# Patient Record
Sex: Male | Born: 1940 | Race: White | Hispanic: No | Marital: Married | Smoking: Former smoker
Health system: Southern US, Community
[De-identification: ages and names within clinical notes are randomized; demographics above are authoritative.]

## PROBLEM LIST (undated history)

## (undated) DIAGNOSIS — G4733 Obstructive sleep apnea (adult) (pediatric): Secondary | ICD-10-CM

## (undated) DIAGNOSIS — I10 Essential (primary) hypertension: Secondary | ICD-10-CM

## (undated) DIAGNOSIS — R3915 Urgency of urination: Secondary | ICD-10-CM

## (undated) DIAGNOSIS — F419 Anxiety disorder, unspecified: Secondary | ICD-10-CM

## (undated) DIAGNOSIS — Z950 Presence of cardiac pacemaker: Secondary | ICD-10-CM

## (undated) DIAGNOSIS — I495 Sick sinus syndrome: Secondary | ICD-10-CM

## (undated) DIAGNOSIS — E785 Hyperlipidemia, unspecified: Secondary | ICD-10-CM

## (undated) DIAGNOSIS — C801 Malignant (primary) neoplasm, unspecified: Secondary | ICD-10-CM

## (undated) DIAGNOSIS — K635 Polyp of colon: Secondary | ICD-10-CM

## (undated) DIAGNOSIS — I2510 Atherosclerotic heart disease of native coronary artery without angina pectoris: Secondary | ICD-10-CM

## (undated) DIAGNOSIS — M79606 Pain in leg, unspecified: Secondary | ICD-10-CM

## (undated) DIAGNOSIS — I739 Peripheral vascular disease, unspecified: Secondary | ICD-10-CM

## (undated) DIAGNOSIS — M1990 Unspecified osteoarthritis, unspecified site: Secondary | ICD-10-CM

## (undated) DIAGNOSIS — Z9989 Dependence on other enabling machines and devices: Secondary | ICD-10-CM

## (undated) DIAGNOSIS — D649 Anemia, unspecified: Secondary | ICD-10-CM

## (undated) DIAGNOSIS — E7800 Pure hypercholesterolemia, unspecified: Secondary | ICD-10-CM

## (undated) DIAGNOSIS — D489 Neoplasm of uncertain behavior, unspecified: Secondary | ICD-10-CM

## (undated) DIAGNOSIS — E119 Type 2 diabetes mellitus without complications: Secondary | ICD-10-CM

## (undated) HISTORY — PX: TONSILLECTOMY AND ADENOIDECTOMY: SUR1326

## (undated) HISTORY — PX: KNEE SURGERY: SHX244

## (undated) HISTORY — DX: Anemia, unspecified: D64.9

## (undated) HISTORY — PX: TUMOR EXCISION: SHX421

## (undated) HISTORY — DX: Anxiety disorder, unspecified: F41.9

## (undated) HISTORY — DX: Essential (primary) hypertension: I10

## (undated) HISTORY — DX: Pain in leg, unspecified: M79.606

## (undated) HISTORY — DX: Atherosclerotic heart disease of native coronary artery without angina pectoris: I25.10

## (undated) HISTORY — DX: Malignant (primary) neoplasm, unspecified: C80.1

## (undated) HISTORY — DX: Obstructive sleep apnea (adult) (pediatric): G47.33

## (undated) HISTORY — PX: FEMORAL ARTERY STENT: SHX1583

## (undated) HISTORY — DX: Pure hypercholesterolemia, unspecified: E78.00

## (undated) HISTORY — DX: Unspecified osteoarthritis, unspecified site: M19.90

## (undated) HISTORY — DX: Sick sinus syndrome: I49.5

## (undated) HISTORY — DX: Peripheral vascular disease, unspecified: I73.9

## (undated) HISTORY — PX: COLONOSCOPY: SHX174

## (undated) HISTORY — PX: FRACTURE SURGERY: SHX138

## (undated) HISTORY — PX: CORONARY ANGIOPLASTY: SHX604

## (undated) HISTORY — DX: Polyp of colon: K63.5

## (undated) HISTORY — DX: Hyperlipidemia, unspecified: E78.5

## (undated) HISTORY — DX: Obstructive sleep apnea (adult) (pediatric): Z99.89

## (undated) HISTORY — PX: KNEE HARDWARE REMOVAL: SUR1128

---

## 1971-04-11 HISTORY — PX: FOOT FRACTURE SURGERY: SHX645

## 2002-06-09 HISTORY — PX: CARDIAC CATHETERIZATION: SHX172

## 2002-06-09 HISTORY — PX: CORONARY ARTERY BYPASS GRAFT: SHX141

## 2002-06-20 ENCOUNTER — Inpatient Hospital Stay (HOSPITAL_COMMUNITY): Admission: EM | Admit: 2002-06-20 | Discharge: 2002-06-30 | Payer: Self-pay | Admitting: Emergency Medicine

## 2002-06-22 ENCOUNTER — Encounter: Payer: Self-pay | Admitting: Cardiovascular Disease

## 2002-06-24 ENCOUNTER — Encounter: Payer: Self-pay | Admitting: Thoracic Surgery (Cardiothoracic Vascular Surgery)

## 2002-06-25 ENCOUNTER — Encounter: Payer: Self-pay | Admitting: Thoracic Surgery (Cardiothoracic Vascular Surgery)

## 2002-06-26 ENCOUNTER — Encounter: Payer: Self-pay | Admitting: Thoracic Surgery (Cardiothoracic Vascular Surgery)

## 2002-06-27 ENCOUNTER — Encounter: Payer: Self-pay | Admitting: Thoracic Surgery (Cardiothoracic Vascular Surgery)

## 2002-07-21 ENCOUNTER — Encounter (HOSPITAL_COMMUNITY): Admission: RE | Admit: 2002-07-21 | Discharge: 2002-10-19 | Payer: Self-pay | Admitting: Cardiovascular Disease

## 2002-07-24 ENCOUNTER — Encounter: Admission: RE | Admit: 2002-07-24 | Discharge: 2002-07-24 | Payer: Self-pay | Admitting: Cardiothoracic Surgery

## 2002-07-24 ENCOUNTER — Encounter: Payer: Self-pay | Admitting: Cardiothoracic Surgery

## 2002-10-30 ENCOUNTER — Encounter: Payer: Self-pay | Admitting: Cardiovascular Disease

## 2002-10-30 ENCOUNTER — Ambulatory Visit (HOSPITAL_COMMUNITY): Admission: RE | Admit: 2002-10-30 | Discharge: 2002-10-31 | Payer: Self-pay | Admitting: Cardiovascular Disease

## 2004-02-17 ENCOUNTER — Ambulatory Visit: Payer: Self-pay | Admitting: Family Medicine

## 2004-02-25 ENCOUNTER — Ambulatory Visit: Payer: Self-pay | Admitting: Family Medicine

## 2004-06-15 ENCOUNTER — Ambulatory Visit: Payer: Self-pay | Admitting: Family Medicine

## 2005-10-09 ENCOUNTER — Encounter: Admission: RE | Admit: 2005-10-09 | Discharge: 2005-10-09 | Payer: Self-pay | Admitting: *Deleted

## 2005-10-13 ENCOUNTER — Ambulatory Visit (HOSPITAL_COMMUNITY): Admission: RE | Admit: 2005-10-13 | Discharge: 2005-10-14 | Payer: Self-pay | Admitting: *Deleted

## 2005-10-13 HISTORY — PX: INSERT / REPLACE / REMOVE PACEMAKER: SUR710

## 2006-11-26 DIAGNOSIS — I10 Essential (primary) hypertension: Secondary | ICD-10-CM | POA: Insufficient documentation

## 2006-11-26 DIAGNOSIS — E118 Type 2 diabetes mellitus with unspecified complications: Secondary | ICD-10-CM | POA: Insufficient documentation

## 2006-11-26 DIAGNOSIS — Z794 Long term (current) use of insulin: Secondary | ICD-10-CM

## 2009-07-15 ENCOUNTER — Encounter (HOSPITAL_COMMUNITY): Admission: RE | Admit: 2009-07-15 | Discharge: 2009-09-08 | Payer: Self-pay | Admitting: Cardiovascular Disease

## 2009-09-29 ENCOUNTER — Encounter: Admission: RE | Admit: 2009-09-29 | Discharge: 2009-09-29 | Payer: Self-pay | Admitting: Family Medicine

## 2010-01-31 ENCOUNTER — Ambulatory Visit: Payer: Self-pay | Admitting: Family Medicine

## 2010-01-31 DIAGNOSIS — S86819A Strain of other muscle(s) and tendon(s) at lower leg level, unspecified leg, initial encounter: Secondary | ICD-10-CM | POA: Insufficient documentation

## 2010-01-31 DIAGNOSIS — G4733 Obstructive sleep apnea (adult) (pediatric): Secondary | ICD-10-CM | POA: Insufficient documentation

## 2010-01-31 DIAGNOSIS — S838X9A Sprain of other specified parts of unspecified knee, initial encounter: Secondary | ICD-10-CM | POA: Insufficient documentation

## 2010-02-02 ENCOUNTER — Encounter: Payer: Self-pay | Admitting: Family Medicine

## 2010-02-15 ENCOUNTER — Encounter: Admission: RE | Admit: 2010-02-15 | Discharge: 2010-02-15 | Payer: Self-pay | Admitting: Sports Medicine

## 2010-02-16 ENCOUNTER — Encounter: Payer: Self-pay | Admitting: Family Medicine

## 2010-02-17 ENCOUNTER — Telehealth: Payer: Self-pay | Admitting: Family Medicine

## 2010-02-25 ENCOUNTER — Telehealth: Payer: Self-pay | Admitting: Family Medicine

## 2010-03-01 ENCOUNTER — Encounter: Payer: Self-pay | Admitting: Family Medicine

## 2010-04-07 ENCOUNTER — Encounter: Payer: Self-pay | Admitting: Family Medicine

## 2010-04-27 ENCOUNTER — Ambulatory Visit (HOSPITAL_COMMUNITY)
Admission: RE | Admit: 2010-04-27 | Discharge: 2010-04-27 | Payer: Self-pay | Source: Home / Self Care | Attending: Cardiovascular Disease | Admitting: Cardiovascular Disease

## 2010-05-10 ENCOUNTER — Ambulatory Visit
Admission: RE | Admit: 2010-05-10 | Discharge: 2010-05-10 | Payer: Self-pay | Source: Home / Self Care | Attending: Vascular Surgery | Admitting: Vascular Surgery

## 2010-05-10 NOTE — Assessment & Plan Note (Signed)
Summary: NOV needs sleep study   Vital Signs:  Patient profile:   70 year old male Height:      67 inches Weight:      197 pounds BMI:     30.97 O2 Sat:      96 % on Room air Temp:     98.8 degrees F oral Pulse rate:   90 / minute BP sitting:   160 / 91  (left arm) Cuff size:   regular  Vitals Entered By: Payton Spark CMA (January 31, 2010 1:45 PM)  O2 Flow:  Room air CC: New to est. Discuss Sleep apnea and L upper thigh pain after excercise   Primary Care Provider:  Seymour Bars DO  CC:  New to est. Discuss Sleep apnea and L upper thigh pain after excercise.  History of Present Illness: 70 yo WM presents for NOV.  He is due for a sleep study to renew his CPAP equipment. He has PVD.  He had CABG x 5  in 04.  Since then, he has had a PVD doppler u/s annually.  He is not due yet but he has been having pain in the L thigh.  This is apprently where he has had a stent in the past.  He was following with Dr Allyson Sabal but he is out of network on his insurance.  He has well controlled T2DM, on Metformin alone. His microalbuminuria resolved with change to Benicar.  He has HTN - on Verapamil, Benicar/ HCTZ and Amlodopine.  His meds were changed due to insurance coverage this year.    Dr Earlene Plater (the urologist) started him on Testim Gel which did help raise his testosterone and did help him feel a little better.  he has been on it for 2 mos.  He is still only at 311 on his last set of labs.         Current Medications (verified): 1)  Lovastatin 40 Mg Tabs (Lovastatin) .... Take 1 Tab By Mouth Every Morning and 1 Tab By Mouth At Bedtime 2)  Adult Aspirin Ec Low Strength 81 Mg  Tbec (Aspirin) 3)  Benicar Hct 40-25 Mg Tabs (Olmesartan Medoxomil-Hctz) .... Take 1 Tab By Mouth Once Daily 4)  Amlodipine Besylate 10 Mg Tabs (Amlodipine Besylate) .... Take 1/2 Tab By Mouth Once Daily 5)  Clonidine Hcl 0.3 Mg Tabs (Clonidine Hcl) .... Take 1 Tab By Mouth Every Morning and 1 Tab By Mouth  Every Evening 6)  Verapamil Hcl Cr 180 Mg Cr-Tabs (Verapamil Hcl) .... Take 1 Tab By Mouth Once Daily 7)  Ranitidine Hcl 150 Mg Tabs (Ranitidine Hcl) .... Take 1 Tab By Mouth Two Times A Day 8)  Metformin Hcl 500 Mg Tabs (Metformin Hcl) .... Take 1 Tab By Mouth Two Times A Day 9)  Multivitamins  Tabs (Multiple Vitamin) 10)  Vitamin C 11)  B 12 Complex 12)  Glucosamine Hcl 1500 13)  Otc Sleep Aid .... As Needed 14)  Extra Strength Acetaminophen 500 Mg Caps (Acetaminophen) 15)  Fish Oil  Allergies (verified): 1)  ! * Altace 2)  ! Cardizem 3)  ! * Peanuts  Past History:  Past Medical History: Arthritis Heart Disease High Cholesterol Diabetes mellitus, type II Hypertension OSA, on CPAP since 03  Review of Systems       no fevers/sweats/weakness, unexplained wt loss/gain, no change in vision, no difficulty hearing, ringing in ears, no hay fever/allergies, no CP/discomfort, no palpitations, no breast lump/nipple discharge, no cough/wheeze, no blood in  stool,no  N/V/D, no nocturia, no leaking urine, no unusual vag bleeding, no vaginal/penile discharge, no muscle/joint pain, no rash, no new/changing mole, no HA, no memory loss, no anxiety, no sleep problem, no depression, no unexplained lumps, no easy bruising/bleeding, no concern with sexual function   Physical Exam  General:  alert, well-developed, well-nourished, and well-hydrated.   Head:  normocephalic and atraumatic.   Mouth:  pharynx pink and moist.   Neck:  no masses.   Lungs:  Normal respiratory effort, chest expands symmetrically. Lungs are clear to auscultation, no crackles or wheezes. Heart:  Normal rate and regular rhythm. S1 and S2 normal without gallop, murmur, click, rub or other extra sounds. Msk:  reproducible pain with resisted leg adduction.  no pooint tenderness over the R quad Pulses:  2+ femoral and pedal pulses Extremities:  no LE edema Neurologic:  gait normal.   Skin:  color normal.     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  He had one 8 yrs ago and needs new CPAP equipment.    Orders: Sleep Disorder Referral (Sleep Disorder)  Problem # 2:  HYPERTENSION (ICD-401.9) BP high, possibly due to needing CPAP titrated.  Continue current meds for now.  Seeing Dr Allyson Sabal. The following medications were removed from the medication list:    Lotrel 10-20 Mg Caps (Amlodipine besy-benazepril hcl)    Lopressor Hct 50-25 Mg Tabs (Metoprolol-hydrochlorothiazide)    Avalide 300-25 Mg Tabs (Irbesartan-hydrochlorothiazide) His updated medication list for this problem includes:    Benicar Hct 40-25 Mg Tabs (Olmesartan medoxomil-hctz) .[name]... Take 1 tab by mouth once daily    Amlodipine Besylate 10 Mg Tabs (Amlodipine besylate) .[name]... Take 1/2 tab by mouth once daily    Clonidine Hcl 0.3 Mg Tabs (Clonidine hcl) .[name]... Take 1 tab by mouth every morning and 1 tab by mouth every evening    Verapamil Hcl Cr 180 Mg Cr-tabs (Verapamil hcl) .[name]... Take 1 tab by mouth once daily  Problem # 3:  SPRAIN&STRAIN OTHER SPECIFIED SITES KNEE&LEG (ICD-844.8) L quad strain by exam findings likely due to lifiting heavy wts at the gym 5 mos ago.  He has not improved with cessation of wt lifting for 5 mos.  He may have a partial tear medially.  I will ask sports med to see him.  May need sports u/s to look for tear. Orders: Sports Medicine (Sports Med)  Problem # 4:  DIABETES MELLITUS, TYPE II (ICD-250.00) He recently had a full DM check up with Dr Drue Second.  Will obtain records. The following medications were removed from the medication list:    Lotrel 10-20 Mg Caps (Amlodipine besy-benazepril hcl)    Avalide 300-25 Mg Tabs (Irbesartan-hydrochlorothiazide) His updated medication list for this problem includes:    Adult Aspirin Ec Low Strength 81 Mg Tbec (Aspirin)    Benicar Hct 40-25 Mg Tabs (Olmesartan medoxomil-hctz) .[name]... Take 1 tab by mouth once daily     Metformin Hcl 500 Mg Tabs (Metformin hcl) .[name]... Take 1 tab by mouth two times a day  Complete Medication List: 1)  Lovastatin 40 Mg Tabs (Lovastatin) .... Take 1 tab by mouth every morning and 1 tab by mouth at bedtime 2)  Adult Aspirin Ec Low Strength 81 Mg Tbec (Aspirin) 3)  Benicar Hct 40-25 Mg Tabs (Olmesartan medoxomil-hctz) .... Take 1 tab by mouth once daily 4)  Amlodipine Besylate 10 Mg Tabs (Amlodipine besylate) .... Take 1/2 tab by  mouth once daily 5)  Clonidine Hcl 0.3 Mg Tabs (Clonidine hcl) .... Take 1 tab by mouth every morning and 1 tab by mouth every evening 6)  Verapamil Hcl Cr 180 Mg Cr-tabs (Verapamil hcl) .... Take 1 tab by mouth once daily 7)  Ranitidine Hcl 150 Mg Tabs (Ranitidine hcl) .... Take 1 tab by mouth two times a day 8)  Metformin Hcl 500 Mg Tabs (Metformin hcl) .... Take 1 tab by mouth two times a day 9)  Multivitamins Tabs (Multiple vitamin) 10)  Vitamin C  11)  B 12 Complex  12)  Glucosamine Hcl 1500  13)  Otc Sleep Aid  .... As needed 14)  Extra Strength Acetaminophen 500 Mg Caps (Acetaminophen) 15)  Fish Oil   Patient Instructions: 1)  Stay on Testim and f/u dosage increase with Dr Earlene Plater. 2)  Will get your sleep study scheduled at [facility] (if covered here). 3)  Will get you in with sports med downstairs to rule out a quad tear/ strain. 4)  Return for follow up BP in 2 mos (either here or with Dr  Drue Second)   Orders Added: 1)  Sleep Disorder Referral [Sleep Disorder] 2)  Sports Medicine [Sports Med] 3)  New Patient Level III [24401]

## 2010-05-10 NOTE — Consult Note (Signed)
Summary: [facility]  [facility]   Imported By: Lanelle Bal 02/21/2010 08:24:03  _____________________________________________________________________  External Attachment:    Type:   Image     Comment:   External Document

## 2010-05-10 NOTE — Progress Notes (Signed)
Summary: Dr. Cindie Crumbly needs a order for C-Pap titration  Phone Note From Other Clinic   Caller: Receptionist Call For: Dr.Bowen Summary of Call: [facility]- Neuro Sleep Study ( Dr[facility] ) Needs a order for a C- pap titration Faxed to [phone], pt is scheduled for Tuesday Nov.22nd Initial call taken by: Michaelle Copas,  February 25, 2010 9:00 AM  Follow-up for Phone Call        Order printed.  Follow-up by: Nani Gasser MD,  February 25, 2010 9:10 AM  Additional Follow-up for Phone Call Additional follow up Details #1::        Faxed order to Dr. Cindie Crumbly office Additional Follow-up by: Kathlene November LPN,  February 25, 2010 9:17 AM     Appended Document: Dr. Cindie Crumbly needs a order for C-Pap titration Order for CPAP placed.  Fax to [facility] at Dr Marcy Panning office [phone] and remind pt that he needs re-eval with Dr Gaetano Net after 8-12 wks.  Seymour Bars, D.O.  Appended Document: Dr. Cindie Crumbly needs a order for C-Pap titration order faxed

## 2010-05-10 NOTE — Consult Note (Signed)
Summary: [facility]  [facility]   Imported By: Lanelle Bal 03/07/2010 10:52:06  _____________________________________________________________________  External Attachment:    Type:   Image     Comment:   External Document

## 2010-05-10 NOTE — Progress Notes (Signed)
Summary: cpap titration  Phone Note From Other Clinic   Caller: Receptionist Summary of Call: Denyse Amass from Dr. Marcy Panning office called and states that Dr. Gaetano Net is reccomending a C-Pap titration for this patient, Will you send them a order for this if you agree.... Thanks.Michaelle Copas  February 17, 2010 3:54 PM  Initial call taken by: Michaelle Copas,  February 17, 2010 3:54 PM  Follow-up for Phone Call        sure thing.  Do they have a paper order to complete or just need it on RX? Follow-up by: Seymour Bars DO,  February 17, 2010 4:15 PM  Additional Follow-up for Phone Call Additional follow up Details #1::        I assume you can just send a Rx. order beacuse she didn't mention you feeling out a paper from their office... She just said to send the order to her... Thanks.Michaelle Copas  February 17, 2010 4:23 PM  Additional Follow-up by: Michaelle Copas,  February 17, 2010 4:23 PM    New/Updated Medications: * C-PAP TITRATION dx OSA Prescriptions: C-PAP TITRATION dx OSA  #1 x 0   Entered and Authorized by:   Seymour Bars DO   Signed by:   Seymour Bars DO on 02/18/2010   Method used:   Printed then faxed to ...       [address] [phone]* (retail)       [address] [place], [place]  [address]       Ph: [phone]       Fax: [phone]   RxID:   [id number]

## 2010-05-11 ENCOUNTER — Encounter: Payer: Self-pay | Admitting: Cardiovascular Disease

## 2010-05-11 NOTE — Consult Note (Addendum)
NEW PATIENT CONSULTATION  Robert Burns, Robert Burns DOB:  02/15/1941                                       05/10/2010 CHART#:[id number]  This is a 70 year old male patient referred by Dr. Nanetta Batty for a "mass" in the left thigh.  This patient began having pain in the left thigh about 1 year ago and when his pet cat touches his leg it causes severe pain he states.  He has been noted to have a "mass" around his left superficial femoral artery on duplex scanning at [facility] in the past and a CT angiogram performed at [facility] in January 2012 which I have reviewed today thoroughly by computer. 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  The patient also has a remote history of PTA and stenting of his left superficial femoral artery distally by Dr. Allyson Sabal in 2004, which has remained patent.  The patient denies any claudication symptoms.  CHRONIC MEDICAL PROBLEMS: 1. Diabetes. 2. Hypertension. 3. Hyperlipidemia. 4. Coronary artery disease previous coronary bypass grafting in 2004,     using saphenous vein from right leg. 5. Sleep apnea, uses CPAP. 6. Negative for COPD or stroke.  SOCIAL HISTORY:  The patient is married, has two children,  is retired. Has not used tobacco in 37 years.  Does not use alcohol.  FAMILY HISTORY:  Positive for coronary artery disease in his mother; diabetes in the father's side of the family.  Negative for stroke.  REVIEW OF SYSTEMS:  Negative for chest pain, dyspnea on exertion.  Does have leg discomfort with walking which are not limiting, arthritis joint pain, muscle pain, urinary frequency.  All other systems in a  complete review of systems are negative.  PHYSICAL EXAMINATION:  Blood pressure 116/71, heart rate 96, respirations 16.  General:  He is a well-developed, well-nourished male who is  in no apparent distress, alert and oriented x3.  HEENT:  Normal for age.  EOMs intact.  Lungs:  Clear to auscultation.  No rhonchi or wheezing.  Cardiovascular:  Regular rhythm.  No murmurs.  Carotid pulses 3+.  No audible bruits.  Abdomen:  Soft, nontender with no masses. Musculoskeletal:  Free of major deformities.  Neurologic:  Normal. Skin:  Free of rashes.  Lower extremity:  Exam reveals 3+ femoral, popliteal, and dorsalis pedis pulses bilaterally.  The patient complains of pain to deep palpation in the proximal to mid thigh on the left side. No mass is palpable.  I examined this independently with the SonoSite ultrasound today and there is clearly a very focal aneurysm in the proximal to mid SFA with a widely patent artery proximal and distal to this point.  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  I think this should be treated surgically because of the local pain the patient is experiencing.  It could be treated with a covered stent but the mass effect would still be present and is probably pushing on a nerve in this area.  Will need to get a formal angiogram by Dr. Allyson Sabal to assess the distal SFA and determine whether a femoral popliteal graft to be performed versus local resection of the aneurysm with an interposition graft possibly  preserving saphenous vein for later use.  I will discuss this further with Dr. Allyson Sabal and we will get in touch with the patient.    Robert Burns Rochester, M.D. Electronically Signed  JDL/MEDQ  D:  05/10/2010  T:  05/11/2010  Job:  [id number]  cc:   Dalbert Mayotte, M.D. Nanetta Batty, M.D.

## 2010-05-12 NOTE — Letter (Signed)
Summary: CMN for CPAP/[facility]  CMN for CPAP/[facility]   Imported By: Lanelle Bal 04/20/2010 10:20:52  _____________________________________________________________________  External Attachment:    Type:   Image     Comment:   External Document

## 2010-05-16 ENCOUNTER — Encounter: Payer: Self-pay | Admitting: Surgery

## 2010-05-16 ENCOUNTER — Ambulatory Visit
Admission: RE | Admit: 2010-05-16 | Discharge: 2010-05-16 | Disposition: A | Discharge status: Home / Self Care | Payer: Medicare HMO | Source: Ambulatory Visit | Attending: Cardiovascular Disease | Admitting: Cardiovascular Disease

## 2010-05-16 ENCOUNTER — Other Ambulatory Visit: Payer: Self-pay | Admitting: Cardiovascular Disease

## 2010-05-16 DIAGNOSIS — I10 Essential (primary) hypertension: Secondary | ICD-10-CM

## 2010-05-20 ENCOUNTER — Ambulatory Visit (HOSPITAL_COMMUNITY)
Admission: RE | Admit: 2010-05-20 | Discharge: 2010-05-20 | Disposition: A | Discharge status: Home / Self Care | Payer: Medicare HMO | Source: Ambulatory Visit | Attending: Cardiovascular Disease | Admitting: Cardiovascular Disease

## 2010-05-20 DIAGNOSIS — I70209 Unspecified atherosclerosis of native arteries of extremities, unspecified extremity: Secondary | ICD-10-CM | POA: Insufficient documentation

## 2010-05-20 DIAGNOSIS — M79609 Pain in unspecified limb: Secondary | ICD-10-CM | POA: Insufficient documentation

## 2010-05-20 LAB — GLUCOSE, CAPILLARY: Glucose-Capillary: 172 mg/dL — ABNORMAL HIGH (ref 70–99)

## 2010-05-30 ENCOUNTER — Encounter: Payer: Self-pay | Admitting: Family Medicine

## 2010-05-31 ENCOUNTER — Encounter (HOSPITAL_COMMUNITY)
Admission: RE | Admit: 2010-05-31 | Discharge: 2010-05-31 | Disposition: A | Discharge status: Home / Self Care | Payer: Medicare HMO | Source: Ambulatory Visit | Attending: Vascular Surgery | Admitting: Vascular Surgery

## 2010-05-31 DIAGNOSIS — Z01812 Encounter for preprocedural laboratory examination: Secondary | ICD-10-CM | POA: Insufficient documentation

## 2010-05-31 LAB — URINALYSIS, ROUTINE W REFLEX MICROSCOPIC
Protein, ur: 100 mg/dL — AB
Specific Gravity, Urine: 1.029 (ref 1.005–1.030)
Urine Glucose, Fasting: >1000 mg/dL — AB
Urobilinogen, UA: 0.2 mg/dL (ref 0.0–1.0)

## 2010-05-31 LAB — COMPREHENSIVE METABOLIC PANEL
ALT: 40 U/L (ref 0–53)
Calcium: 10 mg/dL (ref 8.4–10.5)
GFR calc non Af Amer: >60 mL/min (ref >60)
Total Bilirubin: 0.6 mg/dL (ref 0.3–1.2)
Total Protein: 7 g/dL (ref 6.0–8.3)

## 2010-05-31 LAB — URINE MICROSCOPIC-ADD ON

## 2010-05-31 LAB — CBC
MCV: 86 fL (ref 78.0–100.0)
WBC: 9.8 10*3/uL (ref 4.0–10.5)

## 2010-05-31 LAB — SURGICAL PCR SCREEN
MRSA, PCR: NEGATIVE
Staphylococcus aureus: POSITIVE — AB

## 2010-05-31 LAB — PROTIME-INR
INR: 0.98 (ref 0.00–1.49)
Prothrombin Time: 13.2 seconds (ref 11.6–15.2)

## 2010-05-31 LAB — APTT: aPTT: 29 seconds (ref 24–37)

## 2010-06-01 NOTE — Procedures (Signed)
NAME:  Robert Burns, BAYRON NO.:  [id number]  MEDICAL RECORD NO.:  [id number]           PATIENT TYPE:  O  LOCATION:  [facility]                         FACILITY:  [facility]  PHYSICIAN:  Nanetta Batty, M.D.   DATE OF BIRTH:  Aug 16, 1940  DATE OF PROCEDURE: DATE OF DISCHARGE:  05/20/2010                   PERIPHERAL VASCULAR INVASIVE PROCEDURE   Mr. Maggio is a 70 year old mildly overweight married Caucasian male, father of 2, grandfather of 3 grandchildren.  His wife, [name] is also a patient of mine.  He has a history of CAD status post bypass grafting in March 2004 with LIMA to his LAD, vein graft to the diagonal branch, sequential vein to ramus and OM as well as vein to the PDA.  Last functional study performed on July 15, 2009, was nonischemic.  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  Denies chest pressure, shortness of breath.  The patient's other problems include hypertension and hyperlipidemia.  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 He has been complaining of pain in his left thigh.  CT angiogram done suggested an aneurysm in juxtaposition to the proximal left SFA.  Duplex showed this could be the case as well with some mild color flow suggesting vascularity.  His ABIs have been preserved.  Symptoms are really not claudication.  He presents now for angiography to define his anatomy prior to potential surgical evaluation.  PROCEDURE DESCRIPTION:  The patient was brought to the Second Floor [facility] Angiographic Suite in the post absorptive state.  He was premedicated with p.o. Valium.  His right groin was prepped and shaved in usual sterile fashion.  Xylocaine 1% was used for local anesthesia. A 5-French sheath was inserted into the right femoral artery using standard Seldinger technique.  A 5-French tennis racquet catheter was used for midstream and distal abdominal  aortography with bifemoral runoff using bolus chase digital subtraction step-table technique. Visipaque dye was used for the entirety of the case.  Retrograde aortic pressure was monitored during the case.  HEMODYNAMICS:  Aortic systolic pressure 150, diastolic pressure is 57.  ANGIOGRAPHIC RESULTS: 1. Abdominal aorta.     a.     Renal arteries - normal.     b.     Infrarenal abdominal aorta - normal. 2. Left lower extremity;     a.     Patent left SFA stent with three-vessel runoff.     b.     No evidence of aneurysmal dilatation or communication in the      proximal left SFA. 3. Right lower extremity;     a.     50-60% segmental mid right SFA stenosis.     b.     Three-vessel runoff.  IMPRESSION:  Mr. Laster has a patent stent in his mid to distal left superficial femoral artery.  I cannot demonstrate an area of aneurysmal dilatation in the left superficial femoral artery nor there is communication with any vascular structure in that area.  I communicated this with Dr. Hart Rochester.  Sheath was removed and pressure was applied on the  groin to achieve hemostasis.  The patient left the lab in stable condition.  He will be discharged home later today as an outpatient.  We will see him back in the office in 1 or 2 weeks for followup.  Dr. Hart Rochester will see him in Short Stay prior to discharge.  He left the lab in stable condition.     Nanetta Batty, M.D.     JB/MEDQ  D:  05/20/2010  T:  05/20/2010  Job:  [id number]  cc:   Second [facility] angiographic Suite [facility] Carmon Ginsberg D. Hart Rochester, M.D.  Electronically Signed by Nanetta Batty M.D. on 06/01/2010 11:34:51 AM

## 2010-06-02 ENCOUNTER — Other Ambulatory Visit: Payer: Self-pay | Admitting: Vascular Surgery

## 2010-06-02 ENCOUNTER — Inpatient Hospital Stay (HOSPITAL_COMMUNITY)
Admission: RE | Admit: 2010-06-02 | Discharge: 2010-06-04 | DRG: 517 | Disposition: A | Discharge status: Home / Self Care | Payer: Medicare HMO | Source: Ambulatory Visit | Attending: Vascular Surgery | Admitting: Vascular Surgery

## 2010-06-02 DIAGNOSIS — D481 Neoplasm of uncertain behavior of connective and other soft tissue: Principal | ICD-10-CM | POA: Diagnosis present

## 2010-06-02 DIAGNOSIS — E785 Hyperlipidemia, unspecified: Secondary | ICD-10-CM | POA: Diagnosis present

## 2010-06-02 DIAGNOSIS — Z7982 Long term (current) use of aspirin: Secondary | ICD-10-CM

## 2010-06-02 DIAGNOSIS — I10 Essential (primary) hypertension: Secondary | ICD-10-CM | POA: Diagnosis present

## 2010-06-02 DIAGNOSIS — Z951 Presence of aortocoronary bypass graft: Secondary | ICD-10-CM

## 2010-06-02 DIAGNOSIS — E119 Type 2 diabetes mellitus without complications: Secondary | ICD-10-CM | POA: Diagnosis present

## 2010-06-02 DIAGNOSIS — I2510 Atherosclerotic heart disease of native coronary artery without angina pectoris: Secondary | ICD-10-CM | POA: Diagnosis present

## 2010-06-02 DIAGNOSIS — D4819 Other specified neoplasm of uncertain behavior of connective and other soft tissue: Principal | ICD-10-CM | POA: Diagnosis present

## 2010-06-02 DIAGNOSIS — I739 Peripheral vascular disease, unspecified: Secondary | ICD-10-CM | POA: Diagnosis present

## 2010-06-02 DIAGNOSIS — G4733 Obstructive sleep apnea (adult) (pediatric): Secondary | ICD-10-CM | POA: Diagnosis present

## 2010-06-02 DIAGNOSIS — I7389 Other specified peripheral vascular diseases: Secondary | ICD-10-CM

## 2010-06-02 HISTORY — PX: TUMOR EXCISION: SHX421

## 2010-06-02 LAB — GLUCOSE, CAPILLARY
Glucose-Capillary: 188 mg/dL — ABNORMAL HIGH (ref 70–99)
Glucose-Capillary: 192 mg/dL — ABNORMAL HIGH (ref 70–99)
Glucose-Capillary: 160 mg/dL — ABNORMAL HIGH (ref 70–99)
Glucose-Capillary: 266 mg/dL — ABNORMAL HIGH (ref 70–99)

## 2010-06-03 DIAGNOSIS — Z48812 Encounter for surgical aftercare following surgery on the circulatory system: Secondary | ICD-10-CM

## 2010-06-03 DIAGNOSIS — I739 Peripheral vascular disease, unspecified: Secondary | ICD-10-CM

## 2010-06-03 LAB — CBC
Hemoglobin: 13.2 g/dL (ref 13.0–17.0)
MCV: 86.9 fL (ref 78.0–100.0)
Platelets: 165 10*3/uL (ref 150–400)
RBC: 4.59 MIL/uL (ref 4.22–5.81)
RDW: 14.7 % (ref 11.5–15.5)
WBC: 11.3 10*3/uL — ABNORMAL HIGH (ref 4.0–10.5)

## 2010-06-03 LAB — BASIC METABOLIC PANEL
BUN: 10 mg/dL (ref 6–23)
CO2: 31 mEq/L (ref 19–32)
Calcium: 9.1 mg/dL (ref 8.4–10.5)
Chloride: 101 mEq/L (ref 96–112)
Creatinine, Ser: 0.92 mg/dL (ref 0.4–1.5)
GFR calc Af Amer: >60 mL/min (ref >60)

## 2010-06-03 LAB — GLUCOSE, CAPILLARY
Glucose-Capillary: 183 mg/dL — ABNORMAL HIGH (ref 70–99)
Glucose-Capillary: 184 mg/dL — ABNORMAL HIGH (ref 70–99)

## 2010-06-04 LAB — CROSSMATCH
ABO/RH(D): O POS
Antibody Screen: NEGATIVE
Unit division: 0

## 2010-06-04 LAB — GLUCOSE, CAPILLARY: Glucose-Capillary: 176 mg/dL — ABNORMAL HIGH (ref 70–99)

## 2010-06-06 NOTE — Discharge Summary (Addendum)
NAME:  Robert Burns, Robert Burns NO.:  [id number]  MEDICAL RECORD NO.:  [id number]           PATIENT TYPE:  I  LOCATION:  2032                         FACILITY:  [facility]  PHYSICIAN:  Quita Skye. Hart Rochester, M.D.  DATE OF BIRTH:  March 10, 1941  DATE OF ADMISSION:  06/02/2010 DATE OF DISCHARGE:  06/04/2010                              DISCHARGE SUMMARY   CHIEF COMPLAINT:  Left thigh mass.  HISTORY OF PRESENT ILLNESS:  Robert Burns is a 70 year old gentleman referred by Dr. Allyson Sabal for a mass in the left thigh.  The patient has been having pain in the left thigh for about a year, and whenever anything touches it, it causes severe pain.  He was noted to have mass around his left superficial femoral artery on duplex scan and a CT revealed left superficial femoral artery mass, possible aneurysm of 2.1 cm in the proximal thigh consistent with where the patient's pain is located.  He is being admitted for resection of the mass.  CHRONIC MEDICAL ISSUES: 1. Diabetes. 2. Hypertension. 3. Hyperlipidemia. 4. Coronary artery disease with bypass grafting 2004. 5. Sleep apnea, using CPAP. 6. Negative for COPD or stroke.  HOSPITAL COURSE:  The patient was taken to the operating room on June 02, 2010, for resection of left superficial femoral artery with mass with insertion of an interposition 6-mm Gore-Tex graft.  He also had excision of adjacent lymphatics with mass material attached.  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  He is being sent for permanent section and further evaluation.  Postoperatively, the patient did well.  He was ambulating, voiding, and taking p.o.  His wounds were healing well.  He did have some numbness around the knee as the femoral nerve was laid over this mass and had to be dissected off the mass.  He notes he has some thigh and knee sharp shooting pains at times while he is ambulating since the surgery.  Otherwise,  all wounds are healing well and he has palpable pulses in both his lower extremities, and he will be discharged to home on June 04, 2010.  FINAL DIAGNOSES: 1. 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 2. Left femoral nerve pain secondary to dissection of the nerve from     around the mass. 3. All his chronic medical issues were stable and treated with his     normal medications as he takes at home.  DISCHARGE MEDICATIONS: 1. Oxycodone 5-10 mg every 4 hours as needed for pain. 2. Amlodipine 10 mg daily. 3. Aspirin 81 mg daily. 4. Clonidine 0.3 mg twice daily. 5. Vitamin D daily. 6. Fish oil 500 mg 3 capsules twice daily. 7. Glucosamine daily. 8. Lovastatin 40 mg twice daily. 9. Metformin 1000 mg twice daily. 10.Toprol 25 mg twice daily. 11.Ranitidine 150 mg twice daily. 12.Over-the-counter sleeping aid as needed at bedtime for sleep. 13.Tylenol Extra Strength for pain 2 tablets her 4 hours as needed. 14.He is off pain medication, gabapentin, 300 mg 1 tablet 2 times a    day.  A new prescription was given for that as well, and vitamin C     over the counter.     Della Goo, PA-C   ______________________________ [name], M.D.    RR/MEDQ  D:  06/04/2010  T:  06/04/2010  Job:  [id number]  cc:   Nanetta Batty, M.D.  Electronically Signed by Della Goo PA on 06/06/2010 01:07:43 PM Electronically Signed by Josephina Gip M.D. on 06/06/2010 04:14:38 PM

## 2010-06-06 NOTE — Op Note (Signed)
NAME:  Robert Burns, Robert Burns NO.:  [id number]  MEDICAL RECORD NO.:  [id number]           PATIENT TYPE:  I  LOCATION:  2032                         FACILITY:  [facility]  PHYSICIAN:  Quita Skye. Hart Rochester, M.D.  DATE OF BIRTH:  03/24/1941  DATE OF PROCEDURE:  06/02/2010 DATE OF DISCHARGE:                              OPERATIVE REPORT   PREOPERATIVE DIAGNOSIS:  Left superficial femoral artery mass, rule out tumor, rule out pseudoaneurysm.  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  OPERATION:  Resection of left superficial femoral artery with mass with insertion of an interposition 6-mm Gore-Tex graft plus excision of adjacent lymphatics.  SURGEON:  Quita Skye. Hart Rochester, MD  FIRST ASSISTANT:  Della Goo, PA-C  ANESTHESIA:  General endotracheal.  BRIEF HISTORY:  This patient was found on duplex scanning to have a small mass in approximately 4 x 2 cm involving the left superficial femoral artery and the proximal third of the thigh.  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  The patient was having significant pain associated with this with some radicular pain distally and it was scheduled for resection of this area including the mass.  PROCEDURE:  The patient was taken to the operating room, placed in supine position, at which time, satisfactory general endotracheal anesthesia was administered.  The left leg was prepped with Betadine scrub and solution draped in a routine sterile manner.  Using the SonoSite ultrasound, the exact location of this mass was marked, which was into the junction of the proximal and middle third of the thigh. Longitudinal incision was made anteriorly, carried down through the subcutaneous tissue.  The working between the muscle groups,  the neurovascular bundle was identified, proximal control of the superficial femoral artery was obtained.  It was a diffusely, but mildly diseased vessel with some plaque formation, but had an excellent pulse.  The mass itself was readily palpable and initially distal control of the artery was obtained.  The nerve laid adjacent an anterior to the mass along the lateral side and it was carefully dissected free avoiding injury to the nerve.  Following this, circumferential control of the mass was obtained.  It was a well-encapsulated mass with one area of involvement in adjacent lymphatic, which was later excised in toto and sent to the lab.  The patient was then heparinized and this entire segment of superficial femoral artery, which was approximately a 5-cm segment was excised.  The lumen itself was opened and this did not appear to affect the intraluminal surface that appeared to be in the wall of the artery. It was then sent for frozen section and it appeared that this was in the family of hemangioendothelioma some type of neoplastic process, but it was unclear exactly what the diagnosis was.  The proximal and distal margins were clear according to the frozen sections.  An interposition 6- mm Gore-Tex graft was then obtained and slightly spatulated.  The artery proximally and distally spatulated and end-to-end anastomoses was  done with 6-0 Prolene.  Clamps were then released.  There was an excellent pulse in the graft and palpable posterior tibial pulse at the ankle with good Doppler flow.  No protamine was given.  Adequate hemostasis was achieved.  The mass in all adjacent involved tissue had been completely removed and as noted, this was essentially entirely encapsulated with the exception of one area, which appeared to involve some lymphatics, which was removed.  Wound was then closed in layers with Vicryl in a subcuticular fashion with Dermabond.  The patient was taken to  the recovery room in stable condition.     [name], M.D.     JDL/MEDQ  D:  06/02/2010  T:  06/02/2010  Job:  [id number]  Electronically Signed by Josephina Gip M.D. on 06/06/2010 04:14:36 PM

## 2010-06-09 ENCOUNTER — Encounter: Payer: Self-pay | Admitting: Family Medicine

## 2010-06-14 ENCOUNTER — Ambulatory Visit (INDEPENDENT_AMBULATORY_CARE_PROVIDER_SITE_OTHER): Payer: Medicare HMO | Admitting: Vascular Surgery

## 2010-06-14 DIAGNOSIS — I7389 Other specified peripheral vascular diseases: Secondary | ICD-10-CM

## 2010-06-15 NOTE — Assessment & Plan Note (Signed)
OFFICE VISIT  Robert Burns, Robert Burns DOB:  [birth year]                                       06/14/2010 CHART#:[id number]  [name] is a 70 year old male who returns today for initial follow-up regarding his resection of a vascular tumor in his left thigh which I performed on February 23.  The tumor was about 3.5 cm in size and involved the arterial wall but not the luminal surface.  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  This is a vascular neoplasm with apparently a very low malignant potential.  There is no evidence of any spread of the tumor on the pathologic evaluation. He states that the severe pain he was experiencing in the left thigh preoperatively is now gone.  He does have some surgical pain and had some numbness down the leg which is resolving.  He is ambulating increasing distances.  He has had no chills, fever or rest pain in the left foot.  He does take 1 aspirin a day.  On physical exam blood pressure 131/87, heart rate 93, respirations 18. Lower extremity exam reveals 3+ femoral and 2+ posterior tibial pulse in the left leg.  Incision of the thigh is healing nicely with no evidence infection.  I think he is getting along nicely and I will refer him to Dr. Rolm Baptise for oncological evaluation to see if any further treatment is indicated on this unusual tumor.  He will return to see me in 3 months. We will perform a duplex scan of the area in the superficial femoral artery where the tumor was resected.  He is able to resume his normal activities at the beginning of next week.  If he has any further problems, he will be in touch with me in the interim.    [name], M.D. Electronically Signed  JDL/MEDQ  D:  06/14/2010  T:  06/15/2010  Job:  [id number]  cc:   Nanetta Batty, M.D. Quenton Fetter, M.D.

## 2010-06-16 NOTE — Consult Note (Signed)
Summary: [facility]  [facility]   Imported By: Lanelle Bal 06/09/2010 12:40:29  _____________________________________________________________________  External Attachment:    Type:   Image     Comment:   External Document

## 2010-06-28 NOTE — Letter (Signed)
Summary: [facility]  [facility]   Imported By: Maryln Gottron 06/20/2010 14:23:49  _____________________________________________________________________  External Attachment:    Type:   Image     Comment:   External Document

## 2010-07-01 ENCOUNTER — Encounter: Payer: Medicare HMO | Admitting: Oncology

## 2010-07-11 ENCOUNTER — Encounter (HOSPITAL_BASED_OUTPATIENT_CLINIC_OR_DEPARTMENT_OTHER): Payer: Medicare HMO | Admitting: Oncology

## 2010-07-11 DIAGNOSIS — D1800 Hemangioma unspecified site: Secondary | ICD-10-CM

## 2010-07-14 ENCOUNTER — Ambulatory Visit: Payer: Medicare HMO | Attending: Radiation Oncology | Admitting: Radiation Oncology

## 2010-07-14 DIAGNOSIS — D4819 Other specified neoplasm of uncertain behavior of connective and other soft tissue: Secondary | ICD-10-CM | POA: Insufficient documentation

## 2010-07-14 DIAGNOSIS — Z950 Presence of cardiac pacemaker: Secondary | ICD-10-CM | POA: Insufficient documentation

## 2010-07-14 DIAGNOSIS — D481 Neoplasm of uncertain behavior of connective and other soft tissue: Secondary | ICD-10-CM | POA: Insufficient documentation

## 2010-07-14 DIAGNOSIS — Z951 Presence of aortocoronary bypass graft: Secondary | ICD-10-CM | POA: Insufficient documentation

## 2010-07-14 DIAGNOSIS — I2510 Atherosclerotic heart disease of native coronary artery without angina pectoris: Secondary | ICD-10-CM | POA: Insufficient documentation

## 2010-07-14 DIAGNOSIS — I10 Essential (primary) hypertension: Secondary | ICD-10-CM | POA: Insufficient documentation

## 2010-07-14 DIAGNOSIS — G4730 Sleep apnea, unspecified: Secondary | ICD-10-CM | POA: Insufficient documentation

## 2010-07-14 DIAGNOSIS — E119 Type 2 diabetes mellitus without complications: Secondary | ICD-10-CM | POA: Insufficient documentation

## 2010-07-28 ENCOUNTER — Other Ambulatory Visit: Payer: Self-pay | Admitting: Radiation Oncology

## 2010-07-28 DIAGNOSIS — D489 Neoplasm of uncertain behavior, unspecified: Secondary | ICD-10-CM

## 2010-08-01 ENCOUNTER — Other Ambulatory Visit: Payer: Self-pay | Admitting: Radiation Oncology

## 2010-08-01 ENCOUNTER — Encounter: Payer: Medicare HMO | Admitting: Oncology

## 2010-08-01 LAB — BUN & CREATININE (CHCC)
BUN, Bld: 13 mg/dL (ref 7–22)
Creat: 0.9 mg/dl (ref 0.6–1.2)

## 2010-08-02 ENCOUNTER — Ambulatory Visit (INDEPENDENT_AMBULATORY_CARE_PROVIDER_SITE_OTHER): Payer: Medicare HMO | Admitting: Vascular Surgery

## 2010-08-02 ENCOUNTER — Encounter (INDEPENDENT_AMBULATORY_CARE_PROVIDER_SITE_OTHER): Payer: Medicare HMO

## 2010-08-02 DIAGNOSIS — Z48812 Encounter for surgical aftercare following surgery on the circulatory system: Secondary | ICD-10-CM

## 2010-08-02 DIAGNOSIS — D481 Neoplasm of uncertain behavior of connective and other soft tissue: Secondary | ICD-10-CM

## 2010-08-02 NOTE — Procedures (Unsigned)
LOWER EXTREMITY ARTERIAL DUPLEX  INDICATION:  Followup resection of vascular tumor in left thigh.  HISTORY: Diabetes:  Yes. Cardiac:  Yes. Hypertension:  Yes. Smoking:  Previous. Previous Surgery:  Interpositional graft of left SFA subsequent to tumor removal.  Prior left SFA stent.  SINGLE LEVEL ARTERIAL EXAM                         RIGHT                LEFT Brachial:               150                  146 Anterior tibial:        160                  142 Posterior tibial:       161                  141 Peroneal: Ankle/Brachial Index:   1.07                 0.95  LOWER EXTREMITY ARTERIAL DUPLEX EXAM  DUPLEX:  Widely patent left SFA interpositional graft. Mild to moderate diffuse disease is observed in the native vessel distal to the graft. Patent distal SFA stent with mild restenosis of the entry/proximal segment.  IMPRESSION:  Patent left lower extremity status post interventions as described above.  ___________________________________________ Robert Burns. Hart Rochester, M.D.  LT/MEDQ  D:  08/02/2010  T:  08/02/2010  Job:  [id number]

## 2010-08-03 ENCOUNTER — Other Ambulatory Visit (HOSPITAL_COMMUNITY): Payer: Medicare HMO

## 2010-08-03 ENCOUNTER — Ambulatory Visit (HOSPITAL_COMMUNITY)
Admission: RE | Admit: 2010-08-03 | Discharge: 2010-08-03 | Disposition: A | Discharge status: Home / Self Care | Payer: Medicare HMO | Source: Ambulatory Visit | Attending: Radiation Oncology | Admitting: Radiation Oncology

## 2010-08-03 DIAGNOSIS — N400 Enlarged prostate without lower urinary tract symptoms: Secondary | ICD-10-CM | POA: Insufficient documentation

## 2010-08-03 DIAGNOSIS — D489 Neoplasm of uncertain behavior, unspecified: Secondary | ICD-10-CM

## 2010-08-03 DIAGNOSIS — D1809 Hemangioma of other sites: Secondary | ICD-10-CM | POA: Insufficient documentation

## 2010-08-03 DIAGNOSIS — K7689 Other specified diseases of liver: Secondary | ICD-10-CM | POA: Insufficient documentation

## 2010-08-03 DIAGNOSIS — R059 Cough, unspecified: Secondary | ICD-10-CM | POA: Insufficient documentation

## 2010-08-03 DIAGNOSIS — R05 Cough: Secondary | ICD-10-CM | POA: Insufficient documentation

## 2010-08-03 DIAGNOSIS — D487 Neoplasm of uncertain behavior of other specified sites: Secondary | ICD-10-CM | POA: Insufficient documentation

## 2010-08-03 MED ORDER — IOHEXOL 300 MG/ML  SOLN
100.0000 mL | Freq: Once | INTRAMUSCULAR | Status: AC | PRN
  Administered 2010-08-03: 100 mL via INTRAVENOUS

## 2010-08-03 NOTE — Assessment & Plan Note (Signed)
OFFICE VISIT  [name], Robert Burns DOB:  1940-12-06                                       08/02/2010 CHART#:[id number]  Patient underwent resection of the left superficial femoral artery mass February 23 of this year with insertion of an interposition 6-mm Gore- Tex graft.  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  Patient is referred by Dr. Mancel Bale for further recommendations from an oncologic standpoint. Initially Dr. Jimmy Picket, the pathologist, said that the margins were negative.  The patient was eventually referred to Dr. Michell Heinrich, radiation oncology.  She discussed this further with Dr. Luisa Hart, who later after further reviewing the case decided that there was some tumor involving 1 of the margins.  This is a benign tumor which can have malignant characteristics, and in a very small percentage of people can metastasize or locally refer.  The patient is currently involved in the workup by Dr. Michell Heinrich, including CT scan, etc.  The patient came to the office today to discuss possible further options, and we had a long discussion with him and his wife.  His symptoms of pain and discomfort in the left leg pretty much resolved after his surgery.  He had some irritation of a nerve which was adjacent to the superficial femoral artery in the proximal thigh.  Today a duplex scan was performed, which reveals a widely patent bypass graft with normal flow which is triphasic and an ABI of 0.9 in the left foot.  The patient also has a stent in his superficial femoral artery distally, which was inserted by Dr. Nanetta Batty in the past.  I recommended local re-excision of this since there is some question about it, although it sounds like the likelihood of this recurring either locally or in a distant fashion is very low.  The patient will be meeting with Dr. Michell Heinrich later this week and will obtain  the results of her studies, and if that is negative, the patient is leaning towards re-excision of this with more extensive proximal and distal margins and replacement with Gore-Tex graft.  Following his visit with Dr. Michell Heinrich, he will be back in touch with me regarding her findings, and we will then discuss potential surgery in 3-4 weeks.  The patient realizes that this is a very rare neoplasm, and that no one, including the pathologist, had extensive history with this.  Many pathologists consulted on this case before signing it out.  The patient will be in touch with me in the next few weeks for [place] to decide the course of action.    [name], M.D. Electronically Signed  JDL/MEDQ  D:  08/02/2010  T:  08/03/2010  Job:  [id number]  cc:   Ladene Artist, M.D. Lurline Hare, M.D. Dalbert Mayotte, M.D.

## 2010-08-23 ENCOUNTER — Ambulatory Visit: Payer: Medicare HMO | Admitting: Vascular Surgery

## 2010-08-26 NOTE — Discharge Summary (Signed)
NAME:  Robert Burns, Robert Burns                         ACCOUNT NO.:  [id number]   MEDICAL RECORD NO.:  [id number]                   PATIENT TYPE:  OIB   LOCATION:  2002                                 FACILITY:  [facility]   PHYSICIAN:  Nanetta Batty, M.D.                DATE OF BIRTH:  [birth year]   DATE OF ADMISSION:  10/30/2002  DATE OF DISCHARGE:  10/31/2002                                 DISCHARGE SUMMARY   DISCHARGE DIAGNOSES:  1. Peripheral vascular disease with claudication, status post elective left     superficial femoral artery angioplasty and stenting this admission.  2. Hypertension, controlled.  3. Coronary artery disease, coronary artery bypass grafting March 2004 with     LIMA to LAD, SVG to the diagonal, SVG to the ramus and OM, SVG to the     PDA, Cardiolite study September 23, 2002 was negative with good LV function.  4. Treated hyperlipidemia.   HOSPITAL COURSE:  Robert Burns is a 70 year old male followed by Dr. Scotty Court  and Dr. Allyson Sabal with a history of coronary disease and peripheral vascular  disease. He has been having claudication. Dopplers as an outpatient  suggested increased velocities in the left superficial femoral artery. He  was set up for outpatient angiogram. This was done October 30, 2002 by Dr.  Allyson Sabal. This revealed normal aorta, 95% mid left SFA with three-vessel run  off, and 70% right SFA with three-vessel run off. He underwent PTA and  stenting to the left SFA with good result. He was put on Plavix. The patient  is leaving town, going to [place] next week. He will have Dopplers done  Monday or Tuesday next week and then see Dr. Allyson Sabal when he comes back from  [place]. He will call for this appointment. During his hospitalization, it  was noted that he was on Micardis 80/12.5 in the morning and Micardis 80 at  night. Dr. Domingo Sep discussed this dose with him and said that it was higher  than the recommended dose in the PDR and suggested he cut this back. The  patient declined, saying this is the only treatment that controls his blood  pressure. We have encouraged him to follow up with Dr. Scotty Court regarding  this.   DISCHARGE MEDICATIONS:  1. Plavix 75 mg once a day.  2. Aspirin 81 mg a day.  3. Micardis as taken at home, 80/12.5 in the morning and 80 mg at night.  4. Norvasc 5 mg twice a day.  5. Lopressor 25 mg twice a day.   LABORATORY DATA:  White count 9.3, hemoglobin 12.1, hematocrit 36.3,  platelets 136, sodium 144, potassium 3.3, BUN 14, creatinine 1.0, glucose  88.    DISPOSITION:  The patient is discharged in stable condition and will follow  up with Dr. Allyson Sabal. He will be contacted by the office regarding his Dopplers  next Monday or Tuesday. He will contact us  when he gets back from his trip  to [place].      Abelino Derrick, P.A.                      Nanetta Batty, M.D.    Lenard Lance  D:  10/31/2002  T:  11/01/2002  Job:  [id number]   cc:   Ellin Saba., M.D.  [address] [place]  [place] [address]  Fax: [phone]

## 2010-08-26 NOTE — Discharge Summary (Signed)
NAME:  Robert Burns, Robert Burns                         ACCOUNT NO.:  [id number]   MEDICAL RECORD NO.:  [id number]                   PATIENT TYPE:  INP   LOCATION:  2013                                 FACILITY:  [facility]   PHYSICIAN:  Salvatore Decent. Dorris Fetch, M.D.         DATE OF BIRTH:  [birth year]   DATE OF ADMISSION:  06/20/2002  DATE OF DISCHARGE:  06/30/2002                                 DISCHARGE SUMMARY   PRIMARY CARE PHYSICIAN:  Dr. Quintella Reichert.   CARDIOLOGIST:  Dr. Allyson Sabal.   FINAL DIAGNOSES:  1. Severe three-vessel coronary artery disease with unstable angina.  2. Normal left ventricular function.  Ejection fraction 60-70%.  3. Diet-controlled diabetes.  4. Hypercholesterolemia.  5. Hypertension.  6. History of peptic ulcer disease.  7. Sleep apnea with continuous positive airway pressure use at home.  8. Mild acute perioperative renal insufficiency with return to baseline of     1.3 prior to discharge.  9. Mild volume overload.   PROCEDURES:  1. Cardiac catheterization on June 23, 2002.  2. CABG x5 on June 24, 2002, with the following grafts:  LIMA to LAD,     saphenous vein graft to diagonal, saphenous vein graft sequentially from     ramus to OM, saphenous vein graft to PDA.   BRIEF HISTORY:  The patient was a 70 year old gentleman with no previous  cardiac history.  He had cardiac risk factors of high blood pressure,  hypercholesterolemia, and diabetes.  He also had a history of remote  smoking.  He had been having increasing fatigue.  In December 2003, a stress  test was done which was normal.  Five days prior to his admission to the  hospital he developed significant chest pain.  He went to the emergency  room.  He had a cardiac catheterization, and CVTS was consulted.  A CABG was  recommended.  He underwent the procedure on June 24, 2002, with no  complications.  Postoperatively he remained stable.  There were no major  problems.  He had some mild renal insufficiency with  creatinine up to 1.6.  This gradually got better.  He was kept in SICU postoperative day #1  secondary to some low blood sugars and late extubation.  The next day he  again was doing well.  He was in sinus rhythm, hemodynamically stable,  neurologically intact.  He was transferred to Unit 2000.  There, he  continued to do well.  ACE was restarted in light of his normalizing  creatinine.  By postoperative day #6 he was doing very well.  He was  afebrile, vital signs were stable, BP 133/60, heart rate 78 and sinus  rhythm.  He was on room air.  He was still about 6-1/2 pounds over his  preoperative weight.  CBGs were in good control.  Wounds were healing well.  He was walking okay.  He was discharged home in stable condition.   MEDICATIONS AT  THE TIME OF DISCHARGE:  1. Lopressor 50 mg, 1 tablet twice a day.  2. Lasix 40 mg, 1 tablet daily.  3. Potassium 20 mEq, 1 tablet daily.  4. Colace 100 mg, 2 tablets daily.  5. Micardis 20 mg, 1 tablet daily.  6. He was told to resume his previous Lipitor 10 mg daily.  7. Enteric-coated aspirin 325 mg daily.  8. Omeprazole 20 mg daily.  9. Ultram 1-2 tablets every four to six hours p.r.n. for pain.   ALLERGIES:  No known drug allergies.   CONDITION:  Stable.   DISPOSITION:  Home.   SPECIAL INSTRUCTIONS:  He was told to avoid driving, heavy lifting,  strenuous activity.  He was told to walk daily and use his incentive  spirometer daily.  He was told to clean his wounds gently daily with soap  and water and to call the office if he had any problems.   FOLLOW-UP:  1. Dr. Allyson Sabal two weeks after discharge.  2. Dr. Dorris Fetch three weeks after discharge.  Office would call with     appointment.  He was to get a chest x-ray one hour before seeing Dr.     Dorris Fetch.     Lissa Merlin, P.A.                          Salvatore Decent Dorris Fetch, M.D.    Alwyn Ren  D:  08/26/2002  T:  08/27/2002  Job:  [id number]

## 2010-08-26 NOTE — Consult Note (Signed)
NAME:  Robert Burns, BUCHMANN                         ACCOUNT NO.:  [id number]   MEDICAL RECORD NO.:  [id number]                   PATIENT TYPE:  INP   LOCATION:  4741                                 FACILITY:  [facility]   PHYSICIAN:  Salvatore Decent. Dorris Fetch, M.D.         DATE OF BIRTH:  [birth year]   DATE OF CONSULTATION:  06/23/2002  DATE OF DISCHARGE:                                   CONSULTATION   REFERRING PHYSICIAN:  Dr. Nanetta Batty.   REASON FOR CONSULTATION:  Severe three-vessel coronary disease with unstable  angina.   HISTORY OF PRESENT ILLNESS:  The patient is a 70 year old gentleman with a  history of non-insulin-dependent diabetes mellitus controlled with diet,  high blood pressure, hypercholesterolemia, gastroesophageal reflux and sleep  apnea.  He had been worked up for increasing fatigue in December with a  stress test which was normal at that time.  He was not having chest pain at  that time.  Approximately five days prior to admission, he developed  substernal squeezing chest pain which radiated to his neck and both  shoulders.  This was associated with shortness of breath and was exertional.  He did not have any nausea, vomiting or diaphoresis.  He would have relief  of pain with rest and had no nocturnal or rest symptoms.  He was seen in the  emergency room at [facility] and admitted for rule out MI protocol.  His  troponins were slightly elevated and CK-MBs were not.  His peak troponin was  0.13.  The patient, today, underwent cardiac catheterization which showed  severe three-vessel disease with a septal occlusion in his right coronary  and diffuse disease in the LAD, ramus and circumflex.  Left ventricular  function was well-preserved.  The patient currently is pain-free.   PAST MEDICAL HISTORY:  Diet-controlled diabetes, hypercholesterolemia,  hypertension, history of peptic ulcer disease in the past, sleep apnea for  which he uses CPAP.   CURRENT MEDICATIONS:  1. Lipitor 10 mg p.o. daily.  2. Aspirin 81 mg p.o. daily.  3. Etodolac ER 500 mg p.o. b.i.d.  4. Micardis HCT 80/12.5 mg one p.o. daily.  5. Norvasc 10 mg p.o. daily.  6. Omeprazole 20 mg p.o. daily.   ALLERGIES:  He has no known drug allergies.   FAMILY HISTORY:  No premature coronary disease.   SOCIAL HISTORY:  He is married.  He is retired.  He is not a smoker; he did  smoke remotely in the past but quit 30 years ago.   REVIEW OF SYSTEMS:  He has been fatiguing easily and may have had some  swelling in his leg a couple of days ago according to his wife, but the  patient denies.  Chest pain and shortness of breath as described in HPI.  No  orthopnea, paroxysmal nocturnal dyspnea.  No history of bleeding or clotting  abnormalities.  All other systems are negative.   PHYSICAL EXAMINATION:  GENERAL:  On physical examination, the patient is a  70 year old white male in no acute distress.  General appearance is well-  developed, well-nourished, in no acute distress.  VITAL SIGNS:  Blood pressure is 160/70.  Pulse is 52 and regular.  Respirations are 16.  HEENT:  Remarkable for poor dentition.  NECK:  Neck supple with no thyromegaly, adenopathy or bruits.  CARDIAC:  Regular rate and rhythm, normal S1 and S2.  No rubs, murmurs or  gallops.  LUNGS:  His lungs are clear to auscultation and percussion with equal breath  sounds bilaterally.  ABDOMEN:  His abdomen is soft and nontender.  EXTREMITIES:  His extremities are without clubbing, cyanosis, or edema.  He  has 2+ posterior tibial pulses bilaterally.  SKIN:  His skin is warm, pink and dry.   LABORATORY DATA:  Cardiac cath as described in HPI, normal LV function,  severe three-vessel disease.   Troponin of 0.13, CPK of 255 with a CK-MB of 3.1, but a normal relative  index.  Hepatic profile within normal limits.  Sodium 142, potassium 3.4,  BUN and creatinine 20 and 1.7, glucose 95.  Hematocrit 30, platelets  178,000, white count  10.3.  PT 13.8.   IMPRESSION:  The patient is a 70 year old gentleman who presents with a four-  day history of new-onset exertional angina.  At catheterization, he had  severe three-vessel coronary disease.  The patient was advised to undergo  coronary artery bypass grafting for survival benefit and relief of symptoms.   I discussed in detail with the patient the indications, risks, benefits and  alternatives treatments.  They understand the operative approach to be used  including the incisions, use of the heart/lung machine, expected  postoperative recovery as well as long-term outcome with possible early or  late graft occlusion.   I discussed in detail with the patient and Mrs. Branton the risks of the  procedure which include, but are not limited to, death, stroke, myocardial  infarction, bleeding, possible need for transfusion, infection, as well as  other organ system dysfunction including respiratory, renal, hepatic or  gastrointestinal dysfunction.  He understands these risks, accepts them and  agrees to proceed with surgery.   Given his baseline creatinine of 1.7 prior to catheterization, we will plan  to check his creatinine in the morning.  If that is stable, we will proceed  with bypass surgery tomorrow afternoon.  We will check carotid Dopplers  prior to surgery.                                                Salvatore Decent Dorris Fetch, M.D.    SCH/MEDQ  D:  06/23/2002  T:  06/24/2002  Job:  [id number]   cc:   Nanetta Batty, M.D.  [address], [address]  [place]  [place] [address]  Fax: [phone]   Lacretia Leigh. Quintella Reichert, M.D.  [place] [address] [place]  [place] [address]  Fax: [phone]

## 2010-08-26 NOTE — Cardiovascular Report (Signed)
NAME:  Robert Burns, Robert Burns                         ACCOUNT NO.:  [id number]   MEDICAL RECORD NO.:  [id number]                   PATIENT TYPE:  OIB   LOCATION:  2866                                 FACILITY:  [facility]   PHYSICIAN:  Nanetta Batty, M.D.                DATE OF BIRTH:  [birth year]   DATE OF PROCEDURE:  10/30/2002  DATE OF DISCHARGE:                              CARDIAC CATHETERIZATION   PROCEDURES PERFORMED:  1. Peripheral angiogram.  2. Percutaneous transluminal angioplasty and stent placement.   CARDIOLOGIST:  Nanetta Batty, M.D.   INDICATIONS FOR PROCEDURE:  Robert Burns is a 70 year old white male patient of  Drs Nanetta Batty and Feliciana Rossetti.  He has a history of CAD status post  coronary artery bypass grafting in March of 2004.  He was seen for  claudication and had Dopplers done October 21, 2002, which revealed an right  ABI of 1.2 and left ABI of 0.84.  He had a 417 cm per second biphasic signal  in his mid left SFA.  He presents now for angiography and potential  intervention.   PROCEDURE DESCRIPTION:  The patient was brought to the sixth floor [facility] peripheral vascular angiographic suite in the postabsorptive state.  He  was premedicated with p.o. Valium.  His right groin was prepped and shaved  in the usual sterile fashion.  One percent Xylocaine was used for local  anesthesia.  A 5 upgraded to a 7 [name] sheath was inserted into the right  femoral artery using the standard Seldinger technique.  A 5 [name] tennis  raquet catheter was used to achieve distal abdominal aortography as well as  bifemoral runoff.  Omnipaque dye was used for the entirety of the case.  Ventricular and aortic pressures were monitored during the case.   ANGIOGRAPHIC RESULTS:  1. Abdominal Aorta:  Normal.  2. Left Lower Extremity:  Ninety-five percent mid left SFA stenosis with     three-vessel runoff.  3. Right Lower Extremity:  Seventy percent segmental mid right SFA stenosis  with three-vessel runoff.   ADDITIONAL PROCEDURE DESCRIPTION:  The patient received 2500 units of  heparin intravenously.  Contralateral access was obtained with a 5 [name]  short IMA catheter, a 025 angled glide wire, which was exchanged for a 035  long Wholey wire.  A 7 French long Terumo sheath was then advanced over the  iliac bifurcation.  The [facility]  wire was then advanced across the mid left  SFA lesion and PTA was performed with 4  2 Agile Guidant balloon.  Following  this a 7 x 40 mm long Guidant self-expanding Absolute Nitinol stent was then  deployed under angiographic control and post dilated with a 5 x 4 Agile  balloon resulting in reduction of a 95% mid left SFA stenosis to 0% residual  and excellent flow.   The patient tolerated the procedure well.  The sheath was  then withdrawn  over the iliac bifurcation.  ACTs measured less than 170.  The sheath was  removed and pressure was held on the groin to achieve hemostasis.  The  patient left the lab in stable condition.   PLAN:  Plans will be to:  1. Continue treatment with aspirin and Plavix.  2. Discharge in the morning.  3. We will get follow up Dopplers early next week.  4. We will see back in two weeks for followup.   The patient left the lab in stable condition.                                                 Nanetta Batty, M.D.    Cordelia Pen  D:  10/30/2002  T:  10/30/2002  Job:  [id number]  Sixth Floor [facility] Suite   [facility]  [address]  [place], [place]  [address]   Lacretia Leigh. Quintella Reichert, M.D.  [address] [place]  [place] [address]  Fax: [phone]   cc:   Sixth Floor [facility]   [facility]  [address]  [place], [place]  [address]   Lacretia Leigh. Quintella Reichert, M.D.  [address] [place]  [place] [address]  Fax: [phone]

## 2010-08-26 NOTE — Op Note (Signed)
NAME:  Robert Burns, Robert Burns                         ACCOUNT NO.:  [id number]   MEDICAL RECORD NO.:  [id number]                   PATIENT TYPE:  INP   LOCATION:  2312                                 FACILITY:  [facility]   PHYSICIAN:  Salvatore Decent. Dorris Fetch, M.D.         DATE OF BIRTH:  1940-12-27   DATE OF PROCEDURE:  06/24/2002  DATE OF DISCHARGE:                                 OPERATIVE REPORT   PREOPERATIVE DIAGNOSES:  Three-vessel disease with unstable angina.   POSTOPERATIVE DIAGNOSES:  Three-vessel disease with unstable angina.   OPERATION PERFORMED:  Median sternotomy, extracorporeal circulation,  coronary artery bypass grafting times five (left internal mammary artery to  left anterior descending, saphenous vein graft to first diagonal, sequential  saphenous vein graft to ramus intermedius and obtuse marginal 1, saphenous  vein graft to posterior descending), endoscopic vein harvest from right  thigh, open vein harvest from right calf.   SURGEON:  Salvatore Decent. Dorris Fetch, M.D.   ASSISTANT:  Toribio Harbour, R.N.   ANESTHESIA:  General.   FINDINGS:  Good quality conduits.  Posterior descending fair quality,  quickly tapered, only a 1 mm probe passed distally.  Ramus intermedius,  small fair quality, diagonal and OM1 good quality, LAD good quality at the  site of anastomosis.  Diffuse disease, 1.5 mm probe passed easily proximally  and distally.   INDICATIONS FOR PROCEDURE:  The patient is a 70 year old gentleman who  presents with a four-day history of new onset angina.  This has been  exertional but has been progressive over that time.  He was admitted and  underwent cardiac catheterization which revealed three-vessel coronary  disease and he was referred for coronary artery bypass grafting.  The  indications, risks, benefits and alternative procedures were discussed in  detail with the patient and the risks were outlined in his chart and he  understood and accepted the  risks and agreed to proceed.   DESCRIPTION OF PROCEDURE:  The patient was brought to the preop holding area  on June 24, 2002.  Lines were placed to monitor arterial, central venous  and pulmonary arterial pressure.  Intravenous antibiotics were administered.  The patient was taken to the operating room, anesthetized and intubated.  A  Foley catheter was placed.  The chest, abdomen and legs were then prepped  and draped in the usual sterile fashion.   A median sternotomy was performed and the left internal mammary artery was  harvested using the standard technique.  Branches were doubly clipped and  divided.  The patient was fully heparinized prior to dividing the distal end  of the mammary artery.  There was excellent flow through the cut end of the  vessel.  Simultaneously, an incision was made in the medial aspect of the  right leg at the level of the knee.  The greater saphenous vein was  identified and harvested from the right thigh endoscopically.  It was a good  quality vein graft.  Additional vein was harvested from a bridged incision  in the upper calf to have adequate length for all bypasses.   After harvesting the conduits the pericardium was opened.  The ascending  aorta was inspected and palpated.  There was no atherosclerotic disease.  The aorta was cannulated via concentric 2-0 Ethibond pledgeted pursestring  sutures.  A dual stage venous cannula was placed via pursestring suture in  the right atrial appendage.  Cardiopulmonary bypass was instituted and the  patient was cooled to 32 degrees Celsius.  The coronary arteries were  inspected and anastomotic sites were chosen.  The conduits were inspected  and cut to length.  A foam pad was placed in the pericardium to protect the  left phrenic nerve.  A temperature probe was placed in the myocardial septum  and a cardioplegia cannula was placed in the ascending aorta.   The aorta was crossclamped.  The left ventricle was  emptied via the aortic  root vent.  Cardiac arrest then was achieved with a combination of cold  antegrade blood cardioplegia and topical iced saline.  After achieving a  complete diastolic arrest and myocardial septal temperature of 11 degrees  Celsius, the following distal anastomoses were performed.   First a reversed saphenous vein was placed end-to-side to the posterior  descending branch of the right coronary.  This was subtotally occluded and  filled via collaterals.  It was a 1.5 mm fair quality target.  It tapered  just distal to the anastomosis down to a 1 mm vessel, but a probe did pass  beyond that.  The vein graft was of good quality, the anastomosis was  performed end-to-side with a running 7-0 Prolene suture.  Cardioplegia was  administered through the graft.  There was excellent flow.  This anastomosis  and all others were probed proximally and distally prior to tying the  sutures.   Next a reversed saphenous vein was placed end-to-side to the first diagonal  branch of the LAD.  This was a 1.5 mm good quality target.  The vein graft  was of good quality.  The anastomosis again was performed with a running 7-0  Prolene suture.  Again there was good flow through this graft, cardioplegia  was administered.  There was good hemostasis.   Next a reversed saphenous vein graft was placed sequentially to the ramus  intermedius and first obtuse marginal.  The ramus intermedius was a 1.3 mm  fair quality target.  It bifurcated just distal to the anastomosis.  The  side-to-side anastomosis was performed off the side branch of the vein  graft.  An end-to-side anastomosis then was performed to the OM1 which was a  2 mm good quality target.  Both anastomoses were performed with running 7-0  Prolene sutures.  There was excellent flow to the graft.  Cardioplegia was  administered.  There was good hemostasis at both anastomoses.  Next, the left internal mammary artery was brought through  the window in the  pericardium.  The distal end was spatulated.  It was anastomosed end-to-side  to the distal LAD.  The mammary and LAD both were 2 mm vessels.  The LAD was  diffusely diseased but was of good quality at the site of anastomosis.  A  1.5 mm probe passed easily both proximally and distally to the apex.  The  end-to-side anastomosis was performed with a running 8-0 Prolene suture.  At  the completion of the mammary to LAD  anastomosis the bulldog clamp was  briefly removed to inspect for hemostasis.  There was immediate and rapid  septal rewarming.  The bulldog clamp was replaced.  The mammary pedicle was  tacked to the epicardial surface of the heart.   Additional cardioplegia was administered.  The vein grafts were cut to  length.  The cardioplegia cannula was removed from the ascending aorta and  the proximal vein graft anastomoses were performed to 4.4 mm punch  aortotomies with running 6.0 Prolene sutures.  After the completion of the  final proximal anastomosis, the patient was placed in Trendelenburg  position.  The bulldog clamp was again removed from the mammary artery and  again, immediate and rapid septal rewarming was noted.  Deairing was  performed of the aortic root and the aortic crossclamp was removed.  The  total crossclamp time was 83 minutes.   The patient initially had complete heart block with a junctional escape.  During rewarming all proximal and distal anastomoses were inspected for  hemostasis.  Epicardial pacing wires were placed on the right ventricle and  right atrium and when the patient had been rewarmed to a core temperature of  37 degrees Celsius, a dopamine drip was initiated.  The patient was DDD  paced and then weaned from cardiopulmonary bypass without difficulty.  The  total bypass time was 154 minutes.  The initial cardiac index was greater  than 2.5L per minute per meter squared.  The patient remained  hemodynamically stable throughout  the postbypass period.   The test dose of protamine was administered and was well tolerated.  The  atrial and aortic cannulae were removed.  The remainder of the protamine was  administered without incident.  The chest was irrigated with 1L of warm  normal saline containing 1 gm of vancomycin.  Hemostasis was achieved.  A  left pleural and two mediastinal chest tubes were placed through separate  subcostal incisions.  The pericardium was reapproximated with interrupted 3-  0 silk sutures.  The sternum was closed with heavy gauge stainless steel  wires.  There were no hemodynamic changes with closure of the sternum.  The  remainder of the incisions were closed in standard fashion with subcuticular  skin closures.  All sponge, needle and instrument counts were correct at the  end of the procedure.  There were no intraoperative complications.  The  patient was taken from the operating room to the surgical intensive care unit intubated and in critical but stable condition.                                                Salvatore Decent Dorris Fetch, M.D.    SCH/MEDQ  D:  06/24/2002  T:  06/25/2002  Job:  [id number]   cc:   Nanetta Batty, M.D.  [address], [address]  [place]  [place] [address]  Fax: [phone]   Lacretia Leigh. Quintella Reichert, M.D.  [address] [place]  [place] [address]  Fax: [phone]

## 2010-08-26 NOTE — Cardiovascular Report (Signed)
NAME:  Robert Burns, Robert Burns                         ACCOUNT NO.:  [id number]   MEDICAL RECORD NO.:  [id number]                   PATIENT TYPE:  OUT   LOCATION:  CATH                                 FACILITY:  [facility]   PHYSICIAN:  Nanetta Batty, M.D.                DATE OF BIRTH:  08-01-1940   DATE OF PROCEDURE:  06/23/2002  DATE OF DISCHARGE:                              CARDIAC CATHETERIZATION   INDICATIONS FOR PROCEDURE:  The patient is a 70 year old man married white  male with a history of type 2 diabetes, hypertension, hyperlipidemia, and  family history of heart disease.  He has noticed some chest pressure and  required stress testing 03/24/02 that showed no ischemia.  He was admitted  to [facility] on 06/20/02 with unstable angina.  He ruled out for  myocardial infarction.  He had no significant EKG changes.  He presents now  for diagnostic coronary arteriography.   DESCRIPTION OF PROCEDURE:  The patient was brought to the second floor [facility] catheterization laboratory in the postabsorptive state.  He was  premedicated with p.o. Valium.  His right groin was prepped and draped in  the usual sterile fashion.  Xylocaine 1% was used for local anesthesia.  A 6-  French sheath was inserted into the right femoral artery using the standard  Seldinger technique.  A 6-French right and left Judkins diagnostic catheters  along with a 6-French pigtail catheter were used for selective coronary  angiography, left ventriculography, and sub-selective left internal mammary  artery angiography, and distal abdominal aortography.  Omnipaque dye was  used for the entirety of the case.  Resting aorta, left ventricular and  pullback pressures were recorded.   HEMODYNAMICS:  1. Aorta systolic pressure 210, diastolic pressure 94.  Left ventricular     systolic pressure 205.  End-diastolic pressure 36.   SELECTIVE CORONARY ANGIOGRAPHY:  1. Left main normal.  2. LAD:  The LAD had a  70% proximal, hypodense lesion, followed in sequence     by 75% segmental proximal stenosis.  There was a 90% stenosis in the mid     portion after the takeoff of a moderate-sized diagonal branch which     itself had a 90% proximal stenosis.  The LAD was a large vessel distally     and wrapped the apex.  3. Ramus intermedius branch:  Moderate in size with 60% proximal stenosis.  4. Left circumflex:  70-80% segmental mid, followed by a small first and     large second obtuse marginal branch.  There is 80% stenosis in the AV     groove CIRC just distal to the second marginal branch.  There were left-     to-right collaterals to the occluded RCA from the circumflex coronary     artery.  5. Right coronary artery.  A dominant vessel with 95% proximal followed by  99% segmental mid and distal with bidirectional collaterals to a large     acute marginal branch.   LEFT VENTRICULOGRAPHY:  RAO left ventriculogram was performed using 25 cc of  Omnipaque dye at 12 cc per second.  The overall LVEF was estimated greater  than 55% without segmental wall motion abnormalities.   Left internal mammary artery:  This vessel was sub selectively visualized  and was widely patent.  It is suitable for use during coronary artery bypass  grafting.   DISTAL ABDOMINAL AORTOGRAPHY:  Performed using 20 cc of Omnipaque dye at 20  cc per second.  Renal arteries are widely patent.  The infrarenal abdominal  aorta and the iliac bifurcation appeared free of significant atherosclerotic  changes.    IMPRESSION:  1. Robert Burns has vessel disease, normal left ventricular function, and     unstable angina.  2. He is an excellent candidate for coronary artery bypass grafting.   The sheaths were removed, and pressure held on the groin to achieve  hemostasis.  The patient left the lab in stable condition.  He will be  restarted on Lovenox, and CVTS will consult.                                               Nanetta Batty, M.D.    Cordelia Pen  D:  06/23/2002  T:  06/23/2002  Job:  [id number]   cc:   [facility]   [facility]  [address]  [place] [address]   Lacretia Leigh. Quintella Reichert, M.D.  [address] [place]  [place] [address]  Fax: [phone]

## 2010-08-26 NOTE — Op Note (Signed)
NAME:  Robert Burns, KOTTKE NO.:  [id number]   MEDICAL RECORD NO.:  [id number]          PATIENT TYPE:  OIB   LOCATION:  2807                         FACILITY:  [facility]   PHYSICIAN:  Darlin Priestly, MD  DATE OF BIRTH:  10-Mar-1941   DATE OF PROCEDURE:  10/13/2005  DATE OF DISCHARGE:                                 OPERATIVE REPORT   PROCEDURE PERFORMED:  Insertion of Medtronic Adapta ADDR01 generator, serial  number  [id number] with passive ventricular and active and active atrial  leads.   COMPLICATIONS:  None.   INDICATIONS FOR PROCEDURE:  Robert Burns is a 70 year old male patient of  Robert Burns, M.D. and Robert Burns, M.D. with a history of CAD.  Subsequently underwent bypass surgery in March of 2004 consisting of LIMA to  LAD, vein graft to diagonal, vein graft to ramus and obtuse marginal 1 and  vein graft to PDA.  He is also noted to have peripheral vascular disease,  status post left superficial femoral artery PTA and stenting.  He has had  intermittent episodes of bradycardia, prompting discontinuation of all  negative chronotropic agents with persistent resting heart rate in the 40s.  Recently has complained of increasing fatigue and lethargy.  He is now  referred for dual chamber pacer implant secondary to sick sinus syndrome.   DESCRIPTION OF PROCEDURE:  After giving informed written consent, the  patient was brought to the cardiac cath lab.  The right chest was shaved,  prepped and draped in sterile fashion.  ECG monitor established.  1%  lidocaine was used to anesthetize the left mid subclavicular area.  Next,  approximately a 3 cm horizontal mid infraclavicular incision was carried  out.  Hemostasis was obtained with electrocautery.  Blunt dissection was  used to carry this down to the right pectoral fascia.  Next, approximately 3  x 4 cm pocket was created over the right pectoral fascia and again,  hemostasis was obtained with electrocautery.  The  right subclavian vein was  then entered x2 with easy passage of two preformed J-wires into the SVC and  right atrium.  Two silk sutures were then placed in the base of each wire  and anchored to the pectoral fascia.  Next #7 [name] dilator and sheath then  inserted over the first guidewire and the guidewire and dilator were then  removed.  Following this, a 52 cm passive Medtronic lead, model M5895571,  serial number [id number] was passed into the right atrium.  Peel-away sheath  was removed.  A second 7 French dilator and sheath were then placed over the  second retained guidewire and the dilator and guidewire were removed.  Next,  a 45 cm active Medtronic lead model #5076, serial [id number] was then  passed into the right atrium.  The peel-away sheath was then removed.  A J-  curve was then placed in the ventricular lead stilette.  The ventricular  lead was then allowed to prolapse the tricuspid valve.  The ventricular lead  was then placed in the RV apex and thresholds determined.  R wave measured  28.6 mV.  Impedance was 578 ohms.  Threshold was 0.5 V at 0.5 msec.  Current  was 0.7 mA.  10 V negative for diaphragmatic stimulation. The atrial  stilette was then removed and the preformed J stilette was then placed in  the right atrial lead.  The right atrial lead was positioned in the area of  the right atrial appendage.  The screw was extended and thresholds then  determined.  P-wave measured at 1.8 mV.  Impedance 841 ohms. Threshold in  atrium was 0.7 V at 0.5 msec.  Current was __________.  Again 10 V negative  for diaphragmatic stimulation.  Both leads were then anchored to the  previously placed silk sutures.  The pocket was then copiously irrigated  with 1% kanamycin solution.  Again, hemostasis was confirmed.  The leads  were then connected in serial fashion to a Medtronic Adapta generator, model  #ADDR01, serial [id number]. Head screws were tightened and pacing was  confirmed.  A  single silk suture was placed in the apex of the pocket.  The  generator and leads were then delivered into the pocket and the head was  secured with silk suture.  The subcutaneous layer was closed using running 2-  0 Vicryl.  The skin was then closed with running 4-0 Vicryl.  Steri-Strips  applied.  Patient returned to recovery room in stable condition.   CONCLUSION:  Successful implant of a Medtronic Adapta [id number] generator,  serial [id number] with passive ventricular and active atrial leads.      Darlin Priestly, MD  Electronically Signed     RHM/MEDQ  D:  10/13/2005  T:  10/13/2005  Job:  [id number]   cc:   Robert Burns, M.D.  Fax: [phone]   Robert Burns, M.D.  Fax: [phone]

## 2010-08-26 NOTE — Discharge Summary (Signed)
NAME:  DEUNTE, BLEDSOE NO.:  [id number]   MEDICAL RECORD NO.:  [id number]          PATIENT TYPE:  OIB   LOCATION:  3729                         FACILITY:  [facility]   PHYSICIAN:  Darlin Priestly, MD  DATE OF BIRTH:  10/10/1940   DATE OF ADMISSION:  10/13/2005  DATE OF DISCHARGE:  10/14/2005                                 DISCHARGE SUMMARY   DISCHARGE DIAGNOSES:  1.  Symptomatic bradycardia; status post pacemaker insertion during this      admission.  2.  Known coronary artery disease; status post coronary artery bypass graft      in 2004.  3.  Known peripheral vascular disease with a stent to the left superficial      femoral artery.  4.  Sleep apnea, on continuous positive airway pressure at night.  5.  Diabetes mellitus.  6.  Hyperlipidemia.  7.  Hypertension.   PROCEDURES PERFORMED:  Pacemaker implantation performed by Dr. Jenne Campus on  10/13/2005.  This is a dual-chamber pacemaker made by [employer], the serial  number is [id number].  The atrial lead serial number is [id number];  ventricular lead serial number is [id number].  The patient tolerated the  procedure well.  It was implanted in the right side of the chest.  There  were no immediate or delayed complications of the procedure.   HISTORY OF PRESENT ILLNESS AND HOSPITAL COURSE:  This is a pleasant 70-year-  old gentleman, patient of Dr. Allyson Sabal, who was seen in the office with  complaints of weakness and fatigue, lack of ability to do much of  activities, dizziness, and he was diagnosed with bradycardia.  An  appointment was made with Dr. Jenne Campus in consultation for evaluation of  pacemaker insertion.  Dr. Jenne Campus saw the patient and found him to be a  suitable candidate for pacemaker insertion, and the patient was admitted to  the hospital and underwent that procedure on 10/13/2005.  For a full  description of the procedure, please refer to the dictated note by Dr.  Jenne Campus.   The next morning he was  assessed by Dr. Jenne Campus in the hospital.  The pacer  site did not have any signs of bleeding or ecchymosis.  His pacer was  interrogated by a pacer representative; and it showed normal parameters, no  change was made in device programming.   DISCHARGE INSTRUCTIONS:  The patient was instructed to avoid any vigorous  movements, especially overhead reaching with the right arm.  He was not  allowed to drive or to lift any heavy projects, push or pull, for a week.  Also was instructed not wet the incision nor to take showers or bathe for a  week up until he will be seen in our office.  He was instructed to leave the  Steri-Strips on up until the appointment time.   MEDICATIONS ON DISCHARGE:  Benicar HCT 40/25 mg per day; lovastatin 40 mg  per day; Norvasc 10 mg per day; Clonidine 0.3 mg twice per day; Plavix 75 mg  per day, still on hold, and the patient is restart it on Monday, July  9th.   FOLLOW-UP:  The patient with schedule a follow-up appointment for a pacer  site checkup in one week.      Raymon Mutton, P.A.      Darlin Priestly, MD  Electronically Signed    MK/MEDQ  D:  10/14/2005  T:  10/14/2005  Job:  [id number]   cc:   [facility]   Darlin Priestly, MD  Fax: [phone]

## 2010-09-06 NOTE — Assessment & Plan Note (Signed)
OFFICE VISIT  Robert Burns, Robert Burns DOB:  02-Jul-1940                                       08/30/2010 CHART#:[id number]  This is a phone conversation note.  Patient was not seen in the office.  Patient called, wanting to schedule his redo surgery in the left thigh for Wednesday, June 13.  I phoned him to be certain he had no questions. He did see Dr. Michell Heinrich, who performed extensive CT scans and saw no evidence of any tumor in any other areas, including his liver into the abdominal area, etc.  The recent scan of his left leg revealed no flow reduction with some disk space between the interposition graft which I placed, where the tumor was located, and the distal stent placed by Dr. Allyson Sabal.  The plan is to reoperate on this area and resect more artery both proximally and distally.  He understands that the nerve would be densely adherent to this Gore-Tex graft and that this could lead to some nerve irritation and pain or dysfunction.  I have scheduled that for Wednesday, June 13, and his questions were answered.    [name], M.D. Electronically Signed  JDL/MEDQ  D:  08/30/2010  T:  08/31/2010  Job:  [id number]

## 2010-09-19 ENCOUNTER — Other Ambulatory Visit: Payer: Self-pay | Admitting: Vascular Surgery

## 2010-09-19 ENCOUNTER — Ambulatory Visit (HOSPITAL_COMMUNITY)
Admission: RE | Admit: 2010-09-19 | Discharge: 2010-09-19 | Disposition: A | Discharge status: Home / Self Care | Payer: Medicare HMO | Source: Ambulatory Visit | Attending: Vascular Surgery | Admitting: Vascular Surgery

## 2010-09-19 ENCOUNTER — Encounter (HOSPITAL_COMMUNITY)
Admission: RE | Admit: 2010-09-19 | Discharge: 2010-09-19 | Disposition: A | Discharge status: Home / Self Care | Payer: Medicare HMO | Source: Ambulatory Visit | Attending: Vascular Surgery | Admitting: Vascular Surgery

## 2010-09-19 DIAGNOSIS — Z01812 Encounter for preprocedural laboratory examination: Secondary | ICD-10-CM | POA: Insufficient documentation

## 2010-09-19 DIAGNOSIS — Z01818 Encounter for other preprocedural examination: Secondary | ICD-10-CM | POA: Insufficient documentation

## 2010-09-19 DIAGNOSIS — T82868A Thrombosis of vascular prosthetic devices, implants and grafts, initial encounter: Secondary | ICD-10-CM

## 2010-09-19 DIAGNOSIS — Z01810 Encounter for preprocedural cardiovascular examination: Secondary | ICD-10-CM | POA: Insufficient documentation

## 2010-09-19 LAB — CBC
MCH: 29.5 pg (ref 26.0–34.0)
MCHC: 34.4 g/dL (ref 30.0–36.0)
Platelets: 175 10*3/uL (ref 150–400)

## 2010-09-19 LAB — SURGICAL PCR SCREEN
MRSA, PCR: NEGATIVE
Staphylococcus aureus: NEGATIVE

## 2010-09-19 LAB — URINE MICROSCOPIC-ADD ON

## 2010-09-19 LAB — URINALYSIS, ROUTINE W REFLEX MICROSCOPIC
Bilirubin Urine: NEGATIVE
Glucose, UA: NEGATIVE mg/dL
Hgb urine dipstick: NEGATIVE
Ketones, ur: 15 mg/dL — AB
Protein, ur: 100 mg/dL — AB
Urobilinogen, UA: 0.2 mg/dL (ref 0.0–1.0)

## 2010-09-19 LAB — COMPREHENSIVE METABOLIC PANEL
ALT: 38 U/L (ref 0–53)
AST: 25 U/L (ref 0–37)
Albumin: 4 g/dL (ref 3.5–5.2)
Alkaline Phosphatase: 42 U/L (ref 39–117)
BUN: 18 mg/dL (ref 6–23)
Potassium: 4.1 mEq/L (ref 3.5–5.1)
Sodium: 144 mEq/L (ref 135–145)
Total Protein: 6.9 g/dL (ref 6.0–8.3)

## 2010-09-19 LAB — PROTIME-INR
INR: 0.94 (ref 0.00–1.49)
Prothrombin Time: 12.8 seconds (ref 11.6–15.2)

## 2010-09-20 ENCOUNTER — Ambulatory Visit: Payer: Medicare HMO | Admitting: Vascular Surgery

## 2010-09-21 ENCOUNTER — Inpatient Hospital Stay (HOSPITAL_COMMUNITY)
Admission: RE | Admit: 2010-09-21 | Discharge: 2010-09-23 | DRG: 254 | Disposition: A | Discharge status: Home / Self Care | Payer: Medicare HMO | Source: Ambulatory Visit | Attending: Vascular Surgery | Admitting: Vascular Surgery

## 2010-09-21 ENCOUNTER — Other Ambulatory Visit: Payer: Self-pay | Admitting: Vascular Surgery

## 2010-09-21 DIAGNOSIS — D481 Neoplasm of uncertain behavior of connective and other soft tissue: Secondary | ICD-10-CM

## 2010-09-21 DIAGNOSIS — I2510 Atherosclerotic heart disease of native coronary artery without angina pectoris: Secondary | ICD-10-CM | POA: Diagnosis present

## 2010-09-21 DIAGNOSIS — G4733 Obstructive sleep apnea (adult) (pediatric): Secondary | ICD-10-CM | POA: Diagnosis present

## 2010-09-21 DIAGNOSIS — I10 Essential (primary) hypertension: Secondary | ICD-10-CM | POA: Diagnosis present

## 2010-09-21 DIAGNOSIS — D1809 Hemangioma of other sites: Principal | ICD-10-CM | POA: Diagnosis present

## 2010-09-21 DIAGNOSIS — E119 Type 2 diabetes mellitus without complications: Secondary | ICD-10-CM | POA: Diagnosis present

## 2010-09-21 DIAGNOSIS — Z87891 Personal history of nicotine dependence: Secondary | ICD-10-CM

## 2010-09-21 DIAGNOSIS — Z79899 Other long term (current) drug therapy: Secondary | ICD-10-CM

## 2010-09-21 DIAGNOSIS — E785 Hyperlipidemia, unspecified: Secondary | ICD-10-CM | POA: Diagnosis present

## 2010-09-21 DIAGNOSIS — Z7982 Long term (current) use of aspirin: Secondary | ICD-10-CM

## 2010-09-21 DIAGNOSIS — Z951 Presence of aortocoronary bypass graft: Secondary | ICD-10-CM

## 2010-09-21 LAB — GLUCOSE, CAPILLARY: Glucose-Capillary: 163 mg/dL — ABNORMAL HIGH (ref 70–99)

## 2010-09-22 LAB — CROSSMATCH
ABO/RH(D): O POS
Unit division: 0
Unit division: 0

## 2010-09-22 LAB — GLUCOSE, CAPILLARY
Glucose-Capillary: 124 mg/dL — ABNORMAL HIGH (ref 70–99)
Glucose-Capillary: 136 mg/dL — ABNORMAL HIGH (ref 70–99)
Glucose-Capillary: 127 mg/dL — ABNORMAL HIGH (ref 70–99)

## 2010-09-22 LAB — BASIC METABOLIC PANEL
BUN: 13 mg/dL (ref 6–23)
Calcium: 8.9 mg/dL (ref 8.4–10.5)
GFR calc Af Amer: >60 mL/min (ref >60)
GFR calc non Af Amer: >60 mL/min (ref >60)
Glucose, Bld: 146 mg/dL — ABNORMAL HIGH (ref 70–99)
Potassium: 3.7 mEq/L (ref 3.5–5.1)
Sodium: 141 mEq/L (ref 135–145)

## 2010-09-22 LAB — CBC
HCT: 40.9 % (ref 39.0–52.0)
Hemoglobin: 13.9 g/dL (ref 13.0–17.0)
MCH: 29 pg (ref 26.0–34.0)
MCHC: 34 g/dL (ref 30.0–36.0)
RDW: 13.7 % (ref 11.5–15.5)

## 2010-09-23 LAB — GLUCOSE, CAPILLARY: Glucose-Capillary: 130 mg/dL — ABNORMAL HIGH (ref 70–99)

## 2010-09-26 NOTE — H&P (Signed)
  Robert Burns, Robert Burns NO.:  [id number]  MEDICAL RECORD NO.:  [id number]  LOCATION:  2028                         FACILITY:  [facility]  PHYSICIAN:  Quita Skye. Hart Rochester, M.D.  DATE OF BIRTH:  1940/11/17  DATE OF ADMISSION:  09/21/2010 DATE OF DISCHARGE:                             HISTORY & PHYSICAL   PREOPERATIVE DIAGNOSIS:  Possible positive margins from previous resection of superficial femoral artery tumor.  HISTORY OF PRESENT ILLNESS:  This 70 year old male patient was found to have a mass in the left superficial femoral artery which was previously resected by me on June 24, 2010.  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  Grossly, the margins were negative but microscopically a few weeks following surgery it was determined that it possibly might be a positive margin.  It was then decided to re- resect this area to give larger margins.  CHRONIC MEDICAL PROBLEMS: 1. Diabetes. 2. Hypertension. 3. Hyperlipidemia. 4. Coronary artery disease, previous coronary artery bypass grafting     in 2004. 5. Sleep apnea using CPAP. 6. Previous stenting of distal left superficial femoral artery.  SOCIAL HISTORY:  The patient is married, has 2 children, is retired, has not used tobacco in 37 years, does not use alcohol.  FAMILY HISTORY:  Positive for coronary artery disease in his mother, diabetes in his father's side of the family, negative for stroke.  REVIEW OF SYSTEMS:  Negative for chest pain, dyspnea on exertion. Denies any claudication symptoms.  All other systems are negative in complete review of systems.  PHYSICAL EXAMINATION:  VITAL SIGNS:  Blood pressure 150/60, heart rate is 80, respirations 14. GENERAL:  He is a well-developed, well-nourished male in no apparent distress.  Alert and oriented x3. HEENT:  Normal for age.  EOMs intact. LUNGS:  Clear to  auscultation.  No rhonchi or wheezing. CARDIOVASCULAR:  Regular rhythm.  No murmurs.  Carotid pulses are 3+, no bruits. ABDOMEN:  Soft, nontender with no masses. LOWER EXTREMITY:  3+ femoral, popliteal, posterior tibial pulse on the left, well-healed incision in the mid thigh.  Right leg has 3+ pulses throughout.  IMPRESSION:  Possible positive margins from previous resection of tumor in the left superficial femoral artery with an interposition Gore-Tex graft.  PLAN:  Re-resect the Gore-Tex graft and obtain more proximal and more distal margins.  Risks and benefits including nerve irritation or pain postoperatively have been thoroughly discussed with the patient who would like to proceed.     [name], M.D.     JDL/MEDQ  D:  09/21/2010  T:  09/22/2010  Job:  [id number]  Electronically Signed by Josephina Gip M.D. on 09/26/2010 11:41:51 AM

## 2010-09-26 NOTE — Op Note (Signed)
NAMEMACAULEY, MOSSBERG NO.:  [id number]  MEDICAL RECORD NO.:  [id number]  LOCATION:  2028                         FACILITY:  [facility]  PHYSICIAN:  Quita Skye. Hart Rochester, M.D.  DATE OF BIRTH:  15-Aug-1940  DATE OF PROCEDURE:  09/21/2010 DATE OF DISCHARGE:                              OPERATIVE REPORT   PREOPERATIVE DIAGNOSIS:  Positive margins from previous resection of the epithelioid hemangioendothelioma of left superficial femoral artery, previous resection.  POSTOPERATIVE DIAGNOSIS:  Positive margins from previous resection of the epithelioid hemangioendothelioma of left superficial femoral artery, previous resection.  OPERATION:  Repeat resection of interposition Gore-Tex graft, left superficial femoral artery to left superficial femoral artery with inclusion of more proximal and more distal segment of superficial femoral artery using 6-mm Gore-Tex.  SURGEON:  Quita Skye. Hart Rochester, MD  FIRST ASSISTANT:  Newton Pigg, PA  ANESTHESIA:  General endotracheal.  BRIEF HISTORY:  This patient had a localized tumor in the left superficial femoral artery resected a few months ago with interposition Gore-Tex graft with gross margins being clear but microscopic margins later revealed possible infringement on one of the margins either proximally or distally.  It was decided to re-resect this with extension of the proximal and distal margins.  DESCRIPTION OF PROCEDURE:  The patient was taken to the operating room and placed in supine position at which time satisfactory general endotracheal anesthesia was administered.  The left leg was prepped with Betadine scrub and solution and draped in routine sterile manner. Incision was made through the previous scar, extended proximally and distally in the left thigh.  There was dense scar tissue which was dissected free with #15 blade and Metzenbaum scissors.  Proximal superficial femoral artery was exposed about 4 cm or so  proximal to the proximal anastomosis of SFA to Gore-Tex.  Dissection then continued on the graft to try to avoid any injury to the adjacent nerve structures which were carefully preserved.  Gore-Tex graft was dissected down to the distal anastomosis with another 4-5 cm of vessel exposed.  There was a superficial femoral artery stent which was palpable just distal to this dissection which was avoided.  The patient was heparinized.  Artery was transected about 4-5 cm proximal to the anastomosis, both proximally and distally and the specimen removed from the table.  It was opened longitudinally on the back table and there was no evidence of any gross tumor within the specimen.  After spatulating the arteries proximally and distally, a 6-mm Gore-Tex graft was spatulated and end-to-end anastomosis was done on both sides with 6-0 Prolene.  There was some mild diffuse disease in the artery but it was widely patent.  Following completion of this with appropriate flushing, clamps were released. There was an excellent pulse in the graft with a palpableposterior tibial pulse in the ankle.  Protamine was given to reverse the heparin.  Following adequate hemostasis, the wounds were irrigated with saline, closed in layers with Vicryl in subcuticular fashion with Dermabond.  The patient was taken to the recovery room in stable condition.     [name], M.D.     JDL/MEDQ  D:  09/21/2010  T:  09/22/2010  Job:  [id number]  Electronically Signed by Josephina Gip M.D. on 09/26/2010 11:41:48 AM

## 2010-10-11 ENCOUNTER — Ambulatory Visit (INDEPENDENT_AMBULATORY_CARE_PROVIDER_SITE_OTHER): Payer: Medicare HMO | Admitting: Vascular Surgery

## 2010-10-11 DIAGNOSIS — I7389 Other specified peripheral vascular diseases: Secondary | ICD-10-CM

## 2010-10-11 NOTE — Assessment & Plan Note (Signed)
OFFICE VISIT  Robert Burns, Robert Burns DOB:  January 12, 1941                                       10/11/2010 CHART#:[id number]  The patient returns 3 weeks post a re-resection of his left superficial femoral artery which was done for an epithelioid hemangioendothelioma of the artery.  Initially this was performed in February of this year but because of questionable positive margins it was re-resected with a new graft placed.  Pathology report reveals the specimen to be totally free of any type of tumor or anything which represents neoplastic process. The patient has experienced numbness in the distal thigh down to the knee level as well as intermittent shooting sharp pains down the leg as the healing process continues.  He is having no claudication symptoms and is active.  PHYSICAL EXAM:  Blood pressure 140/78, heart rate 81, respirations 20. Left thigh incision has healed nicely.  He has 3+ popliteal and posterior tibial pulses with well-perfused left foot.  There is no edema around the incision.  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  He will continue to take some oxycodone 5 mg occasionally as well as Ambien 10 mg tablets occasionally for sleep since it is affecting his sleep pattern but with the understanding that he will not continue this nor take it on a regular basis.  He will return in 6 months with a scan of the graft and ABIs.    Robert Burns, M.D. Electronically Signed  JDL/MEDQ  D:  10/11/2010  T:  10/11/2010  Job:  [id number]

## 2010-10-26 NOTE — Discharge Summary (Signed)
NAMEZOHAIR, EPP NO.:  [id number]  MEDICAL RECORD NO.:  [id number]  LOCATION:  2028                         FACILITY:  [facility]  PHYSICIAN:  Quita Skye. Hart Rochester, M.D.  DATE OF BIRTH:  26-Sep-1940  DATE OF ADMISSION:  09/21/2010 DATE OF DISCHARGE:  09/23/2010                              DISCHARGE SUMMARY   ADMISSION DIAGNOSIS:  Positive margins from previous resection of superficial femoral artery tumor.  HISTORY OF PRESENT ILLNESS:  This is a 70 year old male patient who was found to have a mass in his left SFA which was previously resected by Dr. Hart Rochester on June 24, 2010.  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  Grossly, the margins were negative microscopically a few weeks later.  Following surgery, it was determined that it possibly might be a positive margin.  It was decided then to resect this area to give larger margins.  HOSPITAL COURSE:  The patient was admitted to the hospital and taken to the operating room on September 21, 2010 where he underwent repeat resection of interposition Gore-Tex graft, the left superficial femoral artery to left superficial femoral artery with inclusion of more proximal and more distal segment of superficial femoral artery using a 6-mm Gore-Tex graft.  He tolerated the procedure well and was transported to the PACU in satisfactory condition.  By postoperative day #1, he was doing well. His Foley was discontinued and he was out of bed ambulating that day. By postoperative day #2, he continues to do well.  His incisions were clean, dry, and intact and he has positive palpable pedal pulses. Otherwise, his postoperative course includes increasing ambulation as well as increasing intake of solids without difficulty.  DISCHARGE INSTRUCTIONS:  He is discharged home with extensive instructions on wound care and progressive ambulation.  He  is instructed not to drive or perform any heavy lifting for 1 month.  DISCHARGE DIAGNOSES: 1. 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     a.     Repeat resection of interposition Gore-Tex graft in the left      superficial femoral artery to left superficial femoral artery with      inclusion of more proximal and more distal segment of superficial      femoral artery.     b.     Resection of left superficial femoral artery mass on      June 02, 2010. 2. Diabetes. 3. Hypertension. 4. Hyperlipidemia. 5. Coronary artery disease.     a.     Status post coronary artery bypass graft in 2004. 6. Obstructive sleep apnea.     a.     Uses CPAP. 7. Previous stenting of distal left superficial femoral artery  DISCHARGE MEDICATIONS: 1. Oxycodone 5 mg 1-2 tablets p.o. q.6 h. p.r.n. pain, #30, no refill. 2. Amlodipine 10 mg p.o. at bedtime. 3. Aspirin 81 mg p.o. at bedtime. 4. Clonidine 0.3 mg p.o. b.i.d. 5. Vitamin B12 one p.o. daily. 6. Fish oil 1000 mg 3 capsules b.i.d. 7. Glipizide XL 5 mg p.o. q.a.m. 8. Glucosamine 1500 mg p.o.  b.i.d. 9. Lovastatin 40 mg p.o. b.i.d. 10.Metformin 1000 mg p.o. b.i.d. 11.Metoprolol 25 mg p.o. b.i.d. 12.Multivitamin p.o. daily. 13.Ranitidine 150 mg p.o. b.i.d. 14.Over-the-counter sleep aid at bedtime p.r.n. 15.Testosterone 1% cream 1 application daily p.r.n. 16.Tylenol Extra Strength 500 mg 2 tablets q.4 h. p.r.n. 17.Vitamin C over-the-counter 1 tablet p.o. daily.  FOLLOWUP:  The patient is to follow up with Dr. Hart Rochester in 2-3 weeks.     Newton Pigg, PA   ______________________________ [name], M.D.    SE/MEDQ  D:  09/23/2010  T:  09/23/2010  Job:  [id number]  Electronically Signed by Newton Pigg PA on 09/26/2010 03:36:30 PM Electronically Signed by Josephina Gip M.D. on 10/26/2010 02:55:41 PM

## 2011-03-15 ENCOUNTER — Encounter: Payer: Self-pay | Admitting: Vascular Surgery

## 2011-03-30 ENCOUNTER — Ambulatory Visit: Payer: Medicare HMO | Admitting: Oncology

## 2011-04-18 ENCOUNTER — Ambulatory Visit: Payer: Medicare HMO | Admitting: Vascular Surgery

## 2011-05-08 ENCOUNTER — Encounter: Payer: Self-pay | Admitting: Vascular Surgery

## 2011-05-09 ENCOUNTER — Encounter (INDEPENDENT_AMBULATORY_CARE_PROVIDER_SITE_OTHER): Payer: Medicare HMO | Admitting: *Deleted

## 2011-05-09 ENCOUNTER — Ambulatory Visit (INDEPENDENT_AMBULATORY_CARE_PROVIDER_SITE_OTHER): Payer: Medicare HMO | Admitting: Vascular Surgery

## 2011-05-09 ENCOUNTER — Encounter: Payer: Self-pay | Admitting: Vascular Surgery

## 2011-05-09 VITALS — BP 168/84 | HR 87 | Resp 20 | Ht 67.0 in | Wt 196.0 lb

## 2011-05-09 DIAGNOSIS — S75009A Unspecified injury of femoral artery, unspecified leg, initial encounter: Secondary | ICD-10-CM | POA: Insufficient documentation

## 2011-05-09 DIAGNOSIS — I70219 Atherosclerosis of native arteries of extremities with intermittent claudication, unspecified extremity: Secondary | ICD-10-CM

## 2011-05-09 DIAGNOSIS — D4989 Neoplasm of unspecified behavior of other specified sites: Secondary | ICD-10-CM

## 2011-05-09 DIAGNOSIS — Z48812 Encounter for surgical aftercare following surgery on the circulatory system: Secondary | ICD-10-CM

## 2011-05-09 DIAGNOSIS — I70212 Atherosclerosis of native arteries of extremities with intermittent claudication, left leg: Secondary | ICD-10-CM | POA: Insufficient documentation

## 2011-05-09 NOTE — Progress Notes (Signed)
Subjective:     Patient ID: Robert Burns, male   DOB: 1940-10-30, 71 y.o.   MRN: [id number]  HPI this 71 year old male patient returns today for further followup regarding his left superficial femoral artery interposition graft which I inserted in July of 2012. This was done for a benign or low-grade tumor in the superficial femoral artery which had malignant potential. It was a epithelioid hemangioendothelioma. The patient has done well since then with no claudication in either lower extremity. He also has a stent in his distal left superficial femoral artery inserted by Dr. Gery Pray in the past. His biggest complaint has been some numbness distal to the left thigh one and extending down into the medial calf.  Past Medical History  Diagnosis Date  . Diabetes mellitus   . Hypertension   . Arthritis   . Leg pain   . Hyperlipidemia   . OSA on CPAP   . CAD (coronary artery disease)     History  Substance Use Topics  . Smoking status: Former Smoker -- 10 years    Types: Cigarettes, Pipe    Quit date: 04/10/1973  . Smokeless tobacco: Never Used  . Alcohol Use: No    Family History  Problem Relation Age of Onset  . Coronary artery disease Mother   . Diabetes Father     Allergies  Allergen Reactions  . Ace Inhibitors   . Catapres   . Diltiazem Hcl   . Peanut-Containing Drug Products   . Ramipril     Current outpatient prescriptions:acetaminophen (TYLENOL) 500 MG tablet, Take 500 mg by mouth every 6 (six) hours as needed.  , Disp: , Rfl: ;  amLODipine (NORVASC) 10 MG tablet, Take 10 mg by mouth daily.  , Disp: , Rfl: ;  aspirin EC 81 MG tablet, Take 81 mg by mouth daily.  , Disp: , Rfl: ;  B Complex Vitamins (B COMPLEX-B12 PO), Take by mouth.  , Disp: , Rfl:  cloNIDine (CATAPRES) 0.3 MG tablet, Take 0.3 mg by mouth 2 (two) times daily.  , Disp: , Rfl: ;  Doxylamine Succinate, Sleep, (SLEEP AID PO), Take by mouth as needed.  , Disp: , Rfl: ;  glipiZIDE (GLUCOTROL) 5 MG tablet, Take 5  mg by mouth daily.  , Disp: , Rfl: ;  Glucosamine-Chondroit-Vit C-Mn (GLUCOSAMINE 1500 COMPLEX PO), Take by mouth 2 (two) times daily.  , Disp: , Rfl:  losartan-hydrochlorothiazide (HYZAAR) 100-25 MG per tablet, Take 1 tablet by mouth daily.  , Disp: , Rfl: ;  lovastatin (MEVACOR) 40 MG tablet, Take 40 mg by mouth at bedtime.  , Disp: , Rfl: ;  metFORMIN (GLUCOPHAGE) 1000 MG tablet, Take 1,000 mg by mouth 2 (two) times daily with a meal.  , Disp: , Rfl: ;  metoprolol tartrate (LOPRESSOR) 25 MG tablet, Take 25 mg by mouth 2 (two) times daily.  , Disp: , Rfl:  Multiple Vitamin (MULTIVITAMIN) capsule, Take 1 capsule by mouth daily.  , Disp: , Rfl: ;  Omega-3 Fatty Acids (FISH OIL) 1000 MG CAPS, Take 3,000 mg by mouth daily.  , Disp: , Rfl: ;  vitamin C (ASCORBIC ACID) 500 MG tablet, Take 500 mg by mouth 2 (two) times daily.  , Disp: , Rfl: ;  ranitidine (ZANTAC) 150 MG capsule, Take 150 mg by mouth 2 (two) times daily.  , Disp: , Rfl:   BP 168/84  Pulse 87  Resp 20  Ht 5\' 7"  (1.702 m)  Wt 196 lb (88.905 kg)  BMI 30.70 kg/m2  Body mass index is 30.70 kg/(m^2).        Review of Systems denies chest pain, dyspnea on exertion, PND, orthopnea, or hemoptysis. Does complain of occasional weakness and numbness in the arms or legs. Facial discomfort in both legs while lying flat. All other systems are negative and complete review of system     Objective:   Physical Exam blood pressure 160/84 heart rate 87 respirations 20 General well-developed well-nourished male no apparent stress alert and oriented x3 HEENT normal for age Lungs no rhonchi or wheezing Cardiovascular regular rhythm no murmurs carotid pulses 3+ multiple bruits Abdomen soft nontender with no masses Musculoskeletal 3 major 40s Skin freer rashes Neurologic normal Left leg with 3+ femoral and 2+ posterior tibial pulse palpable. Right leg with 3+ femoral one to 2+ posterior tibial pulse palpable. Left thigh incision remains well  healed. Both feet are well perfused.  Today I ordered a scan of the left superficial femoral artery to assess of the bypass graft. There is some slight elevation of velocities at the distal anastomosis of Gore-Tex to superficial femoral artery in the 230 cm/s range. There is also slight elevation of velocity within the stent distal to the bypass. ABI on the right leg is 0.78 down from 1.0 and left leg 0.95 identical to previous exams     Assessment:     Widely patent left superficial femoral to superficial femoral artery bypass for resection of artery for tumor   like decreased ABI right leg probably due to superficial femoral occlusive disease-asymptomatic at this time Plan:     Return in one year with duplex scan of left leg to again assess bypass graft and check ABI unless patient develops worsening symptoms in the upper

## 2011-05-15 NOTE — Procedures (Unsigned)
BYPASS GRAFT EVALUATION  INDICATION:  Left lower extremity bypass graft.  HISTORY: Diabetes:  Yes. Cardiac:  Yes. Hypertension:  Yes. Smoking:  Previous. Previous Surgery:  Left superficial femoral artery bypass graft on 06/02/2010 and 09/21/2010 with tumor removal, history of left distal SFA stent.  SINGLE LEVEL ARTERIAL EXAM                              RIGHT              LEFT Brachial: Anterior tibial: Posterior tibial: Peroneal: Ankle/brachial index:  PREVIOUS ABI:  Date:  RIGHT:  LEFT:  LOWER EXTREMITY BYPASS GRAFT DUPLEX EXAM:  DUPLEX: 1. Triphasic Doppler waveforms noted throughout the left superficial     femoral artery bypass graft and its native vessels. 2. Velocities of 232 cm/s and 208 cm/s noted in the left distal graft     anastomosis and the distal superficial femoral arteries,     respectively.  IMPRESSION: 1. Patent left superficial femoral artery bypass graft and stent with     elevated velocities, as described above. 2. Bilateral ankle brachial indices are noted on a separate report.  ___________________________________________ Quita Skye. Hart Rochester, M.D.  CH/MEDQ  D:  05/11/2011  T:  05/11/2011  Job:  [id number]

## 2011-05-18 ENCOUNTER — Other Ambulatory Visit: Payer: Self-pay | Admitting: *Deleted

## 2011-05-18 NOTE — Progress Notes (Signed)
Call from pt, he received an automated call about follow up with Dr. Truett Perna. Pt states he can not make this appt. Requests to r/s for 2-3 months. Pt reports he recently saw his surgeon and everything is OK.  Order sent to scheduler to change appt.  Dr. Truett Perna made aware.

## 2011-05-22 ENCOUNTER — Ambulatory Visit: Payer: Medicare HMO | Admitting: Oncology

## 2011-05-23 ENCOUNTER — Telehealth: Payer: Self-pay | Admitting: Oncology

## 2011-05-23 NOTE — Telephone Encounter (Signed)
called pts home lmovm for appt in may2013 and to rtn call to confirm appt

## 2011-07-27 ENCOUNTER — Encounter: Payer: Self-pay | Admitting: Oncology

## 2011-07-27 NOTE — Progress Notes (Signed)
Patient stop by my office today,because he had a question about his bill,so I call the billing dept for him and he did speak with someone,and they said they would mail it out to him.

## 2011-08-15 ENCOUNTER — Ambulatory Visit (HOSPITAL_BASED_OUTPATIENT_CLINIC_OR_DEPARTMENT_OTHER): Payer: Medicare HMO | Admitting: Oncology

## 2011-08-15 VITALS — BP 142/74 | HR 88 | Temp 97.4°F | Ht 67.0 in | Wt 195.3 lb

## 2011-08-15 DIAGNOSIS — D1801 Hemangioma of skin and subcutaneous tissue: Secondary | ICD-10-CM

## 2011-08-15 DIAGNOSIS — C499 Malignant neoplasm of connective and soft tissue, unspecified: Secondary | ICD-10-CM

## 2011-08-15 NOTE — Progress Notes (Signed)
OFFICE PROGRESS NOTE   INTERVAL HISTORY:   He was last seen at the cancer Center in April of 2012. He underwent a reexcision procedure by Dr. Hart Rochester and reports no tumor was found. He saw Dr. Michell Heinrich and did not receive adjuvant radiation.  He feels well. He has noted increased numbness at the distal left thigh and around the knee. No mass, pain, or leg swelling. He has arthralgias. Good appetite. No other complaint.  Objective:  Vital signs in last 24 hours:  Blood pressure 142/74, pulse 88, temperature 97.4 F (36.3 C), temperature source Oral, height 5\' 7"  (1.702 m), weight 195 lb 4.8 oz (88.587 kg).    HEENT: Neck without mass Lymphatics: No cervical, supraclavicular, axillary, or inguinal lymph nodes, prominent left axillary fat pad versus a medial axillary lipoma, lipoma at the right posterior axilla surrounding a scar Resp: Lungs clear bilaterally Cardio: Regular rate and rhythm GI: No hepatosplenomegaly, no mass Vascular: No leg edema Musculoskeletal: No evidence of recurrent tumor at the left thigh scar      Medications: I have reviewed the patient's current medications.  Assessment/Plan: 1. 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 Status post a reexcision procedure in June of 2012 with negative pathology. 2. Diabetes. 3. History of coronary artery disease. 4. Hypertension. 5. Status post pacemaker placement. 6. Sleep apnea. 7. Apparent "lipomas" at the right upper back and left medial axilla/chest wall. 8. Numbness at the distal left thigh-likely related from surgical nerve injury   Disposition:  He remains in clinical remission from the hemangioendothelioma. He plans to continue clinical followup with Dr. Hart Rochester. A followup appointment was not scheduled at the cancer Center. I will see him in the future as needed.   Thornton Papas, MD  08/15/2011  12:11 PM

## 2012-04-26 ENCOUNTER — Other Ambulatory Visit: Payer: Self-pay | Admitting: *Deleted

## 2012-04-26 DIAGNOSIS — Z48816 Encounter for surgical aftercare following surgery on the genitourinary system: Secondary | ICD-10-CM

## 2012-04-26 DIAGNOSIS — I739 Peripheral vascular disease, unspecified: Secondary | ICD-10-CM

## 2012-05-06 ENCOUNTER — Encounter: Payer: Self-pay | Admitting: Neurosurgery

## 2012-05-07 ENCOUNTER — Encounter (INDEPENDENT_AMBULATORY_CARE_PROVIDER_SITE_OTHER): Payer: Medicare Other | Admitting: *Deleted

## 2012-05-07 ENCOUNTER — Encounter: Payer: Self-pay | Admitting: Neurosurgery

## 2012-05-07 ENCOUNTER — Ambulatory Visit (INDEPENDENT_AMBULATORY_CARE_PROVIDER_SITE_OTHER): Payer: Medicare Other | Admitting: Neurosurgery

## 2012-05-07 ENCOUNTER — Other Ambulatory Visit: Payer: Self-pay | Admitting: *Deleted

## 2012-05-07 VITALS — BP 126/74 | HR 66 | Resp 66 | Ht 67.5 in | Wt 193.7 lb

## 2012-05-07 DIAGNOSIS — I70219 Atherosclerosis of native arteries of extremities with intermittent claudication, unspecified extremity: Secondary | ICD-10-CM

## 2012-05-07 DIAGNOSIS — Z48816 Encounter for surgical aftercare following surgery on the genitourinary system: Secondary | ICD-10-CM

## 2012-05-07 DIAGNOSIS — Z48812 Encounter for surgical aftercare following surgery on the circulatory system: Secondary | ICD-10-CM

## 2012-05-07 DIAGNOSIS — I739 Peripheral vascular disease, unspecified: Secondary | ICD-10-CM

## 2012-05-07 DIAGNOSIS — M79609 Pain in unspecified limb: Secondary | ICD-10-CM

## 2012-05-07 DIAGNOSIS — M79606 Pain in leg, unspecified: Secondary | ICD-10-CM

## 2012-05-07 NOTE — Progress Notes (Signed)
[facility] Office Note  CC: PAD surveillance Referring Physician: Hart Rochester  History of Present Illness:  72 year old male patient of Dr. Hart Rochester who is status post a left SFA interpositional graft due to a tumor resection. There is an extension of the graft and 2012, the patient also has a distal left SFA stent. The patient does complain of some medial 5 pain that has been there since his surgery. The patient does not describe true claudication although if he walks great distances the pain does worsen. The patient denies true vascular rest pain and does have a history of sciatic nerve pain.  Past Medical History  Diagnosis Date  . Diabetes mellitus   . Hypertension   . Arthritis   . Leg pain   . Hyperlipidemia   . OSA on CPAP   . CAD (coronary artery disease)     ROS: [x]  Positive   [ ]  Denies    General: [ ]  Weight loss, [ ]  Fever, [ ]  chills Neurologic: [ ]  Dizziness, [ ]  Blackouts, [ ]  Seizure [ ]  Stroke, [ ]  "Mini stroke", [ ]  Slurred speech, [ ]  Temporary blindness; [ ]  weakness in arms or legs, [ ]  Hoarseness Cardiac: [ ]  Chest pain/pressure, [ ]  Shortness of breath at rest [ ]  Shortness of breath with exertion, [ ]  Atrial fibrillation or irregular heartbeat Vascular: [ ]  Pain in legs with walking, [ ]  Pain in legs at rest, [ ]  Pain in legs at night,  [ ]  Non-healing ulcer, [ ]  Blood clot in vein/DVT,   Pulmonary: [ ]  Home oxygen, [ ]  Productive cough, [ ]  Coughing up blood, [ ]  Asthma,  [ ]  Wheezing Musculoskeletal:  [ ]  Arthritis, [ ]  Low back pain, [ ]  Joint pain Hematologic: [ ]  Easy Bruising, [ ]  Anemia; [ ]  Hepatitis Gastrointestinal: [ ]  Blood in stool, [ ]  Gastroesophageal Reflux/heartburn, [ ]  Trouble swallowing Urinary: [ ]  chronic Kidney disease, [ ]  on HD - [ ]  MWF or [ ]  TTHS, [ ]  Burning with urination, [ ]  Difficulty urinating Skin: [ ]  Rashes, [ ]  Wounds Psychological: [ ]  Anxiety, [ ]  Depression   Social History History    Substance Use Topics  . Smoking status: Former Smoker -- 10 years    Types: Cigarettes, Pipe    Quit date: 04/10/1973  . Smokeless tobacco: Never Used  . Alcohol Use: No    Family History Family History  Problem Relation Age of Onset  . Coronary artery disease Mother   . Diabetes Father     Allergies  Allergen Reactions  . Ace Inhibitors     Cough  . Clonidine Hcl     Patch only - skin irritation  . Diltiazem Hcl   . Peanut-Containing Drug Products   . Ramipril     Current Outpatient Prescriptions  Medication Sig Dispense Refill  . acetaminophen (TYLENOL) 500 MG tablet Take 500 mg by mouth every 6 (six) hours as needed.        [name] amLODipine (NORVASC) 10 MG tablet Take 10 mg by mouth daily.        [name] aspirin EC 81 MG tablet Take 81 mg by mouth daily.        . B Complex Vitamins (B COMPLEX-B12 PO) Take by mouth.        . cloNIDine (CATAPRES) 0.3 MG tablet Take 0.3 mg by mouth 2 (two) times daily.        . Doxylamine  Succinate, Sleep, (SLEEP AID PO) Take by mouth as needed.        [name] glipiZIDE (GLUCOTROL) 5 MG tablet Take 5 mg by mouth daily.        . Glucosamine-Chondroit-Vit C-Mn (GLUCOSAMINE 1500 COMPLEX PO) Take by mouth 2 (two) times daily.        [name] losartan-hydrochlorothiazide (HYZAAR) 100-25 MG per tablet Take 1 tablet by mouth daily.        [name] lovastatin (MEVACOR) 40 MG tablet Take 40 mg by mouth at bedtime.        . metFORMIN (GLUCOPHAGE) 1000 MG tablet Take 1,000 mg by mouth 2 (two) times daily with a meal.        . metoprolol tartrate (LOPRESSOR) 25 MG tablet Take 25 mg by mouth 2 (two) times daily.        . Multiple Vitamin (MULTIVITAMIN) capsule Take 1 capsule by mouth daily.        [name] NIACIN CR PO Take 1,500 mg by mouth daily.      . Omega-3 Fatty Acids (FISH OIL) 1000 MG CAPS Take 3,000 mg by mouth daily.        . ranitidine (ZANTAC) 150 MG capsule Take 150 mg by mouth 2 (two) times daily.        . vitamin C (ASCORBIC ACID) 500 MG tablet Take 500 mg by mouth 2  (two) times daily.          Physical Examination  Filed Vitals:   05/07/12 1033  BP: 126/74  Pulse: 66  Resp: 66    Body mass index is 29.89 kg/(m^2).  General:  WDWN in NAD Gait: Normal HEENT: WNL Eyes: Pupils equal Pulmonary: normal non-labored breathing , without Rales, rhonchi,  wheezing Cardiac: RRR, without  Murmurs, rubs or gallops; No carotid bruits Abdomen: soft, NT, no masses Skin: no rashes, ulcers noted Vascular Exam/Pulses: Palpable femoral pulses bilaterally, the patient has palpable PT and DP pulses bilaterally  Extremities without ischemic changes, no Gangrene , no cellulitis; no open wounds;  Musculoskeletal: no muscle wasting or atrophy  Neurologic: A&O X 3; Appropriate Affect ; SENSATION: normal; MOTOR FUNCTION:  moving all extremities equally. Speech is fluent/normal  Non-Invasive Vascular Imaging: ABIs today are 0.83 on the right and biphasic, 1.02 and triphasic on the left with a patent graft with slight elevation in velocities at the distal anastomosis.  ASSESSMENT/PLAN: This is a patient with medial thigh pain that is most likely due to the surgery itself. The patient is ambulatory and the pain is not lifestyle limiting. The patient will followup in one year with repeat graft duplex as well as ABIs. The patient's questions were encouraged and answered, he is in agreement with this plan.  Lauree Chandler ANP  Clinic M.D.: Hart Rochester

## 2012-09-19 ENCOUNTER — Other Ambulatory Visit (HOSPITAL_COMMUNITY): Payer: Self-pay | Admitting: Cardiovascular Disease

## 2012-09-19 ENCOUNTER — Encounter (HOSPITAL_COMMUNITY): Payer: Self-pay | Admitting: Cardiovascular Disease

## 2012-09-19 DIAGNOSIS — I739 Peripheral vascular disease, unspecified: Secondary | ICD-10-CM

## 2012-09-27 ENCOUNTER — Ambulatory Visit (HOSPITAL_COMMUNITY)
Admission: RE | Admit: 2012-09-27 | Discharge: 2012-09-27 | Disposition: A | Discharge status: Home / Self Care | Payer: Medicare Other | Source: Ambulatory Visit | Attending: Internal Medicine | Admitting: Internal Medicine

## 2012-09-27 DIAGNOSIS — I70219 Atherosclerosis of native arteries of extremities with intermittent claudication, unspecified extremity: Secondary | ICD-10-CM

## 2012-09-27 DIAGNOSIS — I739 Peripheral vascular disease, unspecified: Secondary | ICD-10-CM | POA: Insufficient documentation

## 2012-09-27 NOTE — Progress Notes (Signed)
Arterial Lower Ext. Completed. Marilynne Halsted, RDMS, RVT

## 2012-11-04 ENCOUNTER — Encounter: Payer: Self-pay | Admitting: Cardiology

## 2012-11-04 DIAGNOSIS — E1169 Type 2 diabetes mellitus with other specified complication: Secondary | ICD-10-CM | POA: Insufficient documentation

## 2012-11-04 DIAGNOSIS — I2510 Atherosclerotic heart disease of native coronary artery without angina pectoris: Secondary | ICD-10-CM | POA: Insufficient documentation

## 2012-11-04 DIAGNOSIS — E785 Hyperlipidemia, unspecified: Secondary | ICD-10-CM

## 2012-11-04 DIAGNOSIS — I495 Sick sinus syndrome: Secondary | ICD-10-CM

## 2012-11-04 DIAGNOSIS — I10 Essential (primary) hypertension: Secondary | ICD-10-CM | POA: Insufficient documentation

## 2012-11-04 DIAGNOSIS — Z9989 Dependence on other enabling machines and devices: Secondary | ICD-10-CM

## 2012-11-04 DIAGNOSIS — G4733 Obstructive sleep apnea (adult) (pediatric): Secondary | ICD-10-CM

## 2012-11-06 ENCOUNTER — Encounter: Payer: Self-pay | Admitting: Cardiovascular Disease

## 2012-11-07 ENCOUNTER — Ambulatory Visit (INDEPENDENT_AMBULATORY_CARE_PROVIDER_SITE_OTHER): Payer: Medicare Other | Admitting: Cardiovascular Disease

## 2012-11-07 ENCOUNTER — Encounter: Payer: Self-pay | Admitting: Cardiovascular Disease

## 2012-11-07 VITALS — BP 128/72 | HR 88 | Ht 67.0 in | Wt 194.0 lb

## 2012-11-07 DIAGNOSIS — E785 Hyperlipidemia, unspecified: Secondary | ICD-10-CM

## 2012-11-07 DIAGNOSIS — I2510 Atherosclerotic heart disease of native coronary artery without angina pectoris: Secondary | ICD-10-CM

## 2012-11-07 DIAGNOSIS — I70219 Atherosclerosis of native arteries of extremities with intermittent claudication, unspecified extremity: Secondary | ICD-10-CM

## 2012-11-07 DIAGNOSIS — I495 Sick sinus syndrome: Secondary | ICD-10-CM

## 2012-11-07 LAB — PACEMAKER DEVICE OBSERVATION

## 2012-11-07 NOTE — Assessment & Plan Note (Signed)
Status post coronary bypass grafting in 2004 with a LIMA to his LAD, a vein to diagonal branch, sequential vein to the ramus and OM branch and a vein to the PDA. His last functional study performed 07/28/11 was entirely normal. He denies chest pain or shortness of breath.

## 2012-11-07 NOTE — Assessment & Plan Note (Signed)
Status post left SFA stenting back in 2004. He had a mass in his left upper thigh that was surrounding his left SFA. This was resected by Dr. Hart Rochester with an interposition graft. At the time of angiography I found no blockage in his right lower extremities though recent Dopplers suggesting of a right ABI of 0.83 with a high-frequency signal in his distal right SFA. He said this by Doppler before which is does not correlate with his anatomy or symptoms. We'll continue to follow him semiannually by duplex ultrasound

## 2012-11-07 NOTE — Progress Notes (Signed)
In office pacemaker interrogation. Normal device function. No changes made this session. 

## 2012-11-07 NOTE — Assessment & Plan Note (Signed)
On statin therapy followed by his PCP who recently decreased his lovastatin from 40 mg to 20 mg because of an excellent profile and suspected side effects

## 2012-11-07 NOTE — Assessment & Plan Note (Signed)
His pacemaker was interrogated today and found to be operating properly. He'll be set up to see Dr. Royann Shivers  in the near future

## 2012-11-07 NOTE — Patient Instructions (Addendum)
Your physician recommends that you schedule a follow-up appointment in: 3 months to see Dr.Croitoru for your next pacemaker interrogation   Your physician wants you to follow-up in: 6 months with an extender and 12 months with Dr Allyson Sabal. You will receive a reminder letter in the mail two months in advance. If you don't receive a letter, please call our office to schedule the follow-up appointment.   You will need lower extremity dopplers in 6 months.

## 2012-11-07 NOTE — Progress Notes (Signed)
11/07/2012 Robert Burns   March 24, 1941  [id number]  Primary Physician Dalbert Mayotte, MD Primary Cardiologist: Runell Gess MD [name]   HPI:  The patient is a very pleasant 72 year old, married Caucasian male, father of 2, grandfather to 3 grandchildren who is accompanied by his wife Robert Burns who is also a patient of mine. I last saw him 9 months ago. He has a history of CAD status post coronary artery bypass grafting March 2004 with a LIMA to his LAD, a vein to a diagonal branch, a sequential vein to a ramus and OM branch, as well as a vein to the PDA. Last functional study performed July 28, 2011, was entirely normal. 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 He had a pacemaker placed for sick sinus syndrome November 2008 followed by Dr. Royann Shivers. He has obstructive sleep apnea on CPAP. He complain of left thigh pain and I angiogram'd him revealing patent arteries though he did have a space-occupying lesion which was removed by Dr. Hart Rochester with placement of an interposition 6-mm Gore-Tex graft. The pathology was uncertain. 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 Dr. Drue Second follows his lipid profile. His last Doppler study performed in our office June 2013 revealed right ABI 0.85 with a high-frequency signal in the distal right common femoral and mid right SFA and left ABI of 1.1 with patent stent.  Since I saw him back 06/27/12 he has done well. He denies chest pain, shortness of breath or claudication.     Current Outpatient Prescriptions  Medication Sig Dispense Refill  . acetaminophen (TYLENOL) 500 MG tablet Take 500 mg by mouth every 6 (six) hours as needed.        [name] amLODipine (NORVASC) 10 MG tablet Take 10 mg by mouth daily.        [name] aspirin EC 81 MG tablet Take 81 mg by mouth daily.        . B Complex Vitamins (B COMPLEX-B12 PO) Take by mouth.        . cloNIDine (CATAPRES) 0.3 MG tablet Take 0.3 mg by mouth 2 (two) times daily.         . Doxylamine Succinate, Sleep, (SLEEP AID PO) Take by mouth as needed.        [name] glipiZIDE (GLUCOTROL) 5 MG tablet Take 5 mg by mouth daily.        . Glucosamine-Chondroit-Vit C-Mn (GLUCOSAMINE 1500 COMPLEX PO) Take by mouth 2 (two) times daily.        [name] losartan-hydrochlorothiazide (HYZAAR) 100-25 MG per tablet Take 1 tablet by mouth daily.        [name] lovastatin (MEVACOR) 40 MG tablet Take 40 mg by mouth at bedtime.        . metFORMIN (GLUCOPHAGE) 1000 MG tablet Take 1,000 mg by mouth 2 (two) times daily with a meal.        . metoprolol tartrate (LOPRESSOR) 25 MG tablet Take 25 mg by mouth 2 (two) times daily.        . Multiple Vitamin (MULTIVITAMIN) capsule Take 1 capsule by mouth daily.        . Omega-3 Fatty Acids (FISH OIL) 1000 MG CAPS Take 3,000 mg by mouth daily.        . vitamin C (ASCORBIC ACID) 500 MG tablet Take 500 mg by mouth 2 (two) times daily.         No current facility-administered medications for this visit.  Allergies  Allergen Reactions  . Ace Inhibitors     Cough  . Clonidine Hcl     Patch only - skin irritation  . Coreg (Carvedilol) Itching  . Diltiazem Hcl   . Mobic (Meloxicam)     GI upset  . Peanut-Containing Drug Products   . Ramipril     History   Social History  . Marital Status: Married    Spouse Name: N/A    Number of Children: N/A  . Years of Education: N/A   Occupational History  . Not on file.   Social History Main Topics  . Smoking status: Former Smoker -- 10 years    Types: Cigarettes, Pipe    Quit date: 04/10/1973  . Smokeless tobacco: Never Used  . Alcohol Use: No  . Drug Use: Not on file  . Sexually Active: Not on file   Other Topics Concern  . Not on file   Social History Narrative  . No narrative on file     Review of Systems: General: negative for chills, fever, night sweats or weight changes.  Cardiovascular: negative for chest pain, dyspnea on exertion, edema, orthopnea, palpitations, paroxysmal nocturnal  dyspnea or shortness of breath Dermatological: negative for rash Respiratory: negative for cough or wheezing Urologic: negative for hematuria Abdominal: negative for nausea, vomiting, diarrhea, bright red blood per rectum, melena, or hematemesis Neurologic: negative for visual changes, syncope, or dizziness All other systems reviewed and are otherwise negative except as noted above.    Blood pressure 128/72, pulse 88, height 5\' 7"  (1.702 m), weight 194 lb (87.998 kg).  General appearance: alert and no distress Neck: no adenopathy, no carotid bruit, no JVD, supple, symmetrical, trachea midline and thyroid not enlarged, symmetric, no tenderness/mass/nodules Lungs: clear to auscultation bilaterally Heart: regular rate and rhythm, S1, S2 normal, no murmur, click, rub or gallop Extremities: extremities normal, atraumatic, no cyanosis or edema  EKG not performed today show his pacemaker did undergo interrogation  ASSESSMENT AND PLAN:   SSS (sick sinus syndrome), medtronic adapta His pacemaker was interrogated today and found to be operating properly. He'll be set up to see Dr. Royann Shivers  in the near future  CAD (coronary artery disease), Hx CABG 2004 LIMA-LAD, VG-DIAG BRANCH; seq VG -ramus & OM branch; VG-PDA  Status post coronary bypass grafting in 2004 with a LIMA to his LAD, a vein to diagonal branch, sequential vein to the ramus and OM branch and a vein to the PDA. His last functional study performed 07/28/11 was entirely normal. He denies chest pain or shortness of breath.  Hyperlipidemia On statin therapy followed by his PCP who recently decreased his lovastatin from 40 mg to 20 mg because of an excellent profile and suspected side effects  Atherosclerosis of native arteries of the extremities with intermittent claudication Status post left SFA stenting back in 2004. He had a mass in his left upper thigh that was surrounding his left SFA. This was resected by Dr. Hart Rochester with an  interposition graft. At the time of angiography I found no blockage in his right lower extremities though recent Dopplers suggesting of a right ABI of 0.83 with a high-frequency signal in his distal right SFA. He said this by Doppler before which is does not correlate with his anatomy or symptoms. We'll continue to follow him semiannually by duplex ultrasound      Runell Gess MD [facility] 11/07/2012 11:46 AM

## 2013-02-13 ENCOUNTER — Ambulatory Visit (INDEPENDENT_AMBULATORY_CARE_PROVIDER_SITE_OTHER): Payer: Medicare Other | Admitting: Cardiovascular Disease

## 2013-02-13 ENCOUNTER — Encounter: Payer: Self-pay | Admitting: Cardiovascular Disease

## 2013-02-13 VITALS — BP 137/80 | HR 81 | Ht 68.0 in | Wt 199.7 lb

## 2013-02-13 DIAGNOSIS — Z9989 Dependence on other enabling machines and devices: Secondary | ICD-10-CM

## 2013-02-13 DIAGNOSIS — I10 Essential (primary) hypertension: Secondary | ICD-10-CM

## 2013-02-13 DIAGNOSIS — G4733 Obstructive sleep apnea (adult) (pediatric): Secondary | ICD-10-CM

## 2013-02-13 DIAGNOSIS — I495 Sick sinus syndrome: Secondary | ICD-10-CM

## 2013-02-13 DIAGNOSIS — I2510 Atherosclerotic heart disease of native coronary artery without angina pectoris: Secondary | ICD-10-CM

## 2013-02-13 DIAGNOSIS — I70219 Atherosclerosis of native arteries of extremities with intermittent claudication, unspecified extremity: Secondary | ICD-10-CM

## 2013-02-13 LAB — PACEMAKER DEVICE OBSERVATION

## 2013-02-13 NOTE — Patient Instructions (Signed)
Remote monitoring is used to monitor your pacemaker from home. This monitoring reduces the number of office visits required to check your device to one time per year. It allows [place] to keep an eye on the functioning of your device to ensure it is working properly. You are scheduled for a device check from home on 05-19-2013. You may send your transmission at any time that day. If you have a wireless device, the transmission will be sent automatically. After your physician reviews your transmission, you will receive a postcard with your next transmission date.  Your physician recommends that you schedule a follow-up appointment in:12 months

## 2013-02-16 NOTE — Progress Notes (Signed)
Patient ID: Robert Burns, male   DOB: 14-May-1940, 72 y.o.   MRN: [id number]      Reason for office visit CAD, PAD, pacemaker check  Robert Burns is a 72 year old patient of Dr. Nanetta Batty who has an extensive history of coronary and peripheral arterial problems, obstructive sleep apnea, diabetes mellitus, hyperlipidemia, hypertension and has a dual-chamber permanent pacemaker implanted for sick sinus syndrome in 2007 (Medtronic Adapta).  He has not had any recent cardiovascular problems and has not had any cardiac symptoms other than the mild bilateral ankle swelling. He is receiving treatment for precancerous lesions on his face and scalp with imiquimod and this gives him a lot of "flulike symptoms". He mostly complains of fatigue, dry mouth and constipation. He requires 5 different agents for blood pressure control including a high dose of clonidine.  Interrogation of his pacemaker shows normal device function. He has virtually 100% atrial pacing but almost no ventricular pacing. His heart rate histogram distribution is very normal suggesting that his fatigue cannot be attributed to chronotropic incompetence. No tachycardia arrhythmia has been detected.  Recent Doppler ultrasonography of the lower extremities shows a high-grade stenosis in the distal right superficial femoral artery with monophasic flow in the popliteal artery but with three-vessel runoff. The ABI was 0.83. He does not have claudication. On the contralateral side he has a Gore-Tex graft reconstruction of the superficial femoral artery following excision of a mass and has no evidence of obstructive lesions with an ABI of 1.1   His most recent nuclear stress test in April of 2013 showed normal myocardial perfusion patent and normal left ventricular systolic function he has not required cardiac catheterization since 2004. She underwent bypass surgery at that time with LIMA to LAD, SVG to first diagonal, sequential SVG to ramus and  OM1 and SVG to PDA. He denies angina pectoris   Allergies  Allergen Reactions  . Ace Inhibitors     Cough  . Clonidine Hcl     Patch only - skin irritation  . Coreg [Carvedilol] Itching  . Diltiazem Hcl   . Mobic [Meloxicam]     GI upset  . Peanut-Containing Drug Products   . Ramipril     Current Outpatient Prescriptions  Medication Sig Dispense Refill  . acetaminophen (TYLENOL) 500 MG tablet Take 500 mg by mouth every 6 (six) hours as needed.        [name] amLODipine (NORVASC) 10 MG tablet Take 10 mg by mouth daily.        [name] aspirin EC 81 MG tablet Take 81 mg by mouth daily.        . B Complex Vitamins (B COMPLEX-B12 PO) Take by mouth.        . cloNIDine (CATAPRES) 0.3 MG tablet Take 0.3 mg by mouth 2 (two) times daily.        . Doxylamine Succinate, Sleep, (SLEEP AID PO) Take by mouth as needed.        [name] glipiZIDE (GLUCOTROL) 5 MG tablet Take 5 mg by mouth daily.        . Glucosamine-Chondroit-Vit C-Mn (GLUCOSAMINE 1500 COMPLEX PO) Take by mouth 2 (two) times daily.        [name] losartan-hydrochlorothiazide (HYZAAR) 100-25 MG per tablet Take 1 tablet by mouth daily.        [name] lovastatin (MEVACOR) 40 MG tablet Take 40 mg by mouth at bedtime.        . metFORMIN (GLUCOPHAGE) 1000 MG tablet Take 1,000 mg by mouth  2 (two) times daily with a meal.        . metoprolol tartrate (LOPRESSOR) 25 MG tablet Take 25 mg by mouth 2 (two) times daily.        . Multiple Vitamin (MULTIVITAMIN) capsule Take 1 capsule by mouth daily.        . Omega-3 Fatty Acids (FISH OIL) 1000 MG CAPS Take 3,000 mg by mouth daily.        . vitamin C (ASCORBIC ACID) 500 MG tablet Take 500 mg by mouth 2 (two) times daily.        [name] gabapentin (NEURONTIN) 100 MG capsule Take 100 mg by mouth 3 (three) times daily.      . imiquimod (ALDARA) 5 % cream Apply topically daily. Apply daily x 5 days then hold x 2 days then repeat.       No current facility-administered medications for this visit.    Past Medical History  Diagnosis  Date  . Diabetes mellitus   . Hypertension   . Arthritis   . Leg pain   . Hyperlipidemia   . OSA on CPAP   . CAD (coronary artery disease)     s/p CABG  06/2002; last nuc 07/18/11 normal EF and negative for ischemia  . SSS (sick sinus syndrome)     Medtronic Adapta, 10/13/05  . 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 Coronary artery bypass graft  06/2002    x5, LIMA-LAD;VG- Diag; seq VG- ramus & OM branch; VG-PDA  . Tonsillectomy and adenoidectomy    . 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 Tumor excision      benign tumor removed from under right shoulder  . Pacemaker insertion  10/13/05    right side, medtronic Adapta  . Knee surgery      right  . Foot surgery      right  . Femoral artery stent      left  . Epitheloid hemanioendotheliomau      resection of Lt SFA wth interposition of Gore-Tex graft    Family History  Problem Relation Age of Onset  . Coronary artery disease Mother   . Heart attack Mother   . Diabetes Father   . Hypertension Sister   . Diabetes Sister     History   Social History  . Marital Status: Married    Spouse Name: N/A    Number of Children: N/A  . Years of Education: N/A   Occupational History  . Not on file.   Social History Main Topics  . Smoking status: Former Smoker -- 10 years    Types: Cigarettes, Pipe    Quit date: 04/10/1973  . Smokeless tobacco: Never Used  . Alcohol Use: No  . Drug Use: Not on file  . Sexual Activity: Not on file   Other Topics Concern  . Not on file   Social History Narrative  . No narrative on file    Review of systems: Complains of fatigue but does not have dyspnea, chest pain or intermittent claudication. He has very mild ankle edema. The patient specifically denies any chest pain at rest or with exertion, dyspnea at rest or with  exertion, orthopnea, paroxysmal nocturnal dyspnea, syncope, palpitations, focal neurological deficits, intermittent claudication, unexplained weight gain, cough, hemoptysis or wheezing.  The  patient also denies abdominal pain, nausea, vomiting, dysphagia, diarrhea, constipation, polyuria, polydipsia, dysuria, hematuria, frequency, urgency, abnormal bleeding or bruising, fever, chills, unexpected weight changes, mood swings, change in skin or hair texture, change in voice quality, auditory or visual problems, allergic reactions or rashes, new musculoskeletal complaints other than usual "aches and pains".  PHYSICAL EXAM BP 137/80  Pulse 81  Ht 5\' 8"  (1.727 m)  Wt 199 lb 11.2 oz (90.583 kg)  BMI 30.37 kg/m2  General: Alert, oriented x3, no distress Head: no evidence of trauma, PERRL, EOMI, no exophtalmos or lid lag, no myxedema, no xanthelasma; normal ears, nose and oropharynx Neck: normal jugular venous pulsations and no hepatojugular reflux; brisk carotid pulses without delay and no carotid bruits Chest: clear to auscultation, no signs of consolidation by percussion or palpation, normal fremitus, symmetrical and full respiratory excursions; Pacemaker site appears healthy Cardiovascular: normal position and quality of the apical impulse, regular rhythm, normal first and second heart sounds, no murmurs, rubs or gallops Abdomen: no tenderness or distention, no masses by palpation, no abnormal pulsatility or arterial bruits, normal bowel sounds, no hepatosplenomegaly Extremities: no clubbing, cyanosis or edema; 2+ radial, ulnar and brachial pulses bilaterally; 2+ right femoral, posterior tibial and dorsalis pedis pulses; 2+ left femoral, posterior tibial and dorsalis pedis pulses; no subclavian or femoral bruits Neurological: grossly nonfocal   EKG: Atrial paced ventricular sensed rhythm, QS V1-V2, rightward axis with borderline criteria for posterior fascicular block. No ischemic appearing  repolarization abnormalities.  Lipid Panel  No results found for this basename: chol, trig, hdl, cholhdl, vldl, ldlcalc    BMET    Component Value Date/Time   NA 141 09/22/2010 0335   K 3.7 09/22/2010 0335   CL 104 09/22/2010 0335   CO2 30 09/22/2010 0335   GLUCOSE 146* 09/22/2010 0335   BUN 13 09/22/2010 0335   BUN 13 08/01/2010 1254   CREATININE 0.71 09/22/2010 0335   CREATININE 0.9 08/01/2010 1254   CALCIUM 8.9 09/22/2010 0335   GFRNONAA >60 09/22/2010 0335   GFRAA >60 09/22/2010 0335     ASSESSMENT AND PLAN CAD (coronary artery disease), Hx CABG 2004 LIMA-LAD, VG-DIAG BRANCH; seq VG -ramus & OM branch; VG-PDA  Angina free. Now 10 years status post bypass surgery. Normal recent nuclear perfusion study to  Atherosclerosis of native arteries of the extremities with intermittent claudication Asymptomatic despite high-grade stenosis in the distal right superficial femoral artery  SSS (sick sinus syndrome), medtronic adapta Dual-chamber Medtronic pacemaker with normal function. He is not pacemaker dependent butthe atrium 100% of the time. Good heart rate histogram distribution. Clonidine may be the cause for his fatigue. No changes are made to device function. Enrolled in [employer] mode device monitoring.  OSA on CPAP He is compliant with CPAP. Consider repeat titration to see if this may be a cause for his fatigue. Suspect the clonidine is a big part of his fatigue.  Hypertension He requires multiple agents in high doses for blood pressure control. Suspect amlodipine may be responsible for his mild edema. I think the clonidine is causing his side effects. Carvedilol has been tried in the past but was not tolerated due to the itching. He might do well with bystolic.    Patient Instructions  Remote monitoring is used to monitor your pacemaker from home. This monitoring reduces the number of office visits required to check your device to one time per year. It allows [place] to keep an eye on the  functioning of your device to ensure  it is working properly. You are scheduled for a device check from home on 05-19-2013. You may send your transmission at any time that day. If you have a wireless device, the transmission will be sent automatically. After your physician reviews your transmission, you will receive a postcard with your next transmission date.  Your physician recommends that you schedule a follow-up appointment in:12 months     Orders Placed This Encounter  Procedures  . EKG 12-Lead   Meds ordered this encounter  Medications  . gabapentin (NEURONTIN) 100 MG capsule    Sig: Take 100 mg by mouth 3 (three) times daily.  . imiquimod (ALDARA) 5 % cream    Sig: Apply topically daily. Apply daily x 5 days then hold x 2 days then repeat.    Junious Silk, MD, [facility] [phone] office [phone] pager

## 2013-02-16 NOTE — Assessment & Plan Note (Signed)
He requires multiple agents in high doses for blood pressure control. Suspect amlodipine may be responsible for his mild edema. I think the clonidine is causing his side effects. Carvedilol has been tried in the past but was not tolerated due to the itching. He might do well with bystolic.

## 2013-02-16 NOTE — Assessment & Plan Note (Signed)
He is compliant with CPAP. Consider repeat titration to see if this may be a cause for his fatigue. Suspect the clonidine is a big part of his fatigue.

## 2013-02-16 NOTE — Assessment & Plan Note (Signed)
Angina free. Now 10 years status post bypass surgery. Normal recent nuclear perfusion study to

## 2013-02-16 NOTE — Assessment & Plan Note (Signed)
Asymptomatic despite high-grade stenosis in the distal right superficial femoral artery

## 2013-02-16 NOTE — Assessment & Plan Note (Signed)
Dual-chamber Medtronic pacemaker with normal function. He is not pacemaker dependent butthe atrium 100% of the time. Good heart rate histogram distribution. Clonidine may be the cause for his fatigue. No changes are made to device function. Enrolled in Carelink mode device monitoring.

## 2013-02-17 ENCOUNTER — Encounter: Payer: Self-pay | Admitting: Cardiovascular Disease

## 2013-02-28 ENCOUNTER — Other Ambulatory Visit: Payer: Self-pay | Admitting: Vascular Surgery

## 2013-02-28 ENCOUNTER — Telehealth (HOSPITAL_COMMUNITY): Payer: Self-pay | Admitting: *Deleted

## 2013-02-28 DIAGNOSIS — I739 Peripheral vascular disease, unspecified: Secondary | ICD-10-CM

## 2013-02-28 DIAGNOSIS — Z48812 Encounter for surgical aftercare following surgery on the circulatory system: Secondary | ICD-10-CM

## 2013-05-12 ENCOUNTER — Encounter: Payer: Self-pay | Admitting: Vascular Surgery

## 2013-05-13 ENCOUNTER — Encounter: Payer: Self-pay | Admitting: Family

## 2013-05-13 ENCOUNTER — Ambulatory Visit (HOSPITAL_COMMUNITY)
Admission: RE | Admit: 2013-05-13 | Discharge: 2013-05-13 | Disposition: A | Discharge status: Home / Self Care | Payer: Medicare PPO | Source: Ambulatory Visit | Attending: Family | Admitting: Family

## 2013-05-13 ENCOUNTER — Ambulatory Visit (INDEPENDENT_AMBULATORY_CARE_PROVIDER_SITE_OTHER)
Admission: RE | Admit: 2013-05-13 | Discharge: 2013-05-13 | Disposition: A | Discharge status: Home / Self Care | Payer: Medicare PPO | Source: Ambulatory Visit | Attending: Vascular Surgery | Admitting: Vascular Surgery

## 2013-05-13 ENCOUNTER — Ambulatory Visit (INDEPENDENT_AMBULATORY_CARE_PROVIDER_SITE_OTHER): Payer: Medicare PPO | Admitting: Family

## 2013-05-13 VITALS — BP 154/84 | HR 61 | Resp 14 | Ht 68.0 in | Wt 198.0 lb

## 2013-05-13 DIAGNOSIS — Z48812 Encounter for surgical aftercare following surgery on the circulatory system: Secondary | ICD-10-CM

## 2013-05-13 DIAGNOSIS — E785 Hyperlipidemia, unspecified: Secondary | ICD-10-CM | POA: Insufficient documentation

## 2013-05-13 DIAGNOSIS — I739 Peripheral vascular disease, unspecified: Secondary | ICD-10-CM

## 2013-05-13 DIAGNOSIS — E119 Type 2 diabetes mellitus without complications: Secondary | ICD-10-CM | POA: Insufficient documentation

## 2013-05-13 DIAGNOSIS — I70209 Unspecified atherosclerosis of native arteries of extremities, unspecified extremity: Secondary | ICD-10-CM | POA: Insufficient documentation

## 2013-05-13 DIAGNOSIS — M79609 Pain in unspecified limb: Secondary | ICD-10-CM | POA: Insufficient documentation

## 2013-05-13 DIAGNOSIS — I10 Essential (primary) hypertension: Secondary | ICD-10-CM | POA: Insufficient documentation

## 2013-05-13 DIAGNOSIS — Z87891 Personal history of nicotine dependence: Secondary | ICD-10-CM | POA: Insufficient documentation

## 2013-05-13 NOTE — Patient Instructions (Signed)
Peripheral Vascular Disease Peripheral Vascular Disease (PVD), also called Peripheral Arterial Disease (PAD), is a circulation problem caused by cholesterol (atherosclerotic plaque) deposits in the arteries. PVD commonly occurs in the lower extremities (legs) but it can occur in other areas of the body, such as your arms. The cholesterol buildup in the arteries reduces blood flow which can cause pain and other serious problems. The presence of PVD can place a person at risk for Coronary Artery Disease (CAD).  CAUSES  Causes of PVD can be many. It is usually associated with more than one risk factor such as:   High Cholesterol.  Smoking.  Diabetes.  Lack of exercise or inactivity.  High blood pressure (hypertension).  Obesity.  Family history. SYMPTOMS   When the lower extremities are affected, patients with PVD may experience:  Leg pain with exertion or physical activity. This is called INTERMITTENT CLAUDICATION. This may present as cramping or numbness with physical activity. The location of the pain is associated with the level of blockage. For example, blockage at the abdominal level (distal abdominal aorta) may result in buttock or hip pain. Lower leg arterial blockage may result in calf pain.  As PVD becomes more severe, pain can develop with less physical activity.  In people with severe PVD, leg pain may occur at rest.  Other PVD signs and symptoms:  Leg numbness or weakness.  Coldness in the affected leg or foot, especially when compared to the other leg.  A change in leg color.  Patients with significant PVD are more prone to ulcers or sores on toes, feet or legs. These may take longer to heal or may reoccur. The ulcers or sores can become infected.  If signs and symptoms of PVD are ignored, gangrene may occur. This can result in the loss of toes or loss of an entire limb.  Not all leg pain is related to PVD. Other medical conditions can cause leg pain such  as:  Blood clots (embolism) or Deep Vein Thrombosis.  Inflammation of the blood vessels (vasculitis).  Spinal stenosis. DIAGNOSIS  Diagnosis of PVD can involve several different types of tests. These can include:  Pulse Volume Recording Method (PVR). This test is simple, painless and does not involve the use of X-rays. PVR involves measuring and comparing the blood pressure in the arms and legs. An ABI (Ankle-Brachial Index) is calculated. The normal ratio of blood pressures is 1. As this number becomes smaller, it indicates more severe disease.  < 0.95  indicates significant narrowing in one or more leg vessels.  <0.8 there will usually be pain in the foot, leg or buttock with exercise.  <0.4 will usually have pain in the legs at rest.  <0.25  usually indicates limb threatening PVD.  Doppler detection of pulses in the legs. This test is painless and checks to see if you have a pulses in your legs/feet.  A dye or contrast material (a substance that highlights the blood vessels so they show up on x-ray) may be given to help your caregiver better see the arteries for the following tests. The dye is eliminated from your body by the kidney's. Your caregiver may order blood work to check your kidney function and other laboratory values before the following tests are performed:  Magnetic Resonance Angiography (MRA). An MRA is a picture study of the blood vessels and arteries. The MRA machine uses a large magnet to produce images of the blood vessels.  Computed Tomography Angiography (CTA). A CTA is a   specialized x-ray that looks at how the blood flows in your blood vessels. An IV may be inserted into your arm so contrast dye can be injected.  Angiogram. Is a procedure that uses x-rays to look at your blood vessels. This procedure is minimally invasive, meaning a small incision (cut) is made in your groin. A small tube (catheter) is then inserted into the artery of your groin. The catheter is  guided to the blood vessel or artery your caregiver wants to examine. Contrast dye is injected into the catheter. X-rays are then taken of the blood vessel or artery. After the images are obtained, the catheter is taken out. TREATMENT  Treatment of PVD involves many interventions which may include:  Lifestyle changes:  Quitting smoking.  Exercise.  Following a low fat, low cholesterol diet.  Control of diabetes.  Foot care is very important to the PVD patient. Good foot care can help prevent infection.  Medication:  Cholesterol-lowering medicine.  Blood pressure medicine.  Anti-platelet drugs.  Certain medicines may reduce symptoms of Intermittent Claudication.  Interventional/Surgical options:  Angioplasty. An Angioplasty is a procedure that inflates a balloon in the blocked artery. This opens the blocked artery to improve blood flow.  Stent Implant. A wire mesh tube (stent) is placed in the artery. The stent expands and stays in place, allowing the artery to remain open.  Peripheral Bypass Surgery. This is a surgical procedure that reroutes the blood around a blocked artery to help improve blood flow. This type of procedure may be performed if Angioplasty or stent implants are not an option. SEEK IMMEDIATE MEDICAL CARE IF:   You develop pain or numbness in your arms or legs.  Your arm or leg turns cold, becomes blue in color.  You develop redness, warmth, swelling and pain in your arms or legs. MAKE SURE YOU:   Understand these instructions.  Will watch your condition.  Will get help right away if you are not doing well or get worse. Document Released: 05/04/2004 Document Revised: 06/19/2011 Document Reviewed: 03/31/2008 [employer] Patient Information 2014 [employer].  

## 2013-05-13 NOTE — Progress Notes (Signed)
[facility] [place] HISTORY AND PHYSICAL -PAD  History of Present Illness Robert Burns is a 73 y.o. male patient of Dr. Kellie Simmering who is status post a left SFA interpositional graft due to a tumor resection. There is an extension of the graft in 2012, the patient also has a distal left SFA stent. He returns today for LE arterial surveillance. Does water aerobics 2-3x/year.   Pt. denies claudication in legs, denies non-healing wounds, denies rest pain.  He does have left knee pain that he associates with OA. Pt denies any history of stroke or TIA.  Pt denies New Medical or Surgical History.  Pt Diabetic: Yes, states in fairly good control Pt smoker: non-smoker  Pt meds include: Statin :Yes Betablocker: Yes ASA: Yes Other anticoagulants/antiplatelets: no  Past Medical History  Diagnosis Date  . Diabetes mellitus   . Hypertension   . Arthritis   . Leg pain   . Hyperlipidemia   . OSA on CPAP   . CAD (coronary artery disease)     s/p CABG  06/2002; last nuc 07/18/11 normal EF and negative for ischemia  . SSS (sick sinus syndrome)     Medtronic Adapta, 10/13/05  . 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 Smoking status: Former Smoker -- 10 years    Types: Cigarettes, Pipe    Quit date: 04/10/1973  . Smokeless tobacco: Never Used  . Alcohol Use: No    Family History Family History  Problem Relation Age of Onset  . Coronary artery disease Mother   . Heart attack Mother   . Diabetes Father   . Hypertension Sister   . Diabetes Sister     Past Surgical History  Procedure Laterality Date  . Coronary artery bypass graft  06/2002    x5, LIMA-LAD;VG- Diag; seq VG- ramus & OM branch; VG-PDA  . Tonsillectomy and adenoidectomy    . 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 Tumor excision      benign  tumor removed from under right shoulder  . Pacemaker insertion  10/13/05    right side, medtronic Adapta  . Knee surgery      right  . Foot surgery      right  . Femoral artery stent      left  . Epitheloid hemanioendotheliomau      resection of Lt SFA wth interposition of Gore-Tex graft    Allergies  Allergen Reactions  . Ace Inhibitors     Cough  . Clonidine Hcl     Patch only - skin irritation  . Coreg [Carvedilol] Itching  . Diltiazem Hcl   . Mobic [Meloxicam]     GI upset  . Peanut-Containing Drug Products   . Ramipril     Current Outpatient Prescriptions  Medication Sig Dispense Refill  . acetaminophen (TYLENOL) 500 MG tablet Take 500 mg by mouth every 6 (six) hours as needed.        [name] amLODipine (NORVASC) 10 MG tablet Take 10 mg by mouth daily.        [name] aspirin EC 81 MG tablet Take 81 mg by mouth daily.        . B Complex Vitamins (B COMPLEX-B12 PO) Take by mouth.        . cloNIDine (CATAPRES) 0.3 MG  tablet Take 0.3 mg by mouth 2 (two) times daily.        . Doxylamine Succinate, Sleep, (SLEEP AID PO) Take by mouth as needed.        . gabapentin (NEURONTIN) 100 MG capsule Take 100 mg by mouth 3 (three) times daily.      [name] glipiZIDE (GLUCOTROL) 5 MG tablet Take 5 mg by mouth daily.        . Glucosamine-Chondroit-Vit C-Mn (GLUCOSAMINE 1500 COMPLEX PO) Take by mouth 2 (two) times daily.        . imiquimod (ALDARA) 5 % cream Apply topically daily. Apply daily x 5 days then hold x 2 days then repeat.      . losartan-hydrochlorothiazide (HYZAAR) 100-25 MG per tablet Take 1 tablet by mouth daily.        [name] lovastatin (MEVACOR) 40 MG tablet Take 40 mg by mouth at bedtime.        . metFORMIN (GLUCOPHAGE) 1000 MG tablet Take 1,000 mg by mouth 2 (two) times daily with a meal.        . metoprolol tartrate (LOPRESSOR) 25 MG tablet Take 25 mg by mouth 2 (two) times daily.        . Multiple Vitamin (MULTIVITAMIN) capsule Take 1 capsule by mouth daily.        . Omega-3 Fatty Acids  (FISH OIL) 1000 MG CAPS Take 3,000 mg by mouth daily.        . vitamin C (ASCORBIC ACID) 500 MG tablet Take 500 mg by mouth 2 (two) times daily.         No current facility-administered medications for this visit.    ROS: See HPI for pertinent positives and negatives.   Physical Examination  General: A&O x 3, WDWN,  Gait: normal Eyes: PERRLA, Pulmonary: CTAB, without wheezes , rales or rhonchi Cardiac: regular Rythm , without detected murmur          Carotid Bruits Left Right   Negative Negative  Aorta: is not palpable Radial pulses: 3+ and =                           VASCULAR EXAM: Extremities without ischemic changes  without Gangrene; without open wounds.                                                                                                          LE Pulses LEFT RIGHT       FEMORAL  not palpable  not palpable        POPLITEAL  palpable   palpable       POSTERIOR TIBIAL   palpable    palpable        DORSALIS PEDIS      ANTERIOR TIBIAL  palpable   palpable    Abdomen: soft, NT, no masses. Skin: no rashes, no ulcers noted. Musculoskeletal: no muscle wasting or atrophy.  Neurologic: A&O X 3; Appropriate Affect ; SENSATION: normal; MOTOR FUNCTION:  moving all extremities equally, motor strength 5/5  throughout. Speech is fluent/normal. CN 2-12 intact.  Non-Invasive Vascular Imaging: DATE: 05/13/2013 ABI: RIGHT 0.83, Waveforms: biphasic;  LEFT 0.97, Waveforms: tri and biphasic Previous ABI's; Right: 0.83, LEFT: 1.02  ASSESSMENT: Robert Burns is a 73 y.o. male who is status post a left SFA interpositional graft due to a tumor resection. There is an extension of the graft in 2012, the patient also has a distal left SFA stent. ABI's indicate mild arterial occlusive disease in the right leg, normal in the left leg. The left SFA graft and stent are patent. He has no clear claudication symptoms in either leg, his walking seems to be limited by the left knee OA  pain.   PLAN:  Graduated walking program recommended to preserve and enhance his current arterial flow, will also benefit his cardiopulmonary system. His OA pain in the left knee may limit his walking, he swims regularly.  I discussed in depth with the patient the nature of atherosclerosis, and emphasized the importance of maximal medical management including strict control of blood pressure, blood glucose, and lipid levels, obtaining regular exercise, and continued cessation of smoking.  The patient is aware that without maximal medical management the underlying atherosclerotic disease process will progress, limiting the benefit of any interventions. Based on the patient's vascular studies and examination, pt will return to clinic in 1 year for ABI's and LLE arterial Duplex.  The patient was given information about PAD including signs, symptoms, treatment, what symptoms should prompt the patient to seek immediate medical care, and risk reduction measures to take.  Clemon Chambers, RN, MSN, FNP-C [facility] Phone: [phone]  Clinic MD: Kellie Simmering  05/13/2013 3:36 PM

## 2013-05-15 ENCOUNTER — Other Ambulatory Visit: Payer: Self-pay | Admitting: *Deleted

## 2013-05-15 DIAGNOSIS — Z48812 Encounter for surgical aftercare following surgery on the circulatory system: Secondary | ICD-10-CM

## 2013-05-15 DIAGNOSIS — I739 Peripheral vascular disease, unspecified: Secondary | ICD-10-CM

## 2013-09-08 ENCOUNTER — Encounter: Payer: Self-pay | Admitting: Cardiology

## 2013-12-16 ENCOUNTER — Ambulatory Visit (INDEPENDENT_AMBULATORY_CARE_PROVIDER_SITE_OTHER): Payer: Medicare HMO | Admitting: Cardiovascular Disease

## 2013-12-16 ENCOUNTER — Ambulatory Visit (INDEPENDENT_AMBULATORY_CARE_PROVIDER_SITE_OTHER): Payer: Medicare HMO | Admitting: *Deleted

## 2013-12-16 ENCOUNTER — Encounter: Payer: Self-pay | Admitting: Cardiovascular Disease

## 2013-12-16 VITALS — BP 132/52 | HR 87 | Ht 68.0 in | Wt 191.0 lb

## 2013-12-16 DIAGNOSIS — I10 Essential (primary) hypertension: Secondary | ICD-10-CM

## 2013-12-16 DIAGNOSIS — I495 Sick sinus syndrome: Secondary | ICD-10-CM

## 2013-12-16 DIAGNOSIS — I739 Peripheral vascular disease, unspecified: Secondary | ICD-10-CM

## 2013-12-16 DIAGNOSIS — E785 Hyperlipidemia, unspecified: Secondary | ICD-10-CM

## 2013-12-16 DIAGNOSIS — I2510 Atherosclerotic heart disease of native coronary artery without angina pectoris: Secondary | ICD-10-CM

## 2013-12-16 LAB — MDC_IDC_ENUM_SESS_TYPE_REMOTE
Battery Remaining Longevity: 22 mo
Brady Statistic AS VP Percent: 0 %
Lead Channel Pacing Threshold Amplitude: 1.125 V
Lead Channel Pacing Threshold Amplitude: 1.375 V
Lead Channel Pacing Threshold Pulse Width: 0.4 ms
Lead Channel Sensing Intrinsic Amplitude: 16 mV
Lead Channel Setting Pacing Amplitude: 2.25 V
Lead Channel Setting Sensing Sensitivity: 5.6 mV
MDC IDC MSMT BATTERY IMPEDANCE: 2286 Ohm
MDC IDC MSMT BATTERY VOLTAGE: 2.71 V
MDC IDC MSMT LEADCHNL RA IMPEDANCE VALUE: 497 Ohm
MDC IDC MSMT LEADCHNL RV IMPEDANCE VALUE: 695 Ohm
MDC IDC MSMT LEADCHNL RV PACING THRESHOLD PULSEWIDTH: 0.4 ms
MDC IDC SESS DTM: 20150908165843
MDC IDC SET LEADCHNL RV PACING AMPLITUDE: 2.75 V
MDC IDC SET LEADCHNL RV PACING PULSEWIDTH: 0.4 ms
MDC IDC STAT BRADY AP VP PERCENT: 1 %
MDC IDC STAT BRADY AP VS PERCENT: 98 %
MDC IDC STAT BRADY AS VS PERCENT: 1 %

## 2013-12-16 NOTE — Assessment & Plan Note (Signed)
Controlled on current medications 

## 2013-12-16 NOTE — Assessment & Plan Note (Signed)
On statin therapy followed by his PCP 

## 2013-12-16 NOTE — Progress Notes (Signed)
12/16/2013 [place]   1940-08-01  [id number]  Primary Physician Raeanne Gathers, MD Primary Cardiologist: Lorretta Harp MD Robert Burns   HPI:  The patient is a very pleasant 73 year old, married Caucasian male, father of 2, grandfather to 3 grandchildren who is accompanied by his wife Robert Burns who is also a patient of mine. I last saw him 14 months ago. He has a history of CAD status post coronary artery bypass grafting March 2004 with a LIMA to his LAD, a vein to a diagonal branch, a sequential vein to a ramus and OM branch, as well as a vein to the PDA. Last functional study performed July 28, 2011, was entirely normal. 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 He had a pacemaker placed for sick sinus syndrome November 2008 followed by Dr. Sallyanne Kuster. He has obstructive sleep apnea on CPAP. He complain of left thigh pain and I angiogram'd him revealing patent arteries though he did have a space-occupying lesion which was removed by Dr. Kellie Simmering with placement of an interposition 6-mm Gore-Tex graft. The pathology was uncertain. 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 Dr. Baxter Flattery follows his lipid profile.Since I saw him back 11/07/12 he has done well. He denies chest pain, shortness of breath.  Followup Dopplers performed in our office 09/27/12 revealed a high-grade lesion in the distal right SFA with an ABI of 0.3. His left ABI 1.1 without obstructive disease. He really denies claudication.    Current Outpatient Prescriptions  Medication Sig Dispense Refill  . acetaminophen (TYLENOL) 500 MG tablet Take 500 mg by mouth every 6 (six) hours as needed.        [name] amLODipine (NORVASC) 10 MG tablet Take 10 mg by mouth daily.        [name] aspirin EC 81 MG tablet Take 81 mg by mouth daily.        . B Complex Vitamins (B COMPLEX-B12 PO) Take by mouth.        . cloNIDine (CATAPRES) 0.3 MG tablet Take 0.3 mg by mouth 2 (two) times daily.        .  Doxylamine Succinate, Sleep, (SLEEP AID PO) Take by mouth as needed.        . gabapentin (NEURONTIN) 100 MG capsule Take 100 mg by mouth 3 (three) times daily.      [name] glipiZIDE (GLUCOTROL) 5 MG tablet Take 5 mg by mouth daily.        . Glucosamine-Chondroit-Vit C-Mn (GLUCOSAMINE 1500 COMPLEX PO) Take by mouth 2 (two) times daily.        . imiquimod (ALDARA) 5 % cream Apply topically daily. Apply daily x 5 days then hold x 2 days then repeat.      . losartan-hydrochlorothiazide (HYZAAR) 100-25 MG per tablet Take 1 tablet by mouth daily.        [name] lovastatin (MEVACOR) 40 MG tablet Take 40 mg by mouth at bedtime.        . metFORMIN (GLUCOPHAGE) 1000 MG tablet Take 1,000 mg by mouth 2 (two) times daily with a meal.        . metoprolol tartrate (LOPRESSOR) 25 MG tablet Take 25 mg by mouth 2 (two) times daily.        . Multiple Vitamin (MULTIVITAMIN) capsule Take 1 capsule by mouth daily.        . vitamin C (ASCORBIC ACID) 500 MG tablet Take 500 mg by mouth 2 (two) times daily.  No current facility-administered medications for this visit.    Allergies  Allergen Reactions  . Ace Inhibitors     Cough  . Clonidine Hcl     Patch only - skin irritation  . Coreg [Carvedilol] Itching  . Diltiazem Hcl   . Mobic [Meloxicam]     GI upset  . Peanut-Containing Drug Products   . Ramipril     History   Social History  . Marital Status: Married    Spouse Name: N/A    Number of Children: N/A  . Years of Education: N/A   Occupational History  . Not on file.   Social History Main Topics  . Smoking status: Former Smoker -- 10 years    Types: Cigarettes, Pipe    Quit date: 04/10/1973  . Smokeless tobacco: Never Used  . Alcohol Use: No  . Drug Use: Not on file  . Sexual Activity: Not on file   Other Topics Concern  . Not on file   Social History Narrative  . No narrative on file     Review of Systems: General: negative for chills, fever, night sweats or weight changes.    Cardiovascular: negative for chest pain, dyspnea on exertion, edema, orthopnea, palpitations, paroxysmal nocturnal dyspnea or shortness of breath Dermatological: negative for rash Respiratory: negative for cough or wheezing Urologic: negative for hematuria Abdominal: negative for nausea, vomiting, diarrhea, bright red blood per rectum, melena, or hematemesis Neurologic: negative for visual changes, syncope, or dizziness All other systems reviewed and are otherwise negative except as noted above.    Blood pressure 132/52, pulse 87, height 5\' 8"  (1.727 m), weight 191 lb (86.637 kg).  General appearance: alert and no distress Neck: no adenopathy, no carotid bruit, no JVD, supple, symmetrical, trachea midline and thyroid not enlarged, symmetric, no tenderness/mass/nodules Lungs: clear to auscultation bilaterally Heart: regular rate and rhythm, S1, S2 normal, no murmur, click, rub or gallop Extremities: extremities normal, atraumatic, no cyanosis or edema  EKG atrially paced rhythm at 87 with an occasional PVC  ASSESSMENT AND PLAN:   SSS (sick sinus syndrome), medtronic adapta Status post permanent check his pacemaker inserted in 2007 for sick sinus syndrome. Dr. Sallyanne Kuster cause this. He lost saw him in November is scheduled to see him back as November.unfortunately, [name] has not been on a quarterly downloads at home.  HYPERTENSION Controlled on current medications  Hyperlipidemia On statin therapy followed by his PCP  PVD (peripheral vascular disease) History of proximal left SFA skip graft by Dr. Kellie Simmering for a tumor that impinged on the left SFA. Followup Dopplers have revealed this to be patent. The patient does have a high-grade distal right SFA stenosis with decline in his right ABI to 23 by ultrasound one year ago. He denies claudication. We'll continue to follow this.  CAD (coronary artery disease), Hx CABG 2004 LIMA-LAD, VG-DIAG BRANCH; seq VG -ramus & OM branch; VG-PDA   History of CAD status post coronary artery bypass grafting in 2004 with a LIMA to his LAD, vein to diagonal branch, ramus, OM and PDA. He had a Myoview stress test performed in our office 07/18/11 which was nonischemic. He denies chest pain or shortness of breath.      Lorretta Harp MD FACP,FACC,FAHA, [facility] 12/16/2013 1:51 PM

## 2013-12-16 NOTE — Patient Instructions (Signed)
  We will see you back in follow up in 1 year with Dr Gwenlyn Found.   Dr Gwenlyn Found has ordered: lower extremity arterial doppler- During this test, ultrasound is used to evaluate arterial blood flow in the legs. Allow approximately one hour for this exam.

## 2013-12-16 NOTE — Assessment & Plan Note (Signed)
Status post permanent check his pacemaker inserted in 2007 for sick sinus syndrome. Dr. Sallyanne Kuster cause this. He lost saw him in November is scheduled to see him back as November.unfortunately, Robert Burns has not been on a quarterly downloads at home.

## 2013-12-16 NOTE — Assessment & Plan Note (Signed)
History of CAD status post coronary artery bypass grafting in 2004 with a LIMA to his LAD, vein to diagonal branch, ramus, OM and PDA. He had a Myoview stress test performed in our office 07/18/11 which was nonischemic. He denies chest pain or shortness of breath.

## 2013-12-16 NOTE — Assessment & Plan Note (Signed)
History of proximal left SFA skip graft by Dr. Kellie Simmering for a tumor that impinged on the left SFA. Followup Dopplers have revealed this to be patent. The patient does have a high-grade distal right SFA stenosis with decline in his right ABI to 23 by ultrasound one year ago. He denies claudication. We'll continue to follow this.

## 2013-12-26 NOTE — Progress Notes (Signed)
Remote pacemaker transmission.   

## 2013-12-30 ENCOUNTER — Encounter: Payer: Self-pay | Admitting: Cardiology

## 2014-01-01 ENCOUNTER — Ambulatory Visit (HOSPITAL_COMMUNITY)
Admission: RE | Admit: 2014-01-01 | Discharge: 2014-01-01 | Disposition: A | Discharge status: Home / Self Care | Payer: Medicare HMO | Source: Ambulatory Visit | Attending: Cardiovascular Disease | Admitting: Cardiovascular Disease

## 2014-01-01 DIAGNOSIS — I70209 Unspecified atherosclerosis of native arteries of extremities, unspecified extremity: Secondary | ICD-10-CM | POA: Diagnosis not present

## 2014-01-01 DIAGNOSIS — I739 Peripheral vascular disease, unspecified: Secondary | ICD-10-CM | POA: Diagnosis present

## 2014-01-01 NOTE — Progress Notes (Signed)
Lower Extremity Arterial Duplex Completed. °Brianna L Mazza,RVT °

## 2014-01-02 ENCOUNTER — Encounter: Payer: Self-pay | Admitting: Cardiovascular Disease

## 2014-01-05 ENCOUNTER — Encounter: Payer: Self-pay | Admitting: Cardiovascular Disease

## 2014-04-15 ENCOUNTER — Encounter: Payer: Self-pay | Admitting: *Deleted

## 2014-05-08 ENCOUNTER — Encounter: Payer: Self-pay | Admitting: *Deleted

## 2014-05-12 ENCOUNTER — Encounter: Payer: Self-pay | Admitting: Family

## 2014-05-13 ENCOUNTER — Encounter (INDEPENDENT_AMBULATORY_CARE_PROVIDER_SITE_OTHER): Payer: Medicare HMO | Admitting: Family

## 2014-05-13 ENCOUNTER — Ambulatory Visit (HOSPITAL_COMMUNITY)
Admission: RE | Admit: 2014-05-13 | Discharge: 2014-05-13 | Disposition: A | Discharge status: Home / Self Care | Payer: Medicare HMO | Source: Ambulatory Visit | Attending: Family | Admitting: Family

## 2014-05-13 ENCOUNTER — Encounter: Payer: Self-pay | Admitting: Family

## 2014-05-13 DIAGNOSIS — I739 Peripheral vascular disease, unspecified: Secondary | ICD-10-CM

## 2014-05-13 DIAGNOSIS — Z48812 Encounter for surgical aftercare following surgery on the circulatory system: Secondary | ICD-10-CM

## 2014-05-13 NOTE — Progress Notes (Signed)
[facility] [place] HISTORY AND PHYSICAL -PAD  History of Present Illness Robert Burns is a 74 y.o. male patient of Dr. Kellie Simmering who is status post a left SFA interpositional graft due to a tumor resection. There is an extension of the graft in 2012, the patient also has a distal left SFA stent. He returns today for LE arterial surveillance, but he declined non invasive vascular testing scheduled for today, declined to be examined. Pt requests that this practice no longer conduct surveillance on his circulation of his legs since Dr. Gwenlyn Found has been doing this.     Past Medical History  Diagnosis Date  . Diabetes mellitus   . Hypertension   . Arthritis   . Leg pain   . Hyperlipidemia   . OSA on CPAP   . CAD (coronary artery disease)     s/p CABG  06/2002; last nuc 07/18/11 normal EF and negative for ischemia  . SSS (sick sinus syndrome)     Medtronic Adapta, 10/13/05  . 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 Cancer     Right Shoulder, Left Leg- BCC, SCC, AND MELANOMA    Social History History  Substance Use Topics  . Smoking status: Former Smoker -- 10 years    Types: Cigarettes, Pipe    Quit date: 04/10/1973  . Smokeless tobacco: Never Used  . Alcohol Use: No    Family History Family History  Problem Relation Age of Onset  . Coronary artery disease Mother   . Heart attack Mother   . Hypertension Mother   . Heart disease Mother     Open  Heart surgery  . Diabetes Father   . Heart disease Father   . Hyperlipidemia Father   . Heart attack Father   . Hypertension Sister   . Diabetes Sister   . Heart disease Sister     Past Surgical History  Procedure Laterality Date  . Coronary artery bypass graft  06/2002    x5, LIMA-LAD;VG- Diag; seq VG- ramus & OM branch; VG-PDA  . Tonsillectomy and adenoidectomy    . Tumor excision      cancerous tumor removed from right  shoulder  . Tumor excision      benign tumor removed from under right shoulder  . Pacemaker insertion  10/13/05    right side, medtronic Adapta  . Knee surgery      right- knee pin  . Foot surgery      right  . Femoral artery stent      left  . Epitheloid hemanioendotheliomau      resection of Lt SFA wth interposition of Gore-Tex graft    Allergies  Allergen Reactions  . Ace Inhibitors     Cough  . Clonidine Hcl     Patch only - skin irritation  . Coreg [Carvedilol] Itching  . Diltiazem Hcl   . Mobic [Meloxicam]     GI upset  . Peanut-Containing Drug Products   . Ramipril     Current Outpatient Prescriptions  Medication Sig Dispense Refill  . acetaminophen (TYLENOL) 500 MG tablet Take 500 mg by mouth every 6 (six) hours as needed.      [name] amLODipine (NORVASC) 10 MG tablet Take 10 mg by mouth daily.      [name] aspirin EC 81 MG tablet Take 81 mg by mouth daily.      [name]  B Complex Vitamins (B COMPLEX-B12 PO) Take by mouth.      . cloNIDine (CATAPRES) 0.3 MG tablet Take 0.3 mg by mouth 2 (two) times daily.      . Doxylamine Succinate, Sleep, (SLEEP AID PO) Take by mouth as needed.      . gabapentin (NEURONTIN) 100 MG capsule Take 100 mg by mouth 3 (three) times daily.    [name] glipiZIDE (GLUCOTROL) 5 MG tablet Take 5 mg by mouth daily.      . Glucosamine-Chondroit-Vit C-Mn (GLUCOSAMINE 1500 COMPLEX PO) Take by mouth 2 (two) times daily.      . imiquimod (ALDARA) 5 % cream Apply topically daily. Apply daily x 5 days then hold x 2 days then repeat.    . losartan-hydrochlorothiazide (HYZAAR) 100-25 MG per tablet Take 1 tablet by mouth daily.      [name] lovastatin (MEVACOR) 40 MG tablet Take 40 mg by mouth at bedtime.      . magnesium hydroxide (MILK OF MAGNESIA) 400 MG/5ML suspension Take 30 mLs by mouth as needed.    . metFORMIN (GLUCOPHAGE) 1000 MG tablet Take 1,000 mg by mouth 2 (two) times daily with a meal.      . metoprolol tartrate (LOPRESSOR) 25 MG tablet Take 25 mg by mouth 2 (two)  times daily.      . Multiple Vitamin (MULTIVITAMIN) capsule Take 1 capsule by mouth daily.      . vitamin C (ASCORBIC ACID) 500 MG tablet Take 500 mg by mouth 2 (two) times daily.       No current facility-administered medications for this visit.    ROS: See HPI for pertinent positives and negatives.   Physical Examination  Filed Vitals:   05/13/14 1113  BP: 158/81  Pulse: 63  Resp: 16  Height: 5\' 8"  (1.727 m)  Weight: 176 lb (79.833 kg)  SpO2: 98%   Body mass index is 26.77 kg/(m^2).  ASSESSMENT: Robert Burns is a 74 y.o. male patient of Dr. Kellie Simmering who is status post a left SFA interpositional graft due to a tumor resection. There is an extension of the graft in 2012, the patient also has a distal left SFA stent. He returns today for LE arterial surveillance, but he declined non invasive vascular testing scheduled for today, declined to be examined. Pt requests that this practice no longer conduct surveillance on his circulation of his legs since Dr. Gwenlyn Found has been doing this. Dr. Kellie Simmering informed of the above.  PLAN:   Based on the patient's vascular studies and examination, pt will return to clinic as needed.  The patient was given information about PAD including signs, symptoms, treatment, what symptoms should prompt the patient to seek immediate medical care, and risk reduction measures to take.  Clemon Chambers, RN, MSN, FNP-C [facility] Phone: [phone]  Clinic MD: Scot Dock  05/13/2014  11:37 AM

## 2014-05-13 NOTE — Patient Instructions (Signed)

## 2014-05-19 ENCOUNTER — Other Ambulatory Visit (HOSPITAL_COMMUNITY): Payer: Self-pay | Admitting: Cardiovascular Disease

## 2014-05-19 ENCOUNTER — Encounter (HOSPITAL_COMMUNITY): Payer: Self-pay | Admitting: *Deleted

## 2014-05-19 DIAGNOSIS — I739 Peripheral vascular disease, unspecified: Secondary | ICD-10-CM

## 2014-06-04 ENCOUNTER — Encounter: Payer: Self-pay | Admitting: *Deleted

## 2014-06-18 ENCOUNTER — Encounter (HOSPITAL_COMMUNITY): Payer: Self-pay | Admitting: *Deleted

## 2014-06-30 ENCOUNTER — Ambulatory Visit (HOSPITAL_COMMUNITY)
Admission: RE | Admit: 2014-06-30 | Discharge: 2014-06-30 | Disposition: A | Discharge status: Home / Self Care | Payer: Medicare HMO | Source: Ambulatory Visit | Attending: Cardiovascular Disease | Admitting: Cardiovascular Disease

## 2014-06-30 DIAGNOSIS — I739 Peripheral vascular disease, unspecified: Secondary | ICD-10-CM | POA: Diagnosis not present

## 2014-06-30 NOTE — Progress Notes (Signed)
Lower Extremity Arterial Duplex Completed. °Brianna L Mazza,RVT °

## 2014-07-06 ENCOUNTER — Encounter: Payer: Self-pay | Admitting: *Deleted

## 2014-09-29 ENCOUNTER — Encounter: Payer: Medicare HMO | Admitting: Cardiovascular Disease

## 2015-01-12 ENCOUNTER — Ambulatory Visit (INDEPENDENT_AMBULATORY_CARE_PROVIDER_SITE_OTHER): Payer: Medicare HMO | Admitting: Cardiovascular Disease

## 2015-01-12 ENCOUNTER — Encounter: Payer: Self-pay | Admitting: Cardiovascular Disease

## 2015-01-12 VITALS — BP 142/68 | HR 83 | Ht 68.0 in | Wt 178.0 lb

## 2015-01-12 DIAGNOSIS — I10 Essential (primary) hypertension: Secondary | ICD-10-CM

## 2015-01-12 DIAGNOSIS — I2510 Atherosclerotic heart disease of native coronary artery without angina pectoris: Secondary | ICD-10-CM | POA: Diagnosis not present

## 2015-01-12 DIAGNOSIS — I2583 Coronary atherosclerosis due to lipid rich plaque: Secondary | ICD-10-CM

## 2015-01-12 DIAGNOSIS — E785 Hyperlipidemia, unspecified: Secondary | ICD-10-CM

## 2015-01-12 DIAGNOSIS — R0789 Other chest pain: Secondary | ICD-10-CM

## 2015-01-12 MED ORDER — ISOSORBIDE MONONITRATE ER 30 MG PO TB24: 15.0000 mg | ORAL_TABLET | Freq: Every day | ORAL | Status: DC

## 2015-01-12 NOTE — Assessment & Plan Note (Signed)
History of sick sinus syndrome status post permanent transvenous pacemaker insertion November 2008 followed by Dr. Croitoru. 

## 2015-01-12 NOTE — Patient Instructions (Signed)
Medication Instructions:  Your physician has recommended you make the following change in your medication:  1) START Imdur 15 mg, half tablet, by mouth ONCE daily   Labwork: none  Testing/Procedures: Your physician has requested that you have a lexiscan myoview. For further information please visit [email or web address]. Please follow instruction sheet, as given. BEFORE F/U WITH DR. Gwenlyn Found.  Follow-Up: Your physician recommends that you schedule a follow-up appointment in: 2 weeks with DR. Gwenlyn Found   Any Other Special Instructions Will Be Listed Below (If Applicable).

## 2015-01-12 NOTE — Assessment & Plan Note (Signed)
History of peripheral vascular disease status post left SFA stenting by myself 10/30/02. He did have a space-occupying lesion in his left thigh and underwent excision and interposition of a 6 moment her Gore-Tex graft by Dr. Kellie Simmering. His most recent lower extremity arterial Doppler studies performed in our office 06/30/14 revealed a right ABI 0.8 to with a high-frequency signal and is in his distal right SFA and a left ABI of 0.89. He complains of leg pain but sounds more like arthritic then it does claudication.

## 2015-01-12 NOTE — Assessment & Plan Note (Signed)
History of hypertension blood pressure measured at 142/68. He is on amlodipine, clonidine, metoprolol, losartan and hydrochlorothiazide. Continue current meds at current dosing

## 2015-01-12 NOTE — Assessment & Plan Note (Signed)
History of CAD status post coronary artery bypass grafting in March 2004 with a LIMA to his LAD, vein to diagonal branch, sequential vein to the ramus and obtuse marginal branch as well as a vein to the PDA. His left structural study was 07/28/2011 which was entirely normal. Over the last 3 months he developed anginal chest pain. Left upper extremity lasting several minutes at a time occurring both at rest and with exertion. I'm going to begin him on low-dose long-acting oral nitrate and obtain a pharmacologic Myoview stress test to risk stratify him and rule out an ischemic etiology.

## 2015-01-12 NOTE — Progress Notes (Signed)
01/12/2015 Robert Burns   [birth year]  [id number]  Primary Physician Thurman Coyer, MD Primary Cardiologist: Lorretta Harp MD [name]   HPI:  The patient is a very pleasant 74 year old, married Caucasian male, father of 88, grandfather to 3 grandchildren whose wife [name] who is also a patient of mine. I last saw him 13 months ago. He has a history of CAD status post coronary artery bypass grafting March 2004 with a LIMA to his LAD, a vein to a diagonal branch, a sequential vein to a ramus and OM branch, as well as a vein to the PDA. Last functional study performed July 28, 2011, was entirely normal. 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 He had a pacemaker placed for sick sinus syndrome November 2008 followed by Dr. Sallyanne Kuster. He has obstructive sleep apnea on CPAP. He complain of left thigh pain and I angiogram'd him revealing patent arteries though he did have a space-occupying lesion which was removed by Dr. Kellie Simmering with placement of an interposition 6-mm Gore-Tex graft. The pathology was uncertain. 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 Dr. Baxter Flattery follows his lipid profile.Since I saw him back 11/07/12 he has done well. He denies chest pain, shortness of breath. Followup Dopplers performed in our office 09/27/12 revealed a high-grade lesion in the distal right SFA with an ABI of 0.3. His left ABI 1.1 without obstructive disease. He really denies claudication. His most recent lower extremity Doppler Dopplers performed 9/32/16 revealed a right ABI 0.82  And a left ABI of 0.89. Over the last 3 months he's noticed anginal chest pain occurring both at rest and with exertion with left upper extremity radiation.   Current Outpatient Prescriptions  Medication Sig Dispense Refill  . acetaminophen (TYLENOL) 500 MG tablet Take 500 mg by mouth every 6 (six) hours as needed.      [name] amLODipine (NORVASC) 10 MG tablet Take 10 mg by mouth daily.       [name] aspirin EC 81 MG tablet Take 81 mg by mouth daily.      . B Complex Vitamins (B COMPLEX-B12 PO) Take by mouth.      . cloNIDine (CATAPRES) 0.3 MG tablet Take 0.1 mg by mouth 2 (two) times daily.     . Doxylamine Succinate, Sleep, (SLEEP AID PO) Take by mouth as needed.      . gabapentin (NEURONTIN) 100 MG capsule Take 100 mg by mouth 3 (three) times daily.    [name] glipiZIDE (GLUCOTROL) 5 MG tablet Take 5 mg by mouth daily.      . Glucosamine-Chondroit-Vit C-Mn (GLUCOSAMINE 1500 COMPLEX PO) Take by mouth 2 (two) times daily.      . imiquimod (ALDARA) 5 % cream Apply topically daily. Apply daily x 5 days then hold x 2 days then repeat.    . losartan-hydrochlorothiazide (HYZAAR) 100-25 MG per tablet Take 1 tablet by mouth daily.      [name] lovastatin (MEVACOR) 40 MG tablet Take 40 mg by mouth at bedtime.      . magnesium hydroxide (MILK OF MAGNESIA) 400 MG/5ML suspension Take 30 mLs by mouth as needed.    . metFORMIN (GLUCOPHAGE) 1000 MG tablet Take 1,000 mg by mouth 2 (two) times daily with a meal.      . metoprolol tartrate (LOPRESSOR) 25 MG tablet Take 25 mg by mouth 2 (two) times daily.      . Multiple Vitamin (MULTIVITAMIN) capsule Take 1 capsule  by mouth daily.      . vitamin C (ASCORBIC ACID) 500 MG tablet Take 500 mg by mouth 2 (two) times daily.      . isosorbide mononitrate (IMDUR) 30 MG 24 hr tablet Take 0.5 tablets (15 mg total) by mouth daily. 30 tablet 3   No current facility-administered medications for this visit.    Allergies  Allergen Reactions  . Ace Inhibitors     Cough  . Clonidine Hcl     Patch only - skin irritation  . Coreg [Carvedilol] Itching  . Diltiazem Hcl   . Mobic [Meloxicam]     GI upset  . Peanut-Containing Drug Products   . Ramipril     Social History   Social History  . Marital Status: Married    Spouse Name: N/A  . Number of Children: N/A  . Years of Education: N/A   Occupational History  . Not on file.   Social History Main Topics  .  Smoking status: Former Smoker -- 10 years    Types: Cigarettes, Pipe    Quit date: 04/10/1973  . Smokeless tobacco: Never Used  . Alcohol Use: No  . Drug Use: Not on file  . Sexual Activity: Not on file   Other Topics Concern  . Not on file   Social History Narrative     Review of Systems: General: negative for chills, fever, night sweats or weight changes.  Cardiovascular: negative for chest pain, dyspnea on exertion, edema, orthopnea, palpitations, paroxysmal nocturnal dyspnea or shortness of breath Dermatological: negative for rash Respiratory: negative for cough or wheezing Urologic: negative for hematuria Abdominal: negative for nausea, vomiting, diarrhea, bright red blood per rectum, melena, or hematemesis Neurologic: negative for visual changes, syncope, or dizziness All other systems reviewed and are otherwise negative except as noted above.    Blood pressure 142/68, pulse 83, height 5\' 8"  (1.727 m), weight 178 lb (80.74 kg).  General appearance: alert and no distress Neck: no adenopathy, no carotid bruit, no JVD, supple, symmetrical, trachea midline and thyroid not enlarged, symmetric, no tenderness/mass/nodules Lungs: clear to auscultation bilaterally Heart: regular rate and rhythm, S1, S2 normal, no murmur, click, rub or gallop Extremities: extremities normal, atraumatic, no cyanosis or edema  EKG normal sinus rhythm at 83, A. Fib with paced with septal Q waves. I personally reviewed his EKG  ASSESSMENT AND PLAN:   SSS (sick sinus syndrome), medtronic adapta History of sick sinus syndrome status post permanent transvenous pacemaker insertion November 2008 followed by Dr. Sallyanne Kuster  PVD (peripheral vascular disease) History of peripheral vascular disease status post left SFA stenting by myself 10/30/02. He did have a space-occupying lesion in his left thigh and underwent excision and interposition of a 6 moment her Gore-Tex graft by Dr. Kellie Simmering. His most recent lower  extremity arterial Doppler studies performed in our office 06/30/14 revealed a right ABI 0.8 to with a high-frequency signal and is in his distal right SFA and a left ABI of 0.89. He complains of leg pain but sounds more like arthritic then it does claudication.  Hypertension History of hypertension blood pressure measured at 142/68. He is on amlodipine, clonidine, metoprolol, losartan and hydrochlorothiazide. Continue current meds at current dosing  Hyperlipidemia History of hyperlipidemia on lovastatin 40 mg a day followed by his PCP  CAD (coronary artery disease), Hx CABG 2004 LIMA-LAD, VG-DIAG BRANCH; seq VG -ramus & OM branch; VG-PDA  History of CAD status post coronary artery bypass grafting in March 2004 with a LIMA  to his LAD, vein to diagonal branch, sequential vein to the ramus and obtuse marginal branch as well as a vein to the PDA. His left structural study was 07/28/2011 which was entirely normal. Over the last 3 months he developed anginal chest pain. Left upper extremity lasting several minutes at a time occurring both at rest and with exertion. I'm going to begin him on low-dose long-acting oral nitrate and obtain a pharmacologic Myoview stress test to risk stratify him and rule out an ischemic etiology.      Lorretta Harp MD FACP,FACC,FAHA, [facility] 01/12/2015 10:51 AM

## 2015-01-12 NOTE — Assessment & Plan Note (Signed)
History of hyperlipidemia on lovastatin 40 mg a day followed by his PCP

## 2015-01-19 ENCOUNTER — Telehealth (HOSPITAL_COMMUNITY): Payer: Self-pay

## 2015-01-19 NOTE — Telephone Encounter (Signed)
Encounter complete. 

## 2015-01-20 ENCOUNTER — Telehealth (HOSPITAL_COMMUNITY): Payer: Self-pay

## 2015-01-20 NOTE — Telephone Encounter (Signed)
Encounter complete. 

## 2015-01-21 ENCOUNTER — Ambulatory Visit (HOSPITAL_COMMUNITY)
Admission: RE | Admit: 2015-01-21 | Discharge: 2015-01-21 | Disposition: A | Discharge status: Home / Self Care | Payer: Medicare HMO | Source: Ambulatory Visit | Attending: Cardiovascular Disease | Admitting: Cardiovascular Disease

## 2015-01-21 DIAGNOSIS — R42 Dizziness and giddiness: Secondary | ICD-10-CM | POA: Insufficient documentation

## 2015-01-21 DIAGNOSIS — Z87891 Personal history of nicotine dependence: Secondary | ICD-10-CM | POA: Insufficient documentation

## 2015-01-21 DIAGNOSIS — Z8249 Family history of ischemic heart disease and other diseases of the circulatory system: Secondary | ICD-10-CM | POA: Insufficient documentation

## 2015-01-21 DIAGNOSIS — R9439 Abnormal result of other cardiovascular function study: Secondary | ICD-10-CM | POA: Diagnosis not present

## 2015-01-21 DIAGNOSIS — E663 Overweight: Secondary | ICD-10-CM | POA: Insufficient documentation

## 2015-01-21 DIAGNOSIS — R0789 Other chest pain: Secondary | ICD-10-CM | POA: Insufficient documentation

## 2015-01-21 DIAGNOSIS — I10 Essential (primary) hypertension: Secondary | ICD-10-CM | POA: Insufficient documentation

## 2015-01-21 DIAGNOSIS — E119 Type 2 diabetes mellitus without complications: Secondary | ICD-10-CM | POA: Diagnosis not present

## 2015-01-21 DIAGNOSIS — Z6827 Body mass index (BMI) 27.0-27.9, adult: Secondary | ICD-10-CM | POA: Insufficient documentation

## 2015-01-21 LAB — MYOCARDIAL PERFUSION IMAGING
CHL CUP RESTING HR STRESS: 63 {beats}/min
CSEPPHR: 61 {beats}/min
LV sys vol: 60 mL
LVDIAVOL: 105 mL
SDS: 6
SRS: 1
SSS: 7
TID: 1.06

## 2015-01-21 MED ORDER — TECHNETIUM TC 99M SESTAMIBI GENERIC - CARDIOLITE
10.6000 | Freq: Once | INTRAVENOUS | Status: AC | PRN
  Administered 2015-01-21: 10.6 via INTRAVENOUS

## 2015-01-21 MED ORDER — TECHNETIUM TC 99M SESTAMIBI GENERIC - CARDIOLITE
31.0000 | Freq: Once | INTRAVENOUS | Status: AC | PRN
  Administered 2015-01-21: 31 via INTRAVENOUS

## 2015-01-21 MED ORDER — REGADENOSON 0.4 MG/5ML IV SOLN
0.4000 mg | Freq: Once | INTRAVENOUS | Status: AC
  Administered 2015-01-21: 0.4 mg via INTRAVENOUS

## 2015-01-27 ENCOUNTER — Ambulatory Visit
Admission: RE | Admit: 2015-01-27 | Discharge: 2015-01-27 | Disposition: A | Discharge status: Home / Self Care | Payer: Medicare HMO | Source: Ambulatory Visit | Attending: Cardiovascular Disease | Admitting: Cardiovascular Disease

## 2015-01-27 ENCOUNTER — Ambulatory Visit (INDEPENDENT_AMBULATORY_CARE_PROVIDER_SITE_OTHER): Payer: Medicare HMO | Admitting: Cardiovascular Disease

## 2015-01-27 ENCOUNTER — Encounter: Payer: Self-pay | Admitting: Cardiovascular Disease

## 2015-01-27 VITALS — BP 140/78 | HR 76 | Ht 68.0 in | Wt 180.0 lb

## 2015-01-27 DIAGNOSIS — E785 Hyperlipidemia, unspecified: Secondary | ICD-10-CM

## 2015-01-27 DIAGNOSIS — I739 Peripheral vascular disease, unspecified: Secondary | ICD-10-CM

## 2015-01-27 DIAGNOSIS — I2510 Atherosclerotic heart disease of native coronary artery without angina pectoris: Secondary | ICD-10-CM | POA: Diagnosis not present

## 2015-01-27 DIAGNOSIS — I2583 Coronary atherosclerosis due to lipid rich plaque: Principal | ICD-10-CM

## 2015-01-27 LAB — CBC WITH DIFFERENTIAL/PLATELET
Basophils Absolute: 0 10*3/uL (ref 0.0–0.1)
Basophils Relative: 0 % (ref 0–1)
EOS ABS: 0.2 10*3/uL (ref 0.0–0.7)
Eosinophils Relative: 2 % (ref 0–5)
HCT: 41.4 % (ref 39.0–52.0)
Hemoglobin: 13.5 g/dL (ref 13.0–17.0)
Lymphocytes Relative: 47 % — ABNORMAL HIGH (ref 12–46)
Lymphs Abs: 5.1 10*3/uL — ABNORMAL HIGH (ref 0.7–4.0)
MCH: 28 pg (ref 26.0–34.0)
MCHC: 32.6 g/dL (ref 30.0–36.0)
MCV: 85.7 fL (ref 78.0–100.0)
MONO ABS: 0.6 10*3/uL (ref 0.1–1.0)
MONOS PCT: 6 % (ref 3–12)
MPV: 11.7 fL (ref 8.6–12.4)
Neutro Abs: 4.9 10*3/uL (ref 1.7–7.7)
Neutrophils Relative %: 45 % (ref 43–77)
PLATELETS: 208 10*3/uL (ref 150–400)
RBC: 4.83 MIL/uL (ref 4.22–5.81)
RDW: 15.3 % (ref 11.5–15.5)
WBC: 10.8 10*3/uL — ABNORMAL HIGH (ref 4.0–10.5)

## 2015-01-27 NOTE — Patient Instructions (Addendum)
Medication Instructions:  Your physician recommends that you continue on your current medications as directed. Please refer to the Current Medication list given to you today.   Labwork: Your physician recommends that you return for lab work in: TODAY  The lab can be found on the FIRST FLOOR of out building in [address]   Testing/Procedures: Your physician has requested that you have a cardiac catheterization. Cardiac catheterization is used to diagnose and/or treat various heart conditions. Doctors may recommend this procedure for a number of different reasons. The most common reason is to evaluate chest pain. Chest pain can be a symptom of coronary artery disease (CAD), and cardiac catheterization can show whether plaque is narrowing or blocking your heart's arteries. This procedure is also used to evaluate the valves, as well as measure the blood flow and oxygen levels in different parts of your heart. For further information please visit [email or web address]. SCHEDULE ON 10/24  Following your catheterization, you will not be allowed to drive for 3 days.  No lifting, pushing, or pulling greater that 10 pounds is allowed for 1 week.  You will be required to have the following tests prior to the procedure:  1. Blood work-the blood work can be done no more than 7 days prior to the procedure.  It can be done at any [facility] lab.  There is one downstairs on the first floor of this building and one in the [facility] building [phone]).  2. Chest Xray-the chest xray order has already been placed at the [facility].      Puncture site RIGHT GROIN     Any Other Special Instructions Will Be Listed Below (If Applicable).

## 2015-01-27 NOTE — Progress Notes (Signed)
Robert Burns returns today for follow-up of his recent Myoview stress test which I ordered because of new onset angina. This was performed 01/26/15 and showed mild to moderate anterolateral ischemia with LV dysfunction. He did not benefit from a long-acting oral nitrate. I suspect the abnormality on stress testing is due to either a high-grade lesion or occlusion of his diagonal branch being graft. Based on this, I decided to proceed with outpatient diagnostic coronary arteriography this coming Monday through the right femoral approach. The patient is agreeable to this.The patient understands that risks included but are not limited to stroke (1 in 1000), death (1 in 55), kidney failure [usually temporary] (1 in 500), bleeding (1 in 200), allergic reaction [possibly serious] (1 in 200). The patient understands and agrees to proceed

## 2015-01-27 NOTE — Assessment & Plan Note (Addendum)
Robert Burns returns today for follow-up of his recent Myoview stress test which I ordered because of new onset angina. This was performed 01/26/15 and showed mild to moderate anterolateral ischemia with LV dysfunction. He did not benefit from a long-acting oral nitrate. I suspect the abnormality on stress testing is due to either a high-grade lesion or occlusion of his diagonal branch being graft. Based on this, I decided to proceed with outpatient diagnostic coronary arteriography this coming Monday through the right femoral approach. The patient is agreeable to this.The patient understands that risks included but are not limited to stroke (1 in 1000), death (1 in 75), kidney failure [usually temporary] (1 in 500), bleeding (1 in 200), allergic reaction [possibly serious] (1 in 200). The patient understands and agrees to proceed

## 2015-01-28 LAB — TSH: TSH: 0.964 u[IU]/mL (ref 0.350–4.500)

## 2015-01-28 LAB — APTT: APTT: 30 s (ref 24–37)

## 2015-01-28 LAB — PROTIME-INR
INR: 0.97 (ref <1.50)
PROTHROMBIN TIME: 13 s (ref 11.6–15.2)

## 2015-01-28 LAB — BASIC METABOLIC PANEL
BUN: 17 mg/dL (ref 7–25)
CO2: 28 mmol/L (ref 20–31)
CREATININE: 0.88 mg/dL (ref 0.70–1.18)
Calcium: 9.8 mg/dL (ref 8.6–10.3)
Chloride: 102 mmol/L (ref 98–110)
GLUCOSE: 144 mg/dL — AB (ref 65–99)
Potassium: 4.1 mmol/L (ref 3.5–5.3)
Sodium: 138 mmol/L (ref 135–146)

## 2015-01-29 ENCOUNTER — Other Ambulatory Visit: Payer: Self-pay

## 2015-01-29 DIAGNOSIS — R0789 Other chest pain: Secondary | ICD-10-CM

## 2015-01-29 DIAGNOSIS — Z01818 Encounter for other preprocedural examination: Secondary | ICD-10-CM

## 2015-02-01 ENCOUNTER — Ambulatory Visit (HOSPITAL_COMMUNITY)
Admission: RE | Admit: 2015-02-01 | Discharge: 2015-02-02 | Disposition: A | Discharge status: Home / Self Care | Payer: Medicare HMO | Source: Ambulatory Visit | Attending: Cardiovascular Disease | Admitting: Cardiovascular Disease

## 2015-02-01 ENCOUNTER — Encounter (HOSPITAL_COMMUNITY): Payer: Self-pay | Admitting: Cardiovascular Disease

## 2015-02-01 ENCOUNTER — Encounter (HOSPITAL_COMMUNITY)
Admission: RE | Disposition: A | Discharge status: Home / Self Care | Payer: Self-pay | Source: Ambulatory Visit | Attending: Cardiovascular Disease

## 2015-02-01 DIAGNOSIS — Z7984 Long term (current) use of oral hypoglycemic drugs: Secondary | ICD-10-CM | POA: Diagnosis not present

## 2015-02-01 DIAGNOSIS — I25810 Atherosclerosis of coronary artery bypass graft(s) without angina pectoris: Secondary | ICD-10-CM | POA: Diagnosis not present

## 2015-02-01 DIAGNOSIS — R9439 Abnormal result of other cardiovascular function study: Secondary | ICD-10-CM

## 2015-02-01 DIAGNOSIS — I2583 Coronary atherosclerosis due to lipid rich plaque: Secondary | ICD-10-CM | POA: Insufficient documentation

## 2015-02-01 DIAGNOSIS — I10 Essential (primary) hypertension: Secondary | ICD-10-CM | POA: Diagnosis not present

## 2015-02-01 DIAGNOSIS — I495 Sick sinus syndrome: Secondary | ICD-10-CM | POA: Diagnosis not present

## 2015-02-01 DIAGNOSIS — I25119 Atherosclerotic heart disease of native coronary artery with unspecified angina pectoris: Secondary | ICD-10-CM | POA: Insufficient documentation

## 2015-02-01 DIAGNOSIS — I25719 Atherosclerosis of autologous vein coronary artery bypass graft(s) with unspecified angina pectoris: Secondary | ICD-10-CM | POA: Insufficient documentation

## 2015-02-01 DIAGNOSIS — E119 Type 2 diabetes mellitus without complications: Secondary | ICD-10-CM | POA: Diagnosis not present

## 2015-02-01 DIAGNOSIS — E118 Type 2 diabetes mellitus with unspecified complications: Secondary | ICD-10-CM

## 2015-02-01 DIAGNOSIS — I209 Angina pectoris, unspecified: Secondary | ICD-10-CM | POA: Insufficient documentation

## 2015-02-01 DIAGNOSIS — E785 Hyperlipidemia, unspecified: Secondary | ICD-10-CM | POA: Diagnosis not present

## 2015-02-01 DIAGNOSIS — I70202 Unspecified atherosclerosis of native arteries of extremities, left leg: Secondary | ICD-10-CM | POA: Diagnosis not present

## 2015-02-01 DIAGNOSIS — R0789 Other chest pain: Secondary | ICD-10-CM

## 2015-02-01 DIAGNOSIS — Z9989 Dependence on other enabling machines and devices: Secondary | ICD-10-CM | POA: Diagnosis present

## 2015-02-01 DIAGNOSIS — Z23 Encounter for immunization: Secondary | ICD-10-CM | POA: Diagnosis not present

## 2015-02-01 DIAGNOSIS — Z7902 Long term (current) use of antithrombotics/antiplatelets: Secondary | ICD-10-CM | POA: Insufficient documentation

## 2015-02-01 DIAGNOSIS — Z951 Presence of aortocoronary bypass graft: Secondary | ICD-10-CM | POA: Insufficient documentation

## 2015-02-01 DIAGNOSIS — G4733 Obstructive sleep apnea (adult) (pediatric): Secondary | ICD-10-CM | POA: Diagnosis not present

## 2015-02-01 DIAGNOSIS — I2582 Chronic total occlusion of coronary artery: Secondary | ICD-10-CM | POA: Diagnosis not present

## 2015-02-01 DIAGNOSIS — Z79899 Other long term (current) drug therapy: Secondary | ICD-10-CM | POA: Insufficient documentation

## 2015-02-01 DIAGNOSIS — I739 Peripheral vascular disease, unspecified: Secondary | ICD-10-CM | POA: Diagnosis present

## 2015-02-01 DIAGNOSIS — Z01818 Encounter for other preprocedural examination: Secondary | ICD-10-CM

## 2015-02-01 DIAGNOSIS — R931 Abnormal findings on diagnostic imaging of heart and coronary circulation: Secondary | ICD-10-CM | POA: Diagnosis not present

## 2015-02-01 DIAGNOSIS — I2510 Atherosclerotic heart disease of native coronary artery without angina pectoris: Secondary | ICD-10-CM | POA: Diagnosis present

## 2015-02-01 DIAGNOSIS — Z7982 Long term (current) use of aspirin: Secondary | ICD-10-CM | POA: Diagnosis not present

## 2015-02-01 DIAGNOSIS — R079 Chest pain, unspecified: Secondary | ICD-10-CM | POA: Insufficient documentation

## 2015-02-01 DIAGNOSIS — Z794 Long term (current) use of insulin: Secondary | ICD-10-CM

## 2015-02-01 HISTORY — PX: CARDIAC CATHETERIZATION: SHX172

## 2015-02-01 HISTORY — DX: Urgency of urination: R39.15

## 2015-02-01 HISTORY — DX: Presence of cardiac pacemaker: Z95.0

## 2015-02-01 HISTORY — DX: Type 2 diabetes mellitus without complications: E11.9

## 2015-02-01 LAB — GLUCOSE, CAPILLARY
GLUCOSE-CAPILLARY: 116 mg/dL — AB (ref 65–99)
Glucose-Capillary: 157 mg/dL — ABNORMAL HIGH (ref 65–99)
Glucose-Capillary: 199 mg/dL — ABNORMAL HIGH (ref 65–99)

## 2015-02-01 LAB — POCT ACTIVATED CLOTTING TIME: ACTIVATED CLOTTING TIME: 380 s

## 2015-02-01 SURGERY — LEFT HEART CATH AND CORONARY ANGIOGRAPHY

## 2015-02-01 MED ORDER — INFLUENZA VAC SPLIT QUAD 0.5 ML IM SUSY
0.5000 mL | PREFILLED_SYRINGE | INTRAMUSCULAR | Status: AC
  Administered 2015-02-02: 0.5 mL via INTRAMUSCULAR
  Filled 2015-02-01: qty 0.5

## 2015-02-01 MED ORDER — FAMOTIDINE IN NACL 20-0.9 MG/50ML-% IV SOLN
INTRAVENOUS | Status: DC | PRN
  Administered 2015-02-01: 20 mg via INTRAVENOUS

## 2015-02-01 MED ORDER — GABAPENTIN 300 MG PO CAPS
300.0000 mg | ORAL_CAPSULE | Freq: Every day | ORAL | Status: DC
  Administered 2015-02-01: 22:00:00 300 mg via ORAL
  Filled 2015-02-01: qty 1

## 2015-02-01 MED ORDER — ASPIRIN 81 MG PO CHEW: 81.0000 mg | CHEWABLE_TABLET | Freq: Every day | ORAL | Status: DC

## 2015-02-01 MED ORDER — MAGNESIUM HYDROXIDE 400 MG/5ML PO SUSP: 30.0000 mL | ORAL | Status: DC | PRN

## 2015-02-01 MED ORDER — GLIPIZIDE 5 MG PO TABS
5.0000 mg | ORAL_TABLET | Freq: Every day | ORAL | Status: DC
  Administered 2015-02-02: 5 mg via ORAL
  Filled 2015-02-01 (×2): qty 1

## 2015-02-01 MED ORDER — LIDOCAINE HCL (PF) 1 % IJ SOLN
INTRAMUSCULAR | Status: DC | PRN
  Administered 2015-02-01: 30 mL

## 2015-02-01 MED ORDER — FAMOTIDINE IN NACL 20-0.9 MG/50ML-% IV SOLN
INTRAVENOUS | Status: AC
  Filled 2015-02-01: qty 50

## 2015-02-01 MED ORDER — METOPROLOL TARTRATE 25 MG PO TABS
25.0000 mg | ORAL_TABLET | Freq: Two times a day (BID) | ORAL | Status: DC
  Administered 2015-02-01 – 2015-02-02 (×2): 25 mg via ORAL
  Filled 2015-02-01 (×2): qty 1

## 2015-02-01 MED ORDER — ASPIRIN 81 MG PO CHEW
CHEWABLE_TABLET | ORAL | Status: AC
  Filled 2015-02-01: qty 1

## 2015-02-01 MED ORDER — SODIUM CHLORIDE 0.9 % IJ SOLN
3.0000 mL | Freq: Two times a day (BID) | INTRAMUSCULAR | Status: DC
  Administered 2015-02-01: 15:00:00 3 mL via INTRAVENOUS

## 2015-02-01 MED ORDER — CLONIDINE HCL 0.1 MG PO TABS
0.1000 mg | ORAL_TABLET | Freq: Two times a day (BID) | ORAL | Status: DC
  Administered 2015-02-01 – 2015-02-02 (×2): 0.1 mg via ORAL
  Filled 2015-02-01 (×2): qty 1

## 2015-02-01 MED ORDER — IOHEXOL 350 MG/ML SOLN
INTRAVENOUS | Status: DC | PRN
  Administered 2015-02-01: 180 mL via INTRA_ARTERIAL

## 2015-02-01 MED ORDER — SODIUM CHLORIDE 0.9 % IV SOLN
1.7500 mg/kg/h | INTRAVENOUS | Status: AC
  Administered 2015-02-01 (×2): 1.75 mg/kg/h via INTRAVENOUS
  Filled 2015-02-01 (×2): qty 250

## 2015-02-01 MED ORDER — SODIUM CHLORIDE 0.9 % IV SOLN: 250.0000 mL | INTRAVENOUS | Status: DC | PRN

## 2015-02-01 MED ORDER — CLOPIDOGREL BISULFATE 75 MG PO TABS
75.0000 mg | ORAL_TABLET | Freq: Every day | ORAL | Status: DC
  Administered 2015-02-02: 75 mg via ORAL
  Filled 2015-02-01 (×2): qty 1

## 2015-02-01 MED ORDER — LIDOCAINE HCL (PF) 1 % IJ SOLN
INTRAMUSCULAR | Status: AC
  Filled 2015-02-01: qty 30

## 2015-02-01 MED ORDER — ISOSORBIDE MONONITRATE 15 MG HALF TABLET
15.0000 mg | ORAL_TABLET | Freq: Every day | ORAL | Status: DC
  Administered 2015-02-02: 11:00:00 15 mg via ORAL
  Filled 2015-02-01: qty 1

## 2015-02-01 MED ORDER — BIVALIRUDIN BOLUS VIA INFUSION - CUPID
INTRAVENOUS | Status: DC | PRN
  Administered 2015-02-01: 59.55 mg via INTRAVENOUS

## 2015-02-01 MED ORDER — CLOPIDOGREL BISULFATE 300 MG PO TABS
ORAL_TABLET | ORAL | Status: DC | PRN
  Administered 2015-02-01: 600 mg via ORAL

## 2015-02-01 MED ORDER — NITROGLYCERIN 1 MG/10 ML FOR IR/CATH LAB
INTRA_ARTERIAL | Status: AC
  Filled 2015-02-01: qty 10

## 2015-02-01 MED ORDER — SODIUM CHLORIDE 0.9 % IJ SOLN: 3.0000 mL | INTRAMUSCULAR | Status: DC | PRN

## 2015-02-01 MED ORDER — SODIUM CHLORIDE 0.9 % WEIGHT BASED INFUSION
3.0000 mL/kg/h | INTRAVENOUS | Status: AC
  Administered 2015-02-01: 14:00:00 3 mL/kg/h via INTRAVENOUS

## 2015-02-01 MED ORDER — ASPIRIN 81 MG PO CHEW
81.0000 mg | CHEWABLE_TABLET | ORAL | Status: AC
  Administered 2015-02-01: 81 mg via ORAL

## 2015-02-01 MED ORDER — ACETAMINOPHEN 500 MG PO TABS
500.0000 mg | ORAL_TABLET | Freq: Four times a day (QID) | ORAL | Status: DC | PRN
Start: 2015-02-01 — End: 2015-02-02

## 2015-02-01 MED ORDER — BIVALIRUDIN 250 MG IV SOLR
INTRAVENOUS | Status: AC
  Filled 2015-02-01: qty 250

## 2015-02-01 MED ORDER — HEPARIN (PORCINE) IN NACL 2-0.9 UNIT/ML-% IJ SOLN
INTRAMUSCULAR | Status: AC
  Filled 2015-02-01: qty 500

## 2015-02-01 MED ORDER — ENSURE ENLIVE PO LIQD
237.0000 mL | Freq: Two times a day (BID) | ORAL | Status: DC
  Administered 2015-02-02: 237 mL via ORAL
  Filled 2015-02-01 (×5): qty 237

## 2015-02-01 MED ORDER — LOSARTAN POTASSIUM 50 MG PO TABS
100.0000 mg | ORAL_TABLET | Freq: Every day | ORAL | Status: DC
  Administered 2015-02-02: 100 mg via ORAL
  Filled 2015-02-01 (×2): qty 2

## 2015-02-01 MED ORDER — NITROGLYCERIN 1 MG/10 ML FOR IR/CATH LAB
INTRA_ARTERIAL | Status: DC | PRN
  Administered 2015-02-01: 10:00:00

## 2015-02-01 MED ORDER — AMLODIPINE BESYLATE 10 MG PO TABS
10.0000 mg | ORAL_TABLET | Freq: Every day | ORAL | Status: DC
  Administered 2015-02-02: 09:00:00 10 mg via ORAL
  Filled 2015-02-01: qty 1

## 2015-02-01 MED ORDER — SODIUM CHLORIDE 0.9 % WEIGHT BASED INFUSION
1.0000 mL/kg/h | INTRAVENOUS | Status: DC
  Administered 2015-02-01: 1 mL/kg/h via INTRAVENOUS

## 2015-02-01 MED ORDER — ACETAMINOPHEN 325 MG PO TABS: 650.0000 mg | ORAL_TABLET | ORAL | Status: DC | PRN

## 2015-02-01 MED ORDER — HYDROCHLOROTHIAZIDE 25 MG PO TABS
25.0000 mg | ORAL_TABLET | Freq: Every day | ORAL | Status: DC
  Administered 2015-02-02: 25 mg via ORAL
  Filled 2015-02-01: qty 1

## 2015-02-01 MED ORDER — MORPHINE SULFATE (PF) 2 MG/ML IV SOLN: 2.0000 mg | INTRAVENOUS | Status: DC | PRN

## 2015-02-01 MED ORDER — GABAPENTIN 300 MG PO CAPS: 300.0000 mg | ORAL_CAPSULE | Freq: Every day | ORAL | Status: DC

## 2015-02-01 MED ORDER — LOSARTAN POTASSIUM-HCTZ 100-25 MG PO TABS: 1.0000 | ORAL_TABLET | Freq: Every day | ORAL | Status: DC

## 2015-02-01 MED ORDER — ASPIRIN EC 81 MG PO TBEC
81.0000 mg | DELAYED_RELEASE_TABLET | Freq: Every day | ORAL | Status: DC
  Administered 2015-02-02: 09:00:00 81 mg via ORAL
  Filled 2015-02-01: qty 1

## 2015-02-01 MED ORDER — SODIUM CHLORIDE 0.9 % WEIGHT BASED INFUSION
3.0000 mL/kg/h | INTRAVENOUS | Status: DC
  Administered 2015-02-01: 3 mL/kg/h via INTRAVENOUS

## 2015-02-01 MED ORDER — HYDRALAZINE HCL 20 MG/ML IJ SOLN
10.0000 mg | INTRAMUSCULAR | Status: DC | PRN
  Administered 2015-02-01 (×2): 10 mg via INTRAVENOUS
  Filled 2015-02-01 (×3): qty 1

## 2015-02-01 MED ORDER — CLOPIDOGREL BISULFATE 300 MG PO TABS
ORAL_TABLET | ORAL | Status: AC
  Filled 2015-02-01: qty 1

## 2015-02-01 MED ORDER — SODIUM CHLORIDE 0.9 % IV SOLN
250.0000 mg | INTRAVENOUS | Status: DC | PRN
  Administered 2015-02-01: 1.75 mg/kg/h via INTRAVENOUS

## 2015-02-01 MED ORDER — ANGIOPLASTY BOOK
Freq: Once | Status: AC
  Administered 2015-02-01: 13:00:00
  Filled 2015-02-01: qty 1

## 2015-02-01 MED ORDER — ONDANSETRON HCL 4 MG/2ML IJ SOLN: 4.0000 mg | Freq: Four times a day (QID) | INTRAMUSCULAR | Status: DC | PRN

## 2015-02-01 MED ORDER — IMIQUIMOD 5 % EX CREA: 1.0000 "application " | TOPICAL_CREAM | Freq: Every day | CUTANEOUS | Status: DC

## 2015-02-01 SURGICAL SUPPLY — 21 items
BALLN EMERGE MR 2.0X12 (BALLOONS) ×2
BALLOON EMERGE MR 2.0X12 (BALLOONS) ×1 IMPLANT
CATH INFINITI 5 FR RCB (CATHETERS) ×2 IMPLANT
CATH INFINITI 5FR ANG PIGTAIL (CATHETERS) ×2 IMPLANT
CATH INFINITI 5FR MULTPACK ANG (CATHETERS) ×2 IMPLANT
CATH VISTA GUIDE RCB (CATHETERS) ×2 IMPLANT
DEVICE SPIDERFX EMB PROT 5MM (WIRE) ×2 IMPLANT
DEVICE SPIDERFX EMB PROT 6MM (WIRE) ×2 IMPLANT
KIT ENCORE 26 ADVANTAGE (KITS) ×2 IMPLANT
KIT HEART LEFT (KITS) ×2 IMPLANT
PACK CARDIAC CATHETERIZATION (CUSTOM PROCEDURE TRAY) ×2 IMPLANT
SHEATH PINNACLE 5F 10CM (SHEATH) ×2 IMPLANT
SHEATH PINNACLE 6F 10CM (SHEATH) ×2 IMPLANT
STENT SYNERGY DES 3X32 (Permanent Stent) ×2 IMPLANT
SYR MEDRAD MARK V 150ML (SYRINGE) ×4 IMPLANT
TRANSDUCER W/STOPCOCK (MISCELLANEOUS) ×2 IMPLANT
TUBING CIL FLEX 10 FLL-RA (TUBING) ×2 IMPLANT
TUBING CONTRAST HIGH PRESS 20 (MISCELLANEOUS) ×2 IMPLANT
WIRE ASAHI PROWATER 180CM (WIRE) ×2 IMPLANT
WIRE EMERALD 3MM-J .035X150CM (WIRE) ×2 IMPLANT
WIRE EMERALD 3MM-J .035X260CM (WIRE) IMPLANT

## 2015-02-01 NOTE — Progress Notes (Signed)
Patient's wife Amedeo Plenty and daughter [name] at bedside at this time. Stent card given to patient's daughter [name].

## 2015-02-01 NOTE — Progress Notes (Signed)
Patient arrived to floor A&O by four. Non labored breathing noted. Patient states he is in no pain. Right groin sheath in place, level 0, no bleeding or hematoma noted. Angiomax infusing during transfer. Attempted to call patient's wife [name] three times, unable to get an answer. Will continue to call. Patient was given his stent card, placed it at bedside. Emptied 200cc of urine from urinal. Call bell placed in reach and patient educated to use call bell for assisted and to keep HOB less than 30 degrees, also to remain on bedrest until informed other wise. Will continue to monitor. No S/S of distress noted or complaints voiced at this time.

## 2015-02-01 NOTE — Progress Notes (Signed)
   02/01/15 1600  Clinical Encounter Type  Visited With Health care provider  Visit Type Initial  Referral From Nurse  Consult/Referral To Chaplain  Chaplain visited with healthcare provider; Chaplain was unable to see patient; Chaplain will visit with Pt on tomorrow

## 2015-02-01 NOTE — Interval H&P Note (Signed)
Cath Lab Visit (complete for each Cath Lab visit)  Clinical Evaluation Leading to the Procedure:   ACS: No.  Non-ACS:    Anginal Classification: CCS III  Anti-ischemic medical therapy: Maximal Therapy (2 or more classes of medications)  Non-Invasive Test Results: Intermediate-risk stress test findings: cardiac mortality 1-3%/year  Prior CABG: Previous CABG      History and Physical Interval Note:  02/01/2015 8:42 AM  Robert Burns  has presented today for surgery, with the diagnosis of cp  The various methods of treatment have been discussed with the patient and family. After consideration of risks, benefits and other options for treatment, the patient has consented to  Procedure(s): Left Heart Cath and Coronary Angiography (N/A) as a surgical intervention .  The patient's history has been reviewed, patient examined, no change in status, stable for surgery.  I have reviewed the patient's chart and labs.  Questions were answered to the patient's satisfaction.     Quay Burow

## 2015-02-01 NOTE — Progress Notes (Addendum)
Site area: right groin  Site Prior to Removal:  Level 0  Pressure Applied For 25 MINUTES    Minutes Beginning at 1705  Manual:   Yes.    Patient Status During Pull:  Patient remained A&O by four. Non labored breathing. No S/S of distress noted or complaints voiced. Tolerated well.   Post Pull Groin Site:  Level 0  Post Pull Instructions Given:  Yes.    Post Pull Pulses Present:  Yes.    Dressing Applied:  Yes.    Comments:  Patient tolerated well. Instructed to remain on bedrest for 6 hours. Instructed not to bend leg and to inform nursing staff if bleeding occurs. No bleeding or hematoma noted. No S/S of distress noted or complaints voiced. Call bell is in reach. Will continue to monitor.

## 2015-02-01 NOTE — H&P (View-Only) (Signed)
Mr. Desmith returns today for follow-up of his recent Myoview stress test which I ordered because of new onset angina. This was performed 01/26/15 and showed mild to moderate anterolateral ischemia with LV dysfunction. He did not benefit from a long-acting oral nitrate. I suspect the abnormality on stress testing is due to either a high-grade lesion or occlusion of his diagonal branch being graft. Based on this, I decided to proceed with outpatient diagnostic coronary arteriography this coming Monday through the right femoral approach. The patient is agreeable to this.The patient understands that risks included but are not limited to stroke (1 in 1000), death (1 in 66), kidney failure [usually temporary] (1 in 500), bleeding (1 in 200), allergic reaction [possibly serious] (1 in 200). The patient understands and agrees to proceed

## 2015-02-02 ENCOUNTER — Other Ambulatory Visit: Payer: Self-pay | Admitting: Physician Assistant

## 2015-02-02 ENCOUNTER — Encounter (HOSPITAL_COMMUNITY): Payer: Self-pay | Admitting: Physician Assistant

## 2015-02-02 DIAGNOSIS — I25119 Atherosclerotic heart disease of native coronary artery with unspecified angina pectoris: Secondary | ICD-10-CM | POA: Diagnosis not present

## 2015-02-02 DIAGNOSIS — I739 Peripheral vascular disease, unspecified: Secondary | ICD-10-CM | POA: Diagnosis not present

## 2015-02-02 DIAGNOSIS — R931 Abnormal findings on diagnostic imaging of heart and coronary circulation: Secondary | ICD-10-CM

## 2015-02-02 DIAGNOSIS — I2583 Coronary atherosclerosis due to lipid rich plaque: Secondary | ICD-10-CM | POA: Diagnosis not present

## 2015-02-02 DIAGNOSIS — I25810 Atherosclerosis of coronary artery bypass graft(s) without angina pectoris: Secondary | ICD-10-CM | POA: Diagnosis not present

## 2015-02-02 DIAGNOSIS — I25719 Atherosclerosis of autologous vein coronary artery bypass graft(s) with unspecified angina pectoris: Secondary | ICD-10-CM | POA: Diagnosis not present

## 2015-02-02 DIAGNOSIS — I2582 Chronic total occlusion of coronary artery: Secondary | ICD-10-CM | POA: Diagnosis not present

## 2015-02-02 LAB — CBC
HEMATOCRIT: 39.9 % (ref 39.0–52.0)
HEMOGLOBIN: 13.3 g/dL (ref 13.0–17.0)
MCH: 28.7 pg (ref 26.0–34.0)
MCHC: 33.3 g/dL (ref 30.0–36.0)
MCV: 86 fL (ref 78.0–100.0)
PLATELETS: 183 10*3/uL (ref 150–400)
RBC: 4.64 MIL/uL (ref 4.22–5.81)
RDW: 15.1 % (ref 11.5–15.5)
WBC: 11.3 10*3/uL — ABNORMAL HIGH (ref 4.0–10.5)

## 2015-02-02 LAB — GLUCOSE, CAPILLARY
GLUCOSE-CAPILLARY: 208 mg/dL — AB (ref 65–99)
Glucose-Capillary: 135 mg/dL — ABNORMAL HIGH (ref 65–99)

## 2015-02-02 LAB — BASIC METABOLIC PANEL
ANION GAP: 9 (ref 5–15)
BUN: 13 mg/dL (ref 6–20)
CO2: 27 mmol/L (ref 22–32)
Calcium: 9.2 mg/dL (ref 8.9–10.3)
Chloride: 102 mmol/L (ref 101–111)
Creatinine, Ser: 0.84 mg/dL (ref 0.61–1.24)
GFR calc Af Amer: >60 mL/min (ref >60)
GFR calc non Af Amer: >60 mL/min (ref >60)
GLUCOSE: 123 mg/dL — AB (ref 65–99)
POTASSIUM: 3.3 mmol/L — AB (ref 3.5–5.1)
Sodium: 138 mmol/L (ref 135–145)

## 2015-02-02 MED ORDER — ISOSORBIDE MONONITRATE ER 30 MG PO TB24: 30.0000 mg | ORAL_TABLET | Freq: Every day | ORAL | Status: DC

## 2015-02-02 MED ORDER — POTASSIUM CHLORIDE CRYS ER 20 MEQ PO TBCR
40.0000 meq | EXTENDED_RELEASE_TABLET | ORAL | Status: AC
  Administered 2015-02-02 (×2): 40 meq via ORAL
  Filled 2015-02-02 (×2): qty 2

## 2015-02-02 MED ORDER — NITROGLYCERIN 0.4 MG SL SUBL: 0.4000 mg | SUBLINGUAL_TABLET | SUBLINGUAL | Status: DC | PRN

## 2015-02-02 MED ORDER — CLOPIDOGREL BISULFATE 75 MG PO TABS: 75.0000 mg | ORAL_TABLET | Freq: Every day | ORAL | Status: DC

## 2015-02-02 NOTE — Progress Notes (Signed)
Patient Name: Robert Burns Date of Encounter: 02/02/2015  Primary Cardiologist: Dr. Gwenlyn Found / Dr. Sallyanne Kuster (manage PPM)    Principal Problem:   Abnormal nuclear stress test Active Problems:   Diabetes mellitus ([place])   Essential hypertension   CAD (coronary artery disease), Hx CABG 2004 LIMA-LAD, VG-DIAG BRANCH; seq VG -ramus & OM branch; VG-PDA    SSS (sick sinus syndrome), medtronic adapta   OSA on CPAP   PVD (peripheral vascular disease) ([facility])   Ischemic chest pain ([facility])    SUBJECTIVE  Denies any CP or SOB. R groin cath site stable. Feeling well.  CURRENT MEDS . amLODipine  10 mg Oral Daily  . aspirin EC  81 mg Oral Daily  . cloNIDine  0.1 mg Oral BID  . clopidogrel  75 mg Oral Q breakfast  . feeding supplement (ENSURE ENLIVE)  237 mL Oral BID BM  . gabapentin  300 mg Oral QHS  . glipiZIDE  5 mg Oral QAC breakfast  . losartan  100 mg Oral Daily   And  . hydrochlorothiazide  25 mg Oral Daily  . Influenza vac split quadrivalent PF  0.5 mL Intramuscular Tomorrow-1000  . isosorbide mononitrate  15 mg Oral Daily  . metoprolol tartrate  25 mg Oral BID  . sodium chloride  3 mL Intravenous Q12H    OBJECTIVE  Filed Vitals:   02/01/15 1815 02/01/15 2000 02/01/15 2100 02/02/15 0216  BP: 134/51 151/57 161/54 176/59  Pulse:  60  72  Temp:  98.1 F (36.7 C)  98.5 F (36.9 C)  TempSrc:  Oral  Oral  Resp: 13 19 18 19   Height:      Weight:    181 lb 3.5 oz (82.2 kg)  SpO2: 99% 98% 97% 97%    Intake/Output Summary (Last 24 hours) at 02/02/15 0811 Last data filed at 02/01/15 2128  Gross per 24 hour  Intake 1472.01 ml  Output   2076 ml  Net -603.99 ml   Filed Weights   02/01/15 0739 02/02/15 0216  Weight: 175 lb (79.379 kg) 181 lb 3.5 oz (82.2 kg)    PHYSICAL EXAM  General: Pleasant, NAD. Neuro: Alert and oriented X 3. Moves all extremities spontaneously. Psych: Normal affect. HEENT:  Normal  Neck: Supple without bruits or JVD. Lungs:  Resp regular and  unlabored, CTA. Heart: RRR no s3, s4, or murmurs. R femoral cath site stable. PPM at R side Abdomen: Soft, non-tender, non-distended, BS + x 4.  Extremities: No clubbing, cyanosis or edema. DP/PT/Radials 2+ and equal bilaterally.  Accessory Clinical Findings  CBC  Recent Labs  02/02/15 0353  WBC 11.3*  HGB 13.3  HCT 39.9  MCV 86.0  PLT 209   Basic Metabolic Panel  Recent Labs  02/02/15 0353  NA 138  K 3.3*  CL 102  CO2 27  GLUCOSE 123*  BUN 13  CREATININE 0.84  CALCIUM 9.2    TELE Paced rhythm with HR 60s    ECG  Paced rhythm  Myoview 01/21/2015  Study Highlights     Nuclear stress EF: 43%.  The left ventricular ejection fraction is moderately decreased (30-44%).  Defect 1: There is a small defect of mild severity present in the mid anterolateral location.  Findings consistent with ischemia.  This is an intermediate risk study.  Intermediate risk lexiscan nuclear study demonstrating a small region of mild ischemia in the mid anterior-anterolateral wall with associated mild focal hypocontractility and EF 43%.  Radiology/Studies  Dg Chest 2 View  01/27/2015  CLINICAL DATA:  Preprocedure for cardiac catheterization. Peripheral artery disease. Hyperlipidemia. EXAM: CHEST  2 VIEW COMPARISON:  09/19/2010 FINDINGS: Sequelae of prior CABG are again identified. Pacemaker remains in place with leads terminating over the right atrium and right ventricle, unchanged. Cardiomediastinal silhouette is unchanged and within normal limits. The lungs are mildly hyperinflated and clear. No pleural effusion or pneumothorax is identified. Thoracic spondylosis is noted. IMPRESSION: No active cardiopulmonary disease. Electronically Signed   By: Logan Bores M.D.   On: 01/27/2015 16:21    ASSESSMENT AND PLAN  1. 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 Discussed with Dr. Gwenlyn Found, planning for LLE doppler for L external iliac artery and PAD graft next week, and followup with Dr. Gwenlyn Found in 2-3 weeks to assess his symptom and decide whether to pursue staged PCI.   2. CAD s/p CABG 2004 LIMA to LAD, SVG to Diag, sequential SVG to ramus and OM, SVG to PDA  3. PAD   4. SSS s/p MDT PPM 02/2007 by Dr. Sallyanne Kuster 5. OSA on CPAP 6. HTN 7. HLD  Signed, Almyra Deforest PA-C Pager: [phone]   Agree with note by Almyra Deforest PA-C  S/P RCA-PDA SVG PCI/DES. Residual Diag SVG disease and LEIA stenosis. No CP. Groin OK. Labs OK. D/C home on DAPT. LEA  At [facility] next week to further eval LEIA stenosis then ROV to sched staged Diag SVG intervention.    Lorretta Harp, M.D., [place], [facility], [place] [address] [place], [place]  [id number]  [phone] 02/02/2015 8:39 AM

## 2015-02-02 NOTE — Discharge Summary (Signed)
Discharge Summary   Patient ID: Robert Burns,  MRN: [id number], DOB/AGE: May 29, 1940 74 y.o.  Admit date: 02/01/2015 Discharge date: 02/02/2015  Primary Care Provider: KCLEXNT,ZGYFV L Primary Cardiologist: Dr. Gwenlyn Found / Dr. Sallyanne Kuster (manage PPM)  Discharge Diagnoses Principal Problem:   Abnormal nuclear stress test Active Problems:   Diabetes mellitus ([place])   Essential hypertension   CAD (coronary artery disease), Hx CABG 2004 LIMA-LAD, VG-DIAG BRANCH; seq VG -ramus & OM branch; VG-PDA    SSS (sick sinus syndrome), medtronic adapta   OSA on CPAP   PVD (peripheral vascular disease) (HCC)   Ischemic chest pain (HCC)   Allergies Allergies  Allergen Reactions  . Peanut-Containing Drug Products Anaphylaxis and Other (See Comments)    Tongue swelling is severe  . Ace Inhibitors Other (See Comments)    Cough  . Clonidine Hcl Other (See Comments)    Patch only - skin irritation  . Coreg [Carvedilol] Itching  . Diltiazem Hcl Other (See Comments)    Unknown  . Mobic [Meloxicam] Other (See Comments)    GI upset  . Ramipril Other (See Comments)    Unknown    Procedures  Cardiac catheterization 02/01/2015 Conclusion     Prox LAD to Mid LAD lesion, 100% stenosed.  Ost 2nd Diag lesion, 100% stenosed.  Prox RCA to Mid RCA lesion, 100% stenosed.  SVG was injected is normal in caliber.  There is severe disease in the graft.  95% proximal SVG to PDA  was injected is normal in caliber.  There is mild disease in the graft.  60% stenosis at the insertion of the LIMA to the LAD  Dist LAD lesion, 80% stenosed.  Insertion lesion, 60% stenosed.  SVG was injected is normal in caliber.  There is moderate focal disease in the graft.  70% stenosis in the SVG proximal to the OM insertion  Subtotally occluded diagonal branch vein graft at the aorta  Origin lesion, 99% stenosed.  Prox Graft lesion, 60% stenosed.  Prox Graft lesion, 95% stenosed. Post intervention,  there is a 0% residual stenosis.    IMPRESSION:Mr. Paver has high-grade proximal SVG to PDA disease. Also has a subtotally occluded vein graft to an diagonal branch. In addition, he has a high-grade left external iliac artery stenosis just proximal to the takeoff of his previously placed Dacron graft to his SFA. I successfully performed PCI and stenting of his SVG to the PDA with a Synergy drug-eluting stent using distal protection. He had an excellent angiographic result. The plan will be to discharge him home tomorrow on dual antibiotic therapy with the intent to perform staged diagonal branch SVG intervention. At some point, he will also need his left external iliac artery intervened on.    Hospital Course  The patient is a 74 year old Caucasian male with past medical history of PAD, SSS s/p MDT PPM 02/2007 by Dr. Sallyanne Kuster, OSA on CPAP, HTN, HLD and CAD s/p CABG 2004 LIMA to LAD, SVG to Diag, sequential SVG to ramus and OM, SVG to PDA. He had left lower extremity placement of interposition 6-mm Gore-Tex graft by Dr. Kellie Simmering. He was seen in the clinic on 01/12/2015, at which time he noticed anginal chest discomfort occurring both at rest and with exertion with left upper extremity radiation. After discussing with the patient, he agreed to obtain outpatient pharmacologic Myoview stress test to risk stratify him and rule out ischemic etiology. Stress test performed on 01/21/2015 showed EF 43%, small defect of mild severity present in mid anterolateral  location, finding consistent with ischemia, intermediate risk study. Given the abnormal Myoview result, patient agreed to undergo cardiac catheterization.  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 Postprocedure, he was placed on aspirin and Plavix. The plan is for possible staged PCI of SVG to diag later. I  have discussed with M.D., we will obtain left lower extremity arterial Doppler to assess left external iliac artery and PAD graft next week and arrange follow-up with Dr. Gwenlyn Found in 2-3 weeks to assess his symptom and decide whether or not to pursue staged PCI. I have emphasized on the importance of compliance with DAPT. He ambulated with cardiac rehabilitation without significant discomfort.   Prior to discharge, he does have elevated blood pressure, systolic blood pressures improved to 150 three hours after taking home medication. I have increased his Imdur to 30 mg daily. I have also instructed the family to obtain a home blood pressure twice a day and bring recordings to the next follow-up. I have instructed him to hold metformin for 48 hours after cath. He may restart on metformin on 02/04/2015. Note, he has a separate followup with Dr. Sallyanne Kuster on 10/28 for his PPM.    Discharge Vitals Blood pressure 165/64, pulse 66, temperature 98.3 F (36.8 C), temperature source Oral, resp. rate 20, height 5\' 8"  (1.727 m), weight 181 lb 3.5 oz (82.2 kg), SpO2 97 %.  Filed Weights   02/01/15 0739 02/02/15 0216  Weight: 175 lb (79.379 kg) 181 lb 3.5 oz (82.2 kg)    Labs  CBC  Recent Labs  02/02/15 0353  WBC 11.3*  HGB 13.3  HCT 39.9  MCV 86.0  PLT 981   Basic Metabolic Panel  Recent Labs  02/02/15 0353  NA 138  K 3.3*  CL 102  CO2 27  GLUCOSE 123*  BUN 13  CREATININE 0.84  CALCIUM 9.2    Disposition  Pt is being discharged home today in good condition.  Follow-up Plans & Appointments      Follow-up Information    Follow up with Sanda Klein, MD On 02/05/2015.   Specialty:  Cardiology   Why:  10:00am   Contact information:   [address] [place] [address] [phone]       Follow up with [facility] On 02/10/2015.   Specialty:  Cardiology   Why:  Left lower extremity arterial doppler. 1:30pm.    Contact information:   [address] [place] [phone]      Follow up with Tarri Fuller, PA-C On 02/23/2015.   Specialties:  Physician Assistant, Radiology, Interventional Cardiology   Why:  2:00pm   Contact information:   [address] [place] [address] [phone]       Discharge Medications    Medication List    TAKE these medications        acetaminophen 500 MG tablet  Commonly known as:  TYLENOL  Take 500 mg by mouth every 6 (six) hours as needed for moderate pain or headache.     amLODipine 10 MG tablet  Commonly known as:  NORVASC  Take 10 mg by mouth daily.     aspirin EC 81 MG tablet  Take 81 mg by mouth daily.     B COMPLEX-B12 PO  Take 1 tablet by mouth daily.  cloNIDine 0.1 MG tablet  Commonly known as:  CATAPRES  Take 0.1 mg by mouth 2 (two) times daily.     clopidogrel 75 MG tablet  Commonly known as:  PLAVIX  Take 1 tablet (75 mg total) by mouth daily with breakfast.     gabapentin 100 MG capsule  Commonly known as:  NEURONTIN  Take 300 mg by mouth daily.     glipiZIDE 5 MG tablet  Commonly known as:  GLUCOTROL  Take 5 mg by mouth daily.     imiquimod 5 % cream  Commonly known as:  ALDARA  Apply 1 application topically daily. Apply daily x 5 days then hold x 2 days then repeat.     isosorbide mononitrate 30 MG 24 hr tablet  Commonly known as:  IMDUR  Take 1 tablet (30 mg total) by mouth daily.     losartan-hydrochlorothiazide 100-25 MG tablet  Commonly known as:  HYZAAR  Take 1 tablet by mouth daily.     lovastatin 40 MG tablet  Commonly known as:  MEVACOR  Take 40 mg by mouth at bedtime.     magnesium hydroxide 400 MG/5ML suspension  Commonly known as:  MILK OF MAGNESIA  Take 30 mLs by mouth as needed for moderate constipation or indigestion.     metFORMIN 1000 MG tablet  Commonly known as:  GLUCOPHAGE  Take 1,000 mg by mouth 2 (two) times daily with a meal.     metoprolol tartrate 25 MG  tablet  Commonly known as:  LOPRESSOR  Take 25 mg by mouth 2 (two) times daily.     multivitamin capsule  Take 1 capsule by mouth daily.     nitroGLYCERIN 0.4 MG SL tablet  Commonly known as:  NITROSTAT  Place 1 tablet (0.4 mg total) under the tongue every 5 (five) minutes as needed for chest pain.     SLEEP AID PO  Take 1 tablet by mouth as needed (for sleep).     vitamin C 500 MG tablet  Commonly known as:  ASCORBIC ACID  Take 500 mg by mouth 2 (two) times daily.       Outstanding labs/procedure  LLE arterial doppler   Duration of Discharge Encounter   Greater than 30 minutes including physician time.  Hilbert Corrigan PA-C Pager: [phone] 02/02/2015, 12:47 PM

## 2015-02-02 NOTE — Progress Notes (Signed)
   02/02/15 1030  Clinical Encounter Type  Visited With Patient  Visit Type Spiritual support  Referral From Nurse  Spiritual Encounters  Spiritual Needs Prayer  Stress Factors  Patient Stress Factors Health changes  Patient requested prayer; visited with him to learn more about his situation and then shared prayers with him.

## 2015-02-02 NOTE — Discharge Instructions (Signed)
No driving for 24 hours. No lifting over 5 lbs for 1 week. No sexual activity for 1 week. Keep procedure site clean & dry. If you notice increased pain, swelling, bleeding or pus, call/return!  You may shower, but no soaking baths/hot tubs/pools for 1 week.   Please hold metformin for 48 hours after cardiac cath due to interaction with contrast dye.

## 2015-02-02 NOTE — Progress Notes (Signed)
CARDIAC REHAB PHASE I   PRE:  Rate/Rhythm: 73 paced  BP:  Sitting: 200/72 manual      SaO2: 98 RA  MODE:  Ambulation: 1000 ft   POST:  Rate/Rhythm:   BP:  Sitting: 202/86 manual         SaO2: 99 RA  Pt ambulated 1000 ft on RA, indepent, steady gait, tolerated well.  Pt c/o 1/10 chest tightness, denies dizziness, DOE, declined rest stop. Pt BP also elevated, RN notified. Completed PCI/stent education.  Reviewed risk factors, anti-platelet therapy, stent card, activity restrictions, ntg, exercise, heart healthy diet, carb counting, and phase 2 cardiac rehab. Pt verbalized understanding. Pt to return for staged intervention to SVG-diagonal, not appropriate for phase 2 referral at this time.  Pt to bed per pt request after walk, call bell within reach.   [phone]    Lenna Sciara, RN, BSN 02/02/2015 9:03 AM

## 2015-02-04 ENCOUNTER — Other Ambulatory Visit: Payer: Self-pay | Admitting: Cardiovascular Disease

## 2015-02-04 DIAGNOSIS — I739 Peripheral vascular disease, unspecified: Secondary | ICD-10-CM

## 2015-02-05 ENCOUNTER — Encounter: Payer: Self-pay | Admitting: Cardiovascular Disease

## 2015-02-05 ENCOUNTER — Ambulatory Visit (INDEPENDENT_AMBULATORY_CARE_PROVIDER_SITE_OTHER): Payer: Medicare HMO | Admitting: Cardiovascular Disease

## 2015-02-05 VITALS — BP 153/76 | HR 66 | Resp 16 | Ht 68.0 in | Wt 177.8 lb

## 2015-02-05 DIAGNOSIS — I739 Peripheral vascular disease, unspecified: Secondary | ICD-10-CM | POA: Diagnosis not present

## 2015-02-05 DIAGNOSIS — I10 Essential (primary) hypertension: Secondary | ICD-10-CM

## 2015-02-05 DIAGNOSIS — G4733 Obstructive sleep apnea (adult) (pediatric): Secondary | ICD-10-CM

## 2015-02-05 DIAGNOSIS — Z9989 Dependence on other enabling machines and devices: Secondary | ICD-10-CM

## 2015-02-05 DIAGNOSIS — I70212 Atherosclerosis of native arteries of extremities with intermittent claudication, left leg: Secondary | ICD-10-CM

## 2015-02-05 DIAGNOSIS — I2510 Atherosclerotic heart disease of native coronary artery without angina pectoris: Secondary | ICD-10-CM | POA: Diagnosis not present

## 2015-02-05 DIAGNOSIS — I495 Sick sinus syndrome: Secondary | ICD-10-CM | POA: Diagnosis not present

## 2015-02-05 NOTE — Patient Instructions (Signed)
SEND MONTHLY PACEMAKER DOWNLOADS FROM [facility].  Dr. Sallyanne Kuster recommends that you schedule a follow-up appointment in: [place]

## 2015-02-07 ENCOUNTER — Encounter: Payer: Self-pay | Admitting: Cardiovascular Disease

## 2015-02-07 NOTE — Progress Notes (Signed)
Patient ID: Robert Burns, male   DOB: 12/08/1940, 74 y.o.   MRN: [id number]     Cardiology Office Note   Date:  02/07/2015   ID:  Robert Burns, DOB 1940-09-21, MRN [id number]  PCP:  Robert Coyer, MD  Cardiologist:   Robert Klein, MD   Chief Complaint  Patient presents with  . Follow-up    To discuss generator change out.  Cardiac cath w/stent tuesday.  No pain since.      History of Present Illness: Robert Burns is a 73 y.o. male who presents for  Pacemaker check. This is his first pacemaker interrogation since November 2014. He admits that he has been noncompliant with remote pacemaker downloads. He has not had any symptoms of arrhythmia. He denies syncope or palpitations.  He was just hospitalized with angina and abnormal nuclear stress test. Cardiac catheterization on October 24 showed a high-grade stenosis in the proximal segment of the saphenous vein graft bypass to the PDA. He also had a subtotally occluded vein graft to the diagonal artery. He underwent placement of a drug-eluting stent in the SVG to PDA with the plan to perform staged interventional to the other SVG-diagonal lesion.  Was also noted to have a high-grade stenosis in the left external iliac artery just proximal to the proximal anastomosis of his iliofemoral bypass.   interrogation of his pacemaker which is a Medtronic Adapta device implanted in 2007 for sinus node dysfunction shows normal device function but also shows that the generator is approaching elective replacement ( voltage 2.64 folds, estimated longevity 5 months). There is 99.1% atrial pacing but only 0.8% ventricular pacing.  There is no atrial activity whatsoever when pacing this temporarily suspended and there is no immediate ventricular escape rhythm. He appears to be pacemaker dependent. No episodes of mode switch or recent episodes of high ventricular rates have been detected. He most recently had a 4 second episode of high ventricular rate in  August 2015.  All lead parameters are excellent. Atrial lead impedance is 523 ohm with a threshold of 1 V at 0.4 ms pulse width. There are no detectable P waves. Ventricular lead shows R waves of 16-22 mV.  The impedance is 661 ohms a capture threshold was 1.625 at 0.4 ms  Past Medical History  Diagnosis Date  . Hypertension   . Leg pain   . Hyperlipidemia   . CAD (coronary artery disease)     s/p CABG  06/2002; last nuc 07/18/11 normal EF and negative for ischemia; myoview 01/21/15 abnormal, cath 02/01/15 DES to prox SVG to PDA, subtotal occlusion of SVG to Diag to be treated later.  . SSS (sick sinus syndrome) ([place])     Medtronic Adapta, 10/13/05  . 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 Cancer (HCC)     Right Shoulder, Left Leg- BCC, SCC, AND MELANOMA  . Presence of permanent cardiac pacemaker   . OSA on CPAP   . Type II diabetes mellitus ([place])   . Arthritis     "all over"  . Urgency of urination     Past Surgical History  Procedure Laterality Date  . Coronary artery bypass graft  06/2002    x5, LIMA-LAD;VG- Diag; seq VG- ramus & OM branch; VG-PDA  . Tonsillectomy and adenoidectomy    . Tumor excision Right ~ 2005  cancerous tumor removed from shoulder  . Tumor excision Right ~ 2000    benign tumor removed from under shoulder  . Knee surgery Right 1950's    "broke my lower leg; had to put pin in my knee to keep lower leg in place til it healed"  . Foot fracture surgery Left   . Femoral artery stent Left ~ 2014    "taken out of my leg; couldn' catorgorize what kind so the put it under all 3"; cataroziepitheloid hemanioendotheliomau  . Tumor excision Left     resection of Lt SFA wth interposition of Gore-Tex graft  . Cardiac catheterization N/A 02/01/2015    Procedure: Left Heart Cath and Coronary Angiography;  Surgeon:  Robert Harp, MD;  Location: [facility];  Service: Cardiovascular;  Laterality: N/A;  . Cardiac catheterization N/A 02/01/2015    Procedure: Coronary Stent Intervention;  Surgeon: Robert Harp, MD;  Location: [facility];  Service: Cardiovascular;  Laterality: N/A;  . Insert / replace / remove pacemaker  10/13/05    right side, medtronic Adapta  . Knee hardware removal Right 1950's    "3-4 months after the insertion"  . Cardiac catheterization  06/2002    "just before bypass OR"  . Fracture surgery    . Coronary angioplasty       Current Outpatient Prescriptions  Medication Sig Dispense Refill  . acetaminophen (TYLENOL) 500 MG tablet Take 500 mg by mouth every 6 (six) hours as needed for moderate pain or headache.     [name] amLODipine (NORVASC) 10 MG tablet Take 10 mg by mouth daily.      [name] aspirin EC 81 MG tablet Take 81 mg by mouth daily.      . B Complex Vitamins (B COMPLEX-B12 PO) Take 1 tablet by mouth daily.     . cloNIDine (CATAPRES) 0.1 MG tablet Take 0.1 mg by mouth 2 (two) times daily.    . clopidogrel (PLAVIX) 75 MG tablet Take 1 tablet (75 mg total) by mouth daily with breakfast. 90 tablet 3  . Doxylamine Succinate, Sleep, (SLEEP AID PO) Take 1 tablet by mouth as needed (for sleep).     . gabapentin (NEURONTIN) 100 MG capsule Take 300 mg by mouth daily.     [name] glipiZIDE (GLUCOTROL) 5 MG tablet Take 5 mg by mouth daily.      . imiquimod (ALDARA) 5 % cream Apply 1 application topically daily. Apply daily x 5 days then hold x 2 days then repeat.    . isosorbide mononitrate (IMDUR) 30 MG 24 hr tablet Take 1 tablet (30 mg total) by mouth daily. 30 tablet 11  . losartan-hydrochlorothiazide (HYZAAR) 100-25 MG per tablet Take 1 tablet by mouth daily.      [name] lovastatin (MEVACOR) 40 MG tablet Take 40 mg by mouth at bedtime.      . magnesium hydroxide (MILK OF MAGNESIA) 400 MG/5ML suspension Take 30 mLs by mouth as needed for moderate constipation or indigestion.     .  metFORMIN (GLUCOPHAGE) 1000 MG tablet Take 1,000 mg by mouth 2 (two) times daily with a meal.      . metoprolol tartrate (LOPRESSOR) 25 MG tablet Take 25 mg by mouth 2 (two) times daily.      . Multiple Vitamin (MULTIVITAMIN) capsule Take 1 capsule by mouth daily.      . nitroGLYCERIN (NITROSTAT) 0.4 MG SL tablet Place 1 tablet (0.4 mg total) under the tongue every 5 (five) minutes as  needed for chest pain. 25 tablet 3  . vitamin C (ASCORBIC ACID) 500 MG tablet Take 500 mg by mouth 2 (two) times daily.       No current facility-administered medications for this visit.    Allergies:   Peanut-containing drug products; Ace inhibitors; Clonidine hcl; Coreg; Diltiazem hcl; Mobic; and Ramipril    Social History:  The patient  reports that he quit smoking about 41 years ago. His smoking use included Cigarettes and Pipe. He quit after 5 years of use. He has never used smokeless tobacco. He reports that he does not drink alcohol or use illicit drugs.   Family History:  The patient's family history includes Coronary artery disease in his mother; Diabetes in his father and sister; Heart attack in his father and mother; Heart disease in his father, mother, and sister; Hyperlipidemia in his father; Hypertension in his mother and sister.    ROS:  Please see the history of present illness.    Otherwise, review of systems positive for none.   All other systems are reviewed and negative.    PHYSICAL EXAM: VS:  BP 153/76 mmHg  Pulse 66  Resp 16  Ht 5\' 8"  (1.727 m)  Wt 177 lb 12.8 oz (80.65 kg)  BMI 27.04 kg/m2 , BMI Body mass index is 27.04 kg/(m^2).  General: Alert, oriented x3, no distress Head: no evidence of trauma, PERRL, EOMI, no exophtalmos or lid lag, no myxedema, no xanthelasma; normal ears, nose and oropharynx Neck: normal jugular venous pulsations and no hepatojugular reflux; brisk carotid pulses without delay and no carotid bruits Chest: clear to auscultation, no signs of consolidation by  percussion or palpation, normal fremitus, symmetrical and full respiratory excursions,  Healthy right subclavian pacemaker site Cardiovascular: normal position and quality of the apical impulse, regular rhythm, normal first and second heart sounds, no  murmurs, rubs or gallops Abdomen: no tenderness or distention, no masses by palpation, no abnormal pulsatility or arterial bruits, normal bowel sounds, no hepatosplenomegaly Extremities: no clubbing, cyanosis or edema; 2+ radial, ulnar and brachial pulses bilaterally; 2+ right femoral, posterior tibial and dorsalis pedis pulses; 2+ left femoral, posterior tibial and dorsalis pedis pulses; no subclavian or femoral bruits Neurological: grossly nonfocal Psych: euthymic mood, full affect   EKG:  EKG is not ordered today.   Recent Labs: 01/27/2015: TSH 0.964 02/02/2015: BUN 13; Creatinine, Ser 0.84; Hemoglobin 13.3; Platelets 183; Potassium 3.3*; Sodium 138    Lipid Panel No results found for: CHOL, TRIG, HDL, CHOLHDL, VLDL, LDLCALC, LDLDIRECT    Wt Readings from Last 3 Encounters:  02/05/15 177 lb 12.8 oz (80.65 kg)  02/02/15 181 lb 3.5 oz (82.2 kg)  01/27/15 180 lb (81.647 kg)      Other studies Reviewed: Additional studies/ records that were reviewed today include:  Records of recent hospitalization and images of recent cardiac catheterization.   ASSESSMENT AND PLAN:  1.  Sinus node arrest , pacemaker dependent 2.  Normally functioning dual-chamber permanent pacemaker, expected to reach ERI in approximately 5 months. Will start monthly remote downloads. 3.   CADs/p CABG  And recent drug-eluting stent to SVG-PDA, with plan for SVG -diagonal PCI in the near future. Will perform pacemaker generator change out without stopping dual antiplatelet therapy. Reinforced the critical importance of compliance with this regimen. 4.  Essential hypertension with slightly elevated blood pressure today but normal blood pressure during his recent  hospital stay 5.  Type 2 diabetes mellitus on oral antidiabetics 6. PAD  Of both  lower extremities with extensive history of previous problems and now with a high-grade stenosis in the left external iliac artery 7.  hyperlipidemia    Current medicines are reviewed at length with the patient today.  The patient does not have concerns regarding medicines.  The following changes have been made:  no change  Labs/ tests ordered today include:  Orders Placed This Encounter  Procedures  . EKG 12-Lead    Patient Instructions  SEND MONTHLY PACEMAKER DOWNLOADS FROM [facility].  Dr. Sallyanne Kuster recommends that you schedule a follow-up appointment in: ONE YEAR       SignedSanda Klein, MD  02/07/2015 10:32 AM    Robert Klein, MD, [facility] [phone] office [phone] pager

## 2015-02-10 ENCOUNTER — Ambulatory Visit (HOSPITAL_COMMUNITY)
Admission: RE | Admit: 2015-02-10 | Discharge: 2015-02-10 | Disposition: A | Discharge status: Home / Self Care | Payer: Medicare HMO | Source: Ambulatory Visit | Attending: Physician Assistant | Admitting: Physician Assistant

## 2015-02-10 DIAGNOSIS — I739 Peripheral vascular disease, unspecified: Secondary | ICD-10-CM

## 2015-02-11 ENCOUNTER — Telehealth: Payer: Self-pay | Admitting: *Deleted

## 2015-02-11 NOTE — Telephone Encounter (Signed)
LMTCB/SSS  Patient needs to send monthly transmissions per [facility]. Nearing ERI.

## 2015-02-16 ENCOUNTER — Encounter: Payer: Self-pay | Admitting: Cardiovascular Disease

## 2015-02-23 ENCOUNTER — Encounter: Payer: Self-pay | Admitting: Physician Assistant

## 2015-02-23 ENCOUNTER — Encounter: Payer: Self-pay | Admitting: Cardiovascular Disease

## 2015-02-23 ENCOUNTER — Ambulatory Visit (INDEPENDENT_AMBULATORY_CARE_PROVIDER_SITE_OTHER): Payer: Medicare HMO | Admitting: Physician Assistant

## 2015-02-23 VITALS — BP 130/76 | HR 85 | Ht 68.0 in | Wt 181.9 lb

## 2015-02-23 DIAGNOSIS — I10 Essential (primary) hypertension: Secondary | ICD-10-CM | POA: Diagnosis not present

## 2015-02-23 DIAGNOSIS — Z9989 Dependence on other enabling machines and devices: Secondary | ICD-10-CM

## 2015-02-23 DIAGNOSIS — Z01818 Encounter for other preprocedural examination: Secondary | ICD-10-CM | POA: Diagnosis not present

## 2015-02-23 DIAGNOSIS — I25810 Atherosclerosis of coronary artery bypass graft(s) without angina pectoris: Secondary | ICD-10-CM | POA: Diagnosis not present

## 2015-02-23 DIAGNOSIS — D689 Coagulation defect, unspecified: Secondary | ICD-10-CM | POA: Diagnosis not present

## 2015-02-23 DIAGNOSIS — G4733 Obstructive sleep apnea (adult) (pediatric): Secondary | ICD-10-CM

## 2015-02-23 DIAGNOSIS — E785 Hyperlipidemia, unspecified: Secondary | ICD-10-CM

## 2015-02-23 DIAGNOSIS — I739 Peripheral vascular disease, unspecified: Secondary | ICD-10-CM

## 2015-02-23 LAB — CUP PACEART INCLINIC DEVICE CHECK
Battery Impedance: 4580 Ohm
Battery Remaining Longevity: 5 mo
Brady Statistic AP VP Percent: 0.8 %
Brady Statistic AP VS Percent: 98.3 %
Brady Statistic AS VS Percent: 0.9 %
Implantable Lead Implant Date: 20070706
Implantable Lead Location: 753860
Implantable Lead Model: 5092
Lead Channel Impedance Value: 523 Ohm
Lead Channel Impedance Value: 661 Ohm
Lead Channel Pacing Threshold Pulse Width: 0.4 ms
Lead Channel Pacing Threshold Pulse Width: 0.4 ms
Lead Channel Sensing Intrinsic Amplitude: 16 mV
Lead Channel Setting Pacing Amplitude: 2.25 V
Lead Channel Setting Pacing Amplitude: 2.75 V
Lead Channel Setting Sensing Sensitivity: 5.6 mV
MDC IDC LEAD IMPLANT DT: 20070706
MDC IDC LEAD LOCATION: 753859
MDC IDC MSMT BATTERY VOLTAGE: 2.64 V
MDC IDC MSMT LEADCHNL RA PACING THRESHOLD AMPLITUDE: 1 V
MDC IDC MSMT LEADCHNL RV PACING THRESHOLD AMPLITUDE: 1.625 V
MDC IDC SESS DTM: 20161115112708
MDC IDC SET LEADCHNL RV PACING PULSEWIDTH: 0.4 ms
MDC IDC STAT BRADY AS VP PERCENT: 0.1 % — AB

## 2015-02-23 LAB — CBC
HEMATOCRIT: 41.1 % (ref 39.0–52.0)
Hemoglobin: 13.3 g/dL (ref 13.0–17.0)
MCH: 28.1 pg (ref 26.0–34.0)
MCHC: 32.4 g/dL (ref 30.0–36.0)
MCV: 86.9 fL (ref 78.0–100.0)
MPV: 11.2 fL (ref 8.6–12.4)
PLATELETS: 282 10*3/uL (ref 150–400)
RBC: 4.73 MIL/uL (ref 4.22–5.81)
RDW: 14.6 % (ref 11.5–15.5)
WBC: 19.2 10*3/uL — AB (ref 4.0–10.5)

## 2015-02-23 LAB — BASIC METABOLIC PANEL
BUN: 21 mg/dL (ref 7–25)
CO2: 30 mmol/L (ref 20–31)
Calcium: 9.3 mg/dL (ref 8.6–10.3)
Chloride: 100 mmol/L (ref 98–110)
Creat: 0.83 mg/dL (ref 0.70–1.18)
Glucose, Bld: 193 mg/dL — ABNORMAL HIGH (ref 65–99)
Potassium: 4.6 mmol/L (ref 3.5–5.3)
Sodium: 138 mmol/L (ref 135–146)

## 2015-02-23 NOTE — Patient Instructions (Signed)
Your physician has requested that you have a cardiac catheterization MONDAY, November 21st, with Dr Gwenlyn Found. Cardiac catheterization is used to diagnose and/or treat various heart conditions. Doctors may recommend this procedure for a number of different reasons. The most common reason is to evaluate chest pain. Chest pain can be a symptom of coronary artery disease (CAD), and cardiac catheterization can show whether plaque is narrowing or blocking your heart's arteries. This procedure is also used to evaluate the valves, as well as measure the blood flow and oxygen levels in different parts of your heart. For further information please visit [email or web address]. Please follow instruction sheet, as given.  You will have to have blood work done prior to the procedure. This can be done at any [facility] lab.  Tarri Fuller, PA-C, recommends that you schedule a follow-up appointment in 2 weeks (with him or available extender). Your physician recommends that you schedule a follow-up appointment in 3 months with Dr Gwenlyn Found. You will receive a reminder letter in the mail two months in advance. If you don't receive a letter, please call our office to schedule the follow-up appointment.  If you need a refill on your cardiac medications before your next appointment, please call your pharmacy.

## 2015-02-23 NOTE — Progress Notes (Signed)
Patient ID: Robert Burns, male   DOB: Jun 21, 1940, 74 y.o.   MRN: [id number]    Date:  02/23/2015   ID:  Robert Burns, DOB 06/02/1940, MRN [id number]  PCP:  Thurman Coyer, MD  Primary Cardiologist:  Berry/Croitoru   Chief Complaint  Patient presents with  . Follow-up    post stent, pt states that he has bronchitis and that its hard to tell if he has chest pain or not, no SOB, some light headedness and dizziness, no swelling in legs     History of Present Illness: Robert Burns is a 74 y.o. male  Robert Burns is a 74 y.o. male who was seen last month for pacemaker check with Dr. Sallyanne Kuster.  He was also just hospitalized with angina and abnormal nuclear stress test. Cardiac catheterization on October 24 showed a high-grade stenosis in the proximal segment of the saphenous vein graft bypass to the PDA. He also had a subtotally occluded vein graft to the diagonal artery. He underwent placement of a drug-eluting stent in the SVG to PDA with the plan to perform staged interventional to the other SVG-diagonal lesion. 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  "Interrogation of his pacemaker which is a Medtronic Adapta device implanted in 2007 for sinus node dysfunction shows normal device function but also shows that the generator is approaching elective replacement ( voltage 2.64 folds, estimated longevity 5 months). There is 99.1% atrial pacing but only 0.8% ventricular pacing. There is no atrial activity whatsoever when pacing this temporarily suspended and there is no immediate ventricular escape rhythm. He appears to be pacemaker dependent. No episodes of mode switch or recent episodes of high ventricular rates have been detected. He most recently had a 4 second episode of high ventricular rate in August 2015."   patient is here for posthospital follow-up for scheduling of the  staged PCI. Reports catching bronchitis likely from his wife who had gone away and come back with it. He's been started on 10 days of anabolic sinus and on day about 5 or 6. He's had no nausea vomiting fever but has experienced some chills. He denies any lower extremity claudication in the left leg. He does get some pain in the upper medial thigh brachial was groin the scene notices particularly when he wakes up in the morning and then changes position.  It is a short segment where the pain occurs.   He's had some mild right-sided chest discomfort which is different from the left-sided chest pain he was having prior to his reviews intervention.  He otherwise denies chest pain, shortness of breath, orthopnea, dizziness, PND, abdominal pain, hematochezia, melena, lower extremity edema, claudication.  Wt Readings from Last 3 Encounters:  02/23/15 181 lb 14.4 oz (82.509 kg)  02/05/15 177 lb 12.8 oz (80.65 kg)  02/02/15 181 lb 3.5 oz (82.2 kg)     Past Medical History  Diagnosis Date  . Hypertension   . Leg pain   . Hyperlipidemia   . CAD (coronary artery disease)     s/p CABG  06/2002; last nuc 07/18/11 normal EF and negative for ischemia; myoview 01/21/15 abnormal, cath 02/01/15 DES to prox SVG to PDA, subtotal occlusion of SVG to Diag to be treated later.  . SSS (sick sinus syndrome) ([place])     Medtronic Adapta, 10/13/05  . 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 Cancer (HCC)     Right Shoulder, Left Leg- BCC, SCC, AND MELANOMA  . Presence of permanent cardiac pacemaker   . OSA on CPAP   . Type II diabetes mellitus ([place])   . Arthritis     "all over"  . Urgency of urination     Current Outpatient Prescriptions  Medication Sig Dispense Refill  . acetaminophen (TYLENOL) 500 MG tablet Take 500 mg by mouth every 6 (six) hours as needed for  moderate pain or headache.     . Albuterol Sulfate (PROAIR RESPICLICK) 123XX123 (90 BASE) MCG/ACT AEPB Inhale 2 puffs into the lungs 4 (four) times daily.    [name] amLODipine (NORVASC) 10 MG tablet Take 10 mg by mouth daily.      [name] aspirin EC 81 MG tablet Take 81 mg by mouth daily.      . B Complex Vitamins (B COMPLEX-B12 PO) Take 1 tablet by mouth daily.     . cefdinir (OMNICEF) 300 MG capsule Take 300 mg by mouth daily.    . cloNIDine (CATAPRES) 0.1 MG tablet Take 0.1 mg by mouth 2 (two) times daily.    . clopidogrel (PLAVIX) 75 MG tablet Take 1 tablet (75 mg total) by mouth daily with breakfast. 90 tablet 3  . Doxylamine Succinate, Sleep, (SLEEP AID PO) Take 1 tablet by mouth as needed (for sleep).     . gabapentin (NEURONTIN) 100 MG capsule Take 300 mg by mouth daily.     [name] glipiZIDE (GLUCOTROL) 5 MG tablet Take 5 mg by mouth daily.      [name] guaiFENesin-codeine (ROBITUSSIN AC) 100-10 MG/5ML syrup Take 10 mLs by mouth daily.    . imiquimod (ALDARA) 5 % cream Apply 1 application topically daily. Apply daily x 5 days then hold x 2 days then repeat.    . isosorbide mononitrate (IMDUR) 30 MG 24 hr tablet Take 1 tablet (30 mg total) by mouth daily. 30 tablet 11  . losartan-hydrochlorothiazide (HYZAAR) 100-25 MG per tablet Take 1 tablet by mouth daily.      [name] lovastatin (MEVACOR) 40 MG tablet Take 40 mg by mouth at bedtime.      . magnesium hydroxide (MILK OF MAGNESIA) 400 MG/5ML suspension Take 30 mLs by mouth as needed for moderate constipation or indigestion.     . metFORMIN (GLUCOPHAGE) 1000 MG tablet Take 1,000 mg by mouth 2 (two) times daily with a meal.      . metoprolol tartrate (LOPRESSOR) 25 MG tablet Take 25 mg by mouth 2 (two) times daily.      . Multiple Vitamin (MULTIVITAMIN) capsule Take 1 capsule by mouth daily.      . nitroGLYCERIN (NITROSTAT) 0.4 MG SL tablet Place 1 tablet (0.4 mg total) under the tongue every 5 (five) minutes as needed for chest pain. 25 tablet 3  . vitamin C (ASCORBIC  ACID) 500 MG tablet Take 500 mg by mouth 2 (two) times daily.       No current facility-administered medications for this visit.    Allergies:    Allergies  Allergen Reactions  . Peanut-Containing Drug Products Anaphylaxis and Other (See Comments)    Tongue swelling is severe  . Ace Inhibitors Other (See Comments)    Cough  . Clonidine Hcl Other (See Comments)    Patch only - skin irritation  . Coreg [Carvedilol] Itching  .  Diltiazem Hcl Other (See Comments)    Unknown  . Mobic [Meloxicam] Other (See Comments)    GI upset  . Ramipril Other (See Comments)    Unknown    Social History:  The patient  reports that he quit smoking about 41 years ago. His smoking use included Cigarettes and Pipe. He quit after 5 years of use. He has never used smokeless tobacco. He reports that he does not drink alcohol or use illicit drugs.   Family history:   Family History  Problem Relation Age of Onset  . Coronary artery disease Mother   . Heart attack Mother   . Hypertension Mother   . Heart disease Mother     Open  Heart surgery  . Diabetes Father   . Heart disease Father   . Hyperlipidemia Father   . Heart attack Father   . Hypertension Sister   . Diabetes Sister   . Heart disease Sister     ROS:  Please see the history of present illness.  All other systems reviewed and negative.   PHYSICAL EXAM: VS:  BP 130/76 mmHg  Pulse 85  Ht 5\' 8"  (1.727 m)  Wt 181 lb 14.4 oz (82.509 kg)  BMI 27.66 kg/m2 Well nourished, well developed, in no acute distress HEENT: Pupils are equal round react to light accommodation extraocular movements are intact.  Neck: no JVDNo cervical lymphadenopathy. Cardiac: Regular rate and rhythm without murmurs rubs or gallops. Lungs:  clear to auscultation bilaterally, no wheezing, rhonchi or rales Abd: soft, nontender, positive bowel sounds all quadrants, no hepatosplenomegaly Ext: no lower extremity edema.  2+ radial and  2+left posterior tibialis  pulses. Skin: warm and dry.   His right groin is nontender no ecchymosis or hematoma. Neuro:  Grossly normal     ASSESSMENT AND PLAN:  Problem List Items Addressed This Visit    PVD (peripheral vascular disease) ([place])   OSA on CPAP (Chronic)   Hyperlipidemia   Essential hypertension   CAD (coronary artery disease), Hx CABG 2004 LIMA-LAD, VG-DIAG BRANCH; seq VG -ramus & OM branch; VG-PDA  - Primary   Relevant Orders   EKG 12-Lead   CBC   Basic metabolic panel   Protime-INR   APTT   LEFT HEART CATHETERIZATION WITH CORONARY/GRAFT ANGIOGRAM    Other Visit Diagnoses    Blood clotting disorder ([place])        Relevant Orders    Protime-INR    APTT    Pre-op testing        Relevant Orders    CBC    Basic metabolic panel    Protime-INR    APTT        Coronary artery disease: Patient be scheduled for left heart catheterization with PCI to the SVG diagonal next Monday.  Appropriate precath labs will be arranged.  He is on aspirin, Plavix, Imdur 30 mg, metoprolol 25 mg twice daily.  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  Performed vascular disease. During his heart catheterization he was noted to have high-grade left external iliac disease. This was confirmed on lower extremity arterial Dopplers however. The patient is not complaining of any symptoms of claudication. He remembers when he had severe lower extremity tightness and cramping prior to his bypass and he is not having any type of symptom. He does report a short segment area in the medial upper thigh pain which is positional when he wakes up in the morning.  We'll continue to monitor.  Obstructive sleep apnea: Continue CPAP  Essential hypertension: Blood pressure controlled. No changes in current medications.  Hyperlipidemia: Continue lovastatin.

## 2015-02-24 ENCOUNTER — Other Ambulatory Visit: Payer: Self-pay

## 2015-02-24 ENCOUNTER — Telehealth: Payer: Self-pay

## 2015-02-24 DIAGNOSIS — R0789 Other chest pain: Secondary | ICD-10-CM

## 2015-02-24 DIAGNOSIS — D72829 Elevated white blood cell count, unspecified: Secondary | ICD-10-CM

## 2015-02-24 LAB — PROTIME-INR
INR: 0.99 (ref <1.50)
PROTHROMBIN TIME: 13.2 s (ref 11.6–15.2)

## 2015-02-24 LAB — APTT: aPTT: 33 seconds (ref 24–37)

## 2015-02-24 NOTE — Telephone Encounter (Signed)
Called patient about lab results. Per Tarri Fuller PA, Please have another CBC checked on Friday to make sure the WBCs are trending down. Left heart cath is scheduled for Monday. Patient verbalized understanding.

## 2015-02-26 ENCOUNTER — Telehealth: Payer: Self-pay | Admitting: Cardiovascular Disease

## 2015-02-26 DIAGNOSIS — Z79899 Other long term (current) drug therapy: Secondary | ICD-10-CM

## 2015-02-26 DIAGNOSIS — D72829 Elevated white blood cell count, unspecified: Secondary | ICD-10-CM

## 2015-02-26 LAB — CBC
HEMATOCRIT: 39.6 % (ref 39.0–52.0)
Hemoglobin: 12.9 g/dL — ABNORMAL LOW (ref 13.0–17.0)
MCH: 28.5 pg (ref 26.0–34.0)
MCHC: 32.6 g/dL (ref 30.0–36.0)
MCV: 87.6 fL (ref 78.0–100.0)
MPV: 12.2 fL (ref 8.6–12.4)
Platelets: 230 10*3/uL (ref 150–400)
RBC: 4.52 MIL/uL (ref 4.22–5.81)
RDW: 14.3 % (ref 11.5–15.5)
WBC: 15.6 10*3/uL — AB (ref 4.0–10.5)

## 2015-02-26 NOTE — Telephone Encounter (Signed)
Repeat CBC ordered per instruction, Katrina aware.

## 2015-02-26 NOTE — Telephone Encounter (Signed)
Robert Burns is calling because she needs a lab order , Robert Burns is at the lab .   Thanks

## 2015-03-01 ENCOUNTER — Encounter (HOSPITAL_COMMUNITY): Payer: Self-pay | Admitting: Cardiovascular Disease

## 2015-03-01 ENCOUNTER — Encounter (HOSPITAL_COMMUNITY)
Admission: RE | Disposition: A | Discharge status: Home / Self Care | Payer: Self-pay | Source: Ambulatory Visit | Attending: Cardiovascular Disease

## 2015-03-01 ENCOUNTER — Ambulatory Visit (HOSPITAL_COMMUNITY)
Admission: RE | Admit: 2015-03-01 | Discharge: 2015-03-02 | Disposition: A | Discharge status: Home / Self Care | Payer: Medicare HMO | Source: Ambulatory Visit | Attending: Cardiovascular Disease | Admitting: Cardiovascular Disease

## 2015-03-01 DIAGNOSIS — Z950 Presence of cardiac pacemaker: Secondary | ICD-10-CM | POA: Insufficient documentation

## 2015-03-01 DIAGNOSIS — I2583 Coronary atherosclerosis due to lipid rich plaque: Secondary | ICD-10-CM | POA: Insufficient documentation

## 2015-03-01 DIAGNOSIS — E876 Hypokalemia: Secondary | ICD-10-CM | POA: Insufficient documentation

## 2015-03-01 DIAGNOSIS — R9439 Abnormal result of other cardiovascular function study: Secondary | ICD-10-CM | POA: Insufficient documentation

## 2015-03-01 DIAGNOSIS — E119 Type 2 diabetes mellitus without complications: Secondary | ICD-10-CM | POA: Insufficient documentation

## 2015-03-01 DIAGNOSIS — Z7902 Long term (current) use of antithrombotics/antiplatelets: Secondary | ICD-10-CM | POA: Diagnosis not present

## 2015-03-01 DIAGNOSIS — I10 Essential (primary) hypertension: Secondary | ICD-10-CM | POA: Insufficient documentation

## 2015-03-01 DIAGNOSIS — R079 Chest pain, unspecified: Secondary | ICD-10-CM | POA: Insufficient documentation

## 2015-03-01 DIAGNOSIS — R0789 Other chest pain: Secondary | ICD-10-CM

## 2015-03-01 DIAGNOSIS — M1990 Unspecified osteoarthritis, unspecified site: Secondary | ICD-10-CM | POA: Diagnosis not present

## 2015-03-01 DIAGNOSIS — I2510 Atherosclerotic heart disease of native coronary artery without angina pectoris: Secondary | ICD-10-CM | POA: Diagnosis present

## 2015-03-01 DIAGNOSIS — Z955 Presence of coronary angioplasty implant and graft: Secondary | ICD-10-CM | POA: Diagnosis not present

## 2015-03-01 DIAGNOSIS — I25810 Atherosclerosis of coronary artery bypass graft(s) without angina pectoris: Secondary | ICD-10-CM | POA: Diagnosis not present

## 2015-03-01 DIAGNOSIS — Z7984 Long term (current) use of oral hypoglycemic drugs: Secondary | ICD-10-CM | POA: Diagnosis not present

## 2015-03-01 DIAGNOSIS — I739 Peripheral vascular disease, unspecified: Secondary | ICD-10-CM | POA: Diagnosis not present

## 2015-03-01 DIAGNOSIS — E785 Hyperlipidemia, unspecified: Secondary | ICD-10-CM | POA: Insufficient documentation

## 2015-03-01 DIAGNOSIS — Z8249 Family history of ischemic heart disease and other diseases of the circulatory system: Secondary | ICD-10-CM | POA: Insufficient documentation

## 2015-03-01 DIAGNOSIS — G4733 Obstructive sleep apnea (adult) (pediatric): Secondary | ICD-10-CM | POA: Insufficient documentation

## 2015-03-01 DIAGNOSIS — I25719 Atherosclerosis of autologous vein coronary artery bypass graft(s) with unspecified angina pectoris: Secondary | ICD-10-CM | POA: Diagnosis not present

## 2015-03-01 DIAGNOSIS — Z7982 Long term (current) use of aspirin: Secondary | ICD-10-CM | POA: Diagnosis not present

## 2015-03-01 DIAGNOSIS — I495 Sick sinus syndrome: Secondary | ICD-10-CM | POA: Insufficient documentation

## 2015-03-01 HISTORY — PX: CARDIAC CATHETERIZATION: SHX172

## 2015-03-01 LAB — GLUCOSE, CAPILLARY
GLUCOSE-CAPILLARY: 160 mg/dL — AB (ref 65–99)
Glucose-Capillary: 145 mg/dL — ABNORMAL HIGH (ref 65–99)
Glucose-Capillary: 150 mg/dL — ABNORMAL HIGH (ref 65–99)
Glucose-Capillary: 213 mg/dL — ABNORMAL HIGH (ref 65–99)

## 2015-03-01 LAB — POCT ACTIVATED CLOTTING TIME: Activated Clotting Time: 442 seconds

## 2015-03-01 SURGERY — CORONARY STENT INTERVENTION

## 2015-03-01 MED ORDER — IOHEXOL 350 MG/ML SOLN
INTRAVENOUS | Status: DC | PRN
  Administered 2015-03-01: 90 mL via INTRAVENOUS

## 2015-03-01 MED ORDER — SODIUM CHLORIDE 0.9 % IJ SOLN
3.0000 mL | INTRAMUSCULAR | Status: DC | PRN
Start: 2015-03-01 — End: 2015-03-02

## 2015-03-01 MED ORDER — LOSARTAN POTASSIUM 50 MG PO TABS
100.0000 mg | ORAL_TABLET | Freq: Every day | ORAL | Status: DC
  Administered 2015-03-02: 06:00:00 100 mg via ORAL
  Filled 2015-03-01 (×2): qty 2

## 2015-03-01 MED ORDER — MIDAZOLAM HCL 2 MG/2ML IJ SOLN
INTRAMUSCULAR | Status: AC
  Filled 2015-03-01: qty 2

## 2015-03-01 MED ORDER — GUAIFENESIN-CODEINE 100-10 MG/5ML PO SYRP: 10.0000 mL | ORAL_SOLUTION | Freq: Every day | ORAL | Status: DC

## 2015-03-01 MED ORDER — AMLODIPINE BESYLATE 10 MG PO TABS
10.0000 mg | ORAL_TABLET | Freq: Every day | ORAL | Status: DC
  Administered 2015-03-02: 10:00:00 10 mg via ORAL
  Filled 2015-03-01: qty 1

## 2015-03-01 MED ORDER — MAGNESIUM HYDROXIDE 400 MG/5ML PO SUSP
30.0000 mL | ORAL | Status: DC | PRN
Start: 2015-03-01 — End: 2015-03-02

## 2015-03-01 MED ORDER — HEPARIN (PORCINE) IN NACL 2-0.9 UNIT/ML-% IJ SOLN
INTRAMUSCULAR | Status: AC
  Filled 2015-03-01: qty 1500

## 2015-03-01 MED ORDER — NITROGLYCERIN 1 MG/10 ML FOR IR/CATH LAB
INTRA_ARTERIAL | Status: DC | PRN
  Administered 2015-03-01: 09:00:00

## 2015-03-01 MED ORDER — HYDROCHLOROTHIAZIDE 25 MG PO TABS
25.0000 mg | ORAL_TABLET | Freq: Every day | ORAL | Status: DC
  Administered 2015-03-02: 25 mg via ORAL
  Filled 2015-03-01: qty 1

## 2015-03-01 MED ORDER — CLOPIDOGREL BISULFATE 75 MG PO TABS
75.0000 mg | ORAL_TABLET | Freq: Every day | ORAL | Status: DC
  Administered 2015-03-02: 10:00:00 75 mg via ORAL
  Filled 2015-03-01: qty 1

## 2015-03-01 MED ORDER — ISOSORBIDE MONONITRATE ER 30 MG PO TB24
30.0000 mg | ORAL_TABLET | Freq: Every day | ORAL | Status: DC
  Administered 2015-03-02: 30 mg via ORAL
  Filled 2015-03-01: qty 1

## 2015-03-01 MED ORDER — METFORMIN HCL 500 MG PO TABS: 1000.0000 mg | ORAL_TABLET | Freq: Two times a day (BID) | ORAL | Status: DC

## 2015-03-01 MED ORDER — GLIPIZIDE 5 MG PO TABS
5.0000 mg | ORAL_TABLET | Freq: Every day | ORAL | Status: DC
  Administered 2015-03-01 – 2015-03-02 (×2): 5 mg via ORAL
  Filled 2015-03-01 (×2): qty 1

## 2015-03-01 MED ORDER — ASPIRIN 81 MG PO CHEW
81.0000 mg | CHEWABLE_TABLET | Freq: Every day | ORAL | Status: DC
  Administered 2015-03-01: 81 mg via ORAL
  Filled 2015-03-01: qty 1

## 2015-03-01 MED ORDER — ALBUTEROL SULFATE (2.5 MG/3ML) 0.083% IN NEBU
3.0000 mL | INHALATION_SOLUTION | Freq: Four times a day (QID) | RESPIRATORY_TRACT | Status: DC
  Filled 2015-03-01: qty 3

## 2015-03-01 MED ORDER — ONDANSETRON HCL 4 MG/2ML IJ SOLN: 4.0000 mg | Freq: Four times a day (QID) | INTRAMUSCULAR | Status: DC | PRN

## 2015-03-01 MED ORDER — ACETAMINOPHEN 325 MG PO TABS: 650.0000 mg | ORAL_TABLET | ORAL | Status: DC | PRN

## 2015-03-01 MED ORDER — ATROPINE SULFATE 0.1 MG/ML IJ SOLN
INTRAMUSCULAR | Status: AC
  Filled 2015-03-01: qty 10

## 2015-03-01 MED ORDER — ADULT MULTIVITAMIN W/MINERALS CH
1.0000 | ORAL_TABLET | Freq: Every day | ORAL | Status: DC
  Administered 2015-03-02: 10:00:00 1 via ORAL
  Filled 2015-03-01: qty 1

## 2015-03-01 MED ORDER — SODIUM CHLORIDE 0.9 % IJ SOLN
3.0000 mL | Freq: Two times a day (BID) | INTRAMUSCULAR | Status: DC
  Administered 2015-03-01 – 2015-03-02 (×3): 3 mL via INTRAVENOUS

## 2015-03-01 MED ORDER — ASPIRIN EC 81 MG PO TBEC
81.0000 mg | DELAYED_RELEASE_TABLET | Freq: Every day | ORAL | Status: DC
  Administered 2015-03-02: 81 mg via ORAL
  Filled 2015-03-01: qty 1

## 2015-03-01 MED ORDER — LIDOCAINE HCL (PF) 1 % IJ SOLN
INTRAMUSCULAR | Status: AC
  Filled 2015-03-01: qty 30

## 2015-03-01 MED ORDER — SODIUM CHLORIDE 0.9 % IV SOLN
250.0000 mL | INTRAVENOUS | Status: DC | PRN
Start: 2015-03-01 — End: 2015-03-02

## 2015-03-01 MED ORDER — CLONIDINE HCL 0.1 MG PO TABS
0.1000 mg | ORAL_TABLET | Freq: Two times a day (BID) | ORAL | Status: DC
  Administered 2015-03-01 – 2015-03-02 (×2): 0.1 mg via ORAL
  Filled 2015-03-01 (×3): qty 1

## 2015-03-01 MED ORDER — MORPHINE SULFATE (PF) 2 MG/ML IV SOLN: 2.0000 mg | INTRAVENOUS | Status: DC | PRN

## 2015-03-01 MED ORDER — SODIUM CHLORIDE 0.9 % WEIGHT BASED INFUSION: 1.0000 mL/kg/h | INTRAVENOUS | Status: DC

## 2015-03-01 MED ORDER — FENTANYL CITRATE (PF) 100 MCG/2ML IJ SOLN
INTRAMUSCULAR | Status: AC
  Filled 2015-03-01: qty 2

## 2015-03-01 MED ORDER — SODIUM CHLORIDE 0.9 % WEIGHT BASED INFUSION
3.0000 mL/kg/h | INTRAVENOUS | Status: DC
  Administered 2015-03-01: 3 mL/kg/h via INTRAVENOUS

## 2015-03-01 MED ORDER — ANGIOPLASTY BOOK
Freq: Once | Status: AC
  Administered 2015-03-01: 22:00:00
  Filled 2015-03-01: qty 1

## 2015-03-01 MED ORDER — NITROGLYCERIN 1 MG/10 ML FOR IR/CATH LAB
INTRA_ARTERIAL | Status: AC
  Filled 2015-03-01: qty 10

## 2015-03-01 MED ORDER — ACETAMINOPHEN 500 MG PO TABS: 500.0000 mg | ORAL_TABLET | Freq: Four times a day (QID) | ORAL | Status: DC | PRN

## 2015-03-01 MED ORDER — NITROGLYCERIN 0.4 MG SL SUBL
0.4000 mg | SUBLINGUAL_TABLET | SUBLINGUAL | Status: DC | PRN
Start: 2015-03-01 — End: 2015-03-02

## 2015-03-01 MED ORDER — HYDRALAZINE HCL 20 MG/ML IJ SOLN
INTRAMUSCULAR | Status: DC | PRN
Start: 2015-03-01 — End: 2015-03-01
  Administered 2015-03-01: 10 mg via INTRAVENOUS

## 2015-03-01 MED ORDER — LOSARTAN POTASSIUM-HCTZ 100-25 MG PO TABS: 1.0000 | ORAL_TABLET | Freq: Every day | ORAL | Status: DC

## 2015-03-01 MED ORDER — VITAMIN C 500 MG PO TABS
500.0000 mg | ORAL_TABLET | Freq: Two times a day (BID) | ORAL | Status: DC
  Administered 2015-03-01 – 2015-03-02 (×2): 500 mg via ORAL
  Filled 2015-03-01 (×2): qty 1

## 2015-03-01 MED ORDER — SODIUM CHLORIDE 0.9 % IV SOLN
250.0000 mg | INTRAVENOUS | Status: DC | PRN
  Administered 2015-03-01: 1.75 mg/kg/h via INTRAVENOUS

## 2015-03-01 MED ORDER — BIVALIRUDIN BOLUS VIA INFUSION - CUPID
INTRAVENOUS | Status: DC | PRN
  Administered 2015-03-01: 59.175 mg via INTRAVENOUS

## 2015-03-01 MED ORDER — GABAPENTIN 300 MG PO CAPS
300.0000 mg | ORAL_CAPSULE | Freq: Every day | ORAL | Status: DC
  Administered 2015-03-02: 10:00:00 300 mg via ORAL
  Filled 2015-03-01 (×2): qty 1

## 2015-03-01 MED ORDER — HYDRALAZINE HCL 20 MG/ML IJ SOLN
10.0000 mg | INTRAMUSCULAR | Status: DC | PRN
  Administered 2015-03-01 – 2015-03-02 (×2): 10 mg via INTRAVENOUS
  Filled 2015-03-01 (×3): qty 1

## 2015-03-01 MED ORDER — GUAIFENESIN-CODEINE 100-10 MG/5ML PO SOLN: 10.0000 mL | Freq: Every day | ORAL | Status: DC

## 2015-03-01 MED ORDER — IMIQUIMOD 5 % EX CREA: 1.0000 "application " | TOPICAL_CREAM | Freq: Every day | CUTANEOUS | Status: DC

## 2015-03-01 MED ORDER — MULTIVITAMINS PO CAPS: 1.0000 | ORAL_CAPSULE | Freq: Every day | ORAL | Status: DC

## 2015-03-01 MED ORDER — FENTANYL CITRATE (PF) 100 MCG/2ML IJ SOLN
INTRAMUSCULAR | Status: DC | PRN
  Administered 2015-03-01: 25 ug via INTRAVENOUS

## 2015-03-01 MED ORDER — LIDOCAINE HCL (PF) 1 % IJ SOLN
INTRAMUSCULAR | Status: DC | PRN
  Administered 2015-03-01: 30 mL

## 2015-03-01 MED ORDER — MIDAZOLAM HCL 2 MG/2ML IJ SOLN
INTRAMUSCULAR | Status: DC | PRN
Start: 2015-03-01 — End: 2015-03-01
  Administered 2015-03-01: 1 mg via INTRAVENOUS

## 2015-03-01 MED ORDER — HYDRALAZINE HCL 20 MG/ML IJ SOLN
INTRAMUSCULAR | Status: AC
  Filled 2015-03-01: qty 1

## 2015-03-01 MED ORDER — BIVALIRUDIN 250 MG IV SOLR
INTRAVENOUS | Status: AC
  Filled 2015-03-01: qty 250

## 2015-03-01 MED ORDER — SODIUM CHLORIDE 0.9 % WEIGHT BASED INFUSION: 3.0000 mL/kg/h | INTRAVENOUS | Status: AC

## 2015-03-01 MED ORDER — CLOPIDOGREL BISULFATE 75 MG PO TABS: 75.0000 mg | ORAL_TABLET | Freq: Every day | ORAL | Status: DC

## 2015-03-01 MED ORDER — SODIUM CHLORIDE 0.9 % IJ SOLN: 3.0000 mL | INTRAMUSCULAR | Status: DC | PRN

## 2015-03-01 SURGICAL SUPPLY — 14 items
BALLN EMERGE MR 2.0X12 (BALLOONS) ×2
BALLOON EMERGE MR 2.0X12 (BALLOONS) ×1 IMPLANT
GUIDE CATH RUNWAY 6FR AL 1 (CATHETERS) ×2 IMPLANT
GUIDE CATH RUNWAY 6FR LCB (CATHETERS) ×2 IMPLANT
KIT ENCORE 26 ADVANTAGE (KITS) ×2 IMPLANT
KIT HEART LEFT (KITS) ×2 IMPLANT
PACK CARDIAC CATHETERIZATION (CUSTOM PROCEDURE TRAY) ×2 IMPLANT
SHEATH PINNACLE 6F 10CM (SHEATH) ×2 IMPLANT
STENT SYNERGY DES 2.25X16 (Permanent Stent) ×2 IMPLANT
STENT SYNERGY DES 2.5X24 (Permanent Stent) ×2 IMPLANT
TRANSDUCER W/STOPCOCK (MISCELLANEOUS) ×2 IMPLANT
TUBING CIL FLEX 10 FLL-RA (TUBING) ×2 IMPLANT
WIRE ASAHI PROWATER 180CM (WIRE) ×2 IMPLANT
WIRE EMERALD 3MM-J .035X150CM (WIRE) ×2 IMPLANT

## 2015-03-01 NOTE — Interval H&P Note (Signed)
Cath Lab Visit (complete for each Cath Lab visit)  Clinical Evaluation Leading to the Procedure:   ACS: No.  Non-ACS:    Anginal Classification: CCS III  Anti-ischemic medical therapy: Maximal Therapy (2 or more classes of medications)  Non-Invasive Test Results: Intermediate-risk stress test findings: cardiac mortality 1-3%/year  Prior CABG: Previous CABG      History and Physical Interval Note:  03/01/2015 8:33 AM  Leonie Man Kauth  has presented today for surgery, with the diagnosis of cp  The various methods of treatment have been discussed with the patient and family. After consideration of risks, benefits and other options for treatment, the patient has consented to  Procedure(s): Coronary Stent Intervention (N/A) as a surgical intervention .  The patient's history has been reviewed, patient examined, no change in status, stable for surgery.  I have reviewed the patient's chart and labs.  Questions were answered to the patient's satisfaction.     Quay Burow

## 2015-03-01 NOTE — Progress Notes (Signed)
Site area: right groin  Site Prior to Removal:  Level 0  Pressure Applied For 20 MINUTES    Minutes Beginning at 1750  Manual:   Yes.    Patient Status During Pull:  AAO x4  Post Pull Groin Site:  Level 0  Post Pull Instructions Given:  Yes.    Post Pull Pulses Present:  Yes.    Dressing Applied:  Yes.    Comments:  Patient's sb/p dropped to 70's - 80's mmhg, A paced rate of  60 per CM,  with frequent yawning noted . Pt denies  pain nor any discomforts. IV  bolus given, b/p monitored and stabilized  sb/p >100 post procedure ,see v/s flow sheet.

## 2015-03-01 NOTE — H&P (View-Only) (Signed)
Patient ID: Robert Burns, male   DOB: 11-19-1940, 74 y.o.   MRN: [id number]    Date:  02/23/2015   ID:  Robert Burns, DOB [birth year], MRN [id number]  PCP:  Thurman Coyer, MD  Primary Cardiologist:  Berry/Croitoru   Chief Complaint  Patient presents with  . Follow-up    post stent, pt states that he has bronchitis and that its hard to tell if he has chest pain or not, no SOB, some light headedness and dizziness, no swelling in legs     History of Present Illness: [name] is a 74 y.o. male  [name] is a 74 y.o. male who was seen last month for pacemaker check with Dr. Sallyanne Kuster.  He was also just hospitalized with angina and abnormal nuclear stress test. Cardiac catheterization on October 24 showed a high-grade stenosis in the proximal segment of the saphenous vein graft bypass to the PDA. He also had a subtotally occluded vein graft to the diagonal artery. He underwent placement of a drug-eluting stent in the SVG to PDA with the plan to perform staged interventional to the other SVG-diagonal lesion. 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  "Interrogation of his pacemaker which is a Medtronic Adapta device implanted in 2007 for sinus node dysfunction shows normal device function but also shows that the generator is approaching elective replacement ( voltage 2.64 folds, estimated longevity 5 months). There is 99.1% atrial pacing but only 0.8% ventricular pacing. There is no atrial activity whatsoever when pacing this temporarily suspended and there is no immediate ventricular escape rhythm. He appears to be pacemaker dependent. No episodes of mode switch or recent episodes of high ventricular rates have been detected. He most recently had a 4 second episode of high ventricular rate in August 2015."   patient is here for posthospital follow-up for scheduling of the  staged PCI. Reports catching bronchitis likely from his wife who had gone away and come back with it. He's been started on 10 days of anabolic sinus and on day about 5 or 6. He's had no nausea vomiting fever but has experienced some chills. He denies any lower extremity claudication in the left leg. He does get some pain in the upper medial thigh brachial was groin the scene notices particularly when he wakes up in the morning and then changes position.  It is a short segment where the pain occurs.   He's had some mild right-sided chest discomfort which is different from the left-sided chest pain he was having prior to his reviews intervention.  He otherwise denies chest pain, shortness of breath, orthopnea, dizziness, PND, abdominal pain, hematochezia, melena, lower extremity edema, claudication.  Wt Readings from Last 3 Encounters:  02/23/15 181 lb 14.4 oz (82.509 kg)  02/05/15 177 lb 12.8 oz (80.65 kg)  02/02/15 181 lb 3.5 oz (82.2 kg)     Past Medical History  Diagnosis Date  . Hypertension   . Leg pain   . Hyperlipidemia   . CAD (coronary artery disease)     s/p CABG  06/2002; last nuc 07/18/11 normal EF and negative for ischemia; myoview 01/21/15 abnormal, cath 02/01/15 DES to prox SVG to PDA, subtotal occlusion of SVG to Diag to be treated later.  . SSS (sick sinus syndrome) ([place])     Medtronic Adapta, 10/13/05  . 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 Cancer (HCC)     Right Shoulder, Left Leg- BCC, SCC, AND MELANOMA  . Presence of permanent cardiac pacemaker   . OSA on CPAP   . Type II diabetes mellitus ([place])   . Arthritis     "all over"  . Urgency of urination     Current Outpatient Prescriptions  Medication Sig Dispense Refill  . acetaminophen (TYLENOL) 500 MG tablet Take 500 mg by mouth every 6 (six) hours as needed for  moderate pain or headache.     . Albuterol Sulfate (PROAIR RESPICLICK) 123XX123 (90 BASE) MCG/ACT AEPB Inhale 2 puffs into the lungs 4 (four) times daily.    [name] amLODipine (NORVASC) 10 MG tablet Take 10 mg by mouth daily.      [name] aspirin EC 81 MG tablet Take 81 mg by mouth daily.      . B Complex Vitamins (B COMPLEX-B12 PO) Take 1 tablet by mouth daily.     . cefdinir (OMNICEF) 300 MG capsule Take 300 mg by mouth daily.    . cloNIDine (CATAPRES) 0.1 MG tablet Take 0.1 mg by mouth 2 (two) times daily.    . clopidogrel (PLAVIX) 75 MG tablet Take 1 tablet (75 mg total) by mouth daily with breakfast. 90 tablet 3  . Doxylamine Succinate, Sleep, (SLEEP AID PO) Take 1 tablet by mouth as needed (for sleep).     . gabapentin (NEURONTIN) 100 MG capsule Take 300 mg by mouth daily.     [name] glipiZIDE (GLUCOTROL) 5 MG tablet Take 5 mg by mouth daily.      [name] guaiFENesin-codeine (ROBITUSSIN AC) 100-10 MG/5ML syrup Take 10 mLs by mouth daily.    . imiquimod (ALDARA) 5 % cream Apply 1 application topically daily. Apply daily x 5 days then hold x 2 days then repeat.    . isosorbide mononitrate (IMDUR) 30 MG 24 hr tablet Take 1 tablet (30 mg total) by mouth daily. 30 tablet 11  . losartan-hydrochlorothiazide (HYZAAR) 100-25 MG per tablet Take 1 tablet by mouth daily.      [name] lovastatin (MEVACOR) 40 MG tablet Take 40 mg by mouth at bedtime.      . magnesium hydroxide (MILK OF MAGNESIA) 400 MG/5ML suspension Take 30 mLs by mouth as needed for moderate constipation or indigestion.     . metFORMIN (GLUCOPHAGE) 1000 MG tablet Take 1,000 mg by mouth 2 (two) times daily with a meal.      . metoprolol tartrate (LOPRESSOR) 25 MG tablet Take 25 mg by mouth 2 (two) times daily.      . Multiple Vitamin (MULTIVITAMIN) capsule Take 1 capsule by mouth daily.      . nitroGLYCERIN (NITROSTAT) 0.4 MG SL tablet Place 1 tablet (0.4 mg total) under the tongue every 5 (five) minutes as needed for chest pain. 25 tablet 3  . vitamin C (ASCORBIC  ACID) 500 MG tablet Take 500 mg by mouth 2 (two) times daily.       No current facility-administered medications for this visit.    Allergies:    Allergies  Allergen Reactions  . Peanut-Containing Drug Products Anaphylaxis and Other (See Comments)    Tongue swelling is severe  . Ace Inhibitors Other (See Comments)    Cough  . Clonidine Hcl Other (See Comments)    Patch only - skin irritation  . Coreg [Carvedilol] Itching  .  Diltiazem Hcl Other (See Comments)    Unknown  . Mobic [Meloxicam] Other (See Comments)    GI upset  . Ramipril Other (See Comments)    Unknown    Social History:  The patient  reports that he quit smoking about 41 years ago. His smoking use included Cigarettes and Pipe. He quit after 5 years of use. He has never used smokeless tobacco. He reports that he does not drink alcohol or use illicit drugs.   Family history:   Family History  Problem Relation Age of Onset  . Coronary artery disease Mother   . Heart attack Mother   . Hypertension Mother   . Heart disease Mother     Open  Heart surgery  . Diabetes Father   . Heart disease Father   . Hyperlipidemia Father   . Heart attack Father   . Hypertension Sister   . Diabetes Sister   . Heart disease Sister     ROS:  Please see the history of present illness.  All other systems reviewed and negative.   PHYSICAL EXAM: VS:  BP 130/76 mmHg  Pulse 85  Ht 5\' 8"  (1.727 m)  Wt 181 lb 14.4 oz (82.509 kg)  BMI 27.66 kg/m2 Well nourished, well developed, in no acute distress HEENT: Pupils are equal round react to light accommodation extraocular movements are intact.  Neck: no JVDNo cervical lymphadenopathy. Cardiac: Regular rate and rhythm without murmurs rubs or gallops. Lungs:  clear to auscultation bilaterally, no wheezing, rhonchi or rales Abd: soft, nontender, positive bowel sounds all quadrants, no hepatosplenomegaly Ext: no lower extremity edema.  2+ radial and  2+left posterior tibialis  pulses. Skin: warm and dry.   His right groin is nontender no ecchymosis or hematoma. Neuro:  Grossly normal     ASSESSMENT AND PLAN:  Problem List Items Addressed This Visit    PVD (peripheral vascular disease) ([place])   OSA on CPAP (Chronic)   Hyperlipidemia   Essential hypertension   CAD (coronary artery disease), Hx CABG 2004 LIMA-LAD, VG-DIAG BRANCH; seq VG -ramus & OM branch; VG-PDA  - Primary   Relevant Orders   EKG 12-Lead   CBC   Basic metabolic panel   Protime-INR   APTT   LEFT HEART CATHETERIZATION WITH CORONARY/GRAFT ANGIOGRAM    Other Visit Diagnoses    Blood clotting disorder ([place])        Relevant Orders    Protime-INR    APTT    Pre-op testing        Relevant Orders    CBC    Basic metabolic panel    Protime-INR    APTT        Coronary artery disease: Patient be scheduled for left heart catheterization with PCI to the SVG diagonal next Monday.  Appropriate precath labs will be arranged.  He is on aspirin, Plavix, Imdur 30 mg, metoprolol 25 mg twice daily.  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  Performed vascular disease. During his heart catheterization he was noted to have high-grade left external iliac disease. This was confirmed on lower extremity arterial Dopplers however. The patient is not complaining of any symptoms of claudication. He remembers when he had severe lower extremity tightness and cramping prior to his bypass and he is not having any type of symptom. He does report a short segment area in the medial upper thigh pain which is positional when he wakes up in the morning.  We'll continue to monitor.  Obstructive sleep apnea: Continue CPAP  Essential hypertension: Blood pressure controlled. No changes in current medications.  Hyperlipidemia: Continue lovastatin.

## 2015-03-02 DIAGNOSIS — R079 Chest pain, unspecified: Secondary | ICD-10-CM | POA: Diagnosis not present

## 2015-03-02 DIAGNOSIS — I25719 Atherosclerosis of autologous vein coronary artery bypass graft(s) with unspecified angina pectoris: Secondary | ICD-10-CM | POA: Diagnosis not present

## 2015-03-02 DIAGNOSIS — Z7982 Long term (current) use of aspirin: Secondary | ICD-10-CM | POA: Diagnosis not present

## 2015-03-02 DIAGNOSIS — I2583 Coronary atherosclerosis due to lipid rich plaque: Secondary | ICD-10-CM | POA: Diagnosis not present

## 2015-03-02 DIAGNOSIS — I25111 Atherosclerotic heart disease of native coronary artery with angina pectoris with documented spasm: Secondary | ICD-10-CM

## 2015-03-02 LAB — CBC
HEMATOCRIT: 38.4 % — AB (ref 39.0–52.0)
Hemoglobin: 12.7 g/dL — ABNORMAL LOW (ref 13.0–17.0)
MCH: 28.6 pg (ref 26.0–34.0)
MCHC: 33.1 g/dL (ref 30.0–36.0)
MCV: 86.5 fL (ref 78.0–100.0)
PLATELETS: 182 10*3/uL (ref 150–400)
RBC: 4.44 MIL/uL (ref 4.22–5.81)
RDW: 14.9 % (ref 11.5–15.5)
WBC: 15.3 10*3/uL — AB (ref 4.0–10.5)

## 2015-03-02 LAB — BASIC METABOLIC PANEL
Anion gap: 11 (ref 5–15)
BUN: 14 mg/dL (ref 6–20)
CALCIUM: 9.4 mg/dL (ref 8.9–10.3)
CO2: 24 mmol/L (ref 22–32)
CREATININE: 0.85 mg/dL (ref 0.61–1.24)
Chloride: 107 mmol/L (ref 101–111)
GFR calc Af Amer: >60 mL/min (ref >60)
GLUCOSE: 121 mg/dL — AB (ref 65–99)
Potassium: 3.3 mmol/L — ABNORMAL LOW (ref 3.5–5.1)
Sodium: 142 mmol/L (ref 135–145)

## 2015-03-02 LAB — GLUCOSE, CAPILLARY: GLUCOSE-CAPILLARY: 125 mg/dL — AB (ref 65–99)

## 2015-03-02 MED ORDER — POTASSIUM CHLORIDE CRYS ER 20 MEQ PO TBCR
40.0000 meq | EXTENDED_RELEASE_TABLET | Freq: Once | ORAL | Status: AC
  Administered 2015-03-02: 40 meq via ORAL
  Filled 2015-03-02: qty 2

## 2015-03-02 MED ORDER — METOPROLOL TARTRATE 25 MG PO TABS
25.0000 mg | ORAL_TABLET | Freq: Two times a day (BID) | ORAL | Status: DC
  Administered 2015-03-02: 10:00:00 25 mg via ORAL
  Filled 2015-03-02: qty 1

## 2015-03-02 NOTE — Discharge Summary (Signed)
Discharge Summary   Patient ID: Robert Burns,  MRN: [id number], DOB/AGE: 1940-07-17 74 y.o.  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 Peanut-Containing Drug Products Anaphylaxis and Other (See Comments)    Tongue swelling is severe  . Ace Inhibitors Other (See Comments)    Cough  . Clonidine Hcl Other (See Comments)    Patch only - skin irritation  . Coreg [Carvedilol] Itching  . Diltiazem Hcl Other (See Comments)    Unknown  . Mobic [Meloxicam] Other (See Comments)    GI upset  . Ramipril Other (See Comments)    Unknown    Consultant: None  Procedures  Coronary Stent Intervention 03/01/15    Conclusion     Prox Graft lesion, 90% stenosed.  Origin to Prox Graft lesion, 99% stenosed. Post intervention, there is a 0% residual stenosis.  Dist Graft lesion, 95% stenosed. Post intervention, there is a 0% residual stenosis.   IMPRESSION:successful PCI and stenting of the diagonal branch being graft using drug-eluting stent of the origin/proximal subtotally occluded segment. The patient remained hemodynamically and electrocardiographically stable throughout the procedure. The guidewire and catheter were removed. The sheath was secured. Bivalirudin was discontinued. The patient left the lab in stable condition. The sheath will be removed and pressure held. The patient will be hydrated, kept overnight and discharged home in the morning. I will see him back in 2-3 weeks. He left the cath lab in stable  condition.   History of Present Illness  The patient is a 74 year old Caucasian male with past medical history of PAD, SSS s/p MDT PPM 02/2007 by Dr. Sallyanne Kuster, OSA on CPAP, HTN, HLD and CAD s/p CABG 2004 LIMA to LAD, SVG to Diag, sequential SVG to ramus and OM, SVG to PDA. He had left lower extremity placement of interposition 6-mm Gore-Tex graft by Dr. Kellie Simmering. He was seen in the clinic on 01/12/2015, at which time he noticed anginal chest discomfort occurring both at rest and with exertion with left upper extremity radiation. After discussing with the patient, he agreed to obtain outpatient pharmacologic Myoview stress test to risk stratify him and rule out ischemic etiology. Stress test performed on 01/21/2015 showed EF 43%, small defect of mild severity present in mid anterolateral location, finding consistent with ischemia, intermediate risk study. Given the abnormal Myoview result, patient agreed to undergo cardiac catheterization.  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 Postprocedure, he was placed on aspirin and Plavix. The plan is for possible staged PCI of SVG to diag later.  He was seen 03/10/15 for pacemaker check with Dr. Sallyanne Kuster.  nterrogation of his pacemaker which is a Medtronic Adapta device implanted in 2007 for sinus node dysfunction shows normal device function but also shows that the generator is approaching elective replacement ( voltage 2.64 folds, estimated longevity 5 months). There  is 99.1% atrial pacing but only 0.8% ventricular pacing. There is no atrial activity whatsoever when pacing this temporarily suspended and there is no immediate ventricular escape rhythm. He appears to be pacemaker dependent.  The patient seen in clinic 11/15 for post hospital follow- up for scheduling of the staged PCI. Reported catching bronchitis  likely from his wife who had gone away and come back with it. He's been started on 10 days of anabolic sinus and on day about 5 or 6.   Hospital Course  The patient presented for schedule staged PCI. s/p successful PCI and stenting of the diagonal branch being graft using drug-eluting stent of the origin/proximal subtotally occluded segment. Post cath patient's BP elevated - resume home meds and given IV hydralazine. Supplement given for low potassium. Noted leukocytes (wbc of 15), however improved from pre cath labs (WBC of 19.2  On 02/23/15). No fever. Felt likely from bronchitis.   She has been seen by Dr. Tamala Julian today and deemed ready for discharge home. All follow-up appointments have been scheduled. Discharge medications are listed below.   During his heart catheterization he was noted to have high-grade left external iliac disease. This was confirmed on lower extremity arterial Dopplers however. Will need to follow-up with Dr. Gwenlyn Found to arrange left lower extremity PV procedure soon.  Discharge Vitals Blood pressure 176/54, pulse 81, temperature 98 F (36.7 C), temperature source Oral, resp. rate 16, height 5\' 8"  (1.727 m), weight 174 lb 6.1 oz (79.1 kg), SpO2 97 %.  Filed Weights   03/01/15 0711 03/02/15 0356  Weight: 174 lb (78.926 kg) 174 lb 6.1 oz (79.1 kg)    Labs  CBC  Recent Labs  03/02/15 0622  WBC 15.3*  HGB 12.7*  HCT 38.4*  MCV 86.5  PLT Q000111Q   Basic Metabolic Panel  Recent Labs  03/02/15 0622  NA 142  K 3.3*  CL 107  CO2 24  GLUCOSE 121*  BUN 14  CREATININE 0.85  CALCIUM 9.4    Disposition  Pt is being discharged home today in good condition.  Follow-up Plans & Appointments  Follow-up Information    Follow up with Erlene Quan, PA-C On 03/15/2015.   Specialties:  Cardiology, Radiology   Why:  @ 2:30 pm for post hosptial    Contact information:   [place] [address] [phone]           Discharge  Instructions    AMB Referral to Cardiac Rehabilitation - Phase II    Complete by:  As directed   Diagnosis:  PCI     Amb Referral to Cardiac Rehabilitation    Complete by:  As directed   Diagnosis:  PCI     Diet - low sodium heart healthy    Complete by:  As directed      Discharge instructions    Complete by:  As directed   No driving for 48 hours. No lifting over 5 lbs for 1 week. No sexual activity for 1 week. You may return to work on 03/08/15. Keep procedure site clean & dry. If you notice increased pain, swelling, bleeding or pus, call/return!  You may shower, but no soaking baths/hot tubs/pools for 1 week.   Hold metformin until 03/03/15. Resume 03/04/15.     Increase activity slowly    Complete by:  As directed          F/u Labs/Studies: None  Discharge Medications    Medication List  TAKE these medications        acetaminophen 500 MG tablet  Commonly known as:  TYLENOL  Take 500 mg by mouth every 6 (six) hours as needed for moderate pain or headache.     amLODipine 10 MG tablet  Commonly known as:  NORVASC  Take 10 mg by mouth daily.     aspirin EC 81 MG tablet  Take 81 mg by mouth daily.     B COMPLEX-B12 PO  Take 1 tablet by mouth daily.     cloNIDine 0.1 MG tablet  Commonly known as:  CATAPRES  Take 0.1 mg by mouth 2 (two) times daily.     clopidogrel 75 MG tablet  Commonly known as:  PLAVIX  Take 1 tablet (75 mg total) by mouth daily with breakfast.     gabapentin 100 MG capsule  Commonly known as:  NEURONTIN  Take 300 mg by mouth daily.     glipiZIDE 5 MG tablet  Commonly known as:  GLUCOTROL  Take 5 mg by mouth daily.     guaiFENesin-codeine 100-10 MG/5ML syrup  Commonly known as:  ROBITUSSIN AC  Take 10 mLs by mouth daily.     imiquimod 5 % cream  Commonly known as:  ALDARA  Apply 1 application topically daily. Apply daily x 5 days then hold x 2 days then repeat.     isosorbide mononitrate 30 MG 24 hr tablet  Commonly known as:   IMDUR  Take 1 tablet (30 mg total) by mouth daily.     losartan-hydrochlorothiazide 100-25 MG tablet  Commonly known as:  HYZAAR  Take 1 tablet by mouth daily.     lovastatin 40 MG tablet  Commonly known as:  MEVACOR  Take 40 mg by mouth at bedtime.     magnesium hydroxide 400 MG/5ML suspension  Commonly known as:  MILK OF MAGNESIA  Take 30 mLs by mouth as needed for moderate constipation or indigestion.     metFORMIN 1000 MG tablet  Commonly known as:  GLUCOPHAGE  Take 1,000 mg by mouth 2 (two) times daily with a meal.     metoprolol tartrate 25 MG tablet  Commonly known as:  LOPRESSOR  Take 25 mg by mouth 2 (two) times daily.     multivitamin capsule  Take 1 capsule by mouth daily.     nitroGLYCERIN 0.4 MG SL tablet  Commonly known as:  NITROSTAT  Place 1 tablet (0.4 mg total) under the tongue every 5 (five) minutes as needed for chest pain.     PROAIR RESPICLICK 123XX123 (90 BASE) MCG/ACT Aepb  Generic drug:  Albuterol Sulfate  Inhale 2 puffs into the lungs 4 (four) times daily.     SLEEP AID PO  Take 1 tablet by mouth as needed (for sleep).     vitamin C 500 MG tablet  Commonly known as:  ASCORBIC ACID  Take 500 mg by mouth 2 (two) times daily.        Duration of Discharge Encounter   Greater than 30 minutes including physician time.  Signed, Elya Diloreto PA-C 03/02/2015, 11:11 AM

## 2015-03-02 NOTE — Progress Notes (Signed)
CARDIAC REHAB PHASE I   PRE:  Rate/Rhythm: 85 paced  BP:  Sitting: 195/49        SaO2: 97 RA  MODE:  Ambulation: 800 ft   POST:  Rate/Rhythm: 106 paced c/occasional PVC  BP:  Sitting: 192/62 manual         SaO2: 97 RA  Pt ambulated 800 ft on RA, independent, steady gait, tolerated well.  Pt c/o of mild DOE, mild dizziness (improved with rest), denies cp, brief standing rest x1. Pt BP elevated, RN aware. Completed PCI/stent education.  Reviewed risk factors, anti-platelet therapy, stent card, activity restrictions, ntg, exercise, heart healthy diet, carb counting, portion control, and phase 2 cardiac rehab. Pt verbalized understanding. Pt agrees to phase 2 cardiac rehab referral, will send to [facility]. Pt to bed after walk, call bell within reach.  [id number]  Lenna Sciara, RN, BSN 03/02/2015 8:47 AM

## 2015-03-02 NOTE — Progress Notes (Signed)
Patient Name: Robert Burns Date of Encounter: 03/02/2015   SUBJECTIVE  Feeling well. No chest pain, sob or palpitations. Ambulated well this AM.   CURRENT MEDS . albuterol  3 mL Inhalation QID  . amLODipine  10 mg Oral Daily  . aspirin  81 mg Oral Daily  . aspirin EC  81 mg Oral Daily  . cloNIDine  0.1 mg Oral BID  . clopidogrel  75 mg Oral Q breakfast  . gabapentin  300 mg Oral Daily  . glipiZIDE  5 mg Oral Daily  . guaiFENesin-codeine  10 mL Oral Daily  . losartan  100 mg Oral Daily   And  . hydrochlorothiazide  25 mg Oral Daily  . isosorbide mononitrate  30 mg Oral Daily  . metFORMIN  1,000 mg Oral BID WC  . multivitamin with minerals  1 tablet Oral Daily  . sodium chloride  3 mL Intravenous Q12H  . vitamin C  500 mg Oral BID    OBJECTIVE  Filed Vitals:   03/02/15 0356 03/02/15 0400 03/02/15 0625 03/02/15 0803  BP: 170/43 170/43 174/46 199/48  Pulse: 61   81  Temp: 98 F (36.7 C)   98 F (36.7 C)  TempSrc: Axillary   Oral  Resp: 22 21 15 16   Height:      Weight: 174 lb 6.1 oz (79.1 kg)     SpO2: 97%   97%    Intake/Output Summary (Last 24 hours) at 03/02/15 0849 Last data filed at 03/02/15 0653  Gross per 24 hour  Intake 769.28 ml  Output   1330 ml  Net -560.72 ml   Filed Weights   03/01/15 0711 03/02/15 0356  Weight: 174 lb (78.926 kg) 174 lb 6.1 oz (79.1 kg)    PHYSICAL EXAM  General: Pleasant, NAD. Neuro: Alert and oriented X 3. Moves all extremities spontaneously. Psych: Normal affect. HEENT:  Normal  Neck: Supple without bruits or JVD. Lungs:  Resp regular and unlabored, CTA. Heart: RRR no s3, s4, or murmurs. Abdomen: Soft, non-tender, non-distended, BS + x 4.  Extremities: No clubbing, cyanosis or edema. DP/PT/Radials 2+ and equal bilaterally. R groin cath site without hematoma.   Accessory Clinical Findings  CBC  Recent Labs  03/02/15 0622  WBC 15.3*  HGB 12.7*  HCT 38.4*  MCV 86.5  PLT Q000111Q   Basic Metabolic  Panel  Recent Labs  03/02/15 0622  NA 142  K 3.3*  CL 107  CO2 24  GLUCOSE 121*  BUN 14  CREATININE 0.85  CALCIUM 9.4    TELE  A paced rhythm rate mostly in 60-80s. Transiently in 110s.   Radiology/Studies  No results found.  ASSESSMENT AND PLAN  1. CAD (coronary artery disease), Hx CABG 2004 LIMA-LAD, VG-DIAG BRANCH; seq VG -ramus & OM branch; VG-PDA  - Cardiac catheterization on October 24 showed a high-grade stenosis in the proximal segment of the saphenous vein graft bypass to the PDA. He also had a subtotally occluded vein graft to the diagonal artery. He underwent placement of a drug-eluting stent in the SVG to PDA with the plan to perform staged interventional to the other SVG-diagonal lesion. 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 - Presented for staged PCI yesterday s/p successful PCI and stenting of the diagonal branch being graft using drug-eluting stent of the origin/proximal subtotally occluded segment. - Continue ASA, plavix, imdur, ACE  2. HTN - BP is elevated. Currently on Amlodipine 10mg  qd, clonidine 0.1mg  BID, imdur 30mg  qd, losartan 100mg  and HCTZ 25mg . He is on metoprolol 25mg  BID at home , home on admission. Will resume. He has low DBP at times. PRN hydralazine if needed.   3. Hypokalemia - K of 3.3 today. Given supplement.   4. Leukocytosis - WBC of 15.3. Was 19.2 02/23/15.  Jarrett Soho PA-C Pager [phone]

## 2015-03-03 ENCOUNTER — Telehealth: Payer: Self-pay | Admitting: Cardiovascular Disease

## 2015-03-03 NOTE — Telephone Encounter (Signed)
SPOKE DAUGHTER SHE STATES PATIENT HAS BEEN ISSUE WITH PRESSURE -STOMACH AREA ATTEMPTING TO GO THE BATHROOM ALL NIGHT AND DAY  WITH NO SUCCESS. "BLOATING" RN informed daughter, issue is probably not from stent placement -  Daughter wanted to know if patient should go to [facility] or local hospital in [place]. RN informed daughter she can take her father to local hospital if needed.  She verbalized understanding.

## 2015-03-09 ENCOUNTER — Ambulatory Visit (INDEPENDENT_AMBULATORY_CARE_PROVIDER_SITE_OTHER): Payer: Medicare HMO | Admitting: *Deleted

## 2015-03-09 ENCOUNTER — Telehealth: Payer: Self-pay | Admitting: Cardiology

## 2015-03-09 DIAGNOSIS — I495 Sick sinus syndrome: Secondary | ICD-10-CM

## 2015-03-09 NOTE — Telephone Encounter (Signed)
LMOVM reminding pt to send remote transmission.   

## 2015-03-10 ENCOUNTER — Encounter: Payer: Self-pay | Admitting: Cardiology

## 2015-03-12 NOTE — Progress Notes (Signed)
Remote pacemaker transmission.   

## 2015-03-15 ENCOUNTER — Ambulatory Visit (INDEPENDENT_AMBULATORY_CARE_PROVIDER_SITE_OTHER): Payer: Medicare HMO | Admitting: Cardiology

## 2015-03-15 ENCOUNTER — Encounter: Payer: Self-pay | Admitting: Cardiovascular Disease

## 2015-03-15 ENCOUNTER — Encounter: Payer: Self-pay | Admitting: Cardiology

## 2015-03-15 ENCOUNTER — Telehealth: Payer: Self-pay | Admitting: Cardiology

## 2015-03-15 VITALS — BP 152/84 | HR 86 | Ht 68.0 in | Wt 182.1 lb

## 2015-03-15 DIAGNOSIS — G4733 Obstructive sleep apnea (adult) (pediatric): Secondary | ICD-10-CM | POA: Diagnosis not present

## 2015-03-15 DIAGNOSIS — I2510 Atherosclerotic heart disease of native coronary artery without angina pectoris: Secondary | ICD-10-CM | POA: Diagnosis not present

## 2015-03-15 DIAGNOSIS — Z9989 Dependence on other enabling machines and devices: Secondary | ICD-10-CM

## 2015-03-15 DIAGNOSIS — I739 Peripheral vascular disease, unspecified: Secondary | ICD-10-CM | POA: Diagnosis not present

## 2015-03-15 DIAGNOSIS — I10 Essential (primary) hypertension: Secondary | ICD-10-CM | POA: Diagnosis not present

## 2015-03-15 DIAGNOSIS — Z9861 Coronary angioplasty status: Secondary | ICD-10-CM

## 2015-03-15 DIAGNOSIS — I495 Sick sinus syndrome: Secondary | ICD-10-CM

## 2015-03-15 NOTE — Progress Notes (Signed)
03/15/2015 Robert Burns   1940/11/19  [id number]  Primary Physician Thurman Coyer, MD Primary Cardiologist: Dr Gwenlyn Found Dr Sallyanne Kuster  HPI:  74 year old, married Caucasian male, followed by Dr Gwenlyn Found with  a history of CAD, status post CABG March 2004 with a LIMA to his LAD, a vein to a diagonal branch, a sequential vein to a ramus and OM branch, as well as a vein to the PDA.  He also has PVOD, status post left SFA PTA and stenting by Dr Gwenlyn Found October 30, 2002.  He had a MDT pacemaker placed for sick sinus syndrome November 2007 and is followed by Dr. Sallyanne Kuster.           He had a low-grade tumor in the superficial femoral artery which had malignant potential removed by Dr. Kellie Simmering with placement of an interposition 6-mm Gore-Tex graft July 2012. 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 Followup Dopplers performed in our office 09/27/12 revealed a high-grade lesion in the distal right SFA with an ABI of 0.3. His left ABI 1.1 without obstructive disease. He really denies claudication. His most recent lower extremity Doppler Dopplers performed 9/32/16 revealed a right ABI 0.82 And a left ABI of 0.89.           He was seen in the office by Dr Gwenlyn Found in Oct and related a history of chest pain worrisome for angina. He underwent Myoview as an OP 01/21/15 which was abnormal. He was admitted for cath 02/01/15. This revealed high grade disease in the SVG-RCA. He underwent PCI with DES. He did have residual disease in the SVG-Dx and was brought back 03/01/15 for elective staged PCI. He is in the office today for follow up. The pt tells me he had constipation and urinary retention after both procedures. After the second one he required a foley cath which has since been removed. He is followed by a Dealer and is not on Flomax. He denies any recurrent angina.    Current Outpatient Prescriptions  Medication Sig Dispense Refill  . acetaminophen (TYLENOL) 500 MG tablet Take  500 mg by mouth every 6 (six) hours as needed for moderate pain or headache.     . Albuterol Sulfate (PROAIR RESPICLICK) 123XX123 (90 BASE) MCG/ACT AEPB Inhale 2 puffs into the lungs 4 (four) times daily.    [name] amLODipine (NORVASC) 10 MG tablet Take 10 mg by mouth daily.      [name] aspirin EC 81 MG tablet Take 81 mg by mouth daily.      . B Complex Vitamins (B COMPLEX-B12 PO) Take 1 tablet by mouth daily.     . cloNIDine (CATAPRES) 0.1 MG tablet Take 0.1 mg by mouth 2 (two) times daily.    . clopidogrel (PLAVIX) 75 MG tablet Take 1 tablet (75 mg total) by mouth daily with breakfast. 90 tablet 3  . Doxylamine Succinate, Sleep, (SLEEP AID PO) Take 1 tablet by mouth as needed (for sleep).     . gabapentin (NEURONTIN) 100 MG capsule Take 300 mg by mouth daily.     [name] glipiZIDE (GLUCOTROL) 5 MG tablet Take 5 mg by mouth daily.      [name] guaiFENesin-codeine (ROBITUSSIN AC) 100-10 MG/5ML syrup Take 10 mLs by mouth daily.    . imiquimod (ALDARA) 5 % cream Apply 1 application topically daily. Apply daily x 5 days then hold x 2 days then repeat.    . isosorbide mononitrate (IMDUR) 30 MG 24 hr tablet Take 1  tablet (30 mg total) by mouth daily. 30 tablet 11  . losartan-hydrochlorothiazide (HYZAAR) 100-25 MG per tablet Take 1 tablet by mouth daily.      [name] lovastatin (MEVACOR) 40 MG tablet Take 40 mg by mouth at bedtime.      . magnesium hydroxide (MILK OF MAGNESIA) 400 MG/5ML suspension Take 30 mLs by mouth as needed for moderate constipation or indigestion.     . metFORMIN (GLUCOPHAGE) 1000 MG tablet Take 1,000 mg by mouth 2 (two) times daily with a meal.      . metoprolol tartrate (LOPRESSOR) 25 MG tablet Take 25 mg by mouth 2 (two) times daily.      . Multiple Vitamin (MULTIVITAMIN) capsule Take 1 capsule by mouth daily.      . nitroGLYCERIN (NITROSTAT) 0.4 MG SL tablet Place 1 tablet (0.4 mg total) under the tongue every 5 (five) minutes as needed for chest pain. 25 tablet 3  . tamsulosin (FLOMAX) 0.4 MG CAPS capsule  Take 0.4 mg by mouth daily.    . vitamin C (ASCORBIC ACID) 500 MG tablet Take 500 mg by mouth 2 (two) times daily.       No current facility-administered medications for this visit.    Allergies  Allergen Reactions  . Peanut-Containing Drug Products Anaphylaxis and Other (See Comments)    Tongue swelling is severe  . Ace Inhibitors Other (See Comments)    Cough  . Clonidine Hcl Other (See Comments)    Patch only - skin irritation  . Coreg [Carvedilol] Itching  . Diltiazem Hcl Other (See Comments)    Unknown  . Mobic [Meloxicam] Other (See Comments)    GI upset  . Ramipril Other (See Comments)    Unknown    Social History   Social History  . Marital Status: Married    Spouse Name: N/A  . Number of Children: N/A  . Years of Education: N/A   Occupational History  . Not on file.   Social History Main Topics  . Smoking status: Former Smoker -- 5 years    Types: Cigarettes, Pipe    Quit date: 04/10/1973  . Smokeless tobacco: Never Used  . Alcohol Use: No  . Drug Use: No  . Sexual Activity: No   Other Topics Concern  . Not on file   Social History Narrative     Review of Systems: General: negative for chills, fever, night sweats or weight changes.  Cardiovascular: negative for chest pain, dyspnea on exertion, edema, orthopnea, palpitations, paroxysmal nocturnal dyspnea or shortness of breath Dermatological: negative for rash Respiratory: negative for cough or wheezing Urologic: negative for hematuria Abdominal: negative for nausea, vomiting, diarrhea, bright red blood per rectum, melena, or hematemesis Neurologic: negative for visual changes, syncope, or dizziness All other systems reviewed and are otherwise negative except as noted above.    Blood pressure 152/84, pulse 86, height 5\' 8"  (1.727 m), weight 182 lb 1.6 oz (82.6 kg).  General appearance: alert, cooperative and no distress Neck: no carotid bruit and no JVD Lungs: clear to auscultation  bilaterally Heart: regular rate and rhythm Skin: Skin color, texture, turgor normal. No rashes or lesions Neurologic: Grossly normal  EKG NSR, Septal Qs  ASSESSMENT AND PLAN:   CAD S/P SVG-PDA DES 02/01/15, followed by staged SVG-Dx DES 03/01/15 Cath, subsequent PCI after chest pain and abnormal Myoview as an OP. No further angina  CAD -CABG 2004 LIMA-LAD, VG-Dx, seq VG -RI & OM branch; VG-PDA  CABG  06/2002  OSA on  CPAP Reports compliance  SSS (sick sinus syndrome), medtronic adapta Medtronic Adapta, 10/13/05, near EOL when checked in OCT by Dr Sallyanne Kuster  PVD (peripheral vascular disease) [facility]) LSFA PTA by Dr Gwenlyn Found 2004, Lt SFA tumor removed by Dr Kellie Simmering July 2012. LEIA disease noted on recent angiogram, no real claudication history.  Essential hypertension Controlled    PLAN  LE dopplers scheduled for Jan 2017, then f/u with Dr Gwenlyn Found. He will also need a generator change. He is now on monthly remote checks.   Kerin Ransom K PA-C 03/15/2015 4:59 PM

## 2015-03-15 NOTE — Assessment & Plan Note (Signed)
Controlled.  

## 2015-03-15 NOTE — Assessment & Plan Note (Signed)
Reports compliance

## 2015-03-15 NOTE — Assessment & Plan Note (Signed)
Cath, subsequent PCI after chest pain and abnormal Myoview as an OP. No further angina

## 2015-03-15 NOTE — Telephone Encounter (Signed)
Called pt to let him know he will need monthly pacer checks to watch for EOL and that will determine timing of his generator change.   Kerin Ransom PA-C 03/15/2015 5:11 PM

## 2015-03-15 NOTE — Patient Instructions (Signed)
Medication Instructions:  Please continue your current medications based on the listed provided.  Labwork: NONE  Testing/Procedures: Your physician has requested that you have a lower extremity arterial duplex in JANUARY 2017. This test is an ultrasound of the arteries in the legs. It looks at arterial blood flow in the legs. Allow one hour for Lower Arterial scans. There are no restrictions or special instructions.  Follow-Up: Please keep your previously scheduled follow-up appointment with Dr Gwenlyn Found. 05/26/15 at 2:15p  If you need a refill on your cardiac medications before your next appointment, please call your pharmacy.

## 2015-03-15 NOTE — Assessment & Plan Note (Signed)
CABG  06/2002

## 2015-03-15 NOTE — Assessment & Plan Note (Signed)
LSFA PTA by Dr Gwenlyn Found 2004, Lt SFA tumor removed by Dr Kellie Simmering July 2012. LEIA disease noted on recent angiogram, no real claudication history.

## 2015-03-15 NOTE — Assessment & Plan Note (Signed)
Medtronic Adapta, 10/13/05, near EOL when checked in OCT by Dr Sallyanne Kuster

## 2015-03-16 ENCOUNTER — Ambulatory Visit: Payer: Medicare HMO | Admitting: Cardiology

## 2015-03-22 LAB — CUP PACEART REMOTE DEVICE CHECK
Battery Remaining Longevity: 2 mo
Brady Statistic AS VP Percent: 0 %
Brady Statistic AS VS Percent: 2 %
Implantable Lead Implant Date: 20070706
Implantable Lead Implant Date: 20070706
Implantable Lead Location: 753859
Implantable Lead Model: 5092
Lead Channel Impedance Value: 507 Ohm
Lead Channel Impedance Value: 666 Ohm
Lead Channel Setting Pacing Amplitude: 3.25 V
MDC IDC LEAD LOCATION: 753860
MDC IDC MSMT BATTERY IMPEDANCE: 5297 Ohm
MDC IDC MSMT BATTERY VOLTAGE: 2.62 V
MDC IDC SESS DTM: 20161202003622
MDC IDC SET LEADCHNL RA PACING AMPLITUDE: 1.75 V
MDC IDC SET LEADCHNL RV PACING PULSEWIDTH: 0.4 ms
MDC IDC SET LEADCHNL RV SENSING SENSITIVITY: 5.6 mV
MDC IDC STAT BRADY AP VP PERCENT: 0 %
MDC IDC STAT BRADY AP VS PERCENT: 98 %

## 2015-03-23 ENCOUNTER — Other Ambulatory Visit: Payer: Self-pay | Admitting: Cardiovascular Disease

## 2015-03-23 ENCOUNTER — Encounter: Payer: Self-pay | Admitting: Cardiology

## 2015-03-23 DIAGNOSIS — I739 Peripheral vascular disease, unspecified: Secondary | ICD-10-CM

## 2015-03-25 ENCOUNTER — Telehealth (HOSPITAL_COMMUNITY): Payer: Self-pay | Admitting: *Deleted

## 2015-03-25 NOTE — Telephone Encounter (Signed)
Received signed MD order.  Message left answering machine for pt to please contact cardiac rehab. Cherre Huger, BSN

## 2015-04-09 ENCOUNTER — Telehealth: Payer: Self-pay | Admitting: Cardiology

## 2015-04-09 ENCOUNTER — Ambulatory Visit (INDEPENDENT_AMBULATORY_CARE_PROVIDER_SITE_OTHER): Payer: Medicare HMO | Admitting: *Deleted

## 2015-04-09 DIAGNOSIS — I495 Sick sinus syndrome: Secondary | ICD-10-CM

## 2015-04-09 NOTE — Progress Notes (Signed)
Remote pacemaker transmission.   

## 2015-04-09 NOTE — Telephone Encounter (Signed)
Spoke w/ pt and informed him that his device has reached ERI and that a scheduler will be contacting him to schedule an appt w/ MD to talk about procedure. Pt verbalized understanding.

## 2015-04-20 ENCOUNTER — Other Ambulatory Visit: Payer: Self-pay | Admitting: Cardiovascular Disease

## 2015-04-20 ENCOUNTER — Ambulatory Visit (HOSPITAL_COMMUNITY)
Admission: RE | Admit: 2015-04-20 | Discharge: 2015-04-20 | Disposition: A | Discharge status: Home / Self Care | Payer: Medicare HMO | Source: Ambulatory Visit | Attending: Cardiology | Admitting: Cardiology

## 2015-04-20 DIAGNOSIS — I739 Peripheral vascular disease, unspecified: Secondary | ICD-10-CM

## 2015-04-20 DIAGNOSIS — E785 Hyperlipidemia, unspecified: Secondary | ICD-10-CM | POA: Diagnosis not present

## 2015-04-20 DIAGNOSIS — E119 Type 2 diabetes mellitus without complications: Secondary | ICD-10-CM | POA: Insufficient documentation

## 2015-04-20 DIAGNOSIS — R938 Abnormal findings on diagnostic imaging of other specified body structures: Secondary | ICD-10-CM | POA: Diagnosis not present

## 2015-04-20 DIAGNOSIS — I700 Atherosclerosis of aorta: Secondary | ICD-10-CM | POA: Diagnosis not present

## 2015-04-20 DIAGNOSIS — I10 Essential (primary) hypertension: Secondary | ICD-10-CM | POA: Insufficient documentation

## 2015-04-20 DIAGNOSIS — I708 Atherosclerosis of other arteries: Secondary | ICD-10-CM | POA: Insufficient documentation

## 2015-04-20 NOTE — Progress Notes (Deleted)
This encounter was created in error - please disregard.

## 2015-04-21 ENCOUNTER — Other Ambulatory Visit: Payer: Self-pay | Admitting: Physician Assistant

## 2015-04-21 ENCOUNTER — Encounter (INDEPENDENT_AMBULATORY_CARE_PROVIDER_SITE_OTHER): Payer: Medicare HMO | Admitting: Physician Assistant

## 2015-04-21 ENCOUNTER — Encounter: Payer: Self-pay | Admitting: Physician Assistant

## 2015-04-21 ENCOUNTER — Ambulatory Visit (INDEPENDENT_AMBULATORY_CARE_PROVIDER_SITE_OTHER): Payer: Medicare HMO | Admitting: Physician Assistant

## 2015-04-21 ENCOUNTER — Telehealth: Payer: Self-pay

## 2015-04-21 ENCOUNTER — Other Ambulatory Visit: Payer: Self-pay | Admitting: *Deleted

## 2015-04-21 ENCOUNTER — Encounter: Payer: Self-pay | Admitting: *Deleted

## 2015-04-21 VITALS — BP 160/84 | HR 70 | Ht 68.0 in | Wt 183.0 lb

## 2015-04-21 DIAGNOSIS — I495 Sick sinus syndrome: Secondary | ICD-10-CM | POA: Diagnosis not present

## 2015-04-21 DIAGNOSIS — I25810 Atherosclerosis of coronary artery bypass graft(s) without angina pectoris: Secondary | ICD-10-CM

## 2015-04-21 DIAGNOSIS — I10 Essential (primary) hypertension: Secondary | ICD-10-CM | POA: Diagnosis not present

## 2015-04-21 LAB — BASIC METABOLIC PANEL
BUN: 17 mg/dL (ref 7–25)
CALCIUM: 9.6 mg/dL (ref 8.6–10.3)
CO2: 26 mmol/L (ref 20–31)
Chloride: 105 mmol/L (ref 98–110)
Creat: 0.91 mg/dL (ref 0.70–1.18)
GLUCOSE: 172 mg/dL — AB (ref 65–99)
POTASSIUM: 4.2 mmol/L (ref 3.5–5.3)
SODIUM: 140 mmol/L (ref 135–146)

## 2015-04-21 LAB — CBC
HCT: 40.9 % (ref 39.0–52.0)
HEMOGLOBIN: 13.3 g/dL (ref 13.0–17.0)
MCH: 27.8 pg (ref 26.0–34.0)
MCHC: 32.5 g/dL (ref 30.0–36.0)
MCV: 85.4 fL (ref 78.0–100.0)
MPV: 11.7 fL (ref 8.6–12.4)
PLATELETS: 202 10*3/uL (ref 150–400)
RBC: 4.79 MIL/uL (ref 4.22–5.81)
RDW: 14.7 % (ref 11.5–15.5)
WBC: 10.2 10*3/uL (ref 4.0–10.5)

## 2015-04-21 NOTE — Telephone Encounter (Signed)
-----   Message from Lorretta Harp, MD sent at 04/21/2015  3:46 PM EST ----- No change from prior study. Repeat in 6 months

## 2015-04-21 NOTE — Progress Notes (Addendum)
Cardiology Office Note Date:  04/21/2015  Patient ID:  Robert Burns, Robert Burns Aug 25, 1940, MRN [id number] PCP:  Thurman Coyer, MD  Cardiologist:  Dr. Gwenlyn Found, Dr. Sallyanne Kuster   Chief Complaint: PPM has reached ERI  History of Present Illness: Robert Burns is a 75 y.o. male with history of CAD with CABG in 2004 though more recently PCI of SVG to PDA with DES 02/01/15 and then staged PCI to SVG of the Diag also with DES, PVD with stenting left SFA in 2004 and tumor removal/graft SFA in 2012. He has PPM implanted 2007 for su=inus node dysfunction, HTN, HLD, OSA, and DM.  He comes in today feeling    Past Medical History  Diagnosis Date  . Hypertension   . Leg pain   . Hyperlipidemia   . CAD (coronary artery disease)     s/p CABG  06/2002; last nuc 07/18/11 normal EF and negative for ischemia; myoview 01/21/15 abnormal, cath 02/01/15 DES to prox SVG to PDA, subtotal occlusion of SVG to Diag to be treated later.  . SSS (sick sinus syndrome) ([place])     Medtronic Adapta, 10/13/05  . 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 Cancer (HCC)     Right Shoulder, Left Leg- BCC, SCC, AND MELANOMA  . Presence of permanent cardiac pacemaker   . OSA on CPAP   . Type II diabetes mellitus ([place])   . Arthritis     "all over"  . Urgency of urination     Past Surgical History  Procedure Laterality Date  . Coronary artery bypass graft  06/2002    x5, LIMA-LAD;VG- Diag; seq VG- ramus & OM branch; VG-PDA  . Tonsillectomy and adenoidectomy    . Tumor excision Right ~ 2005    cancerous tumor removed from shoulder  . Tumor excision Right ~ 2000    benign tumor removed from under shoulder  . Knee surgery Right 1950's    "broke my lower leg; had to put pin in my knee to keep lower leg in place til it healed"  . Foot fracture surgery Left   . Femoral artery  stent Left ~ 2014    "taken out of my leg; couldn' catorgorize what kind so the put it under all 3"; cataroziepitheloid hemanioendotheliomau  . Tumor excision Left     resection of Lt SFA wth interposition of Gore-Tex graft  . Cardiac catheterization N/A 02/01/2015    Procedure: Left Heart Cath and Coronary Angiography;  Surgeon: Lorretta Harp, MD;  Location: [facility];  Service: Cardiovascular;  Laterality: N/A;  . Cardiac catheterization N/A 02/01/2015    Procedure: Coronary Stent Intervention;  Surgeon: Lorretta Harp, MD;  Location: [facility];  Service: Cardiovascular;  Laterality: N/A;  . Insert / replace / remove pacemaker  10/13/05    right side, medtronic Adapta  . Knee hardware removal Right 1950's    "3-4 months after the insertion"  . Cardiac catheterization  06/2002    "just before bypass OR"  . Fracture surgery    . Coronary angioplasty    . Cardiac catheterization N/A 03/01/2015    Procedure: Coronary Stent Intervention;  Surgeon: Lorretta Harp, MD;  Location: [facility];  Service: Cardiovascular;  Laterality: N/A;    Current Outpatient Prescriptions  Medication Sig Dispense Refill  .  acetaminophen (TYLENOL) 500 MG tablet Take 500 mg by mouth every 6 (six) hours as needed for moderate pain or headache.     . Albuterol Sulfate (PROAIR RESPICLICK) 123XX123 (90 BASE) MCG/ACT AEPB Inhale 2 puffs into the lungs 4 (four) times daily.    [name] amLODipine (NORVASC) 10 MG tablet Take 10 mg by mouth daily.      [name] aspirin EC 81 MG tablet Take 81 mg by mouth daily.      . B Complex Vitamins (B COMPLEX-B12 PO) Take 1 tablet by mouth daily.     . cloNIDine (CATAPRES) 0.1 MG tablet Take 0.1 mg by mouth 2 (two) times daily.    . clopidogrel (PLAVIX) 75 MG tablet Take 1 tablet (75 mg total) by mouth daily with breakfast. 90 tablet 3  . Doxylamine Succinate, Sleep, (SLEEP AID PO) Take 1 tablet by mouth as needed (for sleep).     . gabapentin (NEURONTIN) 100 MG capsule  Take 300 mg by mouth daily.     [name] glipiZIDE (GLUCOTROL) 5 MG tablet Take 5 mg by mouth daily.      [name] guaiFENesin-codeine (ROBITUSSIN AC) 100-10 MG/5ML syrup Take 10 mLs by mouth daily.    . imiquimod (ALDARA) 5 % cream Apply 1 application topically daily. Apply daily x 5 days then hold x 2 days then repeat.    . isosorbide mononitrate (IMDUR) 30 MG 24 hr tablet Take 1 tablet (30 mg total) by mouth daily. 30 tablet 11  . losartan-hydrochlorothiazide (HYZAAR) 100-25 MG per tablet Take 1 tablet by mouth daily.      [name] lovastatin (MEVACOR) 40 MG tablet Take 40 mg by mouth at bedtime.      . magnesium hydroxide (MILK OF MAGNESIA) 400 MG/5ML suspension Take 30 mLs by mouth as needed for moderate constipation or indigestion.     . metFORMIN (GLUCOPHAGE) 1000 MG tablet Take 1,000 mg by mouth 2 (two) times daily with a meal.      . metoprolol tartrate (LOPRESSOR) 25 MG tablet Take 25 mg by mouth 2 (two) times daily.      . Multiple Vitamin (MULTIVITAMIN) capsule Take 1 capsule by mouth daily.      . nitroGLYCERIN (NITROSTAT) 0.4 MG SL tablet Place 1 tablet (0.4 mg total) under the tongue every 5 (five) minutes as needed for chest pain. 25 tablet 3  . tamsulosin (FLOMAX) 0.4 MG CAPS capsule Take 0.4 mg by mouth daily.    . vitamin C (ASCORBIC ACID) 500 MG tablet Take 500 mg by mouth 2 (two) times daily.       No current facility-administered medications for this visit.    Allergies:   Peanut-containing drug products; Ace inhibitors; Clonidine hcl; Coreg; Diltiazem hcl; Mobic; and Ramipril   Social History:  The patient  reports that he quit smoking about 42 years ago. His smoking use included Cigarettes and Pipe. He quit after 5 years of use. He has never used smokeless tobacco. He reports that he does not drink alcohol or use illicit drugs.   Family History:  The patient's family history includes Coronary artery disease in his mother; Diabetes in his father and sister; Heart attack in his father and  mother; Heart disease in his father, mother, and sister; Hyperlipidemia in his father; Hypertension in his mother and sister.  ROS:  Please see the history of present illness.   All other systems are reviewed and otherwise negative.   PHYSICAL EXAM:  VS:  160/78,  183lbs, 5'8", 70bpm  Well nourished, well developed, in no acute distress HEENT: normocephalic, atraumatic Neck: no JVD, carotid bruits or masses Cardiac:  normal S1, S2; RRR; no significant murmurs, no rubs, or gallops Lungs:  clear to auscultation bilaterally, no wheezing, rhonchi or rales Abd: soft, nontender MS: no deformity or atrophy Ext: no edema Skin: warm and dry, no rash Neuro:  No gross deficits appreciated Psych: euthymic mood, full affect  **PPM site is stable, no tethering or discomfort   EKG:  Done today shows SR, V paced, PVC PPM check today shows:   03/01/15 PCI, Dr. Gwenlyn Found Procedures    Coronary Stent Intervention    Conclusion     Prox Graft lesion, 90% stenosed.  Origin to Prox Graft lesion, 99% stenosed. Post intervention, there is a 0% residual stenosis.  Dist Graft lesion, 95% stenosed. Post intervention, there is a 0% residual stenosis.   02/01/15: LHC/PCI, Dr. Gwenlyn Found Procedures    Coronary Stent Intervention   Left Heart Cath and Coronary Angiography    Conclusion     Prox LAD to Mid LAD lesion, 100% stenosed.  Ost 2nd Diag lesion, 100% stenosed.  Prox RCA to Mid RCA lesion, 100% stenosed.  SVG was injected is normal in caliber.  There is severe disease in the graft.  95% proximal SVG to PDA  was injected is normal in caliber.  There is mild disease in the graft.  60% stenosis at the insertion of the LIMA to the LAD  Dist LAD lesion, 80% stenosed.  Insertion lesion, 60% stenosed.  SVG was injected is normal in caliber.  There is moderate focal disease in the graft.  70% stenosis in the SVG proximal to the OM insertion  Subtotally occluded diagonal branch  vein graft at the aorta  Origin lesion, 99% stenosed.  Prox Graft lesion, 60% stenosed.  Prox Graft lesion, 95% stenosed. Post intervention, there is a 0% residual stenosis.   01/21/15: stress myoview Study Highlights     Nuclear stress EF: 43%.  The left ventricular ejection fraction is moderately decreased (30-44%).  Defect 1: There is a small defect of mild severity present in the mid anterolateral location.  Findings consistent with ischemia.  This is an intermediate risk study.  Intermediate risk lexiscan nuclear study demonstrating a small region of mild ischemia in the mid anterior-anterolateral wall with associated mild focal hypocontractility and EF 43%.       Recent Labs: 01/27/2015: TSH 0.964 03/02/2015: BUN 14; Creatinine, Ser 0.85; Hemoglobin 12.7*; Platelets 182; Potassium 3.3*; Sodium 142  No results found for requested labs within last 365 days.   CrCl cannot be calculated (Unknown ideal weight.).   Wt Readings from Last 3 Encounters:  03/15/15 182 lb 1.6 oz (82.6 kg)  03/02/15 174 lb 6.1 oz (79.1 kg)  02/23/15 181 lb 14.4 oz (82.509 kg)     Other studies reviewed: Additional studies/records reviewed today include: summarized above  DEVICE information: MDT PPM, implanted 10/13/05 by Dr. Tami Ribas,  Sinus node dysfunction, sinus arrest, follows with Dr. Sallyanne Kuster  ASSESSMENT AND PLAN: **site check **remotes  1. PPM at [facility] d/w Dr. Sallyanne Kuster,  Will plan for generator change, will proceed without interruption of his ASA/Plavix given his recent DES/PCI's, and stated no need for temp wire needed.  2.  CAD ** no CP Last intervention 03/01/15 On ASA/Plavix (counseled on medicines), nitrate, statin, BB Continue with Dr. Gwenlyn Found  3. PVD C/w Dr. Gwenlyn Found  4. HTN     High here today, the patient states is  unusual for him  Disposition: F/u with **  Current medicines are reviewed at length with the patient today.  The patient did not have any concerns  regarding medicines.**  SignedJennings Books, PA-C 04/21/2015 10:51 AM     [facility] [address] [place] [address] [phone] (office)  [phone] (fax)

## 2015-04-21 NOTE — Progress Notes (Addendum)
Cardiology Office Note Date: 04/21/2015  Patient ID: Robert, Burns [birth year], MRN [id number] PCP: Thurman Coyer, MD Cardiologist: Dr. Gwenlyn Found, Dr. Sallyanne Kuster  Chief Complaint: PPM has reached ERI  History of Present Illness: Robert Burns is a 75 y.o. male with history of CAD with CABG in 2004 though more recently PCI of SVG to PDA with DES 02/01/15 and then staged PCI to SVG of the Diag also with DES 03/01/15, PVD with stenting left SFA in 2004 and tumor removal/graft SFA in 2012. He has PPM implanted 2007 for siinus node dysfunction, HTN, HLD, OSA, and DM. He comes in today feeling a little sluggish/no energy in the last few weeks,(likely when he went VVI), no dizziness, near syncope or syncope,  no CP, palpitations or SOB.    By the last note by Dr. Sallyanne Kuster: There is 99.1% atrial pacing but only 0.8% ventricular pacing. There is no atrial activity whatsoever when pacing this temporarily suspended and there is no immediate ventricular escape rhythm. He appears to be pacemaker dependent   Past Medical History  Diagnosis Date  . Hypertension   . Leg pain   . Hyperlipidemia   . CAD (coronary artery disease)     s/p CABG 06/2002; last nuc 07/18/11 normal EF and negative for ischemia; myoview 01/21/15 abnormal, cath 02/01/15 DES to prox SVG to PDA, subtotal occlusion of SVG to Diag to be treated later.  . SSS (sick sinus syndrome) ([place])     Medtronic Adapta, 10/13/05  . 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 Cancer (HCC)     Right Shoulder, Left Leg- BCC, SCC, AND MELANOMA  . Presence of permanent cardiac pacemaker   . OSA on CPAP   . Type II diabetes mellitus ([place])   . Arthritis     "all over"  . Urgency of urination     Past Surgical History  Procedure Laterality Date  .  Coronary artery bypass graft  06/2002    x5, LIMA-LAD;VG- Diag; seq VG- ramus & OM branch; VG-PDA  . Tonsillectomy and adenoidectomy    . Tumor excision Right ~ 2005    cancerous tumor removed from shoulder  . Tumor excision Right ~ 2000    benign tumor removed from under shoulder  . Knee surgery Right 1950's    "broke my lower leg; had to put pin in my knee to keep lower leg in place til it healed"  . Foot fracture surgery Left   . Femoral artery stent Left ~ 2014    "taken out of my leg; couldn' catorgorize what kind so the put it under all 3"; cataroziepitheloid hemanioendotheliomau  . Tumor excision Left     resection of Lt SFA wth interposition of Gore-Tex graft  . Cardiac catheterization N/A 02/01/2015    Procedure: Left Heart Cath and Coronary Angiography; Surgeon: Lorretta Harp, MD; Location: [place] [facility]; Service: Cardiovascular; Laterality: N/A;  . Cardiac catheterization N/A 02/01/2015    Procedure: Coronary Stent Intervention; Surgeon: Lorretta Harp, MD; Location: [place] [facility]; Service: Cardiovascular; Laterality: N/A;  . Insert / replace / remove pacemaker  10/13/05    right side, medtronic Adapta  . Knee hardware removal Right 1950's    "3-4 months after the insertion"  . Cardiac catheterization  06/2002    "just before bypass OR"  .  Fracture surgery    . Coronary angioplasty    . Cardiac catheterization N/A 03/01/2015    Procedure: Coronary Stent Intervention; Surgeon: Lorretta Harp, MD; Location: [place] [facility]; Service: Cardiovascular; Laterality: N/A;    Current Outpatient Prescriptions  Medication Sig Dispense Refill  . acetaminophen (TYLENOL) 500 MG tablet Take 500 mg by mouth every 6 (six) hours as needed for moderate pain or headache.     . Albuterol Sulfate (PROAIR RESPICLICK) 123XX123 (90 BASE) MCG/ACT AEPB Inhale 2 puffs into the lungs 4 (four) times daily.    [name] amLODipine (NORVASC) 10 MG  tablet Take 10 mg by mouth daily.     [name] aspirin EC 81 MG tablet Take 81 mg by mouth daily.     . B Complex Vitamins (B COMPLEX-B12 PO) Take 1 tablet by mouth daily.     . cloNIDine (CATAPRES) 0.1 MG tablet Take 0.1 mg by mouth 2 (two) times daily.    . clopidogrel (PLAVIX) 75 MG tablet Take 1 tablet (75 mg total) by mouth daily with breakfast. 90 tablet 3  . Doxylamine Succinate, Sleep, (SLEEP AID PO) Take 1 tablet by mouth as needed (for sleep).     . gabapentin (NEURONTIN) 100 MG capsule Take 300 mg by mouth daily.     [name] glipiZIDE (GLUCOTROL) 5 MG tablet Take 5 mg by mouth daily.     [name] guaiFENesin-codeine (ROBITUSSIN AC) 100-10 MG/5ML syrup Take 10 mLs by mouth daily.    . imiquimod (ALDARA) 5 % cream Apply 1 application topically daily. Apply daily x 5 days then hold x 2 days then repeat.    . isosorbide mononitrate (IMDUR) 30 MG 24 hr tablet Take 1 tablet (30 mg total) by mouth daily. 30 tablet 11  . losartan-hydrochlorothiazide (HYZAAR) 100-25 MG per tablet Take 1 tablet by mouth daily.     [name] lovastatin (MEVACOR) 40 MG tablet Take 40 mg by mouth at bedtime.     . magnesium hydroxide (MILK OF MAGNESIA) 400 MG/5ML suspension Take 30 mLs by mouth as needed for moderate constipation or indigestion.     . metFORMIN (GLUCOPHAGE) 1000 MG tablet Take 1,000 mg by mouth 2 (two) times daily with a meal.     . metoprolol tartrate (LOPRESSOR) 25 MG tablet Take 25 mg by mouth 2 (two) times daily.     . Multiple Vitamin (MULTIVITAMIN) capsule Take 1 capsule by mouth daily.     . nitroGLYCERIN (NITROSTAT) 0.4 MG SL tablet Place 1 tablet (0.4 mg total) under the tongue every 5 (five) minutes as needed for chest pain. 25 tablet 3  . tamsulosin (FLOMAX) 0.4 MG CAPS capsule Take 0.4 mg by mouth daily.    . vitamin C (ASCORBIC ACID) 500 MG tablet Take 500 mg by mouth 2 (two) times daily.      No current facility-administered medications for this visit.     Allergies: Peanut-containing drug products; Ace inhibitors; Clonidine hcl; Coreg; Diltiazem hcl; Mobic; and Ramipril   Social History: The patient  reports that he quit smoking about 42 years ago. His smoking use included Cigarettes and Pipe. He quit after 5 years of use. He has never used smokeless tobacco. He reports that he does not drink alcohol or use illicit drugs.   Family History: The patient's family history includes Coronary artery disease in his mother; Diabetes in his father and sister; Heart attack in his father and mother; Heart disease in his father, mother, and sister; Hyperlipidemia in his father; Hypertension  in his mother and sister.  ROS: Please see the history of present illness.  All other systems are reviewed and otherwise negative.   PHYSICAL EXAM:  VS: 160/78, 183lbs, 5'8", 70bpm Well nourished, well developed, in no acute distress  HEENT: normocephalic, atraumatic  Neck: no JVD, carotid bruits or masses Cardiac: normal S1, S2; RRR; no significant murmurs, no rubs, or gallops Lungs: clear to auscultation bilaterally, no wheezing, rhonchi or rales  Abd: soft, nontender MS: no deformity or atrophy Ext: no edema  Skin: warm and dry, no rash Neuro: No gross deficits appreciated Psych: euthymic mood, full affect  PPM site is stable, no tethering or discomfort ( right chest)   EKG: Done today shows SR, V paced, PVC PPM check today with industry shows: device has switched to VVI, at ERI reached 03/27/15, <1% V pacing, single chamber hysteresis is programmed on at 50bpm.  intrinsic V sensing at 60bpm, one episode NSVT 15beats  03/01/15 PCI, Dr. Gwenlyn Found Procedures    Coronary Stent Intervention    Conclusion     Prox Graft lesion, 90% stenosed.  Origin to Prox Graft lesion, 99% stenosed. Post intervention, there is a 0% residual stenosis.  Dist Graft lesion, 95% stenosed. Post intervention, there is a 0% residual stenosis.   02/01/15:  L[facility]/PCI, Dr. Gwenlyn Found Procedures    Coronary Stent Intervention   Left Heart Cath and Coronary Angiography    Conclusion     Prox LAD to Mid LAD lesion, 100% stenosed.  Ost 2nd Diag lesion, 100% stenosed.  Prox RCA to Mid RCA lesion, 100% stenosed.  SVG was injected is normal in caliber.  There is severe disease in the graft.  95% proximal SVG to PDA  was injected is normal in caliber.  There is mild disease in the graft.  60% stenosis at the insertion of the LIMA to the LAD  Dist LAD lesion, 80% stenosed.  Insertion lesion, 60% stenosed.  SVG was injected is normal in caliber.  There is moderate focal disease in the graft.  70% stenosis in the SVG proximal to the OM insertion  Subtotally occluded diagonal branch vein graft at the aorta  Origin lesion, 99% stenosed.  Prox Graft lesion, 60% stenosed.  Prox Graft lesion, 95% stenosed. Post intervention, there is a 0% residual stenosis.   01/21/15: stress myoview Study Highlights     Nuclear stress EF: 43%.  The left ventricular ejection fraction is moderately decreased (30-44%).  Defect 1: There is a small defect of mild severity present in the mid anterolateral location.  Findings consistent with ischemia.  This is an intermediate risk study.  Intermediate risk lexiscan nuclear study demonstrating a small region of mild ischemia in the mid anterior-anterolateral wall with associated mild focal hypocontractility and EF 43%.       Recent Labs: 01/27/2015: TSH 0.964 03/02/2015: BUN 14; Creatinine, Ser 0.85; Hemoglobin 12.7*; Platelets 182; Potassium 3.3*; Sodium 142  No results found for requested labs within last 365 days.   CrCl cannot be calculated (Unknown ideal weight.).   Wt Readings from Last 3 Encounters:  03/15/15 182 lb 1.6 oz (82.6 kg)  03/02/15 174 lb 6.1 oz (79.1 kg)  02/23/15 181 lb 14.4 oz (82.509 kg)     Other studies reviewed: Additional studies/records reviewed today  include: summarized above  DEVICE information: MDT PPM, implanted 10/13/05 by Dr. Tami Ribas, Sinus node dysfunction, sinus arrest, follows with Dr. Sallyanne Kuster  ASSESSMENT AND PLAN:  1. PPM at [facility] d/w Dr. Sallyanne Kuster, Will plan for  generator change, scheduled for this Fridayy with Dr. Sallyanne Kuster, will proceed without interruption of his ASA/Plavix given his recent DES/PCI's, and stated no need for temp wire needed. Pt states understanding he is to continue his ASA and plavix without interruption, discussed risk/benefit of the generator change and he is agreeable to proceed.  2. CAD no CP Last intervention 03/01/15 and 02/01/15 On ASA/Plavix (counseled on medicines), nitrate, statin, BB Continue with Dr. Gwenlyn Found  3. PVD  c/w Dr. Gwenlyn Found  4. HTN  High here today, the patient states is unusual for him     follow  Disposition: pre-procedure labs today, f/u with wound check post generator change and Dr. Sallyanne Kuster post procedure.  Current medicines are reviewed at length with the patient today. The patient did not have any concerns regarding medicines.  Haywood Lasso, PA-C 04/21/2015 10:51 AM   [facility] [address] [place] [address] [phone] (office)  [phone] (fax)

## 2015-04-21 NOTE — Addendum Note (Signed)
Addended by: France Ravens on: 04/21/2015 03:10 PM   Modules accepted: Level of Service

## 2015-04-21 NOTE — Patient Instructions (Addendum)
Medication Instructions:   CONTINUE SAME MEDICATION   If you need a refill on your cardiac medications before your next appointment, please call your pharmacy.  Labwork:  CBC AND BMET   Testing/Procedures:  SEE LETTER PPM CHANGE 04/23/15   Follow-Up:  WOUND CHECK  10 DAYS AFTER : 04/23/15   PHYS PACER CHECK 31 DAYS AFTER 04/23/15  WITH DR C AT [facility]      Any Other Special Instructions Will Be Listed Below (If Applicable).

## 2015-04-23 ENCOUNTER — Telehealth: Payer: Self-pay | Admitting: *Deleted

## 2015-04-23 ENCOUNTER — Encounter (HOSPITAL_COMMUNITY)
Admission: RE | Disposition: A | Discharge status: Home / Self Care | Payer: Medicare HMO | Source: Ambulatory Visit | Attending: Cardiovascular Disease

## 2015-04-23 ENCOUNTER — Ambulatory Visit (HOSPITAL_COMMUNITY)
Admission: RE | Admit: 2015-04-23 | Discharge: 2015-04-23 | Disposition: A | Discharge status: Home / Self Care | Payer: Medicare HMO | Source: Ambulatory Visit | Attending: Cardiovascular Disease | Admitting: Cardiovascular Disease

## 2015-04-23 DIAGNOSIS — Z7982 Long term (current) use of aspirin: Secondary | ICD-10-CM | POA: Insufficient documentation

## 2015-04-23 DIAGNOSIS — Z45010 Encounter for checking and testing of cardiac pacemaker pulse generator [battery]: Secondary | ICD-10-CM

## 2015-04-23 DIAGNOSIS — Z951 Presence of aortocoronary bypass graft: Secondary | ICD-10-CM | POA: Diagnosis not present

## 2015-04-23 DIAGNOSIS — Z87891 Personal history of nicotine dependence: Secondary | ICD-10-CM | POA: Diagnosis not present

## 2015-04-23 DIAGNOSIS — M1990 Unspecified osteoarthritis, unspecified site: Secondary | ICD-10-CM | POA: Diagnosis not present

## 2015-04-23 DIAGNOSIS — Z7984 Long term (current) use of oral hypoglycemic drugs: Secondary | ICD-10-CM | POA: Insufficient documentation

## 2015-04-23 DIAGNOSIS — I2582 Chronic total occlusion of coronary artery: Secondary | ICD-10-CM | POA: Diagnosis not present

## 2015-04-23 DIAGNOSIS — Z8249 Family history of ischemic heart disease and other diseases of the circulatory system: Secondary | ICD-10-CM | POA: Insufficient documentation

## 2015-04-23 DIAGNOSIS — I10 Essential (primary) hypertension: Secondary | ICD-10-CM | POA: Diagnosis not present

## 2015-04-23 DIAGNOSIS — I25810 Atherosclerosis of coronary artery bypass graft(s) without angina pectoris: Secondary | ICD-10-CM | POA: Diagnosis not present

## 2015-04-23 DIAGNOSIS — Z95820 Peripheral vascular angioplasty status with implants and grafts: Secondary | ICD-10-CM | POA: Diagnosis not present

## 2015-04-23 DIAGNOSIS — G4733 Obstructive sleep apnea (adult) (pediatric): Secondary | ICD-10-CM | POA: Insufficient documentation

## 2015-04-23 DIAGNOSIS — E785 Hyperlipidemia, unspecified: Secondary | ICD-10-CM | POA: Diagnosis not present

## 2015-04-23 DIAGNOSIS — I495 Sick sinus syndrome: Secondary | ICD-10-CM | POA: Diagnosis not present

## 2015-04-23 DIAGNOSIS — Z85828 Personal history of other malignant neoplasm of skin: Secondary | ICD-10-CM | POA: Insufficient documentation

## 2015-04-23 DIAGNOSIS — Z7902 Long term (current) use of antithrombotics/antiplatelets: Secondary | ICD-10-CM | POA: Diagnosis not present

## 2015-04-23 DIAGNOSIS — I97190 Other postprocedural cardiac functional disturbances following cardiac surgery: Secondary | ICD-10-CM

## 2015-04-23 DIAGNOSIS — E1151 Type 2 diabetes mellitus with diabetic peripheral angiopathy without gangrene: Secondary | ICD-10-CM | POA: Diagnosis not present

## 2015-04-23 DIAGNOSIS — Z955 Presence of coronary angioplasty implant and graft: Secondary | ICD-10-CM | POA: Insufficient documentation

## 2015-04-23 DIAGNOSIS — Z950 Presence of cardiac pacemaker: Secondary | ICD-10-CM

## 2015-04-23 HISTORY — PX: EP IMPLANTABLE DEVICE: SHX172B

## 2015-04-23 LAB — GLUCOSE, CAPILLARY
GLUCOSE-CAPILLARY: 124 mg/dL — AB (ref 65–99)
GLUCOSE-CAPILLARY: 74 mg/dL (ref 65–99)

## 2015-04-23 LAB — SURGICAL PCR SCREEN
MRSA, PCR: NEGATIVE
STAPHYLOCOCCUS AUREUS: NEGATIVE

## 2015-04-23 SURGERY — PPM/BIV PPM GENERATOR CHANGEOUT
Anesthesia: LOCAL

## 2015-04-23 MED ORDER — SODIUM CHLORIDE 0.9 % IV SOLN
INTRAVENOUS | Status: DC
  Administered 2015-04-23: 12:00:00 via INTRAVENOUS

## 2015-04-23 MED ORDER — CEFAZOLIN SODIUM-DEXTROSE 2-3 GM-% IV SOLR
INTRAVENOUS | Status: AC
  Filled 2015-04-23: qty 50

## 2015-04-23 MED ORDER — SODIUM CHLORIDE 0.9 % IR SOLN: 80.0000 mg | Status: DC

## 2015-04-23 MED ORDER — ONDANSETRON HCL 4 MG/2ML IJ SOLN: 4.0000 mg | Freq: Four times a day (QID) | INTRAMUSCULAR | Status: DC | PRN

## 2015-04-23 MED ORDER — LIDOCAINE HCL (PF) 1 % IJ SOLN
INTRAMUSCULAR | Status: AC
  Filled 2015-04-23: qty 60

## 2015-04-23 MED ORDER — MUPIROCIN 2 % EX OINT
1.0000 "application " | TOPICAL_OINTMENT | Freq: Once | CUTANEOUS | Status: AC
  Administered 2015-04-23: 1 via TOPICAL

## 2015-04-23 MED ORDER — CHLORHEXIDINE GLUCONATE 4 % EX LIQD: 60.0000 mL | Freq: Once | CUTANEOUS | Status: DC

## 2015-04-23 MED ORDER — ACETAMINOPHEN 325 MG PO TABS: 325.0000 mg | ORAL_TABLET | ORAL | Status: DC | PRN

## 2015-04-23 MED ORDER — LIDOCAINE HCL (PF) 1 % IJ SOLN
INTRAMUSCULAR | Status: AC
  Filled 2015-04-23: qty 30

## 2015-04-23 MED ORDER — LIDOCAINE HCL (PF) 1 % IJ SOLN
INTRAMUSCULAR | Status: DC | PRN
  Administered 2015-04-23: 32 mL via INTRADERMAL

## 2015-04-23 MED ORDER — SODIUM CHLORIDE 0.9 % IR SOLN
Status: AC
  Filled 2015-04-23: qty 2

## 2015-04-23 MED ORDER — SODIUM CHLORIDE 0.9 % IV SOLN: INTRAVENOUS | Status: DC

## 2015-04-23 MED ORDER — MUPIROCIN 2 % EX OINT
TOPICAL_OINTMENT | CUTANEOUS | Status: AC
  Administered 2015-04-23: 1 via TOPICAL
  Filled 2015-04-23: qty 22

## 2015-04-23 MED ORDER — CEFAZOLIN SODIUM-DEXTROSE 2-3 GM-% IV SOLR
2.0000 g | INTRAVENOUS | Status: AC
  Administered 2015-04-23: 2 g via INTRAVENOUS

## 2015-04-23 SURGICAL SUPPLY — 5 items
CABLE SURGICAL S-101-97-12 (CABLE) ×2 IMPLANT
PACEMAKER ADAPTA DR ADDRL1 (Pacemaker) ×1 IMPLANT
PAD DEFIB LIFELINK (PAD) ×2 IMPLANT
PPM ADAPTA DR ADDRL1 (Pacemaker) ×2 IMPLANT
TRAY PACEMAKER INSERTION (PACKS) ×2 IMPLANT

## 2015-04-23 NOTE — Discharge Instructions (Signed)
Supplemental Discharge Instructions for  Pacemaker/Defibrillator Patients  Activity  No heavy lifting or vigorous activity with your right arm for 2-3 weeks.    DO wear your seatbelt, even if it crosses over the pacemaker site.  WOUND CARE - Keep the wound area clean and dry.  Remove the dressing the day after you return home (usually 48 hours after the procedure). - DO NOT SUBMERGE UNDER WATER UNTIL FULLY HEALED (no tub baths, hot tubs, swimming pools, etc.).  - You  may shower or take a sponge bath after the dressing is removed. DO NOT SOAK the area and do not allow the shower to directly spray on the site. - If you have staples, these will be removed in the office in 7-14 days. - If you have tape/steri-strips on your wound, these will fall off; do not pull them off prematurely.   - No bandage is needed on the site.  DO  NOT apply any creams, oils, or ointments to the wound area. - If you notice any drainage or discharge from the wound, any swelling, excessive redness or bruising at the site, or if you develop a fever > 101? F after you are discharged home, call the office at once.  Special Instructions - You are still able to use cellular telephones.  Avoid carrying your cellular phone near your device. - When traveling through airports, show security personnel your identification card to avoid being screened in the metal detectors.  - Avoid arc welding equipment, MRI testing (magnetic resonance imaging), TENS units (transcutaneous nerve stimulators).  Call the office for questions about other devices. - Avoid electrical appliances that are in poor condition or are not properly grounded. - Microwave ovens are safe to be near or to operate. Pacemaker Battery Change, Care After Refer to this sheet in the next few weeks. These instructions provide you with information on caring for yourself after your procedure. Your health care provider may also give you more specific instructions. Your  treatment has been planned according to current medical practices, but problems sometimes occur. Call your health care provider if you have any problems or questions after your procedure. WHAT TO EXPECT AFTER THE PROCEDURE After your procedure, it is typical to have the following sensations: Soreness at the pacemaker site. HOME CARE INSTRUCTIONS  Keep the incision clean and dry. Unless advised otherwise, you may shower beginning 48 hours after your procedure. For the first week after the replacement, avoid stretching motions that pull at the incision site, and avoid heavy exercise with the arm that is on the same side as the incision. Take medicines only as directed by your health care provider. Keep all follow-up visits as directed by your health care provider. SEEK MEDICAL CARE IF:  You have pain at the incision site that is not relieved by over-the-counter or prescription medicine. There is drainage or pus from the incision site. There is swelling larger than a lime at the incision site. You develop red streaking that extends above or below the incision site. You feel brief, intermittent palpitations, light-headedness, or any symptoms that you feel might be related to your heart. SEEK IMMEDIATE MEDICAL CARE IF:  You experience chest pain that is different than the pain at the pacemaker site. You experience shortness of breath. You have palpitations or irregular heartbeat. You have light-headedness that does not go away quickly. You faint. You have pain that gets worse and is not relieved by medicine.   This information is not intended to replace  advice given to you by your health care provider. Make sure you discuss any questions you have with your health care provider.   Document Released: 01/15/2013 Document Revised: 04/17/2014 Document Reviewed: 01/15/2013 Elsevier Interactive Patient Education [employer].

## 2015-04-23 NOTE — Interval H&P Note (Signed)
History and Physical Interval Note:  04/23/2015 9:41 AM  Robert Burns  has presented today for surgery, with the diagnosis of ERI  The various methods of treatment have been discussed with the patient and family. After consideration of risks, benefits and other options for treatment, the patient has consented to  Procedure(s): PPM Generator Changeout (N/A) as a surgical intervention .  The patient's history has been reviewed, patient examined, no change in status, stable for surgery.  I have reviewed the patient's chart and labs.  Questions were answered to the patient's satisfaction.     Denetra Formoso

## 2015-04-23 NOTE — H&P (View-Only) (Signed)
Cardiology Office Note Date: 04/21/2015  Patient ID: Robert Burns, Robert Burns Aug 18, 1940, MRN [id number] PCP: Thurman Coyer, MD Cardiologist: Dr. Gwenlyn Found, Dr. Sallyanne Kuster  Chief Complaint: PPM has reached ERI  History of Present Illness: Robert Burns is a 75 y.o. male with history of CAD with CABG in 2004 though more recently PCI of SVG to PDA with DES 02/01/15 and then staged PCI to SVG of the Diag also with DES 03/01/15, PVD with stenting left SFA in 2004 and tumor removal/graft SFA in 2012. He has PPM implanted 2007 for siinus node dysfunction, HTN, HLD, OSA, and DM. He comes in today feeling a little sluggish/no energy in the last few weeks,(likely when he went VVI), no dizziness, near syncope or syncope,  no CP, palpitations or SOB.    By the last note by Dr. Sallyanne Kuster: There is 99.1% atrial pacing but only 0.8% ventricular pacing. There is no atrial activity whatsoever when pacing this temporarily suspended and there is no immediate ventricular escape rhythm. He appears to be pacemaker dependent   Past Medical History  Diagnosis Date  . Hypertension   . Leg pain   . Hyperlipidemia   . CAD (coronary artery disease)     s/p CABG 06/2002; last nuc 07/18/11 normal EF and negative for ischemia; myoview 01/21/15 abnormal, cath 02/01/15 DES to prox SVG to PDA, subtotal occlusion of SVG to Diag to be treated later.  . SSS (sick sinus syndrome) ([place])     Medtronic Adapta, 10/13/05  . 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 Cancer (HCC)     Right Shoulder, Left Leg- BCC, SCC, AND MELANOMA  . Presence of permanent cardiac pacemaker   . OSA on CPAP   . Type II diabetes mellitus ([place])   . Arthritis     "all over"  . Urgency of urination     Past Surgical History  Procedure Laterality Date  .  Coronary artery bypass graft  06/2002    x5, LIMA-LAD;VG- Diag; seq VG- ramus & OM branch; VG-PDA  . Tonsillectomy and adenoidectomy    . Tumor excision Right ~ 2005    cancerous tumor removed from shoulder  . Tumor excision Right ~ 2000    benign tumor removed from under shoulder  . Knee surgery Right 1950's    "broke my lower leg; had to put pin in my knee to keep lower leg in place til it healed"  . Foot fracture surgery Left   . Femoral artery stent Left ~ 2014    "taken out of my leg; couldn' catorgorize what kind so the put it under all 3"; cataroziepitheloid hemanioendotheliomau  . Tumor excision Left     resection of Lt SFA wth interposition of Gore-Tex graft  . Cardiac catheterization N/A 02/01/2015    Procedure: Left Heart Cath and Coronary Angiography; Surgeon: Lorretta Harp, MD; Location: [facility]; Service: Cardiovascular; Laterality: N/A;  . Cardiac catheterization N/A 02/01/2015    Procedure: Coronary Stent Intervention; Surgeon: Lorretta Harp, MD; Location: [facility]; Service: Cardiovascular; Laterality: N/A;  . Insert / replace / remove pacemaker  10/13/05    right side, medtronic Adapta  . Knee hardware removal Right 1950's    "3-4 months after the insertion"  . Cardiac catheterization  06/2002    "just before bypass OR"  .  Fracture surgery    . Coronary angioplasty    . Cardiac catheterization N/A 03/01/2015    Procedure: Coronary Stent Intervention; Surgeon: Lorretta Harp, MD; Location: [facility]; Service: Cardiovascular; Laterality: N/A;    Current Outpatient Prescriptions  Medication Sig Dispense Refill  . acetaminophen (TYLENOL) 500 MG tablet Take 500 mg by mouth every 6 (six) hours as needed for moderate pain or headache.     . Albuterol Sulfate (PROAIR RESPICLICK) [id number] (90 BASE) MCG/ACT AEPB Inhale 2 puffs into the lungs 4 (four) times daily.    [name] amLODipine (NORVASC) 10 MG  tablet Take 10 mg by mouth daily.     [name] aspirin EC 81 MG tablet Take 81 mg by mouth daily.     . B Complex Vitamins (B COMPLEX-B12 PO) Take 1 tablet by mouth daily.     . cloNIDine (CATAPRES) 0.1 MG tablet Take 0.1 mg by mouth 2 (two) times daily.    . clopidogrel (PLAVIX) 75 MG tablet Take 1 tablet (75 mg total) by mouth daily with breakfast. 90 tablet 3  . Doxylamine Succinate, Sleep, (SLEEP AID PO) Take 1 tablet by mouth as needed (for sleep).     . gabapentin (NEURONTIN) 100 MG capsule Take 300 mg by mouth daily.     [name] glipiZIDE (GLUCOTROL) 5 MG tablet Take 5 mg by mouth daily.     [name] guaiFENesin-codeine (ROBITUSSIN AC) 100-10 MG/5ML syrup Take 10 mLs by mouth daily.    . imiquimod (ALDARA) 5 % cream Apply 1 application topically daily. Apply daily x 5 days then hold x 2 days then repeat.    . isosorbide mononitrate (IMDUR) 30 MG 24 hr tablet Take 1 tablet (30 mg total) by mouth daily. 30 tablet 11  . losartan-hydrochlorothiazide (HYZAAR) 100-25 MG per tablet Take 1 tablet by mouth daily.     [name] lovastatin (MEVACOR) 40 MG tablet Take 40 mg by mouth at bedtime.     . magnesium hydroxide (MILK OF MAGNESIA) 400 MG/5ML suspension Take 30 mLs by mouth as needed for moderate constipation or indigestion.     . metFORMIN (GLUCOPHAGE) 1000 MG tablet Take 1,000 mg by mouth 2 (two) times daily with a meal.     . metoprolol tartrate (LOPRESSOR) 25 MG tablet Take 25 mg by mouth 2 (two) times daily.     . Multiple Vitamin (MULTIVITAMIN) capsule Take 1 capsule by mouth daily.     . nitroGLYCERIN (NITROSTAT) 0.4 MG SL tablet Place 1 tablet (0.4 mg total) under the tongue every 5 (five) minutes as needed for chest pain. 25 tablet 3  . tamsulosin (FLOMAX) 0.4 MG CAPS capsule Take 0.4 mg by mouth daily.    . vitamin C (ASCORBIC ACID) 500 MG tablet Take 500 mg by mouth 2 (two) times daily.      No current facility-administered medications for this visit.     Allergies: Peanut-containing drug products; Ace inhibitors; Clonidine hcl; Coreg; Diltiazem hcl; Mobic; and Ramipril   Social History: The patient  reports that he quit smoking about 42 years ago. His smoking use included Cigarettes and Pipe. He quit after 5 years of use. He has never used smokeless tobacco. He reports that he does not drink alcohol or use illicit drugs.   Family History: The patient's family history includes Coronary artery disease in his mother; Diabetes in his father and sister; Heart attack in his father and mother; Heart disease in his father, mother, and sister; Hyperlipidemia in his father; Hypertension  in his mother and sister.  ROS: Please see the history of present illness.  All other systems are reviewed and otherwise negative.   PHYSICAL EXAM:  VS: 160/78, 183lbs, 5'8", 70bpm Well nourished, well developed, in no acute distress  HEENT: normocephalic, atraumatic  Neck: no JVD, carotid bruits or masses Cardiac: normal S1, S2; RRR; no significant murmurs, no rubs, or gallops Lungs: clear to auscultation bilaterally, no wheezing, rhonchi or rales  Abd: soft, nontender MS: no deformity or atrophy Ext: no edema  Skin: warm and dry, no rash Neuro: No gross deficits appreciated Psych: euthymic mood, full affect  PPM site is stable, no tethering or discomfort ( right chest)   EKG: Done today shows SR, V paced, PVC PPM check today with industry shows: device has switched to VVI, at ERI reached 03/27/15, <1% V pacing, single chamber hysteresis is programmed on at 50bpm.  intrinsic V sensing at 60bpm, one episode NSVT 15beats  03/01/15 PCI, Dr. Gwenlyn Found Procedures    Coronary Stent Intervention    Conclusion     Prox Graft lesion, 90% stenosed.  Origin to Prox Graft lesion, 99% stenosed. Post intervention, there is a 0% residual stenosis.  Dist Graft lesion, 95% stenosed. Post intervention, there is a 0% residual stenosis.   02/01/15:  LHC/PCI, Dr. Gwenlyn Found Procedures    Coronary Stent Intervention   Left Heart Cath and Coronary Angiography    Conclusion     Prox LAD to Mid LAD lesion, 100% stenosed.  Ost 2nd Diag lesion, 100% stenosed.  Prox RCA to Mid RCA lesion, 100% stenosed.  SVG was injected is normal in caliber.  There is severe disease in the graft.  95% proximal SVG to PDA  was injected is normal in caliber.  There is mild disease in the graft.  60% stenosis at the insertion of the LIMA to the LAD  Dist LAD lesion, 80% stenosed.  Insertion lesion, 60% stenosed.  SVG was injected is normal in caliber.  There is moderate focal disease in the graft.  70% stenosis in the SVG proximal to the OM insertion  Subtotally occluded diagonal branch vein graft at the aorta  Origin lesion, 99% stenosed.  Prox Graft lesion, 60% stenosed.  Prox Graft lesion, 95% stenosed. Post intervention, there is a 0% residual stenosis.   01/21/15: stress myoview Study Highlights     Nuclear stress EF: 43%.  The left ventricular ejection fraction is moderately decreased (30-44%).  Defect 1: There is a small defect of mild severity present in the mid anterolateral location.  Findings consistent with ischemia.  This is an intermediate risk study.  Intermediate risk lexiscan nuclear study demonstrating a small region of mild ischemia in the mid anterior-anterolateral wall with associated mild focal hypocontractility and EF 43%.       Recent Labs: 01/27/2015: TSH 0.964 03/02/2015: BUN 14; Creatinine, Ser 0.85; Hemoglobin 12.7*; Platelets 182; Potassium 3.3*; Sodium 142  No results found for requested labs within last 365 days.   CrCl cannot be calculated (Unknown ideal weight.).   Wt Readings from Last 3 Encounters:  03/15/15 182 lb 1.6 oz (82.6 kg)  03/02/15 174 lb 6.1 oz (79.1 kg)  02/23/15 181 lb 14.4 oz (82.509 kg)     Other studies reviewed: Additional studies/records reviewed today  include: summarized above  DEVICE information: MDT PPM, implanted 10/13/05 by Dr. Tami Ribas, Sinus node dysfunction, sinus arrest, follows with Dr. Sallyanne Kuster  ASSESSMENT AND PLAN:  1. PPM at [facility] d/w Dr. Sallyanne Kuster, Will plan for  generator change, scheduled for this Fridayy with Dr. Sallyanne Kuster, will proceed without interruption of his ASA/Plavix given his recent DES/PCI's, and stated no need for temp wire needed. Pt states understanding he is to continue his ASA and plavix without interruption, discussed risk/benefit of the generator change and he is agreeable to proceed.  2. CAD no CP Last intervention 03/01/15 and 02/01/15 On ASA/Plavix (counseled on medicines), nitrate, statin, BB Continue with Dr. Gwenlyn Found  3. PVD  c/w Dr. Gwenlyn Found  4. HTN  High here today, the patient states is unusual for him     follow  Disposition: pre-procedure labs today, f/u with wound check post generator change and Dr. Sallyanne Kuster post procedure.  Current medicines are reviewed at length with the patient today. The patient did not have any concerns regarding medicines.  Haywood Lasso, PA-C 04/21/2015 10:51 AM   [facility] [address] [place] [address] [phone] (office)  [phone] (fax)

## 2015-04-23 NOTE — Telephone Encounter (Signed)
-----   Message from [facility], [place] sent at 04/22/2015  3:15 PM EST ----- Edwena Blow,  Please let the patient know his labs look ok for his procedure tomorrow, I am also forwarding to Dr.Croitoru.  Thank, Joseph Art

## 2015-04-23 NOTE — Op Note (Signed)
Procedure report  Procedure performed:  1. Dual chamber pacemaker generator changeout   Reason for procedure:  1. Device generator at elective replacement interval  2. Symptomatic bradycardia due to sinus node dysfunction. 3. Pacemaker syndrome Procedure performed by:  Sanda Klein, MD  Complications:  None  Estimated blood loss:  <5 mL  Medications administered during procedure:  Ancef 2 g intravenously, lidocaine 1% 30 mL locally,  Device details:   New Generator Medtronic Adapta L model number ADDRL1, serial number [id number] Right atrial lead (chronic) Medtronic [id number], serial [id number] (implanted 10/13/2005) Right ventricular lead (chronic)  Medtronic [id number], serial number [id number] (implanted 10/13/2005)  Explanted generator Medtronic Adapta,  model number [id number]   Procedure details:  After the risks and benefits of the procedure were discussed the patient provided informed consent. She was brought to the cardiac catheter lab in the fasting state. The patient was prepped and draped in usual sterile fashion. Local anesthesia with 1% lidocaine was administered to to the left infraclavicular area. A 5-6cm horizontal incision was made parallel with and 2-3 cm caudal to the left clavicle, in the area of an old scar. Using minimal electrocautery and mostly sharp and blunt dissection the prepectoral pocket was opened carefully to avoid injury to the loops of chronic leads. Extensive dissection was not necessary. The device was explanted. The pocket was carefully inspected for hemostasis and flushed with copious amounts of antibiotic solution.  The leads were disconnected from the old generator and testing of the lead parameters later showed excellent values. The new generator was connected to the chronic leads, with appropriate pacing noted.   The entire system was then carefully inserted in the pocket with care been taking that the leads and device assumed a comfortable position  without pressure on the incision. Great care was taken that the leads be located deep to the generator. The pocket was then closed in layers using 2 layers of 2-0 Vicryl and cutaneous staples after which a sterile dressing was applied.   At the end of the procedure the following lead parameters were encountered:   Right atrial lead sensed P waves 1 mV, impedance 441 ohms, threshold 1.0 at 0.4 ms pulse width.  Right ventricular lead sensed R waves  15.68 mV, impedance 638 ohms, threshold 1.75 at 0.4 ms pulse width.  Sanda Klein, MD, [facility] [phone] office [phone] pager

## 2015-04-26 ENCOUNTER — Encounter (HOSPITAL_COMMUNITY): Payer: Self-pay | Admitting: Cardiovascular Disease

## 2015-04-26 MED FILL — Gentamicin Sulfate Inj 40 MG/ML: INTRAMUSCULAR | Qty: 2 | Status: AC

## 2015-04-26 MED FILL — Sodium Chloride Irrigation Soln 0.9%: Qty: 500 | Status: AC

## 2015-04-26 MED FILL — Lidocaine HCl Local Preservative Free (PF) Inj 1%: INTRAMUSCULAR | Qty: 30 | Status: AC

## 2015-05-02 ENCOUNTER — Other Ambulatory Visit: Payer: Self-pay

## 2015-05-02 DIAGNOSIS — I739 Peripheral vascular disease, unspecified: Secondary | ICD-10-CM

## 2015-05-03 ENCOUNTER — Ambulatory Visit (INDEPENDENT_AMBULATORY_CARE_PROVIDER_SITE_OTHER): Payer: Medicare HMO | Admitting: *Deleted

## 2015-05-03 ENCOUNTER — Encounter: Payer: Self-pay | Admitting: Cardiovascular Disease

## 2015-05-03 DIAGNOSIS — I495 Sick sinus syndrome: Secondary | ICD-10-CM

## 2015-05-03 LAB — CUP PACEART INCLINIC DEVICE CHECK
Battery Impedance: 100 Ohm
Battery Voltage: 2.8 V
Brady Statistic AP VP Percent: 1 %
Brady Statistic AS VS Percent: 0 %
Implantable Lead Implant Date: 20070706
Implantable Lead Location: 753859
Implantable Lead Location: 753860
Implantable Lead Model: 5076
Lead Channel Impedance Value: 442 Ohm
Lead Channel Impedance Value: 646 Ohm
Lead Channel Pacing Threshold Amplitude: 1 V
Lead Channel Pacing Threshold Amplitude: 1 V
Lead Channel Pacing Threshold Pulse Width: 0.4 ms
Lead Channel Pacing Threshold Pulse Width: 0.4 ms
Lead Channel Pacing Threshold Pulse Width: 0.4 ms
Lead Channel Setting Pacing Amplitude: 2 V
Lead Channel Setting Pacing Pulse Width: 0.4 ms
MDC IDC LEAD IMPLANT DT: 20070706
MDC IDC MSMT BATTERY REMAINING LONGEVITY: 147 mo
MDC IDC MSMT LEADCHNL RA PACING THRESHOLD PULSEWIDTH: 0.4 ms
MDC IDC MSMT LEADCHNL RV PACING THRESHOLD AMPLITUDE: 1.75 V
MDC IDC MSMT LEADCHNL RV PACING THRESHOLD AMPLITUDE: 1.75 V
MDC IDC MSMT LEADCHNL RV SENSING INTR AMPL: 15.67 mV
MDC IDC SESS DTM: 20170123173745
MDC IDC SET LEADCHNL RV PACING AMPLITUDE: 3.5 V
MDC IDC SET LEADCHNL RV SENSING SENSITIVITY: 5.6 mV
MDC IDC STAT BRADY AP VS PERCENT: 99 %
MDC IDC STAT BRADY AS VP PERCENT: 0 %

## 2015-05-03 NOTE — Progress Notes (Signed)
Wound check appointment. Incision dressed with betadine then staples removed. Wound without redness or edema. Incision edges approximated, wound well healed. Normal device function. Thresholds, sensing, and impedances consistent with implant measurements. Device programmed at appropriate safety margins. Histogram distribution appropriate for patient and level of activity. No mode switches or high ventricular rates noted. Patient educated about wound care and arm mobility. ROV on 4/17 via [facility] and with [facility] in 6 months.

## 2015-05-26 ENCOUNTER — Encounter: Payer: Self-pay | Admitting: Cardiovascular Disease

## 2015-05-26 ENCOUNTER — Ambulatory Visit (INDEPENDENT_AMBULATORY_CARE_PROVIDER_SITE_OTHER): Payer: Medicare HMO | Admitting: Cardiovascular Disease

## 2015-05-26 VITALS — BP 146/78 | HR 90 | Ht 68.0 in | Wt 185.0 lb

## 2015-05-26 DIAGNOSIS — I10 Essential (primary) hypertension: Secondary | ICD-10-CM | POA: Diagnosis not present

## 2015-05-26 DIAGNOSIS — I2510 Atherosclerotic heart disease of native coronary artery without angina pectoris: Secondary | ICD-10-CM | POA: Diagnosis not present

## 2015-05-26 DIAGNOSIS — E785 Hyperlipidemia, unspecified: Secondary | ICD-10-CM | POA: Diagnosis not present

## 2015-05-26 DIAGNOSIS — I2583 Coronary atherosclerosis due to lipid rich plaque: Secondary | ICD-10-CM

## 2015-05-26 DIAGNOSIS — I495 Sick sinus syndrome: Secondary | ICD-10-CM

## 2015-05-26 NOTE — Assessment & Plan Note (Signed)
History of hyperlipidemia currently not on a statin drug followed by his PCP

## 2015-05-26 NOTE — Assessment & Plan Note (Signed)
History of sick sinus syndrome status post permanent transvenous pacemaker placed in November 2007 followed by Dr. Donnetta Hail. He had a generator change last month for end-of-life.

## 2015-05-26 NOTE — Progress Notes (Signed)
05/26/2015 [place]   1941/02/03  [id number]  Primary Physician Thurman Coyer, MD Primary Cardiologist: Lorretta Harp MD [name]   HPI:  The patient is a very pleasant 75 year old, married Caucasian male, father of 82, grandfather to 3 grandchildren whose wife [name] who is also a patient of mine. I last saw him 1at the time of his procedure 03/01/15... He has a history of CAD status post coronary artery bypass grafting March 2004 with a LIMA to his LAD, a vein to a diagonal branch, a sequential vein to a ramus and OM branch, as well as a vein to the PDA. Last functional study performed July 28, 2011, was entirely normal. 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 He had a pacemaker placed for sick sinus syndrome November 2008 followed by Dr. Sallyanne Kuster. He has obstructive sleep apnea on CPAP. He complain of left thigh pain and I angiogram'd him revealing patent arteries though he did have a space-occupying lesion which was removed by Dr. Kellie Simmering with placement of an interposition 6-mm Gore-Tex graft. The pathology was uncertain. 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 Dr. Baxter Flattery follows his lipid profile.Since I saw him back 11/07/12 he has done well. Followup Dopplers performed in our office 09/27/12 revealed a high-grade lesion in the distal right SFA with an ABI of 0.3. His left ABI 1.1 without obstructive disease. His most recent lower extremity Doppler Dopplers performed 9/32/16 revealed a right ABI 0.82 And a left ABI of 0.89. Over the last 3 months he's noticed anginal chest pain occurring both at rest and with exertion with left upper extremity radiation. He also complains of left lower extremity discomfort. to moderate anterolateral ischemia. Because of ongoing chest pain and a Myoview that showed anterolateral ischemia and he underwent cardiac catheterization on 02/01/15 revealing high grade segmental proximal right SVG disease  and subtotally occluded diagonal branch SVG. He underwent stenting of his RCA SVG successfully. He does have continued chest pain although somewhat improved since his last procedure.  I brought him back for staged diagonal branch SVG intervention on 03/01/15. This was successful and since that time he denies chest pain or shortness of breath. He underwent a generator change by Dr. Sallyanne Kuster in January for end-of-life of his Medtronic pacemaker.   Current Outpatient Prescriptions  Medication Sig Dispense Refill  . acetaminophen (TYLENOL) 500 MG tablet Take 500 mg by mouth every 6 (six) hours as needed for moderate pain or headache.     . Albuterol Sulfate (PROAIR RESPICLICK) 123XX123 (90 BASE) MCG/ACT AEPB Inhale 2 puffs into the lungs 4 (four) times daily.    [name] amLODipine (NORVASC) 10 MG tablet Take 10 mg by mouth daily.      [name] aspirin EC 81 MG tablet Take 81 mg by mouth daily.      . B Complex Vitamins (B COMPLEX-B12 PO) Take 1 tablet by mouth daily.     . cloNIDine (CATAPRES) 0.1 MG tablet Take 0.1 mg by mouth 2 (two) times daily.    . clopidogrel (PLAVIX) 75 MG tablet Take 1 tablet (75 mg total) by mouth daily with breakfast. 90 tablet 3  . Doxylamine Succinate, Sleep, (SLEEP AID PO) Take 1 tablet by mouth as needed (for sleep).     . gabapentin (NEURONTIN) 100 MG capsule Take 300 mg by mouth daily.     [name] glipiZIDE (GLUCOTROL) 5 MG tablet Take 5 mg by mouth daily.      [name]  guaiFENesin-codeine (ROBITUSSIN AC) 100-10 MG/5ML syrup Take 10 mLs by mouth daily as needed for cough.     . imiquimod (ALDARA) 5 % cream Apply 1 application topically daily. Apply daily x 5 days then hold x 2 days then repeat.    . isosorbide mononitrate (IMDUR) 30 MG 24 hr tablet Take 1 tablet (30 mg total) by mouth daily. 30 tablet 11  . losartan-hydrochlorothiazide (HYZAAR) 100-25 MG per tablet Take 1 tablet by mouth daily.      [name] lovastatin (MEVACOR) 40 MG tablet Take 40 mg by mouth at bedtime.      . magnesium hydroxide (MILK  OF MAGNESIA) 400 MG/5ML suspension Take 30 mLs by mouth as needed for moderate constipation or indigestion.     . metFORMIN (GLUCOPHAGE) 1000 MG tablet Take 1,000 mg by mouth 2 (two) times daily with a meal.      . metoprolol tartrate (LOPRESSOR) 25 MG tablet Take 25 mg by mouth 2 (two) times daily.      . Multiple Vitamin (MULTIVITAMIN) capsule Take 1 capsule by mouth daily.      . nitroGLYCERIN (NITROSTAT) 0.4 MG SL tablet Place 1 tablet (0.4 mg total) under the tongue every 5 (five) minutes as needed for chest pain. 25 tablet 3  . tamsulosin (FLOMAX) 0.4 MG CAPS capsule Take 0.4 mg by mouth daily.    . vitamin C (ASCORBIC ACID) 500 MG tablet Take 500 mg by mouth 2 (two) times daily.       No current facility-administered medications for this visit.    Allergies  Allergen Reactions  . Peanut-Containing Drug Products Anaphylaxis and Other (See Comments)    Tongue swelling is severe  . Ace Inhibitors Other (See Comments)    Cough  . Clonidine Hcl Other (See Comments)    Patch only - skin irritation  . Coreg [Carvedilol] Itching  . Diltiazem Hcl Other (See Comments)    Unknown  . Mobic [Meloxicam] Other (See Comments)    GI upset  . Ramipril Other (See Comments)    Unknown    Social History   Social History  . Marital Status: Married    Spouse Name: N/A  . Number of Children: N/A  . Years of Education: N/A   Occupational History  . Not on file.   Social History Main Topics  . Smoking status: Former Smoker -- 5 years    Types: Cigarettes, Pipe    Quit date: 04/10/1973  . Smokeless tobacco: Never Used  . Alcohol Use: No  . Drug Use: No  . Sexual Activity: No   Other Topics Concern  . Not on file   Social History Narrative     Review of Systems: General: negative for chills, fever, night sweats or weight changes.  Cardiovascular: negative for chest pain, dyspnea on exertion, edema, orthopnea, palpitations, paroxysmal nocturnal dyspnea or shortness of  breath Dermatological: negative for rash Respiratory: negative for cough or wheezing Urologic: negative for hematuria Abdominal: negative for nausea, vomiting, diarrhea, bright red blood per rectum, melena, or hematemesis Neurologic: negative for visual changes, syncope, or dizziness All other systems reviewed and are otherwise negative except as noted above.    Blood pressure 146/78, pulse 90, height 5\' 8"  (1.727 m), weight 185 lb (83.915 kg).  General appearance: alert and no distress Neck: no adenopathy, no carotid bruit, no JVD, supple, symmetrical, trachea midline and thyroid not enlarged, symmetric, no tenderness/mass/nodules Lungs: clear to auscultation bilaterally Heart: regular rate and rhythm, S1, S2 normal, no  murmur, click, rub or gallop Extremities: extremities normal, atraumatic, no cyanosis or edema  EKG not performed today  ASSESSMENT AND PLAN:   SSS (sick sinus syndrome), medtronic adapta History of sick sinus syndrome status post permanent transvenous pacemaker placed in November 2007 followed by Dr. Donnetta Hail. He had a generator change last month for end-of-life.  Essential hypertension History of hypertension blood pressure measured at 146/78. He is on clonidine, losartan, hydrochlorothiazide and Lopressor. 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 He had an interposition graft placed by Dr. Kellie Simmering because of a "space-occupying lesion. Follow-up Dopplers have shown a slight decline in his left ABI with a high-frequency signal in his left common femoral artery and angiography confirmed an anastomotic lesion although the patient denies claudication.  CAD -CABG 2004 LIMA-LAD, VG-Dx, seq VG -RI & OM branch; VG-PDA  History of CAD status post coronary artery bypass grafting in 2004 with a LIMA to his LAD,  vein to diagonal branch, sequential vein to ramus, obtuse marginal branch and PDA. Because of worrisome chest pain he underwent outpatient Myoview 01/21/15 and was admitted for cath on 02/01/15 revealing high-grade RCA SVG disease which was stented using a drug-eluting stent. Residual diagonal branch vein graft disease and was brought back on 03/01/15 for staged PCI which was successful. He no longer has chest pain.  Hyperlipidemia History of hyperlipidemia currently not on a statin drug followed by his PCP      Lorretta Harp MD [facility], [facility] 05/26/2015 2:42 PM

## 2015-05-26 NOTE — Assessment & Plan Note (Signed)
History of CAD status post coronary artery bypass grafting in 2004 with a LIMA to his LAD, vein to diagonal branch, sequential vein to ramus, obtuse marginal branch and PDA. Because of worrisome chest pain he underwent outpatient Myoview 01/21/15 and was admitted for cath on 02/01/15 revealing high-grade RCA SVG disease which was stented using a drug-eluting stent. Residual diagonal branch vein graft disease and was brought back on 03/01/15 for staged PCI which was successful. He no longer has chest pain.

## 2015-05-26 NOTE — Assessment & Plan Note (Signed)
History of hypertension blood pressure measured at 146/78. He is on clonidine, losartan, hydrochlorothiazide and Lopressor. Continue current meds at current dosing

## 2015-05-26 NOTE — Patient Instructions (Signed)

## 2015-05-26 NOTE — Assessment & Plan Note (Signed)
History of peripheral arterial disease status post left SFA PTA and stenting by myself 10/30/02. He had an interposition graft placed by Dr. Kellie Simmering because of a "space-occupying lesion. Follow-up Dopplers have shown a slight decline in his left ABI with a high-frequency signal in his left common femoral artery and angiography confirmed an anastomotic lesion although the patient denies claudication.

## 2015-06-01 ENCOUNTER — Encounter: Payer: Medicare HMO | Admitting: Cardiovascular Disease

## 2015-07-26 ENCOUNTER — Telehealth: Payer: Self-pay | Admitting: Cardiology

## 2015-07-26 ENCOUNTER — Ambulatory Visit (INDEPENDENT_AMBULATORY_CARE_PROVIDER_SITE_OTHER): Payer: Medicare HMO | Admitting: *Deleted

## 2015-07-26 DIAGNOSIS — I495 Sick sinus syndrome: Secondary | ICD-10-CM

## 2015-07-26 NOTE — Telephone Encounter (Signed)
Confirmed remote transmission w/ pt wife.   

## 2015-07-30 NOTE — Progress Notes (Signed)
Remote pacemaker transmission.   

## 2015-09-01 ENCOUNTER — Encounter: Payer: Self-pay | Admitting: Cardiology

## 2015-09-01 LAB — CUP PACEART REMOTE DEVICE CHECK
Battery Impedance: 100 Ohm
Battery Voltage: 2.8 V
Brady Statistic AP VS Percent: 99 %
Brady Statistic AS VP Percent: 0 %
Date Time Interrogation Session: 20170419154002
Implantable Lead Implant Date: 20070706
Implantable Lead Location: 753859
Implantable Lead Model: 5076
Implantable Lead Model: 5092
Lead Channel Impedance Value: 480 Ohm
Lead Channel Impedance Value: 657 Ohm
Lead Channel Pacing Threshold Pulse Width: 0.4 ms
Lead Channel Setting Pacing Amplitude: 3.5 V
MDC IDC LEAD IMPLANT DT: 20070706
MDC IDC LEAD LOCATION: 753860
MDC IDC MSMT BATTERY REMAINING LONGEVITY: 150 mo
MDC IDC MSMT LEADCHNL RA PACING THRESHOLD AMPLITUDE: 0.875 V
MDC IDC MSMT LEADCHNL RV PACING THRESHOLD AMPLITUDE: 1.75 V
MDC IDC MSMT LEADCHNL RV PACING THRESHOLD PULSEWIDTH: 0.4 ms
MDC IDC MSMT LEADCHNL RV SENSING INTR AMPL: 16 mV
MDC IDC SET LEADCHNL RA PACING AMPLITUDE: 1.875
MDC IDC SET LEADCHNL RV PACING PULSEWIDTH: 0.4 ms
MDC IDC SET LEADCHNL RV SENSING SENSITIVITY: 5.6 mV
MDC IDC STAT BRADY AP VP PERCENT: 1 %
MDC IDC STAT BRADY AS VS PERCENT: 1 %

## 2015-11-03 ENCOUNTER — Other Ambulatory Visit: Payer: Self-pay | Admitting: Cardiovascular Disease

## 2015-11-03 ENCOUNTER — Ambulatory Visit: Payer: Medicare HMO | Admitting: Cardiovascular Disease

## 2015-11-03 DIAGNOSIS — I739 Peripheral vascular disease, unspecified: Secondary | ICD-10-CM

## 2015-11-10 ENCOUNTER — Encounter: Payer: Self-pay | Admitting: Cardiovascular Disease

## 2015-11-10 ENCOUNTER — Other Ambulatory Visit: Payer: Self-pay | Admitting: Cardiovascular Disease

## 2015-11-10 ENCOUNTER — Ambulatory Visit (HOSPITAL_COMMUNITY)
Admission: RE | Admit: 2015-11-10 | Discharge: 2015-11-10 | Disposition: A | Discharge status: Home / Self Care | Payer: Medicare HMO | Source: Ambulatory Visit | Attending: Cardiovascular Disease | Admitting: Cardiovascular Disease

## 2015-11-10 ENCOUNTER — Other Ambulatory Visit: Payer: Self-pay | Admitting: *Deleted

## 2015-11-10 ENCOUNTER — Ambulatory Visit (INDEPENDENT_AMBULATORY_CARE_PROVIDER_SITE_OTHER): Payer: Medicare HMO | Admitting: Cardiovascular Disease

## 2015-11-10 VITALS — BP 134/76 | HR 94 | Ht 68.0 in | Wt 176.0 lb

## 2015-11-10 DIAGNOSIS — Z01818 Encounter for other preprocedural examination: Secondary | ICD-10-CM

## 2015-11-10 DIAGNOSIS — I10 Essential (primary) hypertension: Secondary | ICD-10-CM | POA: Insufficient documentation

## 2015-11-10 DIAGNOSIS — Z79899 Other long term (current) drug therapy: Secondary | ICD-10-CM

## 2015-11-10 DIAGNOSIS — I708 Atherosclerosis of other arteries: Secondary | ICD-10-CM | POA: Diagnosis not present

## 2015-11-10 DIAGNOSIS — E785 Hyperlipidemia, unspecified: Secondary | ICD-10-CM | POA: Diagnosis not present

## 2015-11-10 DIAGNOSIS — Z01810 Encounter for preprocedural cardiovascular examination: Secondary | ICD-10-CM

## 2015-11-10 DIAGNOSIS — D689 Coagulation defect, unspecified: Secondary | ICD-10-CM

## 2015-11-10 DIAGNOSIS — I739 Peripheral vascular disease, unspecified: Secondary | ICD-10-CM | POA: Diagnosis not present

## 2015-11-10 DIAGNOSIS — I2510 Atherosclerotic heart disease of native coronary artery without angina pectoris: Secondary | ICD-10-CM

## 2015-11-10 DIAGNOSIS — R938 Abnormal findings on diagnostic imaging of other specified body structures: Secondary | ICD-10-CM | POA: Insufficient documentation

## 2015-11-10 DIAGNOSIS — G4733 Obstructive sleep apnea (adult) (pediatric): Secondary | ICD-10-CM | POA: Insufficient documentation

## 2015-11-10 DIAGNOSIS — E119 Type 2 diabetes mellitus without complications: Secondary | ICD-10-CM | POA: Diagnosis not present

## 2015-11-10 DIAGNOSIS — I700 Atherosclerosis of aorta: Secondary | ICD-10-CM

## 2015-11-10 DIAGNOSIS — Z9861 Coronary angioplasty status: Secondary | ICD-10-CM

## 2015-11-10 DIAGNOSIS — I70212 Atherosclerosis of native arteries of extremities with intermittent claudication, left leg: Secondary | ICD-10-CM

## 2015-11-10 NOTE — Patient Instructions (Signed)
Medication Instructions:  Your physician recommends that you continue on your current medications as directed. Please refer to the Current Medication list given to you today.   Testing/Procedures: Dr. Berry has ordered a peripheral angiogram to be done at [facility].  This procedure is going to look at the bloodflow in your lower extremities.  If Dr. Berry is able to open up the arteries, you will have to spend one night in the hospital.  If he is not able to open the arteries, you will be able to go home that same day.  SCHEDULE FOR AUGUST 17.  After the procedure, you will not be allowed to drive for 3 days or push, pull, or lift anything greater than 10 lbs for one week.    You will be required to have the following tests prior to the procedure:  1. Blood work-the blood work can be done no more than 14 days prior to the procedure.  It can be done at any [facility] lab.  There is one downstairs on the first floor of this building and one in the [facility] building ([address], [address])     *REPS  NONE PER DR BERRY  Puncture site RIGHT GROIN   If you need a refill on your cardiac medications before your next appointment, please call your pharmacy.   

## 2015-11-10 NOTE — Assessment & Plan Note (Addendum)
History of peripheral arterial disease status post left SFA PTA and stenting by myself 10/30/02. He did have a space-occupying lesion in his left groin relatively had interposition 6 mm Gore-Tex graft placed by Dr. Kellie Simmering. At the time of his last cath 02/01/15 I did demonstrate a high-grade lesion in his left SFA just proximal to graft insertion over that time he denied claudication. Over the last 3 months she's noticed worsening left calf claudication. Dopplers performed today showed decline in his ABI from 0.87 to .59 with an occluded left SFA which is new. I will arrange for him to undergo angiography and potential intervention in 2 weeks.

## 2015-11-10 NOTE — Assessment & Plan Note (Signed)
History of hyperlipidemia on statin therapy by his PCP

## 2015-11-10 NOTE — Assessment & Plan Note (Signed)
History of hypertension blood pressure measured 134/76. He is on amlodipine, clonidine, losartan and hydrochlorothiazide. Continue current meds at current dosing

## 2015-11-10 NOTE — Assessment & Plan Note (Signed)
History of CAD status post coronary artery bypass grafting March 2004 with LIMA to his LAD, vein to the diagonal branch, sequential vein to the ramus and OM branch as well as the PDA. Because of chest pain Myoview stress test was performed showed anterolateral ischemia2 cardiac catheterization 02/01/15 revealing high grade segmental proximal right finger graft stenosis and a subtotally occluded diagonal branch vein graft. He underwent RCA vein graft intervention successfully. I brought him back and states his diagonal vein graft intervention. Since that time has remained pain-free.

## 2015-11-10 NOTE — Progress Notes (Signed)
11/10/2015 [place]   Apr 26, 1940  [id number]  Primary Physician Thurman Coyer, MD Primary Cardiologist: Lorretta Harp MD Robert Burns  HPI:  The patient is a very pleasant 75 year old, married Caucasian male, father of 5, grandfather to 3 grandchildren whose wife Robert Burns who is also a patient of mine. I last saw him in the office 05/26/15... He has a history of CAD status post coronary artery bypass grafting March 2004 with a LIMA to his LAD, a vein to a diagonal branch, a sequential vein to a ramus and OM branch, as well as a vein to the PDA. Last functional study performed July 28, 2011, was entirely normal. 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 He had a pacemaker placed for sick sinus syndrome November 2008 followed by Dr. Sallyanne Kuster. He has obstructive sleep apnea on CPAP. He complain of left thigh pain and I angiogram'd him revealing patent arteries though he did have a space-occupying lesion which was removed by Dr. Kellie Simmering with placement of an interposition 6-mm Gore-Tex graft. The pathology was uncertain. 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 Dr. Baxter Flattery follows his lipid profile.Since I saw him back 11/07/12 he has done well. Followup Dopplers performed in our office 09/27/12 revealed a high-grade lesion in the distal right SFA with an ABI of 0.3. His left ABI 1.1 without obstructive disease. His most recent lower extremity Doppler Dopplers performed 9/32/16 revealed a right ABI 0.82 And a left ABI of 0.89. Over the last 3 months he's noticed anginal chest pain occurring both at rest and with exertion with left upper extremity radiation. He also complains of left lower extremity discomfort. to moderate anterolateral ischemia. Because of ongoing chest pain and a Myoview that showed anterolateral ischemia and he underwent cardiac catheterization on 02/01/15 revealing high grade segmental proximal right SVG disease and subtotally  occluded diagonal branch SVG. He underwent stenting of his RCA SVG successfully. He does have continued chest pain although somewhat improved since his last procedure.  I brought him back for staged diagonal branch SVG intervention on 03/01/15. This was successful and since that time he denies chest pain or shortness of breath. He underwent a generator change by Dr. Sallyanne Kuster in January for end-of-life of his Medtronic pacemaker. Since I saw him 6 months ago he developed new left calf claudication of the last 3 months. Lower extremity arterial Doppler studies performed today revealed a decline in his left ABI from 0.87 down to 0.59 with an occluded left SFA that is a new finding.   Current Outpatient Prescriptions  Medication Sig Dispense Refill  . acetaminophen (TYLENOL) 500 MG tablet Take 500 mg by mouth every 6 (six) hours as needed for moderate pain or headache.     . Albuterol Sulfate (PROAIR RESPICLICK) 123XX123 (90 BASE) MCG/ACT AEPB Inhale 2 puffs into the lungs 4 (four) times daily.    [name] amLODipine (NORVASC) 10 MG tablet Take 10 mg by mouth daily.      [name] aspirin EC 81 MG tablet Take 81 mg by mouth daily.      . B Complex Vitamins (B COMPLEX-B12 PO) Take 1 tablet by mouth daily.     . cloNIDine (CATAPRES) 0.1 MG tablet Take 0.1 mg by mouth 2 (two) times daily.    . clopidogrel (PLAVIX) 75 MG tablet Take 1 tablet (75 mg total) by mouth daily with breakfast. 90 tablet 3  . Doxylamine Succinate, Sleep, (SLEEP AID  PO) Take 1 tablet by mouth as needed (for sleep).     . gabapentin (NEURONTIN) 100 MG capsule Take 300 mg by mouth daily.     [name] glipiZIDE (GLUCOTROL) 5 MG tablet Take 5 mg by mouth daily.      [name] guaiFENesin-codeine (ROBITUSSIN AC) 100-10 MG/5ML syrup Take 10 mLs by mouth daily as needed for cough.     . imiquimod (ALDARA) 5 % cream Apply 1 application topically daily. Apply daily x 5 days then hold x 2 days then repeat.    . isosorbide mononitrate (IMDUR) 30 MG 24 hr tablet Take 1 tablet (30  mg total) by mouth daily. 30 tablet 11  . losartan-hydrochlorothiazide (HYZAAR) 100-25 MG per tablet Take 1 tablet by mouth daily.      [name] lovastatin (MEVACOR) 40 MG tablet Take 40 mg by mouth at bedtime.      . magnesium hydroxide (MILK OF MAGNESIA) 400 MG/5ML suspension Take 30 mLs by mouth as needed for moderate constipation or indigestion.     . metFORMIN (GLUCOPHAGE) 1000 MG tablet Take 1,000 mg by mouth 2 (two) times daily with a meal.      . metoprolol tartrate (LOPRESSOR) 25 MG tablet Take 25 mg by mouth 2 (two) times daily.      . Multiple Vitamin (MULTIVITAMIN) capsule Take 1 capsule by mouth daily.      . nitroGLYCERIN (NITROSTAT) 0.4 MG SL tablet Place 1 tablet (0.4 mg total) under the tongue every 5 (five) minutes as needed for chest pain. 25 tablet 3  . tamsulosin (FLOMAX) 0.4 MG CAPS capsule Take 0.4 mg by mouth daily.    . vitamin C (ASCORBIC ACID) 500 MG tablet Take 500 mg by mouth 2 (two) times daily.       No current facility-administered medications for this visit.     Allergies  Allergen Reactions  . Peanut-Containing Drug Products Anaphylaxis and Other (See Comments)    Tongue swelling is severe  . Ace Inhibitors Other (See Comments)    Cough  . Clonidine Hcl Other (See Comments)    Patch only - skin irritation  . Coreg [Carvedilol] Itching  . Diltiazem Hcl Other (See Comments)    Unknown  . Mobic [Meloxicam] Other (See Comments)    GI upset  . Ramipril Other (See Comments)    Unknown    Social History   Social History  . Marital status: Married    Spouse name: N/A  . Number of children: N/A  . Years of education: N/A   Occupational History  . Not on file.   Social History Main Topics  . Smoking status: Former Smoker    Years: 5.00    Types: Cigarettes, Pipe    Quit date: 04/10/1973  . Smokeless tobacco: Never Used  . Alcohol use No  . Drug use: No  . Sexual activity: No   Other Topics Concern  . Not on file   Social History Narrative  . No  narrative on file     Review of Systems: General: negative for chills, fever, night sweats or weight changes.  Cardiovascular: negative for chest pain, dyspnea on exertion, edema, orthopnea, palpitations, paroxysmal nocturnal dyspnea or shortness of breath Dermatological: negative for rash Respiratory: negative for cough or wheezing Urologic: negative for hematuria Abdominal: negative for nausea, vomiting, diarrhea, bright red blood per rectum, melena, or hematemesis Neurologic: negative for visual changes, syncope, or dizziness All other systems reviewed and are otherwise negative except as noted above.  Blood pressure 134/76, pulse 94, height 5\' 8"  (1.727 m), weight 176 lb (79.8 kg).  General appearance: alert and no distress Neck: no adenopathy, no carotid bruit, no JVD, supple, symmetrical, trachea midline and thyroid not enlarged, symmetric, no tenderness/mass/nodules Lungs: clear to auscultation bilaterally Heart: regular rate and rhythm, S1, S2 normal, no murmur, click, rub or gallop Extremities: extremities normal, atraumatic, no cyanosis or edema  EKG not performed today  ASSESSMENT AND PLAN:   SSS (sick sinus syndrome), medtronic adapta History of sick sinus syndrome status post permanent  transvenous pacemaker implantation (Medtronic Adapta) 10/13/05 followed by Dr. Sallyanne Kuster.  Essential hypertension History of hypertension blood pressure measured 134/76. He is on amlodipine, clonidine, losartan and hydrochlorothiazide. 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 He did have a space-occupying lesion in his left groin relatively had interposition 6 mm Gore-Tex graft placed by Dr. Kellie Simmering. At the time of his last cath 02/01/15 I did demonstrate a high-grade lesion in his left SFA just proximal to graft  insertion over that time he denied claudication. Over the last 3 months she's noticed worsening left calf claudication. Dopplers performed today showed decline in his ABI from 0.87 to .59 with an occluded left SFA which is new. I will arrange for him to undergo angiography and potential intervention in 2 weeks.  Hyperlipidemia History of hyperlipidemia on statin therapy by his PCP  CAD S/P SVG-PDA DES 02/01/15, followed by staged SVG-Dx DES 03/01/15 History of CAD status post coronary artery bypass grafting March 2004 with LIMA to his LAD, vein to the diagonal branch, sequential vein to the ramus and OM branch as well as the PDA. Because of chest pain Myoview stress test was performed showed anterolateral ischemia2 cardiac catheterization 02/01/15 revealing high grade segmental proximal right finger graft stenosis and a subtotally occluded diagonal branch vein graft. He underwent RCA vein graft intervention successfully. I brought him back and states his diagonal vein graft intervention. Since that time has remained pain-free.      Lorretta Harp MD FACP,FACC,FAHA, FSCAI 11/10/2015 2:00 PM

## 2015-11-10 NOTE — Assessment & Plan Note (Addendum)
History of sick sinus syndrome status post permanent  transvenous pacemaker implantation (Medtronic Adapta) 10/13/05 followed by Dr. Sallyanne Kuster.

## 2015-11-15 LAB — CBC WITH DIFFERENTIAL/PLATELET
Basophils Absolute: 0 cells/uL (ref 0–200)
Basophils Relative: 0 %
EOS PCT: 2 %
Eosinophils Absolute: 182 cells/uL (ref 15–500)
HCT: 41.6 % (ref 38.5–50.0)
Hemoglobin: 13.7 g/dL (ref 13.2–17.1)
LYMPHS PCT: 41 %
Lymphs Abs: 3731 cells/uL (ref 850–3900)
MCH: 28 pg (ref 27.0–33.0)
MCHC: 32.9 g/dL (ref 32.0–36.0)
MCV: 85.1 fL (ref 80.0–100.0)
MONOS PCT: 7 %
MPV: 11.7 fL (ref 7.5–12.5)
Monocytes Absolute: 637 cells/uL (ref 200–950)
NEUTROS PCT: 50 %
Neutro Abs: 4550 cells/uL (ref 1500–7800)
Platelets: 186 10*3/uL (ref 140–400)
RBC: 4.89 MIL/uL (ref 4.20–5.80)
RDW: 14.9 % (ref 11.0–15.0)
WBC: 9.1 10*3/uL (ref 3.8–10.8)

## 2015-11-16 LAB — BASIC METABOLIC PANEL
BUN: 16 mg/dL (ref 7–25)
CO2: 30 mmol/L (ref 20–31)
Calcium: 9.4 mg/dL (ref 8.6–10.3)
Chloride: 104 mmol/L (ref 98–110)
Creat: 0.8 mg/dL (ref 0.70–1.18)
Glucose, Bld: 129 mg/dL — ABNORMAL HIGH (ref 65–99)
POTASSIUM: 4 mmol/L (ref 3.5–5.3)
SODIUM: 142 mmol/L (ref 135–146)

## 2015-11-16 LAB — PROTIME-INR
INR: 1
PROTHROMBIN TIME: 10.5 s (ref 9.0–11.5)

## 2015-11-16 LAB — APTT: aPTT: 27 s (ref 22–34)

## 2015-11-25 ENCOUNTER — Ambulatory Visit (HOSPITAL_COMMUNITY)
Admission: RE | Admit: 2015-11-25 | Discharge: 2015-11-26 | Disposition: A | Discharge status: Home / Self Care | Payer: Medicare HMO | Source: Ambulatory Visit | Attending: Cardiovascular Disease | Admitting: Cardiovascular Disease

## 2015-11-25 ENCOUNTER — Encounter (HOSPITAL_COMMUNITY): Payer: Self-pay | Admitting: Cardiovascular Disease

## 2015-11-25 ENCOUNTER — Encounter (HOSPITAL_COMMUNITY)
Admission: RE | Disposition: A | Discharge status: Home / Self Care | Payer: Self-pay | Source: Ambulatory Visit | Attending: Cardiovascular Disease

## 2015-11-25 DIAGNOSIS — I70213 Atherosclerosis of native arteries of extremities with intermittent claudication, bilateral legs: Secondary | ICD-10-CM | POA: Diagnosis not present

## 2015-11-25 DIAGNOSIS — Z95820 Peripheral vascular angioplasty status with implants and grafts: Secondary | ICD-10-CM | POA: Insufficient documentation

## 2015-11-25 DIAGNOSIS — Z7984 Long term (current) use of oral hypoglycemic drugs: Secondary | ICD-10-CM | POA: Diagnosis not present

## 2015-11-25 DIAGNOSIS — I10 Essential (primary) hypertension: Secondary | ICD-10-CM | POA: Diagnosis not present

## 2015-11-25 DIAGNOSIS — I70212 Atherosclerosis of native arteries of extremities with intermittent claudication, left leg: Secondary | ICD-10-CM | POA: Diagnosis not present

## 2015-11-25 DIAGNOSIS — I739 Peripheral vascular disease, unspecified: Secondary | ICD-10-CM | POA: Diagnosis present

## 2015-11-25 DIAGNOSIS — Z951 Presence of aortocoronary bypass graft: Secondary | ICD-10-CM | POA: Diagnosis not present

## 2015-11-25 DIAGNOSIS — Z7982 Long term (current) use of aspirin: Secondary | ICD-10-CM | POA: Insufficient documentation

## 2015-11-25 DIAGNOSIS — Z950 Presence of cardiac pacemaker: Secondary | ICD-10-CM | POA: Insufficient documentation

## 2015-11-25 DIAGNOSIS — Z7902 Long term (current) use of antithrombotics/antiplatelets: Secondary | ICD-10-CM | POA: Diagnosis not present

## 2015-11-25 DIAGNOSIS — Z87891 Personal history of nicotine dependence: Secondary | ICD-10-CM | POA: Diagnosis not present

## 2015-11-25 DIAGNOSIS — Z91010 Allergy to peanuts: Secondary | ICD-10-CM | POA: Insufficient documentation

## 2015-11-25 DIAGNOSIS — I2510 Atherosclerotic heart disease of native coronary artery without angina pectoris: Secondary | ICD-10-CM | POA: Diagnosis not present

## 2015-11-25 DIAGNOSIS — G4733 Obstructive sleep apnea (adult) (pediatric): Secondary | ICD-10-CM | POA: Insufficient documentation

## 2015-11-25 DIAGNOSIS — E785 Hyperlipidemia, unspecified: Secondary | ICD-10-CM | POA: Diagnosis not present

## 2015-11-25 HISTORY — PX: PERIPHERAL VASCULAR CATHETERIZATION: SHX172C

## 2015-11-25 LAB — POCT ACTIVATED CLOTTING TIME
Activated Clotting Time: 202 seconds
Activated Clotting Time: 219 seconds
Activated Clotting Time: 158 seconds

## 2015-11-25 LAB — GLUCOSE, CAPILLARY
GLUCOSE-CAPILLARY: 120 mg/dL — AB (ref 65–99)
GLUCOSE-CAPILLARY: 165 mg/dL — AB (ref 65–99)
GLUCOSE-CAPILLARY: 196 mg/dL — AB (ref 65–99)

## 2015-11-25 SURGERY — LOWER EXTREMITY ANGIOGRAPHY

## 2015-11-25 MED ORDER — HYDROCHLOROTHIAZIDE 25 MG PO TABS
25.0000 mg | ORAL_TABLET | Freq: Every day | ORAL | Status: DC
  Administered 2015-11-26: 25 mg via ORAL
  Filled 2015-11-25 (×2): qty 1

## 2015-11-25 MED ORDER — LOSARTAN POTASSIUM 50 MG PO TABS
100.0000 mg | ORAL_TABLET | Freq: Every day | ORAL | Status: DC
  Administered 2015-11-26: 100 mg via ORAL
  Filled 2015-11-25 (×2): qty 2

## 2015-11-25 MED ORDER — ALBUTEROL SULFATE 108 (90 BASE) MCG/ACT IN AEPB: 2.0000 | INHALATION_SPRAY | Freq: Four times a day (QID) | RESPIRATORY_TRACT | Status: DC

## 2015-11-25 MED ORDER — MIDAZOLAM HCL 2 MG/2ML IJ SOLN
INTRAMUSCULAR | Status: AC
  Filled 2015-11-25: qty 2

## 2015-11-25 MED ORDER — METFORMIN HCL 500 MG PO TABS: 1000.0000 mg | ORAL_TABLET | Freq: Two times a day (BID) | ORAL | Status: DC

## 2015-11-25 MED ORDER — ASPIRIN 81 MG PO CHEW
CHEWABLE_TABLET | ORAL | Status: AC
  Filled 2015-11-25: qty 1

## 2015-11-25 MED ORDER — ACETAMINOPHEN 500 MG PO TABS: 500.0000 mg | ORAL_TABLET | Freq: Four times a day (QID) | ORAL | Status: DC | PRN

## 2015-11-25 MED ORDER — METOPROLOL TARTRATE 25 MG PO TABS
25.0000 mg | ORAL_TABLET | Freq: Two times a day (BID) | ORAL | Status: DC
  Administered 2015-11-25 – 2015-11-26 (×2): 25 mg via ORAL
  Filled 2015-11-25 (×3): qty 1

## 2015-11-25 MED ORDER — NAPROXEN 250 MG PO TABS
250.0000 mg | ORAL_TABLET | Freq: Two times a day (BID) | ORAL | Status: DC
  Filled 2015-11-25: qty 1

## 2015-11-25 MED ORDER — ASPIRIN 81 MG PO CHEW
81.0000 mg | CHEWABLE_TABLET | ORAL | Status: AC
  Administered 2015-11-25: 81 mg via ORAL

## 2015-11-25 MED ORDER — VITAMIN D 1000 UNITS PO TABS
1000.0000 [IU] | ORAL_TABLET | Freq: Every day | ORAL | Status: DC
  Administered 2015-11-26: 10:00:00 1000 [IU] via ORAL
  Filled 2015-11-25 (×2): qty 1

## 2015-11-25 MED ORDER — ACETAMINOPHEN 325 MG PO TABS: 650.0000 mg | ORAL_TABLET | ORAL | Status: DC | PRN

## 2015-11-25 MED ORDER — LOSARTAN POTASSIUM-HCTZ 100-25 MG PO TABS: 1.0000 | ORAL_TABLET | Freq: Every day | ORAL | Status: DC

## 2015-11-25 MED ORDER — HEPARIN (PORCINE) IN NACL 2-0.9 UNIT/ML-% IJ SOLN
INTRAMUSCULAR | Status: AC
  Filled 2015-11-25: qty 1000

## 2015-11-25 MED ORDER — HEPARIN SODIUM (PORCINE) 1000 UNIT/ML IJ SOLN
INTRAMUSCULAR | Status: AC
  Filled 2015-11-25: qty 1

## 2015-11-25 MED ORDER — TAMSULOSIN HCL 0.4 MG PO CAPS
0.4000 mg | ORAL_CAPSULE | Freq: Every day | ORAL | Status: DC
  Administered 2015-11-26: 0.4 mg via ORAL
  Filled 2015-11-25 (×2): qty 1

## 2015-11-25 MED ORDER — GABAPENTIN 300 MG PO CAPS
300.0000 mg | ORAL_CAPSULE | Freq: Every day | ORAL | Status: DC
  Administered 2015-11-25: 300 mg via ORAL
  Filled 2015-11-25: qty 1

## 2015-11-25 MED ORDER — SODIUM CHLORIDE 0.9 % IV SOLN
INTRAVENOUS | Status: AC
  Administered 2015-11-25: 10:00:00 via INTRAVENOUS

## 2015-11-25 MED ORDER — ASPIRIN EC 81 MG PO TBEC: 81.0000 mg | DELAYED_RELEASE_TABLET | Freq: Every day | ORAL | Status: DC

## 2015-11-25 MED ORDER — HEPARIN SODIUM (PORCINE) 1000 UNIT/ML IJ SOLN
INTRAMUSCULAR | Status: DC | PRN
  Administered 2015-11-25: 6000 [IU] via INTRAVENOUS
  Administered 2015-11-25: 3000 [IU] via INTRAVENOUS

## 2015-11-25 MED ORDER — CLOPIDOGREL BISULFATE 75 MG PO TABS: 75.0000 mg | ORAL_TABLET | Freq: Every day | ORAL | Status: DC

## 2015-11-25 MED ORDER — NITROGLYCERIN 0.4 MG SL SUBL
0.4000 mg | SUBLINGUAL_TABLET | SUBLINGUAL | Status: DC | PRN
  Filled 2015-11-25: qty 1

## 2015-11-25 MED ORDER — LIDOCAINE HCL (PF) 1 % IJ SOLN
INTRAMUSCULAR | Status: DC | PRN
  Administered 2015-11-25: 30 mL

## 2015-11-25 MED ORDER — ONDANSETRON HCL 4 MG/2ML IJ SOLN
4.0000 mg | Freq: Four times a day (QID) | INTRAMUSCULAR | Status: DC | PRN
  Administered 2015-11-25: 4 mg via INTRAVENOUS
  Filled 2015-11-25: qty 2

## 2015-11-25 MED ORDER — SODIUM CHLORIDE 0.9 % WEIGHT BASED INFUSION
3.0000 mL/kg/h | INTRAVENOUS | Status: DC
  Administered 2015-11-25: 3 mL/kg/h via INTRAVENOUS

## 2015-11-25 MED ORDER — MULTIVITAMINS PO CAPS: 1.0000 | ORAL_CAPSULE | Freq: Every day | ORAL | Status: DC

## 2015-11-25 MED ORDER — ANGIOPLASTY BOOK
Freq: Once | Status: AC
  Administered 2015-11-25: 22:00:00
  Filled 2015-11-25: qty 1

## 2015-11-25 MED ORDER — FENTANYL CITRATE (PF) 100 MCG/2ML IJ SOLN
INTRAMUSCULAR | Status: AC
  Filled 2015-11-25: qty 2

## 2015-11-25 MED ORDER — FENTANYL CITRATE (PF) 100 MCG/2ML IJ SOLN
INTRAMUSCULAR | Status: DC | PRN
  Administered 2015-11-25: 25 ug via INTRAVENOUS

## 2015-11-25 MED ORDER — CLONIDINE HCL 0.1 MG PO TABS
0.1000 mg | ORAL_TABLET | Freq: Two times a day (BID) | ORAL | Status: DC
  Administered 2015-11-25 – 2015-11-26 (×2): 0.1 mg via ORAL
  Filled 2015-11-25 (×2): qty 1

## 2015-11-25 MED ORDER — ADULT MULTIVITAMIN W/MINERALS CH
1.0000 | ORAL_TABLET | Freq: Every day | ORAL | Status: DC
  Administered 2015-11-26: 1 via ORAL
  Filled 2015-11-25 (×2): qty 1

## 2015-11-25 MED ORDER — SODIUM CHLORIDE 0.9 % WEIGHT BASED INFUSION: 1.0000 mL/kg/h | INTRAVENOUS | Status: DC

## 2015-11-25 MED ORDER — MIDAZOLAM HCL 2 MG/2ML IJ SOLN
INTRAMUSCULAR | Status: DC | PRN
  Administered 2015-11-25: 1 mg via INTRAVENOUS

## 2015-11-25 MED ORDER — MAGNESIUM HYDROXIDE 400 MG/5ML PO SUSP: 30.0000 mL | ORAL | Status: DC | PRN

## 2015-11-25 MED ORDER — HEPARIN (PORCINE) IN NACL 2-0.9 UNIT/ML-% IJ SOLN
INTRAMUSCULAR | Status: DC | PRN
  Administered 2015-11-25: 1000 mL

## 2015-11-25 MED ORDER — IODIXANOL 320 MG/ML IV SOLN
INTRAVENOUS | Status: DC | PRN
  Administered 2015-11-25: 190 mL via INTRA_ARTERIAL

## 2015-11-25 MED ORDER — GLIPIZIDE 5 MG PO TABS
5.0000 mg | ORAL_TABLET | Freq: Every day | ORAL | Status: DC
  Administered 2015-11-26: 10:00:00 5 mg via ORAL
  Filled 2015-11-25: qty 1

## 2015-11-25 MED ORDER — HYDRALAZINE HCL 20 MG/ML IJ SOLN
10.0000 mg | INTRAMUSCULAR | Status: DC | PRN
  Administered 2015-11-25: 12:00:00 10 mg via INTRAVENOUS
  Filled 2015-11-25: qty 1

## 2015-11-25 MED ORDER — LIDOCAINE HCL (PF) 1 % IJ SOLN
INTRAMUSCULAR | Status: AC
  Filled 2015-11-25: qty 30

## 2015-11-25 MED ORDER — NAPROXEN SODIUM 220 MG PO TABS: 220.0000 mg | ORAL_TABLET | Freq: Two times a day (BID) | ORAL | Status: DC

## 2015-11-25 MED ORDER — SODIUM CHLORIDE 0.9% FLUSH: 3.0000 mL | INTRAVENOUS | Status: DC | PRN

## 2015-11-25 MED ORDER — VITAMIN C 500 MG PO TABS
500.0000 mg | ORAL_TABLET | Freq: Two times a day (BID) | ORAL | Status: DC
  Administered 2015-11-26: 10:00:00 500 mg via ORAL
  Filled 2015-11-25: qty 1

## 2015-11-25 MED ORDER — ASPIRIN EC 81 MG PO TBEC
81.0000 mg | DELAYED_RELEASE_TABLET | Freq: Every day | ORAL | Status: DC
  Administered 2015-11-26: 10:00:00 81 mg via ORAL
  Filled 2015-11-25: qty 1

## 2015-11-25 MED ORDER — CLOPIDOGREL BISULFATE 75 MG PO TABS
75.0000 mg | ORAL_TABLET | Freq: Every day | ORAL | Status: DC
  Administered 2015-11-26: 08:00:00 75 mg via ORAL
  Filled 2015-11-25 (×2): qty 1

## 2015-11-25 MED ORDER — AMLODIPINE BESYLATE 10 MG PO TABS
10.0000 mg | ORAL_TABLET | Freq: Every day | ORAL | Status: DC
  Administered 2015-11-26: 10 mg via ORAL
  Filled 2015-11-25 (×2): qty 1

## 2015-11-25 MED ORDER — ISOSORBIDE MONONITRATE ER 30 MG PO TB24
30.0000 mg | ORAL_TABLET | Freq: Every day | ORAL | Status: DC
  Administered 2015-11-26: 10:00:00 30 mg via ORAL
  Filled 2015-11-25 (×2): qty 1

## 2015-11-25 MED ORDER — ALBUTEROL SULFATE (2.5 MG/3ML) 0.083% IN NEBU: 2.5000 mg | INHALATION_SOLUTION | Freq: Four times a day (QID) | RESPIRATORY_TRACT | Status: DC | PRN

## 2015-11-25 SURGICAL SUPPLY — 20 items
BALLN MUSTANG 4.0X40 75 (BALLOONS) ×3
BALLN MUSTANG 6.0X40 75 (BALLOONS) ×3
BALLOON MUSTANG 4.0X40 75 (BALLOONS) ×2 IMPLANT
BALLOON MUSTANG 6.0X40 75 (BALLOONS) ×2 IMPLANT
CATH ANGIO 5F BER2 65CM (CATHETERS) ×3 IMPLANT
CATH ANGIO 5F PIGTAIL 65CM (CATHETERS) ×3 IMPLANT
CATH CROSS OVER TEMPO 5F (CATHETERS) ×3 IMPLANT
CATH STRAIGHT 5FR 65CM (CATHETERS) ×3 IMPLANT
KIT ENCORE 26 ADVANTAGE (KITS) ×3 IMPLANT
KIT PV (KITS) ×3 IMPLANT
SHEATH HIGHFLEX ANSEL 6FRX55 (SHEATH) ×3 IMPLANT
SHEATH PINNACLE 5F 10CM (SHEATH) ×3 IMPLANT
SHEATH PINNACLE 6F 10CM (SHEATH) ×3 IMPLANT
SHEATH PINNACLE ST 6F 45CM (SHEATH) ×3 IMPLANT
STENT EPIC VASCULAR 9X40X75 (Permanent Stent) ×3 IMPLANT
SYRINGE MEDRAD AVANTA MACH 7 (SYRINGE) ×3 IMPLANT
TRANSDUCER W/STOPCOCK (MISCELLANEOUS) ×3 IMPLANT
TRAY PV CATH (CUSTOM PROCEDURE TRAY) ×3 IMPLANT
WIRE HITORQ VERSACORE ST 145CM (WIRE) ×3 IMPLANT
WIRE VERSACORE LOC 115CM (WIRE) ×3 IMPLANT

## 2015-11-25 NOTE — Interval H&P Note (Signed)
History and Physical Interval Note:  11/25/2015 8:46 AM  Robert Burns  has presented today for surgery, with the diagnosis of pad  The various methods of treatment have been discussed with the patient and family. After consideration of risks, benefits and other options for treatment, the patient has consented to  Procedure(s): Lower Extremity Angiography (N/A) as a surgical intervention .  The patient's history has been reviewed, patient examined, no change in status, stable for surgery.  I have reviewed the patient's chart and labs.  Questions were answered to the patient's satisfaction.     Quay Burow

## 2015-11-25 NOTE — H&P (View-Only) (Signed)
11/10/2015 Robert Burns   Apr 07, 1941  [id number]  Primary Physician Thurman Coyer, MD Primary Cardiologist: Lorretta Harp MD [name]  HPI:  The patient is a very pleasant 75 year old, married Caucasian male, father of 32, grandfather to 3 grandchildren whose wife [name] who is also a patient of mine. I last saw him in the office 05/26/15... He has a history of CAD status post coronary artery bypass grafting March 2004 with a LIMA to his LAD, a vein to a diagonal branch, a sequential vein to a ramus and OM branch, as well as a vein to the PDA. Last functional study performed July 28, 2011, was entirely normal. 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 He had a pacemaker placed for sick sinus syndrome November 2008 followed by Dr. Sallyanne Kuster. He has obstructive sleep apnea on CPAP. He complain of left thigh pain and I angiogram'd him revealing patent arteries though he did have a space-occupying lesion which was removed by Dr. Kellie Simmering with placement of an interposition 6-mm Gore-Tex graft. The pathology was uncertain. 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 Dr. Baxter Flattery follows his lipid profile.Since I saw him back 11/07/12 he has done well. Followup Dopplers performed in our office 09/27/12 revealed a high-grade lesion in the distal right SFA with an ABI of 0.3. His left ABI 1.1 without obstructive disease. His most recent lower extremity Doppler Dopplers performed 9/32/16 revealed a right ABI 0.82 And a left ABI of 0.89. Over the last 3 months he's noticed anginal chest pain occurring both at rest and with exertion with left upper extremity radiation. He also complains of left lower extremity discomfort. to moderate anterolateral ischemia. Because of ongoing chest pain and a Myoview that showed anterolateral ischemia and he underwent cardiac catheterization on 02/01/15 revealing high grade segmental proximal right SVG disease and subtotally  occluded diagonal branch SVG. He underwent stenting of his RCA SVG successfully. He does have continued chest pain although somewhat improved since his last procedure.  I brought him back for staged diagonal branch SVG intervention on 03/01/15. This was successful and since that time he denies chest pain or shortness of breath. He underwent a generator change by Dr. Sallyanne Kuster in January for end-of-life of his Medtronic pacemaker. Since I saw him 6 months ago he developed new left calf claudication of the last 3 months. Lower extremity arterial Doppler studies performed today revealed a decline in his left ABI from 0.87 down to 0.59 with an occluded left SFA that is a new finding.   Current Outpatient Prescriptions  Medication Sig Dispense Refill  . acetaminophen (TYLENOL) 500 MG tablet Take 500 mg by mouth every 6 (six) hours as needed for moderate pain or headache.     . Albuterol Sulfate (PROAIR RESPICLICK) 123XX123 (90 BASE) MCG/ACT AEPB Inhale 2 puffs into the lungs 4 (four) times daily.    [name] amLODipine (NORVASC) 10 MG tablet Take 10 mg by mouth daily.      [name] aspirin EC 81 MG tablet Take 81 mg by mouth daily.      . B Complex Vitamins (B COMPLEX-B12 PO) Take 1 tablet by mouth daily.     . cloNIDine (CATAPRES) 0.1 MG tablet Take 0.1 mg by mouth 2 (two) times daily.    . clopidogrel (PLAVIX) 75 MG tablet Take 1 tablet (75 mg total) by mouth daily with breakfast. 90 tablet 3  . Doxylamine Succinate, Sleep, (SLEEP AID  PO) Take 1 tablet by mouth as needed (for sleep).     . gabapentin (NEURONTIN) 100 MG capsule Take 300 mg by mouth daily.     [name] glipiZIDE (GLUCOTROL) 5 MG tablet Take 5 mg by mouth daily.      [name] guaiFENesin-codeine (ROBITUSSIN AC) 100-10 MG/5ML syrup Take 10 mLs by mouth daily as needed for cough.     . imiquimod (ALDARA) 5 % cream Apply 1 application topically daily. Apply daily x 5 days then hold x 2 days then repeat.    . isosorbide mononitrate (IMDUR) 30 MG 24 hr tablet Take 1 tablet (30  mg total) by mouth daily. 30 tablet 11  . losartan-hydrochlorothiazide (HYZAAR) 100-25 MG per tablet Take 1 tablet by mouth daily.      [name] lovastatin (MEVACOR) 40 MG tablet Take 40 mg by mouth at bedtime.      . magnesium hydroxide (MILK OF MAGNESIA) 400 MG/5ML suspension Take 30 mLs by mouth as needed for moderate constipation or indigestion.     . metFORMIN (GLUCOPHAGE) 1000 MG tablet Take 1,000 mg by mouth 2 (two) times daily with a meal.      . metoprolol tartrate (LOPRESSOR) 25 MG tablet Take 25 mg by mouth 2 (two) times daily.      . Multiple Vitamin (MULTIVITAMIN) capsule Take 1 capsule by mouth daily.      . nitroGLYCERIN (NITROSTAT) 0.4 MG SL tablet Place 1 tablet (0.4 mg total) under the tongue every 5 (five) minutes as needed for chest pain. 25 tablet 3  . tamsulosin (FLOMAX) 0.4 MG CAPS capsule Take 0.4 mg by mouth daily.    . vitamin C (ASCORBIC ACID) 500 MG tablet Take 500 mg by mouth 2 (two) times daily.       No current facility-administered medications for this visit.     Allergies  Allergen Reactions  . Peanut-Containing Drug Products Anaphylaxis and Other (See Comments)    Tongue swelling is severe  . Ace Inhibitors Other (See Comments)    Cough  . Clonidine Hcl Other (See Comments)    Patch only - skin irritation  . Coreg [Carvedilol] Itching  . Diltiazem Hcl Other (See Comments)    Unknown  . Mobic [Meloxicam] Other (See Comments)    GI upset  . Ramipril Other (See Comments)    Unknown    Social History   Social History  . Marital status: Married    Spouse name: N/A  . Number of children: N/A  . Years of education: N/A   Occupational History  . Not on file.   Social History Main Topics  . Smoking status: Former Smoker    Years: 5.00    Types: Cigarettes, Pipe    Quit date: 04/10/1973  . Smokeless tobacco: Never Used  . Alcohol use No  . Drug use: No  . Sexual activity: No   Other Topics Concern  . Not on file   Social History Narrative  . No  narrative on file     Review of Systems: General: negative for chills, fever, night sweats or weight changes.  Cardiovascular: negative for chest pain, dyspnea on exertion, edema, orthopnea, palpitations, paroxysmal nocturnal dyspnea or shortness of breath Dermatological: negative for rash Respiratory: negative for cough or wheezing Urologic: negative for hematuria Abdominal: negative for nausea, vomiting, diarrhea, bright red blood per rectum, melena, or hematemesis Neurologic: negative for visual changes, syncope, or dizziness All other systems reviewed and are otherwise negative except as noted above.  Blood pressure 134/76, pulse 94, height 5\' 8"  (1.727 m), weight 176 lb (79.8 kg).  General appearance: alert and no distress Neck: no adenopathy, no carotid bruit, no JVD, supple, symmetrical, trachea midline and thyroid not enlarged, symmetric, no tenderness/mass/nodules Lungs: clear to auscultation bilaterally Heart: regular rate and rhythm, S1, S2 normal, no murmur, click, rub or gallop Extremities: extremities normal, atraumatic, no cyanosis or edema  EKG not performed today  ASSESSMENT AND PLAN:   SSS (sick sinus syndrome), medtronic adapta History of sick sinus syndrome status post permanent  transvenous pacemaker implantation (Medtronic Adapta) 10/13/05 followed by Dr. Sallyanne Kuster.  Essential hypertension History of hypertension blood pressure measured 134/76. He is on amlodipine, clonidine, losartan and hydrochlorothiazide. 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 He did have a space-occupying lesion in his left groin relatively had interposition 6 mm Gore-Tex graft placed by Dr. Kellie Simmering. At the time of his last cath 02/01/15 I did demonstrate a high-grade lesion in his left SFA just proximal to graft  insertion over that time he denied claudication. Over the last 3 months she's noticed worsening left calf claudication. Dopplers performed today showed decline in his ABI from 0.87 to .59 with an occluded left SFA which is new. I will arrange for him to undergo angiography and potential intervention in 2 weeks.  Hyperlipidemia History of hyperlipidemia on statin therapy by his PCP  CAD S/P SVG-PDA DES 02/01/15, followed by staged SVG-Dx DES 03/01/15 History of CAD status post coronary artery bypass grafting March 2004 with LIMA to his LAD, vein to the diagonal branch, sequential vein to the ramus and OM branch as well as the PDA. Because of chest pain Myoview stress test was performed showed anterolateral ischemia2 cardiac catheterization 02/01/15 revealing high grade segmental proximal right finger graft stenosis and a subtotally occluded diagonal branch vein graft. He underwent RCA vein graft intervention successfully. I brought him back and states his diagonal vein graft intervention. Since that time has remained pain-free.      Lorretta Harp MD FACP,FACC,FAHA, FSCAI 11/10/2015 2:00 PM

## 2015-11-25 NOTE — Care Management Note (Signed)
Case Management Note  Patient Details  Name: Robert Burns MRN: [id number] Date of Birth: 1940/08/10  Subjective/Objective:  Patient is s/p lower ext angiography, already on plavix and asa.  NCM will cont to follow for dc needs.                 Action/Plan:   Expected Discharge Date:                  Expected Discharge Plan:  Home/Self Care  In-House Referral:     Discharge planning Services  CM Consult  Post Acute Care Choice:    Choice offered to:     DME Arranged:    DME Agency:     HH Arranged:    HH Agency:     Status of Service:  In process, will continue to follow  If discussed at Long Length of Stay Meetings, dates discussed:    Additional Comments:  Zenon Mayo, RN 11/25/2015, 12:10 PM

## 2015-11-26 ENCOUNTER — Ambulatory Visit: Payer: Medicare HMO | Admitting: Cardiovascular Disease

## 2015-11-26 DIAGNOSIS — I739 Peripheral vascular disease, unspecified: Secondary | ICD-10-CM

## 2015-11-26 DIAGNOSIS — I70213 Atherosclerosis of native arteries of extremities with intermittent claudication, bilateral legs: Secondary | ICD-10-CM | POA: Diagnosis not present

## 2015-11-26 LAB — CBC
HEMATOCRIT: 36.2 % — AB (ref 39.0–52.0)
HEMOGLOBIN: 11.5 g/dL — AB (ref 13.0–17.0)
MCH: 27.4 pg (ref 26.0–34.0)
MCHC: 31.8 g/dL (ref 30.0–36.0)
MCV: 86.4 fL (ref 78.0–100.0)
Platelets: 182 10*3/uL (ref 150–400)
RBC: 4.19 MIL/uL — ABNORMAL LOW (ref 4.22–5.81)
RDW: 14.7 % (ref 11.5–15.5)
WBC: 10.6 10*3/uL — ABNORMAL HIGH (ref 4.0–10.5)

## 2015-11-26 LAB — BASIC METABOLIC PANEL
Anion gap: 7 (ref 5–15)
BUN: 11 mg/dL (ref 6–20)
CALCIUM: 8.9 mg/dL (ref 8.9–10.3)
CO2: 27 mmol/L (ref 22–32)
Chloride: 107 mmol/L (ref 101–111)
Creatinine, Ser: 0.8 mg/dL (ref 0.61–1.24)
GFR calc Af Amer: >60 mL/min (ref >60)
GLUCOSE: 117 mg/dL — AB (ref 65–99)
POTASSIUM: 3.9 mmol/L (ref 3.5–5.1)
Sodium: 141 mmol/L (ref 135–145)

## 2015-11-26 LAB — GLUCOSE, CAPILLARY: Glucose-Capillary: 131 mg/dL — ABNORMAL HIGH (ref 65–99)

## 2015-11-26 MED FILL — Iodixanol Inj 320 MG/ML (Iodine Equivalent): INTRAVENOUS | Qty: 50 | Status: AC

## 2015-11-26 NOTE — Discharge Instructions (Addendum)
° °  AVOID NSAIDS AS THEY WILL INCREASE YOUR RISK OF BLEEDING.

## 2015-11-26 NOTE — Discharge Summary (Signed)
Discharge Summary    Patient ID: ASEEL LAWS,  MRN: [id number], DOB/AGE: April 24, 1940 75 y.o.  Admit date: 11/25/2015 Discharge date: 11/26/2015  Primary Care Provider: [id number] Primary Cardiologist: Dr. Gwenlyn Found  Discharge Diagnoses    Active Problems:   PVD (peripheral vascular disease) ([place])   Claudication [facility])   Essential hypertension   Allergies Allergies  Allergen Reactions  . Peanut-Containing Drug Products Anaphylaxis and Other (See Comments)    Tongue swelling is severe  . Ace Inhibitors Other (See Comments)    Cough  . Clonidine Hcl Other (See Comments)    Patch only - skin irritation  . Coreg [Carvedilol] Itching  . Diltiazem Hcl Other (See Comments)    Unknown  . Mobic [Meloxicam] Other (See Comments)    GI upset  . Ramipril Other (See Comments)    Unknown    Diagnostic Studies/Procedures    Angiographic Data:   1: Abdominal aortogram-distal abdominal aorta was free of significant disease 2: Left lower extremity-there was a 90% stenosis in the left external iliac artery at the point of anastomosis of the previous replace interposition Dacron graft to the proximal left SFA. There was slow flow down the SFA which was occluded just beyond the origin reconstituted by profunda femoris collaterals in the adductor canal. The previously placed stent in the mid to distal left SFA was patent. There was three-vessel runoff 3: Right lower extremity-there was 90% calcified segmental mid right SFA stenosis with three-vessel runoff  Final Impression: Successful left external iliac artery PTA and self expanding stent of a 90% left external iliac artery stenosis just proximal to a previously placed Dacron interposition graft. He still has an occluded SFA remaining on the left side which will need to be addressed in a staged fashion. The wire was removed. The sheath was withdrawn to the but across the bifurcation and exchanged over an 035 wire for a short 6 [place]  sheath. The patient left the lab in stable condition. He was already on dual antiplatelet therapy. He'll be hydrated overnight, discharged home in the morning. I will see him back in the [facility]  office in one to 2 weeks for follow-up at which time we will schedule the stage left SFA intervention. ____________   History of Present Illness     Mr. Rinne is a 75 yo male patient of Dr. Gwenlyn Found with PMH of CAD status post coronary artery bypass grafting March 2004 with a LIMA to his LAD, a vein to a diagonal branch, a sequential vein to a ramus and OM branch, as well as a vein to the PDA. Last functional study performed July 28, 2011, was entirely normal. 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 He had a pacemaker placed for sick sinus syndrome November 2008 followed by Dr. Sallyanne Kuster. He has obstructive sleep apnea on CPAP. He complained of left thigh pain and was angiogramed revealing patent arteries though he did have a space-occupying lesion which was removed by Dr. Kellie Simmering with placement of an interposition 6-mm Gore-Tex graft. The pathology was uncertain. He did continue to have neuropathic pain. Followup Dopplers performed in our office 09/27/12 revealed a high-grade lesion in the distal right SFA with an ABI of 0.3. His left ABI 1.1 without obstructive disease.Because of ongoing chest pain and a Myoview that showed anterolateral ischemia and he underwent cardiac catheterization on 02/01/15 revealing high grade segmental proximal right SVG disease and subtotally occluded diagonal branch  SVG. He underwent stenting of his RCA SVG successfully. He underwent a generator change by Dr. Sallyanne Kuster in January for end-of-life of his Medtronic pacemaker. Since  He was seen 6 months ago by Dr. Gwenlyn Found he developed new left calf claudication in the last 3 months. Lower extremity arterial Doppler studies performed 11/10/2015 revealed a decline in his left ABI from 0.87 down to 0.59 with an  occluded left SFA that is a new finding. He was brought in for outpatient lower extremity angiography.   Hospital Course     Consultants: None  He underwent lower extremity angiography with [facility] on 11/25/2015 with successful left external iliac artery PTA and stenting of a 90% left external iliac artery stenosis just proximal to a previously placed Dacron interposition graft. Still has an occluded SFA on the left side that will be addressed in a staged fashion. He was keep and hydrated overnight, with plans to discharge the following morning. He will remain on dual antiplatelet therapy.   His labs the following morning showed a stable Cr and Hgb. He actually reports his left leg feels slightly better post intervention.   Physical Exam:   General appearance: alert and no distress Neck: no adenopathy, no carotid bruit, no JVD, supple, symmetrical, trachea midline and thyroid not enlarged, symmetric, no tenderness/mass/nodules Lungs: clear to auscultation bilaterally Heart: regular rate and rhythm, S1, S2 normal, no murmur, click, rub or gallop Extremities: extremities normal, atraumatic, no cyanosis or edema, Right groin cath site is stable with hematoma or bruit.   He was seen and assessed by Dr. Harrington Challenger and determined stable to discharge home. I have arranged follow up in the office as an add on to Dr. Kennon Holter schedule.  _____________  Discharge Vitals Blood pressure (!) 166/58, pulse 61, temperature 97.4 F (36.3 C), temperature source Oral, resp. rate 16, height 5\' 8"  (1.727 m), weight 175 lb 7.8 oz (79.6 kg), SpO2 98 %.  Filed Weights   11/25/15 0714 11/26/15 0407  Weight: 174 lb (78.9 kg) 175 lb 7.8 oz (79.6 kg)    Labs & Radiologic Studies    CBC  Recent Labs  11/26/15 0403  WBC 10.6*  HGB 11.5*  HCT 36.2*  MCV 86.4  PLT [id number]   Basic Metabolic Panel  Recent Labs  11/26/15 0403  NA 141  K 3.9  CL 107  CO2 27  GLUCOSE 117*  BUN 11  CREATININE 0.80  CALCIUM 8.9    Liver Function Tests No results for input(s): AST, ALT, ALKPHOS, BILITOT, PROT, ALBUMIN in the last 72 hours. No results for input(s): LIPASE, AMYLASE in the last 72 hours. Cardiac Enzymes No results for input(s): CKTOTAL, CKMB, CKMBINDEX, TROPONINI in the last 72 hours. BNP Invalid input(s): POCBNP D-Dimer No results for input(s): DDIMER in the last 72 hours. Hemoglobin A1C No results for input(s): HGBA1C in the last 72 hours. Fasting Lipid Panel No results for input(s): CHOL, HDL, LDLCALC, TRIG, CHOLHDL, LDLDIRECT in the last 72 hours. Thyroid Function Tests No results for input(s): TSH, T4TOTAL, T3FREE, THYROIDAB in the last 72 hours.  Invalid input(s): FREET3 _____________  No results found. Disposition   Pt is being discharged home today in good condition.  Follow-up Plans & Appointments    Follow-up Information    Quay Burow, MD Follow up on 12/08/2015.   Specialties:  Cardiology, Radiology Why:  11:30am for your hospital follow up with Dr. Gwenlyn Found.  Contact information: [address] [place] [address] [phone]  Discharge Instructions    Call MD for:  redness, tenderness, or signs of infection (pain, swelling, redness, odor or green/yellow discharge around incision site)    Complete by:  As directed   Diet - low sodium heart healthy    Complete by:  As directed   Discharge instructions    Complete by:  As directed   Groin Site Care Refer to this sheet in the next few weeks. These instructions provide you with information on caring for yourself after your procedure. Your caregiver may also give you more specific instructions. Your treatment has been planned according to current medical practices, but problems sometimes occur. Call your caregiver if you have any problems or questions after your procedure. HOME CARE INSTRUCTIONS You may shower 24 hours after the procedure. Remove the bandage (dressing) and gently wash the site with  plain soap and water. Gently pat the site dry.  Do not apply powder or lotion to the site.  Do not sit in a bathtub, swimming pool, or whirlpool for 5 to 7 days.  No bending, squatting, or lifting anything over 10 pounds (4.5 kg) as directed by your caregiver.  Inspect the site at least twice daily.  Do not drive home if you are discharged the same day of the procedure. Have someone else drive you.  You may drive 24 hours after the procedure unless otherwise instructed by your caregiver.  What to expect: Any bruising will usually fade within 1 to 2 weeks.  Blood that collects in the tissue (hematoma) may be painful to the touch. It should usually decrease in size and tenderness within 1 to 2 weeks.  SEEK IMMEDIATE MEDICAL CARE IF: You have unusual pain at the groin site or down the affected leg.  You have redness, warmth, swelling, or pain at the groin site.  You have drainage (other than a small amount of blood on the dressing).  You have chills.  You have a fever or persistent symptoms for more than 72 hours.  You have a fever and your symptoms suddenly get worse.  Your leg becomes pale, cool, tingly, or numb.  You have heavy bleeding from the site. Hold pressure on the site. .   Increase activity slowly    Complete by:  As directed      Discharge Medications   Current Discharge Medication List    CONTINUE these medications which have NOT CHANGED   Details  acetaminophen (TYLENOL) 500 MG tablet Take 500 mg by mouth every 6 (six) hours as needed for moderate pain or headache.     Albuterol Sulfate (PROAIR RESPICLICK) 123XX123 (90 BASE) MCG/ACT AEPB Inhale 2 puffs into the lungs 4 (four) times daily.    amLODipine (NORVASC) 10 MG tablet Take 10 mg by mouth daily.      aspirin EC 81 MG tablet Take 81 mg by mouth daily.      B Complex Vitamins (B COMPLEX-B12 PO) Take 1 tablet by mouth daily.     cholecalciferol (VITAMIN D) 1000 units tablet Take 1,000 Units by mouth daily.      cloNIDine (CATAPRES) 0.1 MG tablet Take 0.1 mg by mouth 2 (two) times daily.    clopidogrel (PLAVIX) 75 MG tablet Take 1 tablet (75 mg total) by mouth daily with breakfast. Qty: 90 tablet, Refills: 3    Doxylamine Succinate, Sleep, (SLEEP AID PO) Take 1 tablet by mouth as needed (for sleep).     gabapentin (NEURONTIN) 100 MG capsule Take 300 mg by mouth  at bedtime.     glipiZIDE (GLUCOTROL) 5 MG tablet Take 5 mg by mouth daily.      isosorbide mononitrate (IMDUR) 30 MG 24 hr tablet Take 1 tablet (30 mg total) by mouth daily. Qty: 30 tablet, Refills: 11    losartan-hydrochlorothiazide (HYZAAR) 100-25 MG per tablet Take 1 tablet by mouth daily.      lovastatin (MEVACOR) 40 MG tablet Take 40 mg by mouth at bedtime.      magnesium hydroxide (MILK OF MAGNESIA) 400 MG/5ML suspension Take 30 mLs by mouth as needed for moderate constipation or indigestion.     metFORMIN (GLUCOPHAGE) 1000 MG tablet Take 1,000 mg by mouth 2 (two) times daily with a meal.      metoprolol tartrate (LOPRESSOR) 25 MG tablet Take 25 mg by mouth 2 (two) times daily.      Multiple Vitamin (MULTIVITAMIN) capsule Take 1 capsule by mouth daily.      nitroGLYCERIN (NITROSTAT) 0.4 MG SL tablet Place 1 tablet (0.4 mg total) under the tongue every 5 (five) minutes as needed for chest pain. Qty: 25 tablet, Refills: 3    Omega-3 Fatty Acids (FISH OIL PO) Take 1 tablet by mouth daily.    tamsulosin (FLOMAX) 0.4 MG CAPS capsule Take 0.4 mg by mouth daily.    vitamin C (ASCORBIC ACID) 500 MG tablet Take 500 mg by mouth 2 (two) times daily.        STOP taking these medications     naproxen sodium (ANAPROX) 220 MG tablet            Outstanding Labs/Studies   None  Duration of Discharge Encounter   Greater than 30 minutes including physician time.  Signed, Reino Bellis NP-C 11/26/2015, 12:19 PM   Pt seen and examined  I agree with findingas as noted above by [place] for d/c today with f/u in  clinic   Reino Bellis

## 2015-12-06 ENCOUNTER — Ambulatory Visit (INDEPENDENT_AMBULATORY_CARE_PROVIDER_SITE_OTHER): Payer: Medicare HMO | Admitting: *Deleted

## 2015-12-06 DIAGNOSIS — I495 Sick sinus syndrome: Secondary | ICD-10-CM

## 2015-12-07 NOTE — Progress Notes (Signed)
Remote pacemaker transmission.   

## 2015-12-08 ENCOUNTER — Encounter: Payer: Self-pay | Admitting: Cardiovascular Disease

## 2015-12-08 ENCOUNTER — Ambulatory Visit (INDEPENDENT_AMBULATORY_CARE_PROVIDER_SITE_OTHER): Payer: Medicare HMO | Admitting: Cardiovascular Disease

## 2015-12-08 ENCOUNTER — Telehealth: Payer: Self-pay | Admitting: *Deleted

## 2015-12-08 ENCOUNTER — Other Ambulatory Visit: Payer: Self-pay | Admitting: *Deleted

## 2015-12-08 VITALS — BP 132/80 | HR 68 | Ht 67.0 in | Wt 177.6 lb

## 2015-12-08 DIAGNOSIS — D689 Coagulation defect, unspecified: Secondary | ICD-10-CM | POA: Diagnosis not present

## 2015-12-08 DIAGNOSIS — I739 Peripheral vascular disease, unspecified: Secondary | ICD-10-CM

## 2015-12-08 DIAGNOSIS — Z01818 Encounter for other preprocedural examination: Secondary | ICD-10-CM

## 2015-12-08 DIAGNOSIS — Z79899 Other long term (current) drug therapy: Secondary | ICD-10-CM | POA: Diagnosis not present

## 2015-12-08 DIAGNOSIS — I70212 Atherosclerosis of native arteries of extremities with intermittent claudication, left leg: Secondary | ICD-10-CM

## 2015-12-08 LAB — CUP PACEART REMOTE DEVICE CHECK
Battery Impedance: 113 Ohm
Battery Remaining Longevity: 150 mo
Battery Voltage: 2.8 V
Brady Statistic AP VP Percent: 0 %
Brady Statistic AS VS Percent: 1 %
Implantable Lead Implant Date: 20070706
Implantable Lead Implant Date: 20070706
Implantable Lead Location: 753859
Implantable Lead Model: 5076
Lead Channel Impedance Value: 488 Ohm
Lead Channel Pacing Threshold Amplitude: 0.75 V
Lead Channel Sensing Intrinsic Amplitude: 16 mV
Lead Channel Setting Pacing Amplitude: 3.5 V
Lead Channel Setting Pacing Pulse Width: 0.4 ms
MDC IDC LEAD LOCATION: 753860
MDC IDC MSMT LEADCHNL RA PACING THRESHOLD PULSEWIDTH: 0.4 ms
MDC IDC MSMT LEADCHNL RV IMPEDANCE VALUE: 683 Ohm
MDC IDC MSMT LEADCHNL RV PACING THRESHOLD AMPLITUDE: 1.75 V
MDC IDC MSMT LEADCHNL RV PACING THRESHOLD PULSEWIDTH: 0.4 ms
MDC IDC SESS DTM: 20170828193418
MDC IDC SET LEADCHNL RA PACING AMPLITUDE: 1.625
MDC IDC SET LEADCHNL RV SENSING SENSITIVITY: 5.6 mV
MDC IDC STAT BRADY AP VS PERCENT: 99 %
MDC IDC STAT BRADY AS VP PERCENT: 0 %

## 2015-12-08 NOTE — Progress Notes (Signed)
12/08/2015 Robert Burns   May 14, 1940  [id number]  Primary Physician Thurman Coyer, MD Primary Cardiologist: Lorretta Harp MD FACP, [facility], [place], [name]  HPI:  .The patient is a very pleasant 75 year old, married Caucasian male, father of 2, grandfather to 3 grandchildren whose wife [name] who is also a patient of mine. I last saw him in the office 05/26/15... He has a history of CAD status post coronary artery bypass grafting March 2004 with a LIMA to his LAD, a vein to a diagonal branch, a sequential vein to a ramus and OM branch, as well as a vein to the PDA. Last functional study performed July 28, 2011, was entirely normal. 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 He had a pacemaker placed for sick sinus syndrome November 2008 followed by Dr. Sallyanne Kuster. He has obstructive sleep apnea on CPAP. He complain of left thigh pain and I angiogram'd him revealing patent arteries though he did have a space-occupying lesion which was removed by Dr. Kellie Simmering with placement of an interposition 6-mm Gore-Tex graft. The pathology was uncertain. 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 Dr. Baxter Flattery follows his lipid profile.Since I saw him back 11/07/12 he has done well. Followup Dopplers performed in our office 09/27/12 revealed a high-grade lesion in the distal right SFA with an ABI of 0.3. His left ABI 1.1 without obstructive disease. His most recent lower extremity Doppler Dopplers performed 9/32/16 revealed a right ABI 0.82 And a left ABI of 0.89. Over the last 3 months he's noticed anginal chest pain occurring both at rest and with exertion with left upper extremity radiation. He also complains of left lower extremity discomfort. to moderate anterolateral ischemia. Because of ongoing chest pain and a Myoview that showed anterolateral ischemia and he underwent cardiac catheterization on 02/01/15 revealing high grade segmental proximal right SVG disease and subtotally  occluded diagonal branch SVG. He underwent stenting of his RCA SVG successfully. He does have continued chest pain although somewhat improved since his last procedure. I brought him back for staged diagonal branch SVG intervention on 03/01/15. This was successful and since that time he denies chest pain or shortness of breath. He underwent a generator change by Dr. Sallyanne Kuster in January for end-of-life of his Medtronic pacemaker.Since I saw him 6 months ago he developed new left calf claudication of the last 3 months. Lower extremity arterial Doppler studies performed today revealed a decline in his left ABI from 0.87 down to 0.59 with an occluded left SFA that is a new finding. 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 He had a short segment occlusion of the proximal left SFA with patent mid left SFA stent. He had 90% segmental calcified mid right SFA stenosis with three-vessel runoff bilaterally. I ended up stenting his left external iliac artery with a 9 mm x 40 mm long Nitrostat extending stent which improved his symptoms somewhat although he continues to have lifestyle limiting claudication.   Current Outpatient Prescriptions  Medication Sig Dispense Refill  . acetaminophen (TYLENOL) 500 MG tablet Take 500 mg by mouth every 6 (six) hours as needed for moderate pain or headache.     . Albuterol Sulfate (PROAIR RESPICLICK) 123XX123 (90 BASE) MCG/ACT AEPB Inhale 2 puffs into the lungs 4 (four) times daily.    [name] amLODipine (NORVASC) 10 MG tablet Take 10 mg  by mouth daily.      [name] aspirin EC 81 MG tablet Take 81 mg by mouth daily.      . B Complex Vitamins (B COMPLEX-B12 PO) Take 1 tablet by mouth daily.     . cholecalciferol (VITAMIN D) 1000 units tablet Take 1,000 Units by mouth daily.    . cloNIDine (CATAPRES) 0.1 MG tablet Take 0.1 mg by mouth 2 (two) times  daily.    . clopidogrel (PLAVIX) 75 MG tablet Take 1 tablet (75 mg total) by mouth daily with breakfast. 90 tablet 3  . Doxylamine Succinate, Sleep, (SLEEP AID PO) Take 1 tablet by mouth as needed (for sleep).     . gabapentin (NEURONTIN) 100 MG capsule Take 300 mg by mouth at bedtime.     [name] glipiZIDE (GLUCOTROL) 5 MG tablet Take 5 mg by mouth daily.      . isosorbide mononitrate (IMDUR) 30 MG 24 hr tablet Take 1 tablet (30 mg total) by mouth daily. 30 tablet 11  . losartan-hydrochlorothiazide (HYZAAR) 100-25 MG per tablet Take 1 tablet by mouth daily.      [name] lovastatin (MEVACOR) 40 MG tablet Take 40 mg by mouth at bedtime.      . magnesium hydroxide (MILK OF MAGNESIA) 400 MG/5ML suspension Take 30 mLs by mouth as needed for moderate constipation or indigestion.     . metFORMIN (GLUCOPHAGE) 1000 MG tablet Take 1,000 mg by mouth 2 (two) times daily with a meal.      . metoprolol tartrate (LOPRESSOR) 25 MG tablet Take 25 mg by mouth 2 (two) times daily.      . Multiple Vitamin (MULTIVITAMIN) capsule Take 1 capsule by mouth daily.      . nitroGLYCERIN (NITROSTAT) 0.4 MG SL tablet Place 1 tablet (0.4 mg total) under the tongue every 5 (five) minutes as needed for chest pain. 25 tablet 3  . Omega-3 Fatty Acids (FISH OIL PO) Take 1 tablet by mouth daily.    . tamsulosin (FLOMAX) 0.4 MG CAPS capsule Take 0.4 mg by mouth daily.    . vitamin C (ASCORBIC ACID) 500 MG tablet Take 500 mg by mouth 2 (two) times daily.       No current facility-administered medications for this visit.     Allergies  Allergen Reactions  . Peanut-Containing Drug Products Anaphylaxis and Other (See Comments)    Tongue swelling is severe  . Ace Inhibitors Other (See Comments)    Cough  . Clonidine Hcl Other (See Comments)    Patch only - skin irritation  . Coreg [Carvedilol] Itching  . Diltiazem Hcl Other (See Comments)    Unknown  . Mobic [Meloxicam] Other (See Comments)    GI upset  . Ramipril Other (See Comments)      Unknown    Social History   Social History  . Marital status: Married    Spouse name: N/A  . Number of children: N/A  . Years of education: N/A   Occupational History  . Not on file.   Social History Main Topics  . Smoking status: Former Smoker    Years: 10.00    Types: Pipe  . Smokeless tobacco: Never Used     Comment: "quit smoking in 1973"  . Alcohol use No  . Drug use: No  . Sexual activity: Not Currently   Other Topics Concern  . Not on file   Social History Narrative  . No narrative on file     Review of Systems: General:  negative for chills, fever, night sweats or weight changes.  Cardiovascular: negative for chest pain, dyspnea on exertion, edema, orthopnea, palpitations, paroxysmal nocturnal dyspnea or shortness of breath Dermatological: negative for rash Respiratory: negative for cough or wheezing Urologic: negative for hematuria Abdominal: negative for nausea, vomiting, diarrhea, bright red blood per rectum, melena, or hematemesis Neurologic: negative for visual changes, syncope, or dizziness All other systems reviewed and are otherwise negative except as noted above.    Blood pressure 132/80, pulse 68, height 5\' 7"  (1.702 m), weight 177 lb 9.6 oz (80.6 kg).  General appearance: alert and no distress Neck: no adenopathy, no carotid bruit, no JVD, supple, symmetrical, trachea midline and thyroid not enlarged, symmetric, no tenderness/mass/nodules Lungs: clear to auscultation bilaterally Heart: regular rate and rhythm, S1, S2 normal, no murmur, click, rub or gallop Extremities: extremities normal, atraumatic, no cyanosis or edema and Right femoral arterial puncture site is well-healed. He has a 2+ left common femoral pulse without bruit.  EKG normal sinus rhythm at 68 with septal Q waves. 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 Boggs returns today after  his recent lower extremity angiogram and intervention which I performed 11/25/15. He had a prior interposition Dacron graft placed by Dr. Kellie Simmering remotely as well as a mid left SFA stent which I placed. He had progressive claudication with Dopplers that showed a decline in ABI and increase in frequency in his left external iliac artery. Angiogram him 11/25/15 and placed a 9 mm x 40 mm long nitinol self expanding stent in his left external iliac artery reducing 90% stenosis to 0% residual. He does have a short segment occlusion of his proximal left SFA just above a patent mid left SFA stent with three-vessel runoff. In addition, he has a 90% segmental mid right SFA calcified stenosis. He continues to complain of claudication. I did use 190 mL of contrast during the case and therefore did not attempt the left SFA CTO. The patient wishes to have this addressed in the near future.      Lorretta Harp MD FACP,FACC,FAHA, [facility] 12/08/2015 8:08 AM

## 2015-12-08 NOTE — Patient Instructions (Signed)
Medication Instructions:  Your physician recommends that you continue on your current medications as directed. Please refer to the Current Medication list given to you today.  PRIOR TO PROCEDURE: 1- DO NOT TAKE YOUR Metformin the day before or morning of the procedure. 2- DO NOT TAKE YOUR Glipizide the morning of the procedure.   Testing/Procedures: Dr. Gwenlyn Found has ordered a peripheral angiogram to be done at [facility].  This procedure is going to look at the bloodflow in your lower extremities.  If Dr. Gwenlyn Found is able to open up the arteries, you will have to spend one night in the hospital.  If he is not able to open the arteries, you will be able to go home that same day.  SCHEDULE FOR   After the procedure, you will not be allowed to drive for 3 days or push, pull, or lift anything greater than 10 lbs for one week.    You will be required to have the following tests prior to the procedure:  1. Blood work-the blood work can be done no more than 14 days prior to the procedure.  It can be done at any [facility] lab.  There is one downstairs on the first floor of this building and one in the [facility] building [phone] [address])  2. Chest Xray-the chest xray order has already been placed at the [facility].     *REPS   SCOTT  Puncture site RIGHT GROIN  Any Other Special Instructions Will Be Listed Below (If Applicable).     If you need a refill on your cardiac medications before your next appointment, please call your pharmacy.

## 2015-12-08 NOTE — Assessment & Plan Note (Signed)
Robert Burns returns today after his recent lower extremity angiogram and intervention which I performed 11/25/15. He had a prior interposition Dacron graft placed by Dr. Kellie Simmering remotely as well as a mid left SFA stent which I placed. He had progressive claudication with Dopplers that showed a decline in ABI and increase in frequency in his left external iliac artery. Angiogram him 11/25/15 and placed a 9 mm x 40 mm long nitinol self expanding stent in his left external iliac artery reducing 90% stenosis to 0% residual. He does have a short segment occlusion of his proximal left SFA just above a patent mid left SFA stent with three-vessel runoff. In addition, he has a 90% segmental mid right SFA calcified stenosis. He continues to complain of claudication. I did use 190 mL of contrast during the case and therefore did not attempt the left SFA CTO. The patient wishes to have this addressed in the near future.

## 2015-12-08 NOTE — Telephone Encounter (Signed)
Left for Scott regarding procedure date and time--requested call back for confirmation.

## 2015-12-09 ENCOUNTER — Encounter: Payer: Self-pay | Admitting: Cardiology

## 2015-12-20 ENCOUNTER — Ambulatory Visit
Admission: RE | Admit: 2015-12-20 | Discharge: 2015-12-20 | Disposition: A | Discharge status: Home / Self Care | Payer: Medicare HMO | Source: Ambulatory Visit | Attending: Cardiovascular Disease | Admitting: Cardiovascular Disease

## 2015-12-20 DIAGNOSIS — Z79899 Other long term (current) drug therapy: Secondary | ICD-10-CM

## 2015-12-20 DIAGNOSIS — D689 Coagulation defect, unspecified: Secondary | ICD-10-CM

## 2015-12-20 DIAGNOSIS — Z01818 Encounter for other preprocedural examination: Secondary | ICD-10-CM

## 2015-12-20 DIAGNOSIS — I739 Peripheral vascular disease, unspecified: Secondary | ICD-10-CM

## 2015-12-20 LAB — CBC WITH DIFFERENTIAL/PLATELET
BASOS PCT: 0 %
Basophils Absolute: 0 cells/uL (ref 0–200)
EOS ABS: 214 {cells}/uL (ref 15–500)
Eosinophils Relative: 2 %
HEMATOCRIT: 37.8 % — AB (ref 38.5–50.0)
HEMOGLOBIN: 12.5 g/dL — AB (ref 13.2–17.1)
LYMPHS ABS: 3852 {cells}/uL (ref 850–3900)
Lymphocytes Relative: 36 %
MCH: 28.4 pg (ref 27.0–33.0)
MCHC: 33.1 g/dL (ref 32.0–36.0)
MCV: 85.9 fL (ref 80.0–100.0)
MONO ABS: 856 {cells}/uL (ref 200–950)
MPV: 11.6 fL (ref 7.5–12.5)
Monocytes Relative: 8 %
NEUTROS ABS: 5778 {cells}/uL (ref 1500–7800)
Neutrophils Relative %: 54 %
Platelets: 214 10*3/uL (ref 140–400)
RBC: 4.4 MIL/uL (ref 4.20–5.80)
RDW: 15.1 % — AB (ref 11.0–15.0)
WBC: 10.7 10*3/uL (ref 3.8–10.8)

## 2015-12-21 LAB — BASIC METABOLIC PANEL
BUN: 21 mg/dL (ref 7–25)
CHLORIDE: 102 mmol/L (ref 98–110)
CO2: 28 mmol/L (ref 20–31)
Calcium: 9.8 mg/dL (ref 8.6–10.3)
Creat: 0.86 mg/dL (ref 0.70–1.18)
Glucose, Bld: 93 mg/dL (ref 65–99)
POTASSIUM: 4.5 mmol/L (ref 3.5–5.3)
SODIUM: 140 mmol/L (ref 135–146)

## 2015-12-21 LAB — TSH: TSH: 1.1 mIU/L (ref 0.40–4.50)

## 2015-12-21 LAB — APTT: APTT: 28 s (ref 22–34)

## 2015-12-21 LAB — PROTIME-INR
INR: 1
PROTHROMBIN TIME: 10.5 s (ref 9.0–11.5)

## 2015-12-22 ENCOUNTER — Telehealth: Payer: Self-pay | Admitting: *Deleted

## 2015-12-22 NOTE — Telephone Encounter (Signed)
Left detail message on cell phone. In regards to chest xray. Nay question may call back.

## 2015-12-22 NOTE — Telephone Encounter (Signed)
-----   Message from Lorretta Harp, MD sent at 12/20/2015  3:23 PM EDT ----- NAD

## 2016-01-03 ENCOUNTER — Ambulatory Visit (HOSPITAL_COMMUNITY)
Admission: RE | Admit: 2016-01-03 | Discharge: 2016-01-04 | Disposition: A | Discharge status: Home / Self Care | Payer: Medicare HMO | Source: Ambulatory Visit | Attending: Cardiovascular Disease | Admitting: Cardiovascular Disease

## 2016-01-03 ENCOUNTER — Encounter (HOSPITAL_COMMUNITY): Payer: Self-pay | Admitting: *Deleted

## 2016-01-03 ENCOUNTER — Encounter (HOSPITAL_COMMUNITY)
Admission: RE | Disposition: A | Discharge status: Home / Self Care | Payer: Self-pay | Source: Ambulatory Visit | Attending: Cardiovascular Disease

## 2016-01-03 DIAGNOSIS — I70212 Atherosclerosis of native arteries of extremities with intermittent claudication, left leg: Secondary | ICD-10-CM | POA: Diagnosis not present

## 2016-01-03 DIAGNOSIS — Z95820 Peripheral vascular angioplasty status with implants and grafts: Secondary | ICD-10-CM | POA: Insufficient documentation

## 2016-01-03 DIAGNOSIS — I2510 Atherosclerotic heart disease of native coronary artery without angina pectoris: Secondary | ICD-10-CM | POA: Insufficient documentation

## 2016-01-03 DIAGNOSIS — I739 Peripheral vascular disease, unspecified: Secondary | ICD-10-CM | POA: Diagnosis present

## 2016-01-03 DIAGNOSIS — Z951 Presence of aortocoronary bypass graft: Secondary | ICD-10-CM | POA: Insufficient documentation

## 2016-01-03 DIAGNOSIS — I10 Essential (primary) hypertension: Secondary | ICD-10-CM | POA: Diagnosis not present

## 2016-01-03 DIAGNOSIS — I7092 Chronic total occlusion of artery of the extremities: Secondary | ICD-10-CM | POA: Diagnosis not present

## 2016-01-03 DIAGNOSIS — Z955 Presence of coronary angioplasty implant and graft: Secondary | ICD-10-CM | POA: Diagnosis not present

## 2016-01-03 DIAGNOSIS — I495 Sick sinus syndrome: Secondary | ICD-10-CM | POA: Diagnosis not present

## 2016-01-03 DIAGNOSIS — Z7984 Long term (current) use of oral hypoglycemic drugs: Secondary | ICD-10-CM | POA: Diagnosis not present

## 2016-01-03 DIAGNOSIS — Z950 Presence of cardiac pacemaker: Secondary | ICD-10-CM | POA: Insufficient documentation

## 2016-01-03 DIAGNOSIS — G4733 Obstructive sleep apnea (adult) (pediatric): Secondary | ICD-10-CM | POA: Insufficient documentation

## 2016-01-03 DIAGNOSIS — Z7902 Long term (current) use of antithrombotics/antiplatelets: Secondary | ICD-10-CM | POA: Diagnosis not present

## 2016-01-03 DIAGNOSIS — Z7982 Long term (current) use of aspirin: Secondary | ICD-10-CM | POA: Diagnosis not present

## 2016-01-03 DIAGNOSIS — Z79899 Other long term (current) drug therapy: Secondary | ICD-10-CM | POA: Insufficient documentation

## 2016-01-03 HISTORY — PX: POPLITEAL ARTERY STENT: SHX2243

## 2016-01-03 HISTORY — PX: PERIPHERAL VASCULAR CATHETERIZATION: SHX172C

## 2016-01-03 LAB — BASIC METABOLIC PANEL
Anion gap: 9 (ref 5–15)
BUN: 16 mg/dL (ref 6–20)
CHLORIDE: 105 mmol/L (ref 101–111)
CO2: 25 mmol/L (ref 22–32)
Calcium: 9.6 mg/dL (ref 8.9–10.3)
Creatinine, Ser: 0.95 mg/dL (ref 0.61–1.24)
GFR calc Af Amer: >60 mL/min (ref >60)
GFR calc non Af Amer: >60 mL/min (ref >60)
GLUCOSE: 152 mg/dL — AB (ref 65–99)
POTASSIUM: 3.9 mmol/L (ref 3.5–5.1)
Sodium: 139 mmol/L (ref 135–145)

## 2016-01-03 LAB — GLUCOSE, CAPILLARY
GLUCOSE-CAPILLARY: 111 mg/dL — AB (ref 65–99)
Glucose-Capillary: 138 mg/dL — ABNORMAL HIGH (ref 65–99)
Glucose-Capillary: 104 mg/dL — ABNORMAL HIGH (ref 65–99)

## 2016-01-03 LAB — CBC
HCT: 37.9 % — ABNORMAL LOW (ref 39.0–52.0)
HEMOGLOBIN: 12.3 g/dL — AB (ref 13.0–17.0)
MCH: 28.2 pg (ref 26.0–34.0)
MCHC: 32.5 g/dL (ref 30.0–36.0)
MCV: 86.9 fL (ref 78.0–100.0)
Platelets: 179 10*3/uL (ref 150–400)
RBC: 4.36 MIL/uL (ref 4.22–5.81)
RDW: 14.7 % (ref 11.5–15.5)
WBC: 9.1 10*3/uL (ref 4.0–10.5)

## 2016-01-03 LAB — POCT ACTIVATED CLOTTING TIME
ACTIVATED CLOTTING TIME: 164 s
Activated Clotting Time: 208 seconds

## 2016-01-03 SURGERY — LOWER EXTREMITY ANGIOGRAPHY
Anesthesia: LOCAL

## 2016-01-03 MED ORDER — LOSARTAN POTASSIUM-HCTZ 100-25 MG PO TABS: 1.0000 | ORAL_TABLET | Freq: Every day | ORAL | Status: DC

## 2016-01-03 MED ORDER — HEPARIN SODIUM (PORCINE) 1000 UNIT/ML IJ SOLN
INTRAMUSCULAR | Status: AC
  Filled 2016-01-03: qty 1

## 2016-01-03 MED ORDER — HEPARIN (PORCINE) IN NACL 2-0.9 UNIT/ML-% IJ SOLN
INTRAMUSCULAR | Status: AC
  Filled 2016-01-03: qty 1000

## 2016-01-03 MED ORDER — CLONIDINE HCL 0.1 MG PO TABS
0.1000 mg | ORAL_TABLET | Freq: Two times a day (BID) | ORAL | Status: DC
  Administered 2016-01-03: 0.1 mg via ORAL
  Filled 2016-01-03 (×2): qty 1

## 2016-01-03 MED ORDER — CLOPIDOGREL BISULFATE 75 MG PO TABS
75.0000 mg | ORAL_TABLET | Freq: Every day | ORAL | Status: DC
  Filled 2016-01-03: qty 1

## 2016-01-03 MED ORDER — LIDOCAINE HCL (PF) 1 % IJ SOLN
INTRAMUSCULAR | Status: AC
  Filled 2016-01-03: qty 30

## 2016-01-03 MED ORDER — VITAMIN D 1000 UNITS PO TABS
1000.0000 [IU] | ORAL_TABLET | Freq: Two times a day (BID) | ORAL | Status: DC
  Administered 2016-01-03: 21:00:00 1000 [IU] via ORAL
  Filled 2016-01-03 (×2): qty 1

## 2016-01-03 MED ORDER — SODIUM CHLORIDE 0.9% FLUSH: 3.0000 mL | INTRAVENOUS | Status: DC | PRN

## 2016-01-03 MED ORDER — SODIUM CHLORIDE 0.9 % WEIGHT BASED INFUSION: 1.0000 mL/kg/h | INTRAVENOUS | Status: DC

## 2016-01-03 MED ORDER — MULTIVITAMINS PO CAPS: 1.0000 | ORAL_CAPSULE | Freq: Every day | ORAL | Status: DC

## 2016-01-03 MED ORDER — HEPARIN SODIUM (PORCINE) 1000 UNIT/ML IJ SOLN
INTRAMUSCULAR | Status: DC | PRN
  Administered 2016-01-03: 2000 [IU] via INTRAVENOUS
  Administered 2016-01-03: 7000 [IU] via INTRAVENOUS

## 2016-01-03 MED ORDER — MIDAZOLAM HCL 2 MG/2ML IJ SOLN
INTRAMUSCULAR | Status: DC | PRN
  Administered 2016-01-03: 1 mg via INTRAVENOUS

## 2016-01-03 MED ORDER — HYDROCHLOROTHIAZIDE 25 MG PO TABS
25.0000 mg | ORAL_TABLET | Freq: Every day | ORAL | Status: DC
  Filled 2016-01-03: qty 1

## 2016-01-03 MED ORDER — FENTANYL CITRATE (PF) 100 MCG/2ML IJ SOLN
INTRAMUSCULAR | Status: DC | PRN
  Administered 2016-01-03: 25 ug via INTRAVENOUS

## 2016-01-03 MED ORDER — CLOPIDOGREL BISULFATE 75 MG PO TABS: 75.0000 mg | ORAL_TABLET | Freq: Every day | ORAL | Status: DC

## 2016-01-03 MED ORDER — TAMSULOSIN HCL 0.4 MG PO CAPS
0.4000 mg | ORAL_CAPSULE | Freq: Every day | ORAL | Status: DC
  Filled 2016-01-03: qty 1

## 2016-01-03 MED ORDER — ASPIRIN EC 81 MG PO TBEC
81.0000 mg | DELAYED_RELEASE_TABLET | Freq: Every day | ORAL | Status: DC
  Filled 2016-01-03: qty 1

## 2016-01-03 MED ORDER — HYDRALAZINE HCL 20 MG/ML IJ SOLN
10.0000 mg | Freq: Three times a day (TID) | INTRAMUSCULAR | Status: DC | PRN
Start: 2016-01-03 — End: 2016-01-04
  Administered 2016-01-03: 10 mg via INTRAVENOUS
  Filled 2016-01-03: qty 1

## 2016-01-03 MED ORDER — VITAMIN C 500 MG PO TABS
500.0000 mg | ORAL_TABLET | Freq: Two times a day (BID) | ORAL | Status: DC
  Administered 2016-01-03: 500 mg via ORAL
  Filled 2016-01-03 (×2): qty 1

## 2016-01-03 MED ORDER — ONDANSETRON HCL 4 MG/2ML IJ SOLN: 4.0000 mg | Freq: Four times a day (QID) | INTRAMUSCULAR | Status: DC | PRN

## 2016-01-03 MED ORDER — FENTANYL CITRATE (PF) 100 MCG/2ML IJ SOLN
INTRAMUSCULAR | Status: AC
  Filled 2016-01-03: qty 2

## 2016-01-03 MED ORDER — ISOSORBIDE MONONITRATE ER 30 MG PO TB24
30.0000 mg | ORAL_TABLET | Freq: Every day | ORAL | Status: DC
  Administered 2016-01-03: 30 mg via ORAL
  Filled 2016-01-03 (×2): qty 1

## 2016-01-03 MED ORDER — SODIUM CHLORIDE 0.9 % WEIGHT BASED INFUSION: 3.0000 mL/kg/h | INTRAVENOUS | Status: DC

## 2016-01-03 MED ORDER — ASPIRIN 81 MG PO CHEW: 81.0000 mg | CHEWABLE_TABLET | ORAL | Status: DC

## 2016-01-03 MED ORDER — METOPROLOL TARTRATE 25 MG PO TABS
25.0000 mg | ORAL_TABLET | Freq: Two times a day (BID) | ORAL | Status: DC
  Administered 2016-01-03: 17:00:00 25 mg via ORAL
  Filled 2016-01-03 (×2): qty 1

## 2016-01-03 MED ORDER — ACETAMINOPHEN 500 MG PO TABS: 500.0000 mg | ORAL_TABLET | Freq: Four times a day (QID) | ORAL | Status: DC | PRN

## 2016-01-03 MED ORDER — LOSARTAN POTASSIUM 50 MG PO TABS
100.0000 mg | ORAL_TABLET | Freq: Every day | ORAL | Status: DC
  Filled 2016-01-03: qty 2

## 2016-01-03 MED ORDER — AMLODIPINE BESYLATE 10 MG PO TABS
10.0000 mg | ORAL_TABLET | Freq: Every day | ORAL | Status: DC
  Administered 2016-01-03: 17:00:00 10 mg via ORAL
  Filled 2016-01-03 (×2): qty 1

## 2016-01-03 MED ORDER — ADULT MULTIVITAMIN W/MINERALS CH
1.0000 | ORAL_TABLET | Freq: Every day | ORAL | Status: DC
  Filled 2016-01-03: qty 1

## 2016-01-03 MED ORDER — ACETAMINOPHEN 325 MG PO TABS: 650.0000 mg | ORAL_TABLET | ORAL | Status: DC | PRN

## 2016-01-03 MED ORDER — ANGIOPLASTY BOOK
Freq: Once | Status: AC
  Administered 2016-01-03: 21:00:00
  Filled 2016-01-03: qty 1

## 2016-01-03 MED ORDER — GLIPIZIDE 5 MG PO TABS
5.0000 mg | ORAL_TABLET | Freq: Every day | ORAL | Status: DC
  Administered 2016-01-03: 17:00:00 5 mg via ORAL
  Filled 2016-01-03 (×2): qty 1

## 2016-01-03 MED ORDER — SODIUM CHLORIDE 0.9 % IV SOLN: INTRAVENOUS | Status: AC

## 2016-01-03 MED ORDER — LIDOCAINE HCL (PF) 1 % IJ SOLN
INTRAMUSCULAR | Status: DC | PRN
  Administered 2016-01-03: 10 mL

## 2016-01-03 MED ORDER — MORPHINE SULFATE (PF) 2 MG/ML IV SOLN: 2.0000 mg | INTRAVENOUS | Status: DC | PRN

## 2016-01-03 MED ORDER — HEPARIN (PORCINE) IN NACL 2-0.9 UNIT/ML-% IJ SOLN
INTRAMUSCULAR | Status: DC | PRN
  Administered 2016-01-03: 1000 mL

## 2016-01-03 MED ORDER — MAGNESIUM HYDROXIDE 400 MG/5ML PO SUSP: 30.0000 mL | ORAL | Status: DC | PRN

## 2016-01-03 MED ORDER — IODIXANOL 320 MG/ML IV SOLN
INTRAVENOUS | Status: DC | PRN
  Administered 2016-01-03: 140 mL via INTRA_ARTERIAL

## 2016-01-03 MED ORDER — GABAPENTIN 300 MG PO CAPS
300.0000 mg | ORAL_CAPSULE | Freq: Every day | ORAL | Status: DC
  Administered 2016-01-03: 21:00:00 300 mg via ORAL
  Filled 2016-01-03: qty 1

## 2016-01-03 MED ORDER — ASPIRIN EC 81 MG PO TBEC: 81.0000 mg | DELAYED_RELEASE_TABLET | Freq: Once | ORAL | Status: DC

## 2016-01-03 MED ORDER — NITROGLYCERIN 0.4 MG SL SUBL: 0.4000 mg | SUBLINGUAL_TABLET | SUBLINGUAL | Status: DC | PRN

## 2016-01-03 MED ORDER — MIDAZOLAM HCL 2 MG/2ML IJ SOLN
INTRAMUSCULAR | Status: AC
  Filled 2016-01-03: qty 2

## 2016-01-03 SURGICAL SUPPLY — 28 items
BALLN ARMADA 18 6.0 X 200 (BALLOONS) ×3
BALLN ARMADA 3.0X200X150 (BALLOONS) ×3
BALLN IN.PACT DCB 6X40 (BALLOONS) ×3
BALLOON ARMADA 18 6.0 X 200 (BALLOONS) ×2 IMPLANT
BALLOON ARMADA 3.0X200X150 (BALLOONS) ×2 IMPLANT
CATH CROSS OVER TEMPO 5F (CATHETERS) ×3 IMPLANT
CATH VIANCE CROSS STAND 150CM (MICROCATHETER) ×3
CATH VIANCE CROSS STD 150CM (MICROCATHETER) ×2 IMPLANT
DCB IN.PACT 6X40 (BALLOONS) ×2 IMPLANT
DEVICE CONTINUOUS FLUSH (MISCELLANEOUS) ×3 IMPLANT
DEVICE SPIDERFX EMB PROT 6MM (WIRE) ×3 IMPLANT
KIT ENCORE 26 ADVANTAGE (KITS) ×3 IMPLANT
KIT MICROINTRODUCER STIFF 5F (SHEATH) ×3 IMPLANT
KIT PV (KITS) ×3 IMPLANT
SHEATH HIGHFLEX ANSEL 7FR 55CM (SHEATH) ×6 IMPLANT
SHEATH PINNACLE 5F 10CM (SHEATH) ×3 IMPLANT
SHEATH PINNACLE 7F 10CM (SHEATH) ×3 IMPLANT
STENT VIABAHN 6X250X120 (Permanent Stent) ×3 IMPLANT
STOPCOCK MORSE 400PSI 3WAY (MISCELLANEOUS) ×3 IMPLANT
SYRINGE MEDRAD AVANTA MACH 7 (SYRINGE) ×3 IMPLANT
TAPE RADIOPAQUE TURBO (MISCELLANEOUS) ×3 IMPLANT
TRANSDUCER W/STOPCOCK (MISCELLANEOUS) ×3 IMPLANT
TRAY PV CATH (CUSTOM PROCEDURE TRAY) ×3 IMPLANT
TUBING CIL FLEX 10 FLL-RA (TUBING) ×3 IMPLANT
WIRE AMPLATZ SS-J .035X180CM (WIRE) ×3 IMPLANT
WIRE HITORQ VERSACORE ST 145CM (WIRE) ×3 IMPLANT
WIRE ROSEN-J .035X260CM (WIRE) ×3 IMPLANT
WIRE SPARTACORE .014X300CM (WIRE) ×3 IMPLANT

## 2016-01-03 NOTE — H&P (View-Only) (Signed)
12/08/2015 Robert Burns   03-Jan-1941  [id number]  Primary Physician Thurman Coyer, MD Primary Cardiologist: Lorretta Harp MD FACP, [facility], [place], [name]  HPI:  .The patient is a very pleasant 75 year old, married Caucasian male, father of 2, grandfather to 3 grandchildren whose wife [name] who is also a patient of mine. I last saw him in the office 05/26/15... He has a history of CAD status post coronary artery bypass grafting March 2004 with a LIMA to his LAD, a vein to a diagonal branch, a sequential vein to a ramus and OM branch, as well as a vein to the PDA. Last functional study performed July 28, 2011, was entirely normal. 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 He had a pacemaker placed for sick sinus syndrome November 2008 followed by Dr. Sallyanne Kuster. He has obstructive sleep apnea on CPAP. He complain of left thigh pain and I angiogram'd him revealing patent arteries though he did have a space-occupying lesion which was removed by Dr. Kellie Simmering with placement of an interposition 6-mm Gore-Tex graft. The pathology was uncertain. 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 Dr. Baxter Flattery follows his lipid profile.Since I saw him back 11/07/12 he has done well. Followup Dopplers performed in our office 09/27/12 revealed a high-grade lesion in the distal right SFA with an ABI of 0.3. His left ABI 1.1 without obstructive disease. His most recent lower extremity Doppler Dopplers performed 9/32/16 revealed a right ABI 0.82 And a left ABI of 0.89. Over the last 3 months he's noticed anginal chest pain occurring both at rest and with exertion with left upper extremity radiation. He also complains of left lower extremity discomfort. to moderate anterolateral ischemia. Because of ongoing chest pain and a Myoview that showed anterolateral ischemia and he underwent cardiac catheterization on 02/01/15 revealing high grade segmental proximal right SVG disease and subtotally  occluded diagonal branch SVG. He underwent stenting of his RCA SVG successfully. He does have continued chest pain although somewhat improved since his last procedure. I brought him back for staged diagonal branch SVG intervention on 03/01/15. This was successful and since that time he denies chest pain or shortness of breath. He underwent a generator change by Dr. Sallyanne Kuster in January for end-of-life of his Medtronic pacemaker.Since I saw him 6 months ago he developed new left calf claudication of the last 3 months. Lower extremity arterial Doppler studies performed today revealed a decline in his left ABI from 0.87 down to 0.59 with an occluded left SFA that is a new finding. 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 He had a short segment occlusion of the proximal left SFA with patent mid left SFA stent. He had 90% segmental calcified mid right SFA stenosis with three-vessel runoff bilaterally. I ended up stenting his left external iliac artery with a 9 mm x 40 mm long Nitrostat extending stent which improved his symptoms somewhat although he continues to have lifestyle limiting claudication.   Current Outpatient Prescriptions  Medication Sig Dispense Refill  . acetaminophen (TYLENOL) 500 MG tablet Take 500 mg by mouth every 6 (six) hours as needed for moderate pain or headache.     . Albuterol Sulfate (PROAIR RESPICLICK) 123XX123 (90 BASE) MCG/ACT AEPB Inhale 2 puffs into the lungs 4 (four) times daily.    [name] amLODipine (NORVASC) 10 MG tablet Take 10 mg  by mouth daily.      [name] aspirin EC 81 MG tablet Take 81 mg by mouth daily.      . B Complex Vitamins (B COMPLEX-B12 PO) Take 1 tablet by mouth daily.     . cholecalciferol (VITAMIN D) 1000 units tablet Take 1,000 Units by mouth daily.    . cloNIDine (CATAPRES) 0.1 MG tablet Take 0.1 mg by mouth 2 (two) times  daily.    . clopidogrel (PLAVIX) 75 MG tablet Take 1 tablet (75 mg total) by mouth daily with breakfast. 90 tablet 3  . Doxylamine Succinate, Sleep, (SLEEP AID PO) Take 1 tablet by mouth as needed (for sleep).     . gabapentin (NEURONTIN) 100 MG capsule Take 300 mg by mouth at bedtime.     [name] glipiZIDE (GLUCOTROL) 5 MG tablet Take 5 mg by mouth daily.      . isosorbide mononitrate (IMDUR) 30 MG 24 hr tablet Take 1 tablet (30 mg total) by mouth daily. 30 tablet 11  . losartan-hydrochlorothiazide (HYZAAR) 100-25 MG per tablet Take 1 tablet by mouth daily.      [name] lovastatin (MEVACOR) 40 MG tablet Take 40 mg by mouth at bedtime.      . magnesium hydroxide (MILK OF MAGNESIA) 400 MG/5ML suspension Take 30 mLs by mouth as needed for moderate constipation or indigestion.     . metFORMIN (GLUCOPHAGE) 1000 MG tablet Take 1,000 mg by mouth 2 (two) times daily with a meal.      . metoprolol tartrate (LOPRESSOR) 25 MG tablet Take 25 mg by mouth 2 (two) times daily.      . Multiple Vitamin (MULTIVITAMIN) capsule Take 1 capsule by mouth daily.      . nitroGLYCERIN (NITROSTAT) 0.4 MG SL tablet Place 1 tablet (0.4 mg total) under the tongue every 5 (five) minutes as needed for chest pain. 25 tablet 3  . Omega-3 Fatty Acids (FISH OIL PO) Take 1 tablet by mouth daily.    . tamsulosin (FLOMAX) 0.4 MG CAPS capsule Take 0.4 mg by mouth daily.    . vitamin C (ASCORBIC ACID) 500 MG tablet Take 500 mg by mouth 2 (two) times daily.       No current facility-administered medications for this visit.     Allergies  Allergen Reactions  . Peanut-Containing Drug Products Anaphylaxis and Other (See Comments)    Tongue swelling is severe  . Ace Inhibitors Other (See Comments)    Cough  . Clonidine Hcl Other (See Comments)    Patch only - skin irritation  . Coreg [Carvedilol] Itching  . Diltiazem Hcl Other (See Comments)    Unknown  . Mobic [Meloxicam] Other (See Comments)    GI upset  . Ramipril Other (See Comments)      Unknown    Social History   Social History  . Marital status: Married    Spouse name: N/A  . Number of children: N/A  . Years of education: N/A   Occupational History  . Not on file.   Social History Main Topics  . Smoking status: Former Smoker    Years: 10.00    Types: Pipe  . Smokeless tobacco: Never Used     Comment: "quit smoking in 1973"  . Alcohol use No  . Drug use: No  . Sexual activity: Not Currently   Other Topics Concern  . Not on file   Social History Narrative  . No narrative on file     Review of Systems: General:  negative for chills, fever, night sweats or weight changes.  Cardiovascular: negative for chest pain, dyspnea on exertion, edema, orthopnea, palpitations, paroxysmal nocturnal dyspnea or shortness of breath Dermatological: negative for rash Respiratory: negative for cough or wheezing Urologic: negative for hematuria Abdominal: negative for nausea, vomiting, diarrhea, bright red blood per rectum, melena, or hematemesis Neurologic: negative for visual changes, syncope, or dizziness All other systems reviewed and are otherwise negative except as noted above.    Blood pressure 132/80, pulse 68, height 5\' 7"  (1.702 m), weight 177 lb 9.6 oz (80.6 kg).  General appearance: alert and no distress Neck: no adenopathy, no carotid bruit, no JVD, supple, symmetrical, trachea midline and thyroid not enlarged, symmetric, no tenderness/mass/nodules Lungs: clear to auscultation bilaterally Heart: regular rate and rhythm, S1, S2 normal, no murmur, click, rub or gallop Extremities: extremities normal, atraumatic, no cyanosis or edema and Right femoral arterial puncture site is well-healed. He has a 2+ left common femoral pulse without bruit.  EKG normal sinus rhythm at 68 with septal Q waves. 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 Gledhill returns today after  his recent lower extremity angiogram and intervention which I performed 11/25/15. He had a prior interposition Dacron graft placed by Dr. Kellie Simmering remotely as well as a mid left SFA stent which I placed. He had progressive claudication with Dopplers that showed a decline in ABI and increase in frequency in his left external iliac artery. Angiogram him 11/25/15 and placed a 9 mm x 40 mm long nitinol self expanding stent in his left external iliac artery reducing 90% stenosis to 0% residual. He does have a short segment occlusion of his proximal left SFA just above a patent mid left SFA stent with three-vessel runoff. In addition, he has a 90% segmental mid right SFA calcified stenosis. He continues to complain of claudication. I did use 190 mL of contrast during the case and therefore did not attempt the left SFA CTO. The patient wishes to have this addressed in the near future.      Lorretta Harp MD FACP,FACC,FAHA, [facility] 12/08/2015 8:08 AM

## 2016-01-03 NOTE — Interval H&P Note (Signed)
History and Physical Interval Note:  01/03/2016 1:44 PM  Robert Burns  has presented today for surgery, with the diagnosis of pvd  The various methods of treatment have been discussed with the patient and family. After consideration of risks, benefits and other options for treatment, the patient has consented to  Procedure(s): Lower Extremity Angiography (N/A) as a surgical intervention .  The patient's history has been reviewed, patient examined, no change in status, stable for surgery.  I have reviewed the patient's chart and labs.  Questions were answered to the patient's satisfaction.     Quay Burow

## 2016-01-03 NOTE — Care Management Note (Signed)
Case Management Note  Patient Details  Name: Robert Burns MRN: [id number] Date of Birth: February 23, 1941  Subjective/Objective:  S/p peripheral vascular intervention, already on plavix and asa, NCM will cont to follow for dc needs.                    Action/Plan:   Expected Discharge Date:                  Expected Discharge Plan:  Home/Self Care  In-House Referral:     Discharge planning Services  CM Consult  Post Acute Care Choice:    Choice offered to:     DME Arranged:    DME Agency:     HH Arranged:    HH Agency:     Status of Service:  In process, will continue to follow  If discussed at Long Length of Stay Meetings, dates discussed:    Additional Comments:  Zenon Mayo, RN 01/03/2016, 4:17 PM

## 2016-01-03 NOTE — Progress Notes (Signed)
Site area: right groin  Site Prior to Removal:  Level 0  Pressure Applied For 20 MINUTES    Minutes Beginning at 19:23  Manual:   Yes.    Patient Status During Pull:  WNL  Post Pull Groin Site:  Level 0  Post Pull Instructions Given:  Yes.    Post Pull Pulses Present:  Yes.    Dressing Applied:  Yes.    Comments:  Pt tolerated well. No bleeding; No hematoma.

## 2016-01-04 ENCOUNTER — Other Ambulatory Visit: Payer: Self-pay | Admitting: Cardiology

## 2016-01-04 ENCOUNTER — Encounter (HOSPITAL_COMMUNITY): Payer: Self-pay | Admitting: Cardiovascular Disease

## 2016-01-04 DIAGNOSIS — I739 Peripheral vascular disease, unspecified: Secondary | ICD-10-CM

## 2016-01-04 DIAGNOSIS — I70212 Atherosclerosis of native arteries of extremities with intermittent claudication, left leg: Secondary | ICD-10-CM | POA: Diagnosis not present

## 2016-01-04 LAB — CBC
HCT: 38.1 % — ABNORMAL LOW (ref 39.0–52.0)
Hemoglobin: 12.2 g/dL — ABNORMAL LOW (ref 13.0–17.0)
MCH: 28 pg (ref 26.0–34.0)
MCHC: 32 g/dL (ref 30.0–36.0)
MCV: 87.4 fL (ref 78.0–100.0)
PLATELETS: 195 10*3/uL (ref 150–400)
RBC: 4.36 MIL/uL (ref 4.22–5.81)
RDW: 14.9 % (ref 11.5–15.5)
WBC: 13 10*3/uL — AB (ref 4.0–10.5)

## 2016-01-04 LAB — GLUCOSE, CAPILLARY: Glucose-Capillary: 147 mg/dL — ABNORMAL HIGH (ref 65–99)

## 2016-01-04 LAB — BASIC METABOLIC PANEL
Anion gap: 7 (ref 5–15)
BUN: 17 mg/dL (ref 6–20)
CALCIUM: 9.8 mg/dL (ref 8.9–10.3)
CHLORIDE: 107 mmol/L (ref 101–111)
CO2: 27 mmol/L (ref 22–32)
CREATININE: 0.88 mg/dL (ref 0.61–1.24)
GFR calc Af Amer: >60 mL/min (ref >60)
GFR calc non Af Amer: >60 mL/min (ref >60)
Glucose, Bld: 158 mg/dL — ABNORMAL HIGH (ref 65–99)
Potassium: 3.8 mmol/L (ref 3.5–5.1)
SODIUM: 141 mmol/L (ref 135–145)

## 2016-01-04 LAB — POCT ACTIVATED CLOTTING TIME
Activated Clotting Time: 224 seconds
Activated Clotting Time: 235 seconds

## 2016-01-04 MED ORDER — METOPROLOL TARTRATE 50 MG PO TABS: 50.0000 mg | ORAL_TABLET | Freq: Two times a day (BID) | ORAL | 12 refills | Status: DC

## 2016-01-04 NOTE — Discharge Summary (Signed)
Discharge Summary    Patient ID: SYION Robert Burns,  MRN: [id number], DOB/AGE: 77-Jan-1942 77 y.o.  Admit date: 01/03/2016 Discharge date: 01/04/2016  Primary Care Provider: [id number] L Primary Cardiologist: Dr. Gwenlyn Found  Discharge Diagnoses    Active Problems:   PVD (peripheral vascular disease) ([place])   Claudication ([place])   Allergies Allergies  Allergen Reactions  . Peanut-Containing Drug Products Anaphylaxis and Other (See Comments)    Tongue swelling is severe  . Ace Inhibitors Other (See Comments)    Cough  . Clonidine Hcl Other (See Comments)    Patch only - skin irritation  . Coreg [Carvedilol] Itching  . Diltiazem Hcl Other (See Comments)    Unknown  . Mobic [Meloxicam] Other (See Comments)    GI upset  . Ramipril Other (See Comments)    Unknown    Diagnostic Studies/Procedures   Lower Extremity Angiography  Peripheral Vascular Intervention   Procedures Performed:            1. Contralateral access with a 7 [place] crossover sheath (second order catheter placement)            2. Crossing left SFA CTO with a Viance CTO catheter, placement of 6 mm spider distal protection device in above-the-knee popliteal           artery            3. PTA left SFA            4. Placement of a 6 mm x 250 mm long Viabahn  covered stent left SFA, drug eluted balloon angioplasty left SFA            5. Retrieval distal protection device, completion angiography  Final Impression: Successful PTA and covered stenting using viable and covered stent of a long segment CTO left SFA using distal protection and drug eluting balloon angioplasty  for lifestyle limiting claudication. The patient is already on dual antiplatelet therapy. He'll be hydrated overnight, discharged on the morning. We will get lower extremity arterial Doppler studies on him in the [facility] office next week and I will see him back 2-3 weeks thereafter. At that time we will discuss staged intervention of his right  SFA.  _____________   History of Present Illness   The patient is a very pleasant 75 year old, married Caucasian male, father of 2, grandfather to 3 grandchildren whose wife [name] who is also a patient of Dr. Gwenlyn Found. Dr. Gwenlyn Found saw him in the office 05/26/15.  He has a history of CAD status post coronary artery bypass grafting March 2004 with a LIMA to his LAD, a vein to a diagonal branch, a sequential vein to a ramus and OM branch, as well as a vein to the PDA. Last functional study performed July 28, 2011, was entirely normal. 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 Gwenlyn Found in October 30, 2002.   He had a pacemaker placed for sick sinus syndrome November 2008 followed by Dr. Sallyanne Kuster. He has obstructive sleep apnea on CPAP. He complains of left thigh pain and Dr. Gwenlyn Found angiogram'd him revealing patent arteries though he did have a space-occupying lesion which was removed by Dr. Kellie Simmering with placement of an interposition 6-mm Gore-Tex graft. The pathology was uncertain.   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 Dr. Baxter Flattery follows his lipid profile.Since Dr. Gwenlyn Found saw him back 11/07/12 he has done well.Followup Dopplers performed in our office 09/27/12 revealed a high-grade lesion in the distal  right SFA with an ABI of 0.3. His left ABI 1.1 without obstructive disease. His most recent lower extremity Doppler Dopplers performed 9/32/16 revealed a right ABI 0.82 And a left ABI of 0.89.   Over the last 3 months he's noticed anginal chest pain occurring both at rest and with exertion with left upper extremity radiation. He also complains of left lower extremity discomfort. to moderate anterolateral ischemia. Because of ongoing chest pain and a Myoview that showed anterolateral ischemia and he underwent cardiac catheterization on 02/01/15 revealing high grade segmental proximal right SVG disease and subtotally occluded diagonal branch SVG. He underwent stenting of his RCA SVG successfully.  He does have continued chest pain although somewhat improved since his last procedure.   Dr. Gwenlyn Found brought him back for staged diagonal branch SVG intervention on 03/01/15. This was successful and since that time he denies chest pain or shortness of breath. He underwent a generator change by Dr. Sallyanne Kuster in January 2017 for end-of-life of his Medtronic pacemaker.Since Dr. Gwenlyn Found saw him 6 months ago he developed new left calf claudication of the last 3 months. Lower extremity arterial Doppler studies performed today revealed a decline in his left ABI from 0.87 down to 0.59 with an occluded left SFA that is a new finding.   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 He had a short segment occlusion of the proximal left SFA with patent mid left SFA stent. He had 90% segmental calcified mid right SFA stenosis with three-vessel runoff bilaterally. Dr. Gwenlyn Found ended up stenting his left external iliac artery with a 9 mm x 40 mm long nitinol self-expanding  stent which improved his symptoms somewhat although he continues to have lifestyle limiting claudication. He presented on 01/03/16 for percutaneous revascularization of his left SFA CTO.   Hospital Course  He underwent successful PTA of left SFA, and stent placement in left SFA.   He was stable post procedure. Right groin site is stable with no hematoma.   He was hypertensive post procedure and overnight. He will continue his amlodipine, catapres and metoprolol. We will increase his metoprolol to 50mg  BID.   He will have bilateral lower extremity dopplers in one week and see Dr. Gwenlyn Found 2-3 weeks thereafter. He was instructed to not take his metformin until 01/05/16 am.  He was seen today by Dr. Ellyn Hack and deemed suitable for discharge.  _____________  Discharge Vitals Blood pressure (!) 148/50, pulse  61, temperature 98.3 F (36.8 C), temperature source Oral, resp. rate 19, height 5\' 8"  (1.727 m), weight 165 lb 5.5 oz (75 kg), SpO2 95 %.  Filed Weights   01/03/16 1116 01/04/16 0553  Weight: 175 lb (79.4 kg) 165 lb 5.5 oz (75 kg)   GEN: Well nourished, well developed, in no acute distress.  HEENT: Grossly normal.  Neck: Supple, no JVD, carotid bruits, or masses. Cardiac: RRR, no murmurs, rubs, or gallops. No clubbing, cyanosis, edema.  Radials/DP/PT 2+ and equal bilaterally.  Respiratory:  Respirations regular and unlabored, clear to auscultation bilaterally. GI: Soft, nontender, nondistended, BS + x 4. MS: no deformity or atrophy. Skin: warm and dry, no rash. Neuro:  Strength and sensation are intact. Psych: AAOx3.  Normal affect.  Labs & Radiologic Studies     CBC  Recent Labs  01/03/16 1134 01/04/16 0350  WBC 9.1 13.0*  HGB 12.3* 12.2*  HCT 37.9* 38.1*  MCV 86.9  87.4  PLT 179 0000000   Basic Metabolic Panel  Recent Labs  01/03/16 1134 01/04/16 0350  NA 139 141  K 3.9 3.8  CL 105 107  CO2 25 27  GLUCOSE 152* 158*  BUN 16 17  CREATININE 0.95 0.88  CALCIUM 9.6 9.8   Dg Chest 2 View  Result Date: 12/20/2015 CLINICAL DATA:  Preprocedure for stent placement. Coronary artery disease. Pacemaker placement in 2017. History of hypertension, diabetes EXAM: CHEST  2 VIEW COMPARISON:  01/27/2015 FINDINGS: PA and lateral views chest. Postsurgical changes of the mediastinum are again evident. A right-sided pacer device is present, generator now obliquely oriented. Intracardiac lead similar compared to prior with leads projecting over right atrium and right ventricle. No acute pulmonary infiltrate, consolidation, or effusion. Cardiomediastinal silhouette similar compared to prior. No pneumothorax. There are degenerative changes of the spine. IMPRESSION: 1. No radiographic evidence for acute cardiopulmonary abnormality 2. Pacer generator right upper chest has changed in orientation,  intracardiac pacing leads overlie right atrium and right ventricle. Electronically Signed   By: Donavan Foil M.D.   On: 12/20/2015 14:42    Disposition   Pt is being discharged home today in good condition.  Follow-up Plans & Appointments    Follow-up Information    Quay Burow, MD Follow up on 01/26/2016.   Specialties:  Cardiology, Radiology Why:  at 10:30 am for follow up.  Contact information: [address] [place] [address] [phone]        [facility] Follow up on 01/11/2016.   Specialty:  Cardiology Why:  at 1:30pm for doppler studies.  Contact information: [address] [place] [phone]         Discharge Instructions    Diet - low sodium heart healthy    Complete by:  As directed    Increase activity slowly    Complete by:  As directed       Discharge Medications   Current Discharge Medication List    CONTINUE these medications which have CHANGED   Details  metoprolol tartrate (LOPRESSOR) 50 MG tablet Take 1 tablet (50 mg total) by mouth 2 (two) times daily. Qty: 60 tablet, Refills: 12      CONTINUE these medications which have NOT CHANGED   Details  acetaminophen (TYLENOL) 500 MG tablet Take 500 mg by mouth every 6 (six) hours as needed for moderate pain or headache.     amLODipine (NORVASC) 10 MG tablet Take 10 mg by mouth daily.      aspirin EC 81 MG tablet Take 81 mg by mouth daily.      B Complex Vitamins (B COMPLEX-B12 PO) Take 1 tablet by mouth daily.     cholecalciferol (VITAMIN D) 1000 units tablet Take 1,000 Units by mouth 2 (two) times daily.     cloNIDine (CATAPRES) 0.1 MG tablet Take 0.1 mg by mouth 2 (two) times daily.    clopidogrel (PLAVIX) 75 MG tablet Take 1 tablet (75 mg total) by mouth daily with breakfast. Qty: 90 tablet, Refills: 3    Doxylamine Succinate, Sleep, (SLEEP AID PO) Take 1 tablet by mouth as needed (for sleep).     gabapentin  (NEURONTIN) 100 MG capsule Take 300 mg by mouth at bedtime.     glipiZIDE (GLUCOTROL) 5 MG tablet Take 5 mg by mouth daily.      isosorbide mononitrate (IMDUR) 30 MG 24 hr tablet Take 1 tablet (30 mg total) by mouth daily. Qty:  30 tablet, Refills: 11    losartan-hydrochlorothiazide (HYZAAR) 100-25 MG per tablet Take 1 tablet by mouth daily.      lovastatin (MEVACOR) 40 MG tablet Take 40 mg by mouth at bedtime.      metFORMIN (GLUCOPHAGE) 1000 MG tablet Take 1,000 mg by mouth 2 (two) times daily with a meal.      Multiple Vitamin (MULTIVITAMIN) capsule Take 1 capsule by mouth daily.      nitroGLYCERIN (NITROSTAT) 0.4 MG SL tablet Place 1 tablet (0.4 mg total) under the tongue every 5 (five) minutes as needed for chest pain. Qty: 25 tablet, Refills: 3    Omega-3 Fatty Acids (FISH OIL PO) Take 1 tablet by mouth daily.    tamsulosin (FLOMAX) 0.4 MG CAPS capsule Take 0.4 mg by mouth daily.    vitamin C (ASCORBIC ACID) 500 MG tablet Take 500 mg by mouth 2 (two) times daily.      magnesium hydroxide (MILK OF MAGNESIA) 400 MG/5ML suspension Take 30 mLs by mouth as needed for moderate constipation or indigestion.           Outstanding Labs/Studies  Bilateral LE arterial dopplers in one week.   Duration of Discharge Encounter   Greater than 30 minutes including physician time.  Signed, Arbutus Leas NP 01/04/2016, 11:21 AM

## 2016-01-04 NOTE — Progress Notes (Signed)
Patient Name: Robert Burns Date of Encounter: 01/04/2016  Primary Cardiologist: Dr. Serena Burns Problem List     Active Problems:   PVD (peripheral vascular disease) ([place])   Claudication ([place])     Subjective   Feels well, no pain at femoral site.   Inpatient Medications    Scheduled Meds: . amLODipine  10 mg Oral Daily  . aspirin EC  81 mg Oral Daily  . aspirin EC  81 mg Oral Once  . cholecalciferol  1,000 Units Oral BID  . cloNIDine  0.1 mg Oral BID  . clopidogrel  75 mg Oral Q breakfast  . gabapentin  300 mg Oral QHS  . glipiZIDE  5 mg Oral Daily  . losartan  100 mg Oral Daily   And  . hydrochlorothiazide  25 mg Oral Daily  . isosorbide mononitrate  30 mg Oral Daily  . metoprolol tartrate  25 mg Oral BID  . multivitamin with minerals  1 tablet Oral Daily  . tamsulosin  0.4 mg Oral Daily  . vitamin C  500 mg Oral BID   Continuous Infusions:  PRN Meds:.acetaminophen, hydrALAZINE, magnesium hydroxide, morphine injection, nitroGLYCERIN, ondansetron (ZOFRAN) IV   Vital Signs    Vitals:   01/03/16 2000 01/03/16 2109 01/04/16 0553 01/04/16 0721  BP: (!) 174/72  (!) 148/46   Pulse: (!) 56 60 61   Resp: 19 16 19    Temp:   98.4 F (36.9 C) 98.3 F (36.8 C)  TempSrc:   Oral Oral  SpO2: 96% 95% 96%   Weight:   165 lb 5.5 oz (75 kg)   Height:        Intake/Output Summary (Last 24 hours) at 01/04/16 0746 Last data filed at 01/04/16 0557  Gross per 24 hour  Intake           783.75 ml  Output             1500 ml  Net          -716.25 ml   Filed Weights   01/03/16 1116 01/04/16 0553  Weight: 175 lb (79.4 kg) 165 lb 5.5 oz (75 kg)    Physical Exam   GEN: Well nourished, well developed, in no acute distress.  HEENT: Grossly normal.  Neck: Supple, no JVD, carotid bruits, or masses. Cardiac: RRR, no murmurs, rubs, or gallops. No clubbing, cyanosis, edema.  Radials/DP/PT 2+ and equal bilaterally.  Respiratory:  Respirations regular and unlabored, clear  to auscultation bilaterally. GI: Soft, nontender, nondistended, BS + x 4. MS: no deformity or atrophy. Skin: warm and dry, no rash. Neuro:  Strength and sensation are intact. Psych: AAOx3.  Normal affect.  Labs    CBC  Recent Labs  01/03/16 1134 01/04/16 0350  WBC 9.1 13.0*  HGB 12.3* 12.2*  HCT 37.9* 38.1*  MCV 86.9 87.4  PLT 179 0000000   Basic Metabolic Panel  Recent Labs  01/03/16 1134 01/04/16 0350  NA 139 141  K 3.9 3.8  CL 105 107  CO2 25 27  GLUCOSE 152* 158*  BUN 16 17  CREATININE 0.95 0.88  CALCIUM 9.6 9.8     Telemetry    NSR, atrial paced - Personally Reviewed  ECG     NSR, atrial paced- Personally Reviewed  Radiology      Cardiac Studies   Lower Extremity Angiography  Peripheral Vascular Intervention 01/03/16   Procedures Performed:            1.  Contralateral access with a 7 [place] crossover sheath (second order catheter placement)            2. Crossing left SFA CTO with a Viance CTO catheter, placement of 6 mm spider distal protection device in above-the-knee popliteal           artery            3. PTA left SFA            4. Placement of a 6 mm x 250 mm long Viabahn  covered stent left SFA, drug eluted balloon angioplasty left SFA            5. Retrieval distal protection device, completion angiography  Final Impression: Successful PTA and covered stenting using viable and covered stent of a long segment CTO left SFA using distal protection and drug eluting balloon angioplasty  for lifestyle limiting claudication. The patient is already on dual antiplatelet therapy. He'll be hydrated overnight, discharged on the morning. We will get lower extremity arterial Doppler studies on him in the [facility] office next week and I will see him back 2-3 weeks thereafter. At that time we will discuss staged intervention of his right SFA.   Patient Profile  Mr. Robert Burns is a 75 year old male with a past medical history of CAD status post coronary artery  bypass grafting March 2004 with a LIMA to his LAD, a vein to a diagonal branch, a sequential vein to a ramus and OM branch, as well as a vein to the PDA.   He presented for PV angiogram on 01/03/16  Assessment & Plan  1. PAD: Full PV report above. He had successful stenting to his left SFA. He will need to be seen in 2-3 weeks by Dr. Gwenlyn Burns. Will order dopplers for next week.  Continue Plavix and ASA.   2. HTN: Hypertensive. Continue amlodipine, catapres, and metoprolol. Consider increasing metoprolol to 50mg  BID.   3. OSA : Compliant with CPAP.   4. SSS: Generator changed in Jan. 2017.   Signed, Arbutus Leas, NP  01/04/2016, 7:46 AM

## 2016-01-04 NOTE — Care Management Note (Signed)
Case Management Note  Patient Details  Name: Robert Burns MRN: [id number] Date of Birth: December 07, 1940  Subjective/Objective:    S/p peripheral vascular intervention, already on plavix and asa, for dc today, no needs.                Action/Plan:   Expected Discharge Date:                  Expected Discharge Plan:  Home/Self Care  In-House Referral:     Discharge planning Services  CM Consult  Post Acute Care Choice:    Choice offered to:     DME Arranged:    DME Agency:     HH Arranged:    HH Agency:     Status of Service:  Completed, signed off  If discussed at [employer] of Stay Meetings, dates discussed:    Additional Comments:  Zenon Mayo, RN 01/04/2016, 10:18 AM

## 2016-01-05 ENCOUNTER — Other Ambulatory Visit: Payer: Self-pay | Admitting: Cardiology

## 2016-01-05 DIAGNOSIS — I739 Peripheral vascular disease, unspecified: Secondary | ICD-10-CM

## 2016-01-11 ENCOUNTER — Other Ambulatory Visit: Payer: Self-pay | Admitting: Cardiovascular Disease

## 2016-01-11 ENCOUNTER — Ambulatory Visit (HOSPITAL_COMMUNITY)
Admission: RE | Admit: 2016-01-11 | Discharge: 2016-01-11 | Disposition: A | Discharge status: Home / Self Care | Payer: Medicare HMO | Source: Ambulatory Visit | Attending: Cardiovascular Disease | Admitting: Cardiovascular Disease

## 2016-01-11 DIAGNOSIS — I700 Atherosclerosis of aorta: Secondary | ICD-10-CM | POA: Diagnosis not present

## 2016-01-11 DIAGNOSIS — R9439 Abnormal result of other cardiovascular function study: Secondary | ICD-10-CM | POA: Insufficient documentation

## 2016-01-11 DIAGNOSIS — R0989 Other specified symptoms and signs involving the circulatory and respiratory systems: Secondary | ICD-10-CM

## 2016-01-11 DIAGNOSIS — I739 Peripheral vascular disease, unspecified: Secondary | ICD-10-CM | POA: Diagnosis present

## 2016-01-11 DIAGNOSIS — I6522 Occlusion and stenosis of left carotid artery: Secondary | ICD-10-CM | POA: Diagnosis not present

## 2016-01-11 DIAGNOSIS — Z95828 Presence of other vascular implants and grafts: Secondary | ICD-10-CM | POA: Insufficient documentation

## 2016-01-26 ENCOUNTER — Encounter: Payer: Self-pay | Admitting: Cardiovascular Disease

## 2016-01-26 ENCOUNTER — Other Ambulatory Visit: Payer: Self-pay | Admitting: *Deleted

## 2016-01-26 ENCOUNTER — Ambulatory Visit (INDEPENDENT_AMBULATORY_CARE_PROVIDER_SITE_OTHER): Payer: Medicare HMO | Admitting: Cardiovascular Disease

## 2016-01-26 VITALS — BP 132/70 | HR 88 | Ht 68.0 in | Wt 177.6 lb

## 2016-01-26 DIAGNOSIS — Z01818 Encounter for other preprocedural examination: Secondary | ICD-10-CM | POA: Diagnosis not present

## 2016-01-26 DIAGNOSIS — Z7902 Long term (current) use of antithrombotics/antiplatelets: Secondary | ICD-10-CM | POA: Diagnosis not present

## 2016-01-26 DIAGNOSIS — I739 Peripheral vascular disease, unspecified: Secondary | ICD-10-CM

## 2016-01-26 NOTE — Progress Notes (Signed)
01/26/2016 Robert Burns   12/05/1940  [id number]  Primary Physician Robert Coyer, MD Primary Cardiologist: Robert Harp MD Robert Burns  HPI:  The patient is a very pleasant 75 year old, married Caucasian male, father of 33, grandfather to 3 grandchildren whose wife Robert Burns who is also a patient of mine. I last saw him in the office 12/08/15.[name] He has a history of CAD status post coronary artery bypass grafting March 2004 with a LIMA to his LAD, a vein to a diagonal branch, a sequential vein to a ramus and OM branch, as well as a vein to the PDA. Last functional study performed July 28, 2011, was entirely normal. 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 He had a pacemaker placed for sick sinus syndrome November 2008 followed by Dr. Sallyanne Burns. He has obstructive sleep apnea on CPAP. He complain of left thigh pain and I angiogram'd him revealing patent arteries though he did have a space-occupying lesion which was removed by Dr. Kellie Burns with placement of an interposition 6-mm Gore-Tex graft. The pathology was uncertain. 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 Dr. Baxter Burns follows his lipid profile.Since I saw him back 11/07/12 he has done well. Followup Dopplers performed in our office 09/27/12 revealed a high-grade lesion in the distal right SFA with an ABI of 0.3. His left ABI 1.1 without obstructive disease. His most recent lower extremity Doppler Dopplers performed 9/32/16 revealed a right ABI 0.82 And a left ABI of 0.89. Over the last 3 months he's noticed anginal chest pain occurring both at rest and with exertion with left upper extremity radiation. He also complains of left lower extremity discomfort. to moderate anterolateral ischemia. Because of ongoing chest pain and a Myoview that showed anterolateral ischemia and he underwent cardiac catheterization on 02/01/15 revealing high grade segmental proximal right SVG disease and subtotally  occluded diagonal branch SVG. He underwent stenting of his RCA SVG successfully. He does have continued chest pain although somewhat improved since his last procedure. I brought him back for staged diagonal branch SVG intervention on 03/01/15. This was successful and since that time he denies chest pain or shortness of breath. He underwent a generator change by Dr. Sallyanne Burns in January for end-of-life of his Medtronic pacemaker.Since I saw him 6 months ago he developed new left calf claudication of the last 3 months. Lower extremity arterial Doppler studies performed today revealed a decline in his left ABI from 0.87 down to 0.59 with an occluded left SFA that is a new finding. 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 He had a short segment occlusion of the proximal left SFA with patent mid left SFA stent. He had 90% segmental calcified mid right SFA stenosis with three-vessel runoff bilaterally. I ended up stenting his left external iliac artery with a 9 mm x 40 mm long nitinol self-expanding  stent which improved his symptoms somewhat although he continued to have lifestyle limiting claudication. He underwent staged intervention of his left SFA on 01/03/16. I stented the entire quadrant totally occluded segment with a 6 mm x 250 mm long Viabahn  Stent. His claudication on the left  has resolved and his ABIs normalized. He now complains of right leg medication. I did demonstrate a 90% calcified segmental mid right SFA stenosis at the time of angiography 11/25/15. He will be  diamondback orbital rotation arthrectomy, PT A with drug-eluting balloon.  Current Outpatient Prescriptions  Medication Sig Dispense Refill  . acetaminophen (TYLENOL) 500 MG tablet Take 500 mg by mouth every 6 (six) hours as needed for moderate pain or headache.     [name] amLODipine (NORVASC)  10 MG tablet Take 10 mg by mouth daily.      [name] aspirin EC 81 MG tablet Take 81 mg by mouth daily.      . B Complex Vitamins (B COMPLEX-B12 PO) Take 1 tablet by mouth daily.     . cholecalciferol (VITAMIN D) 1000 units tablet Take 1,000 Units by mouth 2 (two) times daily.     . cloNIDine (CATAPRES) 0.1 MG tablet Take 0.1 mg by mouth 2 (two) times daily.    . clopidogrel (PLAVIX) 75 MG tablet Take 1 tablet (75 mg total) by mouth daily with breakfast. 90 tablet 3  . Doxylamine Succinate, Sleep, (SLEEP AID PO) Take 1 tablet by mouth as needed (for sleep).     . gabapentin (NEURONTIN) 100 MG capsule Take 300 mg by mouth at bedtime.     [name] glipiZIDE (GLUCOTROL) 5 MG tablet Take 5 mg by mouth daily.      . isosorbide mononitrate (IMDUR) 30 MG 24 hr tablet Take 1 tablet (30 mg total) by mouth daily. 30 tablet 11  . losartan-hydrochlorothiazide (HYZAAR) 100-25 MG per tablet Take 1 tablet by mouth daily.      [name] lovastatin (MEVACOR) 40 MG tablet Take 40 mg by mouth at bedtime.      . magnesium hydroxide (MILK OF MAGNESIA) 400 MG/5ML suspension Take 30 mLs by mouth as needed for moderate constipation or indigestion.     . metFORMIN (GLUCOPHAGE) 1000 MG tablet Take 1,000 mg by mouth 2 (two) times daily with a meal.      . metoprolol tartrate (LOPRESSOR) 50 MG tablet Take 1 tablet (50 mg total) by mouth 2 (two) times daily. 60 tablet 12  . Multiple Vitamin (MULTIVITAMIN) capsule Take 1 capsule by mouth daily.      . nitroGLYCERIN (NITROSTAT) 0.4 MG SL tablet Place 1 tablet (0.4 mg total) under the tongue every 5 (five) minutes as needed for chest pain. 25 tablet 3  . Omega-3 Fatty Acids (FISH OIL PO) Take 1 tablet by mouth daily.    . tamsulosin (FLOMAX) 0.4 MG CAPS capsule Take 0.4 mg by mouth 2 (two) times daily.     . vitamin C (ASCORBIC ACID) 500 MG tablet Take 500 mg by mouth 2 (two) times daily.       No current facility-administered medications for this visit.     Allergies  Allergen Reactions  .  Peanut-Containing Drug Products Anaphylaxis and Other (See Comments)    Tongue swelling is severe  . Ace Inhibitors Other (See Comments)    Cough  . Clonidine Hcl Other (See Comments)    Patch only - skin irritation  . Coreg [Carvedilol] Itching  . Diltiazem Hcl Other (See Comments)    Unknown  . Mobic [Meloxicam] Other (See Comments)    GI upset  . Ramipril Other (See Comments)    Unknown    Social History   Social History  . Marital status: Married    Spouse name: N/A  . Number of children: N/A  . Years of education: N/A   Occupational History  . Not on file.   Social History Main Topics  . Smoking status: Former Smoker    Years: 10.00  Types: Pipe  . Smokeless tobacco: Never Used     Comment: "quit smoking in 1973"  . Alcohol use No  . Drug use: No  . Sexual activity: Not Currently   Other Topics Concern  . Not on file   Social History Narrative  . No narrative on file     Review of Systems: General: negative for chills, fever, night sweats or weight changes.  Cardiovascular: negative for chest pain, dyspnea on exertion, edema, orthopnea, palpitations, paroxysmal nocturnal dyspnea or shortness of breath Dermatological: negative for rash Respiratory: negative for cough or wheezing Urologic: negative for hematuria Abdominal: negative for nausea, vomiting, diarrhea, bright red blood per rectum, melena, or hematemesis Neurologic: negative for visual changes, syncope, or dizziness All other systems reviewed and are otherwise negative except as noted above.    Blood pressure 132/70, pulse 88, height 5\' 8"  (1.727 m), weight 177 lb 9.6 oz (80.6 kg).  General appearance: alert and no distress Neck: no adenopathy, no carotid bruit, no JVD, supple, symmetrical, trachea midline and thyroid not enlarged, symmetric, no tenderness/mass/nodules Lungs: clear to auscultation bilaterally Heart: regular rate and rhythm, S1, S2 normal, no murmur, click, rub or  gallop Extremities: extremities normal, atraumatic, no cyanosis or edema and 2+ left pedal pulse.  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 Coxey returns today for follow-up of his recent endovascular procedure which I performed on 01/03/16. He has a history of left SFA stenting which I performed 10/30/02. He's had an interposition graft placed by Dr. Kellie Burns of his left SFA because of a "space-occupying lesion. Because of progressive claudication on the left eye angiogram to H/17/17 and stented his left common femoral artery with a 9 mm x 40 mm stent. I did demonstrate an occluded left SFA as well as a 90% calcified mid right SFA stenosis with three-vessel runoff bilaterally. I came back on 01/03/16 and stented his entire left SFA with a 6 mm x 250 mm long Viabahn covered stent. His claudication has completely resolved. His ABI on the left side has normalized. He now has lifestyle limiting claudication on the right and wishes that revascularized.      Robert Harp MD FACP,FACC,FAHA, [facility] 01/26/2016 11:13 AM

## 2016-01-26 NOTE — Patient Instructions (Addendum)
Medication Instructions:  No changes.  PRIOR TO THE PROCEDURE: 1- DO NOT TAKE YOUR Metformin the day before or morning of the procedure. 2- DO NOT TAKE YOUR Glipizide the morning of the procedure.   Testing/Procedures: Dr. Gwenlyn Found has ordered a peripheral angiogram to be done at [facility].  This procedure is going to look at the bloodflow in your lower extremities.  If Dr. Gwenlyn Found is able to open up the arteries, you will have to spend one night in the hospital.  If he is not able to open the arteries, you will be able to go home that same day.    After the procedure, you will not be allowed to drive for 3 days or push, pull, or lift anything greater than 10 lbs for one week.    You will be required to have the following tests prior to the procedure:  1. Blood work-the blood work can be done no more than 14 days prior to the procedure.  It can be done at any [facility] lab.  There is one downstairs on the first floor of this building and one in the [facility] building [phone] [address])   *REPS  ERIC  Puncture site LEFT GROIN   If you need a refill on your cardiac medications before your next appointment, please call your pharmacy.

## 2016-01-26 NOTE — Assessment & Plan Note (Signed)
Mr. Barno returns today for follow-up of his recent endovascular procedure which I performed on 01/03/16. He has a history of left SFA stenting which I performed 10/30/02. He's had an interposition graft placed by Dr. Kellie Simmering of his left SFA because of a "space-occupying lesion. Because of progressive claudication on the left eye angiogram to H/17/17 and stented his left common femoral artery with a 9 mm x 40 mm stent. I did demonstrate an occluded left SFA as well as a 90% calcified mid right SFA stenosis with three-vessel runoff bilaterally. I came back on 01/03/16 and stented his entire left SFA with a 6 mm x 250 mm long Viabahn covered stent. His claudication has completely resolved. His ABI on the left side has normalized. He now has lifestyle limiting claudication on the right and wishes that revascularized.

## 2016-02-08 LAB — CBC WITH DIFFERENTIAL/PLATELET
Basophils Absolute: 0 cells/uL (ref 0–200)
Basophils Relative: 0 %
EOS PCT: 2 %
Eosinophils Absolute: 146 cells/uL (ref 15–500)
HCT: 43.3 % (ref 38.5–50.0)
HEMOGLOBIN: 14 g/dL (ref 13.2–17.1)
LYMPHS ABS: 2993 {cells}/uL (ref 850–3900)
Lymphocytes Relative: 41 %
MCH: 28.2 pg (ref 27.0–33.0)
MCHC: 32.3 g/dL (ref 32.0–36.0)
MCV: 87.3 fL (ref 80.0–100.0)
MONO ABS: 584 {cells}/uL (ref 200–950)
MPV: 11.7 fL (ref 7.5–12.5)
Monocytes Relative: 8 %
NEUTROS ABS: 3577 {cells}/uL (ref 1500–7800)
Neutrophils Relative %: 49 %
Platelets: 164 10*3/uL (ref 140–400)
RBC: 4.96 MIL/uL (ref 4.20–5.80)
RDW: 14.6 % (ref 11.0–15.0)
WBC: 7.3 10*3/uL (ref 3.8–10.8)

## 2016-02-09 LAB — PROTIME-INR
INR: 1
Prothrombin Time: 11.1 s (ref 9.0–11.5)

## 2016-02-09 LAB — BASIC METABOLIC PANEL
BUN: 20 mg/dL (ref 7–25)
CHLORIDE: 103 mmol/L (ref 98–110)
CO2: 28 mmol/L (ref 20–31)
Calcium: 9.7 mg/dL (ref 8.6–10.3)
Creat: 0.82 mg/dL (ref 0.70–1.18)
GLUCOSE: 184 mg/dL — AB (ref 65–99)
POTASSIUM: 4 mmol/L (ref 3.5–5.3)
SODIUM: 139 mmol/L (ref 135–146)

## 2016-02-09 LAB — APTT: APTT: 27 s (ref 22–34)

## 2016-02-14 ENCOUNTER — Encounter (HOSPITAL_COMMUNITY)
Admission: RE | Disposition: A | Discharge status: Home / Self Care | Payer: Self-pay | Source: Ambulatory Visit | Attending: Cardiovascular Disease

## 2016-02-14 ENCOUNTER — Encounter (HOSPITAL_COMMUNITY): Payer: Self-pay | Admitting: Cardiovascular Disease

## 2016-02-14 ENCOUNTER — Ambulatory Visit (HOSPITAL_BASED_OUTPATIENT_CLINIC_OR_DEPARTMENT_OTHER)
Admission: RE | Admit: 2016-02-14 | Discharge: 2016-02-15 | Disposition: A | Discharge status: Home / Self Care | Payer: Medicare HMO | Source: Ambulatory Visit | Attending: Cardiovascular Disease | Admitting: Cardiovascular Disease

## 2016-02-14 DIAGNOSIS — N400 Enlarged prostate without lower urinary tract symptoms: Secondary | ICD-10-CM | POA: Diagnosis not present

## 2016-02-14 DIAGNOSIS — E119 Type 2 diabetes mellitus without complications: Secondary | ICD-10-CM | POA: Diagnosis not present

## 2016-02-14 DIAGNOSIS — Z85820 Personal history of malignant melanoma of skin: Secondary | ICD-10-CM | POA: Diagnosis not present

## 2016-02-14 DIAGNOSIS — I25810 Atherosclerosis of coronary artery bypass graft(s) without angina pectoris: Secondary | ICD-10-CM | POA: Diagnosis not present

## 2016-02-14 DIAGNOSIS — Z7982 Long term (current) use of aspirin: Secondary | ICD-10-CM | POA: Diagnosis not present

## 2016-02-14 DIAGNOSIS — Z79899 Other long term (current) drug therapy: Secondary | ICD-10-CM | POA: Diagnosis not present

## 2016-02-14 DIAGNOSIS — I959 Hypotension, unspecified: Secondary | ICD-10-CM | POA: Diagnosis not present

## 2016-02-14 DIAGNOSIS — Z95820 Peripheral vascular angioplasty status with implants and grafts: Secondary | ICD-10-CM | POA: Diagnosis not present

## 2016-02-14 DIAGNOSIS — R112 Nausea with vomiting, unspecified: Secondary | ICD-10-CM | POA: Diagnosis not present

## 2016-02-14 DIAGNOSIS — I739 Peripheral vascular disease, unspecified: Secondary | ICD-10-CM | POA: Diagnosis present

## 2016-02-14 DIAGNOSIS — I97638 Postprocedural hematoma of a circulatory system organ or structure following other circulatory system procedure: Secondary | ICD-10-CM | POA: Diagnosis not present

## 2016-02-14 DIAGNOSIS — Z950 Presence of cardiac pacemaker: Secondary | ICD-10-CM | POA: Diagnosis not present

## 2016-02-14 DIAGNOSIS — D72829 Elevated white blood cell count, unspecified: Secondary | ICD-10-CM | POA: Diagnosis not present

## 2016-02-14 DIAGNOSIS — Z7902 Long term (current) use of antithrombotics/antiplatelets: Secondary | ICD-10-CM | POA: Diagnosis not present

## 2016-02-14 DIAGNOSIS — Z7984 Long term (current) use of oral hypoglycemic drugs: Secondary | ICD-10-CM | POA: Diagnosis not present

## 2016-02-14 DIAGNOSIS — G4733 Obstructive sleep apnea (adult) (pediatric): Secondary | ICD-10-CM | POA: Diagnosis not present

## 2016-02-14 DIAGNOSIS — Z87891 Personal history of nicotine dependence: Secondary | ICD-10-CM | POA: Diagnosis not present

## 2016-02-14 DIAGNOSIS — Z951 Presence of aortocoronary bypass graft: Secondary | ICD-10-CM | POA: Diagnosis not present

## 2016-02-14 DIAGNOSIS — Z85828 Personal history of other malignant neoplasm of skin: Secondary | ICD-10-CM | POA: Diagnosis not present

## 2016-02-14 DIAGNOSIS — R197 Diarrhea, unspecified: Secondary | ICD-10-CM | POA: Diagnosis not present

## 2016-02-14 DIAGNOSIS — I10 Essential (primary) hypertension: Secondary | ICD-10-CM | POA: Diagnosis not present

## 2016-02-14 DIAGNOSIS — E785 Hyperlipidemia, unspecified: Secondary | ICD-10-CM | POA: Diagnosis not present

## 2016-02-14 DIAGNOSIS — I70211 Atherosclerosis of native arteries of extremities with intermittent claudication, right leg: Secondary | ICD-10-CM | POA: Diagnosis not present

## 2016-02-14 DIAGNOSIS — Y838 Other surgical procedures as the cause of abnormal reaction of the patient, or of later complication, without mention of misadventure at the time of the procedure: Secondary | ICD-10-CM | POA: Diagnosis not present

## 2016-02-14 HISTORY — PX: PERIPHERAL VASCULAR CATHETERIZATION: SHX172C

## 2016-02-14 LAB — POCT ACTIVATED CLOTTING TIME
ACTIVATED CLOTTING TIME: 241 s
ACTIVATED CLOTTING TIME: 213 s
Activated Clotting Time: 213 seconds
Activated Clotting Time: 164 seconds

## 2016-02-14 LAB — GLUCOSE, CAPILLARY
GLUCOSE-CAPILLARY: 254 mg/dL — AB (ref 65–99)
GLUCOSE-CAPILLARY: 190 mg/dL — AB (ref 65–99)
Glucose-Capillary: 184 mg/dL — ABNORMAL HIGH (ref 65–99)
Glucose-Capillary: 342 mg/dL — ABNORMAL HIGH (ref 65–99)

## 2016-02-14 SURGERY — PERIPHERAL VASCULAR ATHERECTOMY
Laterality: Right

## 2016-02-14 MED ORDER — VITAMIN D 1000 UNITS PO TABS
1000.0000 [IU] | ORAL_TABLET | Freq: Two times a day (BID) | ORAL | Status: DC
  Administered 2016-02-14 – 2016-02-15 (×2): 1000 [IU] via ORAL
  Filled 2016-02-14 (×2): qty 1

## 2016-02-14 MED ORDER — MAGNESIUM HYDROXIDE 400 MG/5ML PO SUSP: 30.0000 mL | ORAL | Status: DC | PRN

## 2016-02-14 MED ORDER — ASPIRIN EC 81 MG PO TBEC
81.0000 mg | DELAYED_RELEASE_TABLET | Freq: Every day | ORAL | Status: DC
  Administered 2016-02-15: 10:00:00 81 mg via ORAL
  Filled 2016-02-14: qty 1

## 2016-02-14 MED ORDER — SODIUM CHLORIDE 0.9% FLUSH: 3.0000 mL | INTRAVENOUS | Status: DC | PRN

## 2016-02-14 MED ORDER — ONDANSETRON HCL 4 MG/2ML IJ SOLN: 4.0000 mg | Freq: Four times a day (QID) | INTRAMUSCULAR | Status: DC | PRN

## 2016-02-14 MED ORDER — HEPARIN SODIUM (PORCINE) 1000 UNIT/ML IJ SOLN
INTRAMUSCULAR | Status: DC | PRN
  Administered 2016-02-14: 3000 [IU] via INTRAVENOUS
  Administered 2016-02-14: 6000 [IU] via INTRAVENOUS

## 2016-02-14 MED ORDER — VITAMIN C 500 MG PO TABS
500.0000 mg | ORAL_TABLET | Freq: Two times a day (BID) | ORAL | Status: DC
  Administered 2016-02-14 – 2016-02-15 (×2): 500 mg via ORAL
  Filled 2016-02-14 (×2): qty 1

## 2016-02-14 MED ORDER — LIDOCAINE HCL (PF) 1 % IJ SOLN
INTRAMUSCULAR | Status: DC | PRN
  Administered 2016-02-14: 26 mL

## 2016-02-14 MED ORDER — CLOPIDOGREL BISULFATE 75 MG PO TABS
75.0000 mg | ORAL_TABLET | Freq: Every day | ORAL | Status: DC
Start: 2016-02-15 — End: 2016-02-15
  Administered 2016-02-15: 10:00:00 75 mg via ORAL
  Filled 2016-02-14: qty 1

## 2016-02-14 MED ORDER — SODIUM CHLORIDE 0.9 % WEIGHT BASED INFUSION: 1.0000 mL/kg/h | INTRAVENOUS | Status: DC

## 2016-02-14 MED ORDER — METOPROLOL TARTRATE 25 MG PO TABS
50.0000 mg | ORAL_TABLET | Freq: Two times a day (BID) | ORAL | Status: DC
  Administered 2016-02-14 – 2016-02-15 (×2): 50 mg via ORAL
  Filled 2016-02-14 (×2): qty 2

## 2016-02-14 MED ORDER — NITROGLYCERIN 0.4 MG SL SUBL: 0.4000 mg | SUBLINGUAL_TABLET | SUBLINGUAL | Status: DC | PRN

## 2016-02-14 MED ORDER — SODIUM CHLORIDE 0.9 % IV SOLN
INTRAVENOUS | Status: AC
  Administered 2016-02-14: 11:00:00 via INTRAVENOUS

## 2016-02-14 MED ORDER — INSULIN ASPART 100 UNIT/ML ~~LOC~~ SOLN
0.0000 [IU] | Freq: Every day | SUBCUTANEOUS | Status: DC
  Administered 2016-02-14: 22:00:00 4 [IU] via SUBCUTANEOUS

## 2016-02-14 MED ORDER — TAMSULOSIN HCL 0.4 MG PO CAPS
0.4000 mg | ORAL_CAPSULE | Freq: Two times a day (BID) | ORAL | Status: DC
Start: 2016-02-14 — End: 2016-02-15
  Administered 2016-02-14 – 2016-02-15 (×2): 0.4 mg via ORAL
  Filled 2016-02-14 (×2): qty 1

## 2016-02-14 MED ORDER — CLOPIDOGREL BISULFATE 75 MG PO TABS
ORAL_TABLET | ORAL | Status: AC
  Filled 2016-02-14: qty 1

## 2016-02-14 MED ORDER — HYDROCHLOROTHIAZIDE 25 MG PO TABS
25.0000 mg | ORAL_TABLET | Freq: Every day | ORAL | Status: DC
  Administered 2016-02-15: 25 mg via ORAL
  Filled 2016-02-14: qty 1

## 2016-02-14 MED ORDER — HEPARIN SODIUM (PORCINE) 1000 UNIT/ML IJ SOLN
INTRAMUSCULAR | Status: AC
  Filled 2016-02-14: qty 1

## 2016-02-14 MED ORDER — CLOPIDOGREL BISULFATE 75 MG PO TABS: 75.0000 mg | ORAL_TABLET | Freq: Every day | ORAL | Status: DC

## 2016-02-14 MED ORDER — MIDAZOLAM HCL 2 MG/2ML IJ SOLN
INTRAMUSCULAR | Status: DC | PRN
  Administered 2016-02-14: 1 mg via INTRAVENOUS

## 2016-02-14 MED ORDER — MIDAZOLAM HCL 2 MG/2ML IJ SOLN
INTRAMUSCULAR | Status: AC
  Filled 2016-02-14: qty 2

## 2016-02-14 MED ORDER — INSULIN ASPART 100 UNIT/ML ~~LOC~~ SOLN: 0.0000 [IU] | Freq: Three times a day (TID) | SUBCUTANEOUS | Status: DC

## 2016-02-14 MED ORDER — ISOSORBIDE MONONITRATE ER 30 MG PO TB24
30.0000 mg | ORAL_TABLET | Freq: Every day | ORAL | Status: DC
  Administered 2016-02-15: 30 mg via ORAL
  Filled 2016-02-14 (×2): qty 1

## 2016-02-14 MED ORDER — FENTANYL CITRATE (PF) 100 MCG/2ML IJ SOLN
INTRAMUSCULAR | Status: AC
  Filled 2016-02-14: qty 2

## 2016-02-14 MED ORDER — ASPIRIN 81 MG PO CHEW
81.0000 mg | CHEWABLE_TABLET | ORAL | Status: AC
  Administered 2016-02-14: 81 mg via ORAL

## 2016-02-14 MED ORDER — ASPIRIN 81 MG PO CHEW
CHEWABLE_TABLET | ORAL | Status: AC
  Administered 2016-02-14: 81 mg via ORAL
  Filled 2016-02-14: qty 1

## 2016-02-14 MED ORDER — NITROGLYCERIN 1 MG/10 ML FOR IR/CATH LAB
INTRA_ARTERIAL | Status: DC | PRN
  Administered 2016-02-14 (×2): 200 ug via INTRA_ARTERIAL

## 2016-02-14 MED ORDER — ADULT MULTIVITAMIN W/MINERALS CH
1.0000 | ORAL_TABLET | Freq: Every day | ORAL | Status: DC
  Administered 2016-02-15: 10:00:00 1 via ORAL
  Filled 2016-02-14: qty 1

## 2016-02-14 MED ORDER — GLIPIZIDE 5 MG PO TABS
5.0000 mg | ORAL_TABLET | Freq: Every day | ORAL | Status: DC
  Administered 2016-02-15: 10:00:00 5 mg via ORAL
  Filled 2016-02-14: qty 1

## 2016-02-14 MED ORDER — MORPHINE SULFATE (PF) 2 MG/ML IV SOLN: 2.0000 mg | INTRAVENOUS | Status: DC | PRN

## 2016-02-14 MED ORDER — LIDOCAINE HCL (PF) 1 % IJ SOLN
INTRAMUSCULAR | Status: AC
  Filled 2016-02-14: qty 30

## 2016-02-14 MED ORDER — HEPARIN (PORCINE) IN NACL 2-0.9 UNIT/ML-% IJ SOLN
INTRAMUSCULAR | Status: AC
  Filled 2016-02-14: qty 1000

## 2016-02-14 MED ORDER — NITROGLYCERIN IN D5W 200-5 MCG/ML-% IV SOLN
INTRAVENOUS | Status: AC
  Filled 2016-02-14: qty 250

## 2016-02-14 MED ORDER — GABAPENTIN 300 MG PO CAPS
300.0000 mg | ORAL_CAPSULE | Freq: Every day | ORAL | Status: DC
  Administered 2016-02-14: 300 mg via ORAL
  Filled 2016-02-14: qty 1

## 2016-02-14 MED ORDER — CLONIDINE HCL 0.1 MG PO TABS
0.1000 mg | ORAL_TABLET | Freq: Two times a day (BID) | ORAL | Status: DC
  Administered 2016-02-14 – 2016-02-15 (×2): 0.1 mg via ORAL
  Filled 2016-02-14 (×2): qty 1

## 2016-02-14 MED ORDER — IODIXANOL 320 MG/ML IV SOLN
INTRAVENOUS | Status: DC | PRN
  Administered 2016-02-14: 120 mL via INTRAVENOUS

## 2016-02-14 MED ORDER — FENTANYL CITRATE (PF) 100 MCG/2ML IJ SOLN
INTRAMUSCULAR | Status: DC | PRN
  Administered 2016-02-14: 25 ug via INTRAVENOUS

## 2016-02-14 MED ORDER — HEPARIN (PORCINE) IN NACL 2-0.9 UNIT/ML-% IJ SOLN
INTRAMUSCULAR | Status: DC | PRN
  Administered 2016-02-14: 1000 mL

## 2016-02-14 MED ORDER — ANGIOPLASTY BOOK
Freq: Once | Status: AC
  Administered 2016-02-14: 20:00:00
  Filled 2016-02-14: qty 1

## 2016-02-14 MED ORDER — ASPIRIN EC 81 MG PO TBEC: 81.0000 mg | DELAYED_RELEASE_TABLET | Freq: Every day | ORAL | Status: DC

## 2016-02-14 MED ORDER — SODIUM CHLORIDE 0.9 % WEIGHT BASED INFUSION
3.0000 mL/kg/h | INTRAVENOUS | Status: DC
  Administered 2016-02-14: 3 mL/kg/h via INTRAVENOUS

## 2016-02-14 MED ORDER — CLOPIDOGREL BISULFATE 75 MG PO TABS
75.0000 mg | ORAL_TABLET | Freq: Once | ORAL | Status: AC
  Administered 2016-02-14: 75 mg via ORAL

## 2016-02-14 MED ORDER — HYDRALAZINE HCL 20 MG/ML IJ SOLN
10.0000 mg | INTRAMUSCULAR | Status: DC | PRN
  Administered 2016-02-14: 10 mg via INTRAVENOUS
  Filled 2016-02-14: qty 1

## 2016-02-14 MED ORDER — METFORMIN HCL 500 MG PO TABS: 1000.0000 mg | ORAL_TABLET | Freq: Two times a day (BID) | ORAL | Status: DC

## 2016-02-14 MED ORDER — LOSARTAN POTASSIUM 50 MG PO TABS
100.0000 mg | ORAL_TABLET | Freq: Every day | ORAL | Status: DC
  Administered 2016-02-15: 100 mg via ORAL
  Filled 2016-02-14: qty 2

## 2016-02-14 MED ORDER — ACETAMINOPHEN 325 MG PO TABS: 650.0000 mg | ORAL_TABLET | ORAL | Status: DC | PRN

## 2016-02-14 MED ORDER — LOSARTAN POTASSIUM-HCTZ 100-25 MG PO TABS: 1.0000 | ORAL_TABLET | Freq: Every day | ORAL | Status: DC

## 2016-02-14 MED ORDER — ACETAMINOPHEN 500 MG PO TABS: 500.0000 mg | ORAL_TABLET | Freq: Four times a day (QID) | ORAL | Status: DC | PRN

## 2016-02-14 MED ORDER — HYDRALAZINE HCL 20 MG/ML IJ SOLN
10.0000 mg | Freq: Once | INTRAMUSCULAR | Status: DC
  Administered 2016-02-14: 10 mg via INTRAVENOUS

## 2016-02-14 MED ORDER — AMLODIPINE BESYLATE 10 MG PO TABS
10.0000 mg | ORAL_TABLET | Freq: Every day | ORAL | Status: DC
  Administered 2016-02-15: 10 mg via ORAL
  Filled 2016-02-14: qty 1

## 2016-02-14 MED ORDER — VERAPAMIL HCL 2.5 MG/ML IV SOLN
INTRAVENOUS | Status: AC
  Filled 2016-02-14: qty 2

## 2016-02-14 SURGICAL SUPPLY — 24 items
BALLN ARMADA 4.0X120X150 (BALLOONS) ×3
BALLN IN.PACT DCB 5X150 (BALLOONS) ×3
BALLOON ARMADA 4.0X120X150 (BALLOONS) ×2 IMPLANT
CATH CXI SUPP ST 2.6FR 150CM (CATHETERS) ×3 IMPLANT
CATH INFINITI 5 FR IM (CATHETERS) ×3 IMPLANT
DCB IN.PACT 5X150 (BALLOONS) ×2 IMPLANT
DEVICE CONTINUOUS FLUSH (MISCELLANEOUS) ×3 IMPLANT
DEVICE EMBOSHIELD NAV6 4.0-7.0 (WIRE) ×6 IMPLANT
DIAMONDBACK SOLID OAS 1.5MM (CATHETERS) ×3
KIT ENCORE 26 ADVANTAGE (KITS) ×3 IMPLANT
KIT PV (KITS) ×3 IMPLANT
LUBRICANT VIPERSLIDE CORONARY (MISCELLANEOUS) ×3 IMPLANT
SHEATH PINNACLE 5F 10CM (SHEATH) ×3 IMPLANT
SHEATH PINNACLE 7F 10CM (SHEATH) ×3 IMPLANT
SHEATH PINNACLE MP 7F 45CM (SHEATH) ×3 IMPLANT
SYRINGE MEDRAD AVANTA MACH 7 (SYRINGE) ×3 IMPLANT
SYSTEM DIMNDBCK SLD OAS 1.5MM (CATHETERS) ×2 IMPLANT
TAPE RADIOPAQUE TURBO (MISCELLANEOUS) ×3 IMPLANT
TRANSDUCER W/STOPCOCK (MISCELLANEOUS) ×3 IMPLANT
TRAY PV CATH (CUSTOM PROCEDURE TRAY) ×3 IMPLANT
TUBING CIL FLEX 10 FLL-RA (TUBING) ×3 IMPLANT
WIRE HITORQ VERSACORE ST 145CM (WIRE) ×3 IMPLANT
WIRE SPARTACORE .014X300CM (WIRE) ×3 IMPLANT
WIRE VIPER ADVANCE .017X335CM (WIRE) ×3 IMPLANT

## 2016-02-14 NOTE — Progress Notes (Signed)
Site area: left groin  Site Prior to Removal:  Level 2  Pressure Applied For 20 MINUTES    Minutes Beginning at 1140  Manual:   Yes.    Patient Status During Pull:  stable  Post Pull Groin Site:  Level 2  Post Pull Instructions Given:  Yes.    Post Pull Pulses Present:  Yes.    Dressing Applied:  Yes.    Comments:  Hematoma 9 cm x 7 cm previously held manually X 2; stable.

## 2016-02-14 NOTE — H&P (View-Only) (Signed)
01/26/2016 Robert Burns   January 02, 1941  [id number]  Primary Physician Thurman Coyer, MD Primary Cardiologist: Lorretta Harp MD Robert Burns  HPI:  The patient is a very pleasant 75 year old, married Caucasian male, father of 55, grandfather to 3 grandchildren whose wife Robert Burns who is also a patient of mine. I last saw him in the office 12/08/15.Robert Burns He has a history of CAD status post coronary artery bypass grafting March 2004 with a LIMA to his LAD, a vein to a diagonal branch, a sequential vein to a ramus and OM branch, as well as a vein to the PDA. Last functional study performed July 28, 2011, was entirely normal. 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 He had a pacemaker placed for sick sinus syndrome November 2008 followed by Dr. Sallyanne Kuster. He has obstructive sleep apnea on CPAP. He complain of left thigh pain and I angiogram'd him revealing patent arteries though he did have a space-occupying lesion which was removed by Dr. Kellie Simmering with placement of an interposition 6-mm Gore-Tex graft. The pathology was uncertain. 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 Dr. Baxter Flattery follows his lipid profile.Since I saw him back 11/07/12 he has done well. Followup Dopplers performed in our office 09/27/12 revealed a high-grade lesion in the distal right SFA with an ABI of 0.3. His left ABI 1.1 without obstructive disease. His most recent lower extremity Doppler Dopplers performed 9/32/16 revealed a right ABI 0.82 And a left ABI of 0.89. Over the last 3 months he's noticed anginal chest pain occurring both at rest and with exertion with left upper extremity radiation. He also complains of left lower extremity discomfort. to moderate anterolateral ischemia. Because of ongoing chest pain and a Myoview that showed anterolateral ischemia and he underwent cardiac catheterization on 02/01/15 revealing high grade segmental proximal right SVG disease and subtotally  occluded diagonal branch SVG. He underwent stenting of his RCA SVG successfully. He does have continued chest pain although somewhat improved since his last procedure. I brought him back for staged diagonal branch SVG intervention on 03/01/15. This was successful and since that time he denies chest pain or shortness of breath. He underwent a generator change by Dr. Sallyanne Kuster in January for end-of-life of his Medtronic pacemaker.Since I saw him 6 months ago he developed new left calf claudication of the last 3 months. Lower extremity arterial Doppler studies performed today revealed a decline in his left ABI from 0.87 down to 0.59 with an occluded left SFA that is a new finding. 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 He had a short segment occlusion of the proximal left SFA with patent mid left SFA stent. He had 90% segmental calcified mid right SFA stenosis with three-vessel runoff bilaterally. I ended up stenting his left external iliac artery with a 9 mm x 40 mm long nitinol self-expanding  stent which improved his symptoms somewhat although he continued to have lifestyle limiting claudication. He underwent staged intervention of his left SFA on 01/03/16. I stented the entire quadrant totally occluded segment with a 6 mm x 250 mm long Viabahn  Stent. His claudication on the left  has resolved and his ABIs normalized. He now complains of right leg medication. I did demonstrate a 90% calcified segmental mid right SFA stenosis at the time of angiography 11/25/15. He will be  diamondback orbital rotation arthrectomy, PT A with drug-eluting balloon.  Current Outpatient Prescriptions  Medication Sig Dispense Refill  . acetaminophen (TYLENOL) 500 MG tablet Take 500 mg by mouth every 6 (six) hours as needed for moderate pain or headache.     Robert Burns amLODipine (NORVASC)  10 MG tablet Take 10 mg by mouth daily.      Robert Burns aspirin EC 81 MG tablet Take 81 mg by mouth daily.      . B Complex Vitamins (B COMPLEX-B12 PO) Take 1 tablet by mouth daily.     . cholecalciferol (VITAMIN D) 1000 units tablet Take 1,000 Units by mouth 2 (two) times daily.     . cloNIDine (CATAPRES) 0.1 MG tablet Take 0.1 mg by mouth 2 (two) times daily.    . clopidogrel (PLAVIX) 75 MG tablet Take 1 tablet (75 mg total) by mouth daily with breakfast. 90 tablet 3  . Doxylamine Succinate, Sleep, (SLEEP AID PO) Take 1 tablet by mouth as needed (for sleep).     . gabapentin (NEURONTIN) 100 MG capsule Take 300 mg by mouth at bedtime.     Robert Burns glipiZIDE (GLUCOTROL) 5 MG tablet Take 5 mg by mouth daily.      . isosorbide mononitrate (IMDUR) 30 MG 24 hr tablet Take 1 tablet (30 mg total) by mouth daily. 30 tablet 11  . losartan-hydrochlorothiazide (HYZAAR) 100-25 MG per tablet Take 1 tablet by mouth daily.      Robert Burns lovastatin (MEVACOR) 40 MG tablet Take 40 mg by mouth at bedtime.      . magnesium hydroxide (MILK OF MAGNESIA) 400 MG/5ML suspension Take 30 mLs by mouth as needed for moderate constipation or indigestion.     . metFORMIN (GLUCOPHAGE) 1000 MG tablet Take 1,000 mg by mouth 2 (two) times daily with a meal.      . metoprolol tartrate (LOPRESSOR) 50 MG tablet Take 1 tablet (50 mg total) by mouth 2 (two) times daily. 60 tablet 12  . Multiple Vitamin (MULTIVITAMIN) capsule Take 1 capsule by mouth daily.      . nitroGLYCERIN (NITROSTAT) 0.4 MG SL tablet Place 1 tablet (0.4 mg total) under the tongue every 5 (five) minutes as needed for chest pain. 25 tablet 3  . Omega-3 Fatty Acids (FISH OIL PO) Take 1 tablet by mouth daily.    . tamsulosin (FLOMAX) 0.4 MG CAPS capsule Take 0.4 mg by mouth 2 (two) times daily.     . vitamin C (ASCORBIC ACID) 500 MG tablet Take 500 mg by mouth 2 (two) times daily.       No current facility-administered medications for this visit.     Allergies  Allergen Reactions  .  Peanut-Containing Drug Products Anaphylaxis and Other (See Comments)    Tongue swelling is severe  . Ace Inhibitors Other (See Comments)    Cough  . Clonidine Hcl Other (See Comments)    Patch only - skin irritation  . Coreg [Carvedilol] Itching  . Diltiazem Hcl Other (See Comments)    Unknown  . Mobic [Meloxicam] Other (See Comments)    GI upset  . Ramipril Other (See Comments)    Unknown    Social History   Social History  . Marital status: Married    Spouse name: N/A  . Number of children: N/A  . Years of education: N/A   Occupational History  . Not on file.   Social History Main Topics  . Smoking status: Former Smoker    Years: 10.00  Types: Pipe  . Smokeless tobacco: Never Used     Comment: "quit smoking in 1973"  . Alcohol use No  . Drug use: No  . Sexual activity: Not Currently   Other Topics Concern  . Not on file   Social History Narrative  . No narrative on file     Review of Systems: General: negative for chills, fever, night sweats or weight changes.  Cardiovascular: negative for chest pain, dyspnea on exertion, edema, orthopnea, palpitations, paroxysmal nocturnal dyspnea or shortness of breath Dermatological: negative for rash Respiratory: negative for cough or wheezing Urologic: negative for hematuria Abdominal: negative for nausea, vomiting, diarrhea, bright red blood per rectum, melena, or hematemesis Neurologic: negative for visual changes, syncope, or dizziness All other systems reviewed and are otherwise negative except as noted above.    Blood pressure 132/70, pulse 88, height 5\' 8"  (1.727 m), weight 177 lb 9.6 oz (80.6 kg).  General appearance: alert and no distress Neck: no adenopathy, no carotid bruit, no JVD, supple, symmetrical, trachea midline and thyroid not enlarged, symmetric, no tenderness/mass/nodules Lungs: clear to auscultation bilaterally Heart: regular rate and rhythm, S1, S2 normal, no murmur, click, rub or  gallop Extremities: extremities normal, atraumatic, no cyanosis or edema and 2+ left pedal pulse.  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 Oblander returns today for follow-up of his recent endovascular procedure which I performed on 01/03/16. He has a history of left SFA stenting which I performed 10/30/02. He's had an interposition graft placed by Dr. Kellie Simmering of his left SFA because of a "space-occupying lesion. Because of progressive claudication on the left eye angiogram to H/17/17 and stented his left common femoral artery with a 9 mm x 40 mm stent. I did demonstrate an occluded left SFA as well as a 90% calcified mid right SFA stenosis with three-vessel runoff bilaterally. I came back on 01/03/16 and stented his entire left SFA with a 6 mm x 250 mm long Viabahn covered stent. His claudication has completely resolved. His ABI on the left side has normalized. He now has lifestyle limiting claudication on the right and wishes that revascularized.      Lorretta Harp MD FACP,FACC,FAHA, [facility] 01/26/2016 11:13 AM

## 2016-02-14 NOTE — Interval H&P Note (Signed)
History and Physical Interval Note:  02/14/2016 8:20 AM  Robert Burns  has presented today for surgery, with the diagnosis of claudication  The various methods of treatment have been discussed with the patient and family. After consideration of risks, benefits and other options for treatment, the patient has consented to  Procedure(s): Lower Extremity Angiography (N/A) as a surgical intervention .  The patient's history has been reviewed, patient examined, no change in status, stable for surgery.  I have reviewed the patient's chart and labs.  Questions were answered to the patient's satisfaction.     Quay Burow

## 2016-02-14 NOTE — Care Management Note (Addendum)
Case Management Note  Patient Details  Name: Robert Burns MRN: [id number] Date of Birth: 1940-09-11  Subjective/Objective:   S/p pv intervention, will be on plavix and asa, lives with wife at home, pta indep, he states [name] will be his new pcp with [facility], he has medication coverage and transport at dc.   NCM will cont to follow for dc needs.                Action/Plan:   Expected Discharge Date:                  Expected Discharge Plan:  [place]  In-House Referral:     Discharge planning Services  CM Consult  Post Acute Care Choice:    Choice offered to:     DME Arranged:    DME Agency:     HH Arranged:    [place] Agency:     Status of Service:  In process, will continue to follow  If discussed at Long Length of Stay Meetings, dates discussed:    Additional Comments:  Zenon Mayo, RN 02/14/2016, 11:17 AM

## 2016-02-15 ENCOUNTER — Emergency Department (HOSPITAL_COMMUNITY): Payer: Medicare HMO

## 2016-02-15 ENCOUNTER — Encounter (HOSPITAL_COMMUNITY): Payer: Self-pay | Admitting: *Deleted

## 2016-02-15 ENCOUNTER — Other Ambulatory Visit: Payer: Self-pay | Admitting: Cardiology

## 2016-02-15 ENCOUNTER — Observation Stay (HOSPITAL_COMMUNITY)
Admission: EM | Admit: 2016-02-15 | Discharge: 2016-02-17 | Disposition: A | Discharge status: Home / Self Care | Payer: Medicare HMO | Attending: Internal Medicine | Admitting: Internal Medicine

## 2016-02-15 DIAGNOSIS — G4733 Obstructive sleep apnea (adult) (pediatric): Secondary | ICD-10-CM | POA: Insufficient documentation

## 2016-02-15 DIAGNOSIS — E118 Type 2 diabetes mellitus with unspecified complications: Secondary | ICD-10-CM

## 2016-02-15 DIAGNOSIS — E785 Hyperlipidemia, unspecified: Secondary | ICD-10-CM | POA: Insufficient documentation

## 2016-02-15 DIAGNOSIS — E1169 Type 2 diabetes mellitus with other specified complication: Secondary | ICD-10-CM | POA: Diagnosis present

## 2016-02-15 DIAGNOSIS — Y838 Other surgical procedures as the cause of abnormal reaction of the patient, or of later complication, without mention of misadventure at the time of the procedure: Secondary | ICD-10-CM | POA: Insufficient documentation

## 2016-02-15 DIAGNOSIS — I97638 Postprocedural hematoma of a circulatory system organ or structure following other circulatory system procedure: Secondary | ICD-10-CM | POA: Insufficient documentation

## 2016-02-15 DIAGNOSIS — I10 Essential (primary) hypertension: Secondary | ICD-10-CM | POA: Insufficient documentation

## 2016-02-15 DIAGNOSIS — I70211 Atherosclerosis of native arteries of extremities with intermittent claudication, right leg: Principal | ICD-10-CM | POA: Insufficient documentation

## 2016-02-15 DIAGNOSIS — Z7982 Long term (current) use of aspirin: Secondary | ICD-10-CM | POA: Insufficient documentation

## 2016-02-15 DIAGNOSIS — I739 Peripheral vascular disease, unspecified: Secondary | ICD-10-CM | POA: Diagnosis not present

## 2016-02-15 DIAGNOSIS — Z7984 Long term (current) use of oral hypoglycemic drugs: Secondary | ICD-10-CM | POA: Insufficient documentation

## 2016-02-15 DIAGNOSIS — Z7902 Long term (current) use of antithrombotics/antiplatelets: Secondary | ICD-10-CM | POA: Insufficient documentation

## 2016-02-15 DIAGNOSIS — Z95820 Peripheral vascular angioplasty status with implants and grafts: Secondary | ICD-10-CM | POA: Insufficient documentation

## 2016-02-15 DIAGNOSIS — I959 Hypotension, unspecified: Secondary | ICD-10-CM | POA: Insufficient documentation

## 2016-02-15 DIAGNOSIS — Z87891 Personal history of nicotine dependence: Secondary | ICD-10-CM | POA: Insufficient documentation

## 2016-02-15 DIAGNOSIS — Z85820 Personal history of malignant melanoma of skin: Secondary | ICD-10-CM | POA: Insufficient documentation

## 2016-02-15 DIAGNOSIS — Z951 Presence of aortocoronary bypass graft: Secondary | ICD-10-CM | POA: Insufficient documentation

## 2016-02-15 DIAGNOSIS — S7012XA Contusion of left thigh, initial encounter: Secondary | ICD-10-CM

## 2016-02-15 DIAGNOSIS — I25810 Atherosclerosis of coronary artery bypass graft(s) without angina pectoris: Secondary | ICD-10-CM | POA: Insufficient documentation

## 2016-02-15 DIAGNOSIS — Z794 Long term (current) use of insulin: Secondary | ICD-10-CM

## 2016-02-15 DIAGNOSIS — Z950 Presence of cardiac pacemaker: Secondary | ICD-10-CM | POA: Insufficient documentation

## 2016-02-15 DIAGNOSIS — D72829 Elevated white blood cell count, unspecified: Secondary | ICD-10-CM | POA: Diagnosis present

## 2016-02-15 DIAGNOSIS — Z79899 Other long term (current) drug therapy: Secondary | ICD-10-CM | POA: Insufficient documentation

## 2016-02-15 DIAGNOSIS — R197 Diarrhea, unspecified: Secondary | ICD-10-CM | POA: Diagnosis present

## 2016-02-15 DIAGNOSIS — E119 Type 2 diabetes mellitus without complications: Secondary | ICD-10-CM | POA: Insufficient documentation

## 2016-02-15 DIAGNOSIS — N400 Enlarged prostate without lower urinary tract symptoms: Secondary | ICD-10-CM | POA: Insufficient documentation

## 2016-02-15 DIAGNOSIS — Z9989 Dependence on other enabling machines and devices: Secondary | ICD-10-CM

## 2016-02-15 DIAGNOSIS — Z85828 Personal history of other malignant neoplasm of skin: Secondary | ICD-10-CM | POA: Insufficient documentation

## 2016-02-15 DIAGNOSIS — R112 Nausea with vomiting, unspecified: Secondary | ICD-10-CM | POA: Insufficient documentation

## 2016-02-15 LAB — CBC WITH DIFFERENTIAL/PLATELET
Basophils Absolute: 0 10*3/uL (ref 0.0–0.1)
Basophils Relative: 0 %
Eosinophils Absolute: 0.1 10*3/uL (ref 0.0–0.7)
Eosinophils Relative: 1 %
HEMATOCRIT: 37.5 % — AB (ref 39.0–52.0)
Hemoglobin: 12.3 g/dL — ABNORMAL LOW (ref 13.0–17.0)
LYMPHS PCT: 25 %
Lymphs Abs: 3.1 10*3/uL (ref 0.7–4.0)
MCH: 28.4 pg (ref 26.0–34.0)
MCHC: 32.8 g/dL (ref 30.0–36.0)
MCV: 86.6 fL (ref 78.0–100.0)
MONO ABS: 1.1 10*3/uL — AB (ref 0.1–1.0)
MONOS PCT: 9 %
NEUTROS ABS: 8.4 10*3/uL — AB (ref 1.7–7.7)
Neutrophils Relative %: 65 %
Platelets: 157 10*3/uL (ref 150–400)
RBC: 4.33 MIL/uL (ref 4.22–5.81)
RDW: 14.3 % (ref 11.5–15.5)
WBC: 12.6 10*3/uL — ABNORMAL HIGH (ref 4.0–10.5)

## 2016-02-15 LAB — BASIC METABOLIC PANEL
Anion gap: 9 (ref 5–15)
BUN: 16 mg/dL (ref 6–20)
CALCIUM: 9.3 mg/dL (ref 8.9–10.3)
CO2: 25 mmol/L (ref 22–32)
CREATININE: 0.75 mg/dL (ref 0.61–1.24)
Chloride: 109 mmol/L (ref 101–111)
GFR calc non Af Amer: >60 mL/min (ref >60)
Glucose, Bld: 177 mg/dL — ABNORMAL HIGH (ref 65–99)
Potassium: 3.7 mmol/L (ref 3.5–5.1)
Sodium: 143 mmol/L (ref 135–145)

## 2016-02-15 LAB — COMPREHENSIVE METABOLIC PANEL
ALT: 18 U/L (ref 17–63)
ANION GAP: 7 (ref 5–15)
AST: 19 U/L (ref 15–41)
Albumin: 3.8 g/dL (ref 3.5–5.0)
Alkaline Phosphatase: 45 U/L (ref 38–126)
BILIRUBIN TOTAL: 0.6 mg/dL (ref 0.3–1.2)
BUN: 18 mg/dL (ref 6–20)
CO2: 27 mmol/L (ref 22–32)
Calcium: 10.1 mg/dL (ref 8.9–10.3)
Chloride: 109 mmol/L (ref 101–111)
Creatinine, Ser: 1.16 mg/dL (ref 0.61–1.24)
GFR calc Af Amer: >60 mL/min (ref >60)
GFR, EST NON AFRICAN AMERICAN: 59 mL/min — AB (ref >60)
Glucose, Bld: 150 mg/dL — ABNORMAL HIGH (ref 65–99)
POTASSIUM: 4.3 mmol/L (ref 3.5–5.1)
Sodium: 143 mmol/L (ref 135–145)
TOTAL PROTEIN: 6.5 g/dL (ref 6.5–8.1)

## 2016-02-15 LAB — TROPONIN I

## 2016-02-15 LAB — CBC
HCT: 35.9 % — ABNORMAL LOW (ref 39.0–52.0)
Hemoglobin: 11.9 g/dL — ABNORMAL LOW (ref 13.0–17.0)
MCH: 28.4 pg (ref 26.0–34.0)
MCHC: 33.1 g/dL (ref 30.0–36.0)
MCV: 85.7 fL (ref 78.0–100.0)
PLATELETS: 135 10*3/uL — AB (ref 150–400)
RBC: 4.19 MIL/uL — AB (ref 4.22–5.81)
RDW: 14.3 % (ref 11.5–15.5)
WBC: 9.5 10*3/uL (ref 4.0–10.5)

## 2016-02-15 LAB — GLUCOSE, CAPILLARY
GLUCOSE-CAPILLARY: 225 mg/dL — AB (ref 65–99)
GLUCOSE-CAPILLARY: 239 mg/dL — AB (ref 65–99)
Glucose-Capillary: 330 mg/dL — ABNORMAL HIGH (ref 65–99)

## 2016-02-15 MED ORDER — INSULIN ASPART 100 UNIT/ML ~~LOC~~ SOLN
0.0000 [IU] | Freq: Three times a day (TID) | SUBCUTANEOUS | Status: DC
  Administered 2016-02-16: 5 [IU] via SUBCUTANEOUS
  Administered 2016-02-16: 1 [IU] via SUBCUTANEOUS
  Administered 2016-02-16 – 2016-02-17 (×2): 2 [IU] via SUBCUTANEOUS
  Administered 2016-02-17: 5 [IU] via SUBCUTANEOUS

## 2016-02-15 MED ORDER — ACETAMINOPHEN 325 MG PO TABS
650.0000 mg | ORAL_TABLET | Freq: Four times a day (QID) | ORAL | Status: DC | PRN
Start: 2016-02-15 — End: 2016-02-17

## 2016-02-15 MED ORDER — INSULIN ASPART 100 UNIT/ML ~~LOC~~ SOLN
0.0000 [IU] | Freq: Every day | SUBCUTANEOUS | Status: DC
  Administered 2016-02-15: 3 [IU] via SUBCUTANEOUS
  Administered 2016-02-16: 2 [IU] via SUBCUTANEOUS

## 2016-02-15 MED ORDER — ONDANSETRON HCL 4 MG/2ML IJ SOLN: 4.0000 mg | Freq: Four times a day (QID) | INTRAMUSCULAR | Status: DC | PRN

## 2016-02-15 MED ORDER — INSULIN ASPART 100 UNIT/ML ~~LOC~~ SOLN
0.0000 [IU] | Freq: Three times a day (TID) | SUBCUTANEOUS | Status: DC
  Administered 2016-02-15: 07:00:00 5 [IU] via SUBCUTANEOUS
  Administered 2016-02-15: 12:00:00 11 [IU] via SUBCUTANEOUS

## 2016-02-15 MED ORDER — DIPHENHYDRAMINE HCL 25 MG PO CAPS: 25.0000 mg | ORAL_CAPSULE | Freq: Every evening | ORAL | Status: DC | PRN

## 2016-02-15 MED ORDER — ALBUTEROL SULFATE (2.5 MG/3ML) 0.083% IN NEBU: 2.5000 mg | INHALATION_SOLUTION | RESPIRATORY_TRACT | Status: DC | PRN

## 2016-02-15 MED ORDER — IOPAMIDOL (ISOVUE-300) INJECTION 61%
INTRAVENOUS | Status: AC
  Administered 2016-02-15: 100 mL
  Filled 2016-02-15: qty 100

## 2016-02-15 MED ORDER — NITROGLYCERIN 0.4 MG SL SUBL: 0.4000 mg | SUBLINGUAL_TABLET | SUBLINGUAL | Status: DC | PRN

## 2016-02-15 MED ORDER — INSULIN DETEMIR 100 UNIT/ML ~~LOC~~ SOLN
5.0000 [IU] | Freq: Every day | SUBCUTANEOUS | Status: DC
Start: 2016-02-15 — End: 2016-02-17
  Administered 2016-02-15 – 2016-02-16 (×2): 5 [IU] via SUBCUTANEOUS
  Filled 2016-02-15 (×3): qty 0.05

## 2016-02-15 MED ORDER — AMLODIPINE BESYLATE 10 MG PO TABS: 10.0000 mg | ORAL_TABLET | Freq: Every day | ORAL | Status: DC

## 2016-02-15 MED ORDER — METOPROLOL TARTRATE 50 MG PO TABS
50.0000 mg | ORAL_TABLET | Freq: Two times a day (BID) | ORAL | Status: DC
  Administered 2016-02-15 – 2016-02-16 (×2): 50 mg via ORAL
  Filled 2016-02-15 (×2): qty 1

## 2016-02-15 MED ORDER — LOSARTAN POTASSIUM 50 MG PO TABS: 100.0000 mg | ORAL_TABLET | Freq: Every day | ORAL | Status: DC

## 2016-02-15 MED ORDER — CLOPIDOGREL BISULFATE 75 MG PO TABS
75.0000 mg | ORAL_TABLET | Freq: Every day | ORAL | Status: DC
  Administered 2016-02-16: 75 mg via ORAL
  Filled 2016-02-15: qty 1

## 2016-02-15 MED ORDER — ACETAMINOPHEN 650 MG RE SUPP: 650.0000 mg | Freq: Four times a day (QID) | RECTAL | Status: DC | PRN

## 2016-02-15 MED ORDER — PRAVASTATIN SODIUM 40 MG PO TABS
40.0000 mg | ORAL_TABLET | Freq: Every day | ORAL | Status: DC
  Administered 2016-02-16: 40 mg via ORAL
  Filled 2016-02-15: qty 1

## 2016-02-15 MED ORDER — ASPIRIN EC 81 MG PO TBEC
81.0000 mg | DELAYED_RELEASE_TABLET | Freq: Every day | ORAL | Status: DC
  Filled 2016-02-15: qty 1

## 2016-02-15 MED ORDER — TAMSULOSIN HCL 0.4 MG PO CAPS
0.4000 mg | ORAL_CAPSULE | Freq: Two times a day (BID) | ORAL | Status: DC
  Administered 2016-02-15 – 2016-02-17 (×4): 0.4 mg via ORAL
  Filled 2016-02-15 (×5): qty 1

## 2016-02-15 MED ORDER — ONDANSETRON HCL 4 MG PO TABS: 4.0000 mg | ORAL_TABLET | Freq: Four times a day (QID) | ORAL | Status: DC | PRN

## 2016-02-15 MED ORDER — HYDROCHLOROTHIAZIDE 25 MG PO TABS: 25.0000 mg | ORAL_TABLET | Freq: Every day | ORAL | Status: DC

## 2016-02-15 MED ORDER — SODIUM CHLORIDE 0.9 % IV BOLUS (SEPSIS)
1000.0000 mL | Freq: Once | INTRAVENOUS | Status: AC
  Administered 2016-02-15: 1000 mL via INTRAVENOUS

## 2016-02-15 MED ORDER — GABAPENTIN 300 MG PO CAPS
300.0000 mg | ORAL_CAPSULE | Freq: Every day | ORAL | Status: DC
  Administered 2016-02-15 – 2016-02-16 (×2): 300 mg via ORAL
  Filled 2016-02-15 (×2): qty 1

## 2016-02-15 MED ORDER — VITAMIN D 1000 UNITS PO TABS
1000.0000 [IU] | ORAL_TABLET | Freq: Two times a day (BID) | ORAL | Status: DC
  Administered 2016-02-15 – 2016-02-17 (×4): 1000 [IU] via ORAL
  Filled 2016-02-15 (×5): qty 1

## 2016-02-15 MED ORDER — ENOXAPARIN SODIUM 40 MG/0.4ML ~~LOC~~ SOLN: 40.0000 mg | SUBCUTANEOUS | Status: DC

## 2016-02-15 MED ORDER — LOSARTAN POTASSIUM-HCTZ 100-25 MG PO TABS: 1.0000 | ORAL_TABLET | Freq: Every day | ORAL | Status: DC

## 2016-02-15 MED ORDER — ISOSORBIDE MONONITRATE ER 30 MG PO TB24
30.0000 mg | ORAL_TABLET | Freq: Every day | ORAL | Status: DC
  Administered 2016-02-16 – 2016-02-17 (×2): 30 mg via ORAL
  Filled 2016-02-15 (×3): qty 1

## 2016-02-15 NOTE — ED Provider Notes (Signed)
[facility] DEPT Provider Note   CSN: [id number] Arrival date & time: 02/15/16  1450     History   Chief Complaint Chief Complaint  Patient presents with  . Hypotension    HPI Robert Burns is a 75 y.o. male.  HPI Patient presents the emergency department generalized weakness and lightheadedness was found to be hypotensive.  He was discharged from the hospital several hours ago after a heart catheterization through his left femoral artery where he underwent angioplasty.  No chest pain shortness of breath.  No syncope.  He states that when he left the hospital today felt okay.  His family is concerned because he looks pale now.  He is hypotensive.  He is not have any significant complaint.  He does report some bruising around his left inguinal region and down into his left scrotum.  He states that when the sheath was removed yesterday there was some blood that went against the wall and the nurse held pressure for 20 minutes.  He reports no increasing pain in his left groin or left flank   Past Medical History:  Diagnosis Date  . Arthritis    "all over" (11/25/2015)  . CAD (coronary artery disease)    s/p CABG  06/2002; last nuc 07/18/11 normal EF and negative for ischemia; myoview 01/21/15 abnormal, cath 02/01/15 DES to prox SVG to PDA, subtotal occlusion of SVG to Diag to be treated later.  . Cancer ([place])    Right Shoulder, Left Leg- BCC, SCC, AND MELANOMA  . Hyperlipidemia   . Hypertension   . Leg pain   . OSA on CPAP   . PAD (peripheral artery disease) ([facility])    hx stent to lt.SFA, hx epithelioid hemangioendothelioma of lt SFAwth gore tex graft. 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 Presence of permanent cardiac pacemaker   . SSS (sick sinus syndrome) ([place])    Medtronic Adapta, 10/13/05  . Type II diabetes mellitus ([place])   . Urgency of urination     Patient Active Problem List   Diagnosis Date Noted  . Peripheral arterial disease ([place])     . Claudication ([place]) 11/25/2015  . Pacemaker battery depletion 04/23/2015  . 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 CAD S/P SVG-PDA DES 02/01/15, followed by staged SVG-Dx DES 03/01/15 03/15/2015  . Coronary artery disease due to lipid rich plaque 03/01/2015  . Positive cardiac stress test   . Ischemic chest pain ([place])   . Abnormal nuclear stress test   . Aftercare following surgery of the circulatory system, NEC 05/13/2013  . PVD (peripheral vascular disease) ([place]) 05/13/2013  . OSA on CPAP 11/04/2012  . CAD -CABG 2004 LIMA-LAD, VG-Dx, seq VG -RI & OM branch; VG-PDA    . SSS (sick sinus syndrome), medtronic adapta   . Hyperlipidemia   . Pain in lower limb 05/07/2012  . Superficial femoral artery injury 05/09/2011  . 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 SPRAIN&STRAIN OTHER SPECIFIED SITES KNEE&LEG 01/31/2010  . Diabetes mellitus ([place]) 11/26/2006  . Essential hypertension 11/26/2006    Past Surgical History:  Procedure Laterality Date  . CARDIAC CATHETERIZATION N/A 02/01/2015   Procedure: Left Heart Cath and Coronary Angiography;  Surgeon: Lorretta Harp, MD;  Location: [facility];  Service: Cardiovascular;  Laterality: N/A;  . CARDIAC CATHETERIZATION N/A 02/01/2015   Procedure: Coronary Stent Intervention;  Surgeon: Lorretta Harp, MD;  Location: [facility];  Service: Cardiovascular;  Laterality: N/A;  . CARDIAC CATHETERIZATION  06/2002   "just before bypass OR"  . CARDIAC CATHETERIZATION N/A 03/01/2015   Procedure: Coronary Stent Intervention;  Surgeon: Lorretta Harp, MD;  Location: [facility];  Service: Cardiovascular;  Laterality: N/A;  . CORONARY ANGIOPLASTY    . CORONARY ARTERY BYPASS GRAFT  06/2002   x5, LIMA-LAD;VG- Diag; seq VG- ramus & OM branch; VG-PDA  . EP IMPLANTABLE DEVICE N/A 04/23/2015   Procedure: PPM Generator Changeout;  Surgeon: Sanda Klein, MD;  Location: [facility];   Service: Cardiovascular;  Laterality: N/A;  . FEMORAL ARTERY STENT Left ~ 2014   "taken out of my leg; couldn' catorgorize what kind so the put it under all 3"; cataroziepitheloid hemanioendotheliomau  . FOOT FRACTURE SURGERY Left 1973  . FRACTURE SURGERY    . INSERT / REPLACE / REMOVE PACEMAKER  10/13/05   right side, medtronic Adapta  . KNEE HARDWARE REMOVAL Right 1950's   "3-4 months after the insertion"  . KNEE SURGERY Right 1950's   "broke my lower leg; had to put pin in my knee to keep lower leg in place til it healed"  . PERIPHERAL VASCULAR CATHETERIZATION N/A 11/25/2015   Procedure: Lower Extremity Angiography;  Surgeon: Lorretta Harp, MD;  Location: [facility];  Service: Cardiovascular;  Laterality: N/A;  . PERIPHERAL VASCULAR CATHETERIZATION Left 11/25/2015   Procedure: Peripheral Vascular Intervention;  Surgeon: Lorretta Harp, MD;  Location: [facility];  Service: Cardiovascular;  Laterality: Left;  external iliac  . PERIPHERAL VASCULAR CATHETERIZATION N/A 01/03/2016   Procedure: Lower Extremity Angiography;  Surgeon: Lorretta Harp, MD;  Location: [facility];  Service: Cardiovascular;  Laterality: N/A;  . PERIPHERAL VASCULAR CATHETERIZATION Left 01/03/2016   Procedure: Peripheral Vascular Intervention;  Surgeon: Lorretta Harp, MD;  Location: [facility];  Service: Cardiovascular;  Laterality: Left;  SFA  . PERIPHERAL VASCULAR CATHETERIZATION Right 02/14/2016   Procedure: Peripheral Vascular Atherectomy;  Surgeon: Lorretta Harp, MD;  Location: [facility];  Service: Cardiovascular;  Laterality: Right;  SFA  . POPLITEAL ARTERY STENT  01/03/2016   Contralateral access with a 7 French crossover sheath (second order catheter placement)  . TONSILLECTOMY AND ADENOIDECTOMY    . TUMOR EXCISION Right ~ 2005   cancerous tumor removed from shoulder  . TUMOR EXCISION Right ~ 2000   benign tumor removed from under shoulder  . TUMOR EXCISION Left  06/02/2010   resection of Lt SFA wth interposition of Gore-Tex graft       Home Medications    Prior to Admission medications   Medication Sig Start Date End Date Taking? Authorizing Provider  acetaminophen (TYLENOL) 500 MG tablet Take 500 mg by mouth every 6 (six) hours as needed for moderate pain or headache.    Yes Historical Provider, MD  amLODipine (NORVASC) 10 MG tablet Take 10 mg by mouth daily.     Yes Historical Provider, MD  aspirin EC 81 MG tablet Take 81 mg by mouth daily.     Yes Historical Provider, MD  B Complex Vitamins (B COMPLEX-B12 PO) Take 1 tablet by mouth daily.    Yes Historical Provider, MD  cholecalciferol (VITAMIN D) 1000 units tablet Take 1,000 Units by mouth 2 (two) times daily.    Yes Historical Provider, MD  cloNIDine (CATAPRES) 0.1 MG tablet Take 0.1 mg by mouth 2 (two) times daily.   Yes Historical Provider, MD  clopidogrel (PLAVIX) 75  MG tablet Take 1 tablet (75 mg total) by mouth daily with breakfast. 02/02/15  Yes Almyra Deforest, PA  gabapentin (NEURONTIN) 100 MG capsule Take 300 mg by mouth at bedtime.  01/16/13  Yes Historical Provider, MD  glipiZIDE (GLUCOTROL) 5 MG tablet Take 5 mg by mouth daily.     Yes Historical Provider, MD  isosorbide mononitrate (IMDUR) 30 MG 24 hr tablet Take 1 tablet (30 mg total) by mouth daily. 02/02/15  Yes Almyra Deforest, PA  losartan-hydrochlorothiazide (HYZAAR) 100-25 MG per tablet Take 1 tablet by mouth daily.     Yes Historical Provider, MD  lovastatin (MEVACOR) 40 MG tablet Take 40 mg by mouth at bedtime.     Yes Historical Provider, MD  magnesium hydroxide (MILK OF MAGNESIA) 400 MG/5ML suspension Take 30 mLs by mouth as needed for moderate constipation or indigestion.  03/07/14  Yes Historical Provider, MD  metFORMIN (GLUCOPHAGE) 1000 MG tablet Take 1,000 mg by mouth 2 (two) times daily with a meal.     Yes Historical Provider, MD  metoprolol tartrate (LOPRESSOR) 50 MG tablet Take 1 tablet (50 mg total) by mouth 2 (two) times  daily. 01/04/16  Yes Arbutus Leas, NP  Multiple Vitamin (MULTIVITAMIN) capsule Take 1 capsule by mouth daily.     Yes Historical Provider, MD  Omega-3 Fatty Acids (FISH OIL PO) Take 1 tablet by mouth daily.   Yes Historical Provider, MD  tamsulosin (FLOMAX) 0.4 MG CAPS capsule Take 0.4 mg by mouth 2 (two) times daily.  03/09/15  Yes Historical Provider, MD  vitamin C (ASCORBIC ACID) 500 MG tablet Take 500 mg by mouth 2 (two) times daily.     Yes Historical Provider, MD  Doxylamine Succinate, Sleep, (SLEEP AID PO) Take 1 tablet by mouth as needed (for sleep).     Historical Provider, MD  nitroGLYCERIN (NITROSTAT) 0.4 MG SL tablet Place 1 tablet (0.4 mg total) under the tongue every 5 (five) minutes as needed for chest pain. 02/02/15   Almyra Deforest, PA    Family History Family History  Problem Relation Age of Onset  . Coronary artery disease Mother   . Heart attack Mother   . Hypertension Mother   . Heart disease Mother     Open  Heart surgery  . Diabetes Father   . Heart disease Father   . Hyperlipidemia Father   . Heart attack Father   . Hypertension Sister   . Diabetes Sister   . Heart disease Sister     Social History Social History  Substance Use Topics  . Smoking status: Former Smoker    Years: 10.00    Types: Pipe    Quit date: 26  . Smokeless tobacco: Never Used     Comment: "quit smoking in 1973"  . Alcohol use No     Allergies   Peanut-containing drug products; Ace inhibitors; Clonidine hcl; Coreg [carvedilol]; Diltiazem hcl; Mobic [meloxicam]; and Ramipril   Review of Systems Review of Systems  All other systems reviewed and are negative.    Physical Exam Updated Vital Signs BP 136/58   Pulse (!) 59   Temp 97.7 F (36.5 C) (Oral)   Resp 16   SpO2 99%   Physical Exam  Constitutional: He is oriented to person, place, and time. He appears well-developed and well-nourished.  HENT:  Head: Normocephalic and atraumatic.  Eyes: EOM are normal.  Neck:  Normal range of motion.  Cardiovascular: Normal rate, regular rhythm, normal heart sounds and intact distal  pulses.   Pulmonary/Chest: Effort normal and breath sounds normal. No respiratory distress.  Abdominal: Soft. He exhibits no distension. There is no tenderness.  No pulsatile mass in the left inguinal region.  No active bleeding in the left inguinal region from the catheter site.  He does have significant ecchymosis of his left inguinal region as well as his scrotum.  Musculoskeletal: Normal range of motion.  Neurological: He is alert and oriented to person, place, and time.  Skin: Skin is warm and dry.  Psychiatric: He has a normal mood and affect. Judgment normal.  Nursing note and vitals reviewed.    ED Treatments / Results  Labs (all labs ordered are listed, but only abnormal results are displayed) Labs Reviewed  CBC WITH DIFFERENTIAL/PLATELET - Abnormal; Notable for the following:       Result Value   WBC 12.6 (*)    Hemoglobin 12.3 (*)    HCT 37.5 (*)    Neutro Abs 8.4 (*)    Monocytes Absolute 1.1 (*)    All other components within normal limits  COMPREHENSIVE METABOLIC PANEL - Abnormal; Notable for the following:    Glucose, Bld 150 (*)    GFR calc non Af Amer 59 (*)    All other components within normal limits  TROPONIN I    EKG  EKG Interpretation None       Radiology No results found.  Procedures Procedures (including critical care time)  Medications Ordered in ED Medications  sodium chloride 0.9 % bolus 1,000 mL (1,000 mLs Intravenous New Bag/Given 02/15/16 1554)  iopamidol (ISOVUE-300) 61 % injection (100 mLs  Contrast Given 02/15/16 1729)     Initial Impression / Assessment and Plan / ED Course  I have reviewed the triage vital signs and the nursing notes.  Pertinent labs & imaging results that were available during my care of the patient were reviewed by me and considered in my medical decision making (see chart for details).  Clinical  Course     The patient is pale this time.  Question acute blood loss in his left inguinal or left retroperitoneal region.  Patient will undergo CT imaging at this time.  Care to Dr Leonette Monarch to follow up on imaging  Final Clinical Impressions(s) / ED Diagnoses   Final diagnoses:  None    New Prescriptions New Prescriptions   No medications on file     Jola Schmidt, MD 02/15/16 1738

## 2016-02-15 NOTE — Progress Notes (Signed)
Patient Name: Robert Burns Date of Encounter: 02/15/2016  Primary Cardiologist: Dr. Serena Croissant Problem List     Active Problems:   PVD (peripheral vascular disease) ([place])   Claudication ([place])     Subjective   Feels great, denies leg pain. Groin site stable.   Inpatient Medications    Scheduled Meds: . amLODipine  10 mg Oral Daily  . aspirin EC  81 mg Oral Daily  . cholecalciferol  1,000 Units Oral BID  . cloNIDine  0.1 mg Oral BID  . clopidogrel  75 mg Oral Q breakfast  . gabapentin  300 mg Oral QHS  . glipiZIDE  5 mg Oral Daily  . losartan  100 mg Oral Daily   And  . hydrochlorothiazide  25 mg Oral Daily  . insulin aspart  0-15 Units Subcutaneous TID WC  . insulin aspart  0-5 Units Subcutaneous QHS  . isosorbide mononitrate  30 mg Oral Daily  . metFORMIN  1,000 mg Oral BID WC  . metoprolol  50 mg Oral BID  . multivitamin with minerals  1 tablet Oral Daily  . tamsulosin  0.4 mg Oral BID  . vitamin C  500 mg Oral BID   Continuous Infusions:  PRN Meds: acetaminophen, acetaminophen, hydrALAZINE, magnesium hydroxide, morphine injection, nitroGLYCERIN, ondansetron (ZOFRAN) IV   Vital Signs    Vitals:   02/14/16 1400 02/14/16 2000 02/15/16 0544 02/15/16 0731  BP: (!) 147/54 (!) 167/52 (!) 157/64 (!) 179/68  Pulse: 61 72 63 62  Resp: (!) 21 19 16 12   Temp:  98.1 F (36.7 C) 97.4 F (36.3 C) 97 F (36.1 C)  TempSrc:  Oral Oral Axillary  SpO2: 100% 95% 97% 100%  Weight:   174 lb 2.6 oz (79 kg)   Height:        Intake/Output Summary (Last 24 hours) at 02/15/16 0859 Last data filed at 02/15/16 0807  Gross per 24 hour  Intake           1592.5 ml  Output             1900 ml  Net           -307.5 ml   Filed Weights   02/14/16 0609 02/15/16 0544  Weight: 175 lb (79.4 kg) 174 lb 2.6 oz (79 kg)    Physical Exam   GEN: Well nourished, well developed, in no acute distress.  HEENT: Grossly normal.  Neck: Supple, no JVD, carotid bruits, or  masses. Cardiac: RRR, no murmurs, rubs, or gallops. No clubbing, cyanosis, edema.  Radials/DP/PT 2+ and equal bilaterally.  Respiratory:  Respirations regular and unlabored, clear to auscultation bilaterally. GI: Soft, nontender, nondistended, BS + x 4. MS: no deformity or atrophy. Skin: warm and dry, no rash. Neuro:  Strength and sensation are intact. Psych: AAOx3.  Normal affect.  Labs    CBC  Recent Labs  02/15/16 0547  WBC 9.5  HGB 11.9*  HCT 35.9*  MCV 85.7  PLT [id number]*   Basic Metabolic Panel  Recent Labs  02/15/16 0547  NA 143  K 3.7  CL 109  CO2 25  GLUCOSE 177*  BUN 16  CREATININE 0.75  CALCIUM 9.3    Telemetry    AV paced, frequent PVC's - Personally Reviewed  ECG     A paced- Personally Reviewed  Radiology    No results found.  Cardiac Studies  Peripheral Vascular Atherectomy  Final Impression: Successful long segmental mid calcified right SFA stenosis  diamondback orbital rotation arthrectomy, drug eluting balloon angioplasty using distal protection for lifestyle limiting claudication. The patient tolerated the procedure well. He was on aspirin and Plavix which we will continue. The sheath will be removed and pressure held once the ACT falls below 170. He will be gently hydrated overnight and discharged home in the morning. We will get lower extremity arterial Doppler studies in our [facility] line office next week and I will see him back 2 -3 weeks thereafter.   Patient Profile     75 year old male with a past medical history of CAD s/p CABG in 2004, OSA, HLD, and PAD. Presented on 02/14/16 for PV Angiogram. Had successful long segmental mid calcified right SFA stenosis diamondback orbital rotation arthrectomy, drug eluting balloon angioplasty using distal protection for lifestyle limiting claudication.   Assessment & Plan    1. PAD: PV report above. He will continue on ASA and Plavix. He will need lower extremity arterial dopplers in one week and Dr.  Gwenlyn Found will see him back 2-3 weeks thereafter.   His left groin site is stable without hematoma.   2. History of CAD s/p CABG  3. HLD: Not on statin drug, would add high intensity statin.  Signed, Arbutus Leas, NP  02/15/2016, 8:59 AM   Attending Note:   The patient was seen and examined.  Agree with assessment and plan as noted above.  Changes made to the above note as needed.  Patient seen and independently examined with Jettie Booze , NP .   We discussed all aspects of the encounter. I agree with the assessment and plan as stated above.  Pt is stable for DC .  Groin looks ok     I have spent a total of 40 minutes with patient reviewing hospital  notes , telemetry, EKGs, labs and examining patient as well as establishing an assessment and plan that was discussed with the patient. > 50% of time was spent in direct patient care.    Thayer Headings, Brooke Bonito., MD, [facility] 02/15/2016, 9:29 AM [address],  [place] Pager [phone]

## 2016-02-15 NOTE — ED Notes (Signed)
Patient returned from CT

## 2016-02-15 NOTE — H&P (Signed)
History and Physical    Robert Burns [id number] DOB: 10-21-1940 DOA: 02/15/2016  Referring MD/NP/PA: Dr. Dallas Breeding PCP: Irven Shelling, MD  Patient coming from: [place] PCP office  Chief Complaint: Low blood pressure  HPI: Robert Burns is a 75 y.o. male with medical history significant of HTN, HLD, CAD s/p stent/CABG, s/p PM for SSS, PVD, OSA on CPAP; who presents for low blood pressures. He had just been discharged this afternoon around 12 PM from right SFA stenosis with diamondback orbital rotation arthrectomy, and drug eluting balloon angioplasty which was performed yesterday. Patient was noted to have tolerated procedure well, but had some bleeding after removal of the sheath for the procedure which the nurse had to hold pressure for 20 minutes. He stayed overnight and was able to wake up this morning and eat without any significant issues. Patient expressed some bruising of the left inguinal region where the catheter was placed. After he was discharged, he got in the car with his family and was driven across the street to his wife doctor's appointment. Upon him getting in the doctor's office he reports feeling dizzy and shaky. Associated symptoms included paleness in color, nausea and vomiting 1 episode, and urgency with 3-4 loose bowel movements. Denies any blood in stool/urine, dysuria, fever, shortness of breath, chest pain, or expansion of bruising in the left inguinal area. His systolic blood pressure was checked at the doctor's office and noted to be as low as 60s. Family brought him to the emergency department for further evaluation. He denies any changes to his medication list: The procedure.  ED Course: Upon admission to emergency department patient  was found to be afebrile, heart rates 59-66, respirations 12-20, blood pressure  as low as 83/50, and O2 saturation maintained on room air. Lab work revealed WBC 12.6, hemoglobin 12.3, glucose 150, troponin <0.03,and all other  vitals within normal limits. Patient was given 1 L of IV fluids in the ED with improvement of blood pressures. Patient was noted to have at least 1-2 bowel movements while in the ED. Cardiology was notified but recommended TRH to admit for observation overnight.  Review of Systems: As per HPI otherwise 10 point review of systems negative.   Past Medical History:  Diagnosis Date  . Arthritis    "all over" (11/25/2015)  . CAD (coronary artery disease)    s/p CABG  06/2002; last nuc 07/18/11 normal EF and negative for ischemia; myoview 01/21/15 abnormal, cath 02/01/15 DES to prox SVG to PDA, subtotal occlusion of SVG to Diag to be treated later.  . Cancer ([place])    Right Shoulder, Left Leg- BCC, SCC, AND MELANOMA  . Hyperlipidemia   . Hypertension   . Leg pain   . OSA on CPAP   . PAD (peripheral artery disease) ([facility])    hx stent to lt.SFA, hx epithelioid hemangioendothelioma of lt SFAwth gore tex graft. 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 Presence of permanent cardiac pacemaker   . SSS (sick sinus syndrome) ([place])    Medtronic Adapta, 10/13/05  . Type II diabetes mellitus ([place])   . Urgency of urination     Past Surgical History:  Procedure Laterality Date  . CARDIAC CATHETERIZATION N/A 02/01/2015   Procedure: Left Heart Cath and Coronary Angiography;  Surgeon: Lorretta Harp, MD;  Location: [facility];  Service: Cardiovascular;  Laterality: N/A;  . CARDIAC CATHETERIZATION N/A 02/01/2015   Procedure: Coronary Stent Intervention;  Surgeon: Pearletha Forge  Gwenlyn Found, MD;  Location: [place] [facility];  Service: Cardiovascular;  Laterality: N/A;  . CARDIAC CATHETERIZATION  06/2002   "just before bypass OR"  . CARDIAC CATHETERIZATION N/A 03/01/2015   Procedure: Coronary Stent Intervention;  Surgeon: Lorretta Harp, MD;  Location: [place] [facility];  Service: Cardiovascular;  Laterality: N/A;  . CORONARY ANGIOPLASTY    . CORONARY ARTERY BYPASS GRAFT  06/2002     x5, LIMA-LAD;VG- Diag; seq VG- ramus & OM branch; VG-PDA  . EP IMPLANTABLE DEVICE N/A 04/23/2015   Procedure: PPM Generator Changeout;  Surgeon: Sanda Klein, MD;  Location: [place] [facility];  Service: Cardiovascular;  Laterality: N/A;  . FEMORAL ARTERY STENT Left ~ 2014   "taken out of my leg; couldn' catorgorize what kind so the put it under all 3"; cataroziepitheloid hemanioendotheliomau  . FOOT FRACTURE SURGERY Left 1973  . FRACTURE SURGERY    . INSERT / REPLACE / REMOVE PACEMAKER  10/13/05   right side, medtronic Adapta  . KNEE HARDWARE REMOVAL Right 1950's   "3-4 months after the insertion"  . KNEE SURGERY Right 1950's   "broke my lower leg; had to put pin in my knee to keep lower leg in place til it healed"  . PERIPHERAL VASCULAR CATHETERIZATION N/A 11/25/2015   Procedure: Lower Extremity Angiography;  Surgeon: Lorretta Harp, MD;  Location: [place] [facility];  Service: Cardiovascular;  Laterality: N/A;  . PERIPHERAL VASCULAR CATHETERIZATION Left 11/25/2015   Procedure: Peripheral Vascular Intervention;  Surgeon: Lorretta Harp, MD;  Location: [place] [facility];  Service: Cardiovascular;  Laterality: Left;  external iliac  . PERIPHERAL VASCULAR CATHETERIZATION N/A 01/03/2016   Procedure: Lower Extremity Angiography;  Surgeon: Lorretta Harp, MD;  Location: [place] [facility];  Service: Cardiovascular;  Laterality: N/A;  . PERIPHERAL VASCULAR CATHETERIZATION Left 01/03/2016   Procedure: Peripheral Vascular Intervention;  Surgeon: Lorretta Harp, MD;  Location: [place] [facility];  Service: Cardiovascular;  Laterality: Left;  SFA  . PERIPHERAL VASCULAR CATHETERIZATION Right 02/14/2016   Procedure: Peripheral Vascular Atherectomy;  Surgeon: Lorretta Harp, MD;  Location: [place] [facility];  Service: Cardiovascular;  Laterality: Right;  SFA  . POPLITEAL ARTERY STENT  01/03/2016   Contralateral access with a 7 French crossover sheath (second order catheter placement)  .  TONSILLECTOMY AND ADENOIDECTOMY    . TUMOR EXCISION Right ~ 2005   cancerous tumor removed from shoulder  . TUMOR EXCISION Right ~ 2000   benign tumor removed from under shoulder  . TUMOR EXCISION Left 06/02/2010   resection of Lt SFA wth interposition of Gore[place]Tex graft     reports that he quit smoking about 44 years ago. His smoking use included Pipe. He quit after 10.00 years of use. He has never used smokeless tobacco. He reports that he does not drink alcohol or use drugs.  Allergies  Allergen Reactions  . Peanut-Containing Drug Products Anaphylaxis and Other (See Comments)    Tongue swelling is severe  . Ace Inhibitors Other (See Comments)    Cough  . Clonidine Hcl Other (See Comments)    Patch only - skin irritation  . Coreg [Carvedilol] Itching  . Diltiazem Hcl Other (See Comments)    Unknown  . Mobic [Meloxicam] Other (See Comments)    GI upset  . Ramipril Other (See Comments)    Unknown    Family History  Problem Relation Age of Onset  . Coronary artery disease Mother   . Heart attack  Mother   . Hypertension Mother   . Heart disease Mother     Open  Heart surgery  . Diabetes Father   . Heart disease Father   . Hyperlipidemia Father   . Heart attack Father   . Hypertension Sister   . Diabetes Sister   . Heart disease Sister     Prior to Admission medications   Medication Sig Start Date End Date Taking? Authorizing Provider  acetaminophen (TYLENOL) 500 MG tablet Take 500 mg by mouth every 6 (six) hours as needed for moderate pain or headache.    Yes Historical Provider, MD  amLODipine (NORVASC) 10 MG tablet Take 10 mg by mouth daily.     Yes Historical Provider, MD  aspirin EC 81 MG tablet Take 81 mg by mouth daily.     Yes Historical Provider, MD  B Complex Vitamins (B COMPLEX-B12 PO) Take 1 tablet by mouth daily.    Yes Historical Provider, MD  cholecalciferol (VITAMIN D) 1000 units tablet Take 1,000 Units by mouth 2 (two) times daily.    Yes Historical  Provider, MD  cloNIDine (CATAPRES) 0.1 MG tablet Take 0.1 mg by mouth 2 (two) times daily.   Yes Historical Provider, MD  clopidogrel (PLAVIX) 75 MG tablet Take 1 tablet (75 mg total) by mouth daily with breakfast. 02/02/15  Yes Almyra Deforest, PA  gabapentin (NEURONTIN) 100 MG capsule Take 300 mg by mouth at bedtime.  01/16/13  Yes Historical Provider, MD  glipiZIDE (GLUCOTROL) 5 MG tablet Take 5 mg by mouth daily.     Yes Historical Provider, MD  isosorbide mononitrate (IMDUR) 30 MG 24 hr tablet Take 1 tablet (30 mg total) by mouth daily. 02/02/15  Yes Almyra Deforest, PA  losartan-hydrochlorothiazide (HYZAAR) 100-25 MG per tablet Take 1 tablet by mouth daily.     Yes Historical Provider, MD  lovastatin (MEVACOR) 40 MG tablet Take 40 mg by mouth at bedtime.     Yes Historical Provider, MD  magnesium hydroxide (MILK OF MAGNESIA) 400 MG/5ML suspension Take 30 mLs by mouth as needed for moderate constipation or indigestion.  03/07/14  Yes Historical Provider, MD  metFORMIN (GLUCOPHAGE) 1000 MG tablet Take 1,000 mg by mouth 2 (two) times daily with a meal.     Yes Historical Provider, MD  metoprolol tartrate (LOPRESSOR) 50 MG tablet Take 1 tablet (50 mg total) by mouth 2 (two) times daily. 01/04/16  Yes Arbutus Leas, NP  Multiple Vitamin (MULTIVITAMIN) capsule Take 1 capsule by mouth daily.     Yes Historical Provider, MD  Omega-3 Fatty Acids (FISH OIL PO) Take 1 tablet by mouth daily.   Yes Historical Provider, MD  tamsulosin (FLOMAX) 0.4 MG CAPS capsule Take 0.4 mg by mouth 2 (two) times daily.  03/09/15  Yes Historical Provider, MD  vitamin C (ASCORBIC ACID) 500 MG tablet Take 500 mg by mouth 2 (two) times daily.     Yes Historical Provider, MD  Doxylamine Succinate, Sleep, (SLEEP AID PO) Take 1 tablet by mouth as needed (for sleep).     Historical Provider, MD  nitroGLYCERIN (NITROSTAT) 0.4 MG SL tablet Place 1 tablet (0.4 mg total) under the tongue every 5 (five) minutes as needed for chest pain. 02/02/15   Almyra Deforest, PA    Physical Exam:    Constitutional: NAD, calm, comfortable Vitals:   02/15/16 1915 02/15/16 1930 02/15/16 2100 02/15/16 2115  BP: 152/62 140/59 170/72 151/95  Pulse: (!) 59 61 66 (!) 59  Resp: 14  20 15 19   Temp:      TempSrc:      SpO2: 98% 99% 100% 100%   Eyes: PERRL, lids and conjunctivae normal ENMT: Mucous membranes are moist. Posterior pharynx clear of any exudate or lesions.Normal dentition.  Neck: normal, supple, no masses, no thyromegaly Respiratory: clear to auscultation bilaterally, no wheezing, no crackles. Normal respiratory effort. No accessory muscle use.  Cardiovascular: Regular rate and rhythm, no murmurs / rubs / gallops. No extremity edema. 2+ pedal pulses. No carotid bruits.  Abdomen: no tenderness, no masses palpated. No hepatosplenomegaly. Bowel sounds positive.  Musculoskeletal: no clubbing / cyanosis. No joint deformity upper and lower extremities. Good ROM, no contractures. Normal muscle tone.  Skin: no rashes, lesions, ulcers. No induration Neurologic: CN 2-12 grossly intact. Sensation intact, DTR normal. Strength 5/5 in all 4.  Psychiatric: Normal judgment and insight. Alert and oriented x 3. Normal mood.     Labs on Admission: I have personally reviewed following labs and imaging studies  CBC:  Recent Labs Lab 02/15/16 0547 02/15/16 1555  WBC 9.5 12.6*  NEUTROABS  --  8.4*  HGB 11.9* 12.3*  HCT 35.9* 37.5*  MCV 85.7 86.6  PLT 135* [id number]   Basic Metabolic Panel:  Recent Labs Lab 02/15/16 0547 02/15/16 1555  NA 143 143  K 3.7 4.3  CL 109 109  CO2 25 27  GLUCOSE 177* 150*  BUN 16 18  CREATININE 0.75 1.16  CALCIUM 9.3 10.1   GFR: Estimated Creatinine Clearance: 51.6 mL/min (by C-G formula based on SCr of 1.16 mg/dL). Liver Function Tests:  Recent Labs Lab 02/15/16 1555  AST 19  ALT 18  ALKPHOS 45  BILITOT 0.6  PROT 6.5  ALBUMIN 3.8   No results for input(s): LIPASE, AMYLASE in the last 168 hours. No results for  input(s): AMMONIA in the last 168 hours. Coagulation Profile: No results for input(s): INR, PROTIME in the last 168 hours. Cardiac Enzymes:  Recent Labs Lab 02/15/16 1555  TROPONINI <0.03   BNP (last 3 results) No results for input(s): PROBNP in the last 8760 hours. HbA1C: No results for input(s): HGBA1C in the last 72 hours. CBG:  Recent Labs Lab 02/14/16 1351 02/14/16 1742 02/14/16 2055 02/15/16 0634 02/15/16 1135  GLUCAP 254* 190* 342* 225* 330*   Lipid Profile: No results for input(s): CHOL, HDL, LDLCALC, TRIG, CHOLHDL, LDLDIRECT in the last 72 hours. Thyroid Function Tests: No results for input(s): TSH, T4TOTAL, FREET4, T3FREE, THYROIDAB in the last 72 hours. Anemia Panel: No results for input(s): VITAMINB12, FOLATE, FERRITIN, TIBC, IRON, RETICCTPCT in the last 72 hours. Urine analysis:    Component Value Date/Time   COLORURINE YELLOW 09/19/2010 1045   APPEARANCEUR CLEAR 09/19/2010 1045   LABSPEC 1.031 (H) 09/19/2010 1045   PHURINE 5.5 09/19/2010 1045   GLUCOSEU NEGATIVE 09/19/2010 1045   HGBUR NEGATIVE 09/19/2010 1045   BILIRUBINUR NEGATIVE 09/19/2010 1045   KETONESUR 15 (A) 09/19/2010 1045   PROTEINUR 100 (A) 09/19/2010 1045   UROBILINOGEN 0.2 09/19/2010 1045   NITRITE NEGATIVE 09/19/2010 1045   LEUKOCYTESUR NEGATIVE 09/19/2010 1045   Sepsis Labs: No results found for this or any previous visit (from the past 240 hour(s)).   Radiological Exams on Admission: Ct Abdomen Pelvis W Contrast  Result Date: 02/15/2016 CLINICAL DATA:  Weakness lightheadedness hypotension (bruising and femoral bruising evaluate for bleeding EXAM: CT ABDOMEN AND PELVIS WITH CONTRAST TECHNIQUE: Multidetector CT imaging of the abdomen and pelvis was performed using the standard protocol following bolus administration  of intravenous contrast. CONTRAST:  100 mL Isovue 300 intravenous COMPARISON:  08/03/2010 FINDINGS: Lower chest: Visualized lung bases show no acute consolidation or  pleural effusion. Partially visualized pacer leads. Coronary artery calcifications. Heart size is mildly enlarged. No pericardial effusion. Hepatobiliary: No focal hepatic abnormality. The gallbladder is contracted. No biliary dilatation. Pancreas: Unremarkable. No pancreatic ductal dilatation or surrounding inflammatory changes. Spleen: Normal in size without focal abnormality. Adrenals/Urinary Tract: Adrenal glands within normal limits. Multiple sub cm hypodense lesions within both kidneys too small to further characterize but probably cysts. There is a large 7 cm cyst within the anterior lower pole of left kidney. The bladder is unremarkable. Stomach/Bowel: There is no dilated small bowel to suggest an obstruction. The stomach is grossly unremarkable. There is diverticular disease of the colon without acute bowel inflammation. Vascular/Lymphatic: Dense atherosclerosis a calcifications of the aorta. No aneurysm. Mild nonspecific sub cm retroperitoneal nodes. Small upper abdominal nonspecific sub cm lymph nodes, not significantly changed. Left inguinal adenopathy Reproductive: Prostate gland is enlarged and exerts mass effect on the posterior bladder. There are coarse calcifications. Other: No free air or free fluid. Musculoskeletal: Moderate skin thickening and soft tissue edema/subcutaneous soft tissue stranding over the left lower anterior abdominal wall and lateral abdominal wall. There is edema and soft tissue stranding within the left groin. There is no evidence for retroperitoneal hematoma. There is no active extravasation at the left groin. Pelvic lipoma are unchanged. Spine demonstrates marked degenerative changes with large posterior disc osteophyte complex at L5-S1. IMPRESSION: 1. Moderate soft tissue stranding and edema involving the left lower anterior and lateral abdominal wall, and extending into the left groin region, findings could relate to trauma or possibly an infection. There is no evidence for  retroperitoneal hematoma or active extravasation. 2. Multiple enlarged left inguinal lymph nodes. 3. Probable bilateral renal cysts with 7 cm cyst in the left kidney. 4. Sigmoid colon diverticular disease without acute inflammation 5. Enlarged prostate gland exerts mass effect on the posterior bladder Electronically Signed   By: Donavan Foil M.D.   On: 02/15/2016 18:00    EKG: Independently reviewed. Atrial pace complexes with bigeminy  Assessment/Plan Transient hypotension: Acute. Patient with systolic blood pressure noted as low as 60s doctor's office prior to coming to the emergency department. On arrival blood pressure as low as 83/50. Pressures responded to 1 L normal saline IV fluids. Question if  patient possibly could've been dehydrated as the cause of his acute hypotension or vasovagal due to diarrhea. - Admit to telemetry bed  - Trend cardiac troponins - IV fluids normal saline at 75 mL - Check orthostatic vital signs  - follow-up telemetry overnight   Leukocytosis: WBC elevated at 12.6 on admission previously within normal limits earlier that morning. Question if this reactive to patient's acute event. - Check urinalysis  Nausea/vomiting/Diarrhea: Worsen the possibility of a viral gastroenteritis versus C. difficile versus other - Strict ins and outs - Diarrhea persists check C. Difficile  Peripheral vascular disease/CAD s/p CABG - Continue Plavix and aspirin - Continue isosorbide mononitrate  Essential hypertension - Continue metoprolol, amlodipine,  - Held Hyzaar after urine was seen to be significantly concentrated   Diabetes mellitus type 2: Blood glucose noted to be a as high as 330 on admission after patient had dinner. - Held glipizide and metformin - Hypoglycemic protocol   - CBGs every before meals and at bedtime with sensitive sliding scale insulin. - Levemir 5 units subcutaneously daily at bedtime  Anemia: Hemoglobin 12.3  on admission. Slightly improved from  previous blood work done earlier in the day with hemoglobin noted to be 11.9. - Recheck CBC in a.m.  Hyperlipidemia - Continue pharmacy substitution of pravastatin for lovastatin  S/p PM Stable  OSA on CPAP at night  - RT to supply  BPH - Continue Flomax  DVT prophylaxis: Lovenox   Code Status: Full Family Communication:  discuss plan of care with the patient and family present at bedside  Disposition Plan: Likely discharge home if medically stable following overnight stay Consults called: none  Admission status: Observation to telemetry   Norval Morton MD [facility] Pager [phone]  If 7PM-7AM, please contact night-coverage [email or web address] Password [facility]  02/15/2016, 9:29 PM

## 2016-02-15 NOTE — ED Provider Notes (Signed)
I assumed care of this patient from Dr. Venora Maples at 1700.  Please see their note for further details of Hx, PE.  Briefly patient is a 75 y.o. male with a Hypotension Patient had a recent angioplasty of the right iliac and was discharged earlier today. Patient was at his wife's primary care doctor when they noted the patient did not look well. They obtained blood pressures and noted systolic blood pressure in the 60s. Blood pressures improved following IV fluid boluses. Patient with left inguinal hematoma. Labs closely reassuring. Currently awaiting CT abdomen and pelvis to assess for any retroperitoneal hemorrhage.  Pressures maintaining after fluid boluses.  CT of the abdomen revealed no evidence of retroperitoneal hemorrhage however did show diffuse hematoma throughout the soft tissue of the left thigh and flank. Discussed case with the cardiologist who recommended hospitalist admission for observation in the telemetry bed. Appreciate hospitalist admission.     Fatima Blank, MD 02/15/16 2238

## 2016-02-15 NOTE — ED Notes (Signed)
Patient transported to CT 

## 2016-02-15 NOTE — Discharge Summary (Signed)
Discharge Summary    Patient ID: Robert Burns,  MRN: [id number], DOB/AGE: 04-25-1940 75 y.o.  Admit date: 02/14/2016 Discharge date: 02/15/2016  Primary Care Provider: [id number] Primary Cardiologist: Dr. Gwenlyn Found  Discharge Diagnoses    Active Problems:   PVD (peripheral vascular disease) ([place])   Claudication ([place])   Allergies Allergies  Allergen Reactions  . Peanut-Containing Drug Products Anaphylaxis and Other (See Comments)    Tongue swelling is severe  . Ace Inhibitors Other (See Comments)    Cough  . Clonidine Hcl Other (See Comments)    Patch only - skin irritation  . Coreg [Carvedilol] Itching  . Diltiazem Hcl Other (See Comments)    Unknown  . Mobic [Meloxicam] Other (See Comments)    GI upset  . Ramipril Other (See Comments)    Unknown    Diagnostic Studies/Procedures  PV Angiogram/Intervention    History obtained from chart review.The patient is a very pleasant 75 year old, married Caucasian male, father of 2, grandfather to 3 grandchildren whose wife [name] who is also a patient of mine. I last saw him in the office 12/08/15.[name] He has a history of CAD status post coronary artery bypass grafting March 2004 with a LIMA to his LAD, a vein to a diagonal branch, a sequential vein to a ramus and OM branch, as well as a vein to the PDA. Last functional study performed July 28, 2011, was entirely normal. 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 He had a pacemaker placed for sick sinus syndrome November 2008 followed by Dr. Sallyanne Kuster. He has obstructive sleep apnea on CPAP. He complain of left thigh pain and I angiogram'd him revealing patent arteries though he did have a space-occupying lesion which was removed by Dr. Kellie Simmering with placement of an interposition 6-mm Gore-Tex graft. The pathology was uncertain. 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 Dr. Baxter Flattery follows his lipid profile.Since I saw him back 11/07/12 he has done  well. Followup Dopplers performed in our office 09/27/12 revealed a high-grade lesion in the distal right SFA with an ABI of 0.3. His left ABI 1.1 without obstructive disease. His most recent lower extremity Doppler Dopplers performed 9/32/16 revealed a right ABI 0.82 And a left ABI of 0.89. Over the last 3 months he's noticed anginal chest pain occurring both at rest and with exertion with left upper extremity radiation. He also complains of left lower extremity discomfort. to moderate anterolateral ischemia. Because of ongoing chest pain and a Myoview that showed anterolateral ischemia and he underwent cardiac catheterization on 02/01/15 revealing high grade segmental proximal right SVG disease and subtotally occluded diagonal branch SVG. He underwent stenting of his RCA SVG successfully. He does have continued chest pain although somewhat improved since his last procedure. I brought him back for staged diagonal branch SVG intervention on 03/01/15. This was successful and since that time he denies chest pain or shortness of breath. He underwent a generator change by Dr. Sallyanne Kuster in January for end-of-life of his [employer]onic pacemaker.Since I saw him 6 months ago he developed new left calf claudication of the last 3 months. Lower extremity arterial Doppler studies performed today revealed a decline in his left ABI from 0.87 down to 0.59 with an occluded left SFA that is a new finding. 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 He had a short  segment occlusion of the proximal left SFA with patent mid left SFA stent. He had 90% segmental calcified mid right SFA stenosis with three-vessel runoff bilaterally. I ended up stenting his left external iliac artery with a 9 mm x 40 mm long nitinol self-expanding stent which improved his symptoms somewhat although he  continuedto have lifestyle limiting claudication. He underwent staged intervention of his left SFA on 01/03/16. I stented the entire quadrant totally occluded segment with a 6 mm x 250 mm long Viabahn Stent. His claudication on the left has resolved and his ABIs normalized. He now complains of right leg medication. I did demonstrate a 90% calcified segmental mid right SFA stenosis at the time of angiography 11/25/15. He presents today for [employer] orbital rotation arthrectomy, PT A and drug-eluting balloon.  Pre Procedure Diagnosis: Peripheral arterial disease  Post Procedure Diagnosis: Peripheral arterial disease  Operators: Dr. Quay Burow  Procedures Performed:            1. Left groin access, contralateral access (second order catheter placement).            2. Placement of an N AV 6 distal protection device in the popliteal artery on the right            3. Diamondback orbital rotation arthrectomy right SFA            4. Drug eluting balloon right SFA            5. Right lower extremity angiography with runoff  PROCEDURE DESCRIPTION:   The patient was brought to the second floor [facility]iac cath lab in the the postabsorptive state. He was premedicated with Valium 5 mg by mouth, IV Versed and fentanyl. His left groin was prepped and shaved in usual sterile fashion. Xylocaine 1% was used for local anesthesia. A 7 French sheath was inserted into the left common femoral  artery using standard Seldinger technique. Contralateral access was obtained using a crossover catheter, 035 Versicore  wire and a 7 [place] multipurpose 45 cm Destination sheath. Omnipaque dye was used for the entire case (120 mL administered to patient). The patient received 9000 units of heparin intravenously with an ACT of 241. Retrograde aortic pressure was monitored during the case.  I crossed the highly calcified long segmental 95% mid right SFA stenosis with a 014 [place] core wire in an 018 CXI  End  hole catheter. I then exchanged the [place] core wire for an 014 Viper wire and placed a NAV 2 protection device in the P2 segment of the right popliteal artery. Following this I performed diamondback orbital rotation arthrectomy of the moderately long calcified mid right SFA segment with a 1.5 mm solid per up to 120,000 RPMs giving copious amounts of intra-arterial nitroglycerin. I then predilated the disease segment with a 4 mm x 120 mm long balloon followed by drug eluting balloon and suppressing with a 5 mm x 150 mm long admiral drug-eluting balloon at nominal pressures for 3 minutes resulting reduction of long calcified 90-95% mid right SFA stenosis to less than 20% residual with small linear nonflow limiting dissections. The distal protection device was then recaptured and completion angiography was performed demonstrating intact three-vessel runoff. Additional 200 g of intra-arterial nitroglycerin was administered via the sheath which was then exchanged over wire for a short 7 [place] sheath. This was then secured. The patient left the lab in stable condition.   Final Impression: Successful long segmental mid calcified right SFA stenosis diamondback orbital rotation  arthrectomy, drug eluting balloon angioplasty using distal protection for lifestyle limiting claudication. The patient tolerated the procedure well. He was on aspirin and Plavix which we will continue. The sheath will be removed and pressure held once the ACT falls below 170. He will be gently hydrated overnight and discharged home in the morning. We will get lower extremity arterial Doppler studies in our [facility] line office next week and I will see him back 2 -3 weeks thereafter.    _____________   History of Present Illness     Mr. Karlovich is a 75 year old male with a past medical history of CAD s/p CABG in March 2004 (LIMA to LAD, SVG to diag, SVG to ramus and OM, SVG to PDA), pacemaker for SSS, OSA on CPAP.   He had chest pain in  October 2016 and a Lexiscan Myoview was done, revealing moderate anterolateral ischemia. He underwent cardiac cath on 02/01/15 and that revealed high grade segmental proximal right SVG disease and subtotally occluded diagonal branch SVG. He underwent stenting of his RCA SVG successfully. Dr Gwenlyn Found brought him back for a staged diagonal branch SVG intervention on 03/01/15, this was successful and he has been chest pain free since.   He developed new left calf claudication and lower extremity arterial Doppler studies  revealed a decline in his left ABI from 0.87 down to 0.59 with an occluded left SFA that is a new finding. 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 He had a short segment occlusion of the proximal left SFA with patent mid left SFA stent. He had 90% segmental calcified mid right SFA stenosis with three-vessel runoff bilaterally. Dr. Gwenlyn Found ended up stenting his left external iliac artery with a 9 mm x 40 mm long nitinol self-expanding stent which improved his symptoms somewhat, although he continuedto have lifestyle limiting claudication. He underwent staged intervention of his left SFA on 01/03/16. Dr. Gwenlyn Found stented the entire quadrant totally occluded segment with a 6 mm x 250 mm long Viabahn Stent. His claudication on the left has resolved and his ABIs normalized.  He complains of right leg medication. There was a 90% calcified segmental mid right SFA stenosis at the time of angiography 11/25/15. He presented on 02/14/16 for diamondback orbital rotation arthrectomy, PT A and drug-eluting balloon.   Hospital Course   He had successful long segmental mid calcified right SFA stenosis diamondback orbital rotation arthrectomy, drug eluting balloon angioplasty using distal protection for lifestyle limiting claudication.  He will continue ASA and  Plavix. His groin site was stable without hematoma.   He will have lower extremity dopplers next week and he will see Dr. Gwenlyn Found 3 weeks thereafter.   He was seen today by Dr. Acie Fredrickson and deemed suitable for discharge.   _____________  Discharge Vitals Blood pressure (!) 169/62, pulse 61, temperature 98 F (36.7 C), temperature source Oral, resp. rate 17, height 5\' 8"  (1.727 m), weight 174 lb 2.6 oz (79 kg), SpO2 100 %.  Filed Weights   02/14/16 0609 02/15/16 0544  Weight: 175 lb (79.4 kg) 174 lb 2.6 oz (79 kg)    Labs & Radiologic Studies     CBC  Recent Labs  02/15/16 0547  WBC 9.5  HGB 11.9*  HCT 35.9*  MCV 85.7  PLT [id number]*   Basic Metabolic Panel  Recent Labs  02/15/16 0547  NA 143  K 3.7  CL 109  CO2 25  GLUCOSE 177*  BUN 16  CREATININE 0.75  CALCIUM 9.3    Disposition   Pt is being discharged home today in good condition.  Follow-up Plans & Appointments    Follow-up Information    Quay Burow, MD Follow up on 03/14/2016.   Specialties:  Cardiology, Radiology Why:  at 10:30 am for hospital follow up.  Contact information: [address] [place] [address] [phone]        [facility] Follow up on 02/25/2016.   Specialty:  Cardiology Why:  at 3:30 for doppler study on her legs.  Contact information: [address] [place] [phone]         Discharge Instructions    Diet - low sodium heart healthy    Complete by:  As directed    Discharge instructions    Complete by:  As directed    Groin Site Care Refer to this sheet in the next few weeks. These instructions provide you with information on caring for yourself after your procedure. Your caregiver may also give you more specific instructions. Your treatment has been planned according to current medical practices, but problems sometimes occur. Call your caregiver if you have any problems or questions after your  procedure. HOME CARE INSTRUCTIONS You may shower 24 hours after the procedure. Remove the bandage (dressing) and gently wash the site with plain soap and water. Gently pat the site dry.  Do not apply powder or lotion to the site.  Do not sit in a bathtub, swimming pool, or whirlpool for 5 to 7 days.  No bending, squatting, or lifting anything over 10 pounds (4.5 kg) as directed by your caregiver.  Inspect the site at least twice daily.  Do not drive home if you are discharged the same day of the procedure. Have someone else drive you.  You may drive 24 hours after the procedure unless otherwise instructed by your caregiver.  What to expect: Any bruising will usually fade within 1 to 2 weeks.  Blood that collects in the tissue (hematoma) may be painful to the touch. It should usually decrease in size and tenderness within 1 to 2 weeks.  SEEK IMMEDIATE MEDICAL CARE IF: You have unusual pain at the groin site or down the affected leg.  You have redness, warmth, swelling, or pain at the groin site.  You have drainage (other than a small amount of blood on the dressing).  You have chills.  You have a fever or persistent symptoms for more than 72 hours.  You have a fever and your symptoms suddenly get worse.  Your leg becomes pale, cool, tingly, or numb.  You have heavy bleeding from the site. Hold pressure on the site. .   Increase activity slowly    Complete by:  As directed       Discharge Medications   Current Discharge Medication List    CONTINUE these medications which have NOT CHANGED   Details  acetaminophen (TYLENOL) 500 MG tablet Take 500 mg by mouth every 6 (six) hours as needed for moderate pain or headache.     amLODipine (NORVASC) 10 MG tablet Take 10 mg by mouth daily.      aspirin EC 81 MG tablet Take 81 mg by mouth daily.      B Complex Vitamins (B COMPLEX-B12 PO) Take 1 tablet by mouth daily.     cholecalciferol (VITAMIN D) 1000 units tablet Take  1,000 Units by  mouth 2 (two) times daily.     cloNIDine (CATAPRES) 0.1 MG tablet Take 0.1 mg by mouth 2 (two) times daily.    clopidogrel (PLAVIX) 75 MG tablet Take 1 tablet (75 mg total) by mouth daily with breakfast. Qty: 90 tablet, Refills: 3    Doxylamine Succinate, Sleep, (SLEEP AID PO) Take 1 tablet by mouth as needed (for sleep).     gabapentin (NEURONTIN) 100 MG capsule Take 300 mg by mouth at bedtime.     glipiZIDE (GLUCOTROL) 5 MG tablet Take 5 mg by mouth daily.      isosorbide mononitrate (IMDUR) 30 MG 24 hr tablet Take 1 tablet (30 mg total) by mouth daily. Qty: 30 tablet, Refills: 11    losartan-hydrochlorothiazide (HYZAAR) 100-25 MG per tablet Take 1 tablet by mouth daily.      lovastatin (MEVACOR) 40 MG tablet Take 40 mg by mouth at bedtime.      magnesium hydroxide (MILK OF MAGNESIA) 400 MG/5ML suspension Take 30 mLs by mouth as needed for moderate constipation or indigestion.     metFORMIN (GLUCOPHAGE) 1000 MG tablet Take 1,000 mg by mouth 2 (two) times daily with a meal.      metoprolol tartrate (LOPRESSOR) 50 MG tablet Take 1 tablet (50 mg total) by mouth 2 (two) times daily. Qty: 60 tablet, Refills: 12    Multiple Vitamin (MULTIVITAMIN) capsule Take 1 capsule by mouth daily.      nitroGLYCERIN (NITROSTAT) 0.4 MG SL tablet Place 1 tablet (0.4 mg total) under the tongue every 5 (five) minutes as needed for chest pain. Qty: 25 tablet, Refills: 3    Omega-3 Fatty Acids (FISH OIL PO) Take 1 tablet by mouth daily.    tamsulosin (FLOMAX) 0.4 MG CAPS capsule Take 0.4 mg by mouth 2 (two) times daily.     vitamin C (ASCORBIC ACID) 500 MG tablet Take 500 mg by mouth 2 (two) times daily.           Outstanding Labs/Studies     Duration of Discharge Encounter   Greater than 30 minutes including physician time.  Signed, Arbutus Leas NP   Attending Note:   The patient was seen and examined.  Agree with assessment and plan as noted above.  Changes made to the above note as  needed.  Patient seen and independently examined with Jettie Booze, NP.   We discussed all aspects of the encounter. I agree with the assessment and plan as stated above.  Pt is stable following PV procedure  See progress note from same day     I have spent a total of 40 minutes with patient reviewing hospital  notes , telemetry, EKGs, labs and examining patient as well as establishing an assessment and plan that was discussed with the patient. > 50% of time was spent in direct patient care.    Thayer Headings, Brooke Bonito., MD, [facility] 02/15/2016, 4:44 PM [address],  [place] Pager [phone]    02/15/2016, 11:32 AM

## 2016-02-15 NOTE — ED Notes (Signed)
Pt given  Turkey sandwich and diet ginger ale 

## 2016-02-15 NOTE — Progress Notes (Signed)
Responded to consult. Pt sitting up, eating lunch, in good spirits, to be discharged today. Provided spiritual/emotional support and prayer. Chaplain available for follow-up.    02/15/16 1200  Clinical Encounter Type  Visited With Patient  Visit Type Initial  Referral From Nurse  Spiritual Encounters  Spiritual Needs Prayer;Emotional  Stress Factors  Patient Stress Factors Health changes

## 2016-02-15 NOTE — ED Triage Notes (Signed)
Pt reports recently having an angioplasty. Pt reports that he was at the dr with his wife and began having dizziness. Pt was found to be hypotensive. Pt continues to feel dizzy pt reports generalized weakness.

## 2016-02-16 ENCOUNTER — Encounter (HOSPITAL_COMMUNITY): Payer: Self-pay | Admitting: Nurse Practitioner

## 2016-02-16 DIAGNOSIS — R112 Nausea with vomiting, unspecified: Secondary | ICD-10-CM | POA: Diagnosis not present

## 2016-02-16 DIAGNOSIS — E118 Type 2 diabetes mellitus with unspecified complications: Secondary | ICD-10-CM

## 2016-02-16 DIAGNOSIS — I739 Peripheral vascular disease, unspecified: Secondary | ICD-10-CM | POA: Diagnosis not present

## 2016-02-16 DIAGNOSIS — I959 Hypotension, unspecified: Secondary | ICD-10-CM

## 2016-02-16 DIAGNOSIS — S301XXA Contusion of abdominal wall, initial encounter: Secondary | ICD-10-CM | POA: Diagnosis not present

## 2016-02-16 DIAGNOSIS — T148XXA Other injury of unspecified body region, initial encounter: Secondary | ICD-10-CM | POA: Diagnosis not present

## 2016-02-16 DIAGNOSIS — D72829 Elevated white blood cell count, unspecified: Secondary | ICD-10-CM | POA: Diagnosis present

## 2016-02-16 DIAGNOSIS — S301XXD Contusion of abdominal wall, subsequent encounter: Secondary | ICD-10-CM | POA: Diagnosis not present

## 2016-02-16 DIAGNOSIS — G4733 Obstructive sleep apnea (adult) (pediatric): Secondary | ICD-10-CM

## 2016-02-16 DIAGNOSIS — E785 Hyperlipidemia, unspecified: Secondary | ICD-10-CM

## 2016-02-16 DIAGNOSIS — Z9989 Dependence on other enabling machines and devices: Secondary | ICD-10-CM

## 2016-02-16 DIAGNOSIS — I10 Essential (primary) hypertension: Secondary | ICD-10-CM | POA: Diagnosis not present

## 2016-02-16 DIAGNOSIS — R197 Diarrhea, unspecified: Secondary | ICD-10-CM | POA: Diagnosis present

## 2016-02-16 LAB — BASIC METABOLIC PANEL
Anion gap: 9 (ref 5–15)
BUN: 14 mg/dL (ref 6–20)
CALCIUM: 9.4 mg/dL (ref 8.9–10.3)
CO2: 24 mmol/L (ref 22–32)
Chloride: 107 mmol/L (ref 101–111)
Creatinine, Ser: 0.72 mg/dL (ref 0.61–1.24)
GFR calc Af Amer: >60 mL/min (ref >60)
GLUCOSE: 167 mg/dL — AB (ref 65–99)
Potassium: 3.5 mmol/L (ref 3.5–5.1)
SODIUM: 140 mmol/L (ref 135–145)

## 2016-02-16 LAB — CBC
HCT: 35 % — ABNORMAL LOW (ref 39.0–52.0)
Hemoglobin: 11.2 g/dL — ABNORMAL LOW (ref 13.0–17.0)
MCH: 27.6 pg (ref 26.0–34.0)
MCHC: 32 g/dL (ref 30.0–36.0)
MCV: 86.2 fL (ref 78.0–100.0)
PLATELETS: 143 10*3/uL — AB (ref 150–400)
RBC: 4.06 MIL/uL — ABNORMAL LOW (ref 4.22–5.81)
RDW: 14.7 % (ref 11.5–15.5)
WBC: 10.4 10*3/uL (ref 4.0–10.5)

## 2016-02-16 LAB — URINALYSIS, ROUTINE W REFLEX MICROSCOPIC
BILIRUBIN URINE: NEGATIVE
GLUCOSE, UA: 500 mg/dL — AB
HGB URINE DIPSTICK: NEGATIVE
Ketones, ur: NEGATIVE mg/dL
Leukocytes, UA: NEGATIVE
Nitrite: NEGATIVE
PROTEIN: 30 mg/dL — AB
Specific Gravity, Urine: 1.039 — ABNORMAL HIGH (ref 1.005–1.030)
pH: 6 (ref 5.0–8.0)

## 2016-02-16 LAB — URINE MICROSCOPIC-ADD ON
Bacteria, UA: NONE SEEN
SQUAMOUS EPITHELIAL / LPF: NONE SEEN

## 2016-02-16 LAB — GLUCOSE, CAPILLARY
GLUCOSE-CAPILLARY: 153 mg/dL — AB (ref 65–99)
GLUCOSE-CAPILLARY: 207 mg/dL — AB (ref 65–99)
Glucose-Capillary: 149 mg/dL — ABNORMAL HIGH (ref 65–99)
Glucose-Capillary: 217 mg/dL — ABNORMAL HIGH (ref 65–99)

## 2016-02-16 LAB — TROPONIN I
Troponin I: <0.03 ng/mL (ref <0.03)
Troponin I: <0.03 ng/mL (ref <0.03)

## 2016-02-16 LAB — MAGNESIUM: Magnesium: 2.1 mg/dL (ref 1.7–2.4)

## 2016-02-16 MED ORDER — AMLODIPINE BESYLATE 5 MG PO TABS
5.0000 mg | ORAL_TABLET | Freq: Every day | ORAL | Status: DC
  Filled 2016-02-16: qty 1

## 2016-02-16 MED ORDER — METOPROLOL TARTRATE 50 MG PO TABS
75.0000 mg | ORAL_TABLET | Freq: Two times a day (BID) | ORAL | Status: DC
  Administered 2016-02-16 – 2016-02-17 (×2): 75 mg via ORAL
  Filled 2016-02-16 (×2): qty 1

## 2016-02-16 MED ORDER — SODIUM CHLORIDE 0.9 % IV SOLN
INTRAVENOUS | Status: DC
  Administered 2016-02-16: 22:00:00 via INTRAVENOUS

## 2016-02-16 NOTE — Progress Notes (Signed)
Pt. Has home cpap. Pt. States he is able to operate himself.

## 2016-02-16 NOTE — Care Management Obs Status (Signed)
[place] NOTIFICATION   Patient Details  Name: Robert Burns MRN: [id number] Date of Birth: 03/02/1941   Medicare Observation Status Notification Given:  Yes    Dawayne Patricia, RN 02/16/2016, 3:04 PM

## 2016-02-16 NOTE — Progress Notes (Signed)
Responded to consult. Had seen pt before he was discharged yesterday, then he shortly after developed distressing symptoms at a nearby doctor appt accompanying his wife to one of hers. He was in ED being evaluated till late yesterday -- is still awaiting results of tests. His wife is now home in [place]. Their daughter is with her. Pt recounted the history of his heart disease since 2004 bypasses, and how when his heart hurt a oouple of yrs ago he knew to get the bypasses checked out: two of them had closed. Pt is Catholic and wished to receive communion, so I checked that he is on the communion list and explained if he needed a priest, we could ask one to come. We prayed traditional Catholic prayers together. Chaplain available for follow-up.

## 2016-02-16 NOTE — Progress Notes (Signed)
PROGRESS NOTE    Robert Burns  [id number] DOB: 18-Nov-1940 DOA: 02/15/2016 PCP: Irven Shelling, MD   Brief Narrative: Robert Burns is a 75 y.o. male with medical history significant of HTN, HLD, CAD s/p stent/CABG, s/p PM for SSS, PVD, OSA on CPAP; who presents for low blood pressures. He had just been discharged this afternoon around 12 PM from right SFA stenosis with diamondback orbital rotation arthrectomy, and drug eluting balloon angioplasty which was performed 11-06. Patient was noted to have tolerated procedure well, but had some bleeding after removal of the sheath for the procedure which the nurse had to hold pressure for 20 minutes. He stayed overnight . Patient expressed some bruising of the left inguinal region where the catheter was placed. After he was discharged, he got in the car with his family and was driven across the street to his wife doctor's appointment. Upon him getting in the doctor's office he reports feeling dizzy and shaky. Associated symptoms included paleness in color, nausea and vomiting 1 episode, and urgency with 3-4 loose bowel movements. Denies any blood in stool/urine, dysuria, fever, shortness of breath, chest pain, or expansion of bruising in the left inguinal area. His systolic blood pressure was checked at the doctor's office and noted to be as low as 60s. Family brought him to the emergency department for further evaluation. He denies any changes to his medication list: The procedure.  ED Course: Upon admission to emergency department patient  was found to be afebrile, heart rates 59-66, respirations 12-20, blood pressure  as low as 83/50, and O2 saturation maintained on room air. Lab work revealed WBC 12.6, hemoglobin 12.3, glucose 150, troponin <0.03,and all other vitals within normal limits. Patient was given 1 L of IV fluids in the ED with improvement of blood pressures. Patient was noted to have at least 1-2 bowel movements while in the ED.  Cardiology was notified but recommended [facility] to admit for observation overnight. Ct abdomen pelvis showed: Moderate soft tissue stranding and edema involving the left lower anterior and lateral abdominal wall, and extending into the left groin region, findings could relate to trauma or possibly an infection. There is no evidence for retroperitoneal hematoma or active extravasation.  Multiple enlarged left inguinal lymph nodes.  Probable bilateral renal cysts with 7 cm cyst in the left kidney. Sigmoid colon diverticular disease without acute inflammation  Enlarged prostate gland exerts mass effect on the posterior bladder   Assessment & Plan:   Principal Problem:   Hypotension Active Problems:   Diabetes mellitus (HCC)   Essential hypertension   Hyperlipidemia   OSA on CPAP   PVD (peripheral vascular disease) (HCC)   Leukocytosis   Nausea vomiting and diarrhea   Hypotension, suspect related to diarrhea, and right groin hematoma post R SFA  PTA/DEB.  No further diarrhea.  BP improved after Bolus.  Resume only metoprolol/ follow BP trend.  On plavix and aspirin, defer to cardiology regarding stopping  these medications.   Right groin hematoma post R SFA  PTA/DEB.  Cardiology consulted.  Defer to cardiology stopping  anticoagulation: aspirin and plavix. Discussed with nurse.  Check hb in am/   HTN; BP increasing, resume metoprolol.  Resume clonidine and norvasc tomorrow if hb and BP stable.   DVT prophylaxis: scd.  Code Status: full code.  Family Communication: care discussed with patient  Disposition Plan: remain in[patient for observation of hb and BP  Consultants:   Cardiology   Procedures: none Antimicrobials: none  Subjective: He is feeling ok, denies dyspnea.  He notice some hematoma after procedure. Yesterday at his wife PCP office he was not doing well, his BP was very low.    Objective: Vitals:   02/15/16 2318 02/15/16 2319 02/15/16 2346 02/16/16 0412  BP: (!)  166/63 (!) 159/65 (!) 159/60 (!) 157/57  Pulse: 64 66 63 60  Resp:      Temp:    97.8 F (36.6 C)  TempSrc:    Axillary  SpO2:    97%  Weight:  79.4 kg (175 lb)    Height:  5\' 8"  (1.727 m)      Intake/Output Summary (Last 24 hours) at 02/16/16 0959 Last data filed at 02/15/16 1846  Gross per 24 hour  Intake             1000 ml  Output                0 ml  Net             1000 ml   Filed Weights   02/15/16 2319  Weight: 79.4 kg (175 lb)    Examination:  General exam: Appears calm and comfortable  Respiratory system: Clear to auscultation. Respiratory effort normal. Cardiovascular system: S1 & S2 heard, RRR. No JVD, murmurs, rubs, gallops or clicks. No pedal edema. Gastrointestinal system: Abdomen is nondistended, soft and nontender. No organomegaly or masses felt. Normal bowel sounds heard. Central nervous system: Alert and oriented. No focal neurological deficits. Extremities: Symmetric 5 x 5 power. Skin: No rashes, lesions or ulcers Psychiatry: Judgement and insight appear normal. Mood & affect appropriate.     Data Reviewed: I have personally reviewed following labs and imaging studies  CBC:  Recent Labs Lab 02/15/16 0547 02/15/16 1555 02/16/16 0338  WBC 9.5 12.6* 10.4  NEUTROABS  --  8.4*  --   HGB 11.9* 12.3* 11.2*  HCT 35.9* 37.5* 35.0*  MCV 85.7 86.6 86.2  PLT 135* 157 [id number]*   Basic Metabolic Panel:  Recent Labs Lab 02/15/16 0547 02/15/16 1555 02/15/16 2315 02/16/16 0338  NA 143 143  --  140  K 3.7 4.3  --  3.5  CL 109 109  --  107  CO2 25 27  --  24  GLUCOSE 177* 150*  --  167*  BUN 16 18  --  14  CREATININE 0.75 1.16  --  0.72  CALCIUM 9.3 10.1  --  9.4  MG  --   --  2.1  --    GFR: Estimated Creatinine Clearance: 74.8 mL/min (by C-G formula based on SCr of 0.72 mg/dL). Liver Function Tests:  Recent Labs Lab 02/15/16 1555  AST 19  ALT 18  ALKPHOS 45  BILITOT 0.6  PROT 6.5  ALBUMIN 3.8   No results for input(s): LIPASE, AMYLASE  in the last 168 hours. No results for input(s): AMMONIA in the last 168 hours. Coagulation Profile: No results for input(s): INR, PROTIME in the last 168 hours. Cardiac Enzymes:  Recent Labs Lab 02/15/16 1555 02/15/16 2315 02/16/16 0338  TROPONINI <0.03 <0.03 <0.03   BNP (last 3 results) No results for input(s): PROBNP in the last 8760 hours. HbA1C: No results for input(s): HGBA1C in the last 72 hours. CBG:  Recent Labs Lab 02/14/16 2055 02/15/16 0634 02/15/16 1135 02/15/16 2323 02/16/16 0606  GLUCAP 342* 225* 330* 239* 149*   Lipid Profile: No results for input(s): CHOL, HDL, LDLCALC, TRIG, CHOLHDL, LDLDIRECT in the last 72 hours.  Thyroid Function Tests: No results for input(s): TSH, T4TOTAL, FREET4, T3FREE, THYROIDAB in the last 72 hours. Anemia Panel: No results for input(s): VITAMINB12, FOLATE, FERRITIN, TIBC, IRON, RETICCTPCT in the last 72 hours. Sepsis Labs: No results for input(s): PROCALCITON, LATICACIDVEN in the last 168 hours.  No results found for this or any previous visit (from the past 240 hour(s)).       Radiology Studies: Ct Abdomen Pelvis W Contrast  Result Date: 02/15/2016 CLINICAL DATA:  Weakness lightheadedness hypotension (bruising and femoral bruising evaluate for bleeding EXAM: CT ABDOMEN AND PELVIS WITH CONTRAST TECHNIQUE: Multidetector CT imaging of the abdomen and pelvis was performed using the standard protocol following bolus administration of intravenous contrast. CONTRAST:  100 mL Isovue 300 intravenous COMPARISON:  08/03/2010 FINDINGS: Lower chest: Visualized lung bases show no acute consolidation or pleural effusion. Partially visualized pacer leads. Coronary artery calcifications. Heart size is mildly enlarged. No pericardial effusion. Hepatobiliary: No focal hepatic abnormality. The gallbladder is contracted. No biliary dilatation. Pancreas: Unremarkable. No pancreatic ductal dilatation or surrounding inflammatory changes. Spleen:  Normal in size without focal abnormality. Adrenals/Urinary Tract: Adrenal glands within normal limits. Multiple sub cm hypodense lesions within both kidneys too small to further characterize but probably cysts. There is a large 7 cm cyst within the anterior lower pole of left kidney. The bladder is unremarkable. Stomach/Bowel: There is no dilated small bowel to suggest an obstruction. The stomach is grossly unremarkable. There is diverticular disease of the colon without acute bowel inflammation. Vascular/Lymphatic: Dense atherosclerosis a calcifications of the aorta. No aneurysm. Mild nonspecific sub cm retroperitoneal nodes. Small upper abdominal nonspecific sub cm lymph nodes, not significantly changed. Left inguinal adenopathy Reproductive: Prostate gland is enlarged and exerts mass effect on the posterior bladder. There are coarse calcifications. Other: No free air or free fluid. Musculoskeletal: Moderate skin thickening and soft tissue edema/subcutaneous soft tissue stranding over the left lower anterior abdominal wall and lateral abdominal wall. There is edema and soft tissue stranding within the left groin. There is no evidence for retroperitoneal hematoma. There is no active extravasation at the left groin. Pelvic lipoma are unchanged. Spine demonstrates marked degenerative changes with large posterior disc osteophyte complex at L5-S1. IMPRESSION: 1. Moderate soft tissue stranding and edema involving the left lower anterior and lateral abdominal wall, and extending into the left groin region, findings could relate to trauma or possibly an infection. There is no evidence for retroperitoneal hematoma or active extravasation. 2. Multiple enlarged left inguinal lymph nodes. 3. Probable bilateral renal cysts with 7 cm cyst in the left kidney. 4. Sigmoid colon diverticular disease without acute inflammation 5. Enlarged prostate gland exerts mass effect on the posterior bladder Electronically Signed   By: Donavan Foil M.D.   On: 02/15/2016 18:00        Scheduled Meds: . aspirin EC  81 mg Oral Daily  . cholecalciferol  1,000 Units Oral BID  . clopidogrel  75 mg Oral Q breakfast  . gabapentin  300 mg Oral QHS  . insulin aspart  0-5 Units Subcutaneous QHS  . insulin aspart  0-9 Units Subcutaneous TID WC  . insulin detemir  5 Units Subcutaneous QHS  . isosorbide mononitrate  30 mg Oral Daily  . metoprolol  50 mg Oral BID  . pravastatin  40 mg Oral q1800  . tamsulosin  0.4 mg Oral BID   Continuous Infusions: . sodium chloride       LOS: 0 days    Time spent: 35 minutes.  Elmarie Shiley, MD [facility] Pager [phone]  If 7PM-7AM, please contact night-coverage [email or web address] Password [email or web address] 02/16/2016, 9:59 AM

## 2016-02-16 NOTE — Consult Note (Signed)
Cardiology Consult    Patient ID: Robert Burns MRN: [id number], DOB/AGE: 1940/12/24   Admit date: 02/15/2016 Date of Consult: 02/16/2016  Primary Physician: Irven Shelling, MD Primary Cardiologist: Adora Fridge, MD  Requesting Provider: B. Regalado  Patient Profile    75 y/o ? with a h/o CAD s/p CABG and PVD s/p multiple interventions including R SFA PTA/DEB on 11/6, who was readmitted 11/7 secondary to hypotension, n, v, diarrhea, and left groin hematoma.  Past Medical History   Past Medical History:  Diagnosis Date  . Arthritis    "all over" (11/25/2015)  . CAD (coronary artery disease)    a. s/p CABG  06/2002; b. 07/18/11 nuc study: normal EF and negative for ischemia; c. 02/01/15 PCI: DES to prox SVG to PDA, staged PCI of SVG to Diag in 02/2015.  [name] Cancer (HCC)    Right Shoulder, Left Leg- BCC, SCC, AND MELANOMA  . Hyperlipidemia   . Hypertension   . Leg pain   . OSA on CPAP   . PAD (peripheral artery disease) ([place])    a. 10/2002 L SFA PTA/BMS; b. 8/17 LE Angio: LEIA 90 (9x40 self exp stent), LSFA short segment prox occlusion (staged PTA/stenting 01/03/2016), patent mid stent, RSFA 53m (staged PTA/DEB 02/14/2016).  . Presence of permanent cardiac pacemaker   . SSS (sick sinus syndrome) ([place])    a. s/p PPM in 2007 with gen change 04/2015 - Medtronic Adapta ADDRL1, ser # [id number] H.  . Type II diabetes mellitus ([place])   . Urgency of urination     Past Surgical History:  Procedure Laterality Date  . CARDIAC CATHETERIZATION N/A 02/01/2015   Procedure: Left Heart Cath and Coronary Angiography;  Surgeon: Lorretta Harp, MD;  Location: [place] [facility];  Service: Cardiovascular;  Laterality: N/A;  . CARDIAC CATHETERIZATION N/A 02/01/2015   Procedure: Coronary Stent Intervention;  Surgeon: Lorretta Harp, MD;  Location: [place] [facility];  Service: Cardiovascular;  Laterality: N/A;  . CARDIAC CATHETERIZATION  06/2002   "just before bypass OR"  . CARDIAC CATHETERIZATION N/A  03/01/2015   Procedure: Coronary Stent Intervention;  Surgeon: Lorretta Harp, MD;  Location: [place];  Service: Cardiovascular;  Laterality: N/A;  . CORONARY ANGIOPLASTY    . CORONARY ARTERY BYPASS GRAFT  06/2002   x5, LIMA-LAD;VG- Diag; seq VG- ramus & OM branch; VG-PDA  . EP IMPLANTABLE DEVICE N/A 04/23/2015   Procedure: PPM Generator Changeout;  Surgeon: Sanda Klein, MD;  Location: [place] [facility];  Service: Cardiovascular;  Laterality: N/A;  . FEMORAL ARTERY STENT Left ~ 2014   "taken out of my leg; couldn' catorgorize what kind so the put it under all 3"; cataroziepitheloid hemanioendotheliomau  . FOOT FRACTURE SURGERY Left 1973  . FRACTURE SURGERY    . INSERT / REPLACE / REMOVE PACEMAKER  10/13/05   right side, medtronic Adapta  . KNEE HARDWARE REMOVAL Right 1950's   "3-4 months after the insertion"  . KNEE SURGERY Right 1950's   "broke my lower leg; had to put pin in my knee to keep lower leg in place til it healed"  . PERIPHERAL VASCULAR CATHETERIZATION N/A 11/25/2015   Procedure: Lower Extremity Angiography;  Surgeon: Lorretta Harp, MD;  Location: [facility] [facility];  Service: Cardiovascular;  Laterality: N/A;  . PERIPHERAL VASCULAR CATHETERIZATION Left 11/25/2015   Procedure: Peripheral Vascular Intervention;  Surgeon: Lorretta Harp, MD;  Location: [place] [facility];  Service: Cardiovascular;  Laterality: Left;  external  iliac  . PERIPHERAL VASCULAR CATHETERIZATION N/A 01/03/2016   Procedure: Lower Extremity Angiography;  Surgeon: Lorretta Harp, MD;  Location: [facility] [facility];  Service: Cardiovascular;  Laterality: N/A;  . PERIPHERAL VASCULAR CATHETERIZATION Left 01/03/2016   Procedure: Peripheral Vascular Intervention;  Surgeon: Lorretta Harp, MD;  Location: [place] [facility];  Service: Cardiovascular;  Laterality: Left;  SFA  . PERIPHERAL VASCULAR CATHETERIZATION Right 02/14/2016   Procedure: Peripheral Vascular Atherectomy;  Surgeon: Lorretta Harp, MD;  Location: [place];  Service: Cardiovascular;  Laterality: Right;  SFA  . POPLITEAL ARTERY STENT  01/03/2016   Contralateral access with a 7 French crossover sheath (second order catheter placement)  . TONSILLECTOMY AND ADENOIDECTOMY    . TUMOR EXCISION Right ~ 2005   cancerous tumor removed from shoulder  . TUMOR EXCISION Right ~ 2000   benign tumor removed from under shoulder  . TUMOR EXCISION Left 06/02/2010   resection of Lt SFA wth interposition of Gore-Tex graft     Allergies  Allergies  Allergen Reactions  . Peanut-Containing Drug Products Anaphylaxis and Other (See Comments)    Tongue swelling is severe  . Ace Inhibitors Other (See Comments)    Cough  . Clonidine Hcl Other (See Comments)    Patch only - skin irritation  . Coreg [Carvedilol] Itching  . Diltiazem Hcl Other (See Comments)    Unknown  . Mobic [Meloxicam] Other (See Comments)    GI upset  . Ramipril Other (See Comments)    Unknown    History of Present Illness    75 y/o ? with the above complex PMH including CAD s/p CABG along with VG PCI's, HTN, HL, DM, OSA on CPAP, SSS s/p PPM, and PVD s/p multiple interventions.  Most recently, he underwent LEIA stenting in September with staged PTA to the L SFA.  He was noted @ that time to also have R SFA occlusive dzs and thus arrangements were made for staged PTA of that vessel as well. He underwent PTA and DEB to the R SFA via a L fem approach on 11/6.  Post-procedure course was complicated by the development of a L groin hematoma, though H/H were stable and he was ambulating fine on 11/7, thus he was d/c'd.  Following d/c, he went with his wife to a doctor's appt and while there, began to have GI distress and the sensation of needing to have a bowel movement.  He then became lightheaded and weak.  His BP was checked and he was noted to be hypotensive.  He was transferred to the [facility] ED, where he was hypotensive with pressures in the 80's.  He was  treated with IVF.  H/H were stable. He has multiple loose bowel mvmts and also vomited.  Trop was nl.  CT of the abd and pelvis were w/o evidence of RP hematoma.  H/H stable this AM.  We've been asked to eval.  He is eager to go home.  Inpatient Medications    . aspirin EC  81 mg Oral Daily  . cholecalciferol  1,000 Units Oral BID  . clopidogrel  75 mg Oral Q breakfast  . gabapentin  300 mg Oral QHS  . insulin aspart  0-5 Units Subcutaneous QHS  . insulin aspart  0-9 Units Subcutaneous TID WC  . insulin detemir  5 Units Subcutaneous QHS  . isosorbide mononitrate  30 mg Oral Daily  . metoprolol  50 mg Oral BID  . pravastatin  40 mg Oral q1800  . tamsulosin  0.4 mg Oral BID    Family History    Family History  Problem Relation Age of Onset  . Coronary artery disease Mother   . Heart attack Mother   . Hypertension Mother   . Heart disease Mother     Open  Heart surgery  . Diabetes Father   . Heart disease Father   . Hyperlipidemia Father   . Heart attack Father   . Hypertension Sister   . Diabetes Sister   . Heart disease Sister     Social History    Social History   Social History  . Marital status: Married    Spouse name: N/A  . Number of children: N/A  . Years of education: N/A   Occupational History  . Not on file.   Social History Main Topics  . Smoking status: Former Smoker    Years: 10.00    Types: Pipe    Quit date: 72  . Smokeless tobacco: Never Used     Comment: "quit smoking in 1973"  . Alcohol use No  . Drug use: No  . Sexual activity: Not Currently   Other Topics Concern  . Not on file   Social History Narrative  . No narrative on file     Review of Systems    General:  No chills, fever, night sweats or weight changes.  Cardiovascular:  No chest pain, dyspnea on exertion, edema, orthopnea, palpitations, paroxysmal nocturnal dyspnea. +++ L groin pain and hematoma. Dermatological: No rash, lesions/masses Respiratory: No cough,  dyspnea Urologic: No hematuria, dysuria Abdominal:   +++ nausea, vomiting, diarrhea prior to admission. No bright red blood per rectum, melena, or hematemesis Neurologic:  No visual changes, wkns, changes in mental status. All other systems reviewed and are otherwise negative except as noted above.  Physical Exam    Blood pressure (!) 150/62, pulse 62, temperature 98.2 F (36.8 C), temperature source Oral, resp. rate 18, height 5\' 8"  (1.727 m), weight 175 lb (79.4 kg), SpO2 99 %.  General: Pleasant, NAD Psych: Normal affect. Neuro: Alert and oriented X 3. Moves all extremities spontaneously. HEENT: Normal  Neck: Supple without bruits or JVD. Lungs:  Resp regular and unlabored, CTA. Heart: RRR, soft systolic murmur @ apex, no s3, s4. Abdomen: Soft, non-tender, non-distended, BS + x 4. No abd bruits or flank tenderness. Extremities: No clubbing, cyanosis or edema. DP/PT/Radials 1+ and equal bilaterally.  Large L groin hematoma with soft bruit - roughly 3x4 cm.  Soft R femoral bruit as well.  Labs     Recent Labs  02/15/16 1555 02/15/16 2315 02/16/16 0338 02/16/16 1105  TROPONINI <0.03 <0.03 <0.03 <0.03   Lab Results  Component Value Date   WBC 10.4 02/16/2016   HGB 11.2 (L) 02/16/2016   HCT 35.0 (L) 02/16/2016   MCV 86.2 02/16/2016   PLT 143 (L) 02/16/2016    Recent Labs Lab 02/15/16 1555 02/16/16 0338  NA 143 140  K 4.3 3.5  CL 109 107  CO2 27 24  BUN 18 14  CREATININE 1.16 0.72  CALCIUM 10.1 9.4  PROT 6.5  --   BILITOT 0.6  --   ALKPHOS 45  --   ALT 18  --   AST 19  --   GLUCOSE 150* 167*    Radiology Studies    Ct Abdomen Pelvis W Contrast  Result Date: 02/15/2016 CLINICAL DATA:  Weakness lightheadedness hypotension (bruising and femoral bruising evaluate  for bleeding EXAM: CT ABDOMEN AND PELVIS WITH CONTRAST TECHNIQUE: Multidetector CT imaging of the abdomen and pelvis was performed using the standard protocol following bolus administration of  intravenous contrast. CONTRAST:  100 mL Isovue 300 intravenous COMPARISON:  08/03/2010 FINDINGS: Lower chest: Visualized lung bases show no acute consolidation or pleural effusion. Partially visualized pacer leads. Coronary artery calcifications. Heart size is mildly enlarged. No pericardial effusion. Hepatobiliary: No focal hepatic abnormality. The gallbladder is contracted. No biliary dilatation. Pancreas: Unremarkable. No pancreatic ductal dilatation or surrounding inflammatory changes. Spleen: Normal in size without focal abnormality. Adrenals/Urinary Tract: Adrenal glands within normal limits. Multiple sub cm hypodense lesions within both kidneys too small to further characterize but probably cysts. There is a large 7 cm cyst within the anterior lower pole of left kidney. The bladder is unremarkable. Stomach/Bowel: There is no dilated small bowel to suggest an obstruction. The stomach is grossly unremarkable. There is diverticular disease of the colon without acute bowel inflammation. Vascular/Lymphatic: Dense atherosclerosis a calcifications of the aorta. No aneurysm. Mild nonspecific sub cm retroperitoneal nodes. Small upper abdominal nonspecific sub cm lymph nodes, not significantly changed. Left inguinal adenopathy Reproductive: Prostate gland is enlarged and exerts mass effect on the posterior bladder. There are coarse calcifications. Other: No free air or free fluid. Musculoskeletal: Moderate skin thickening and soft tissue edema/subcutaneous soft tissue stranding over the left lower anterior abdominal wall and lateral abdominal wall. There is edema and soft tissue stranding within the left groin. There is no evidence for retroperitoneal hematoma. There is no active extravasation at the left groin. Pelvic lipoma are unchanged. Spine demonstrates marked degenerative changes with large posterior disc osteophyte complex at L5-S1. IMPRESSION: 1. Moderate soft tissue stranding and edema involving the left  lower anterior and lateral abdominal wall, and extending into the left groin region, findings could relate to trauma or possibly an infection. There is no evidence for retroperitoneal hematoma or active extravasation. 2. Multiple enlarged left inguinal lymph nodes. 3. Probable bilateral renal cysts with 7 cm cyst in the left kidney. 4. Sigmoid colon diverticular disease without acute inflammation 5. Enlarged prostate gland exerts mass effect on the posterior bladder Electronically Signed   By: Donavan Foil M.D.   On: 02/15/2016 18:00    ECG & Cardiac Imaging    A paced, V sensed, 61, freq pvc's, prior ant infarct w/ ivcd.  Assessment & Plan    1.  Left groin hematoma:  S/p PTA/DEB of the R SFA via L SFA approach.  Hematoma noted post procedure though H/H stable  discharged 11/7.  Developed presyncope/hypotension/GI symptoms of upset, n, v, diarrhea - ? Vasovagal.  CT in ER did not show RP bleed.  Large hematoma persists.  Hemodynamically stable. H/H stable. Soft bruit noted, potentially r/t PVD, though given hematoma will need to r/o PSA.  U/S ordered.  If ok, he can go home.  2.  CAD:  Doing well w/o recent c/p or dyspnea.  Cont home meds.  3.  HTN:  BP trending high.  Will titrate  blocker to 75 bid.  4.  HL:  Cont statin therapy.  Signed, Murray Hodgkins, NP 02/16/2016, 4:01 PM   Attending Note:   The patient was seen and examined.  Agree with assessment and plan as noted above.  Changes made to the above note as needed.  Patient seen and independently examined with  Ignacia Bayley, NP .   We discussed all aspects of the encounter. I agree with the assessment and plan  as stated above.  Pt had a dizzy episode / hypotensive episode this am. Was sent to the H & H is stable CT does not show large retroperitoneal bleed. Awaiting vascular tech to perform a duplex on the hematoma.  Possible Dc tomorrow if vascular study is stable  His BP is now stable - actually a bit elevated.       I have spent a total of 40 minutes with patient reviewing hospital  notes , telemetry, EKGs, labs and examining patient as well as establishing an assessment and plan that was discussed with the patient. > 50% of time was spent in direct patient care.    Thayer Headings, Brooke Bonito., MD, [facility] 02/16/2016, 4:25 PM [address]  [place] Pager [phone]

## 2016-02-17 ENCOUNTER — Other Ambulatory Visit: Payer: Self-pay | Admitting: Cardiology

## 2016-02-17 ENCOUNTER — Observation Stay (HOSPITAL_BASED_OUTPATIENT_CLINIC_OR_DEPARTMENT_OTHER): Payer: Medicare HMO

## 2016-02-17 DIAGNOSIS — S301XXD Contusion of abdominal wall, subsequent encounter: Secondary | ICD-10-CM

## 2016-02-17 DIAGNOSIS — I10 Essential (primary) hypertension: Secondary | ICD-10-CM | POA: Diagnosis not present

## 2016-02-17 DIAGNOSIS — I739 Peripheral vascular disease, unspecified: Secondary | ICD-10-CM

## 2016-02-17 DIAGNOSIS — I959 Hypotension, unspecified: Secondary | ICD-10-CM | POA: Diagnosis not present

## 2016-02-17 LAB — CBC
HEMATOCRIT: 35.9 % — AB (ref 39.0–52.0)
HEMOGLOBIN: 11.6 g/dL — AB (ref 13.0–17.0)
MCH: 28 pg (ref 26.0–34.0)
MCHC: 32.3 g/dL (ref 30.0–36.0)
MCV: 86.5 fL (ref 78.0–100.0)
Platelets: 153 10*3/uL (ref 150–400)
RBC: 4.15 MIL/uL — AB (ref 4.22–5.81)
RDW: 14.5 % (ref 11.5–15.5)
WBC: 11.2 10*3/uL — ABNORMAL HIGH (ref 4.0–10.5)

## 2016-02-17 LAB — GLUCOSE, CAPILLARY
Glucose-Capillary: 161 mg/dL — ABNORMAL HIGH (ref 65–99)
Glucose-Capillary: 252 mg/dL — ABNORMAL HIGH (ref 65–99)

## 2016-02-17 MED ORDER — AMLODIPINE BESYLATE 10 MG PO TABS
10.0000 mg | ORAL_TABLET | Freq: Every day | ORAL | Status: DC
  Administered 2016-02-17: 10 mg via ORAL
  Filled 2016-02-17: qty 1

## 2016-02-17 MED ORDER — HYDROCHLOROTHIAZIDE 25 MG PO TABS
25.0000 mg | ORAL_TABLET | Freq: Every day | ORAL | Status: DC
Start: 2016-02-17 — End: 2016-02-17
  Administered 2016-02-17: 25 mg via ORAL
  Filled 2016-02-17: qty 1

## 2016-02-17 MED ORDER — ASPIRIN EC 81 MG PO TBEC
81.0000 mg | DELAYED_RELEASE_TABLET | Freq: Every day | ORAL | Status: DC
Start: 2016-02-17 — End: 2016-02-17
  Administered 2016-02-17: 81 mg via ORAL
  Filled 2016-02-17: qty 1

## 2016-02-17 MED ORDER — POTASSIUM CHLORIDE CRYS ER 20 MEQ PO TBCR: 20.0000 meq | EXTENDED_RELEASE_TABLET | Freq: Every day | ORAL | 0 refills | Status: DC

## 2016-02-17 MED ORDER — POTASSIUM CHLORIDE CRYS ER 20 MEQ PO TBCR
20.0000 meq | EXTENDED_RELEASE_TABLET | Freq: Every day | ORAL | Status: DC
  Administered 2016-02-17: 20 meq via ORAL
  Filled 2016-02-17: qty 1

## 2016-02-17 MED ORDER — LOSARTAN POTASSIUM 50 MG PO TABS
100.0000 mg | ORAL_TABLET | Freq: Every day | ORAL | Status: DC
  Administered 2016-02-17: 100 mg via ORAL
  Filled 2016-02-17: qty 2

## 2016-02-17 MED ORDER — CLOPIDOGREL BISULFATE 75 MG PO TABS: 75.0000 mg | ORAL_TABLET | Freq: Every day | ORAL | Status: DC

## 2016-02-17 NOTE — Discharge Summary (Signed)
Physician Discharge Summary  Robert Burns [id number] DOB: March 16, 1941 DOA: 02/15/2016  PCP: Irven Shelling, MD  Admit date: 02/15/2016 Discharge date: 02/17/2016  Admitted From: Home  Disposition:  Home   Recommendations for Outpatient Follow-up:  1. Follow up with PCP in 1-2 weeks 2. Please obtain BMP/CBC in one week 3. Needs further adjustment of BP medications 4. Follow up with Dr Gwenlyn Found for further care of PVD    Discharge Condition:Stable.  CODE STATUS: Full code.  Diet recommendation: Heart Healthy   Brief/Interim Summary: Robert Sain Swiftis a 75 y.o.malewith medical history significant of HTN, HLD, CAD s/p stent/CABG, s/p PM for SSS, PVD, OSA on CPAP; who presents for low blood pressures. He had just been discharged this afternoon around 12 PM from right SFA stenosis with diamondback orbital rotation arthrectomy, and drug eluting balloon angioplastywhich was performed 11-06. Patient was noted to have tolerated procedure well, but had some bleeding after removal of the sheath for the procedure which the nurse had to hold pressure for 20 minutes. He stayed overnight . Patient expressed some bruising of the left inguinal region where the catheter was placed. After he was discharged, he got in the car with his family and was driven across the street to his wife doctor'sappointment. Upon him getting in the doctor's office he reports feeling dizzy and shaky. Associated symptoms includedpaleness incolor, nausea and vomiting 1 episode, and urgency with 3-4 loose bowel movements. Denies any blood in stool/urine, dysuria, fever, shortness of breath, chest pain, or expansion of bruising in the left inguinal area. His systolic blood pressure was checked at the doctor's office and noted to be as low as 60s. Family brought him to the emergency department for further evaluation. He denies any changes to his medication list: The procedure.  ED Course:Upon admission to emergency  department patient was found to be afebrile, heart rates 59-66, respirations 12-20,blood pressure as low as 83/50, and O2 saturation maintained on room air. Lab work revealed WBC 12.6, hemoglobin 12.3, glucose 150, troponin <0.03,and all other vitals within normal limits. Patient was given 1 L of IV fluids in the ED with improvement of blood pressures. Patient was noted to have at least 1-2 bowel movements while in the ED. Cardiology was notified but recommended [facility] to admit for observation overnight. Ct abdomen pelvis showed: Moderate soft tissue stranding and edema involving the left lower anterior and lateral abdominal wall, and extending into the left groin region, findings could relate to trauma or possibly an infection. There is no evidence for retroperitoneal hematoma or active extravasation.  Multiple enlarged left inguinal lymph nodes.  Probable bilateral renal cysts with 7 cm cyst in the left kidney. Sigmoid colon diverticular disease without acute inflammation  Enlarged prostate gland exerts mass effect on the posterior bladder   Assessment & Plan:  Hypotension, suspect related to diarrhea, and right groin hematoma post R SFA  PTA/DEB, vaso-vagal. .  No further diarrhea.  BP improved after Bolus.  Hb and BP stable.    Right groin hematoma post R SFA  PTA/DEB.  Cardiology consulted.  Discussed with Dr Cathie Olden, ok to continue with aspirin and plavix.  Hb stable. BP stable.   HTN; BP increasing, resume HCTZ, Cozaar and Norvasc.  Will need to resume clonidine as needed.   Discharge Diagnoses:  Principal Problem:   Hypotension Active Problems:   Diabetes mellitus ([facility])   Essential hypertension   Hyperlipidemia   OSA on CPAP   PVD (peripheral vascular disease) ([place])  Leukocytosis   Nausea vomiting and diarrhea    Discharge Instructions  Discharge Instructions    Diet - low sodium heart healthy    Complete by:  As directed    Increase activity slowly    Complete by:   As directed        Medication List    STOP taking these medications   cloNIDine 0.1 MG tablet Commonly known as:  CATAPRES     TAKE these medications   acetaminophen 500 MG tablet Commonly known as:  TYLENOL Take 500 mg by mouth every 6 (six) hours as needed for moderate pain or headache.   amLODipine 10 MG tablet Commonly known as:  NORVASC Take 10 mg by mouth daily.   aspirin EC 81 MG tablet Take 81 mg by mouth daily.   B COMPLEX-B12 PO Take 1 tablet by mouth daily.   cholecalciferol 1000 units tablet Commonly known as:  VITAMIN D Take 1,000 Units by mouth 2 (two) times daily.   clopidogrel 75 MG tablet Commonly known as:  PLAVIX Take 1 tablet (75 mg total) by mouth daily with breakfast.   FISH OIL PO Take 1 tablet by mouth daily.   gabapentin 100 MG capsule Commonly known as:  NEURONTIN Take 300 mg by mouth at bedtime.   glipiZIDE 5 MG tablet Commonly known as:  GLUCOTROL Take 5 mg by mouth daily.   isosorbide mononitrate 30 MG 24 hr tablet Commonly known as:  IMDUR Take 1 tablet (30 mg total) by mouth daily.   losartan-hydrochlorothiazide 100-25 MG tablet Commonly known as:  HYZAAR Take 1 tablet by mouth daily.   lovastatin 40 MG tablet Commonly known as:  MEVACOR Take 40 mg by mouth at bedtime.   magnesium hydroxide 400 MG/5ML suspension Commonly known as:  MILK OF MAGNESIA Take 30 mLs by mouth as needed for moderate constipation or indigestion.   metFORMIN 1000 MG tablet Commonly known as:  GLUCOPHAGE Take 1,000 mg by mouth 2 (two) times daily with a meal.   metoprolol 50 MG tablet Commonly known as:  LOPRESSOR Take 1 tablet (50 mg total) by mouth 2 (two) times daily.   multivitamin capsule Take 1 capsule by mouth daily.   nitroGLYCERIN 0.4 MG SL tablet Commonly known as:  NITROSTAT Place 1 tablet (0.4 mg total) under the tongue every 5 (five) minutes as needed for chest pain.   potassium chloride SA 20 MEQ tablet Commonly known as:   K-DUR,KLOR-CON Take 1 tablet (20 mEq total) by mouth daily. Start taking on:  02/18/2016   SLEEP AID PO Take 1 tablet by mouth as needed (for sleep).   tamsulosin 0.4 MG Caps capsule Commonly known as:  FLOMAX Take 0.4 mg by mouth 2 (two) times daily.   vitamin C 500 MG tablet Commonly known as:  ASCORBIC ACID Take 500 mg by mouth 2 (two) times daily.      Follow-up Information    Irven Shelling, MD Follow up in 1 week(s).   Specialty:  Internal Medicine Contact information: [address]  [place] [address] [phone]          Allergies  Allergen Reactions  . Peanut-Containing Drug Products Anaphylaxis and Other (See Comments)    Tongue swelling is severe  . Ace Inhibitors Other (See Comments)    Cough  . Clonidine Hcl Other (See Comments)    Patch only - skin irritation  . Coreg [Carvedilol] Itching  . Diltiazem Hcl Other (See Comments)  Unknown  . Mobic [Meloxicam] Other (See Comments)    GI upset  . Ramipril Other (See Comments)    Unknown    Consultations: Cardiology   Procedures/Studies: Ct Abdomen Pelvis W Contrast  Result Date: 02/15/2016 CLINICAL DATA:  Weakness lightheadedness hypotension (bruising and femoral bruising evaluate for bleeding EXAM: CT ABDOMEN AND PELVIS WITH CONTRAST TECHNIQUE: Multidetector CT imaging of the abdomen and pelvis was performed using the standard protocol following bolus administration of intravenous contrast. CONTRAST:  100 mL Isovue 300 intravenous COMPARISON:  08/03/2010 FINDINGS: Lower chest: Visualized lung bases show no acute consolidation or pleural effusion. Partially visualized pacer leads. Coronary artery calcifications. Heart size is mildly enlarged. No pericardial effusion. Hepatobiliary: No focal hepatic abnormality. The gallbladder is contracted. No biliary dilatation. Pancreas: Unremarkable. No pancreatic ductal dilatation or surrounding inflammatory changes. Spleen: Normal in size  without focal abnormality. Adrenals/Urinary Tract: Adrenal glands within normal limits. Multiple sub cm hypodense lesions within both kidneys too small to further characterize but probably cysts. There is a large 7 cm cyst within the anterior lower pole of left kidney. The bladder is unremarkable. Stomach/Bowel: There is no dilated small bowel to suggest an obstruction. The stomach is grossly unremarkable. There is diverticular disease of the colon without acute bowel inflammation. Vascular/Lymphatic: Dense atherosclerosis a calcifications of the aorta. No aneurysm. Mild nonspecific sub cm retroperitoneal nodes. Small upper abdominal nonspecific sub cm lymph nodes, not significantly changed. Left inguinal adenopathy Reproductive: Prostate gland is enlarged and exerts mass effect on the posterior bladder. There are coarse calcifications. Other: No free air or free fluid. Musculoskeletal: Moderate skin thickening and soft tissue edema/subcutaneous soft tissue stranding over the left lower anterior abdominal wall and lateral abdominal wall. There is edema and soft tissue stranding within the left groin. There is no evidence for retroperitoneal hematoma. There is no active extravasation at the left groin. Pelvic lipoma are unchanged. Spine demonstrates marked degenerative changes with large posterior disc osteophyte complex at L5-S1. IMPRESSION: 1. Moderate soft tissue stranding and edema involving the left lower anterior and lateral abdominal wall, and extending into the left groin region, findings could relate to trauma or possibly an infection. There is no evidence for retroperitoneal hematoma or active extravasation. 2. Multiple enlarged left inguinal lymph nodes. 3. Probable bilateral renal cysts with 7 cm cyst in the left kidney. 4. Sigmoid colon diverticular disease without acute inflammation 5. Enlarged prostate gland exerts mass effect on the posterior bladder Electronically Signed   By: Donavan Foil M.D.    On: 02/15/2016 18:00      Subjective: Patient feeling well.   Discharge Exam: Vitals:   02/17/16 1100 02/17/16 1234  BP: (!) 163/68 (!) 141/68  Pulse:  62  Resp:  19  Temp:  98.7 F (37.1 C)   Vitals:   02/17/16 0518 02/17/16 0700 02/17/16 1100 02/17/16 1234  BP: (!) 161/59 (!) 184/49 (!) 163/68 (!) 141/68  Pulse: 60 62  62  Resp: 20 20  19   Temp: 98.5 F (36.9 C) 98.3 F (36.8 C)  98.7 F (37.1 C)  TempSrc: Oral Oral  Oral  SpO2: 97% 99%    Weight:      Height:        General: Pt is alert, awake, not in acute distress Cardiovascular: RRR, S1/S2 +, no rubs, no gallops Respiratory: CTA bilaterally, no wheezing, no rhonchi Abdominal: Soft, NT, ND, bowel sounds + Extremities: no edema, no cyanosis    The results of significant diagnostics from this hospitalization (  including imaging, microbiology, ancillary and laboratory) are listed below for reference.     Microbiology: No results found for this or any previous visit (from the past 240 hour(s)).   Labs: BNP (last 3 results) No results for input(s): BNP in the last 8760 hours. Basic Metabolic Panel:  Recent Labs Lab 02/15/16 0547 02/15/16 1555 02/15/16 2315 02/16/16 0338  NA 143 143  --  140  K 3.7 4.3  --  3.5  CL 109 109  --  107  CO2 25 27  --  24  GLUCOSE 177* 150*  --  167*  BUN 16 18  --  14  CREATININE 0.75 1.16  --  0.72  CALCIUM 9.3 10.1  --  9.4  MG  --   --  2.1  --    Liver Function Tests:  Recent Labs Lab 02/15/16 1555  AST 19  ALT 18  ALKPHOS 45  BILITOT 0.6  PROT 6.5  ALBUMIN 3.8   No results for input(s): LIPASE, AMYLASE in the last 168 hours. No results for input(s): AMMONIA in the last 168 hours. CBC:  Recent Labs Lab 02/15/16 0547 02/15/16 1555 02/16/16 0338 02/17/16 0705  WBC 9.5 12.6* 10.4 11.2*  NEUTROABS  --  8.4*  --   --   HGB 11.9* 12.3* 11.2* 11.6*  HCT 35.9* 37.5* 35.0* 35.9*  MCV 85.7 86.6 86.2 86.5  PLT 135* 157 143* 153   Cardiac  Enzymes:  Recent Labs Lab 02/15/16 1555 02/15/16 2315 02/16/16 0338 02/16/16 1105  TROPONINI <0.03 <0.03 <0.03 <0.03   BNP: Invalid input(s): POCBNP CBG:  Recent Labs Lab 02/16/16 1130 02/16/16 1601 02/16/16 2052 02/17/16 0623 02/17/16 1117  GLUCAP 217* 153* 207* 161* 252*   D-Dimer No results for input(s): DDIMER in the last 72 hours. Hgb A1c No results for input(s): HGBA1C in the last 72 hours. Lipid Profile No results for input(s): CHOL, HDL, LDLCALC, TRIG, CHOLHDL, LDLDIRECT in the last 72 hours. Thyroid function studies No results for input(s): TSH, T4TOTAL, T3FREE, THYROIDAB in the last 72 hours.  Invalid input(s): FREET3 Anemia work up No results for input(s): VITAMINB12, FOLATE, FERRITIN, TIBC, IRON, RETICCTPCT in the last 72 hours. Urinalysis    Component Value Date/Time   COLORURINE YELLOW 02/16/2016 0203   APPEARANCEUR CLEAR 02/16/2016 0203   LABSPEC 1.039 (H) 02/16/2016 0203   PHURINE 6.0 02/16/2016 0203   GLUCOSEU 500 (A) 02/16/2016 0203   HGBUR NEGATIVE 02/16/2016 0203   BILIRUBINUR NEGATIVE 02/16/2016 0203   KETONESUR NEGATIVE 02/16/2016 0203   PROTEINUR 30 (A) 02/16/2016 0203   UROBILINOGEN 0.2 09/19/2010 1045   NITRITE NEGATIVE 02/16/2016 0203   LEUKOCYTESUR NEGATIVE 02/16/2016 0203   Sepsis Labs Invalid input(s): PROCALCITONIN,  WBC,  LACTICIDVEN Microbiology No results found for this or any previous visit (from the past 240 hour(s)).   Time coordinating discharge: Over 30 minutes  SIGNED:   Elmarie Shiley, MD  [facility] 02/17/2016, 2:24 PM Pager [phone]  If 7PM-7AM, please contact night-coverage [email or web address] Password [email or web address]

## 2016-02-17 NOTE — Progress Notes (Signed)
Patient Name: Robert Burns Date of Encounter: 02/17/2016  Primary Cardiologist: [facility] Problem List     Principal Problem:   Hypotension Active Problems:   Diabetes mellitus ([facility])   Essential hypertension   Hyperlipidemia   OSA on CPAP   PVD (peripheral vascular disease) ([facility])   Leukocytosis   Nausea vomiting and diarrhea     Subjective   75 y.o. male with medical history significant of HTN, HLD, CAD s/p stent/CABG, s/p PM for SSS, PVD, OSA on CPAP; who presents for low blood pressures  Has a moderte left groin hematoma.   Inpatient Medications    Scheduled Meds: . cholecalciferol  1,000 Units Oral BID  . gabapentin  300 mg Oral QHS  . insulin aspart  0-5 Units Subcutaneous QHS  . insulin aspart  0-9 Units Subcutaneous TID WC  . insulin detemir  5 Units Subcutaneous QHS  . isosorbide mononitrate  30 mg Oral Daily  . metoprolol  75 mg Oral BID  . pravastatin  40 mg Oral q1800  . tamsulosin  0.4 mg Oral BID   Continuous Infusions: . sodium chloride 75 mL/hr at 02/16/16 2226   PRN Meds: acetaminophen **OR** acetaminophen, albuterol, diphenhydrAMINE, nitroGLYCERIN, ondansetron **OR** ondansetron (ZOFRAN) IV   Vital Signs    Vitals:   02/16/16 1300 02/16/16 1928 02/17/16 0518 02/17/16 0700  BP: (!) 150/62 (!) 157/58 (!) 161/59 (!) 184/49  Pulse: 62 67 60 62  Resp:  20 20 20   Temp: 98.2 F (36.8 C) 98.3 F (36.8 C) 98.5 F (36.9 C) 98.3 F (36.8 C)  TempSrc: Oral Oral Oral Oral  SpO2: 99% 97% 97% 99%  Weight:      Height:        Intake/Output Summary (Last 24 hours) at 02/17/16 0807 Last data filed at 02/16/16 1630  Gross per 24 hour  Intake              600 ml  Output                0 ml  Net              600 ml   Filed Weights   02/15/16 2319  Weight: 175 lb (79.4 kg)    Physical Exam    GEN: Well nourished, well developed, in no acute distress.  HEENT: Grossly normal.  Neck: Supple, no JVD, carotid bruits, or  masses. Cardiac: RRR, no murmurs, rubs, or gallops. No clubbing, cyanosis, edema.  Radials/DP/PT 2+ and equal bilaterally.  Respiratory:  Respirations regular and unlabored, clear to auscultation bilaterally. GI: Soft, nontender, nondistended, BS + x 4. MS: moderate left femoral cath site hematoma.   Appears stable.  Did not hear a bruit today .   Not pulsatile.  Skin: warm and dry, no rash. Neuro:  Strength and sensation are intact. Psych: AAOx3.  Normal affect.  Labs    CBC  Recent Labs  02/15/16 1555 02/16/16 0338 02/17/16 0705  WBC 12.6* 10.4 11.2*  NEUTROABS 8.4*  --   --   HGB 12.3* 11.2* 11.6*  HCT 37.5* 35.0* 35.9*  MCV 86.6 86.2 86.5  PLT 157 143* 0000000   Basic Metabolic Panel  Recent Labs  02/15/16 1555 02/15/16 2315 02/16/16 0338  NA 143  --  140  K 4.3  --  3.5  CL 109  --  107  CO2 27  --  24  GLUCOSE 150*  --  167*  BUN 18  --  14  CREATININE 1.16  --  0.72  CALCIUM 10.1  --  9.4  MG  --  2.1  --    Liver Function Tests  Recent Labs  02/15/16 1555  AST 19  ALT 18  ALKPHOS 45  BILITOT 0.6  PROT 6.5  ALBUMIN 3.8   No results for input(s): LIPASE, AMYLASE in the last 72 hours. Cardiac Enzymes  Recent Labs  02/15/16 2315 02/16/16 0338 02/16/16 1105  TROPONINI <0.03 <0.03 <0.03   BNP Invalid input(s): POCBNP D-Dimer No results for input(s): DDIMER in the last 72 hours. Hemoglobin A1C No results for input(s): HGBA1C in the last 72 hours. Fasting Lipid Panel No results for input(s): CHOL, HDL, LDLCALC, TRIG, CHOLHDL, LDLDIRECT in the last 72 hours. Thyroid Function Tests No results for input(s): TSH, T4TOTAL, T3FREE, THYROIDAB in the last 72 hours.  Invalid input(s): FREET3  Telemetry    NSR  - Personally Reviewed  ECG    NSR  - Personally Reviewed  Radiology    Ct Abdomen Pelvis W Contrast  Result Date: 02/15/2016 CLINICAL DATA:  Weakness lightheadedness hypotension (bruising and femoral bruising evaluate for bleeding  EXAM: CT ABDOMEN AND PELVIS WITH CONTRAST TECHNIQUE: Multidetector CT imaging of the abdomen and pelvis was performed using the standard protocol following bolus administration of intravenous contrast. CONTRAST:  100 mL Isovue 300 intravenous COMPARISON:  08/03/2010 FINDINGS: Lower chest: Visualized lung bases show no acute consolidation or pleural effusion. Partially visualized pacer leads. Coronary artery calcifications. Heart size is mildly enlarged. No pericardial effusion. Hepatobiliary: No focal hepatic abnormality. The gallbladder is contracted. No biliary dilatation. Pancreas: Unremarkable. No pancreatic ductal dilatation or surrounding inflammatory changes. Spleen: Normal in size without focal abnormality. Adrenals/Urinary Tract: Adrenal glands within normal limits. Multiple sub cm hypodense lesions within both kidneys too small to further characterize but probably cysts. There is a large 7 cm cyst within the anterior lower pole of left kidney. The bladder is unremarkable. Stomach/Bowel: There is no dilated small bowel to suggest an obstruction. The stomach is grossly unremarkable. There is diverticular disease of the colon without acute bowel inflammation. Vascular/Lymphatic: Dense atherosclerosis a calcifications of the aorta. No aneurysm. Mild nonspecific sub cm retroperitoneal nodes. Small upper abdominal nonspecific sub cm lymph nodes, not significantly changed. Left inguinal adenopathy Reproductive: Prostate gland is enlarged and exerts mass effect on the posterior bladder. There are coarse calcifications. Other: No free air or free fluid. Musculoskeletal: Moderate skin thickening and soft tissue edema/subcutaneous soft tissue stranding over the left lower anterior abdominal wall and lateral abdominal wall. There is edema and soft tissue stranding within the left groin. There is no evidence for retroperitoneal hematoma. There is no active extravasation at the left groin. Pelvic lipoma are unchanged.  Spine demonstrates marked degenerative changes with large posterior disc osteophyte complex at L5-S1. IMPRESSION: 1. Moderate soft tissue stranding and edema involving the left lower anterior and lateral abdominal wall, and extending into the left groin region, findings could relate to trauma or possibly an infection. There is no evidence for retroperitoneal hematoma or active extravasation. 2. Multiple enlarged left inguinal lymph nodes. 3. Probable bilateral renal cysts with 7 cm cyst in the left kidney. 4. Sigmoid colon diverticular disease without acute inflammation 5. Enlarged prostate gland exerts mass effect on the posterior bladder Electronically Signed   By: Donavan Foil M.D.   On: 02/15/2016 18:00    Cardiac Studies     Patient Profile     75 y.o. male with medical history  significant of HTN, HLD, CAD s/p stent/CABG, s/p PM for SSS, PVD, OSA on CPAP; who presents for low blood pressures   Assessment & Plan    1. Hypotension:   BP is now elevated after BP meds were helped. Will restart amlodipine 10 mg a day , losartan - HCT,  Will add Kdur 20 a da I suspect the hypotension was vasovagal  He should be able to be discharged later today  2. Left groin hematoma Getting a vascular study .   Can go home if hematoma is stable     Signed, Mertie Moores, MD  02/17/2016, 8:07 AM

## 2016-02-17 NOTE — Progress Notes (Signed)
VASCULAR LAB PRELIMINARY  PRELIMINARY  PRELIMINARY  PRELIMINARY  Left groin ultrasound completed.    Preliminary report:  There is no obvious evidence of pseudoaneurysm or AV Fistula noted in the left groin.  There is a small hematoma noted in the left groin.   Trezure Cronk, RVT 02/17/2016, 9:38 AM

## 2016-02-17 NOTE — Progress Notes (Signed)
Pt ride here, pt d/c'd via wheelchair with belongings, escorted by hospital volunteer.

## 2016-02-17 NOTE — Progress Notes (Signed)
D/c instructions reviewed with pt, copy of instructions and script given to pt. Pt waiting for ride to arrive to pick him up.

## 2016-02-17 NOTE — Progress Notes (Signed)
Spoke with Madelin Rear inquiring if pt needed cardiology appointment moved up sooner (medical Dr Tyrell Antonio requested checking with cards to see if they need pt's appointment moved up).  PA states will leave appointment time as is, no change.

## 2016-02-24 ENCOUNTER — Ambulatory Visit (HOSPITAL_COMMUNITY)
Admission: RE | Admit: 2016-02-24 | Discharge: 2016-02-24 | Disposition: A | Discharge status: Home / Self Care | Payer: Medicare HMO | Source: Ambulatory Visit | Attending: Cardiology | Admitting: Cardiology

## 2016-02-24 DIAGNOSIS — I739 Peripheral vascular disease, unspecified: Secondary | ICD-10-CM

## 2016-02-25 ENCOUNTER — Encounter (HOSPITAL_COMMUNITY): Payer: Medicare HMO

## 2016-02-28 ENCOUNTER — Encounter: Payer: Self-pay | Admitting: Emergency Medicine

## 2016-02-28 ENCOUNTER — Emergency Department
Admission: EM | Admit: 2016-02-28 | Discharge: 2016-02-28 | Payer: Medicare HMO | Source: Home / Self Care | Attending: Family Medicine | Admitting: Family Medicine

## 2016-02-28 ENCOUNTER — Encounter (HOSPITAL_COMMUNITY): Payer: Self-pay

## 2016-02-28 ENCOUNTER — Emergency Department (HOSPITAL_COMMUNITY)
Admission: EM | Admit: 2016-02-28 | Discharge: 2016-02-28 | Disposition: A | Discharge status: Home / Self Care | Payer: Medicare HMO | Attending: Emergency Medicine | Admitting: Emergency Medicine

## 2016-02-28 ENCOUNTER — Emergency Department (HOSPITAL_COMMUNITY): Payer: Medicare HMO

## 2016-02-28 DIAGNOSIS — Z7982 Long term (current) use of aspirin: Secondary | ICD-10-CM | POA: Diagnosis not present

## 2016-02-28 DIAGNOSIS — Z950 Presence of cardiac pacemaker: Secondary | ICD-10-CM | POA: Diagnosis not present

## 2016-02-28 DIAGNOSIS — Z87891 Personal history of nicotine dependence: Secondary | ICD-10-CM | POA: Diagnosis not present

## 2016-02-28 DIAGNOSIS — Z951 Presence of aortocoronary bypass graft: Secondary | ICD-10-CM | POA: Insufficient documentation

## 2016-02-28 DIAGNOSIS — Z91010 Allergy to peanuts: Secondary | ICD-10-CM | POA: Diagnosis not present

## 2016-02-28 DIAGNOSIS — J069 Acute upper respiratory infection, unspecified: Secondary | ICD-10-CM | POA: Diagnosis not present

## 2016-02-28 DIAGNOSIS — Z85828 Personal history of other malignant neoplasm of skin: Secondary | ICD-10-CM | POA: Diagnosis not present

## 2016-02-28 DIAGNOSIS — I2510 Atherosclerotic heart disease of native coronary artery without angina pectoris: Secondary | ICD-10-CM | POA: Diagnosis not present

## 2016-02-28 DIAGNOSIS — I959 Hypotension, unspecified: Secondary | ICD-10-CM

## 2016-02-28 DIAGNOSIS — I951 Orthostatic hypotension: Secondary | ICD-10-CM | POA: Insufficient documentation

## 2016-02-28 DIAGNOSIS — E119 Type 2 diabetes mellitus without complications: Secondary | ICD-10-CM | POA: Diagnosis not present

## 2016-02-28 DIAGNOSIS — D649 Anemia, unspecified: Secondary | ICD-10-CM | POA: Diagnosis not present

## 2016-02-28 DIAGNOSIS — Z7984 Long term (current) use of oral hypoglycemic drugs: Secondary | ICD-10-CM | POA: Diagnosis not present

## 2016-02-28 DIAGNOSIS — B9789 Other viral agents as the cause of diseases classified elsewhere: Secondary | ICD-10-CM

## 2016-02-28 LAB — COMPREHENSIVE METABOLIC PANEL
ALBUMIN: 3.4 g/dL — AB (ref 3.5–5.0)
ALK PHOS: 54 U/L (ref 38–126)
ALT: 23 U/L (ref 17–63)
AST: 25 U/L (ref 15–41)
Anion gap: 11 (ref 5–15)
BILIRUBIN TOTAL: 0.3 mg/dL (ref 0.3–1.2)
BUN: 19 mg/dL (ref 6–20)
CO2: 21 mmol/L — ABNORMAL LOW (ref 22–32)
Calcium: 9.1 mg/dL (ref 8.9–10.3)
Chloride: 108 mmol/L (ref 101–111)
Creatinine, Ser: 1 mg/dL (ref 0.61–1.24)
GFR calc Af Amer: >60 mL/min (ref >60)
GFR calc non Af Amer: >60 mL/min (ref >60)
GLUCOSE: 152 mg/dL — AB (ref 65–99)
POTASSIUM: 4.1 mmol/L (ref 3.5–5.1)
Sodium: 140 mmol/L (ref 135–145)
TOTAL PROTEIN: 6.3 g/dL — AB (ref 6.5–8.1)

## 2016-02-28 LAB — CBC WITH DIFFERENTIAL/PLATELET
BASOS PCT: 0 %
Basophils Absolute: 0 10*3/uL (ref 0.0–0.1)
Eosinophils Absolute: 0 10*3/uL (ref 0.0–0.7)
Eosinophils Relative: 0 %
HEMATOCRIT: 38.4 % — AB (ref 39.0–52.0)
Hemoglobin: 12.2 g/dL — ABNORMAL LOW (ref 13.0–17.0)
LYMPHS PCT: 21 %
Lymphs Abs: 1.4 10*3/uL (ref 0.7–4.0)
MCH: 27.7 pg (ref 26.0–34.0)
MCHC: 31.8 g/dL (ref 30.0–36.0)
MCV: 87.3 fL (ref 78.0–100.0)
MONO ABS: 0.6 10*3/uL (ref 0.1–1.0)
MONOS PCT: 9 %
NEUTROS ABS: 4.7 10*3/uL (ref 1.7–7.7)
Neutrophils Relative %: 70 %
Platelets: 165 10*3/uL (ref 150–400)
RBC: 4.4 MIL/uL (ref 4.22–5.81)
RDW: 14.3 % (ref 11.5–15.5)
WBC: 6.8 10*3/uL (ref 4.0–10.5)

## 2016-02-28 LAB — TROPONIN I: Troponin I: <0.03 ng/mL (ref <0.03)

## 2016-02-28 MED ORDER — DEXAMETHASONE SODIUM PHOSPHATE 10 MG/ML IJ SOLN
10.0000 mg | Freq: Once | INTRAMUSCULAR | Status: AC
  Administered 2016-02-28: 10 mg via INTRAVENOUS
  Filled 2016-02-28: qty 1

## 2016-02-28 MED ORDER — SODIUM CHLORIDE 0.9 % IV BOLUS (SEPSIS)
1000.0000 mL | Freq: Once | INTRAVENOUS | Status: AC
  Administered 2016-02-28: 1000 mL via INTRAVENOUS

## 2016-02-28 NOTE — ED Triage Notes (Signed)
Pt arrives EMS from Urgent care with c/o weakness and flu symptoms over several day. Pt hypotensive at UC 90/40. Given 500 cc NS by EMS for BP of 130/70 on arrival.Diabetic  And cardiac cath last week.

## 2016-02-28 NOTE — ED Notes (Signed)
EKG given to Dr. Goldston  

## 2016-02-28 NOTE — Discharge Instructions (Signed)
Drink plenty of fluids.  You may use Afrin nasal spray, as long as you do not use it for more than three days. If you use it for more than that, it will make your symptoms worse.

## 2016-02-28 NOTE — ED Provider Notes (Signed)
[facility]    CSN: [id number] Arrival date & time: 02/28/16  1327     History   Chief Complaint Chief Complaint  Patient presents with  . URI    HPI Robert Burns is a 75 y.o. male.   Patient complains of five day history of typical cold-like symptoms developing over several days, including mild sore throat, sinus congestion, chest congestion, malaise, fatigue, and cough.  No fever. Two weeks ago he had undergone balloon angioplasty, and shortly after discharge had experienced a hypotensive episode requiring readmission.  He was observed overnight, and discharged in stable condition the next day.  His hypotension was thought to be of vaso-vagal etiology resulting from nausea/vomiting and several episodes of diarrhea prior to admission.   The history is provided by the patient.    Past Medical History:  Diagnosis Date  . Arthritis    "all over" (11/25/2015)  . CAD (coronary artery disease)    a. s/p CABG  06/2002; b. 07/18/11 nuc study: normal EF and negative for ischemia; c. 02/01/15 PCI: DES to prox SVG to PDA, staged PCI of SVG to Diag in 02/2015.  [name] Cancer (HCC)    Right Shoulder, Left Leg- BCC, SCC, AND MELANOMA  . Hyperlipidemia   . Hypertension   . Leg pain   . OSA on CPAP   . PAD (peripheral artery disease) ([place])    a. 10/2002 L SFA PTA/BMS; b. 8/17 LE Angio: LEIA 90 (9x40 self exp stent), LSFA short segment prox occlusion (staged PTA/stenting 01/03/2016), patent mid stent, RSFA 49m (staged PTA/DEB 02/14/2016).  . Presence of permanent cardiac pacemaker   . SSS (sick sinus syndrome) ([facility])    a. s/p PPM in 2007 with gen change 04/2015 - Medtronic Adapta ADDRL1, ser # [id number].  . Type II diabetes mellitus ([place])   . Urgency of urination     Patient Active Problem List   Diagnosis Date Noted  . Leukocytosis 02/16/2016  . Nausea vomiting and diarrhea 02/16/2016  . Hypotension 02/15/2016  . Peripheral arterial disease ([place])   . Claudication ([place])  11/25/2015  . Pacemaker battery depletion 04/23/2015  . 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 CAD S/P SVG-PDA DES 02/01/15, followed by staged SVG-Dx DES 03/01/15 03/15/2015  . Coronary artery disease due to lipid rich plaque 03/01/2015  . Positive cardiac stress test   . Ischemic chest pain ([place])   . Abnormal nuclear stress test   . Aftercare following surgery of the circulatory system, NEC 05/13/2013  . PVD (peripheral vascular disease) ([place]) 05/13/2013  . OSA on CPAP 11/04/2012  . CAD -CABG 2004 LIMA-LAD, VG-Dx, seq VG -RI & OM branch; VG-PDA    . SSS (sick sinus syndrome), medtronic adapta   . Hyperlipidemia   . Pain in lower limb 05/07/2012  . Superficial femoral artery injury 05/09/2011  . 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 SPRAIN&STRAIN OTHER SPECIFIED SITES KNEE&LEG 01/31/2010  . Diabetes mellitus ([place]) 11/26/2006  . Essential hypertension 11/26/2006    Past Surgical History:  Procedure Laterality Date  . CARDIAC CATHETERIZATION N/A 02/01/2015   Procedure: Left Heart Cath and Coronary Angiography;  Surgeon: Lorretta Harp, MD;  Location: [facility];  Service: Cardiovascular;  Laterality: N/A;  . CARDIAC CATHETERIZATION N/A 02/01/2015   Procedure: Coronary Stent Intervention;  Surgeon: Lorretta Harp, MD;  Location: [facility];  Service: Cardiovascular;  Laterality: N/A;  . CARDIAC CATHETERIZATION  06/2002   "just before bypass  OR"  . CARDIAC CATHETERIZATION N/A 03/01/2015   Procedure: Coronary Stent Intervention;  Surgeon: Lorretta Harp, MD;  Location: [facility];  Service: Cardiovascular;  Laterality: N/A;  . CORONARY ANGIOPLASTY    . CORONARY ARTERY BYPASS GRAFT  06/2002   x5, LIMA-LAD;VG- Diag; seq VG- ramus & OM branch; VG-PDA  . EP IMPLANTABLE DEVICE N/A 04/23/2015   Procedure: PPM Generator Changeout;  Surgeon: Sanda Klein, MD;  Location: [facility];  Service: Cardiovascular;   Laterality: N/A;  . FEMORAL ARTERY STENT Left ~ 2014   "taken out of my leg; couldn' catorgorize what kind so the put it under all 3"; cataroziepitheloid hemanioendotheliomau  . FOOT FRACTURE SURGERY Left 1973  . FRACTURE SURGERY    . INSERT / REPLACE / REMOVE PACEMAKER  10/13/05   right side, medtronic Adapta  . KNEE HARDWARE REMOVAL Right 1950's   "3-4 months after the insertion"  . KNEE SURGERY Right 1950's   "broke my lower leg; had to put pin in my knee to keep lower leg in place til it healed"  . PERIPHERAL VASCULAR CATHETERIZATION N/A 11/25/2015   Procedure: Lower Extremity Angiography;  Surgeon: Lorretta Harp, MD;  Location: [facility];  Service: Cardiovascular;  Laterality: N/A;  . PERIPHERAL VASCULAR CATHETERIZATION Left 11/25/2015   Procedure: Peripheral Vascular Intervention;  Surgeon: Lorretta Harp, MD;  Location: [facility];  Service: Cardiovascular;  Laterality: Left;  external iliac  . PERIPHERAL VASCULAR CATHETERIZATION N/A 01/03/2016   Procedure: Lower Extremity Angiography;  Surgeon: Lorretta Harp, MD;  Location: [facility];  Service: Cardiovascular;  Laterality: N/A;  . PERIPHERAL VASCULAR CATHETERIZATION Left 01/03/2016   Procedure: Peripheral Vascular Intervention;  Surgeon: Lorretta Harp, MD;  Location: [facility];  Service: Cardiovascular;  Laterality: Left;  SFA  . PERIPHERAL VASCULAR CATHETERIZATION Right 02/14/2016   Procedure: Peripheral Vascular Atherectomy;  Surgeon: Lorretta Harp, MD;  Location: [facility];  Service: Cardiovascular;  Laterality: Right;  SFA  . POPLITEAL ARTERY STENT  01/03/2016   Contralateral access with a 7 French crossover sheath (second order catheter placement)  . TONSILLECTOMY AND ADENOIDECTOMY    . TUMOR EXCISION Right ~ 2005   cancerous tumor removed from shoulder  . TUMOR EXCISION Right ~ 2000   benign tumor removed from under shoulder  . TUMOR EXCISION Left 06/02/2010   resection of  Lt SFA wth interposition of Gore-Tex graft       Home Medications    Prior to Admission medications   Medication Sig Start Date End Date Taking? Authorizing Provider  acetaminophen (TYLENOL) 500 MG tablet Take 500 mg by mouth every 6 (six) hours as needed for moderate pain or headache.     Historical Provider, MD  amLODipine (NORVASC) 10 MG tablet Take 10 mg by mouth daily.      Historical Provider, MD  amoxicillin (AMOXIL) 875 MG tablet Take 1 tablet (875 mg total) by mouth 2 (two) times daily. (Rx void after 03/08/16) 02/29/16   Kandra Nicolas, MD  aspirin EC 81 MG tablet Take 81 mg by mouth daily.      Historical Provider, MD  B Complex Vitamins (B COMPLEX-B12 PO) Take 1 tablet by mouth daily.     Historical Provider, MD  cholecalciferol (VITAMIN D) 1000 units tablet Take 1,000 Units by mouth 2 (two) times daily.     Historical Provider, MD  clopidogrel (PLAVIX) 75 MG tablet Take 1 tablet (  75 mg total) by mouth daily with breakfast. 02/02/15   Almyra Deforest, PA  Doxylamine Succinate, Sleep, (SLEEP AID PO) Take 1 tablet by mouth as needed (for sleep).     Historical Provider, MD  gabapentin (NEURONTIN) 100 MG capsule Take 300 mg by mouth at bedtime.  01/16/13   Historical Provider, MD  glipiZIDE (GLUCOTROL) 5 MG tablet Take 10 mg by mouth daily.     Historical Provider, MD  isosorbide mononitrate (IMDUR) 30 MG 24 hr tablet Take 1 tablet (30 mg total) by mouth daily. 02/02/15   Almyra Deforest, PA  losartan-hydrochlorothiazide (HYZAAR) 100-25 MG per tablet Take 1 tablet by mouth daily.      Historical Provider, MD  lovastatin (MEVACOR) 40 MG tablet Take 40 mg by mouth at bedtime.      Historical Provider, MD  magnesium hydroxide (MILK OF MAGNESIA) 400 MG/5ML suspension Take 30 mLs by mouth as needed for moderate constipation or indigestion.  03/07/14   Historical Provider, MD  metFORMIN (GLUCOPHAGE) 1000 MG tablet Take 1,000 mg by mouth 2 (two) times daily with a meal.      Historical Provider, MD    metoprolol tartrate (LOPRESSOR) 50 MG tablet Take 1 tablet (50 mg total) by mouth 2 (two) times daily. 01/04/16   Arbutus Leas, NP  Multiple Vitamin (MULTIVITAMIN) capsule Take 1 capsule by mouth daily.      Historical Provider, MD  nitroGLYCERIN (NITROSTAT) 0.4 MG SL tablet Place 1 tablet (0.4 mg total) under the tongue every 5 (five) minutes as needed for chest pain. 02/02/15   Almyra Deforest, PA  Omega-3 Fatty Acids (FISH OIL PO) Take 1 tablet by mouth daily.    Historical Provider, MD  potassium chloride SA (K-DUR,KLOR-CON) 20 MEQ tablet Take 1 tablet (20 mEq total) by mouth daily. 02/18/16   Belkys A Regalado, MD  tamsulosin (FLOMAX) 0.4 MG CAPS capsule Take 0.4 mg by mouth 2 (two) times daily.  03/09/15   Historical Provider, MD  vitamin C (ASCORBIC ACID) 500 MG tablet Take 500 mg by mouth 2 (two) times daily.      Historical Provider, MD    Family History Family History  Problem Relation Age of Onset  . Coronary artery disease Mother   . Heart attack Mother   . Hypertension Mother   . Heart disease Mother     Open  Heart surgery  . Diabetes Father   . Heart disease Father   . Hyperlipidemia Father   . Heart attack Father   . Hypertension Sister   . Diabetes Sister   . Heart disease Sister     Social History Social History  Substance Use Topics  . Smoking status: Former Smoker    Years: 10.00    Types: Pipe    Quit date: 4  . Smokeless tobacco: Never Used     Comment: "quit smoking in 1973"  . Alcohol use No     Allergies   Peanut-containing drug products; Ace inhibitors; Clonidine hcl; Coreg [carvedilol]; Diltiazem hcl; Mobic [meloxicam]; and Ramipril   Review of Systems Review of Systems + sore throat + cough No pleuritic pain No chest pain No wheezing + nasal congestion + post-nasal drainage No sinus pain/pressure No itchy/red eyes No earache + dizzy No hemoptysis No SOB No fever, + chills + nausea No vomiting No abdominal pain No diarrhea No  urinary symptoms No skin rash + fatigue + myalgias No headache Used OTC meds without relief   Physical Exam Triage Vital  Signs ED Triage Vitals  Enc Vitals Group     BP 02/28/16 1350 108/56     Pulse Rate 02/28/16 1350 94     Resp --      Temp 02/28/16 1350 97.5 F (36.4 C)     Temp Source 02/28/16 1350 Oral     SpO2 02/28/16 1350 99 %     Weight 02/28/16 1351 174 lb (78.9 kg)     Height 02/28/16 1351 5\' 8"  (1.727 m)     Head Circumference --      Peak Flow --      Pain Score 02/28/16 1447 0     Pain Loc --      Pain Edu? --      Excl. in [place]? --    No data found.   Updated Vital Signs BP (!) 89/50 (BP Location: Right Arm)   Pulse 94   Temp 97.5 F (36.4 C) (Oral)   Ht 5\' 8"  (1.727 m)   Wt 174 lb (78.9 kg)   SpO2 99%   BMI 26.46 kg/m   Visual Acuity Right Eye Distance:   Left Eye Distance:   Bilateral Distance:    Right Eye Near:   Left Eye Near:    Bilateral Near:     Physical Exam Nursing notes and Vital Signs reviewed. Appearance:  Patient appears pale upon initial contact, but in no acute distress.  However, during interview and exam he becomes increasingly pale and complains of dizziness, nausea, and need to have a bowel movement. Eyes:  Pupils are equal, round, and reactive to light and accomodation.  Extraocular movement is intact.  Conjunctivae are not inflamed  Ears:  Canals normal.  Tympanic membranes normal.  Nose:  Mildly congested turbinates.  No sinus tenderness.   Pharynx:  Normal; moist mucous membranes  Neck:  Supple.   Slightly enlarged posterior/lateral nodes are palpated bilaterally  Lungs:  Clear to auscultation.  Breath sounds are equal.  Moving air well. Heart:  Regular rate and rhythm without murmurs, rubs, or gallops.  Abdomen:  Nontender without masses or hepatosplenomegaly.  Bowel sounds are present.  No CVA or flank tenderness.  Extremities:  No edema.  Skin:  No rash present.    UC Treatments / Results  Labs (all labs  ordered are listed, but only abnormal results are displayed) Labs Reviewed - No data to display  EKG  EKG Interpretation None       Radiology No results found.  Procedures Procedures (including critical care time)  Medications Ordered in UC Medications - No data to display   Initial Impression / Assessment and Plan / UC Course  I have reviewed the triage vital signs and the nursing notes.  Pertinent labs & imaging results that were available during my care of the patient were reviewed by me and considered in my medical decision making (see chart for details).  Clinical Course   Patient presents today with URI symptoms. During exam patient became acutely hypotensive after having large loose bowel movement.  Suspect vasovagal episode. Note similar episode 02/15/16 after being discharged from [facility] where balloon angioplasty had been performed.  Later that day after discharge he reported feeling dizzy and developed nausea/vomiting, and several episodes of loose bowel movements resulting in blood pressure as low as 83/50.  He was readmitted to hospital via ED and resuscitated with IV fluids.  Subsequent evaluation was negative and his hypotension was felt to be of vaso-vagal etiology.  Patient transported  to [facility] ED via rescue squad.  He is alert but hypotensive with vital signs otherwise stable.    Final Clinical Impressions(s) / UC Diagnoses   Final diagnoses:  Hypotension, unspecified hypotension type  Viral URI with cough    New Prescriptions Discharge Medication List as of 02/28/2016  2:47 PM       Kandra Nicolas, MD 03/02/16 1359

## 2016-02-28 NOTE — ED Notes (Signed)
Pt ambulated in hallway without difficulty

## 2016-02-28 NOTE — ED Notes (Signed)
PCXR at bedside.

## 2016-02-28 NOTE — ED Notes (Addendum)
Attempted IV insertion in his RT anti cubital and his RT forearm. Stopped immediatly due to infiltration. Charna Archer, LPN

## 2016-02-28 NOTE — ED Notes (Addendum)
Pt stated that during ortho vital signs that when sitting and standing became a little dizzy.

## 2016-02-28 NOTE — ED Triage Notes (Signed)
Watery eyes, congestion, runny nose, dry cough, general malaise x 5 days

## 2016-02-28 NOTE — ED Provider Notes (Signed)
[facility] DEPT Provider Note   CSN: [id number] Arrival date & time: 02/28/16  1540     History   Chief Complaint Chief Complaint  Patient presents with  . Weakness    HPI Robert Burns is a 75 y.o. male.  He was transferred here from urgent care center where he was noted to be hypotensive. He went there because of cold symptoms for the last 4 days. He has had clear rhinorrhea, minimal cough, slight sore throat with associated chills and sweats with no fever. He has had myalgias. He denies any sick contacts. He has had influenza immunization this year. He has noted some lightheadedness which got worse. He had a bowel movement in the urgent care prior to his blood pressure being noted to be low, but he states that he was actually getting more dizzy and lightheaded prior to the bowel movement. He denies chest pain, heaviness, tightness, pressure. There is been no nausea or vomiting.   The history is provided by the patient.  Weakness     Past Medical History:  Diagnosis Date  . Arthritis    "all over" (11/25/2015)  . CAD (coronary artery disease)    a. s/p CABG  06/2002; b. 07/18/11 nuc study: normal EF and negative for ischemia; c. 02/01/15 PCI: DES to prox SVG to PDA, staged PCI of SVG to Diag in 02/2015.  [name] Cancer (HCC)    Right Shoulder, Left Leg- BCC, SCC, AND MELANOMA  . Hyperlipidemia   . Hypertension   . Leg pain   . OSA on CPAP   . PAD (peripheral artery disease) ([place])    a. 10/2002 L SFA PTA/BMS; b. 8/17 LE Angio: LEIA 90 (9x40 self exp stent), LSFA short segment prox occlusion (staged PTA/stenting 01/03/2016), patent mid stent, RSFA 63m (staged PTA/DEB 02/14/2016).  . Presence of permanent cardiac pacemaker   . SSS (sick sinus syndrome) ([place])    a. s/p PPM in 2007 with gen change 04/2015 - Medtronic Adapta ADDRL1, ser # [id number].  . Type II diabetes mellitus ([place])   . Urgency of urination     Patient Active Problem List   Diagnosis Date Noted  . Leukocytosis  02/16/2016  . Nausea vomiting and diarrhea 02/16/2016  . Hypotension 02/15/2016  . Peripheral arterial disease ([place])   . Claudication ([place]) 11/25/2015  . Pacemaker battery depletion 04/23/2015  . 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 CAD S/P SVG-PDA DES 02/01/15, followed by staged SVG-Dx DES 03/01/15 03/15/2015  . Coronary artery disease due to lipid rich plaque 03/01/2015  . Positive cardiac stress test   . Ischemic chest pain ([place])   . Abnormal nuclear stress test   . Aftercare following surgery of the circulatory system, NEC 05/13/2013  . PVD (peripheral vascular disease) ([place]) 05/13/2013  . OSA on CPAP 11/04/2012  . CAD -CABG 2004 LIMA-LAD, VG-Dx, seq VG -RI & OM branch; VG-PDA    . SSS (sick sinus syndrome), medtronic adapta   . Hyperlipidemia   . Pain in lower limb 05/07/2012  . Superficial femoral artery injury 05/09/2011  . 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 SPRAIN&STRAIN OTHER SPECIFIED SITES KNEE&LEG 01/31/2010  . Diabetes mellitus ([place]) 11/26/2006  . Essential hypertension 11/26/2006    Past Surgical History:  Procedure Laterality Date  . CARDIAC CATHETERIZATION N/A 02/01/2015   Procedure: Left Heart Cath and Coronary Angiography;  Surgeon: Lorretta Harp, MD;  Location: [facility];  Service: Cardiovascular;  Laterality: N/A;  .  CARDIAC CATHETERIZATION N/A 02/01/2015   Procedure: Coronary Stent Intervention;  Surgeon: Lorretta Harp, MD;  Location: [facility];  Service: Cardiovascular;  Laterality: N/A;  . CARDIAC CATHETERIZATION  06/2002   "just before bypass OR"  . CARDIAC CATHETERIZATION N/A 03/01/2015   Procedure: Coronary Stent Intervention;  Surgeon: Lorretta Harp, MD;  Location: [facility];  Service: Cardiovascular;  Laterality: N/A;  . CORONARY ANGIOPLASTY    . CORONARY ARTERY BYPASS GRAFT  06/2002   x5, LIMA-LAD;VG- Diag; seq VG- ramus & OM branch; VG-PDA  . EP IMPLANTABLE  DEVICE N/A 04/23/2015   Procedure: PPM Generator Changeout;  Surgeon: Sanda Klein, MD;  Location: [facility];  Service: Cardiovascular;  Laterality: N/A;  . FEMORAL ARTERY STENT Left ~ 2014   "taken out of my leg; couldn' catorgorize what kind so the put it under all 3"; cataroziepitheloid hemanioendotheliomau  . FOOT FRACTURE SURGERY Left 1973  . FRACTURE SURGERY    . INSERT / REPLACE / REMOVE PACEMAKER  10/13/05   right side, medtronic Adapta  . KNEE HARDWARE REMOVAL Right 1950's   "3-4 months after the insertion"  . KNEE SURGERY Right 1950's   "broke my lower leg; had to put pin in my knee to keep lower leg in place til it healed"  . PERIPHERAL VASCULAR CATHETERIZATION N/A 11/25/2015   Procedure: Lower Extremity Angiography;  Surgeon: Lorretta Harp, MD;  Location: [facility];  Service: Cardiovascular;  Laterality: N/A;  . PERIPHERAL VASCULAR CATHETERIZATION Left 11/25/2015   Procedure: Peripheral Vascular Intervention;  Surgeon: Lorretta Harp, MD;  Location: [facility];  Service: Cardiovascular;  Laterality: Left;  external iliac  . PERIPHERAL VASCULAR CATHETERIZATION N/A 01/03/2016   Procedure: Lower Extremity Angiography;  Surgeon: Lorretta Harp, MD;  Location: [facility];  Service: Cardiovascular;  Laterality: N/A;  . PERIPHERAL VASCULAR CATHETERIZATION Left 01/03/2016   Procedure: Peripheral Vascular Intervention;  Surgeon: Lorretta Harp, MD;  Location: [facility];  Service: Cardiovascular;  Laterality: Left;  SFA  . PERIPHERAL VASCULAR CATHETERIZATION Right 02/14/2016   Procedure: Peripheral Vascular Atherectomy;  Surgeon: Lorretta Harp, MD;  Location: [facility];  Service: Cardiovascular;  Laterality: Right;  SFA  . POPLITEAL ARTERY STENT  01/03/2016   Contralateral access with a 7 French crossover sheath (second order catheter placement)  . TONSILLECTOMY AND ADENOIDECTOMY    . TUMOR EXCISION Right ~ 2005   cancerous tumor  removed from shoulder  . TUMOR EXCISION Right ~ 2000   benign tumor removed from under shoulder  . TUMOR EXCISION Left 06/02/2010   resection of Lt SFA wth interposition of Gore-Tex graft       Home Medications    Prior to Admission medications   Medication Sig Start Date End Date Taking? Authorizing Provider  acetaminophen (TYLENOL) 500 MG tablet Take 500 mg by mouth every 6 (six) hours as needed for moderate pain or headache.     Historical Provider, MD  amLODipine (NORVASC) 10 MG tablet Take 10 mg by mouth daily.      Historical Provider, MD  aspirin EC 81 MG tablet Take 81 mg by mouth daily.      Historical Provider, MD  B Complex Vitamins (B COMPLEX-B12 PO) Take 1 tablet by mouth daily.     Historical Provider, MD  cholecalciferol (VITAMIN D) 1000 units tablet Take 1,000 Units by mouth 2 (two) times daily.     Historical Provider,  MD  clopidogrel (PLAVIX) 75 MG tablet Take 1 tablet (75 mg total) by mouth daily with breakfast. 02/02/15   Almyra Deforest, PA  Doxylamine Succinate, Sleep, (SLEEP AID PO) Take 1 tablet by mouth as needed (for sleep).     Historical Provider, MD  gabapentin (NEURONTIN) 100 MG capsule Take 300 mg by mouth at bedtime.  01/16/13   Historical Provider, MD  glipiZIDE (GLUCOTROL) 5 MG tablet Take 5 mg by mouth daily.      Historical Provider, MD  isosorbide mononitrate (IMDUR) 30 MG 24 hr tablet Take 1 tablet (30 mg total) by mouth daily. 02/02/15   Almyra Deforest, PA  losartan-hydrochlorothiazide (HYZAAR) 100-25 MG per tablet Take 1 tablet by mouth daily.      Historical Provider, MD  lovastatin (MEVACOR) 40 MG tablet Take 40 mg by mouth at bedtime.      Historical Provider, MD  magnesium hydroxide (MILK OF MAGNESIA) 400 MG/5ML suspension Take 30 mLs by mouth as needed for moderate constipation or indigestion.  03/07/14   Historical Provider, MD  metFORMIN (GLUCOPHAGE) 1000 MG tablet Take 1,000 mg by mouth 2 (two) times daily with a meal.      Historical Provider, MD    metoprolol tartrate (LOPRESSOR) 50 MG tablet Take 1 tablet (50 mg total) by mouth 2 (two) times daily. 01/04/16   Arbutus Leas, NP  Multiple Vitamin (MULTIVITAMIN) capsule Take 1 capsule by mouth daily.      Historical Provider, MD  nitroGLYCERIN (NITROSTAT) 0.4 MG SL tablet Place 1 tablet (0.4 mg total) under the tongue every 5 (five) minutes as needed for chest pain. 02/02/15   Almyra Deforest, PA  Omega-3 Fatty Acids (FISH OIL PO) Take 1 tablet by mouth daily.    Historical Provider, MD  potassium chloride SA (K-DUR,KLOR-CON) 20 MEQ tablet Take 1 tablet (20 mEq total) by mouth daily. 02/18/16   Belkys A Regalado, MD  tamsulosin (FLOMAX) 0.4 MG CAPS capsule Take 0.4 mg by mouth 2 (two) times daily.  03/09/15   Historical Provider, MD  vitamin C (ASCORBIC ACID) 500 MG tablet Take 500 mg by mouth 2 (two) times daily.      Historical Provider, MD    Family History Family History  Problem Relation Age of Onset  . Coronary artery disease Mother   . Heart attack Mother   . Hypertension Mother   . Heart disease Mother     Open  Heart surgery  . Diabetes Father   . Heart disease Father   . Hyperlipidemia Father   . Heart attack Father   . Hypertension Sister   . Diabetes Sister   . Heart disease Sister     Social History Social History  Substance Use Topics  . Smoking status: Former Smoker    Years: 10.00    Types: Pipe    Quit date: 28  . Smokeless tobacco: Never Used     Comment: "quit smoking in 1973"  . Alcohol use No     Allergies   Peanut-containing drug products; Ace inhibitors; Clonidine hcl; Coreg [carvedilol]; Diltiazem hcl; Mobic [meloxicam]; and Ramipril   Review of Systems Review of Systems  Neurological: Positive for weakness.  All other systems reviewed and are negative.    Physical Exam Updated Vital Signs BP (!) 117/50 (BP Location: Right Arm)   Pulse 60   Temp 98.2 F (36.8 C) (Oral)   Resp 18   SpO2 98% Comment: ra  Physical Exam  Nursing note and  vitals  reviewed.  75 year old male, resting comfortably and in no acute distress. Vital signs are normal. Oxygen saturation is 98%, which is normal. Head is normocephalic and atraumatic. PERRLA, EOMI. Oropharynx is clear. No sinus tenderness. No nasal drainage or edema of turbinates. Neck is nontender and supple without adenopathy or JVD. Back is nontender and there is no CVA tenderness. Lungs are clear without rales, wheezes, or rhonchi. Chest is nontender. Heart has regular rate and rhythm without murmur. Abdomen is soft, flat, nontender without masses or hepatosplenomegaly and peristalsis is normoactive. Extremities have no cyanosis or edema, full range of motion is present. Skin is warm and dry without rash. Neurologic: Mental status is normal, cranial nerves are intact, there are no motor or sensory deficits.  ED Treatments / Results  Labs (all labs ordered are listed, but only abnormal results are displayed) Labs Reviewed  COMPREHENSIVE METABOLIC PANEL - Abnormal; Notable for the following:       Result Value   CO2 21 (*)    Glucose, Bld 152 (*)    Total Protein 6.3 (*)    Albumin 3.4 (*)    All other components within normal limits  CBC WITH DIFFERENTIAL/PLATELET - Abnormal; Notable for the following:    Hemoglobin 12.2 (*)    HCT 38.4 (*)    All other components within normal limits  TROPONIN I    EKG  EKG Interpretation  Date/Time:  Monday February 28 2016 16:05:17 EST Ventricular Rate:  63 PR Interval:    QRS Duration: 112 QT Interval:  426 QTC Calculation: 437 R Axis:   92 Text Interpretation:  Sinus rhythm Anterior infarct, old Minimal ST depression, lateral leads When compared with ECG of 02/15/2016, atrial-paced complexes are no longer Present Premature ventricular complexes are no longer Present Confirmed by Roxanne Mins  MD, Khyrie Masi ([id number]) on 02/28/2016 4:09:36 PM       Radiology Dg Chest Port 1 View  Result Date: 02/28/2016 CLINICAL DATA:  Cough.   Hypotension.  Weakness. EXAM: PORTABLE CHEST 1 VIEW COMPARISON:  12/20/2015 chest radiograph. FINDINGS: Stable configuration of median sternotomy wires, CABG clips and 2 lead right subclavian pacemaker. Stable cardiomediastinal silhouette with normal heart size and aortic atherosclerosis. No pneumothorax. No pleural effusion. Lungs appear clear, with no acute consolidative airspace disease and no pulmonary edema. IMPRESSION: No active cardiopulmonary disease. Aortic atherosclerosis. Electronically Signed   By: Ilona Sorrel M.D.   On: 02/28/2016 16:23    Procedures Procedures (including critical care time)  Medications Ordered in ED Medications  dexamethasone (DECADRON) injection 10 mg (not administered)  sodium chloride 0.9 % bolus 1,000 mL (0 mLs Intravenous Stopped 02/28/16 1930)     Initial Impression / Assessment and Plan / ED Course  I have reviewed the triage vital signs and the nursing notes.  Pertinent labs & imaging results that were available during my care of the patient were reviewed by me and considered in my medical decision making (see chart for details).  Clinical Course    Respiratory tract infection which may be a viral upper history infection, possible influenza. If it is influenza, he is beyond the treatment window for using antiviral medications. Blood pressure is adequate here-Will check orthostatic vital signs. Old records are reviewed and he does have a recent hospitalization for vasovagal hypotension.  Laboratory workup shows mild anemia which is unchanged from baseline. Orthostatic vital signs did show initial drop in blood pressure with subsequent rise. This may indicate some degree of dehydration. He is given IV  fluids. Following that, he was able to ambulate. He is still concerned about his respiratory tract infection. No indication of bacterial infection so antibiotics or not administered. He is given a single dose of dexamethasone and advised to use  over-the-counter medications such as Afrin with the warning that he may not use it for more than 3 days.  Final Clinical Impressions(s) / ED Diagnoses   Final diagnoses:  Orthostatic hypotension  Upper respiratory tract infection, unspecified type  Normochromic normocytic anemia    New Prescriptions New Prescriptions   No medications on file     Delora Fuel, MD [id number]

## 2016-02-28 NOTE — ED Notes (Signed)
Pt given po fluids and crackers. Tolerating well.

## 2016-02-29 ENCOUNTER — Emergency Department (INDEPENDENT_AMBULATORY_CARE_PROVIDER_SITE_OTHER)
Admission: EM | Admit: 2016-02-29 | Discharge: 2016-02-29 | Disposition: A | Discharge status: Home / Self Care | Payer: Medicare HMO | Source: Home / Self Care | Attending: Family Medicine | Admitting: Family Medicine

## 2016-02-29 ENCOUNTER — Encounter: Payer: Self-pay | Admitting: *Deleted

## 2016-02-29 DIAGNOSIS — B9789 Other viral agents as the cause of diseases classified elsewhere: Secondary | ICD-10-CM

## 2016-02-29 DIAGNOSIS — J069 Acute upper respiratory infection, unspecified: Secondary | ICD-10-CM | POA: Diagnosis not present

## 2016-02-29 MED ORDER — AMOXICILLIN 875 MG PO TABS: 875.0000 mg | ORAL_TABLET | Freq: Two times a day (BID) | ORAL | 0 refills | Status: DC

## 2016-02-29 NOTE — ED Provider Notes (Signed)
[name]    CSN: [id number] Arrival date & time: 02/29/16  1249     History   Chief Complaint Chief Complaint  Patient presents with  . Nasal Congestion    HPI Robert Burns is a 75 y.o. male.   Patient returns today with complaints of 5 day history of nasal congestion and sinus pressure.  He had presented to this clinic yesterday, but became acutely hypotensive after having loose stools and nausea, requiring EMS transport to ER.  He reports that his nausea and loose stools have resolved.   The history is provided by the patient.    Past Medical History:  Diagnosis Date  . Arthritis    "all over" (11/25/2015)  . CAD (coronary artery disease)    a. s/p CABG  06/2002; b. 07/18/11 nuc study: normal EF and negative for ischemia; c. 02/01/15 PCI: DES to prox SVG to PDA, staged PCI of SVG to Diag in 02/2015.  [name] Cancer (HCC)    Right Shoulder, Left Leg- BCC, SCC, AND MELANOMA  . Hyperlipidemia   . Hypertension   . Leg pain   . OSA on CPAP   . PAD (peripheral artery disease) ([place])    a. 10/2002 L SFA PTA/BMS; b. 8/17 LE Angio: LEIA 90 (9x40 self exp stent), LSFA short segment prox occlusion (staged PTA/stenting 01/03/2016), patent mid stent, RSFA 29m (staged PTA/DEB 02/14/2016).  . Presence of permanent cardiac pacemaker   . SSS (sick sinus syndrome) ([place])    a. s/p PPM in 2007 with gen change 04/2015 - Medtronic Adapta ADDRL1, ser # [id number].  . Type II diabetes mellitus ([place])   . Urgency of urination     Patient Active Problem List   Diagnosis Date Noted  . Leukocytosis 02/16/2016  . Nausea vomiting and diarrhea 02/16/2016  . Hypotension 02/15/2016  . Peripheral arterial disease ([place])   . Claudication ([place]) 11/25/2015  . Pacemaker battery depletion 04/23/2015  . 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 CAD S/P SVG-PDA DES 02/01/15, followed by staged SVG-Dx DES 03/01/15 03/15/2015  . Coronary artery disease due to lipid rich plaque 03/01/2015  . Positive cardiac  stress test   . Ischemic chest pain ([place])   . Abnormal nuclear stress test   . Aftercare following surgery of the circulatory system, NEC 05/13/2013  . PVD (peripheral vascular disease) ([place]) 05/13/2013  . OSA on CPAP 11/04/2012  . CAD -CABG 2004 LIMA-LAD, VG-Dx, seq VG -RI & OM branch; VG-PDA    . SSS (sick sinus syndrome), medtronic adapta   . Hyperlipidemia   . Pain in lower limb 05/07/2012  . Superficial femoral artery injury 05/09/2011  . 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 SPRAIN&STRAIN OTHER SPECIFIED SITES KNEE&LEG 01/31/2010  . Diabetes mellitus ([place]) 11/26/2006  . Essential hypertension 11/26/2006    Past Surgical History:  Procedure Laterality Date  . CARDIAC CATHETERIZATION N/A 02/01/2015   Procedure: Left Heart Cath and Coronary Angiography;  Surgeon: Lorretta Harp, MD;  Location: [facility];  Service: Cardiovascular;  Laterality: N/A;  . CARDIAC CATHETERIZATION N/A 02/01/2015   Procedure: Coronary Stent Intervention;  Surgeon: Lorretta Harp, MD;  Location: [facility];  Service: Cardiovascular;  Laterality: N/A;  . CARDIAC CATHETERIZATION  06/2002   "just before bypass OR"  . CARDIAC CATHETERIZATION N/A 03/01/2015   Procedure: Coronary Stent Intervention;  Surgeon: Lorretta Harp, MD;  Location: [facility];  Service: Cardiovascular;  Laterality: N/A;  .  CORONARY ANGIOPLASTY    . CORONARY ARTERY BYPASS GRAFT  06/2002   x5, LIMA-LAD;VG- Diag; seq VG- ramus & OM branch; VG-PDA  . EP IMPLANTABLE DEVICE N/A 04/23/2015   Procedure: PPM Generator Changeout;  Surgeon: Sanda Klein, MD;  Location: [facility];  Service: Cardiovascular;  Laterality: N/A;  . FEMORAL ARTERY STENT Left ~ 2014   "taken out of my leg; couldn' catorgorize what kind so the put it under all 3"; cataroziepitheloid hemanioendotheliomau  . FOOT FRACTURE SURGERY Left 1973  . FRACTURE SURGERY    . INSERT / REPLACE /  REMOVE PACEMAKER  10/13/05   right side, medtronic Adapta  . KNEE HARDWARE REMOVAL Right 1950's   "3-4 months after the insertion"  . KNEE SURGERY Right 1950's   "broke my lower leg; had to put pin in my knee to keep lower leg in place til it healed"  . PERIPHERAL VASCULAR CATHETERIZATION N/A 11/25/2015   Procedure: Lower Extremity Angiography;  Surgeon: Lorretta Harp, MD;  Location: [facility];  Service: Cardiovascular;  Laterality: N/A;  . PERIPHERAL VASCULAR CATHETERIZATION Left 11/25/2015   Procedure: Peripheral Vascular Intervention;  Surgeon: Lorretta Harp, MD;  Location: [facility];  Service: Cardiovascular;  Laterality: Left;  external iliac  . PERIPHERAL VASCULAR CATHETERIZATION N/A 01/03/2016   Procedure: Lower Extremity Angiography;  Surgeon: Lorretta Harp, MD;  Location: [facility];  Service: Cardiovascular;  Laterality: N/A;  . PERIPHERAL VASCULAR CATHETERIZATION Left 01/03/2016   Procedure: Peripheral Vascular Intervention;  Surgeon: Lorretta Harp, MD;  Location: [facility];  Service: Cardiovascular;  Laterality: Left;  SFA  . PERIPHERAL VASCULAR CATHETERIZATION Right 02/14/2016   Procedure: Peripheral Vascular Atherectomy;  Surgeon: Lorretta Harp, MD;  Location: [facility];  Service: Cardiovascular;  Laterality: Right;  SFA  . POPLITEAL ARTERY STENT  01/03/2016   Contralateral access with a 7 French crossover sheath (second order catheter placement)  . TONSILLECTOMY AND ADENOIDECTOMY    . TUMOR EXCISION Right ~ 2005   cancerous tumor removed from shoulder  . TUMOR EXCISION Right ~ 2000   benign tumor removed from under shoulder  . TUMOR EXCISION Left 06/02/2010   resection of Lt SFA wth interposition of Gore-Tex graft       Home Medications    Prior to Admission medications   Medication Sig Start Date End Date Taking? Authorizing Provider  acetaminophen (TYLENOL) 500 MG tablet Take 500 mg by mouth every 6 (six) hours as  needed for moderate pain or headache.     Historical Provider, MD  amLODipine (NORVASC) 10 MG tablet Take 10 mg by mouth daily.      Historical Provider, MD  amoxicillin (AMOXIL) 875 MG tablet Take 1 tablet (875 mg total) by mouth 2 (two) times daily. (Rx void after 03/08/16) 02/29/16   Kandra Nicolas, MD  aspirin EC 81 MG tablet Take 81 mg by mouth daily.      Historical Provider, MD  B Complex Vitamins (B COMPLEX-B12 PO) Take 1 tablet by mouth daily.     Historical Provider, MD  cholecalciferol (VITAMIN D) 1000 units tablet Take 1,000 Units by mouth 2 (two) times daily.     Historical Provider, MD  clopidogrel (PLAVIX) 75 MG tablet Take 1 tablet (75 mg total) by mouth daily with breakfast. 02/02/15   Almyra Deforest, PA  Doxylamine Succinate, Sleep, (SLEEP AID PO) Take 1 tablet by mouth as needed (for sleep).  Historical Provider, MD  gabapentin (NEURONTIN) 100 MG capsule Take 300 mg by mouth at bedtime.  01/16/13   Historical Provider, MD  glipiZIDE (GLUCOTROL) 5 MG tablet Take 10 mg by mouth daily.     Historical Provider, MD  isosorbide mononitrate (IMDUR) 30 MG 24 hr tablet Take 1 tablet (30 mg total) by mouth daily. 02/02/15   Almyra Deforest, PA  losartan-hydrochlorothiazide (HYZAAR) 100-25 MG per tablet Take 1 tablet by mouth daily.      Historical Provider, MD  lovastatin (MEVACOR) 40 MG tablet Take 40 mg by mouth at bedtime.      Historical Provider, MD  magnesium hydroxide (MILK OF MAGNESIA) 400 MG/5ML suspension Take 30 mLs by mouth as needed for moderate constipation or indigestion.  03/07/14   Historical Provider, MD  metFORMIN (GLUCOPHAGE) 1000 MG tablet Take 1,000 mg by mouth 2 (two) times daily with a meal.      Historical Provider, MD  metoprolol tartrate (LOPRESSOR) 50 MG tablet Take 1 tablet (50 mg total) by mouth 2 (two) times daily. 01/04/16   Arbutus Leas, NP  Multiple Vitamin (MULTIVITAMIN) capsule Take 1 capsule by mouth daily.      Historical Provider, MD  nitroGLYCERIN (NITROSTAT)  0.4 MG SL tablet Place 1 tablet (0.4 mg total) under the tongue every 5 (five) minutes as needed for chest pain. 02/02/15   Almyra Deforest, PA  Omega-3 Fatty Acids (FISH OIL PO) Take 1 tablet by mouth daily.    Historical Provider, MD  potassium chloride SA (K-DUR,KLOR-CON) 20 MEQ tablet Take 1 tablet (20 mEq total) by mouth daily. 02/18/16   Belkys A Regalado, MD  tamsulosin (FLOMAX) 0.4 MG CAPS capsule Take 0.4 mg by mouth 2 (two) times daily.  03/09/15   Historical Provider, MD  vitamin C (ASCORBIC ACID) 500 MG tablet Take 500 mg by mouth 2 (two) times daily.      Historical Provider, MD    Family History Family History  Problem Relation Age of Onset  . Coronary artery disease Mother   . Heart attack Mother   . Hypertension Mother   . Heart disease Mother     Open  Heart surgery  . Diabetes Father   . Heart disease Father   . Hyperlipidemia Father   . Heart attack Father   . Hypertension Sister   . Diabetes Sister   . Heart disease Sister     Social History Social History  Substance Use Topics  . Smoking status: Former Smoker    Years: 10.00    Types: Pipe    Quit date: 62  . Smokeless tobacco: Never Used     Comment: "quit smoking in 1973"  . Alcohol use No     Allergies   Peanut-containing drug products; Ace inhibitors; Clonidine hcl; Coreg [carvedilol]; Diltiazem hcl; Mobic [meloxicam]; and Ramipril   Review of Systems Review of Systems + sore throat + cough No pleuritic pain No wheezing + nasal congestion + post-nasal drainage No sinus pain/pressure No itchy/red eyes No earache No hemoptysis No SOB No fever, + chills No nausea No vomiting No abdominal pain No diarrhea No urinary symptoms No skin rash + fatigue + myalgias + headache Used OTC meds without relief   Physical Exam Triage Vital Signs ED Triage Vitals  Enc Vitals Group     BP 02/29/16 1424 146/77     Pulse Rate 02/29/16 1424 90     Resp 02/29/16 1424 18     Temp 02/29/16 1424 97.7  F (36.5 C)     Temp Source 02/29/16 1424 Oral     SpO2 02/29/16 1424 98 %     Weight --      Height --      Head Circumference --      Peak Flow --      Pain Score 02/29/16 1425 0     Pain Loc --      Pain Edu? --      Excl. in [place]? --    No data found.   Updated Vital Signs BP 146/77 (BP Location: Left Arm)   Pulse 90   Temp 97.7 F (36.5 C) (Oral)   Resp 18   SpO2 98%   Visual Acuity Right Eye Distance:   Left Eye Distance:   Bilateral Distance:    Right Eye Near:   Left Eye Near:    Bilateral Near:     Physical Exam Nursing notes and Vital Signs reviewed. Appearance:  Patient appears stated age, and in no acute distress.  He is alert and oriented today. Eyes:  Pupils are equal, round, and reactive to light and accomodation.  Extraocular movement is intact.  Conjunctivae are not inflamed  Ears:  Canals normal.  Tympanic membranes normal.  Nose:  Mildly congested turbinates.  No sinus tenderness.    Pharynx:  No erythema; moist mucous membranes  Neck:  Supple.  Tender enlarged posterior/lateral nodes are palpated bilaterally  Lungs:  Clear to auscultation.  Breath sounds are equal.  Moving air well. Heart:  Regular rate and rhythm without murmurs, rubs, or gallops.  Abdomen:  Nontender without masses or hepatosplenomegaly.  Bowel sounds are present.  No CVA or flank tenderness.  Extremities:  No edema.  Skin:  No rash present.    UC Treatments / Results  Labs (all labs ordered are listed, but only abnormal results are displayed) Labs Reviewed - No data to display  EKG  EKG Interpretation None       Radiology   Procedures Procedures (including critical care time)  Medications Ordered in UC Medications - No data to display   Initial Impression / Assessment and Plan / UC Course  I have reviewed the triage vital signs and the nursing notes.  Pertinent labs & imaging results that were available during my care of the patient were reviewed by me and  considered in my medical decision making (see chart for details).  Clinical Course   Reassured patient that he has a viral URI, and there is no evidence of bacterial infection today.   Take plain guaifenesin (1200mg  extended release tabs such as Mucinex) twice daily, with plenty of water, for cough and congestion.  Get adequate rest.   May use Afrin nasal spray (or generic oxymetazoline) twice daily for about 5 days and then discontinue.  Also recommend using saline nasal spray several times daily and saline nasal irrigation (AYR is a common brand).  Use Flonase nasal spray each morning after using Afrin nasal spray and saline nasal irrigation. Try warm salt water gargles for sore throat.  Stop all antihistamines for now, and other non-prescription cough/cold preparations. May take Delsym Cough Suppressant at bedtime for nighttime cough.  Begin Amoxicillin if not improving about one week or if persistent fever develops (Given a prescription to hold, with an expiration date)  Follow-up with family doctor if not improving about10 days.      Final Clinical Impressions(s) / UC Diagnoses   Final diagnoses:  Viral URI with cough  New Prescriptions New Prescriptions   AMOXICILLIN (AMOXIL) 875 MG TABLET    Take 1 tablet (875 mg total) by mouth 2 (two) times daily. (Rx void after 03/08/16)     Kandra Nicolas, MD 03/02/16 1414

## 2016-02-29 NOTE — Discharge Instructions (Signed)
Take plain guaifenesin (1200mg  extended release tabs such as Mucinex) twice daily, with plenty of water, for cough and congestion.  Get adequate rest.   May use Afrin nasal spray (or generic oxymetazoline) twice daily for about 5 days and then discontinue.  Also recommend using saline nasal spray several times daily and saline nasal irrigation (AYR is a common brand).  Use Flonase nasal spray each morning after using Afrin nasal spray and saline nasal irrigation. Try warm salt water gargles for sore throat.  Stop all antihistamines for now, and other non-prescription cough/cold preparations. May take Delsym Cough Suppressant at bedtime for nighttime cough.  Begin Amoxicillin if not improving about one week or if persistent fever develops   Follow-up with family doctor if not improving about10 days.

## 2016-02-29 NOTE — ED Triage Notes (Signed)
Pt c/o nasal congestion and sinus pressure x 5 days.

## 2016-03-01 ENCOUNTER — Telehealth: Payer: Self-pay | Admitting: Cardiovascular Disease

## 2016-03-01 DIAGNOSIS — I739 Peripheral vascular disease, unspecified: Secondary | ICD-10-CM

## 2016-03-01 NOTE — Telephone Encounter (Signed)
Results given to pt. Pt verbalized understanding.  Repeat dopplers ordered.

## 2016-03-01 NOTE — Telephone Encounter (Signed)
-----   Message from Lorretta Harp, MD sent at 03/01/2016  1:40 PM EST ----- Nl ABIs post bilateral intervention. Repeat 6 months

## 2016-03-06 ENCOUNTER — Ambulatory Visit (INDEPENDENT_AMBULATORY_CARE_PROVIDER_SITE_OTHER): Payer: Medicare HMO | Admitting: *Deleted

## 2016-03-06 ENCOUNTER — Encounter (HOSPITAL_COMMUNITY): Payer: Medicare HMO

## 2016-03-06 DIAGNOSIS — I495 Sick sinus syndrome: Secondary | ICD-10-CM

## 2016-03-08 NOTE — Progress Notes (Signed)
Remote pacemaker transmission.   

## 2016-03-09 ENCOUNTER — Encounter: Payer: Self-pay | Admitting: Cardiology

## 2016-03-14 ENCOUNTER — Ambulatory Visit (INDEPENDENT_AMBULATORY_CARE_PROVIDER_SITE_OTHER): Payer: Medicare HMO | Admitting: Cardiovascular Disease

## 2016-03-14 ENCOUNTER — Encounter: Payer: Self-pay | Admitting: Cardiovascular Disease

## 2016-03-14 DIAGNOSIS — I739 Peripheral vascular disease, unspecified: Secondary | ICD-10-CM | POA: Diagnosis not present

## 2016-03-14 NOTE — Patient Instructions (Signed)
Medication Instructions: Your physician recommends that you continue on your current medications as directed. Please refer to the Current Medication list given to you today.   Labwork: I will request labs from Dr. Laurann Montana.  Testing/Procedures: Your physician has requested that you have a lower extremity arterial duplex in May 2018. During this test, ultrasound is used to evaluate arterial blood flow in the legs. Allow one hour for this exam. There are no restrictions or special instructions.  Your physician has requested that you have an ankle brachial index (ABI) in May 2018. During this test an ultrasound and blood pressure cuff are used to evaluate the arteries that supply the arms and legs with blood. Allow thirty minutes for this exam. There are no restrictions or special instructions.  Follow-Up: Your physician wants you to follow-up in: 6 months with Dr. Gwenlyn Found. You will receive a reminder letter in the mail two months in advance. If you don't receive a letter, please call our office to schedule the follow-up appointment.  If you need a refill on your cardiac medications before your next appointment, please call your pharmacy.

## 2016-03-14 NOTE — Assessment & Plan Note (Signed)
Robert Burns returns after his recent staged right SFA diamondback orbital rotation left colectomy followed by drug-eluting Lynne angioplasty 02/14/16. He had multiple procedures on his left leg preceding that including stenting of his left common femoral artery at a bypass graft anastomotic site as well as Viabahn covered  stenting of his entire occluded left SFA. His claudication is greatly improved. His recent Dopplers performed 02/24/16 revealed normal ABIs bilaterally and normal velocities. We will repeat his lower extremity until Dopplers in 6 months. He remains on dual antiplatelet therapy.

## 2016-03-14 NOTE — Progress Notes (Signed)
03/14/2016 Robert Burns   05-Mar-1941  [id number]  Primary Physician Irven Shelling, MD Primary Cardiologist: Lorretta Harp MD [name]  HPI:  The patient is a very pleasant 75 year old, married Caucasian male, father of 48, grandfather to 3 grandchildren whose wife Robert Burns who is also a patient of mine. I last saw him in the office 01/26/16.[name] He has a history of CAD status post coronary artery bypass grafting March 2004 with a LIMA to his LAD, a vein to a diagonal branch, a sequential vein to a ramus and OM branch, as well as a vein to the PDA. Last functional study performed July 28, 2011, was entirely normal. 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 He had a pacemaker placed for sick sinus syndrome November 2008 followed by Dr. Sallyanne Kuster. He has obstructive sleep apnea on CPAP. He complain of left thigh pain and I angiogram'd him revealing patent arteries though he did have a space-occupying lesion which was removed by Dr. Kellie Simmering with placement of an interposition 6-mm Gore-Tex graft. The pathology was uncertain. 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 Dr. Baxter Flattery follows his lipid profile.Since I saw him back 11/07/12 he has done well. Followup Dopplers performed in our office 09/27/12 revealed a high-grade lesion in the distal right SFA with an ABI of 0.3. His left ABI 1.1 without obstructive disease. His most recent lower extremity Doppler Dopplers performed 9/32/16 revealed a right ABI 0.82 And a left ABI of 0.89. Over the last 3 months he's noticed anginal chest pain occurring both at rest and with exertion with left upper extremity radiation. He also complains of left lower extremity discomfort. to moderate anterolateral ischemia. Because of ongoing chest pain and a Myoview that showed anterolateral ischemia and he underwent cardiac catheterization on 02/01/15 revealing high grade segmental proximal right SVG disease and subtotally  occluded diagonal branch SVG. He underwent stenting of his RCA SVG successfully. He does have continued chest pain although somewhat improved since his last procedure. I brought him back for staged diagonal branch SVG intervention on 03/01/15. This was successful and since that time he denies chest pain or shortness of breath. He underwent a generator change by Dr. Sallyanne Kuster in January for end-of-life of his Medtronic pacemaker.Since I saw him 6 months ago he developed new left calf claudication of the last 3 months. Lower extremity arterial Doppler studies performed today revealed a decline in his left ABI from 0.87 down to 0.59 with an occluded left SFA that is a new finding. 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 He had a short segment occlusion of the proximal left SFA with patent mid left SFA stent. He had 90% segmental calcified mid right SFA stenosis with three-vessel runoff bilaterally. I ended up stenting his left external iliac artery with a 9 mm x 40 mm long nitinol self-expanding stent which improved his symptoms somewhat although he continuedto have lifestyle limiting claudication. He underwent staged intervention of his left SFA on 01/03/16. I stented the entire quadrant totally occluded segment with a 6 mm x 250 mm long Viabahn Stent. His claudication on the left has resolved and his ABIs normalized. He now complains of right leg medication. I did demonstrate a 90% calcified segmental mid right SFA stenosis at the time of angiography 11/25/15. He underwent diamondback orbital rotation atherectomy/drug-eluting angioplasty  of his mid calcified right SFA by myself 02/14/16. This follow-up Dopplers performed 02/24/16 revealed normal ABIs bilaterally. His claudication has improved.   Current Outpatient Prescriptions  Medication Sig Dispense Refill   . acetaminophen (TYLENOL) 500 MG tablet Take 500 mg by mouth every 6 (six) hours as needed for moderate pain or headache.     [name] amLODipine (NORVASC) 10 MG tablet Take 10 mg by mouth daily.      [name] amoxicillin (AMOXIL) 875 MG tablet Take 1 tablet (875 mg total) by mouth 2 (two) times daily. (Rx void after 03/08/16) 20 tablet 0  . aspirin EC 81 MG tablet Take 81 mg by mouth daily.      . B Complex Vitamins (B COMPLEX-B12 PO) Take 1 tablet by mouth daily.     . cholecalciferol (VITAMIN D) 1000 units tablet Take 1,000 Units by mouth 2 (two) times daily.     . clopidogrel (PLAVIX) 75 MG tablet Take 1 tablet (75 mg total) by mouth daily with breakfast. 90 tablet 3  . Doxylamine Succinate, Sleep, (SLEEP AID PO) Take 1 tablet by mouth as needed (for sleep).     . gabapentin (NEURONTIN) 100 MG capsule Take 300 mg by mouth at bedtime.     [name] glipiZIDE (GLUCOTROL) 5 MG tablet Take 10 mg by mouth daily.     . isosorbide mononitrate (IMDUR) 30 MG 24 hr tablet Take 1 tablet (30 mg total) by mouth daily. 30 tablet 11  . losartan-hydrochlorothiazide (HYZAAR) 100-25 MG per tablet Take 1 tablet by mouth daily.      [name] lovastatin (MEVACOR) 40 MG tablet Take 40 mg by mouth at bedtime.      . magnesium hydroxide (MILK OF MAGNESIA) 400 MG/5ML suspension Take 30 mLs by mouth as needed for moderate constipation or indigestion.     . metFORMIN (GLUCOPHAGE) 1000 MG tablet Take 1,000 mg by mouth 2 (two) times daily with a meal.      . metoprolol tartrate (LOPRESSOR) 50 MG tablet Take 1 tablet (50 mg total) by mouth 2 (two) times daily. 60 tablet 12  . Multiple Vitamin (MULTIVITAMIN) capsule Take 1 capsule by mouth daily.      . nitroGLYCERIN (NITROSTAT) 0.4 MG SL tablet Place 1 tablet (0.4 mg total) under the tongue every 5 (five) minutes as needed for chest pain. 25 tablet 3  . Omega-3 Fatty Acids (FISH OIL PO) Take 1 tablet by mouth daily.    . potassium chloride SA (K-DUR,KLOR-CON) 20 MEQ tablet Take 1 tablet (20 mEq  total) by mouth daily. 15 tablet 0  . tamsulosin (FLOMAX) 0.4 MG CAPS capsule Take 0.4 mg by mouth 2 (two) times daily.     . vitamin C (ASCORBIC ACID) 500 MG tablet Take 500 mg by mouth 2 (two) times daily.       No current facility-administered medications for this visit.     Allergies  Allergen Reactions  . Peanut-Containing Drug Products Anaphylaxis and Other (See Comments)    Tongue swelling is severe  . Ace Inhibitors Other (See Comments)    Cough  . Clonidine Hcl Other (See Comments)    Patch only - skin irritation  . Coreg [Carvedilol] Itching  . Diltiazem Hcl Other (See Comments)    Unknown  . Mobic [Meloxicam] Other (See Comments)    GI upset  . Ramipril Other (See Comments)    Unknown    Social History   Social History  . Marital status: Married    Spouse  name: N/A  . Number of children: N/A  . Years of education: N/A   Occupational History  . Not on file.   Social History Main Topics  . Smoking status: Former Smoker    Years: 10.00    Types: Pipe    Quit date: 73  . Smokeless tobacco: Never Used     Comment: "quit smoking in 1973"  . Alcohol use No  . Drug use: No  . Sexual activity: Not Currently   Other Topics Concern  . Not on file   Social History Narrative  . No narrative on file     Review of Systems: General: negative for chills, fever, night sweats or weight changes.  Cardiovascular: negative for chest pain, dyspnea on exertion, edema, orthopnea, palpitations, paroxysmal nocturnal dyspnea or shortness of breath Dermatological: negative for rash Respiratory: negative for cough or wheezing Urologic: negative for hematuria Abdominal: negative for nausea, vomiting, diarrhea, bright red blood per rectum, melena, or hematemesis Neurologic: negative for visual changes, syncope, or dizziness All other systems reviewed and are otherwise negative except as noted above.    Blood pressure (!) 144/72, pulse 94, height 5\' 8"  (1.727 m), weight  175 lb (79.4 kg).  General appearance: alert and no distress Neck: no adenopathy, no carotid bruit, no JVD, supple, symmetrical, trachea midline and thyroid not enlarged, symmetric, no tenderness/mass/nodules Lungs: clear to auscultation bilaterally Heart: regular rate and rhythm, S1, S2 normal, no murmur, click, rub or gallop Extremities: extremities normal, atraumatic, no cyanosis or edema  EKG not performed today  ASSESSMENT AND PLAN:   Peripheral arterial disease [facility]) Mr. Leap returns after his recent staged right SFA diamondback orbital rotation left colectomy followed by drug-eluting Lynne angioplasty 02/14/16. He had multiple procedures on his left leg preceding that including stenting of his left common femoral artery at a bypass graft anastomotic site as well as Viabahn covered  stenting of his entire occluded left SFA. His claudication is greatly improved. His recent Dopplers performed 02/24/16 revealed normal ABIs bilaterally and normal velocities. We will repeat his lower extremity until Dopplers in 6 months. He remains on dual antiplatelet therapy.      Lorretta Harp MD FACP,FACC,FAHA, [facility] 03/14/2016 11:21 AM

## 2016-03-24 LAB — CUP PACEART REMOTE DEVICE CHECK
Battery Remaining Longevity: 155 mo
Battery Voltage: 2.8 V
Brady Statistic AP VP Percent: 0 %
Date Time Interrogation Session: 20171127150606
Implantable Lead Implant Date: 20070706
Implantable Lead Location: 753859
Implantable Pulse Generator Implant Date: 20170113
Lead Channel Impedance Value: 466 Ohm
Lead Channel Pacing Threshold Amplitude: 1.625 V
Lead Channel Setting Pacing Amplitude: 1.5 V
Lead Channel Setting Pacing Amplitude: 3.25 V
Lead Channel Setting Pacing Pulse Width: 0.4 ms
Lead Channel Setting Sensing Sensitivity: 5.6 mV
MDC IDC LEAD IMPLANT DT: 20070706
MDC IDC LEAD LOCATION: 753860
MDC IDC MSMT BATTERY IMPEDANCE: 100 Ohm
MDC IDC MSMT LEADCHNL RA PACING THRESHOLD AMPLITUDE: 0.75 V
MDC IDC MSMT LEADCHNL RA PACING THRESHOLD PULSEWIDTH: 0.4 ms
MDC IDC MSMT LEADCHNL RV IMPEDANCE VALUE: 658 Ohm
MDC IDC MSMT LEADCHNL RV PACING THRESHOLD PULSEWIDTH: 0.4 ms
MDC IDC STAT BRADY AP VS PERCENT: 98 %
MDC IDC STAT BRADY AS VP PERCENT: 0 %
MDC IDC STAT BRADY AS VS PERCENT: 1 %

## 2016-06-05 ENCOUNTER — Encounter: Payer: Medicare HMO | Admitting: *Deleted

## 2016-06-05 ENCOUNTER — Telehealth: Payer: Self-pay | Admitting: Cardiology

## 2016-06-05 NOTE — Telephone Encounter (Signed)
LMOVM reminding pt to send remote transmission.   

## 2016-06-14 ENCOUNTER — Encounter: Payer: Self-pay | Admitting: Cardiology

## 2016-07-03 ENCOUNTER — Ambulatory Visit (INDEPENDENT_AMBULATORY_CARE_PROVIDER_SITE_OTHER): Payer: Medicare HMO | Admitting: *Deleted

## 2016-07-03 DIAGNOSIS — I495 Sick sinus syndrome: Secondary | ICD-10-CM | POA: Diagnosis not present

## 2016-07-06 NOTE — Progress Notes (Signed)
Remote pacemaker transmission.   

## 2016-07-07 ENCOUNTER — Encounter: Payer: Self-pay | Admitting: Cardiology

## 2016-07-07 LAB — CUP PACEART REMOTE DEVICE CHECK
Battery Impedance: 137 Ohm
Battery Remaining Longevity: 138 mo
Brady Statistic AP VP Percent: 0 %
Implantable Lead Implant Date: 20070706
Implantable Lead Model: 5092
Implantable Pulse Generator Implant Date: 20170113
Lead Channel Impedance Value: 473 Ohm
Lead Channel Pacing Threshold Amplitude: 1 V
Lead Channel Pacing Threshold Amplitude: 1.625 V
Lead Channel Setting Pacing Amplitude: 2 V
Lead Channel Setting Pacing Pulse Width: 0.4 ms
Lead Channel Setting Sensing Sensitivity: 5.6 mV
MDC IDC LEAD IMPLANT DT: 20070706
MDC IDC LEAD LOCATION: 753859
MDC IDC LEAD LOCATION: 753860
MDC IDC MSMT BATTERY VOLTAGE: 2.8 V
MDC IDC MSMT LEADCHNL RA PACING THRESHOLD PULSEWIDTH: 0.4 ms
MDC IDC MSMT LEADCHNL RV IMPEDANCE VALUE: 687 Ohm
MDC IDC MSMT LEADCHNL RV PACING THRESHOLD PULSEWIDTH: 0.4 ms
MDC IDC MSMT LEADCHNL RV SENSING INTR AMPL: 16 mV
MDC IDC SESS DTM: 20180325223438
MDC IDC SET LEADCHNL RV PACING AMPLITUDE: 3.25 V
MDC IDC STAT BRADY AP VS PERCENT: 98 %
MDC IDC STAT BRADY AS VP PERCENT: 0 %
MDC IDC STAT BRADY AS VS PERCENT: 1 %

## 2016-10-04 ENCOUNTER — Ambulatory Visit (INDEPENDENT_AMBULATORY_CARE_PROVIDER_SITE_OTHER): Payer: Medicare HMO | Admitting: *Deleted

## 2016-10-04 ENCOUNTER — Telehealth: Payer: Self-pay | Admitting: Cardiology

## 2016-10-04 DIAGNOSIS — I495 Sick sinus syndrome: Secondary | ICD-10-CM | POA: Diagnosis not present

## 2016-10-04 NOTE — Telephone Encounter (Signed)
Spoke with pt and reminded pt of remote transmission that is due today. Pt verbalized understanding.   

## 2016-10-05 ENCOUNTER — Encounter: Payer: Self-pay | Admitting: Cardiology

## 2016-10-05 NOTE — Progress Notes (Signed)
Remote pacemaker transmission.   

## 2016-10-31 LAB — CUP PACEART REMOTE DEVICE CHECK
Battery Voltage: 2.8 V
Brady Statistic AP VS Percent: 98 %
Brady Statistic AS VP Percent: 0 %
Date Time Interrogation Session: 20180628003519
Implantable Lead Implant Date: 20070706
Implantable Lead Implant Date: 20070706
Implantable Lead Location: 753859
Lead Channel Impedance Value: 473 Ohm
Lead Channel Impedance Value: 671 Ohm
Lead Channel Pacing Threshold Amplitude: 1.625 V
Lead Channel Pacing Threshold Pulse Width: 0.4 ms
Lead Channel Setting Pacing Amplitude: 1.75 V
Lead Channel Setting Pacing Amplitude: 3.25 V
Lead Channel Setting Sensing Sensitivity: 5.6 mV
MDC IDC LEAD LOCATION: 753860
MDC IDC MSMT BATTERY IMPEDANCE: 137 Ohm
MDC IDC MSMT BATTERY REMAINING LONGEVITY: 141 mo
MDC IDC MSMT LEADCHNL RA PACING THRESHOLD AMPLITUDE: 0.875 V
MDC IDC MSMT LEADCHNL RA PACING THRESHOLD PULSEWIDTH: 0.4 ms
MDC IDC PG IMPLANT DT: 20170113
MDC IDC SET LEADCHNL RV PACING PULSEWIDTH: 0.4 ms
MDC IDC STAT BRADY AP VP PERCENT: 1 %
MDC IDC STAT BRADY AS VS PERCENT: 1 %

## 2017-01-03 ENCOUNTER — Telehealth: Payer: Self-pay | Admitting: Cardiology

## 2017-01-03 ENCOUNTER — Ambulatory Visit (INDEPENDENT_AMBULATORY_CARE_PROVIDER_SITE_OTHER): Payer: Medicare HMO | Admitting: *Deleted

## 2017-01-03 DIAGNOSIS — I495 Sick sinus syndrome: Secondary | ICD-10-CM

## 2017-01-03 NOTE — Telephone Encounter (Signed)
Spoke with pt and reminded pt of remote transmission that is due today. Pt verbalized understanding.   

## 2017-01-04 ENCOUNTER — Encounter: Payer: Self-pay | Admitting: Cardiology

## 2017-01-04 LAB — CUP PACEART REMOTE DEVICE CHECK
Battery Remaining Longevity: 140 mo
Brady Statistic AP VP Percent: 1 %
Brady Statistic AP VS Percent: 98 %
Brady Statistic AS VP Percent: 0 %
Implantable Lead Implant Date: 20070706
Implantable Lead Location: 753860
Implantable Lead Model: 5092
Implantable Pulse Generator Implant Date: 20170113
Lead Channel Impedance Value: 467 Ohm
Lead Channel Impedance Value: 654 Ohm
Lead Channel Pacing Threshold Amplitude: 0.875 V
Lead Channel Pacing Threshold Amplitude: 1.75 V
Lead Channel Pacing Threshold Pulse Width: 0.4 ms
Lead Channel Setting Pacing Amplitude: 1.75 V
MDC IDC LEAD IMPLANT DT: 20070706
MDC IDC LEAD LOCATION: 753859
MDC IDC MSMT BATTERY IMPEDANCE: 137 Ohm
MDC IDC MSMT BATTERY VOLTAGE: 2.8 V
MDC IDC MSMT LEADCHNL RV PACING THRESHOLD PULSEWIDTH: 0.4 ms
MDC IDC SESS DTM: 20180926230012
MDC IDC SET LEADCHNL RV PACING AMPLITUDE: 3.5 V
MDC IDC SET LEADCHNL RV PACING PULSEWIDTH: 0.4 ms
MDC IDC SET LEADCHNL RV SENSING SENSITIVITY: 5.6 mV
MDC IDC STAT BRADY AS VS PERCENT: 1 %

## 2017-01-04 NOTE — Progress Notes (Signed)
Remote pacemaker transmission.   

## 2017-02-06 ENCOUNTER — Ambulatory Visit (INDEPENDENT_AMBULATORY_CARE_PROVIDER_SITE_OTHER): Payer: Medicare HMO | Admitting: Cardiovascular Disease

## 2017-02-06 ENCOUNTER — Encounter: Payer: Self-pay | Admitting: Cardiovascular Disease

## 2017-02-06 VITALS — BP 152/72 | HR 82 | Ht 67.0 in | Wt 181.0 lb

## 2017-02-06 DIAGNOSIS — E1151 Type 2 diabetes mellitus with diabetic peripheral angiopathy without gangrene: Secondary | ICD-10-CM | POA: Diagnosis not present

## 2017-02-06 DIAGNOSIS — Z950 Presence of cardiac pacemaker: Secondary | ICD-10-CM

## 2017-02-06 DIAGNOSIS — I495 Sick sinus syndrome: Secondary | ICD-10-CM

## 2017-02-06 DIAGNOSIS — E7800 Pure hypercholesterolemia, unspecified: Secondary | ICD-10-CM | POA: Diagnosis not present

## 2017-02-06 DIAGNOSIS — I739 Peripheral vascular disease, unspecified: Secondary | ICD-10-CM

## 2017-02-06 DIAGNOSIS — I10 Essential (primary) hypertension: Secondary | ICD-10-CM | POA: Diagnosis not present

## 2017-02-06 DIAGNOSIS — I25708 Atherosclerosis of coronary artery bypass graft(s), unspecified, with other forms of angina pectoris: Secondary | ICD-10-CM | POA: Diagnosis not present

## 2017-02-06 NOTE — Progress Notes (Signed)
Cardiology Office Note:    Date:  02/06/2017   ID:  Robert Burns, DOB 15-Oct-1940, MRN [id number]  PCP:  [name], MD  Cardiologist:  Sanda Klein, MD    Referring MD: [name], MD   Chief complaint: Pacemaker follow-up  History of Present Illness:    Robert Burns is a 76 y.o. male with a hx of symptomatic sinus bradycardia who received a dual-chamber permanent pacemaker in 2007 with a generator change out in January 2017 (Medtronic Adapta).  Returning for follow-up.  He sees Dr. Quay Burow for coronary atherosclerosis and peripheral artery disease with multiple revascularization procedures (bypass surgery 2004, drug-eluting stent to SVG-PDA and SVG to diagonal in 2016, angioplasty left SFA 2004 and stent September 2017, right SFA November 2017).  He has relatively mild intermittent claudication and mild exertional chest discomfort that did not really interfere with daily activity.  They do not occur when he walks at a normal pace on level ground, but may happen if he rushes.  Both legs bother him to the same degree.  The claudication will stop and before the chest discomfort if he tries to move very fast.  Denies palpitations, syncope, dizziness, focal neurological events or leg edema.  As is typical for him, his pacemaker shows virtually 100% atrial paced rhythm with satisfactory heart rate histogram distribution and virtually no ventricular pacing.  There have been no episodes of atrial mode switches or ventricular tachycardia.  Estimated battery longevity is around 11 years and lead parameters remain good.  Past Medical History:  Diagnosis Date  . Arthritis    "all over" (11/25/2015)  . CAD (coronary artery disease)    a. s/p CABG  06/2002; b. 07/18/11 nuc study: normal EF and negative for ischemia; c. 02/01/15 PCI: DES to prox SVG to PDA, staged PCI of SVG to Diag in 02/2015.  [name] Cancer (HCC)    Right Shoulder, Left Leg- BCC, SCC, AND MELANOMA  . Hyperlipidemia   .  Hypertension   . Leg pain   . OSA on CPAP   . PAD (peripheral artery disease) ([place])    a. 10/2002 L SFA PTA/BMS; b. 8/17 LE Angio: LEIA 90 (9x40 self exp stent), LSFA short segment prox occlusion (staged PTA/stenting 01/03/2016), patent mid stent, RSFA 13m (staged PTA/DEB 02/14/2016).  . Presence of permanent cardiac pacemaker   . SSS (sick sinus syndrome) ([place])    a. s/p PPM in 2007 with gen change 04/2015 - Medtronic Adapta ADDRL1, ser # [id number].  . Type II diabetes mellitus ([place])   . Urgency of urination     Past Surgical History:  Procedure Laterality Date  . CARDIAC CATHETERIZATION N/A 02/01/2015   Procedure: Left Heart Cath and Coronary Angiography;  Surgeon: Lorretta Harp, MD;  Location: [place] [facility];  Service: Cardiovascular;  Laterality: N/A;  . CARDIAC CATHETERIZATION N/A 02/01/2015   Procedure: Coronary Stent Intervention;  Surgeon: Lorretta Harp, MD;  Location: [place] [facility];  Service: Cardiovascular;  Laterality: N/A;  . CARDIAC CATHETERIZATION  06/2002   "just before bypass OR"  . CARDIAC CATHETERIZATION N/A 03/01/2015   Procedure: Coronary Stent Intervention;  Surgeon: Lorretta Harp, MD;  Location: [place] [facility];  Service: Cardiovascular;  Laterality: N/A;  . CORONARY ANGIOPLASTY    . CORONARY ARTERY BYPASS GRAFT  06/2002   x5, LIMA-LAD;VG- Diag; seq VG- ramus & OM branch; VG-PDA  . EP IMPLANTABLE DEVICE N/A 04/23/2015   Procedure: PPM Generator Changeout;  Surgeon: Dani Gobble  Rhett Najera, MD;  Location: [place] [facility];  Service: Cardiovascular;  Laterality: N/A;  . FEMORAL ARTERY STENT Left ~ 2014   "taken out of my leg; couldn' catorgorize what kind so the put it under all 3"; cataroziepitheloid hemanioendotheliomau  . FOOT FRACTURE SURGERY Left 1973  . FRACTURE SURGERY    . INSERT / REPLACE / REMOVE PACEMAKER  10/13/05   right side, medtronic Adapta  . KNEE HARDWARE REMOVAL Right 1950's   "3-4 months after the insertion"  . KNEE SURGERY Right 1950's    "broke my lower leg; had to put pin in my knee to keep lower leg in place til it healed"  . PERIPHERAL VASCULAR CATHETERIZATION N/A 11/25/2015   Procedure: Lower Extremity Angiography;  Surgeon: Lorretta Harp, MD;  Location: [place] [facility];  Service: Cardiovascular;  Laterality: N/A;  . PERIPHERAL VASCULAR CATHETERIZATION Left 11/25/2015   Procedure: Peripheral Vascular Intervention;  Surgeon: Lorretta Harp, MD;  Location: [place] [facility];  Service: Cardiovascular;  Laterality: Left;  external iliac  . PERIPHERAL VASCULAR CATHETERIZATION N/A 01/03/2016   Procedure: Lower Extremity Angiography;  Surgeon: Lorretta Harp, MD;  Location: [place] [facility];  Service: Cardiovascular;  Laterality: N/A;  . PERIPHERAL VASCULAR CATHETERIZATION Left 01/03/2016   Procedure: Peripheral Vascular Intervention;  Surgeon: Lorretta Harp, MD;  Location: [place] [facility];  Service: Cardiovascular;  Laterality: Left;  SFA  . PERIPHERAL VASCULAR CATHETERIZATION Right 02/14/2016   Procedure: Peripheral Vascular Atherectomy;  Surgeon: Lorretta Harp, MD;  Location: [place] [facility];  Service: Cardiovascular;  Laterality: Right;  SFA  . POPLITEAL ARTERY STENT  01/03/2016   Contralateral access with a 7 French crossover sheath (second order catheter placement)  . TONSILLECTOMY AND ADENOIDECTOMY    . TUMOR EXCISION Right ~ 2005   cancerous tumor removed from shoulder  . TUMOR EXCISION Right ~ 2000   benign tumor removed from under shoulder  . TUMOR EXCISION Left 06/02/2010   resection of Lt SFA wth interposition of Gore-Tex graft    Current Medications: Current Meds  Medication Sig  . acetaminophen (TYLENOL) 500 MG tablet Take 500 mg by mouth every 6 (six) hours as needed for moderate pain or headache.   [name] amLODipine (NORVASC) 10 MG tablet Take 10 mg by mouth daily.    [name] aspirin EC 81 MG tablet Take 81 mg by mouth daily.    . B Complex Vitamins (B COMPLEX-B12 PO) Take 1 tablet by mouth daily.    . cholecalciferol (VITAMIN D) 1000 units tablet Take 1,000 Units by mouth 2 (two) times daily.   . clopidogrel (PLAVIX) 75 MG tablet Take 1 tablet (75 mg total) by mouth daily with breakfast.  . Doxylamine Succinate, Sleep, (SLEEP AID PO) Take 1 tablet by mouth as needed (for sleep).   . gabapentin (NEURONTIN) 100 MG capsule Take 300 mg by mouth at bedtime.   [name] glipiZIDE (GLUCOTROL) 5 MG tablet Take 10 mg by mouth daily.   . isosorbide mononitrate (IMDUR) 30 MG 24 hr tablet Take 1 tablet (30 mg total) by mouth daily.  [name] losartan-hydrochlorothiazide (HYZAAR) 100-25 MG per tablet Take 1 tablet by mouth daily.    [name] lovastatin (MEVACOR) 40 MG tablet Take 40 mg by mouth at bedtime.    . magnesium hydroxide (MILK OF MAGNESIA) 400 MG/5ML suspension Take 30 mLs by mouth as needed for moderate constipation or indigestion.   . metFORMIN (GLUCOPHAGE) 1000 MG tablet Take 1,000 mg by mouth 2 (  two) times daily with a meal.    . metoprolol tartrate (LOPRESSOR) 50 MG tablet Take 1 tablet (50 mg total) by mouth 2 (two) times daily.  . Multiple Vitamin (MULTIVITAMIN) capsule Take 1 capsule by mouth daily.    . nitroGLYCERIN (NITROSTAT) 0.4 MG SL tablet Place 1 tablet (0.4 mg total) under the tongue every 5 (five) minutes as needed for chest pain.  . Omega-3 Fatty Acids (FISH OIL PO) Take 1 tablet by mouth daily.  . potassium chloride SA (K-DUR,KLOR-CON) 20 MEQ tablet Take 1 tablet (20 mEq total) by mouth daily.  . tamsulosin (FLOMAX) 0.4 MG CAPS capsule Take 0.4 mg by mouth 2 (two) times daily.   . vitamin C (ASCORBIC ACID) 500 MG tablet Take 500 mg by mouth 2 (two) times daily.    . [DISCONTINUED] amoxicillin (AMOXIL) 875 MG tablet Take 1 tablet (875 mg total) by mouth 2 (two) times daily. (Rx void after 03/08/16)     Allergies:   Peanut-containing drug products; Ace inhibitors; Clonidine hcl; Coreg [carvedilol]; Diltiazem hcl; Mobic [meloxicam]; and Ramipril   Social History   Social History  . Marital  status: Married    Spouse name: N/A  . Number of children: N/A  . Years of education: N/A   Social History Main Topics  . Smoking status: Former Smoker    Years: 10.00    Types: Pipe    Quit date: 68  . Smokeless tobacco: Never Used     Comment: "quit smoking in 1973"  . Alcohol use No  . Drug use: No  . Sexual activity: Not Currently   Other Topics Concern  . None   Social History Narrative  . None     Family History: The patient's family history includes Coronary artery disease in his mother; Diabetes in his father and sister; Heart attack in his father and mother; Heart disease in his father, mother, and sister; Hyperlipidemia in his father; Hypertension in his mother and sister. ROS:   Please see the history of present illness.    All other systems reviewed and are negative.  EKGs/Labs/Other Studies Reviewed:    EKG:  EKG is ordered today.  The ekg ordered today demonstrates shows atrial paced, ventricular sensed rhythm with rightward axis and ST depression/T wave inversion in the inferior leads.  The inferior leads repolarization abnormalities appear more prominent on today's tracing, although they have waxed and waned in severity over the years.  Recent Labs: 02/15/2016: Magnesium 2.1 02/28/2016: ALT 23; BUN 19; Creatinine, Ser 1.00; Hemoglobin 12.2; Platelets 165; Potassium 4.1; Sodium 140  Recent Lipid Panel Labs from [facility] physicians November 2017 show LDL 43, HDL 29  Physical Exam:    VS:  BP (!) 152/72   Pulse 82   Ht 5\' 7"  (1.702 m)   Wt 181 lb (82.1 kg)   SpO2 98%   BMI 28.35 kg/m     Wt Readings from Last 3 Encounters:  02/06/17 181 lb (82.1 kg)  03/14/16 175 lb (79.4 kg)  02/28/16 175 lb (79.4 kg)  Recheck BP 132/70 mmHg GEN:  Well nourished, well developed in no acute distress HEENT: Normal NECK: No JVD; No carotid bruits LYMPHATICS: No lymphadenopathy CARDIAC: RRR, no murmurs, rubs, gallops.  Right subclavian pacemaker site looks healthy.   Normal pulses in the upper extremities without subclavian bruits.  Bilateral femoral bruits.  Normal femoral pulses.  Very thready pulses in the right foot, 1+ on the left foot. RESPIRATORY:  Clear to auscultation without rales,  wheezing or rhonchi  ABDOMEN: Soft, non-tender, non-distended MUSCULOSKELETAL:  No edema; No deformity  SKIN: Warm and dry NEUROLOGIC:  Alert and oriented x 3 PSYCHIATRIC:  Normal affect   ASSESSMENT:    1. SSS (sick sinus syndrome), medtronic adapta   2. Coronary artery disease of bypass graft of native heart with stable angina pectoris ([place])   3. Peripheral arterial disease ([place])   4. Pure hypercholesterolemia   5. Essential hypertension   6. Type 2 diabetes mellitus with diabetic peripheral angiopathy without gangrene, without long-term current use of insulin (HCC)    PLAN:    In order of problems listed above:  1. SSS: No symptoms to suggest chronotropic incompetence despite virtually 100% atrial pacing.  The satisfactory sensor settings. 2. PPM: Normal device function, continue remote downloads every 3 months and yearly office visit 3. CAD: appears to have mild exertional angina pectoris, CCS class II.  Discussed the difference between symptoms of stable and unstable angina.  It does not appear that he needs any adjustment in his antianginal medications.  Is already on amlodipine and long-acting nitrates and has a history of intolerance to carvedilol.  Had previous bypass surgery as well as drug-eluting stents placed in 2 of his venous grafts.  ECG changes are little more striking on today's tracing.  Will defer further workup to Dr. Gwenlyn Found. 4. PAD: Has mild intermittent claudication, not lifestyle limiting.  He is overdue for repeat lower extremity ultrasound and follow-up with Dr. Gwenlyn Found; will schedule today. 5. HLP: Excellent LDL cholesterol at last check.  On statin. 6. HTN: Pressure was mildly elevated on arrival, improved when rechecked. 7. DM: Patient  reports good glycemic control   Medication Adjustments/Labs and Tests Ordered: Current medicines are reviewed at length with the patient today.  Concerns regarding medicines are outlined above.  No orders of the defined types were placed in this encounter.  No orders of the defined types were placed in this encounter.   Signed, Sanda Klein, MD  02/06/2017 11:07 AM    [place]

## 2017-02-06 NOTE — Patient Instructions (Signed)
Dr Sallyanne Kuster recommends that you continue on your current medications as directed. Please refer to the Current Medication list given to you today.  Remote monitoring is used to monitor your Pacemaker or ICD from home. This monitoring reduces the number of office visits required to check your device to one time per year. It allows [place] to keep an eye on the functioning of your device to ensure it is working properly. You are scheduled for a device check from home on Thursday, December 27th, 2018. You may send your transmission at any time that day. If you have a wireless device, the transmission will be sent automatically. After your physician reviews your transmission, you will receive a notification with your next transmission date.  Please schedule your lower extremity arterial dopplers.  Your physician recommends that you schedule a follow-up appointment first available with Dr Gwenlyn Found.  Dr Sallyanne Kuster recommends that you schedule a follow-up appointment in 12 months with a pacemaker check. You will receive a reminder letter in the mail two months in advance. If you don't receive a letter, please call our office to schedule the follow-up appointment.  If you need a refill on your cardiac medications before your next appointment, please call your pharmacy.

## 2017-02-08 ENCOUNTER — Other Ambulatory Visit: Payer: Self-pay | Admitting: Cardiovascular Disease

## 2017-02-08 DIAGNOSIS — I739 Peripheral vascular disease, unspecified: Secondary | ICD-10-CM

## 2017-02-28 ENCOUNTER — Ambulatory Visit (HOSPITAL_COMMUNITY)
Admission: RE | Admit: 2017-02-28 | Discharge: 2017-02-28 | Disposition: A | Discharge status: Home / Self Care | Payer: Medicare HMO | Source: Ambulatory Visit | Attending: Cardiology | Admitting: Cardiology

## 2017-02-28 DIAGNOSIS — E1151 Type 2 diabetes mellitus with diabetic peripheral angiopathy without gangrene: Secondary | ICD-10-CM | POA: Diagnosis not present

## 2017-02-28 DIAGNOSIS — I739 Peripheral vascular disease, unspecified: Secondary | ICD-10-CM | POA: Diagnosis not present

## 2017-02-28 DIAGNOSIS — R9389 Abnormal findings on diagnostic imaging of other specified body structures: Secondary | ICD-10-CM | POA: Diagnosis not present

## 2017-02-28 DIAGNOSIS — I2510 Atherosclerotic heart disease of native coronary artery without angina pectoris: Secondary | ICD-10-CM | POA: Diagnosis not present

## 2017-02-28 DIAGNOSIS — Z87891 Personal history of nicotine dependence: Secondary | ICD-10-CM | POA: Diagnosis not present

## 2017-02-28 DIAGNOSIS — I70203 Unspecified atherosclerosis of native arteries of extremities, bilateral legs: Secondary | ICD-10-CM | POA: Diagnosis not present

## 2017-02-28 DIAGNOSIS — E785 Hyperlipidemia, unspecified: Secondary | ICD-10-CM | POA: Insufficient documentation

## 2017-02-28 DIAGNOSIS — I10 Essential (primary) hypertension: Secondary | ICD-10-CM | POA: Diagnosis not present

## 2017-03-06 ENCOUNTER — Ambulatory Visit: Payer: Medicare HMO | Admitting: Cardiovascular Disease

## 2017-03-06 ENCOUNTER — Encounter: Payer: Self-pay | Admitting: Cardiovascular Disease

## 2017-03-06 VITALS — BP 150/74 | HR 85 | Ht 67.0 in | Wt 183.6 lb

## 2017-03-06 DIAGNOSIS — I10 Essential (primary) hypertension: Secondary | ICD-10-CM | POA: Diagnosis not present

## 2017-03-06 DIAGNOSIS — I70212 Atherosclerosis of native arteries of extremities with intermittent claudication, left leg: Secondary | ICD-10-CM

## 2017-03-06 DIAGNOSIS — I495 Sick sinus syndrome: Secondary | ICD-10-CM

## 2017-03-06 DIAGNOSIS — R079 Chest pain, unspecified: Secondary | ICD-10-CM

## 2017-03-06 MED ORDER — ISOSORBIDE MONONITRATE ER 60 MG PO TB24: 60.0000 mg | ORAL_TABLET | Freq: Every day | ORAL | 6 refills | Status: DC

## 2017-03-06 NOTE — Patient Instructions (Signed)
Medication Instructions: Your physician recommends that you continue on your current medications as directed. Please refer to the Current Medication list given to you today.  Increase Isosorbide to 60 mg daily.   Testing/Procedures: Your physician has requested that you have a lexiscan myoview. For further information please visit [email or web address]. Please follow instruction sheet, as given.  Follow-Up: Your physician recommends that you schedule a follow-up appointment in: 4-6 weeks with Dr. Gwenlyn Found.  If you need a refill on your cardiac medications before your next appointment, please call your pharmacy.

## 2017-03-06 NOTE — Assessment & Plan Note (Signed)
History of sick sinus syndrome status post permanent transvenous pacemaker insertion 10/13/05 thought by Dr. Sallyanne Kuster.

## 2017-03-06 NOTE — Assessment & Plan Note (Signed)
History of hyperlipidemia on statin therapy followed by his PCP 

## 2017-03-06 NOTE — Assessment & Plan Note (Signed)
History of peripheral arterial disease status post intervention on both lower extremities including a left femoropopliteal bypass graft, stenting of his left iliac as well as right SFA intervention. His most recent Dopplers reveal patent stents with moderate in-stent restenosis. He really denies claudication. We'll continue to follow him on an annual basis noninvasively.

## 2017-03-06 NOTE — Assessment & Plan Note (Signed)
History of essential hypertension blood pressure measured today at 150/74. He is on amlodipine, losartan, hydrochlorothiazide and metoprolol. Continue current meds at current dosing

## 2017-03-06 NOTE — Progress Notes (Signed)
03/06/2017 Robert Burns   Sep 11, 1940  [id number]  Primary Physician [name], MD Primary Cardiologist: Lorretta Harp MD [name], [name]  HPI:  Robert Burns is a 76 y.o.  married Caucasian male, father of 2, grandfather to 3 grandchildren whose wife Robert Burns who is also a patient of mine. I last saw him in the office 03/14/16.Robert Burns He has a history of CAD status post coronary artery bypass grafting March 2004 with a LIMA to his LAD, a vein to a diagonal branch, a sequential vein to a ramus and OM branch, as well as a vein to the PDA. Last functional study performed July 28, 2011, was entirely normal. 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 He had a pacemaker placed for sick sinus syndrome November 2008 followed by Dr. Sallyanne Kuster. He has obstructive sleep apnea on CPAP. He complain of left thigh pain and I angiogram'd him revealing patent arteries though he did have a space-occupying lesion which was removed by Dr. Kellie Simmering with placement of an interposition 6-mm Gore-Tex graft. The pathology was uncertain. 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 Dr. Baxter Flattery follows his lipid profile.Since I saw him back 11/07/12 he has done well. Followup Dopplers performed in our office 09/27/12 revealed a high-grade lesion in the distal right SFA with an ABI of 0.3. His left ABI 1.1 without obstructive disease. His most recent lower extremity Doppler Dopplers performed 9/32/16 revealed a right ABI 0.82 And a left ABI of 0.89. Over the last 3 months he's noticed anginal chest pain occurring both at rest and with exertion with left upper extremity radiation. He also complains of left lower extremity discomfort. to moderate anterolateral ischemia. Because of ongoing chest pain and a Myoview that showed anterolateral ischemia and he underwent cardiac catheterization on 02/01/15 revealing high grade segmental proximal right SVG disease and subtotally occluded diagonal  branch SVG. He underwent stenting of his RCA SVG successfully. He does have continued chest pain although somewhat improved since his last procedure. I brought him back for staged diagonal branch SVG intervention on 03/01/15. This was successful and since that time he denies chest pain or shortness of breath. He underwent a generator change by Dr. Sallyanne Kuster in January for end-of-life of his Medtronic pacemaker.Since I saw him 6 months ago he developed new left calf claudication of the last 3 months. Lower extremity arterial Doppler studies performed today revealed a decline in his left ABI from 0.87 down to 0.59 with an occluded left SFA that is a new finding. 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 He had a short segment occlusion of the proximal left SFA with patent mid left SFA stent. He had 90% segmental calcified mid right SFA stenosis with three-vessel runoff bilaterally. I ended up stenting his left external iliac artery with a 9 mm x 40 mm long nitinol self-expanding stent which improved his symptoms somewhat although he continuedto have lifestyle limiting claudication. He underwent staged intervention of his left SFA on 01/03/16. I stented the entire quadrant totally occluded segment with a 6 mm x 250 mm long Viabahn Stent. His claudication on the left has resolved and his ABIs normalized. He now complains of right leg medication. I did demonstrate a 90% calcified segmental mid right SFA stenosis at the time of angiography 11/25/15. He underwent diamondback orbital rotation atherectomy/drug-eluting  angioplasty of his mid calcified right SFA by myself 02/14/16. This follow-up Dopplers performed 02/24/16 revealed normal ABIs bilaterally. His claudication has improved. Since I saw him one year ago he's done well until several months ago when he noticed  new onset effort angina.     Current Meds  Medication Sig  . acetaminophen (TYLENOL) 500 MG tablet Take 500 mg by mouth every 6 (six) hours as needed for moderate pain or headache.   Robert Burns amLODipine (NORVASC) 10 MG tablet Take 10 mg by mouth daily.    Robert Burns aspirin EC 81 MG tablet Take 81 mg by mouth daily.    . B Complex Vitamins (B COMPLEX-B12 PO) Take 1 tablet by mouth daily.   . cholecalciferol (VITAMIN D) 1000 units tablet Take 1,000 Units by mouth 2 (two) times daily.   . cloNIDine (CATAPRES) 0.3 MG tablet Take 0.3 mg by mouth 2 (two) times daily.  . clopidogrel (PLAVIX) 75 MG tablet Take 1 tablet (75 mg total) by mouth daily with breakfast.  . Doxylamine Succinate, Sleep, (SLEEP AID PO) Take 1 tablet by mouth as needed (for sleep).   . gabapentin (NEURONTIN) 100 MG capsule Take 300 mg by mouth at bedtime.   Robert Burns glipiZIDE (GLUCOTROL) 5 MG tablet Take 10 mg by mouth daily.   . isosorbide mononitrate (IMDUR) 30 MG 24 hr tablet Take 1 tablet (30 mg total) by mouth daily.  Robert Burns losartan-hydrochlorothiazide (HYZAAR) 100-25 MG per tablet Take 1 tablet by mouth daily.    Robert Burns lovastatin (MEVACOR) 40 MG tablet Take 40 mg by mouth at bedtime.    . magnesium hydroxide (MILK OF MAGNESIA) 400 MG/5ML suspension Take 30 mLs by mouth as needed for moderate constipation or indigestion.   . metFORMIN (GLUCOPHAGE) 1000 MG tablet Take 1,000 mg by mouth 2 (two) times daily with a meal.    . metoprolol tartrate (LOPRESSOR) 50 MG tablet Take 1 tablet (50 mg total) by mouth 2 (two) times daily.  . Multiple Vitamin (MULTIVITAMIN) capsule Take 1 capsule by mouth daily.    . nitroGLYCERIN (NITROSTAT) 0.4 MG SL tablet Place 1 tablet (0.4 mg total) under the tongue every 5 (five) minutes as needed for chest pain.  . Omega-3 Fatty Acids (FISH OIL PO) Take 1 tablet by mouth daily.  . potassium chloride SA (K-DUR,KLOR-CON) 20 MEQ tablet Take 1 tablet (20 mEq total) by mouth daily.  . tamsulosin (FLOMAX) 0.4 MG CAPS capsule Take  0.4 mg by mouth 2 (two) times daily.   . vitamin C (ASCORBIC ACID) 500 MG tablet Take 500 mg by mouth 2 (two) times daily.       Allergies  Allergen Reactions  . Peanut-Containing Drug Products Anaphylaxis and Other (See Comments)    Tongue swelling is severe  . Ace Inhibitors Other (See Comments)    Cough  . Clonidine Hcl Other (See Comments)    Patch only - skin irritation  . Coreg [Carvedilol] Itching  . Diltiazem Hcl Other (See Comments)    Unknown  . Diltiazem Hcl Other (See Comments)    Unknown  . Mobic [Meloxicam] Other (See Comments)    GI upset  . Ramipril Other (See Comments)    Unknown    Social History   Socioeconomic History  . Marital status: Married    Spouse name: Not on file  . Number of children: Not on file  . Years of education: Not on file  . Highest education level: Not on file  Social Needs  . Financial  resource strain: Not on file  . Food insecurity - worry: Not on file  . Food insecurity - inability: Not on file  . Transportation needs - medical: Not on file  . Transportation needs - non-medical: Not on file  Occupational History  . Not on file  Tobacco Use  . Smoking status: Former Smoker    Years: 10.00    Types: Pipe    Last attempt to quit: 1973    Years since quitting: 45.9  . Smokeless tobacco: Never Used  . Tobacco comment: "quit smoking in 1973"  Substance and Sexual Activity  . Alcohol use: No  . Drug use: No  . Sexual activity: Not Currently  Other Topics Concern  . Not on file  Social History Narrative  . Not on file     Review of Systems: General: negative for chills, fever, night sweats or weight changes.  Cardiovascular: negative for chest pain, dyspnea on exertion, edema, orthopnea, palpitations, paroxysmal nocturnal dyspnea or shortness of breath Dermatological: negative for rash Respiratory: negative for cough or wheezing Urologic: negative for hematuria Abdominal: negative for nausea, vomiting, diarrhea, bright  red blood per rectum, melena, or hematemesis Neurologic: negative for visual changes, syncope, or dizziness All other systems reviewed and are otherwise negative except as noted above.    Blood pressure (!) 150/74, pulse 85, height 5\' 7"  (1.702 m), weight 183 lb 9.6 oz (83.3 kg).  General appearance: alert and no distress Neck: no adenopathy, no carotid bruit, no JVD, supple, symmetrical, trachea midline and thyroid not enlarged, symmetric, no tenderness/mass/nodules Lungs: clear to auscultation bilaterally Heart: regular rate and rhythm, S1, S2 normal, no murmur, click, rub or gallop Extremities: extremities normal, atraumatic, no cyanosis or edema Pulses: 2+ and symmetric Skin: Skin color, texture, turgor normal. No rashes or lesions Neurologic: Alert and oriented X 3, normal strength and tone. Normal symmetric reflexes. Normal coordination and gait  EKG not performed today  ASSESSMENT AND PLAN:   SSS (sick sinus syndrome), medtronic adapta History of sick sinus syndrome status post permanent transvenous pacemaker insertion 10/13/05 thought by Dr. Sallyanne Kuster.  Essential hypertension History of essential hypertension blood pressure measured today at 150/74. He is on amlodipine, losartan, hydrochlorothiazide and metoprolol. 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 His most recent Dopplers reveal patent stents with moderate in-stent restenosis. He really denies claudication. We'll continue to follow him on an annual basis noninvasively.  CAD -CABG 2004 LIMA-LAD, VG-Dx, seq VG -RI & OM branch; VG-PDA  History of CAD status post bypass grafting in March 2004. I didn't intervene on his RCA vein graft 02/01/15 and on his  diagonal branch vein graft 11/21 St./16. He was anginal free after that until several months ago when he developed new onset exertional chest pain consistent with his prior angina. I'm going to increase his Imdur from 30-60 mg a day and obtain a pharmacologic Myoview stress test to further evaluate.  Hyperlipidemia History of hyperlipidemia on statin therapy followed by his PCP      Lorretta Harp MD [facility], [facility] 03/06/2017 12:13 PM

## 2017-03-06 NOTE — Assessment & Plan Note (Signed)
History of CAD status post bypass grafting in March 2004. I didn't intervene on his RCA vein graft 02/01/15 and on his diagonal branch vein graft 11/21 St./16. He was anginal free after that until several months ago when he developed new onset exertional chest pain consistent with his prior angina. I'm going to increase his Imdur from 30-60 mg a day and obtain a pharmacologic Myoview stress test to further evaluate.

## 2017-03-08 ENCOUNTER — Telehealth (HOSPITAL_COMMUNITY): Payer: Self-pay

## 2017-03-08 LAB — CUP PACEART INCLINIC DEVICE CHECK
Brady Statistic AP VS Percent: 98 %
Brady Statistic AS VP Percent: 0 %
Brady Statistic AS VS Percent: 1 %
Date Time Interrogation Session: 20181030142951
Implantable Lead Implant Date: 20070706
Implantable Lead Location: 753860
Lead Channel Impedance Value: 701 Ohm
Lead Channel Pacing Threshold Amplitude: 0.875 V
Lead Channel Pacing Threshold Pulse Width: 0.4 ms
Lead Channel Pacing Threshold Pulse Width: 0.4 ms
MDC IDC LEAD IMPLANT DT: 20070706
MDC IDC LEAD LOCATION: 753859
MDC IDC MSMT BATTERY IMPEDANCE: 137 Ohm
MDC IDC MSMT BATTERY REMAINING LONGEVITY: 142 mo
MDC IDC MSMT BATTERY VOLTAGE: 2.8 V
MDC IDC MSMT LEADCHNL RA IMPEDANCE VALUE: 495 Ohm
MDC IDC MSMT LEADCHNL RV PACING THRESHOLD AMPLITUDE: 1.625 V
MDC IDC PG IMPLANT DT: 20170113
MDC IDC SET LEADCHNL RA PACING AMPLITUDE: 1.75 V
MDC IDC SET LEADCHNL RV PACING AMPLITUDE: 3.25 V
MDC IDC SET LEADCHNL RV PACING PULSEWIDTH: 0.4 ms
MDC IDC SET LEADCHNL RV SENSING SENSITIVITY: 5.6 mV
MDC IDC STAT BRADY AP VP PERCENT: 1 %

## 2017-03-08 NOTE — Telephone Encounter (Signed)
Encounter complete. 

## 2017-03-13 ENCOUNTER — Ambulatory Visit (HOSPITAL_COMMUNITY)
Admission: RE | Admit: 2017-03-13 | Discharge: 2017-03-13 | Disposition: A | Discharge status: Home / Self Care | Payer: Medicare HMO | Source: Ambulatory Visit | Attending: Cardiology | Admitting: Cardiology

## 2017-03-13 DIAGNOSIS — R079 Chest pain, unspecified: Secondary | ICD-10-CM | POA: Diagnosis not present

## 2017-03-13 LAB — MYOCARDIAL PERFUSION IMAGING
CHL CUP NUCLEAR SRS: 2
CHL CUP NUCLEAR SSS: 3
LV dias vol: 114 mL (ref 62–150)
LVSYSVOL: 66 mL
NUC STRESS TID: 1.01
Peak HR: 60 {beats}/min
Rest HR: 60 {beats}/min
SDS: 1

## 2017-03-13 MED ORDER — REGADENOSON 0.4 MG/5ML IV SOLN
0.4000 mg | Freq: Once | INTRAVENOUS | Status: AC
  Administered 2017-03-13: 0.4 mg via INTRAVENOUS

## 2017-03-13 MED ORDER — TECHNETIUM TC 99M TETROFOSMIN IV KIT
32.1000 | PACK | Freq: Once | INTRAVENOUS | Status: AC | PRN
  Administered 2017-03-13: 32.1 via INTRAVENOUS
  Filled 2017-03-13: qty 33

## 2017-03-13 MED ORDER — TECHNETIUM TC 99M TETROFOSMIN IV KIT
10.1000 | PACK | Freq: Once | INTRAVENOUS | Status: AC | PRN
  Administered 2017-03-13: 10.1 via INTRAVENOUS
  Filled 2017-03-13: qty 11

## 2017-03-16 ENCOUNTER — Other Ambulatory Visit: Payer: Self-pay | Admitting: Cardiovascular Disease

## 2017-03-16 ENCOUNTER — Telehealth (HOSPITAL_COMMUNITY): Payer: Self-pay | Admitting: Cardiovascular Disease

## 2017-03-16 DIAGNOSIS — I519 Heart disease, unspecified: Secondary | ICD-10-CM

## 2017-03-22 ENCOUNTER — Telehealth: Payer: Self-pay | Admitting: Cardiovascular Disease

## 2017-03-22 NOTE — Telephone Encounter (Signed)
User: Cherie Dark A Date/time: 03/21/17 3:49 PM  Comment: Called pt and lmsg for him to CB to get scheduled for an echo.   Context:  Outcome: Left Message  Phone number: [phone] Phone Type: Mobile  Comm. type: Telephone Call type: Outgoing  Contact: Batte, Leonie Man Relation to patient: Self    User: Cherie Dark A Date/time: 03/16/17 9:03 AM  Comment: Called pt and lmsg for him to CB to get scheduled for an echo.   Context:  Outcome: Left Message  Phone number: [phone] Phone Type: Mobile  Comm. type: Telephone Call type: Outgoing  Contact: Huffine, Leonie Man Relation to patient: Self

## 2017-03-22 NOTE — Telephone Encounter (Signed)
Called patient and LVM to call back to schedule his echo before 03-28-17.

## 2017-03-26 ENCOUNTER — Ambulatory Visit (HOSPITAL_COMMUNITY): Payer: Medicare HMO | Attending: Cardiovascular Disease

## 2017-03-26 ENCOUNTER — Other Ambulatory Visit: Payer: Self-pay

## 2017-03-26 DIAGNOSIS — G4733 Obstructive sleep apnea (adult) (pediatric): Secondary | ICD-10-CM | POA: Insufficient documentation

## 2017-03-26 DIAGNOSIS — Z87891 Personal history of nicotine dependence: Secondary | ICD-10-CM | POA: Insufficient documentation

## 2017-03-26 DIAGNOSIS — Z8249 Family history of ischemic heart disease and other diseases of the circulatory system: Secondary | ICD-10-CM | POA: Diagnosis not present

## 2017-03-26 DIAGNOSIS — I519 Heart disease, unspecified: Secondary | ICD-10-CM | POA: Diagnosis present

## 2017-03-26 DIAGNOSIS — E785 Hyperlipidemia, unspecified: Secondary | ICD-10-CM | POA: Diagnosis not present

## 2017-03-26 DIAGNOSIS — E119 Type 2 diabetes mellitus without complications: Secondary | ICD-10-CM | POA: Diagnosis not present

## 2017-03-26 DIAGNOSIS — Z951 Presence of aortocoronary bypass graft: Secondary | ICD-10-CM | POA: Diagnosis not present

## 2017-03-26 DIAGNOSIS — I10 Essential (primary) hypertension: Secondary | ICD-10-CM | POA: Diagnosis not present

## 2017-03-26 DIAGNOSIS — I2510 Atherosclerotic heart disease of native coronary artery without angina pectoris: Secondary | ICD-10-CM | POA: Insufficient documentation

## 2017-03-26 DIAGNOSIS — I272 Pulmonary hypertension, unspecified: Secondary | ICD-10-CM | POA: Insufficient documentation

## 2017-03-26 DIAGNOSIS — I119 Hypertensive heart disease without heart failure: Secondary | ICD-10-CM | POA: Insufficient documentation

## 2017-03-26 DIAGNOSIS — Z950 Presence of cardiac pacemaker: Secondary | ICD-10-CM | POA: Insufficient documentation

## 2017-03-28 ENCOUNTER — Ambulatory Visit: Payer: Medicare HMO | Admitting: Cardiovascular Disease

## 2017-03-28 ENCOUNTER — Encounter: Payer: Self-pay | Admitting: Cardiovascular Disease

## 2017-03-28 VITALS — BP 164/72 | HR 76 | Ht 67.0 in | Wt 186.2 lb

## 2017-03-28 DIAGNOSIS — R079 Chest pain, unspecified: Secondary | ICD-10-CM | POA: Diagnosis not present

## 2017-03-28 NOTE — Progress Notes (Signed)
03/28/2017 Robert Burns   May 15, 1940  [id number]  Primary Physician [name], MD Primary Cardiologist: Lorretta Harp MD [name], [name]  HPI:  Robert Burns is a 76 y.o.  married Caucasian male, father of 2, grandfather to 3 grandchildren whose wife [name] who is also a patient of mine. I last saw him in the office 03/06/17.[name] He has a history of CAD status post coronary artery bypass grafting March 2004 with a LIMA to his LAD, a vein to a diagonal branch, a sequential vein to a ramus and OM branch, as well as a vein to the PDA. Last functional study performed July 28, 2011, was entirely normal. 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 He had a pacemaker placed for sick sinus syndrome November 2008 followed by Dr. Sallyanne Kuster. He has obstructive sleep apnea on CPAP. He complain of left thigh pain and I angiogram'd him revealing patent arteries though he did have a space-occupying lesion which was removed by Dr. Kellie Simmering with placement of an interposition 6-mm Gore-Tex graft. The pathology was uncertain. 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 Dr. Baxter Flattery follows his lipid profile.Since I saw him back 11/07/12 he has done well. Followup Dopplers performed in our office 09/27/12 revealed a high-grade lesion in the distal right SFA with an ABI of 0.3. His left ABI 1.1 without obstructive disease. His most recent lower extremity Doppler Dopplers performed 9/32/16 revealed a right ABI 0.82 And a left ABI of 0.89. Over the last 3 months he's noticed anginal chest pain occurring both at rest and with exertion with left upper extremity radiation. He also complains of left lower extremity discomfort. to moderate anterolateral ischemia. Because of ongoing chest pain and a Myoview that showed anterolateral ischemia and he underwent cardiac catheterization on 02/01/15 revealing high grade segmental proximal right SVG disease and subtotally occluded diagonal  branch SVG. He underwent stenting of his RCA SVG successfully. He does have continued chest pain although somewhat improved since his last procedure. I brought him back for staged diagonal branch SVG intervention on 03/01/15. This was successful and since that time he denies chest pain or shortness of breath. He underwent a generator change by Dr. Sallyanne Kuster in January for end-of-life of his Medtronic pacemaker.Since I saw him 6 months ago he developed new left calf claudication of the last 3 months. Lower extremity arterial Doppler studies performed today revealed a decline in his left ABI from 0.87 down to 0.59 with an occluded left SFA that is a new finding. 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 He had a short segment occlusion of the proximal left SFA with patent mid left SFA stent. He had 90% segmental calcified mid right SFA stenosis with three-vessel runoff bilaterally. I ended up stenting his left external iliac artery with a 9 mm x 40 mm long nitinol self-expanding stent which improved his symptoms somewhat although he continuedto have lifestyle limiting claudication. He underwent staged intervention of his left SFA on 01/03/16. I stented the entire quadrant totally occluded segment with a 6 mm x 250 mm long Viabahn Stent. His claudication on the left has resolved and his ABIs normalized. He now complains of right leg medication. I did demonstrate a 90% calcified segmental mid right SFA stenosis at the time of angiography 11/25/15. He underwent diamondback orbital rotation atherectomy/drug-eluting  angioplasty of his mid calcified right SFA by myself 02/14/16. This follow-up Dopplers performed 02/24/16 revealed normal ABIs bilaterally. His claudication has improved. When I saw him in the office possibly 3 weeks ago he was complaining of fairly new  onset substernal chest pain over the prior several months occurring several times a week. These were new since his RCA and diagonal branch vein graft interventions at the end of 2016. He is on good antianginal medications. A Myoview stress test was ordered that showed subtle inferolateral ischemia and echo showed normal LV function..    Current Meds  Medication Sig  . acetaminophen (TYLENOL) 500 MG tablet Take 500 mg by mouth every 6 (six) hours as needed for moderate pain or headache.   [name] amLODipine (NORVASC) 10 MG tablet Take 10 mg by mouth daily.    [name] aspirin EC 81 MG tablet Take 81 mg by mouth daily.    . B Complex Vitamins (B COMPLEX-B12 PO) Take 1 tablet by mouth daily.   . cholecalciferol (VITAMIN D) 1000 units tablet Take 1,000 Units by mouth 2 (two) times daily.   . cloNIDine (CATAPRES) 0.3 MG tablet Take 0.3 mg by mouth 2 (two) times daily.  . clopidogrel (PLAVIX) 75 MG tablet Take 1 tablet (75 mg total) by mouth daily with breakfast.  . Doxylamine Succinate, Sleep, (SLEEP AID PO) Take 1 tablet by mouth as needed (for sleep).   . gabapentin (NEURONTIN) 100 MG capsule Take 300 mg by mouth at bedtime.   [name] glipiZIDE (GLUCOTROL) 5 MG tablet Take 10 mg by mouth daily.   . isosorbide mononitrate (IMDUR) 60 MG 24 hr tablet Take 1 tablet (60 mg total) by mouth daily.  [name] losartan-hydrochlorothiazide (HYZAAR) 100-25 MG per tablet Take 1 tablet by mouth daily.    [name] lovastatin (MEVACOR) 40 MG tablet Take 40 mg by mouth at bedtime.    . magnesium hydroxide (MILK OF MAGNESIA) 400 MG/5ML suspension Take 30 mLs by mouth as needed for moderate constipation or indigestion.   . metFORMIN (GLUCOPHAGE) 1000 MG tablet Take 1,000 mg by mouth 2 (two) times daily with a meal.    . metoprolol tartrate (LOPRESSOR) 50 MG tablet Take 1 tablet (50 mg total) by mouth 2 (two) times daily.  . Multiple Vitamin (MULTIVITAMIN) capsule Take 1 capsule by mouth daily.    . nitroGLYCERIN (NITROSTAT) 0.4 MG SL tablet Place  1 tablet (0.4 mg total) under the tongue every 5 (five) minutes as needed for chest pain.  . Omega-3 Fatty Acids (FISH OIL PO) Take 1 tablet by mouth daily.  . potassium chloride SA (K-DUR,KLOR-CON) 20 MEQ tablet Take 1 tablet (20 mEq total) by mouth daily.  . tamsulosin (FLOMAX) 0.4 MG CAPS capsule Take 0.4 mg by mouth 2 (two) times daily.   . vitamin C (ASCORBIC ACID) 500 MG tablet Take 500 mg by mouth 2 (two) times daily.       Allergies  Allergen Reactions  . Peanut-Containing Drug Products Anaphylaxis and Other (See Comments)    Tongue swelling is severe  . Ace Inhibitors Other (See Comments)    Cough  . Clonidine Hcl Other (See Comments)    Patch only - skin irritation  . Coreg [Carvedilol] Itching  . Diltiazem Hcl Other (See Comments)    Unknown  . Diltiazem Hcl Other (See Comments)    Unknown  . Mobic [Meloxicam] Other (See Comments)    GI upset  . Ramipril Other (See Comments)    Unknown  Social History   Socioeconomic History  . Marital status: Married    Spouse name: Not on file  . Number of children: Not on file  . Years of education: Not on file  . Highest education level: Not on file  Social Needs  . Financial resource strain: Not on file  . Food insecurity - worry: Not on file  . Food insecurity - inability: Not on file  . Transportation needs - medical: Not on file  . Transportation needs - non-medical: Not on file  Occupational History  . Not on file  Tobacco Use  . Smoking status: Former Smoker    Years: 10.00    Types: Pipe    Last attempt to quit: 1973    Years since quitting: 45.9  . Smokeless tobacco: Never Used  . Tobacco comment: "quit smoking in 1973"  Substance and Sexual Activity  . Alcohol use: No  . Drug use: No  . Sexual activity: Not Currently  Other Topics Concern  . Not on file  Social History Narrative  . Not on file     Review of Systems: General: negative for chills, fever, night sweats or weight changes.    Cardiovascular: negative for chest pain, dyspnea on exertion, edema, orthopnea, palpitations, paroxysmal nocturnal dyspnea or shortness of breath Dermatological: negative for rash Respiratory: negative for cough or wheezing Urologic: negative for hematuria Abdominal: negative for nausea, vomiting, diarrhea, bright red blood per rectum, melena, or hematemesis Neurologic: negative for visual changes, syncope, or dizziness All other systems reviewed and are otherwise negative except as noted above.    Blood pressure (!) 164/72, pulse 76, height 5\' 7"  (1.702 m), weight 186 lb 3.2 oz (84.5 kg).  General appearance: alert and no distress Neck: no adenopathy, no carotid bruit, no JVD, supple, symmetrical, trachea midline and thyroid not enlarged, symmetric, no tenderness/mass/nodules Lungs: clear to auscultation bilaterally Heart: regular rate and rhythm, S1, S2 normal, no murmur, click, rub or gallop Extremities: extremities normal, atraumatic, no cyanosis or edema Pulses: 2+ and symmetric Skin: Skin color, texture, turgor normal. No rashes or lesions Neurologic: Alert and oriented X 3, normal strength and tone. Normal symmetric reflexes. Normal coordination and gait  EKG not performed today  ASSESSMENT AND PLAN:   CAD -CABG 2004 LIMA-LAD, VG-Dx, seq VG -RI & OM branch; VG-PDA  Robert Burns returns today for follow-up of his noninvasive tests. He'll Myoview stress test that was read as low risk but did have subtle inferolateral ischemia and echo that showed low normal EF. His last intervention by myself was in 2016 when I fixed his RCA vein graft in October and his diagonal vein graft in November. He did have a sequential circumflex obtuse marginal vein graft that had a lesion in the continuation in the 50-60% range. His Myoview shows subtle inferolateral ischemia. His onset of chest pain occurred. 4 months ago and now occurs 3-4 times a week. On good antianginal medications. We decided to proceed  with outpatient diagnostic coronary angiography on January 3. The patient understands that risks included but are not limited to stroke (1 in 1000), death (1 in 59), kidney failure [usually temporary] (1 in 500), bleeding (1 in 200), allergic reaction [possibly serious] (1 in 200). The patient understands and agrees to proceed      Lorretta Harp MD [facility], [facility] 03/28/2017 11:08 AM

## 2017-03-28 NOTE — H&P (View-Only) (Signed)
03/28/2017 Robert Burns   1940/06/09  [id number]  Primary Physician [name], MD Primary Cardiologist: Lorretta Harp MD Robert Burns, [name]  HPI:  Robert Burns is a 76 y.o.  married Caucasian male, father of 2, grandfather to 3 grandchildren whose wife Robert Burns who is also a patient of mine. I last saw him in the office 03/06/17.[name] He has a history of CAD status post coronary artery bypass grafting March 2004 with a LIMA to his LAD, a vein to a diagonal branch, a sequential vein to a ramus and OM branch, as well as a vein to the PDA. Last functional study performed July 28, 2011, was entirely normal. 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 He had a pacemaker placed for sick sinus syndrome November 2008 followed by Dr. Sallyanne Kuster. He has obstructive sleep apnea on CPAP. He complain of left thigh pain and I angiogram'd him revealing patent arteries though he did have a space-occupying lesion which was removed by Dr. Kellie Simmering with placement of an interposition 6-mm Gore-Tex graft. The pathology was uncertain. 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 Dr. Baxter Flattery follows his lipid profile.Since I saw him back 11/07/12 he has done well. Followup Dopplers performed in our office 09/27/12 revealed a high-grade lesion in the distal right SFA with an ABI of 0.3. His left ABI 1.1 without obstructive disease. His most recent lower extremity Doppler Dopplers performed 9/32/16 revealed a right ABI 0.82 And a left ABI of 0.89. Over the last 3 months he's noticed anginal chest pain occurring both at rest and with exertion with left upper extremity radiation. He also complains of left lower extremity discomfort. to moderate anterolateral ischemia. Because of ongoing chest pain and a Myoview that showed anterolateral ischemia and he underwent cardiac catheterization on 02/01/15 revealing high grade segmental proximal right SVG disease and subtotally occluded diagonal  branch SVG. He underwent stenting of his RCA SVG successfully. He does have continued chest pain although somewhat improved since his last procedure. I brought him back for staged diagonal branch SVG intervention on 03/01/15. This was successful and since that time he denies chest pain or shortness of breath. He underwent a generator change by Dr. Sallyanne Kuster in January for end-of-life of his Medtronic pacemaker.Since I saw him 6 months ago he developed new left calf claudication of the last 3 months. Lower extremity arterial Doppler studies performed today revealed a decline in his left ABI from 0.87 down to 0.59 with an occluded left SFA that is a new finding. 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 He had a short segment occlusion of the proximal left SFA with patent mid left SFA stent. He had 90% segmental calcified mid right SFA stenosis with three-vessel runoff bilaterally. I ended up stenting his left external iliac artery with a 9 mm x 40 mm long nitinol self-expanding stent which improved his symptoms somewhat although he continuedto have lifestyle limiting claudication. He underwent staged intervention of his left SFA on 01/03/16. I stented the entire quadrant totally occluded segment with a 6 mm x 250 mm long Viabahn Stent. His claudication on the left has resolved and his ABIs normalized. He now complains of right leg medication. I did demonstrate a 90% calcified segmental mid right SFA stenosis at the time of angiography 11/25/15. He underwent diamondback orbital rotation atherectomy/drug-eluting  angioplasty of his mid calcified right SFA by myself 02/14/16. This follow-up Dopplers performed 02/24/16 revealed normal ABIs bilaterally. His claudication has improved. When I saw him in the office possibly 3 weeks ago he was complaining of fairly new  onset substernal chest pain over the prior several months occurring several times a week. These were new since his RCA and diagonal branch vein graft interventions at the end of 2016. He is on good antianginal medications. A Myoview stress test was ordered that showed subtle inferolateral ischemia and echo showed normal LV function..    Current Meds  Medication Sig  . acetaminophen (TYLENOL) 500 MG tablet Take 500 mg by mouth every 6 (six) hours as needed for moderate pain or headache.   [name] amLODipine (NORVASC) 10 MG tablet Take 10 mg by mouth daily.    [name] aspirin EC 81 MG tablet Take 81 mg by mouth daily.    . B Complex Vitamins (B COMPLEX-B12 PO) Take 1 tablet by mouth daily.   . cholecalciferol (VITAMIN D) 1000 units tablet Take 1,000 Units by mouth 2 (two) times daily.   . cloNIDine (CATAPRES) 0.3 MG tablet Take 0.3 mg by mouth 2 (two) times daily.  . clopidogrel (PLAVIX) 75 MG tablet Take 1 tablet (75 mg total) by mouth daily with breakfast.  . Doxylamine Succinate, Sleep, (SLEEP AID PO) Take 1 tablet by mouth as needed (for sleep).   . gabapentin (NEURONTIN) 100 MG capsule Take 300 mg by mouth at bedtime.   [name] glipiZIDE (GLUCOTROL) 5 MG tablet Take 10 mg by mouth daily.   . isosorbide mononitrate (IMDUR) 60 MG 24 hr tablet Take 1 tablet (60 mg total) by mouth daily.  [name] losartan-hydrochlorothiazide (HYZAAR) 100-25 MG per tablet Take 1 tablet by mouth daily.    [name] lovastatin (MEVACOR) 40 MG tablet Take 40 mg by mouth at bedtime.    . magnesium hydroxide (MILK OF MAGNESIA) 400 MG/5ML suspension Take 30 mLs by mouth as needed for moderate constipation or indigestion.   . metFORMIN (GLUCOPHAGE) 1000 MG tablet Take 1,000 mg by mouth 2 (two) times daily with a meal.    . metoprolol tartrate (LOPRESSOR) 50 MG tablet Take 1 tablet (50 mg total) by mouth 2 (two) times daily.  . Multiple Vitamin (MULTIVITAMIN) capsule Take 1 capsule by mouth daily.    . nitroGLYCERIN (NITROSTAT) 0.4 MG SL tablet Place  1 tablet (0.4 mg total) under the tongue every 5 (five) minutes as needed for chest pain.  . Omega-3 Fatty Acids (FISH OIL PO) Take 1 tablet by mouth daily.  . potassium chloride SA (K-DUR,KLOR-CON) 20 MEQ tablet Take 1 tablet (20 mEq total) by mouth daily.  . tamsulosin (FLOMAX) 0.4 MG CAPS capsule Take 0.4 mg by mouth 2 (two) times daily.   . vitamin C (ASCORBIC ACID) 500 MG tablet Take 500 mg by mouth 2 (two) times daily.       Allergies  Allergen Reactions  . Peanut-Containing Drug Products Anaphylaxis and Other (See Comments)    Tongue swelling is severe  . Ace Inhibitors Other (See Comments)    Cough  . Clonidine Hcl Other (See Comments)    Patch only - skin irritation  . Coreg [Carvedilol] Itching  . Diltiazem Hcl Other (See Comments)    Unknown  . Diltiazem Hcl Other (See Comments)    Unknown  . Mobic [Meloxicam] Other (See Comments)    GI upset  . Ramipril Other (See Comments)    Unknown  Social History   Socioeconomic History  . Marital status: Married    Spouse name: Not on file  . Number of children: Not on file  . Years of education: Not on file  . Highest education level: Not on file  Social Needs  . Financial resource strain: Not on file  . Food insecurity - worry: Not on file  . Food insecurity - inability: Not on file  . Transportation needs - medical: Not on file  . Transportation needs - non-medical: Not on file  Occupational History  . Not on file  Tobacco Use  . Smoking status: Former Smoker    Years: 10.00    Types: Pipe    Last attempt to quit: 1973    Years since quitting: 45.9  . Smokeless tobacco: Never Used  . Tobacco comment: "quit smoking in 1973"  Substance and Sexual Activity  . Alcohol use: No  . Drug use: No  . Sexual activity: Not Currently  Other Topics Concern  . Not on file  Social History Narrative  . Not on file     Review of Systems: General: negative for chills, fever, night sweats or weight changes.    Cardiovascular: negative for chest pain, dyspnea on exertion, edema, orthopnea, palpitations, paroxysmal nocturnal dyspnea or shortness of breath Dermatological: negative for rash Respiratory: negative for cough or wheezing Urologic: negative for hematuria Abdominal: negative for nausea, vomiting, diarrhea, bright red blood per rectum, melena, or hematemesis Neurologic: negative for visual changes, syncope, or dizziness All other systems reviewed and are otherwise negative except as noted above.    Blood pressure (!) 164/72, pulse 76, height 5\' 7"  (1.702 m), weight 186 lb 3.2 oz (84.5 kg).  General appearance: alert and no distress Neck: no adenopathy, no carotid bruit, no JVD, supple, symmetrical, trachea midline and thyroid not enlarged, symmetric, no tenderness/mass/nodules Lungs: clear to auscultation bilaterally Heart: regular rate and rhythm, S1, S2 normal, no murmur, click, rub or gallop Extremities: extremities normal, atraumatic, no cyanosis or edema Pulses: 2+ and symmetric Skin: Skin color, texture, turgor normal. No rashes or lesions Neurologic: Alert and oriented X 3, normal strength and tone. Normal symmetric reflexes. Normal coordination and gait  EKG not performed today  ASSESSMENT AND PLAN:   CAD -CABG 2004 LIMA-LAD, VG-Dx, seq VG -RI & OM branch; VG-PDA  Mr. Wesch returns today for follow-up of his noninvasive tests. He'll Myoview stress test that was read as low risk but did have subtle inferolateral ischemia and echo that showed low normal EF. His last intervention by myself was in 2016 when I fixed his RCA vein graft in October and his diagonal vein graft in November. He did have a sequential circumflex obtuse marginal vein graft that had a lesion in the continuation in the 50-60% range. His Myoview shows subtle inferolateral ischemia. His onset of chest pain occurred. 4 months ago and now occurs 3-4 times a week. On good antianginal medications. We decided to proceed  with outpatient diagnostic coronary angiography on January 3. The patient understands that risks included but are not limited to stroke (1 in 1000), death (1 in 64), kidney failure [usually temporary] (1 in 500), bleeding (1 in 200), allergic reaction [possibly serious] (1 in 200). The patient understands and agrees to proceed      Lorretta Harp MD [facility], [facility] 03/28/2017 11:08 AM

## 2017-03-28 NOTE — Assessment & Plan Note (Signed)
Mr. Berkowitz returns today for follow-up of his noninvasive tests. He'll Myoview stress test that was read as low risk but did have subtle inferolateral ischemia and echo that showed low normal EF. His last intervention by myself was in 2016 when I fixed his RCA vein graft in October and his diagonal vein graft in November. He did have a sequential circumflex obtuse marginal vein graft that had a lesion in the continuation in the 50-60% range. His Myoview shows subtle inferolateral ischemia. His onset of chest pain occurred. 4 months ago and now occurs 3-4 times a week. On good antianginal medications. We decided to proceed with outpatient diagnostic coronary angiography on January 3. The patient understands that risks included but are not limited to stroke (1 in 1000), death (1 in 81), kidney failure [usually temporary] (1 in 500), bleeding (1 in 200), allergic reaction [possibly serious] (1 in 200). The patient understands and agrees to proceed

## 2017-03-28 NOTE — Patient Instructions (Signed)
   [facility] [address] [place] [address] Dept: [phone] Loc: [phone]  Robert Burns  03/28/2017  You are scheduled for a Cardiac Catheterization on Thursday, January 3 with Dr. Quay Burow.  1. Please arrive at the [facility] (Main Entrance A) at [facility]: [address] [place], [place] [address] at 5:30 AM (two hours before your procedure to ensure your preparation). Free valet parking service is available.   Special note: Every effort is made to have your procedure done on time. Please understand that emergencies sometimes delay scheduled procedures.  2. Diet: Do not eat or drink anything after midnight prior to your procedure except sips of water to take medications.  3. Labs: Please have labs drawn in the office today.  --A chest x-ray takes a picture of the organs and structures inside the chest, including the heart, lungs, and blood vessels. This test can show several things, including, whether the heart is enlarges; whether fluid is building up in the lungs; and whether pacemaker / defibrillator leads are still in place. Please have this done between now and up to 3-4 days prior to your Cath.   4. Medication instructions in preparation for your procedure:  Stop taking, Glucophage (Metformin) on Wednesday, January 2.    Please HOLD all other diabetic medications the morning of your procedure.  On the morning of your procedure, take your Plavix/Clopidogrel and any morning medicines NOT listed above.  You may use sips of water.  5. Plan for one night stay--bring personal belongings. 6. Bring a current list of your medications and current insurance cards. 7. You MUST have a responsible person to drive you home. 8. Someone MUST be with you the first 24 hours after you arrive home or your discharge will be delayed. 9. Please wear clothes that are easy to get  on and off and wear slip-on shoes.  Thank you for allowing [place] to care for you!   -- [facility]

## 2017-03-29 ENCOUNTER — Encounter: Payer: Self-pay | Admitting: Cardiovascular Disease

## 2017-03-29 ENCOUNTER — Ambulatory Visit
Admission: RE | Admit: 2017-03-29 | Discharge: 2017-03-29 | Disposition: A | Discharge status: Home / Self Care | Payer: Medicare HMO | Source: Ambulatory Visit | Attending: Cardiovascular Disease | Admitting: Cardiovascular Disease

## 2017-03-29 DIAGNOSIS — R079 Chest pain, unspecified: Secondary | ICD-10-CM

## 2017-03-29 LAB — BASIC METABOLIC PANEL
BUN / CREAT RATIO: 22 (ref 10–24)
BUN: 16 mg/dL (ref 8–27)
CHLORIDE: 102 mmol/L (ref 96–106)
CO2: 23 mmol/L (ref 20–29)
Calcium: 9.2 mg/dL (ref 8.6–10.2)
Creatinine, Ser: 0.74 mg/dL — ABNORMAL LOW (ref 0.76–1.27)
GFR, EST AFRICAN AMERICAN: 102 mL/min/{1.73_m2} (ref >59)
GFR, EST NON AFRICAN AMERICAN: 88 mL/min/{1.73_m2} (ref >59)
Glucose: 229 mg/dL — ABNORMAL HIGH (ref 65–99)
POTASSIUM: 4.2 mmol/L (ref 3.5–5.2)
Sodium: 140 mmol/L (ref 134–144)

## 2017-03-29 LAB — CBC WITH DIFFERENTIAL/PLATELET
BASOS ABS: 0.1 10*3/uL (ref 0.0–0.2)
Basos: 1 %
EOS (ABSOLUTE): 0.2 10*3/uL (ref 0.0–0.4)
Eos: 2 %
HEMOGLOBIN: 13.9 g/dL (ref 13.0–17.7)
Hematocrit: 40.8 % (ref 37.5–51.0)
Immature Grans (Abs): 0 10*3/uL (ref 0.0–0.1)
Immature Granulocytes: 0 %
LYMPHS ABS: 4.5 10*3/uL — AB (ref 0.7–3.1)
Lymphs: 42 %
MCH: 28 pg (ref 26.6–33.0)
MCHC: 34.1 g/dL (ref 31.5–35.7)
MCV: 82 fL (ref 79–97)
MONOCYTES: 7 %
Monocytes Absolute: 0.8 10*3/uL (ref 0.1–0.9)
NEUTROS ABS: 5.3 10*3/uL (ref 1.4–7.0)
Neutrophils: 48 %
Platelets: 178 10*3/uL (ref 150–379)
RBC: 4.96 x10E6/uL (ref 4.14–5.80)
RDW: 13.9 % (ref 12.3–15.4)
WBC: 10.9 10*3/uL — ABNORMAL HIGH (ref 3.4–10.8)

## 2017-03-29 LAB — PROTIME-INR
INR: 1 (ref 0.8–1.2)
Prothrombin Time: 10.6 s (ref 9.1–12.0)

## 2017-03-29 LAB — TSH: TSH: 0.964 u[IU]/mL (ref 0.450–4.500)

## 2017-03-29 LAB — APTT: aPTT: 27 s (ref 24–33)

## 2017-03-30 ENCOUNTER — Telehealth: Payer: Self-pay | Admitting: Cardiovascular Disease

## 2017-03-30 NOTE — Telephone Encounter (Signed)
Left detailed message, ok per DPR, stating time change for cath scheduled on 04/12/17. Time changed from 7:30 am to 1:00 pm, pt to arrive at 11:00 am.

## 2017-04-04 ENCOUNTER — Telehealth: Payer: Self-pay | Admitting: Cardiovascular Disease

## 2017-04-04 NOTE — Telephone Encounter (Signed)
Left detailed message.   

## 2017-04-04 NOTE — Telephone Encounter (Signed)
New Message     Pt c/o medication issue:  1. Name of Medication: isosorbide  2. How are you currently taking this medication (dosage and times per day)? 30mg   3. Are you having a reaction (difficulty breathing--STAT)? no  4. What is your medication issue? Patient is having procedure on 04/12/2017 and calling to see if he needs to go up to 60mg  prior to procedure.

## 2017-04-05 ENCOUNTER — Ambulatory Visit (INDEPENDENT_AMBULATORY_CARE_PROVIDER_SITE_OTHER): Payer: Medicare HMO | Admitting: *Deleted

## 2017-04-05 DIAGNOSIS — I495 Sick sinus syndrome: Secondary | ICD-10-CM | POA: Diagnosis not present

## 2017-04-05 NOTE — Progress Notes (Signed)
Remote pacemaker transmission.   

## 2017-04-06 ENCOUNTER — Encounter: Payer: Self-pay | Admitting: Cardiology

## 2017-04-07 LAB — CUP PACEART REMOTE DEVICE CHECK
Battery Impedance: 137 Ohm
Battery Voltage: 2.8 V
Brady Statistic AS VS Percent: 1 %
Implantable Lead Implant Date: 20070706
Implantable Lead Location: 753859
Implantable Lead Model: 5076
Lead Channel Impedance Value: 467 Ohm
Lead Channel Pacing Threshold Pulse Width: 0.4 ms
Lead Channel Sensing Intrinsic Amplitude: 16 mV
Lead Channel Setting Pacing Amplitude: 1.75 V
Lead Channel Setting Pacing Amplitude: 3.5 V
Lead Channel Setting Sensing Sensitivity: 5.6 mV
MDC IDC LEAD IMPLANT DT: 20070706
MDC IDC LEAD LOCATION: 753860
MDC IDC MSMT BATTERY REMAINING LONGEVITY: 140 mo
MDC IDC MSMT LEADCHNL RA PACING THRESHOLD AMPLITUDE: 0.875 V
MDC IDC MSMT LEADCHNL RA PACING THRESHOLD PULSEWIDTH: 0.4 ms
MDC IDC MSMT LEADCHNL RV IMPEDANCE VALUE: 682 Ohm
MDC IDC MSMT LEADCHNL RV PACING THRESHOLD AMPLITUDE: 1.75 V
MDC IDC PG IMPLANT DT: 20170113
MDC IDC SESS DTM: 20181227175815
MDC IDC SET LEADCHNL RV PACING PULSEWIDTH: 0.4 ms
MDC IDC STAT BRADY AP VP PERCENT: 1 %
MDC IDC STAT BRADY AP VS PERCENT: 99 %
MDC IDC STAT BRADY AS VP PERCENT: 0 %

## 2017-04-09 ENCOUNTER — Other Ambulatory Visit: Payer: Self-pay | Admitting: Cardiovascular Disease

## 2017-04-09 DIAGNOSIS — R072 Precordial pain: Secondary | ICD-10-CM

## 2017-04-11 ENCOUNTER — Telehealth: Payer: Self-pay | Admitting: Cardiovascular Disease

## 2017-04-11 NOTE — Telephone Encounter (Signed)
Left detailed message--ok per DPR--informing pt of procedure time change for 04/12/17 due to cancellation. Time changed from 1 pm to 1100 am, pt to arrive at [facility] at 900 am.

## 2017-04-12 ENCOUNTER — Encounter (HOSPITAL_COMMUNITY): Payer: Self-pay | Admitting: *Deleted

## 2017-04-12 ENCOUNTER — Encounter (HOSPITAL_COMMUNITY)
Admission: RE | Disposition: A | Discharge status: Home / Self Care | Payer: Self-pay | Source: Ambulatory Visit | Attending: Cardiovascular Disease

## 2017-04-12 ENCOUNTER — Other Ambulatory Visit: Payer: Self-pay

## 2017-04-12 ENCOUNTER — Inpatient Hospital Stay (HOSPITAL_COMMUNITY)
Admission: RE | Admit: 2017-04-12 | Discharge: 2017-04-15 | DRG: 246 | Disposition: A | Discharge status: Home / Self Care | Payer: Medicare HMO | Source: Ambulatory Visit | Attending: Cardiovascular Disease | Admitting: Cardiovascular Disease

## 2017-04-12 DIAGNOSIS — I25710 Atherosclerosis of autologous vein coronary artery bypass graft(s) with unstable angina pectoris: Secondary | ICD-10-CM

## 2017-04-12 DIAGNOSIS — I25110 Atherosclerotic heart disease of native coronary artery with unstable angina pectoris: Secondary | ICD-10-CM | POA: Diagnosis present

## 2017-04-12 DIAGNOSIS — I10 Essential (primary) hypertension: Secondary | ICD-10-CM | POA: Diagnosis present

## 2017-04-12 DIAGNOSIS — I214 Non-ST elevation (NSTEMI) myocardial infarction: Secondary | ICD-10-CM

## 2017-04-12 DIAGNOSIS — E1140 Type 2 diabetes mellitus with diabetic neuropathy, unspecified: Secondary | ICD-10-CM | POA: Diagnosis present

## 2017-04-12 DIAGNOSIS — E876 Hypokalemia: Secondary | ICD-10-CM | POA: Diagnosis present

## 2017-04-12 DIAGNOSIS — Z888 Allergy status to other drugs, medicaments and biological substances status: Secondary | ICD-10-CM

## 2017-04-12 DIAGNOSIS — I739 Peripheral vascular disease, unspecified: Secondary | ICD-10-CM | POA: Diagnosis present

## 2017-04-12 DIAGNOSIS — E1151 Type 2 diabetes mellitus with diabetic peripheral angiopathy without gangrene: Secondary | ICD-10-CM | POA: Diagnosis present

## 2017-04-12 DIAGNOSIS — T82858A Stenosis of vascular prosthetic devices, implants and grafts, initial encounter: Secondary | ICD-10-CM | POA: Diagnosis present

## 2017-04-12 DIAGNOSIS — E785 Hyperlipidemia, unspecified: Secondary | ICD-10-CM | POA: Diagnosis present

## 2017-04-12 DIAGNOSIS — Z9989 Dependence on other enabling machines and devices: Secondary | ICD-10-CM

## 2017-04-12 DIAGNOSIS — Z794 Long term (current) use of insulin: Secondary | ICD-10-CM

## 2017-04-12 DIAGNOSIS — G4440 Drug-induced headache, not elsewhere classified, not intractable: Secondary | ICD-10-CM | POA: Diagnosis not present

## 2017-04-12 DIAGNOSIS — Z955 Presence of coronary angioplasty implant and graft: Secondary | ICD-10-CM

## 2017-04-12 DIAGNOSIS — R072 Precordial pain: Secondary | ICD-10-CM

## 2017-04-12 DIAGNOSIS — Z91010 Allergy to peanuts: Secondary | ICD-10-CM

## 2017-04-12 DIAGNOSIS — G4733 Obstructive sleep apnea (adult) (pediatric): Secondary | ICD-10-CM

## 2017-04-12 DIAGNOSIS — I97790 Other intraoperative cardiac functional disturbances during cardiac surgery: Principal | ICD-10-CM | POA: Diagnosis present

## 2017-04-12 DIAGNOSIS — E118 Type 2 diabetes mellitus with unspecified complications: Secondary | ICD-10-CM

## 2017-04-12 DIAGNOSIS — I21A9 Other myocardial infarction type: Secondary | ICD-10-CM | POA: Diagnosis present

## 2017-04-12 DIAGNOSIS — I495 Sick sinus syndrome: Secondary | ICD-10-CM | POA: Diagnosis present

## 2017-04-12 DIAGNOSIS — Z7982 Long term (current) use of aspirin: Secondary | ICD-10-CM

## 2017-04-12 DIAGNOSIS — E1169 Type 2 diabetes mellitus with other specified complication: Secondary | ICD-10-CM | POA: Diagnosis present

## 2017-04-12 DIAGNOSIS — Y712 Prosthetic and other implants, materials and accessory cardiovascular devices associated with adverse incidents: Secondary | ICD-10-CM | POA: Diagnosis present

## 2017-04-12 DIAGNOSIS — I200 Unstable angina: Secondary | ICD-10-CM | POA: Diagnosis present

## 2017-04-12 DIAGNOSIS — Z87891 Personal history of nicotine dependence: Secondary | ICD-10-CM

## 2017-04-12 DIAGNOSIS — Z7902 Long term (current) use of antithrombotics/antiplatelets: Secondary | ICD-10-CM

## 2017-04-12 DIAGNOSIS — T463X5A Adverse effect of coronary vasodilators, initial encounter: Secondary | ICD-10-CM | POA: Diagnosis not present

## 2017-04-12 DIAGNOSIS — I2510 Atherosclerotic heart disease of native coronary artery without angina pectoris: Secondary | ICD-10-CM | POA: Diagnosis present

## 2017-04-12 DIAGNOSIS — R9439 Abnormal result of other cardiovascular function study: Secondary | ICD-10-CM | POA: Diagnosis present

## 2017-04-12 DIAGNOSIS — Z950 Presence of cardiac pacemaker: Secondary | ICD-10-CM

## 2017-04-12 DIAGNOSIS — Z79899 Other long term (current) drug therapy: Secondary | ICD-10-CM

## 2017-04-12 HISTORY — PX: LEFT HEART CATH AND CORS/GRAFTS ANGIOGRAPHY: CATH118250

## 2017-04-12 HISTORY — PX: CORONARY STENT INTERVENTION: CATH118234

## 2017-04-12 LAB — GLUCOSE, CAPILLARY
GLUCOSE-CAPILLARY: 160 mg/dL — AB (ref 65–99)
GLUCOSE-CAPILLARY: 162 mg/dL — AB (ref 65–99)
Glucose-Capillary: 102 mg/dL — ABNORMAL HIGH (ref 65–99)

## 2017-04-12 LAB — POCT ACTIVATED CLOTTING TIME
ACTIVATED CLOTTING TIME: 362 s
ACTIVATED CLOTTING TIME: 153 s
Activated Clotting Time: 180 seconds
Activated Clotting Time: 180 seconds

## 2017-04-12 LAB — TROPONIN I: Troponin I: 0.34 ng/mL (ref <0.03)

## 2017-04-12 LAB — MRSA PCR SCREENING: MRSA by PCR: NEGATIVE

## 2017-04-12 SURGERY — LEFT HEART CATH AND CORS/GRAFTS ANGIOGRAPHY
Anesthesia: LOCAL

## 2017-04-12 MED ORDER — FENTANYL CITRATE (PF) 100 MCG/2ML IJ SOLN
INTRAMUSCULAR | Status: AC
  Filled 2017-04-12: qty 2

## 2017-04-12 MED ORDER — BIVALIRUDIN TRIFLUOROACETATE 250 MG IV SOLR
INTRAVENOUS | Status: AC
  Filled 2017-04-12: qty 250

## 2017-04-12 MED ORDER — IODIXANOL 320 MG/ML IV SOLN
INTRAVENOUS | Status: DC | PRN
  Administered 2017-04-12: 170 mL via INTRA_ARTERIAL

## 2017-04-12 MED ORDER — MORPHINE SULFATE (PF) 10 MG/ML IV SOLN: 2.0000 mg | INTRAVENOUS | Status: DC | PRN

## 2017-04-12 MED ORDER — NITROGLYCERIN 1 MG/10 ML FOR IR/CATH LAB
INTRA_ARTERIAL | Status: AC
  Filled 2017-04-12: qty 10

## 2017-04-12 MED ORDER — SODIUM CHLORIDE 0.9 % IV SOLN
INTRAVENOUS | Status: AC
  Administered 2017-04-12: 20:00:00 via INTRAVENOUS

## 2017-04-12 MED ORDER — NITROGLYCERIN IN D5W 200-5 MCG/ML-% IV SOLN
INTRAVENOUS | Status: AC | PRN
  Administered 2017-04-12: 5 ug/min via INTRAVENOUS

## 2017-04-12 MED ORDER — HEPARIN (PORCINE) IN NACL 2-0.9 UNIT/ML-% IJ SOLN
INTRAMUSCULAR | Status: AC | PRN
  Administered 2017-04-12: 1000 mL

## 2017-04-12 MED ORDER — LOSARTAN POTASSIUM 50 MG PO TABS
100.0000 mg | ORAL_TABLET | Freq: Every day | ORAL | Status: DC
  Administered 2017-04-13 – 2017-04-15 (×3): 100 mg via ORAL
  Filled 2017-04-12 (×3): qty 2

## 2017-04-12 MED ORDER — LIDOCAINE HCL (PF) 1 % IJ SOLN
INTRAMUSCULAR | Status: AC
  Filled 2017-04-12: qty 30

## 2017-04-12 MED ORDER — NITROGLYCERIN 1 MG/10 ML FOR IR/CATH LAB
INTRA_ARTERIAL | Status: DC | PRN
  Administered 2017-04-12: 200 ug via INTRACORONARY

## 2017-04-12 MED ORDER — ASPIRIN EC 81 MG PO TBEC
81.0000 mg | DELAYED_RELEASE_TABLET | Freq: Every evening | ORAL | Status: DC
  Administered 2017-04-13 – 2017-04-14 (×2): 81 mg via ORAL
  Filled 2017-04-12 (×2): qty 1

## 2017-04-12 MED ORDER — ACETAMINOPHEN 325 MG PO TABS: 650.0000 mg | ORAL_TABLET | ORAL | Status: DC | PRN

## 2017-04-12 MED ORDER — SODIUM CHLORIDE 0.9% FLUSH
3.0000 mL | Freq: Two times a day (BID) | INTRAVENOUS | Status: DC
  Administered 2017-04-13 – 2017-04-15 (×4): 3 mL via INTRAVENOUS

## 2017-04-12 MED ORDER — SODIUM CHLORIDE 0.9 % WEIGHT BASED INFUSION: 1.0000 mL/kg/h | INTRAVENOUS | Status: DC

## 2017-04-12 MED ORDER — VERAPAMIL HCL 2.5 MG/ML IV SOLN
INTRAVENOUS | Status: AC
  Filled 2017-04-12: qty 2

## 2017-04-12 MED ORDER — VERAPAMIL HCL 2.5 MG/ML IV SOLN
INTRAVENOUS | Status: DC | PRN
  Administered 2017-04-12: 200 ug via INTRACORONARY

## 2017-04-12 MED ORDER — SODIUM CHLORIDE 0.9 % WEIGHT BASED INFUSION
3.0000 mL/kg/h | INTRAVENOUS | Status: DC
  Administered 2017-04-12: 3 mL/kg/h via INTRAVENOUS

## 2017-04-12 MED ORDER — IOPAMIDOL (ISOVUE-370) INJECTION 76%
INTRAVENOUS | Status: AC
  Filled 2017-04-12: qty 100

## 2017-04-12 MED ORDER — ASPIRIN 81 MG PO CHEW: 81.0000 mg | CHEWABLE_TABLET | Freq: Every day | ORAL | Status: DC

## 2017-04-12 MED ORDER — NITROGLYCERIN 0.4 MG SL SUBL: 0.4000 mg | SUBLINGUAL_TABLET | SUBLINGUAL | Status: DC | PRN

## 2017-04-12 MED ORDER — SODIUM CHLORIDE 0.9 % IV SOLN
1.7500 mg/kg/h | INTRAVENOUS | Status: AC
  Administered 2017-04-12 (×2): 1.75 mg/kg/h via INTRAVENOUS

## 2017-04-12 MED ORDER — MIDAZOLAM HCL 2 MG/2ML IJ SOLN
INTRAMUSCULAR | Status: AC
  Filled 2017-04-12: qty 2

## 2017-04-12 MED ORDER — POTASSIUM CHLORIDE CRYS ER 20 MEQ PO TBCR
20.0000 meq | EXTENDED_RELEASE_TABLET | Freq: Every day | ORAL | Status: DC
  Administered 2017-04-13 – 2017-04-15 (×3): 20 meq via ORAL
  Filled 2017-04-12 (×3): qty 1

## 2017-04-12 MED ORDER — SODIUM CHLORIDE 0.9% FLUSH: 3.0000 mL | INTRAVENOUS | Status: DC | PRN

## 2017-04-12 MED ORDER — IOPAMIDOL (ISOVUE-370) INJECTION 76%
INTRAVENOUS | Status: AC
  Filled 2017-04-12: qty 125

## 2017-04-12 MED ORDER — SODIUM CHLORIDE 0.9 % IV SOLN: 250.0000 mL | INTRAVENOUS | Status: DC | PRN

## 2017-04-12 MED ORDER — BIVALIRUDIN BOLUS VIA INFUSION - CUPID
INTRAVENOUS | Status: DC | PRN
  Administered 2017-04-12: 60.9 mg via INTRAVENOUS

## 2017-04-12 MED ORDER — LOSARTAN POTASSIUM-HCTZ 100-25 MG PO TABS: 1.0000 | ORAL_TABLET | Freq: Every day | ORAL | Status: DC

## 2017-04-12 MED ORDER — MORPHINE SULFATE (PF) 4 MG/ML IV SOLN
2.0000 mg | INTRAVENOUS | Status: DC | PRN
  Administered 2017-04-12 (×2): 2 mg via INTRAVENOUS
  Filled 2017-04-12 (×2): qty 1

## 2017-04-12 MED ORDER — ROPINIROLE HCL 1 MG PO TABS
1.0000 mg | ORAL_TABLET | Freq: Every day | ORAL | Status: DC
  Administered 2017-04-13 – 2017-04-14 (×2): 1 mg via ORAL
  Filled 2017-04-12 (×3): qty 1

## 2017-04-12 MED ORDER — HYDRALAZINE HCL 20 MG/ML IJ SOLN: 5.0000 mg | INTRAMUSCULAR | Status: AC | PRN

## 2017-04-12 MED ORDER — SODIUM CHLORIDE 0.9 % IV SOLN
INTRAVENOUS | Status: AC | PRN
  Administered 2017-04-12: 1.75 mg/kg/h via INTRAVENOUS

## 2017-04-12 MED ORDER — CLOPIDOGREL BISULFATE 75 MG PO TABS
75.0000 mg | ORAL_TABLET | Freq: Every day | ORAL | Status: DC
  Administered 2017-04-13 – 2017-04-15 (×3): 75 mg via ORAL
  Filled 2017-04-12 (×3): qty 1

## 2017-04-12 MED ORDER — METOPROLOL TARTRATE 50 MG PO TABS
50.0000 mg | ORAL_TABLET | Freq: Two times a day (BID) | ORAL | Status: DC
  Administered 2017-04-12 – 2017-04-14 (×4): 50 mg via ORAL
  Filled 2017-04-12 (×6): qty 1

## 2017-04-12 MED ORDER — LIDOCAINE HCL (PF) 1 % IJ SOLN
INTRAMUSCULAR | Status: DC | PRN
  Administered 2017-04-12: 20 mL

## 2017-04-12 MED ORDER — FENTANYL CITRATE (PF) 100 MCG/2ML IJ SOLN
INTRAMUSCULAR | Status: DC | PRN
  Administered 2017-04-12 (×2): 25 ug via INTRAVENOUS

## 2017-04-12 MED ORDER — HEPARIN (PORCINE) IN NACL 2-0.9 UNIT/ML-% IJ SOLN
INTRAMUSCULAR | Status: AC
  Filled 2017-04-12: qty 1000

## 2017-04-12 MED ORDER — NITROGLYCERIN IN D5W 200-5 MCG/ML-% IV SOLN: 0.0000 ug/min | INTRAVENOUS | Status: DC

## 2017-04-12 MED ORDER — CLOPIDOGREL BISULFATE 300 MG PO TABS
ORAL_TABLET | ORAL | Status: AC
  Filled 2017-04-12: qty 1

## 2017-04-12 MED ORDER — ASPIRIN 81 MG PO CHEW
CHEWABLE_TABLET | ORAL | Status: AC
  Administered 2017-04-12: 81 mg via ORAL
  Filled 2017-04-12: qty 1

## 2017-04-12 MED ORDER — AMLODIPINE BESYLATE 10 MG PO TABS
10.0000 mg | ORAL_TABLET | Freq: Every evening | ORAL | Status: DC
  Administered 2017-04-13 – 2017-04-14 (×2): 10 mg via ORAL
  Filled 2017-04-12 (×2): qty 1

## 2017-04-12 MED ORDER — CLOPIDOGREL BISULFATE 300 MG PO TABS
ORAL_TABLET | ORAL | Status: DC | PRN
  Administered 2017-04-12: 300 mg via ORAL

## 2017-04-12 MED ORDER — HYDROCHLOROTHIAZIDE 25 MG PO TABS
25.0000 mg | ORAL_TABLET | Freq: Every day | ORAL | Status: DC
  Administered 2017-04-13 – 2017-04-15 (×3): 25 mg via ORAL
  Filled 2017-04-12 (×3): qty 1

## 2017-04-12 MED ORDER — CLOPIDOGREL BISULFATE 75 MG PO TABS: 75.0000 mg | ORAL_TABLET | Freq: Every day | ORAL | Status: DC

## 2017-04-12 MED ORDER — LABETALOL HCL 5 MG/ML IV SOLN: 10.0000 mg | INTRAVENOUS | Status: AC | PRN

## 2017-04-12 MED ORDER — ASPIRIN 81 MG PO CHEW
81.0000 mg | CHEWABLE_TABLET | ORAL | Status: AC
  Administered 2017-04-12: 81 mg via ORAL

## 2017-04-12 MED ORDER — MIDAZOLAM HCL 2 MG/2ML IJ SOLN
INTRAMUSCULAR | Status: DC | PRN
  Administered 2017-04-12: 1 mg via INTRAVENOUS

## 2017-04-12 MED ORDER — ONDANSETRON HCL 4 MG/2ML IJ SOLN: 4.0000 mg | Freq: Four times a day (QID) | INTRAMUSCULAR | Status: DC | PRN

## 2017-04-12 MED ORDER — TAMSULOSIN HCL 0.4 MG PO CAPS
0.4000 mg | ORAL_CAPSULE | Freq: Every day | ORAL | Status: DC
  Administered 2017-04-12 – 2017-04-15 (×4): 0.4 mg via ORAL
  Filled 2017-04-12 (×4): qty 1

## 2017-04-12 MED ORDER — GLIPIZIDE ER 5 MG PO TB24
5.0000 mg | ORAL_TABLET | Freq: Two times a day (BID) | ORAL | Status: DC
  Administered 2017-04-13 – 2017-04-15 (×5): 5 mg via ORAL
  Filled 2017-04-12 (×7): qty 1

## 2017-04-12 SURGICAL SUPPLY — 16 items
BALLN SAPPHIRE 2.0X12 (BALLOONS) ×2
BALLOON SAPPHIRE 2.0X12 (BALLOONS) ×1 IMPLANT
CATH INFINITI 5 FR RCB (CATHETERS) ×2 IMPLANT
CATH INFINITI MULTIPACK ST 5F (CATHETERS) ×2 IMPLANT
CATH VISTA GUIDE 6FR LCB (CATHETERS) ×2 IMPLANT
DEVICE SPIDERFX EMB PROT 5MM (WIRE) ×2 IMPLANT
KIT ENCORE 26 ADVANTAGE (KITS) ×2 IMPLANT
KIT PV (KITS) ×2 IMPLANT
SHEATH PINNACLE 5F 10CM (SHEATH) ×2 IMPLANT
SHEATH PINNACLE 6F 10CM (SHEATH) ×2 IMPLANT
STENT SYNERGY DES 3X24 (Permanent Stent) ×2 IMPLANT
SYR MEDRAD MARK V 150ML (SYRINGE) ×2 IMPLANT
TRANSDUCER W/STOPCOCK (MISCELLANEOUS) ×2 IMPLANT
TRAY PV CATH (CUSTOM PROCEDURE TRAY) ×2 IMPLANT
TUBING CIL FLEX 10 FLL-RA (TUBING) ×2 IMPLANT
WIRE ASAHI PROWATER 180CM (WIRE) ×2 IMPLANT

## 2017-04-12 NOTE — Progress Notes (Signed)
R femoral arterial sheath assessed. Sutures removed and 34ml of blood aspirated from sheath. Sheath removed and pressure applied for 65mins. Site assessed, level 0 noted. Gauze and transparent dressing applied. VSS. pt educated on post sheath removal. Will continue to monitor.

## 2017-04-12 NOTE — Progress Notes (Signed)
Angiomax off at 1545.

## 2017-04-12 NOTE — Interval H&P Note (Signed)
Cath Lab Visit (complete for each Cath Lab visit)  Clinical Evaluation Leading to the Procedure:   ACS: No.  Non-ACS:    Anginal Classification: CCS II  Anti-ischemic medical therapy: Maximal Therapy (2 or more classes of medications)  Non-Invasive Test Results: No non-invasive testing performed  Prior CABG: Previous CABG      History and Physical Interval Note:  04/12/2017 10:28 AM  Robert Burns  has presented today for surgery, with the diagnosis of cp  The various methods of treatment have been discussed with the patient and family. After consideration of risks, benefits and other options for treatment, the patient has consented to  Procedure(s): LEFT HEART CATH AND CORS/GRAFTS ANGIOGRAPHY (N/A) as a surgical intervention .  The patient's history has been reviewed, patient examined, no change in status, stable for surgery.  I have reviewed the patient's chart and labs.  Questions were answered to the patient's satisfaction.     Quay Burow

## 2017-04-12 NOTE — Progress Notes (Addendum)
Patient called c/o chest pressure 4/10. Pressure is constant. 12 EKG done.Patient remains on Nitro drip @ 20 mcg/min. Morphine 2mg  IV given. Patient reported pressure to be 2/10 after morphine. Cardiology paged.

## 2017-04-13 ENCOUNTER — Encounter (HOSPITAL_COMMUNITY): Payer: Self-pay | Admitting: Cardiovascular Disease

## 2017-04-13 DIAGNOSIS — R9439 Abnormal result of other cardiovascular function study: Secondary | ICD-10-CM | POA: Diagnosis not present

## 2017-04-13 DIAGNOSIS — E1169 Type 2 diabetes mellitus with other specified complication: Secondary | ICD-10-CM

## 2017-04-13 DIAGNOSIS — G4733 Obstructive sleep apnea (adult) (pediatric): Secondary | ICD-10-CM | POA: Diagnosis present

## 2017-04-13 DIAGNOSIS — I214 Non-ST elevation (NSTEMI) myocardial infarction: Secondary | ICD-10-CM | POA: Insufficient documentation

## 2017-04-13 DIAGNOSIS — Z950 Presence of cardiac pacemaker: Secondary | ICD-10-CM | POA: Diagnosis not present

## 2017-04-13 DIAGNOSIS — I97790 Other intraoperative cardiac functional disturbances during cardiac surgery: Secondary | ICD-10-CM | POA: Diagnosis present

## 2017-04-13 DIAGNOSIS — I25110 Atherosclerotic heart disease of native coronary artery with unstable angina pectoris: Secondary | ICD-10-CM

## 2017-04-13 DIAGNOSIS — I200 Unstable angina: Secondary | ICD-10-CM | POA: Diagnosis not present

## 2017-04-13 DIAGNOSIS — T463X5A Adverse effect of coronary vasodilators, initial encounter: Secondary | ICD-10-CM | POA: Diagnosis not present

## 2017-04-13 DIAGNOSIS — Z7982 Long term (current) use of aspirin: Secondary | ICD-10-CM | POA: Diagnosis not present

## 2017-04-13 DIAGNOSIS — E118 Type 2 diabetes mellitus with unspecified complications: Secondary | ICD-10-CM | POA: Diagnosis not present

## 2017-04-13 DIAGNOSIS — T82858A Stenosis of vascular prosthetic devices, implants and grafts, initial encounter: Secondary | ICD-10-CM | POA: Diagnosis present

## 2017-04-13 DIAGNOSIS — Y712 Prosthetic and other implants, materials and accessory cardiovascular devices associated with adverse incidents: Secondary | ICD-10-CM | POA: Diagnosis present

## 2017-04-13 DIAGNOSIS — Z7902 Long term (current) use of antithrombotics/antiplatelets: Secondary | ICD-10-CM | POA: Diagnosis not present

## 2017-04-13 DIAGNOSIS — Z91010 Allergy to peanuts: Secondary | ICD-10-CM | POA: Diagnosis not present

## 2017-04-13 DIAGNOSIS — Z87891 Personal history of nicotine dependence: Secondary | ICD-10-CM | POA: Diagnosis not present

## 2017-04-13 DIAGNOSIS — Z79899 Other long term (current) drug therapy: Secondary | ICD-10-CM | POA: Diagnosis not present

## 2017-04-13 DIAGNOSIS — I10 Essential (primary) hypertension: Secondary | ICD-10-CM | POA: Diagnosis present

## 2017-04-13 DIAGNOSIS — Z794 Long term (current) use of insulin: Secondary | ICD-10-CM

## 2017-04-13 DIAGNOSIS — I2510 Atherosclerotic heart disease of native coronary artery without angina pectoris: Secondary | ICD-10-CM

## 2017-04-13 DIAGNOSIS — Z9861 Coronary angioplasty status: Secondary | ICD-10-CM

## 2017-04-13 DIAGNOSIS — E785 Hyperlipidemia, unspecified: Secondary | ICD-10-CM

## 2017-04-13 DIAGNOSIS — E1151 Type 2 diabetes mellitus with diabetic peripheral angiopathy without gangrene: Secondary | ICD-10-CM | POA: Diagnosis present

## 2017-04-13 DIAGNOSIS — Z955 Presence of coronary angioplasty implant and graft: Secondary | ICD-10-CM | POA: Diagnosis not present

## 2017-04-13 DIAGNOSIS — I21A9 Other myocardial infarction type: Secondary | ICD-10-CM | POA: Diagnosis present

## 2017-04-13 DIAGNOSIS — E1140 Type 2 diabetes mellitus with diabetic neuropathy, unspecified: Secondary | ICD-10-CM | POA: Diagnosis present

## 2017-04-13 DIAGNOSIS — Z888 Allergy status to other drugs, medicaments and biological substances status: Secondary | ICD-10-CM | POA: Diagnosis not present

## 2017-04-13 DIAGNOSIS — E876 Hypokalemia: Secondary | ICD-10-CM | POA: Diagnosis present

## 2017-04-13 DIAGNOSIS — G4440 Drug-induced headache, not elsewhere classified, not intractable: Secondary | ICD-10-CM | POA: Diagnosis not present

## 2017-04-13 LAB — CBC
HCT: 39.6 % (ref 39.0–52.0)
HEMOGLOBIN: 13.3 g/dL (ref 13.0–17.0)
MCH: 28.4 pg (ref 26.0–34.0)
MCHC: 33.6 g/dL (ref 30.0–36.0)
MCV: 84.6 fL (ref 78.0–100.0)
Platelets: 146 10*3/uL — ABNORMAL LOW (ref 150–400)
RBC: 4.68 MIL/uL (ref 4.22–5.81)
RDW: 14.4 % (ref 11.5–15.5)
WBC: 11.6 10*3/uL — ABNORMAL HIGH (ref 4.0–10.5)

## 2017-04-13 LAB — BASIC METABOLIC PANEL
ANION GAP: 6 (ref 5–15)
BUN: 14 mg/dL (ref 6–20)
CALCIUM: 8.7 mg/dL — AB (ref 8.9–10.3)
CO2: 25 mmol/L (ref 22–32)
CREATININE: 0.89 mg/dL (ref 0.61–1.24)
Chloride: 108 mmol/L (ref 101–111)
GFR calc Af Amer: >60 mL/min (ref >60)
GFR calc non Af Amer: >60 mL/min (ref >60)
Glucose, Bld: 140 mg/dL — ABNORMAL HIGH (ref 65–99)
Potassium: 3.4 mmol/L — ABNORMAL LOW (ref 3.5–5.1)
SODIUM: 139 mmol/L (ref 135–145)

## 2017-04-13 LAB — TROPONIN I
TROPONIN I: 8.03 ng/mL — AB (ref <0.03)
Troponin I: 9.43 ng/mL (ref <0.03)

## 2017-04-13 MED ORDER — RANOLAZINE ER 500 MG PO TB12
500.0000 mg | ORAL_TABLET | Freq: Two times a day (BID) | ORAL | Status: DC
  Administered 2017-04-13 – 2017-04-14 (×3): 500 mg via ORAL
  Filled 2017-04-13 (×3): qty 1

## 2017-04-13 MED ORDER — ISOSORBIDE MONONITRATE ER 60 MG PO TB24
60.0000 mg | ORAL_TABLET | Freq: Every day | ORAL | Status: DC
  Administered 2017-04-13 – 2017-04-15 (×3): 60 mg via ORAL
  Filled 2017-04-13 (×3): qty 1

## 2017-04-13 MED ORDER — PRAVASTATIN SODIUM 40 MG PO TABS
40.0000 mg | ORAL_TABLET | Freq: Every day | ORAL | Status: DC
  Administered 2017-04-13 – 2017-04-14 (×2): 40 mg via ORAL
  Filled 2017-04-13 (×2): qty 1

## 2017-04-13 NOTE — Progress Notes (Signed)
CRITICAL VALUE ALERT  Critical Value:  Trop 8.04  Date & Time Notied:  1/4 0205  Provider Notified: Dr Kenton Kingfisher  Orders Received/Actions taken: will continue serial trop blood draws.

## 2017-04-13 NOTE — Progress Notes (Signed)
Rt femoral site dressing with red drainage. Site undressed and noted to have small slow continuous ooze. Pressure held to site x 20 min per protocol. DP pulse maintained throughout procedure. Ooze stopped no hematoma or bruising. site redressed, will continue to monitor.

## 2017-04-13 NOTE — Progress Notes (Signed)
Re-assessment of rt femoral site shows dressing with bloody drainage. Dressing changed per protocol. Site with non-continuous ooze of blood. No hematoma or bruising. Will continue to monitor dressing and site.

## 2017-04-13 NOTE — Progress Notes (Signed)
Progress Note  Patient Name: Robert Burns Date of Encounter: 04/13/2017  Primary Cardiologist: No primary care provider on file.   Subjective   Doing OK - notes some persistent "pressure" across his chest since yesterday - no more "chest pain". Also feels somewhat dizzy & has neck/shoulder stiffness  Inpatient Medications    Scheduled Meds: . amLODipine  10 mg Oral QPM  . aspirin EC  81 mg Oral QPM  . clopidogrel  75 mg Oral Q breakfast  . glipiZIDE  5 mg Oral BID WC  . losartan  100 mg Oral Daily   And  . hydrochlorothiazide  25 mg Oral Daily  . metoprolol tartrate  50 mg Oral BID  . potassium chloride SA  20 mEq Oral Daily  . rOPINIRole  1 mg Oral QHS  . sodium chloride flush  3 mL Intravenous Q12H  . tamsulosin  0.4 mg Oral Daily   Continuous Infusions: . sodium chloride    . nitroGLYCERIN 20 mcg/min (04/13/17 1200)   PRN Meds: sodium chloride, acetaminophen, morphine injection, nitroGLYCERIN, ondansetron (ZOFRAN) IV, sodium chloride flush   Vital Signs    Vitals:   04/13/17 0800 04/13/17 0900 04/13/17 1000 04/13/17 1100  BP: (!) 145/69 (!) 146/61 (!) 144/71 (!) 146/70  Pulse: 63 60 62 61  Resp: 18 10 (!) 22 (!) 21  Temp: 97.8 F (36.6 C) 97.8 F (36.6 C)    TempSrc: Oral Oral    SpO2: 96% 95% 97% 97%  Weight:      Height:        Intake/Output Summary (Last 24 hours) at 04/13/2017 1211 Last data filed at 04/13/2017 1200 Gross per 24 hour  Intake 1119.72 ml  Output 990 ml  Net 129.72 ml   Filed Weights   04/12/17 0601 04/13/17 0600  Weight: 179 lb (81.2 kg) 192 lb 0.3 oz (87.1 kg)    Telemetry    SR & occasionally A-paced rhythm; Sept MI, AU - Personally Reviewed  ECG    A paced, V Sensed rhythm, 62 bpm,  - Personally Reviewed  Physical Exam   GEN: Healthy appearing.  Resting in recliner. No acute distress.   Neck: No JVD or carotid bruit Cardiac: nl S1&S2. RRR, no murmurs, rubs, or gallops.  Respiratory: Clear to auscultation  bilaterally. Non-labored.   GI: Soft, nontender, non-distended  MS: No edema; No deformity. Cath side c/d/i. Neuro:  Nonfocal  Psych: Normal affect   Labs    Chemistry Recent Labs  Lab 04/13/17 0756  NA 139  K 3.4*  CL 108  CO2 25  GLUCOSE 140*  BUN 14  CREATININE 0.89  CALCIUM 8.7*  GFRNONAA >60  GFRAA >60  ANIONGAP 6     Hematology Recent Labs  Lab 04/13/17 0756  WBC 11.6*  RBC 4.68  HGB 13.3  HCT 39.6  MCV 84.6  MCH 28.4  MCHC 33.6  RDW 14.4  PLT 146*    Cardiac Enzymes Recent Labs  Lab 04/12/17 1647 04/13/17 0012  TROPONINI 0.34* 8.03*   No results for input(s): TROPIPOC in the last 168 hours.   BNPNo results for input(s): BNP, PROBNP in the last 168 hours.   DDimer No results for input(s): DDIMER in the last 168 hours.   Radiology    No results found.  Cardiac Studies   CATH-PCI SVG-OM 04/12/17: Stent Synergy Des 3x24 to SVG-OM). Sequential limb stayed occluded. Complicated by distal no-reflow with restoration of flow (? Type IVa MI)  Patient Profile     77 y.o. male with h/o CAD -CABG in 2004 (LIMA-LAD, SVG-rPDA, seqSVG-OM1/Ri-OM, SVG-Diag), & PAD (L SFA PTA-Stent), & s/p PPM who came in for OP cath to assess recurrent progressive anginal CP/SOB with abnormal Myoview (inferolateral ischemia).  Intra-cath -> PCI-SVG-OM complicated by no-reflow (unable to use distal protection) -> admitted to CCU with concern for TypeIVa procedural MI. Has ruled in with Tropoin ~8.  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  Will gently wean down the gtt, restart Imdur & add Ranexa. - on ASA/Plavix,   BP relatively stable on IV NTG gtt - may need to titrate up antiHTN meds once off NTG. - on high dose Losartan/HCTZ & Amlodpine & moderate dose metoprolol (could increase - has PPM in place).   NOt on Statin here - restart lovastatin on d/c (cover with formulary equivalent.  ON glipizide - CBG stable  With + Troponin/Type IVa MI - will need to monitor at least one more day - but would like to ensure that he is not having any further chest pressure -- concern that he is infarcting the target vessel (vs. Simply distal myocardial injury).  For questions or updates, please contact [place] Please consult [email or web address] for contact info under Cardiology/STEMI.      Signed, Glenetta Hew, MD  04/13/2017, 12:11 PM

## 2017-04-13 NOTE — Progress Notes (Signed)
CARDIAC REHAB PHASE I   PRE:  Rate/Rhythm: 65 SR    BP: sitting 155/76    SaO2: 98 RA  MODE:  Ambulation: 370 ft   POST:  Rate/Rhythm: 105 ST    BP: sitting 189/86     SaO2: 98 RA  Pt up in recliner, just finished lunch. Sts chest pressure is 1-2/10. He also endorses tightness across his shoulders and back of head. Tolerated walk around unit well, chest pressure slightly increased to 3/10 at end of walk, as well as his shoulders/neck/head pain.  BP up. I was able to massage his shoulders and head, which brought him relief there. Still endorsing chest pressure but it had decreased to 1/10 by the end of his education. Seems to be feeling better. Ed completed including MI, stent, restrictions, diet, ex, NTG, and CRPII. Will send referral to [facility].  [place], ACSM 04/13/2017 2:18 PM

## 2017-04-13 NOTE — Progress Notes (Signed)
0800: patient assessed; R fem site level 0 0900: bed rest d/c'd 1000: patient OOB to chair 1200: patient reassessed; no changes from previous assessment unless otherwise noted 1215: Dr. Ellyn Hack rounding @bedside ; aware of patient's ongoing chest discomfort 1330: cardiac rehab working with patient  1600: patient reassessed; no changes unless otherwise charted

## 2017-04-14 LAB — BASIC METABOLIC PANEL
ANION GAP: 10 (ref 5–15)
BUN: 12 mg/dL (ref 6–20)
CALCIUM: 9.1 mg/dL (ref 8.9–10.3)
CHLORIDE: 107 mmol/L (ref 101–111)
CO2: 28 mmol/L (ref 22–32)
Creatinine, Ser: 0.99 mg/dL (ref 0.61–1.24)
GFR calc non Af Amer: >60 mL/min (ref >60)
Glucose, Bld: 112 mg/dL — ABNORMAL HIGH (ref 65–99)
POTASSIUM: 3.8 mmol/L (ref 3.5–5.1)
Sodium: 145 mmol/L (ref 135–145)

## 2017-04-14 LAB — TROPONIN I: Troponin I: 4.76 ng/mL (ref <0.03)

## 2017-04-14 MED ORDER — RANOLAZINE ER 500 MG PO TB12
1000.0000 mg | ORAL_TABLET | Freq: Two times a day (BID) | ORAL | Status: DC
  Administered 2017-04-14 – 2017-04-15 (×2): 1000 mg via ORAL
  Filled 2017-04-14 (×2): qty 2

## 2017-04-14 MED ORDER — METOPROLOL TARTRATE 50 MG PO TABS
75.0000 mg | ORAL_TABLET | Freq: Two times a day (BID) | ORAL | Status: DC
  Administered 2017-04-14 – 2017-04-15 (×2): 75 mg via ORAL
  Filled 2017-04-14 (×2): qty 1

## 2017-04-14 NOTE — Progress Notes (Signed)
Progress Note  Patient Name: Robert Burns Date of Encounter: 04/14/2017  Primary Cardiologist: Quay Burow, MD  Subjective   Still w/ mild, persistent, 1-2/10 chest pressure @ rest that increases to 3-4/10 with ambulation.  Also radiates to bilateral shoulders, upper back/lower neck with ambulation.  Switched from IV ntg to imdur yesterday.  Noted some headache with higher dose of imdur (was on 30 @ home, currently on 60).  Eager to go home.  Inpatient Medications    Scheduled Meds: . amLODipine  10 mg Oral QPM  . aspirin EC  81 mg Oral QPM  . clopidogrel  75 mg Oral Q breakfast  . glipiZIDE  5 mg Oral BID WC  . losartan  100 mg Oral Daily   And  . hydrochlorothiazide  25 mg Oral Daily  . isosorbide mononitrate  60 mg Oral Daily  . metoprolol tartrate  50 mg Oral BID  . potassium chloride SA  20 mEq Oral Daily  . pravastatin  40 mg Oral q1800  . ranolazine  500 mg Oral BID  . rOPINIRole  1 mg Oral QHS  . sodium chloride flush  3 mL Intravenous Q12H  . tamsulosin  0.4 mg Oral Daily   Continuous Infusions: . sodium chloride     PRN Meds: sodium chloride, acetaminophen, morphine injection, nitroGLYCERIN, ondansetron (ZOFRAN) IV, sodium chloride flush   Vital Signs    Vitals:   04/14/17 0300 04/14/17 0400 04/14/17 0500 04/14/17 0600  BP: (!) 144/68 (!) 142/71 (!) 148/69 (!) 149/71  Pulse:  60  61  Resp:   17 17  Temp:  98.6 F (37 C)    TempSrc:  Oral    SpO2: 97% 96% 98% 97%  Weight:      Height:        Intake/Output Summary (Last 24 hours) at 04/14/2017 0802 Last data filed at 04/14/2017 0400 Gross per 24 hour  Intake 30.36 ml  Output 550 ml  Net -519.64 ml   Filed Weights   04/12/17 0601 04/13/17 0600 04/13/17 2047  Weight: 179 lb (81.2 kg) 192 lb 0.3 oz (87.1 kg) 192 lb 0.3 oz (87.1 kg)    Physical Exam   GEN: Well nourished, well developed, in no acute distress.  HEENT: Grossly normal.  Neck: Supple, no JVD, carotid bruits, or masses. Cardiac:  RRR, no murmurs, rubs, or gallops. No clubbing, cyanosis, edema.  Radials/DP/PT 2+ and equal bilaterally. R groin w/o bleeding/bruit/hematoma. Respiratory:  Respirations regular and unlabored, diminished breath sounds bilaterally. GI: Soft, nontender, nondistended, BS + x 4. MS: no deformity or atrophy. Skin: warm and dry, no rash. Neuro:  Strength and sensation are intact. Psych: AAOx3.  Normal affect.  Labs    Chemistry Recent Labs  Lab 04/13/17 0756  NA 139  K 3.4*  CL 108  CO2 25  GLUCOSE 140*  BUN 14  CREATININE 0.89  CALCIUM 8.7*  GFRNONAA >60  GFRAA >60  ANIONGAP 6     Hematology Recent Labs  Lab 04/13/17 0756  WBC 11.6*  RBC 4.68  HGB 13.3  HCT 39.6  MCV 84.6  MCH 28.4  MCHC 33.6  RDW 14.4  PLT 146*    Cardiac Enzymes Recent Labs  Lab 04/12/17 1647 04/13/17 0012 04/13/17 1231  TROPONINI 0.34* 8.03* 9.43*     Radiology    No results found.  Telemetry    RSR, PVC's, A pacing on demand. - Personally Reviewed  Cardiac Studies   1.4.2018 CATH-PCI SVG-OM 04/12/17:  Stent Synergy Des 3x24 to SVG-OM). Sequential limb stayed occluded. Complicated by distal no-reflow with restoration of flow (? Type IVa MI)   Patient Profile     77 y.o. male with h/o CAD -CABG in 2004 (LIMA-LAD, SVG-rPDA, seqSVG-OM1/Ri-OM, SVG-Diag), & PAD (L SFA PTA-Stent), & s/p PPM who came in for OP cath to assess recurrent progressive anginal CP/SOB with abnormal Myoview (inferolateral ischemia).  Intra-cath -> PCI-SVG-OM complicated by no-reflow (unable to use distal protection) -> admitted to CCU with concern for TypeIVa procedural MI. Has ruled in with Tropoin ~8.  Assessment & Plan    1.  NSTEMI/CAD:  Long h/o CAD s/p prior CABG in 2004.  Recent [place] w/ abnl MV in Dec w/ subtle inflat ischemia.  Cath 1/3 revealed severe prox stenosis of VG  OM1  OM2 with occlusive dzs in the sequential portion of the graft.  The proximal graft was successfully tx w/ a DES.  There was  transient no-reflow, that did respond to vasodilators.  The sequential portion of the graft to the OM2 was not intervened upon.  Post PCI, he has cont to complain of mild chest pressure, which overall has improved but persists @ 1-2/10 @ rest and increases to 3-4/10 with radiation to bilat shoulders/neck/upper back w ambulation.  Trop rose to 9.43 yesterday afternoon - will f/u this AM.  Transitioned from IV NTG to imdur and ranexa yesterday.  Had sl headache w/ 60 of imdur (was on 30 @ home).  Given ongoing Ss, will titrate ranexa to 1000 bid.  BPs also moderately elevated.  Will titrate  blocker further (has pacer).  Cont asa, plavix, statin (ldl 44 in 06/2015).  If trop trending down and feels well w/ ambulation this AM, possible d/c this afternoon.  2.  Essential HTN: BP trending in 140's.  With angina, will titrate  blocker.  Cont ARB/HCTZ, CCB.  3.  HL:  LDL 44 in 06/2015.  Cont statin rx.  4.  PAD:  Denies claudication.  Cont asa/statin.  5.  DM II: cont glipizide. Gluc 140 yest morning.  6.  Hypokalemia:  F/u bmet this AM - K 3.4 yesterday.  Signed, Murray Hodgkins, NP  04/14/2017, 8:02 AM    For questions or updates, please contact   Please consult [email or web address] for contact info under Cardiology/STEMI.   Patient seen and examined independently. Emeline Gins, NP note reviewed carefully - agree with his assessment and plan. I have edited the note based on my findings.   Troponin trending down but still with some angina which is worse with walking. Agree with increasing b-blocker and Ranexa. Continue imdur. Will transfer to tele. Possible if stable.   Will place CM consult for Ranexa.   Glori Bickers, MD  12:22 PM

## 2017-04-14 NOTE — Progress Notes (Signed)
CARDIAC REHAB PHASE I   PRE:  Rate/Rhythm: 60 paced  BP:  Supine: 142/71 Sitting:   Standing:    SaO2: 97% RA  MODE:  Ambulation: 740 ft   POST:  Rate/Rhythm: 98 paced w/ PVCs  BP:  Supine: 183/85 Sitting:   Standing:    SaO2: 97% RA  [id number] Patient ambulated 740 ft with assist x1, gait fast, steady. Pt c/o mild chest pressure midway through ambulation, states he has some pressure at rest prior to walk and now rates the pressure 2.5-3. Pt had some radiation of pressure into neck at the end of ambulation. BP after ambulation was elevated at 183/85.  To bed after walk for breakfast. Pt states chest pressure better than it was during ambulation but more than it was prior to ambulation. Informed patient's nurse of symptoms and elevated BP.  Sol Passer, MS, ACSM CEP

## 2017-04-15 ENCOUNTER — Encounter (HOSPITAL_COMMUNITY): Payer: Self-pay | Admitting: Nurse Practitioner

## 2017-04-15 MED ORDER — METOPROLOL TARTRATE 50 MG PO TABS: 100.0000 mg | ORAL_TABLET | Freq: Two times a day (BID) | ORAL | Status: DC

## 2017-04-15 MED ORDER — ISOSORBIDE MONONITRATE ER 30 MG PO TB24
30.0000 mg | ORAL_TABLET | Freq: Once | ORAL | Status: AC
  Administered 2017-04-15: 30 mg via ORAL
  Filled 2017-04-15: qty 1

## 2017-04-15 MED ORDER — NITROGLYCERIN 0.4 MG SL SUBL: 0.4000 mg | SUBLINGUAL_TABLET | SUBLINGUAL | 3 refills | Status: DC | PRN

## 2017-04-15 MED ORDER — METOPROLOL TARTRATE 100 MG PO TABS: 100.0000 mg | ORAL_TABLET | Freq: Two times a day (BID) | ORAL | 6 refills | Status: DC

## 2017-04-15 MED ORDER — RANOLAZINE ER 1000 MG PO TB12
1000.0000 mg | ORAL_TABLET | Freq: Two times a day (BID) | ORAL | 6 refills | Status: DC
Start: 2017-04-15 — End: 2017-08-07

## 2017-04-15 MED ORDER — ISOSORBIDE MONONITRATE ER 60 MG PO TB24
90.0000 mg | ORAL_TABLET | Freq: Every day | ORAL | 6 refills | Status: DC
Start: 2017-04-16 — End: 2017-05-11

## 2017-04-15 MED ORDER — ISOSORBIDE MONONITRATE ER 60 MG PO TB24: 90.0000 mg | ORAL_TABLET | Freq: Every day | ORAL | Status: DC

## 2017-04-15 NOTE — Discharge Summary (Signed)
Discharge Summary    Patient ID: Robert Burns,  MRN: [id number], DOB/AGE: Nov 06, 1940 77 y.o.  Admit date: 04/12/2017 Discharge date: 04/15/2017  Primary Care Provider: [name] Primary Cardiologist: Quay Burow, MD  Discharge Diagnoses    Principal Problem:   Non-ST elevation MI (NSTEMI) ([facility]) - Periprocedural Type IVa  **s/p PCI and DES of the Proximal VG  OM1  OM2 (continuation of graft beyond OM1 is occluded).  Active Problems:   CAD -CABG 2004 LIMA-LAD, VG-Dx, seq VG -RI & OM branch; VG-PDA    Unstable angina (H[facility])   Type 2 diabetes mellitus with complication, with long-term current use of insulin (HCC)   Essential hypertension   Abnormal nuclear stress test   SSS (sick sinus syndrome), medtronic adapta   Hyperlipidemia due to type 2 diabetes mellitus (HCC)   OSA on CPAP   Peripheral arterial disease (HCC)   Allergies Allergies  Allergen Reactions  . Peanut-Containing Drug Products Anaphylaxis and Other (See Comments)    Tongue swelling is severe  . Ace Inhibitors Other (See Comments)    Cough  . Clonidine Hcl Other (See Comments)    Patch only - skin irritation  . Coreg [Carvedilol] Itching  . Diltiazem Hcl Other (See Comments)    Unknown  . Mobic [Meloxicam] Other (See Comments)    GI upset    Diagnostic Studies/Procedures    Cardiac Catheterization and Percutaneous Coronary Intervention 1.3.2019  Diagnostic  Dominance: Right  Left Anterior Descending  Ost LAD to Prox LAD lesion 100% stenosed  Ost LAD to Prox LAD lesion is 100% stenosed.  Mid LAD lesion 50% stenosed  Mid LAD lesion is 50% stenosed.  Mid LAD to Dist LAD lesion 75% stenosed  Mid LAD to Dist LAD lesion is 75% stenosed.  Left Circumflex  Ost Cx lesion 60% stenosed  Ost Cx lesion is 60% stenosed.  Second Obtuse Marginal Branch  Ost 2nd Mrg to 2nd Mrg lesion 80% stenosed  Ost 2nd Mrg to 2nd Mrg lesion is 80% stenosed.  Right Coronary Artery  Ost RCA to Prox RCA lesion 100%  stenosed  Ost RCA to Prox RCA lesion is 100% stenosed.  Right Posterior Descending Artery  RPDA lesion 80% stenosed  RPDA lesion is 80% stenosed.  LIMA Graft to Mid LAD  Graft to RPDA  Origin to Prox Graft lesion 0% stenosed  Previously placed Origin to Prox Graft stent (unknown type) is widely patent.  Graft to Ost 1st Diag  Origin to Prox Graft lesion 0% stenosed  Previously placed Origin to Prox Graft stent (unknown type) is widely patent.  Prox Graft lesion 50% stenosed  Prox Graft lesion is 50% stenosed.  Dist Graft lesion 0% stenosed  Previously placed Dist Graft stent (unknown type) is widely patent.  Sequential Graft to Ost 1st Mrg, 2nd Mrg  Origin to Prox Graft lesion before Ost 1st Mrg 90% stenosed  **The proximal VG was successfully stented using a 3.0 x 75mm Synergy DES.  Prox Graft to Mid Graft lesion between Ost 1st Mrg and 2nd Mrg 100% stenosed  Prox Graft to Mid Graft lesion between [facility] and 2nd Mrg is 100% stenosed.  Intervention   Origin to Prox Graft lesion (Sequential Graft to Ost 1st Mrg, 2nd Mrg)  Stent  A stent was successfully placed.  Post-Intervention Lesion Assessment  There is no residual stenosis post intervention.   Ventricle The left ventricular size is normal. The left ventricular systolic function is normal. LV end  diastolic pressure is normal. The left ventricular ejection fraction is 50-55% by visual estimate. No regional wall motion abnormalities.  _____________   History of Present Illness     77 year old male with a history of coronary artery disease status post 5 vessel bypass in March 2004, hypertension, hyperlipidemia, diabetes, sick sinus syndrome status post pacemaker placement, sleep apnea, and peripheral arterial disease status post prior lower extremity percutaneous interventions.  He was recently evaluated in cardiology clinic with complaints of new onset substernal chest discomfort occurring several times per week.  Stress  testing was undertaken and showed subtle inferolateral ischemia.  Echocardiogram showed normal LV function.  In the setting of new symptoms and abnormal stress test, decision was made to pursue diagnostic catheterization.  Hospital Course     Consultants: None  Patient presented to the [facility] cardiac catheterization laboratory on April 12, 2017, and underwent diagnostic catheterization revealing patency of the LIMA  LAD, VG  RPDA (patent stent), and VG  Diag (patent stent). The ostial/proximal VG  OM1  OM2 was noted to have a 90% stenosis while the continuation of the graft between the OM 1 and OM 2 was occluded.  Patient was known to have severe native multivessel disease including a proximal occlusion of the LAD and right coronary artery.  He subsequently underwent successful PCI and drug-eluting stent placement within the ostial/proximal vein graft  OM1.  Following percutaneous intervention, there was a period of no reflow which resolved with intracoronary vasodilators.  In the setting of a period of no reflow, he was admitted to cardiac stepdown and did rule in for non-STEMI, eventually peaked his troponin at 9.43 on January 4.  He did complain of mild, 1-2/10 persistent chest pressure across his chest that worsened to 2-4/10 discomfort with radiation to his shoulders and neck with ambulation.  In that setting, we titrated   blocker and nitrate therapy, and added Ranexa.  He has had stable symptoms over the past 48 hours with down trending of his troponin.  He is eager to be discharged and despite mild symptoms with ambulation, he has not had significant limitations.  As result, he will be discharged home today with plan for early office follow-up next week. _____________  Discharge Vitals Blood pressure (!) 162/75, pulse 62, temperature 98.3 F (36.8 C), temperature source Oral, resp. rate 13, height 5\' 8"  (1.727 m), weight 184 lb 11.9 oz (83.8 kg), SpO2 96 %.  Filed Weights   04/13/17 0600  04/13/17 2047 04/15/17 0400  Weight: 192 lb 0.3 oz (87.1 kg) 192 lb 0.3 oz (87.1 kg) 184 lb 11.9 oz (83.8 kg)    Labs & Radiologic Studies    CBC Recent Labs    04/13/17 0756  WBC 11.6*  HGB 13.3  HCT 39.6  MCV 84.6  PLT 956*   Basic Metabolic Panel Recent Labs    04/13/17 0756 04/14/17 0908  NA 139 145  K 3.4* 3.8  CL 108 107  CO2 25 28  GLUCOSE 140* 112*  BUN 14 12  CREATININE 0.89 0.99  CALCIUM 8.7* 9.1    Cardiac Enzymes Recent Labs    04/13/17 0012 04/13/17 1231 04/14/17 0908  TROPONINI 8.03* 9.43* 4.76*  _____________  Dg Chest 2 View  Result Date: 03/30/2017 CLINICAL DATA:  Chest pain.  History of CABG. EXAM: CHEST  2 VIEW COMPARISON:  02/28/2016.  12/20/2015. FINDINGS: Cardiac pacer with lead tips over the right atrium right ventricle. Prior CABG. Heart size normal . No focal infiltrate.  Nodular opacity noted over the left lung base appears stable as consistent nipple shadow . Degenerative change thoracic spine. IMPRESSION: 1. Cardiac pacer stable position with lead tips in right atrium right ventricle. Prior CABG. Heart size normal. 2. No acute cardiopulmonary disease identified. Electronically Signed   By: Marcello Moores  Register   On: 03/30/2017 06:54   Disposition   Pt is being discharged home today in good condition.  Follow-up Plans & Appointments    Follow-up Information    Lendon Colonel, NP Follow up on 04/23/2017.   Specialties:  Nurse Practitioner, Radiology, Cardiology Why:  8:00 AM - Dr. Kennon Holter Nurse Practitioner Contact information: [address] [place] [address] [phone]          Discharge Instructions    Amb Referral to Cardiac Rehabilitation   Complete by:  As directed    Diagnosis:   Coronary Stents PTCA NSTEMI        Discharge Medications   Allergies as of 04/15/2017      Reactions   Peanut-containing Drug Products Anaphylaxis, Other (See Comments)   Tongue swelling is severe   Ace Inhibitors  Other (See Comments)   Cough   Clonidine Hcl Other (See Comments)   Patch only - skin irritation   Coreg [carvedilol] Itching   Diltiazem Hcl Other (See Comments)   Unknown   Mobic [meloxicam] Other (See Comments)   GI upset      Medication List    STOP taking these medications   Fish Oil 1000 MG Caps     TAKE these medications   amLODipine 10 MG tablet Commonly known as:  NORVASC Take 10 mg by mouth every evening.   aspirin EC 81 MG tablet Take 81 mg by mouth every evening.   clopidogrel 75 MG tablet Commonly known as:  PLAVIX Take 1 tablet (75 mg total) by mouth daily with breakfast.   doxylamine (Sleep) 25 MG tablet Commonly known as:  UNISOM Take 25 mg by mouth at bedtime.   glipiZIDE 5 MG 24 hr tablet Commonly known as:  GLUCOTROL XL Take 5 mg by mouth 2 (two) times daily.   isosorbide mononitrate 60 MG 24 hr tablet Commonly known as:  IMDUR Take 1.5 tablets (90 mg total) by mouth daily. Start taking on:  04/16/2017   losartan-hydrochlorothiazide 100-25 MG tablet Commonly known as:  HYZAAR Take 1 tablet by mouth daily.   lovastatin 40 MG tablet Commonly known as:  MEVACOR Take 40 mg by mouth at bedtime.   metFORMIN 1000 MG tablet Commonly known as:  GLUCOPHAGE Take 1,000 mg by mouth 2 (two) times daily with a meal.   metoprolol tartrate 100 MG tablet Commonly known as:  LOPRESSOR Take 1 tablet (100 mg total) by mouth 2 (two) times daily. What changed:    medication strength  how much to take   multivitamin capsule Take 1 capsule by mouth daily.   nitroGLYCERIN 0.4 MG SL tablet Commonly known as:  NITROSTAT Place 1 tablet (0.4 mg total) under the tongue every 5 (five) minutes as needed for chest pain.   potassium chloride SA 20 MEQ tablet Commonly known as:  K-DUR,KLOR-CON Take 1 tablet (20 mEq total) by mouth daily. What changed:  when to take this   ranolazine 1000 MG SR tablet Commonly known as:  RANEXA Take 1 tablet (1,000 mg total)  by mouth 2 (two) times daily.   rOPINIRole 1 MG tablet Commonly known as:  REQUIP Take 1 mg by mouth at  bedtime.   sodium chloride 0.65 % Soln nasal spray Commonly known as:  OCEAN Place 1 spray into both nostrils as needed for congestion.   SUPER B COMPLEX/C PO Take 1 tablet by mouth daily.   tamsulosin 0.4 MG Caps capsule Commonly known as:  FLOMAX Take 0.4 mg by mouth daily.   vitamin C 1000 MG tablet Take 1,000 mg by mouth 2 (two) times daily.   Vitamin D3 2000 units capsule Take 2,000 Units by mouth 2 (two) times daily.        Aspirin prescribed at discharge?  Yes High Intensity Statin Prescribed? (Lipitor 40-80mg  or Crestor 20-40mg ): No: Pt on lovastatin @ home w/ LDL of 44. Beta Blocker Prescribed? Yes For EF <40%, was ACEI/ARB Prescribed? Yes ADP Receptor Inhibitor Prescribed? (i.e. Plavix etc.-Includes Medically Managed Patients): Yes For EF <40%, Aldosterone Inhibitor Prescribed? No: N/A Was EF assessed during THIS hospitalization? Yes Was Cardiac Rehab II ordered? (Included Medically managed Patients): Yes   Outstanding Labs/Studies   None  Duration of Discharge Encounter   Greater than 30 minutes including physician time.  Signed, Murray Hodgkins NP 04/15/2017, 12:39 PM  Patient seen and examined independently. Emeline Gins, NP note reviewed carefully - agree with his assessment and plan. I have edited the note based on my findings.   CP improved. He is eager to go home. Will d/c today with close f/u. Discussed need to contact us or 911 immediately if CP pattern intensifies.   Glori Bickers, MD  9:32 PM

## 2017-04-15 NOTE — Progress Notes (Signed)
Progress Note  Patient Name: Robert Burns Date of Encounter: 04/15/2017  Primary Cardiologist: Quay Burow, MD  Subjective   Still with mild, persistent 1/10 chest tightness.  Worsened to 2-3/10 with ambulation yesterday.  This AM, he's already walked two laps around the unit.  He did have recurrent worsening of chest tightness w/ radiation to shoulders and back, but says he didn't have to stop walking and overall, felt ok.  He's very eager to go home.    Inpatient Medications    Scheduled Meds: . amLODipine  10 mg Oral QPM  . aspirin EC  81 mg Oral QPM  . clopidogrel  75 mg Oral Q breakfast  . glipiZIDE  5 mg Oral BID WC  . losartan  100 mg Oral Daily   And  . hydrochlorothiazide  25 mg Oral Daily  . isosorbide mononitrate  60 mg Oral Daily  . metoprolol tartrate  75 mg Oral BID  . potassium chloride SA  20 mEq Oral Daily  . pravastatin  40 mg Oral q1800  . ranolazine  1,000 mg Oral BID  . rOPINIRole  1 mg Oral QHS  . sodium chloride flush  3 mL Intravenous Q12H  . tamsulosin  0.4 mg Oral Daily   Continuous Infusions: . sodium chloride     PRN Meds: sodium chloride, acetaminophen, morphine injection, nitroGLYCERIN, ondansetron (ZOFRAN) IV, sodium chloride flush   Vital Signs    Vitals:   04/15/17 0400 04/15/17 0500 04/15/17 0800 04/15/17 0856  BP: (!) 154/64 (!) 161/73  (!) 163/69  Pulse: 62   67  Resp: 17 18 (!) 21 20  Temp: 98.4 F (36.9 C)  98.3 F (36.8 C)   TempSrc: Oral  Oral   SpO2: 96%     Weight: 184 lb 11.9 oz (83.8 kg)     Height:        Intake/Output Summary (Last 24 hours) at 04/15/2017 0907 Last data filed at 04/15/2017 0800 Gross per 24 hour  Intake 240 ml  Output 175 ml  Net 65 ml   Filed Weights   04/13/17 0600 04/13/17 2047 04/15/17 0400  Weight: 192 lb 0.3 oz (87.1 kg) 192 lb 0.3 oz (87.1 kg) 184 lb 11.9 oz (83.8 kg)    Physical Exam   GEN: Well nourished, well developed, in no acute distress.  HEENT: Grossly normal.  Neck:  Supple, no JVD, carotid bruits, or masses. Cardiac: RRR, no murmurs, rubs, or gallops. No clubbing, cyanosis, edema.  Radials/DP/PT 2+ and equal bilaterally.  R groin w/o bleeding, bruit, or hematoma. Respiratory:  Respirations regular and unlabored, clear to auscultation bilaterally. GI: Soft, nontender, nondistended, BS + x 4. MS: no deformity or atrophy. Skin: warm and dry, no rash. Neuro:  Strength and sensation are intact. Psych: AAOx3.  Normal affect.  Labs    Chemistry Recent Labs  Lab 04/13/17 0756 04/14/17 0908  NA 139 145  K 3.4* 3.8  CL 108 107  CO2 25 28  GLUCOSE 140* 112*  BUN 14 12  CREATININE 0.89 0.99  CALCIUM 8.7* 9.1  GFRNONAA >60 >60  GFRAA >60 >60  ANIONGAP 6 10     Hematology Recent Labs  Lab 04/13/17 0756  WBC 11.6*  RBC 4.68  HGB 13.3  HCT 39.6  MCV 84.6  MCH 28.4  MCHC 33.6  RDW 14.4  PLT 146*    Cardiac Enzymes Recent Labs  Lab 04/12/17 1647 04/13/17 0012 04/13/17 1231 04/14/17 0908  TROPONINI 0.34* 8.03* 9.43*  4.76*     Radiology    No results found.  Telemetry    A paced - Personally Reviewed  Cardiac Studies   1.4.2018 CATH-PCI SVG-OM 04/12/17:Stent Synergy Des 3x24to SVG-OM). Sequential limb stayed occluded. Complicated by distal no-reflow with restoration of flow (? Type IVa MI)  Patient Profile     77 y.o.malewith h/o CAD -CABG in 2004 (LIMA-LAD, SVG-rPDA, seqSVG-OM1/Ri-OM, SVG-Diag), & PAD (L SFA PTA-Stent), & s/p PPM who came in for OP cath to assess recurrent progressive anginal CP/SOB with abnormal Myoview (inferolateral ischemia).  Intra-cath -> PCI-SVG-OM complicated by no-reflow (unable to use distal protection) -> admitted to CCU with concern for TypeIVa procedural MI. Has ruled in with Tropoin ~8.  Assessment & Plan    1.  NSTEMI/CAD:  Long h/o CAD s/p prior CABG in 2004.  Recent [place] w/ abnl MV in Dec w/ subtle inflat ischemia.  Cath 1/3 revealed severe prox stenosis of VG  OM1  OM2 with occlusive  dzs in the sequential portion of the graft.  The proximal graft was successfully tx w/ a DES.  There was transient no-reflow, that did respond to vasodilators.  The sequential portion of the graft to the OM2 was not intervened upon.  He is continued to have mild, 1/10 persistent chest tightness that worsened to 2-3/10 with radiation to bilateral shoulder/neck/upper back with ambulation.  Troponin was repeated yesterday and is trending down despite ongoing symptoms.  This morning, he has walked 2 laps around the unit without having to rest.  He did have slight worsening of symptoms.  Yesterday, he reported headache, though today he says this only happens in association with worsening chest tightness.  It does not persist.  As result, I will titrate his isosorbide to 90 mg daily.  Yesterday, we titrated Ranexa to 1000 mg twice daily.  Blood pressures continue to trend in the 150s and 160s.  I will push beta-blocker further.  Continue aspirin, Plavix, statin.  Ambulate some more this morning.  I suspect he will be okay for discharge later this afternoon.  2.  Essential hypertension: As above, blood pressures continue to trend in the 150s and 160s.  Will titrate beta-blocker further.  Continue ARB/HCTZ, and calcium channel blocker.  3.  Hyperlipidemia: LDL 44 in March 2017.  Continue statin therapy.  Next  4.  Peripheral arterial disease: Denies claudication.  Continue aspirin and statin.  5.  Type 2 diabetes mellitus: Continue glipizide.  Nonfasting glucose was 112 yesterday morning.  6.  Hypokalemia: Stable on basic metabolic panel yesterday.   Signed, Murray Hodgkins, NP  04/15/2017, 9:07 AM    For questions or updates, please contact   Please consult [email or web address] for contact info under Cardiology/STEMI.  Patient seen and examined independently. Emeline Gins, NP note reviewed carefully - agree with his assessment and plan. I have edited the note based on my findings.   Still with mild  exertional CP but improved from yesterday. Troponin trending down. Very eager to go home. Will plan for d/c today. Will need close f/u with Dr. Gwenlyn Found. Discussed need to watch his symptoms very closely and let us know if getting any worse. Will need CR.   Glori Bickers, MD  12:28 PM

## 2017-04-15 NOTE — Discharge Instructions (Signed)
NO HEAVY LIFTING X 2 WEEKS. NO SEXUAL ACTIVITY X 2 WEEKS. NO DRIVING X 1 WEEK. NO SOAKING BATHS, HOT TUBS, POOLS, ETC., X 7 DAYS.  Groin Site Care Refer to this sheet in the next few weeks. These instructions provide you with information on caring for yourself after your procedure. Your caregiver may also give you more specific instructions. Your treatment has been planned according to current medical practices, but problems sometimes occur. Call your caregiver if you have any problems or questions after your procedure. HOME CARE INSTRUCTIONS  You may shower 24 hours after the procedure. Remove the bandage (dressing) and gently wash the site with plain soap and water. Gently pat the site dry.   Do not apply powder or lotion to the site.   Do not sit in a bathtub, swimming pool, or whirlpool for 5 to 7 days.   No bending, squatting, or lifting anything over 10 pounds (4.5 kg) as directed by your caregiver.   Inspect the site at least twice daily.   Do not drive home if you are discharged the same day of the procedure. Have someone else drive you.   What to expect:  Any bruising will usually fade within 1 to 2 weeks.   Blood that collects in the tissue (hematoma) may be painful to the touch. It should usually decrease in size and tenderness within 1 to 2 weeks.  SEEK IMMEDIATE MEDICAL CARE IF:  You have unusual pain at the groin site or down the affected leg.   You have redness, warmth, swelling, or pain at the groin site.   You have drainage (other than a small amount of blood on the dressing).   You have chills.   You have a fever or persistent symptoms for more than 72 hours.   You have a fever and your symptoms suddenly get worse.   Your leg becomes pale, cool, tingly, or numb.  You have heavy bleeding from the site. Hold pressure on the site. . _____________     Dennis Bast have received care from [facility] during this hospital stay and we look  forward to continuing to provide you with excellent care in our office settings after you've left the hospital.  In order to assure a smoother transition to home following your discharge from the hospital, we will likely have you see one of our nurse practitioners or physician assistants within a few weeks of discharge.  Our advanced practice providers work closely with your physician in order to address all of your heart's needs in a timely manner.  More information about all of our providers may be found here: [email or web address]  Please plan to bring all of your prescriptions to your follow-up appointment and don't hesitate to contact us with questions or concerns.  [facility] [phone] - [place] [phone] [facility] [name] - [phone] [place] - [phone] [facility] [place] [phone] [place] [phone] _____________     [employer] Habits of Highly Healthy [place] wants to help you get well and stay well.  Live a longer, healthier life by practicing healthy habits every day.  1.  Visit your primary care provider regularly. 2.  Make time for family and friends.  Healthy relationships are important. 3.  Take medications as directed by your provider. 4.  Maintain a healthy weight and  a trim waistline. 5.  Eat healthy meals and snacks, rich in fruits, vegetables, whole grains, and lean proteins. 6.  Get moving every day - aim for 150 minutes of moderate physical activity each week. 7.  Don't smoke. 8.  Avoid alcohol or drink in moderation. 9.  Manage stress through meditation or mindful relaxation. 10.  Get seven to nine hours of quality sleep each night.  Want more information on healthy habits?  To learn more  about these and other healthy habits, visit [email or web address]. _____________

## 2017-04-16 ENCOUNTER — Telehealth (HOSPITAL_COMMUNITY): Payer: Self-pay

## 2017-04-16 ENCOUNTER — Telehealth: Payer: Self-pay | Admitting: *Deleted

## 2017-04-16 NOTE — Telephone Encounter (Signed)
-----   Message from Theora Gianotti, NP sent at 04/15/2017 11:29 AM EST ----- Hessie Dibble,  Mr. Fayson is going home today 1/6.  Would you please reach out to him for a TCM call on Monday?  He has f/u with Curt Bears next week.  Thanks,  Gerald Stabs

## 2017-04-16 NOTE — Telephone Encounter (Signed)
Patients insurance is active and benefits verified through [facility] - $10.00 co-pay, no deductible, out of pocket amount of $5,400/$0.00 has been met, no co-insurance, and no pre-authorization is required. [name] - Reference #[id number]  Patient will be contacted and scheduled after review by the RN Navigator.

## 2017-04-16 NOTE — Telephone Encounter (Signed)
TOC call attempt #1-left message to call back.

## 2017-04-17 NOTE — Telephone Encounter (Signed)
Left message to call back  

## 2017-04-17 NOTE — Telephone Encounter (Signed)
Patient contacted regarding discharge from [facility] on 04/15/17.  Patient understands to follow up with provider LAWRENCE on 1/141/19 at 8 AM at [facility]. Patient understands discharge instructions? yes  Patient understands medications and regiment? yes  Patient understands to bring all medications to this visit? yes

## 2017-04-22 NOTE — Progress Notes (Signed)
Cardiology Office Note   Date:  04/23/2017   ID:  Robert Burns, DOB October 23, 1940, MRN [id number]  PCP:  [name], MD  Cardiologist: Quay Burow, MD Chief Complaint  Patient presents with  . Follow-up    TOC   . Hospitalization Follow-up  . Coronary Artery Disease     History of Present Illness: Robert Burns is a 77 y.o. male who presents for post hospital follow up after admission for NSTEMI. He has a history of CAD, with CABG (LIMA to LAD, SVG to Dx, sequential vein graft to RI and OM branch, VG to PDA); Hyperlipidemia, HTN, SSS s/p PPM (Medtronic Adapta), PAD, and OSA on CPAP.    He has subsequent cardiac catheterization requiring DES to the proximal VG to the Ostial 1st Marginal and 2nd Marginal. He had a period of no reflow post cath with some chest pressure and was watched on step down overnight.Metoprolol and isosorbide were titrated, and Ranexa was added. He is here for close follow up.   He comes today with complaints of constant headache, neck soreness and shoulder soreness, with chest tightness.  He states that the tightness is there all the time does not come and go is not associated with shortness of breath weakness or diaphoresis.  He states he noticed it while he was in the hospital, when he was on nitroglycerin drip and has continued since being home.  He offers no complaints of chest pain which he experienced prior to his catheterization.  He has been medically compliant.  Denies any side effects concerning nausea, bleeding, or weakness since discharge.  Past Medical History:  Diagnosis Date  . Arthritis    "all over" (11/25/2015)  . CAD (coronary artery disease)    a. s/p CABG  06/2002; b. 02/01/15 PCI: DES to prox SVG to PDA, staged PCI of SVG to Diag in 02/2015; c. 04/2017 Cath/PCI: LM nl, LAD 100ost, 23m, 75d, LCX 60ost, OM2 80, RCA 100ost, RPDA 80, LIMA->LAD ok, VG->D1 patent stent, VG->RPDA patent stent, 50p, VG->OM1->OM2 90p (3.0x24 Synergy DES), 100  between OM1->OM2 (med rx).  . Cancer ([place])    Right Shoulder, Left Leg- BCC, SCC, AND MELANOMA  . Hyperlipidemia   . Hypertension   . Leg pain   . OSA on CPAP   . PAD (peripheral artery disease) ([place])    a. 10/2002 L SFA PTA/BMS; b. 8/17 LE Angio: LEIA 90 (9x40 self exp stent), LSFA short segment prox occlusion (staged PTA/stenting 01/03/2016), patent mid stent, RSFA 71m (staged PTA/DEB 02/14/2016).  . Presence of permanent cardiac pacemaker   . SSS (sick sinus syndrome) ([place])    a. s/p PPM in 2007 with gen change 04/2015 - Medtronic Adapta ADDRL1, ser # [id number] H.  . Type II diabetes mellitus ([place])   . Urgency of urination     Past Surgical History:  Procedure Laterality Date  . CARDIAC CATHETERIZATION N/A 02/01/2015   Procedure: Left Heart Cath and Coronary Angiography;  Surgeon: Lorretta Harp, MD;  Location: [facility];  Service: Cardiovascular;  Laterality: N/A;  . CARDIAC CATHETERIZATION N/A 02/01/2015   Procedure: Coronary Stent Intervention;  Surgeon: Lorretta Harp, MD;  Location: [facility];  Service: Cardiovascular;  Laterality: N/A;  . CARDIAC CATHETERIZATION  06/2002   "just before bypass OR"  . CARDIAC CATHETERIZATION N/A 03/01/2015   Procedure: Coronary Stent Intervention;  Surgeon: Lorretta Harp, MD;  Location: [place] [facility];  Service: Cardiovascular;  Laterality: N/A;  . CORONARY  ANGIOPLASTY    . CORONARY ARTERY BYPASS GRAFT  06/2002   x5, LIMA-LAD;VG- Diag; seq VG- ramus & OM branch; VG-PDA  . CORONARY STENT INTERVENTION N/A 04/12/2017   Procedure: CORONARY STENT INTERVENTION;  Surgeon: Lorretta Harp, MD;  Location: [facility] [facility];  Service: Cardiovascular;  Laterality: N/A;  . EP IMPLANTABLE DEVICE N/A 04/23/2015   Procedure: PPM Generator Changeout;  Surgeon: Sanda Klein, MD;  Location: [place] [facility];  Service: Cardiovascular;  Laterality: N/A;  . FEMORAL ARTERY STENT Left ~ 2014   "taken out of my leg; couldn' catorgorize what  kind so the put it under all 3"; cataroziepitheloid hemanioendotheliomau  . FOOT FRACTURE SURGERY Left 1973  . FRACTURE SURGERY    . INSERT / REPLACE / REMOVE PACEMAKER  10/13/05   right side, medtronic Adapta  . KNEE HARDWARE REMOVAL Right 1950's   "3-4 months after the insertion"  . KNEE SURGERY Right 1950's   "broke my lower leg; had to put pin in my knee to keep lower leg in place til it healed"  . LEFT HEART CATH AND CORS/GRAFTS ANGIOGRAPHY N/A 04/12/2017   Procedure: LEFT HEART CATH AND CORS/GRAFTS ANGIOGRAPHY;  Surgeon: Lorretta Harp, MD;  Location: [place] [facility];  Service: Cardiovascular;  Laterality: N/A;  . PERIPHERAL VASCULAR CATHETERIZATION N/A 11/25/2015   Procedure: Lower Extremity Angiography;  Surgeon: Lorretta Harp, MD;  Location: [facility] [place] [facility];  Service: Cardiovascular;  Laterality: N/A;  . PERIPHERAL VASCULAR CATHETERIZATION Left 11/25/2015   Procedure: Peripheral Vascular Intervention;  Surgeon: Lorretta Harp, MD;  Location: [place] [facility];  Service: Cardiovascular;  Laterality: Left;  external iliac  . PERIPHERAL VASCULAR CATHETERIZATION N/A 01/03/2016   Procedure: Lower Extremity Angiography;  Surgeon: Lorretta Harp, MD;  Location: [place] [facility];  Service: Cardiovascular;  Laterality: N/A;  . PERIPHERAL VASCULAR CATHETERIZATION Left 01/03/2016   Procedure: Peripheral Vascular Intervention;  Surgeon: Lorretta Harp, MD;  Location: [place] [facility];  Service: Cardiovascular;  Laterality: Left;  SFA  . PERIPHERAL VASCULAR CATHETERIZATION Right 02/14/2016   Procedure: Peripheral Vascular Atherectomy;  Surgeon: Lorretta Harp, MD;  Location: [place] [facility];  Service: Cardiovascular;  Laterality: Right;  SFA  . POPLITEAL ARTERY STENT  01/03/2016   Contralateral access with a 7 French crossover sheath (second order catheter placement)  . TONSILLECTOMY AND ADENOIDECTOMY    . TUMOR EXCISION Right ~ 2005   cancerous tumor removed from shoulder  .  TUMOR EXCISION Right ~ 2000   benign tumor removed from under shoulder  . TUMOR EXCISION Left 06/02/2010   resection of Lt SFA wth interposition of Gore-Tex graft     Current Outpatient Medications  Medication Sig Dispense Refill  . amLODipine (NORVASC) 10 MG tablet Take 10 mg by mouth every evening.     [name] aspirin EC 81 MG tablet Take 81 mg by mouth every evening.     . Cholecalciferol (VITAMIN D3) 2000 units capsule Take 2,000 Units by mouth 2 (two) times daily.    . clopidogrel (PLAVIX) 75 MG tablet Take 1 tablet (75 mg total) by mouth daily with breakfast. 90 tablet 3  . doxylamine, Sleep, (UNISOM) 25 MG tablet Take 25 mg by mouth at bedtime.    [name] glipiZIDE (GLUCOTROL XL) 5 MG 24 hr tablet Take 5 mg by mouth 2 (two) times daily.    . isosorbide mononitrate (IMDUR) 60 MG 24 hr tablet Take 1.5 tablets (90 mg total) by mouth  daily. 45 tablet 6  . losartan-hydrochlorothiazide (HYZAAR) 100-25 MG per tablet Take 1 tablet by mouth daily.      [name] lovastatin (MEVACOR) 40 MG tablet Take 40 mg by mouth at bedtime.      . metFORMIN (GLUCOPHAGE) 1000 MG tablet Take 1,000 mg by mouth 2 (two) times daily with a meal.      . metoprolol tartrate (LOPRESSOR) 100 MG tablet Take 1 tablet (100 mg total) by mouth 2 (two) times daily. 60 tablet 6  . Multiple Vitamin (MULTIVITAMIN) capsule Take 1 capsule by mouth daily.      . nitroGLYCERIN (NITROSTAT) 0.4 MG SL tablet Place 1 tablet (0.4 mg total) under the tongue every 5 (five) minutes as needed for chest pain. (Patient taking differently: Place 1 tablet (0.4 mg total) under the tongue every 5 (five) minutes as needed for chest pain.) 25 tablet 3  . potassium chloride SA (K-DUR,KLOR-CON) 20 MEQ tablet Take 1 tablet (20 mEq total) by mouth daily. (Patient taking differently: Take 20 mEq by mouth every evening. ) 15 tablet 0  . ranolazine (RANEXA) 1000 MG SR tablet Take 1 tablet (1,000 mg total) by mouth 2 (two) times daily. 60 tablet 6  . rOPINIRole (REQUIP) 1 MG  tablet Take 1 mg by mouth at bedtime.    . sodium chloride (OCEAN) 0.65 % SOLN nasal spray Place 1 spray into both nostrils as needed for congestion.     . tamsulosin (FLOMAX) 0.4 MG CAPS capsule Take 0.4 mg by mouth daily.      No current facility-administered medications for this visit.     Allergies:   Peanut-containing drug products; Ace inhibitors; Clonidine hcl; Coreg [carvedilol]; Diltiazem hcl; and Mobic [meloxicam]    Social History:  The patient  reports that he quit smoking about 46 years ago. His smoking use included pipe. He quit after 10.00 years of use. he has never used smokeless tobacco. He reports that he does not drink alcohol or use drugs.   Family History:  The patient's family history includes Coronary artery disease in his mother; Diabetes in his father and sister; Heart attack in his father and mother; Heart disease in his father, mother, and sister; Hyperlipidemia in his father; Hypertension in his mother and sister.    ROS: All other systems are reviewed and negative. Unless otherwise mentioned in H&P    PHYSICAL EXAM: VS:  BP (!) 146/72   Pulse 84   Ht 5' 7.5" (1.715 m)   Wt 180 lb 9.6 oz (81.9 kg)   BMI 27.87 kg/m  , BMI Body mass index is 27.87 kg/m. GEN: Well nourished, well developed, in no acute distress  HEENT: normal  Neck: no JVD, carotid bruits, or masses Cardiac: RRR; no murmurs, rubs, or gallops,no edema  Respiratory:  clear to auscultation bilaterally, normal work of breathing GI: soft, nontender, nondistended, + BS MS: no deformity or atrophy pacemaker in right upper chest.  No evidence of ecchymosis hematoma or bleeding from right groin catheter insertion site. Skin: warm and dry, no rash Neuro:  Strength and sensation are intact Psych: euthymic mood, full affect   EKG: Atrial paced rhythm, heart rate 84 bpm, with evidence of septal infarct.  Recent Labs: 03/28/2017: TSH 0.964 04/13/2017: Hemoglobin 13.3; Platelets 146 04/14/2017: BUN  12; Creatinine, Ser 0.99; Potassium 3.8; Sodium 145    Lipid Panel No results found for: CHOL, TRIG, HDL, CHOLHDL, VLDL, LDLCALC, LDLDIRECT    Wt Readings from Last 3 Encounters:  04/23/17 180  lb 9.6 oz (81.9 kg)  04/15/17 184 lb 11.9 oz (83.8 kg)  03/28/17 186 lb 3.2 oz (84.5 kg)      Other studies Reviewed: Cardiac Cath 04/12/2017  Diagnostic  Dominance: Right       Left Anterior Descending  Ost LAD to Prox LAD lesion 100% stenosed  Ost LAD to Prox LAD lesion is 100% stenosed.  Mid LAD lesion 50% stenosed  Mid LAD lesion is 50% stenosed.  Mid LAD to Dist LAD lesion 75% stenosed  Mid LAD to Dist LAD lesion is 75% stenosed.  Left Circumflex    Ost Cx lesion 60% stenosed    Ost Cx lesion is 60% stenosed.    Second Obtuse Marginal Branch  Ost 2nd Mrg to 2nd Mrg lesion 80% stenosed  Ost 2nd Mrg to 2nd Mrg lesion is 80% stenosed.  Right Coronary Artery  Ost RCA to Prox RCA lesion 100% stenosed  Ost RCA to Prox RCA lesion is 100% stenosed.  Right Posterior Descending Artery   RPDA lesion 80% stenosed   RPDA lesion is 80% stenosed.   LIMA Graft to Mid LAD     Graft to RPDA  Origin to Prox Graft lesion 0% stenosed  Previously placed Origin to Prox Graft stent (unknown type) is widely patent.  Graft to Ost 1st Diag  Origin to Prox Graft lesion 0% stenosed  Previously placed Origin to Prox Graft stent (unknown type) is widely patent.  Prox Graft lesion 50% stenosed  Prox Graft lesion is 50% stenosed.  Dist Graft lesion 0% stenosed  Previously placed Dist Graft stent (unknown type) is widely patent.  Sequential Graft to Ost 1st Mrg, 2nd Mrg  Origin to Prox Graft lesion before Ost 1st Mrg 90% stenosed  **The proximal VG was successfully stented using a 3.0 x 53mm Synergy DES.  Prox Graft to Mid Graft lesion between Ost 1st Mrg and 2nd Mrg 100% stenosed  Prox Graft to Mid Graft lesion between [facility] and 2nd Mrg is 100% stenosed.  Intervention   Origin to Prox Graft  lesion (Sequential Graft to Ost 1st Mrg, 2nd Mrg)  Stent  A stent was successfully placed.  Post-Intervention Lesion Assessment  There is no residual stenosis post intervention.   Echocardiogram 03/26/2017 Left ventricle: The cavity size was normal. Wall thickness was   normal. Systolic function was normal. The estimated ejection   fraction was in the range of 50% to 55%. Wall motion was normal;   there were no regional wall motion abnormalities. Doppler   parameters are consistent with abnormal left ventricular   relaxation (grade 1 diastolic dysfunction). - Left atrium: The atrium was mildly dilated. - Right ventricle: The cavity size was mildly dilated. - Pulmonary arteries: Systolic pressure was mildly increased.  Impressions:  - Low normal LV systolic function; mild diastolic dysfunction; mild   LAE; mild RVE; mild TR with mild pulmonary hypertension.  ASSESSMENT AND PLAN:  1.  Coronary artery disease: Recent cardiac catheterization which required drug-eluting stent to the ostial second marginal lesion at 80% stenosis.  Patient also had a mid LAD to distal LAD 75% stenosis did not have intervention on.  His graft stents were widely patent.Continue DAPT with Plavix and ASA.   He continues on metoprolol 100 mg twice daily which is higher dose prior to discharge.  He also has been started on higher dose of isosorbide mononitrate daily, increased from 60 mg daily prior to discharge.  He has also been started on Ranexa 1000  mg twice daily.  He is tolerating medications, but is complaining of a constant headache.  I have advised that he take Tylenol 2 tablets every 6 hours for the next 3 days to alleviate symptoms.  I have encouraged him to notify us if discomfort did not improve with this.  He will need to be seen sooner on follow-up if this occurs.  Otherwise no other changes in his medication regimen.  2.  Hypertension: Currently not optimal for patient was CAD and diabetes.  The  patient is on amlodipine 10 mg daily, losartan/HCTZ 100/25 mg daily, metoprolol 100 mg twice daily, along with nitrates..  May need to consider adding hydralazine if blood pressure is not be closer to optimal levels on follow-up visit.  No changes in his indication regimen at this time.  We will follow this closely  3.  Type 2 diabetes: Patient continues on metformin and glipizide.  Consider  GLP-1 or SGLT inhibitors in the setting of coronary artery disease.  Will defer to primary care for this.  Current medicines are reviewed at length with the patient today.    Labs/ tests ordered today include: None Phill Myron. [place] Pugh, ANP, AACC   04/23/2017 8:10 AM    [facility]  [address] [place], [place] [address] Phone: [phone]; Fax: [phone]

## 2017-04-23 ENCOUNTER — Encounter: Payer: Self-pay | Admitting: Adult Health

## 2017-04-23 ENCOUNTER — Ambulatory Visit: Payer: Medicare HMO | Admitting: Adult Health

## 2017-04-23 VITALS — BP 146/72 | HR 84 | Ht 67.5 in | Wt 180.6 lb

## 2017-04-23 DIAGNOSIS — E1151 Type 2 diabetes mellitus with diabetic peripheral angiopathy without gangrene: Secondary | ICD-10-CM | POA: Diagnosis not present

## 2017-04-23 DIAGNOSIS — R079 Chest pain, unspecified: Secondary | ICD-10-CM

## 2017-04-23 DIAGNOSIS — I10 Essential (primary) hypertension: Secondary | ICD-10-CM

## 2017-04-23 DIAGNOSIS — I2510 Atherosclerotic heart disease of native coronary artery without angina pectoris: Secondary | ICD-10-CM | POA: Diagnosis not present

## 2017-04-23 NOTE — Patient Instructions (Signed)
Medication Instructions:  PLEASE TAKE TYLENOL (2TAB) EVERY 6 HOURS FOR HEADACHE  If you need a refill on your cardiac medications before your next appointment, please call your pharmacy.  Special Instructions: PLEASE CALL OUR OFFICE IF THE TYLENOL DOES NOT RELIEVE YOUR HEADACHE.  Follow-Up: Your physician wants you to follow-up in: 3 MONTHS WITH DR Gwenlyn Found.    Thank you for choosing [facility] at [facility]!!

## 2017-04-25 ENCOUNTER — Telehealth (HOSPITAL_COMMUNITY): Payer: Self-pay

## 2017-04-25 NOTE — Telephone Encounter (Signed)
Called and spoke with patient in regards to cardiac rehab - patient is interested in the program. He is currently not home to look at his calendar to schedule. He will return phone call.

## 2017-04-30 ENCOUNTER — Telehealth: Payer: Self-pay | Admitting: Cardiovascular Disease

## 2017-04-30 NOTE — Telephone Encounter (Signed)
LMTCB on patient's phone

## 2017-04-30 NOTE — Telephone Encounter (Signed)
New Message   [name] patient daughter is calling with general question. Patient just recently had a stent but is still having pressure in his chest. She states that he has been complaining of headaches as well. They would like to discuss the pressure and headaches further. Please call.

## 2017-05-01 ENCOUNTER — Encounter: Payer: Self-pay | Admitting: Cardiovascular Disease

## 2017-05-01 NOTE — Telephone Encounter (Signed)
Uncertain why headaches continue this far out after being on isosorbide for over a month. It may be musculoskeletal? Not sure if we need to consider re-cath due to no flow post stenting to evaluate further. Please also send this message to Dr. Gwenlyn Found. I am happy to get him scheduled for re-look if he would like to do so.

## 2017-05-01 NOTE — Telephone Encounter (Signed)
Let Me see him back in the office to decide

## 2017-05-01 NOTE — Telephone Encounter (Signed)
Message sent to scheduling to arrange appt. 

## 2017-05-01 NOTE — Telephone Encounter (Signed)
Patient of Dr. Gwenlyn Found (who saw K. Purcell Nails, DNP on 1/14) called in. He c/o continuing chest pressure - has not had to use PRN SL NTG. He is now on increased imdur dose and ranexa (new). He c/o headaches as side effects of medications. He is having to take tylenol 2 capsules q6h which helps some. He would like advice on what he can do for continual symptoms. He reports his VS are OK, but he does not routinely check them. Will defer to DNP to review and advise

## 2017-05-01 NOTE — Telephone Encounter (Signed)
Forwarded to Dr. Gwenlyn Found per request

## 2017-05-02 ENCOUNTER — Telehealth (HOSPITAL_COMMUNITY): Payer: Self-pay

## 2017-05-02 ENCOUNTER — Telehealth: Payer: Self-pay

## 2017-05-02 NOTE — Telephone Encounter (Signed)
F/U Call: Patient calling for update on concern. Message routed the triage via telephone call

## 2017-05-02 NOTE — Telephone Encounter (Signed)
Called to speak with patient in regards to Cardiac Rehab - Patient would like to attend Cardiac Rehab at [facility]. Patient gave me the ok to send information. Closed referral.

## 2017-05-02 NOTE — Telephone Encounter (Signed)
Received a call from patient.He stated he has had a severe headache ever since Isosorbide was increased to 90 mg.Stated he is taking Excedrin with little to no relief.Stated he did not have a headache with the 30 mg.Advised to decrease Isosorbide back to 30 mg daily.Stated chest pressure he was having is better.Advised to keep appointment as planned with Dr.Berry 05/11/17 at 9:15 am.

## 2017-05-11 ENCOUNTER — Encounter: Payer: Self-pay | Admitting: Cardiovascular Disease

## 2017-05-11 ENCOUNTER — Ambulatory Visit: Payer: Medicare HMO | Admitting: Cardiovascular Disease

## 2017-05-11 DIAGNOSIS — I10 Essential (primary) hypertension: Secondary | ICD-10-CM

## 2017-05-11 DIAGNOSIS — I2510 Atherosclerotic heart disease of native coronary artery without angina pectoris: Secondary | ICD-10-CM

## 2017-05-11 NOTE — Assessment & Plan Note (Signed)
History of essential hypertension blood pressure measured at 142/80. He is on losartan, hydrochlorothiazide, metoprolol. Continue current meds at current dosing.

## 2017-05-11 NOTE — Progress Notes (Signed)
05/11/2017 Robert Burns   09-23-1940  [id number]  Primary Physician [name], MD Primary Cardiologist: Lorretta Harp MD [name], [name]  HPI:  Robert Burns is a 77 y.o. male married Caucasian male, father of 51, grandfather to 3 grandchildren whose wife [name] who is also a patient of mine. I last saw him in the office 03/06/17.[name] He has a history of CAD status post coronary artery bypass grafting March 2004 with a LIMA to his LAD, a vein to a diagonal branch, a sequential vein to a ramus and OM branch, as well as a vein to the PDA. Last functional study performed July 28, 2011, was entirely normal. 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 He had a pacemaker placed for sick sinus syndrome November 2008 followed by Dr. Sallyanne Kuster. He has obstructive sleep apnea on CPAP. He complain of left thigh pain and I angiogram'd him revealing patent arteries though he did have a space-occupying lesion which was removed by Dr. Kellie Simmering with placement of an interposition 6-mm Gore-Tex graft. The pathology was uncertain. 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 Dr. Baxter Flattery follows his lipid profile.Since I saw him back 11/07/12 he has done well. Followup Dopplers performed in our office 09/27/12 revealed a high-grade lesion in the distal right SFA with an ABI of 0.3. His left ABI 1.1 without obstructive disease. His most recent lower extremity Doppler Dopplers performed 9/32/16 revealed a right ABI 0.82 And a left ABI of 0.89. Over the last 3 months he's noticed anginal chest pain occurring both at rest and with exertion with left upper extremity radiation. He also complains of left lower extremity discomfort. to moderate anterolateral ischemia. Because of ongoing chest pain and a Myoview that showed anterolateral ischemia and he underwent cardiac catheterization on 02/01/15 revealing high grade segmental proximal right SVG disease and subtotally occluded diagonal  branch SVG. He underwent stenting of his RCA SVG successfully. He does have continued chest pain although somewhat improved since his last procedure. I brought him back for staged diagonal branch SVG intervention on 03/01/15. This was successful and since that time he denies chest pain or shortness of breath. He underwent a generator change by Dr. Sallyanne Kuster in January for end-of-life of his Medtronic pacemaker.Since I saw him 6 months ago he developed new left calf claudication of the last 3 months. Lower extremity arterial Doppler studies performed today revealed a decline in his left ABI from 0.87 down to 0.59 with an occluded left SFA that is a new finding. 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 He had a short segment occlusion of the proximal left SFA with patent mid left SFA stent. He had 90% segmental calcified mid right SFA stenosis with three-vessel runoff bilaterally. I ended up stenting his left external iliac artery with a 9 mm x 40 mm long nitinol self-expanding stent which improved his symptoms somewhat although he continuedto have lifestyle limiting claudication. He underwent staged intervention of his left SFA on 01/03/16. I stented the entire quadrant totally occluded segment with a 6 mm x 250 mm long Viabahn Stent. His claudication on the left has resolved and his ABIs normalized. He now complains of right leg medication. I did demonstrate a 90% calcified segmental mid right SFA stenosis at the time of angiography 11/25/15. He underwent diamondback orbital rotation atherectomy/drug-eluting  angioplasty of his mid calcified right SFA by myself 02/14/16. This follow-up Dopplers performed 02/24/16 revealed normal ABIs bilaterally. His claudication has improved. When I saw him in the office possibly 3 weeks ago he was complaining of fairly new  onset substernal chest pain over the prior several months occurring several times a week. These were new since his RCA and diagonal branch vein graft interventions at the end of 2016. He is on good antianginal medications. A Myoview stress test was ordered that showed subtle inferolateral ischemia and echo showed normal LV function.. Based on this I performed outpatient cardiac catheterization 04/12/17 revealing an occluded sequential limb from the ramus to obtuse marginal branch vein graft with a high grade aorto ostial component that was stented with a 3 mm x 24 mm long synergy drug-eluting stent. This resulted in "no reflow which responded to intra-coronary verapamil and nitroglycerin. His symptoms have markedly improved although his major complaints now are of headache probably related to the higher doses of Imdur which has improved with declining doses. He was also begun on Ranexa.      Current Meds  Medication Sig  . amLODipine (NORVASC) 10 MG tablet Take 10 mg by mouth every evening.   [name] aspirin EC 81 MG tablet Take 81 mg by mouth every evening.   . Cholecalciferol (VITAMIN D3) 2000 units capsule Take 2,000 Units by mouth 2 (two) times daily.  . clopidogrel (PLAVIX) 75 MG tablet Take 1 tablet (75 mg total) by mouth daily with breakfast.  . doxylamine, Sleep, (UNISOM) 25 MG tablet Take 25 mg by mouth at bedtime.  [name] glipiZIDE (GLUCOTROL XL) 5 MG 24 hr tablet Take 5 mg by mouth 2 (two) times daily.  . isosorbide mononitrate (IMDUR) 30 MG 24 hr tablet Take 30 mg by mouth daily.  [name] losartan-hydrochlorothiazide (HYZAAR) 100-25 MG per tablet Take 1 tablet by mouth daily.    [name] lovastatin (MEVACOR) 40 MG tablet Take 40 mg by mouth at bedtime.    . metFORMIN (GLUCOPHAGE) 1000 MG tablet Take 1,000 mg by mouth 2 (two) times daily with a meal.    . metoprolol tartrate (LOPRESSOR) 100 MG tablet Take 1 tablet (100 mg total) by mouth 2 (two) times daily.  . Multiple Vitamin (MULTIVITAMIN) capsule Take 1  capsule by mouth daily.    . nitroGLYCERIN (NITROSTAT) 0.4 MG SL tablet Place 1 tablet (0.4 mg total) under the tongue every 5 (five) minutes as needed for chest pain. (Patient taking differently: Place 1 tablet (0.4 mg total) under the tongue every 5 (five) minutes as needed for chest pain.)  . potassium chloride SA (K-DUR,KLOR-CON) 20 MEQ tablet Take 1 tablet (20 mEq total) by mouth daily. (Patient taking differently: Take 20 mEq by mouth every evening. )  . ranolazine (RANEXA) 1000 MG SR tablet Take 1 tablet (1,000 mg total) by mouth 2 (two) times daily.  [name] rOPINIRole (REQUIP) 1 MG tablet Take 1 mg by mouth at bedtime.  . sodium chloride (OCEAN) 0.65 % SOLN nasal spray Place 1 spray into both nostrils as needed for congestion.   . tamsulosin (FLOMAX) 0.4 MG CAPS capsule Take 0.4 mg by mouth daily.      Allergies  Allergen Reactions  . Peanut-Containing Drug Products Anaphylaxis and Other (See Comments)    Tongue swelling is severe  . Ace Inhibitors Other (See Comments)    Cough  . Clonidine Hcl Other (See Comments)    Patch only - skin irritation  . Coreg [Carvedilol] Itching  .  Diltiazem Hcl Other (See Comments)    Unknown  . Mobic [Meloxicam] Other (See Comments)    GI upset    Social History   Socioeconomic History  . Marital status: Married    Spouse name: Not on file  . Number of children: Not on file  . Years of education: Not on file  . Highest education level: Not on file  Social Needs  . Financial resource strain: Not on file  . Food insecurity - worry: Not on file  . Food insecurity - inability: Not on file  . Transportation needs - medical: Not on file  . Transportation needs - non-medical: Not on file  Occupational History  . Not on file  Tobacco Use  . Smoking status: Former Smoker    Years: 10.00    Types: Pipe    Last attempt to quit: 1973    Years since quitting: 46.1  . Smokeless tobacco: Never Used  . Tobacco comment: "quit smoking in 1973"    Substance and Sexual Activity  . Alcohol use: No  . Drug use: No  . Sexual activity: Not Currently  Other Topics Concern  . Not on file  Social History Narrative  . Not on file     Review of Systems: General: negative for chills, fever, night sweats or weight changes.  Cardiovascular: negative for chest pain, dyspnea on exertion, edema, orthopnea, palpitations, paroxysmal nocturnal dyspnea or shortness of breath Dermatological: negative for rash Respiratory: negative for cough or wheezing Urologic: negative for hematuria Abdominal: negative for nausea, vomiting, diarrhea, bright red blood per rectum, melena, or hematemesis Neurologic: negative for visual changes, syncope, or dizziness All other systems reviewed and are otherwise negative except as noted above.    Blood pressure (!) 142/80, pulse 84, height 5\' 7"  (1.702 m), weight 183 lb 8 oz (83.2 kg).  General appearance: alert and no distress Neck: no adenopathy, no carotid bruit, no JVD, supple, symmetrical, trachea midline and thyroid not enlarged, symmetric, no tenderness/mass/nodules Lungs: clear to auscultation bilaterally Heart: regular rate and rhythm, S1, S2 normal, no murmur, click, rub or gallop Extremities: extremities normal, atraumatic, no cyanosis or edema Pulses: 2+ and symmetric Skin: Skin color, texture, turgor normal. No rashes or lesions Neurologic: Alert and oriented X 3, normal strength and tone. Normal symmetric reflexes. Normal coordination and gait  EKG not performed today  ASSESSMENT AND PLAN:   Essential hypertension History of essential hypertension blood pressure measured at 142/80. He is on losartan, hydrochlorothiazide, metoprolol. Continue current meds at current dosing.  CAD -CABG 2004 LIMA-LAD, VG-Dx, seq VG -RI & OM branch; VG-PDA  History of CAD status post coronary artery bypass grafting March 2004 with a LIMA to the LAD, vein to diagonal branch, sequential vein to ramus and OM as well  as a vein to the PDA. He said multiple catheterizations since that time including a RCA and diagonal branch being graft intervention in 2016. Because of recurrent pain Myoview stress test was performed that showed inferolateral ischemia which led to cardiac catheterization by myself 04/12/17 which revealed a high-grade aorto ostial SVG stenosis in the graft to the ramus branch and obtuse marginal branch sequentially. The sequential limb was occluded which was a new finding. I stented his proximal obtuse marginal branch vein graft with a 3 mm x 24 mm long surgery drug-eluting stent. He did have "no reflow which responded to intracoronary nitroglycerin and verapamil. His symptoms have resolved although he does complain of a headache probably related to increased  his Imdur during his hospitalization which has subsequently been down titrated improving his headache symptoms. He was also started on Ranexa.      Lorretta Harp MD FACP,FACC,FAHA, [facility] 05/11/2017 9:39 AM

## 2017-05-11 NOTE — Assessment & Plan Note (Signed)
History of CAD status post coronary artery bypass grafting March 2004 with a LIMA to the LAD, vein to diagonal branch, sequential vein to ramus and OM as well as a vein to the PDA. He said multiple catheterizations since that time including a RCA and diagonal branch being graft intervention in 2016. Because of recurrent pain Myoview stress test was performed that showed inferolateral ischemia which led to cardiac catheterization by myself 04/12/17 which revealed a high-grade aorto ostial SVG stenosis in the graft to the ramus branch and obtuse marginal branch sequentially. The sequential limb was occluded which was a new finding. I stented his proximal obtuse marginal branch vein graft with a 3 mm x 24 mm long surgery drug-eluting stent. He did have "no reflow which responded to intracoronary nitroglycerin and verapamil. His symptoms have resolved although he does complain of a headache probably related to increased his Imdur during his hospitalization which has subsequently been down titrated improving his headache symptoms. He was also started on Ranexa.

## 2017-05-11 NOTE — Patient Instructions (Signed)
Medication Instructions: Your physician recommends that you continue on your current medications as directed. Please refer to the Current Medication list given to you today.   Follow-Up: We request that you follow-up in: 3 months with Kathryn Lawrence, NP and in 6 months with Dr Berry  You will receive a reminder letter in the mail two months in advance. If you don't receive a letter, please call our office to schedule the follow-up appointment.  If you need a refill on your cardiac medications before your next appointment, please call your pharmacy.  

## 2017-05-22 NOTE — Telephone Encounter (Signed)
Left message for pt to call.

## 2017-05-22 NOTE — Telephone Encounter (Signed)
New message    Since he was released from the hospital he thinks he developed a allergy to one of his medication   Pt c/o medication issue:  1. Name of Medication:  metoprolol tartrate (LOPRESSOR) 100 MG tablet Take 1 tablet (100 mg total) by mouth 2 (two) times daily.   Patient states he took a lower dosage of the metoprolol for years does think this is causing it   ranolazine (RANEXA) 1000 MG SR tablet Take 1 tablet (1,000 mg total) by mouth 2 (two) times daily.   (this is a new medication for his, he is not sure if this is causing the side effects or allergy)  2. How are you currently taking this medication (dosage and times per day)? 2x a day   3. Are you having a reaction (difficulty breathing--STAT)? yes  4. What is your medication issue? Severe sneezing swelling of lips, cough, feels like he is strangling

## 2017-05-23 NOTE — Telephone Encounter (Signed)
Left a message to call back.   Per pharmd, the patient should hold the Ranexa for a few days and then call back to inform us how the symptoms are at this point.

## 2017-05-23 NOTE — Telephone Encounter (Signed)
Patient called in stating that he feels like the Metoprolol or Ranexa is causing him to have an allergic reaction. He has been having swollen lips, a gagging sensation and a runny nose for the past few days. He stated that he has not tried any new foods or other new medications. Patient has been informed that we will talk to our pharmacist and call him back.

## 2017-05-24 NOTE — Telephone Encounter (Signed)
Left detailed message for patient of pharm md recommendations.

## 2017-05-24 NOTE — Telephone Encounter (Signed)
Spoke with pt, aware of pharm md recommendations. 

## 2017-06-11 ENCOUNTER — Telehealth: Payer: Self-pay | Admitting: Cardiovascular Disease

## 2017-06-11 NOTE — Telephone Encounter (Signed)
New message   Patient calling with concerns about medication being increased. Please call  Pt c/o medication issue:  1. Name of Medication: metoprolol tartrate (LOPRESSOR) 100 MG tablet  2. How are you currently taking this medication (dosage and times per day)? Take 1 tablet (100 mg total) by mouth 2 (two) times daily.  3. Are you having a reaction (difficulty breathing--STAT)? No  4. What is your medication issue? Headache, coughing

## 2017-06-11 NOTE — Telephone Encounter (Signed)
Left message for pt to call.

## 2017-06-12 NOTE — Telephone Encounter (Signed)
Received call from patient, patient reports he is experiencing a continued HA.  States he has called previously due to a HA and there have been medication changes to try to help this.  Patient states theses changes did help but he is still experiencing this.   States he is also experiencing a gagging sensation and dry cough 2-3 x daily.   States its like a "beta blocker" cough.   Patient states after his stents were placed the only medication changes were isosorbide (has been decreased since), metoprolol dosage increase, and Ranexa.     Patient states he is still wondering if these side effects can be due to increase in metoprolol.   Patient states he is currently attending cardiac rehab and they monitor his BP and everything has been stable.   Patient states if Dr. Gwenlyn Found doesn't think it is his medication he will go to his PCP.      Forwarded to Dr. Gwenlyn Found to review.     Chart review: 1/21, patient called c/o HA.  Telephone note 1/23-patient c/o HA, advised to decrease Imdur back to 30 mg daily.   2/12-patient called c/o HA and sneezing, swelling of lips, cough, strangling feeling-advised to hold Ranexa for a few days to see if this helped.

## 2017-06-12 NOTE — Telephone Encounter (Signed)
Left message to call back  

## 2017-06-13 NOTE — Telephone Encounter (Signed)
Have him go see his PCP first to evaluate

## 2017-06-18 NOTE — Telephone Encounter (Signed)
Left detail message with recomendations, ok per DPR, and to call back if any questions.

## 2017-07-05 ENCOUNTER — Ambulatory Visit (INDEPENDENT_AMBULATORY_CARE_PROVIDER_SITE_OTHER): Payer: Medicare HMO | Admitting: *Deleted

## 2017-07-05 DIAGNOSIS — I495 Sick sinus syndrome: Secondary | ICD-10-CM

## 2017-07-06 NOTE — Progress Notes (Signed)
Remote pacemaker transmission.   

## 2017-07-10 ENCOUNTER — Encounter: Payer: Self-pay | Admitting: Cardiology

## 2017-07-18 ENCOUNTER — Ambulatory Visit: Payer: Medicare HMO | Admitting: Cardiovascular Disease

## 2017-07-21 LAB — CUP PACEART REMOTE DEVICE CHECK
Battery Impedance: 162 Ohm
Brady Statistic AP VP Percent: 1 %
Brady Statistic AP VS Percent: 99 %
Brady Statistic AS VP Percent: 0 %
Brady Statistic AS VS Percent: 1 %
Date Time Interrogation Session: 20190329001345
Implantable Lead Implant Date: 20070706
Implantable Lead Location: 753859
Implantable Lead Model: 5092
Implantable Pulse Generator Implant Date: 20170113
Lead Channel Impedance Value: 447 Ohm
Lead Channel Impedance Value: 671 Ohm
Lead Channel Pacing Threshold Amplitude: 1.125 V
Lead Channel Pacing Threshold Pulse Width: 0.4 ms
Lead Channel Setting Pacing Amplitude: 3.25 V
Lead Channel Setting Sensing Sensitivity: 2.8 mV
MDC IDC LEAD IMPLANT DT: 20070706
MDC IDC LEAD LOCATION: 753860
MDC IDC MSMT BATTERY REMAINING LONGEVITY: 130 mo
MDC IDC MSMT BATTERY VOLTAGE: 2.8 V
MDC IDC MSMT LEADCHNL RV PACING THRESHOLD AMPLITUDE: 1.625 V
MDC IDC MSMT LEADCHNL RV PACING THRESHOLD PULSEWIDTH: 0.4 ms
MDC IDC SET LEADCHNL RA PACING AMPLITUDE: 2.25 V
MDC IDC SET LEADCHNL RV PACING PULSEWIDTH: 0.4 ms

## 2017-07-27 ENCOUNTER — Telehealth: Payer: Self-pay | Admitting: Cardiovascular Disease

## 2017-07-27 NOTE — Telephone Encounter (Signed)
New Message:    [phone]   Needs a diagnosis code for his TSH please.

## 2017-07-27 NOTE — Telephone Encounter (Signed)
Routed to primary [place] Per chart review, last TSH was 03/28/17

## 2017-08-06 NOTE — Progress Notes (Signed)
Cardiology Office Note   Date:  08/07/2017   ID:  Robert Burns, DOB [birth year], MRN [id number]  PCP:  [name], MD  Cardiologist:  [facility]  Chief Complaint  Patient presents with  . Follow-up     History of Present Illness: Robert Burns is a 77 y.o. male who presents for ongoing assessment and management of CAD, with history of CABG in 06/2002 with LIMA to LAD, vein graft to diagonal branch, SVG to ramus and OM branch and vein graft to the PDA.   He has PVOD and is s/p left SFA/PTA and stenting on 10/2002. He has PPM in situ which was placed for SSS 02/2007, OSA on CPAP. Due to leg pain he has been found to have space occupying lesion which was removed by Dr. Kellie Simmering with placement of interposition 6 mm Gore-Tex graft. He has ongoing neuropathic pain.   Due to recurrent chest pain a repeat cardiac cath was completed on 02/01/2015 which revealed a high grade segmental proximal right SVG disease and subtotally occluded diagonal branch SVG. He required stenting of his RCA/SVG 02/01/2015, and staged diagonal branch SVG intervention on 03/01/2015.   Due to recurrent claudication symptoms he required stenting of his left external iliac artery and staged intervention of a 90% calcified segmental mid right SFA 11/25/2015 with diamond back orbital rotational atherectomy/ DES of his mid calcified SFA.  He had repeat cardiac cath on 04/12/2017 revealing an occluded sequential limb form the ramus to the OM vein graft with a high grade aorto-ostial component that required a DES. He was placed on Ranexa and Imdur.   He comes today with complaints of a frequent "strangleing cough."  He has been seem bu his PCP who paed him on PPI for GERD which was thought to be contributing to this. He was seen bu ENT who did not find any abnormalities on exam. Due to this he is concerned that the losartan may be causing this   H has stopped taking Ranexa as this was causing him to have headaches.  After stopping  Ranexa he felt much better, and therefore this needs to be removed from his medication list.  Other than that, he has been asymptomatic he does have some generalized aches and pains in his joints but this does not prevent him from his daily activities.  Past Medical History:  Diagnosis Date  . Arthritis    "all over" (11/25/2015)  . CAD (coronary artery disease)    a. s/p CABG  06/2002; b. 02/01/15 PCI: DES to prox SVG to PDA, staged PCI of SVG to Diag in 02/2015; c. 04/2017 Cath/PCI: LM nl, LAD 100ost, 74m, 75d, LCX 60ost, OM2 80, RCA 100ost, RPDA 80, LIMA->LAD ok, VG->D1 patent stent, VG->RPDA patent stent, 50p, VG->OM1->OM2 90p (3.0x24 Synergy DES), 100 between OM1->OM2 (med rx).  . Cancer ([place])    Right Shoulder, Left Leg- BCC, SCC, AND MELANOMA  . Hyperlipidemia   . Hypertension   . Leg pain   . OSA on CPAP   . PAD (peripheral artery disease) ([place])    a. 10/2002 L SFA PTA/BMS; b. 8/17 LE Angio: LEIA 90 (9x40 self exp stent), LSFA short segment prox occlusion (staged PTA/stenting 01/03/2016), patent mid stent, RSFA 27m (staged PTA/DEB 02/14/2016).  . Presence of permanent cardiac pacemaker   . SSS (sick sinus syndrome) ([place])    a. s/p PPM in 2007 with gen change 04/2015 - Medtronic Adapta ADDRL1, ser # [id number] H.  . Type II diabetes mellitus ([place])   .  Urgency of urination     Past Surgical History:  Procedure Laterality Date  . CARDIAC CATHETERIZATION N/A 02/01/2015   Procedure: Left Heart Cath and Coronary Angiography;  Surgeon: Lorretta Harp, MD;  Location: [place] [facility];  Service: Cardiovascular;  Laterality: N/A;  . CARDIAC CATHETERIZATION N/A 02/01/2015   Procedure: Coronary Stent Intervention;  Surgeon: Lorretta Harp, MD;  Location: [place] [facility];  Service: Cardiovascular;  Laterality: N/A;  . CARDIAC CATHETERIZATION  06/2002   "just before bypass OR"  . CARDIAC CATHETERIZATION N/A 03/01/2015   Procedure: Coronary Stent Intervention;  Surgeon: Lorretta Harp, MD;   Location: [place] [facility];  Service: Cardiovascular;  Laterality: N/A;  . CORONARY ANGIOPLASTY    . CORONARY ARTERY BYPASS GRAFT  06/2002   x5, LIMA-LAD;VG- Diag; seq VG- ramus & OM branch; VG-PDA  . CORONARY STENT INTERVENTION N/A 04/12/2017   Procedure: CORONARY STENT INTERVENTION;  Surgeon: Lorretta Harp, MD;  Location: [place] [facility];  Service: Cardiovascular;  Laterality: N/A;  . EP IMPLANTABLE DEVICE N/A 04/23/2015   Procedure: PPM Generator Changeout;  Surgeon: Sanda Klein, MD;  Location: [place] [facility];  Service: Cardiovascular;  Laterality: N/A;  . FEMORAL ARTERY STENT Left ~ 2014   "taken out of my leg; couldn' catorgorize what kind so the put it under all 3"; cataroziepitheloid hemanioendotheliomau  . FOOT FRACTURE SURGERY Left 1973  . FRACTURE SURGERY    . INSERT / REPLACE / REMOVE PACEMAKER  10/13/05   right side, medtronic Adapta  . KNEE HARDWARE REMOVAL Right 1950's   "3-4 months after the insertion"  . KNEE SURGERY Right 1950's   "broke my lower leg; had to put pin in my knee to keep lower leg in place til it healed"  . LEFT HEART CATH AND CORS/GRAFTS ANGIOGRAPHY N/A 04/12/2017   Procedure: LEFT HEART CATH AND CORS/GRAFTS ANGIOGRAPHY;  Surgeon: Lorretta Harp, MD;  Location: [place] [facility];  Service: Cardiovascular;  Laterality: N/A;  . PERIPHERAL VASCULAR CATHETERIZATION N/A 11/25/2015   Procedure: Lower Extremity Angiography;  Surgeon: Lorretta Harp, MD;  Location: [place] [facility];  Service: Cardiovascular;  Laterality: N/A;  . PERIPHERAL VASCULAR CATHETERIZATION Left 11/25/2015   Procedure: Peripheral Vascular Intervention;  Surgeon: Lorretta Harp, MD;  Location: [place] [facility];  Service: Cardiovascular;  Laterality: Left;  external iliac  . PERIPHERAL VASCULAR CATHETERIZATION N/A 01/03/2016   Procedure: Lower Extremity Angiography;  Surgeon: Lorretta Harp, MD;  Location: [place] [facility];  Service: Cardiovascular;  Laterality: N/A;  .  PERIPHERAL VASCULAR CATHETERIZATION Left 01/03/2016   Procedure: Peripheral Vascular Intervention;  Surgeon: Lorretta Harp, MD;  Location: [place] [facility];  Service: Cardiovascular;  Laterality: Left;  SFA  . PERIPHERAL VASCULAR CATHETERIZATION Right 02/14/2016   Procedure: Peripheral Vascular Atherectomy;  Surgeon: Lorretta Harp, MD;  Location: [place] [facility];  Service: Cardiovascular;  Laterality: Right;  SFA  . POPLITEAL ARTERY STENT  01/03/2016   Contralateral access with a 7 French crossover sheath (second order catheter placement)  . TONSILLECTOMY AND ADENOIDECTOMY    . TUMOR EXCISION Right ~ 2005   cancerous tumor removed from shoulder  . TUMOR EXCISION Right ~ 2000   benign tumor removed from under shoulder  . TUMOR EXCISION Left 06/02/2010   resection of Lt SFA wth interposition of Gore-Tex graft     Current Outpatient Medications  Medication Sig Dispense Refill  . amLODipine (NORVASC) 10 MG tablet Take  10 mg by mouth every evening.     [name] aspirin EC 81 MG tablet Take 81 mg by mouth every evening.     . Cholecalciferol (VITAMIN D3) 2000 units capsule Take 2,000 Units by mouth 2 (two) times daily.    . clopidogrel (PLAVIX) 75 MG tablet Take 1 tablet (75 mg total) by mouth daily with breakfast. 90 tablet 3  . doxylamine, Sleep, (UNISOM) 25 MG tablet Take 25 mg by mouth at bedtime.    [name] glipiZIDE (GLUCOTROL XL) 5 MG 24 hr tablet Take 5 mg by mouth 2 (two) times daily.    . isosorbide mononitrate (IMDUR) 30 MG 24 hr tablet Take 30 mg by mouth daily.    [name] lovastatin (MEVACOR) 40 MG tablet Take 40 mg by mouth at bedtime.      . metFORMIN (GLUCOPHAGE) 1000 MG tablet Take 1,000 mg by mouth 2 (two) times daily with a meal.      . metoprolol tartrate (LOPRESSOR) 100 MG tablet Take 1 tablet (100 mg total) by mouth 2 (two) times daily. 180 tablet 3  . Multiple Vitamin (MULTIVITAMIN) capsule Take 1 capsule by mouth daily.      . nitroGLYCERIN (NITROSTAT) 0.4 MG SL tablet Place 1  tablet (0.4 mg total) under the tongue every 5 (five) minutes as needed for chest pain. (Patient taking differently: Place 1 tablet (0.4 mg total) under the tongue every 5 (five) minutes as needed for chest pain.) 25 tablet 3  . potassium chloride SA (K-DUR,KLOR-CON) 20 MEQ tablet Take 1 tablet (20 mEq total) by mouth daily. (Patient taking differently: Take 20 mEq by mouth every evening. ) 15 tablet 0  . rOPINIRole (REQUIP) 1 MG tablet Take 1 mg by mouth at bedtime.    . sodium chloride (OCEAN) 0.65 % SOLN nasal spray Place 1 spray into both nostrils as needed for congestion.     . tamsulosin (FLOMAX) 0.4 MG CAPS capsule Take 0.4 mg by mouth daily.     . hydrALAZINE (APRESOLINE) 25 MG tablet Take 1 tablet (25 mg total) by mouth 3 (three) times daily. 90 tablet 3  . hydrochlorothiazide (HYDRODIURIL) 25 MG tablet Take 1 tablet (25 mg total) by mouth daily. 90 tablet 3  . pantoprazole (PROTONIX) 40 MG tablet Take 1 tablet (40 mg total) by mouth daily. 30 tablet 11   No current facility-administered medications for this visit.     Allergies:   Peanut-containing drug products; Ace inhibitors; Clonidine hcl; Coreg [carvedilol]; Diltiazem hcl; and Mobic [meloxicam]    Social History:  The patient  reports that he quit smoking about 46 years ago. His smoking use included pipe. He quit after 10.00 years of use. He has never used smokeless tobacco. He reports that he does not drink alcohol or use drugs.   Family History:  The patient's family history includes Coronary artery disease in his mother; Diabetes in his father and sister; Heart attack in his father and mother; Heart disease in his father, mother, and sister; Hyperlipidemia in his father; Hypertension in his mother and sister.    ROS: All other systems are reviewed and negative. Unless otherwise mentioned in H&P    PHYSICAL EXAM: VS:  BP (!) 155/83   Pulse 96   Ht 5\' 8"  (1.727 m)   Wt 189 lb (85.7 kg)   BMI 28.74 kg/m  , BMI Body mass  index is 28.74 kg/m. GEN: Well nourished, well developed, in no acute distress  HEENT: normal  Neck: no JVD,  carotid bruits, or masses Cardiac: RRR; no murmurs, rubs, or gallops,no edema  Respiratory:  clear to auscultation bilaterally, normal work of breathing GI: soft, nontender, nondistended, + BS MS: no deformity or atrophy  Skin: warm and dry, no rash Neuro:  Strength and sensation are intact Psych: euthymic mood, full affect   EKG: Not completed during this office visit.  Recent Labs: 03/28/2017: TSH 0.964 04/13/2017: Hemoglobin 13.3; Platelets 146 04/14/2017: BUN 12; Creatinine, Ser 0.99; Potassium 3.8; Sodium 145    Lipid Panel No results found for: CHOL, TRIG, HDL, CHOLHDL, VLDL, LDLCALC, LDLDIRECT    Wt Readings from Last 3 Encounters:  08/07/17 189 lb (85.7 kg)  05/11/17 183 lb 8 oz (83.2 kg)  04/23/17 180 lb 9.6 oz (81.9 kg)      Other studies Reviewed: Cardiac Cath 04/12/2017     Ost RCA to Prox RCA lesion is 100% stenosed.  Ost LAD to Prox LAD lesion is 100% stenosed.  Mid LAD lesion is 50% stenosed.  RPDA lesion is 80% stenosed.  Previously placed Origin to Prox Graft stent (unknown type) is widely patent.  Previously placed Dist Graft stent (unknown type) is widely patent.  Previously placed Origin to Prox Graft stent (unknown type) is widely patent.  Prox Graft lesion is 50% stenosed.  Prox Graft to Mid Graft lesion between [facility] and 2nd Mrg is 100% stenosed.  Origin to Prox Graft lesion before Ost 1st Mrg is 90% stenosed.  Ost Cx lesion is 60% stenosed.  Mid LAD to Dist LAD lesion is 75% stenosed.  Ost 2nd Mrg to 2nd Mrg lesion is 80% stenosed.  A stent was successfully placed.  Post intervention, there is a 0% residual stenosis.  The left ventricular ejection fraction is 50-55% by visual estimate.  The left ventricular systolic function is normal.  LV end diastolic pressure is normal.     ASSESSMENT AND PLAN:  1.  CAD:  History of LIMA to LAD, SVG to diagonal branch, SVG to ramus and OM branch and vein graft to PDA.  DES to the Ramus of the OM. The patient continues to have occasional intermittent chest discomfort on exertion but very brief lasting seconds and does not keep him from his activities.  He continues on isosorbide mononitrate 30 mg daily, metoprolol had been increased to 100 mg twice daily and he will receive a new prescription.  I will not plan any new cardiac testing as he is essentially asymptomatic.  He is unable to tolerate Ranexa and therefore this has been removed from his medication list.  2.  Hypertension: The patient may be having medication induced cough from ARB.  He does not tolerate ACE inhibitors.  I will therefore stop Hyzaar, begin hydralazine 25 mg 3 times daily and have a separate Rx for HCTZ 25 mg daily.  He will continue all his other medications as directed.  The patient will be seen again in 1 month to evaluate his response to medication along with a BMET.  3.  Hypercholesterolemia: He will continue lovastatin 40 mg daily.  I do not see recent lipids and LFTs.  Wife to order these on next office visit.   4. PAD: Stents to the iliac artery and rotational atherectomy/DES to the right mid SFA. Continue Plavix, ASA.   5. GERD: Continues PPI with pantoprazole 40 mg daily.   Current medicines are reviewed at length with the patient today.    Labs/ tests ordered today include: BMET   Phill Myron. Purcell Nails  DNP, ANP, AACC   08/07/2017 11:36 AM    [facility] [address] [place], [place] [address] Phone: [phone]; Fax: [phone]

## 2017-08-07 ENCOUNTER — Encounter: Payer: Self-pay | Admitting: Adult Health

## 2017-08-07 ENCOUNTER — Ambulatory Visit: Payer: Medicare HMO | Admitting: Adult Health

## 2017-08-07 VITALS — BP 155/83 | HR 96 | Ht 68.0 in | Wt 189.0 lb

## 2017-08-07 DIAGNOSIS — I739 Peripheral vascular disease, unspecified: Secondary | ICD-10-CM

## 2017-08-07 DIAGNOSIS — I2510 Atherosclerotic heart disease of native coronary artery without angina pectoris: Secondary | ICD-10-CM | POA: Diagnosis not present

## 2017-08-07 DIAGNOSIS — E7800 Pure hypercholesterolemia, unspecified: Secondary | ICD-10-CM

## 2017-08-07 DIAGNOSIS — I10 Essential (primary) hypertension: Secondary | ICD-10-CM | POA: Diagnosis not present

## 2017-08-07 DIAGNOSIS — Z79899 Other long term (current) drug therapy: Secondary | ICD-10-CM

## 2017-08-07 MED ORDER — HYDROCHLOROTHIAZIDE 25 MG PO TABS: 25.0000 mg | ORAL_TABLET | Freq: Every day | ORAL | 3 refills | Status: DC

## 2017-08-07 MED ORDER — PANTOPRAZOLE SODIUM 40 MG PO TBEC: 40.0000 mg | DELAYED_RELEASE_TABLET | Freq: Every day | ORAL | 11 refills | Status: DC

## 2017-08-07 MED ORDER — METOPROLOL TARTRATE 100 MG PO TABS: 100.0000 mg | ORAL_TABLET | Freq: Two times a day (BID) | ORAL | 3 refills | Status: DC

## 2017-08-07 MED ORDER — HYDRALAZINE HCL 25 MG PO TABS: 25.0000 mg | ORAL_TABLET | Freq: Three times a day (TID) | ORAL | 3 refills | Status: DC

## 2017-08-07 NOTE — Patient Instructions (Signed)
Medication Instructions:  STOP LOSARTAN  START HYDRALAZINE 25MG  THREE TIMES DAILY  START HYDROCHLOROTHIAZIDE 25MG  DAILY  If you need a refill on your cardiac medications before your next appointment, please call your pharmacy.  Labwork: BMET 1 WEEK BEFORE FOLLOW UP IN 1 MONTH HERE IN OUR OFFICE AT [facility]  Take the provided lab slips with you to the lab for your blood draw.   You will NOT need to fast   Follow-Up: Your physician wants you to follow-up in: [place] (NURSE PRACTIONIER), [facility]    Thank you for choosing [facility] at [employer]!!

## 2017-08-24 NOTE — Telephone Encounter (Signed)
This was taken care of via paper form and given to [facility].

## 2017-08-30 LAB — BASIC METABOLIC PANEL
BUN/Creatinine Ratio: 19 (ref 10–24)
BUN: 19 mg/dL (ref 8–27)
CALCIUM: 9.6 mg/dL (ref 8.6–10.2)
CHLORIDE: 104 mmol/L (ref 96–106)
CO2: 22 mmol/L (ref 20–29)
CREATININE: 1.01 mg/dL (ref 0.76–1.27)
GFR calc non Af Amer: 71 mL/min/{1.73_m2} (ref >59)
GFR, EST AFRICAN AMERICAN: 82 mL/min/{1.73_m2} (ref >59)
Glucose: 121 mg/dL — ABNORMAL HIGH (ref 65–99)
Potassium: 4.1 mmol/L (ref 3.5–5.2)
Sodium: 142 mmol/L (ref 134–144)

## 2017-09-03 NOTE — Progress Notes (Signed)
Cardiology Office Note   Date:  09/03/2017   ID:  Robert Burns, DOB 1940/10/07, MRN [id number]  PCP:  [name], MD  Cardiologist: Dr.Berry   History of Present Illness:  Ongoing assessment and management of coronary artery disease, history of CABG 06/2002 (LIMA to LAD, vein graft to diagonal branch, SVG to ramus, and OM, and vein graft to PDA).  The patient also has peripheral vascular disease and is status post left SFA/PTA and stenting in July 2004.  The patient has a pacemaker in situ which was placed for sick sinus syndrome in 11 of 2008, chronic leg pain with space-occupying lesion which was removed by Dr. Kellie Simmering with placement of interposition 6 mm Gore-Tex graft; with other history to include OSA on CPAP.  Repeat cardiac catheterization was completed in October 2016 which revealed a high-grade segmental proximal right SFA disease and a subtotally occluded diagonal branch S VG which did require stenting of his RCA/SVG on 02/01/2015, with a stage diagonal branch SVG intervention on 03/01/2015.  Also, he required stenting of his left internal iliac artery and staged intervention of a 90% calcified segment in the mid right SFA on 11/25/2015 with a diamondback orbital rotational atherectomy and DES of a mid calcified SFA in the setting of recurrent leg pain.  On last office visit on 08/07/2017, the patient had stopped taking his Ranexa as was causing him to have headaches, after stopping this he felt much better.  He also complained of a frequent "strangling cough", was seen by his PCP who placed him on a PPI for GERD which was thought to be contributing to this, he was seen by ENT who did not find any abnormalities, and was concerned that losartan.  On last office visit I did not plan any new cardiac testing.  His losartan was stopped and I begin hydralazine 25 mg 3 times daily with a separate Rx for HCTZ 25 mg daily (as he was on Hyzaar).  He was to follow-up in 1 month, and have a BMET.  He  will also need to have fasting lipids and LFTs on this visit.  He comes today with a poison ivy eruption after working in his yard. He continues to have some chest pain with exertion which goes away with resting. The pain is not severe. He is tolerating hydralazine without dizziness or fatigue.   Past Medical History:  Diagnosis Date  . Arthritis    "all over" (11/25/2015)  . CAD (coronary artery disease)    a. s/p CABG  06/2002; b. 02/01/15 PCI: DES to prox SVG to PDA, staged PCI of SVG to Diag in 02/2015; c. 04/2017 Cath/PCI: LM nl, LAD 100ost, 67m, 75d, LCX 60ost, OM2 80, RCA 100ost, RPDA 80, LIMA->LAD ok, VG->D1 patent stent, VG->RPDA patent stent, 50p, VG->OM1->OM2 90p (3.0x24 Synergy DES), 100 between OM1->OM2 (med rx).  . Cancer ([place])    Right Shoulder, Left Leg- BCC, SCC, AND MELANOMA  . Hyperlipidemia   . Hypertension   . Leg pain   . OSA on CPAP   . PAD (peripheral artery disease) ([place])    a. 10/2002 L SFA PTA/BMS; b. 8/17 LE Angio: LEIA 90 (9x40 self exp stent), LSFA short segment prox occlusion (staged PTA/stenting 01/03/2016), patent mid stent, RSFA 69m (staged PTA/DEB 02/14/2016).  . Presence of permanent cardiac pacemaker   . SSS (sick sinus syndrome) ([place])    a. s/p PPM in 2007 with gen change 04/2015 - Medtronic Adapta ADDRL1, ser # [id number].  . Type  II diabetes mellitus ([place])   . Urgency of urination     Past Surgical History:  Procedure Laterality Date  . CARDIAC CATHETERIZATION N/A 02/01/2015   Procedure: Left Heart Cath and Coronary Angiography;  Surgeon: Lorretta Harp, MD;  Location: [facility];  Service: Cardiovascular;  Laterality: N/A;  . CARDIAC CATHETERIZATION N/A 02/01/2015   Procedure: Coronary Stent Intervention;  Surgeon: Lorretta Harp, MD;  Location: [facility];  Service: Cardiovascular;  Laterality: N/A;  . CARDIAC CATHETERIZATION  06/2002   "just before bypass OR"  . CARDIAC CATHETERIZATION N/A 03/01/2015   Procedure: Coronary Stent  Intervention;  Surgeon: Lorretta Harp, MD;  Location: [facility];  Service: Cardiovascular;  Laterality: N/A;  . CORONARY ANGIOPLASTY    . CORONARY ARTERY BYPASS GRAFT  06/2002   x5, LIMA-LAD;VG- Diag; seq VG- ramus & OM branch; VG-PDA  . CORONARY STENT INTERVENTION N/A 04/12/2017   Procedure: CORONARY STENT INTERVENTION;  Surgeon: Lorretta Harp, MD;  Location: [facility];  Service: Cardiovascular;  Laterality: N/A;  . EP IMPLANTABLE DEVICE N/A 04/23/2015   Procedure: PPM Generator Changeout;  Surgeon: Sanda Klein, MD;  Location: [facility];  Service: Cardiovascular;  Laterality: N/A;  . FEMORAL ARTERY STENT Left ~ 2014   "taken out of my leg; couldn' catorgorize what kind so the put it under all 3"; cataroziepitheloid hemanioendotheliomau  . FOOT FRACTURE SURGERY Left 1973  . FRACTURE SURGERY    . INSERT / REPLACE / REMOVE PACEMAKER  10/13/05   right side, medtronic Adapta  . KNEE HARDWARE REMOVAL Right 1950's   "3-4 months after the insertion"  . KNEE SURGERY Right 1950's   "broke my lower leg; had to put pin in my knee to keep lower leg in place til it healed"  . LEFT HEART CATH AND CORS/GRAFTS ANGIOGRAPHY N/A 04/12/2017   Procedure: LEFT HEART CATH AND CORS/GRAFTS ANGIOGRAPHY;  Surgeon: Lorretta Harp, MD;  Location: [facility];  Service: Cardiovascular;  Laterality: N/A;  . PERIPHERAL VASCULAR CATHETERIZATION N/A 11/25/2015   Procedure: Lower Extremity Angiography;  Surgeon: Lorretta Harp, MD;  Location: [facility];  Service: Cardiovascular;  Laterality: N/A;  . PERIPHERAL VASCULAR CATHETERIZATION Left 11/25/2015   Procedure: Peripheral Vascular Intervention;  Surgeon: Lorretta Harp, MD;  Location: [facility];  Service: Cardiovascular;  Laterality: Left;  external iliac  . PERIPHERAL VASCULAR CATHETERIZATION N/A 01/03/2016   Procedure: Lower Extremity Angiography;  Surgeon: Lorretta Harp, MD;  Location: [facility];   Service: Cardiovascular;  Laterality: N/A;  . PERIPHERAL VASCULAR CATHETERIZATION Left 01/03/2016   Procedure: Peripheral Vascular Intervention;  Surgeon: Lorretta Harp, MD;  Location: [facility];  Service: Cardiovascular;  Laterality: Left;  SFA  . PERIPHERAL VASCULAR CATHETERIZATION Right 02/14/2016   Procedure: Peripheral Vascular Atherectomy;  Surgeon: Lorretta Harp, MD;  Location: [facility];  Service: Cardiovascular;  Laterality: Right;  SFA  . POPLITEAL ARTERY STENT  01/03/2016   Contralateral access with a 7 French crossover sheath (second order catheter placement)  . TONSILLECTOMY AND ADENOIDECTOMY    . TUMOR EXCISION Right ~ 2005   cancerous tumor removed from shoulder  . TUMOR EXCISION Right ~ 2000   benign tumor removed from under shoulder  . TUMOR EXCISION Left 06/02/2010   resection of Lt SFA wth interposition of Gore-Tex graft     Current Outpatient Medications  Medication Sig Dispense Refill  .  amLODipine (NORVASC) 10 MG tablet Take 10 mg by mouth every evening.     [name] aspirin EC 81 MG tablet Take 81 mg by mouth every evening.     . Cholecalciferol (VITAMIN D3) 2000 units capsule Take 2,000 Units by mouth 2 (two) times daily.    . clopidogrel (PLAVIX) 75 MG tablet Take 1 tablet (75 mg total) by mouth daily with breakfast. 90 tablet 3  . doxylamine, Sleep, (UNISOM) 25 MG tablet Take 25 mg by mouth at bedtime.    [name] glipiZIDE (GLUCOTROL XL) 5 MG 24 hr tablet Take 5 mg by mouth 2 (two) times daily.    . hydrALAZINE (APRESOLINE) 25 MG tablet Take 1 tablet (25 mg total) by mouth 3 (three) times daily. 90 tablet 3  . hydrochlorothiazide (HYDRODIURIL) 25 MG tablet Take 1 tablet (25 mg total) by mouth daily. 90 tablet 3  . isosorbide mononitrate (IMDUR) 30 MG 24 hr tablet Take 30 mg by mouth daily.    [name] lovastatin (MEVACOR) 40 MG tablet Take 40 mg by mouth at bedtime.      . metFORMIN (GLUCOPHAGE) 1000 MG tablet Take 1,000 mg by mouth 2 (two) times daily with a  meal.      . metoprolol tartrate (LOPRESSOR) 100 MG tablet Take 1 tablet (100 mg total) by mouth 2 (two) times daily. 180 tablet 3  . Multiple Vitamin (MULTIVITAMIN) capsule Take 1 capsule by mouth daily.      . nitroGLYCERIN (NITROSTAT) 0.4 MG SL tablet Place 1 tablet (0.4 mg total) under the tongue every 5 (five) minutes as needed for chest pain. (Patient taking differently: Place 1 tablet (0.4 mg total) under the tongue every 5 (five) minutes as needed for chest pain.) 25 tablet 3  . pantoprazole (PROTONIX) 40 MG tablet Take 1 tablet (40 mg total) by mouth daily. 30 tablet 11  . potassium chloride SA (K-DUR,KLOR-CON) 20 MEQ tablet Take 1 tablet (20 mEq total) by mouth daily. (Patient taking differently: Take 20 mEq by mouth every evening. ) 15 tablet 0  . rOPINIRole (REQUIP) 1 MG tablet Take 1 mg by mouth at bedtime.    . sodium chloride (OCEAN) 0.65 % SOLN nasal spray Place 1 spray into both nostrils as needed for congestion.     . tamsulosin (FLOMAX) 0.4 MG CAPS capsule Take 0.4 mg by mouth daily.      No current facility-administered medications for this visit.     Allergies:   Peanut-containing drug products; Ace inhibitors; Clonidine hcl; Coreg [carvedilol]; Diltiazem hcl; and Mobic [meloxicam]    Social History:  The patient  reports that he quit smoking about 46 years ago. His smoking use included pipe. He quit after 10.00 years of use. He has never used smokeless tobacco. He reports that he does not drink alcohol or use drugs.   Family History:  The patient's family history includes Coronary artery disease in his mother; Diabetes in his father and sister; Heart attack in his father and mother; Heart disease in his father, mother, and sister; Hyperlipidemia in his father; Hypertension in his mother and sister.    ROS: All other systems are reviewed and negative. Unless otherwise mentioned in H&P    PHYSICAL EXAM: VS:  There were no vitals taken for this visit. , BMI There is no  height or weight on file to calculate BMI. GEN: Well nourished, well developed, in no acute distress  HEENT: Right eye erythematous with papilar rash under eye orbit. Neck: no JVD, carotid  bruits, or masses Cardiac: RRR; no murmurs, rubs, or gallops,no edema  Respiratory:  clear to auscultation bilaterally, normal work of breathing GI: soft, nontender, nondistended, + BS MS: no deformity or atrophy  Skin: warm and dry, no rash, papilar rash on forearms, bilateral pretibial area.  Neuro:  Strength and sensation are intact Psych: euthymic mood, full affect   EKG:  Not completed this office visit.   Recent Labs: 03/28/2017: TSH 0.964 04/13/2017: Hemoglobin 13.3; Platelets 146 08/29/2017: BUN 19; Creatinine, Ser 1.01; Potassium 4.1; Sodium 142    Lipid Panel No results found for: CHOL, TRIG, HDL, CHOLHDL, VLDL, LDLCALC, LDLDIRECT    Wt Readings from Last 3 Encounters:  08/07/17 189 lb (85.7 kg)  05/11/17 183 lb 8 oz (83.2 kg)  04/23/17 180 lb 9.6 oz (81.9 kg)      Other studies Reviewed: Cardiac Cath 04/12/2017     Ost RCA to Prox RCA lesion is 100% stenosed.  Ost LAD to Prox LAD lesion is 100% stenosed.  Mid LAD lesion is 50% stenosed.  RPDA lesion is 80% stenosed.  Previously placed Origin to Prox Graft stent (unknown type) is widely patent.  Previously placed Dist Graft stent (unknown type) is widely patent.  Previously placed Origin to Prox Graft stent (unknown type) is widely patent.  Prox Graft lesion is 50% stenosed.  Prox Graft to Mid Graft lesion between [facility] and 2nd Mrg is 100% stenosed.  Origin to Prox Graft lesion before Ost 1st Mrg is 90% stenosed.  Ost Cx lesion is 60% stenosed.  Mid LAD to Dist LAD lesion is 75% stenosed.  Ost 2nd Mrg to 2nd Mrg lesion is 80% stenosed.  A stent was successfully placed.  Post intervention, there is a 0% residual stenosis.  The left ventricular ejection fraction is 50-55% by visual estimate.  The left  ventricular systolic function is normal.  LV end diastolic pressure is normal.   ASSESSMENT AND PLAN:  1.  Coronary Artery Disease:  Multivessel disease with most recent intervention to the ostial 2nd Mrg. He continues to have stable angina associated with exertion. He does not tolerate Ranxea due to headache's. I will increase the isosorbide mononitrate to 45 mg daily. I have explained with heart disease he may have some mild chest pain with exertion, but severe pain will need to be addressed. Continue DAPT with Plavix and ASA.  Continue secondary prevention with statin.  2. Poison Ivy Eruption: Will given him steroid dose pack, and benadryl for symptomatic relief.   3. Hypertension: Much better controlled on addition of hydralazine, HCTZ, and continue amlodipine for BP and anginal control.    Current medicines are reviewed at length with the patient today.    Labs/ tests ordered today include: None  Phill Myron. West Pugh, ANP, AACC   09/03/2017 10:20 AM    [facility] [address] [place], [place] [address] Phone: [phone]; Fax: [phone]

## 2017-09-04 ENCOUNTER — Ambulatory Visit: Payer: Medicare HMO | Admitting: Adult Health

## 2017-09-04 ENCOUNTER — Encounter: Payer: Self-pay | Admitting: Adult Health

## 2017-09-04 VITALS — BP 130/74 | HR 68 | Ht 68.0 in | Wt 191.2 lb

## 2017-09-04 DIAGNOSIS — E7800 Pure hypercholesterolemia, unspecified: Secondary | ICD-10-CM | POA: Diagnosis not present

## 2017-09-04 DIAGNOSIS — I10 Essential (primary) hypertension: Secondary | ICD-10-CM

## 2017-09-04 DIAGNOSIS — L237 Allergic contact dermatitis due to plants, except food: Secondary | ICD-10-CM

## 2017-09-04 DIAGNOSIS — I2510 Atherosclerotic heart disease of native coronary artery without angina pectoris: Secondary | ICD-10-CM | POA: Diagnosis not present

## 2017-09-04 MED ORDER — DIPHENHYDRAMINE HCL 25 MG PO TABS: 25.0000 mg | ORAL_TABLET | Freq: Four times a day (QID) | ORAL | 0 refills | Status: DC | PRN

## 2017-09-04 MED ORDER — METHYLPREDNISOLONE 4 MG PO TBPK: ORAL_TABLET | ORAL | 0 refills | Status: DC

## 2017-09-04 MED ORDER — ISOSORBIDE MONONITRATE ER 30 MG PO TB24: 45.0000 mg | ORAL_TABLET | Freq: Every day | ORAL | 1 refills | Status: DC

## 2017-09-04 NOTE — Patient Instructions (Signed)
Medication Instructions:  INCREASE ISOSORBIDE 45MG (1.5 TAB) DAILY  TAKE PREDNISONE PACK-UNTIL THEY ARE GONE  MAKE SURE TO GET BENADRYL 25MG  AND TAKE EVERY 6 HOURS AS-NEEDED FOR ITCHING  If you need a refill on your cardiac medications before your next appointment, please call your pharmacy.  Special Instructions: MAKE SURE TO CALL WITH ANY PROBLEMS  Follow-Up: Your physician wants you to follow-up in: 6 MONTHS WITH DR Gwenlyn Found You should receive a reminder letter in the mail two months in advance. If you do not receive a letter, please call our office [phone] to schedule the 02-2018 follow-up appointment.   Thank you for choosing [facility] at [facility]!!

## 2017-09-14 ENCOUNTER — Other Ambulatory Visit: Payer: Self-pay | Admitting: *Deleted

## 2017-09-14 DIAGNOSIS — I739 Peripheral vascular disease, unspecified: Secondary | ICD-10-CM

## 2017-10-04 ENCOUNTER — Ambulatory Visit (INDEPENDENT_AMBULATORY_CARE_PROVIDER_SITE_OTHER): Payer: Medicare HMO | Admitting: *Deleted

## 2017-10-04 ENCOUNTER — Telehealth: Payer: Self-pay

## 2017-10-04 DIAGNOSIS — I495 Sick sinus syndrome: Secondary | ICD-10-CM | POA: Diagnosis not present

## 2017-10-04 NOTE — Telephone Encounter (Signed)
LMOVM reminding pt to send remote transmission.   

## 2017-10-05 IMAGING — NM NM MISC PROCEDURE
6 series · 36 of 36 positions shown · non-contrast
Comparison: none

[Series 1: wbr rest · 6.40mm/px · 6 of 64 frames shown]
[frame 6/64]
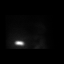
[frame 16/64]
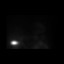
[frame 27/64]
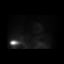
[frame 38/64]
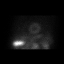
[frame 48/64]
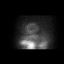
[frame 59/64]
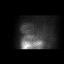

[Series 1: wbr_r-proj_st wbr rest · 6.40mm/px · 6 of 64 frames shown]
[frame 6/64]
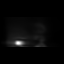
[frame 16/64]
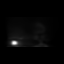
[frame 27/64]
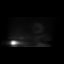
[frame 38/64]
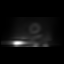
[frame 48/64]
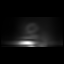
[frame 59/64]
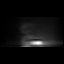

[Series 2: wbr_s-proj_st wbr stress-gsp · 6.40mm/px · 6 of 512 frames shown]
[frame 43/512]
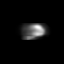
[frame 128/512]
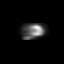
[frame 214/512]
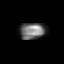
[frame 299/512]
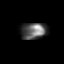
[frame 384/512]
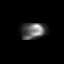
[frame 470/512]
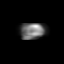

[Series 2: wbr stress-gsp · 6.40mm/px · 6 of 512 frames shown]
[frame 43/512]
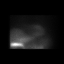
[frame 128/512]
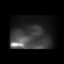
[frame 214/512]
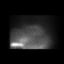
[frame 299/512]
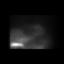
[frame 384/512]
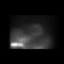
[frame 470/512]
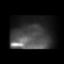

[Series 3: wbr_s-proj_st wbr stress-sum-em · 6.40mm/px · 6 of 64 frames shown]
[frame 6/64]
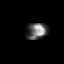
[frame 16/64]
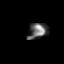
[frame 27/64]
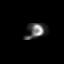
[frame 38/64]
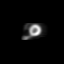
[frame 48/64]
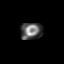
[frame 59/64]
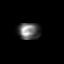

[Series 3: wbr stress-sum-em · 6.40mm/px · 6 of 64 frames shown]
[frame 6/64]
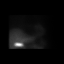
[frame 16/64]
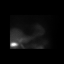
[frame 27/64]
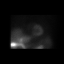
[frame 38/64]
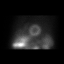
[frame 48/64]
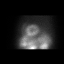
[frame 59/64]
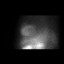

[36 of 36 positions shown; findings below may reference images not displayed]

Canned report from images found in remote index.

Refer to host system for actual result text.

## 2017-10-09 ENCOUNTER — Encounter: Payer: Self-pay | Admitting: Cardiology

## 2017-10-09 NOTE — Progress Notes (Signed)
Remote pacemaker transmission.   

## 2017-10-16 LAB — CUP PACEART REMOTE DEVICE CHECK
Battery Remaining Longevity: 129 mo
Brady Statistic AP VS Percent: 99 %
Brady Statistic AS VP Percent: 0 %
Brady Statistic AS VS Percent: 1 %
Date Time Interrogation Session: 20190628213821
Implantable Lead Implant Date: 20070706
Implantable Lead Location: 753859
Implantable Lead Location: 753860
Implantable Lead Model: 5092
Lead Channel Impedance Value: 667 Ohm
Lead Channel Pacing Threshold Amplitude: 1.125 V
Lead Channel Pacing Threshold Amplitude: 1.75 V
Lead Channel Pacing Threshold Pulse Width: 0.4 ms
Lead Channel Pacing Threshold Pulse Width: 0.4 ms
Lead Channel Setting Pacing Amplitude: 2.25 V
Lead Channel Setting Pacing Amplitude: 3.5 V
MDC IDC LEAD IMPLANT DT: 20070706
MDC IDC MSMT BATTERY IMPEDANCE: 161 Ohm
MDC IDC MSMT BATTERY VOLTAGE: 2.8 V
MDC IDC MSMT LEADCHNL RA IMPEDANCE VALUE: 461 Ohm
MDC IDC PG IMPLANT DT: 20170113
MDC IDC SET LEADCHNL RV PACING PULSEWIDTH: 0.4 ms
MDC IDC SET LEADCHNL RV SENSING SENSITIVITY: 5.6 mV
MDC IDC STAT BRADY AP VP PERCENT: 1 %

## 2017-10-19 NOTE — Progress Notes (Signed)
Patient needs to come in for discussion regarding initiation of a novel oral anticoagulant.

## 2018-01-08 ENCOUNTER — Ambulatory Visit (INDEPENDENT_AMBULATORY_CARE_PROVIDER_SITE_OTHER): Payer: Medicare HMO | Admitting: *Deleted

## 2018-01-08 ENCOUNTER — Telehealth: Payer: Self-pay | Admitting: Cardiology

## 2018-01-08 DIAGNOSIS — I495 Sick sinus syndrome: Secondary | ICD-10-CM

## 2018-01-08 NOTE — Telephone Encounter (Signed)
Spoke with pt and reminded pt of remote transmission that is due today. Pt verbalized understanding.   

## 2018-01-09 NOTE — Progress Notes (Signed)
Remote pacemaker transmission.   

## 2018-01-14 LAB — CUP PACEART REMOTE DEVICE CHECK
Battery Remaining Longevity: 115 mo
Brady Statistic AS VP Percent: 0 %
Date Time Interrogation Session: 20191001231000
Implantable Lead Implant Date: 20070706
Implantable Lead Implant Date: 20070706
Implantable Lead Location: 753860
Implantable Pulse Generator Implant Date: 20170113
Lead Channel Pacing Threshold Amplitude: 1.75 V
Lead Channel Pacing Threshold Pulse Width: 0.4 ms
Lead Channel Setting Pacing Amplitude: 2.5 V
Lead Channel Setting Sensing Sensitivity: 2.8 mV
MDC IDC LEAD LOCATION: 753859
MDC IDC MSMT BATTERY IMPEDANCE: 185 Ohm
MDC IDC MSMT BATTERY VOLTAGE: 2.79 V
MDC IDC MSMT LEADCHNL RA IMPEDANCE VALUE: 453 Ohm
MDC IDC MSMT LEADCHNL RA PACING THRESHOLD AMPLITUDE: 1.25 V
MDC IDC MSMT LEADCHNL RA PACING THRESHOLD PULSEWIDTH: 0.4 ms
MDC IDC MSMT LEADCHNL RV IMPEDANCE VALUE: 662 Ohm
MDC IDC SET LEADCHNL RV PACING AMPLITUDE: 3.5 V
MDC IDC SET LEADCHNL RV PACING PULSEWIDTH: 0.4 ms
MDC IDC STAT BRADY AP VP PERCENT: 1 %
MDC IDC STAT BRADY AP VS PERCENT: 98 %
MDC IDC STAT BRADY AS VS PERCENT: 1 %

## 2018-02-03 NOTE — H&P (View-Only) (Signed)
Cardiology Office Note   Date:  02/03/2018   ID:  DHEERAJ HAIL, DOB 09-26-1940, MRN [id number]  PCP:  [name], MD  Cardiologist:  Dr. Gwenlyn Found No chief complaint on file.    History of Present Illness: Robert Burns is a 77 y.o. male who presents for ongoing assessment and management of coronary artery disease, history of CABG 06/2002 (LIMA to LAD, vein graft to diagonal branch, SVG to ramus, and OM, and vein graft to PDA).  The patient also has peripheral vascular disease and is status post left SFA/PTA and stenting in July 2004.  The patient has a pacemaker in situ which was placed for sick sinus syndrome in 11 of 2008, chronic leg pain with space-occupying lesion which was removed by Dr. Kellie Simmering with placement of interposition 6 mm Gore-Tex graft; with other history to include OSA on CPAP.  Repeat  aortogram was completed in October 2016 which revealed a high-grade segmental proximal right SFA disease and a subtotally occluded diagonal branch S VG which did require stenting of his RCA/SVG on 02/01/2015, with a stage diagonal branch SVG intervention on 03/01/2015.    Also, he required stenting of his left internal iliac artery and staged intervention of a 90% calcified segment in the mid right SFA on 11/25/2015 with a diamondback orbital rotational atherectomy and DES of a mid calcified SFA in the setting of recurrent leg pain.  Cardiac cath 04/12/2017 revealed ostial second marginal to 2nd marginal lesion 80% stenosed and a stent was successfully placed .  Pacemaker interrogation on 01/08/2018 demonstrated episodes of PAF since June of 2019.    He is having recurrent chest discomfort sometimes as rest, and sometimes at rest. He describes it as chest tightness. He also has to have prostate surgery due to prostate enlargement. He has had dysuria and has been followed by Dr. Stann Mainland at [facility] who plans to do a TURP or prostectomy He requires cardiac clearance.   Past Medical History:    Diagnosis Date  . Arthritis    "all over" (11/25/2015)  . CAD (coronary artery disease)    a. s/p CABG  06/2002; b. 02/01/15 PCI: DES to prox SVG to PDA, staged PCI of SVG to Diag in 02/2015; c. 04/2017 Cath/PCI: LM nl, LAD 100ost, 84m, 75d, LCX 60ost, OM2 80, RCA 100ost, RPDA 80, LIMA->LAD ok, VG->D1 patent stent, VG->RPDA patent stent, 50p, VG->OM1->OM2 90p (3.0x24 Synergy DES), 100 between OM1->OM2 (med rx).  . Cancer ([place])    Right Shoulder, Left Leg- BCC, SCC, AND MELANOMA  . Hyperlipidemia   . Hypertension   . Leg pain   . OSA on CPAP   . PAD (peripheral artery disease) ([place])    a. 10/2002 L SFA PTA/BMS; b. 8/17 LE Angio: LEIA 90 (9x40 self exp stent), LSFA short segment prox occlusion (staged PTA/stenting 01/03/2016), patent mid stent, RSFA 51m (staged PTA/DEB 02/14/2016).  . Presence of permanent cardiac pacemaker   . SSS (sick sinus syndrome) ([place])    a. s/p PPM in 2007 with gen change 04/2015 - Medtronic Adapta ADDRL1, ser # [id number].  . Type II diabetes mellitus ([place])   . Urgency of urination     Past Surgical History:  Procedure Laterality Date  . CARDIAC CATHETERIZATION N/A 02/01/2015   Procedure: Left Heart Cath and Coronary Angiography;  Surgeon: Lorretta Harp, MD;  Location: [place] [facility];  Service: Cardiovascular;  Laterality: N/A;  . CARDIAC CATHETERIZATION N/A 02/01/2015   Procedure: Coronary Stent Intervention;  Surgeon:  Lorretta Harp, MD;  Location: [place] [facility];  Service: Cardiovascular;  Laterality: N/A;  . CARDIAC CATHETERIZATION  06/2002   "just before bypass OR"  . CARDIAC CATHETERIZATION N/A 03/01/2015   Procedure: Coronary Stent Intervention;  Surgeon: Lorretta Harp, MD;  Location: [place] [facility];  Service: Cardiovascular;  Laterality: N/A;  . CORONARY ANGIOPLASTY    . CORONARY ARTERY BYPASS GRAFT  06/2002   x5, LIMA-LAD;VG- Diag; seq VG- ramus & OM branch; VG-PDA  . CORONARY STENT INTERVENTION N/A 04/12/2017   Procedure: CORONARY STENT  INTERVENTION;  Surgeon: Lorretta Harp, MD;  Location: [place] [facility];  Service: Cardiovascular;  Laterality: N/A;  . EP IMPLANTABLE DEVICE N/A 04/23/2015   Procedure: PPM Generator Changeout;  Surgeon: Sanda Klein, MD;  Location: [place] [facility];  Service: Cardiovascular;  Laterality: N/A;  . FEMORAL ARTERY STENT Left ~ 2014   "taken out of my leg; couldn' catorgorize what kind so the put it under all 3"; cataroziepitheloid hemanioendotheliomau  . FOOT FRACTURE SURGERY Left 1973  . FRACTURE SURGERY    . INSERT / REPLACE / REMOVE PACEMAKER  10/13/05   right side, medtronic Adapta  . KNEE HARDWARE REMOVAL Right 1950's   "3-4 months after the insertion"  . KNEE SURGERY Right 1950's   "broke my lower leg; had to put pin in my knee to keep lower leg in place til it healed"  . LEFT HEART CATH AND CORS/GRAFTS ANGIOGRAPHY N/A 04/12/2017   Procedure: LEFT HEART CATH AND CORS/GRAFTS ANGIOGRAPHY;  Surgeon: Lorretta Harp, MD;  Location: [place] [facility];  Service: Cardiovascular;  Laterality: N/A;  . PERIPHERAL VASCULAR CATHETERIZATION N/A 11/25/2015   Procedure: Lower Extremity Angiography;  Surgeon: Lorretta Harp, MD;  Location: [place] [facility];  Service: Cardiovascular;  Laterality: N/A;  . PERIPHERAL VASCULAR CATHETERIZATION Left 11/25/2015   Procedure: Peripheral Vascular Intervention;  Surgeon: Lorretta Harp, MD;  Location: [place] [facility];  Service: Cardiovascular;  Laterality: Left;  external iliac  . PERIPHERAL VASCULAR CATHETERIZATION N/A 01/03/2016   Procedure: Lower Extremity Angiography;  Surgeon: Lorretta Harp, MD;  Location: [place] [facility];  Service: Cardiovascular;  Laterality: N/A;  . PERIPHERAL VASCULAR CATHETERIZATION Left 01/03/2016   Procedure: Peripheral Vascular Intervention;  Surgeon: Lorretta Harp, MD;  Location: [place] [facility];  Service: Cardiovascular;  Laterality: Left;  SFA  . PERIPHERAL VASCULAR CATHETERIZATION Right 02/14/2016    Procedure: Peripheral Vascular Atherectomy;  Surgeon: Lorretta Harp, MD;  Location: [place] [facility];  Service: Cardiovascular;  Laterality: Right;  SFA  . POPLITEAL ARTERY STENT  01/03/2016   Contralateral access with a 7 French crossover sheath (second order catheter placement)  . TONSILLECTOMY AND ADENOIDECTOMY    . TUMOR EXCISION Right ~ 2005   cancerous tumor removed from shoulder  . TUMOR EXCISION Right ~ 2000   benign tumor removed from under shoulder  . TUMOR EXCISION Left 06/02/2010   resection of Lt SFA wth interposition of Gore-Tex graft     Current Outpatient Medications  Medication Sig Dispense Refill  . amLODipine (NORVASC) 10 MG tablet Take 10 mg by mouth every evening.     [name] aspirin EC 81 MG tablet Take 81 mg by mouth every evening.     . Cholecalciferol (VITAMIN D3) 2000 units capsule Take 2,000 Units by mouth 2 (two) times daily.    . clopidogrel (PLAVIX) 75 MG tablet Take 1 tablet (75 mg total) by mouth daily  with breakfast. 90 tablet 3  . diphenhydrAMINE (BENADRYL ALLERGY) 25 MG tablet Take 1 tablet (25 mg total) by mouth every 6 (six) hours as needed. 30 tablet 0  . doxylamine, Sleep, (UNISOM) 25 MG tablet Take 25 mg by mouth at bedtime.    [name] glipiZIDE (GLUCOTROL XL) 5 MG 24 hr tablet Take 5 mg by mouth 2 (two) times daily.    . hydrALAZINE (APRESOLINE) 25 MG tablet Take 1 tablet (25 mg total) by mouth 3 (three) times daily. 90 tablet 3  . hydrochlorothiazide (HYDRODIURIL) 25 MG tablet Take 1 tablet (25 mg total) by mouth daily. 90 tablet 3  . isosorbide mononitrate (IMDUR) 30 MG 24 hr tablet Take 1.5 tablets (45 mg total) by mouth daily. 135 tablet 1  . lovastatin (MEVACOR) 40 MG tablet Take 40 mg by mouth at bedtime.      . metFORMIN (GLUCOPHAGE) 1000 MG tablet Take 1,000 mg by mouth 2 (two) times daily with a meal.      . methylPREDNISolone (MEDROL DOSEPAK) 4 MG TBPK tablet As directed included 21 tablet 0  . metoprolol tartrate (LOPRESSOR) 100 MG tablet Take  1 tablet (100 mg total) by mouth 2 (two) times daily. 180 tablet 3  . Multiple Vitamin (MULTIVITAMIN) capsule Take 1 capsule by mouth daily.      . nitroGLYCERIN (NITROSTAT) 0.4 MG SL tablet Place 1 tablet (0.4 mg total) under the tongue every 5 (five) minutes as needed for chest pain. (Patient taking differently: Place 1 tablet (0.4 mg total) under the tongue every 5 (five) minutes as needed for chest pain.) 25 tablet 3  . pantoprazole (PROTONIX) 40 MG tablet Take 1 tablet (40 mg total) by mouth daily. 30 tablet 11  . potassium chloride SA (K-DUR,KLOR-CON) 20 MEQ tablet Take 1 tablet (20 mEq total) by mouth daily. (Patient taking differently: Take 20 mEq by mouth every evening. ) 15 tablet 0  . rOPINIRole (REQUIP) 1 MG tablet Take 1 mg by mouth at bedtime.    . sodium chloride (OCEAN) 0.65 % SOLN nasal spray Place 1 spray into both nostrils as needed for congestion.     . tamsulosin (FLOMAX) 0.4 MG CAPS capsule Take 0.4 mg by mouth daily.      No current facility-administered medications for this visit.     Allergies:   Peanut-containing drug products; Ace inhibitors; Clonidine hcl; Coreg [carvedilol]; Diltiazem hcl; and Mobic [meloxicam]    Social History:  The patient  reports that he quit smoking about 46 years ago. His smoking use included pipe. He quit after 10.00 years of use. He has never used smokeless tobacco. He reports that he does not drink alcohol or use drugs.   Family History:  The patient's family history includes Coronary artery disease in his mother; Diabetes in his father and sister; Heart attack in his father and mother; Heart disease in his father, mother, and sister; Hyperlipidemia in his father; Hypertension in his mother and sister.    ROS: All other systems are reviewed and negative. Unless otherwise mentioned in H&P    PHYSICAL EXAM: VS:  There were no vitals taken for this visit. , BMI There is no height or weight on file to calculate BMI. GEN: Well nourished,  well developed, in no acute distress HEENT: normal Neck: no JVD, carotid bruits, or masses Cardiac: RRR; no murmurs, rubs, or gallops,no edema  Respiratory:  Clear to auscultation bilaterally, normal work of breathing GI: soft, nontender, nondistended, + BS MS: no deformity or  atrophy Skin: warm and dry, no rash Neuro:  Strength and sensation are intact Psych: euthymic mood, full affect   EKG: Atrial paced rhythm evidence of septal infarct, prolonged AV conduction. Rate of 78 bpm.   Recent Labs: 03/28/2017: TSH 0.964 04/13/2017: Hemoglobin 13.3; Platelets 146 08/29/2017: BUN 19; Creatinine, Ser 1.01; Potassium 4.1; Sodium 142    Lipid Panel No results found for: CHOL, TRIG, HDL, CHOLHDL, VLDL, LDLCALC, LDLDIRECT    Wt Readings from Last 3 Encounters:  09/04/17 191 lb 3.2 oz (86.7 kg)  08/07/17 189 lb (85.7 kg)  05/11/17 183 lb 8 oz (83.2 kg)      Other studies Reviewed: Cardiac Cath 04/12/2017   Ost RCA to Prox RCA lesion is 100% stenosed.  Ost LAD to Prox LAD lesion is 100% stenosed.  Mid LAD lesion is 50% stenosed.  RPDA lesion is 80% stenosed.  Previously placed Origin to Prox Graft stent (unknown type) is widely patent.  Previously placed Dist Graft stent (unknown type) is widely patent.  Previously placed Origin to Prox Graft stent (unknown type) is widely patent.  Prox Graft lesion is 50% stenosed.  Prox Graft to Mid Graft lesion between [facility] and 2nd Mrg is 100% stenosed.  Origin to Prox Graft lesion before Ost 1st Mrg is 90% stenosed.  Ost Cx lesion is 60% stenosed.  Mid LAD to Dist LAD lesion is 75% stenosed.  Ost 2nd Mrg to 2nd Mrg lesion is 80% stenosed.  A stent was successfully placed.  Post intervention, there is a 0% residual stenosis.  The left ventricular ejection fraction is 50-55% by visual estimate.  The left ventricular systolic function is normal.  LV end diastolic pressure is normal.    ASSESSMENT AND PLAN:  1. Recurrent  chest pain: Known history of CAD with multiple interventions. I have spoken to Dr. Gwenlyn Found by phone to discuss his symptoms. I was concerned whether to repeat a Lexiscan stress test vs repeating cardiac cath. After discussion with Dr. Gwenlyn Found, it is recommended that he have cardiac cath for definitive evaluation for progression of CAD. This is scheduled for Feb 06, 2018 with Dr. Claiborne Billings        The patient understands that risks include but are not limited to stroke (1 in 1000), death (1 in 19), kidney failure [usually temporary] (1 in 500), bleeding (1 in 200), allergic reaction [possibly serious] (1 in 200), and agrees to proceed.    2. CAD: Hx of CABG in 2004 with LIMA to LAD, SVG to diagonal, SVG to RAmus, and OM and vein graft to PDA>  He had repeat cath in 2016 requiring stenting of the SVG/RCA and staged diagonal branch SVG intervention. He has had stent placement to ostial second marginal lesion in 04/2017. He remains on isosorbide, clopidogrel, ASA and metoprolol.   3. PAF: Noted on recent PPM interrogation 01/08/2018. He was to have discussion about adding DOAC today. Will hold off on this until after cath is completed in case more intensive intervention is necessary or surgery. This can be determined post cath.   4. PPM in situ: Followed by Dr. Sallyanne Kuster.   5. Pre-Operative Evaluation: He is not yet cleared to have prostate surgery until after cardiac cath. Further discussion on post hospitalization follow up.  6. PAD: Stenting of left internal iliac artery, and staged intervention of 90% calcified segment of the mid right SFA, diamond back rotational atherectomy and DES to SFA.   Current medicines are reviewed at length with the patient  today.    Labs/ tests ordered today include: Cardiac cath scheduled for Wednesday Feb 06, 2018, as first case. Pre-cath labs and orders are written.   Phill Myron. West Pugh, ANP, AACC   02/03/2018 3:09 PM    [place] [address] Office [phone] Fax [phone]

## 2018-02-03 NOTE — Progress Notes (Addendum)
Cardiology Office Note   Date:  02/03/2018   ID:  Robert Burns, DOB 29-Apr-1940, MRN [id number]  PCP:  [name], MD  Cardiologist:  Dr. Gwenlyn Found No chief complaint on file.    History of Present Illness: Robert Burns is a 77 y.o. male who presents for ongoing assessment and management of coronary artery disease, history of CABG 06/2002 (LIMA to LAD, vein graft to diagonal branch, SVG to ramus, and OM, and vein graft to PDA).  The patient also has peripheral vascular disease and is status post left SFA/PTA and stenting in July 2004.  The patient has a pacemaker in situ which was placed for sick sinus syndrome in 11 of 2008, chronic leg pain with space-occupying lesion which was removed by Dr. Kellie Simmering with placement of interposition 6 mm Gore-Tex graft; with other history to include OSA on CPAP.  Repeat  aortogram was completed in October 2016 which revealed a high-grade segmental proximal right SFA disease and a subtotally occluded diagonal branch S VG which did require stenting of his RCA/SVG on 02/01/2015, with a stage diagonal branch SVG intervention on 03/01/2015.    Also, he required stenting of his left internal iliac artery and staged intervention of a 90% calcified segment in the mid right SFA on 11/25/2015 with a diamondback orbital rotational atherectomy and DES of a mid calcified SFA in the setting of recurrent leg pain.  Cardiac cath 04/12/2017 revealed ostial second marginal to 2nd marginal lesion 80% stenosed and a stent was successfully placed .  Pacemaker interrogation on 01/08/2018 demonstrated episodes of PAF since June of 2019.    He is having recurrent chest discomfort sometimes as rest, and sometimes at rest. He describes it as chest tightness. He also has to have prostate surgery due to prostate enlargement. He has had dysuria and has been followed by Dr. Stann Mainland at [facility] who plans to do a TURP or prostectomy He requires cardiac clearance.   Past Medical History:    Diagnosis Date  . Arthritis    "all over" (11/25/2015)  . CAD (coronary artery disease)    a. s/p CABG  06/2002; b. 02/01/15 PCI: DES to prox SVG to PDA, staged PCI of SVG to Diag in 02/2015; c. 04/2017 Cath/PCI: LM nl, LAD 100ost, 38m, 75d, LCX 60ost, OM2 80, RCA 100ost, RPDA 80, LIMA->LAD ok, VG->D1 patent stent, VG->RPDA patent stent, 50p, VG->OM1->OM2 90p (3.0x24 Synergy DES), 100 between OM1->OM2 (med rx).  . Cancer ([place])    Right Shoulder, Left Leg- BCC, SCC, AND MELANOMA  . Hyperlipidemia   . Hypertension   . Leg pain   . OSA on CPAP   . PAD (peripheral artery disease) ([place])    a. 10/2002 L SFA PTA/BMS; b. 8/17 LE Angio: LEIA 90 (9x40 self exp stent), LSFA short segment prox occlusion (staged PTA/stenting 01/03/2016), patent mid stent, RSFA 46m (staged PTA/DEB 02/14/2016).  . Presence of permanent cardiac pacemaker   . SSS (sick sinus syndrome) ([place])    a. s/p PPM in 2007 with gen change 04/2015 - Medtronic Adapta ADDRL1, ser # [id number].  . Type II diabetes mellitus ([place])   . Urgency of urination     Past Surgical History:  Procedure Laterality Date  . CARDIAC CATHETERIZATION N/A 02/01/2015   Procedure: Left Heart Cath and Coronary Angiography;  Surgeon: Lorretta Harp, MD;  Location: [place] [facility];  Service: Cardiovascular;  Laterality: N/A;  . CARDIAC CATHETERIZATION N/A 02/01/2015   Procedure: Coronary Stent Intervention;  Surgeon:  Lorretta Harp, MD;  Location: [place] [facility];  Service: Cardiovascular;  Laterality: N/A;  . CARDIAC CATHETERIZATION  06/2002   "just before bypass OR"  . CARDIAC CATHETERIZATION N/A 03/01/2015   Procedure: Coronary Stent Intervention;  Surgeon: Lorretta Harp, MD;  Location: [place] [facility];  Service: Cardiovascular;  Laterality: N/A;  . CORONARY ANGIOPLASTY    . CORONARY ARTERY BYPASS GRAFT  06/2002   x5, LIMA-LAD;VG- Diag; seq VG- ramus & OM branch; VG-PDA  . CORONARY STENT INTERVENTION N/A 04/12/2017   Procedure: CORONARY STENT  INTERVENTION;  Surgeon: Lorretta Harp, MD;  Location: [place] [facility];  Service: Cardiovascular;  Laterality: N/A;  . EP IMPLANTABLE DEVICE N/A 04/23/2015   Procedure: PPM Generator Changeout;  Surgeon: Sanda Klein, MD;  Location: [place] [facility];  Service: Cardiovascular;  Laterality: N/A;  . FEMORAL ARTERY STENT Left ~ 2014   "taken out of my leg; couldn' catorgorize what kind so the put it under all 3"; cataroziepitheloid hemanioendotheliomau  . FOOT FRACTURE SURGERY Left 1973  . FRACTURE SURGERY    . INSERT / REPLACE / REMOVE PACEMAKER  10/13/05   right side, medtronic Adapta  . KNEE HARDWARE REMOVAL Right 1950's   "3-4 months after the insertion"  . KNEE SURGERY Right 1950's   "broke my lower leg; had to put pin in my knee to keep lower leg in place til it healed"  . LEFT HEART CATH AND CORS/GRAFTS ANGIOGRAPHY N/A 04/12/2017   Procedure: LEFT HEART CATH AND CORS/GRAFTS ANGIOGRAPHY;  Surgeon: Lorretta Harp, MD;  Location: [place] [facility];  Service: Cardiovascular;  Laterality: N/A;  . PERIPHERAL VASCULAR CATHETERIZATION N/A 11/25/2015   Procedure: Lower Extremity Angiography;  Surgeon: Lorretta Harp, MD;  Location: [place] [facility];  Service: Cardiovascular;  Laterality: N/A;  . PERIPHERAL VASCULAR CATHETERIZATION Left 11/25/2015   Procedure: Peripheral Vascular Intervention;  Surgeon: Lorretta Harp, MD;  Location: [place] [facility];  Service: Cardiovascular;  Laterality: Left;  external iliac  . PERIPHERAL VASCULAR CATHETERIZATION N/A 01/03/2016   Procedure: Lower Extremity Angiography;  Surgeon: Lorretta Harp, MD;  Location: [place] [facility];  Service: Cardiovascular;  Laterality: N/A;  . PERIPHERAL VASCULAR CATHETERIZATION Left 01/03/2016   Procedure: Peripheral Vascular Intervention;  Surgeon: Lorretta Harp, MD;  Location: [place] [facility];  Service: Cardiovascular;  Laterality: Left;  SFA  . PERIPHERAL VASCULAR CATHETERIZATION Right 02/14/2016    Procedure: Peripheral Vascular Atherectomy;  Surgeon: Lorretta Harp, MD;  Location: [place] [facility];  Service: Cardiovascular;  Laterality: Right;  SFA  . POPLITEAL ARTERY STENT  01/03/2016   Contralateral access with a 7 French crossover sheath (second order catheter placement)  . TONSILLECTOMY AND ADENOIDECTOMY    . TUMOR EXCISION Right ~ 2005   cancerous tumor removed from shoulder  . TUMOR EXCISION Right ~ 2000   benign tumor removed from under shoulder  . TUMOR EXCISION Left 06/02/2010   resection of Lt SFA wth interposition of Gore-Tex graft     Current Outpatient Medications  Medication Sig Dispense Refill  . amLODipine (NORVASC) 10 MG tablet Take 10 mg by mouth every evening.     [name] aspirin EC 81 MG tablet Take 81 mg by mouth every evening.     . Cholecalciferol (VITAMIN D3) 2000 units capsule Take 2,000 Units by mouth 2 (two) times daily.    . clopidogrel (PLAVIX) 75 MG tablet Take 1 tablet (75 mg total) by mouth daily  with breakfast. 90 tablet 3  . diphenhydrAMINE (BENADRYL ALLERGY) 25 MG tablet Take 1 tablet (25 mg total) by mouth every 6 (six) hours as needed. 30 tablet 0  . doxylamine, Sleep, (UNISOM) 25 MG tablet Take 25 mg by mouth at bedtime.    [name] glipiZIDE (GLUCOTROL XL) 5 MG 24 hr tablet Take 5 mg by mouth 2 (two) times daily.    . hydrALAZINE (APRESOLINE) 25 MG tablet Take 1 tablet (25 mg total) by mouth 3 (three) times daily. 90 tablet 3  . hydrochlorothiazide (HYDRODIURIL) 25 MG tablet Take 1 tablet (25 mg total) by mouth daily. 90 tablet 3  . isosorbide mononitrate (IMDUR) 30 MG 24 hr tablet Take 1.5 tablets (45 mg total) by mouth daily. 135 tablet 1  . lovastatin (MEVACOR) 40 MG tablet Take 40 mg by mouth at bedtime.      . metFORMIN (GLUCOPHAGE) 1000 MG tablet Take 1,000 mg by mouth 2 (two) times daily with a meal.      . methylPREDNISolone (MEDROL DOSEPAK) 4 MG TBPK tablet As directed included 21 tablet 0  . metoprolol tartrate (LOPRESSOR) 100 MG tablet Take  1 tablet (100 mg total) by mouth 2 (two) times daily. 180 tablet 3  . Multiple Vitamin (MULTIVITAMIN) capsule Take 1 capsule by mouth daily.      . nitroGLYCERIN (NITROSTAT) 0.4 MG SL tablet Place 1 tablet (0.4 mg total) under the tongue every 5 (five) minutes as needed for chest pain. (Patient taking differently: Place 1 tablet (0.4 mg total) under the tongue every 5 (five) minutes as needed for chest pain.) 25 tablet 3  . pantoprazole (PROTONIX) 40 MG tablet Take 1 tablet (40 mg total) by mouth daily. 30 tablet 11  . potassium chloride SA (K-DUR,KLOR-CON) 20 MEQ tablet Take 1 tablet (20 mEq total) by mouth daily. (Patient taking differently: Take 20 mEq by mouth every evening. ) 15 tablet 0  . rOPINIRole (REQUIP) 1 MG tablet Take 1 mg by mouth at bedtime.    . sodium chloride (OCEAN) 0.65 % SOLN nasal spray Place 1 spray into both nostrils as needed for congestion.     . tamsulosin (FLOMAX) 0.4 MG CAPS capsule Take 0.4 mg by mouth daily.      No current facility-administered medications for this visit.     Allergies:   Peanut-containing drug products; Ace inhibitors; Clonidine hcl; Coreg [carvedilol]; Diltiazem hcl; and Mobic [meloxicam]    Social History:  The patient  reports that he quit smoking about 46 years ago. His smoking use included pipe. He quit after 10.00 years of use. He has never used smokeless tobacco. He reports that he does not drink alcohol or use drugs.   Family History:  The patient's family history includes Coronary artery disease in his mother; Diabetes in his father and sister; Heart attack in his father and mother; Heart disease in his father, mother, and sister; Hyperlipidemia in his father; Hypertension in his mother and sister.    ROS: All other systems are reviewed and negative. Unless otherwise mentioned in H&P    PHYSICAL EXAM: VS:  There were no vitals taken for this visit. , BMI There is no height or weight on file to calculate BMI. GEN: Well nourished,  well developed, in no acute distress HEENT: normal Neck: no JVD, carotid bruits, or masses Cardiac: RRR; no murmurs, rubs, or gallops,no edema  Respiratory:  Clear to auscultation bilaterally, normal work of breathing GI: soft, nontender, nondistended, + BS MS: no deformity or  atrophy Skin: warm and dry, no rash Neuro:  Strength and sensation are intact Psych: euthymic mood, full affect   EKG: Atrial paced rhythm evidence of septal infarct, prolonged AV conduction. Rate of 78 bpm.   Recent Labs: 03/28/2017: TSH 0.964 04/13/2017: Hemoglobin 13.3; Platelets 146 08/29/2017: BUN 19; Creatinine, Ser 1.01; Potassium 4.1; Sodium 142    Lipid Panel No results found for: CHOL, TRIG, HDL, CHOLHDL, VLDL, LDLCALC, LDLDIRECT    Wt Readings from Last 3 Encounters:  09/04/17 191 lb 3.2 oz (86.7 kg)  08/07/17 189 lb (85.7 kg)  05/11/17 183 lb 8 oz (83.2 kg)      Other studies Reviewed: Cardiac Cath 04/12/2017   Ost RCA to Prox RCA lesion is 100% stenosed.  Ost LAD to Prox LAD lesion is 100% stenosed.  Mid LAD lesion is 50% stenosed.  RPDA lesion is 80% stenosed.  Previously placed Origin to Prox Graft stent (unknown type) is widely patent.  Previously placed Dist Graft stent (unknown type) is widely patent.  Previously placed Origin to Prox Graft stent (unknown type) is widely patent.  Prox Graft lesion is 50% stenosed.  Prox Graft to Mid Graft lesion between [facility] and 2nd Mrg is 100% stenosed.  Origin to Prox Graft lesion before Ost 1st Mrg is 90% stenosed.  Ost Cx lesion is 60% stenosed.  Mid LAD to Dist LAD lesion is 75% stenosed.  Ost 2nd Mrg to 2nd Mrg lesion is 80% stenosed.  A stent was successfully placed.  Post intervention, there is a 0% residual stenosis.  The left ventricular ejection fraction is 50-55% by visual estimate.  The left ventricular systolic function is normal.  LV end diastolic pressure is normal.    ASSESSMENT AND PLAN:  1. Recurrent  chest pain: Known history of CAD with multiple interventions. I have spoken to Dr. Gwenlyn Found by phone to discuss his symptoms. I was concerned whether to repeat a Lexiscan stress test vs repeating cardiac cath. After discussion with Dr. Gwenlyn Found, it is recommended that he have cardiac cath for definitive evaluation for progression of CAD. This is scheduled for Feb 06, 2018 with Dr. Claiborne Billings        The patient understands that risks include but are not limited to stroke (1 in 1000), death (1 in 59), kidney failure [usually temporary] (1 in 500), bleeding (1 in 200), allergic reaction [possibly serious] (1 in 200), and agrees to proceed.    2. CAD: Hx of CABG in 2004 with LIMA to LAD, SVG to diagonal, SVG to RAmus, and OM and vein graft to PDA>  He had repeat cath in 2016 requiring stenting of the SVG/RCA and staged diagonal branch SVG intervention. He has had stent placement to ostial second marginal lesion in 04/2017. He remains on isosorbide, clopidogrel, ASA and metoprolol.   3. PAF: Noted on recent PPM interrogation 01/08/2018. He was to have discussion about adding DOAC today. Will hold off on this until after cath is completed in case more intensive intervention is necessary or surgery. This can be determined post cath.   4. PPM in situ: Followed by Dr. Sallyanne Kuster.   5. Pre-Operative Evaluation: He is not yet cleared to have prostate surgery until after cardiac cath. Further discussion on post hospitalization follow up.  6. PAD: Stenting of left internal iliac artery, and staged intervention of 90% calcified segment of the mid right SFA, diamond back rotational atherectomy and DES to SFA.   Current medicines are reviewed at length with the patient  today.    Labs/ tests ordered today include: Cardiac cath scheduled for Wednesday Feb 06, 2018, as first case. Pre-cath labs and orders are written.   Phill Myron. West Pugh, ANP, AACC   02/03/2018 3:09 PM    [place] [address] Office [phone] Fax [phone]

## 2018-02-04 ENCOUNTER — Encounter: Payer: Self-pay | Admitting: Cardiovascular Disease

## 2018-02-04 ENCOUNTER — Ambulatory Visit: Payer: Medicare HMO | Admitting: Adult Health

## 2018-02-04 ENCOUNTER — Encounter: Payer: Self-pay | Admitting: Adult Health

## 2018-02-04 VITALS — BP 140/78 | HR 78 | Ht 68.0 in | Wt 186.2 lb

## 2018-02-04 DIAGNOSIS — I480 Paroxysmal atrial fibrillation: Secondary | ICD-10-CM

## 2018-02-04 DIAGNOSIS — I25119 Atherosclerotic heart disease of native coronary artery with unspecified angina pectoris: Secondary | ICD-10-CM | POA: Diagnosis not present

## 2018-02-04 DIAGNOSIS — I2510 Atherosclerotic heart disease of native coronary artery without angina pectoris: Secondary | ICD-10-CM | POA: Diagnosis not present

## 2018-02-04 DIAGNOSIS — I739 Peripheral vascular disease, unspecified: Secondary | ICD-10-CM

## 2018-02-04 DIAGNOSIS — Z9861 Coronary angioplasty status: Secondary | ICD-10-CM

## 2018-02-04 DIAGNOSIS — Z01811 Encounter for preprocedural respiratory examination: Secondary | ICD-10-CM

## 2018-02-04 NOTE — Patient Instructions (Addendum)
    Robert Burns Dept: [id number] Loc: [phone]  Robert Burns  02/04/2018  You are scheduled for a Cardiac Catheterization on Wednesday, October 30 with Dr. Shelva Majestic.  1. Please arrive at the [facility] (Main Entrance A) at [facility]: [address] [place], [place] [address] at 6:30 AM (This time is two hours before your procedure to ensure your preparation). Free valet parking service is available.   Special note: Every effort is made to have your procedure done on time. Please understand that emergencies sometimes delay scheduled procedures.  2. Diet: Do not eat solid foods after midnight.  The patient may have clear liquids until 5am upon the day of the procedure.  3. Labs: You will need to have blood drawn on Tuesday, October 29 at [place], [place]  Open: [place] (Lunch 12:30 - 1:30)   Phone: [phone]. You do not need to be fasting.  4. Medication instructions in preparation for your procedure:   Contrast Allergy: No  Do not take Diabetes Med Glucophage (Metformin) on the day of the procedure and HOLD 48 HOURS AFTER THE PROCEDURE.AND DO NOT TAKE GLIPIZIDE THE MORNING OF THE CATH.  On the morning of your procedure, take your Aspirin and Plavix/Clopidogrel and any morning medicines NOT listed above.  You may use sips of water.  5. Plan for one night stay--bring personal belongings. 6. Bring a current list of your medications and current insurance cards. 7. You MUST have a responsible person to drive you home. 8. Someone MUST be with you the first 24 hours after you arrive home or your discharge will be delayed. 9. Please wear clothes that are easy to get on and off and wear slip-on shoes.  Thank you for allowing [place] to care for you!   -- [place] [facility]

## 2018-02-05 ENCOUNTER — Telehealth: Payer: Self-pay | Admitting: *Deleted

## 2018-02-05 ENCOUNTER — Other Ambulatory Visit: Payer: Self-pay

## 2018-02-05 DIAGNOSIS — I2510 Atherosclerotic heart disease of native coronary artery without angina pectoris: Secondary | ICD-10-CM

## 2018-02-05 DIAGNOSIS — Z01812 Encounter for preprocedural laboratory examination: Secondary | ICD-10-CM

## 2018-02-05 LAB — BASIC METABOLIC PANEL
BUN / CREAT RATIO: 23 (ref 10–24)
BUN: 23 mg/dL (ref 8–27)
CO2: 27 mmol/L (ref 20–29)
CREATININE: 1.01 mg/dL (ref 0.76–1.27)
Calcium: 9.5 mg/dL (ref 8.6–10.2)
Chloride: 103 mmol/L (ref 96–106)
GFR calc Af Amer: 81 mL/min/{1.73_m2} (ref >59)
GFR, EST NON AFRICAN AMERICAN: 70 mL/min/{1.73_m2} (ref >59)
Glucose: 251 mg/dL — ABNORMAL HIGH (ref 65–99)
POTASSIUM: 4.7 mmol/L (ref 3.5–5.2)
SODIUM: 139 mmol/L (ref 134–144)

## 2018-02-05 LAB — CBC
HEMATOCRIT: 39.3 % (ref 37.5–51.0)
HEMOGLOBIN: 13 g/dL (ref 13.0–17.7)
MCH: 27.6 pg (ref 26.6–33.0)
MCHC: 33.1 g/dL (ref 31.5–35.7)
MCV: 83 fL (ref 79–97)
Platelets: 164 10*3/uL (ref 150–450)
RBC: 4.71 x10E6/uL (ref 4.14–5.80)
RDW: 14.9 % (ref 12.3–15.4)
WBC: 10.4 10*3/uL (ref 3.4–10.8)

## 2018-02-05 NOTE — Telephone Encounter (Signed)
Pt given cath instructions... He verbalized understanding and repeated them back to me.

## 2018-02-05 NOTE — Telephone Encounter (Addendum)
Pt contacted pre-catheterization scheduled at [facility] for: Wednesday October 30,2019 8:30 AM Verified arrival time and place: [place] Entrance A at: 6:30 AM  No solid food after midnight prior to cath, clear liquids until 5 AM day of procedure. Contrast allergy: no  Hold: Metformin-day of procedure and 48 hours post procedure. Glipizide-AM of procedure. HCTZ-AM of procedure. KCl-AM of procedure   Except hold medications AM meds can be  taken pre-cath with sip of water including: ASA 81 mg Clopidogrel 75 mg  Confirm patient has responsible person to drive home post procedure and for 24 hours after you arrive home: yes

## 2018-02-05 NOTE — Telephone Encounter (Signed)
Follow up   Patient is returning calls about orders.

## 2018-02-06 ENCOUNTER — Inpatient Hospital Stay (HOSPITAL_COMMUNITY)
Admission: AD | Admit: 2018-02-06 | Discharge: 2018-02-09 | DRG: 247 | Disposition: A | Discharge status: Home / Self Care | Payer: Medicare HMO | Attending: Cardiovascular Disease | Admitting: Cardiovascular Disease

## 2018-02-06 ENCOUNTER — Other Ambulatory Visit: Payer: Self-pay

## 2018-02-06 ENCOUNTER — Encounter (HOSPITAL_COMMUNITY)
Admission: AD | Disposition: A | Discharge status: Home / Self Care | Payer: Self-pay | Source: Home / Self Care | Attending: Cardiovascular Disease

## 2018-02-06 ENCOUNTER — Encounter (HOSPITAL_COMMUNITY): Payer: Self-pay | Admitting: General Practice

## 2018-02-06 ENCOUNTER — Encounter (HOSPITAL_COMMUNITY): Payer: Medicare HMO

## 2018-02-06 DIAGNOSIS — E118 Type 2 diabetes mellitus with unspecified complications: Secondary | ICD-10-CM

## 2018-02-06 DIAGNOSIS — G8929 Other chronic pain: Secondary | ICD-10-CM | POA: Diagnosis present

## 2018-02-06 DIAGNOSIS — E876 Hypokalemia: Secondary | ICD-10-CM | POA: Diagnosis present

## 2018-02-06 DIAGNOSIS — Z79899 Other long term (current) drug therapy: Secondary | ICD-10-CM

## 2018-02-06 DIAGNOSIS — Z955 Presence of coronary angioplasty implant and graft: Secondary | ICD-10-CM

## 2018-02-06 DIAGNOSIS — I480 Paroxysmal atrial fibrillation: Secondary | ICD-10-CM | POA: Diagnosis present

## 2018-02-06 DIAGNOSIS — I25110 Atherosclerotic heart disease of native coronary artery with unstable angina pectoris: Secondary | ICD-10-CM

## 2018-02-06 DIAGNOSIS — Z950 Presence of cardiac pacemaker: Secondary | ICD-10-CM

## 2018-02-06 DIAGNOSIS — I255 Ischemic cardiomyopathy: Secondary | ICD-10-CM | POA: Diagnosis present

## 2018-02-06 DIAGNOSIS — Z7982 Long term (current) use of aspirin: Secondary | ICD-10-CM

## 2018-02-06 DIAGNOSIS — Z833 Family history of diabetes mellitus: Secondary | ICD-10-CM

## 2018-02-06 DIAGNOSIS — I70212 Atherosclerosis of native arteries of extremities with intermittent claudication, left leg: Secondary | ICD-10-CM | POA: Diagnosis present

## 2018-02-06 DIAGNOSIS — Z9989 Dependence on other enabling machines and devices: Secondary | ICD-10-CM

## 2018-02-06 DIAGNOSIS — Z9981 Dependence on supplemental oxygen: Secondary | ICD-10-CM

## 2018-02-06 DIAGNOSIS — E1169 Type 2 diabetes mellitus with other specified complication: Secondary | ICD-10-CM | POA: Diagnosis present

## 2018-02-06 DIAGNOSIS — I361 Nonrheumatic tricuspid (valve) insufficiency: Secondary | ICD-10-CM | POA: Diagnosis not present

## 2018-02-06 DIAGNOSIS — Z794 Long term (current) use of insulin: Secondary | ICD-10-CM

## 2018-02-06 DIAGNOSIS — E785 Hyperlipidemia, unspecified: Secondary | ICD-10-CM | POA: Diagnosis present

## 2018-02-06 DIAGNOSIS — I208 Other forms of angina pectoris: Secondary | ICD-10-CM | POA: Diagnosis not present

## 2018-02-06 DIAGNOSIS — M1990 Unspecified osteoarthritis, unspecified site: Secondary | ICD-10-CM | POA: Diagnosis present

## 2018-02-06 DIAGNOSIS — I2582 Chronic total occlusion of coronary artery: Secondary | ICD-10-CM | POA: Diagnosis present

## 2018-02-06 DIAGNOSIS — R3915 Urgency of urination: Secondary | ICD-10-CM | POA: Diagnosis present

## 2018-02-06 DIAGNOSIS — G4733 Obstructive sleep apnea (adult) (pediatric): Secondary | ICD-10-CM | POA: Diagnosis present

## 2018-02-06 DIAGNOSIS — I10 Essential (primary) hypertension: Secondary | ICD-10-CM | POA: Diagnosis present

## 2018-02-06 DIAGNOSIS — I739 Peripheral vascular disease, unspecified: Secondary | ICD-10-CM | POA: Diagnosis present

## 2018-02-06 DIAGNOSIS — E1151 Type 2 diabetes mellitus with diabetic peripheral angiopathy without gangrene: Secondary | ICD-10-CM | POA: Diagnosis present

## 2018-02-06 DIAGNOSIS — I2510 Atherosclerotic heart disease of native coronary artery without angina pectoris: Secondary | ICD-10-CM | POA: Diagnosis present

## 2018-02-06 DIAGNOSIS — I25700 Atherosclerosis of coronary artery bypass graft(s), unspecified, with unstable angina pectoris: Secondary | ICD-10-CM | POA: Diagnosis present

## 2018-02-06 DIAGNOSIS — Z87891 Personal history of nicotine dependence: Secondary | ICD-10-CM

## 2018-02-06 DIAGNOSIS — Z8249 Family history of ischemic heart disease and other diseases of the circulatory system: Secondary | ICD-10-CM

## 2018-02-06 DIAGNOSIS — N401 Enlarged prostate with lower urinary tract symptoms: Secondary | ICD-10-CM | POA: Diagnosis present

## 2018-02-06 DIAGNOSIS — R350 Frequency of micturition: Secondary | ICD-10-CM | POA: Diagnosis present

## 2018-02-06 DIAGNOSIS — I25810 Atherosclerosis of coronary artery bypass graft(s) without angina pectoris: Secondary | ICD-10-CM | POA: Diagnosis present

## 2018-02-06 DIAGNOSIS — Z85820 Personal history of malignant melanoma of skin: Secondary | ICD-10-CM | POA: Diagnosis not present

## 2018-02-06 DIAGNOSIS — Z7902 Long term (current) use of antithrombotics/antiplatelets: Secondary | ICD-10-CM

## 2018-02-06 DIAGNOSIS — I25708 Atherosclerosis of coronary artery bypass graft(s), unspecified, with other forms of angina pectoris: Secondary | ICD-10-CM | POA: Diagnosis not present

## 2018-02-06 DIAGNOSIS — Z8349 Family history of other endocrine, nutritional and metabolic diseases: Secondary | ICD-10-CM

## 2018-02-06 DIAGNOSIS — E119 Type 2 diabetes mellitus without complications: Secondary | ICD-10-CM | POA: Diagnosis not present

## 2018-02-06 HISTORY — PX: CARDIAC CATHETERIZATION: SHX172

## 2018-02-06 HISTORY — PX: LEFT HEART CATH AND CORS/GRAFTS ANGIOGRAPHY: CATH118250

## 2018-02-06 LAB — GLUCOSE, CAPILLARY
GLUCOSE-CAPILLARY: 136 mg/dL — AB (ref 70–99)
Glucose-Capillary: 167 mg/dL — ABNORMAL HIGH (ref 70–99)
Glucose-Capillary: 141 mg/dL — ABNORMAL HIGH (ref 70–99)
Glucose-Capillary: 137 mg/dL — ABNORMAL HIGH (ref 70–99)

## 2018-02-06 LAB — CBC
HEMATOCRIT: 41.4 % (ref 39.0–52.0)
Hemoglobin: 12.6 g/dL — ABNORMAL LOW (ref 13.0–17.0)
MCH: 25.9 pg — AB (ref 26.0–34.0)
MCHC: 30.4 g/dL (ref 30.0–36.0)
MCV: 85.2 fL (ref 80.0–100.0)
Platelets: 183 10*3/uL (ref 150–400)
RBC: 4.86 MIL/uL (ref 4.22–5.81)
RDW: 14.1 % (ref 11.5–15.5)
WBC: 10.3 10*3/uL (ref 4.0–10.5)
nRBC: 0 % (ref 0.0–0.2)

## 2018-02-06 LAB — CREATININE, SERUM
Creatinine, Ser: 0.88 mg/dL (ref 0.61–1.24)
GFR calc Af Amer: >60 mL/min (ref >60)
GFR calc non Af Amer: >60 mL/min (ref >60)

## 2018-02-06 SURGERY — LEFT HEART CATH AND CORS/GRAFTS ANGIOGRAPHY
Anesthesia: LOCAL

## 2018-02-06 MED ORDER — DIAZEPAM 5 MG PO TABS: 5.0000 mg | ORAL_TABLET | ORAL | Status: DC | PRN

## 2018-02-06 MED ORDER — LIDOCAINE HCL (PF) 1 % IJ SOLN
INTRAMUSCULAR | Status: AC
  Filled 2018-02-06: qty 30

## 2018-02-06 MED ORDER — SODIUM CHLORIDE 0.9 % IV SOLN
INTRAVENOUS | Status: DC
  Administered 2018-02-06: 12:00:00 via INTRAVENOUS

## 2018-02-06 MED ORDER — ASPIRIN 81 MG PO CHEW: 81.0000 mg | CHEWABLE_TABLET | Freq: Once | ORAL | Status: DC

## 2018-02-06 MED ORDER — TAMSULOSIN HCL 0.4 MG PO CAPS
0.4000 mg | ORAL_CAPSULE | Freq: Every evening | ORAL | Status: DC
  Administered 2018-02-06 – 2018-02-08 (×3): 0.4 mg via ORAL
  Filled 2018-02-06 (×3): qty 1

## 2018-02-06 MED ORDER — ACETAMINOPHEN 325 MG PO TABS
650.0000 mg | ORAL_TABLET | ORAL | Status: DC | PRN
  Administered 2018-02-07: 650 mg via ORAL
  Filled 2018-02-06: qty 2

## 2018-02-06 MED ORDER — MIDAZOLAM HCL 2 MG/2ML IJ SOLN
INTRAMUSCULAR | Status: AC
  Filled 2018-02-06: qty 2

## 2018-02-06 MED ORDER — SODIUM CHLORIDE 0.9 % WEIGHT BASED INFUSION
3.0000 mL/kg/h | INTRAVENOUS | Status: DC
  Administered 2018-02-06: 3 mL/kg/h via INTRAVENOUS

## 2018-02-06 MED ORDER — SODIUM CHLORIDE 0.9 % IV SOLN: 250.0000 mL | INTRAVENOUS | Status: DC | PRN

## 2018-02-06 MED ORDER — ROPINIROLE HCL 1 MG PO TABS
1.0000 mg | ORAL_TABLET | Freq: Every day | ORAL | Status: DC
  Administered 2018-02-06 – 2018-02-08 (×3): 1 mg via ORAL
  Filled 2018-02-06 (×3): qty 1

## 2018-02-06 MED ORDER — ATORVASTATIN CALCIUM 80 MG PO TABS
80.0000 mg | ORAL_TABLET | Freq: Every day | ORAL | Status: DC
  Administered 2018-02-06 – 2018-02-08 (×3): 80 mg via ORAL
  Filled 2018-02-06 (×3): qty 1

## 2018-02-06 MED ORDER — SODIUM CHLORIDE 0.9 % WEIGHT BASED INFUSION: 1.0000 mL/kg/h | INTRAVENOUS | Status: DC

## 2018-02-06 MED ORDER — GLIPIZIDE 5 MG PO TABS
5.0000 mg | ORAL_TABLET | Freq: Every evening | ORAL | Status: DC
  Administered 2018-02-06 – 2018-02-08 (×3): 5 mg via ORAL
  Filled 2018-02-06 (×3): qty 1

## 2018-02-06 MED ORDER — ASPIRIN 81 MG PO CHEW
81.0000 mg | CHEWABLE_TABLET | Freq: Every day | ORAL | Status: DC
  Administered 2018-02-06 – 2018-02-09 (×3): 81 mg via ORAL
  Filled 2018-02-06 (×4): qty 1

## 2018-02-06 MED ORDER — ONDANSETRON HCL 4 MG/2ML IJ SOLN: 4.0000 mg | Freq: Four times a day (QID) | INTRAMUSCULAR | Status: DC | PRN

## 2018-02-06 MED ORDER — MIDAZOLAM HCL 2 MG/2ML IJ SOLN
INTRAMUSCULAR | Status: DC | PRN
  Administered 2018-02-06: 1 mg via INTRAVENOUS

## 2018-02-06 MED ORDER — HEPARIN (PORCINE) IN NACL 1000-0.9 UT/500ML-% IV SOLN
INTRAVENOUS | Status: DC | PRN
  Administered 2018-02-06 (×2): 500 mL

## 2018-02-06 MED ORDER — ISOSORBIDE MONONITRATE ER 60 MG PO TB24
60.0000 mg | ORAL_TABLET | Freq: Every day | ORAL | Status: DC
  Administered 2018-02-06 – 2018-02-09 (×4): 60 mg via ORAL
  Filled 2018-02-06 (×4): qty 1

## 2018-02-06 MED ORDER — LIDOCAINE HCL (PF) 1 % IJ SOLN
INTRAMUSCULAR | Status: DC | PRN
  Administered 2018-02-06: 15 mg

## 2018-02-06 MED ORDER — METOPROLOL TARTRATE 25 MG PO TABS
100.0000 mg | ORAL_TABLET | Freq: Two times a day (BID) | ORAL | Status: DC
  Administered 2018-02-06 – 2018-02-09 (×6): 100 mg via ORAL
  Filled 2018-02-06 (×2): qty 4
  Filled 2018-02-06 (×3): qty 1
  Filled 2018-02-06: qty 4

## 2018-02-06 MED ORDER — SODIUM CHLORIDE 0.9% FLUSH: 3.0000 mL | INTRAVENOUS | Status: DC | PRN

## 2018-02-06 MED ORDER — SODIUM CHLORIDE 0.9% FLUSH
3.0000 mL | Freq: Two times a day (BID) | INTRAVENOUS | Status: DC
  Administered 2018-02-06 – 2018-02-08 (×4): 3 mL via INTRAVENOUS

## 2018-02-06 MED ORDER — HEPARIN (PORCINE) IN NACL 1000-0.9 UT/500ML-% IV SOLN
INTRAVENOUS | Status: AC
  Filled 2018-02-06: qty 1000

## 2018-02-06 MED ORDER — HEPARIN SODIUM (PORCINE) 5000 UNIT/ML IJ SOLN
5000.0000 [IU] | Freq: Three times a day (TID) | INTRAMUSCULAR | Status: DC
  Administered 2018-02-06 – 2018-02-09 (×7): 5000 [IU] via SUBCUTANEOUS
  Filled 2018-02-06 (×7): qty 1

## 2018-02-06 MED ORDER — AMLODIPINE BESYLATE 10 MG PO TABS
10.0000 mg | ORAL_TABLET | Freq: Every day | ORAL | Status: DC
  Administered 2018-02-07 – 2018-02-09 (×3): 10 mg via ORAL
  Filled 2018-02-06 (×3): qty 1

## 2018-02-06 MED ORDER — SODIUM CHLORIDE 0.9% FLUSH: 3.0000 mL | Freq: Two times a day (BID) | INTRAVENOUS | Status: DC

## 2018-02-06 MED ORDER — HYDRALAZINE HCL 25 MG PO TABS
25.0000 mg | ORAL_TABLET | Freq: Three times a day (TID) | ORAL | Status: DC
  Administered 2018-02-06 – 2018-02-09 (×9): 25 mg via ORAL
  Filled 2018-02-06 (×9): qty 1

## 2018-02-06 MED ORDER — FENTANYL CITRATE (PF) 100 MCG/2ML IJ SOLN
INTRAMUSCULAR | Status: DC | PRN
  Administered 2018-02-06: 25 ug via INTRAVENOUS

## 2018-02-06 MED ORDER — FENTANYL CITRATE (PF) 100 MCG/2ML IJ SOLN
INTRAMUSCULAR | Status: AC
  Filled 2018-02-06: qty 2

## 2018-02-06 MED ORDER — IOHEXOL 350 MG/ML SOLN
INTRAVENOUS | Status: DC | PRN
  Administered 2018-02-06: 135 mg via INTRAVENOUS

## 2018-02-06 MED ORDER — NITROGLYCERIN 0.4 MG SL SUBL: 0.4000 mg | SUBLINGUAL_TABLET | SUBLINGUAL | Status: DC | PRN

## 2018-02-06 SURGICAL SUPPLY — 12 items
CATH INFINITI 5 FR IM (CATHETERS) ×2 IMPLANT
CATH INFINITI 5 FR LCB (CATHETERS) ×2 IMPLANT
CATH INFINITI 5 FR RCB (CATHETERS) ×2 IMPLANT
CATH INFINITI 5FR MULTPACK ANG (CATHETERS) ×2 IMPLANT
KIT HEART LEFT (KITS) ×2 IMPLANT
PACK CARDIAC CATHETERIZATION (CUSTOM PROCEDURE TRAY) ×2 IMPLANT
SHEATH PINNACLE 5F 10CM (SHEATH) ×2 IMPLANT
SYR MEDRAD MARK V 150ML (SYRINGE) ×2 IMPLANT
TRANSDUCER W/STOPCOCK (MISCELLANEOUS) ×2 IMPLANT
TUBING CIL FLEX 10 FLL-RA (TUBING) ×2 IMPLANT
WIRE EMERALD 3MM-J .035X150CM (WIRE) ×2 IMPLANT
WIRE EMERALD 3MM-J .035X260CM (WIRE) ×2 IMPLANT

## 2018-02-06 NOTE — Progress Notes (Signed)
Site area: Right groin a 5 french arterial sheath was removed  Site Prior to Removal:  Level 0  Pressure Applied For 20 MINUTES    Bedrest  Beginning at 1020am  Manual:   Yes.    Patient Status During Pull:  stable  Post Pull Groin Site:  Level 0  Post Pull Instructions Given:  Yes.    Post Pull Pulses Present:  Yes.    Dressing Applied:  Yes.    Comments:  VS remain stable

## 2018-02-06 NOTE — Interval H&P Note (Signed)
Cath Lab Visit (complete for each Cath Lab visit)  Clinical Evaluation Leading to the Procedure:   ACS: No.  Non-ACS:    Anginal Classification: CCS III  Anti-ischemic medical therapy: Maximal Therapy (2 or more classes of medications)  Non-Invasive Test Results: No non-invasive testing performed  Prior CABG: Previous CABG      History and Physical Interval Note:  02/06/2018 8:33 AM  Robert Burns  has presented today for surgery, with the diagnosis of ua  The various methods of treatment have been discussed with the patient and family. After consideration of risks, benefits and other options for treatment, the patient has consented to  Procedure(s): LEFT HEART CATH AND CORS/GRAFTS ANGIOGRAPHY (N/A) as a surgical intervention .  The patient's history has been reviewed, patient examined, no change in status, stable for surgery.  I have reviewed the patient's chart and labs.  Questions were answered to the patient's satisfaction.     Shelva Majestic

## 2018-02-07 ENCOUNTER — Encounter (HOSPITAL_COMMUNITY): Payer: Self-pay | Admitting: Cardiovascular Disease

## 2018-02-07 ENCOUNTER — Inpatient Hospital Stay (HOSPITAL_COMMUNITY): Payer: Medicare HMO

## 2018-02-07 ENCOUNTER — Ambulatory Visit (HOSPITAL_COMMUNITY): Admission: RE | Admit: 2018-02-07 | Payer: Medicare HMO | Source: Ambulatory Visit | Admitting: Cardiovascular Disease

## 2018-02-07 DIAGNOSIS — I361 Nonrheumatic tricuspid (valve) insufficiency: Secondary | ICD-10-CM

## 2018-02-07 DIAGNOSIS — I10 Essential (primary) hypertension: Secondary | ICD-10-CM

## 2018-02-07 DIAGNOSIS — E876 Hypokalemia: Secondary | ICD-10-CM

## 2018-02-07 DIAGNOSIS — E119 Type 2 diabetes mellitus without complications: Secondary | ICD-10-CM

## 2018-02-07 DIAGNOSIS — I25110 Atherosclerotic heart disease of native coronary artery with unstable angina pectoris: Secondary | ICD-10-CM

## 2018-02-07 DIAGNOSIS — I255 Ischemic cardiomyopathy: Secondary | ICD-10-CM

## 2018-02-07 LAB — GLUCOSE, CAPILLARY
GLUCOSE-CAPILLARY: 151 mg/dL — AB (ref 70–99)
Glucose-Capillary: 113 mg/dL — ABNORMAL HIGH (ref 70–99)
Glucose-Capillary: 151 mg/dL — ABNORMAL HIGH (ref 70–99)
Glucose-Capillary: 133 mg/dL — ABNORMAL HIGH (ref 70–99)
Glucose-Capillary: 161 mg/dL — ABNORMAL HIGH (ref 70–99)

## 2018-02-07 LAB — CBC
HEMATOCRIT: 36.9 % — AB (ref 39.0–52.0)
Hemoglobin: 11.7 g/dL — ABNORMAL LOW (ref 13.0–17.0)
MCH: 27 pg (ref 26.0–34.0)
MCHC: 31.7 g/dL (ref 30.0–36.0)
MCV: 85.2 fL (ref 80.0–100.0)
NRBC: 0 % (ref 0.0–0.2)
Platelets: 163 10*3/uL (ref 150–400)
RBC: 4.33 MIL/uL (ref 4.22–5.81)
RDW: 14.1 % (ref 11.5–15.5)
WBC: 10.8 10*3/uL — ABNORMAL HIGH (ref 4.0–10.5)

## 2018-02-07 LAB — BASIC METABOLIC PANEL
Anion gap: 5 (ref 5–15)
BUN: 20 mg/dL (ref 8–23)
CHLORIDE: 110 mmol/L (ref 98–111)
CO2: 23 mmol/L (ref 22–32)
CREATININE: 1.03 mg/dL (ref 0.61–1.24)
Calcium: 8.9 mg/dL (ref 8.9–10.3)
GFR calc Af Amer: >60 mL/min (ref >60)
GFR calc non Af Amer: >60 mL/min (ref >60)
GLUCOSE: 138 mg/dL — AB (ref 70–99)
Potassium: 3.6 mmol/L (ref 3.5–5.1)
Sodium: 138 mmol/L (ref 135–145)

## 2018-02-07 LAB — ECHOCARDIOGRAM COMPLETE
HEIGHTINCHES: 68 in
WEIGHTICAEL: 2950.4 [oz_av]

## 2018-02-07 MED ORDER — POTASSIUM CHLORIDE CRYS ER 20 MEQ PO TBCR
40.0000 meq | EXTENDED_RELEASE_TABLET | Freq: Once | ORAL | Status: AC
  Administered 2018-02-07: 40 meq via ORAL

## 2018-02-07 NOTE — Progress Notes (Signed)
Progress Note  Patient Name: Robert Burns Date of Encounter: 02/07/2018  Primary Cardiologist: Robert Burow, MD   Subjective   No chest pain or dyspnea.   Inpatient Medications    Scheduled Meds: . amLODipine  10 mg Oral Daily  . aspirin  81 mg Oral Daily  . atorvastatin  80 mg Oral q1800  . glipiZIDE  5 mg Oral QPM  . heparin  5,000 Units Subcutaneous Q8H  . hydrALAZINE  25 mg Oral TID  . isosorbide mononitrate  60 mg Oral Daily  . metoprolol tartrate  100 mg Oral BID  . rOPINIRole  1 mg Oral QHS  . sodium chloride flush  3 mL Intravenous Q12H  . tamsulosin  0.4 mg Oral QPM   Continuous Infusions: . sodium chloride 50 mL/hr at 02/06/18 1555  . sodium chloride     PRN Meds: sodium chloride, acetaminophen, diazepam, nitroGLYCERIN, ondansetron (ZOFRAN) IV, sodium chloride flush   Vital Signs    Vitals:   02/06/18 1955 02/06/18 2114 02/07/18 0113 02/07/18 0554  BP: 136/90 (!) 135/51 135/61 (!) 151/68  Pulse: 63 62 61 66  Resp: 18  18 18   Temp: 98.7 F (37.1 C)  (!) 97.5 F (36.4 C)   TempSrc: Oral  Oral   SpO2: 96%  98% 97%  Weight:    83.6 kg  Height:        Intake/Output Summary (Last 24 hours) at 02/07/2018 0755 Last data filed at 02/07/2018 0601 Gross per 24 hour  Intake 657.78 ml  Output 1500 ml  Net -842.22 ml   Filed Weights   02/06/18 0637 02/07/18 0554  Weight: 81.2 kg 83.6 kg    Telemetry    Atrial paced - Personally Reviewed  ECG    No AM EKG - Personally Reviewed  Physical Exam   GEN: No acute distress.   Neck: No JVD Cardiac: RRR, no murmurs, rubs, or gallops.  Respiratory: Clear to auscultation bilaterally. GI: Soft, nontender, non-distended  MS: No edema; No deformity. Neuro:  Nonfocal  Psych: Normal affect   Labs    Chemistry Recent Labs  Lab 02/05/18 1208 02/06/18 1127 02/07/18 0643  NA 139  --  138  K 4.7  --  3.6  CL 103  --  110  CO2 27  --  23  GLUCOSE 251*  --  138*  BUN 23  --  20  CREATININE  1.01 0.88 1.03  CALCIUM 9.5  --  8.9  GFRNONAA 70 >60 >60  GFRAA 81 >60 >60  ANIONGAP  --   --  5     Hematology Recent Labs  Lab 02/05/18 1208 02/06/18 1127 02/07/18 0643  WBC 10.4 10.3 10.8*  RBC 4.71 4.86 4.33  HGB 13.0 12.6* 11.7*  HCT 39.3 41.4 36.9*  MCV 83 85.2 85.2  MCH 27.6 25.9* 27.0  MCHC 33.1 30.4 31.7  RDW 14.9 14.1 14.1  PLT 164 183 163    Cardiac EnzymesNo results for input(s): TROPONINI in the last 168 hours. No results for input(s): TROPIPOC in the last 168 hours.   BNPNo results for input(s): BNP, PROBNP in the last 168 hours.   DDimer No results for input(s): DDIMER in the last 168 hours.   Radiology    No results Burns.  Cardiac Studies   Cardiac cath 02/06/18: Severe multivessel native CAD with 50% distal left main stenosis, 95% ostial LAD, ramus intermediate, and 40% ostial circumflex stenosis.  The LAD is totally occluded proximally.  The ramus intermediate vessel is totally occluded proximally.  The circumflex vessel has diffuse irregularity in its mid segment and has subtotal occlusion of a distal branch of a marginal vessel.  The RCA is totally occluded proximally.  The LIMA graft is widely patent and supplies the mid LAD.  Beyond the graft insertion there is focal 80% mid LAD stenosis and in the distal apical portion of the LAD there is diffuse narrowing of 70-90%.  The sequential vein graft which had supplied the ramus intermediate and circumflex marginal vessel has a stent in its proximal segment.  There is diffuse narrowing of 85% within the stented segment followed by 40 to 50% mid graft stenosis.  The graft anastomoses into a diminutive ramus intermediate branch and then is totally occluded in its sequential limb.  The vein graft supplying the diagonal vessel had a stent placed proximally in January 2019 now has focal 95% in-stent stenosis.  There also is a stent in the distal portion the graft before the diagonal takeoff.  The vein  graft supplying a large PDA and distal RCA has a patent proximal stent and then has 75 to 80% stenosis beyond the stented segment in the mid RCA graft and appears to have 80% diffuse narrowing in the region of the acute margin portion of the graft.  Mild to moderate LV dysfunction with an ejection fraction of 40 to 45% and mid to basal inferior hypocontractility.  LVEDP 18 mm.  RECOMMENDATION: Robert Burns underwent CABG revascularization in 2004 by Robert Burns.  He has diffuse disease in all his vein grafts as well as disease beyond his LIMA insertion.  Repeat surgical consultation will be obtained for assessment of possible redo CABG revascularization surgery.  The patient has been on chronic dual antiplatelet therapy and his Plavix will be held pending surgical evaluation.  If he is not felt to be a candidate for redo CABG surgery, would then consider probable stage PCI with intervention to the focal in-stent restenosis in the vein graft supplying the diagonal vessel, PCI via the LIMA graft to the mid and distal LAD and additional stenting to the graft supplying the large PDA vessel.  I contacted Robert Burns following the procedure made him aware of the catheterization findings.  If he undergoes CABG surgery, I would reinstitute DAPT unstable postoperatively.  If he is not felt to be a surgical candidate, DAPT therapy should be continued indefinitely.  Diagnostic Diagram        Patient Profile     77 y.o. male with history of CAD s/p 5V CABG in 2004, PAD, SSS s/p PPM, OSA, DM, HTN, HLD with unstable angina. Cath on 02/06/18 per Robert Burns with severe disease in all grafts, severe native vessel disease.   Assessment & Plan    1. CAD s/p CABG with unstable angina: Cardiac cath 02/06/18 with severe disease in all vein grafts with patent LIMA graft but severe disease beyond the anastomosis in the native LAD. CT surgery consult has been placed by Robert Burns to consider re-do CABG. If he is not felt to  be a candidate for redo CABG, would need multi-vessel PCI.  Plavix is on hold while awaiting CT surgery consult. Continue ASA, statin, Imdur and beta blocker. I will let him eat today.   2. HTN: BP controlled.   3. Ischemic cardiomyopathy: LVEF=40-45% by LV gram. Will arrange echo today.   4. Hypokalemia: replace potassium this am.   For questions or updates, please contact [place] Please  consult [email or web address] for contact info under        Signed, Lauree Chandler, MD  02/07/2018, 7:55 AM

## 2018-02-07 NOTE — Progress Notes (Signed)
  Echocardiogram 2D Echocardiogram has been performed.  Robert Burns 02/07/2018, 11:28 AM

## 2018-02-07 NOTE — Consult Note (Signed)
Reason for Consult:CAD Referring Physician: Dr. Claiborne Billings, Dr. Gwenlyn Found is Cardiologist  Robert Burns is an 77 y.o. male.  HPI: 77 yo man with a past history of CAD, s/p CABG x 5 in 2004, multiple PCI over past 2 years, PAD, hypertension, hyperlipidemia, OSA (CPAP), SSS, permanent pacemaker, type II diabetes and arthritis. Presents with substernal CP worsening over the past 3 weeks. Sometimes exertional, but not consistent, also has had episodes at rest. Was being evaluated for TURP/ prostate surgery and was sent for cardiac clearance.  Cath yesterday showed severe native and 3 vessel CAD.  Currently pain free.  Past Medical History:  Diagnosis Date  . Arthritis    "all over" (11/25/2015)  . CAD (coronary artery disease)    a. s/p CABG  06/2002; b. 02/01/15 PCI: DES to prox SVG to PDA, staged PCI of SVG to Diag in 02/2015; c. 04/2017 Cath/PCI: LM nl, LAD 100ost, 67m 75d, LCX 60ost, OM2 80, RCA 100ost, RPDA 80, LIMA->LAD ok, VG->D1 patent stent, VG->RPDA patent stent, 50p, VG->OM1->OM2 90p (3.0x24 Synergy DES), 100 between OM1->OM2 (med rx).  . Cancer ([place]    Right Shoulder, Left Leg- BCC, SCC, AND MELANOMA  . Hyperlipidemia   . Hypertension   . Leg pain   . OSA on CPAP   . PAD (peripheral artery disease) ([place]    a. 10/2002 L SFA PTA/BMS; b. 8/17 LE Angio: LEIA 90 (9x40 self exp stent), LSFA short segment prox occlusion (staged PTA/stenting 01/03/2016), patent mid stent, RSFA 957mstaged PTA/DEB 02/14/2016).  . Presence of permanent cardiac pacemaker   . SSS (sick sinus syndrome) ([facility]   a. s/p PPM in 2007 with gen change 04/2015 - Medtronic Adapta ADDRL1, ser # [id number].  . Type II diabetes mellitus ([facility]  . Urgency of urination     Past Surgical History:  Procedure Laterality Date  . CARDIAC CATHETERIZATION N/A 02/01/2015   Procedure: Left Heart Cath and Coronary Angiography;  Surgeon: JoLorretta HarpMD;  [facility];  Service: Cardiovascular;  Laterality: N/A;  .  CARDIAC CATHETERIZATION N/A 02/01/2015   Procedure: Coronary Stent Intervention;  Surgeon: JoLorretta HarpMD;  [facility];  Service: Cardiovascular;  Laterality: N/A;  . CARDIAC CATHETERIZATION  06/2002   "just before bypass OR"  . CARDIAC CATHETERIZATION N/A 03/01/2015   Procedure: Coronary Stent Intervention;  Surgeon: JoLorretta HarpMD;  [facility];  Service: Cardiovascular;  Laterality: N/A;  . CARDIAC CATHETERIZATION  02/06/2018  . CORONARY ANGIOPLASTY    . CORONARY ARTERY BYPASS GRAFT  06/2002   x5, LIMA-LAD;VG- Diag; seq VG- ramus & OM branch; VG-PDA  . CORONARY STENT INTERVENTION N/A 04/12/2017   Procedure: CORONARY STENT INTERVENTION;  Surgeon: BeLorretta HarpMD;  [facility];  Service: Cardiovascular;  Laterality: N/A;  . EP IMPLANTABLE DEVICE N/A 04/23/2015   Procedure: PPM Generator Changeout;  Surgeon: MiSanda KleinMD;  [facility];  Service: Cardiovascular;  Laterality: N/A;  . FEMORAL ARTERY STENT Left ~ 2014   "taken out of my leg; couldn' catorgorize what kind so the put it under all 3"; cataroziepitheloid hemanioendotheliomau  . FOOT FRACTURE SURGERY Left 1973  . FRACTURE SURGERY    . INSERT / REPLACE / REMOVE PACEMAKER  10/13/05   right side, medtronic Adapta  . KNEE HARDWARE REMOVAL Right 1950's   "3-4 months after the insertion"  . KNEE SURGERY Right 1950's   "broke my  lower leg; had to put pin in my knee to keep lower leg in place til it healed"  . LEFT HEART CATH AND CORS/GRAFTS ANGIOGRAPHY N/A 04/12/2017   Procedure: LEFT HEART CATH AND CORS/GRAFTS ANGIOGRAPHY;  Surgeon: Lorretta Harp, MD;  Location: [facility] [facility];  Service: Cardiovascular;  Laterality: N/A;  . LEFT HEART CATH AND CORS/GRAFTS ANGIOGRAPHY N/A 02/06/2018   Procedure: LEFT HEART CATH AND CORS/GRAFTS ANGIOGRAPHY;  Surgeon: Troy Sine, MD;  Location: [place] [facility];  Service: Cardiovascular;  Laterality: N/A;  .  PERIPHERAL VASCULAR CATHETERIZATION N/A 11/25/2015   Procedure: Lower Extremity Angiography;  Surgeon: Lorretta Harp, MD;  Location: [facility];  Service: Cardiovascular;  Laterality: N/A;  . PERIPHERAL VASCULAR CATHETERIZATION Left 11/25/2015   Procedure: Peripheral Vascular Intervention;  Surgeon: Lorretta Harp, MD;  Location: [facility];  Service: Cardiovascular;  Laterality: Left;  external iliac  . PERIPHERAL VASCULAR CATHETERIZATION N/A 01/03/2016   Procedure: Lower Extremity Angiography;  Surgeon: Lorretta Harp, MD;  Location: [facility] [place] [facility];  Service: Cardiovascular;  Laterality: N/A;  . PERIPHERAL VASCULAR CATHETERIZATION Left 01/03/2016   Procedure: Peripheral Vascular Intervention;  Surgeon: Lorretta Harp, MD;  Location: [facility];  Service: Cardiovascular;  Laterality: Left;  SFA  . PERIPHERAL VASCULAR CATHETERIZATION Right 02/14/2016   Procedure: Peripheral Vascular Atherectomy;  Surgeon: Lorretta Harp, MD;  Location: [facility];  Service: Cardiovascular;  Laterality: Right;  SFA  . POPLITEAL ARTERY STENT  01/03/2016   Contralateral access with a 7 French crossover sheath (second order catheter placement)  . TONSILLECTOMY AND ADENOIDECTOMY    . TUMOR EXCISION Right ~ 2005   cancerous tumor removed from shoulder  . TUMOR EXCISION Right ~ 2000   benign tumor removed from under shoulder  . TUMOR EXCISION Left 06/02/2010   resection of Lt SFA wth interposition of Gore-Tex graft    Family History  Problem Relation Age of Onset  . Coronary artery disease Mother   . Heart attack Mother   . Hypertension Mother   . Heart disease Mother        Open  Heart surgery  . Diabetes Father   . Heart disease Father   . Hyperlipidemia Father   . Heart attack Father   . Hypertension Sister   . Diabetes Sister   . Heart disease Sister     Social History:  reports that he quit smoking about 46 years ago. His smoking use included pipe. He quit  after 10.00 years of use. He has never used smokeless tobacco. He reports that he does not drink alcohol or use drugs.  Allergies:  Allergies  Allergen Reactions  . Carvedilol Itching  . Peanut-Containing Drug Products Anaphylaxis and Other (See Comments)    Tongue swelling is severe Other reaction(s): Unknown  . Ace Inhibitors Other (See Comments), Rash and Hives    Cough Other reaction(s): Unknown Cough   . Diltiazem Hcl Other (See Comments)    Unknown Other reaction(s): Other (See Comments), Unknown Unknown   . Duloxetine Hcl     Other reaction(s): Other Urinary frequency  . Meloxicam Other (See Comments)    GI upset Other reaction(s): Other (See Comments), Unknown GI upset GI upset GI BLEED   . Clonidine Hcl Other (See Comments)    Patch only - skin irritation    Medications:  Prior to Admission:  Medications Prior to Admission  Medication Sig Dispense Refill Last Dose  .  amLODipine (NORVASC) 10 MG tablet Take 10 mg by mouth daily.    02/06/2018 at 0530  . Cholecalciferol (VITAMIN D3) 2000 units capsule Take 2,000 Units by mouth 2 (two) times daily.   02/06/2018 at 0530  . clopidogrel (PLAVIX) 75 MG tablet Take 1 tablet (75 mg total) by mouth daily with breakfast. 90 tablet 3 02/06/2018 at 0530  . glipiZIDE (GLUCOTROL) 5 MG tablet Take 5 mg by mouth every evening.   Past Week at Unknown time  . hydrALAZINE (APRESOLINE) 25 MG tablet Take 25 mg by mouth 3 (three) times daily.   02/06/2018 at 0530  . hydrochlorothiazide (HYDRODIURIL) 25 MG tablet Take 25 mg by mouth every morning.   02/05/2018 at Unknown time  . metFORMIN (GLUCOPHAGE) 1000 MG tablet Take 1,000 mg by mouth 2 (two) times daily with a meal.     Past Week at Unknown time  . metoprolol tartrate (LOPRESSOR) 100 MG tablet Take 1 tablet (100 mg total) by mouth 2 (two) times daily. 180 tablet 3 02/06/2018 at 0530  . potassium chloride SA (K-DUR,KLOR-CON) 20 MEQ tablet Take 1 tablet (20 mEq total) by mouth daily.  (Patient taking differently: Take 20 mEq by mouth every evening. ) 15 tablet 0 02/05/2018 at Unknown time  . rOPINIRole (REQUIP) 1 MG tablet Take 1 mg by mouth at bedtime.   02/05/2018 at Unknown time  . sodium chloride (OCEAN) 0.65 % SOLN nasal spray Place 1 spray into both nostrils as needed for congestion.    02/05/2018 at Unknown time  . tamsulosin (FLOMAX) 0.4 MG CAPS capsule Take 0.4 mg by mouth every evening.    02/05/2018 at Unknown time  . nitroGLYCERIN (NITROSTAT) 0.4 MG SL tablet Place 1 tablet (0.4 mg total) under the tongue every 5 (five) minutes as needed for chest pain. 25 tablet 3 Unknown at Unknown time    Results for orders placed or performed during the hospital encounter of 02/06/18 (from the past 48 hour(s))  Glucose, capillary     Status: Abnormal   Collection Time: 02/06/18  6:41 AM  Result Value Ref Range   Glucose-Capillary 167 (H) 70 - 99 mg/dL  Glucose, capillary     Status: Abnormal   Collection Time: 02/06/18  9:58 AM  Result Value Ref Range   Glucose-Capillary 141 (H) 70 - 99 mg/dL  CBC     Status: Abnormal   Collection Time: 02/06/18 11:27 AM  Result Value Ref Range   WBC 10.3 4.0 - 10.5 K/uL   RBC 4.86 4.22 - 5.81 MIL/uL   Hemoglobin 12.6 (L) 13.0 - 17.0 g/dL   HCT 41.4 39.0 - 52.0 %   MCV 85.2 80.0 - 100.0 fL   MCH 25.9 (L) 26.0 - 34.0 pg   MCHC 30.4 30.0 - 36.0 g/dL   RDW 14.1 11.5 - 15.5 %   Platelets 183 150 - 400 K/uL   nRBC 0.0 0.0 - 0.2 %    Comment: Performed at [facility], [place] [address] [place], [place] [address]  Creatinine, serum     Status: None   Collection Time: 02/06/18 11:27 AM  Result Value Ref Range   Creatinine, Ser 0.88 0.61 - 1.24 mg/dL   GFR calc non Af Amer >60 >60 mL/min   GFR calc Af Amer >60 >60 mL/min    Comment: (NOTE) The eGFR has been calculated using the CKD EPI equation. This calculation has not been validated in all clinical situations. eGFR's persistently <60 mL/min signify possible Chronic  Kidney Disease. Performed at [facility], [place] [address], [place], [place] [address]   Glucose, capillary     Status: Abnormal   Collection Time: 02/06/18 11:48 AM  Result Value Ref Range   Glucose-Capillary 137 (H) 70 - 99 mg/dL  Glucose, capillary     Status: Abnormal   Collection Time: 02/06/18  5:13 PM  Result Value Ref Range   Glucose-Capillary 113 (H) 70 - 99 mg/dL  Glucose, capillary     Status: Abnormal   Collection Time: 02/06/18  9:22 PM  Result Value Ref Range   Glucose-Capillary 136 (H) 70 - 99 mg/dL   Comment 1 Notify RN    Comment 2 Document in Chart   Basic metabolic panel     Status: Abnormal   Collection Time: 02/07/18  6:43 AM  Result Value Ref Range   Sodium 138 135 - 145 mmol/L   Potassium 3.6 3.5 - 5.1 mmol/L   Chloride 110 98 - 111 mmol/L   CO2 23 22 - 32 mmol/L   Glucose, Bld 138 (H) 70 - 99 mg/dL   BUN 20 8 - 23 mg/dL   Creatinine, Ser 1.03 0.61 - 1.24 mg/dL   Calcium 8.9 8.9 - 10.3 mg/dL   GFR calc non Af Amer >60 >60 mL/min   GFR calc Af Amer >60 >60 mL/min    Comment: (NOTE) The eGFR has been calculated using the CKD EPI equation. This calculation has not been validated in all clinical situations. eGFR's persistently <60 mL/min signify possible Chronic Kidney Disease.    Anion gap 5 5 - 15    Comment: Performed at [place] [address], [place] [address]  CBC     Status: Abnormal   Collection Time: 02/07/18  6:43 AM  Result Value Ref Range   WBC 10.8 (H) 4.0 - 10.5 K/uL   RBC 4.33 4.22 - 5.81 MIL/uL   Hemoglobin 11.7 (L) 13.0 - 17.0 g/dL   HCT 36.9 (L) 39.0 - 52.0 %   MCV 85.2 80.0 - 100.0 fL   MCH 27.0 26.0 - 34.0 pg   MCHC 31.7 30.0 - 36.0 g/dL   RDW 14.1 11.5 - 15.5 %   Platelets 163 150 - 400 K/uL   nRBC 0.0 0.0 - 0.2 %    Comment: Performed at [facility], [place] [address] [place], [place] [address]  Glucose, capillary     Status: Abnormal   Collection Time: 02/07/18  7:57 AM  Result Value Ref  Range   Glucose-Capillary 151 (H) 70 - 99 mg/dL  Glucose, capillary     Status: Abnormal   Collection Time: 02/07/18 11:57 AM  Result Value Ref Range   Glucose-Capillary 151 (H) 70 - 99 mg/dL   Comment 1 Notify RN    Comment 2 Document in Chart   Glucose, capillary     Status: Abnormal   Collection Time: 02/07/18  4:48 PM  Result Value Ref Range   Glucose-Capillary 133 (H) 70 - 99 mg/dL    No results found.  Review of Systems  Constitutional: Positive for malaise/fatigue. Negative for weight loss.  Eyes: Negative for blurred vision and double vision.  Respiratory: Positive for shortness of breath.   Cardiovascular: Positive for chest pain.  Gastrointestinal: Negative for nausea and vomiting.  Genitourinary: Positive for frequency and urgency.  Musculoskeletal: Positive for joint pain.  Neurological: Negative for speech change, focal weakness and loss of consciousness.   Blood pressure [name])  157/63, pulse 63, temperature (!) 97.5 F (36.4 C), temperature source Oral, resp. rate 18, height 5' 8" (1.727 m), weight 83.6 kg, SpO2 95 %. Physical Exam  Vitals reviewed. Constitutional: He is oriented to person, place, and time. He appears well-developed and well-nourished. No distress.  HENT:  Head: Normocephalic and atraumatic.  Mouth/Throat: No oropharyngeal exudate.  Eyes: Conjunctivae and EOM are normal. No scleral icterus.  Neck: Neck supple. No thyromegaly present.  Cardiovascular: Normal rate, regular rhythm and normal heart sounds. Exam reveals no gallop and no friction rub.  No murmur heard. Respiratory: Effort normal and breath sounds normal. No respiratory distress. He has no wheezes. He has no rales.  GI: Soft. He exhibits no distension. There is no tenderness.  Musculoskeletal: He exhibits no edema.  Lymphadenopathy:    He has no cervical adenopathy.  Neurological: He is alert and oriented to person, place, and time. No cranial nerve deficit. He exhibits normal muscle  tone. Coordination normal.  Skin: Skin is warm and dry.  Well healed surgical scars chest and both legs    Assessment/Plan: Mr. Mcneill is a 77 yo man with multiple CRF including hypertension, hyperlipidemia, type II diabetes. He has a long history of CAD. He had a CABG in 2004. Some targets were noted to be diffusely diseased. He did well until about 2 years ago when he developed recurrent angina. Has had multiple percutaneous interventions since then. Now presents again with recurrent angina and has severe native left main and 3 vessel CAD as well as graft disease.  Difficult situation given his age and the severity of his native CAD. Targets do not appear favorable for good long term outcome with redo CABG.  PCI may be high risk but overall likely lower risk than redo.     Melrose Nakayama 02/07/2018, 5:03 PM

## 2018-02-07 NOTE — H&P (View-Only) (Signed)
Progress Note  Patient Name: Robert Burns Date of Encounter: 02/07/2018  Primary Cardiologist: Quay Burow, MD   Subjective   No chest pain or dyspnea.   Inpatient Medications    Scheduled Meds: . amLODipine  10 mg Oral Daily  . aspirin  81 mg Oral Daily  . atorvastatin  80 mg Oral q1800  . glipiZIDE  5 mg Oral QPM  . heparin  5,000 Units Subcutaneous Q8H  . hydrALAZINE  25 mg Oral TID  . isosorbide mononitrate  60 mg Oral Daily  . metoprolol tartrate  100 mg Oral BID  . rOPINIRole  1 mg Oral QHS  . sodium chloride flush  3 mL Intravenous Q12H  . tamsulosin  0.4 mg Oral QPM   Continuous Infusions: . sodium chloride 50 mL/hr at 02/06/18 1555  . sodium chloride     PRN Meds: sodium chloride, acetaminophen, diazepam, nitroGLYCERIN, ondansetron (ZOFRAN) IV, sodium chloride flush   Vital Signs    Vitals:   02/06/18 1955 02/06/18 2114 02/07/18 0113 02/07/18 0554  BP: 136/90 (!) 135/51 135/61 (!) 151/68  Pulse: 63 62 61 66  Resp: 18  18 18   Temp: 98.7 F (37.1 C)  (!) 97.5 F (36.4 C)   TempSrc: Oral  Oral   SpO2: 96%  98% 97%  Weight:    83.6 kg  Height:        Intake/Output Summary (Last 24 hours) at 02/07/2018 0755 Last data filed at 02/07/2018 0601 Gross per 24 hour  Intake 657.78 ml  Output 1500 ml  Net -842.22 ml   Filed Weights   02/06/18 0637 02/07/18 0554  Weight: 81.2 kg 83.6 kg    Telemetry    Atrial paced - Personally Reviewed  ECG    No AM EKG - Personally Reviewed  Physical Exam   GEN: No acute distress.   Neck: No JVD Cardiac: RRR, no murmurs, rubs, or gallops.  Respiratory: Clear to auscultation bilaterally. GI: Soft, nontender, non-distended  MS: No edema; No deformity. Neuro:  Nonfocal  Psych: Normal affect   Labs    Chemistry Recent Labs  Lab 02/05/18 1208 02/06/18 1127 02/07/18 0643  NA 139  --  138  K 4.7  --  3.6  CL 103  --  110  CO2 27  --  23  GLUCOSE 251*  --  138*  BUN 23  --  20  CREATININE  1.01 0.88 1.03  CALCIUM 9.5  --  8.9  GFRNONAA 70 >60 >60  GFRAA 81 >60 >60  ANIONGAP  --   --  5     Hematology Recent Labs  Lab 02/05/18 1208 02/06/18 1127 02/07/18 0643  WBC 10.4 10.3 10.8*  RBC 4.71 4.86 4.33  HGB 13.0 12.6* 11.7*  HCT 39.3 41.4 36.9*  MCV 83 85.2 85.2  MCH 27.6 25.9* 27.0  MCHC 33.1 30.4 31.7  RDW 14.9 14.1 14.1  PLT 164 183 163    Cardiac EnzymesNo results for input(s): TROPONINI in the last 168 hours. No results for input(s): TROPIPOC in the last 168 hours.   BNPNo results for input(s): BNP, PROBNP in the last 168 hours.   DDimer No results for input(s): DDIMER in the last 168 hours.   Radiology    No results Burns.  Cardiac Studies   Cardiac cath 02/06/18: Severe multivessel native CAD with 50% distal left main stenosis, 95% ostial LAD, ramus intermediate, and 40% ostial circumflex stenosis.  The LAD is totally occluded proximally.  The ramus intermediate vessel is totally occluded proximally.  The circumflex vessel has diffuse irregularity in its mid segment and has subtotal occlusion of a distal branch of a marginal vessel.  The RCA is totally occluded proximally.  The LIMA graft is widely patent and supplies the mid LAD.  Beyond the graft insertion there is focal 80% mid LAD stenosis and in the distal apical portion of the LAD there is diffuse narrowing of 70-90%.  The sequential vein graft which had supplied the ramus intermediate and circumflex marginal vessel has a stent in its proximal segment.  There is diffuse narrowing of 85% within the stented segment followed by 40 to 50% mid graft stenosis.  The graft anastomoses into a diminutive ramus intermediate branch and then is totally occluded in its sequential limb.  The vein graft supplying the diagonal vessel had a stent placed proximally in January 2019 now has focal 95% in-stent stenosis.  There also is a stent in the distal portion the graft before the diagonal takeoff.  The vein  graft supplying a large PDA and distal RCA has a patent proximal stent and then has 75 to 80% stenosis beyond the stented segment in the mid RCA graft and appears to have 80% diffuse narrowing in the region of the acute margin portion of the graft.  Mild to moderate LV dysfunction with an ejection fraction of 40 to 45% and mid to basal inferior hypocontractility.  LVEDP 18 mm.  RECOMMENDATION: Robert Burns underwent CABG revascularization in 2004 by Robert Burns.  He has diffuse disease in all his vein grafts as well as disease beyond his LIMA insertion.  Repeat surgical consultation will be obtained for assessment of possible redo CABG revascularization surgery.  The patient has been on chronic dual antiplatelet therapy and his Plavix will be held pending surgical evaluation.  If he is not felt to be a candidate for redo CABG surgery, would then consider probable stage PCI with intervention to the focal in-stent restenosis in the vein graft supplying the diagonal vessel, PCI via the LIMA graft to the mid and distal LAD and additional stenting to the graft supplying the large PDA vessel.  I contacted Robert Burns following the procedure made him aware of the catheterization findings.  If he undergoes CABG surgery, I would reinstitute DAPT unstable postoperatively.  If he is not felt to be a surgical candidate, DAPT therapy should be continued indefinitely.  Diagnostic Diagram        Patient Profile     77 y.o. male with history of CAD s/p 5V CABG in 2004, PAD, SSS s/p PPM, OSA, DM, HTN, HLD with unstable angina. Cath on 02/06/18 per Robert Burns with severe disease in all grafts, severe native vessel disease.   Assessment & Plan    1. CAD s/p CABG with unstable angina: Cardiac cath 02/06/18 with severe disease in all vein grafts with patent LIMA graft but severe disease beyond the anastomosis in the native LAD. CT surgery consult has been placed by Robert Burns to consider re-do CABG. If he is not felt to  be a candidate for redo CABG, would need multi-vessel PCI.  Plavix is on hold while awaiting CT surgery consult. Continue ASA, statin, Imdur and beta blocker. I will let him eat today.   2. HTN: BP controlled.   3. Ischemic cardiomyopathy: LVEF=40-45% by LV gram. Will arrange echo today.   4. Hypokalemia: replace potassium this am.   For questions or updates, please contact [place] Please  consult [email or web address] for contact info under        Signed, Lauree Chandler, MD  02/07/2018, 7:55 AM

## 2018-02-08 ENCOUNTER — Encounter (HOSPITAL_COMMUNITY)
Admission: AD | Disposition: A | Discharge status: Home / Self Care | Payer: Self-pay | Source: Home / Self Care | Attending: Cardiovascular Disease

## 2018-02-08 ENCOUNTER — Encounter (HOSPITAL_COMMUNITY): Payer: Self-pay | Admitting: Interventional Cardiology

## 2018-02-08 DIAGNOSIS — I25708 Atherosclerosis of coronary artery bypass graft(s), unspecified, with other forms of angina pectoris: Secondary | ICD-10-CM

## 2018-02-08 DIAGNOSIS — I25700 Atherosclerosis of coronary artery bypass graft(s), unspecified, with unstable angina pectoris: Principal | ICD-10-CM

## 2018-02-08 HISTORY — PX: CORONARY STENT INTERVENTION: CATH118234

## 2018-02-08 LAB — CBC
HEMATOCRIT: 36.9 % — AB (ref 39.0–52.0)
Hemoglobin: 11.3 g/dL — ABNORMAL LOW (ref 13.0–17.0)
MCH: 26.2 pg (ref 26.0–34.0)
MCHC: 30.6 g/dL (ref 30.0–36.0)
MCV: 85.6 fL (ref 80.0–100.0)
NRBC: 0 % (ref 0.0–0.2)
Platelets: 150 10*3/uL (ref 150–400)
RBC: 4.31 MIL/uL (ref 4.22–5.81)
RDW: 14 % (ref 11.5–15.5)
WBC: 9.4 10*3/uL (ref 4.0–10.5)

## 2018-02-08 LAB — POCT ACTIVATED CLOTTING TIME
ACTIVATED CLOTTING TIME: 268 s
ACTIVATED CLOTTING TIME: 329 s
Activated Clotting Time: 180 seconds

## 2018-02-08 LAB — GLUCOSE, CAPILLARY
Glucose-Capillary: 154 mg/dL — ABNORMAL HIGH (ref 70–99)
Glucose-Capillary: 180 mg/dL — ABNORMAL HIGH (ref 70–99)
Glucose-Capillary: 207 mg/dL — ABNORMAL HIGH (ref 70–99)

## 2018-02-08 LAB — BASIC METABOLIC PANEL
ANION GAP: 8 (ref 5–15)
BUN: 23 mg/dL (ref 8–23)
CALCIUM: 8.8 mg/dL — AB (ref 8.9–10.3)
CO2: 23 mmol/L (ref 22–32)
Chloride: 107 mmol/L (ref 98–111)
Creatinine, Ser: 1.17 mg/dL (ref 0.61–1.24)
GFR, EST NON AFRICAN AMERICAN: 57 mL/min — AB (ref >60)
GLUCOSE: 277 mg/dL — AB (ref 70–99)
POTASSIUM: 3.6 mmol/L (ref 3.5–5.1)
Sodium: 138 mmol/L (ref 135–145)

## 2018-02-08 SURGERY — CORONARY STENT INTERVENTION
Anesthesia: LOCAL

## 2018-02-08 MED ORDER — SODIUM CHLORIDE 0.9% FLUSH
3.0000 mL | Freq: Two times a day (BID) | INTRAVENOUS | Status: DC
  Administered 2018-02-08: 22:00:00 3 mL via INTRAVENOUS

## 2018-02-08 MED ORDER — SODIUM CHLORIDE 0.9 % WEIGHT BASED INFUSION
3.0000 mL/kg/h | INTRAVENOUS | Status: DC
  Administered 2018-02-08: 3 mL/kg/h via INTRAVENOUS

## 2018-02-08 MED ORDER — SODIUM CHLORIDE 0.9% FLUSH: 3.0000 mL | INTRAVENOUS | Status: DC | PRN

## 2018-02-08 MED ORDER — MORPHINE SULFATE (PF) 2 MG/ML IV SOLN
2.0000 mg | Freq: Once | INTRAVENOUS | Status: AC
  Administered 2018-02-08: 14:00:00 2 mg via INTRAVENOUS
  Filled 2018-02-08: qty 2

## 2018-02-08 MED ORDER — CLOPIDOGREL BISULFATE 75 MG PO TABS
75.0000 mg | ORAL_TABLET | Freq: Every day | ORAL | Status: DC
  Administered 2018-02-09: 75 mg via ORAL
  Filled 2018-02-08: qty 1

## 2018-02-08 MED ORDER — NITROGLYCERIN 1 MG/10 ML FOR IR/CATH LAB
INTRA_ARTERIAL | Status: DC | PRN
  Administered 2018-02-08: 200 ug via INTRACORONARY

## 2018-02-08 MED ORDER — ANGIOPLASTY BOOK
Freq: Once | Status: AC
  Administered 2018-02-08: 23:00:00
  Filled 2018-02-08: qty 1

## 2018-02-08 MED ORDER — SODIUM CHLORIDE 0.9 % IV SOLN: 250.0000 mL | INTRAVENOUS | Status: DC | PRN

## 2018-02-08 MED ORDER — LIDOCAINE HCL (PF) 1 % IJ SOLN
INTRAMUSCULAR | Status: DC | PRN
  Administered 2018-02-08: 30 mL

## 2018-02-08 MED ORDER — IOHEXOL 350 MG/ML SOLN
INTRAVENOUS | Status: DC | PRN
  Administered 2018-02-08: 145 mL via INTRA_ARTERIAL

## 2018-02-08 MED ORDER — NITROGLYCERIN 1 MG/10 ML FOR IR/CATH LAB
INTRA_ARTERIAL | Status: AC
  Filled 2018-02-08: qty 10

## 2018-02-08 MED ORDER — SODIUM CHLORIDE 0.9 % IV SOLN
INTRAVENOUS | Status: AC
  Administered 2018-02-08: 12:00:00 via INTRAVENOUS

## 2018-02-08 MED ORDER — LIDOCAINE HCL (PF) 1 % IJ SOLN
INTRAMUSCULAR | Status: AC
  Filled 2018-02-08: qty 30

## 2018-02-08 MED ORDER — HEPARIN (PORCINE) IN NACL 1000-0.9 UT/500ML-% IV SOLN
INTRAVENOUS | Status: AC
  Filled 2018-02-08: qty 1000

## 2018-02-08 MED ORDER — ASPIRIN 81 MG PO CHEW: 81.0000 mg | CHEWABLE_TABLET | Freq: Every day | ORAL | Status: DC

## 2018-02-08 MED ORDER — HEPARIN (PORCINE) IN NACL 1000-0.9 UT/500ML-% IV SOLN
INTRAVENOUS | Status: DC | PRN
  Administered 2018-02-08 (×2): 500 mL

## 2018-02-08 MED ORDER — FENTANYL CITRATE (PF) 100 MCG/2ML IJ SOLN
INTRAMUSCULAR | Status: AC
  Filled 2018-02-08: qty 2

## 2018-02-08 MED ORDER — HYDRALAZINE HCL 20 MG/ML IJ SOLN
5.0000 mg | INTRAMUSCULAR | Status: AC | PRN
  Administered 2018-02-08: 12:00:00 5 mg via INTRAVENOUS
  Filled 2018-02-08: qty 1

## 2018-02-08 MED ORDER — SODIUM CHLORIDE 0.9% FLUSH: 3.0000 mL | Freq: Two times a day (BID) | INTRAVENOUS | Status: DC

## 2018-02-08 MED ORDER — MIDAZOLAM HCL 2 MG/2ML IJ SOLN
INTRAMUSCULAR | Status: DC | PRN
  Administered 2018-02-08: 2 mg via INTRAVENOUS

## 2018-02-08 MED ORDER — CLOPIDOGREL BISULFATE 300 MG PO TABS
600.0000 mg | ORAL_TABLET | Freq: Once | ORAL | Status: AC
  Administered 2018-02-08: 600 mg via ORAL
  Filled 2018-02-08: qty 8
  Filled 2018-02-08: qty 2

## 2018-02-08 MED ORDER — FAMOTIDINE IN NACL 20-0.9 MG/50ML-% IV SOLN
20.0000 mg | Freq: Once | INTRAVENOUS | Status: AC
  Administered 2018-02-08: 20 mg via INTRAVENOUS
  Filled 2018-02-08: qty 50

## 2018-02-08 MED ORDER — MIDAZOLAM HCL 2 MG/2ML IJ SOLN
INTRAMUSCULAR | Status: AC
  Filled 2018-02-08: qty 2

## 2018-02-08 MED ORDER — FENTANYL CITRATE (PF) 100 MCG/2ML IJ SOLN
INTRAMUSCULAR | Status: DC | PRN
  Administered 2018-02-08: 25 ug via INTRAVENOUS

## 2018-02-08 MED ORDER — ACETAMINOPHEN 325 MG PO TABS: 650.0000 mg | ORAL_TABLET | ORAL | Status: DC | PRN

## 2018-02-08 MED ORDER — ADENOSINE 6 MG/2ML IV SOLN
INTRAVENOUS | Status: AC
  Filled 2018-02-08: qty 2

## 2018-02-08 MED ORDER — ONDANSETRON HCL 4 MG/2ML IJ SOLN: 4.0000 mg | Freq: Four times a day (QID) | INTRAMUSCULAR | Status: DC | PRN

## 2018-02-08 MED ORDER — HEPARIN SODIUM (PORCINE) 1000 UNIT/ML IJ SOLN
INTRAMUSCULAR | Status: DC | PRN
  Administered 2018-02-08: 3000 [IU] via INTRAVENOUS
  Administered 2018-02-08: 5000 [IU] via INTRAVENOUS
  Administered 2018-02-08: 8000 [IU] via INTRAVENOUS
  Administered 2018-02-08: 3000 [IU] via INTRAVENOUS

## 2018-02-08 MED ORDER — HEPARIN SODIUM (PORCINE) 1000 UNIT/ML IJ SOLN
INTRAMUSCULAR | Status: AC
  Filled 2018-02-08: qty 1

## 2018-02-08 MED ORDER — MIDAZOLAM HCL 2 MG/2ML IJ SOLN
INTRAMUSCULAR | Status: DC | PRN
  Administered 2018-02-08: 1 mg via INTRAVENOUS

## 2018-02-08 MED ORDER — ASPIRIN 81 MG PO CHEW
81.0000 mg | CHEWABLE_TABLET | ORAL | Status: AC
  Administered 2018-02-08: 81 mg via ORAL

## 2018-02-08 MED ORDER — ADENOSINE (DIAGNOSTIC) FOR INTRACORONARY USE
INTRAVENOUS | Status: DC | PRN
  Administered 2018-02-08 (×3): 60 ug via INTRACORONARY

## 2018-02-08 MED ORDER — LABETALOL HCL 5 MG/ML IV SOLN: 10.0000 mg | INTRAVENOUS | Status: AC | PRN

## 2018-02-08 MED ORDER — SODIUM CHLORIDE 0.9 % WEIGHT BASED INFUSION
1.0000 mL/kg/h | INTRAVENOUS | Status: DC
  Administered 2018-02-08: 1 mL/kg/h via INTRAVENOUS

## 2018-02-08 SURGICAL SUPPLY — 31 items
BALLN SAPPHIRE 2.0X12 (BALLOONS) ×2
BALLN SAPPHIRE 2.5X12 (BALLOONS) ×2
BALLN SAPPHIRE ~~LOC~~ 3.25X10 (BALLOONS) ×2 IMPLANT
BALLN SAPPHIRE ~~LOC~~ 3.5X15 (BALLOONS) ×2 IMPLANT
BALLN WOLVERINE 2.50X10 (BALLOONS) ×2
BALLN WOLVERINE 3.25X10 (BALLOONS) ×2
BALLN ~~LOC~~ EMERGE MR 2.5X8 (BALLOONS) ×2
BALLOON SAPPHIRE 2.0X12 (BALLOONS) ×1 IMPLANT
BALLOON SAPPHIRE 2.5X12 (BALLOONS) ×1 IMPLANT
BALLOON WOLVERINE 2.50X10 (BALLOONS) ×1 IMPLANT
BALLOON WOLVERINE 3.25X10 (BALLOONS) ×1 IMPLANT
BALLOON ~~LOC~~ EMERGE MR 2.5X8 (BALLOONS) ×1 IMPLANT
CATH LAUNCHER 6FR AL1 (CATHETERS) ×1 IMPLANT
CATH VISTA GUIDE 7FR IM 90 CM (CATHETERS) ×2 IMPLANT
CATHETER LAUNCHER 6FR AL1 (CATHETERS) ×2
ELECT DEFIB PAD ADLT CADENCE (PAD) ×2 IMPLANT
KIT ENCORE 26 ADVANTAGE (KITS) ×2 IMPLANT
KIT HEART LEFT (KITS) ×2 IMPLANT
KIT HEMO VALVE WATCHDOG (MISCELLANEOUS) ×2 IMPLANT
PACK CARDIAC CATHETERIZATION (CUSTOM PROCEDURE TRAY) ×2 IMPLANT
SHEATH PINNACLE 6F 10CM (SHEATH) ×2 IMPLANT
SHEATH PINNACLE 7F 10CM (SHEATH) ×2 IMPLANT
SHEATH PROBE COVER 6X72 (BAG) ×2 IMPLANT
STENT RESOLUTE ONYX 2.25X15 (Permanent Stent) ×2 IMPLANT
STENT RESOLUTE ONYX 2.25X18 (Permanent Stent) ×2 IMPLANT
TRANSDUCER W/STOPCOCK (MISCELLANEOUS) ×2 IMPLANT
TUBING CIL FLEX 10 FLL-RA (TUBING) ×2 IMPLANT
WIRE ASAHI PROWATER 180CM (WIRE) ×4 IMPLANT
WIRE EMERALD 3MM-J .035X150CM (WIRE) ×4 IMPLANT
WIRE HI TORQ BMW 190CM (WIRE) ×2 IMPLANT
WIRE HI TORQ VERSACORE-J 145CM (WIRE) ×2 IMPLANT

## 2018-02-08 NOTE — Progress Notes (Signed)
Pt admitted following diagnostic cath which showed stent restenosis in two of the vein grafts and in the native LAD beyond graft insertion. He was not felt to be a good candidate for redo CABG. He underwent angioplasty of the restenotic lesions in both vein grafts and placement of 2 drug eluting stents in the native LAD today. He is doing well post PCI. Will plan d/c home tomorrow if stable. Plans for medical management of the disease in the vein graft to the RCA.   Lauree Chandler 02/08/2018 2:21 PM

## 2018-02-08 NOTE — Interval H&P Note (Signed)
Cath Lab Visit (complete for each Cath Lab visit)  Clinical Evaluation Leading to the Procedure:   ACS: No.  Non-ACS:    Anginal Classification: CCS III  Anti-ischemic medical therapy: Minimal Therapy (1 class of medications)  Non-Invasive Test Results: No non-invasive testing performed  Prior CABG: Previous CABG      History and Physical Interval Note:  02/08/2018 7:48 AM  Robert Burns  has presented today for surgery, with the diagnosis of cad  The various methods of treatment have been discussed with the patient and family. After consideration of risks, benefits and other options for treatment, the patient has consented to  Procedure(s): CORONARY STENT INTERVENTION (N/A) as a surgical intervention .  The patient's history has been reviewed, patient examined, no change in status, stable for surgery.  I have reviewed the patient's chart and labs.  Questions were answered to the patient's satisfaction.     Larae Grooms

## 2018-02-08 NOTE — Plan of Care (Signed)
  Problem: Education: Goal: Knowledge of General Education information will improve Description Including pain rating scale, medication(s)/side effects and non-pharmacologic comfort measures Outcome: Progressing   Problem: Health Behavior/Discharge Planning: Goal: Ability to manage health-related needs will improve Outcome: Progressing   Problem: Clinical Measurements: Goal: Ability to maintain clinical measurements within normal limits will improve Outcome: Progressing   Problem: Clinical Measurements: Goal: Diagnostic test results will improve Outcome: Progressing   Problem: Clinical Measurements: Goal: Respiratory complications will improve Outcome: Progressing   Problem: Elimination: Goal: Will not experience complications related to urinary retention Outcome: Progressing   Problem: Pain Managment: Goal: General experience of comfort will improve Outcome: Progressing

## 2018-02-08 NOTE — Progress Notes (Signed)
Site area: right groin  Site Prior to Removal:  Level 1  Pressure Applied For 20 MINUTES    Minutes Beginning at 1500  Manual:   Yes.    Patient Status During Pull:  stable  Post Pull Groin Site:  Level 1  Post Pull Instructions Given:  Yes.    Post Pull Pulses Present:  Yes.    Dressing Applied:  Yes.    Comments:

## 2018-02-08 NOTE — Progress Notes (Signed)
Inpatient Diabetes Program Recommendations  AACE/ADA: New Consensus Statement on Inpatient Glycemic Control (2015)  Target Ranges:  Prepandial:   less than 140 mg/dL      Peak postprandial:   less than 180 mg/dL (1-2 hours)      Critically ill patients:  140 - 180 mg/dL   Results for DEMAREE, LIBERTO (MRN [id number]) as of 02/08/2018 12:16  Ref. Range 02/07/2018 07:57 02/07/2018 11:57 02/07/2018 16:48 02/07/2018 21:22  Glucose-Capillary Latest Ref Range: 70 - 99 mg/dL 151 (H) 151 (H) 133 (H) 161 (H)   Results for SAFI, CULOTTA (MRN [id number]) as of 02/08/2018 12:16  Ref. Range 02/08/2018 10:31  Glucose-Capillary Latest Ref Range: 70 - 99 mg/dL 154 (H)    Admit with: CP  History: DM  Home DM Meds: Metformin 1000 mg BID       Glipizide 5 mg QPM  Current Orders: Glipizide 5 mg QPM     MD- Please consider placing orders for Novolog Sensitive Correction Scale/ SSI (0-9 units) TID AC + HS    --Will follow patient during hospitalization--  Wyn Quaker RN, MSN, CDE Diabetes Coordinator Inpatient Glycemic Control Team Team Pager: [phone] (8a-5p)

## 2018-02-09 DIAGNOSIS — Z950 Presence of cardiac pacemaker: Secondary | ICD-10-CM

## 2018-02-09 DIAGNOSIS — I480 Paroxysmal atrial fibrillation: Secondary | ICD-10-CM

## 2018-02-09 DIAGNOSIS — I2510 Atherosclerotic heart disease of native coronary artery without angina pectoris: Secondary | ICD-10-CM

## 2018-02-09 DIAGNOSIS — I208 Other forms of angina pectoris: Secondary | ICD-10-CM

## 2018-02-09 LAB — CBC
HEMATOCRIT: 34.6 % — AB (ref 39.0–52.0)
Hemoglobin: 10.6 g/dL — ABNORMAL LOW (ref 13.0–17.0)
MCH: 26.2 pg (ref 26.0–34.0)
MCHC: 30.6 g/dL (ref 30.0–36.0)
MCV: 85.4 fL (ref 80.0–100.0)
Platelets: 167 10*3/uL (ref 150–400)
RBC: 4.05 MIL/uL — AB (ref 4.22–5.81)
RDW: 14.2 % (ref 11.5–15.5)
WBC: 12.3 10*3/uL — ABNORMAL HIGH (ref 4.0–10.5)
nRBC: 0 % (ref 0.0–0.2)

## 2018-02-09 LAB — BASIC METABOLIC PANEL
ANION GAP: 4 — AB (ref 5–15)
BUN: 21 mg/dL (ref 8–23)
CHLORIDE: 110 mmol/L (ref 98–111)
CO2: 24 mmol/L (ref 22–32)
Calcium: 8.4 mg/dL — ABNORMAL LOW (ref 8.9–10.3)
Creatinine, Ser: 1.03 mg/dL (ref 0.61–1.24)
GFR calc non Af Amer: >60 mL/min (ref >60)
GLUCOSE: 153 mg/dL — AB (ref 70–99)
Potassium: 3.7 mmol/L (ref 3.5–5.1)
Sodium: 138 mmol/L (ref 135–145)

## 2018-02-09 LAB — GLUCOSE, CAPILLARY: Glucose-Capillary: 120 mg/dL — ABNORMAL HIGH (ref 70–99)

## 2018-02-09 MED ORDER — ATORVASTATIN CALCIUM 80 MG PO TABS: 80.0000 mg | ORAL_TABLET | Freq: Every day | ORAL | 1 refills | Status: AC

## 2018-02-09 MED ORDER — ISOSORBIDE MONONITRATE ER 60 MG PO TB24: 60.0000 mg | ORAL_TABLET | Freq: Every day | ORAL | 6 refills | Status: DC

## 2018-02-09 MED ORDER — CLOPIDOGREL BISULFATE 75 MG PO TABS: 75.0000 mg | ORAL_TABLET | Freq: Every day | ORAL | 3 refills | Status: DC

## 2018-02-09 MED ORDER — METFORMIN HCL 1000 MG PO TABS: 1000.0000 mg | ORAL_TABLET | Freq: Two times a day (BID) | ORAL | Status: DC

## 2018-02-09 MED ORDER — ASPIRIN 81 MG PO CHEW: 81.0000 mg | CHEWABLE_TABLET | Freq: Every day | ORAL | Status: DC

## 2018-02-09 NOTE — Progress Notes (Signed)
Pt has home CPAP set up at bedside. Pt states he does not need any further assistance.

## 2018-02-09 NOTE — Progress Notes (Signed)
Noted pt.has some changes in his EKG which is RBBB ;MD on call Bazemore was made aware & ordered to do another EKG after 2 hrs.

## 2018-02-09 NOTE — Progress Notes (Signed)
CARDIAC REHAB PHASE I   PRE:  Rate/Rhythm: 60 pacing    BP: sitting 157/58    SaO2:   MODE:  Ambulation: 400 ft   POST:  Rate/Rhythm: 103 pacing    BP: sitting 178/85     SaO2: 98 RA  Fairly steady. Started having lower chest pressure at end of walk. 1/10, resolved in 1 min. Ed completed/reviewed. Reinforced importance of Plavix/ASA and also carrying NTG. Gave light ex gl but also discussed angina in depth. He sts he was able to do CRPII in [facility] this past spring. Encouraged CRPII again before [employer]. Will refer here to have access to Epic and [facility].  [place], ACSM 02/09/2018 9:06 AM

## 2018-02-09 NOTE — Progress Notes (Signed)
Repeat EKG done & Dr.Bazemore made aware .

## 2018-02-09 NOTE — Discharge Summary (Addendum)
Discharge Summary    Patient ID: Robert Burns MRN: [id number]; DOB: [birth year]  Admit date: 02/06/2018 Discharge date: 02/09/2018  Primary Care Provider: [name], MD  Primary Cardiologist: Quay Burow, MD  EP: Dr. 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 Carvedilol Itching  . Peanut-Containing Drug Products Anaphylaxis and Other (See Comments)    Tongue swelling is severe Other reaction(s): Unknown  . Ace Inhibitors Other (See Comments), Rash and Hives    Cough Other reaction(s): Unknown Cough   . Diltiazem Hcl Other (See Comments)    Unknown Other reaction(s): Other (See Comments), Unknown Unknown   . Duloxetine Hcl     Other reaction(s): Other Urinary frequency  . Meloxicam Other (See Comments)    GI upset Other reaction(s): Other (See Comments), Unknown GI upset GI upset GI BLEED   . Clonidine Hcl Other (See Comments)    Patch only - skin irritation    Diagnostic Studies/Procedures     02/06/18 LEFT HEART CATH AND CORS/GRAFTS ANGIOGRAPHY  Conclusion     Ost Ramus lesion is 95% stenosed.  Ost LAD to Prox LAD lesion is 100% stenosed.  Dist Graft lesion is 5% stenosed.  Prox Graft lesion is 95% stenosed.  Origin to Mid Graft lesion before Prox LAD is 85% stenosed.  Ost Cx lesion is 40% stenosed.  Ost RCA to Prox RCA lesion is 100% stenosed.  Previously placed Origin to Prox Graft stent (unknown type) is widely patent.  Prox Graft lesion is 75% stenosed.  Mid Graft to Dist Graft lesion is 80% stenosed.  Prox Graft to Dist Graft lesion between Ramus  and 2nd Mrg is 100% stenosed.  Prox Cx lesion is 30% stenosed.  Mid LM lesion is 50% stenosed.  Ost 3rd Mrg to 3rd Mrg lesion is 99% stenosed.  Mid LAD-2 lesion is 80% stenosed.  Dist LAD-1 lesion is 70% stenosed.  Dist LAD-2 lesion is 90% stenosed.   Severe multivessel native CAD with 50% distal left main stenosis, 95% ostial LAD, ramus intermediate, and 40% ostial circumflex stenosis.  The LAD is totally occluded proximally.  The ramus intermediate vessel is totally occluded proximally.  The circumflex vessel has diffuse irregularity in its mid segment and has subtotal occlusion of a distal branch of a marginal vessel.  The RCA is totally occluded proximally.  The LIMA graft is widely patent and supplies the mid LAD.  Beyond the graft insertion there is focal 80% mid LAD stenosis and in the distal apical portion of the LAD there is diffuse narrowing of 70-90%.  The sequential vein graft which had supplied the ramus intermediate and circumflex marginal vessel has a stent in its proximal segment.  There is diffuse narrowing of 85% within the stented segment followed by 40 to 50% mid graft stenosis.  The graft anastomoses into a diminutive ramus intermediate branch and then is totally occluded in its sequential limb.  The vein graft supplying the diagonal vessel had a stent placed proximally in January 2019 now has focal 95% in-stent stenosis.  There also is a stent in the distal portion the  graft before the diagonal takeoff.  The vein graft supplying a large PDA and distal RCA has a patent proximal stent and then has 75 to 80% stenosis beyond the stented segment in the mid RCA graft and appears to have 80% diffuse narrowing in the region of the acute margin portion of the graft.  Mild to moderate LV dysfunction with an ejection fraction of 40 to 45% and mid to basal inferior hypocontractility.  LVEDP 18 mm.  RECOMMENDATION: Robert Burns underwent CABG revascularization in 2004 by Dr.  Roxan Hockey.  He has diffuse disease in all his vein grafts as well as disease beyond his LIMA insertion.  Repeat surgical consultation will be obtained for assessment of possible redo CABG revascularization surgery.  The patient has been on chronic dual antiplatelet therapy and his Plavix will be held pending surgical evaluation.  If he is not felt to be a candidate for redo CABG surgery, would then consider probable stage PCI with intervention to the focal in-stent restenosis in the vein graft supplying the diagonal vessel, PCI via the LIMA graft to the mid and distal LAD and additional stenting to the graft supplying the large PDA vessel.  I contacted Dr. Gwenlyn Found following the procedure made him aware of the catheterization findings.  If he undergoes CABG surgery, I would reinstitute DAPT unstable postoperatively.  If he is not felt to be a surgical candidate, DAPT therapy should be continued indefinitely.     _____________   Echo 02/07/18 Study Conclusions - Left ventricle: Abnormal septal motion and posterior basal   hypokinesis. The cavity size was mildly dilated. Wall thickness   was normal. The estimated ejection fraction was 55%. Left   ventricular diastolic function parameters were normal. - Left atrium: The atrium was moderately dilated. - Right ventricle: The cavity size was mildly dilated. - Right atrium: The atrium was mildly dilated.  _____________  02/08/18 CORONARY STENT INTERVENTION  Conclusion    Prox Graft lesion is 95% stenosed; SVG to Diagonal. Treated with cutting balloon followed by 3.25 [place] balloon.  Post intervention, there is a 0% residual stenosis.  Origin to Mid Graft lesion of SVG to OM is 85% stenosed.  Prox Graft to Dist Graft lesion between Ramus and 2nd Mrg is 100% stenosed.  Balloon angioplasty was performed using a BALLOON SAPPHIRE [place] 3.5X15. Not much territory supplied, causing slower flow.  Post intervention, there is a 0% residual  stenosis.  Dist LAD lesion is 90% stenosed.  A drug-eluting stent was successfully placed using a STENT RESOLUTE [place] 2.25X18.  Post intervention, there is a 0% residual stenosis.  Severe Mid LAD-1 lesion just after anastamosis.  A drug-eluting stent was successfully placed using a [place] 2.25X15.  Post intervention, there is a 0% residual stenosis.    Recommend uninterrupted dual antiplatelet therapy with Aspirin 81mg  daily and Clopidogrel 75mg  daily for a minimum of 6 months (stable ischemic heart disease - Class I recommendation).   Continue aggressive medical therapy.  F/u w/ Dr.  Gwenlyn Found.  Could consider PCI to SVG to RCA at a later time.      History of Present Illness     Robert Burns is a 77 y.o. male with a history of CAD s/p CABG x 5 in 2004, multiple PCI over past 2 years, PAD, HTN, HLD, OSA (CPAP), SSS s/p permanent pacemaker, type II diabetes and arthritis who presented to [facility] on 02/06/18 for outpatient cath.   He has a history of CABG 06/2002 (LIMA to  LAD, vein graft to diagonal branch, SVG to ramus, and OM, and vein graft to PDA). The patient also has peripheral vascular disease and is status post left SFA/PTA and stenting in July 2004. The patient has a pacemaker in situ which was placed for sick sinus syndrome in 11 of 2008, chronic leg pain with space-occupying lesion which was removed by Dr. Kellie Simmering with placement of interposition 6 mm Gore-Tex graft. Repeat  aortogram was completed in October 2016 which revealed a high-grade segmental proximal right SFA disease and a subtotally occluded diagonal branch S VG which did require stenting of his RCA/SVG on 02/01/2015, with a stage diagonal branch SVG intervention on 03/01/2015. Also, he required stenting of his left internal iliac artery and staged intervention of a 90% calcified segment in the mid right SFA on 11/25/2015 with a diamondback orbital rotational atherectomy and DES of a mid calcified SFA in the  setting of recurrent leg pain.  He did well until about 2 years ago when he developed recurrent angina. Has had multiple percutaneous interventions since then.   He was seen in the office by Jory Sims NP on 02/04/18 with substernal CP worsening over the past 3 weeks. Sometimes exertional, but not consistent, also has had episodes at rest. Was being evaluated for TURP/ prostate surgery and was sent for cardiac clearance. He was set up for diagnostic cath on 02/06/18.    Hospital Course     Consultants: none    CAD: diagnostic cath showed stent restenosis in two of the vein grafts and in the native LAD beyond graft insertion. He was evaluated by Dr. Roxan Hockey and was not felt to be a good candidate for redo CABG. He underwent angioplasty of the restenotic lesions in both vein grafts and placement of 2 drug eluting stents in the native LAD on 02/08/18. Continue aggressive medical therapy. Could consider PCI to SVG to RCA at a later time. Continue DAPT with plavix and aspirin for 6 months of therapy. He has been started on Imdur 60mg  daily. Continue BB and statin   HTN: BP well controlled currently. Continue home regimen  HLD: Rx'd high dose statin  DMT2: resume home regimen but hold Metformin x 48 hours after contrast dye exposure  S/p PPM: followed by Dr. Sallyanne Kuster. Needs an appointment with Dr. Sallyanne Kuster ( Dr. Loletha Grayer says okay to double book)  PAF: found on device interrogation in 09/2017. Most recent interrogation showed no afib. Will continue to monitor and if he has recurrence we will need to discuss long term oral anticoagulation.  _____________  Discharge Vitals Blood pressure (!) 122/41, pulse 60, temperature 98.3 F (36.8 C), resp. rate (!) 23, height 5\' 8"  (1.727 m), weight 83.6 kg, SpO2 95 %.  Filed Weights   02/07/18 0554 02/08/18 0319 02/09/18 0400  Weight: 83.6 kg 82.9 kg 83.6 kg     Labs & Radiologic Studies    CBC Recent Labs    02/08/18 0000 02/09/18 0352  WBC  9.4 12.3*  HGB 11.3* 10.6*  HCT 36.9* 34.6*  MCV 85.6 85.4  PLT 150 659   Basic Metabolic Panel Recent Labs    02/08/18 0000 02/09/18 0352  NA 138 138  K 3.6 3.7  CL 107 110  CO2 23 24  GLUCOSE 277* 153*  BUN 23 21  CREATININE 1.17 1.03  CALCIUM 8.8* 8.4*   Liver Function Tests No results for input(s): AST, ALT, ALKPHOS, BILITOT, PROT, ALBUMIN in the last 72 hours. No results for input(s): LIPASE, AMYLASE  in the last 72 hours. Cardiac Enzymes No results for input(s): CKTOTAL, CKMB, CKMBINDEX, TROPONINI in the last 72 hours. BNP Invalid input(s): POCBNP D-Dimer No results for input(s): DDIMER in the last 72 hours. Hemoglobin A1C No results for input(s): HGBA1C in the last 72 hours. Fasting Lipid Panel No results for input(s): CHOL, HDL, LDLCALC, TRIG, CHOLHDL, LDLDIRECT in the last 72 hours. Thyroid Function Tests No results for input(s): TSH, T4TOTAL, T3FREE, THYROIDAB in the last 72 hours.  Invalid input(s): FREET3 _____________  No results found. Disposition   Pt is being discharged home today in good condition.  Follow-up Plans & Appointments    Follow-up Information    Lorretta Harp, MD Follow up.   Specialties:  Cardiology, Radiology Why:  The office will call you to arrange an appointment with Dr. Gwenlyn Found or one of his APPs in 1-2 weeks.  Contact information: [address] [place] [address] [phone]          Discharge Instructions    Amb Referral to Cardiac Rehabilitation   Complete by:  As directed    Diagnosis:   PTCA Coronary Stents        Discharge Medications   Allergies as of 02/09/2018      Reactions   Carvedilol Itching   Peanut-containing Drug Products Anaphylaxis, Other (See Comments)   Tongue swelling is severe Other reaction(s): Unknown   Ace Inhibitors Other (See Comments), Rash, Hives   Cough Other reaction(s): Unknown Cough   Diltiazem Hcl Other (See Comments)   Unknown Other reaction(s):  Other (See Comments), Unknown Unknown   Duloxetine Hcl    Other reaction(s): Other Urinary frequency   Meloxicam Other (See Comments)   GI upset Other reaction(s): Other (See Comments), Unknown GI upset GI upset GI BLEED   Clonidine Hcl Other (See Comments)   Patch only - skin irritation      Medication List    TAKE these medications   amLODipine 10 MG tablet Commonly known as:  NORVASC Take 10 mg by mouth daily.   aspirin 81 MG chewable tablet Chew 1 tablet (81 mg total) by mouth daily.   atorvastatin 80 MG tablet Commonly known as:  LIPITOR Take 1 tablet (80 mg total) by mouth daily at 6 PM.   clopidogrel 75 MG tablet Commonly known as:  PLAVIX Take 1 tablet (75 mg total) by mouth daily with breakfast.   glipiZIDE 5 MG tablet Commonly known as:  GLUCOTROL Take 5 mg by mouth every evening.   hydrALAZINE 25 MG tablet Commonly known as:  APRESOLINE Take 25 mg by mouth 3 (three) times daily.   hydrochlorothiazide 25 MG tablet Commonly known as:  HYDRODIURIL Take 25 mg by mouth every morning.   isosorbide mononitrate 60 MG 24 hr tablet Commonly known as:  IMDUR Take 1 tablet (60 mg total) by mouth daily. Start taking on:  02/10/2018   metFORMIN 1000 MG tablet Commonly known as:  GLUCOPHAGE Take 1 tablet (1,000 mg total) by mouth 2 (two) times daily with a meal. Start taking on:  02/10/2018   metoprolol tartrate 100 MG tablet Commonly known as:  LOPRESSOR Take 1 tablet (100 mg total) by mouth 2 (two) times daily.   nitroGLYCERIN 0.4 MG SL tablet Commonly known as:  NITROSTAT Place 1 tablet (0.4 mg total) under the tongue every 5 (five) minutes as needed for chest pain.   potassium chloride SA 20 MEQ tablet Commonly known as:  K-DUR,KLOR-CON Take 1 tablet (20 mEq total)  by mouth daily. What changed:  when to take this   rOPINIRole 1 MG tablet Commonly known as:  REQUIP Take 1 mg by mouth at bedtime.   sodium chloride 0.65 % Soln nasal spray Commonly  known as:  OCEAN Place 1 spray into both nostrils as needed for congestion.   tamsulosin 0.4 MG Caps capsule Commonly known as:  FLOMAX Take 0.4 mg by mouth every evening.   Vitamin D3 2000 units capsule Take 2,000 Units by mouth 2 (two) times daily.        Acute coronary syndrome (MI, NSTEMI, STEMI, etc) this admission?: No.    Outstanding Labs/Studies   none  Duration of Discharge Encounter   Greater than 30 minutes including physician time.  Signed, Angelena Form, PA-C 02/09/2018, 9:43 AM   I have seen and examined the patient along with Angelena Form, PA-C.  I have reviewed the chart, notes and new data.  I agree with PA/NP's note.  Key new complaints: had a brief "twinge" of angina walking in the hallway today. Key examination changes: large ecchymosis in R groin, but no hematoma, no bruit. Ecchymosis tracks to scrotum. Key new findings / data: angiograms reviewed.  PLAN: DC home. Mandatory DAPT for 6 months. Will have to delay prostate procedure. We detected a brief episode of paroxysmal atrial fibrillation on his pacemaker check a few months ago (no recurrence on the October download), but have not yet started anticoagulation (CHADSVasc 5). Under the current circumstances, will delay initiation of DOAC unless pacer check shows more atrial fibrillation. Imdur added as an antianginal (he is likely to have occasional exertional angina due to residual disease in RCA territory).  Sanda Klein, MD, [place] [phone] 02/09/2018, 9:53 AM

## 2018-02-11 ENCOUNTER — Telehealth (HOSPITAL_COMMUNITY): Payer: Self-pay

## 2018-02-11 LAB — POCT ACTIVATED CLOTTING TIME
ACTIVATED CLOTTING TIME: 263 s
ACTIVATED CLOTTING TIME: 285 s

## 2018-02-11 NOTE — Telephone Encounter (Signed)
Pt insurance is active and benefits verified through [facility]. Co-pay $10.00, DED $0.00/$0.00 met, out of pocket $5,400.00/$2,157.78 met, co-insurance 0%. No pre-authorization. Passport, 02/11/18 @ 1:58PM, REF# [id number]  Will contact patient to see if he is interested in the Cardiac Rehab Program. If interested, patient will need to complete follow up appt. Once completed, patient will be contacted for scheduling upon review by the RN Navigator.

## 2018-02-12 ENCOUNTER — Telehealth (HOSPITAL_COMMUNITY): Payer: Self-pay

## 2018-02-12 ENCOUNTER — Telehealth: Payer: Self-pay | Admitting: Adult Health

## 2018-02-12 NOTE — Telephone Encounter (Signed)
Attempted to contact patient to see if he is interested in the Cardiac Rehab Program. [facility]

## 2018-02-12 NOTE — Telephone Encounter (Signed)
  LVM for callback to set up TOC appt with Arnold Long per staff message

## 2018-02-12 NOTE — Telephone Encounter (Signed)
  Patient also needs appt with Dr Sallyanne Kuster for pacemaker follow up first available per staff message

## 2018-02-14 NOTE — Progress Notes (Signed)
Cardiology Office Note   Date:  02/14/2018   ID:  DASHIELL Burns, DOB 06-07-1940, MRN [id number]  PCP:  [name], MD  Cardiologist:  Avelina Laine  No chief complaint on file.    History of Present Illness: Robert Burns is a 77 y.o. male who presents for post hospitalization follow up after admission for cardiac cath in the setting of know multivessel CAD, CABG 2004 (LIMA to LAD, vein graft to diagonal branch, SVG to ramus, and OM, and vein graft to PDA).  and recurrent chest pain. Other history includes PAD, status post left SFA/PTA and stenting in July 2004. The patient has a pacemaker in situ which was placed for sick sinus syndrome in 11 of 2008, chronic leg pain with space-occupying lesion which was removed by Dr. Kellie Simmering with placement of interposition 6 mm Gore-Tex graft; with other history to include OSA on CPAP.  He was seen in the office on 02/04/2018 for pre-operative clearance to have TURP with Dr. Stann Mainland at [facility] urology. Due to his symptoms, he was sent for cardiac cath.  This was completed on 02/08/2018, revealing a distal LAD with 90% stenosed. This required DES intervention reducing lesion to 0% residual stenosis. It was also found to have severe mid LAD lesion just after the anastomosis requiring DES stent reducing to 0% stenosis.  He was started on Plavix and ASA for a minimum of 6 months before elective surgery.   PAD Hx:  Repeat  aortogram was completed in October 2016 which revealed a high-grade segmental proximal right SFA disease and a subtotally occluded diagonal branch S VG which did require stenting of his RCA/SVG on 02/01/2015, with a stage diagonal branch SVG intervention on 03/01/2015.   Also, he required stenting of his left internal iliac artery and staged intervention of a 90% calcified segment in the mid right SFA on 11/25/2015 with a diamondback orbital rotational atherectomy and DES of a mid calcified SFA in the setting of recurrent leg  pain.  He comes today stating that he is "feeling worse" since recent discharge. He has been having diarrhea, fatigue, dizziness. He states he is eating and drinking ok.  He is medically complaint. Daughter states that he seems less energetic.   Past Medical History:  Diagnosis Date  . Arthritis    "all over" (11/25/2015)  . CAD (coronary artery disease)    a. s/p CABG  06/2002; b. 02/01/15 PCI: DES to prox SVG to PDA, staged PCI of SVG to Diag in 02/2015; c. 04/2017 Cath/PCI: LM nl, LAD 100ost, 26m, 75d, LCX 60ost, OM2 80, RCA 100ost, RPDA 80, LIMA->LAD ok, VG->D1 patent stent, VG->RPDA patent stent, 50p, VG->OM1->OM2 90p (3.0x24 Synergy DES), 100 between OM1->OM2 (med rx).  . Cancer ([place])    Right Shoulder, Left Leg- BCC, SCC, AND MELANOMA  . Hyperlipidemia   . Hypertension   . Leg pain   . OSA on CPAP   . PAD (peripheral artery disease) ([place])    a. 10/2002 L SFA PTA/BMS; b. 8/17 LE Angio: LEIA 90 (9x40 self exp stent), LSFA short segment prox occlusion (staged PTA/stenting 01/03/2016), patent mid stent, RSFA 36m (staged PTA/DEB 02/14/2016).  . Presence of permanent cardiac pacemaker   . SSS (sick sinus syndrome) ([place])    a. s/p PPM in 2007 with gen change 04/2015 - Medtronic Adapta ADDRL1, ser # [id number].  . Type II diabetes mellitus ([place])   . Urgency of urination     Past Surgical History:  Procedure Laterality Date  .  CARDIAC CATHETERIZATION N/A 02/01/2015   Procedure: Left Heart Cath and Coronary Angiography;  Surgeon: Lorretta Harp, MD;  Location: [place] [facility];  Service: Cardiovascular;  Laterality: N/A;  . CARDIAC CATHETERIZATION N/A 02/01/2015   Procedure: Coronary Stent Intervention;  Surgeon: Lorretta Harp, MD;  Location: [place] [facility];  Service: Cardiovascular;  Laterality: N/A;  . CARDIAC CATHETERIZATION  06/2002   "just before bypass OR"  . CARDIAC CATHETERIZATION N/A 03/01/2015   Procedure: Coronary Stent Intervention;  Surgeon: Lorretta Harp, MD;   Location: [place] [facility];  Service: Cardiovascular;  Laterality: N/A;  . CARDIAC CATHETERIZATION  02/06/2018  . CORONARY ANGIOPLASTY    . CORONARY ARTERY BYPASS GRAFT  06/2002   x5, LIMA-LAD;VG- Diag; seq VG- ramus & OM branch; VG-PDA  . CORONARY STENT INTERVENTION N/A 04/12/2017   Procedure: CORONARY STENT INTERVENTION;  Surgeon: Lorretta Harp, MD;  Location: [place] [facility];  Service: Cardiovascular;  Laterality: N/A;  . CORONARY STENT INTERVENTION N/A 02/08/2018   Procedure: CORONARY STENT INTERVENTION;  Surgeon: Jettie Booze, MD;  Location: [place] [facility];  Service: Cardiovascular;  Laterality: N/A;  . EP IMPLANTABLE DEVICE N/A 04/23/2015   Procedure: PPM Generator Changeout;  Surgeon: Sanda Klein, MD;  Location: [place] [facility];  Service: Cardiovascular;  Laterality: N/A;  . FEMORAL ARTERY STENT Left ~ 2014   "taken out of my leg; couldn' catorgorize what kind so the put it under all 3"; cataroziepitheloid hemanioendotheliomau  . FOOT FRACTURE SURGERY Left 1973  . FRACTURE SURGERY    . INSERT / REPLACE / REMOVE PACEMAKER  10/13/05   right side, medtronic Adapta  . KNEE HARDWARE REMOVAL Right 1950's   "3-4 months after the insertion"  . KNEE SURGERY Right 1950's   "broke my lower leg; had to put pin in my knee to keep lower leg in place til it healed"  . LEFT HEART CATH AND CORS/GRAFTS ANGIOGRAPHY N/A 04/12/2017   Procedure: LEFT HEART CATH AND CORS/GRAFTS ANGIOGRAPHY;  Surgeon: Lorretta Harp, MD;  Location: [place] [facility];  Service: Cardiovascular;  Laterality: N/A;  . LEFT HEART CATH AND CORS/GRAFTS ANGIOGRAPHY N/A 02/06/2018   Procedure: LEFT HEART CATH AND CORS/GRAFTS ANGIOGRAPHY;  Surgeon: Troy Sine, MD;  Location: [place] [facility];  Service: Cardiovascular;  Laterality: N/A;  . PERIPHERAL VASCULAR CATHETERIZATION N/A 11/25/2015   Procedure: Lower Extremity Angiography;  Surgeon: Lorretta Harp, MD;  Location: [place] [facility];  Service:  Cardiovascular;  Laterality: N/A;  . PERIPHERAL VASCULAR CATHETERIZATION Left 11/25/2015   Procedure: Peripheral Vascular Intervention;  Surgeon: Lorretta Harp, MD;  Location: [place] [facility];  Service: Cardiovascular;  Laterality: Left;  external iliac  . PERIPHERAL VASCULAR CATHETERIZATION N/A 01/03/2016   Procedure: Lower Extremity Angiography;  Surgeon: Lorretta Harp, MD;  Location: [place] [facility];  Service: Cardiovascular;  Laterality: N/A;  . PERIPHERAL VASCULAR CATHETERIZATION Left 01/03/2016   Procedure: Peripheral Vascular Intervention;  Surgeon: Lorretta Harp, MD;  Location: [place] [facility];  Service: Cardiovascular;  Laterality: Left;  SFA  . PERIPHERAL VASCULAR CATHETERIZATION Right 02/14/2016   Procedure: Peripheral Vascular Atherectomy;  Surgeon: Lorretta Harp, MD;  Location: [place] [facility];  Service: Cardiovascular;  Laterality: Right;  SFA  . POPLITEAL ARTERY STENT  01/03/2016   Contralateral access with a 7 French crossover sheath (second order catheter placement)  . TONSILLECTOMY AND ADENOIDECTOMY    . TUMOR EXCISION Right ~ 2005  cancerous tumor removed from shoulder  . TUMOR EXCISION Right ~ 2000   benign tumor removed from under shoulder  . TUMOR EXCISION Left 06/02/2010   resection of Lt SFA wth interposition of Gore-Tex graft     Current Outpatient Medications  Medication Sig Dispense Refill  . amLODipine (NORVASC) 10 MG tablet Take 10 mg by mouth daily.     [name] aspirin 81 MG chewable tablet Chew 1 tablet (81 mg total) by mouth daily.    [name] atorvastatin (LIPITOR) 80 MG tablet Take 1 tablet (80 mg total) by mouth daily at 6 PM. 90 tablet 1  . Cholecalciferol (VITAMIN D3) 2000 units capsule Take 2,000 Units by mouth 2 (two) times daily.    . clopidogrel (PLAVIX) 75 MG tablet Take 1 tablet (75 mg total) by mouth daily with breakfast. 90 tablet 3  . glipiZIDE (GLUCOTROL) 5 MG tablet Take 5 mg by mouth every evening.    . hydrALAZINE (APRESOLINE) 25  MG tablet Take 25 mg by mouth 3 (three) times daily.    . hydrochlorothiazide (HYDRODIURIL) 25 MG tablet Take 25 mg by mouth every morning.    . isosorbide mononitrate (IMDUR) 60 MG 24 hr tablet Take 1 tablet (60 mg total) by mouth daily. 30 tablet 6  . metFORMIN (GLUCOPHAGE) 1000 MG tablet Take 1 tablet (1,000 mg total) by mouth 2 (two) times daily with a meal.    . metoprolol tartrate (LOPRESSOR) 100 MG tablet Take 1 tablet (100 mg total) by mouth 2 (two) times daily. 180 tablet 3  . nitroGLYCERIN (NITROSTAT) 0.4 MG SL tablet Place 1 tablet (0.4 mg total) under the tongue every 5 (five) minutes as needed for chest pain. 25 tablet 3  . potassium chloride SA (K-DUR,KLOR-CON) 20 MEQ tablet Take 1 tablet (20 mEq total) by mouth daily. (Patient taking differently: Take 20 mEq by mouth every evening. ) 15 tablet 0  . rOPINIRole (REQUIP) 1 MG tablet Take 1 mg by mouth at bedtime.    . sodium chloride (OCEAN) 0.65 % SOLN nasal spray Place 1 spray into both nostrils as needed for congestion.     . tamsulosin (FLOMAX) 0.4 MG CAPS capsule Take 0.4 mg by mouth every evening.      No current facility-administered medications for this visit.     Allergies:   Carvedilol; Peanut-containing drug products; Ace inhibitors; Diltiazem hcl; Duloxetine hcl; Meloxicam; and Clonidine hcl    Social History:  The patient  reports that he quit smoking about 46 years ago. His smoking use included pipe. He quit after 10.00 years of use. He has never used smokeless tobacco. He reports that he does not drink alcohol or use drugs.   Family History:  The patient's family history includes Coronary artery disease in his mother; Diabetes in his father and sister; Heart attack in his father and mother; Heart disease in his father, mother, and sister; Hyperlipidemia in his father; Hypertension in his mother and sister.    ROS: All other systems are reviewed and negative. Unless otherwise mentioned in H&P    PHYSICAL EXAM: VS:   There were no vitals taken for this visit. , BMI There is no height or weight on file to calculate BMI. GEN: Well nourished, well developed, in no acute distress. Pale  HEENT: normal Neck: no JVD, carotid bruits, or masses Cardiac: RRR; no murmurs, rubs, or gallops,no edema  Respiratory:  Clear to auscultation bilaterally, normal work of breathing GI: soft, nontender, nondistended, + BS MS: no deformity  or atrophy Skin: warm and dry, no rash Neuro:  Strength and sensation are intact Psych: euthymic mood, full affect   EKG: Atrial paced rhythm with prolonged AV conduction. HR of 71 bpm.  Recent Labs: 03/28/2017: TSH 0.964 02/09/2018: BUN 21; Creatinine, Ser 1.03; Hemoglobin 10.6; Platelets 167; Potassium 3.7; Sodium 138    Lipid Panel No results found for: CHOL, TRIG, HDL, CHOLHDL, VLDL, LDLCALC, LDLDIRECT    Wt Readings from Last 3 Encounters:  02/09/18 184 lb 6.4 oz (83.6 kg)  02/04/18 186 lb 3.2 oz (84.5 kg)  09/04/17 191 lb 3.2 oz (86.7 kg)      Other studies Reviewed: Cardiac Cath 02/08/2018. Conclusion     Prox Graft lesion is 95% stenosed; SVG to Diagonal. Treated with cutting balloon followed by 3.25 [place] balloon.  Post intervention, there is a 0% residual stenosis.  Origin to Mid Graft lesion of SVG to OM is 85% stenosed.  Prox Graft to Dist Graft lesion between Ramus and 2nd Mrg is 100% stenosed.  Balloon angioplasty was performed using a BALLOON SAPPHIRE [place] 3.5X15. Not much territory supplied, causing slower flow.  Post intervention, there is a 0% residual stenosis.  Dist LAD lesion is 90% stenosed.  A drug-eluting stent was successfully placed using a STENT RESOLUTE ONYX 2.25X18.  Post intervention, there is a 0% residual stenosis.  Severe Mid LAD-1 lesion just after anastamosis.  A drug-eluting stent was successfully placed using a [place] 2.25X15.  Post intervention, there is a 0% residual stenosis.    Recommend uninterrupted dual  antiplatelet therapy with Aspirin 81mg  daily and Clopidogrel 75mg  daily for a minimum of 6 months (stable ischemic heart disease - Class I recommendation).   Continue aggressive medical therapy.  F/u w/ Dr.  Gwenlyn Found.  Could consider PCI to SVG to RCA at a later time.      ASSESSMENT AND PLAN:  1.  CAD: Recent cardiac cath with PCI to the distal LAD due to 90% stenosis, reducing to 0% stenosis, and LAD lesion just after the anastomosis requiring DES stent.  Consideration for PCI to SVG if symptoms persisted. He states that he feels worse since recent intervention. He has had diarrhea and fatigue. He denies bleeding or excessive bruising on Plavix and ASA. No bleeding during diarrhea. He has occasional twinges of chest discomfort.    BP is low and he is dizzy. I will decrease his isosorbide to 45 mg from 60 mg, has he was increased from 30 mg while hospitalized.  Checking BMET and CBC. He does not appear to be dehydrated, but does appear decondition   2. Diarrhea:  I will check stool for C-Diff since he was hospitalized. He describes this as watery stool with significant bubbling and mild pain in the abdomen.   3. PPM in situ: Sees  Dr. Sallyanne Kuster in 2 days for [facility] check. He can review labs and discuss his symptoms on follow up.   4. PAD: To be followed by Dr. Gwenlyn Found. He states he feels numbness in his feet, but pulses are palpable. May be related to diabetes.   5 Hypercholesterolemia: He is taking both lovastatin and atorvastatin. He is to only take atorvastatin. This may be contributing to his stomach upset.   6. Type II Diabetes: Followed by PCP  Current medicines are reviewed at length with the patient today.    Labs/ tests ordered today include: CBC, BMET, C-Diff  Phill Myron. Felicidad Sugarman DNP, ANP, AACC   02/14/2018 8:20 AM  [address] Office [phone] Fax [phone]

## 2018-02-18 ENCOUNTER — Encounter: Payer: Self-pay | Admitting: Adult Health

## 2018-02-18 ENCOUNTER — Ambulatory Visit: Payer: Medicare HMO | Admitting: Adult Health

## 2018-02-18 VITALS — BP 126/63 | HR 72 | Ht 68.0 in | Wt 179.2 lb

## 2018-02-18 DIAGNOSIS — I10 Essential (primary) hypertension: Secondary | ICD-10-CM | POA: Diagnosis not present

## 2018-02-18 DIAGNOSIS — I2510 Atherosclerotic heart disease of native coronary artery without angina pectoris: Secondary | ICD-10-CM | POA: Diagnosis not present

## 2018-02-18 DIAGNOSIS — I480 Paroxysmal atrial fibrillation: Secondary | ICD-10-CM | POA: Diagnosis not present

## 2018-02-18 DIAGNOSIS — Z79899 Other long term (current) drug therapy: Secondary | ICD-10-CM

## 2018-02-18 DIAGNOSIS — I739 Peripheral vascular disease, unspecified: Secondary | ICD-10-CM

## 2018-02-18 LAB — CBC
HEMOGLOBIN: 12.6 g/dL — AB (ref 13.0–17.7)
Hematocrit: 40.1 % (ref 37.5–51.0)
MCH: 26.8 pg (ref 26.6–33.0)
MCHC: 31.4 g/dL — AB (ref 31.5–35.7)
MCV: 85 fL (ref 79–97)
Platelets: 208 10*3/uL (ref 150–450)
RBC: 4.71 x10E6/uL (ref 4.14–5.80)
RDW: 14.1 % (ref 12.3–15.4)
WBC: 12.6 10*3/uL — ABNORMAL HIGH (ref 3.4–10.8)

## 2018-02-18 LAB — BASIC METABOLIC PANEL
BUN / CREAT RATIO: 27 — AB (ref 10–24)
BUN: 29 mg/dL — ABNORMAL HIGH (ref 8–27)
CO2: 20 mmol/L (ref 20–29)
CREATININE: 1.09 mg/dL (ref 0.76–1.27)
Calcium: 9.6 mg/dL (ref 8.6–10.2)
Chloride: 104 mmol/L (ref 96–106)
GFR calc Af Amer: 74 mL/min/{1.73_m2} (ref >59)
GFR calc non Af Amer: 64 mL/min/{1.73_m2} (ref >59)
Glucose: 124 mg/dL — ABNORMAL HIGH (ref 65–99)
POTASSIUM: 4.6 mmol/L (ref 3.5–5.2)
SODIUM: 142 mmol/L (ref 134–144)

## 2018-02-18 MED ORDER — ISOSORBIDE MONONITRATE ER 30 MG PO TB24: 45.0000 mg | ORAL_TABLET | Freq: Every day | ORAL | 4 refills | Status: DC

## 2018-02-18 NOTE — Patient Instructions (Signed)
Labwork: CDIFF CBC BMET TODAY HERE IN OUR OFFICE AT [facility]  Take the provided lab slips with you to the lab for your blood draw.   Follow-Up: You will need a follow up appointment in [place].  You may see  DR Phineas Semen, DNP, ANP -or- one of the following Advanced Practice Providers on your designated Care Team:    . Kerin Ransom, [place]  Medication Instructions:  NO CHANGES- Your physician recommends that you continue on your current medications as directed. Please refer to the Current Medication list given to you today.  If you need a refill on your cardiac medications before your next appointment, please call your pharmacy.  Labwork: If you have labs (blood work) drawn today and your tests are completely normal, you will receive your results ONLY by: . MyChart Message (if you have MyChart) -OR- . A paper copy in the mail  At [facility], you and your health needs are our priority.  As part of our continuing mission to provide you with exceptional heart care, we have created designated Provider Care Teams.  These Care Teams include your primary Cardiologist (physician) and Advanced Practice Providers (APPs -  Physician Assistants and Nurse Practitioners) who all work together to provide you with the care you need, when you need it.  Thank you for choosing [facility] at [facility]!!

## 2018-02-19 LAB — CLOSTRIDIUM DIFFICILE BY PCR: CDIFFPCR: NEGATIVE

## 2018-02-20 ENCOUNTER — Encounter: Payer: Self-pay | Admitting: Cardiovascular Disease

## 2018-02-20 ENCOUNTER — Ambulatory Visit (INDEPENDENT_AMBULATORY_CARE_PROVIDER_SITE_OTHER): Payer: Medicare HMO | Admitting: Cardiovascular Disease

## 2018-02-20 VITALS — BP 112/60 | HR 73 | Ht 68.0 in | Wt 177.0 lb

## 2018-02-20 DIAGNOSIS — Z950 Presence of cardiac pacemaker: Secondary | ICD-10-CM

## 2018-02-20 DIAGNOSIS — I25710 Atherosclerosis of autologous vein coronary artery bypass graft(s) with unstable angina pectoris: Secondary | ICD-10-CM | POA: Diagnosis not present

## 2018-02-20 DIAGNOSIS — I480 Paroxysmal atrial fibrillation: Secondary | ICD-10-CM | POA: Insufficient documentation

## 2018-02-20 DIAGNOSIS — E1151 Type 2 diabetes mellitus with diabetic peripheral angiopathy without gangrene: Secondary | ICD-10-CM

## 2018-02-20 DIAGNOSIS — I10 Essential (primary) hypertension: Secondary | ICD-10-CM

## 2018-02-20 DIAGNOSIS — I495 Sick sinus syndrome: Secondary | ICD-10-CM

## 2018-02-20 DIAGNOSIS — E7800 Pure hypercholesterolemia, unspecified: Secondary | ICD-10-CM | POA: Insufficient documentation

## 2018-02-20 DIAGNOSIS — I739 Peripheral vascular disease, unspecified: Secondary | ICD-10-CM

## 2018-02-20 NOTE — Telephone Encounter (Signed)
Called patient to see if he was interested in participating in the Cardiac Rehab Program. Patient stated he was not at home at the time and will give [place] a call back later today.  I pt does not return call today, will follow up with pt in a week.

## 2018-02-20 NOTE — Progress Notes (Signed)
Cardiology Office Note:    Date:  02/20/2018   ID:  Robert Burns, DOB 1940-06-27, MRN [id number]  PCP:  [name], MD  Cardiologist:  Sanda Klein, MD (pacemaker) ; Ancil Linsey, MD  Referring MD: [name], MD   Chief complaint: Pacemaker follow-up  History of Present Illness:    Robert Burns is a 77 y.o. male with a hx of symptomatic sinus bradycardia who received a dual-chamber permanent pacemaker in 2007 with a generator change out in January 2017 (Medtronic Adapta).  Returning for follow-up.  He sees Dr. Quay Burow for coronary atherosclerosis with multiple revascularization procedures (bypass surgery 2004, drug-eluting stent to SVG-PDA and SVG to diagonal in 2016, drug-eluting stents x2 to LAD and in-stent angioplasty for restenosis SVG-diagonal and proximal limb of sequential SVG-OM 02/08/2018) and PAD (angioplasty left SFA 2004 and stent September 2017, right SFA November 2017).  He has normal left ventricular systolic function by echo last performed 02/07/2018, with "posterior basal hypokinesis".  He has biatrial dilation and right ventricular dilation by echo.  He presented with symptoms consistent with unstable angina on October 28.  He was admitted and underwent cardiac catheterization which showed multiple coronary stenoses involving both the native and graft vessels.  He was evaluated for redo bypass surgery but felt to be a poor candidate and subsequently underwent revascularization percutaneously by Dr. Irish Lack.  He received 2 drug-eluting stents to the LAD artery and had angioplasty to restenotic lesions previously placed stents to have his vein grafts (SVG-diagonal, proximal limb of sequential SVG-OM-PLV with distal occluded).  He does not have occlusion of the native right coronary artery and had several lesions in the saphenous vein graft to the RCA including an 80% distal stenosis.  Despite the extensive revascularization he still has occasional episodes  of chest discomfort with light activity.  They are very brief.  He has not required nitroglycerin.  Ever since he left the hospital he has been rather weak.  His family says he always looks pale.  He has had diarrhea with 4-5 almost liquid stools daily.  Stools are beginning to be more formed as of the last 24 hours.  He has not had fever.  Labs performed a couple of days ago showed a negative assay for C. difficile toxin, mildly elevated white blood cell count of about 12,000 (identical to admission WBC on 12/02), otherwise normal electrolytes and renal function.  His hemoglobin is down a little bit at 10.6, compared with 12.6 before his 2 cardiac catheterization procedures his blood pressure is low normal.  His weight is down 10- 12 pounds from his average weight this year, 7 pounds less than the day of discharge from the hospital.  As is typical for him, his pacemaker shows virtually 100% atrial paced rhythm with satisfactory heart rate histogram distribution and virtually no ventricular pacing (0.8%).  There have been no episodes of ventricular tachycardia.  Estimated battery longevity is around 10.5 years and lead parameters remain good.  His pacemaker has recorded a single episode of atrial fibrillation that occurred on November 01, 2017, lasting for 4-1/2 hours.  He had very mild rapid ventricular response during that episode.  This is the first ever episode of atrial fibrillation since he had his first pacemaker implanted in 2007.  It is definitely the first episode since the last generator change in 2017.  He does not have a history of stroke/TIA or other embolic events.  Past Medical History:  Diagnosis Date  . Arthritis    "  all over" (11/25/2015)  . CAD (coronary artery disease)    a. s/p CABG  06/2002; b. 02/01/15 PCI: DES to prox SVG to PDA, staged PCI of SVG to Diag in 02/2015; c. 04/2017 Cath/PCI: LM nl, LAD 100ost, 45m, 75d, LCX 60ost, OM2 80, RCA 100ost, RPDA 80, LIMA->LAD ok, VG->D1 patent  stent, VG->RPDA patent stent, 50p, VG->OM1->OM2 90p (3.0x24 Synergy DES), 100 between OM1->OM2 (med rx).  . Cancer ([place])    Right Shoulder, Left Leg- BCC, SCC, AND MELANOMA  . Hyperlipidemia   . Hypertension   . Leg pain   . OSA on CPAP   . PAD (peripheral artery disease) ([place])    a. 10/2002 L SFA PTA/BMS; b. 8/17 LE Angio: LEIA 90 (9x40 self exp stent), LSFA short segment prox occlusion (staged PTA/stenting 01/03/2016), patent mid stent, RSFA 72m (staged PTA/DEB 02/14/2016).  . Presence of permanent cardiac pacemaker   . SSS (sick sinus syndrome) ([place])    a. s/p PPM in 2007 with gen change 04/2015 - Medtronic Adapta ADDRL1, ser # [id number].  . Type II diabetes mellitus ([place])   . Urgency of urination     Past Surgical History:  Procedure Laterality Date  . CARDIAC CATHETERIZATION N/A 02/01/2015   Procedure: Left Heart Cath and Coronary Angiography;  Surgeon: Lorretta Harp, MD;  Location: [facility];  Service: Cardiovascular;  Laterality: N/A;  . CARDIAC CATHETERIZATION N/A 02/01/2015   Procedure: Coronary Stent Intervention;  Surgeon: Lorretta Harp, MD;  Location: [facility];  Service: Cardiovascular;  Laterality: N/A;  . CARDIAC CATHETERIZATION  06/2002   "just before bypass OR"  . CARDIAC CATHETERIZATION N/A 03/01/2015   Procedure: Coronary Stent Intervention;  Surgeon: Lorretta Harp, MD;  Location: [facility];  Service: Cardiovascular;  Laterality: N/A;  . CARDIAC CATHETERIZATION  02/06/2018  . CORONARY ANGIOPLASTY    . CORONARY ARTERY BYPASS GRAFT  06/2002   x5, LIMA-LAD;VG- Diag; seq VG- ramus & OM branch; VG-PDA  . CORONARY STENT INTERVENTION N/A 04/12/2017   Procedure: CORONARY STENT INTERVENTION;  Surgeon: Lorretta Harp, MD;  Location: [facility];  Service: Cardiovascular;  Laterality: N/A;  . CORONARY STENT INTERVENTION N/A 02/08/2018   Procedure: CORONARY STENT INTERVENTION;  Surgeon: Jettie Booze, MD;  Location: [facility];  Service: Cardiovascular;  Laterality: N/A;  . EP IMPLANTABLE DEVICE N/A 04/23/2015   Procedure: PPM Generator Changeout;  Surgeon: Sanda Klein, MD;  Location: [facility];  Service: Cardiovascular;  Laterality: N/A;  . FEMORAL ARTERY STENT Left ~ 2014   "taken out of my leg; couldn' catorgorize what kind so the put it under all 3"; cataroziepitheloid hemanioendotheliomau  . FOOT FRACTURE SURGERY Left 1973  . FRACTURE SURGERY    . INSERT / REPLACE / REMOVE PACEMAKER  10/13/05   right side, medtronic Adapta  . KNEE HARDWARE REMOVAL Right 1950's   "3-4 months after the insertion"  . KNEE SURGERY Right 1950's   "broke my lower leg; had to put pin in my knee to keep lower leg in place til it healed"  . LEFT HEART CATH AND CORS/GRAFTS ANGIOGRAPHY N/A 04/12/2017   Procedure: LEFT HEART CATH AND CORS/GRAFTS ANGIOGRAPHY;  Surgeon: Lorretta Harp, MD;  Location: [facility];  Service: Cardiovascular;  Laterality: N/A;  . LEFT HEART CATH AND CORS/GRAFTS ANGIOGRAPHY N/A 02/06/2018   Procedure: LEFT HEART CATH AND CORS/GRAFTS ANGIOGRAPHY;  Surgeon: Troy Sine, MD;  Location: [facility];  Service: Cardiovascular;  Laterality: N/A;  . PERIPHERAL VASCULAR CATHETERIZATION N/A 11/25/2015   Procedure: Lower Extremity Angiography;  Surgeon: Lorretta Harp, MD;  Location: [facility];  Service: Cardiovascular;  Laterality: N/A;  . PERIPHERAL VASCULAR CATHETERIZATION Left 11/25/2015   Procedure: Peripheral Vascular Intervention;  Surgeon: Lorretta Harp, MD;  Location: [facility];  Service: Cardiovascular;  Laterality: Left;  external iliac  . PERIPHERAL VASCULAR CATHETERIZATION N/A 01/03/2016   Procedure: Lower Extremity Angiography;  Surgeon: Lorretta Harp, MD;  Location: [facility];  Service: Cardiovascular;  Laterality: N/A;  . PERIPHERAL VASCULAR CATHETERIZATION Left 01/03/2016   Procedure: Peripheral Vascular Intervention;  Surgeon: Lorretta Harp, MD;  Location: [facility];  Service: Cardiovascular;  Laterality: Left;  SFA  . PERIPHERAL VASCULAR CATHETERIZATION Right 02/14/2016   Procedure: Peripheral Vascular Atherectomy;  Surgeon: Lorretta Harp, MD;  Location: [facility];  Service: Cardiovascular;  Laterality: Right;  SFA  . POPLITEAL ARTERY STENT  01/03/2016   Contralateral access with a 7 French crossover sheath (second order catheter placement)  . TONSILLECTOMY AND ADENOIDECTOMY    . TUMOR EXCISION Right ~ 2005   cancerous tumor removed from shoulder  . TUMOR EXCISION Right ~ 2000   benign tumor removed from under shoulder  . TUMOR EXCISION Left 06/02/2010   resection of Lt SFA wth interposition of Gore-Tex graft    Current Medications: Current Meds  Medication Sig  . amLODipine (NORVASC) 10 MG tablet Take 10 mg by mouth daily.   [name] aspirin 81 MG chewable tablet Chew 1 tablet (81 mg total) by mouth daily.  [name] atorvastatin (LIPITOR) 80 MG tablet Take 1 tablet (80 mg total) by mouth daily at 6 PM.  . Cholecalciferol (VITAMIN D3) 2000 units capsule Take 2,000 Units by mouth 2 (two) times daily.  . clopidogrel (PLAVIX) 75 MG tablet Take 1 tablet (75 mg total) by mouth daily with breakfast.  . glipiZIDE (GLUCOTROL) 5 MG tablet Take 5 mg by mouth every evening.  . hydrALAZINE (APRESOLINE) 25 MG tablet Take 25 mg by mouth 3 (three) times daily.  . hydrochlorothiazide (HYDRODIURIL) 25 MG tablet Take 25 mg by mouth every morning.  . isosorbide mononitrate (IMDUR) 30 MG 24 hr tablet Take 1.5 tablets (45 mg total) by mouth daily.  . metFORMIN (GLUCOPHAGE) 1000 MG tablet Take 1 tablet (1,000 mg total) by mouth 2 (two) times daily with a meal.  . metoprolol tartrate (LOPRESSOR) 100 MG tablet Take 1 tablet (100 mg total) by mouth 2 (two) times daily.  . potassium chloride SA (K-DUR,KLOR-CON) 20 MEQ tablet Take 1 tablet (20 mEq total) by mouth daily. (Patient taking differently: Take 20 mEq by mouth every evening. )    . rOPINIRole (REQUIP) 1 MG tablet Take 1 mg by mouth at bedtime.  . sodium chloride (OCEAN) 0.65 % SOLN nasal spray Place 1 spray into both nostrils as needed for congestion.   . tamsulosin (FLOMAX) 0.4 MG CAPS capsule Take 0.4 mg by mouth every evening.      Allergies:   Carvedilol; Peanut-containing drug products; Ace inhibitors; Diltiazem hcl; Duloxetine hcl; Meloxicam; and Clonidine hcl   Social History   Socioeconomic History  . Marital status: Married    Spouse name: Not on file  . Number of children: Not on file  . Years of education: Not on file  . Highest education level: Not on file  Occupational History  . Not on file  Social Needs  . Financial resource strain: Not on file  . Food insecurity:    Worry: Not on file    Inability: Not on file  . Transportation needs:    Medical: Not on file    Non-medical: Not on file  Tobacco Use  . Smoking status: Former Smoker    Years: 10.00    Types: Pipe    Last attempt to quit: 1973    Years since quitting: 46.8  . Smokeless tobacco: Never Used  . Tobacco comment: "quit smoking in 1973"  Substance and Sexual Activity  . Alcohol use: No  . Drug use: No  . Sexual activity: Not Currently  Lifestyle  . Physical activity:    Days per week: Not on file    Minutes per session: Not on file  . Stress: Not on file  Relationships  . Social connections:    Talks on phone: Not on file    Gets together: Not on file    Attends religious service: Not on file    Active member of club or organization: Not on file    Attends meetings of clubs or organizations: Not on file    Relationship status: Not on file  Other Topics Concern  . Not on file  Social History Narrative  . Not on file     Family History: The patient's family history includes Coronary artery disease in his mother; Diabetes in his father and sister; Heart attack in his father and mother; Heart disease in his father, mother, and sister; Hyperlipidemia in his father;  Hypertension in his mother and sister. ROS:   Please see the history of present illness.    All other systems reviewed and are negative.  EKGs/Labs/Other Studies Reviewed:    EKG:  EKG is ordered today.  The intracardiac electrogram shows atrial paced, ventricular sensed rhythm  Recent Labs: 03/28/2017: TSH 0.964 02/18/2018: BUN 29; Creatinine, Ser 1.09; Hemoglobin 12.6; Platelets 208; Potassium 4.6; Sodium 142  Recent Lipid Panel Labs from [facility] physicians November 2017 show LDL 43, HDL 29  Physical Exam:    VS:  BP 112/60   Pulse 73   Ht 5\' 8"  (1.727 m)   Wt 177 lb (80.3 kg)   BMI 26.91 kg/m     Wt Readings from Last 3 Encounters:  02/20/18 177 lb (80.3 kg)  02/18/18 179 lb 3.2 oz (81.3 kg)  02/09/18 184 lb 6.4 oz (83.6 kg)    General: Alert, oriented x3, no distress, he does look slightly pale and a little weak Head: no evidence of trauma, PERRL, EOMI, no exophtalmos or lid lag, no myxedema, no xanthelasma; normal ears, nose and oropharynx Neck: normal jugular venous pulsations and no hepatojugular reflux; brisk carotid pulses without delay and no carotid bruits Chest: clear to auscultation, no signs of consolidation by percussion or palpation, normal fremitus, symmetrical and full respiratory excursions.  Healthy left subclavian pacemaker site Cardiovascular: normal position and quality of the apical impulse, regular rhythm, normal first and second heart sounds, no murmurs, rubs or gallops Abdomen: no tenderness or distention, no masses by palpation, no abnormal pulsatility or arterial bruits, normal bowel sounds, no hepatosplenomegaly Extremities: no clubbing, cyanosis or edema; 2+ radial, ulnar and brachial pulses bilaterally; 2+ right femoral, posterior tibial and dorsalis pedis pulses; 2+ left femoral, posterior tibial and dorsalis pedis pulses; no subclavian or femoral bruits Neurological: grossly nonfocal Psych: Normal mood and affect   ASSESSMENT:    1. Coronary  artery disease involving autologous  vein coronary bypass graft with unstable angina pectoris (HCC)   2. Paroxysmal atrial fibrillation ([place])   3. SSS (sick sinus syndrome) ([place])   4. Pacemaker   5. PAD (peripheral artery disease) ([place])   6. Hypercholesterolemia   7. Essential hypertension   8. Diabetes mellitus with peripheral vascular disease ([place])    PLAN:    In order of problems listed above:  1. CAD: Recently presented with worsening angina pectoris and had complex percutaneous revascularization involving the stenosis in the native LAD and 2 vein grafts with restenosis.  He still has a stenosis in the SVG to RCA and the native vessels occluded.  He has occasional mild chest discomfort.  It is not clear to me from the report whether the SVG-RCA was not intervened upon because it was a poor target or because of the volume of contrast employed or the other procedures.  We will ask Dr. Gwenlyn Found to review the angiograms and decide whether or not the SVG-RCA also deserves percutaneous revascularization.  I did not make any changes to his antianginal medications due to his low blood pressure at this time.  He is already taking amlodipine, metoprolol nitrates. 2. Parox AFib: Asymptomatic, detected by his pacemaker.  The episode was lengthy, 4 hours in duration and associated mild RVR.  It occurred about 4 months ago and no new episodes since.  Since he is committed to dual antiplatelet therapy for 2 recently placed drug-eluting stents, I elected to not to start anticoagulation today.  Will use his pacemaker to monitor for recurrent arrhythmia.  He should promptly report any focal neurological events, even if very transient.  After he can stop 1 of the components of dual antiplatelet therapy, we should revisit the issue of chronic oral anticoagulants. 3. SSS: No symptoms to suggest chronotropic incompetence despite virtually 100% atrial pacing.  The satisfactory sensor settings. 4. PPM: Normal device  function, continue remote downloads every 3 months and yearly office visit 5. PAD: Has mild intermittent claudication, not lifestyle limiting.  He was scheduled for repeat duplex ultrasonography in June 2019, but I do not think the studies were ever performed. 6. HLP: Excellent LDL cholesterol at last check, recent labs are not available.  Target LDL should be well under 70.  On statin. 7. HTN: His blood pressure is relatively low and he feels weak.  His weight is 7 pounds lower than his discharge weight from the hospital, probably at least 10 pounds less than his baseline and he is having diarrhea.  I told him to stop taking hydrochlorothiazide until the diarrhea resolves completely.  C. difficile study was negative.  He did not receive antibiotics during his hospitalization. 8. DM: Patient reports good glycemic control .  Recent A1c is not available.   Medication Adjustments/Labs and Tests Ordered: Current medicines are reviewed at length with the patient today.  Concerns regarding medicines are outlined above.  No orders of the defined types were placed in this encounter.  No orders of the defined types were placed in this encounter.   Signed, Sanda Klein, MD  02/20/2018 3:30 PM    [place]

## 2018-02-20 NOTE — Patient Instructions (Addendum)
Medication Instructions:  Dr Sallyanne Kuster has recommended making the following medication changes: 1. HOLD Hydrochlorothiazide for 1 week - when diarrhea stops, RESTART medication  If you need a refill on your cardiac medications before your next appointment, please call your pharmacy.   Testing/Procedures: Remote monitoring is used to monitor your Pacemaker of ICD from home. This monitoring reduces the number of office visits required to check your device to one time per year. It allows [place] to keep an eye on the functioning of your device to ensure it is working properly. You are scheduled for a device check from home on Tuesday, December 31st, 2019. You may send your transmission at any time that day. If you have a wireless device, the transmission will be sent automatically. After your physician reviews your transmission, you will receive a postcard with your next transmission date.  Follow-Up:  Dr Sallyanne Kuster recommends that you schedule a follow-up appointment with Dr Gwenlyn Found - MD ONLY.  At [facility], you and your health needs are our priority.  As part of our continuing mission to provide you with exceptional heart care, we have created designated Provider Care Teams.  These Care Teams include your primary Cardiologist (physician) and Advanced Practice Providers (APPs -  Physician Assistants and Nurse Practitioners) who all work together to provide you with the care you need, when you need it. You will need a follow up appointment in 12 months.  Please call our office 2 months in advance to schedule this appointment.  You may see Sanda Klein, MD or one of the following Advanced Practice Providers on your designated Care Team: [place], [place] . Fabian Sharp, PA-C

## 2018-02-26 LAB — CUP PACEART INCLINIC DEVICE CHECK
Battery Remaining Longevity: 127 mo
Battery Voltage: 2.8 V
Date Time Interrogation Session: 20191113163143
Implantable Lead Implant Date: 20070706
Implantable Lead Location: 753859
Implantable Lead Model: 5092
Implantable Pulse Generator Implant Date: 20170113
Lead Channel Impedance Value: 481 Ohm
Lead Channel Pacing Threshold Amplitude: 1 V
Lead Channel Pacing Threshold Amplitude: 1 V
Lead Channel Pacing Threshold Amplitude: 1.625 V
Lead Channel Pacing Threshold Pulse Width: 0.4 ms
Lead Channel Pacing Threshold Pulse Width: 0.4 ms
Lead Channel Setting Pacing Amplitude: 3 V
MDC IDC LEAD IMPLANT DT: 20070706
MDC IDC LEAD LOCATION: 753860
MDC IDC MSMT BATTERY IMPEDANCE: 185 Ohm
MDC IDC MSMT LEADCHNL RA PACING THRESHOLD PULSEWIDTH: 0.4 ms
MDC IDC MSMT LEADCHNL RV IMPEDANCE VALUE: 639 Ohm
MDC IDC MSMT LEADCHNL RV PACING THRESHOLD AMPLITUDE: 1.5 V
MDC IDC MSMT LEADCHNL RV PACING THRESHOLD PULSEWIDTH: 0.4 ms
MDC IDC MSMT LEADCHNL RV SENSING INTR AMPL: 15.67 mV
MDC IDC SET LEADCHNL RA PACING AMPLITUDE: 2 V
MDC IDC SET LEADCHNL RV PACING PULSEWIDTH: 0.4 ms
MDC IDC SET LEADCHNL RV SENSING SENSITIVITY: 5.6 mV
MDC IDC STAT BRADY AP VP PERCENT: 1 %
MDC IDC STAT BRADY AP VS PERCENT: 98 %
MDC IDC STAT BRADY AS VP PERCENT: 0 %
MDC IDC STAT BRADY AS VS PERCENT: 1 %

## 2018-02-27 ENCOUNTER — Other Ambulatory Visit: Payer: Self-pay | Admitting: Cardiovascular Disease

## 2018-02-27 ENCOUNTER — Telehealth (HOSPITAL_COMMUNITY): Payer: Self-pay

## 2018-02-27 DIAGNOSIS — Z95820 Peripheral vascular angioplasty status with implants and grafts: Secondary | ICD-10-CM

## 2018-02-27 DIAGNOSIS — I739 Peripheral vascular disease, unspecified: Secondary | ICD-10-CM

## 2018-02-27 NOTE — Telephone Encounter (Signed)
Attempted to contact patient in regards to Cardiac Rehab - lm on vm °

## 2018-02-28 ENCOUNTER — Ambulatory Visit (HOSPITAL_COMMUNITY)
Admission: RE | Admit: 2018-02-28 | Discharge: 2018-02-28 | Disposition: A | Discharge status: Home / Self Care | Payer: Medicare HMO | Source: Ambulatory Visit | Attending: Cardiovascular Disease | Admitting: Cardiovascular Disease

## 2018-02-28 ENCOUNTER — Ambulatory Visit (HOSPITAL_BASED_OUTPATIENT_CLINIC_OR_DEPARTMENT_OTHER)
Admission: RE | Admit: 2018-02-28 | Discharge: 2018-02-28 | Disposition: A | Discharge status: Home / Self Care | Payer: Medicare HMO | Source: Ambulatory Visit | Attending: Cardiovascular Disease | Admitting: Cardiovascular Disease

## 2018-02-28 DIAGNOSIS — Z95820 Peripheral vascular angioplasty status with implants and grafts: Secondary | ICD-10-CM

## 2018-02-28 DIAGNOSIS — I739 Peripheral vascular disease, unspecified: Secondary | ICD-10-CM | POA: Diagnosis not present

## 2018-03-05 ENCOUNTER — Other Ambulatory Visit: Payer: Self-pay

## 2018-03-05 ENCOUNTER — Ambulatory Visit: Payer: Medicare HMO | Admitting: Cardiovascular Disease

## 2018-03-05 ENCOUNTER — Encounter: Payer: Self-pay | Admitting: Cardiovascular Disease

## 2018-03-05 ENCOUNTER — Encounter

## 2018-03-05 VITALS — BP 146/76 | HR 94 | Ht 68.0 in | Wt 177.0 lb

## 2018-03-05 DIAGNOSIS — I739 Peripheral vascular disease, unspecified: Secondary | ICD-10-CM | POA: Diagnosis not present

## 2018-03-05 DIAGNOSIS — E1169 Type 2 diabetes mellitus with other specified complication: Secondary | ICD-10-CM

## 2018-03-05 DIAGNOSIS — I2510 Atherosclerotic heart disease of native coronary artery without angina pectoris: Secondary | ICD-10-CM

## 2018-03-05 DIAGNOSIS — G4733 Obstructive sleep apnea (adult) (pediatric): Secondary | ICD-10-CM

## 2018-03-05 DIAGNOSIS — Z9989 Dependence on other enabling machines and devices: Secondary | ICD-10-CM

## 2018-03-05 DIAGNOSIS — E785 Hyperlipidemia, unspecified: Secondary | ICD-10-CM

## 2018-03-05 NOTE — Patient Instructions (Addendum)
Medication Instructions:  NONE If you need a refill on your cardiac medications before your next appointment, please call your pharmacy.   Lab work: NONE If you have labs (blood work) drawn today and your tests are completely normal, you will receive your results only by: [name] MyChart Message (if you have MyChart) OR . A paper copy in the mail If you have any lab test that is abnormal or we need to change your treatment, we will call you to review the results.  Testing/Procedures: Your physician has requested that you have a lower extremity arterial duplex. This test is an ultrasound of the arteries in the legs or arms. It looks at arterial blood flow in the legs and arms. Allow one hour for Lower and Upper Arterial scans. There are no restrictions or special instructions SCHEDULE IN 6 MONTHS  Your physician has requested that you have an abdominal aorta duplex. During this test, an ultrasound is used to evaluate the aorta. Allow 30 minutes for this exam. Do not eat after midnight the day before and avoid carbonated beverages. SCHEDULE IN 6 MONTHS   Follow-Up: At [facility], you and your health needs are our priority.  As part of our continuing mission to provide you with exceptional heart care, we have created designated Provider Care Teams.  These Care Teams include your primary Cardiologist (physician) and Advanced Practice Providers (APPs -  Physician Assistants and Nurse Practitioners) who all work together to provide you with the care you need, when you need it. You will need a follow up appointment in [place], NP AND DR.BERRY IN 6 MONTHS.

## 2018-03-05 NOTE — Assessment & Plan Note (Signed)
3 of essential hypertension her blood pressure measured today at one 146/76.  He is on amlodipine, hydrochlorothiazide, hydralazine and metoprolol.

## 2018-03-05 NOTE — Assessment & Plan Note (Signed)
History of peripheral arterial disease status post multiple interventions on both lower extremities including stenting of his left external iliac artery and diamondback orbital rotational atherectomy, drug-eluting balloon angioplasty of his right SFA 02/14/2016.  Recent Dopplers performed 02/28/2018 revealed a right ABI of 0.82 and a left of 1.05 with a high-frequency signal in his mid right SFA although he denies claudication.

## 2018-03-05 NOTE — Assessment & Plan Note (Signed)
History of obstructive sleep apnea on CPAP. 

## 2018-03-05 NOTE — Assessment & Plan Note (Signed)
History of sick sinus syndrome status post permanent transvenous pacemaker implantation followed by Dr. Sallyanne Kuster

## 2018-03-05 NOTE — Progress Notes (Signed)
03/05/2018 Robert Burns   Sep 24, 1940  [id number]  Primary Physician [name], MD Primary Cardiologist: Lorretta Harp MD [name], [name]  HPI:  Robert Burns is a 77 y.o.  married Caucasian male, father of 2, grandfather to 3 grandchildren whose wife Robert Burns who is also a patient of mine. I last saw him in the office 03/28/2017.[name] He has a history of CAD status post coronary artery bypass grafting March 2004 with a LIMA to his LAD, a vein to a diagonal branch, a sequential vein to a ramus and OM branch, as well as a vein to the PDA. Last functional study performed July 28, 2011, was entirely normal. 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 He had a pacemaker placed for sick sinus syndrome November 2008 followed by Dr. Sallyanne Kuster. He has obstructive sleep apnea on CPAP. He complain of left thigh pain and I angiogram'd him revealing patent arteries though he did have a space-occupying lesion which was removed by Dr. Kellie Simmering with placement of an interposition 6-mm Gore-Tex graft. The pathology was uncertain. 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 Dr. Baxter Flattery follows his lipid profile.Since I saw him back 11/07/12 he has done well. Followup Dopplers performed in our office 09/27/12 revealed a high-grade lesion in the distal right SFA with an ABI of 0.3. His left ABI 1.1 without obstructive disease. His most recent lower extremity Doppler Dopplers performed 9/32/16 revealed a right ABI 0.82 And a left ABI of 0.89. Over the last 3 months he's noticed anginal chest pain occurring both at rest and with exertion with left upper extremity radiation. He also complains of left lower extremity discomfort. to moderate anterolateral ischemia. Because of ongoing chest pain and a Myoview that showed anterolateral ischemia and he underwent cardiac catheterization on 02/01/15 revealing high grade segmental proximal right SVG disease and subtotally occluded diagonal  branch SVG. He underwent stenting of his RCA SVG successfully. He does have continued chest pain although somewhat improved since his last procedure. I brought him back for staged diagonal branch SVG intervention on 03/01/15. This was successful and since that time he denies chest pain or shortness of breath. He underwent a generator change by Dr. Sallyanne Kuster in January for end-of-life of his Medtronic pacemaker.Since I saw him 6 months ago he developed new left calf claudication of the last 3 months. Lower extremity arterial Doppler studies performed today revealed a decline in his left ABI from 0.87 down to 0.59 with an occluded left SFA that is a new finding. 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 He had a short segment occlusion of the proximal left SFA with patent mid left SFA stent. He had 90% segmental calcified mid right SFA stenosis with three-vessel runoff bilaterally. I ended up stenting his left external iliac artery with a 9 mm x 40 mm long nitinol self-expanding stent which improved his symptoms somewhat although he continuedto have lifestyle limiting claudication. He underwent staged intervention of his left SFA on 01/03/16. I stented the entire quadrant totally occluded segment with a 6 mm x 250 mm long Viabahn Stent. His claudication on the left has resolved and his ABIs normalized. He now complains of right leg medication. I did demonstrate a 90% calcified segmental mid right SFA stenosis at the time of angiography 11/25/15. He underwent diamondback orbital rotation atherectomy/drug-eluting  angioplasty of his mid calcified right SFA by myself 02/14/16. This follow-up Dopplers performed 02/24/16 revealed normal ABIs bilaterally. His claudication has improved. When I saw him in the office possibly 3 weeks ago he was complaining of fairly new  onset substernal chest pain over the prior several months occurring several times a week. These were new since his RCA and diagonal branch vein graft interventions at the end of 2016. He is on good antianginal medications. A Myoview stress test was ordered that showed subtle inferolateral ischemia and echo showed normal LV function.[name]  He was admitted to the hospital 02/06/2018 for 3 days with unstable angina.  He underwent catheterization by Dr. Claiborne Billings on 02/06/2018 revealing disease in all 3 vein grafts as well as in the LAD beyond LIMA insertion, 2 days later he underwent complex intervention on his diagonal and ramus branch vein grafts with restenting as well as intervention on his LAD via LIMA insertion.  His RCA vein graft was not intervened on.  He currently denies angina or claudication.  Current Meds  Medication Sig  . amLODipine (NORVASC) 10 MG tablet Take 10 mg by mouth daily.   [name] aspirin 81 MG chewable tablet Chew 1 tablet (81 mg total) by mouth daily.  [name] atorvastatin (LIPITOR) 80 MG tablet Take 1 tablet (80 mg total) by mouth daily at 6 PM.  . Cholecalciferol (VITAMIN D3) 2000 units capsule Take 2,000 Units by mouth 2 (two) times daily.  . clopidogrel (PLAVIX) 75 MG tablet Take 1 tablet (75 mg total) by mouth daily with breakfast.  . glipiZIDE (GLUCOTROL) 5 MG tablet Take 5 mg by mouth every evening.  . hydrALAZINE (APRESOLINE) 25 MG tablet Take 25 mg by mouth 3 (three) times daily.  . hydrochlorothiazide (HYDRODIURIL) 25 MG tablet Take 25 mg by mouth every morning.  . isosorbide mononitrate (IMDUR) 30 MG 24 hr tablet Take 1.5 tablets (45 mg total) by mouth daily.  . metFORMIN (GLUCOPHAGE) 1000 MG tablet Take 1 tablet (1,000 mg total) by mouth 2 (two) times daily with a meal.  . metoprolol tartrate (LOPRESSOR) 100 MG tablet Take 1 tablet (100 mg total) by mouth 2 (two) times daily.  . nitroGLYCERIN (NITROSTAT) 0.4 MG SL tablet Place 1 tablet (0.4 mg total) under the tongue every 5 (five)  minutes as needed for chest pain.  . potassium chloride SA (K-DUR,KLOR-CON) 20 MEQ tablet Take 1 tablet (20 mEq total) by mouth daily. (Patient taking differently: Take 20 mEq by mouth every evening. )  . rOPINIRole (REQUIP) 1 MG tablet Take 1 mg by mouth at bedtime.  . sodium chloride (OCEAN) 0.65 % SOLN nasal spray Place 1 spray into both nostrils as needed for congestion.   . tamsulosin (FLOMAX) 0.4 MG CAPS capsule Take 0.4 mg by mouth every evening.      Allergies  Allergen Reactions  . Carvedilol Itching  . Peanut-Containing Drug Products Anaphylaxis and Other (See Comments)    Tongue swelling is severe Other reaction(s): Unknown  . Ace Inhibitors Other (See Comments), Rash and Hives    Cough Other reaction(s): Unknown Cough   . Diltiazem Hcl Other (See Comments)    Unknown Other reaction(s): Other (See Comments), Unknown Unknown   . Duloxetine Hcl     Other reaction(s): Other Urinary frequency  . Meloxicam Other (See Comments)    GI upset Other reaction(s): Other (See Comments), Unknown GI upset GI upset GI BLEED   . Clonidine Hcl Other (See Comments)    Patch only - skin  irritation    Social History   Socioeconomic History  . Marital status: Married    Spouse name: Not on file  . Number of children: Not on file  . Years of education: Not on file  . Highest education level: Not on file  Occupational History  . Not on file  Social Needs  . Financial resource strain: Not on file  . Food insecurity:    Worry: Not on file    Inability: Not on file  . Transportation needs:    Medical: Not on file    Non-medical: Not on file  Tobacco Use  . Smoking status: Former Smoker    Years: 10.00    Types: Pipe    Last attempt to quit: 1973    Years since quitting: 46.9  . Smokeless tobacco: Never Used  . Tobacco comment: "quit smoking in 1973"  Substance and Sexual Activity  . Alcohol use: No  . Drug use: No  . Sexual activity: Not Currently  Lifestyle  .  Physical activity:    Days per week: Not on file    Minutes per session: Not on file  . Stress: Not on file  Relationships  . Social connections:    Talks on phone: Not on file    Gets together: Not on file    Attends religious service: Not on file    Active member of club or organization: Not on file    Attends meetings of clubs or organizations: Not on file    Relationship status: Not on file  . Intimate partner violence:    Fear of current or ex partner: Not on file    Emotionally abused: Not on file    Physically abused: Not on file    Forced sexual activity: Not on file  Other Topics Concern  . Not on file  Social History Narrative  . Not on file     Review of Systems: General: negative for chills, fever, night sweats or weight changes.  Cardiovascular: negative for chest pain, dyspnea on exertion, edema, orthopnea, palpitations, paroxysmal nocturnal dyspnea or shortness of breath Dermatological: negative for rash Respiratory: negative for cough or wheezing Urologic: negative for hematuria Abdominal: negative for nausea, vomiting, diarrhea, bright red blood per rectum, melena, or hematemesis Neurologic: negative for visual changes, syncope, or dizziness All other systems reviewed and are otherwise negative except as noted above.    Blood pressure (!) 146/76, pulse 94, height 5\' 8"  (1.727 m), weight 177 lb (80.3 kg), SpO2 97 %.  General appearance: alert and no distress Neck: no adenopathy, no carotid bruit, no JVD, supple, symmetrical, trachea midline and thyroid not enlarged, symmetric, no tenderness/mass/nodules Lungs: clear to auscultation bilaterally Heart: regular rate and rhythm, S1, S2 normal, no murmur, click, rub or gallop Extremities: extremities normal, atraumatic, no cyanosis or edema Pulses: 2+ and symmetric Skin: Skin color, texture, turgor normal. No rashes or lesions Neurologic: Alert and oriented X 3, normal strength and tone. Normal symmetric  reflexes. Normal coordination and gait  EKG not performed today  ASSESSMENT AND PLAN:   Essential hypertension 3 of essential hypertension her blood pressure measured today at one 146/76.  He is on amlodipine, hydrochlorothiazide, hydralazine and metoprolol.  SSS (sick sinus syndrome), medtronic adapta History of sick sinus syndrome status post permanent transvenous pacemaker implantation followed by Dr. Sallyanne Kuster  Hyperlipidemia due to type 2 diabetes mellitus ([place]) History of hyperlipidemia on statin therapy  OSA on CPAP History of obstructive sleep apnea on CPAP  Peripheral arterial disease ([place]) History of peripheral arterial disease status post multiple interventions on both lower extremities including stenting of his left external iliac artery and diamondback orbital rotational atherectomy, drug-eluting balloon angioplasty of his right SFA 02/14/2016.  Recent Dopplers performed 02/28/2018 revealed a right ABI of 0.82 and a left of 1.05 with a high-frequency signal in his mid right SFA although he denies claudication.  CAD, multiple vessel History of CAD status post coronary bypass grafting March 2004 with a LIMA to his LAD, vein to diagonal branch, sequential vein to ramus branch and obtuse marginal branch as well as a vein to the PDA.  He recently had cath performed by Dr. Claiborne Billings during a hospitalization for unstable angina 02/06/2018 revealing disease in all of his vein grafts as well as distal LAD disease beyond the LIMA insertion.  2 days later he underwent intervention by Dr. Irish Lack with stenting to his diagonal branch, and ramus branch vein grafts with what appears to be occlusion of his diagonal branch vein graft secondary to distal normalization.  He also had intervention on his mid and distal LAD via LIMA insertion.  His RCA vein graft was not intervened on at that time.  He really denies angina currently.      Lorretta Harp MD FACP,FACC,FAHA, [facility] 03/05/2018 2:26  PM

## 2018-03-05 NOTE — Assessment & Plan Note (Signed)
History of CAD status post coronary bypass grafting March 2004 with a LIMA to his LAD, vein to diagonal branch, sequential vein to ramus branch and obtuse marginal branch as well as a vein to the PDA.  He recently had cath performed by Dr. Claiborne Billings during a hospitalization for unstable angina 02/06/2018 revealing disease in all of his vein grafts as well as distal LAD disease beyond the LIMA insertion.  2 days later he underwent intervention by Dr. Irish Lack with stenting to his diagonal branch, and ramus branch vein grafts with what appears to be occlusion of his diagonal branch vein graft secondary to distal normalization.  He also had intervention on his mid and distal LAD via LIMA insertion.  His RCA vein graft was not intervened on at that time.  He really denies angina currently.

## 2018-03-05 NOTE — Assessment & Plan Note (Signed)
History of hyperlipidemia on statin therapy. 

## 2018-03-15 ENCOUNTER — Encounter (HOSPITAL_COMMUNITY): Payer: Self-pay

## 2018-03-15 ENCOUNTER — Telehealth (HOSPITAL_COMMUNITY): Payer: Self-pay

## 2018-03-15 NOTE — Telephone Encounter (Signed)
Attempted to call pt a 2nd time- LM ON VM ° °Mailed letter out °

## 2018-03-26 NOTE — Telephone Encounter (Signed)
Called pt to see if he would like to proceed with scheduling for CR, pt stated not at this time. ° °Closed referral °

## 2018-04-09 ENCOUNTER — Telehealth: Payer: Self-pay | Admitting: Cardiovascular Disease

## 2018-04-09 ENCOUNTER — Ambulatory Visit (INDEPENDENT_AMBULATORY_CARE_PROVIDER_SITE_OTHER): Payer: Medicare HMO

## 2018-04-09 DIAGNOSIS — I495 Sick sinus syndrome: Secondary | ICD-10-CM | POA: Diagnosis not present

## 2018-04-09 LAB — CUP PACEART REMOTE DEVICE CHECK
Battery Impedance: 210 Ohm
Brady Statistic AP VP Percent: 2 %
Brady Statistic AP VS Percent: 98 %
Brady Statistic AS VP Percent: 0 %
Brady Statistic AS VS Percent: 0 %
Implantable Lead Implant Date: 20070706
Lead Channel Impedance Value: 453 Ohm
Lead Channel Impedance Value: 697 Ohm
Lead Channel Pacing Threshold Amplitude: 1 V
Lead Channel Pacing Threshold Amplitude: 1.625 V
Lead Channel Pacing Threshold Pulse Width: 0.4 ms
Lead Channel Setting Pacing Amplitude: 3.25 V
Lead Channel Setting Sensing Sensitivity: 5.6 mV
MDC IDC LEAD IMPLANT DT: 20070706
MDC IDC LEAD LOCATION: 753859
MDC IDC LEAD LOCATION: 753860
MDC IDC MSMT BATTERY REMAINING LONGEVITY: 122 mo
MDC IDC MSMT BATTERY VOLTAGE: 2.79 V
MDC IDC MSMT LEADCHNL RV PACING THRESHOLD PULSEWIDTH: 0.4 ms
MDC IDC PG IMPLANT DT: 20170113
MDC IDC SESS DTM: 20191231221629
MDC IDC SET LEADCHNL RA PACING AMPLITUDE: 2 V
MDC IDC SET LEADCHNL RV PACING PULSEWIDTH: 0.4 ms

## 2018-04-09 NOTE — Telephone Encounter (Signed)
Spoke with patient to remind of missed remote transmission 

## 2018-04-11 NOTE — Progress Notes (Signed)
Remote pacemaker transmission.   

## 2018-05-08 ENCOUNTER — Ambulatory Visit: Payer: Medicare HMO | Admitting: Cardiovascular Disease

## 2018-05-08 ENCOUNTER — Encounter: Payer: Self-pay | Admitting: Cardiovascular Disease

## 2018-05-08 DIAGNOSIS — E1169 Type 2 diabetes mellitus with other specified complication: Secondary | ICD-10-CM

## 2018-05-08 DIAGNOSIS — I10 Essential (primary) hypertension: Secondary | ICD-10-CM

## 2018-05-08 DIAGNOSIS — I495 Sick sinus syndrome: Secondary | ICD-10-CM

## 2018-05-08 DIAGNOSIS — I739 Peripheral vascular disease, unspecified: Secondary | ICD-10-CM | POA: Diagnosis not present

## 2018-05-08 DIAGNOSIS — E785 Hyperlipidemia, unspecified: Secondary | ICD-10-CM

## 2018-05-08 NOTE — Assessment & Plan Note (Signed)
History of sick sinus syndrome status post permanent transvenous pacemaker insertion 10/13/2005 followed by Dr. Sallyanne Kuster.

## 2018-05-08 NOTE — Assessment & Plan Note (Signed)
History of hyperlipidemia on statin therapy with lipid profile performed 04/16/2018 revealing a total cholesterol 78, LDL 33 and HDL 31.

## 2018-05-08 NOTE — Patient Instructions (Signed)
Medication Instructions:  NONE If you need a refill on your cardiac medications before your next appointment, please call your pharmacy.   Lab work: NONE If you have labs (blood work) drawn today and your tests are completely normal, you will receive your results only by: [name] MyChart Message (if you have MyChart) OR . A paper copy in the mail If you have any lab test that is abnormal or we need to change your treatment, we will call you to review the results.  Testing/Procedures: Your physician has requested that you have a lower or upper extremity arterial duplex. This test is an ultrasound of the arteries in the legs or arms. It looks at arterial blood flow in the legs and arms. Allow one hour for Lower and Upper Arterial scans. There are no restrictions or special instructions  SCHEDULE November 2020  Your physician has requested that you have an ankle brachial index (ABI). During this test an ultrasound and blood pressure cuff are used to evaluate the arteries that supply the arms and legs with blood. Allow thirty minutes for this exam. There are no restrictions or special instructions. SCHEDULE November 2020  Follow-Up: At [facility], you and your health needs are our priority.  As part of our continuing mission to provide you with exceptional heart care, we have created designated Provider Care Teams.  These Care Teams include your primary Cardiologist (physician) and Advanced Practice Providers (APPs -  Physician Assistants and Nurse Practitioners) who all work together to provide you with the care you need, when you need it. . You will need a follow up appointment in 6 months WITH AN APP AND 12 MONTHS WITH DR. Gwenlyn Found.  Please call our office 2 months in advance to schedule this appointment.  You may see Dr. Gwenlyn Found or one of the following Advanced Practice Providers on your designated Care Team:   . Kerin Ransom, [place] . Almyra Deforest, PA-C . Fabian Sharp, PA-C . Jory Sims,  DNP . Rosaria Ferries, PA-C . Roby Lofts, PA-C . Sande Rives, PA-C  Any Other Special Instructions Will Be Listed Below (If Applicable).

## 2018-05-08 NOTE — Assessment & Plan Note (Signed)
History of essential hypertension with blood pressure measured today at 138/76.  He is on amlodipine hydralazine, HydroDIURIL and metoprolol.  Continue current meds at current dosing.

## 2018-05-08 NOTE — Assessment & Plan Note (Signed)
History of peripheral arterial disease status post multiple lower extremity interventions both percutaneously and surgically.  He was angiogramed 11/25/2015 with demonstration of a 90% distal left external iliac artery stenosis just proximal to a previously placed interposition graft with a short occlusion of the proximal left SFA and patent mid left SFA stent.  He also had a 90% calcified mid right SFA stenosis with three-vessel runoff.  I ultimately ended up stenting the left external iliac artery with a nitinol self-expanding stent and he underwent staged left SFA intervention 01/03/2016.  He underwent Dynabac orbital rotational atherectomy and drug-eluting balloon angioplasty of a calcified right SFA by myself 02/14/2016.  He does complain of bilateral lower extremity claudication with recent Dopplers performed 02/28/2018 revealing a right ABI 0.82 and a left of 1.05 with a high-frequency signal in the mid right SFA.

## 2018-05-08 NOTE — Progress Notes (Signed)
05/08/2018 Renn Dirocco Hooton   [birth year]  [id number]  Primary Physician [name], MD Primary Cardiologist: Lorretta Harp MD [name], [name]  HPI:  Robert Burns is a 78 y.o.  married Caucasian male, father of 2, grandfather to 3 grandchildren whose wife [name] who is also a patient of mine. I last saw him in theoffice  03/05/2018.[name] He has a history of CAD status post coronary artery bypass grafting March 2004 with a LIMA to his LAD, a vein to a diagonal branch, a sequential vein to a ramus and OM branch, as well as a vein to the PDA. Last functional study performed July 28, 2011, was entirely normal. 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 He had a pacemaker placed for sick sinus syndrome November 2008 followed by Dr. Sallyanne Kuster. He has obstructive sleep apnea on CPAP. He complain of left thigh pain and I angiogram'd him revealing patent arteries though he did have a space-occupying lesion which was removed by Dr. Kellie Simmering with placement of an interposition 6-mm Gore-Tex graft. The pathology was uncertain. 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 Dr. Baxter Flattery follows his lipid profile.Since I saw him back 11/07/12 he has done well. Followup Dopplers performed in our office 09/27/12 revealed a high-grade lesion in the distal right SFA with an ABI of 0.3. His left ABI 1.1 without obstructive disease. His most recent lower extremity Doppler Dopplers performed 9/32/16 revealed a right ABI 0.82 And a left ABI of 0.89. Over the last 3 months he's noticed anginal chest pain occurring both at rest and with exertion with left upper extremity radiation. He also complains of left lower extremity discomfort. to moderate anterolateral ischemia. Because of ongoing chest pain and a Myoview that showed anterolateral ischemia and he underwent cardiac catheterization on 02/01/15 revealing high grade segmental proximal right SVG disease and subtotally occluded diagonal  branch SVG. He underwent stenting of his RCA SVG successfully. He does have continued chest pain although somewhat improved since his last procedure. I brought him back for staged diagonal branch SVG intervention on 03/01/15. This was successful and since that time he denies chest pain or shortness of breath. He underwent a generator change by Dr. Sallyanne Kuster in January for end-of-life of his Medtronic pacemaker.Since I saw him 6 months ago he developed new left calf claudication of the last 3 months. Lower extremity arterial Doppler studies performed today revealed a decline in his left ABI from 0.87 down to 0.59 with an occluded left SFA that is a new finding. 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 He had a short segment occlusion of the proximal left SFA with patent mid left SFA stent. He had 90% segmental calcified mid right SFA stenosis with three-vessel runoff bilaterally. I ended up stenting his left external iliac artery with a 9 mm x 40 mm long nitinol self-expanding stent which improved his symptoms somewhat although he continuedto have lifestyle limiting claudication. He underwent staged intervention of his left SFA on 01/03/16. I stented the entire quadrant totally occluded segment with a 6 mm x 250 mm long Viabahn Stent. His claudication on the left has resolved and his ABIs normalized. He now complains of right leg medication. I did demonstrate a 90% calcified segmental mid right SFA stenosis at the time of angiography 11/25/15. He underwent diamondback orbital rotation atherectomy/drug-eluting  angioplasty of his mid calcified right SFA by myself 02/14/16. This follow-up Dopplers performed 02/24/16 revealed normal ABIs bilaterally. His claudication has improved. When I saw him in the office possibly 3 weeks ago he was complaining of fairly new  onset substernal chest pain over the prior several months occurring several times a week. These were new since his RCA and diagonal branch vein graft interventions at the end of 2016. He is on good antianginal medications. A Myoview stress test was ordered that showed subtle inferolateral ischemia and echo showed normal LV function.[name]  He was admitted to the hospital 02/06/2018 for 3 days with unstable angina.  He underwent catheterization by Dr. Claiborne Billings on 02/06/2018 revealing disease in all 3 vein grafts as well as in the LAD beyond LIMA insertion, 2 days later he underwent complex intervention on his diagonal and ramus branch vein grafts with restenting as well as intervention on his LAD via LIMA insertion.  His RCA vein graft was not intervened on.  He currently denies angina or claudication.  Since I saw him 2 months ago he is remained stable.  He is getting over a upper respiratory tract infection.  He denies chest pain or shortness of breath.  He does have stable claudication.  Current Meds  Medication Sig  . amLODipine (NORVASC) 10 MG tablet Take 10 mg by mouth daily.   [name] aspirin 81 MG chewable tablet Chew 1 tablet (81 mg total) by mouth daily.  [name] atorvastatin (LIPITOR) 80 MG tablet Take 1 tablet (80 mg total) by mouth daily at 6 PM.  . Cholecalciferol (VITAMIN D3) 2000 units capsule Take 2,000 Units by mouth 2 (two) times daily.  . clopidogrel (PLAVIX) 75 MG tablet Take 1 tablet (75 mg total) by mouth daily with breakfast.  . glipiZIDE (GLUCOTROL) 5 MG tablet Take 5 mg by mouth every evening.  . hydrALAZINE (APRESOLINE) 25 MG tablet Take 25 mg by mouth 3 (three) times daily.  . hydrochlorothiazide (HYDRODIURIL) 25 MG tablet Take 25 mg by mouth every morning.  . isosorbide mononitrate (IMDUR) 30 MG 24 hr tablet Take 1.5 tablets (45 mg total) by mouth daily.  . metFORMIN (GLUCOPHAGE) 1000 MG tablet Take 1 tablet (1,000 mg total) by mouth 2 (two) times daily with a meal.  . metoprolol tartrate  (LOPRESSOR) 100 MG tablet Take 1 tablet (100 mg total) by mouth 2 (two) times daily.  . nitroGLYCERIN (NITROSTAT) 0.4 MG SL tablet Place 1 tablet (0.4 mg total) under the tongue every 5 (five) minutes as needed for chest pain.  . potassium chloride SA (K-DUR,KLOR-CON) 20 MEQ tablet Take 1 tablet (20 mEq total) by mouth daily. (Patient taking differently: Take 20 mEq by mouth every evening. )  . rOPINIRole (REQUIP) 1 MG tablet Take 1 mg by mouth at bedtime.  . sodium chloride (OCEAN) 0.65 % SOLN nasal spray Place 1 spray into both nostrils as needed for congestion.   . tamsulosin (FLOMAX) 0.4 MG CAPS capsule Take 0.4 mg by mouth every evening.      Allergies  Allergen Reactions  . Carvedilol Itching  . Peanut-Containing Drug Products Anaphylaxis and Other (See Comments)    Tongue swelling is severe Other reaction(s): Unknown  . Ace Inhibitors Other (See Comments), Rash and Hives    Cough Other reaction(s): Unknown Cough   . Diltiazem Hcl Other (See Comments)    Unknown Other reaction(s): Other (See Comments), Unknown Unknown   . Duloxetine Hcl     Other reaction(s): Other Urinary frequency  .  Meloxicam Other (See Comments)    GI upset Other reaction(s): Other (See Comments), Unknown GI upset GI upset GI BLEED   . Clonidine Hcl Other (See Comments)    Patch only - skin irritation    Social History   Socioeconomic History  . Marital status: Married    Spouse name: Not on file  . Number of children: Not on file  . Years of education: Not on file  . Highest education level: Not on file  Occupational History  . Not on file  Social Needs  . Financial resource strain: Not on file  . Food insecurity:    Worry: Not on file    Inability: Not on file  . Transportation needs:    Medical: Not on file    Non-medical: Not on file  Tobacco Use  . Smoking status: Former Smoker    Years: 10.00    Types: Pipe    Last attempt to quit: 1973    Years since quitting: 47.1  .  Smokeless tobacco: Never Used  . Tobacco comment: "quit smoking in 1973"  Substance and Sexual Activity  . Alcohol use: No  . Drug use: No  . Sexual activity: Not Currently  Lifestyle  . Physical activity:    Days per week: Not on file    Minutes per session: Not on file  . Stress: Not on file  Relationships  . Social connections:    Talks on phone: Not on file    Gets together: Not on file    Attends religious service: Not on file    Active member of club or organization: Not on file    Attends meetings of clubs or organizations: Not on file    Relationship status: Not on file  . Intimate partner violence:    Fear of current or ex partner: Not on file    Emotionally abused: Not on file    Physically abused: Not on file    Forced sexual activity: Not on file  Other Topics Concern  . Not on file  Social History Narrative  . Not on file     Review of Systems: General: negative for chills, fever, night sweats or weight changes.  Cardiovascular: negative for chest pain, dyspnea on exertion, edema, orthopnea, palpitations, paroxysmal nocturnal dyspnea or shortness of breath Dermatological: negative for rash Respiratory: negative for cough or wheezing Urologic: negative for hematuria Abdominal: negative for nausea, vomiting, diarrhea, bright red blood per rectum, melena, or hematemesis Neurologic: negative for visual changes, syncope, or dizziness All other systems reviewed and are otherwise negative except as noted above.    Blood pressure 138/76, pulse 90, height 5\' 7"  (1.702 m), weight 170 lb (77.1 kg), SpO2 98 %.  General appearance: alert and no distress Neck: no adenopathy, no carotid bruit, no JVD, supple, symmetrical, trachea midline and thyroid not enlarged, symmetric, no tenderness/mass/nodules Lungs: clear to auscultation bilaterally Heart: regular rate and rhythm, S1, S2 normal, no murmur, click, rub or gallop Extremities: extremities normal, atraumatic, no  cyanosis or edema Pulses: 2+ and symmetric Skin: Skin color, texture, turgor normal. No rashes or lesions Neurologic: Alert and oriented X 3, normal strength and tone. Normal symmetric reflexes. Normal coordination and gait  EKG not performed today  ASSESSMENT AND PLAN:   Essential hypertension History of essential hypertension with blood pressure measured today at 138/76.  He is on amlodipine hydralazine, HydroDIURIL and metoprolol.  Continue current meds at current dosing.  SSS (sick sinus syndrome), medtronic adapta History  of sick sinus syndrome status post permanent transvenous pacemaker insertion 10/13/2005 followed by Dr. Sallyanne Kuster.  Hyperlipidemia due to type 2 diabetes mellitus ([employer]) History of hyperlipidemia on statin therapy with lipid profile performed 04/16/2018 revealing a total cholesterol 78, LDL 33 and HDL 31.  Peripheral arterial disease (HCC) History of peripheral arterial disease status post multiple lower extremity interventions both percutaneously and surgically.  He was angiogramed 11/25/2015 with demonstration of a 90% distal left external iliac artery stenosis just proximal to a previously placed interposition graft with a short occlusion of the proximal left SFA and patent mid left SFA stent.  He also had a 90% calcified mid right SFA stenosis with three-vessel runoff.  I ultimately ended up stenting the left external iliac artery with a nitinol self-expanding stent and he underwent staged left SFA intervention 01/03/2016.  He underwent Dynabac orbital rotational atherectomy and drug-eluting balloon angioplasty of a calcified right SFA by myself 02/14/2016.  He does complain of bilateral lower extremity claudication with recent Dopplers performed 02/28/2018 revealing a right ABI 0.82 and a left of 1.05 with a high-frequency signal in the mid right SFA.      Lorretta Harp MD FACP,FACC,FAHA, [facility] 05/08/2018 1:39 PM

## 2018-07-09 ENCOUNTER — Other Ambulatory Visit: Payer: Self-pay

## 2018-07-09 ENCOUNTER — Ambulatory Visit (INDEPENDENT_AMBULATORY_CARE_PROVIDER_SITE_OTHER): Payer: Medicare HMO | Admitting: *Deleted

## 2018-07-09 DIAGNOSIS — I495 Sick sinus syndrome: Secondary | ICD-10-CM

## 2018-07-10 ENCOUNTER — Telehealth: Payer: Self-pay

## 2018-07-10 NOTE — Telephone Encounter (Signed)
Spoke with patient to remind of missed remote transmission 

## 2018-07-11 LAB — CUP PACEART REMOTE DEVICE CHECK
Battery Impedance: 210 Ohm
Battery Remaining Longevity: 127 mo
Battery Voltage: 2.8 V
Brady Statistic AP VP Percent: 1 %
Brady Statistic AP VS Percent: 99 %
Brady Statistic AS VP Percent: 0 %
Brady Statistic AS VS Percent: 0 %
Date Time Interrogation Session: 20200402154742
Implantable Lead Implant Date: 20070706
Implantable Lead Implant Date: 20070706
Implantable Lead Location: 753859
Implantable Lead Location: 753860
Implantable Lead Model: 5076
Implantable Lead Model: 5092
Implantable Pulse Generator Implant Date: 20170113
Lead Channel Impedance Value: 473 Ohm
Lead Channel Impedance Value: 783 Ohm
Lead Channel Pacing Threshold Amplitude: 0.875 V
Lead Channel Pacing Threshold Amplitude: 1.375 V
Lead Channel Pacing Threshold Pulse Width: 0.4 ms
Lead Channel Pacing Threshold Pulse Width: 0.4 ms
Lead Channel Sensing Intrinsic Amplitude: 5.6 mV
Lead Channel Setting Pacing Amplitude: 1.875
Lead Channel Setting Pacing Amplitude: 2.75 V
Lead Channel Setting Pacing Pulse Width: 0.4 ms
Lead Channel Setting Sensing Sensitivity: 2.8 mV

## 2018-07-17 ENCOUNTER — Encounter: Payer: Self-pay | Admitting: Cardiology

## 2018-07-17 NOTE — Progress Notes (Signed)
Remote pacemaker transmission.   

## 2018-08-07 ENCOUNTER — Telehealth: Payer: Self-pay

## 2018-08-07 NOTE — Telephone Encounter (Signed)
LM2CB Per admin-call and reschedule appt with lawrence, NP reschedule to a Dr Gwenlyn Found appt 5-14 2PM scheduled w/dr berry

## 2018-08-12 ENCOUNTER — Telehealth: Payer: Self-pay | Admitting: Cardiovascular Disease

## 2018-08-12 ENCOUNTER — Telehealth: Payer: Medicare HMO | Admitting: Adult Health

## 2018-08-12 NOTE — Telephone Encounter (Signed)
New Message   Patient states they had appointment for today that wasn't suppose to be canceled and really need to talk to the doctor.  Please call the patient back.

## 2018-08-12 NOTE — Telephone Encounter (Signed)
error 

## 2018-08-12 NOTE — Telephone Encounter (Signed)
Follow up:    Patient daughter calling concering a code for her fathers appt. Please call back.

## 2018-08-12 NOTE — Telephone Encounter (Addendum)
lmtcb on [phone] voicemail  Called to inform pt that appt with Jory Sims, DNP today was cancelled d/t pt having upcoming appt on 5/14 with Dr. Gwenlyn Found (per appt notes)

## 2018-08-14 ENCOUNTER — Encounter (HOSPITAL_COMMUNITY): Payer: Medicare HMO

## 2018-08-14 DIAGNOSIS — I25709 Atherosclerosis of coronary artery bypass graft(s), unspecified, with unspecified angina pectoris: Secondary | ICD-10-CM | POA: Diagnosis not present

## 2018-08-14 DIAGNOSIS — M160 Bilateral primary osteoarthritis of hip: Secondary | ICD-10-CM | POA: Diagnosis not present

## 2018-08-14 DIAGNOSIS — M170 Bilateral primary osteoarthritis of knee: Secondary | ICD-10-CM | POA: Diagnosis not present

## 2018-08-14 DIAGNOSIS — I10 Essential (primary) hypertension: Secondary | ICD-10-CM | POA: Diagnosis not present

## 2018-08-14 DIAGNOSIS — Z7984 Long term (current) use of oral hypoglycemic drugs: Secondary | ICD-10-CM | POA: Diagnosis not present

## 2018-08-14 DIAGNOSIS — E1142 Type 2 diabetes mellitus with diabetic polyneuropathy: Secondary | ICD-10-CM | POA: Diagnosis not present

## 2018-08-14 DIAGNOSIS — E1151 Type 2 diabetes mellitus with diabetic peripheral angiopathy without gangrene: Secondary | ICD-10-CM | POA: Diagnosis not present

## 2018-08-14 DIAGNOSIS — N401 Enlarged prostate with lower urinary tract symptoms: Secondary | ICD-10-CM | POA: Diagnosis not present

## 2018-08-16 DIAGNOSIS — E1142 Type 2 diabetes mellitus with diabetic polyneuropathy: Secondary | ICD-10-CM | POA: Diagnosis not present

## 2018-08-16 DIAGNOSIS — R5383 Other fatigue: Secondary | ICD-10-CM | POA: Diagnosis not present

## 2018-08-16 DIAGNOSIS — I10 Essential (primary) hypertension: Secondary | ICD-10-CM | POA: Diagnosis not present

## 2018-08-16 DIAGNOSIS — G4733 Obstructive sleep apnea (adult) (pediatric): Secondary | ICD-10-CM | POA: Diagnosis not present

## 2018-08-16 DIAGNOSIS — G2581 Restless legs syndrome: Secondary | ICD-10-CM | POA: Diagnosis not present

## 2018-08-16 DIAGNOSIS — I25709 Atherosclerosis of coronary artery bypass graft(s), unspecified, with unspecified angina pectoris: Secondary | ICD-10-CM | POA: Diagnosis not present

## 2018-08-16 DIAGNOSIS — E1151 Type 2 diabetes mellitus with diabetic peripheral angiopathy without gangrene: Secondary | ICD-10-CM | POA: Diagnosis not present

## 2018-08-16 DIAGNOSIS — I739 Peripheral vascular disease, unspecified: Secondary | ICD-10-CM | POA: Diagnosis not present

## 2018-08-16 DIAGNOSIS — N401 Enlarged prostate with lower urinary tract symptoms: Secondary | ICD-10-CM | POA: Diagnosis not present

## 2018-08-20 ENCOUNTER — Telehealth: Payer: Self-pay | Admitting: Cardiovascular Disease

## 2018-08-20 NOTE — Telephone Encounter (Signed)
Mychart, smartphone, consent, pre reg complete 08/20/18 AF °

## 2018-08-21 DIAGNOSIS — E1142 Type 2 diabetes mellitus with diabetic polyneuropathy: Secondary | ICD-10-CM | POA: Diagnosis not present

## 2018-08-21 DIAGNOSIS — R5383 Other fatigue: Secondary | ICD-10-CM | POA: Diagnosis not present

## 2018-08-21 DIAGNOSIS — Z7984 Long term (current) use of oral hypoglycemic drugs: Secondary | ICD-10-CM | POA: Diagnosis not present

## 2018-08-21 DIAGNOSIS — E1151 Type 2 diabetes mellitus with diabetic peripheral angiopathy without gangrene: Secondary | ICD-10-CM | POA: Diagnosis not present

## 2018-08-22 ENCOUNTER — Other Ambulatory Visit: Payer: Self-pay

## 2018-08-22 ENCOUNTER — Telehealth (INDEPENDENT_AMBULATORY_CARE_PROVIDER_SITE_OTHER): Payer: Medicare HMO | Admitting: Cardiovascular Disease

## 2018-08-22 VITALS — Ht 68.0 in | Wt 166.0 lb

## 2018-08-22 DIAGNOSIS — I10 Essential (primary) hypertension: Secondary | ICD-10-CM | POA: Diagnosis not present

## 2018-08-22 DIAGNOSIS — I25810 Atherosclerosis of coronary artery bypass graft(s) without angina pectoris: Secondary | ICD-10-CM

## 2018-08-22 DIAGNOSIS — Z950 Presence of cardiac pacemaker: Secondary | ICD-10-CM | POA: Diagnosis not present

## 2018-08-22 DIAGNOSIS — I480 Paroxysmal atrial fibrillation: Secondary | ICD-10-CM | POA: Diagnosis not present

## 2018-08-22 DIAGNOSIS — I739 Peripheral vascular disease, unspecified: Secondary | ICD-10-CM | POA: Diagnosis not present

## 2018-08-22 DIAGNOSIS — E7800 Pure hypercholesterolemia, unspecified: Secondary | ICD-10-CM | POA: Diagnosis not present

## 2018-08-22 NOTE — Patient Instructions (Signed)
Medication Instructions:   Your physician recommends that you continue on your current medications as directed. Please refer to the Current Medication list given to you today.  Labwork:  NONE  Testing/Procedures:  Your physician has requested that you have a lower extremity arterial exercise duplex in October 2020. During this test, exercise and ultrasound are used to evaluate arterial blood flow in the legs. Allow one hour for this exam. There are no restrictions or special instructions. Your physician has requested that you have an ankle brachial index (ABI) in October 2020. During this test an ultrasound and blood pressure cuff are used to evaluate the arteries that supply the arms and legs with blood. Allow thirty minutes for this exam. There are no restrictions or special instructions.  Follow-Up:  Your physician recommends that you schedule a follow-up appointment in: 3-4 months with Dr. Gwenlyn Found.   Any Other Special Instructions Will Be Listed Below (If Applicable).  If you need a refill on your cardiac medications before your next appointment, please call your pharmacy.

## 2018-08-22 NOTE — Progress Notes (Signed)
Virtual Visit via Video Note   This visit type was conducted due to national recommendations for restrictions regarding the COVID-19 Pandemic (e.g. social distancing) in an effort to limit this patient's exposure and mitigate transmission in our community.  Due to his co-morbid illnesses, this patient is at least at moderate risk for complications without adequate follow up.  This format is felt to be most appropriate for this patient at this time.  All issues noted in this document were discussed and addressed.  A limited physical exam was performed with this format.  Please refer to the patient's chart for his consent to telehealth for [facility].   Date:  08/22/2018   ID:  Robert Burns, DOB [birth year], MRN [id number]  Patient Location: Home Provider Location: Home  PCP:  Robert Orn, MD  Cardiologist:  Robert Burow, MD  Electrophysiologist:  None   Evaluation Performed:  Follow-Up Visit  Chief Complaint: Follow-up CAD and PAD  History of Present Illness:    Robert Burns is a 78 y.o.  married Caucasian male, father of 2, grandfather to 3 grandchildren whose wife Robert Burns who is also a patient of mine. I last saw him in theoffice  05/08/2018.[name] He has a history of CAD status post coronary artery bypass grafting March 2004 with a LIMA to his LAD, a vein to a diagonal branch, a sequential vein to a ramus and OM branch, as well as a vein to the PDA. Last functional study performed July 28, 2011, was entirely normal. 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 He had a pacemaker placed for sick sinus syndrome November 2008 followed by Robert Burns. He has obstructive sleep apnea on CPAP. He complain of left thigh pain and I angiogram'd him revealing patent arteries though he did have a space-occupying lesion which was removed by Robert Burns with placement of an interposition 6-mm Gore-Tex graft. The pathology was uncertain. He continues to have  neuropathic pain. Robert Burns follows his lipid profile.Since I saw him back 11/07/12 he has done well. Followup Dopplers performed in our office 09/27/12 revealed a high-grade lesion in the distal right SFA with an ABI of 0.3. His left ABI 1.1 without obstructive disease. His most recent lower extremity Doppler Dopplers performed 9/32/16 revealed a right ABI 0.82 And a left ABI of 0.89. Over the last 3 months he's noticed anginal chest pain occurring both at rest and with exertion with left upper extremity radiation. He also complains of left lower extremity discomfort. to moderate anterolateral ischemia. Because of ongoing chest pain and a Myoview that showed anterolateral ischemia and he underwent cardiac catheterization on 02/01/15 revealing high grade segmental proximal right SVG disease and subtotally occluded diagonal branch SVG. He underwent stenting of his RCA SVG successfully. He does have continued chest pain although somewhat improved since his last procedure. I brought him back for staged diagonal branch SVG intervention on 03/01/15. This was successful and since that time he denies chest pain or shortness of breath. He underwent a generator change by Robert Burns in January for end-of-life of his Medtronic pacemaker.Since I saw him 6 months ago he developed new left calf claudication of the last 3 months. Lower extremity arterial Doppler studies performed today revealed a decline in his left ABI from 0.87 down to 0.59 with an occluded left SFA that is a new finding. He underwent elective lower extremity angiography 11/25/15 with demonstration of a 90% distal left external carotid  stenosis just proximal to the previously placed background interposition graft. He had a short segment occlusion of the proximal left SFA with patent mid left SFA stent. He had 90% segmental calcified mid right SFA stenosis with three-vessel runoff bilaterally. I ended up stenting his left external iliac artery with a 9 mm x  40 mm long nitinol self-expanding stent which improved his symptoms somewhat although he continuedto have lifestyle limiting claudication. He underwent staged intervention of his left SFA on 01/03/16. I stented the entire quadrant totally occluded segment with a 6 mm x 250 mm long Viabahn Stent. His claudication on the left has resolved and his ABIs normalized. He now complains of right leg medication. I did demonstrate a 90% calcified segmental mid right SFA stenosis at the time of angiography 11/25/15. He underwent diamondback orbital rotation atherectomy/drug-eluting angioplasty of his mid calcified right SFA by myself 02/14/16. This follow-up Dopplers performed 02/24/16 revealed normal ABIs bilaterally. His claudication has improved. When I saw him in the office possibly 3 weeks ago he was complaining of fairly new onset substernal chest pain over the prior several months occurring several times a week. These were new since his RCA and diagonal branch vein graft interventions at the end of 2016. He is on good antianginal medications. A Myoview stress test was ordered that showed subtle inferolateral ischemia and echo showed normal LV function.[name]  He was admitted to the hospital 02/06/2018 for 3 days with unstable angina. He underwent catheterization by Dr. Claiborne Burns on 02/06/2018 revealing disease in all 3 vein grafts as well as in the LAD beyond LIMA insertion, 2 days later he underwent complex intervention on his diagonal and ramus branch vein grafts with restenting as well as intervention on his LAD via LIMA insertion. His RCA vein graft was not intervened on. He currently denies angina or claudication.  Since I saw him in the office 3 months ago he is sheltering in place and socially distancing.  He spends a lot more time outside with his wife Robert Burns.  He has nonspecific symptoms of lethargy and fatigue in the morning.  He saw his PCP, Robert Burns, who stopped 1 of his medicines that he was on for  restless leg and he feels somewhat improved.  He had blood work drawn yesterday.  He denies chest pain or shortness of breath or claudication.   The patient does not have symptoms concerning for COVID-19 infection (fever, chills, cough, or new shortness of breath).    Past Medical History:  Diagnosis Date   Arthritis    "all over" (11/25/2015)   CAD (coronary artery disease)    a. s/p CABG  06/2002; b. 02/01/15 PCI: DES to prox SVG to PDA, staged PCI of SVG to Diag in 02/2015; c. 04/2017 Cath/PCI: LM nl, LAD 100ost, 21m, 75d, LCX 60ost, OM2 80, RCA 100ost, RPDA 80, LIMA->LAD ok, VG->D1 patent stent, VG->RPDA patent stent, 50p, VG->OM1->OM2 90p (3.0x24 Synergy DES), 100 between OM1->OM2 (med rx).   Cancer [facility])    Right Shoulder, Left Leg- BCC, SCC, AND MELANOMA   Hyperlipidemia    Hypertension    Leg pain    OSA on CPAP    PAD (peripheral artery disease) ([place])    a. 10/2002 L SFA PTA/BMS; b. 8/17 LE Angio: LEIA 90 (9x40 self exp stent), LSFA short segment prox occlusion (staged PTA/stenting 01/03/2016), patent mid stent, RSFA 60m (staged PTA/DEB 02/14/2016).   Presence of permanent cardiac pacemaker    SSS (sick sinus syndrome) ([place])  a. s/p PPM in 2007 with gen change 04/2015 - Medtronic Adapta ADDRL1, ser # [id number] H.   Type II diabetes mellitus (HCC)    Urgency of urination    Past Surgical History:  Procedure Laterality Date   CARDIAC CATHETERIZATION N/A 02/01/2015   Procedure: Left Heart Cath and Coronary Angiography;  Surgeon: Lorretta Harp, MD;  Location: [place] [facility];  Service: Cardiovascular;  Laterality: N/A;   CARDIAC CATHETERIZATION N/A 02/01/2015   Procedure: Coronary Stent Intervention;  Surgeon: Lorretta Harp, MD;  Location: [place] [facility];  Service: Cardiovascular;  Laterality: N/A;   CARDIAC CATHETERIZATION  06/2002   "just before bypass OR"   CARDIAC CATHETERIZATION N/A 03/01/2015   Procedure: Coronary Stent Intervention;  Surgeon:  Lorretta Harp, MD;  Location: [place] [facility];  Service: Cardiovascular;  Laterality: N/A;   CARDIAC CATHETERIZATION  02/06/2018   CORONARY ANGIOPLASTY     CORONARY ARTERY BYPASS GRAFT  06/2002   x5, LIMA-LAD;VG- Diag; seq VG- ramus & OM branch; VG-PDA   CORONARY STENT INTERVENTION N/A 04/12/2017   Procedure: CORONARY STENT INTERVENTION;  Surgeon: Lorretta Harp, MD;  Location: [place] [facility];  Service: Cardiovascular;  Laterality: N/A;   CORONARY STENT INTERVENTION N/A 02/08/2018   Procedure: CORONARY STENT INTERVENTION;  Surgeon: Jettie Booze, MD;  Location: [place] [facility];  Service: Cardiovascular;  Laterality: N/A;   EP IMPLANTABLE DEVICE N/A 04/23/2015   Procedure: PPM Generator Changeout;  Surgeon: Sanda Klein, MD;  Location: [place] [facility];  Service: Cardiovascular;  Laterality: N/A;   FEMORAL ARTERY STENT Left ~ 2014   "taken out of my leg; couldn' catorgorize what kind so the put it under all 3"; cataroziepitheloid hemanioendotheliomau   FOOT FRACTURE SURGERY Left [place] / REPLACE / REMOVE PACEMAKER  10/13/05   right side, medtronic Adapta   KNEE HARDWARE REMOVAL Right 1950's   "3-4 months after the insertion"   KNEE SURGERY Right 1950's   "broke my lower leg; had to put pin in my knee to keep lower leg in place til it healed"   LEFT HEART CATH AND CORS/GRAFTS ANGIOGRAPHY N/A 04/12/2017   Procedure: LEFT HEART CATH AND CORS/GRAFTS ANGIOGRAPHY;  Surgeon: Lorretta Harp, MD;  Location: [place] [facility];  Service: Cardiovascular;  Laterality: N/A;   LEFT HEART CATH AND CORS/GRAFTS ANGIOGRAPHY N/A 02/06/2018   Procedure: LEFT HEART CATH AND CORS/GRAFTS ANGIOGRAPHY;  Surgeon: Troy Sine, MD;  Location: [place] [facility];  Service: Cardiovascular;  Laterality: N/A;   PERIPHERAL VASCULAR CATHETERIZATION N/A 11/25/2015   Procedure: Lower Extremity Angiography;  Surgeon: Lorretta Harp, MD;  Location: [place]  [facility];  Service: Cardiovascular;  Laterality: N/A;   PERIPHERAL VASCULAR CATHETERIZATION Left 11/25/2015   Procedure: Peripheral Vascular Intervention;  Surgeon: Lorretta Harp, MD;  Location: [place] [facility];  Service: Cardiovascular;  Laterality: Left;  external iliac   PERIPHERAL VASCULAR CATHETERIZATION N/A 01/03/2016   Procedure: Lower Extremity Angiography;  Surgeon: Lorretta Harp, MD;  Location: [place] [facility];  Service: Cardiovascular;  Laterality: N/A;   PERIPHERAL VASCULAR CATHETERIZATION Left 01/03/2016   Procedure: Peripheral Vascular Intervention;  Surgeon: Lorretta Harp, MD;  Location: [place] [facility];  Service: Cardiovascular;  Laterality: Left;  SFA   PERIPHERAL VASCULAR CATHETERIZATION Right 02/14/2016   Procedure: Peripheral Vascular Atherectomy;  Surgeon: Lorretta Harp, MD;  Location: [place] [facility];  Service: Cardiovascular;  Laterality: Right;  SFA   POPLITEAL ARTERY STENT  01/03/2016   Contralateral access with a 7 [place] crossover sheath (second order catheter placement)   TONSILLECTOMY AND ADENOIDECTOMY     TUMOR EXCISION Right ~ 2005   cancerous tumor removed from shoulder   TUMOR EXCISION Right ~ 2000   benign tumor removed from under shoulder   TUMOR EXCISION Left 06/02/2010   resection of Lt SFA wth interposition of Gore-Tex graft     No outpatient medications have been marked as taking for the 08/22/18 encounter (Appointment) with Lorretta Harp, MD.     Allergies:   Carvedilol; Peanut-containing drug products; Ace inhibitors; Diltiazem hcl; Duloxetine hcl; Meloxicam; and Clonidine hcl   Social History   Tobacco Use   Smoking status: Former Smoker    Years: 10.00    Types: Pipe    Last attempt to quit: 1973    Years since quitting: 47.3   Smokeless tobacco: Never Used   Tobacco comment: "quit smoking in 1973"  Substance Use Topics   Alcohol use: No   Drug use: No     Family Hx: The patient's family history  includes Coronary artery disease in his mother; Diabetes in his father and sister; Heart attack in his father and mother; Heart disease in his father, mother, and sister; Hyperlipidemia in his father; Hypertension in his mother and sister.  ROS:   Please see the history of present illness.     All other systems reviewed and are negative.   Prior CV studies:   The following studies were reviewed today:  Lower extremity arterial Doppler studies performed 04/16/2018  Labs/Other Tests and Data Reviewed:    EKG:  No ECG reviewed.  Recent Labs: 02/18/2018: BUN 29; Creatinine, Ser 1.09; Hemoglobin 12.6; Platelets 208; Potassium 4.6; Sodium 142   Recent Lipid Panel No results found for: CHOL, TRIG, HDL, CHOLHDL, LDLCALC, LDLDIRECT  Wt Readings from Last 3 Encounters:  05/08/18 170 lb (77.1 kg)  03/05/18 177 lb (80.3 kg)  02/20/18 177 lb (80.3 kg)     Objective:    Vital Signs:  There were no vitals taken for this visit.   VITAL SIGNS:  reviewed GEN:  no acute distress RESPIRATORY:  normal respiratory effort, symmetric expansion NEURO:  alert and oriented x 3, no obvious focal deficit PSYCH:  normal affect  ASSESSMENT & PLAN:    1. Coronary artery disease- history of CAD status post CABG March 2004 with a LIMA to the LAD, vein to diagonal branches sequential vein to the ramus and OM as well as a vein to the PDA.  Because of chest pain he had a Myoview performed 02/01/2015 which led to cardiac catheterization revealing high-grade segmental proximal right SVG disease and a subtotally occluded diagonal branch vein graft.  He underwent successful stenting of his RCA vein graft successfully.  I did bring him back for staged diagonal branch SVG intervention 03/01/2015.  He was admitted 02/06/2018 with unstable angina and underwent cardiac catheterization by Dr. Claiborne Burns 02/06/2018 revealing disease in all 3 vein grafts as well as the LAD beyond LIMA insertion.  2 days later he underwent complex  intervention of his diagonal and ramus branch vein grafts with restenting as well as intervention on his LAD via the LIMA.  His RCA vein graft was not intervened on.  He felt clinically improved after this. 2: Peripheral arterial disease- history of PAD status post multiple lower extremity interventions.  He did have an interposition graft in his  proximal left SFA.  I stented his left external iliac with a nitinol self-expanding stent and staged his left SFA intervention 01/03/2016.  He underwent diamondback orbital rotational atherectomy of his right SFA 02/24/2016.  He really denies claudication.  His most recent Dopplers performed 02/28/2018 revealed a right ABI of 0.82 and a left of 1.05.  He did have a high-frequency signal in his mid right SFA. 3: Essential hypertension- on amlodipine, hydralazine, hydrochlorothiazide and metoprolol Hyperlipidemia- history of hyperlipidemia on atorvastatin with lipid profile performed 04/16/2018 revealing a total cholesterol 78, LDL of 33 and HDL 31    COVID-19 Education: The signs and symptoms of COVID-19 were discussed with the patient and how to seek care for testing (follow up with PCP or arrange E-visit).  The importance of social distancing was discussed today.  Time:   Today, I have spent 16 minutes with the patient with telehealth technology discussing the above problems.     Medication Adjustments/Labs and Tests Ordered: Current medicines are reviewed at length with the patient today.  Concerns regarding medicines are outlined above.   Tests Ordered: No orders of the defined types were placed in this encounter.   Medication Changes: No orders of the defined types were placed in this encounter.   Disposition:  Follow up in 4 month(s)  Signed, Robert Burow, MD  08/22/2018 12:15 PM    [place]

## 2018-08-22 NOTE — Telephone Encounter (Signed)
Pt had appt 5/14

## 2018-08-28 DIAGNOSIS — Z7984 Long term (current) use of oral hypoglycemic drugs: Secondary | ICD-10-CM | POA: Diagnosis not present

## 2018-08-28 DIAGNOSIS — E1151 Type 2 diabetes mellitus with diabetic peripheral angiopathy without gangrene: Secondary | ICD-10-CM | POA: Diagnosis not present

## 2018-09-10 DIAGNOSIS — H35353 Cystoid macular degeneration, bilateral: Secondary | ICD-10-CM | POA: Diagnosis not present

## 2018-09-10 DIAGNOSIS — H40033 Anatomical narrow angle, bilateral: Secondary | ICD-10-CM | POA: Diagnosis not present

## 2018-09-10 DIAGNOSIS — H35372 Puckering of macula, left eye: Secondary | ICD-10-CM | POA: Diagnosis not present

## 2018-09-10 DIAGNOSIS — Z7984 Long term (current) use of oral hypoglycemic drugs: Secondary | ICD-10-CM | POA: Diagnosis not present

## 2018-09-10 DIAGNOSIS — H43811 Vitreous degeneration, right eye: Secondary | ICD-10-CM | POA: Diagnosis not present

## 2018-09-10 DIAGNOSIS — H34811 Central retinal vein occlusion, right eye, with macular edema: Secondary | ICD-10-CM | POA: Diagnosis not present

## 2018-09-10 DIAGNOSIS — E119 Type 2 diabetes mellitus without complications: Secondary | ICD-10-CM | POA: Diagnosis not present

## 2018-09-10 DIAGNOSIS — H2513 Age-related nuclear cataract, bilateral: Secondary | ICD-10-CM | POA: Diagnosis not present

## 2018-10-08 ENCOUNTER — Ambulatory Visit (INDEPENDENT_AMBULATORY_CARE_PROVIDER_SITE_OTHER): Payer: Medicare HMO | Admitting: *Deleted

## 2018-10-08 DIAGNOSIS — I495 Sick sinus syndrome: Secondary | ICD-10-CM

## 2018-10-09 ENCOUNTER — Telehealth: Payer: Self-pay

## 2018-10-09 NOTE — Telephone Encounter (Signed)
Spoke with patient to remind of missed remote transmission 

## 2018-10-10 LAB — CUP PACEART REMOTE DEVICE CHECK
Battery Impedance: 210 Ohm
Battery Remaining Longevity: 129 mo
Battery Voltage: 2.8 V
Brady Statistic AP VP Percent: 1 %
Brady Statistic AP VS Percent: 99 %
Brady Statistic AS VP Percent: 0 %
Brady Statistic AS VS Percent: 0 %
Date Time Interrogation Session: 20200702023131
Implantable Lead Implant Date: 20070706
Implantable Lead Implant Date: 20070706
Implantable Lead Location: 753859
Implantable Lead Location: 753860
Implantable Lead Model: 5076
Implantable Lead Model: 5092
Implantable Pulse Generator Implant Date: 20170113
Lead Channel Impedance Value: 447 Ohm
Lead Channel Impedance Value: 726 Ohm
Lead Channel Pacing Threshold Amplitude: 0.75 V
Lead Channel Pacing Threshold Amplitude: 1.375 V
Lead Channel Pacing Threshold Pulse Width: 0.4 ms
Lead Channel Pacing Threshold Pulse Width: 0.4 ms
Lead Channel Sensing Intrinsic Amplitude: 8 mV
Lead Channel Setting Pacing Amplitude: 1.5 V
Lead Channel Setting Pacing Amplitude: 2.75 V
Lead Channel Setting Pacing Pulse Width: 0.4 ms
Lead Channel Setting Sensing Sensitivity: 5.6 mV

## 2018-10-20 ENCOUNTER — Encounter: Payer: Self-pay | Admitting: Cardiology

## 2018-10-20 NOTE — Progress Notes (Signed)
Remote pacemaker transmission.   

## 2018-10-24 DIAGNOSIS — G4710 Hypersomnia, unspecified: Secondary | ICD-10-CM | POA: Diagnosis not present

## 2018-10-31 DIAGNOSIS — M160 Bilateral primary osteoarthritis of hip: Secondary | ICD-10-CM | POA: Diagnosis not present

## 2018-10-31 DIAGNOSIS — N401 Enlarged prostate with lower urinary tract symptoms: Secondary | ICD-10-CM | POA: Diagnosis not present

## 2018-10-31 DIAGNOSIS — E1142 Type 2 diabetes mellitus with diabetic polyneuropathy: Secondary | ICD-10-CM | POA: Diagnosis not present

## 2018-10-31 DIAGNOSIS — E1151 Type 2 diabetes mellitus with diabetic peripheral angiopathy without gangrene: Secondary | ICD-10-CM | POA: Diagnosis not present

## 2018-10-31 DIAGNOSIS — I25709 Atherosclerosis of coronary artery bypass graft(s), unspecified, with unspecified angina pectoris: Secondary | ICD-10-CM | POA: Diagnosis not present

## 2018-10-31 DIAGNOSIS — M170 Bilateral primary osteoarthritis of knee: Secondary | ICD-10-CM | POA: Diagnosis not present

## 2018-10-31 DIAGNOSIS — Z7984 Long term (current) use of oral hypoglycemic drugs: Secondary | ICD-10-CM | POA: Diagnosis not present

## 2018-10-31 DIAGNOSIS — I10 Essential (primary) hypertension: Secondary | ICD-10-CM | POA: Diagnosis not present

## 2018-11-11 ENCOUNTER — Emergency Department (HOSPITAL_COMMUNITY): Payer: Medicare HMO

## 2018-11-11 ENCOUNTER — Encounter (HOSPITAL_COMMUNITY): Payer: Self-pay | Admitting: *Deleted

## 2018-11-11 ENCOUNTER — Telehealth: Payer: Self-pay | Admitting: Cardiovascular Disease

## 2018-11-11 ENCOUNTER — Other Ambulatory Visit: Payer: Self-pay

## 2018-11-11 ENCOUNTER — Inpatient Hospital Stay (HOSPITAL_COMMUNITY)
Admission: EM | Admit: 2018-11-11 | Discharge: 2018-11-13 | DRG: 247 | Disposition: A | Discharge status: Home / Self Care | Payer: Medicare HMO | Attending: Interventional Cardiology | Admitting: Interventional Cardiology

## 2018-11-11 DIAGNOSIS — Z79899 Other long term (current) drug therapy: Secondary | ICD-10-CM

## 2018-11-11 DIAGNOSIS — R079 Chest pain, unspecified: Secondary | ICD-10-CM | POA: Diagnosis not present

## 2018-11-11 DIAGNOSIS — G4733 Obstructive sleep apnea (adult) (pediatric): Secondary | ICD-10-CM | POA: Diagnosis present

## 2018-11-11 DIAGNOSIS — I480 Paroxysmal atrial fibrillation: Secondary | ICD-10-CM | POA: Diagnosis present

## 2018-11-11 DIAGNOSIS — Z951 Presence of aortocoronary bypass graft: Secondary | ICD-10-CM | POA: Diagnosis not present

## 2018-11-11 DIAGNOSIS — Z7984 Long term (current) use of oral hypoglycemic drugs: Secondary | ICD-10-CM | POA: Diagnosis not present

## 2018-11-11 DIAGNOSIS — Z955 Presence of coronary angioplasty implant and graft: Secondary | ICD-10-CM

## 2018-11-11 DIAGNOSIS — I25110 Atherosclerotic heart disease of native coronary artery with unstable angina pectoris: Secondary | ICD-10-CM | POA: Diagnosis present

## 2018-11-11 DIAGNOSIS — E118 Type 2 diabetes mellitus with unspecified complications: Secondary | ICD-10-CM

## 2018-11-11 DIAGNOSIS — E7800 Pure hypercholesterolemia, unspecified: Secondary | ICD-10-CM | POA: Diagnosis not present

## 2018-11-11 DIAGNOSIS — Z950 Presence of cardiac pacemaker: Secondary | ICD-10-CM | POA: Diagnosis not present

## 2018-11-11 DIAGNOSIS — I10 Essential (primary) hypertension: Secondary | ICD-10-CM | POA: Diagnosis present

## 2018-11-11 DIAGNOSIS — I200 Unstable angina: Secondary | ICD-10-CM | POA: Diagnosis present

## 2018-11-11 DIAGNOSIS — Z91010 Allergy to peanuts: Secondary | ICD-10-CM | POA: Diagnosis not present

## 2018-11-11 DIAGNOSIS — T82858A Stenosis of vascular prosthetic devices, implants and grafts, initial encounter: Secondary | ICD-10-CM | POA: Diagnosis not present

## 2018-11-11 DIAGNOSIS — Z833 Family history of diabetes mellitus: Secondary | ICD-10-CM

## 2018-11-11 DIAGNOSIS — E785 Hyperlipidemia, unspecified: Secondary | ICD-10-CM | POA: Diagnosis present

## 2018-11-11 DIAGNOSIS — I25810 Atherosclerosis of coronary artery bypass graft(s) without angina pectoris: Secondary | ICD-10-CM | POA: Diagnosis present

## 2018-11-11 DIAGNOSIS — Z85820 Personal history of malignant melanoma of skin: Secondary | ICD-10-CM | POA: Diagnosis not present

## 2018-11-11 DIAGNOSIS — Z20828 Contact with and (suspected) exposure to other viral communicable diseases: Secondary | ICD-10-CM | POA: Diagnosis not present

## 2018-11-11 DIAGNOSIS — Z7902 Long term (current) use of antithrombotics/antiplatelets: Secondary | ICD-10-CM | POA: Diagnosis not present

## 2018-11-11 DIAGNOSIS — R5382 Chronic fatigue, unspecified: Secondary | ICD-10-CM | POA: Diagnosis not present

## 2018-11-11 DIAGNOSIS — Y832 Surgical operation with anastomosis, bypass or graft as the cause of abnormal reaction of the patient, or of later complication, without mention of misadventure at the time of the procedure: Secondary | ICD-10-CM | POA: Diagnosis present

## 2018-11-11 DIAGNOSIS — E1151 Type 2 diabetes mellitus with diabetic peripheral angiopathy without gangrene: Secondary | ICD-10-CM | POA: Diagnosis present

## 2018-11-11 DIAGNOSIS — I25700 Atherosclerosis of coronary artery bypass graft(s), unspecified, with unstable angina pectoris: Secondary | ICD-10-CM | POA: Diagnosis not present

## 2018-11-11 DIAGNOSIS — Z8249 Family history of ischemic heart disease and other diseases of the circulatory system: Secondary | ICD-10-CM

## 2018-11-11 DIAGNOSIS — I495 Sick sinus syndrome: Secondary | ICD-10-CM | POA: Diagnosis not present

## 2018-11-11 DIAGNOSIS — Z7982 Long term (current) use of aspirin: Secondary | ICD-10-CM

## 2018-11-11 DIAGNOSIS — Z87891 Personal history of nicotine dependence: Secondary | ICD-10-CM

## 2018-11-11 DIAGNOSIS — I25710 Atherosclerosis of autologous vein coronary artery bypass graft(s) with unstable angina pectoris: Secondary | ICD-10-CM | POA: Diagnosis not present

## 2018-11-11 DIAGNOSIS — Z888 Allergy status to other drugs, medicaments and biological substances status: Secondary | ICD-10-CM

## 2018-11-11 LAB — COMPREHENSIVE METABOLIC PANEL
ALT: 20 U/L (ref 0–44)
AST: 17 U/L (ref 15–41)
Albumin: 3.8 g/dL (ref 3.5–5.0)
Alkaline Phosphatase: 61 U/L (ref 38–126)
Anion gap: 10 (ref 5–15)
BUN: 21 mg/dL (ref 8–23)
CO2: 23 mmol/L (ref 22–32)
Calcium: 9.3 mg/dL (ref 8.9–10.3)
Chloride: 107 mmol/L (ref 98–111)
Creatinine, Ser: 0.86 mg/dL (ref 0.61–1.24)
GFR calc Af Amer: >60 mL/min (ref >60)
GFR calc non Af Amer: >60 mL/min (ref >60)
Glucose, Bld: 155 mg/dL — ABNORMAL HIGH (ref 70–99)
Potassium: 3.8 mmol/L (ref 3.5–5.1)
Sodium: 140 mmol/L (ref 135–145)
Total Bilirubin: 0.9 mg/dL (ref 0.3–1.2)
Total Protein: 6.6 g/dL (ref 6.5–8.1)

## 2018-11-11 LAB — CBC WITH DIFFERENTIAL/PLATELET
Abs Immature Granulocytes: 0.03 10*3/uL (ref 0.00–0.07)
Basophils Absolute: 0 10*3/uL (ref 0.0–0.1)
Basophils Relative: 0 %
Eosinophils Absolute: 0.1 10*3/uL (ref 0.0–0.5)
Eosinophils Relative: 1 %
HCT: 39.2 % (ref 39.0–52.0)
Hemoglobin: 12.6 g/dL — ABNORMAL LOW (ref 13.0–17.0)
Immature Granulocytes: 0 %
Lymphocytes Relative: 50 %
Lymphs Abs: 5.5 10*3/uL — ABNORMAL HIGH (ref 0.7–4.0)
MCH: 27.7 pg (ref 26.0–34.0)
MCHC: 32.1 g/dL (ref 30.0–36.0)
MCV: 86.2 fL (ref 80.0–100.0)
Monocytes Absolute: 0.8 10*3/uL (ref 0.1–1.0)
Monocytes Relative: 7 %
Neutro Abs: 4.7 10*3/uL (ref 1.7–7.7)
Neutrophils Relative %: 42 %
Platelets: 151 10*3/uL (ref 150–400)
RBC: 4.55 MIL/uL (ref 4.22–5.81)
RDW: 14.5 % (ref 11.5–15.5)
WBC: 11.2 10*3/uL — ABNORMAL HIGH (ref 4.0–10.5)
nRBC: 0 % (ref 0.0–0.2)

## 2018-11-11 LAB — CBC
HCT: 41.6 % (ref 39.0–52.0)
Hemoglobin: 13.5 g/dL (ref 13.0–17.0)
MCH: 28 pg (ref 26.0–34.0)
MCHC: 32.5 g/dL (ref 30.0–36.0)
MCV: 86.1 fL (ref 80.0–100.0)
Platelets: 166 10*3/uL (ref 150–400)
RBC: 4.83 MIL/uL (ref 4.22–5.81)
RDW: 14.5 % (ref 11.5–15.5)
WBC: 12.1 10*3/uL — ABNORMAL HIGH (ref 4.0–10.5)
nRBC: 0 % (ref 0.0–0.2)

## 2018-11-11 LAB — TROPONIN I (HIGH SENSITIVITY)
Troponin I (High Sensitivity): 6 ng/L (ref <18)
Troponin I (High Sensitivity): 8 ng/L (ref <18)

## 2018-11-11 LAB — GLUCOSE, CAPILLARY: Glucose-Capillary: 246 mg/dL — ABNORMAL HIGH (ref 70–99)

## 2018-11-11 LAB — HEMOGLOBIN A1C
Hgb A1c MFr Bld: 8.2 % — ABNORMAL HIGH (ref 4.8–5.6)
Mean Plasma Glucose: 188.64 mg/dL

## 2018-11-11 LAB — SARS CORONAVIRUS 2 BY RT PCR (HOSPITAL ORDER, PERFORMED IN ~~LOC~~ HOSPITAL LAB): SARS Coronavirus 2: NEGATIVE

## 2018-11-11 MED ORDER — METOPROLOL TARTRATE 100 MG PO TABS
100.0000 mg | ORAL_TABLET | Freq: Two times a day (BID) | ORAL | Status: DC
  Administered 2018-11-11 – 2018-11-13 (×3): 100 mg via ORAL
  Filled 2018-11-11: qty 4
  Filled 2018-11-11: qty 1
  Filled 2018-11-11: qty 4
  Filled 2018-11-11: qty 1

## 2018-11-11 MED ORDER — ASPIRIN EC 81 MG PO TBEC
81.0000 mg | DELAYED_RELEASE_TABLET | Freq: Every day | ORAL | Status: DC
  Administered 2018-11-13: 81 mg via ORAL
  Filled 2018-11-11 (×2): qty 1

## 2018-11-11 MED ORDER — HYDRALAZINE HCL 25 MG PO TABS
25.0000 mg | ORAL_TABLET | Freq: Three times a day (TID) | ORAL | Status: DC
  Administered 2018-11-11: 25 mg via ORAL
  Filled 2018-11-11 (×2): qty 1

## 2018-11-11 MED ORDER — TAMSULOSIN HCL 0.4 MG PO CAPS
0.4000 mg | ORAL_CAPSULE | Freq: Every evening | ORAL | Status: DC
  Administered 2018-11-11 – 2018-11-12 (×2): 0.4 mg via ORAL
  Filled 2018-11-11 (×2): qty 1

## 2018-11-11 MED ORDER — NITROGLYCERIN 0.4 MG SL SUBL: 0.4000 mg | SUBLINGUAL_TABLET | SUBLINGUAL | Status: DC | PRN

## 2018-11-11 MED ORDER — ISOSORBIDE MONONITRATE ER 30 MG PO TB24
45.0000 mg | ORAL_TABLET | Freq: Every day | ORAL | Status: DC
  Administered 2018-11-13: 45 mg via ORAL
  Filled 2018-11-11 (×4): qty 2

## 2018-11-11 MED ORDER — ATORVASTATIN CALCIUM 80 MG PO TABS
80.0000 mg | ORAL_TABLET | Freq: Every day | ORAL | Status: DC
  Administered 2018-11-11 – 2018-11-12 (×2): 80 mg via ORAL
  Filled 2018-11-11 (×3): qty 1

## 2018-11-11 MED ORDER — SODIUM CHLORIDE 0.9% FLUSH
3.0000 mL | Freq: Two times a day (BID) | INTRAVENOUS | Status: DC
  Administered 2018-11-11 – 2018-11-12 (×2): 3 mL via INTRAVENOUS

## 2018-11-11 MED ORDER — SODIUM CHLORIDE 0.9 % WEIGHT BASED INFUSION
3.0000 mL/kg/h | INTRAVENOUS | Status: DC
  Administered 2018-11-12: 3 mL/kg/h via INTRAVENOUS

## 2018-11-11 MED ORDER — CLOPIDOGREL BISULFATE 75 MG PO TABS
75.0000 mg | ORAL_TABLET | Freq: Every day | ORAL | Status: DC
  Administered 2018-11-12 – 2018-11-13 (×2): 75 mg via ORAL
  Filled 2018-11-11 (×3): qty 1

## 2018-11-11 MED ORDER — SODIUM CHLORIDE 0.9% FLUSH
3.0000 mL | INTRAVENOUS | Status: DC | PRN
  Administered 2018-11-11: 3 mL via INTRAVENOUS
  Filled 2018-11-11: qty 3

## 2018-11-11 MED ORDER — SODIUM CHLORIDE 0.9 % WEIGHT BASED INFUSION
1.0000 mL/kg/h | INTRAVENOUS | Status: DC
  Administered 2018-11-12: 1 mL/kg/h via INTRAVENOUS

## 2018-11-11 MED ORDER — INSULIN ASPART 100 UNIT/ML ~~LOC~~ SOLN
0.0000 [IU] | Freq: Every day | SUBCUTANEOUS | Status: DC
  Administered 2018-11-11: 2 [IU] via SUBCUTANEOUS

## 2018-11-11 MED ORDER — AMLODIPINE BESYLATE 5 MG PO TABS
10.0000 mg | ORAL_TABLET | Freq: Every day | ORAL | Status: DC
  Filled 2018-11-11 (×2): qty 2

## 2018-11-11 MED ORDER — ASPIRIN 81 MG PO CHEW
81.0000 mg | CHEWABLE_TABLET | ORAL | Status: AC
  Administered 2018-11-12: 81 mg via ORAL
  Filled 2018-11-11: qty 1

## 2018-11-11 MED ORDER — ACETAMINOPHEN 325 MG PO TABS: 650.0000 mg | ORAL_TABLET | ORAL | Status: DC | PRN

## 2018-11-11 MED ORDER — SODIUM CHLORIDE 0.9 % IV SOLN: 250.0000 mL | INTRAVENOUS | Status: DC | PRN

## 2018-11-11 MED ORDER — ENOXAPARIN SODIUM 40 MG/0.4ML ~~LOC~~ SOLN
40.0000 mg | SUBCUTANEOUS | Status: DC
  Administered 2018-11-11 – 2018-11-12 (×2): 40 mg via SUBCUTANEOUS
  Filled 2018-11-11 (×2): qty 0.4

## 2018-11-11 MED ORDER — ONDANSETRON HCL 4 MG/2ML IJ SOLN: 4.0000 mg | Freq: Four times a day (QID) | INTRAMUSCULAR | Status: DC | PRN

## 2018-11-11 MED ORDER — ROPINIROLE HCL 1 MG PO TABS
1.0000 mg | ORAL_TABLET | Freq: Every day | ORAL | Status: DC
  Administered 2018-11-12: 1 mg via ORAL
  Filled 2018-11-11 (×2): qty 1

## 2018-11-11 MED ORDER — INSULIN ASPART 100 UNIT/ML ~~LOC~~ SOLN
0.0000 [IU] | Freq: Three times a day (TID) | SUBCUTANEOUS | Status: DC
  Administered 2018-11-12 (×3): 2 [IU] via SUBCUTANEOUS
  Administered 2018-11-13: 3 [IU] via SUBCUTANEOUS

## 2018-11-11 NOTE — Progress Notes (Signed)
Patient has his home CPAP machine for HS.  Patient is familiar with procedure and equipment and will self administer when ready for sleep.  Patient was instructed to notify RT or RN with and questions or concerns.

## 2018-11-11 NOTE — ED Triage Notes (Signed)
PT reports he has rare CP since DEC 2019 , CP worse over last 3 weeks. Pt"s Card is DR Gwenlyn Found. Pt pain score 2 /10.

## 2018-11-11 NOTE — Telephone Encounter (Signed)
New Message:     Daughter is calling said pt needs to be seen soon. He has an appt on 12-04-18, but he needs to be seen sooner. He is having chest pains with no activity, pain in his shoulder and neck every day.He is  also very fatigued.

## 2018-11-11 NOTE — Telephone Encounter (Signed)
Returned call to daughter she states that he has "little twinges" then these increase thru out the day every day. Pain increases thru the day. She states that it has been happening/increasing for the last few weeks. His pain radiates to his neck, left arm and shoulder and is not alleviated by motion, massaging, etc. She denies any sweating, etc/ She states that this happens daily also. She states that he is sleeping longer daily until about 11am and he states that he is sleeping longer because he does not feel well. She will take him to the ER for evaluation.

## 2018-11-11 NOTE — ED Triage Notes (Signed)
SWAB for covid done

## 2018-11-11 NOTE — H&P (Addendum)
Cardiology Admission History and Physical:   Patient ID: Robert Burns MRN: [id number]; DOB: Jan 21, 1941   Admission date: 11/11/2018  Primary Care Provider: [name], MD Primary Cardiologist: Quay Burow, MD  Primary Electrophysiologist:  None   Chief Complaint: Chest pain  Patient Profile:   Robert Burns is a 78 y.o. male with past medical history significant for CAD s/p CABG 06/2002, PAD s/p left S of a PTA and stenting 2004, sick sinus syndrome status post PPM 2008, OSA on CPAP who presents with chest pain becoming more frequent over the last 3 weeks.  History of Present Illness:   Robert Burns has past medical history as above. He has a history of CAD status post coronary artery bypass grafting March 2004 with a LIMA to his LAD, a vein to a diagonal branch, a sequential vein to a ramus and OM branch, as well as a vein to the PDA. Last functional study performed July 28, 2011, was entirely normal.  He did have cardiac catheterization in 01/2015 revealing high-grade segmental proximal right SVG disease and subtotally occluded diagonal branch SVG.  He underwent stenting of the RCA SVG successfully.  He had staged diagonal branch SVG intervention in 02/2015.   The patient was admitted to the hospital with unstable angina in 01/2018 and underwent cardiac catheterization that revealed disease in all 3 vein grafts as well as in the LAD beyond LIMA insertion.  2 days later he underwent complex intervention on his diagonal and ramus branch vein grafts with restenting as well as intervention on his LAD via LIMA insertion.  His RCA vein graft was not intervened on.  He was last seen via telemedicine visit by Dr. Gwenlyn Found on 08/22/2018 at which time he was not having any anginal symptoms.  He had some nonspecific symptoms of lethargy and fatigue in the morning.  Robert Burns tells me that he began having increased frequency of chest discomfort about 3 weeks ago.  It has become more frequent up to  5-6 times per day, not more intense.  He describes it as feeling like severe heartburn in the central chest, radiating to bilateral neck and occasional to bilateral shoulders.  This lasts for a few minutes.  He has not used nitroglycerin.  He says that he has not been very active over the last 3 weeks and his symptoms do not seem to be related to activity, but he is not sure. He has had no associated shortness of breath, lightheadedness, diaphoresis, palpitations or nausea.  He has had no DOE, orthopnea, PND or edema.  He thinks that this discomfort is reminiscent of the discomfort prior to his CABG surgery.  He reports compliance with all of his cardiac medications.  The patient has not had issues with fatigue and lack of energy.  He reports that he sleeps 10+ hours per night and feels hung over upon awakening.  He says that after his intervention in November he did not have significant improvement in how he felt.  His PCP has made some medication adjustments and he has had adjustment to his CPAP settings, all without significant improvement in his symptoms.  He does report that he is compliant with use of CPAP every night.    Heart Pathway Score:  HEAR Score: 6  Past Medical History:  Diagnosis Date   Arthritis    "all over" (11/25/2015)   CAD (coronary artery disease)    a. s/p CABG  06/2002; b. 02/01/15 PCI: DES to prox SVG to PDA,  staged PCI of SVG to Diag in 02/2015; c. 04/2017 Cath/PCI: LM nl, LAD 100ost, 75m, 75d, LCX 60ost, OM2 80, RCA 100ost, RPDA 80, LIMA->LAD ok, VG->D1 patent stent, VG->RPDA patent stent, 50p, VG->OM1->OM2 90p (3.0x24 Synergy DES), 100 between OM1->OM2 (med rx).   Cancer [facility])    Right Shoulder, Left Leg- BCC, SCC, AND MELANOMA   Hyperlipidemia    Hypertension    Leg pain    OSA on CPAP    PAD (peripheral artery disease) ([place])    a. 10/2002 L SFA PTA/BMS; b. 8/17 LE Angio: LEIA 90 (9x40 self exp stent), LSFA short segment prox occlusion (staged PTA/stenting  01/03/2016), patent mid stent, RSFA 28m (staged PTA/DEB 02/14/2016).   Presence of permanent cardiac pacemaker    SSS (sick sinus syndrome) ([place])    a. s/p PPM in 2007 with gen change 04/2015 - Medtronic Adapta ADDRL1, ser # [id number] H.   Type II diabetes mellitus (HCC)    Urgency of urination     Past Surgical History:  Procedure Laterality Date   CARDIAC CATHETERIZATION N/A 02/01/2015   Procedure: Left Heart Cath and Coronary Angiography;  Surgeon: Lorretta Harp, MD;  Location: [place] [facility];  Service: Cardiovascular;  Laterality: N/A;   CARDIAC CATHETERIZATION N/A 02/01/2015   Procedure: Coronary Stent Intervention;  Surgeon: Lorretta Harp, MD;  Location: [place] [facility];  Service: Cardiovascular;  Laterality: N/A;   CARDIAC CATHETERIZATION  06/2002   "just before bypass OR"   CARDIAC CATHETERIZATION N/A 03/01/2015   Procedure: Coronary Stent Intervention;  Surgeon: Lorretta Harp, MD;  Location: [place] [facility];  Service: Cardiovascular;  Laterality: N/A;   CARDIAC CATHETERIZATION  02/06/2018   CORONARY ANGIOPLASTY     CORONARY ARTERY BYPASS GRAFT  06/2002   x5, LIMA-LAD;VG- Diag; seq VG- ramus & OM branch; VG-PDA   CORONARY STENT INTERVENTION N/A 04/12/2017   Procedure: CORONARY STENT INTERVENTION;  Surgeon: Lorretta Harp, MD;  Location: [place] [facility];  Service: Cardiovascular;  Laterality: N/A;   CORONARY STENT INTERVENTION N/A 02/08/2018   Procedure: CORONARY STENT INTERVENTION;  Surgeon: Jettie Booze, MD;  Location: [place] [facility];  Service: Cardiovascular;  Laterality: N/A;   EP IMPLANTABLE DEVICE N/A 04/23/2015   Procedure: PPM Generator Changeout;  Surgeon: Sanda Klein, MD;  Location: [place] [facility];  Service: Cardiovascular;  Laterality: N/A;   FEMORAL ARTERY STENT Left ~ 2014   "taken out of my leg; couldn' catorgorize what kind so the put it under all 3"; cataroziepitheloid hemanioendotheliomau   FOOT FRACTURE SURGERY  Left [place] / REPLACE / REMOVE PACEMAKER  10/13/05   right side, medtronic Adapta   KNEE HARDWARE REMOVAL Right 1950's   "3-4 months after the insertion"   KNEE SURGERY Right 1950's   "broke my lower leg; had to put pin in my knee to keep lower leg in place til it healed"   LEFT HEART CATH AND CORS/GRAFTS ANGIOGRAPHY N/A 04/12/2017   Procedure: LEFT HEART CATH AND CORS/GRAFTS ANGIOGRAPHY;  Surgeon: Lorretta Harp, MD;  Location: [place] [facility];  Service: Cardiovascular;  Laterality: N/A;   LEFT HEART CATH AND CORS/GRAFTS ANGIOGRAPHY N/A 02/06/2018   Procedure: LEFT HEART CATH AND CORS/GRAFTS ANGIOGRAPHY;  Surgeon: Troy Sine, MD;  Location: [place] [facility];  Service: Cardiovascular;  Laterality: N/A;   PERIPHERAL VASCULAR CATHETERIZATION N/A 11/25/2015   Procedure: Lower Extremity Angiography;  Surgeon: Lorretta Harp, MD;  Location: [place] [facility];  Service: Cardiovascular;  Laterality: N/A;   PERIPHERAL VASCULAR CATHETERIZATION Left 11/25/2015   Procedure: Peripheral Vascular Intervention;  Surgeon: Lorretta Harp, MD;  Location: [place] [facility];  Service: Cardiovascular;  Laterality: Left;  external iliac   PERIPHERAL VASCULAR CATHETERIZATION N/A 01/03/2016   Procedure: Lower Extremity Angiography;  Surgeon: Lorretta Harp, MD;  Location: [place] [facility];  Service: Cardiovascular;  Laterality: N/A;   PERIPHERAL VASCULAR CATHETERIZATION Left 01/03/2016   Procedure: Peripheral Vascular Intervention;  Surgeon: Lorretta Harp, MD;  Location: [place] [facility];  Service: Cardiovascular;  Laterality: Left;  SFA   PERIPHERAL VASCULAR CATHETERIZATION Right 02/14/2016   Procedure: Peripheral Vascular Atherectomy;  Surgeon: Lorretta Harp, MD;  Location: [place] [facility];  Service: Cardiovascular;  Laterality: Right;  SFA   POPLITEAL ARTERY STENT  01/03/2016   Contralateral access with a 7 [place] crossover sheath (second order  catheter placement)   TONSILLECTOMY AND ADENOIDECTOMY     TUMOR EXCISION Right ~ 2005   cancerous tumor removed from shoulder   TUMOR EXCISION Right ~ 2000   benign tumor removed from under shoulder   TUMOR EXCISION Left 06/02/2010   resection of Lt SFA wth interposition of Gore-Tex graft     Medications Prior to Admission: Prior to Admission medications   Medication Sig Start Date End Date Taking? Authorizing Provider  amLODipine (NORVASC) 10 MG tablet Take 10 mg by mouth daily.     [provider]  aspirin 81 MG chewable tablet Chew 1 tablet (81 mg total) by mouth daily. 02/09/18   Eileen Stanford, PA-C  atorvastatin (LIPITOR) 80 MG tablet Take 1 tablet (80 mg total) by mouth daily at 6 PM. 02/09/18   Eileen Stanford, PA-C  Cholecalciferol (VITAMIN D3) 2000 units capsule Take 2,000 Units by mouth 2 (two) times daily.    [provider]  clopidogrel (PLAVIX) 75 MG tablet Take 1 tablet (75 mg total) by mouth daily with breakfast. 02/09/18   Eileen Stanford, PA-C  glipiZIDE (GLUCOTROL) 5 MG tablet Take 5 mg by mouth every evening. 01/10/18   [provider]  hydrALAZINE (APRESOLINE) 25 MG tablet Take 25 mg by mouth 3 (three) times daily.    [provider]  hydrochlorothiazide (HYDRODIURIL) 25 MG tablet Take 25 mg by mouth every morning.    [provider]  isosorbide mononitrate (IMDUR) 30 MG 24 hr tablet Take 1.5 tablets (45 mg total) by mouth daily. 02/18/18 05/08/18  Lendon Colonel, NP  metFORMIN (GLUCOPHAGE) 1000 MG tablet Take 1 tablet (1,000 mg total) by mouth 2 (two) times daily with a meal. 02/10/18   Eileen Stanford, PA-C  metoprolol tartrate (LOPRESSOR) 100 MG tablet Take 1 tablet (100 mg total) by mouth 2 (two) times daily. 08/07/17   Lendon Colonel, NP  nitroGLYCERIN (NITROSTAT) 0.4 MG SL tablet Place 1 tablet (0.4 mg total) under the tongue every 5 (five) minutes as needed for chest pain. 04/15/17   Theora Gianotti, NP  potassium chloride SA (K-DUR,KLOR-CON) 20 MEQ tablet Take 1 tablet (20 mEq total) by mouth daily. Patient not taking: Reported on 08/22/2018 02/18/16   Regalado, Jerald Kief A, MD  rOPINIRole (REQUIP) 1 MG tablet Take 1 mg by mouth at bedtime.    [provider]  sodium chloride (OCEAN) 0.65 % SOLN nasal spray Place 1 spray into both nostrils as needed for congestion.     [provider]  tamsulosin (  FLOMAX) 0.4 MG CAPS capsule Take 0.4 mg by mouth every evening.  03/09/15   [provider]     Allergies:    Allergies  Allergen Reactions   Carvedilol Itching   Peanut-Containing Drug Products Anaphylaxis and Other (See Comments)    Tongue swelling is severe Other reaction(s): Unknown   Ace Inhibitors Other (See Comments), Rash and Hives    Cough Other reaction(s): Unknown Cough    Diltiazem Hcl Other (See Comments)    Unknown Other reaction(s): Other (See Comments), Unknown Unknown    Duloxetine Hcl     Other reaction(s): Other Urinary frequency   Meloxicam Other (See Comments)    GI upset Other reaction(s): Other (See Comments), Unknown GI upset GI upset GI BLEED    Clonidine Hcl Other (See Comments)    Patch only - skin irritation    Social History:   Social History   Socioeconomic History   Marital status: Married    Spouse name: Not on file   Number of children: Not on file   Years of education: Not on file   Highest education level: Not on file  Occupational History   Not on file  Social Needs   Financial resource strain: Not on file   Food insecurity    Worry: Not on file    Inability: Not on file   Transportation needs    Medical: Not on file    Non-medical: Not on file  Tobacco Use   Smoking status: Former Smoker    Years: 10.00    Types: Pipe    Quit date: 1973    Years since quitting: 47.6   Smokeless tobacco: Never Used   Tobacco comment: "quit smoking in 1973"  Substance and  Sexual Activity   Alcohol use: No   Drug use: No   Sexual activity: Not Currently  Lifestyle   Physical activity    Days per week: Not on file    Minutes per session: Not on file   Stress: Not on file  Relationships   Social connections    Talks on phone: Not on file    Gets together: Not on file    Attends religious service: Not on file    Active member of club or organization: Not on file    Attends meetings of clubs or organizations: Not on file    Relationship status: Not on file   Intimate partner violence    Fear of current or ex partner: Not on file    Emotionally abused: Not on file    Physically abused: Not on file    Forced sexual activity: Not on file  Other Topics Concern   Not on file  Social History Narrative   Not on file    Family History:   The patient's family history includes Coronary artery disease in his mother; Diabetes in his father and sister; Heart attack in his father and mother; Heart disease in his father, mother, and sister; Hyperlipidemia in his father; Hypertension in his mother and sister.    ROS:  Please see the history of present illness.  All other ROS reviewed and negative.     Physical Exam/Data:   Vitals:   11/11/18 1300 11/11/18 1330 11/11/18 1345 11/11/18 1400  BP: (!) 160/69 (!) 177/70 (!) 157/74 (!) 168/79  Pulse: 63 60 (!) 59 69  Resp: 19 19 15 20   Temp:      TempSrc:      SpO2: 97% 95% 96% 96%  Weight:      Height:       No intake or output data in the 24 hours ending 11/11/18 1426 Last 3 Weights 11/11/2018 08/22/2018 05/08/2018  Weight (lbs) 169 lb 166 lb 170 lb  Weight (kg) 76.658 kg 75.297 kg 77.111 kg     Body mass index is 26.47 kg/m.  General:  Well nourished, well developed, in no acute distress HEENT: normal Lymph: no adenopathy Neck: no JVD Endocrine:  No thryomegaly Vascular: No carotid bruits; FA pulses 2+ bilaterally without bruits  Cardiac:  normal S1, S2; RRR; no murmur  Lungs:  clear to  auscultation bilaterally, no wheezing, rhonchi or rales  Abd: soft, nontender, no hepatomegaly  Ext: no edema Musculoskeletal:  No deformities, BUE and BLE strength normal and equal Skin: warm and dry  Neuro:  CNs 2-12 intact, no focal abnormalities noted Psych:  Normal affect    EKG:  The ECG that was done and was personally reviewed and demonstrates atrial pacing, 66 bpm, RBBB, PVCs, possible atrial pacing spikes-small.   Relevant CV Studies:  CORONARY STENT INTERVENTION 02/08/2018  Conclusion   Prox Graft lesion is 95% stenosed; SVG to Diagonal. Treated with cutting balloon followed by 3.25 Hettinger balloon.  Post intervention, there is a 0% residual stenosis.  Origin to Mid Graft lesion of SVG to OM is 85% stenosed.  Prox Graft to Dist Graft lesion between Ramus and 2nd Mrg is 100% stenosed.  Balloon angioplasty was performed using a BALLOON SAPPHIRE [place] 3.5X15. Not much territory supplied, causing slower flow.  Post intervention, there is a 0% residual stenosis.  Dist LAD lesion is 90% stenosed.  A drug-eluting stent was successfully placed using a STENT RESOLUTE ONYX 2.25X18.  Post intervention, there is a 0% residual stenosis.  Severe Mid LAD-1 lesion just after anastamosis.  A drug-eluting stent was successfully placed using a [place] 2.25X15.  Post intervention, there is a 0% residual stenosis. Recommend uninterrupted dual antiplatelet therapy with Aspirin 81mg  daily and Clopidogrel 75mg  daily for a minimum of 6 months (stable ischemic heart disease - Class I recommendation).   Continue aggressive medical therapy.  F/u w/ Dr.  Gwenlyn Found.  Could consider PCI to SVG to RCA at a later time.     Echocardiogram 02/07/2018 Study Conclusions  - Left ventricle: Abnormal septal motion and posterior basal   hypokinesis. The cavity size was mildly dilated. Wall thickness   was normal. The estimated ejection fraction was 55%. Left   ventricular diastolic function  parameters were normal. - Left atrium: The atrium was moderately dilated. - Right ventricle: The cavity size was mildly dilated. - Right atrium: The atrium was mildly dilated.   Laboratory Data:  High Sensitivity Troponin:   Recent Labs  Lab 11/11/18 1301  TROPONINIHS 6      Cardiac EnzymesNo results for input(s): TROPONINI in the last 168 hours. No results for input(s): TROPIPOC in the last 168 hours.  Chemistry Recent Labs  Lab 11/11/18 1213  NA 140  K 3.8  CL 107  CO2 23  GLUCOSE 155*  BUN 21  CREATININE 0.86  CALCIUM 9.3  GFRNONAA >60  GFRAA >60  ANIONGAP 10    Recent Labs  Lab 11/11/18 1213  PROT 6.6  ALBUMIN 3.8  AST 17  ALT 20  ALKPHOS 61  BILITOT 0.9   Hematology Recent Labs  Lab 11/11/18 1213  WBC 11.2*  RBC 4.55  HGB 12.6*  HCT 39.2  MCV 86.2  MCH 27.7  MCHC 32.1  RDW 14.5  PLT 151   BNPNo results for input(s): BNP, PROBNP in the last 168 hours.  DDimer No results for input(s): DDIMER in the last 168 hours.   Radiology/Studies:  Dg Chest 2 View  Result Date: 11/11/2018 CLINICAL DATA:  Chest pain worsening for several weeks, diabetes mellitus, hypertension, coronary artery disease post stenting and CABG, former smoker EXAM: CHEST - 2 VIEW COMPARISON:  03/29/2017 FINDINGS: RIGHT subclavian pacemaker with leads projecting at RIGHT atrium and RIGHT ventricle. Normal heart size post CABG. Mediastinal contours and pulmonary vascularity normal. Atherosclerotic calcification aorta. Lungs clear. No acute infiltrate, pleural effusion or pneumothorax. Calcified granuloma LEFT mid lung. Bones demineralized. IMPRESSION: Post CABG. No acute abnormalities. Electronically Signed   By: Lavonia Dana M.D.   On: 11/11/2018 13:18    Assessment and Plan:   Chest pain -Presented with worsening intermittent central chest pain described as severe heartburn, radiating to the neck and occasionally to his shoulders.  No associated shortness of breath or other  symptoms.  This is reminiscent of his symptoms prior to CABG.  It is now occurring up to 4-5 times per day, lasting for several minutes. -Renal function normal with serum creatinine 0.86.  Potassium 3.8.  Hemoglobin 12.6. -EKG without acute ischemic changes.  RBBB appears to have been intermittently present in the past. -Initial high-sensitivity troponin of 6, not elevated.  -Chest x-ray with no acute abnormalities. -Patient with complex CAD history.  Last intervention 02/08/2018 indicated that PCI to SVG to RCA could be considered at some point for ischemic symptoms. -Currently no evidence of acute coronary syndrome, however, increasing frequency of symptoms is worrisome for myocardial ischemia. -We will admit patient to the hospital.  Continue to trend troponins.  Will likely plan for cardiac catheterization tomorrow to further evaluate his coronary arteries and bypass grafts.  He may be able to have intervention to his RCA vein graft. -Pt will need COVID testing prior to cath. He has no COVID type symptoms.   CAD, complex history -History of CABG 06/2002.  Cardiac cath 2016 revealed high-grade segmental proximal right SVG disease and subtotally occluded diagonal branch vein graft.  He underwent successful stenting of his RCA vein graft.  He had staged intervention to the diagonal branch SVG in 02/2015.  Cardiac cath in 01/2018 revealed disease in all 3 vein grafts as well as LAD beyond LIMA insertion.  2 days later he underwent complex intervention of his diagonal and ramus branch vein grafts with restenting as well as intervention on his LAD via the LIMA.  His RCA vein graft was not intervened on. -Medically treated with aspirin 81 mg, Plavix 75 mg, high intensity statin, beta-blocker and long-acting nitroglycerin with Imdur 45 mg daily  Essential hypertension -On amlodipine, hydralazine, hydrochlorothiazide and metoprolol. -Blood pressure is currently elevated.  He has had none of his medications  today. -We will restart home medications.  Hyperlipidemia with goal LDL <70 -On high intensity statin with atorvastatin 80 mg daily -Lipid profile in 04/2018 revealed total cholesterol 78, LDL 33 and HDL 31.  Sick sinus syndrome status post PPM -Followed by Dr. Sallyanne Kuster -Medtronic pacemaker generator change done in January 2020 -Per last interrogation, 98.9% paced, ventricular sensed.  Peripheral artery disease -Followed by Dr. Gwenlyn Found, s/p past interventions. No significant leg claudication.   OSA on CPAP -Compliant with nightly use. -We will order CPAP in the hospital.  DM type 2 -Treated with glipizide, metformin.  Will hold oral medications and utilize sliding scale  insulin while hospitalized.  Severity of Illness: The appropriate patient status for this patient is INPATIENT. Inpatient status is judged to be reasonable and necessary in order to provide the required intensity of service to ensure the patient's safety. The patient's presenting symptoms, physical exam findings, and initial radiographic and laboratory data in the context of their chronic comorbidities is felt to place them at high risk for further clinical deterioration. Furthermore, it is not anticipated that the patient will be medically stable for discharge from the hospital within 2 midnights of admission. The following factors support the patient status of inpatient.   " The patient's presenting symptoms include chest pain with increasing frequency. " The worrisome physical exam findings include none. " The initial radiographic and laboratory data are worrisome because of none. " The chronic co-morbidities include complex history of CAD with CABG and failed grafts, multiple interventions.   * I certify that at the point of admission it is my clinical judgment that the patient will require inpatient hospital care spanning beyond 2 midnights from the point of admission due to high intensity of service, high risk for  further deterioration and high frequency of surveillance required.*    For questions or updates, please contact [place] Please consult [email or web address] for contact info under        Signed, Daune Perch, NP  11/11/2018 2:26 PM   I have examined the patient and reviewed assessment and plan and discussed with patient.  Agree with above as stated.     Some nonspecific sx like fatigue.  He has not been active.  He has tried changing meds with PMD with no improvement.  CPAP was increased but has not helped. He did not have any improvement after PCI in 11/19.  Now with chest pain that is similar in character to his pain before CABG.  At time of CABG, it was exertional.  Now it is at rest, lasting up to 5 minutes but not severe enough to require SL NTG.  Will plan for cath in AM.  All questions about cath explained to the patient.  COVID testing not done so will plan for cath in AM.   Long term, he needs to be more active. We talked about trying to get 150 minutes of exercise / week.   Larae Grooms

## 2018-11-11 NOTE — Plan of Care (Signed)
  Problem: Education: Goal: Understanding of CV disease, CV risk reduction, and recovery process will improve Outcome: Progressing Goal: Individualized Educational Video(s) Outcome: Progressing   Problem: Activity: Goal: Ability to return to baseline activity level will improve Outcome: Progressing   Problem: Cardiovascular: Goal: Ability to achieve and maintain adequate cardiovascular perfusion will improve Outcome: Progressing Goal: Vascular access site(s) Level 0-1 will be maintained Outcome: Progressing   Problem: Health Behavior/Discharge Planning: Goal: Ability to safely manage health-related needs after discharge will improve Outcome: Progressing   Problem: Education: Goal: Ability to demonstrate management of disease process will improve Outcome: Progressing Goal: Ability to verbalize understanding of medication therapies will improve Outcome: Progressing Goal: Individualized Educational Video(s) Outcome: Progressing   Problem: Activity: Goal: Capacity to carry out activities will improve Outcome: Progressing   Problem: Cardiac: Goal: Ability to achieve and maintain adequate cardiopulmonary perfusion will improve Outcome: Progressing   Problem: Education: Goal: Knowledge of General Education information will improve Description: Including pain rating scale, medication(s)/side effects and non-pharmacologic comfort measures Outcome: Progressing   Problem: Health Behavior/Discharge Planning: Goal: Ability to manage health-related needs will improve Outcome: Progressing   Problem: Clinical Measurements: Goal: Ability to maintain clinical measurements within normal limits will improve Outcome: Progressing Goal: Will remain free from infection Outcome: Progressing Goal: Diagnostic test results will improve Outcome: Progressing Goal: Respiratory complications will improve Outcome: Progressing Goal: Cardiovascular complication will be avoided Outcome:  Progressing   Problem: Activity: Goal: Risk for activity intolerance will decrease Outcome: Progressing   Problem: Nutrition: Goal: Adequate nutrition will be maintained Outcome: Progressing   Problem: Coping: Goal: Level of anxiety will decrease Outcome: Progressing   Problem: Elimination: Goal: Will not experience complications related to bowel motility Outcome: Progressing Goal: Will not experience complications related to urinary retention Outcome: Progressing   Problem: Pain Managment: Goal: General experience of comfort will improve Outcome: Progressing   Problem: Safety: Goal: Ability to remain free from injury will improve Outcome: Progressing   Problem: Skin Integrity: Goal: Risk for impaired skin integrity will decrease Outcome: Progressing

## 2018-11-11 NOTE — ED Provider Notes (Signed)
[facility] EMERGENCY DEPARTMENT Provider Note   CSN: [id number] Arrival date & time: 11/11/18  1138    History   Chief Complaint Chief Complaint  Patient presents with  . Chest Pain    HPI Robert Burns is a 78 y.o. male with PMH/o CAD, CABG (2004), PCI (2006), HLD, HTN, PAD, SSS, DM II who presents today for evaluation of progressively worsening intermittent chest pain.  He states that normally, he would get intermittent "twinges" of chest pain that would occur for a few seconds and then resolved.  He states that most the time this occurred when he was exerting himself and has been his norm.  He reports that over the last 3 weeks, these episodes of chest pain have become more severe and more frequent.  He states now, they occur when he is at rest also.  He states that they are not brought on by exertion but occur throughout the day at random times.  He states that it was not strong enough for him to take a nitro.  He denies any associated shortness of breath, diaphoresis, nausea/vomiting.  He states that this pain was not worse with deep inspiration or movement/bending.  He states that the "twinges" of pain became more severe and began up into his neck and to his upper extremities.  He called his doctor and was going to be evaluated in August but felt like they were becoming more frequent and needed emergent evaluation.  He currently denies any chest pain at this time.  He also endorses being more tired and when he wakes up, feeling like he is "drunk and very tired."  He states that this is been an ongoing issue for about the last 10 months.  He states he has not had any fevers, shortness of breath, cough, abdominal pain, numbness/weakness of his extremities.  He denies any recent travel or known COVID-19 exposure.  He has been trying to isolate.  He is not a current smoker.  He does have history of diabetes, hypertension, hyperlipidemia as well as significant cardio history.   Cardiology: Gwenlyn Found   HPI: A 78 year old patient with a history of peripheral artery disease, treated diabetes, hypertension and hypercholesterolemia presents for evaluation of chest pain. Initial onset of pain was approximately 1-3 hours ago. The patient's chest pain is well-localized, is described as heaviness/pressure/tightness and is not worse with exertion. The patient's chest pain is middle- or left-sided, is not sharp and does radiate to the arms/jaw/neck. The patient does not complain of nausea and denies diaphoresis. The patient has no history of stroke, has not smoked in the past 90 days, has no relevant family history of coronary artery disease (first degree relative at less than age 58) and does not have an elevated BMI (>=30).   The history is provided by the patient.    Past Medical History:  Diagnosis Date  . Arthritis    "all over" (11/25/2015)  . CAD (coronary artery disease)    a. s/p CABG  06/2002; b. 02/01/15 PCI: DES to prox SVG to PDA, staged PCI of SVG to Diag in 02/2015; c. 04/2017 Cath/PCI: LM nl, LAD 100ost, 33m, 75d, LCX 60ost, OM2 80, RCA 100ost, RPDA 80, LIMA->LAD ok, VG->D1 patent stent, VG->RPDA patent stent, 50p, VG->OM1->OM2 90p (3.0x24 Synergy DES), 100 between OM1->OM2 (med rx).  . Cancer ([place])    Right Shoulder, Left Leg- BCC, SCC, AND MELANOMA  . Hyperlipidemia   . Hypertension   . Leg pain   .  OSA on CPAP   . PAD (peripheral artery disease) ([place])    a. 10/2002 L SFA PTA/BMS; b. 8/17 LE Angio: LEIA 90 (9x40 self exp stent), LSFA short segment prox occlusion (staged PTA/stenting 01/03/2016), patent mid stent, RSFA 47m (staged PTA/DEB 02/14/2016).  . Presence of permanent cardiac pacemaker   . SSS (sick sinus syndrome) ([place])    a. s/p PPM in 2007 with gen change 04/2015 - Medtronic Adapta ADDRL1, ser # [id number].  . Type II diabetes mellitus ([place])   . Urgency of urination     Patient Active Problem List   Diagnosis Date Noted  . Unstable angina ([place])  11/11/2018  . Hypercholesterolemia 02/20/2018  . Paroxysmal atrial fibrillation ([place]) 02/20/2018  . CAD (coronary artery disease) of bypass graft 02/06/2018  . CAD, multiple vessel 02/06/2018  . Leukocytosis 02/16/2016  . Peripheral arterial disease ([place])   . Pacemaker 04/23/2015  . Positive cardiac stress test   . OSA on CPAP 11/04/2012  . SSS (sick sinus syndrome), medtronic adapta   . Hyperlipidemia due to type 2 diabetes mellitus ([place])   . Superficial femoral artery injury 05/09/2011  . SPRAIN&STRAIN OTHER SPECIFIED SITES KNEE&LEG 01/31/2010  . Type 2 diabetes mellitus with complication, with long-term current use of insulin ([place]) 11/26/2006  . Essential hypertension 11/26/2006    Past Surgical History:  Procedure Laterality Date  . CARDIAC CATHETERIZATION N/A 02/01/2015   Procedure: Left Heart Cath and Coronary Angiography;  Surgeon: Lorretta Harp, MD;  Location: [facility];  Service: Cardiovascular;  Laterality: N/A;  . CARDIAC CATHETERIZATION N/A 02/01/2015   Procedure: Coronary Stent Intervention;  Surgeon: Lorretta Harp, MD;  Location: [facility];  Service: Cardiovascular;  Laterality: N/A;  . CARDIAC CATHETERIZATION  06/2002   "just before bypass OR"  . CARDIAC CATHETERIZATION N/A 03/01/2015   Procedure: Coronary Stent Intervention;  Surgeon: Lorretta Harp, MD;  Location: [facility];  Service: Cardiovascular;  Laterality: N/A;  . CARDIAC CATHETERIZATION  02/06/2018  . CORONARY ANGIOPLASTY    . CORONARY ARTERY BYPASS GRAFT  06/2002   x5, LIMA-LAD;VG- Diag; seq VG- ramus & OM branch; VG-PDA  . CORONARY STENT INTERVENTION N/A 04/12/2017   Procedure: CORONARY STENT INTERVENTION;  Surgeon: Lorretta Harp, MD;  Location: [facility];  Service: Cardiovascular;  Laterality: N/A;  . CORONARY STENT INTERVENTION N/A 02/08/2018   Procedure: CORONARY STENT INTERVENTION;  Surgeon: Jettie Booze, MD;  Location: [facility];  Service:  Cardiovascular;  Laterality: N/A;  . EP IMPLANTABLE DEVICE N/A 04/23/2015   Procedure: PPM Generator Changeout;  Surgeon: Sanda Klein, MD;  Location: [facility];  Service: Cardiovascular;  Laterality: N/A;  . FEMORAL ARTERY STENT Left ~ 2014   "taken out of my leg; couldn' catorgorize what kind so the put it under all 3"; cataroziepitheloid hemanioendotheliomau  . FOOT FRACTURE SURGERY Left 1973  . FRACTURE SURGERY    . INSERT / REPLACE / REMOVE PACEMAKER  10/13/05   right side, medtronic Adapta  . KNEE HARDWARE REMOVAL Right 1950's   "3-4 months after the insertion"  . KNEE SURGERY Right 1950's   "broke my lower leg; had to put pin in my knee to keep lower leg in place til it healed"  . LEFT HEART CATH AND CORS/GRAFTS ANGIOGRAPHY N/A 04/12/2017   Procedure: LEFT HEART CATH AND CORS/GRAFTS ANGIOGRAPHY;  Surgeon: Lorretta Harp, MD;  Location: [facility];  Service: Cardiovascular;  Laterality: N/A;  .  LEFT HEART CATH AND CORS/GRAFTS ANGIOGRAPHY N/A 02/06/2018   Procedure: LEFT HEART CATH AND CORS/GRAFTS ANGIOGRAPHY;  Surgeon: Troy Sine, MD;  Location: [facility];  Service: Cardiovascular;  Laterality: N/A;  . PERIPHERAL VASCULAR CATHETERIZATION N/A 11/25/2015   Procedure: Lower Extremity Angiography;  Surgeon: Lorretta Harp, MD;  Location: [facility];  Service: Cardiovascular;  Laterality: N/A;  . PERIPHERAL VASCULAR CATHETERIZATION Left 11/25/2015   Procedure: Peripheral Vascular Intervention;  Surgeon: Lorretta Harp, MD;  Location: [facility];  Service: Cardiovascular;  Laterality: Left;  external iliac  . PERIPHERAL VASCULAR CATHETERIZATION N/A 01/03/2016   Procedure: Lower Extremity Angiography;  Surgeon: Lorretta Harp, MD;  Location: [facility];  Service: Cardiovascular;  Laterality: N/A;  . PERIPHERAL VASCULAR CATHETERIZATION Left 01/03/2016   Procedure: Peripheral Vascular Intervention;  Surgeon: Lorretta Harp, MD;   Location: [facility];  Service: Cardiovascular;  Laterality: Left;  SFA  . PERIPHERAL VASCULAR CATHETERIZATION Right 02/14/2016   Procedure: Peripheral Vascular Atherectomy;  Surgeon: Lorretta Harp, MD;  Location: [facility];  Service: Cardiovascular;  Laterality: Right;  SFA  . POPLITEAL ARTERY STENT  01/03/2016   Contralateral access with a 7 French crossover sheath (second order catheter placement)  . TONSILLECTOMY AND ADENOIDECTOMY    . TUMOR EXCISION Right ~ 2005   cancerous tumor removed from shoulder  . TUMOR EXCISION Right ~ 2000   benign tumor removed from under shoulder  . TUMOR EXCISION Left 06/02/2010   resection of Lt SFA wth interposition of Gore-Tex graft        Home Medications    Prior to Admission medications   Medication Sig Start Date End Date Taking? Authorizing Provider  amLODipine (NORVASC) 10 MG tablet Take 10 mg by mouth daily.    Yes [provider]  aspirin 81 MG chewable tablet Chew 1 tablet (81 mg total) by mouth daily. 02/09/18  Yes Eileen Stanford, PA-C  atorvastatin (LIPITOR) 80 MG tablet Take 1 tablet (80 mg total) by mouth daily at 6 PM. 02/09/18  Yes Eileen Stanford, PA-C  Cholecalciferol (VITAMIN D3) 2000 units capsule Take 2,000 Units by mouth 2 (two) times daily.   Yes [provider]  clopidogrel (PLAVIX) 75 MG tablet Take 1 tablet (75 mg total) by mouth daily with breakfast. 02/09/18  Yes Eileen Stanford, PA-C  glipiZIDE (GLUCOTROL) 5 MG tablet Take 5 mg by mouth every evening. 01/10/18  Yes [provider]  hydrALAZINE (APRESOLINE) 25 MG tablet Take 25 mg by mouth 2 (two) times daily.    Yes [provider]  hydrochlorothiazide (HYDRODIURIL) 25 MG tablet Take 25 mg by mouth every morning.   Yes [provider]  isosorbide mononitrate (IMDUR) 30 MG 24 hr tablet Take 1.5 tablets (45 mg total) by mouth daily. 02/18/18 11/11/18 Yes Lendon Colonel, NP  metFORMIN (GLUCOPHAGE)  1000 MG tablet Take 1 tablet (1,000 mg total) by mouth 2 (two) times daily with a meal. 02/10/18  Yes Eileen Stanford, PA-C  metoprolol tartrate (LOPRESSOR) 100 MG tablet Take 1 tablet (100 mg total) by mouth 2 (two) times daily. 08/07/17  Yes Lendon Colonel, NP  nitroGLYCERIN (NITROSTAT) 0.4 MG SL tablet Place 1 tablet (0.4 mg total) under the tongue every 5 (five) minutes as needed for chest pain. 04/15/17  Yes Theora Gianotti, NP  potassium chloride SA (K-DUR,KLOR-CON) 20 MEQ tablet Take 1 tablet (20 mEq total) by mouth daily.  02/18/16  Yes Regalado, Belkys A, MD  tamsulosin (FLOMAX) 0.4 MG CAPS capsule Take 0.4 mg by mouth every evening.  03/09/15  Yes [provider]    Family History Family History  Problem Relation Age of Onset  . Coronary artery disease Mother   . Heart attack Mother   . Hypertension Mother   . Heart disease Mother        Open  Heart surgery  . Diabetes Father   . Heart disease Father   . Hyperlipidemia Father   . Heart attack Father   . Hypertension Sister   . Diabetes Sister   . Heart disease Sister     Social History Social History   Tobacco Use  . Smoking status: Former Smoker    Years: 10.00    Types: Pipe    Quit date: 1973    Years since quitting: 47.6  . Smokeless tobacco: Never Used  . Tobacco comment: "quit smoking in 1973"  Substance Use Topics  . Alcohol use: No  . Drug use: No     Allergies   Carvedilol, Peanut-containing drug products, Ace inhibitors, Diltiazem hcl, Duloxetine hcl, Meloxicam, and Clonidine hcl   Review of Systems Review of Systems  Constitutional: Positive for fatigue. Negative for fever.  Respiratory: Negative for cough and shortness of breath.   Cardiovascular: Positive for chest pain.  Gastrointestinal: Negative for abdominal pain, nausea and vomiting.  Genitourinary: Negative for dysuria and hematuria.  Neurological: Negative for weakness and headaches.  All other systems reviewed  and are negative.    Physical Exam Updated Vital Signs BP (!) 167/92   Pulse 68   Temp 97.8 F (36.6 C) (Oral)   Resp (!) 21   Ht 5\' 7"  (1.702 m)   Wt 76.7 kg   SpO2 97%   BMI 26.47 kg/m   Physical Exam Vitals signs and nursing note reviewed.  Constitutional:      Appearance: Normal appearance. He is well-developed.  HENT:     Head: Normocephalic and atraumatic.  Eyes:     General: Lids are normal.     Conjunctiva/sclera: Conjunctivae normal.     Pupils: Pupils are equal, round, and reactive to light.  Neck:     Musculoskeletal: Full passive range of motion without pain.  Cardiovascular:     Rate and Rhythm: Normal rate and regular rhythm.     Pulses: Normal pulses.          Radial pulses are 2+ on the right side and 2+ on the left side.       Dorsalis pedis pulses are 2+ on the right side and 2+ on the left side.     Heart sounds: Normal heart sounds. No murmur. No friction rub. No gallop.   Pulmonary:     Effort: Pulmonary effort is normal.     Breath sounds: Normal breath sounds.     Comments: Lungs clear to auscultation bilaterally.  Symmetric chest rise.  No wheezing, rales, rhonchi. Abdominal:     Palpations: Abdomen is soft. Abdomen is not rigid.     Tenderness: There is no abdominal tenderness. There is no guarding.  Musculoskeletal: Normal range of motion.  Skin:    General: Skin is warm and dry.     Capillary Refill: Capillary refill takes less than 2 seconds.  Neurological:     Mental Status: He is alert and oriented to person, place, and time.  Psychiatric:        Speech: Speech normal.  ED Treatments / Results  Labs (all labs ordered are listed, but only abnormal results are displayed) Labs Reviewed  CBC WITH DIFFERENTIAL/PLATELET - Abnormal; Notable for the following components:      Result Value   WBC 11.2 (*)    Hemoglobin 12.6 (*)    Lymphs Abs 5.5 (*)    All other components within normal limits  COMPREHENSIVE METABOLIC PANEL -  Abnormal; Notable for the following components:   Glucose, Bld 155 (*)    All other components within normal limits  HEMOGLOBIN A1C - Abnormal; Notable for the following components:   Hgb A1c MFr Bld 8.2 (*)    All other components within normal limits  SARS CORONAVIRUS 2 (HOSPITAL ORDER, [facility])  CBC  BASIC METABOLIC PANEL  TROPONIN I (HIGH SENSITIVITY)  TROPONIN I (HIGH SENSITIVITY)    EKG EKG Interpretation  Date/Time:  Monday November 11 2018 11:54:40 EDT Ventricular Rate:  66 PR Interval:    QRS Duration: 126 QT Interval:  446 QTC Calculation: 468 R Axis:   87 Text Interpretation:  Sinus rhythm Ventricular premature complex , new since last tracing Right bundle branch block Borderline ST elevation Confirmed by Dorie Rank [phone]) on 11/11/2018 11:57:44 AM   Radiology Dg Chest 2 View  Result Date: 11/11/2018 CLINICAL DATA:  Chest pain worsening for several weeks, diabetes mellitus, hypertension, coronary artery disease post stenting and CABG, former smoker EXAM: CHEST - 2 VIEW COMPARISON:  03/29/2017 FINDINGS: RIGHT subclavian pacemaker with leads projecting at RIGHT atrium and RIGHT ventricle. Normal heart size post CABG. Mediastinal contours and pulmonary vascularity normal. Atherosclerotic calcification aorta. Lungs clear. No acute infiltrate, pleural effusion or pneumothorax. Calcified granuloma LEFT mid lung. Bones demineralized. IMPRESSION: Post CABG. No acute abnormalities. Electronically Signed   By: Lavonia Dana M.D.   On: 11/11/2018 13:18    Procedures Procedures (including critical care time)  Medications Ordered in ED Medications  amLODipine (NORVASC) tablet 10 mg (has no administration in time range)  atorvastatin (LIPITOR) tablet 80 mg (has no administration in time range)  hydrALAZINE (APRESOLINE) tablet 25 mg (has no administration in time range)  metoprolol tartrate (LOPRESSOR) tablet 100 mg (has no administration in time range)   isosorbide mononitrate (IMDUR) 24 hr tablet 45 mg (has no administration in time range)  tamsulosin (FLOMAX) capsule 0.4 mg (has no administration in time range)  clopidogrel (PLAVIX) tablet 75 mg (has no administration in time range)  rOPINIRole (REQUIP) tablet 1 mg (has no administration in time range)  aspirin EC tablet 81 mg (has no administration in time range)  nitroGLYCERIN (NITROSTAT) SL tablet 0.4 mg (has no administration in time range)  acetaminophen (TYLENOL) tablet 650 mg (has no administration in time range)  ondansetron (ZOFRAN) injection 4 mg (has no administration in time range)  insulin aspart (novoLOG) injection 0-9 Units (has no administration in time range)  insulin aspart (novoLOG) injection 0-5 Units (has no administration in time range)  enoxaparin (LOVENOX) injection 40 mg (has no administration in time range)  sodium chloride flush (NS) 0.9 % injection 3 mL (has no administration in time range)     Initial Impression / Assessment and Plan / ED Course  I have reviewed the triage vital signs and the nursing notes.  Pertinent labs & imaging results that were available during my care of the patient were reviewed by me and considered in my medical decision making (see chart for details).     HEAR Score: 3  78 year old  male past medical 3 of CAD, CABG, diabetes, hypertension, hyperlipidemia who presents for evaluation of progressively worsening intermittent chest pain.  Occurs now with rest. Patient is afebrile, non-toxic appearing, sitting comfortably on examination table. Vital signs reviewed and stable.  Consider ACS versus unstable angina.  In for labs, EKG, chest x-ray.  Initial troponin is 6.  CBC shows slight leukocytosis of 11.2.  Hemoglobin 12.6.  CMP is unremarkable.  Given his history and risk factors, he has a heart score of 6.  We will plan to consult cardiology for possible admission/consultation.  Discussed patient with Angie (Cardiology). Plan for  consult.   Cardiology will admit.   Portions of this note were generated with [employer]. Dictation errors may occur despite best attempts at proofreading.   Final Clinical Impressions(s) / ED Diagnoses   Final diagnoses:  Unstable angina [facility])    ED Discharge Orders    None       Desma Mcgregor 11/11/18 1711    Dorie Rank, MD 11/12/18 [phone]

## 2018-11-12 ENCOUNTER — Encounter (HOSPITAL_COMMUNITY): Payer: Self-pay | Admitting: Cardiovascular Disease

## 2018-11-12 ENCOUNTER — Encounter (HOSPITAL_COMMUNITY)
Admission: EM | Disposition: A | Discharge status: Home / Self Care | Payer: Self-pay | Source: Home / Self Care | Attending: Interventional Cardiology

## 2018-11-12 DIAGNOSIS — I25710 Atherosclerosis of autologous vein coronary artery bypass graft(s) with unstable angina pectoris: Secondary | ICD-10-CM

## 2018-11-12 DIAGNOSIS — I200 Unstable angina: Secondary | ICD-10-CM

## 2018-11-12 HISTORY — PX: LEFT HEART CATH AND CORS/GRAFTS ANGIOGRAPHY: CATH118250

## 2018-11-12 HISTORY — PX: CORONARY STENT INTERVENTION: CATH118234

## 2018-11-12 LAB — GLUCOSE, CAPILLARY
Glucose-Capillary: 153 mg/dL — ABNORMAL HIGH (ref 70–99)
Glucose-Capillary: 151 mg/dL — ABNORMAL HIGH (ref 70–99)
Glucose-Capillary: 177 mg/dL — ABNORMAL HIGH (ref 70–99)
Glucose-Capillary: 137 mg/dL — ABNORMAL HIGH (ref 70–99)

## 2018-11-12 LAB — BASIC METABOLIC PANEL
Anion gap: 9 (ref 5–15)
BUN: 20 mg/dL (ref 8–23)
CO2: 28 mmol/L (ref 22–32)
Calcium: 9.5 mg/dL (ref 8.9–10.3)
Chloride: 103 mmol/L (ref 98–111)
Creatinine, Ser: 0.98 mg/dL (ref 0.61–1.24)
GFR calc Af Amer: >60 mL/min (ref >60)
GFR calc non Af Amer: >60 mL/min (ref >60)
Glucose, Bld: 197 mg/dL — ABNORMAL HIGH (ref 70–99)
Potassium: 4.2 mmol/L (ref 3.5–5.1)
Sodium: 140 mmol/L (ref 135–145)

## 2018-11-12 LAB — POCT ACTIVATED CLOTTING TIME
Activated Clotting Time: 274 seconds
Activated Clotting Time: 257 seconds

## 2018-11-12 SURGERY — LEFT HEART CATH AND CORS/GRAFTS ANGIOGRAPHY
Anesthesia: LOCAL

## 2018-11-12 MED ORDER — HYDRALAZINE HCL 20 MG/ML IJ SOLN: 10.0000 mg | INTRAMUSCULAR | Status: AC | PRN

## 2018-11-12 MED ORDER — HEPARIN (PORCINE) IN NACL 1000-0.9 UT/500ML-% IV SOLN
INTRAVENOUS | Status: AC
  Filled 2018-11-12: qty 1000

## 2018-11-12 MED ORDER — VERAPAMIL HCL 2.5 MG/ML IV SOLN
INTRAVENOUS | Status: AC
  Filled 2018-11-12: qty 2

## 2018-11-12 MED ORDER — MIDAZOLAM HCL 2 MG/2ML IJ SOLN
INTRAMUSCULAR | Status: AC
  Filled 2018-11-12: qty 2

## 2018-11-12 MED ORDER — MIDAZOLAM HCL 2 MG/2ML IJ SOLN
INTRAMUSCULAR | Status: DC | PRN
  Administered 2018-11-12: 0.5 mg via INTRAVENOUS

## 2018-11-12 MED ORDER — IOHEXOL 350 MG/ML SOLN
INTRAVENOUS | Status: DC | PRN
  Administered 2018-11-12: 215 mL via INTRACARDIAC

## 2018-11-12 MED ORDER — FENTANYL CITRATE (PF) 100 MCG/2ML IJ SOLN
INTRAMUSCULAR | Status: AC
  Filled 2018-11-12: qty 2

## 2018-11-12 MED ORDER — SODIUM CHLORIDE 0.9% FLUSH: 3.0000 mL | INTRAVENOUS | Status: DC | PRN

## 2018-11-12 MED ORDER — SODIUM CHLORIDE 0.9 % IV SOLN: 250.0000 mL | INTRAVENOUS | Status: DC | PRN

## 2018-11-12 MED ORDER — ADENOSINE (DIAGNOSTIC) FOR INTRACORONARY USE
INTRAVENOUS | Status: DC | PRN
  Administered 2018-11-12 (×2): 120 ug via INTRACORONARY

## 2018-11-12 MED ORDER — HEPARIN (PORCINE) IN NACL 1000-0.9 UT/500ML-% IV SOLN
INTRAVENOUS | Status: AC
  Filled 2018-11-12: qty 500

## 2018-11-12 MED ORDER — SODIUM CHLORIDE 0.9% FLUSH: 3.0000 mL | Freq: Two times a day (BID) | INTRAVENOUS | Status: DC

## 2018-11-12 MED ORDER — SODIUM CHLORIDE 0.9 % WEIGHT BASED INFUSION
1.0000 mL/kg/h | INTRAVENOUS | Status: AC
  Administered 2018-11-12: 1 mL/kg/h via INTRAVENOUS

## 2018-11-12 MED ORDER — CLOPIDOGREL BISULFATE 300 MG PO TABS
ORAL_TABLET | ORAL | Status: AC
  Filled 2018-11-12: qty 1

## 2018-11-12 MED ORDER — ADENOSINE 6 MG/2ML IV SOLN
INTRAVENOUS | Status: AC
  Filled 2018-11-12: qty 2

## 2018-11-12 MED ORDER — VERAPAMIL HCL 2.5 MG/ML IV SOLN
INTRAVENOUS | Status: DC | PRN
  Administered 2018-11-12: 10 mL via INTRA_ARTERIAL

## 2018-11-12 MED ORDER — LIDOCAINE HCL (PF) 1 % IJ SOLN
INTRAMUSCULAR | Status: AC
  Filled 2018-11-12: qty 30

## 2018-11-12 MED ORDER — AMLODIPINE BESYLATE 10 MG PO TABS
10.0000 mg | ORAL_TABLET | Freq: Every day | ORAL | Status: DC
  Administered 2018-11-13: 10 mg via ORAL
  Filled 2018-11-12: qty 1

## 2018-11-12 MED ORDER — HEPARIN SODIUM (PORCINE) 1000 UNIT/ML IJ SOLN
INTRAMUSCULAR | Status: AC
  Filled 2018-11-12: qty 1

## 2018-11-12 MED ORDER — HYDRALAZINE HCL 25 MG PO TABS
25.0000 mg | ORAL_TABLET | Freq: Three times a day (TID) | ORAL | Status: DC
  Administered 2018-11-12 – 2018-11-13 (×3): 25 mg via ORAL
  Filled 2018-11-12 (×3): qty 1

## 2018-11-12 MED ORDER — HEPARIN (PORCINE) IN NACL 1000-0.9 UT/500ML-% IV SOLN
INTRAVENOUS | Status: DC | PRN
  Administered 2018-11-12 (×3): 500 mL

## 2018-11-12 MED ORDER — FENTANYL CITRATE (PF) 100 MCG/2ML IJ SOLN
INTRAMUSCULAR | Status: DC | PRN
  Administered 2018-11-12: 25 ug via INTRAVENOUS

## 2018-11-12 MED ORDER — LIDOCAINE HCL (PF) 1 % IJ SOLN
INTRAMUSCULAR | Status: DC | PRN
  Administered 2018-11-12: 2 mL

## 2018-11-12 MED ORDER — CLOPIDOGREL BISULFATE 300 MG PO TABS
ORAL_TABLET | ORAL | Status: DC | PRN
  Administered 2018-11-12: 300 mg via ORAL

## 2018-11-12 MED ORDER — HEPARIN SODIUM (PORCINE) 1000 UNIT/ML IJ SOLN
INTRAMUSCULAR | Status: DC | PRN
  Administered 2018-11-12 (×2): 5000 [IU] via INTRAVENOUS
  Administered 2018-11-12: 3000 [IU] via INTRAVENOUS

## 2018-11-12 SURGICAL SUPPLY — 27 items
BALLN SAPPHIRE 2.5X15 (BALLOONS) ×2
BALLN WOLVERINE 2.50X15 (BALLOONS) ×2
BALLN ~~LOC~~ EMERGE MR 3.25X20 (BALLOONS) ×2
BALLOON SAPPHIRE 2.5X15 (BALLOONS) ×1 IMPLANT
BALLOON WOLVERINE 2.50X15 (BALLOONS) ×1 IMPLANT
BALLOON ~~LOC~~ EMERGE MR 3.25X20 (BALLOONS) ×1 IMPLANT
CATH EXPO 5F MPA-1 (CATHETERS) ×2 IMPLANT
CATH INFINITI 5 FR IM (CATHETERS) ×2 IMPLANT
CATH INFINITI 5 FR JL3.5 (CATHETERS) ×2 IMPLANT
CATH INFINITI 5FR JL4 (CATHETERS) ×2 IMPLANT
CATH LAUNCHER 6FR AL1 (CATHETERS) ×1 IMPLANT
CATH OPTITORQUE TIG 4.0 5F (CATHETERS) ×2 IMPLANT
CATHETER LAUNCHER 6FR AL1 (CATHETERS) ×2
CATHETER LAUNCHER 6FR LCB (CATHETERS) ×2 IMPLANT
CATHETER LAUNCHER 6FR MP1 (CATHETERS) ×2 IMPLANT
DEVICE RAD COMP TR BAND LRG (VASCULAR PRODUCTS) ×2 IMPLANT
GUIDEWIRE INQWIRE 1.5J.035X260 (WIRE) ×1 IMPLANT
INQWIRE 1.5J .035X260CM (WIRE) ×2
KIT ENCORE 26 ADVANTAGE (KITS) ×2 IMPLANT
KIT HEART LEFT (KITS) ×2 IMPLANT
PACK CARDIAC CATHETERIZATION (CUSTOM PROCEDURE TRAY) ×2 IMPLANT
SHEATH RAIN RADIAL 21G 6FR (SHEATH) ×2 IMPLANT
STENT RESOLUTE ONYX 3.0X30 (Permanent Stent) ×2 IMPLANT
STENT RESOLUTE ONYX3.0X38 (Permanent Stent) ×2 IMPLANT
TRANSDUCER W/STOPCOCK (MISCELLANEOUS) ×2 IMPLANT
TUBING CIL FLEX 10 FLL-RA (TUBING) ×2 IMPLANT
WIRE RUNTHROUGH .014X180CM (WIRE) ×2 IMPLANT

## 2018-11-12 NOTE — Progress Notes (Addendum)
Progress Note  Patient Name: Robert Burns Date of Encounter: 11/12/2018  Primary Cardiologist: Quay Burow, MD   Subjective   Chest discomfort overnight however pain-free currently.  Using home CPAP.  Inpatient Medications    Scheduled Meds: . amLODipine  10 mg Oral Daily  . aspirin EC  81 mg Oral Daily  . atorvastatin  80 mg Oral q1800  . clopidogrel  75 mg Oral Q breakfast  . enoxaparin (LOVENOX) injection  40 mg Subcutaneous Q24H  . hydrALAZINE  25 mg Oral TID  . insulin aspart  0-5 Units Subcutaneous QHS  . insulin aspart  0-9 Units Subcutaneous TID WC  . isosorbide mononitrate  45 mg Oral Daily  . metoprolol tartrate  100 mg Oral BID  . rOPINIRole  1 mg Oral QHS  . sodium chloride flush  3 mL Intravenous Q12H  . tamsulosin  0.4 mg Oral QPM   Continuous Infusions: . sodium chloride    . sodium chloride 1 mL/kg/hr (11/12/18 2952)   PRN Meds: sodium chloride, acetaminophen, nitroGLYCERIN, ondansetron (ZOFRAN) IV, sodium chloride flush   Vital Signs    Vitals:   11/11/18 2011 11/12/18 0025 11/12/18 0439 11/12/18 0441  BP: (!) 160/71 (!) 127/44  (!) 152/74  Pulse: 62 61  78  Resp: 16 17  18   Temp: 97.6 F (36.4 C)   97.7 F (36.5 C)  TempSrc: Oral   Oral  SpO2: 97% 98%  97%  Weight:   77.5 kg   Height:        Intake/Output Summary (Last 24 hours) at 11/12/2018 0743 Last data filed at 11/12/2018 8413 Gross per 24 hour  Intake 523.79 ml  Output -  Net 523.79 ml   Last 3 Weights 11/12/2018 11/11/2018 08/22/2018  Weight (lbs) 170 lb 12.8 oz 169 lb 166 lb  Weight (kg) 77.474 kg 76.658 kg 75.297 kg      Telemetry    Atrial paced rhythm- Personally Reviewed  ECG    Sinus rhythm at rate of 62 bpm, PAC with aberrancy, right bundle branch block- Personally Reviewed  Physical Exam   GEN: Elderly gentleman laying on bed on CPAP without acute distress.   Neck: No JVD Cardiac: RRR, no murmurs, rubs, or gallops.  Respiratory: Clear to auscultation  bilaterally. GI: Soft, nontender, non-distended  MS: No edema; No deformity. Neuro:  Nonfocal  Psych: Normal affect   Labs    High Sensitivity Troponin:   Recent Labs  Lab 11/11/18 1301 11/11/18 1520  TROPONINIHS 6 8      Chemistry Recent Labs  Lab 11/11/18 1213 11/12/18 0347  NA 140 140  K 3.8 4.2  CL 107 103  CO2 23 28  GLUCOSE 155* 197*  BUN 21 20  CREATININE 0.86 0.98  CALCIUM 9.3 9.5  PROT 6.6  --   ALBUMIN 3.8  --   AST 17  --   ALT 20  --   ALKPHOS 61  --   BILITOT 0.9  --   GFRNONAA >60 >60  GFRAA >60 >60  ANIONGAP 10 9     Hematology Recent Labs  Lab 11/11/18 1213 11/11/18 1911  WBC 11.2* 12.1*  RBC 4.55 4.83  HGB 12.6* 13.5  HCT 39.2 41.6  MCV 86.2 86.1  MCH 27.7 28.0  MCHC 32.1 32.5  RDW 14.5 14.5  PLT 151 166    Radiology    Dg Chest 2 View  Result Date: 11/11/2018 CLINICAL DATA:  Chest pain worsening for several weeks,  diabetes mellitus, hypertension, coronary artery disease post stenting and CABG, former smoker EXAM: CHEST - 2 VIEW COMPARISON:  03/29/2017 FINDINGS: RIGHT subclavian pacemaker with leads projecting at RIGHT atrium and RIGHT ventricle. Normal heart size post CABG. Mediastinal contours and pulmonary vascularity normal. Atherosclerotic calcification aorta. Lungs clear. No acute infiltrate, pleural effusion or pneumothorax. Calcified granuloma LEFT mid lung. Bones demineralized. IMPRESSION: Post CABG. No acute abnormalities. Electronically Signed   By: Lavonia Dana M.D.   On: 11/11/2018 13:18    Cardiac Studies   Pending cardiac catheterization  Patient Profile     Robert Burns is a 78 y.o. 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  Similar to prior CABG.  Assessment & Plan    1. Chest pain. -Mixed symptoms.   Similar in character to his pain before CABG.  This could be anginal equivalent.  Mild chest discomfort overnight but currently chest pain-free.  For cardiac catheterization later today.  Continue aspirin, Plavix, beta-blocker, Imdur and statin.  2.  Sleep apnea -On CPAP  3.  Hypertension -Blood pressure minimally elevated.  Did not got his amlodipine yesterday. -Give his medication early this morning  4.  Hyperlipidemia -LDL 33 January 2020 -Continue statin  5. DM - On SSI  For questions or updates, please contact [place] Please consult [email or web address] for contact info under        Signed, Leanor Kail, PA  11/12/2018, 7:43 AM    I have examined the patient and reviewed assessment and plan and discussed with patient.  Agree with above as stated.  I personally reviewed the cath films with Dr. Fletcher Anon.  Severe ISR of the area of cutting balloon PTCA from 11/19.  Plan for restenting.  Progression of SVG to RCA disease.  THere is also severe ISR of distal LAD.  Medical management for this planned.   May be able to consider discharge later today since cath was done early.   Larae Grooms

## 2018-11-12 NOTE — Interval H&P Note (Signed)
Cath Lab Visit (complete for each Cath Lab visit)  Clinical Evaluation Leading to the Procedure:   ACS: No.  Non-ACS:    Anginal Classification: CCS IV  Anti-ischemic medical therapy: Maximal Therapy (2 or more classes of medications)  Non-Invasive Test Results: No non-invasive testing performed  Prior CABG: Previous CABG      History and Physical Interval Note:  11/12/2018 9:10 AM  Robert Burns  has presented today for surgery, with the diagnosis of chest pain.  The various methods of treatment have been discussed with the patient and family. After consideration of risks, benefits and other options for treatment, the patient has consented to  Procedure(s): LEFT HEART CATH AND CORS/GRAFTS ANGIOGRAPHY (N/A) as a surgical intervention.  The patient's history has been reviewed, patient examined, no change in status, stable for surgery.  I have reviewed the patient's chart and labs.  Questions were answered to the patient's satisfaction.     Kathlyn [place]

## 2018-11-12 NOTE — Progress Notes (Signed)
Patient is new admit and has medications to catch up on including imdur and norvasc that is usually taken in the AM.  Patient also had hydralazine and metoprolol scheduled at 2200. This RN paged cardiology to clarify which medications to give.  Cardiology stated to hold the norvasc and imdur and give metoprolol and hydralazine.

## 2018-11-12 NOTE — Plan of Care (Signed)
  Problem: Education: Goal: Understanding of CV disease, CV risk reduction, and recovery process will improve Outcome: Progressing Goal: Individualized Educational Video(s) Outcome: Progressing   Problem: Activity: Goal: Ability to return to baseline activity level will improve Outcome: Progressing   Problem: Cardiovascular: Goal: Ability to achieve and maintain adequate cardiovascular perfusion will improve Outcome: Progressing Goal: Vascular access site(s) Level 0-1 will be maintained Outcome: Progressing   Problem: Health Behavior/Discharge Planning: Goal: Ability to safely manage health-related needs after discharge will improve Outcome: Progressing   Problem: Education: Goal: Ability to demonstrate management of disease process will improve Outcome: Progressing Goal: Ability to verbalize understanding of medication therapies will improve Outcome: Progressing Goal: Individualized Educational Video(s) Outcome: Progressing   Problem: Activity: Goal: Capacity to carry out activities will improve Outcome: Progressing   Problem: Cardiac: Goal: Ability to achieve and maintain adequate cardiopulmonary perfusion will improve Outcome: Progressing   Problem: Education: Goal: Knowledge of General Education information will improve Description: Including pain rating scale, medication(s)/side effects and non-pharmacologic comfort measures Outcome: Progressing   Problem: Health Behavior/Discharge Planning: Goal: Ability to manage health-related needs will improve Outcome: Progressing   Problem: Clinical Measurements: Goal: Ability to maintain clinical measurements within normal limits will improve Outcome: Progressing Goal: Will remain free from infection Outcome: Progressing Goal: Diagnostic test results will improve Outcome: Progressing Goal: Respiratory complications will improve Outcome: Progressing Goal: Cardiovascular complication will be avoided Outcome:  Progressing   Problem: Activity: Goal: Risk for activity intolerance will decrease Outcome: Progressing   Problem: Nutrition: Goal: Adequate nutrition will be maintained Outcome: Progressing   Problem: Coping: Goal: Level of anxiety will decrease Outcome: Progressing   Problem: Elimination: Goal: Will not experience complications related to bowel motility Outcome: Progressing Goal: Will not experience complications related to urinary retention Outcome: Progressing   Problem: Pain Managment: Goal: General experience of comfort will improve Outcome: Progressing   Problem: Safety: Goal: Ability to remain free from injury will improve Outcome: Progressing   Problem: Skin Integrity: Goal: Risk for impaired skin integrity will decrease Outcome: Progressing

## 2018-11-13 ENCOUNTER — Telehealth: Payer: Self-pay | Admitting: Cardiovascular Disease

## 2018-11-13 DIAGNOSIS — E118 Type 2 diabetes mellitus with unspecified complications: Secondary | ICD-10-CM

## 2018-11-13 DIAGNOSIS — E7800 Pure hypercholesterolemia, unspecified: Secondary | ICD-10-CM

## 2018-11-13 DIAGNOSIS — I25700 Atherosclerosis of coronary artery bypass graft(s), unspecified, with unstable angina pectoris: Secondary | ICD-10-CM

## 2018-11-13 LAB — BASIC METABOLIC PANEL
Anion gap: 8 (ref 5–15)
BUN: 17 mg/dL (ref 8–23)
CO2: 23 mmol/L (ref 22–32)
Calcium: 8.8 mg/dL — ABNORMAL LOW (ref 8.9–10.3)
Chloride: 107 mmol/L (ref 98–111)
Creatinine, Ser: 0.85 mg/dL (ref 0.61–1.24)
GFR calc Af Amer: >60 mL/min (ref >60)
GFR calc non Af Amer: >60 mL/min (ref >60)
Glucose, Bld: 177 mg/dL — ABNORMAL HIGH (ref 70–99)
Potassium: 3.9 mmol/L (ref 3.5–5.1)
Sodium: 138 mmol/L (ref 135–145)

## 2018-11-13 LAB — CBC
HCT: 38.7 % — ABNORMAL LOW (ref 39.0–52.0)
Hemoglobin: 12.5 g/dL — ABNORMAL LOW (ref 13.0–17.0)
MCH: 27.8 pg (ref 26.0–34.0)
MCHC: 32.3 g/dL (ref 30.0–36.0)
MCV: 86.2 fL (ref 80.0–100.0)
Platelets: 171 10*3/uL (ref 150–400)
RBC: 4.49 MIL/uL (ref 4.22–5.81)
RDW: 14.4 % (ref 11.5–15.5)
WBC: 12.9 10*3/uL — ABNORMAL HIGH (ref 4.0–10.5)
nRBC: 0 % (ref 0.0–0.2)

## 2018-11-13 LAB — GLUCOSE, CAPILLARY: Glucose-Capillary: 202 mg/dL — ABNORMAL HIGH (ref 70–99)

## 2018-11-13 NOTE — Progress Notes (Signed)
Pt will self administer home CPAP

## 2018-11-13 NOTE — TOC Transition Note (Signed)
Transition of Care [facility]) - CM/SW Discharge Note   Patient Details  Name: Robert Burns MRN: [id number] Date of Birth: 29-Jun-1940  [facility] CM/SW Contact:  Pollie Friar, RN Phone Number: 11/13/2018, 12:01 PM   Clinical Narrative:    Pt discharging home with self care. Pt has hospital f/u, insurance and transportation home.   Final next level of care: Home/Self Care Barriers to Discharge: No Barriers Identified   Patient Goals and CMS Choice        Discharge Placement                       Discharge Plan and Services                                     Social Determinants of Health (SDOH) Interventions     Readmission Risk Interventions No flowsheet data found.

## 2018-11-13 NOTE — Telephone Encounter (Signed)
TOC-currently admitted. 

## 2018-11-13 NOTE — Telephone Encounter (Signed)
11/11/2018 - present (2 days) [place]

## 2018-11-13 NOTE — Progress Notes (Signed)
CARDIAC REHAB PHASE I   PRE:  Rate/Rhythm: 35 SR with PVCs    BP: sitting 18/2/70    SaO2:   MODE:  Ambulation: 620 ft   POST:  Rate/Rhythm: 115 ST    BP: sitting 199/97, retake 7 min later 168/74     SaO2:   Ambulated decent pace. HR increased with distance. C/o 2/10 chest tightness after walk. Resolved after 10 min of rest. BP quite elevated as well. Looking back, this happens with him when we walk with him after every PCI. Discussed stents (no stent card found), Plavix, restrictions, diet, exercise, NTG, and CRPII. Voiced understanding but admits to not following diet well. He also struggles to walk/exercise. Discussed making these priorities. He is willing to try CRPII again, he might do virtual. Will refer to [facility].  Pt is interested in participating in Virtual Cardiac Rehab. Pt advised that Virtual Cardiac Rehab is provided at no cost to the patient. Checklist:  1. Pt has smart device  ie smartphone and/or ipad for downloading an app  Yes 2. Reliable internet/wifi service    Yes 3. Understands how to use their smartphone and navigate within an app.  Yes  Reviewed with pt the scheduling process for virtual cardiac rehab.  Pt verbalized understanding. [place], ACSM 11/13/2018 9:20 AM

## 2018-11-13 NOTE — Discharge Summary (Addendum)
Discharge Summary    Patient ID: Robert Burns,  MRN: [id number], DOB/AGE: Aug 31, 1940 78 y.o.  Admit date: 11/11/2018 Discharge date: 11/13/2018  Primary Care Provider: [name] Primary Cardiologist: Quay Burow, MD  Discharge Diagnoses    Principal Problem:   Unstable angina [facility]) Active Problems:   Type 2 diabetes mellitus with complication, without long-term current use of insulin ([place])   Essential hypertension   Pacemaker   CAD (coronary artery disease) of bypass graft   Hypercholesterolemia   Allergies Allergies  Allergen Reactions   Carvedilol Itching   Peanut-Containing Drug Products Anaphylaxis and Other (See Comments)    Tongue swelling is severe Other reaction(s): Unknown   Ace Inhibitors Other (See Comments), Rash and Hives    Cough Other reaction(s): Unknown Cough    Diltiazem Hcl Other (See Comments)    Unknown Other reaction(s): Other (See Comments), Unknown Unknown    Duloxetine Hcl     Other reaction(s): Other Urinary frequency   Meloxicam Other (See Comments)    GI upset Other reaction(s): Other (See Comments), Unknown GI upset GI upset GI BLEED    Clonidine Hcl Other (See Comments)    Patch only - skin irritation    Diagnostic Studies/Procedures    Cath: 11/12/18   Mid LM lesion is 50% stenosed.  Ost LAD to Prox LAD lesion is 100% stenosed.  Ost Cx lesion is 40% stenosed.  Prox Cx lesion is 30% stenosed.  Ost 3rd Mrg to 3rd Mrg lesion is 99% stenosed.  Ost RCA to Prox RCA lesion is 100% stenosed.  Ost Ramus lesion is 95% stenosed.  Dist Graft lesion is 50% stenosed.  Prox Graft lesion is 95% stenosed.  Origin to Mid Graft lesion before Prox LAD is 100% stenosed.  Prox Graft to Dist Graft lesion between Ramus and 2nd Mrg is 100% stenosed.  Mid Graft to Dist Graft lesion is 90% stenosed.  Post intervention, there is a 0% residual stenosis.  A drug-eluting stent was successfully placed using a STENT  RESOLUTE ONYX 3.0X30.  The left ventricular systolic function is normal.  LV end diastolic pressure is normal.  The left ventricular ejection fraction is 55-65% by visual estimate.  Mid Cx lesion is 90% stenosed.  Origin to Prox Graft lesion is 60% stenosed.  Post intervention, there is a 0% residual stenosis.  A drug-eluting stent was successfully placed using a STENT RESOLUTE [id number].  Mid LAD-2 lesion is 20% stenosed.  Non-stenotic Mid LAD-1 lesion.  Dist LAD lesion is 70% stenosed.   1.  Severe underlying three-vessel coronary artery disease with patent LIMA to LAD, patent SVG to diagonal with severe proximal in-stent restenosis, patent SVG to RCA with moderate in-stent restenosis proximally and severe new stenosis in the mid to distal segment, and occluded SVG to ramus.  Significant in-stent restenosis in the distal LAD stent which was placed last year.  2.  Normal LV systolic function and left ventricular end-diastolic pressure. 3.  Successful PCI and drug-eluting stent placement to SVG to diagonal and SVG to RCA.  Recommendations: Recommend medical therapy for the rest of his CAD including the distal LAD stent. See recommendations below.  Diagnostic Dominance: Right  Intervention     _____________   History of Present Illness    78 y.o. male with past medical history significant for CAD s/p CABG 06/2002, PAD s/p left S of a PTA and stenting 2004, sick sinus syndrome status post PPM 2008, OSA on CPAP who presented with chest  pain becoming more frequent over the last 3 weeks.   [name] has past medical history as above. He has a history of CAD status post coronary artery bypass grafting March 2004 with a LIMA to his LAD, a vein to a diagonal branch, a sequential vein to a ramus and OM branch, as well as a vein to the PDA. Last functional study performed July 28, 2011, was entirely normal.  He did have cardiac catheterization in 01/2015 revealing high-grade  segmental proximal right SVG disease and subtotally occluded diagonal branch SVG.  He underwent stenting of the RCA SVG successfully.  He had staged diagonal branch SVG intervention in 02/2015.   The patient was admitted to the hospital with unstable angina in 01/2018 and underwent cardiac catheterization that revealed disease in all 3 vein grafts as well as in the LAD beyond LIMA insertion.  2 days later he underwent complex intervention on his diagonal and ramus branch vein grafts with restenting as well as intervention on his LAD via LIMA insertion.  His RCA vein graft was not intervened on.  He was last seen via telemedicine visit by Dr. Gwenlyn Found on 08/22/2018 at which time he was not having any anginal symptoms.  He had some nonspecific symptoms of lethargy and fatigue in the morning.  [name] stated that he began having increased frequency of chest discomfort about 3 weeks ago.  It had become more frequent up to 5-6 times per day, not more intense.  He described it as feeling like severe heartburn in the central chest, radiating to bilateral neck and occasional to bilateral shoulders.  This lasted for a few minutes.  He had not used nitroglycerin.  He said that he had not been very active over the last 3 weeks and his symptoms did not seem to be related to activity, but he wasn't sure. He had no associated shortness of breath, lightheadedness, diaphoresis, palpitations or nausea.  He had no DOE, orthopnea, PND or edema.  He thought that this discomfort was reminiscent of the discomfort prior to his CABG surgery.  He reported compliance with all of his cardiac medications.  The patient had not had issues with fatigue and lack of energy.  He reported that he sleeps 10+ hours per night and felt hung over upon awakening.  He said that after his intervention in November he did not have significant improvement in how he felt.  His PCP had made some medication adjustments and he had adjustment to his CPAP  settings, all without significant improvement in his symptoms.  He did report that he was compliant with use of CPAP every night. Given his ongoing symptoms he was admitted from the ED for further work up with plans for cardiac cath.   Hospital Course     HsT remained negative. Taken for cardiac cath on 11/12/18 with severe underlying three-vessel coronary artery disease with patent LIMA to LAD, patent SVG to diagonal with severe proximal in-stent restenosis, patent SVG to RCA with moderate in-stent restenosis proximally and severe new stenosis in the mid to distal segment, and occluded SVG to ramus. Significant in-stent restenosis in the distal LAD stent which was placed last year. Underwent successful PCI/DES to the SVG-Diag and dSVG-RCA. Plan for DAPT with ASA/plavix for at least one year, and likely beyond given multiple stents within vein grafts. Reported his metoprolol was reduced back to 50mg  BID before admission as it was felt this was contributing to his fatigue. Was ordered as 100mg  BID on admission  and blood pressures tolerated. Will continue the same at discharge. Worked well with cardiac rehab.    General: Well developed, well nourished, male appearing in no acute distress. Head: Normocephalic, atraumatic.  Neck: Supple without bruits, JVD. Lungs:  Resp regular and unlabored, CTA. Heart: RRR, S1, S2, no S3, S4, or murmur; no rub. Abdomen: Soft, non-tender, non-distended with normoactive bowel sounds. No hepatomegaly. No rebound/guarding. No obvious abdominal masses. Extremities: No clubbing, cyanosis, edema. Distal pedal pulses are 2+ bilaterally. Left radial cath site stable without bruising or hematoma Neuro: Alert and oriented X 3. Moves all extremities spontaneously. Psych: Normal affect.  Wendell Nicoson Chlebowski was seen by Dr. Christ Kick and determined stable for discharge home. Follow up in the office has been arranged. Medications are listed below.   _____________  Discharge Vitals Blood  pressure (!) 156/65, pulse 65, temperature 97.6 F (36.4 C), temperature source Axillary, resp. rate (!) 21, height 5\' 7"  (1.702 m), weight 78 kg, SpO2 96 %.  Filed Weights   11/11/18 1210 11/12/18 0439 11/13/18 0740  Weight: 76.7 kg 77.5 kg 78 kg    Labs & Radiologic Studies    CBC Recent Labs    11/11/18 1213 11/11/18 1911 11/13/18 0629  WBC 11.2* 12.1* 12.9*  NEUTROABS 4.7  --   --   HGB 12.6* 13.5 12.5*  HCT 39.2 41.6 38.7*  MCV 86.2 86.1 86.2  PLT 151 166 086   Basic Metabolic Panel Recent Labs    11/12/18 0347 11/13/18 0629  NA 140 138  K 4.2 3.9  CL 103 107  CO2 28 23  GLUCOSE 197* 177*  BUN 20 17  CREATININE 0.98 0.85  CALCIUM 9.5 8.8*   Liver Function Tests Recent Labs    11/11/18 1213  AST 17  ALT 20  ALKPHOS 61  BILITOT 0.9  PROT 6.6  ALBUMIN 3.8   No results for input(s): LIPASE, AMYLASE in the last 72 hours. Cardiac Enzymes No results for input(s): CKTOTAL, CKMB, CKMBINDEX, TROPONINI in the last 72 hours. BNP Invalid input(s): POCBNP D-Dimer No results for input(s): DDIMER in the last 72 hours. Hemoglobin A1C Recent Labs    11/11/18 1213  HGBA1C 8.2*   Fasting Lipid Panel No results for input(s): CHOL, HDL, LDLCALC, TRIG, CHOLHDL, LDLDIRECT in the last 72 hours. Thyroid Function Tests No results for input(s): TSH, T4TOTAL, T3FREE, THYROIDAB in the last 72 hours.  Invalid input(s): FREET3 _____________  Dg Chest 2 View  Result Date: 11/11/2018 CLINICAL DATA:  Chest pain worsening for several weeks, diabetes mellitus, hypertension, coronary artery disease post stenting and CABG, former smoker EXAM: CHEST - 2 VIEW COMPARISON:  03/29/2017 FINDINGS: RIGHT subclavian pacemaker with leads projecting at RIGHT atrium and RIGHT ventricle. Normal heart size post CABG. Mediastinal contours and pulmonary vascularity normal. Atherosclerotic calcification aorta. Lungs clear. No acute infiltrate, pleural effusion or pneumothorax. Calcified granuloma  LEFT mid lung. Bones demineralized. IMPRESSION: Post CABG. No acute abnormalities. Electronically Signed   By: Lavonia Dana M.D.   On: 11/11/2018 13:18   Disposition   Pt is being discharged home today in good condition.  Follow-up Plans & Appointments    Follow-up Information    Almyra Deforest, [place] Follow up on 11/22/2018.   Specialties: Cardiology, Radiology Why: at 1:45pm for your follow up appt.  Contact information: [address] [place] [address] [phone]          Discharge Instructions    AMB Referral to Cardiac Rehabilitation - Phase II  Complete by: As directed    Diagnosis: Coronary Stents   After initial evaluation and assessments completed: Virtual Based Care may be provided alone or in conjunction with Phase 2 Cardiac Rehab based on patient barriers.: Yes   Call MD for:  redness, tenderness, or signs of infection (pain, swelling, redness, odor or green/yellow discharge around incision site)   Complete by: As directed    Diet - low sodium heart healthy   Complete by: As directed    Discharge instructions   Complete by: As directed    Radial Site Care Refer to this sheet in the next few weeks. These instructions provide you with information on caring for yourself after your procedure. Your caregiver may also give you more specific instructions. Your treatment has been planned according to current medical practices, but problems sometimes occur. Call your caregiver if you have any problems or questions after your procedure. HOME CARE INSTRUCTIONS You may shower the day after the procedure.Remove the bandage (dressing) and gently wash the site with plain soap and water.Gently pat the site dry.  Do not apply powder or lotion to the site.  Do not submerge the affected site in water for 3 to 5 days.  Inspect the site at least twice daily.  Do not flex or bend the affected arm for 24 hours.  No lifting over 5 pounds (2.3 kg) for 5 days after your  procedure.  Do not drive home if you are discharged the same day of the procedure. Have someone else drive you.  You may drive 24 hours after the procedure unless otherwise instructed by your caregiver.  What to expect: Any bruising will usually fade within 1 to 2 weeks.  Blood that collects in the tissue (hematoma) may be painful to the touch. It should usually decrease in size and tenderness within 1 to 2 weeks.  SEEK IMMEDIATE MEDICAL CARE IF: You have unusual pain at the radial site.  You have redness, warmth, swelling, or pain at the radial site.  You have drainage (other than a small amount of blood on the dressing).  You have chills.  You have a fever or persistent symptoms for more than 72 hours.  You have a fever and your symptoms suddenly get worse.  Your arm becomes pale, cool, tingly, or numb.  You have heavy bleeding from the site. Hold pressure on the site.   Increase activity slowly   Complete by: As directed      Discharge Medications     Medication List    TAKE these medications   amLODipine 10 MG tablet Commonly known as: NORVASC Take 10 mg by mouth daily.   aspirin 81 MG chewable tablet Chew 1 tablet (81 mg total) by mouth daily.   atorvastatin 80 MG tablet Commonly known as: LIPITOR Take 1 tablet (80 mg total) by mouth daily at 6 PM.   clopidogrel 75 MG tablet Commonly known as: PLAVIX Take 1 tablet (75 mg total) by mouth daily with breakfast. What changed: when to take this   glipiZIDE 5 MG tablet Commonly known as: GLUCOTROL Take 5 mg by mouth daily with breakfast.   hydrALAZINE 25 MG tablet Commonly known as: APRESOLINE Take 25 mg by mouth 2 (two) times daily.   hydrochlorothiazide 25 MG tablet Commonly known as: HYDRODIURIL Take 25 mg by mouth every morning.   isosorbide mononitrate 30 MG 24 hr tablet Commonly known as: IMDUR Take 1.5 tablets (45 mg total) by mouth daily.   metFORMIN 1000  MG tablet Commonly known as: GLUCOPHAGE Take 1  tablet (1,000 mg total) by mouth 2 (two) times daily with a meal. Notes to patient: Do not restarted until 11/14/18!!!   metoprolol tartrate 100 MG tablet Commonly known as: LOPRESSOR Take 1 tablet (100 mg total) by mouth 2 (two) times daily. What changed: how much to take   nitroGLYCERIN 0.4 MG SL tablet Commonly known as: Nitrostat Place 1 tablet (0.4 mg total) under the tongue every 5 (five) minutes as needed for chest pain.   potassium chloride SA 20 MEQ tablet Commonly known as: K-DUR Take 1 tablet (20 mEq total) by mouth daily.   tamsulosin 0.4 MG Caps capsule Commonly known as: FLOMAX Take 0.4 mg by mouth 2 (two) times daily.   Vitamin D3 50 MCG (2000 UT) capsule Take 2,000 Units by mouth 2 (two) times daily.        Acute coronary syndrome (MI, NSTEMI, STEMI, etc) this admission?: No.     Outstanding Labs/Studies   N/A  Duration of Discharge Encounter   Greater than 30 minutes including physician time.  Signed, Reino Bellis NP-C 11/13/2018, 9:13 AM   I have examined the patient and reviewed assessment and plan and discussed with patient.  Agree with above as stated.    GEN: Well nourished, well developed, in no acute distress  HEENT: normal  Neck: no JVD, carotid bruits, or masses Cardiac: RRR; no murmurs, rubs, or gallops,no edema  Respiratory:  clear to auscultation bilaterally, normal work of breathing GI: soft, nontender, nondistended,  MS: no deformity or atrophy ; left wrist hematoma Skin: warm and dry, no rash Neuro:  Strength and sensation are intact Psych: euthymic mood, full affect  S/p PCI yesterday. Continue DAPT for 12 months.  Left wrist with small hematoma.  2+ right radial pulse.  Avoid any bending or strenuous usage of the wrist.  OK to apply warm compress to wrist.  F/u with Dr. Gwenlyn Found.  Larae Grooms

## 2018-11-13 NOTE — Telephone Encounter (Signed)
New message   Per Lyndsey set up [facility] appointment with Almyra Deforest on 11/22/2018 at 1:45 pm.

## 2018-11-14 NOTE — Telephone Encounter (Signed)
Called patient regarding TOC appointment.  LVM advising to call back to discuss questions.  Left call back number.

## 2018-11-15 ENCOUNTER — Telehealth: Payer: Self-pay | Admitting: Cardiovascular Disease

## 2018-11-15 NOTE — Telephone Encounter (Signed)
Called patient, he states that he had a cath on 08/04- on his left arm, he has noticed it since yesterday once he took the gauze off, he has a yellow looking bump on top of where they done the cath. Patient states it has redness around it, and pressure pain feeling. He feels concerned as he has had cath before and never had this issue, patient has not done compresses of any kind, and has not tried anything since noticing it. He contacted PCP, and they suggested to call us.  Advised I would route to MD for suggestions.

## 2018-11-15 NOTE — Telephone Encounter (Signed)
Patient contacted regarding discharge from [facility] on 11/13/2018.  Patient understands to follow up with provider Isaac Laud PA on 11/22/18 at 1:45pm at [facility]. Patient understands discharge instructions? YES Patient understands medications and regiment? YES Patient understands to bring all medications to this visit? YES  Called in with concern - in a new phone note - has been sent to MD. Secure chat message with MD/RN to review

## 2018-11-15 NOTE — Telephone Encounter (Signed)
Per secure chat with MD, no changed recommended, OK to add on to MD scheduled 11/20/18.   This was communicated to patient who is concerned about getting an infection. He was advised to monitor site, symptoms and call back if things worsen.   Scheduled to see MD 11/20/18 @ 11am

## 2018-11-15 NOTE — Telephone Encounter (Signed)
  Patient had cardiac cath on 11/12/18 and he is calling because he has swelling that has made a knot has a pus pocket on top of it. He would like to know what he needs to do.

## 2018-11-20 ENCOUNTER — Ambulatory Visit (INDEPENDENT_AMBULATORY_CARE_PROVIDER_SITE_OTHER): Payer: Medicare HMO | Admitting: Cardiovascular Disease

## 2018-11-20 ENCOUNTER — Other Ambulatory Visit (HOSPITAL_COMMUNITY): Payer: Self-pay | Admitting: Cardiovascular Disease

## 2018-11-20 ENCOUNTER — Ambulatory Visit (HOSPITAL_COMMUNITY)
Admission: RE | Admit: 2018-11-20 | Discharge: 2018-11-20 | Disposition: A | Discharge status: Home / Self Care | Payer: Medicare HMO | Source: Ambulatory Visit | Attending: Cardiology | Admitting: Cardiology

## 2018-11-20 ENCOUNTER — Other Ambulatory Visit: Payer: Self-pay

## 2018-11-20 ENCOUNTER — Encounter: Payer: Self-pay | Admitting: Cardiovascular Disease

## 2018-11-20 VITALS — BP 148/76 | HR 94 | Temp 97.2°F | Ht 67.0 in | Wt 173.6 lb

## 2018-11-20 DIAGNOSIS — I739 Peripheral vascular disease, unspecified: Secondary | ICD-10-CM

## 2018-11-20 DIAGNOSIS — I25810 Atherosclerosis of coronary artery bypass graft(s) without angina pectoris: Secondary | ICD-10-CM | POA: Diagnosis not present

## 2018-11-20 DIAGNOSIS — G4733 Obstructive sleep apnea (adult) (pediatric): Secondary | ICD-10-CM

## 2018-11-20 DIAGNOSIS — R0989 Other specified symptoms and signs involving the circulatory and respiratory systems: Secondary | ICD-10-CM | POA: Diagnosis not present

## 2018-11-20 DIAGNOSIS — I721 Aneurysm of artery of upper extremity: Secondary | ICD-10-CM | POA: Diagnosis not present

## 2018-11-20 DIAGNOSIS — Z9989 Dependence on other enabling machines and devices: Secondary | ICD-10-CM | POA: Diagnosis not present

## 2018-11-20 DIAGNOSIS — I10 Essential (primary) hypertension: Secondary | ICD-10-CM | POA: Diagnosis not present

## 2018-11-20 DIAGNOSIS — E785 Hyperlipidemia, unspecified: Secondary | ICD-10-CM

## 2018-11-20 DIAGNOSIS — I495 Sick sinus syndrome: Secondary | ICD-10-CM | POA: Diagnosis not present

## 2018-11-20 DIAGNOSIS — E1169 Type 2 diabetes mellitus with other specified complication: Secondary | ICD-10-CM

## 2018-11-20 MED ORDER — ISOSORBIDE MONONITRATE ER 60 MG PO TB24: 60.0000 mg | ORAL_TABLET | Freq: Every day | ORAL | 3 refills | Status: DC

## 2018-11-20 NOTE — Assessment & Plan Note (Signed)
History of hyperlipidemia on high-dose statin therapy with lipid profile performed 04/16/2018 revealing a total cholesterol 78, LDL 33 and HDL 31

## 2018-11-20 NOTE — Assessment & Plan Note (Signed)
History of essential hypertension with blood pressure measured today at 148/76.  He is on amlodipine, hydralazine, hydrochlorothiazide, and metoprolol.

## 2018-11-20 NOTE — Progress Notes (Signed)
11/20/2018 Robert Burns   1940/05/11  [id number]  Primary Physician [name], MD Primary Cardiologist: Lorretta Harp MD Robert Burns, [name]  HPI:  Robert Burns is a 78 y.o.  married Caucasian male, father of 2, grandfather to 3 grandchildren whose wife Robert Burns who is also a patient of mine. I last saw him in theoffice  05/08/2018.Robert Burns He has a history of CAD status post coronary artery bypass grafting March 2004 with a LIMA to his LAD, a vein to a diagonal branch, a sequential vein to a ramus and OM branch, as well as a vein to the PDA. Last functional study performed July 28, 2011, was entirely normal. 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 He had a pacemaker placed for sick sinus syndrome November 2008 followed by Dr. Sallyanne Kuster. He has obstructive sleep apnea on CPAP. He complain of left thigh pain and I angiogram'd him revealing patent arteries though he did have a space-occupying lesion which was removed by Dr. Kellie Simmering with placement of an interposition 6-mm Gore-Tex graft. The pathology was uncertain. 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 Dr. Baxter Flattery follows his lipid profile.Since I saw him back 11/07/12 he has done well. Followup Dopplers performed in our office 09/27/12 revealed a high-grade lesion in the distal right SFA with an ABI of 0.3. His left ABI 1.1 without obstructive disease. His most recent lower extremity Doppler Dopplers performed 9/32/16 revealed a right ABI 0.82 And a left ABI of 0.89. Over the last 3 months he's noticed anginal chest pain occurring both at rest and with exertion with left upper extremity radiation. He also complains of left lower extremity discomfort. to moderate anterolateral ischemia. Because of ongoing chest pain and a Myoview that showed anterolateral ischemia and he underwent cardiac catheterization on 02/01/15 revealing high grade segmental proximal right SVG disease and subtotally occluded diagonal  branch SVG. He underwent stenting of his RCA SVG successfully. He does have continued chest pain although somewhat improved since his last procedure. I brought him back for staged diagonal branch SVG intervention on 03/01/15. This was successful and since that time he denies chest pain or shortness of breath. He underwent a generator change by Dr. Sallyanne Kuster in January for end-of-life of his Medtronic pacemaker.Since I saw him 6 months ago he developed new left calf claudication of the last 3 months. Lower extremity arterial Doppler studies performed today revealed a decline in his left ABI from 0.87 down to 0.59 with an occluded left SFA that is a new finding. 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 He had a short segment occlusion of the proximal left SFA with patent mid left SFA stent. He had 90% segmental calcified mid right SFA stenosis with three-vessel runoff bilaterally. I ended up stenting his left external iliac artery with a 9 mm x 40 mm long nitinol self-expanding stent which improved his symptoms somewhat although he continuedto have lifestyle limiting claudication. He underwent staged intervention of his left SFA on 01/03/16. I stented the entire quadrant totally occluded segment with a 6 mm x 250 mm long Viabahn Stent. His claudication on the left has resolved and his ABIs normalized. He now complains of right leg medication. I did demonstrate a 90% calcified segmental mid right SFA stenosis at the time of angiography 11/25/15. He underwent diamondback orbital rotation atherectomy/drug-eluting  angioplasty of his mid calcified right SFA by myself 02/14/16. This follow-up Dopplers performed 02/24/16 revealed normal ABIs bilaterally. His claudication has improved. When I saw him in the office possibly 3 weeks ago he was complaining of fairly new  onset substernal chest pain over the prior several months occurring several times a week. These were new since his RCA and diagonal branch vein graft interventions at the end of 2016. He is on good antianginal medications. A Myoview stress test was ordered that showed subtle inferolateral ischemia and echo showed normal LV function.Robert Burns  He was admitted to the hospital 02/06/2018 for 3 days with unstable angina. He underwent catheterization by Dr. Claiborne Billings on 02/06/2018 revealing disease in all 3 vein grafts as well as in the LAD beyond LIMA insertion, 2 days later he underwent complex intervention on his diagonal and ramus branch vein grafts with restenting as well as intervention on his LAD via LIMA insertion. His RCA vein graft was not intervened on. He currently denies angina or claudication.  Since I saw him back in January of this year he was admitted to the hospital with unstable angina on 11/11/2018.  Enzymes were negative.  He underwent catheterization and intervention by Dr. Fletcher Anon 11/12/2018 via the left radial approach with stenting of his proximal diagonal branch vein graft in his distal PDA vein graft.  Since discharge, his anginal symptoms have significantly improved although he still has a "heaviness feeling" in his chest.  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    Current Meds  Medication Sig   amLODipine (NORVASC) 10 MG tablet Take 10 mg by mouth daily.    aspirin 81 MG chewable tablet Chew 1 tablet (81 mg total) by mouth daily.   atorvastatin (LIPITOR) 80 MG tablet Take 1 tablet (80 mg total) by mouth daily at 6 PM.   Cholecalciferol (VITAMIN D3) 2000 units capsule Take 2,000 Units by mouth 2 (two) times daily.   clopidogrel (PLAVIX) 75 MG tablet Take 1 tablet (75 mg total) by mouth daily with breakfast. (Patient taking differently: Take 75 mg by mouth every morning. )   glipiZIDE (GLUCOTROL) 5 MG tablet Take 5 mg by mouth daily with breakfast.     hydrALAZINE (APRESOLINE) 25 MG tablet Take 25 mg by mouth 2 (two) times daily.    hydrochlorothiazide (HYDRODIURIL) 25 MG tablet Take 25 mg by mouth every morning.   metFORMIN (GLUCOPHAGE) 1000 MG tablet Take 1 tablet (1,000 mg total) by mouth 2 (two) times daily with a meal.   metoprolol tartrate (LOPRESSOR) 100 MG tablet Take 1 tablet (100 mg total) by mouth 2 (two) times daily. (Patient taking differently: Take 50 mg by mouth 2 (two) times daily. )   nitroGLYCERIN (NITROSTAT) 0.4 MG SL tablet Place 1 tablet (0.4 mg total) under the tongue every 5 (five) minutes as needed for chest pain.   potassium chloride SA (K-DUR,KLOR-CON) 20 MEQ tablet Take 1 tablet (20 mEq total) by mouth daily.   tamsulosin (FLOMAX) 0.4 MG CAPS capsule Take 0.4 mg by mouth 2 (two) times daily.      Allergies  Allergen Reactions   Carvedilol Itching   Peanut-Containing Drug Products Anaphylaxis and Other (See Comments)    Tongue swelling is severe Other reaction(s): Unknown   Ace Inhibitors Other (See Comments), Rash and Hives    Cough Other reaction(s): Unknown Cough    Diltiazem Hcl Other (See Comments)    Unknown Other reaction(s): Other (See Comments), Unknown  Unknown    Duloxetine Hcl     Other reaction(s): Other Urinary frequency   Meloxicam Other (See Comments)    GI upset Other reaction(s): Other (See Comments), Unknown GI upset GI upset GI BLEED    Clonidine Hcl Other (See Comments)    Patch only - skin irritation    Social History   Socioeconomic History   Marital status: Married    Spouse name: Not on file   Number of children: Not on file   Years of education: Not on file   Highest education level: Not on file  Occupational History   Not on file  Social Needs   Financial resource strain: Not on file   Food insecurity    Worry: Not on file    Inability: Not on file   Transportation needs    Medical: Not on file    Non-medical: Not on file  Tobacco Use     Smoking status: Former Smoker    Years: 10.00    Types: Pipe    Quit date: 78    Years since quitting: 47.6   Smokeless tobacco: Never Used   Tobacco comment: "quit smoking in 1973"  Substance and Sexual Activity   Alcohol use: No   Drug use: No   Sexual activity: Not Currently  Lifestyle   Physical activity    Days per week: Not on file    Minutes per session: Not on file   Stress: Not on file  Relationships   Social connections    Talks on phone: Not on file    Gets together: Not on file    Attends religious service: Not on file    Active member of club or organization: Not on file    Attends meetings of clubs or organizations: Not on file    Relationship status: Not on file   Intimate partner violence    Fear of current or ex partner: Not on file    Emotionally abused: Not on file    Physically abused: Not on file    Forced sexual activity: Not on file  Other Topics Concern   Not on file  Social History Narrative   Not on file     Review of Systems: General: negative for chills, fever, night sweats or weight changes.  Cardiovascular: negative for chest pain, dyspnea on exertion, edema, orthopnea, palpitations, paroxysmal nocturnal dyspnea or shortness of breath Dermatological: negative for rash Respiratory: negative for cough or wheezing Urologic: negative for hematuria Abdominal: negative for nausea, vomiting, diarrhea, bright red blood per rectum, melena, or hematemesis Neurologic: negative for visual changes, syncope, or dizziness All other systems reviewed and are otherwise negative except as noted above.    Blood pressure (!) 148/76, pulse 94, temperature (!) 97.2 F (36.2 C), temperature source Temporal, height 5\' 7"  (1.702 m), weight 173 lb 9.6 oz (78.7 kg).  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 Skin: Skin color, texture, turgor normal. No rashes or lesions Neurologic: Alert and oriented X 3, normal strength and tone. Normal symmetric reflexes. Normal coordination and gait  EKG not performed today  ASSESSMENT AND PLAN:   Essential hypertension History of essential  hypertension with blood pressure measured today at 148/76.  He is on amlodipine, hydralazine, hydrochlorothiazide, and metoprolol.  SSS (sick sinus syndrome), medtronic adapta History of sick sinus syndrome status post permanent transvenous pacemaker insertion followed by Dr. Sallyanne Kuster.  Hyperlipidemia due to type 2 diabetes mellitus ([place]) History of hyperlipidemia on high-dose statin therapy with lipid profile performed 04/16/2018 revealing a total cholesterol 78, LDL 33 and HDL 31  OSA on CPAP History of obstructive sleep apnea on CPAP  Peripheral arterial disease ([place]) History of peripheral arterial disease status post multiple interventions on his lower extremities performed by myself status post PTA and stenting 10/30/2002 of his left SFA.  He has had a jump graft performed Dr. Kellie Simmering with a 6 mm Gore-Tex graft in his left lower extremity.  Angiography performed 11/25/2015 demonstrated a high-grade lesion just prior to the interposition graft with short segment occlusion of the left SFA and 90% segmental calcified mid right SFA stenosis with three-vessel runoff.  Ended up stenting his external iliac with a 9 mm x 40 mm long nitinol self-expanding stent with staged left SFA intervention 01/03/2016.  He underwent diamondback atherectomy, drug-eluting angioplasty of a calcified mid right SFA 02/14/2016 with subsequent Dopplers showed marked improvement.   He does complain of some chronic tiredness in his legs but denies claudication has had in the past.  CAD (coronary artery disease) of bypass graft History of CAD status post bypass grafting March echo for.  He had intervention by Dr. Claiborne Billings 02/06/2018 of all 3 vein grafts and 2 days later underwent diagonal and ramus branch vein graft restenting as well as intervention of his LAD via LIMA insertion.  RCA vein graft was not intervened on at that time.  He was recently admitted with unstable angina 11/11/2018 discharged home 2 days later.  Enzymes were negative.  He underwent intervention by Dr. Fletcher Anon via the left radial approach with a stent in his proximal diagonal branch vein graft and a stent in his distal PDA vein graft.  His symptoms of chest pain have since resolved although he still complains of tightness in his chest.  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  I am going to increase his Imdur from 45 mg to 60 mg a day.      Lorretta Harp MD FACP,FACC,FAHA, [facility] 11/20/2018 11:33 AM

## 2018-11-20 NOTE — Assessment & Plan Note (Signed)
History of sick sinus syndrome status post permanent transvenous pacemaker insertion followed by Dr. Sallyanne Kuster.

## 2018-11-20 NOTE — Patient Instructions (Addendum)
Medication Instructions:  Your physician has recommended you make the following change in your medication:   INCREASE YOUR ISOSORBIDE MONONITRATE (IMDUR) TO 60 MG BY MOUTH DAILY  If you need a refill on your cardiac medications before your next appointment, please call your pharmacy.   Lab work: NONE If you have labs (blood work) drawn today and your tests are completely normal, you will receive your results only by: [name] MyChart Message (if you have MyChart) OR . A paper copy in the mail If you have any lab test that is abnormal or we need to change your treatment, we will call you to review the results.  Testing/Procedures: Your physician has requested that you have an upper extremity arterial duplex. This test is an ultrasound of the arteries in the  arms. It looks at arterial blood flow in the arms. Allow one hour for  Upper Arterial scans. There are no restrictions or special instructions   Follow-Up: At [facility], you and your health needs are our priority.  As part of our continuing mission to provide you with exceptional heart care, we have created designated Provider Care Teams.  These Care Teams include your primary Cardiologist (physician) and Advanced Practice Providers (APPs -  Physician Assistants and Nurse Practitioners) who all work together to provide you with the care you need, when you need it. You will need a follow up appointment in 4-6 weeks with Almyra Deforest, PA-C and in 3 months with Dr. Quay Burow.

## 2018-11-20 NOTE — Assessment & Plan Note (Signed)
History of obstructive sleep apnea on CPAP. 

## 2018-11-20 NOTE — Assessment & Plan Note (Signed)
History of peripheral arterial disease status post multiple interventions on his lower extremities performed by myself status post PTA and stenting 10/30/2002 of his left SFA.  He has had a jump graft performed Dr. Kellie Simmering with a 6 mm Gore-Tex graft in his left lower extremity.  Angiography performed 11/25/2015 demonstrated a high-grade lesion just prior to the interposition graft with short segment occlusion of the left SFA and 90% segmental calcified mid right SFA stenosis with three-vessel runoff.  Ended up stenting his external iliac with a 9 mm x 40 mm long nitinol self-expanding stent with staged left SFA intervention 01/03/2016.  He underwent diamondback atherectomy, drug-eluting angioplasty of a calcified mid right SFA 02/14/2016 with subsequent Dopplers showed marked improvement.  He does complain of some chronic tiredness in his legs but denies claudication has had in the past.

## 2018-11-20 NOTE — Assessment & Plan Note (Signed)
History of CAD status post bypass grafting March echo for.  He had intervention by Dr. Claiborne Billings 02/06/2018 of all 3 vein grafts and 2 days later underwent diagonal and ramus branch vein graft restenting as well as intervention of his LAD via LIMA insertion.  RCA vein graft was not intervened on at that time.  He was recently admitted with unstable angina 11/11/2018 discharged home 2 days later.  Enzymes were negative.  He underwent intervention by Dr. Fletcher Anon via the left radial approach with a stent in his proximal diagonal branch vein graft and a stent in his distal PDA vein graft.  His symptoms of chest pain have since resolved although he still complains of tightness in his chest.  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  I am going to increase his Imdur from 45 mg to 60 mg a day.

## 2018-11-22 ENCOUNTER — Ambulatory Visit: Payer: Medicare HMO | Admitting: Physician Assistant

## 2018-11-22 ENCOUNTER — Telehealth: Payer: Self-pay | Admitting: Cardiovascular Disease

## 2018-11-22 NOTE — Telephone Encounter (Signed)
New message   Patient states that it is dark around the place where he had a heart cath and it is getting larger. Patient's states that he numbness in arm and hand. Please call the patient .

## 2018-11-22 NOTE — Telephone Encounter (Signed)
Called and LVM advising of message to call back to discuss. Left call back number.

## 2018-11-25 ENCOUNTER — Other Ambulatory Visit: Payer: Self-pay

## 2018-11-25 DIAGNOSIS — I729 Aneurysm of unspecified site: Secondary | ICD-10-CM

## 2018-11-25 NOTE — Telephone Encounter (Signed)
Folow Up:   Returning Robert Burns's call from Friday.

## 2018-11-25 NOTE — Telephone Encounter (Signed)
Left message for pt to call.

## 2018-11-26 NOTE — Telephone Encounter (Signed)
S/w pt and daughter, dawn they state that left wrist has gotten bigger since being seen it is a golf ball size "bump" at his Cath procedure site she states that it is red and warm in the "middle" and bruised around the site. Pain level when touched/bumped Is 8-9/10 but when doing nothing it is just a 3/10. Will call [facility] and see if I can get appt moved up before Monday when it is currently scheduled.  Called [facility] and they moved it up until tomorrow @ 1145am d/t COVID no visitors and please wear a mask.  Tried to [facility] pt/daughter at pt's mobile number-this goes directly to VM and daughter cell [phone] goes directly to VM also. Will await CB

## 2018-11-26 NOTE — Telephone Encounter (Signed)
Tried to [facility] pt/daughter at 3 numbers goes directly to VM

## 2018-11-26 NOTE — Telephone Encounter (Signed)
Pt's daughter [name] called back notified of appt tomorrow at [facility]. She will wait in the car and have pt call on cell phone so she can participate in appt if needed

## 2018-11-26 NOTE — Telephone Encounter (Signed)
Follow Up:   Daughter said she received a message to call back and ask for triage.

## 2018-11-27 ENCOUNTER — Encounter: Payer: Self-pay | Admitting: *Deleted

## 2018-11-27 ENCOUNTER — Encounter: Payer: Self-pay | Admitting: Vascular Surgery

## 2018-11-27 ENCOUNTER — Other Ambulatory Visit: Payer: Self-pay

## 2018-11-27 ENCOUNTER — Ambulatory Visit (INDEPENDENT_AMBULATORY_CARE_PROVIDER_SITE_OTHER): Payer: Medicare HMO | Admitting: Vascular Surgery

## 2018-11-27 ENCOUNTER — Ambulatory Visit (HOSPITAL_COMMUNITY)
Admission: RE | Admit: 2018-11-27 | Discharge: 2018-11-27 | Disposition: A | Discharge status: Home / Self Care | Payer: Medicare HMO | Source: Ambulatory Visit | Attending: Surgery | Admitting: Surgery

## 2018-11-27 VITALS — BP 109/69 | HR 85 | Temp 97.9°F | Resp 12 | Ht 67.0 in | Wt 171.7 lb

## 2018-11-27 DIAGNOSIS — I729 Aneurysm of unspecified site: Secondary | ICD-10-CM

## 2018-11-27 DIAGNOSIS — I721 Aneurysm of artery of upper extremity: Secondary | ICD-10-CM | POA: Diagnosis not present

## 2018-11-27 NOTE — Progress Notes (Signed)
REASON FOR CONSULT:    Pseudoaneurysm of left radial artery.  The consult is requested by Dr. Quay Burow.  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  This has been gradually enlarging and I have recommended elective repair.  The neck appears fairly large and the aneurysm measures 2.6 cm in maximum diameter.  I have explained that we may be able to repair the pseudoaneurysm by simply repairing the artery or he may potentially require a small vein patch.  His surgery has been scheduled for 11/29/2018 with Dr. Donnetta Hutching.  I have discussed the indications for the procedure the potential complications and he is agreeable to proceed.  Deitra Mayo, MD, FACS Beeper [phone] Office: [phone]   HPI:   Robert Burns is a pleasant 78 y.o. male, who underwent cardiac catheterization via a left radial approach on 11/12/2018.  He had severe underlying three-vessel coronary artery disease.  He is undergone a previous coronary revascularization.  He underwent successful PCI and drug-eluting stent placement to the saphenous vein graft to the diagonal and saphenous vein graft to the right coronary artery.  He noted some swelling at the left wrist after the procedure and this is gradually been enlarging.  He was seen at the cardiologist office and sent for vascular consultation.  He denies any weakness in the left hand or significant paresthesias.  He feels that the aneurysm has been gradually enlarging over the last week.  He is on aspirin and Plavix.  He is not on any other blood thinners.  He denies any recent chest pain or chest pressure since his intervention.  Past Medical History:  Diagnosis Date  . Arthritis    "all over" (11/25/2015)  . CAD (coronary artery disease)    a. s/p CABG  06/2002; b. 02/01/15 PCI: DES to prox SVG to PDA, staged PCI of  SVG to Diag in 02/2015; c. 04/2017 Cath/PCI: LM nl, LAD 100ost, 69m, 75d, LCX 60ost, OM2 80, RCA 100ost, RPDA 80, LIMA->LAD ok, VG->D1 patent stent, VG->RPDA patent stent, 50p, VG->OM1->OM2 90p (3.0x24 Synergy DES), 100 between OM1->OM2 (med rx).  . Cancer ([place])    Right Shoulder, Left Leg- BCC, SCC, AND MELANOMA  . Hyperlipidemia   . Hypertension   . Leg pain   . OSA on CPAP   . PAD (peripheral artery disease) ([place])    a. 10/2002 L SFA PTA/BMS; b. 8/17 LE Angio: LEIA 90 (9x40 self exp stent), LSFA short segment prox occlusion (staged PTA/stenting 01/03/2016), patent mid stent, RSFA 35m (staged PTA/DEB 02/14/2016).  . Presence of permanent cardiac pacemaker   . SSS (sick sinus syndrome) ([place])    a. s/p PPM in 2007 with gen change 04/2015 - Medtronic Adapta ADDRL1, ser # [id number].  . Type II diabetes mellitus ([place])   . Urgency of urination     Family History  Problem Relation Age of Onset  . Coronary artery disease Mother   . Heart attack Mother   . Hypertension Mother   . Heart disease Mother        Open  Heart surgery  . Diabetes Father   . Heart disease Father   . Hyperlipidemia Father   . Heart attack Father   . Hypertension Sister   . Diabetes Sister   . Heart disease Sister     SOCIAL HISTORY: Social History   Socioeconomic History  . Marital  status: Married    Spouse name: Not on file  . Number of children: Not on file  . Years of education: Not on file  . Highest education level: Not on file  Occupational History  . Not on file  Social Needs  . Financial resource strain: Not on file  . Food insecurity    Worry: Not on file    Inability: Not on file  . Transportation needs    Medical: Not on file    Non-medical: Not on file  Tobacco Use  . Smoking status: Former Smoker    Years: 10.00    Types: Pipe    Quit date: 1973    Years since quitting: 47.6  . Smokeless tobacco: Never Used  . Tobacco comment: "quit smoking in 1973"  Substance and Sexual Activity   . Alcohol use: No  . Drug use: No  . Sexual activity: Not Currently  Lifestyle  . Physical activity    Days per week: Not on file    Minutes per session: Not on file  . Stress: Not on file  Relationships  . Social Herbalist on phone: Not on file    Gets together: Not on file    Attends religious service: Not on file    Active member of club or organization: Not on file    Attends meetings of clubs or organizations: Not on file    Relationship status: Not on file  . Intimate partner violence    Fear of current or ex partner: Not on file    Emotionally abused: Not on file    Physically abused: Not on file    Forced sexual activity: Not on file  Other Topics Concern  . Not on file  Social History Narrative  . Not on file    Allergies  Allergen Reactions  . Carvedilol Itching  . Peanut-Containing Drug Products Anaphylaxis and Other (See Comments)    Tongue swelling is severe Other reaction(s): Unknown  . Ace Inhibitors Other (See Comments), Rash and Hives    Cough Other reaction(s): Unknown Cough   . Diltiazem Hcl Other (See Comments)    Unknown Other reaction(s): Other (See Comments), Unknown Unknown   . Duloxetine Hcl     Other reaction(s): Other Urinary frequency  . Meloxicam Other (See Comments)    GI upset Other reaction(s): Other (See Comments), Unknown GI upset GI upset GI BLEED   . Clonidine Hcl Other (See Comments)    Patch only - skin irritation    Current Outpatient Medications  Medication Sig Dispense Refill  . amLODipine (NORVASC) 10 MG tablet Take 10 mg by mouth daily.     [name] aspirin 81 MG chewable tablet Chew 1 tablet (81 mg total) by mouth daily.    [name] atorvastatin (LIPITOR) 80 MG tablet Take 1 tablet (80 mg total) by mouth daily at 6 PM. 90 tablet 1  . Cholecalciferol (VITAMIN D3) 2000 units capsule Take 2,000 Units by mouth 2 (two) times daily.    . clopidogrel (PLAVIX) 75 MG tablet Take 1 tablet (75 mg total) by mouth daily with  breakfast. (Patient taking differently: Take 75 mg by mouth every morning. ) 90 tablet 3  . glipiZIDE (GLUCOTROL) 5 MG tablet Take 5 mg by mouth daily with breakfast.     . hydrALAZINE (APRESOLINE) 25 MG tablet Take 25 mg by mouth 2 (two) times daily.     . hydrochlorothiazide (HYDRODIURIL) 25 MG tablet Take 25 mg by mouth  every morning.    . isosorbide mononitrate (IMDUR) 60 MG 24 hr tablet Take 1 tablet (60 mg total) by mouth daily. 90 tablet 3  . metFORMIN (GLUCOPHAGE) 1000 MG tablet Take 1 tablet (1,000 mg total) by mouth 2 (two) times daily with a meal.    . metoprolol tartrate (LOPRESSOR) 100 MG tablet Take 1 tablet (100 mg total) by mouth 2 (two) times daily. (Patient taking differently: Take 50 mg by mouth 2 (two) times daily. ) 180 tablet 3  . nitroGLYCERIN (NITROSTAT) 0.4 MG SL tablet Place 1 tablet (0.4 mg total) under the tongue every 5 (five) minutes as needed for chest pain. 25 tablet 3  . potassium chloride SA (K-DUR,KLOR-CON) 20 MEQ tablet Take 1 tablet (20 mEq total) by mouth daily. 15 tablet 0  . tamsulosin (FLOMAX) 0.4 MG CAPS capsule Take 0.4 mg by mouth 2 (two) times daily.      No current facility-administered medications for this visit.     REVIEW OF SYSTEMS:  [X]  denotes positive finding, [ ]  denotes negative finding Cardiac  Comments:  Chest pain or chest pressure: X  prior to intervention  Shortness of breath upon exertion:    Short of breath when lying flat:    Irregular heart rhythm:        Vascular    Pain in calf, thigh, or hip brought on by ambulation:    Pain in feet at night that wakes you up from your sleep:     Blood clot in your veins:    Leg swelling:         Pulmonary    Oxygen at home:    Productive cough:     Wheezing:         Neurologic    Sudden weakness in arms or legs:     Sudden numbness in arms or legs:     Sudden onset of difficulty speaking or slurred speech:    Temporary loss of vision in one eye:     Problems with dizziness:          Gastrointestinal    Blood in stool:     Vomited blood:         Genitourinary    Burning when urinating:     Blood in urine:        Psychiatric    Major depression:         Hematologic    Bleeding problems:    Problems with blood clotting too easily:        Skin    Rashes or ulcers:        Constitutional    Fever or chills:     PHYSICAL EXAM:   Vitals:   11/27/18 1218  BP: 109/69  Pulse: 85  Resp: 12  Temp: 97.9 F (36.6 C)  TempSrc: Temporal  SpO2: 100%  Weight: 171 lb 11.2 oz (77.9 kg)  Height: 5\' 7"  (1.702 m)    GENERAL: The patient is a well-nourished male, in no acute distress. The vital signs are documented above. CARDIAC: There is a regular rate and rhythm.  VASCULAR: I do not detect carotid bruits. He has a pseudoaneurysm in his left wrist with a palpable pulse distal to this.    PULMONARY: There is good air exchange bilaterally without wheezing or rales. ABDOMEN: Soft and non-tender with normal pitched bowel sounds.  MUSCULOSKELETAL: There are no major deformities or cyanosis. NEUROLOGIC: No focal weakness or paresthesias are detected. SKIN: There are no  ulcers or rashes noted. PSYCHIATRIC: The patient has a normal affect.  DATA:    DUPLEX LEFT RADIAL ARTERY: I have independently interpreted his duplex scan today.  He has a pseudoaneurysm originating off of the distal left radial artery.  The maximum diameter of the aneurysm is 2.6 cm.  The neck of the aneurysm measures 0.23 cm in width and 0.5 cm in length.

## 2018-11-27 NOTE — Progress Notes (Signed)
Patient instructed to hold PLAVIX for surgery on 11/29/2018. Claims he did take dose today 11/27/2018. Instructed to be at hospital at 6:30 am 11/29/2018 will need same day nasal swab.

## 2018-11-27 NOTE — H&P (View-Only) (Signed)
REASON FOR CONSULT:    Pseudoaneurysm of left radial artery.  The consult is requested by Dr. Quay Burow.  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  This has been gradually enlarging and I have recommended elective repair.  The neck appears fairly large and the aneurysm measures 2.6 cm in maximum diameter.  I have explained that we may be able to repair the pseudoaneurysm by simply repairing the artery or he may potentially require a small vein patch.  His surgery has been scheduled for 11/29/2018 with Dr. Donnetta Hutching.  I have discussed the indications for the procedure the potential complications and he is agreeable to proceed.  Deitra Mayo, MD, FACS Beeper [phone] Office: [phone]   HPI:   Robert Burns is a pleasant 78 y.o. male, who underwent cardiac catheterization via a left radial approach on 11/12/2018.  He had severe underlying three-vessel coronary artery disease.  He is undergone a previous coronary revascularization.  He underwent successful PCI and drug-eluting stent placement to the saphenous vein graft to the diagonal and saphenous vein graft to the right coronary artery.  He noted some swelling at the left wrist after the procedure and this is gradually been enlarging.  He was seen at the cardiologist office and sent for vascular consultation.  He denies any weakness in the left hand or significant paresthesias.  He feels that the aneurysm has been gradually enlarging over the last week.  He is on aspirin and Plavix.  He is not on any other blood thinners.  He denies any recent chest pain or chest pressure since his intervention.  Past Medical History:  Diagnosis Date  . Arthritis    "all over" (11/25/2015)  . CAD (coronary artery disease)    a. s/p CABG  06/2002; b. 02/01/15 PCI: DES to prox SVG to PDA, staged PCI of  SVG to Diag in 02/2015; c. 04/2017 Cath/PCI: LM nl, LAD 100ost, 52m, 75d, LCX 60ost, OM2 80, RCA 100ost, RPDA 80, LIMA->LAD ok, VG->D1 patent stent, VG->RPDA patent stent, 50p, VG->OM1->OM2 90p (3.0x24 Synergy DES), 100 between OM1->OM2 (med rx).  . Cancer ([place])    Right Shoulder, Left Leg- BCC, SCC, AND MELANOMA  . Hyperlipidemia   . Hypertension   . Leg pain   . OSA on CPAP   . PAD (peripheral artery disease) ([place])    a. 10/2002 L SFA PTA/BMS; b. 8/17 LE Angio: LEIA 90 (9x40 self exp stent), LSFA short segment prox occlusion (staged PTA/stenting 01/03/2016), patent mid stent, RSFA 43m (staged PTA/DEB 02/14/2016).  . Presence of permanent cardiac pacemaker   . SSS (sick sinus syndrome) ([place])    a. s/p PPM in 2007 with gen change 04/2015 - Medtronic Adapta ADDRL1, ser # [id number] H.  . Type II diabetes mellitus ([place])   . Urgency of urination     Family History  Problem Relation Age of Onset  . Coronary artery disease Mother   . Heart attack Mother   . Hypertension Mother   . Heart disease Mother        Open  Heart surgery  . Diabetes Father   . Heart disease Father   . Hyperlipidemia Father   . Heart attack Father   . Hypertension Sister   . Diabetes Sister   . Heart disease Sister     SOCIAL HISTORY: Social History   Socioeconomic History  . Marital  status: Married    Spouse name: Not on file  . Number of children: Not on file  . Years of education: Not on file  . Highest education level: Not on file  Occupational History  . Not on file  Social Needs  . Financial resource strain: Not on file  . Food insecurity    Worry: Not on file    Inability: Not on file  . Transportation needs    Medical: Not on file    Non-medical: Not on file  Tobacco Use  . Smoking status: Former Smoker    Years: 10.00    Types: Pipe    Quit date: 1973    Years since quitting: 47.6  . Smokeless tobacco: Never Used  . Tobacco comment: "quit smoking in 1973"  Substance and Sexual Activity   . Alcohol use: No  . Drug use: No  . Sexual activity: Not Currently  Lifestyle  . Physical activity    Days per week: Not on file    Minutes per session: Not on file  . Stress: Not on file  Relationships  . Social Herbalist on phone: Not on file    Gets together: Not on file    Attends religious service: Not on file    Active member of club or organization: Not on file    Attends meetings of clubs or organizations: Not on file    Relationship status: Not on file  . Intimate partner violence    Fear of current or ex partner: Not on file    Emotionally abused: Not on file    Physically abused: Not on file    Forced sexual activity: Not on file  Other Topics Concern  . Not on file  Social History Narrative  . Not on file    Allergies  Allergen Reactions  . Carvedilol Itching  . Peanut-Containing Drug Products Anaphylaxis and Other (See Comments)    Tongue swelling is severe Other reaction(s): Unknown  . Ace Inhibitors Other (See Comments), Rash and Hives    Cough Other reaction(s): Unknown Cough   . Diltiazem Hcl Other (See Comments)    Unknown Other reaction(s): Other (See Comments), Unknown Unknown   . Duloxetine Hcl     Other reaction(s): Other Urinary frequency  . Meloxicam Other (See Comments)    GI upset Other reaction(s): Other (See Comments), Unknown GI upset GI upset GI BLEED   . Clonidine Hcl Other (See Comments)    Patch only - skin irritation    Current Outpatient Medications  Medication Sig Dispense Refill  . amLODipine (NORVASC) 10 MG tablet Take 10 mg by mouth daily.     [name] aspirin 81 MG chewable tablet Chew 1 tablet (81 mg total) by mouth daily.    [name] atorvastatin (LIPITOR) 80 MG tablet Take 1 tablet (80 mg total) by mouth daily at 6 PM. 90 tablet 1  . Cholecalciferol (VITAMIN D3) 2000 units capsule Take 2,000 Units by mouth 2 (two) times daily.    . clopidogrel (PLAVIX) 75 MG tablet Take 1 tablet (75 mg total) by mouth daily with  breakfast. (Patient taking differently: Take 75 mg by mouth every morning. ) 90 tablet 3  . glipiZIDE (GLUCOTROL) 5 MG tablet Take 5 mg by mouth daily with breakfast.     . hydrALAZINE (APRESOLINE) 25 MG tablet Take 25 mg by mouth 2 (two) times daily.     . hydrochlorothiazide (HYDRODIURIL) 25 MG tablet Take 25 mg by mouth  every morning.    . isosorbide mononitrate (IMDUR) 60 MG 24 hr tablet Take 1 tablet (60 mg total) by mouth daily. 90 tablet 3  . metFORMIN (GLUCOPHAGE) 1000 MG tablet Take 1 tablet (1,000 mg total) by mouth 2 (two) times daily with a meal.    . metoprolol tartrate (LOPRESSOR) 100 MG tablet Take 1 tablet (100 mg total) by mouth 2 (two) times daily. (Patient taking differently: Take 50 mg by mouth 2 (two) times daily. ) 180 tablet 3  . nitroGLYCERIN (NITROSTAT) 0.4 MG SL tablet Place 1 tablet (0.4 mg total) under the tongue every 5 (five) minutes as needed for chest pain. 25 tablet 3  . potassium chloride SA (K-DUR,KLOR-CON) 20 MEQ tablet Take 1 tablet (20 mEq total) by mouth daily. 15 tablet 0  . tamsulosin (FLOMAX) 0.4 MG CAPS capsule Take 0.4 mg by mouth 2 (two) times daily.      No current facility-administered medications for this visit.     REVIEW OF SYSTEMS:  [X]  denotes positive finding, [ ]  denotes negative finding Cardiac  Comments:  Chest pain or chest pressure: X  prior to intervention  Shortness of breath upon exertion:    Short of breath when lying flat:    Irregular heart rhythm:        Vascular    Pain in calf, thigh, or hip brought on by ambulation:    Pain in feet at night that wakes you up from your sleep:     Blood clot in your veins:    Leg swelling:         Pulmonary    Oxygen at home:    Productive cough:     Wheezing:         Neurologic    Sudden weakness in arms or legs:     Sudden numbness in arms or legs:     Sudden onset of difficulty speaking or slurred speech:    Temporary loss of vision in one eye:     Problems with dizziness:          Gastrointestinal    Blood in stool:     Vomited blood:         Genitourinary    Burning when urinating:     Blood in urine:        Psychiatric    Major depression:         Hematologic    Bleeding problems:    Problems with blood clotting too easily:        Skin    Rashes or ulcers:        Constitutional    Fever or chills:     PHYSICAL EXAM:   Vitals:   11/27/18 1218  BP: 109/69  Pulse: 85  Resp: 12  Temp: 97.9 F (36.6 C)  TempSrc: Temporal  SpO2: 100%  Weight: 171 lb 11.2 oz (77.9 kg)  Height: 5\' 7"  (1.702 m)    GENERAL: The patient is a well-nourished male, in no acute distress. The vital signs are documented above. CARDIAC: There is a regular rate and rhythm.  VASCULAR: I do not detect carotid bruits. He has a pseudoaneurysm in his left wrist with a palpable pulse distal to this.    PULMONARY: There is good air exchange bilaterally without wheezing or rales. ABDOMEN: Soft and non-tender with normal pitched bowel sounds.  MUSCULOSKELETAL: There are no major deformities or cyanosis. NEUROLOGIC: No focal weakness or paresthesias are detected. SKIN: There are no  ulcers or rashes noted. PSYCHIATRIC: The patient has a normal affect.  DATA:    DUPLEX LEFT RADIAL ARTERY: I have independently interpreted his duplex scan today.  He has a pseudoaneurysm originating off of the distal left radial artery.  The maximum diameter of the aneurysm is 2.6 cm.  The neck of the aneurysm measures 0.23 cm in width and 0.5 cm in length.

## 2018-11-28 ENCOUNTER — Other Ambulatory Visit: Payer: Self-pay

## 2018-11-28 ENCOUNTER — Encounter (HOSPITAL_COMMUNITY): Payer: Self-pay | Admitting: Vascular Surgery

## 2018-11-28 ENCOUNTER — Other Ambulatory Visit (HOSPITAL_COMMUNITY)
Admission: RE | Admit: 2018-11-28 | Discharge: 2018-11-28 | Disposition: A | Discharge status: Home / Self Care | Payer: Medicare HMO | Source: Ambulatory Visit | Attending: Vascular Surgery | Admitting: Vascular Surgery

## 2018-11-28 DIAGNOSIS — Z20828 Contact with and (suspected) exposure to other viral communicable diseases: Secondary | ICD-10-CM | POA: Insufficient documentation

## 2018-11-28 DIAGNOSIS — I10 Essential (primary) hypertension: Secondary | ICD-10-CM | POA: Diagnosis not present

## 2018-11-28 DIAGNOSIS — I25709 Atherosclerosis of coronary artery bypass graft(s), unspecified, with unspecified angina pectoris: Secondary | ICD-10-CM | POA: Diagnosis not present

## 2018-11-28 DIAGNOSIS — Z01812 Encounter for preprocedural laboratory examination: Secondary | ICD-10-CM | POA: Diagnosis not present

## 2018-11-28 DIAGNOSIS — E1151 Type 2 diabetes mellitus with diabetic peripheral angiopathy without gangrene: Secondary | ICD-10-CM | POA: Diagnosis not present

## 2018-11-28 DIAGNOSIS — G4733 Obstructive sleep apnea (adult) (pediatric): Secondary | ICD-10-CM | POA: Diagnosis not present

## 2018-11-28 LAB — SARS CORONAVIRUS 2 (TAT 6-24 HRS): SARS Coronavirus 2: NEGATIVE

## 2018-11-28 NOTE — Progress Notes (Signed)
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  He is s/p DES to LAD x2 on 11/12/18. He had follow-up with Dr. Gwenlyn Found on 11/20/18. Chest pain resolved, but still with some tightness (hasn't needed Nitro), so Imdur increased. He was found to have a left radial pseudoaneurysm for the LHC puncture site, so patient referred to [facility] for surgical intervention. Per [facility], patient continuing Plavix except not to take on the morning of surgery. He will continue ASA even on the day of surgery.      History includes former smoker (quit 1973), CAD (CABG 06/24/02: LIMA-LAD, SVG-D1, SVG-[place]amus-OM1, SVG-PDA; s/p DES SVG-PDA 02/01/15; DES DIAG 03/01/15; DES OM2 04/12/17; DES distal LAD and mid LAD 02/08/18 after declined for redo CABG due to no favorable targets; DES SVG-D1 and SVG-RCA 8/420), SSS (s/p Medtronic PPM 10/13/05; generator change 04/23/15), PAD (s/p left SFA stent 10/30/02 & 01/03/16; left EIA stent 11/25/15; right SFA arthrectomy/stent 02/14/16), epitheloid hemangioendothelioma (s/p resection of left SFA with interposition 6 mm GoreTex graft 06/02/10 with repeat resection 09/22/10 due to positive margins, saw HEM-ONC/RAD-ONC, no chemoradiation), skin cancer (s/p excisions for BCC, SCC, melanoma), DM2, OSA (CPAP).    Negative COVID-19 test 11/11/18, but needs updated test for surgery. Patient just seen by [facility] and added to OR schedule 11/27/18. Attempting to get patient to get a COVID-19 test on 11/28/18, but unsure if he can make it before closing due to appointment with Dr. Laurann Montana. If unable to get tested on 11/28/18, then he is to arrive early for same day testing. Medtronic PPM perioperative device Rx still  pending.   PROVIDERS: [name], MD is PCP  Quay Burow, MD is primary cardiologist. PPM is followed by Sanda Klein, MD.   LABS: Labs as of 11/13/18 include: Lab Results  Component Value Date   WBC 12.9 (H) 11/13/2018   HGB 12.5 (L) 11/13/2018   HCT 38.7 (L) 11/13/2018   PLT 171 11/13/2018   GLUCOSE 177 (H) 11/13/2018   ALT 20 11/11/2018   AST 17 11/11/2018   NA 138 11/13/2018   K 3.9 11/13/2018   CL 107 11/13/2018   CREATININE 0.85 11/13/2018   BUN 17 11/13/2018   CO2 23 11/13/2018   HGBA1C 8.2 (H) 11/11/2018      IMAGES: CXR 11/11/18: IMPRESSION: Post CABG. No acute abnormalities.   EKG: 11/13/18: Atrial-paced rhythm with prolonged AV conduction Right bundle branch block Abnormal ECG No significant change since last tracing Confirmed by End, Christopher [phone]) on 11/13/2018 9:58:48 PM   CV: Arterial US, LUE 11/27/18: Summary: Left: Pseudoaneurysm arising from distal radial artery measuring       approximately 1.7 cm x 2.6 cm in diameter. The neck measures       0.23 cm in width and 0.50 cm in length.  Cardiac cath 11/12/18: Conclusions:  Mid LM lesion is 50% stenosed.  Ost LAD to Prox LAD lesion is 100% stenosed.  Ost Cx lesion is 40% stenosed.  Prox Cx lesion is 30% stenosed.  Ost 3rd Mrg to 3rd Mrg lesion is 99% stenosed.  Ost RCA to Prox RCA lesion is 100% stenosed.  Ost Ramus lesion is  95% stenosed.  Dist Graft lesion is 50% stenosed.  Prox Graft lesion is 95% stenosed.  Origin to Mid Graft lesion before Prox LAD is 100% stenosed.  Prox Graft to Dist Graft lesion between Ramus and 2nd Mrg is 100% stenosed.  Mid Graft to Dist Graft lesion is 90% stenosed.  Post intervention, there is a 0% residual stenosis.  A drug-eluting stent was successfully placed using a STENT RESOLUTE ONYX 3.0X30.  The left ventricular systolic function is normal.  LV end diastolic pressure is normal.  The left ventricular ejection fraction is 55-65%  by visual estimate.  Mid Cx lesion is 90% stenosed.  Origin to Prox Graft lesion is 60% stenosed.  Post intervention, there is a 0% residual stenosis.  A drug-eluting stent was successfully placed using a STENT RESOLUTE [id number].  Mid LAD-2 lesion is 20% stenosed.  Non-stenotic Mid LAD-1 lesion.  Dist LAD lesion is 70% stenosed. 1.  Severe underlying three-vessel coronary artery disease with patent LIMA to LAD, patent SVG to diagonal with severe proximal in-stent restenosis, patent SVG to RCA with moderate in-stent restenosis proximally and severe new stenosis in the mid to distal segment, and occluded SVG to ramus.  Significant in-stent restenosis in the distal LAD stent which was placed last year. 2.  Normal LV systolic function and left ventricular end-diastolic pressure. 3.  Successful PCI and drug-eluting stent placement to SVG to diagonal and SVG to RCA. Recommendations: Recommend medical therapy for the rest of his CAD including the distal LAD stent. See recommendations below.  Echo 02/07/18: Study Conclusions - Left ventricle: Abnormal septal motion and posterior basal   hypokinesis. The cavity size was mildly dilated. Wall thickness   was normal. The estimated ejection fraction was 55%. Left   ventricular diastolic function parameters were normal. - Left atrium: The atrium was moderately dilated. - Right ventricle: The cavity size was mildly dilated. - Right atrium: The atrium was mildly dilated.   Past Medical History:  Diagnosis Date  . Arthritis    "all over" (11/25/2015)  . CAD (coronary artery disease)    a. s/p CABG  06/2002; b. 02/01/15 PCI: DES to prox SVG to PDA, staged PCI of SVG to Diag in 02/2015; c. 04/2017 Cath/PCI: LM nl, LAD 100ost, 69m, 75d, LCX 60ost, OM2 80, RCA 100ost, RPDA 80, LIMA->LAD ok, VG->D1 patent stent, VG->RPDA patent stent, 50p, VG->OM1->OM2 90p (3.0x24 Synergy DES), 100 between OM1->OM2 (med rx).  . Cancer ([place])    Right Shoulder, Left  Leg- BCC, SCC, AND MELANOMA  . Epithelioid hemangioendothelioma    s/p resection of left SFA/mass with interposition 6 mm GoreTex graft 06/02/10, s/p repeat resection for positive margins 09/22/10  . Hyperlipidemia   . Hypertension   . Leg pain   . OSA on CPAP   . PAD (peripheral artery disease) ([place])    a. 10/2002 L SFA PTA/BMS; b. 8/17 LE Angio: LEIA 90 (9x40 self exp stent), LSFA short segment prox occlusion (staged PTA/stenting 01/03/2016), patent mid stent, RSFA 32m (staged PTA/DEB 02/14/2016).  . Presence of permanent cardiac pacemaker   . SSS (sick sinus syndrome) ([place])    a. s/p PPM in 2007 with gen change 04/2015 - Medtronic Adapta ADDRL1, ser # [id number].  . Type II diabetes mellitus ([place])   . Urgency of urination     Past Surgical History:  Procedure Laterality Date  . CARDIAC CATHETERIZATION N/A 02/01/2015   Procedure: Left Heart Cath and Coronary Angiography;  Surgeon: Lorretta Harp, MD;  Location: [facility]  INVASIVE CV LAB;  Service: Cardiovascular;  Laterality: N/A;  . CARDIAC CATHETERIZATION N/A 02/01/2015   Procedure: Coronary Stent Intervention;  Surgeon: Lorretta Harp, MD;  Location: [place] CV LAB;  Service: Cardiovascular;  Laterality: N/A;  . CARDIAC CATHETERIZATION  06/2002   "just before bypass OR"  . CARDIAC CATHETERIZATION N/A 03/01/2015   Procedure: Coronary Stent Intervention;  Surgeon: Lorretta Harp, MD;  Location: [place] CV LAB;  Service: Cardiovascular;  Laterality: N/A;  . CARDIAC CATHETERIZATION  02/06/2018  . CORONARY ANGIOPLASTY    . CORONARY ARTERY BYPASS GRAFT  06/2002   x5, LIMA-LAD;VG- Diag; seq VG- ramus & OM branch; VG-PDA  . CORONARY STENT INTERVENTION N/A 04/12/2017   Procedure: CORONARY STENT INTERVENTION;  Surgeon: Lorretta Harp, MD;  Location: [place] CV LAB;  Service: Cardiovascular;  Laterality: N/A;  . CORONARY STENT INTERVENTION N/A 02/08/2018   Procedure: CORONARY STENT INTERVENTION;  Surgeon: Jettie Booze, MD;   Location: [place] CV LAB;  Service: Cardiovascular;  Laterality: N/A;  . CORONARY STENT INTERVENTION N/A 11/12/2018   Procedure: CORONARY STENT INTERVENTION;  Surgeon: Wellington Hampshire, MD;  Location: [place] CV LAB;  Service: Cardiovascular;  Laterality: N/A;  . EP IMPLANTABLE DEVICE N/A 04/23/2015   Procedure: PPM Generator Changeout;  Surgeon: Sanda Klein, MD;  Location: [place] CV LAB;  Service: Cardiovascular;  Laterality: N/A;  . FEMORAL ARTERY STENT Left ~ 2014   "taken out of my leg; couldn' catorgorize what kind so the put it under all 3"; cataroziepitheloid hemanioendotheliomau  . FOOT FRACTURE SURGERY Left 1973  . FRACTURE SURGERY    . INSERT / REPLACE / REMOVE PACEMAKER  10/13/05   right side, medtronic Adapta  . KNEE HARDWARE REMOVAL Right 1950's   "3-4 months after the insertion"  . KNEE SURGERY Right 1950's   "broke my lower leg; had to put pin in my knee to keep lower leg in place til it healed"  . LEFT HEART CATH AND CORS/GRAFTS ANGIOGRAPHY N/A 04/12/2017   Procedure: LEFT HEART CATH AND CORS/GRAFTS ANGIOGRAPHY;  Surgeon: Lorretta Harp, MD;  Location: [place] CV LAB;  Service: Cardiovascular;  Laterality: N/A;  . LEFT HEART CATH AND CORS/GRAFTS ANGIOGRAPHY N/A 02/06/2018   Procedure: LEFT HEART CATH AND CORS/GRAFTS ANGIOGRAPHY;  Surgeon: Troy Sine, MD;  Location: [place] CV LAB;  Service: Cardiovascular;  Laterality: N/A;  . LEFT HEART CATH AND CORS/GRAFTS ANGIOGRAPHY N/A 11/12/2018   Procedure: LEFT HEART CATH AND CORS/GRAFTS ANGIOGRAPHY;  Surgeon: Wellington Hampshire, MD;  Location: [place] CV LAB;  Service: Cardiovascular;  Laterality: N/A;  . PERIPHERAL VASCULAR CATHETERIZATION N/A 11/25/2015   Procedure: Lower Extremity Angiography;  Surgeon: Lorretta Harp, MD;  Location: [place] CV LAB;  Service: Cardiovascular;  Laterality: N/A;  . PERIPHERAL VASCULAR CATHETERIZATION Left 11/25/2015   Procedure: Peripheral Vascular Intervention;   Surgeon: Lorretta Harp, MD;  Location: [place] CV LAB;  Service: Cardiovascular;  Laterality: Left;  external iliac  . PERIPHERAL VASCULAR CATHETERIZATION N/A 01/03/2016   Procedure: Lower Extremity Angiography;  Surgeon: Lorretta Harp, MD;  Location: [place] CV LAB;  Service: Cardiovascular;  Laterality: N/A;  . PERIPHERAL VASCULAR CATHETERIZATION Left 01/03/2016   Procedure: Peripheral Vascular Intervention;  Surgeon: Lorretta Harp, MD;  Location: [place] CV LAB;  Service: Cardiovascular;  Laterality: Left;  SFA  . PERIPHERAL VASCULAR CATHETERIZATION Right 02/14/2016   Procedure: Peripheral Vascular Atherectomy;  Surgeon: Lorretta Harp, MD;  Location: [place]  CV LAB;  Service: Cardiovascular;  Laterality: Right;  SFA  . POPLITEAL ARTERY STENT  01/03/2016   Contralateral access with a 7 French crossover sheath (second order catheter placement)  . TONSILLECTOMY AND ADENOIDECTOMY    . TUMOR EXCISION Right ~ 2005   cancerous tumor removed from shoulder  . TUMOR EXCISION Right ~ 2000   benign tumor removed from under shoulder  . TUMOR EXCISION Left 06/02/2010   resection of Lt SFA wth interposition of Gore-Tex graft    MEDICATIONS: No current facility-administered medications for this encounter.    [name] amLODipine (NORVASC) 10 MG tablet  . aspirin EC 81 MG tablet  . atorvastatin (LIPITOR) 80 MG tablet  . Cholecalciferol (VITAMIN D3) 2000 units capsule  . clopidogrel (PLAVIX) 75 MG tablet  . glipiZIDE (GLUCOTROL) 5 MG tablet  . hydrALAZINE (APRESOLINE) 25 MG tablet  . hydrochlorothiazide (HYDRODIURIL) 25 MG tablet  . isosorbide mononitrate (IMDUR) 60 MG 24 hr tablet  . metFORMIN (GLUCOPHAGE) 1000 MG tablet  . metoprolol tartrate (LOPRESSOR) 100 MG tablet  . nitroGLYCERIN (NITROSTAT) 0.4 MG SL tablet  . potassium chloride SA (K-DUR,KLOR-CON) 20 MEQ tablet  . tamsulosin (FLOMAX) 0.4 MG CAPS capsule  . aspirin 81 MG chewable tablet     Myra Gianotti,  PA-C Surgical Short Stay/Anesthesiology [facility] Phone [phone] [facility] Phone [phone] 11/28/2018 2:11 PM

## 2018-11-28 NOTE — Progress Notes (Addendum)
Robert Burns denies chest pain or shortness of breath at this time.  Patient does experience chest tightness  at times-" not bad enough to take NTG." Dr Gwenlyn Found is aware.  Patient reports that he had chest tightness 11/27/2018. Patient was instructed to stop Plavix 11/27/2018 and to continue to take Aspirin, by Gomez Cleverly, RN. Robert Burns denies s/s of Covid.  Patient did not think he would be able to get Covid test today due to an appointment. Patient is seeing Dr. Laurann Montana , PCP at this time and he should be able to make it to [facility] site before 1515. I instructed patient that if he is able to be tested to arrive at 0710 in am, if unable to be tested to arrive at 0630 in am.  I asked anesthesia, PA- C  to review chart.  I faxed Device Peri OP Device Ordes to [employer] and notified  [employer] Representatives of his surgery.

## 2018-11-28 NOTE — Anesthesia Preprocedure Evaluation (Addendum)
Anesthesia Evaluation  Patient identified by MRN, date of birth, ID band Patient awake    Reviewed: Allergy & Precautions, NPO status , Patient's Chart, lab work & pertinent test results, reviewed documented beta blocker date and time   History of Anesthesia Complications Negative for: history of anesthetic complications  Airway Mallampati: II  TM Distance: >3 FB Neck ROM: Full    Dental  (+) Dental Advisory Given, Upper Dentures   Pulmonary sleep apnea and Continuous Positive Airway Pressure Ventilation , former smoker,    Pulmonary exam normal        Cardiovascular hypertension, Pt. on home beta blockers and Pt. on medications + angina + CAD, + Cardiac Stents, + CABG and + Peripheral Vascular Disease  Normal cardiovascular exam+ pacemaker    '20 Cath - 1. Severe underlying three-vessel coronary artery disease with patent LIMA to LAD, patent SVG to diagonal with severe proximal in-stent restenosis, patent SVG to RCA with moderate in-stent restenosis proximally and severe new stenosis in the mid to distal segment, and occluded SVG to ramus.  Significant in-stent restenosis in the distal LAD stent which was placed last year. 2.  Normal LV systolic function and left ventricular end-diastolic pressure. 3.  Successful PCI and drug-eluting stent placement to SVG to diagonal and SVG to RCA.  '19 TTE - Abnormal septal motion and posterior basal hypokinesis. LV cavity size was mildly dilated. EF 55%. LA was moderately dilated. RV cavity size was mildly dilated. RA was mildly dilated.    Neuro/Psych negative neurological ROS  negative psych ROS   GI/Hepatic negative GI ROS, Neg liver ROS,   Endo/Other  diabetes, Type 2, Oral Hypoglycemic Agents  Renal/GU negative Renal ROS     Musculoskeletal  (+) Arthritis ,   Abdominal   Peds  Hematology negative hematology ROS (+)   Anesthesia Other Findings    Reproductive/Obstetrics                          Anesthesia Physical Anesthesia Plan  ASA: III  Anesthesia Plan: General   Post-op Pain Management:    Induction: Intravenous  PONV Risk Score and Plan: 2 and Treatment may vary due to age or medical condition, Ondansetron and Dexamethasone  Airway Management Planned: Oral ETT  Additional Equipment: None  Intra-op Plan:   Post-operative Plan: Extubation in OR  Informed Consent: I have reviewed the patients History and Physical, chart, labs and discussed the procedure including the risks, benefits and alternatives for the proposed anesthesia with the patient or authorized representative who has indicated his/her understanding and acceptance.     Dental advisory given  Plan Discussed with: CRNA and Anesthesiologist  Anesthesia Plan Comments:       Anesthesia Quick Evaluation

## 2018-11-29 ENCOUNTER — Ambulatory Visit (HOSPITAL_COMMUNITY)
Admission: RE | Admit: 2018-11-29 | Discharge: 2018-11-29 | Disposition: A | Discharge status: Home / Self Care | Payer: Medicare HMO | Attending: Vascular Surgery | Admitting: Vascular Surgery

## 2018-11-29 ENCOUNTER — Encounter (HOSPITAL_COMMUNITY)
Admission: RE | Disposition: A | Discharge status: Home / Self Care | Payer: Self-pay | Source: Home / Self Care | Attending: Vascular Surgery

## 2018-11-29 ENCOUNTER — Encounter (HOSPITAL_COMMUNITY): Payer: Self-pay | Admitting: *Deleted

## 2018-11-29 ENCOUNTER — Ambulatory Visit (HOSPITAL_COMMUNITY): Payer: Medicare HMO | Admitting: Vascular Surgery

## 2018-11-29 ENCOUNTER — Other Ambulatory Visit: Payer: Self-pay

## 2018-11-29 DIAGNOSIS — I10 Essential (primary) hypertension: Secondary | ICD-10-CM | POA: Diagnosis not present

## 2018-11-29 DIAGNOSIS — I2510 Atherosclerotic heart disease of native coronary artery without angina pectoris: Secondary | ICD-10-CM | POA: Diagnosis not present

## 2018-11-29 DIAGNOSIS — I9789 Other postprocedural complications and disorders of the circulatory system, not elsewhere classified: Secondary | ICD-10-CM | POA: Diagnosis not present

## 2018-11-29 DIAGNOSIS — Z951 Presence of aortocoronary bypass graft: Secondary | ICD-10-CM | POA: Insufficient documentation

## 2018-11-29 DIAGNOSIS — Z950 Presence of cardiac pacemaker: Secondary | ICD-10-CM | POA: Diagnosis not present

## 2018-11-29 DIAGNOSIS — I495 Sick sinus syndrome: Secondary | ICD-10-CM | POA: Insufficient documentation

## 2018-11-29 DIAGNOSIS — Z7982 Long term (current) use of aspirin: Secondary | ICD-10-CM | POA: Diagnosis not present

## 2018-11-29 DIAGNOSIS — Z20828 Contact with and (suspected) exposure to other viral communicable diseases: Secondary | ICD-10-CM | POA: Insufficient documentation

## 2018-11-29 DIAGNOSIS — G4733 Obstructive sleep apnea (adult) (pediatric): Secondary | ICD-10-CM | POA: Diagnosis not present

## 2018-11-29 DIAGNOSIS — Z85820 Personal history of malignant melanoma of skin: Secondary | ICD-10-CM | POA: Insufficient documentation

## 2018-11-29 DIAGNOSIS — Z7902 Long term (current) use of antithrombotics/antiplatelets: Secondary | ICD-10-CM | POA: Diagnosis not present

## 2018-11-29 DIAGNOSIS — E1151 Type 2 diabetes mellitus with diabetic peripheral angiopathy without gangrene: Secondary | ICD-10-CM | POA: Insufficient documentation

## 2018-11-29 DIAGNOSIS — E785 Hyperlipidemia, unspecified: Secondary | ICD-10-CM | POA: Insufficient documentation

## 2018-11-29 DIAGNOSIS — I721 Aneurysm of artery of upper extremity: Secondary | ICD-10-CM | POA: Diagnosis not present

## 2018-11-29 DIAGNOSIS — E119 Type 2 diabetes mellitus without complications: Secondary | ICD-10-CM | POA: Diagnosis not present

## 2018-11-29 DIAGNOSIS — Z87891 Personal history of nicotine dependence: Secondary | ICD-10-CM | POA: Insufficient documentation

## 2018-11-29 DIAGNOSIS — Z79899 Other long term (current) drug therapy: Secondary | ICD-10-CM | POA: Diagnosis not present

## 2018-11-29 DIAGNOSIS — Z7984 Long term (current) use of oral hypoglycemic drugs: Secondary | ICD-10-CM | POA: Insufficient documentation

## 2018-11-29 DIAGNOSIS — Z955 Presence of coronary angioplasty implant and graft: Secondary | ICD-10-CM | POA: Diagnosis not present

## 2018-11-29 HISTORY — DX: Neoplasm of uncertain behavior, unspecified: D48.9

## 2018-11-29 HISTORY — PX: FALSE ANEURYSM REPAIR: SHX5152

## 2018-11-29 LAB — BASIC METABOLIC PANEL
Anion gap: 10 (ref 5–15)
BUN: 22 mg/dL (ref 8–23)
CO2: 22 mmol/L (ref 22–32)
Calcium: 9.3 mg/dL (ref 8.9–10.3)
Chloride: 108 mmol/L (ref 98–111)
Creatinine, Ser: 0.96 mg/dL (ref 0.61–1.24)
GFR calc Af Amer: >60 mL/min (ref >60)
GFR calc non Af Amer: >60 mL/min (ref >60)
Glucose, Bld: 81 mg/dL (ref 70–99)
Potassium: 3.9 mmol/L (ref 3.5–5.1)
Sodium: 140 mmol/L (ref 135–145)

## 2018-11-29 LAB — SURGICAL PCR SCREEN
MRSA, PCR: POSITIVE — AB
Staphylococcus aureus: POSITIVE — AB

## 2018-11-29 LAB — CBC
HCT: 40 % (ref 39.0–52.0)
Hemoglobin: 12.6 g/dL — ABNORMAL LOW (ref 13.0–17.0)
MCH: 28.1 pg (ref 26.0–34.0)
MCHC: 31.5 g/dL (ref 30.0–36.0)
MCV: 89.1 fL (ref 80.0–100.0)
Platelets: 171 10*3/uL (ref 150–400)
RBC: 4.49 MIL/uL (ref 4.22–5.81)
RDW: 14.6 % (ref 11.5–15.5)
WBC: 12.6 10*3/uL — ABNORMAL HIGH (ref 4.0–10.5)
nRBC: 0 % (ref 0.0–0.2)

## 2018-11-29 LAB — GLUCOSE, CAPILLARY
Glucose-Capillary: 88 mg/dL (ref 70–99)
Glucose-Capillary: 72 mg/dL (ref 70–99)
Glucose-Capillary: 76 mg/dL (ref 70–99)
Glucose-Capillary: 132 mg/dL — ABNORMAL HIGH (ref 70–99)
Glucose-Capillary: 65 mg/dL — ABNORMAL LOW (ref 70–99)

## 2018-11-29 LAB — SARS CORONAVIRUS 2 BY RT PCR (HOSPITAL ORDER, PERFORMED IN ~~LOC~~ HOSPITAL LAB): SARS Coronavirus 2: NEGATIVE

## 2018-11-29 SURGERY — REPAIR, PSEUDOANEURYSM
Anesthesia: General | Site: Arm Lower | Laterality: Left

## 2018-11-29 MED ORDER — CEFAZOLIN SODIUM-DEXTROSE 2-4 GM/100ML-% IV SOLN
INTRAVENOUS | Status: AC
  Filled 2018-11-29: qty 100

## 2018-11-29 MED ORDER — OXYCODONE HCL 5 MG PO TABS: 5.0000 mg | ORAL_TABLET | Freq: Once | ORAL | Status: DC | PRN

## 2018-11-29 MED ORDER — DEXTROSE 50 % IV SOLN
INTRAVENOUS | Status: AC
  Administered 2018-11-29: 25 mL via INTRAVENOUS
  Filled 2018-11-29: qty 50

## 2018-11-29 MED ORDER — PROPOFOL 10 MG/ML IV BOLUS
INTRAVENOUS | Status: DC | PRN
  Administered 2018-11-29: 100 mg via INTRAVENOUS

## 2018-11-29 MED ORDER — FENTANYL CITRATE (PF) 100 MCG/2ML IJ SOLN
INTRAMUSCULAR | Status: DC | PRN
  Administered 2018-11-29: 25 ug via INTRAVENOUS

## 2018-11-29 MED ORDER — CHLORHEXIDINE GLUCONATE 4 % EX LIQD: 60.0000 mL | Freq: Once | CUTANEOUS | Status: DC

## 2018-11-29 MED ORDER — HYDROCODONE-ACETAMINOPHEN 5-325 MG PO TABS
ORAL_TABLET | ORAL | Status: AC
  Filled 2018-11-29: qty 1

## 2018-11-29 MED ORDER — ONDANSETRON HCL 4 MG/2ML IJ SOLN: 4.0000 mg | Freq: Once | INTRAMUSCULAR | Status: DC | PRN

## 2018-11-29 MED ORDER — DEXTROSE 50 % IV SOLN
25.0000 mL | Freq: Once | INTRAVENOUS | Status: AC
  Administered 2018-11-29: 25 mL via INTRAVENOUS
  Filled 2018-11-29: qty 50

## 2018-11-29 MED ORDER — PHENYLEPHRINE 40 MCG/ML (10ML) SYRINGE FOR IV PUSH (FOR BLOOD PRESSURE SUPPORT)
PREFILLED_SYRINGE | INTRAVENOUS | Status: AC
  Filled 2018-11-29: qty 10

## 2018-11-29 MED ORDER — SODIUM CHLORIDE 0.9 % IV SOLN
INTRAVENOUS | Status: AC
  Filled 2018-11-29: qty 1.2

## 2018-11-29 MED ORDER — ONDANSETRON HCL 4 MG/2ML IJ SOLN
INTRAMUSCULAR | Status: DC | PRN
  Administered 2018-11-29: 4 mg via INTRAVENOUS

## 2018-11-29 MED ORDER — SODIUM CHLORIDE 0.9 % IV SOLN
INTRAVENOUS | Status: DC | PRN
  Administered 2018-11-29: 500 mL

## 2018-11-29 MED ORDER — DEXTROSE 50 % IV SOLN
25.0000 mL | Freq: Once | INTRAVENOUS | Status: AC
  Administered 2018-11-29: 25 mL via INTRAVENOUS

## 2018-11-29 MED ORDER — SODIUM CHLORIDE 0.9 % IV SOLN
INTRAVENOUS | Status: DC | PRN
  Administered 2018-11-29: 50 ug/min via INTRAVENOUS

## 2018-11-29 MED ORDER — SODIUM CHLORIDE 0.9 % IV SOLN: INTRAVENOUS | Status: DC

## 2018-11-29 MED ORDER — LIDOCAINE 2% (20 MG/ML) 5 ML SYRINGE
INTRAMUSCULAR | Status: DC | PRN
  Administered 2018-11-29: 60 mg via INTRAVENOUS

## 2018-11-29 MED ORDER — MUPIROCIN 2 % EX OINT
1.0000 "application " | TOPICAL_OINTMENT | Freq: Once | CUTANEOUS | Status: AC
  Administered 2018-11-29: 08:00:00 1 via TOPICAL

## 2018-11-29 MED ORDER — CEFAZOLIN SODIUM-DEXTROSE 2-4 GM/100ML-% IV SOLN
2.0000 g | INTRAVENOUS | Status: AC
  Administered 2018-11-29: 2 g via INTRAVENOUS

## 2018-11-29 MED ORDER — LIDOCAINE 2% (20 MG/ML) 5 ML SYRINGE
INTRAMUSCULAR | Status: AC
  Filled 2018-11-29: qty 5

## 2018-11-29 MED ORDER — 0.9 % SODIUM CHLORIDE (POUR BTL) OPTIME
TOPICAL | Status: DC | PRN
  Administered 2018-11-29: 1000 mL

## 2018-11-29 MED ORDER — FENTANYL CITRATE (PF) 100 MCG/2ML IJ SOLN: 25.0000 ug | INTRAMUSCULAR | Status: DC | PRN

## 2018-11-29 MED ORDER — OXYCODONE HCL 5 MG/5ML PO SOLN: 5.0000 mg | Freq: Once | ORAL | Status: DC | PRN

## 2018-11-29 MED ORDER — LACTATED RINGERS IV SOLN
INTRAVENOUS | Status: DC
  Administered 2018-11-29: 08:00:00 via INTRAVENOUS

## 2018-11-29 MED ORDER — HYDROCODONE-ACETAMINOPHEN 5-325 MG PO TABS
1.0000 | ORAL_TABLET | Freq: Once | ORAL | Status: AC
  Administered 2018-11-29: 1 via ORAL

## 2018-11-29 MED ORDER — FENTANYL CITRATE (PF) 250 MCG/5ML IJ SOLN
INTRAMUSCULAR | Status: AC
  Filled 2018-11-29: qty 5

## 2018-11-29 MED ORDER — MUPIROCIN 2 % EX OINT
TOPICAL_OINTMENT | CUTANEOUS | Status: AC
  Administered 2018-11-29: 1 via TOPICAL
  Filled 2018-11-29: qty 22

## 2018-11-29 MED ORDER — HYDROCODONE-ACETAMINOPHEN 5-325 MG PO TABS: 1.0000 | ORAL_TABLET | ORAL | 0 refills | Status: DC | PRN

## 2018-11-29 MED ORDER — PROPOFOL 10 MG/ML IV BOLUS
INTRAVENOUS | Status: AC
  Filled 2018-11-29: qty 20

## 2018-11-29 MED ORDER — ONDANSETRON HCL 4 MG/2ML IJ SOLN
INTRAMUSCULAR | Status: AC
  Filled 2018-11-29: qty 2

## 2018-11-29 SURGICAL SUPPLY — 52 items
BANDAGE ESMARK 6X9 LF (GAUZE/BANDAGES/DRESSINGS) ×1 IMPLANT
BNDG ESMARK 6X9 LF (GAUZE/BANDAGES/DRESSINGS) ×2
CANISTER SUCT 3000ML PPV (MISCELLANEOUS) ×2 IMPLANT
CANNULA VESSEL 3MM 2 BLNT TIP (CANNULA) ×2 IMPLANT
CLIP LIGATING EXTRA MED SLVR (CLIP) ×2 IMPLANT
CLIP LIGATING EXTRA SM BLUE (MISCELLANEOUS) ×2 IMPLANT
COVER WAND RF STERILE (DRAPES) IMPLANT
CUFF TOURN SGL QUICK 18X4 (TOURNIQUET CUFF) ×2 IMPLANT
CUFF TOURN SGL QUICK 24 (TOURNIQUET CUFF)
CUFF TOURN SGL QUICK 34 (TOURNIQUET CUFF)
CUFF TOURN SGL QUICK 42 (TOURNIQUET CUFF) IMPLANT
CUFF TRNQT CYL 24X4X16.5-23 (TOURNIQUET CUFF) IMPLANT
CUFF TRNQT CYL 34X4.125X (TOURNIQUET CUFF) IMPLANT
DERMABOND ADVANCED (GAUZE/BANDAGES/DRESSINGS) ×1
DERMABOND ADVANCED .7 DNX12 (GAUZE/BANDAGES/DRESSINGS) ×1 IMPLANT
DRAIN SNY 10X20 3/4 PERF (WOUND CARE) IMPLANT
DRAPE X-RAY CASS 24X20 (DRAPES) IMPLANT
ELECT REM PT RETURN 9FT ADLT (ELECTROSURGICAL) ×2
ELECTRODE REM PT RTRN 9FT ADLT (ELECTROSURGICAL) ×1 IMPLANT
EVACUATOR SILICONE 100CC (DRAIN) IMPLANT
GLOVE BIOGEL PI IND STRL 6.5 (GLOVE) ×1 IMPLANT
GLOVE BIOGEL PI IND STRL 7.0 (GLOVE) ×1 IMPLANT
GLOVE BIOGEL PI INDICATOR 6.5 (GLOVE) ×1
GLOVE BIOGEL PI INDICATOR 7.0 (GLOVE) ×1
GLOVE ECLIPSE 6.5 STRL STRAW (GLOVE) ×2 IMPLANT
GLOVE ECLIPSE 7.5 STRL STRAW (GLOVE) ×2 IMPLANT
GLOVE SS BIOGEL STRL SZ 7.5 (GLOVE) ×1 IMPLANT
GLOVE SUPERSENSE BIOGEL SZ 7.5 (GLOVE) ×1
GOWN STRL REUS W/ TWL LRG LVL3 (GOWN DISPOSABLE) ×3 IMPLANT
GOWN STRL REUS W/ TWL XL LVL3 (GOWN DISPOSABLE) ×1 IMPLANT
GOWN STRL REUS W/TWL LRG LVL3 (GOWN DISPOSABLE) ×3
GOWN STRL REUS W/TWL XL LVL3 (GOWN DISPOSABLE) ×1
KIT BASIN OR (CUSTOM PROCEDURE TRAY) ×2 IMPLANT
KIT TURNOVER KIT B (KITS) ×2 IMPLANT
NS IRRIG 1000ML POUR BTL (IV SOLUTION) ×4 IMPLANT
PACK PERIPHERAL VASCULAR (CUSTOM PROCEDURE TRAY) ×2 IMPLANT
PAD ARMBOARD 7.5X6 YLW CONV (MISCELLANEOUS) ×4 IMPLANT
PADDING CAST COTTON 6X4 STRL (CAST SUPPLIES) IMPLANT
SET COLLECT BLD 21X3/4 12 (NEEDLE) IMPLANT
STAPLER VISISTAT 35W (STAPLE) IMPLANT
STOPCOCK 4 WAY LG BORE MALE ST (IV SETS) IMPLANT
SUT ETHILON 3 0 PS 1 (SUTURE) IMPLANT
SUT PROLENE 5 0 C 1 24 (SUTURE) IMPLANT
SUT PROLENE 6 0 CC (SUTURE) IMPLANT
SUT VIC AB 2-0 CTX 36 (SUTURE) IMPLANT
SUT VIC AB 3-0 SH 27 (SUTURE) ×1
SUT VIC AB 3-0 SH 27X BRD (SUTURE) ×1 IMPLANT
TOWEL GREEN STERILE (TOWEL DISPOSABLE) ×2 IMPLANT
TRAY FOLEY MTR SLVR 16FR STAT (SET/KITS/TRAYS/PACK) IMPLANT
TUBING EXTENTION W/L.L. (IV SETS) IMPLANT
UNDERPAD 30X30 (UNDERPADS AND DIAPERS) ×2 IMPLANT
WATER STERILE IRR 1000ML POUR (IV SOLUTION) ×2 IMPLANT

## 2018-11-29 NOTE — Interval H&P Note (Signed)
History and Physical Interval Note:  11/29/2018 7:14 AM  Robert Burns  has presented today for surgery, with the diagnosis of PSEUDO ANEURYSM LEFT RADIAL ARTERY.  The various methods of treatment have been discussed with the patient and family. After consideration of risks, benefits and other options for treatment, the patient has consented to  Procedure(s): REPAIR FALSE ANEURYSM LEFT RADIAL ARTERY (Left) as a surgical intervention.  The patient's history has been reviewed, patient examined, no change in status, stable for surgery.  I have reviewed the patient's chart and labs.  Questions were answered to the patient's satisfaction.     Curt Jews

## 2018-11-29 NOTE — Transfer of Care (Signed)
Immediate Anesthesia Transfer of Care Note  Patient: Robert Burns  Procedure(s) Performed: REPAIR FALSE ANEURYSM LEFT RADIAL ARTERY (Left Arm Lower)  Patient Location: PACU  Anesthesia Type:General  Level of Consciousness: awake, alert  and oriented  Airway & Oxygen Therapy: Patient connected to face mask oxygen  Post-op Assessment: Post -op Vital signs reviewed and stable  Post vital signs: stable  Last Vitals:  Vitals Value Taken Time  BP 133/64 11/29/18 1116  Temp 36.4 C 11/29/18 1115  Pulse 68 11/29/18 1118  Resp 14 11/29/18 1118  SpO2 98 % 11/29/18 1118  Vitals shown include unvalidated device data.  Last Pain:  Vitals:   11/29/18 1115  TempSrc:   PainSc: (P) Asleep      Patients Stated Pain Goal: 2 ([id number])  Complications: No apparent anesthesia complications

## 2018-11-29 NOTE — Op Note (Signed)
    OPERATIVE REPORT  DATE OF SURGERY: 11/29/2018  PATIENT: Robert Burns, 78 y.o. 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  PHYSICIAN ASSISTANT: Nurse  ANESTHESIA: General  EBL: per anesthesia record  Total I/O In: 500 [I.V.:500] Out: -   BLOOD ADMINISTERED: none  DRAINS: none  SPECIMEN: none  COUNTS CORRECT:  YES  PATIENT DISPOSITION:  PACU - hemodynamically stable  PROCEDURE DETAILS: Patient was taken the operating placed supine position where the area of the left arm was prepped draped you sterile fashion.  A pneumatic tourniquet was placed in the mid upper arm.  The arm was exsanguinated with an Esmarch tourniquet and the pneumatic tourniquet was inflated.  Longitudinal incision was made over the false aneurysm.  The patient did have a great deal of mural thrombus in addition to the false aneurysm.  The mural thrombus was removed.  There was a single hole in the anterior surface of the radial artery.  This was closed with several interrupted 6-0 Prolene sutures.  The pneumatic tourniquet was deflated and there was good hemostasis.  The wound was irrigated with saline.  Hemostasis was obtained electrocautery.  The wound was closed with 3-0 Vicryl in the subcutaneous and subcuticular tissue.  Sterile dressing was applied Ace wrap was applied over the wrist and the patient was transferred to the recovery room in stable condition   [name], M.D., [facility] 11/29/2018 11:11 AM

## 2018-11-29 NOTE — Anesthesia Postprocedure Evaluation (Signed)
Anesthesia Post Note  Patient: Robert Burns  Procedure(s) Performed: REPAIR FALSE ANEURYSM LEFT RADIAL ARTERY (Left Arm Lower)     Patient location during evaluation: PACU Anesthesia Type: General Level of consciousness: awake and alert Pain management: pain level controlled Vital Signs Assessment: post-procedure vital signs reviewed and stable Respiratory status: spontaneous breathing, nonlabored ventilation and respiratory function stable Cardiovascular status: blood pressure returned to baseline and stable Postop Assessment: no apparent nausea or vomiting Anesthetic complications: no    Last Vitals:  Vitals:   11/29/18 1145 11/29/18 1200  BP: 120/60 124/67  Pulse: 61 60  Resp: 13 13  Temp:    SpO2: 96% 98%    Last Pain:  Vitals:   11/29/18 1145  TempSrc:   PainSc: [name]

## 2018-11-29 NOTE — Discharge Instructions (Signed)
Incision Care, Adult °An incision is a cut that a doctor makes in your skin for surgery (for a procedure). Most times, these cuts are closed after surgery. Your cut from surgery may be closed with stitches (sutures), staples, skin glue, or skin tape (adhesive strips). You may need to return to your doctor to have stitches or staples taken out. This may happen many days or many weeks after your surgery. The cut needs to be well cared for so it does not get infected. °How to care for your cut °Cut care ° °· Follow instructions from your doctor about how to take care of your cut. Make sure you: °? Wash your hands with soap and water before you change your bandage (dressing). If you cannot use soap and water, use hand sanitizer. °? Change your bandage as told by your doctor. °? Leave stitches, skin glue, or skin tape in place. They may need to stay in place for 2 weeks or longer. If tape strips get loose and curl up, you may trim the loose edges. Do not remove tape strips completely unless your doctor says it is okay. °· Check your cut area every day for signs of infection. Check for: °? More redness, swelling, or pain. °? More fluid or blood. °? Warmth. °? Pus or a bad smell. °· Ask your doctor how to clean the cut. This may include: °? Using mild soap and water. °? Using a clean towel to pat the cut dry after you clean it. °? Putting a cream or ointment on the cut. Do this only as told by your doctor. °? Covering the cut with a clean bandage. °· Ask your doctor when you can leave the cut uncovered. °· Do not take baths, swim, or use a hot tub until your doctor says it is okay. Ask your doctor if you can take showers. You may only be allowed to take sponge baths for bathing. °Medicines °· If you were prescribed an antibiotic medicine, cream, or ointment, take the antibiotic or put it on the cut as told by your doctor. Do not stop taking or putting on the antibiotic even if your condition gets better. °· Take  over-the-counter and prescription medicines only as told by your doctor. °General instructions °· Limit movement around your cut. This helps healing. °? Avoid straining, lifting, or exercise for the first month, or for as long as told by your doctor. °? Follow instructions from your doctor about going back to your normal activities. °? Ask your doctor what activities are safe. °· Protect your cut from the sun when you are outside for the first 6 months, or for as long as told by your doctor. Put on sunscreen around the scar or cover up the scar. °· Keep all follow-up visits as told by your doctor. This is important. °Contact a doctor if: °· Your have more redness, swelling, or pain around the cut. °· You have more fluid or blood coming from the cut. °· Your cut feels warm to the touch. °· You have pus or a bad smell coming from the cut. °· You have a fever or shaking chills. °· You feel sick to your stomach (nauseous) or you throw up (vomit). °· You are dizzy. °· Your stitches or staples come undone. °Get help right away if: °· You have a red streak coming from your cut. °· Your cut bleeds through the bandage and the bleeding does not stop with gentle pressure. °· The edges of your cut   open up and separate. °· You have very bad (severe) pain. °· You have a rash. °· You are confused. °· You pass out (faint). °· You have trouble breathing and you have a fast heartbeat. °This information is not intended to replace advice given to you by your health care provider. Make sure you discuss any questions you have with your health care provider. °Document Released: 06/19/2011 Document Revised: 08/14/2016 Document Reviewed: 12/03/2015 °Elsevier Patient Education © 2020 [employer] ° °

## 2018-11-30 ENCOUNTER — Encounter (HOSPITAL_COMMUNITY): Payer: Self-pay | Admitting: Vascular Surgery

## 2018-12-02 ENCOUNTER — Encounter: Payer: Medicare HMO | Admitting: Surgery

## 2018-12-02 ENCOUNTER — Ambulatory Visit (HOSPITAL_COMMUNITY): Payer: Medicare HMO

## 2018-12-02 DIAGNOSIS — E1151 Type 2 diabetes mellitus with diabetic peripheral angiopathy without gangrene: Secondary | ICD-10-CM | POA: Diagnosis not present

## 2018-12-02 DIAGNOSIS — Z7984 Long term (current) use of oral hypoglycemic drugs: Secondary | ICD-10-CM | POA: Diagnosis not present

## 2018-12-02 DIAGNOSIS — N401 Enlarged prostate with lower urinary tract symptoms: Secondary | ICD-10-CM | POA: Diagnosis not present

## 2018-12-02 DIAGNOSIS — E1142 Type 2 diabetes mellitus with diabetic polyneuropathy: Secondary | ICD-10-CM | POA: Diagnosis not present

## 2018-12-02 DIAGNOSIS — M170 Bilateral primary osteoarthritis of knee: Secondary | ICD-10-CM | POA: Diagnosis not present

## 2018-12-02 DIAGNOSIS — I25709 Atherosclerosis of coronary artery bypass graft(s), unspecified, with unspecified angina pectoris: Secondary | ICD-10-CM | POA: Diagnosis not present

## 2018-12-02 DIAGNOSIS — I10 Essential (primary) hypertension: Secondary | ICD-10-CM | POA: Diagnosis not present

## 2018-12-02 DIAGNOSIS — M160 Bilateral primary osteoarthritis of hip: Secondary | ICD-10-CM | POA: Diagnosis not present

## 2018-12-03 ENCOUNTER — Telehealth: Payer: Self-pay

## 2018-12-03 NOTE — Telephone Encounter (Signed)
Pt called and said that his bandage came off. He said hat the incision looks good and there is no drainage and does not look infected. He said that he has washed it with soap and water. Advised pt that this was fine and that he should let us know if anything changes. He was told to also keep the area clean and dry and could cover with otc bandage if needed.   York Cerise, CMA

## 2018-12-04 ENCOUNTER — Ambulatory Visit: Payer: Medicare HMO | Admitting: Cardiovascular Disease

## 2018-12-17 ENCOUNTER — Encounter: Payer: Self-pay | Admitting: Vascular Surgery

## 2018-12-17 ENCOUNTER — Ambulatory Visit (INDEPENDENT_AMBULATORY_CARE_PROVIDER_SITE_OTHER): Payer: Self-pay | Admitting: Vascular Surgery

## 2018-12-17 ENCOUNTER — Other Ambulatory Visit: Payer: Self-pay

## 2018-12-17 VITALS — BP 129/76 | HR 83 | Temp 97.6°F | Resp 20 | Ht 67.0 in | Wt 171.9 lb

## 2018-12-17 DIAGNOSIS — I729 Aneurysm of unspecified site: Secondary | ICD-10-CM

## 2018-12-17 NOTE — Progress Notes (Signed)
   Patient name: Robert Burns MRN: [id number] DOB: 07-17-1940 Sex: male  REASON FOR VISIT: Follow-up recent repair of left radial pseudoaneurysm  HPI: Robert Burns is a 78 y.o. male here today for follow-up.  He developed a pseudoaneurysm at the left radial access site for cardiac catheterization.  This had gotten progressively longer and was causing some nerve impingement and pain.  He underwent outpatient repair of this and is here for follow-up.  He reports that he is still having some tingling in his hand but this is markedly improved.  Current Outpatient Medications  Medication Sig Dispense Refill  . amLODipine (NORVASC) 10 MG tablet Take 10 mg by mouth daily.     [name] aspirin 81 MG chewable tablet Chew 1 tablet (81 mg total) by mouth daily.    [name] aspirin EC 81 MG tablet Take 81 mg by mouth daily.    [name] atorvastatin (LIPITOR) 80 MG tablet Take 1 tablet (80 mg total) by mouth daily at 6 PM. 90 tablet 1  . Cholecalciferol (VITAMIN D3) 2000 units capsule Take 2,000 Units by mouth 2 (two) times daily.    . clopidogrel (PLAVIX) 75 MG tablet Take 1 tablet (75 mg total) by mouth daily with breakfast. (Patient taking differently: Take 75 mg by mouth every morning. ) 90 tablet 3  . glipiZIDE (GLUCOTROL) 5 MG tablet Take 5 mg by mouth daily with breakfast.     . hydrALAZINE (APRESOLINE) 25 MG tablet Take 25 mg by mouth 2 (two) times daily.     . hydrochlorothiazide (HYDRODIURIL) 25 MG tablet Take 25 mg by mouth every morning.    . isosorbide mononitrate (IMDUR) 60 MG 24 hr tablet Take 1 tablet (60 mg total) by mouth daily. 90 tablet 3  . metFORMIN (GLUCOPHAGE) 1000 MG tablet Take 1 tablet (1,000 mg total) by mouth 2 (two) times daily with a meal.    . metoprolol tartrate (LOPRESSOR) 100 MG tablet Take 1 tablet (100 mg total) by mouth 2 (two) times daily. 180 tablet 3  . nitroGLYCERIN (NITROSTAT) 0.4 MG SL tablet Place 1 tablet (0.4 mg total) under the tongue every 5  (five) minutes as needed for chest pain. 25 tablet 3  . potassium chloride SA (K-DUR,KLOR-CON) 20 MEQ tablet Take 1 tablet (20 mEq total) by mouth daily. 15 tablet 0  . tamsulosin (FLOMAX) 0.4 MG CAPS capsule Take 0.4 mg by mouth 2 (two) times daily.     [name] HYDROcodone-acetaminophen (NORCO) 5-325 MG tablet Take 1 tablet by mouth every 4 (four) hours as needed for moderate pain. (Patient not taking: Reported on 12/17/2018) 10 tablet 0   No current facility-administered medications for this visit.      PHYSICAL EXAM: Vitals:   12/17/18 1509  BP: 129/76  Pulse: 83  Resp: 20  Temp: 97.6 F (36.4 C)  SpO2: 98%  Weight: 78 kg  Height: 5\' 7"  (1.702 m)    GENERAL: The patient is a well-nourished male, in no acute distress. The vital signs are documented above. Left radial incision healing quite nicely.  Small healing ridge which is resolving.  Normal radial pulse distal to the repair.  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  Will continue full activities with no limitation.  We will see him again on an as-needed basis   [name], MD [facility] Tel [phone] Pager [phone]

## 2018-12-19 ENCOUNTER — Other Ambulatory Visit: Payer: Self-pay

## 2018-12-19 ENCOUNTER — Ambulatory Visit (INDEPENDENT_AMBULATORY_CARE_PROVIDER_SITE_OTHER): Payer: Medicare HMO | Admitting: Physician Assistant

## 2018-12-19 ENCOUNTER — Encounter: Payer: Self-pay | Admitting: Physician Assistant

## 2018-12-19 VITALS — BP 133/74 | HR 90 | Ht 67.0 in | Wt 172.0 lb

## 2018-12-19 DIAGNOSIS — I10 Essential (primary) hypertension: Secondary | ICD-10-CM

## 2018-12-19 DIAGNOSIS — Z950 Presence of cardiac pacemaker: Secondary | ICD-10-CM

## 2018-12-19 DIAGNOSIS — I25810 Atherosclerosis of coronary artery bypass graft(s) without angina pectoris: Secondary | ICD-10-CM | POA: Diagnosis not present

## 2018-12-19 DIAGNOSIS — E1169 Type 2 diabetes mellitus with other specified complication: Secondary | ICD-10-CM

## 2018-12-19 DIAGNOSIS — I739 Peripheral vascular disease, unspecified: Secondary | ICD-10-CM

## 2018-12-19 DIAGNOSIS — G4733 Obstructive sleep apnea (adult) (pediatric): Secondary | ICD-10-CM | POA: Diagnosis not present

## 2018-12-19 DIAGNOSIS — Z9989 Dependence on other enabling machines and devices: Secondary | ICD-10-CM | POA: Diagnosis not present

## 2018-12-19 DIAGNOSIS — E119 Type 2 diabetes mellitus without complications: Secondary | ICD-10-CM | POA: Diagnosis not present

## 2018-12-19 DIAGNOSIS — E785 Hyperlipidemia, unspecified: Secondary | ICD-10-CM | POA: Diagnosis not present

## 2018-12-19 NOTE — Progress Notes (Signed)
Cardiology Office Note    Date:  12/20/2018   ID:  MAXWEL Burns, DOB [birth year], MRN [id number]  PCP:  [name], MD  Cardiologist:  Dr. Gwenlyn Found EP Dr. Sallyanne Kuster  Chief Complaint  Patient presents with  . Follow-up    seen for Dr. Gwenlyn Found.    History of Present Illness:  Robert Burns is a 78 y.o. male with PMH of CAD s/p CABG 2004, hypertension, hyperlipidemia, obstructive sleep apnea on CPAP, PAD, DM 2, and sick sinus syndrome s/p PPM in 2007 with medtronic generator change out in 2017.  He initially underwent bypass surgery in March 2004 with LIMA to LAD, SVG to diagonal, sequential SVG to ramus and OM as well as SVG to PDA.  He had previous left SFA and PTA stenting in 2004.  In 2012, he underwent resection of hemangioendothelioma by Dr. Kellie Simmering, a 6 mm Gore-Tex graft was placed.  Pathology was uncertain.  Since then, he continued to have neuropathic pain.  He had a abnormal stress test and I will underwent cardiac catheterization again he October 2016 that revealed high-grade proximal SVG to RCA disease, occluded SVG to diagonal.  He underwent stenting of SVG to RCA.  Due to recurrent chest pain, he was brought back to the Cath Lab in November 2016 and had a CTO PCI of SVG to diagonal.  He underwent left external iliac artery stenting in August 2017 and had a staged intervention of left SFA in September of the same year.  Patient was admitted to the hospital in October 2019 with unstable angina.  Cardiac catheterization revealed three-vessel disease, he underwent intervention on his diagonal and ramus branch vein grafts as well as intervention of distal LAD via LIMA insertion.  He was readmitted for unstable angina in August 2020, proximal diagonal branch vein graft was stented.  Post-cath, he developed left radial aneurysm.  He eventually underwent a repair of the left radial artery false aneurysm by Dr. Donnetta Hutching on 11/29/2018.  Patient presents today for cardiology office visit.   Initially after the stent placement, he did have some chest pain, but this has improved over time.  Last episode of chest pain was this morning which occurred at rest.  He denies any chest discomfort during exertion.  Since his chest discomfort is improving, I will hold off on increasing the Imdur.  He was just seen by Dr. Donnetta Hutching in the office 2 days ago after the recent surgery.  His wrist looks clean, dry, and that there is no significant pain.  He has a good pulse.  Overall he is doing well from cardiology perspective.  Last set of lab work obtained in January 2020 by his primary care provider showed well-controlled cholesterol, low HDL.  He is very carefully watching his diet.   Past Medical History:  Diagnosis Date  . Arthritis    "all over" (11/25/2015)  . CAD (coronary artery disease)    a. s/p CABG  06/2002; b. 02/01/15 PCI: DES to prox SVG to PDA, staged PCI of SVG to Diag in 02/2015; c. 04/2017 Cath/PCI: LM nl, LAD 100ost, 50m, 75d, LCX 60ost, OM2 80, RCA 100ost, RPDA 80, LIMA->LAD ok, VG->D1 patent stent, VG->RPDA patent stent, 50p, VG->OM1->OM2 90p (3.0x24 Synergy DES), 100 between OM1->OM2 (med rx).  . Cancer (Robert Burns)    Right Shoulder, Left Leg- BCC, SCC, AND MELANOMA  . Epithelioid hemangioendothelioma    s/p resection of left SFA/mass with interposition 6 mm GoreTex graft 06/02/10, s/p repeat resection for positive  margins 09/22/10  . Hyperlipidemia   . Hypertension   . Leg pain   . OSA on CPAP   . PAD (peripheral artery disease) ([place])    a. 10/2002 L SFA PTA/BMS; b. 8/17 LE Angio: LEIA 90 (9x40 self exp stent), LSFA short segment prox occlusion (staged PTA/stenting 01/03/2016), patent mid stent, RSFA 73m (staged PTA/DEB 02/14/2016).  . Presence of permanent cardiac pacemaker    Medtronic  . SSS (sick sinus syndrome) ([place])    a. s/p PPM in 2007 with gen change 04/2015 - Medtronic Adapta ADDRL1, ser # [id number].  . Type II diabetes mellitus (Robert Burns)    Type II  . Urgency of urination      Past Surgical History:  Procedure Laterality Date  . CARDIAC CATHETERIZATION N/A 02/01/2015   Procedure: Left Heart Cath and Coronary Angiography;  Surgeon: Lorretta Harp, MD;  Location: [place] [facility];  Service: Cardiovascular;  Laterality: N/A;  . CARDIAC CATHETERIZATION N/A 02/01/2015   Procedure: Coronary Stent Intervention;  Surgeon: Lorretta Harp, MD;  Location: [place] [facility];  Service: Cardiovascular;  Laterality: N/A;  . CARDIAC CATHETERIZATION  06/2002   "just before bypass OR"  . CARDIAC CATHETERIZATION N/A 03/01/2015   Procedure: Coronary Stent Intervention;  Surgeon: Lorretta Harp, MD;  Location: [place] [facility];  Service: Cardiovascular;  Laterality: N/A;  . CARDIAC CATHETERIZATION  02/06/2018  . COLONOSCOPY    . CORONARY ANGIOPLASTY    . CORONARY ARTERY BYPASS GRAFT  06/2002   x5, LIMA-LAD;VG- Diag; seq VG- ramus & OM branch; VG-PDA  . CORONARY STENT INTERVENTION N/A 04/12/2017   Procedure: CORONARY STENT INTERVENTION;  Surgeon: Lorretta Harp, MD;  Location: [place] [facility];  Service: Cardiovascular;  Laterality: N/A;  . CORONARY STENT INTERVENTION N/A 02/08/2018   Procedure: CORONARY STENT INTERVENTION;  Surgeon: Jettie Booze, MD;  Location: [place] [facility];  Service: Cardiovascular;  Laterality: N/A;  . CORONARY STENT INTERVENTION N/A 11/12/2018   Procedure: CORONARY STENT INTERVENTION;  Surgeon: Wellington Hampshire, MD;  Location: [place] [facility];  Service: Cardiovascular;  Laterality: N/A;  . EP IMPLANTABLE DEVICE N/A 04/23/2015   Procedure: PPM Generator Changeout;  Surgeon: Sanda Klein, MD;  Location: [place] [facility];  Service: Cardiovascular;  Laterality: N/A;  . FALSE ANEURYSM REPAIR Left 11/29/2018   Procedure: REPAIR FALSE ANEURYSM LEFT RADIAL ARTERY;  Surgeon: [name], MD;  Location: [place];  Service: Vascular;  Laterality: Left;  . FEMORAL ARTERY STENT Left ~ 2014   "taken out of my leg; couldn' catorgorize what kind so  the put it under all 3"; cataroziepitheloid hemanioendotheliomau  . FOOT FRACTURE SURGERY Left 1973  . FRACTURE SURGERY    . INSERT / REPLACE / REMOVE PACEMAKER  10/13/05   right side, medtronic Adapta  . KNEE HARDWARE REMOVAL Right 1950's   "3-4 months after the insertion"  . KNEE SURGERY Right 1950's   "broke my lower leg; had to put pin in my knee to keep lower leg in place til it healed"  . LEFT HEART CATH AND CORS/GRAFTS ANGIOGRAPHY N/A 04/12/2017   Procedure: LEFT HEART CATH AND CORS/GRAFTS ANGIOGRAPHY;  Surgeon: Lorretta Harp, MD;  Location: [place] [facility];  Service: Cardiovascular;  Laterality: N/A;  . LEFT HEART CATH AND CORS/GRAFTS ANGIOGRAPHY N/A 02/06/2018   Procedure: LEFT HEART CATH AND CORS/GRAFTS ANGIOGRAPHY;  Surgeon: Troy Sine, MD;  Location: [place] [facility];  Service: Cardiovascular;  Laterality: N/A;  .  LEFT HEART CATH AND CORS/GRAFTS ANGIOGRAPHY N/A 11/12/2018   Procedure: LEFT HEART CATH AND CORS/GRAFTS ANGIOGRAPHY;  Surgeon: Wellington Hampshire, MD;  Location: [place] [facility];  Service: Cardiovascular;  Laterality: N/A;  . PERIPHERAL VASCULAR CATHETERIZATION N/A 11/25/2015   Procedure: Lower Extremity Angiography;  Surgeon: Lorretta Harp, MD;  Location: [place] [facility];  Service: Cardiovascular;  Laterality: N/A;  . PERIPHERAL VASCULAR CATHETERIZATION Left 11/25/2015   Procedure: Peripheral Vascular Intervention;  Surgeon: Lorretta Harp, MD;  Location: [place] [facility];  Service: Cardiovascular;  Laterality: Left;  external iliac  . PERIPHERAL VASCULAR CATHETERIZATION N/A 01/03/2016   Procedure: Lower Extremity Angiography;  Surgeon: Lorretta Harp, MD;  Location: [place] [facility];  Service: Cardiovascular;  Laterality: N/A;  . PERIPHERAL VASCULAR CATHETERIZATION Left 01/03/2016   Procedure: Peripheral Vascular Intervention;  Surgeon: Lorretta Harp, MD;  Location: [place] [facility];  Service: Cardiovascular;  Laterality: Left;  SFA  .  PERIPHERAL VASCULAR CATHETERIZATION Right 02/14/2016   Procedure: Peripheral Vascular Atherectomy;  Surgeon: Lorretta Harp, MD;  Location: [place] [facility];  Service: Cardiovascular;  Laterality: Right;  SFA  . POPLITEAL ARTERY STENT  01/03/2016   Contralateral access with a 7 French crossover sheath (second order catheter placement)  . TONSILLECTOMY AND ADENOIDECTOMY    . TUMOR EXCISION Right ~ 2005   cancerous tumor removed from shoulder  . TUMOR EXCISION Right ~ 2000   benign tumor removed from under shoulder  . TUMOR EXCISION Left 06/02/2010   resection of Lt SFA wth interposition of Gore-Tex graft    Current Medications: Outpatient Medications Prior to Visit  Medication Sig Dispense Refill  . amLODipine (NORVASC) 10 MG tablet Take 10 mg by mouth daily.     [name] aspirin 81 MG chewable tablet Chew 1 tablet (81 mg total) by mouth daily.    [name] aspirin EC 81 MG tablet Take 81 mg by mouth daily.    [name] atorvastatin (LIPITOR) 80 MG tablet Take 1 tablet (80 mg total) by mouth daily at 6 PM. 90 tablet 1  . Cholecalciferol (VITAMIN D3) 2000 units capsule Take 2,000 Units by mouth 2 (two) times daily.    . clopidogrel (PLAVIX) 75 MG tablet Take 1 tablet (75 mg total) by mouth daily with breakfast. (Patient taking differently: Take 75 mg by mouth every morning. ) 90 tablet 3  . glipiZIDE (GLUCOTROL) 5 MG tablet Take 5 mg by mouth daily with breakfast.     . hydrALAZINE (APRESOLINE) 25 MG tablet Take 25 mg by mouth 2 (two) times daily.     . hydrochlorothiazide (HYDRODIURIL) 25 MG tablet Take 25 mg by mouth every morning.    [name] HYDROcodone-acetaminophen (NORCO) 5-325 MG tablet Take 1 tablet by mouth every 4 (four) hours as needed for moderate pain. 10 tablet 0  . isosorbide mononitrate (IMDUR) 60 MG 24 hr tablet Take 1 tablet (60 mg total) by mouth daily. 90 tablet 3  . metFORMIN (GLUCOPHAGE) 1000 MG tablet Take 1 tablet (1,000 mg total) by mouth 2 (two) times daily with a meal.    . metoprolol  tartrate (LOPRESSOR) 100 MG tablet Take 1 tablet (100 mg total) by mouth 2 (two) times daily. 180 tablet 3  . nitroGLYCERIN (NITROSTAT) 0.4 MG SL tablet Place 1 tablet (0.4 mg total) under the tongue every 5 (five) minutes as needed for chest pain. 25 tablet 3  . potassium chloride SA (K-DUR,KLOR-CON) 20 MEQ tablet Take 1 tablet (20 mEq total)  by mouth daily. 15 tablet 0  . tamsulosin (FLOMAX) 0.4 MG CAPS capsule Take 0.4 mg by mouth 2 (two) times daily.      No facility-administered medications prior to visit.      Allergies:   Peanut-containing drug products, Ace inhibitors, Diltiazem hcl, Meloxicam, Carvedilol, Clonidine hcl, and Duloxetine hcl   Social History   Socioeconomic History  . Marital status: Married    Spouse name: Not on file  . Number of children: Not on file  . Years of education: Not on file  . Highest education level: Not on file  Occupational History  . Not on file  Social Needs  . Financial resource strain: Not on file  . Food insecurity    Worry: Not on file    Inability: Not on file  . Transportation needs    Medical: Not on file    Non-medical: Not on file  Tobacco Use  . Smoking status: Former Smoker    Years: 10.00    Types: Pipe    Quit date: 1973    Years since quitting: 47.7  . Smokeless tobacco: Never Used  . Tobacco comment: "quit smoking in 1973"  Substance and Sexual Activity  . Alcohol use: No  . Drug use: No  . Sexual activity: Not Currently  Lifestyle  . Physical activity    Days per week: Not on file    Minutes per session: Not on file  . Stress: Not on file  Relationships  . Social Herbalist on phone: Not on file    Gets together: Not on file    Attends religious service: Not on file    Active member of club or organization: Not on file    Attends meetings of clubs or organizations: Not on file    Relationship status: Not on file  Other Topics Concern  . Not on file  Social History Narrative  . Not on file      Family History:  The patient's family history includes Coronary artery disease in his mother; Diabetes in his father and sister; Heart attack in his father and mother; Heart disease in his father, mother, and sister; Hyperlipidemia in his father; Hypertension in his mother and sister.   ROS:   Please see the history of present illness.    ROS All other systems reviewed and are negative.   PHYSICAL EXAM:   VS:  BP 133/74   Pulse 90   Ht 5\' 7"  (1.702 m)   Wt 172 lb (78 kg)   SpO2 97%   BMI 26.94 kg/m    GEN: Well nourished, well developed, in no acute distress  HEENT: normal  Neck: no JVD, carotid bruits, or masses Cardiac: RRR; no murmurs, rubs, or gallops,no edema  Respiratory:  clear to auscultation bilaterally, normal work of breathing GI: soft, nontender, nondistended, + BS MS: no deformity or atrophy  Skin: warm and dry, no rash Neuro:  Alert and Oriented x 3, Strength and sensation are intact Psych: euthymic mood, full affect  Wt Readings from Last 3 Encounters:  12/19/18 172 lb (78 kg)  12/17/18 171 lb 14.4 oz (78 kg)  11/29/18 165 lb (74.8 kg)      Studies/Labs Reviewed:   EKG:  EKG is not ordered today.    Recent Labs: 11/11/2018: ALT 20 11/29/2018: BUN 22; Creatinine, Ser 0.96; Hemoglobin 12.6; Platelets 171; Potassium 3.9; Sodium 140   Lipid Panel No results found for: CHOL, TRIG, HDL, CHOLHDL, VLDL,  [place], LDLDIRECT  Additional studies/ records that were reviewed today include:   Cath 11/12/2018  Mid LM lesion is 50% stenosed.  Ost LAD to Prox LAD lesion is 100% stenosed.  Ost Cx lesion is 40% stenosed.  Prox Cx lesion is 30% stenosed.  Ost 3rd Mrg to 3rd Mrg lesion is 99% stenosed.  Ost RCA to Prox RCA lesion is 100% stenosed.  Ost Ramus lesion is 95% stenosed.  Dist Graft lesion is 50% stenosed.  Prox Graft lesion is 95% stenosed.  Origin to Mid Graft lesion before Prox LAD is 100% stenosed.  Prox Graft to Dist Graft lesion between Ramus  and 2nd Mrg is 100% stenosed.  Mid Graft to Dist Graft lesion is 90% stenosed.  Post intervention, there is a 0% residual stenosis.  A drug-eluting stent was successfully placed using a STENT RESOLUTE [id number] 3.0X30.  The left ventricular systolic function is normal.  LV end diastolic pressure is normal.  The left ventricular ejection fraction is 55-65% by visual estimate.  Mid Cx lesion is 90% stenosed.  Origin to Prox Graft lesion is 60% stenosed.  Post intervention, there is a 0% residual stenosis.  A drug-eluting stent was successfully placed using a STENT RESOLUTE [id number].  Mid LAD-2 lesion is 20% stenosed.  Non-stenotic Mid LAD-1 lesion.  Dist LAD lesion is 70% stenosed.   1.  Severe underlying three-vessel coronary artery disease with patent LIMA to LAD, patent SVG to diagonal with severe proximal in-stent restenosis, patent SVG to RCA with moderate in-stent restenosis proximally and severe new stenosis in the mid to distal segment, and occluded SVG to ramus.  Significant in-stent restenosis in the distal LAD stent which was placed last year.  2.  Normal LV systolic function and left ventricular end-diastolic pressure. 3.  Successful PCI and drug-eluting stent placement to SVG to diagonal and SVG to RCA.  Recommendations: Recommend medical therapy for the rest of his CAD including the distal LAD stent. See recommendations below.    ASSESSMENT:    1. Coronary artery disease involving coronary bypass graft of native heart without angina pectoris   2. Essential hypertension   3. Hyperlipidemia due to type 2 diabetes mellitus ([place])   4. OSA on CPAP   5. PAD (peripheral artery disease) ([place])   6. Controlled type 2 diabetes mellitus without complication, without long-term current use of insulin ([place])   7. Pacemaker      PLAN:  In order of problems listed above:  1. CAD s/p CABG: Recently underwent intervention to SVG to diagonal and SVG to RCA.  Initially  after cardiac catheterization he did have some chest pain, this has improved in the past few days.  Continue aspirin and Plavix.  Procedure was complicated by pseudoaneurysm, this has been fixed by vascular surgery  2. Hypertension: Blood pressure stable on current therapy  3. Hyperlipidemia: Continue Lipitor 80 mg daily  4. DM2: Managed by primary care provider  5. Obstructive sleep apnea: Continue CPAP therapy  6. History of pacemaker: Followed by Dr. Sallyanne Kuster  7. PAD: History of resection of hemangioendothelioma by Dr. Kellie Simmering in 2012.    Medication Adjustments/Labs and Tests Ordered: Current medicines are reviewed at length with the patient today.  Concerns regarding medicines are outlined above.  Medication changes, Labs and Tests ordered today are listed in the Patient Instructions below. Patient Instructions  Medication Instructions:  Your physician recommends that you continue on your current medications as directed. Please refer to the Current Medication list given to  you today.   If you need a refill on your cardiac medications before your next appointment, please call your pharmacy.   Lab work: NONE ordered at this time of appointment   If you have labs (blood work) drawn today and your tests are completely normal, you will receive your results only by: [name] MyChart Message (if you have MyChart) OR . A paper copy in the mail If you have any lab test that is abnormal or we need to change your treatment, we will call you to review the results.  Testing/Procedures: NONE ordered at this time of appointment   Follow-Up: At [facility], you and your health needs are our priority.  As part of our continuing mission to provide you with exceptional heart care, we have created designated Provider Care Teams.  These Care Teams include your primary Cardiologist (physician) and Advanced Practice Providers (APPs -  Physician Assistants and Nurse Practitioners) who all work together to  provide you with the care you need, when you need it. . Your physician recommends that you follow-up appointment as scheduled with Dr. Gwenlyn Found  Any Other Special Instructions Will Be Listed Below (If Applicable).       Hilbert Corrigan, [place]  12/20/2018 11:28 PM    [address], [place], [place]  [address] Phone: [phone]; Fax: [phone]

## 2018-12-19 NOTE — Patient Instructions (Signed)
Medication Instructions:  Your physician recommends that you continue on your current medications as directed. Please refer to the Current Medication list given to you today.   If you need a refill on your cardiac medications before your next appointment, please call your pharmacy.   Lab work: NONE ordered at this time of appointment   If you have labs (blood work) drawn today and your tests are completely normal, you will receive your results only by: [name] MyChart Message (if you have MyChart) OR . A paper copy in the mail If you have any lab test that is abnormal or we need to change your treatment, we will call you to review the results.  Testing/Procedures: NONE ordered at this time of appointment   Follow-Up: At [facility], you and your health needs are our priority.  As part of our continuing mission to provide you with exceptional heart care, we have created designated Provider Care Teams.  These Care Teams include your primary Cardiologist (physician) and Advanced Practice Providers (APPs -  Physician Assistants and Nurse Practitioners) who all work together to provide you with the care you need, when you need it. . Your physician recommends that you follow-up appointment as scheduled with Dr. Gwenlyn Found  Any Other Special Instructions Will Be Listed Below (If Applicable).

## 2018-12-20 ENCOUNTER — Encounter: Payer: Self-pay | Admitting: Physician Assistant

## 2018-12-31 ENCOUNTER — Telehealth (HOSPITAL_COMMUNITY): Payer: Self-pay | Admitting: *Deleted

## 2018-12-31 ENCOUNTER — Telehealth (HOSPITAL_COMMUNITY): Payer: Self-pay

## 2018-12-31 DIAGNOSIS — H35372 Puckering of macula, left eye: Secondary | ICD-10-CM | POA: Diagnosis not present

## 2018-12-31 DIAGNOSIS — H35351 Cystoid macular degeneration, right eye: Secondary | ICD-10-CM | POA: Diagnosis not present

## 2018-12-31 DIAGNOSIS — H34811 Central retinal vein occlusion, right eye, with macular edema: Secondary | ICD-10-CM | POA: Diagnosis not present

## 2018-12-31 NOTE — Telephone Encounter (Signed)
-----   Message from [name], MD sent at 12/31/2018 12:48 PM EDT ----- Regarding: RE: Any limitations for use of arm at Cardiac Rehab Mr. Robert Burns has no limitations from the standpoint of his left radial artery pseudoaneurysm repair. Curt Jews ----- Message ----- From: Rowe Pavy, RN Sent: 12/20/2018   9:48 AM EDT To: [name], MD Subject: Any limitations for use of arm at Cardiac Re#  Dr. Sherren Mocha,  The above pt is eligible to participate in Cardiac rehab. On 8/19 pt had Left radial  pseudoaneurysm repair.  Pt completed his follow up with you on 9/8.  Noted that he may resume all activities with no limitation. Would like to verify that this would include exercise at cardiac rehab which could be vigorous at times. Any limitations for use of the arm during exercise?  Thanks Psychologist, clinical, BSN Cardiac and Training and development officer

## 2018-12-31 NOTE — Telephone Encounter (Signed)
Called patient to see if he is interested in the Cardiac Rehab Program. Patient stated not at this time, he is due to go have his legs "looked at". Stated he will contact us when he is ready to schedule.  Closed referral

## 2019-01-07 ENCOUNTER — Encounter: Payer: Medicare HMO | Admitting: *Deleted

## 2019-01-10 DIAGNOSIS — E7800 Pure hypercholesterolemia, unspecified: Secondary | ICD-10-CM | POA: Diagnosis not present

## 2019-01-10 DIAGNOSIS — E1142 Type 2 diabetes mellitus with diabetic polyneuropathy: Secondary | ICD-10-CM | POA: Diagnosis not present

## 2019-01-10 DIAGNOSIS — I25709 Atherosclerosis of coronary artery bypass graft(s), unspecified, with unspecified angina pectoris: Secondary | ICD-10-CM | POA: Diagnosis not present

## 2019-01-10 DIAGNOSIS — E1151 Type 2 diabetes mellitus with diabetic peripheral angiopathy without gangrene: Secondary | ICD-10-CM | POA: Diagnosis not present

## 2019-01-10 DIAGNOSIS — N401 Enlarged prostate with lower urinary tract symptoms: Secondary | ICD-10-CM | POA: Diagnosis not present

## 2019-01-10 DIAGNOSIS — M170 Bilateral primary osteoarthritis of knee: Secondary | ICD-10-CM | POA: Diagnosis not present

## 2019-01-10 DIAGNOSIS — M160 Bilateral primary osteoarthritis of hip: Secondary | ICD-10-CM | POA: Diagnosis not present

## 2019-01-10 DIAGNOSIS — I10 Essential (primary) hypertension: Secondary | ICD-10-CM | POA: Diagnosis not present

## 2019-01-17 ENCOUNTER — Encounter: Payer: Self-pay | Admitting: Cardiology

## 2019-01-28 ENCOUNTER — Ambulatory Visit (INDEPENDENT_AMBULATORY_CARE_PROVIDER_SITE_OTHER): Payer: Medicare HMO | Admitting: *Deleted

## 2019-01-28 DIAGNOSIS — I480 Paroxysmal atrial fibrillation: Secondary | ICD-10-CM | POA: Diagnosis not present

## 2019-01-28 DIAGNOSIS — I495 Sick sinus syndrome: Secondary | ICD-10-CM

## 2019-01-29 LAB — CUP PACEART REMOTE DEVICE CHECK
Battery Impedance: 234 Ohm
Battery Remaining Longevity: 119 mo
Battery Voltage: 2.79 V
Brady Statistic AP VP Percent: 1 %
Brady Statistic AP VS Percent: 99 %
Brady Statistic AS VP Percent: 0 %
Brady Statistic AS VS Percent: 0 %
Date Time Interrogation Session: 20201019213515
Implantable Lead Implant Date: 20070706
Implantable Lead Implant Date: 20070706
Implantable Lead Location: 753859
Implantable Lead Location: 753860
Implantable Lead Model: 5076
Implantable Lead Model: 5092
Implantable Pulse Generator Implant Date: 20170113
Lead Channel Impedance Value: 424 Ohm
Lead Channel Impedance Value: 686 Ohm
Lead Channel Pacing Threshold Amplitude: 1 V
Lead Channel Pacing Threshold Amplitude: 1.5 V
Lead Channel Pacing Threshold Pulse Width: 0.4 ms
Lead Channel Pacing Threshold Pulse Width: 0.4 ms
Lead Channel Setting Pacing Amplitude: 2 V
Lead Channel Setting Pacing Amplitude: 3 V
Lead Channel Setting Pacing Pulse Width: 0.4 ms
Lead Channel Setting Sensing Sensitivity: 2 mV

## 2019-02-12 ENCOUNTER — Ambulatory Visit (HOSPITAL_BASED_OUTPATIENT_CLINIC_OR_DEPARTMENT_OTHER)
Admission: RE | Admit: 2019-02-12 | Discharge: 2019-02-12 | Disposition: A | Discharge status: Home / Self Care | Payer: Medicare HMO | Source: Ambulatory Visit | Attending: Cardiovascular Disease | Admitting: Cardiovascular Disease

## 2019-02-12 ENCOUNTER — Other Ambulatory Visit: Payer: Self-pay

## 2019-02-12 ENCOUNTER — Ambulatory Visit (HOSPITAL_COMMUNITY)
Admission: RE | Admit: 2019-02-12 | Discharge: 2019-02-12 | Disposition: A | Discharge status: Home / Self Care | Payer: Medicare HMO | Source: Ambulatory Visit | Attending: Cardiology | Admitting: Cardiology

## 2019-02-12 ENCOUNTER — Other Ambulatory Visit (HOSPITAL_COMMUNITY): Payer: Self-pay | Admitting: Cardiovascular Disease

## 2019-02-12 DIAGNOSIS — G4733 Obstructive sleep apnea (adult) (pediatric): Secondary | ICD-10-CM | POA: Diagnosis not present

## 2019-02-12 DIAGNOSIS — Z95828 Presence of other vascular implants and grafts: Secondary | ICD-10-CM

## 2019-02-12 DIAGNOSIS — I739 Peripheral vascular disease, unspecified: Secondary | ICD-10-CM

## 2019-02-12 DIAGNOSIS — Z9862 Peripheral vascular angioplasty status: Secondary | ICD-10-CM

## 2019-02-13 NOTE — Progress Notes (Signed)
Remote pacemaker transmission.   

## 2019-02-14 ENCOUNTER — Other Ambulatory Visit: Payer: Self-pay | Admitting: *Deleted

## 2019-02-14 DIAGNOSIS — I739 Peripheral vascular disease, unspecified: Secondary | ICD-10-CM

## 2019-02-21 ENCOUNTER — Encounter: Payer: Self-pay | Admitting: Cardiovascular Disease

## 2019-02-21 ENCOUNTER — Other Ambulatory Visit: Payer: Self-pay

## 2019-02-21 ENCOUNTER — Ambulatory Visit (INDEPENDENT_AMBULATORY_CARE_PROVIDER_SITE_OTHER): Payer: Medicare HMO | Admitting: Cardiovascular Disease

## 2019-02-21 VITALS — BP 124/70 | HR 91 | Temp 97.2°F | Ht 67.0 in | Wt 176.0 lb

## 2019-02-21 DIAGNOSIS — I25810 Atherosclerosis of coronary artery bypass graft(s) without angina pectoris: Secondary | ICD-10-CM | POA: Diagnosis not present

## 2019-02-21 DIAGNOSIS — G4733 Obstructive sleep apnea (adult) (pediatric): Secondary | ICD-10-CM | POA: Diagnosis not present

## 2019-02-21 DIAGNOSIS — Z9989 Dependence on other enabling machines and devices: Secondary | ICD-10-CM

## 2019-02-21 DIAGNOSIS — E1169 Type 2 diabetes mellitus with other specified complication: Secondary | ICD-10-CM

## 2019-02-21 DIAGNOSIS — E785 Hyperlipidemia, unspecified: Secondary | ICD-10-CM

## 2019-02-21 DIAGNOSIS — I739 Peripheral vascular disease, unspecified: Secondary | ICD-10-CM

## 2019-02-21 DIAGNOSIS — I480 Paroxysmal atrial fibrillation: Secondary | ICD-10-CM | POA: Diagnosis not present

## 2019-02-21 DIAGNOSIS — R0989 Other specified symptoms and signs involving the circulatory and respiratory systems: Secondary | ICD-10-CM

## 2019-02-21 DIAGNOSIS — I495 Sick sinus syndrome: Secondary | ICD-10-CM | POA: Diagnosis not present

## 2019-02-21 NOTE — Progress Notes (Signed)
02/21/2019 Robert Burns   [birth year]  [id number]  Primary Physician Robert Orn, MD Primary Cardiologist: Robert Harp MD Robert Burns, [name]  HPI:  Robert Burns is a 78 y.o.  married Caucasian male, father of 2, grandfather to 3 grandchildren whose wife Robert Burns who is also a patient of mine. I last saw him in [facility]  11/20/2018.[name] He has a history of CAD status post coronary artery bypass grafting March 2004 with a LIMA to his LAD, a vein to a diagonal branch, a sequential vein to a ramus and OM branch, as well as a vein to the PDA. Last functional study performed July 28, 2011, was entirely normal. 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 He had a pacemaker placed for sick sinus syndrome November 2008 followed by Robert Burns. He has obstructive sleep apnea on CPAP. He complain of left thigh pain and I angiogram'd him revealing patent arteries though he did have a space-occupying lesion which was removed by Dr. Kellie Burns with placement of an interposition 6-mm Gore-Tex graft. The pathology was uncertain. 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 Robert Burns follows his lipid profile.Since I saw him back 11/07/12 he has done well. Followup Dopplers performed in our office 09/27/12 revealed a high-grade lesion in the distal right SFA with an ABI of 0.3. His left ABI 1.1 without obstructive disease. His most recent lower extremity Doppler Dopplers performed 9/32/16 revealed a right ABI 0.82 And a left ABI of 0.89. Over the last 3 months he's noticed anginal chest pain occurring both at rest and with exertion with left upper extremity radiation. He also complains of left lower extremity discomfort. to moderate anterolateral ischemia. Because of ongoing chest pain and a Myoview that showed anterolateral ischemia and he underwent cardiac catheterization on 02/01/15 revealing high grade segmental proximal right SVG disease and subtotally occluded  diagonal branch SVG. He underwent stenting of his RCA SVG successfully. He does have continued chest pain although somewhat improved since his last procedure. I brought him back for staged diagonal branch SVG intervention on 03/01/15. This was successful and since that time he denies chest pain or shortness of breath. He underwent a generator change by Robert Burns in January for end-of-life of his Medtronic pacemaker.Since I saw him 6 months ago he developed new left calf claudication of the last 3 months. Lower extremity arterial Doppler studies performed today revealed a decline in his left ABI from 0.87 down to 0.59 with an occluded left SFA that is a new finding. 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 He had a short segment occlusion of the proximal left SFA with patent mid left SFA stent. He had 90% segmental calcified mid right SFA stenosis with three-vessel runoff bilaterally. I ended up stenting his left external iliac artery with a 9 mm x 40 mm long nitinol self-expanding stent which improved his symptoms somewhat although he continuedto have lifestyle limiting claudication. He underwent staged intervention of his left SFA on 01/03/16. I stented the entire quadrant totally occluded segment with a 6 mm x 250 mm long Viabahn Stent. His claudication on the left has resolved and his ABIs normalized. He now complains of right leg medication. I did demonstrate a 90% calcified segmental mid right SFA stenosis at the time of angiography 11/25/15. He underwent diamondback orbital rotation atherectomy/drug-eluting  angioplasty of his mid calcified right SFA by myself 02/14/16. This follow-up Dopplers performed 02/24/16 revealed normal ABIs bilaterally. His claudication has improved. When I saw him in the office possibly 3 weeks ago he was complaining of fairly  new onset substernal chest pain over the prior several months occurring several times a week. These were new since his RCA and diagonal branch vein graft interventions at the end of 2016. He is on good antianginal medications. A Myoview stress test was ordered that showed subtle inferolateral ischemia and echo showed normal LV function.[name]  He was admitted to the hospital 02/06/2018 for 3 days with unstable angina. He underwent catheterization by Dr. Claiborne Burns on 02/06/2018 revealing disease in all 3 vein grafts as well as in the LAD beyond LIMA insertion, 2 days later he underwent complex intervention on his diagonal and ramus branch vein grafts with restenting as well as intervention on his LAD via LIMA insertion. His RCA vein graft was not intervened on. He currently denies angina or claudication.   He was admitted to the hospital with unstable angina on 11/11/2018.  Enzymes were negative.  He underwent catheterization and intervention by Dr. Fletcher Burns 11/12/2018 via the left radial approach with stenting of his proximal diagonal branch vein graft in his distal PDA vein graft.  Since discharge, his anginal symptoms have significantly improved although he still has a "heaviness feeling" in his chest.  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  This was subsequently surgically addressed by Dr. Donnetta Burns.  Since I saw him in the office 3 months ago he continues to do well.  He is somewhat unsteady on his feet.  He lacks energy but denies chest pain or shortness of breath.  Does have some claudication symptoms with Dopplers performed earlier this month that showed a new high-frequency signal in his mid right SFA.    Current Meds  Medication Sig   amLODipine (NORVASC) 10 MG tablet Take 10 mg by mouth daily.    aspirin 81 MG chewable tablet Chew 1 tablet (81 mg total) by mouth daily.   aspirin EC 81 MG tablet Take 81 mg by mouth daily.   atorvastatin (LIPITOR) 80 MG tablet Take 1  tablet (80 mg total) by mouth daily at 6 PM.   Cholecalciferol (VITAMIN D3) 2000 units capsule Take 2,000 Units by mouth 2 (two) times daily.   clopidogrel (PLAVIX) 75 MG tablet Take 1 tablet (75 mg total) by mouth daily with breakfast. (Patient taking differently: Take 75 mg by mouth every morning. )   glipiZIDE (GLUCOTROL) 5 MG tablet Take 5 mg by mouth daily with breakfast.    hydrALAZINE (APRESOLINE) 25 MG tablet Take 25 mg by mouth 2 (two) times daily.    hydrochlorothiazide (HYDRODIURIL) 25 MG tablet Take 25 mg by mouth every morning.   HYDROcodone-acetaminophen (NORCO) 5-325 MG tablet Take 1 tablet by mouth every 4 (four) hours as needed for moderate pain.   isosorbide mononitrate (IMDUR) 60 MG 24 hr tablet Take 1 tablet (60 mg total) by mouth daily.   metFORMIN (GLUCOPHAGE) 1000 MG tablet Take 1 tablet (1,000 mg total) by mouth 2 (two) times daily with a meal.   metoprolol tartrate (LOPRESSOR) 100 MG tablet Take 1 tablet (100 mg total) by mouth 2 (two) times daily.   nitroGLYCERIN (NITROSTAT) 0.4 MG SL tablet Place 1 tablet (0.4 mg total) under the tongue every 5 (five) minutes as needed for chest pain.   potassium chloride SA (  K-DUR,KLOR-CON) 20 MEQ tablet Take 1 tablet (20 mEq total) by mouth daily.   tamsulosin (FLOMAX) 0.4 MG CAPS capsule Take 0.4 mg by mouth 2 (two) times daily.      Allergies  Allergen Reactions   Peanut-Containing Drug Products Anaphylaxis and Other (See Comments)    Tongue swelling is severe    Ace Inhibitors Hives, Rash and Cough        Diltiazem Hcl Other (See Comments)    UNSPECIFIED REACTION     Meloxicam Other (See Comments)    GI upset GI BLEED    Carvedilol Itching   Clonidine Hcl Other (See Comments)    Patch only - skin irritation   Duloxetine Hcl Other (See Comments)    Urinary frequency    Social History   Socioeconomic History   Marital status: Married    Spouse name: Not on file   Number of children: Not on  file   Years of education: Not on file   Highest education level: Not on file  Occupational History   Not on file  Social Needs   Financial resource strain: Not on file   Food insecurity    Worry: Not on file    Inability: Not on file   Transportation needs    Medical: Not on file    Non-medical: Not on file  Tobacco Use   Smoking status: Former Smoker    Years: 10.00    Types: Pipe    Quit date: 54    Years since quitting: 47.8   Smokeless tobacco: Never Used   Tobacco comment: "quit smoking in 1973"  Substance and Sexual Activity   Alcohol use: No   Drug use: No   Sexual activity: Not Currently  Lifestyle   Physical activity    Days per week: Not on file    Minutes per session: Not on file   Stress: Not on file  Relationships   Social connections    Talks on phone: Not on file    Gets together: Not on file    Attends religious service: Not on file    Active member of club or organization: Not on file    Attends meetings of clubs or organizations: Not on file    Relationship status: Not on file   Intimate partner violence    Fear of current or ex partner: Not on file    Emotionally abused: Not on file    Physically abused: Not on file    Forced sexual activity: Not on file  Other Topics Concern   Not on file  Social History Narrative   Not on file     Review of Systems: General: negative for chills, fever, night sweats or weight changes.  Cardiovascular: negative for chest pain, dyspnea on exertion, edema, orthopnea, palpitations, paroxysmal nocturnal dyspnea or shortness of breath Dermatological: negative for rash Respiratory: negative for cough or wheezing Urologic: negative for hematuria Abdominal: negative for nausea, vomiting, diarrhea, bright red blood per rectum, melena, or hematemesis Neurologic: negative for visual changes, syncope, or dizziness All other systems reviewed and are otherwise negative except as noted  above.    Blood pressure 124/70, pulse 91, temperature (!) 97.2 F (36.2 C), height 5\' 7"  (1.702 m), weight 176 lb (79.8 kg), SpO2 97 %.  General appearance: alert and no distress Neck: no adenopathy, no JVD, supple, symmetrical, trachea midline, thyroid not enlarged, symmetric, no tenderness/mass/nodules and Soft left carotid bruit Lungs: clear to auscultation bilaterally Heart: regular rate and  rhythm, S1, S2 normal, no murmur, click, rub or gallop Extremities: extremities normal, atraumatic, no cyanosis or edema Pulses: 2+ and symmetric Skin: Skin color, texture, turgor normal. No rashes or lesions Neurologic: Alert and oriented X 3, normal strength and tone. Normal symmetric reflexes. Normal coordination and gait  EKG not performed today  ASSESSMENT AND PLAN:   Essential hypertension History of essential hypertension with blood pressure measured today 124/70.  He is on amlodipine, hydralazine, HydroDIURIL and metoprolol.  SSS (sick sinus syndrome), medtronic adapta History of sick sinus syndrome status post permanent transvenous pacemaker inserted 10/13/2005 followed by Robert Burns  Hyperlipidemia due to type 2 diabetes mellitus ([place]) History of hyperlipidemia on statin therapy with lipid profile performed 04/16/2018 revealing total cholesterol of 78, LDL 33 and HDL 31  OSA on CPAP History of obstructive sleep apnea on CPAP  Peripheral arterial disease (HCC) Extensive history of peripheral arterial disease status post bilateral SFA intervention dating back 10/30/2002 by myself on the left side.  He had a Gore-Tex graft on the left by Dr. Kellie Burns, stenting to his left external iliac artery he underwent diamondback orbital rotational atherectomy of his right SFA 02/14/2016 with marked improvement.  Dopplers performed the following year showed the stent to be widely patent however his most recent Dopplers performed earlier this month showed a new high-frequency signal in his mid right SFA.   He does complain of some claudication.  CAD (coronary artery disease) of bypass graft History of CAD status post remote coronary artery bypass grafting March 2004.  He has had multiple cardiac catheterizations since most recently by Dr. Fletcher Burns via the left radial approach 11/12/2018 with stenting of his diagonal branch vein graft and his RCA vein graft.  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 Donnetta Burns.      Robert Harp MD FACP,FACC,FAHA, [facility] 02/21/2019 11:45 AM

## 2019-02-21 NOTE — Assessment & Plan Note (Signed)
History of sick sinus syndrome status post permanent transvenous pacemaker inserted 10/13/2005 followed by Dr. Croitoru. 

## 2019-02-21 NOTE — Assessment & Plan Note (Signed)
History of CAD status post remote coronary artery bypass grafting March 2004.  He has had multiple cardiac catheterizations since most recently by Dr. Fletcher Anon via the left radial approach 11/12/2018 with stenting of his diagonal branch vein graft and his RCA vein graft.  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 Donnetta Hutching.

## 2019-02-21 NOTE — Patient Instructions (Addendum)
Medication Instructions:  Your physician recommends that you continue on your current medications as directed. Please refer to the Current Medication list given to you today.  If you need a refill on your cardiac medications before your next appointment, please call your pharmacy.   Lab work: NONE  Testing/Procedures: Your physician has requested that you have a carotid duplex. This test is an ultrasound of the carotid arteries in your neck. It looks at blood flow through these arteries that supply the brain with blood. Allow one hour for this exam. There are no restrictions or special instructions.  Your physician has requested that you have a lower extremity arterial exercise duplex with ABI's in ONE YEAR. During this test, exercise and ultrasound are used to evaluate arterial blood flow in the legs. Allow one hour for this exam. There are no restrictions or special instructions.   Follow-Up: At [facility], you and your health needs are our priority.  As part of our continuing mission to provide you with exceptional heart care, we have created designated Provider Care Teams.  These Care Teams include your primary Cardiologist (physician) and Advanced Practice Providers (APPs -  Physician Assistants and Nurse Practitioners) who all work together to provide you with the care you need, when you need it. You may see Quay Burow, MD or one of the following Advanced Practice Providers on your designated Care Team:    Kerin Ransom, PA-C  [place], [place]  Coletta Memos, [place]   Your physician wants you to follow-up in: 1 year. You will receive a reminder letter in the mail two months in advance. If you don't receive a letter, please call our office to schedule the follow-up appointment.  Any Other Special Instructions Will Be Listed Below (If Applicable). Make an appointment with an APP in 6 months.

## 2019-02-21 NOTE — Assessment & Plan Note (Signed)
History of hyperlipidemia on statin therapy with lipid profile performed 04/16/2018 revealing total cholesterol of 78, LDL 33 and HDL 31

## 2019-02-21 NOTE — Assessment & Plan Note (Signed)
History of essential hypertension with blood pressure measured today 124/70.  He is on amlodipine, hydralazine, HydroDIURIL and metoprolol.

## 2019-02-21 NOTE — Assessment & Plan Note (Signed)
Extensive history of peripheral arterial disease status post bilateral SFA intervention dating back 10/30/2002 by myself on the left side.  He had a Gore-Tex graft on the left by Dr. Kellie Simmering, stenting to his left external iliac artery he underwent diamondback orbital rotational atherectomy of his right SFA 02/14/2016 with marked improvement.  Dopplers performed the following year showed the stent to be widely patent however his most recent Dopplers performed earlier this month showed a new high-frequency signal in his mid right SFA.  He does complain of some claudication.

## 2019-02-21 NOTE — Assessment & Plan Note (Signed)
History of obstructive sleep apnea on CPAP. 

## 2019-03-03 ENCOUNTER — Encounter (HOSPITAL_COMMUNITY): Payer: Medicare HMO

## 2019-03-03 ENCOUNTER — Encounter: Payer: Self-pay | Admitting: Cardiovascular Disease

## 2019-03-03 ENCOUNTER — Other Ambulatory Visit: Payer: Self-pay

## 2019-03-03 ENCOUNTER — Ambulatory Visit (INDEPENDENT_AMBULATORY_CARE_PROVIDER_SITE_OTHER): Payer: Medicare HMO | Admitting: Cardiovascular Disease

## 2019-03-03 VITALS — BP 120/64 | HR 93 | Temp 96.9°F | Ht 68.0 in | Wt 176.0 lb

## 2019-03-03 DIAGNOSIS — Z950 Presence of cardiac pacemaker: Secondary | ICD-10-CM | POA: Diagnosis not present

## 2019-03-03 DIAGNOSIS — I480 Paroxysmal atrial fibrillation: Secondary | ICD-10-CM

## 2019-03-03 DIAGNOSIS — I739 Peripheral vascular disease, unspecified: Secondary | ICD-10-CM | POA: Diagnosis not present

## 2019-03-03 DIAGNOSIS — E1151 Type 2 diabetes mellitus with diabetic peripheral angiopathy without gangrene: Secondary | ICD-10-CM

## 2019-03-03 DIAGNOSIS — I10 Essential (primary) hypertension: Secondary | ICD-10-CM | POA: Diagnosis not present

## 2019-03-03 DIAGNOSIS — I495 Sick sinus syndrome: Secondary | ICD-10-CM | POA: Diagnosis not present

## 2019-03-03 DIAGNOSIS — I25708 Atherosclerosis of coronary artery bypass graft(s), unspecified, with other forms of angina pectoris: Secondary | ICD-10-CM

## 2019-03-03 DIAGNOSIS — E785 Hyperlipidemia, unspecified: Secondary | ICD-10-CM

## 2019-03-03 MED ORDER — APIXABAN 5 MG PO TABS: 5.0000 mg | ORAL_TABLET | Freq: Two times a day (BID) | ORAL | 3 refills | Status: DC

## 2019-03-03 MED ORDER — APIXABAN 5 MG PO TABS: 5.0000 mg | ORAL_TABLET | Freq: Two times a day (BID) | ORAL | 11 refills | Status: DC

## 2019-03-03 NOTE — Patient Instructions (Addendum)
Medication Instructions:  START Eliquis 5 mg twice daily STOP the Aspirin STOP the Clopidogrel (Plavix)  *If you need a refill on your cardiac medications before your next appointment, please call your pharmacy*  Lab Work: None ordered If you have labs (blood work) drawn today and your tests are completely normal, you will receive your results only by: [name] MyChart Message (if you have MyChart) OR . A paper copy in the mail If you have any lab test that is abnormal or we need to change your treatment, we will call you to review the results.  Testing/Procedures: None ordered  Follow-Up: At [facility], you and your health needs are our priority.  As part of our continuing mission to provide you with exceptional heart care, we have created designated Provider Care Teams.  These Care Teams include your primary Cardiologist (physician) and Advanced Practice Providers (APPs -  Physician Assistants and Nurse Practitioners) who all work together to provide you with the care you need, when you need it.  Your next appointment:   12 month(s)  The format for your next appointment:   In Person  Provider:   Sanda Klein, MD

## 2019-03-03 NOTE — Progress Notes (Signed)
Cardiology Office Note:    Date:  03/05/2019   ID:  Robert Burns, DOB November 28, 1940, MRN [id number]  PCP:  [name], MD  Cardiologist:  Sanda Klein, MD (pacemaker) ; Ancil Linsey, MD  Referring MD: [name], MD   Chief complaint: Pacemaker follow-up  History of Present Illness:    Robert Burns is a 78 y.o. male with a hx of symptomatic sinus bradycardia who received a dual-chamber permanent pacemaker in 2007 with a generator change out in January 2017 (Medtronic Adapta).  Returning for follow-up.  He sees Dr. Quay Burow for coronary atherosclerosis with multiple revascularization procedures (bypass surgery 2004, drug-eluting stent to SVG-PDA and SVG to diagonal in 2016, drug-eluting stents x2 to LAD and in-stent angioplasty for restenosis SVG-diagonal and proximal limb of sequential SVG-OM 02/08/2018) and PAD (angioplasty left SFA 2004 and stent September 2017, right SFA November 2017).  He has normal left ventricular systolic function by echo last performed 02/07/2018, with "posterior basal hypokinesis".  He has biatrial dilation and right ventricular dilation by echo.  He presented with unstable angina on February 04, 2018 and cardiac catheterization which showed multiple coronary stenoses involving both the native and graft vessels.  He was evaluated for redo bypass surgery but felt to be a poor candidate and subsequently underwent revascularization percutaneously by Dr. Irish Lack.  He received 2 drug-eluting stents to the LAD artery and had angioplasty to restenotic lesions previously placed stents to have his vein grafts (SVG-diagonal, proximal limb of sequential SVG-OM-PLV with distal occluded).  He does not have occlusion of the native right coronary artery and had several lesions in the saphenous vein graft to the RCA including an 80% distal stenosis.  He was readmitted in August 2020 with unstable angina and negative cardiac enzymes and had a stent to the distal  segment of the SVG to PDA as well as a stent in the SVG to the diagonal artery.  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 Donnetta Hutching.  Since then he has had very infrequent episodes of exertional angina or dyspnea.  His recent pacemaker download showed several episodes of atrial fibrillation including one that lasted for 71 minutes.  He was unaware of these.  Ventricular rates are well controlled in the 81-99 range.  The overall burden of arrhythmia is very low, less than 0.1%.  Otherwise he continues to have almost 100% atrial pacing.  He only paces the ventricle 1% of the time.  Generator longevity is estimated at about 10 years.  Lead parameters are good.  The heart rate histogram distribution appears appropriate.  His pacemaker had previously recorded a single episode of atrial fibrillation that occurred on November 01, 2017, lasting for 4-1/2 hours.     He does not have a history of stroke/TIA or other embolic events.  Past Medical History:  Diagnosis Date  . Arthritis    "all over" (11/25/2015)  . CAD (coronary artery disease)    a. s/p CABG  06/2002; b. 02/01/15 PCI: DES to prox SVG to PDA, staged PCI of SVG to Diag in 02/2015; c. 04/2017 Cath/PCI: LM nl, LAD 100ost, 35m, 75d, LCX 60ost, OM2 80, RCA 100ost, RPDA 80, LIMA->LAD ok, VG->D1 patent stent, VG->RPDA patent stent, 50p, VG->OM1->OM2 90p (3.0x24 Synergy DES), 100 between OM1->OM2 (med rx).  . Cancer ([place])    Right Shoulder, Left Leg- BCC, SCC, AND MELANOMA  . Epithelioid hemangioendothelioma    s/p resection of left SFA/mass with interposition  6 mm GoreTex graft 06/02/10, s/p repeat resection for positive margins 09/22/10  . Hyperlipidemia   . Hypertension   . Leg pain   . OSA on CPAP   . PAD (peripheral artery disease) ([place])    a. 10/2002 L SFA PTA/BMS; b. 8/17 LE Angio: LEIA 90 (9x40 self exp stent), LSFA short segment prox occlusion (staged PTA/stenting 01/03/2016), patent mid stent,  RSFA 4m (staged PTA/DEB 02/14/2016).  . Presence of permanent cardiac pacemaker    Medtronic  . SSS (sick sinus syndrome) ([place])    a. s/p PPM in 2007 with gen change 04/2015 - Medtronic Adapta ADDRL1, ser # [id number] H.  . Type II diabetes mellitus ([place])    Type II  . Urgency of urination     Past Surgical History:  Procedure Laterality Date  . CARDIAC CATHETERIZATION N/A 02/01/2015   Procedure: Left Heart Cath and Coronary Angiography;  Surgeon: Lorretta Harp, MD;  Location: [facility];  Service: Cardiovascular;  Laterality: N/A;  . CARDIAC CATHETERIZATION N/A 02/01/2015   Procedure: Coronary Stent Intervention;  Surgeon: Lorretta Harp, MD;  Location: [facility];  Service: Cardiovascular;  Laterality: N/A;  . CARDIAC CATHETERIZATION  06/2002   "just before bypass OR"  . CARDIAC CATHETERIZATION N/A 03/01/2015   Procedure: Coronary Stent Intervention;  Surgeon: Lorretta Harp, MD;  Location: [facility];  Service: Cardiovascular;  Laterality: N/A;  . CARDIAC CATHETERIZATION  02/06/2018  . COLONOSCOPY    . CORONARY ANGIOPLASTY    . CORONARY ARTERY BYPASS GRAFT  06/2002   x5, LIMA-LAD;VG- Diag; seq VG- ramus & OM branch; VG-PDA  . CORONARY STENT INTERVENTION N/A 04/12/2017   Procedure: CORONARY STENT INTERVENTION;  Surgeon: Lorretta Harp, MD;  Location: [facility];  Service: Cardiovascular;  Laterality: N/A;  . CORONARY STENT INTERVENTION N/A 02/08/2018   Procedure: CORONARY STENT INTERVENTION;  Surgeon: Jettie Booze, MD;  Location: [facility];  Service: Cardiovascular;  Laterality: N/A;  . CORONARY STENT INTERVENTION N/A 11/12/2018   Procedure: CORONARY STENT INTERVENTION;  Surgeon: Wellington Hampshire, MD;  Location: [facility];  Service: Cardiovascular;  Laterality: N/A;  . EP IMPLANTABLE DEVICE N/A 04/23/2015   Procedure: PPM Generator Changeout;  Surgeon: Sanda Klein, MD;  Location: [facility];  Service:  Cardiovascular;  Laterality: N/A;  . FALSE ANEURYSM REPAIR Left 11/29/2018   Procedure: REPAIR FALSE ANEURYSM LEFT RADIAL ARTERY;  Surgeon: [name], MD;  Location: [facility];  Service: Vascular;  Laterality: Left;  . FEMORAL ARTERY STENT Left ~ 2014   "taken out of my leg; couldn' catorgorize what kind so the put it under all 3"; cataroziepitheloid hemanioendotheliomau  . FOOT FRACTURE SURGERY Left 1973  . FRACTURE SURGERY    . INSERT / REPLACE / REMOVE PACEMAKER  10/13/05   right side, medtronic Adapta  . KNEE HARDWARE REMOVAL Right 1950's   "3-4 months after the insertion"  . KNEE SURGERY Right 1950's   "broke my lower leg; had to put pin in my knee to keep lower leg in place til it healed"  . LEFT HEART CATH AND CORS/GRAFTS ANGIOGRAPHY N/A 04/12/2017   Procedure: LEFT HEART CATH AND CORS/GRAFTS ANGIOGRAPHY;  Surgeon: Lorretta Harp, MD;  Location: [facility];  Service: Cardiovascular;  Laterality: N/A;  . LEFT HEART CATH AND CORS/GRAFTS ANGIOGRAPHY N/A 02/06/2018   Procedure: LEFT HEART CATH AND CORS/GRAFTS ANGIOGRAPHY;  Surgeon: Troy Sine, MD;  Location: [facility]  IN[facility];  Service: Cardiovascular;  Laterality: N/A;  . LEFT HEART CATH AND CORS/GRAFTS ANGIOGRAPHY N/A 11/12/2018   Procedure: LEFT HEART CATH AND CORS/GRAFTS ANGIOGRAPHY;  Surgeon: Wellington Hampshire, MD;  Location: [facility];  Service: Cardiovascular;  Laterality: N/A;  . PERIPHERAL VASCULAR CATHETERIZATION N/A 11/25/2015   Procedure: Lower Extremity Angiography;  Surgeon: Lorretta Harp, MD;  Location: [facility];  Service: Cardiovascular;  Laterality: N/A;  . PERIPHERAL VASCULAR CATHETERIZATION Left 11/25/2015   Procedure: Peripheral Vascular Intervention;  Surgeon: Lorretta Harp, MD;  Location: [facility];  Service: Cardiovascular;  Laterality: Left;  external iliac  . PERIPHERAL VASCULAR CATHETERIZATION N/A 01/03/2016   Procedure: Lower Extremity Angiography;  Surgeon: Lorretta Harp, MD;  Location: [facility];  Service: Cardiovascular;  Laterality: N/A;  . PERIPHERAL VASCULAR CATHETERIZATION Left 01/03/2016   Procedure: Peripheral Vascular Intervention;  Surgeon: Lorretta Harp, MD;  Location: [facility];  Service: Cardiovascular;  Laterality: Left;  SFA  . PERIPHERAL VASCULAR CATHETERIZATION Right 02/14/2016   Procedure: Peripheral Vascular Atherectomy;  Surgeon: Lorretta Harp, MD;  Location: [facility];  Service: Cardiovascular;  Laterality: Right;  SFA  . POPLITEAL ARTERY STENT  01/03/2016   Contralateral access with a 7 French crossover sheath (second order catheter placement)  . TONSILLECTOMY AND ADENOIDECTOMY    . TUMOR EXCISION Right ~ 2005   cancerous tumor removed from shoulder  . TUMOR EXCISION Right ~ 2000   benign tumor removed from under shoulder  . TUMOR EXCISION Left 06/02/2010   resection of Lt SFA wth interposition of Gore-Tex graft    Current Medications: Current Meds  Medication Sig  . amLODipine (NORVASC) 10 MG tablet Take 10 mg by mouth daily.   [name] atorvastatin (LIPITOR) 80 MG tablet Take 1 tablet (80 mg total) by mouth daily at 6 PM.  . Cholecalciferol (VITAMIN D3) 2000 units capsule Take 2,000 Units by mouth 2 (two) times daily.  [name] glipiZIDE (GLUCOTROL) 5 MG tablet Take 5 mg by mouth daily with breakfast.   . hydrALAZINE (APRESOLINE) 25 MG tablet Take 25 mg by mouth 2 (two) times daily.   . hydrochlorothiazide (HYDRODIURIL) 25 MG tablet Take 25 mg by mouth every morning.  [name] HYDROcodone-acetaminophen (NORCO) 5-325 MG tablet Take 1 tablet by mouth every 4 (four) hours as needed for moderate pain.  . isosorbide mononitrate (IMDUR) 60 MG 24 hr tablet Take 1 tablet (60 mg total) by mouth daily.  . metFORMIN (GLUCOPHAGE) 1000 MG tablet Take 1 tablet (1,000 mg total) by mouth 2 (two) times daily with a meal.  . metoprolol tartrate (LOPRESSOR) 100 MG tablet Take 1 tablet (100 mg total) by mouth 2 (two) times daily.  .  nitroGLYCERIN (NITROSTAT) 0.4 MG SL tablet Place 1 tablet (0.4 mg total) under the tongue every 5 (five) minutes as needed for chest pain.  . potassium chloride SA (K-DUR,KLOR-CON) 20 MEQ tablet Take 1 tablet (20 mEq total) by mouth daily.  . tamsulosin (FLOMAX) 0.4 MG CAPS capsule Take 0.4 mg by mouth 2 (two) times daily.   . [DISCONTINUED] aspirin 81 MG chewable tablet Chew 1 tablet (81 mg total) by mouth daily.  . [DISCONTINUED] aspirin EC 81 MG tablet Take 81 mg by mouth daily.  . [DISCONTINUED] clopidogrel (PLAVIX) 75 MG tablet Take 1 tablet (75 mg total) by mouth daily with breakfast. (Patient taking differently: Take 75 mg by mouth every morning. )  Allergies:   Peanut-containing drug products, Ace inhibitors, Diltiazem hcl, Meloxicam, Carvedilol, Clonidine hcl, and Duloxetine hcl   Social History   Socioeconomic History  . Marital status: Married    Spouse name: Not on file  . Number of children: Not on file  . Years of education: Not on file  . Highest education level: Not on file  Occupational History  . Not on file  Social Needs  . Financial resource strain: Not on file  . Food insecurity    Worry: Not on file    Inability: Not on file  . Transportation needs    Medical: Not on file    Non-medical: Not on file  Tobacco Use  . Smoking status: Former Smoker    Years: 10.00    Types: Pipe    Quit date: 1973    Years since quitting: 47.9  . Smokeless tobacco: Never Used  . Tobacco comment: "quit smoking in 1973"  Substance and Sexual Activity  . Alcohol use: No  . Drug use: No  . Sexual activity: Not Currently  Lifestyle  . Physical activity    Days per week: Not on file    Minutes per session: Not on file  . Stress: Not on file  Relationships  . Social Herbalist on phone: Not on file    Gets together: Not on file    Attends religious service: Not on file    Active member of club or organization: Not on file    Attends meetings of clubs or  organizations: Not on file    Relationship status: Not on file  Other Topics Concern  . Not on file  Social History Narrative  . Not on file     Family History: The patient's family history includes Coronary artery disease in his mother; Diabetes in his father and sister; Heart attack in his father and mother; Heart disease in his father, mother, and sister; Hyperlipidemia in his father; Hypertension in his mother and sister. ROS:   Please see the history of present illness.    All other systems reviewed and are negative.  EKGs/Labs/Other Studies Reviewed:    EKG:  EKG is not ordered today.  The tracing from 11/13/2018 shows atrial paced, ventricular sensed rhythm with a very broad atypical right bundle branch block and delayed R wave progression.  Recent Labs: 11/11/2018: ALT 20 11/29/2018: BUN 22; Creatinine, Ser 0.96; Hemoglobin 12.6; Platelets 171; Potassium 3.9; Sodium 140  Recent Lipid Panel 04/16/2018 total cholesterol 78, HDL 31, LDL 33, triglycerides 69  Physical Exam:    VS:  BP 120/64 (BP Location: Left Arm, Patient Position: Sitting, Cuff Size: Normal)   Pulse 93   Temp (!) 96.9 F (36.1 C)   Ht 5\' 8"  (1.727 m)   Wt 176 lb (79.8 kg)   BMI 26.76 kg/m     Wt Readings from Last 3 Encounters:  03/03/19 176 lb (79.8 kg)  02/21/19 176 lb (79.8 kg)  12/19/18 172 lb (78 kg)    General: Alert, oriented x3, no distress, healthy left subclavian pacemaker site Head: no evidence of trauma, PERRL, EOMI, no exophtalmos or lid lag, no myxedema, no xanthelasma; normal ears, nose and oropharynx Neck: normal jugular venous pulsations and no hepatojugular reflux; brisk carotid pulses without delay and no carotid bruits Chest: clear to auscultation, no signs of consolidation by percussion or palpation, normal fremitus, symmetrical and full respiratory excursions Cardiovascular: normal position and quality of the apical impulse, regular rhythm,  normal first and widely split second  heart sounds, no murmurs, rubs or gallops Abdomen: no tenderness or distention, no masses by palpation, no abnormal pulsatility or arterial bruits, normal bowel sounds, no hepatosplenomegaly Extremities: no clubbing, cyanosis or edema; 2+ radial, ulnar and brachial pulses bilaterally; 2+ right femoral, posterior tibial and dorsalis pedis pulses; 2+ left femoral, posterior tibial and dorsalis pedis pulses; no subclavian or femoral bruits Neurological: grossly nonfocal Psych: Normal mood and affect  ASSESSMENT:    1. Coronary artery disease of bypass graft of native heart with stable angina pectoris (HCC)   2. Paroxysmal atrial fibrillation ([place])   3. SSS (sick sinus syndrome), medtronic adapta   4. Pacemaker   5. PAD (peripheral artery disease) ([place])   6. Dyslipidemia (high LDL; low HDL)   7. Essential hypertension   8. DM (diabetes mellitus), type 2 with peripheral vascular complications (HCC)    PLAN:    In order of problems listed above:  1. CAD: Doing generally well, functional class I-II exertional angina.    He is on triple antianginal therapy taking amlodipine, metoprolol and long-acting nitrates. 2. Parox AFib: Asymptomatic, detected by his pacemaker.  CHADSVasc 5 (age, hypertension, vascular disease, diabetes).  The frequency of episodes of atrial fibrillation has increased and I think he needs to be on anticoagulants to reduce the risk of stroke or other embolic events.  We discussed the risk of bleeding complications.  He needs to continue clopidogrel at least through August 2021, but probably lifelong.  We will stop his aspirin since has been well over a month since the last intervention.  We gave him a 30-day card for apixaban and a years worth of refills 3. SSS: Satisfactory sensor settings, no symptoms of chronotropic incompetence. 4. PPM: Normal device function.  He has normal AV conduction but paces the atrium virtually 100% of the time.  Remote downloads every 3 months. 5.  PAD: Followed by Dr. Gwenlyn Found, currently without intermittent claudication. 6. HLP: Excellent LDL cholesterol on statin therapy.  Chronically low HDL. 7. HTN: Adequately controlled. 8. DM: Most recent A1c was too high at 8.2%.   Medication Adjustments/Labs and Tests Ordered: Current medicines are reviewed at length with the patient today.  Concerns regarding medicines are outlined above.  No orders of the defined types were placed in this encounter.  Meds ordered this encounter  Medications  . DISCONTD: apixaban (ELIQUIS) 5 MG TABS tablet    Sig: Take 1 tablet (5 mg total) by mouth 2 (two) times daily.    Dispense:  60 tablet    Refill:  11  . apixaban (ELIQUIS) 5 MG TABS tablet    Sig: Take 1 tablet (5 mg total) by mouth 2 (two) times daily.    Dispense:  180 tablet    Refill:  3    Signed, Sanda Klein, MD  03/05/2019 1:26 PM    [facility]

## 2019-03-05 DIAGNOSIS — E785 Hyperlipidemia, unspecified: Secondary | ICD-10-CM | POA: Insufficient documentation

## 2019-03-25 ENCOUNTER — Ambulatory Visit (HOSPITAL_COMMUNITY)
Admission: RE | Admit: 2019-03-25 | Discharge: 2019-03-25 | Disposition: A | Discharge status: Home / Self Care | Payer: Medicare HMO | Source: Ambulatory Visit | Attending: Cardiovascular Disease | Admitting: Cardiovascular Disease

## 2019-03-25 ENCOUNTER — Other Ambulatory Visit (HOSPITAL_COMMUNITY): Payer: Self-pay | Admitting: Cardiovascular Disease

## 2019-03-25 ENCOUNTER — Other Ambulatory Visit: Payer: Self-pay

## 2019-03-25 DIAGNOSIS — I6523 Occlusion and stenosis of bilateral carotid arteries: Secondary | ICD-10-CM

## 2019-03-25 DIAGNOSIS — I739 Peripheral vascular disease, unspecified: Secondary | ICD-10-CM | POA: Diagnosis not present

## 2019-03-25 DIAGNOSIS — I480 Paroxysmal atrial fibrillation: Secondary | ICD-10-CM | POA: Diagnosis not present

## 2019-03-25 DIAGNOSIS — R0989 Other specified symptoms and signs involving the circulatory and respiratory systems: Secondary | ICD-10-CM

## 2019-03-26 ENCOUNTER — Telehealth: Payer: Self-pay | Admitting: Cardiovascular Disease

## 2019-03-26 ENCOUNTER — Other Ambulatory Visit: Payer: Self-pay

## 2019-03-26 DIAGNOSIS — L821 Other seborrheic keratosis: Secondary | ICD-10-CM | POA: Diagnosis not present

## 2019-03-26 DIAGNOSIS — R42 Dizziness and giddiness: Secondary | ICD-10-CM

## 2019-03-26 DIAGNOSIS — C44629 Squamous cell carcinoma of skin of left upper limb, including shoulder: Secondary | ICD-10-CM | POA: Diagnosis not present

## 2019-03-26 DIAGNOSIS — C4442 Squamous cell carcinoma of skin of scalp and neck: Secondary | ICD-10-CM | POA: Diagnosis not present

## 2019-03-26 DIAGNOSIS — L570 Actinic keratosis: Secondary | ICD-10-CM | POA: Diagnosis not present

## 2019-03-26 DIAGNOSIS — R410 Disorientation, unspecified: Secondary | ICD-10-CM

## 2019-03-26 DIAGNOSIS — I25708 Atherosclerosis of coronary artery bypass graft(s), unspecified, with other forms of angina pectoris: Secondary | ICD-10-CM

## 2019-03-26 DIAGNOSIS — L578 Other skin changes due to chronic exposure to nonionizing radiation: Secondary | ICD-10-CM | POA: Diagnosis not present

## 2019-03-26 NOTE — Telephone Encounter (Signed)
    Daughter calling to report  1. Name of Medication: Eliquis  2. How are you currently taking this medication (dosage and times per day)? As written  3. Are you having a reaction (difficulty breathing--STAT)? no  4. What is your medication issue? Dizziness, "cold" feeling in chest

## 2019-03-26 NOTE — Telephone Encounter (Signed)
Confirmed with daughter about CT scan.  Will hold off anticoagulation until results to Dr. Loletha Grayer.

## 2019-03-26 NOTE — Telephone Encounter (Signed)
The only way to Eliquis could be causing the dizziness, confusion, difficulty finding words if he has an intracranial hemorrhage, which is obviously a very serious condition.  Has he had any recent falls or injuries?  Any trauma to the head?  Please stop the Eliquis.  I would recommend a noncontrast head CT , which we can probably get tomorrow. The symptoms described are nonspecific and do not sound like an emergency, but if he has any decline in his level of consciousness or begins complaining of a headache, I would take him to the emergency room tonight.

## 2019-03-26 NOTE — Telephone Encounter (Signed)
Pts daughter, [name] on [place], called to report that the pt had started Eliquis one week ago and he has been experiencing a "cold" feeling in his chest.. no cough, no SOB, no pain.... he has been having some dizziness, and some confusion/ unable to get his thoughts out and this is unusual for him, he is not slurring his words but he has to stop and think about what he is trying to say.. she is asking if the Eliquis could be contributing to these symptoms. Will forward to Dr. Sallyanne Kuster RPH.

## 2019-03-26 NOTE — Telephone Encounter (Signed)
LM with detailed information for Robert Burns and on the pts home number... N/A on pts cell.[name]   RE: Dr. Victorino Burns instructions.   Will order head CT for 03/27/19 and send to the [facility] to schedule ASAP .   Addendum: I was able to get in touch with pt and he verbalized understanding and will hold his Eliquis until he has his CT 03/27/19.

## 2019-03-26 NOTE — Telephone Encounter (Signed)
LMTCB/ [name] pts daughter on [place]

## 2019-03-27 ENCOUNTER — Ambulatory Visit
Admission: RE | Admit: 2019-03-27 | Discharge: 2019-03-27 | Disposition: A | Discharge status: Home / Self Care | Payer: Medicare HMO | Source: Ambulatory Visit | Attending: Cardiovascular Disease | Admitting: Cardiovascular Disease

## 2019-03-27 ENCOUNTER — Inpatient Hospital Stay: Admission: RE | Admit: 2019-03-27 | Payer: Medicare HMO | Source: Ambulatory Visit

## 2019-03-27 ENCOUNTER — Telehealth: Payer: Self-pay | Admitting: *Deleted

## 2019-03-27 DIAGNOSIS — R4182 Altered mental status, unspecified: Secondary | ICD-10-CM | POA: Diagnosis not present

## 2019-03-27 DIAGNOSIS — R42 Dizziness and giddiness: Secondary | ICD-10-CM | POA: Diagnosis not present

## 2019-03-27 DIAGNOSIS — R410 Disorientation, unspecified: Secondary | ICD-10-CM

## 2019-03-27 NOTE — Telephone Encounter (Signed)
Spoke to pt daughter, [name] per [place]. States that she was under the impression that the pt needs to have the CT done sooner than Monday. With reading through the encounters about the pt symptoms, agreed with pt daughter. Asked daughter what symptoms the pt was currently having, stated he was just very tired and sleeps a lot. Asked about headache, pt daughter stated they went to Dermatologist yesterday and they found some possible cancerous spots on head, stated that caused pt to have headache, but that always happens at dermatologist. Asked daughter about decline in consciousness, stated no, just tired. Advised daughter if pt begins to have headache or consciousness declines to go to ER. Verbalized understanding. Reached out to scheduling to see if CT can be done sooner, scheduling stated that it has to go through Prior auth first and the pt would have to be a walk in at [facility]. Schedulers are working on expediting prior auth to get pt in sooner. Advised daughter to keep appt for 12/21 for CT and out schedulers will reach out as soon as the prior Josem Kaufmann goes in. Verbalized understanding.

## 2019-03-27 NOTE — Telephone Encounter (Signed)
Spoke with daughter Robert Burns How is on his DPR) regarding appointment for CT head.  Insurance has been prior authorized and Robert Burns will be considered a "walk in" appointment this morning at [facility] [facility] [address] [facility].  Patient has to arrive before 11:00 am in order for the scan to be completed.  [name] voiced her understanding and states she will have the patient there to get the testing completed.

## 2019-03-27 NOTE — Telephone Encounter (Signed)
Spoke with pt daughter, aware of results. She will have the patient restart the eliquis tonight. She had discussed with the pharm md regarding trying xarelto. Samples placed at the front desk for her to pick up.

## 2019-03-27 NOTE — Telephone Encounter (Addendum)
Left message for pt to call    ----- Message from Sanda Klein, MD sent at 03/27/2019 12:45 PM EST ----- No sign of bleeding on head CT. No acute stroke or other abnormality. There are some chronic age-related changes, that may explain some of the difficulty finding words and other complaints. I suspect the association with the Eliquis is coincidental. I would recommend restarting/continuing the Eliquis and checking in with PCP or neurologist about his other complaints.

## 2019-03-27 NOTE — Telephone Encounter (Signed)
Follow up    Daughter calling, can patient wait until Monday 12/21 to have CT scan. Please call

## 2019-03-28 ENCOUNTER — Other Ambulatory Visit: Payer: Self-pay | Admitting: Pharmacist Clinician (PhC)/ Clinical Pharmacy Specialist

## 2019-03-28 MED ORDER — RIVAROXABAN 20 MG PO TABS: 20.0000 mg | ORAL_TABLET | Freq: Every day | ORAL | 0 refills | Status: DC

## 2019-03-28 NOTE — Telephone Encounter (Signed)
D/c eliquis

## 2019-03-28 NOTE — Telephone Encounter (Signed)
CT scan clear, no sign of hemorrhage.  Will have patient try Xarelto 20 mg qd.  (CrCl 69.3).  Gave 2 weeks samples for patient to try before filling prescription.

## 2019-03-31 ENCOUNTER — Other Ambulatory Visit: Payer: Medicare HMO

## 2019-04-08 ENCOUNTER — Other Ambulatory Visit: Payer: Self-pay

## 2019-04-08 MED ORDER — RIVAROXABAN 20 MG PO TABS: 20.0000 mg | ORAL_TABLET | Freq: Every day | ORAL | 3 refills | Status: DC

## 2019-04-08 MED ORDER — RIVAROXABAN 20 MG PO TABS: 20.0000 mg | ORAL_TABLET | Freq: Every day | ORAL | 11 refills | Status: DC

## 2019-04-08 NOTE — Telephone Encounter (Signed)
S/w pt he states that xarelto is working well for him he need an rx sent to local and to mail in pharmacy Rx sent to both pharmacys

## 2019-04-09 DIAGNOSIS — S40021A Contusion of right upper arm, initial encounter: Secondary | ICD-10-CM | POA: Diagnosis not present

## 2019-04-28 ENCOUNTER — Other Ambulatory Visit: Payer: Self-pay | Admitting: Cardiovascular Disease

## 2019-04-28 NOTE — Telephone Encounter (Signed)
*  STAT* If patient is at the pharmacy, call can be transferred to refill team.   1. Which medications need to be refilled? (please list name of each medication and dose if known) rivaroxaban (XARELTO) 20 MG TABS tablet  2. Which pharmacy/location (including street and city if local pharmacy) is medication to be sent to? [place], [place]  3. Do they need a 30 day or 90 day supply? 90 day   Patient only has 15 tablets left. He states you may have to reach to [facility] because they denied the last request stating the last prescription was filled to close to new request.

## 2019-04-29 ENCOUNTER — Ambulatory Visit (INDEPENDENT_AMBULATORY_CARE_PROVIDER_SITE_OTHER): Payer: Medicare HMO | Admitting: *Deleted

## 2019-04-29 DIAGNOSIS — I480 Paroxysmal atrial fibrillation: Secondary | ICD-10-CM

## 2019-04-30 LAB — CUP PACEART REMOTE DEVICE CHECK
Battery Impedance: 234 Ohm
Battery Remaining Longevity: 119 mo
Battery Voltage: 2.8 V
Brady Statistic AP VP Percent: 3 %
Brady Statistic AP VS Percent: 96 %
Brady Statistic AS VP Percent: 0 %
Brady Statistic AS VS Percent: 0 %
Date Time Interrogation Session: 20210119200036
Implantable Lead Implant Date: 20070706
Implantable Lead Implant Date: 20070706
Implantable Lead Location: 753859
Implantable Lead Location: 753860
Implantable Lead Model: 5076
Implantable Lead Model: 5092
Implantable Pulse Generator Implant Date: 20170113
Lead Channel Impedance Value: 391 Ohm
Lead Channel Impedance Value: 658 Ohm
Lead Channel Pacing Threshold Amplitude: 0.875 V
Lead Channel Pacing Threshold Amplitude: 1.375 V
Lead Channel Pacing Threshold Pulse Width: 0.4 ms
Lead Channel Pacing Threshold Pulse Width: 0.4 ms
Lead Channel Setting Pacing Amplitude: 1.75 V
Lead Channel Setting Pacing Amplitude: 2.75 V
Lead Channel Setting Pacing Pulse Width: 0.4 ms
Lead Channel Setting Sensing Sensitivity: 5.6 mV

## 2019-05-01 MED ORDER — RIVAROXABAN 20 MG PO TABS: 20.0000 mg | ORAL_TABLET | Freq: Every day | ORAL | 3 refills | Status: DC

## 2019-05-01 NOTE — Telephone Encounter (Signed)
[employer] has to override a duplicate code d/t recently dispensed Eliquis (90 days supply on Nov/2020). May need prior-authorization. We should rceived an answer in 24-48 hrs.   LMOM; patient may stop by office to pick up samples for Xarelto 20mg  (@ front desk already)    Drug name: Xarelto      Strength: 20mg         Qty: 35tabs  LOT: [id number] Exp.Date: 11-21

## 2019-05-05 NOTE — Telephone Encounter (Signed)
[facility] to check status of prescription.  Currently is in review department, tech notes that the information about switch is listed. Advised we can only wait for processing

## 2019-05-06 ENCOUNTER — Telehealth: Payer: Self-pay | Admitting: Cardiovascular Disease

## 2019-05-06 NOTE — Telephone Encounter (Signed)
Prior auth for Xarelto 20 mg once daily has been faxed to [phone]

## 2019-05-06 NOTE — Telephone Encounter (Signed)
[employer] [facility] was calling to check on the Prior Authorization for this patient's Xarelto.   Please call [phone] and use Reference # [id number]

## 2019-05-06 NOTE — Telephone Encounter (Signed)
Talked to patient today. Gave update on Xarelto Rx. He will come to pick up samples today or tomorrow.

## 2019-05-07 ENCOUNTER — Telehealth: Payer: Self-pay | Admitting: Cardiovascular Disease

## 2019-05-07 NOTE — Telephone Encounter (Signed)
Xarelto has been approved through 04/09/20.

## 2019-05-07 NOTE — Telephone Encounter (Signed)
New Message    Pt is calling and says his family friend, Malachi Bonds, will be picking up his medication for him today   Please advise

## 2019-05-07 NOTE — Telephone Encounter (Signed)
LMTCB... unsure if samples at the office.. will forward to [place].. noted his prior authorization has been completed.

## 2019-05-20 DIAGNOSIS — H35372 Puckering of macula, left eye: Secondary | ICD-10-CM | POA: Diagnosis not present

## 2019-05-20 DIAGNOSIS — H35351 Cystoid macular degeneration, right eye: Secondary | ICD-10-CM | POA: Diagnosis not present

## 2019-05-20 DIAGNOSIS — H34811 Central retinal vein occlusion, right eye, with macular edema: Secondary | ICD-10-CM | POA: Diagnosis not present

## 2019-05-27 ENCOUNTER — Telehealth: Payer: Self-pay

## 2019-05-27 NOTE — Telephone Encounter (Signed)
Pt called device clinic asking to have his recent PM transmission be read to determine if Xarelto has been effective.  Advised pt that Xarelto is preventative medication, it does not treat AF, therefore a PM transmission will not tell us if it has been effective.  Pt indicates that the medication is costing him $500/ month and he wants to know that it is even working.  Again explained [place] as preventative medication, protecting him from having a stroke.  Pt did not appear satisfied with that answer.  Advised I would forward message to Dr. Victorino December nurse to follow-up with him.

## 2019-05-27 NOTE — Telephone Encounter (Signed)
Left a message for the patient to call back.  

## 2019-05-27 NOTE — Telephone Encounter (Signed)
The pt wanted to know if the new medicine that dr.C prescribe is helping him. I let him speak with device nurse Amy, Rn.

## 2019-05-29 NOTE — Telephone Encounter (Signed)
Left a message for the patient to call back.  

## 2019-06-02 NOTE — Telephone Encounter (Signed)
The patient has agreed to try for assistance for the Xarelto. The forms have been mailed for him to fill out.

## 2019-06-04 DIAGNOSIS — M170 Bilateral primary osteoarthritis of knee: Secondary | ICD-10-CM | POA: Diagnosis not present

## 2019-06-04 DIAGNOSIS — N401 Enlarged prostate with lower urinary tract symptoms: Secondary | ICD-10-CM | POA: Diagnosis not present

## 2019-06-04 DIAGNOSIS — I10 Essential (primary) hypertension: Secondary | ICD-10-CM | POA: Diagnosis not present

## 2019-06-04 DIAGNOSIS — M160 Bilateral primary osteoarthritis of hip: Secondary | ICD-10-CM | POA: Diagnosis not present

## 2019-06-04 DIAGNOSIS — E1142 Type 2 diabetes mellitus with diabetic polyneuropathy: Secondary | ICD-10-CM | POA: Diagnosis not present

## 2019-06-04 DIAGNOSIS — E1151 Type 2 diabetes mellitus with diabetic peripheral angiopathy without gangrene: Secondary | ICD-10-CM | POA: Diagnosis not present

## 2019-06-04 DIAGNOSIS — I25709 Atherosclerosis of coronary artery bypass graft(s), unspecified, with unspecified angina pectoris: Secondary | ICD-10-CM | POA: Diagnosis not present

## 2019-06-04 DIAGNOSIS — E7800 Pure hypercholesterolemia, unspecified: Secondary | ICD-10-CM | POA: Diagnosis not present

## 2019-06-19 DIAGNOSIS — E1151 Type 2 diabetes mellitus with diabetic peripheral angiopathy without gangrene: Secondary | ICD-10-CM | POA: Diagnosis not present

## 2019-06-19 DIAGNOSIS — N401 Enlarged prostate with lower urinary tract symptoms: Secondary | ICD-10-CM | POA: Diagnosis not present

## 2019-06-19 DIAGNOSIS — E1142 Type 2 diabetes mellitus with diabetic polyneuropathy: Secondary | ICD-10-CM | POA: Diagnosis not present

## 2019-06-19 DIAGNOSIS — G4733 Obstructive sleep apnea (adult) (pediatric): Secondary | ICD-10-CM | POA: Diagnosis not present

## 2019-06-19 DIAGNOSIS — I25709 Atherosclerosis of coronary artery bypass graft(s), unspecified, with unspecified angina pectoris: Secondary | ICD-10-CM | POA: Diagnosis not present

## 2019-06-19 DIAGNOSIS — Z0000 Encounter for general adult medical examination without abnormal findings: Secondary | ICD-10-CM | POA: Diagnosis not present

## 2019-06-19 DIAGNOSIS — Z1389 Encounter for screening for other disorder: Secondary | ICD-10-CM | POA: Diagnosis not present

## 2019-06-19 DIAGNOSIS — I739 Peripheral vascular disease, unspecified: Secondary | ICD-10-CM | POA: Diagnosis not present

## 2019-06-19 DIAGNOSIS — I10 Essential (primary) hypertension: Secondary | ICD-10-CM | POA: Diagnosis not present

## 2019-06-19 DIAGNOSIS — E7800 Pure hypercholesterolemia, unspecified: Secondary | ICD-10-CM | POA: Diagnosis not present

## 2019-06-19 DIAGNOSIS — Z23 Encounter for immunization: Secondary | ICD-10-CM | POA: Diagnosis not present

## 2019-06-24 DIAGNOSIS — Z7984 Long term (current) use of oral hypoglycemic drugs: Secondary | ICD-10-CM | POA: Diagnosis not present

## 2019-06-24 DIAGNOSIS — E1169 Type 2 diabetes mellitus with other specified complication: Secondary | ICD-10-CM | POA: Diagnosis not present

## 2019-06-24 DIAGNOSIS — I10 Essential (primary) hypertension: Secondary | ICD-10-CM | POA: Diagnosis not present

## 2019-06-24 DIAGNOSIS — Z7901 Long term (current) use of anticoagulants: Secondary | ICD-10-CM | POA: Diagnosis not present

## 2019-06-24 DIAGNOSIS — D692 Other nonthrombocytopenic purpura: Secondary | ICD-10-CM | POA: Diagnosis not present

## 2019-06-24 DIAGNOSIS — G4733 Obstructive sleep apnea (adult) (pediatric): Secondary | ICD-10-CM | POA: Diagnosis not present

## 2019-06-24 DIAGNOSIS — I480 Paroxysmal atrial fibrillation: Secondary | ICD-10-CM | POA: Diagnosis not present

## 2019-06-24 DIAGNOSIS — F331 Major depressive disorder, recurrent, moderate: Secondary | ICD-10-CM | POA: Diagnosis not present

## 2019-06-24 DIAGNOSIS — D6869 Other thrombophilia: Secondary | ICD-10-CM | POA: Diagnosis not present

## 2019-06-24 DIAGNOSIS — J9610 Chronic respiratory failure, unspecified whether with hypoxia or hypercapnia: Secondary | ICD-10-CM | POA: Diagnosis not present

## 2019-06-26 DIAGNOSIS — F320 Major depressive disorder, single episode, mild: Secondary | ICD-10-CM | POA: Diagnosis not present

## 2019-07-29 ENCOUNTER — Ambulatory Visit (INDEPENDENT_AMBULATORY_CARE_PROVIDER_SITE_OTHER): Payer: Medicare HMO | Admitting: *Deleted

## 2019-07-29 DIAGNOSIS — I480 Paroxysmal atrial fibrillation: Secondary | ICD-10-CM

## 2019-07-29 LAB — CUP PACEART REMOTE DEVICE CHECK
Battery Impedance: 283 Ohm
Battery Remaining Longevity: 113 mo
Battery Voltage: 2.79 V
Brady Statistic AP VP Percent: 3 %
Brady Statistic AP VS Percent: 97 %
Brady Statistic AS VP Percent: 0 %
Brady Statistic AS VS Percent: 0 %
Date Time Interrogation Session: 20210419203921
Implantable Lead Implant Date: 20070706
Implantable Lead Implant Date: 20070706
Implantable Lead Location: 753859
Implantable Lead Location: 753860
Implantable Lead Model: 5076
Implantable Lead Model: 5092
Implantable Pulse Generator Implant Date: 20170113
Lead Channel Impedance Value: 411 Ohm
Lead Channel Impedance Value: 684 Ohm
Lead Channel Pacing Threshold Amplitude: 0.875 V
Lead Channel Pacing Threshold Amplitude: 1.5 V
Lead Channel Pacing Threshold Pulse Width: 0.4 ms
Lead Channel Pacing Threshold Pulse Width: 0.4 ms
Lead Channel Setting Pacing Amplitude: 1.875
Lead Channel Setting Pacing Amplitude: 3 V
Lead Channel Setting Pacing Pulse Width: 0.4 ms
Lead Channel Setting Sensing Sensitivity: 2 mV

## 2019-07-30 NOTE — Progress Notes (Signed)
Ppm Remote  

## 2019-07-31 DIAGNOSIS — E1169 Type 2 diabetes mellitus with other specified complication: Secondary | ICD-10-CM | POA: Diagnosis not present

## 2019-07-31 DIAGNOSIS — Z7984 Long term (current) use of oral hypoglycemic drugs: Secondary | ICD-10-CM | POA: Diagnosis not present

## 2019-07-31 DIAGNOSIS — I10 Essential (primary) hypertension: Secondary | ICD-10-CM | POA: Diagnosis not present

## 2019-07-31 DIAGNOSIS — Z6827 Body mass index (BMI) 27.0-27.9, adult: Secondary | ICD-10-CM | POA: Diagnosis not present

## 2019-07-31 DIAGNOSIS — Z950 Presence of cardiac pacemaker: Secondary | ICD-10-CM | POA: Diagnosis not present

## 2019-07-31 DIAGNOSIS — F331 Major depressive disorder, recurrent, moderate: Secondary | ICD-10-CM | POA: Diagnosis not present

## 2019-07-31 DIAGNOSIS — T82858D Stenosis of vascular prosthetic devices, implants and grafts, subsequent encounter: Secondary | ICD-10-CM | POA: Diagnosis not present

## 2019-07-31 DIAGNOSIS — E1151 Type 2 diabetes mellitus with diabetic peripheral angiopathy without gangrene: Secondary | ICD-10-CM | POA: Diagnosis not present

## 2019-07-31 DIAGNOSIS — Z951 Presence of aortocoronary bypass graft: Secondary | ICD-10-CM | POA: Diagnosis not present

## 2019-08-06 DIAGNOSIS — F320 Major depressive disorder, single episode, mild: Secondary | ICD-10-CM | POA: Diagnosis not present

## 2019-08-06 DIAGNOSIS — E1151 Type 2 diabetes mellitus with diabetic peripheral angiopathy without gangrene: Secondary | ICD-10-CM | POA: Diagnosis not present

## 2019-08-06 DIAGNOSIS — E7800 Pure hypercholesterolemia, unspecified: Secondary | ICD-10-CM | POA: Diagnosis not present

## 2019-08-06 DIAGNOSIS — M160 Bilateral primary osteoarthritis of hip: Secondary | ICD-10-CM | POA: Diagnosis not present

## 2019-08-06 DIAGNOSIS — F330 Major depressive disorder, recurrent, mild: Secondary | ICD-10-CM | POA: Diagnosis not present

## 2019-08-06 DIAGNOSIS — I10 Essential (primary) hypertension: Secondary | ICD-10-CM | POA: Diagnosis not present

## 2019-08-06 DIAGNOSIS — N401 Enlarged prostate with lower urinary tract symptoms: Secondary | ICD-10-CM | POA: Diagnosis not present

## 2019-08-06 DIAGNOSIS — E1142 Type 2 diabetes mellitus with diabetic polyneuropathy: Secondary | ICD-10-CM | POA: Diagnosis not present

## 2019-08-06 DIAGNOSIS — M170 Bilateral primary osteoarthritis of knee: Secondary | ICD-10-CM | POA: Diagnosis not present

## 2019-08-08 ENCOUNTER — Telehealth: Payer: Self-pay | Admitting: Cardiovascular Disease

## 2019-08-08 NOTE — Telephone Encounter (Signed)
LMOM to call and schedule f/u visit... AF

## 2019-08-12 ENCOUNTER — Telehealth (INDEPENDENT_AMBULATORY_CARE_PROVIDER_SITE_OTHER): Payer: Medicare HMO | Admitting: Cardiology

## 2019-08-12 ENCOUNTER — Encounter: Payer: Self-pay | Admitting: Cardiology

## 2019-08-12 VITALS — BP 126/67 | Ht 68.0 in | Wt 171.0 lb

## 2019-08-12 DIAGNOSIS — Z9989 Dependence on other enabling machines and devices: Secondary | ICD-10-CM | POA: Diagnosis not present

## 2019-08-12 DIAGNOSIS — I480 Paroxysmal atrial fibrillation: Secondary | ICD-10-CM

## 2019-08-12 DIAGNOSIS — I10 Essential (primary) hypertension: Secondary | ICD-10-CM | POA: Diagnosis not present

## 2019-08-12 DIAGNOSIS — I495 Sick sinus syndrome: Secondary | ICD-10-CM

## 2019-08-12 DIAGNOSIS — G4733 Obstructive sleep apnea (adult) (pediatric): Secondary | ICD-10-CM

## 2019-08-12 DIAGNOSIS — I739 Peripheral vascular disease, unspecified: Secondary | ICD-10-CM

## 2019-08-12 DIAGNOSIS — E1169 Type 2 diabetes mellitus with other specified complication: Secondary | ICD-10-CM

## 2019-08-12 DIAGNOSIS — E785 Hyperlipidemia, unspecified: Secondary | ICD-10-CM

## 2019-08-12 DIAGNOSIS — Z7901 Long term (current) use of anticoagulants: Secondary | ICD-10-CM

## 2019-08-12 DIAGNOSIS — I25810 Atherosclerosis of coronary artery bypass graft(s) without angina pectoris: Secondary | ICD-10-CM

## 2019-08-12 NOTE — Patient Instructions (Signed)

## 2019-08-12 NOTE — Progress Notes (Signed)
Virtual Visit via Telephone Note   This visit type was conducted due to national recommendations for restrictions regarding the COVID-19 Pandemic (e.g. social distancing) in an effort to limit this patient's exposure and mitigate transmission in our community.  Due to his co-morbid illnesses, this patient is at least at moderate risk for complications without adequate follow up.  This format is felt to be most appropriate for this patient at this time.  The patient did not have access to video technology/had technical difficulties with video requiring transitioning to audio format only (telephone).  All issues noted in this document were discussed and addressed.  No physical exam could be performed with this format.  Please refer to the patient's chart for his  consent to telehealth for [facility].   The patient was identified using 2 identifiers.  Date:  08/12/2019   ID:  [name], DOB [birth year], MRN [id number]  Patient Location: Home Provider Location: Home  PCP:  [name], MD  Cardiologist:  Quay Burow, MD  Electrophysiologist:  Dr Sallyanne Kuster  Evaluation Performed:  Follow-Up Visit  Chief Complaint:  none  History of Present Illness:    Robert Burns is a 79 y.o. male with history of coronary disease, status post coronary artery bypass grafting in 2004.  He has subsequently had intervention in October 2019 in August 2020.  He saw Dr. Gwenlyn Found in November 2020.  He was doing well at that time as far as angina goes.  Other medical issues include hypertension, dyslipidemia, sleep apnea on CPAP, type 2 diabetes, sick sinus syndrome status post pacemaker in 2007 with a Medtronic generator change out in 2017, peripheral vascular disease, and recently diagnosed PAF.  The patient recently had PAF on a pacemaker check.  His rates were controlled.  He was initially placed on Eliquis but had side effects from this and it was changed to Xarelto which he says he is tolerating well.   Overall he feels like he is stable.  He says he does not have the energy that he feels like he should have.  He is on a high dose of beta-blocker and we discussed the possibility of trying to adjust his medications but he feels like he is overall stable and it has taken him a long time to get to his current medical regimen and for now we will stay the course.  The patient does not have symptoms concerning for COVID-19 infection (fever, chills, cough, or new shortness of breath).    Past Medical History:  Diagnosis Date  . Arthritis    "all over" (11/25/2015)  . CAD (coronary artery disease)    a. s/p CABG  06/2002; b. 02/01/15 PCI: DES to prox SVG to PDA, staged PCI of SVG to Diag in 02/2015; c. 04/2017 Cath/PCI: LM nl, LAD 100ost, 53m, 75d, LCX 60ost, OM2 80, RCA 100ost, RPDA 80, LIMA->LAD ok, VG->D1 patent stent, VG->RPDA patent stent, 50p, VG->OM1->OM2 90p (3.0x24 Synergy DES), 100 between OM1->OM2 (med rx).  . Cancer ([place])    Right Shoulder, Left Leg- BCC, SCC, AND MELANOMA  . Epithelioid hemangioendothelioma    s/p resection of left SFA/mass with interposition 6 mm GoreTex graft 06/02/10, s/p repeat resection for positive margins 09/22/10  . Hyperlipidemia   . Hypertension   . Leg pain   . OSA on CPAP   . PAD (peripheral artery disease) ([place])    a. 10/2002 L SFA PTA/BMS; b. 8/17 LE Angio: LEIA 90 (9x40 self exp stent), LSFA short segment  prox occlusion (staged PTA/stenting 01/03/2016), patent mid stent, RSFA 67m (staged PTA/DEB 02/14/2016).  . Presence of permanent cardiac pacemaker    Medtronic  . SSS (sick sinus syndrome) ([place])    a. s/p PPM in 2007 with gen change 04/2015 - Medtronic Adapta ADDRL1, ser # [id number].  . Type II diabetes mellitus ([place])    Type II  . Urgency of urination    Past Surgical History:  Procedure Laterality Date  . CARDIAC CATHETERIZATION N/A 02/01/2015   Procedure: Left Heart Cath and Coronary Angiography;  Surgeon: Lorretta Harp, MD;  Location: [place]  CV LAB;  Service: Cardiovascular;  Laterality: N/A;  . CARDIAC CATHETERIZATION N/A 02/01/2015   Procedure: Coronary Stent Intervention;  Surgeon: Lorretta Harp, MD;  Location: [place] CV [facility];  Service: Cardiovascular;  Laterality: N/A;  . CARDIAC CATHETERIZATION  06/2002   "just before bypass OR"  . CARDIAC CATHETERIZATION N/A 03/01/2015   Procedure: Coronary Stent Intervention;  Surgeon: Lorretta Harp, MD;  Location: [place] CV [facility];  Service: Cardiovascular;  Laterality: N/A;  . CARDIAC CATHETERIZATION  02/06/2018  . COLONOSCOPY    . CORONARY ANGIOPLASTY    . CORONARY ARTERY BYPASS GRAFT  06/2002   x5, LIMA-LAD;VG- Diag; seq VG- ramus & OM branch; VG-PDA  . CORONARY STENT INTERVENTION N/A 04/12/2017   Procedure: CORONARY STENT INTERVENTION;  Surgeon: Lorretta Harp, MD;  Location: [place] CV LAB;  Service: Cardiovascular;  Laterality: N/A;  . CORONARY STENT INTERVENTION N/A 02/08/2018   Procedure: CORONARY STENT INTERVENTION;  Surgeon: Jettie Booze, MD;  Location: [place] [facility] LAB;  Service: Cardiovascular;  Laterality: N/A;  . CORONARY STENT INTERVENTION N/A 11/12/2018   Procedure: CORONARY STENT INTERVENTION;  Surgeon: Wellington Hampshire, MD;  Location: [place] [facility];  Service: Cardiovascular;  Laterality: N/A;  . EP IMPLANTABLE DEVICE N/A 04/23/2015   Procedure: PPM Generator Changeout;  Surgeon: Sanda Klein, MD;  Location: [place] [facility];  Service: Cardiovascular;  Laterality: N/A;  . FALSE ANEURYSM REPAIR Left 11/29/2018   Procedure: REPAIR FALSE ANEURYSM LEFT RADIAL ARTERY;  Surgeon: [name], MD;  Location: [place];  Service: Vascular;  Laterality: Left;  . FEMORAL ARTERY STENT Left ~ 2014   "taken out of my leg; couldn' catorgorize what kind so the put it under all 3"; cataroziepitheloid hemanioendotheliomau  . FOOT FRACTURE SURGERY Left 1973  . FRACTURE SURGERY    . INSERT / REPLACE / REMOVE PACEMAKER  10/13/05   right side, medtronic Adapta  .  KNEE HARDWARE REMOVAL Right 1950's   "3-4 months after the insertion"  . KNEE SURGERY Right 1950's   "broke my lower leg; had to put pin in my knee to keep lower leg in place til it healed"  . LEFT HEART CATH AND CORS/GRAFTS ANGIOGRAPHY N/A 04/12/2017   Procedure: LEFT HEART CATH AND CORS/GRAFTS ANGIOGRAPHY;  Surgeon: Lorretta Harp, MD;  Location: [place] [facility];  Service: Cardiovascular;  Laterality: N/A;  . LEFT HEART CATH AND CORS/GRAFTS ANGIOGRAPHY N/A 02/06/2018   Procedure: LEFT HEART CATH AND CORS/GRAFTS ANGIOGRAPHY;  Surgeon: Troy Sine, MD;  Location: [place] CV LAB;  Service: Cardiovascular;  Laterality: N/A;  . LEFT HEART CATH AND CORS/GRAFTS ANGIOGRAPHY N/A 11/12/2018   Procedure: LEFT HEART CATH AND CORS/GRAFTS ANGIOGRAPHY;  Surgeon: Wellington Hampshire, MD;  Location: [place] [facility];  Service: Cardiovascular;  Laterality: N/A;  . PERIPHERAL VASCULAR CATHETERIZATION N/A 11/25/2015   Procedure: Lower Extremity Angiography;  Surgeon: Lorretta Harp, MD;  Location: [place] [facility];  Service: Cardiovascular;  Laterality: N/A;  . PERIPHERAL VASCULAR CATHETERIZATION Left 11/25/2015   Procedure: Peripheral Vascular Intervention;  Surgeon: Lorretta Harp, MD;  Location: [place] [facility];  Service: Cardiovascular;  Laterality: Left;  external iliac  . PERIPHERAL VASCULAR CATHETERIZATION N/A 01/03/2016   Procedure: Lower Extremity Angiography;  Surgeon: Lorretta Harp, MD;  Location: [place] [facility];  Service: Cardiovascular;  Laterality: N/A;  . PERIPHERAL VASCULAR CATHETERIZATION Left 01/03/2016   Procedure: Peripheral Vascular Intervention;  Surgeon: Lorretta Harp, MD;  Location: [place] [facility];  Service: Cardiovascular;  Laterality: Left;  SFA  . PERIPHERAL VASCULAR CATHETERIZATION Right 02/14/2016   Procedure: Peripheral Vascular Atherectomy;  Surgeon: Lorretta Harp, MD;  Location: [place] [facility];  Service: Cardiovascular;  Laterality: Right;  SFA  .  POPLITEAL ARTERY STENT  01/03/2016   Contralateral access with a 7 French crossover sheath (second order catheter placement)  . TONSILLECTOMY AND ADENOIDECTOMY    . TUMOR EXCISION Right ~ 2005   cancerous tumor removed from shoulder  . TUMOR EXCISION Right ~ 2000   benign tumor removed from under shoulder  . TUMOR EXCISION Left 06/02/2010   resection of Lt SFA wth interposition of Gore-Tex graft     Current Meds  Medication Sig  . amLODipine (NORVASC) 10 MG tablet Take 10 mg by mouth daily.   [name] atorvastatin (LIPITOR) 80 MG tablet Take 1 tablet (80 mg total) by mouth daily at 6 PM.  . Cholecalciferol (VITAMIN D3) 2000 units capsule Take 2,000 Units by mouth 2 (two) times daily.  [name] glipiZIDE (GLUCOTROL) 5 MG tablet Take 5 mg by mouth daily with breakfast.   . hydrALAZINE (APRESOLINE) 25 MG tablet Take 25 mg by mouth 2 (two) times daily.   . hydrochlorothiazide (HYDRODIURIL) 25 MG tablet Take 25 mg by mouth every morning.  . isosorbide mononitrate (IMDUR) 60 MG 24 hr tablet Take 1 tablet (60 mg total) by mouth daily.  . metFORMIN (GLUCOPHAGE) 1000 MG tablet Take 1 tablet (1,000 mg total) by mouth 2 (two) times daily with a meal.  . metoprolol tartrate (LOPRESSOR) 100 MG tablet Take 1 tablet (100 mg total) by mouth 2 (two) times daily.  . nitroGLYCERIN (NITROSTAT) 0.4 MG SL tablet Place 1 tablet (0.4 mg total) under the tongue every 5 (five) minutes as needed for chest pain.  . potassium chloride SA (K-DUR,KLOR-CON) 20 MEQ tablet Take 1 tablet (20 mEq total) by mouth daily.  . rivaroxaban (XARELTO) 20 MG TABS tablet Take 1 tablet (20 mg total) by mouth daily with supper.  [name] rOPINIRole (REQUIP) 1 MG tablet Take 1 mg by mouth daily.  . tamsulosin (FLOMAX) 0.4 MG CAPS capsule Take 0.4 mg by mouth 2 (two) times daily.   . [DISCONTINUED] HYDROcodone-acetaminophen (NORCO) 5-325 MG tablet Take 1 tablet by mouth every 4 (four) hours as needed for moderate pain.     Allergies:   Peanut-containing  drug products, Ace inhibitors, Diltiazem hcl, Meloxicam, Carvedilol, Clonidine hcl, and Duloxetine hcl   Social History   Tobacco Use  . Smoking status: Former Smoker    Years: 10.00    Types: Pipe    Quit date: 1973    Years since quitting: 48.3  . Smokeless tobacco: Never Used  . Tobacco comment: "quit smoking in 1973"  Substance Use Topics  . Alcohol use: No  . Drug use: No     Family Hx: The  patient's family history includes Coronary artery disease in his mother; Diabetes in his father and sister; Heart attack in his father and mother; Heart disease in his father, mother, and sister; Hyperlipidemia in his father; Hypertension in his mother and sister.  ROS:   Please see the history of present illness.    All other systems reviewed and are negative.   Prior CV studies:   The following studies were reviewed today: Cath/PCI- Aug 2020  Labs/Other Tests and Data Reviewed:    EKG:  An ECG dated 11/13/2018 was personally reviewed today and demonstrated:  Paced  Recent Labs: 11/11/2018: ALT 20 11/29/2018: BUN 22; Creatinine, Ser 0.96; Hemoglobin 12.6; Platelets 171; Potassium 3.9; Sodium 140   Recent Lipid Panel No results found for: CHOL, TRIG, HDL, CHOLHDL, LDLCALC, LDLDIRECT  Wt Readings from Last 3 Encounters:  08/12/19 171 lb (77.6 kg)  03/03/19 176 lb (79.8 kg)  02/21/19 176 lb (79.8 kg)     Objective:    Vital Signs:  BP 126/67   Ht 5\' 8"  (1.727 m)   Wt 171 lb (77.6 kg)   BMI 26.00 kg/m    VITAL SIGNS:  reviewed  ASSESSMENT & PLAN:    1. CAD s/p CABG: His last intervention was a PCI with DES to the SVG to diagonal and SVG to RCA.  The procedure was complicated by pseudoaneurysm, that was repaired by vascular surgery   Initially after cardiac catheterization he did have some chest pain, this has improved.  Continue current medical Rx.  2. Hypertension: Blood pressure stable on current therapy  3. Hyperlipidemia: Continue Lipitor 80 mg daily  4. DM2:  Managed by primary care provider  5. Obstructive sleep apnea: Continue CPAP therapy  6. History of pacemaker: Followed by Dr. Sallyanne Kuster  7. PAD: History of resection of hemangioendothelioma by Dr. Kellie Simmering in 2012.  8.  PAF- documented Nov 2020 on a pacemaker check.  Rates were controlled.  He was initially placed on Eliquis but couldn't tolerate that and is now on Xarelto which he tolerates well.   Plan: No change in Rx- f/u Dr Gwenlyn Found in 6 months.  If fatigue becomes an issue we could consider trying him on a lower dose on Metoprolol.    COVID-19 Education: The signs and symptoms of COVID-19 were discussed with the patient and how to seek care for testing (follow up with PCP or arrange E-visit).  The importance of social distancing was discussed today.  Time:   Today, I have spent 20 minutes with the patient with telehealth technology discussing the above problems.     Medication Adjustments/Labs and Tests Ordered: Current medicines are reviewed at length with the patient today.  Concerns regarding medicines are outlined above.   Tests Ordered: No orders of the defined types were placed in this encounter.   Medication Changes: No orders of the defined types were placed in this encounter.   Follow Up:  In Person Dr Gwenlyn Found in 6 months, Dr Sallyanne Kuster as scheduled.  Angelena Form, PA-C  08/12/2019 10:49 AM    [facility]

## 2019-10-07 DIAGNOSIS — H40033 Anatomical narrow angle, bilateral: Secondary | ICD-10-CM | POA: Diagnosis not present

## 2019-10-07 DIAGNOSIS — H35353 Cystoid macular degeneration, bilateral: Secondary | ICD-10-CM | POA: Diagnosis not present

## 2019-10-07 DIAGNOSIS — H3589 Other specified retinal disorders: Secondary | ICD-10-CM | POA: Diagnosis not present

## 2019-10-07 DIAGNOSIS — H34811 Central retinal vein occlusion, right eye, with macular edema: Secondary | ICD-10-CM | POA: Diagnosis not present

## 2019-10-07 DIAGNOSIS — H35372 Puckering of macula, left eye: Secondary | ICD-10-CM | POA: Diagnosis not present

## 2019-10-14 DIAGNOSIS — E1142 Type 2 diabetes mellitus with diabetic polyneuropathy: Secondary | ICD-10-CM | POA: Diagnosis not present

## 2019-10-14 DIAGNOSIS — E1151 Type 2 diabetes mellitus with diabetic peripheral angiopathy without gangrene: Secondary | ICD-10-CM | POA: Diagnosis not present

## 2019-10-14 DIAGNOSIS — I739 Peripheral vascular disease, unspecified: Secondary | ICD-10-CM | POA: Diagnosis not present

## 2019-10-14 DIAGNOSIS — N401 Enlarged prostate with lower urinary tract symptoms: Secondary | ICD-10-CM | POA: Diagnosis not present

## 2019-10-14 DIAGNOSIS — I10 Essential (primary) hypertension: Secondary | ICD-10-CM | POA: Diagnosis not present

## 2019-10-14 DIAGNOSIS — G4733 Obstructive sleep apnea (adult) (pediatric): Secondary | ICD-10-CM | POA: Diagnosis not present

## 2019-10-28 ENCOUNTER — Ambulatory Visit (INDEPENDENT_AMBULATORY_CARE_PROVIDER_SITE_OTHER): Payer: Medicare HMO | Admitting: *Deleted

## 2019-10-28 DIAGNOSIS — I495 Sick sinus syndrome: Secondary | ICD-10-CM | POA: Diagnosis not present

## 2019-11-01 LAB — CUP PACEART REMOTE DEVICE CHECK
Battery Impedance: 283 Ohm
Battery Remaining Longevity: 113 mo
Battery Voltage: 2.8 V
Brady Statistic AP VP Percent: 3 %
Brady Statistic AP VS Percent: 97 %
Brady Statistic AS VP Percent: 0 %
Brady Statistic AS VS Percent: 0 %
Date Time Interrogation Session: 20210723110740
Implantable Lead Implant Date: 20070706
Implantable Lead Implant Date: 20070706
Implantable Lead Location: 753859
Implantable Lead Location: 753860
Implantable Lead Model: 5076
Implantable Lead Model: 5092
Implantable Pulse Generator Implant Date: 20170113
Lead Channel Impedance Value: 412 Ohm
Lead Channel Impedance Value: 655 Ohm
Lead Channel Pacing Threshold Amplitude: 0.75 V
Lead Channel Pacing Threshold Amplitude: 1.5 V
Lead Channel Pacing Threshold Pulse Width: 0.4 ms
Lead Channel Pacing Threshold Pulse Width: 0.4 ms
Lead Channel Setting Pacing Amplitude: 1.875
Lead Channel Setting Pacing Amplitude: 3 V
Lead Channel Setting Pacing Pulse Width: 0.4 ms
Lead Channel Setting Sensing Sensitivity: 2 mV

## 2019-11-03 NOTE — Progress Notes (Signed)
Remote pacemaker transmission.   

## 2019-11-18 DIAGNOSIS — H34811 Central retinal vein occlusion, right eye, with macular edema: Secondary | ICD-10-CM | POA: Diagnosis not present

## 2019-11-18 DIAGNOSIS — H35372 Puckering of macula, left eye: Secondary | ICD-10-CM | POA: Diagnosis not present

## 2019-11-18 DIAGNOSIS — H35353 Cystoid macular degeneration, bilateral: Secondary | ICD-10-CM | POA: Diagnosis not present

## 2019-11-30 ENCOUNTER — Other Ambulatory Visit: Payer: Self-pay | Admitting: Cardiovascular Disease

## 2019-12-10 DIAGNOSIS — I2510 Atherosclerotic heart disease of native coronary artery without angina pectoris: Secondary | ICD-10-CM | POA: Diagnosis not present

## 2019-12-10 DIAGNOSIS — E119 Type 2 diabetes mellitus without complications: Secondary | ICD-10-CM | POA: Diagnosis not present

## 2019-12-10 DIAGNOSIS — Z91010 Allergy to peanuts: Secondary | ICD-10-CM | POA: Diagnosis not present

## 2019-12-10 DIAGNOSIS — S62613A Displaced fracture of proximal phalanx of left middle finger, initial encounter for closed fracture: Secondary | ICD-10-CM | POA: Diagnosis not present

## 2019-12-10 DIAGNOSIS — I10 Essential (primary) hypertension: Secondary | ICD-10-CM | POA: Diagnosis not present

## 2019-12-10 DIAGNOSIS — Z79899 Other long term (current) drug therapy: Secondary | ICD-10-CM | POA: Diagnosis not present

## 2019-12-10 DIAGNOSIS — S0990XA Unspecified injury of head, initial encounter: Secondary | ICD-10-CM | POA: Diagnosis not present

## 2019-12-10 DIAGNOSIS — S62643A Nondisplaced fracture of proximal phalanx of left middle finger, initial encounter for closed fracture: Secondary | ICD-10-CM | POA: Diagnosis not present

## 2019-12-10 DIAGNOSIS — S62635A Displaced fracture of distal phalanx of left ring finger, initial encounter for closed fracture: Secondary | ICD-10-CM | POA: Diagnosis not present

## 2019-12-10 DIAGNOSIS — Z888 Allergy status to other drugs, medicaments and biological substances status: Secondary | ICD-10-CM | POA: Diagnosis not present

## 2019-12-10 DIAGNOSIS — Z87891 Personal history of nicotine dependence: Secondary | ICD-10-CM | POA: Diagnosis not present

## 2019-12-12 DIAGNOSIS — S62635A Displaced fracture of distal phalanx of left ring finger, initial encounter for closed fracture: Secondary | ICD-10-CM | POA: Diagnosis not present

## 2019-12-12 DIAGNOSIS — M79642 Pain in left hand: Secondary | ICD-10-CM | POA: Diagnosis not present

## 2019-12-12 DIAGNOSIS — S62613A Displaced fracture of proximal phalanx of left middle finger, initial encounter for closed fracture: Secondary | ICD-10-CM | POA: Diagnosis not present

## 2019-12-23 DIAGNOSIS — F330 Major depressive disorder, recurrent, mild: Secondary | ICD-10-CM | POA: Diagnosis not present

## 2019-12-23 DIAGNOSIS — N401 Enlarged prostate with lower urinary tract symptoms: Secondary | ICD-10-CM | POA: Diagnosis not present

## 2019-12-23 DIAGNOSIS — E1142 Type 2 diabetes mellitus with diabetic polyneuropathy: Secondary | ICD-10-CM | POA: Diagnosis not present

## 2019-12-23 DIAGNOSIS — E7800 Pure hypercholesterolemia, unspecified: Secondary | ICD-10-CM | POA: Diagnosis not present

## 2019-12-23 DIAGNOSIS — I10 Essential (primary) hypertension: Secondary | ICD-10-CM | POA: Diagnosis not present

## 2019-12-23 DIAGNOSIS — I25709 Atherosclerosis of coronary artery bypass graft(s), unspecified, with unspecified angina pectoris: Secondary | ICD-10-CM | POA: Diagnosis not present

## 2019-12-23 DIAGNOSIS — E1151 Type 2 diabetes mellitus with diabetic peripheral angiopathy without gangrene: Secondary | ICD-10-CM | POA: Diagnosis not present

## 2019-12-23 DIAGNOSIS — M160 Bilateral primary osteoarthritis of hip: Secondary | ICD-10-CM | POA: Diagnosis not present

## 2019-12-23 DIAGNOSIS — M170 Bilateral primary osteoarthritis of knee: Secondary | ICD-10-CM | POA: Diagnosis not present

## 2019-12-24 ENCOUNTER — Encounter (HOSPITAL_COMMUNITY): Payer: Medicare HMO

## 2019-12-29 DIAGNOSIS — S62613D Displaced fracture of proximal phalanx of left middle finger, subsequent encounter for fracture with routine healing: Secondary | ICD-10-CM | POA: Diagnosis not present

## 2019-12-29 DIAGNOSIS — S62613A Displaced fracture of proximal phalanx of left middle finger, initial encounter for closed fracture: Secondary | ICD-10-CM | POA: Diagnosis not present

## 2019-12-29 DIAGNOSIS — S62635A Displaced fracture of distal phalanx of left ring finger, initial encounter for closed fracture: Secondary | ICD-10-CM | POA: Diagnosis not present

## 2019-12-30 ENCOUNTER — Other Ambulatory Visit: Payer: Self-pay

## 2019-12-30 ENCOUNTER — Telehealth: Payer: Self-pay

## 2019-12-30 ENCOUNTER — Ambulatory Visit (HOSPITAL_BASED_OUTPATIENT_CLINIC_OR_DEPARTMENT_OTHER)
Admission: RE | Admit: 2019-12-30 | Discharge: 2019-12-30 | Disposition: A | Discharge status: Home / Self Care | Payer: Medicare HMO | Source: Ambulatory Visit | Attending: Cardiovascular Disease | Admitting: Cardiovascular Disease

## 2019-12-30 ENCOUNTER — Ambulatory Visit (HOSPITAL_COMMUNITY)
Admission: RE | Admit: 2019-12-30 | Discharge: 2019-12-30 | Disposition: A | Discharge status: Home / Self Care | Payer: Medicare HMO | Source: Ambulatory Visit | Attending: Cardiovascular Disease | Admitting: Cardiovascular Disease

## 2019-12-30 ENCOUNTER — Other Ambulatory Visit (HOSPITAL_COMMUNITY): Payer: Self-pay | Admitting: Cardiovascular Disease

## 2019-12-30 DIAGNOSIS — Z95828 Presence of other vascular implants and grafts: Secondary | ICD-10-CM

## 2019-12-30 DIAGNOSIS — I739 Peripheral vascular disease, unspecified: Secondary | ICD-10-CM

## 2019-12-30 DIAGNOSIS — Z9862 Peripheral vascular angioplasty status: Secondary | ICD-10-CM | POA: Insufficient documentation

## 2019-12-30 NOTE — Telephone Encounter (Signed)
Samples of Xarelto 20mg  were given to the patient, quantity 2 bottles, Lot Number [id number] Exp: 02-23

## 2020-01-09 ENCOUNTER — Encounter: Payer: Self-pay | Admitting: Cardiovascular Disease

## 2020-01-09 ENCOUNTER — Other Ambulatory Visit: Payer: Self-pay

## 2020-01-09 ENCOUNTER — Ambulatory Visit (INDEPENDENT_AMBULATORY_CARE_PROVIDER_SITE_OTHER): Payer: Medicare HMO | Admitting: Cardiovascular Disease

## 2020-01-09 DIAGNOSIS — Z9989 Dependence on other enabling machines and devices: Secondary | ICD-10-CM

## 2020-01-09 DIAGNOSIS — I25810 Atherosclerosis of coronary artery bypass graft(s) without angina pectoris: Secondary | ICD-10-CM

## 2020-01-09 DIAGNOSIS — I495 Sick sinus syndrome: Secondary | ICD-10-CM

## 2020-01-09 DIAGNOSIS — I10 Essential (primary) hypertension: Secondary | ICD-10-CM | POA: Diagnosis not present

## 2020-01-09 DIAGNOSIS — E1169 Type 2 diabetes mellitus with other specified complication: Secondary | ICD-10-CM | POA: Diagnosis not present

## 2020-01-09 DIAGNOSIS — G4733 Obstructive sleep apnea (adult) (pediatric): Secondary | ICD-10-CM

## 2020-01-09 DIAGNOSIS — I739 Peripheral vascular disease, unspecified: Secondary | ICD-10-CM | POA: Diagnosis not present

## 2020-01-09 DIAGNOSIS — I480 Paroxysmal atrial fibrillation: Secondary | ICD-10-CM

## 2020-01-09 DIAGNOSIS — E785 Hyperlipidemia, unspecified: Secondary | ICD-10-CM

## 2020-01-09 NOTE — Progress Notes (Signed)
01/09/2020 Robert Burns   September 26, 1940  [id number]  Primary Physician [name], MD Primary Cardiologist: Lorretta Harp MD Robert Burns, [name]  HPI:  Robert Burns is a 79 y.o.  married Caucasian male, father of 2, grandfather to 3 grandchildren whose wife [name] who is also a patient of mine. I last saw him in theoffice 02/21/2019.Robert Burns He is accompanied by one of his daughters [name] today.  He has a history of CAD status post coronary artery bypass grafting March 2004 with a LIMA to his LAD, a vein to a diagonal branch, a sequential vein to a ramus and OM branch, as well as a vein to the PDA. Last functional study performed July 28, 2011, was entirely normal. 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 He had a pacemaker placed for sick sinus syndrome November 2008 followed by Dr. Sallyanne Kuster. He has obstructive sleep apnea on CPAP. He complain of left thigh pain and I angiogram'd him revealing patent arteries though he did have a space-occupying lesion which was removed by Dr. Kellie Simmering with placement of an interposition 6-mm Gore-Tex graft. The pathology was uncertain. 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 Dr. Baxter Flattery follows his lipid profile.Since I saw him back 11/07/12 he has done well. Followup Dopplers performed in our office 09/27/12 revealed a high-grade lesion in the distal right SFA with an ABI of 0.3. His left ABI 1.1 without obstructive disease. His most recent lower extremity Doppler Dopplers performed 9/32/16 revealed a right ABI 0.82 And a left ABI of 0.89. Over the last 3 months he's noticed anginal chest pain occurring both at rest and with exertion with left upper extremity radiation. He also complains of left lower extremity discomfort. to moderate anterolateral ischemia. Because of ongoing chest pain and a Myoview that showed anterolateral ischemia and he underwent cardiac catheterization on 02/01/15 revealing high grade segmental  proximal right SVG disease and subtotally occluded diagonal branch SVG. He underwent stenting of his RCA SVG successfully. He does have continued chest pain although somewhat improved since his last procedure. I brought him back for staged diagonal branch SVG intervention on 03/01/15. This was successful and since that time he denies chest pain or shortness of breath. He underwent a generator change by Dr. Sallyanne Kuster in January for end-of-life of his Medtronic pacemaker.Since I saw him 6 months ago he developed new left calf claudication of the last 3 months. Lower extremity arterial Doppler studies performed today revealed a decline in his left ABI from 0.87 down to 0.59 with an occluded left SFA that is a new finding. 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 He had a short segment occlusion of the proximal left SFA with patent mid left SFA stent. He had 90% segmental calcified mid right SFA stenosis with three-vessel runoff bilaterally. I ended up stenting his left external iliac artery with a 9 mm x 40 mm long nitinol self-expanding stent which improved his symptoms somewhat although he continuedto have lifestyle limiting claudication. He underwent staged intervention of his left SFA on 01/03/16. I stented the entire quadrant totally occluded segment with a 6 mm x 250 mm long Viabahn Stent. His claudication on the left has resolved and his ABIs normalized. He now complains of right leg medication. I did demonstrate a 90% calcified segmental mid right SFA stenosis at the  time of angiography 11/25/15. He underwent diamondback orbital rotation atherectomy/drug-eluting angioplasty of his mid calcified right SFA by myself 02/14/16. This follow-up Dopplers performed 02/24/16 revealed normal ABIs bilaterally. His claudication has improved. When I saw him in the  office possibly 3 weeks ago he was complaining of fairly new onset substernal chest pain over the prior several months occurring several times a week. These were new since his RCA and diagonal branch vein graft interventions at the end of 2016. He is on good antianginal medications. A Myoview stress test was ordered that showed subtle inferolateral ischemia and echo showed normal LV function.Robert Burns  He was admitted to the hospital 02/06/2018 for 3 days with unstable angina. He underwent catheterization by Dr. Claiborne Billings on 02/06/2018 revealing disease in all 3 vein grafts as well as in the LAD beyond LIMA insertion, 2 days later he underwent complex intervention on his diagonal and ramus branch vein grafts with restenting as well as intervention on his LAD via LIMA insertion. His RCA vein graft was not intervened on. He currently denies angina or claudication.   He was admitted to the hospital with unstable angina on 11/11/2018. Enzymes were negative. He underwent catheterization and intervention by Dr. Fletcher Anon 11/12/2018 via the left radial approach with stenting of his proximal diagonal branch vein graft in his distal PDA vein graft. Since discharge, his anginal symptoms have significantly improved although he still has a "heaviness feeling" in his chest. 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  This was subsequently surgically addressed by Dr. Donnetta Hutching.  Since I saw him in the office 1 year ago he continues to do well.  He did fall down and injured his left finger and wrist and currently is in a cast.  This was a mechanical fall.  Since I saw him Dr. Sallyanne Kuster did identify significant A. fib burden on remote interrogation of his pacemaker and he was subsequent begun on Eliquis which was transitioned to Xarelto.  He gets occasional chest pain not requiring sublingual nitroglycerin.  His most recent lower extremity arterial Doppler studies revealed patent left iliac and SFAs bilaterally.   Carotid Dopplers performed last December showed moderate right ICA stenosis.    Current Meds  Medication Sig  . amLODipine (NORVASC) 10 MG tablet Take 10 mg by mouth daily.   Robert Burns atorvastatin (LIPITOR) 80 MG tablet Take 1 tablet (80 mg total) by mouth daily at 6 PM.  . Cholecalciferol (VITAMIN D3) 2000 units capsule Take 2,000 Units by mouth 2 (two) times daily.  Robert Burns glipiZIDE (GLUCOTROL) 5 MG tablet Take 5 mg by mouth daily with breakfast.   . hydrALAZINE (APRESOLINE) 25 MG tablet Take 25 mg by mouth 2 (two) times daily.   . hydrochlorothiazide (HYDRODIURIL) 25 MG tablet Take 25 mg by mouth every morning.  . isosorbide mononitrate (IMDUR) 60 MG 24 hr tablet TAKE 1 TABLET EVERY DAY  . melatonin 5 MG TABS 1 tablet in the evening  . metFORMIN (GLUCOPHAGE) 1000 MG tablet Take 1 tablet (1,000 mg total) by mouth 2 (two) times daily with a meal.  . metoprolol tartrate (LOPRESSOR) 100 MG tablet Take 1 tablet (100 mg total) by mouth 2 (two) times daily.  . MULTIPLE VITAMIN-FOLIC ACID PO 1 tablet  . nitroGLYCERIN (NITROSTAT) 0.4 MG SL tablet Place 1 tablet (0.4 mg total) under the tongue every 5 (five) minutes as needed for chest pain.  . potassium chloride SA (K-DUR,KLOR-CON) 20 MEQ tablet Take 1 tablet (20 mEq  total) by mouth daily.  . rivaroxaban (XARELTO) 20 MG TABS tablet Take 1 tablet (20 mg total) by mouth daily with supper.  Robert Burns rOPINIRole (REQUIP) 1 MG tablet Take 1 mg by mouth daily.  . tamsulosin (FLOMAX) 0.4 MG CAPS capsule Take 0.4 mg by mouth 2 (two) times daily.      Allergies  Allergen Reactions  . Peanut-Containing Drug Products Anaphylaxis and Other (See Comments)    Tongue swelling is severe   . Ace Inhibitors Hives, Rash and Cough       . Diltiazem Hcl Other (See Comments)    UNSPECIFIED REACTION    . Meloxicam Other (See Comments)    GI upset GI BLEED   . Eliquis [Apixaban] Other (See Comments)    dizzyness  . Carvedilol Itching  . Clonidine Hcl Other (See Comments)     Patch only - skin irritation  . Duloxetine Hcl Other (See Comments)    Urinary frequency    Social History   Socioeconomic History  . Marital status: Married    Spouse name: Not on file  . Number of children: Not on file  . Years of education: Not on file  . Highest education level: Not on file  Occupational History  . Not on file  Tobacco Use  . Smoking status: Former Smoker    Years: 10.00    Types: Pipe    Quit date: 1973    Years since quitting: 48.7  . Smokeless tobacco: Never Used  . Tobacco comment: "quit smoking in 1973"  Vaping Use  . Vaping Use: Never used  Substance and Sexual Activity  . Alcohol use: No  . Drug use: No  . Sexual activity: Not Currently  Other Topics Concern  . Not on file  Social History Narrative  . Not on file   Social Determinants of Health   Financial Resource Strain:   . Difficulty of Paying Living Expenses: Not on file  Food Insecurity:   . Worried About Charity fundraiser in the Last Year: Not on file  . Ran Out of Food in the Last Year: Not on file  Transportation Needs:   . Lack of Transportation (Medical): Not on file  . Lack of Transportation (Non-Medical): Not on file  Physical Activity:   . Days of Exercise per Week: Not on file  . Minutes of Exercise per Session: Not on file  Stress:   . Feeling of Stress : Not on file  Social Connections:   . Frequency of Communication with Friends and Family: Not on file  . Frequency of Social Gatherings with Friends and Family: Not on file  . Attends Religious Services: Not on file  . Active Member of Clubs or Organizations: Not on file  . Attends Archivist Meetings: Not on file  . Marital Status: Not on file  Intimate Partner Violence:   . Fear of Current or Ex-Partner: Not on file  . Emotionally Abused: Not on file  . Physically Abused: Not on file  . Sexually Abused: Not on file     Review of Systems: General: negative for chills, fever, night sweats or  weight changes.  Cardiovascular: negative for chest pain, dyspnea on exertion, edema, orthopnea, palpitations, paroxysmal nocturnal dyspnea or shortness of breath Dermatological: negative for rash Respiratory: negative for cough or wheezing Urologic: negative for hematuria Abdominal: negative for nausea, vomiting, diarrhea, bright red blood per rectum, melena, or hematemesis Neurologic: negative for visual changes, syncope, or dizziness All other  systems reviewed and are otherwise negative except as noted above.    Blood pressure 121/66, pulse 83, height 5\' 7"  (1.702 m), weight 178 lb (80.7 kg), SpO2 97 %.  General appearance: alert and no distress Neck: no adenopathy, no carotid bruit, no JVD, supple, symmetrical, trachea midline and thyroid not enlarged, symmetric, no tenderness/mass/nodules Lungs: clear to auscultation bilaterally Heart: regular rate and rhythm, S1, S2 normal, no murmur, click, rub or gallop Extremities: extremities normal, atraumatic, no cyanosis or edema Pulses: 2+ and symmetric Skin: Skin color, texture, turgor normal. No rashes or lesions Neurologic: Alert and oriented X 3, normal strength and tone. Normal symmetric reflexes. Normal coordination and gait  EKG atrially paced rhythm at 83 with septal Q waves.  I personally reviewed this EKG.  ASSESSMENT AND PLAN:   Essential hypertension History of essential hypertension blood pressure measured today 121/66.  He is on amlodipine, hydralazine, hydrochlorothiazide and metoprolol.  SSS (sick sinus syndrome), medtronic adapta History of sick sinus syndrome status post permanent transvenous pacemaker insertion remotely.  He had a generator change by Dr. Sallyanne Kuster of his Medtronic pacemaker.  This is followed quarterly by [facility] and annually in person.  Apparently he had significant A. fib burden when his pacemaker was interrogated a year ago and he was subsequent begun on Eliquis which she did not tolerate and changed  to Xarelto.  Hyperlipidemia due to type 2 diabetes mellitus ([place]) History of hyperlipidemia on statin therapy with lipid profile performed 06/19/2019 revealing total cholesterol 78, LDL 31 and HDL 28.  OSA on CPAP History of obstructive sleep apnea on CPAP  Peripheral arterial disease ([place]) History of PAD status post left external iliac, left SFA and right SFA intervention.  He did at 1 point have a Gore-Tex interposition graft placed by Dr. Kellie Simmering because of external compression of his SFA by a thigh mass which was resected.  He currently denies claudication.  Dopplers performed 12/30/2019 revealed a patent iliac stent with a right ABI of 0.78, left ABI 1.05 with patent SFAs bilaterally.  We will repeat Doppler studies in 1 year.  CAD (coronary artery disease) of bypass graft History of CAD status post remote CABG March 2004 with a LIMA to his LAD, vein to diagonal branch, sequential vein to ramus and OM branch and a vein to the PDA.  He has had multiple interventions on his vein grafts in the past by myself, Dr. Claiborne Billings, Dr. Irish Lack and most recently by Dr. Fletcher Anon via the left radial approach 11/11/2018.  He ultimately developed a left radial aneurysm which was surgically addressed by Dr. Donnetta Hutching.  He has minimal angina on a good antianginal regimen.  Paroxysmal atrial fibrillation (HCC) History of PAF discovered on remote pacer interrogation reviewed by Dr. Robb Matar because of A. fib burden was begun on Eliquis which was ultimately changed to Xarelto.      Lorretta Harp MD FACP,FACC,FAHA, [facility] 01/09/2020 9:50 AM

## 2020-01-09 NOTE — Assessment & Plan Note (Signed)
History of CAD status post remote CABG March 2004 with a LIMA to his LAD, vein to diagonal branch, sequential vein to ramus and OM branch and a vein to the PDA.  He has had multiple interventions on his vein grafts in the past by myself, Dr. Claiborne Billings, Dr. Irish Lack and most recently by Dr. Fletcher Anon via the left radial approach 11/11/2018.  He ultimately developed a left radial aneurysm which was surgically addressed by Dr. Donnetta Hutching.  He has minimal angina on a good antianginal regimen.

## 2020-01-09 NOTE — Assessment & Plan Note (Signed)
History of PAD status post left external iliac, left SFA and right SFA intervention.  He did at 1 point have a Gore-Tex interposition graft placed by Dr. Kellie Simmering because of external compression of his SFA by a thigh mass which was resected.  He currently denies claudication.  Dopplers performed 12/30/2019 revealed a patent iliac stent with a right ABI of 0.78, left ABI 1.05 with patent SFAs bilaterally.  We will repeat Doppler studies in 1 year.

## 2020-01-09 NOTE — Assessment & Plan Note (Signed)
History of essential hypertension blood pressure measured today 121/66.  He is on amlodipine, hydralazine, hydrochlorothiazide and metoprolol.

## 2020-01-09 NOTE — Patient Instructions (Addendum)
Medication Instructions:  Your physician recommends that you continue on your current medications as directed. Please refer to the Current Medication list given to you today.  *If you need a refill on your cardiac medications before your next appointment, please call your pharmacy*   Lab Work: None If you have labs (blood work) drawn today and your tests are completely normal, you will receive your results only by: [name] MyChart Message (if you have MyChart) OR . A paper copy in the mail If you have any lab test that is abnormal or we need to change your treatment, we will call you to review the results.   Testing/Procedures: Your physician has requested that you have a carotid duplex. This test is an ultrasound of the carotid arteries in your neck. It looks at blood flow through these arteries that supply the brain with blood. Allow one hour for this exam. There are no restrictions or special instructions.     Follow-Up:   Your physician recommends that you keep your scheduled follow-up appointment with  Dr. Sallyanne Kuster.   At [facility], you and your health needs are our priority.  As part of our continuing mission to provide you with exceptional heart care, we have created designated Provider Care Teams.  These Care Teams include your primary Cardiologist (physician) and Advanced Practice Providers (APPs -  Physician Assistants and Nurse Practitioners) who all work together to provide you with the care you need, when you need it.  We recommend signing up for the patient portal called "MyChart".  Sign up information is provided on this After Visit Summary.  MyChart is used to connect with patients for Virtual Visits (Telemedicine).  Patients are able to view lab/test results, encounter notes, upcoming appointments, etc.  Non-urgent messages can be sent to your provider as well.   To learn more about what you can do with MyChart, go to [email or web address].    Your next appointment:    6 month(s)  The format for your next appointment:   In Person  Provider:   You will see one of the following Advanced Practice Providers on your designated Care Team:    Kerin Ransom, [place]      Other Instructions Your physician wants you to follow-up in: 10 months for Lower extremity dopplers with a follow up with Dr. Gwenlyn Found in one year.  You will receive a reminder letter in the mail two months in advance. If you don't receive a letter, please call our office to schedule the follow-up appointment.

## 2020-01-09 NOTE — Assessment & Plan Note (Signed)
History of obstructive sleep apnea on CPAP. 

## 2020-01-09 NOTE — Assessment & Plan Note (Signed)
History of sick sinus syndrome status post permanent transvenous pacemaker insertion remotely.  He had a generator change by Dr. Sallyanne Kuster of his Medtronic pacemaker.  This is followed quarterly by [facility] and annually in person.  Apparently he had significant A. fib burden when his pacemaker was interrogated a year ago and he was subsequent begun on Eliquis which she did not tolerate and changed to Xarelto.

## 2020-01-09 NOTE — Assessment & Plan Note (Signed)
History of hyperlipidemia on statin therapy with lipid profile performed 06/19/2019 revealing total cholesterol 78, LDL 31 and HDL 28.

## 2020-01-09 NOTE — Assessment & Plan Note (Signed)
History of PAF discovered on remote pacer interrogation reviewed by Dr. Robb Matar because of A. fib burden was begun on Eliquis which was ultimately changed to Xarelto.

## 2020-01-16 DIAGNOSIS — S62635D Displaced fracture of distal phalanx of left ring finger, subsequent encounter for fracture with routine healing: Secondary | ICD-10-CM | POA: Diagnosis not present

## 2020-01-16 DIAGNOSIS — S62635A Displaced fracture of distal phalanx of left ring finger, initial encounter for closed fracture: Secondary | ICD-10-CM | POA: Diagnosis not present

## 2020-01-16 DIAGNOSIS — S62613D Displaced fracture of proximal phalanx of left middle finger, subsequent encounter for fracture with routine healing: Secondary | ICD-10-CM | POA: Diagnosis not present

## 2020-01-16 DIAGNOSIS — S62613A Displaced fracture of proximal phalanx of left middle finger, initial encounter for closed fracture: Secondary | ICD-10-CM | POA: Diagnosis not present

## 2020-01-16 DIAGNOSIS — S62615D Displaced fracture of proximal phalanx of left ring finger, subsequent encounter for fracture with routine healing: Secondary | ICD-10-CM | POA: Diagnosis not present

## 2020-01-26 DIAGNOSIS — M79642 Pain in left hand: Secondary | ICD-10-CM | POA: Diagnosis not present

## 2020-01-26 DIAGNOSIS — R29898 Other symptoms and signs involving the musculoskeletal system: Secondary | ICD-10-CM | POA: Diagnosis not present

## 2020-01-26 DIAGNOSIS — R278 Other lack of coordination: Secondary | ICD-10-CM | POA: Diagnosis not present

## 2020-01-26 DIAGNOSIS — S62635D Displaced fracture of distal phalanx of left ring finger, subsequent encounter for fracture with routine healing: Secondary | ICD-10-CM | POA: Diagnosis not present

## 2020-01-26 DIAGNOSIS — S62613D Displaced fracture of proximal phalanx of left middle finger, subsequent encounter for fracture with routine healing: Secondary | ICD-10-CM | POA: Diagnosis not present

## 2020-01-26 DIAGNOSIS — M25642 Stiffness of left hand, not elsewhere classified: Secondary | ICD-10-CM | POA: Diagnosis not present

## 2020-01-26 DIAGNOSIS — M6281 Muscle weakness (generalized): Secondary | ICD-10-CM | POA: Diagnosis not present

## 2020-01-27 ENCOUNTER — Ambulatory Visit (INDEPENDENT_AMBULATORY_CARE_PROVIDER_SITE_OTHER): Payer: Medicare HMO

## 2020-01-27 DIAGNOSIS — H40033 Anatomical narrow angle, bilateral: Secondary | ICD-10-CM | POA: Diagnosis not present

## 2020-01-27 DIAGNOSIS — I495 Sick sinus syndrome: Secondary | ICD-10-CM | POA: Diagnosis not present

## 2020-01-27 DIAGNOSIS — H2513 Age-related nuclear cataract, bilateral: Secondary | ICD-10-CM | POA: Diagnosis not present

## 2020-01-27 DIAGNOSIS — H35372 Puckering of macula, left eye: Secondary | ICD-10-CM | POA: Diagnosis not present

## 2020-01-27 DIAGNOSIS — H34811 Central retinal vein occlusion, right eye, with macular edema: Secondary | ICD-10-CM | POA: Diagnosis not present

## 2020-01-28 LAB — CUP PACEART REMOTE DEVICE CHECK
Battery Impedance: 308 Ohm
Battery Remaining Longevity: 110 mo
Battery Voltage: 2.8 V
Brady Statistic AP VP Percent: 3 %
Brady Statistic AP VS Percent: 97 %
Brady Statistic AS VP Percent: 0 %
Brady Statistic AS VS Percent: 0 %
Date Time Interrogation Session: 20211020153743
Implantable Lead Implant Date: 20070706
Implantable Lead Implant Date: 20070706
Implantable Lead Location: 753859
Implantable Lead Location: 753860
Implantable Lead Model: 5076
Implantable Lead Model: 5092
Implantable Pulse Generator Implant Date: 20170113
Lead Channel Impedance Value: 418 Ohm
Lead Channel Impedance Value: 698 Ohm
Lead Channel Pacing Threshold Amplitude: 0.875 V
Lead Channel Pacing Threshold Amplitude: 1.5 V
Lead Channel Pacing Threshold Pulse Width: 0.4 ms
Lead Channel Pacing Threshold Pulse Width: 0.4 ms
Lead Channel Setting Pacing Amplitude: 1.75 V
Lead Channel Setting Pacing Amplitude: 3 V
Lead Channel Setting Pacing Pulse Width: 0.4 ms
Lead Channel Setting Sensing Sensitivity: 5.6 mV

## 2020-02-01 ENCOUNTER — Emergency Department
Admission: EM | Admit: 2020-02-01 | Discharge: 2020-02-01 | Disposition: A | Discharge status: Home / Self Care | Payer: Medicare HMO | Source: Home / Self Care | Attending: Family Medicine | Admitting: Family Medicine

## 2020-02-01 ENCOUNTER — Other Ambulatory Visit: Payer: Self-pay

## 2020-02-01 DIAGNOSIS — H6123 Impacted cerumen, bilateral: Secondary | ICD-10-CM | POA: Diagnosis not present

## 2020-02-01 DIAGNOSIS — H9201 Otalgia, right ear: Secondary | ICD-10-CM

## 2020-02-01 MED ORDER — NEOMYCIN-POLYMYXIN-HC 3.5-10000-1 OT SUSP: 3.0000 [drp] | Freq: Three times a day (TID) | OTIC | 0 refills | Status: DC

## 2020-02-01 MED ORDER — AMOXICILLIN 875 MG PO TABS: ORAL_TABLET | ORAL | 0 refills | Status: DC

## 2020-02-01 NOTE — ED Provider Notes (Signed)
[facility]    CSN: [id number] Arrival date & time: 02/01/20  1441      History   Chief Complaint Chief Complaint  Patient presents with  . Otalgia    HPI Robert Burns is a 79 y.o. male.   Patient complains that his right ear has felt clogged for about 6 days.  During the past two days he has developed pain in the right ear.  He denies fevers, chills, and sweats, nasal congestion, and recent URI.  The history is provided by the patient.    Past Medical History:  Diagnosis Date  . Arthritis    "all over" (11/25/2015)  . CAD (coronary artery disease)    a. s/p CABG  06/2002; b. 02/01/15 PCI: DES to prox SVG to PDA, staged PCI of SVG to Diag in 02/2015; c. 04/2017 Cath/PCI: LM nl, LAD 100ost, 40m, 75d, LCX 60ost, OM2 80, RCA 100ost, RPDA 80, LIMA->LAD ok, VG->D1 patent stent, VG->RPDA patent stent, 50p, VG->OM1->OM2 90p (3.0x24 Synergy DES), 100 between OM1->OM2 (med rx).  . Cancer ([place])    Right Shoulder, Left Leg- BCC, SCC, AND MELANOMA  . Epithelioid hemangioendothelioma    s/p resection of left SFA/mass with interposition 6 mm GoreTex graft 06/02/10, s/p repeat resection for positive margins 09/22/10  . Hyperlipidemia   . Hypertension   . Leg pain   . OSA on CPAP   . PAD (peripheral artery disease) ([place])    a. 10/2002 L SFA PTA/BMS; b. 8/17 LE Angio: LEIA 90 (9x40 self exp stent), LSFA short segment prox occlusion (staged PTA/stenting 01/03/2016), patent mid stent, RSFA 22m (staged PTA/DEB 02/14/2016).  . Presence of permanent cardiac pacemaker    Medtronic  . SSS (sick sinus syndrome) ([place])    a. s/p PPM in 2007 with gen change 04/2015 - Medtronic Adapta ADDRL1, ser # [id number].  . Type II diabetes mellitus ([place])    Type II  . Urgency of urination     Patient Active Problem List   Diagnosis Date Noted  . Chronic anticoagulation 08/12/2019  . Dyslipidemia (high LDL; low HDL) 03/05/2019  . Unstable angina ([place]) 11/11/2018  . Hypercholesterolemia  02/20/2018  . Paroxysmal atrial fibrillation ([place]) 02/20/2018  . CAD (coronary artery disease) of bypass graft 02/06/2018  . CAD, multiple vessel 02/06/2018  . Leukocytosis 02/16/2016  . Peripheral arterial disease ([place])   . Pacemaker 04/23/2015  . Positive cardiac stress test   . OSA on CPAP 11/04/2012  . SSS (sick sinus syndrome), medtronic adapta   . Hyperlipidemia due to type 2 diabetes mellitus ([place])   . Superficial femoral artery injury 05/09/2011  . SPRAIN&STRAIN OTHER SPECIFIED SITES KNEE&LEG 01/31/2010  . Type 2 diabetes mellitus with complication, without long-term current use of insulin ([place]) 11/26/2006  . Essential hypertension 11/26/2006    Past Surgical History:  Procedure Laterality Date  . CARDIAC CATHETERIZATION N/A 02/01/2015   Procedure: Left Heart Cath and Coronary Angiography;  Surgeon: Lorretta Harp, MD;  Location: [place] [facility];  Service: Cardiovascular;  Laterality: N/A;  . CARDIAC CATHETERIZATION N/A 02/01/2015   Procedure: Coronary Stent Intervention;  Surgeon: Lorretta Harp, MD;  Location: [place] [facility];  Service: Cardiovascular;  Laterality: N/A;  . CARDIAC CATHETERIZATION  06/2002   "just before bypass OR"  . CARDIAC CATHETERIZATION N/A 03/01/2015   Procedure: Coronary Stent Intervention;  Surgeon: Lorretta Harp, MD;  Location: [place] [facility];  Service: Cardiovascular;  Laterality: N/A;  . CARDIAC CATHETERIZATION  02/06/2018  .  COLONOSCOPY    . CORONARY ANGIOPLASTY    . CORONARY ARTERY BYPASS GRAFT  06/2002   x5, LIMA-LAD;VG- Diag; seq VG- ramus & OM branch; VG-PDA  . CORONARY STENT INTERVENTION N/A 04/12/2017   Procedure: CORONARY STENT INTERVENTION;  Surgeon: Lorretta Harp, MD;  Location: [place] [facility];  Service: Cardiovascular;  Laterality: N/A;  . CORONARY STENT INTERVENTION N/A 02/08/2018   Procedure: CORONARY STENT INTERVENTION;  Surgeon: Jettie Booze, MD;  Location: [place] [facility];  Service: Cardiovascular;   Laterality: N/A;  . CORONARY STENT INTERVENTION N/A 11/12/2018   Procedure: CORONARY STENT INTERVENTION;  Surgeon: Wellington Hampshire, MD;  Location: [place] [facility];  Service: Cardiovascular;  Laterality: N/A;  . EP IMPLANTABLE DEVICE N/A 04/23/2015   Procedure: PPM Generator Changeout;  Surgeon: Sanda Klein, MD;  Location: [place] [facility];  Service: Cardiovascular;  Laterality: N/A;  . FALSE ANEURYSM REPAIR Left 11/29/2018   Procedure: REPAIR FALSE ANEURYSM LEFT RADIAL ARTERY;  Surgeon: [name], MD;  Location: [facility];  Service: Vascular;  Laterality: Left;  . FEMORAL ARTERY STENT Left ~ 2014   "taken out of my leg; couldn' catorgorize what kind so the put it under all 3"; cataroziepitheloid hemanioendotheliomau  . FOOT FRACTURE SURGERY Left 1973  . FRACTURE SURGERY    . INSERT / REPLACE / REMOVE PACEMAKER  10/13/05   right side, medtronic Adapta  . KNEE HARDWARE REMOVAL Right 1950's   "3-4 months after the insertion"  . KNEE SURGERY Right 1950's   "broke my lower leg; had to put pin in my knee to keep lower leg in place til it healed"  . LEFT HEART CATH AND CORS/GRAFTS ANGIOGRAPHY N/A 04/12/2017   Procedure: LEFT HEART CATH AND CORS/GRAFTS ANGIOGRAPHY;  Surgeon: Lorretta Harp, MD;  Location: [place] [facility];  Service: Cardiovascular;  Laterality: N/A;  . LEFT HEART CATH AND CORS/GRAFTS ANGIOGRAPHY N/A 02/06/2018   Procedure: LEFT HEART CATH AND CORS/GRAFTS ANGIOGRAPHY;  Surgeon: Troy Sine, MD;  Location: [place] [facility];  Service: Cardiovascular;  Laterality: N/A;  . LEFT HEART CATH AND CORS/GRAFTS ANGIOGRAPHY N/A 11/12/2018   Procedure: LEFT HEART CATH AND CORS/GRAFTS ANGIOGRAPHY;  Surgeon: Wellington Hampshire, MD;  Location: [place] [facility];  Service: Cardiovascular;  Laterality: N/A;  . PERIPHERAL VASCULAR CATHETERIZATION N/A 11/25/2015   Procedure: Lower Extremity Angiography;  Surgeon: Lorretta Harp, MD;  Location: [place] [facility];  Service: Cardiovascular;   Laterality: N/A;  . PERIPHERAL VASCULAR CATHETERIZATION Left 11/25/2015   Procedure: Peripheral Vascular Intervention;  Surgeon: Lorretta Harp, MD;  Location: [place] [facility];  Service: Cardiovascular;  Laterality: Left;  external iliac  . PERIPHERAL VASCULAR CATHETERIZATION N/A 01/03/2016   Procedure: Lower Extremity Angiography;  Surgeon: Lorretta Harp, MD;  Location: [place] [facility];  Service: Cardiovascular;  Laterality: N/A;  . PERIPHERAL VASCULAR CATHETERIZATION Left 01/03/2016   Procedure: Peripheral Vascular Intervention;  Surgeon: Lorretta Harp, MD;  Location: [place] [facility];  Service: Cardiovascular;  Laterality: Left;  SFA  . PERIPHERAL VASCULAR CATHETERIZATION Right 02/14/2016   Procedure: Peripheral Vascular Atherectomy;  Surgeon: Lorretta Harp, MD;  Location: [place] [facility];  Service: Cardiovascular;  Laterality: Right;  SFA  . POPLITEAL ARTERY STENT  01/03/2016   Contralateral access with a 7 French crossover sheath (second order catheter placement)  . TONSILLECTOMY AND ADENOIDECTOMY    . TUMOR EXCISION Right ~ 2005   cancerous tumor removed from shoulder  .  TUMOR EXCISION Right ~ 2000   benign tumor removed from under shoulder  . TUMOR EXCISION Left 06/02/2010   resection of Lt SFA wth interposition of Gore-Tex graft       Home Medications    Prior to Admission medications   Medication Sig Start Date End Date Taking? Authorizing Provider  amLODipine (NORVASC) 10 MG tablet Take 10 mg by mouth daily.    Yes [provider]  atorvastatin (LIPITOR) 80 MG tablet Take 1 tablet (80 mg total) by mouth daily at 6 PM. 02/09/18  Yes Eileen Stanford, PA-C  Cholecalciferol (VITAMIN D3) 2000 units capsule Take 2,000 Units by mouth 2 (two) times daily.   Yes [provider]  glipiZIDE (GLUCOTROL) 5 MG tablet Take 5 mg by mouth daily with breakfast.  01/10/18  Yes [provider]  hydrALAZINE (APRESOLINE) 25 MG tablet Take 25 mg by  mouth 2 (two) times daily.    Yes [provider]  hydrochlorothiazide (HYDRODIURIL) 25 MG tablet Take 25 mg by mouth every morning.   Yes [provider]  isosorbide mononitrate (IMDUR) 60 MG 24 hr tablet TAKE 1 TABLET EVERY DAY 12/01/19  Yes Lorretta Harp, MD  melatonin 5 MG TABS 1 tablet in the evening   Yes [provider]  metFORMIN (GLUCOPHAGE) 1000 MG tablet Take 1 tablet (1,000 mg total) by mouth 2 (two) times daily with a meal. 02/10/18  Yes Eileen Stanford, PA-C  metoprolol tartrate (LOPRESSOR) 100 MG tablet Take 1 tablet (100 mg total) by mouth 2 (two) times daily. 08/07/17  Yes Lendon Colonel, NP  MULTIPLE VITAMIN-FOLIC ACID PO 1 tablet   Yes [provider]  nitroGLYCERIN (NITROSTAT) 0.4 MG SL tablet Place 1 tablet (0.4 mg total) under the tongue every 5 (five) minutes as needed for chest pain. 04/15/17  Yes Theora Gianotti, NP  potassium chloride SA (K-DUR,KLOR-CON) 20 MEQ tablet Take 1 tablet (20 mEq total) by mouth daily. 02/18/16  Yes Regalado, Belkys A, MD  rivaroxaban (XARELTO) 20 MG TABS tablet Take 1 tablet (20 mg total) by mouth daily with supper. 05/01/19  Yes Croitoru, Mihai, MD  rOPINIRole (REQUIP) 1 MG tablet Take 1 mg by mouth daily.   Yes [provider]  tamsulosin (FLOMAX) 0.4 MG CAPS capsule Take 0.4 mg by mouth 2 (two) times daily.  03/09/15  Yes [provider]  amoxicillin (AMOXIL) 875 MG tablet Take one tab PO Q12hr 02/01/20   Kandra Nicolas, MD  neomycin-polymyxin-hydrocortisone (CORTISPORIN) 3.5-10000-1 OTIC suspension Place 3 drops into both ears 3 (three) times daily. 02/01/20   Kandra Nicolas, MD    Family History Family History  Problem Relation Age of Onset  . Coronary artery disease Mother   . Heart attack Mother   . Hypertension Mother   . Heart disease Mother        Open  Heart surgery  . Diabetes Father   . Heart disease Father   . Hyperlipidemia Father   . Heart attack  Father   . Hypertension Sister   . Diabetes Sister   . Heart disease Sister     Social History Social History   Tobacco Use  . Smoking status: Former Smoker    Years: 10.00    Types: Pipe    Quit date: 1973    Years since quitting: 48.8  . Smokeless tobacco: Never Used  . Tobacco comment: "quit smoking in 1973"  Vaping Use  . Vaping Use: Never used  Substance Use Topics  . Alcohol use: No  . Drug use: No     Allergies   Peanut-containing drug products, Ace inhibitors, Diltiazem hcl, Meloxicam, Eliquis [apixaban], Carvedilol, Clonidine hcl, and Duloxetine hcl   Review of Systems Review of Systems  No sore throat No cough No pleuritic pain No wheezing No nasal congestion No post-nasal drainage No sinus pain/pressure No itchy/red eyes + right earache No hemoptysis No SOB No fever/chills No nausea No vomiting No abdominal pain No diarrhea No urinary symptoms No skin rash No fatigue No myalgias No headache    Physical Exam Triage Vital Signs ED Triage Vitals  Enc Vitals Group     BP 02/01/20 1459 (!) 141/76     Pulse Rate 02/01/20 1459 80     Resp --      Temp 02/01/20 1459 98.5 F (36.9 C)     Temp Source 02/01/20 1459 Oral     SpO2 02/01/20 1459 98 %     Weight 02/01/20 1456 171 lb (77.6 kg)     Height 02/01/20 1456 5\' 7"  (1.702 m)     Head Circumference --      Peak Flow --      Pain Score 02/01/20 1456 6     Pain Loc --      Pain Edu? --      Excl. in [place]? --    No data found.  Updated Vital Signs BP (!) 141/76 (BP Location: Right Arm)   Pulse 80   Temp 98.5 F (36.9 C) (Oral)   Ht 5\' 7"  (1.702 m)   Wt 77.6 kg   SpO2 98%   BMI 26.78 kg/m   Visual Acuity Right Eye Distance:   Left Eye Distance:   Bilateral Distance:    Right Eye Near:   Left Eye Near:    Bilateral Near:     Physical Exam Vitals and nursing note reviewed.  Constitutional:      General: He is not in acute distress. HENT:     Head: Normocephalic.      Right Ear: There is impacted cerumen.     Left Ear: There is impacted cerumen.     Ears:     Comments: Both canals are completely occluded with cerumen.  Insertion of speculum in right canal causes pain.    Nose: Nose normal.  Eyes:     Extraocular Movements: Extraocular movements intact.     Conjunctiva/sclera: Conjunctivae normal.     Pupils: Pupils are equal, round, and reactive to light.  Cardiovascular:     Rate and Rhythm: Normal rate.  Pulmonary:     Effort: Pulmonary effort is normal.  Musculoskeletal:     Cervical back: Neck supple.  Lymphadenopathy:     Cervical: No cervical adenopathy.  Skin:    General: Skin is warm and dry.  Neurological:     Mental Status: He is alert and oriented to person, place, and time.      UC Treatments / Results  Labs (all labs ordered are listed, but only abnormal results are displayed) Labs Reviewed - No data to display  EKG   Radiology No results found.  Procedures Procedures (including critical care time)  Medications Ordered in UC Medications - No data to display  Initial Impression / Assessment and Plan / UC Course  I have reviewed the triage vital signs and the nursing notes.  Pertinent labs & imaging results that were available during my care of the patient were reviewed  by me and considered in my medical decision making (see chart for details).    ?otitis media behind right cerumen impaction.  Begin empiric amoxicillin and Cortisporin otic suspension. Return about 8 days for repeat lavage, or follow-up with ENT. Final Clinical Impressions(s) / UC Diagnoses   Final diagnoses:  Bilateral impacted cerumen  Otalgia of right ear     Discharge Instructions     To prevent recurrent ear wax blockage in the future, try the following: Soak two cotton balls with mineral oil, and gently place in each ear canal once weekly.  Leave the cotton balls in place for 10 to 20 minutes.  This will help liquefy the ear wax and aid  your body's normal elimination process.  If applicable, do not use a hearing aid for 8 hours overnight.  Have your ears cleaned by a health professional every 6 to 12 months.  Avoid using "Q-tips" and ear wax softening solutions     ED Prescriptions    Medication Sig Dispense Auth. Provider   amoxicillin (AMOXIL) 875 MG tablet Take one tab PO Q12hr 14 tablet Kandra Nicolas, MD   neomycin-polymyxin-hydrocortisone (CORTISPORIN) 3.5-10000-1 OTIC suspension Place 3 drops into both ears 3 (three) times daily. 7.5 mL Kandra Nicolas, MD        Kandra Nicolas, MD 02/02/20 [phone]

## 2020-02-01 NOTE — Discharge Instructions (Addendum)
To prevent recurrent ear wax blockage in the future, try the following: Soak two cotton balls with mineral oil, and gently place in each ear canal once weekly.  Leave the cotton balls in place for 10 to 20 minutes.  This will help liquefy the ear wax and aid your body's normal elimination process.  If applicable, do not use a hearing aid for 8 hours overnight.  Have your ears cleaned by a health professional every 6 to 12 months.  Avoid using "Q-tips" and ear wax softening solutions

## 2020-02-01 NOTE — ED Triage Notes (Signed)
Pt states that he has some right ear pain. x6 days

## 2020-02-01 NOTE — ED Notes (Signed)
Attempts to lavage bilateral ears unsuccessful.

## 2020-02-02 DIAGNOSIS — I10 Essential (primary) hypertension: Secondary | ICD-10-CM | POA: Diagnosis not present

## 2020-02-02 DIAGNOSIS — Z6826 Body mass index (BMI) 26.0-26.9, adult: Secondary | ICD-10-CM | POA: Diagnosis not present

## 2020-02-02 DIAGNOSIS — Z950 Presence of cardiac pacemaker: Secondary | ICD-10-CM | POA: Diagnosis not present

## 2020-02-02 DIAGNOSIS — I495 Sick sinus syndrome: Secondary | ICD-10-CM | POA: Diagnosis not present

## 2020-02-02 NOTE — Progress Notes (Signed)
Remote pacemaker transmission.   

## 2020-02-04 DIAGNOSIS — S62635D Displaced fracture of distal phalanx of left ring finger, subsequent encounter for fracture with routine healing: Secondary | ICD-10-CM | POA: Diagnosis not present

## 2020-02-04 DIAGNOSIS — R29898 Other symptoms and signs involving the musculoskeletal system: Secondary | ICD-10-CM | POA: Diagnosis not present

## 2020-02-04 DIAGNOSIS — S62613D Displaced fracture of proximal phalanx of left middle finger, subsequent encounter for fracture with routine healing: Secondary | ICD-10-CM | POA: Diagnosis not present

## 2020-02-04 DIAGNOSIS — M25642 Stiffness of left hand, not elsewhere classified: Secondary | ICD-10-CM | POA: Diagnosis not present

## 2020-02-04 DIAGNOSIS — R278 Other lack of coordination: Secondary | ICD-10-CM | POA: Diagnosis not present

## 2020-02-04 DIAGNOSIS — M6281 Muscle weakness (generalized): Secondary | ICD-10-CM | POA: Diagnosis not present

## 2020-02-04 DIAGNOSIS — M79642 Pain in left hand: Secondary | ICD-10-CM | POA: Diagnosis not present

## 2020-02-06 DIAGNOSIS — H903 Sensorineural hearing loss, bilateral: Secondary | ICD-10-CM | POA: Diagnosis not present

## 2020-02-06 DIAGNOSIS — H6123 Impacted cerumen, bilateral: Secondary | ICD-10-CM | POA: Diagnosis not present

## 2020-02-11 DIAGNOSIS — Z4789 Encounter for other orthopedic aftercare: Secondary | ICD-10-CM | POA: Diagnosis not present

## 2020-02-11 DIAGNOSIS — S62635D Displaced fracture of distal phalanx of left ring finger, subsequent encounter for fracture with routine healing: Secondary | ICD-10-CM | POA: Diagnosis not present

## 2020-02-11 DIAGNOSIS — S62613D Displaced fracture of proximal phalanx of left middle finger, subsequent encounter for fracture with routine healing: Secondary | ICD-10-CM | POA: Diagnosis not present

## 2020-02-17 ENCOUNTER — Encounter (HOSPITAL_COMMUNITY): Payer: Medicare HMO

## 2020-02-18 DIAGNOSIS — S62635D Displaced fracture of distal phalanx of left ring finger, subsequent encounter for fracture with routine healing: Secondary | ICD-10-CM | POA: Diagnosis not present

## 2020-02-18 DIAGNOSIS — S62613D Displaced fracture of proximal phalanx of left middle finger, subsequent encounter for fracture with routine healing: Secondary | ICD-10-CM | POA: Diagnosis not present

## 2020-02-18 DIAGNOSIS — Z4789 Encounter for other orthopedic aftercare: Secondary | ICD-10-CM | POA: Diagnosis not present

## 2020-02-24 ENCOUNTER — Other Ambulatory Visit: Payer: Self-pay | Admitting: Physician Assistant

## 2020-02-24 ENCOUNTER — Ambulatory Visit
Admission: RE | Admit: 2020-02-24 | Discharge: 2020-02-24 | Disposition: A | Discharge status: Home / Self Care | Payer: Medicare HMO | Source: Ambulatory Visit | Attending: Physician Assistant | Admitting: Physician Assistant

## 2020-02-24 DIAGNOSIS — M25552 Pain in left hip: Secondary | ICD-10-CM

## 2020-02-24 DIAGNOSIS — M79652 Pain in left thigh: Secondary | ICD-10-CM | POA: Diagnosis not present

## 2020-02-24 DIAGNOSIS — M545 Low back pain, unspecified: Secondary | ICD-10-CM | POA: Diagnosis not present

## 2020-02-24 DIAGNOSIS — R102 Pelvic and perineal pain: Secondary | ICD-10-CM | POA: Diagnosis not present

## 2020-02-24 DIAGNOSIS — M5442 Lumbago with sciatica, left side: Secondary | ICD-10-CM | POA: Diagnosis not present

## 2020-02-25 DIAGNOSIS — S62635D Displaced fracture of distal phalanx of left ring finger, subsequent encounter for fracture with routine healing: Secondary | ICD-10-CM | POA: Diagnosis not present

## 2020-02-25 DIAGNOSIS — Z4789 Encounter for other orthopedic aftercare: Secondary | ICD-10-CM | POA: Diagnosis not present

## 2020-02-25 DIAGNOSIS — S62613D Displaced fracture of proximal phalanx of left middle finger, subsequent encounter for fracture with routine healing: Secondary | ICD-10-CM | POA: Diagnosis not present

## 2020-02-26 ENCOUNTER — Other Ambulatory Visit: Payer: Self-pay

## 2020-02-26 ENCOUNTER — Ambulatory Visit (INDEPENDENT_AMBULATORY_CARE_PROVIDER_SITE_OTHER): Payer: Medicare HMO | Admitting: Cardiovascular Disease

## 2020-02-26 ENCOUNTER — Encounter: Payer: Self-pay | Admitting: Cardiovascular Disease

## 2020-02-26 VITALS — BP 130/66 | HR 94 | Ht 67.0 in | Wt 170.0 lb

## 2020-02-26 DIAGNOSIS — I480 Paroxysmal atrial fibrillation: Secondary | ICD-10-CM | POA: Diagnosis not present

## 2020-02-26 DIAGNOSIS — I25708 Atherosclerosis of coronary artery bypass graft(s), unspecified, with other forms of angina pectoris: Secondary | ICD-10-CM | POA: Diagnosis not present

## 2020-02-26 DIAGNOSIS — I10 Essential (primary) hypertension: Secondary | ICD-10-CM | POA: Diagnosis not present

## 2020-02-26 DIAGNOSIS — E785 Hyperlipidemia, unspecified: Secondary | ICD-10-CM | POA: Diagnosis not present

## 2020-02-26 DIAGNOSIS — I495 Sick sinus syndrome: Secondary | ICD-10-CM | POA: Diagnosis not present

## 2020-02-26 DIAGNOSIS — E118 Type 2 diabetes mellitus with unspecified complications: Secondary | ICD-10-CM

## 2020-02-26 DIAGNOSIS — I739 Peripheral vascular disease, unspecified: Secondary | ICD-10-CM

## 2020-02-26 DIAGNOSIS — Z950 Presence of cardiac pacemaker: Secondary | ICD-10-CM | POA: Diagnosis not present

## 2020-02-26 DIAGNOSIS — Z7901 Long term (current) use of anticoagulants: Secondary | ICD-10-CM

## 2020-02-26 NOTE — Patient Instructions (Signed)

## 2020-02-26 NOTE — Progress Notes (Signed)
Cardiology Office Note:    Date:  02/28/2020   ID:  Robert Burns, DOB 01/28/1941, MRN [id number]  PCP:  [name], MD  Cardiologist:  Sanda Klein, MD (pacemaker) ; Ancil Linsey, MD  Referring MD: [name], MD   Chief complaint: Pacemaker follow-up  History of Present Illness:    Robert Burns is a 79 y.o. male with a hx of symptomatic sinus bradycardia who received a dual-chamber permanent pacemaker in 2007 with a generator change out in January 2017 (Medtronic Adapta).  Returning for follow-up.  He sees Dr. Quay Burow for coronary atherosclerosis with multiple revascularization procedures (bypass surgery 2004, drug-eluting stent to SVG-PDA and SVG to diagonal in 2016, drug-eluting stents x2 to LAD and in-stent angioplasty for restenosis SVG-diagonal and proximal limb of sequential SVG-OM 02/08/2018) and PAD (angioplasty left SFA 2004 and stent September 2017, right SFA November 2017).  He has normal left ventricular systolic function by echo last performed 02/07/2018, with "posterior basal hypokinesis".  He has biatrial dilation and right ventricular dilation by echo.  His last nuclear study in 2018 showed normal perfusion with EF of 42%, but subsequent echo showed EF normal at 55%.  He has not had any problems with exertional angina or dyspnea.  He had 1 fall, a mechanical trip.  Thankfully this was not associated with any serious injury or bleeding complications.  He is compliant with chronic Xarelto anticoagulation and has tolerated this well.  He denies palpitations, dizziness or syncope, lower extremity edema, orthopnea or PND.  He has not had any focal neurological complaints.  Pacemaker interrogation shows normal device function.  His current generator implanted in 2017 still has 9.5 years of anticipated longevity.  He has 99.8% atrial pacing and only 2.7% ventricular pacing.  The burden of atrial fibrillation has been very low at around 0.1%.  The longest episode  lasted for only 2-1/2 hours.  There is good ventricular rate control during the episodes of atrial fibrillation.  The heart rate histogram distribution is also in favorable range.  He had a single brief episode of nonsustained VT that occurred several weeks ago and was asymptomatic.  He presented with unstable angina on February 04, 2018 and cardiac catheterization which showed multiple coronary stenoses involving both the native and graft vessels.  He was evaluated for redo bypass surgery but felt to be a poor candidate and subsequently underwent revascularization percutaneously by Dr. Irish Lack.  He received 2 drug-eluting stents to the LAD artery and had angioplasty to restenotic lesions previously placed stents to have his vein grafts (SVG-diagonal, proximal limb of sequential SVG-OM-PLV with distal occluded).  He does have occlusion of the native right coronary artery and had several lesions in the saphenous vein graft to the RCA including an 80% distal stenosis.  He was readmitted in August 2020 with unstable angina and negative cardiac enzymes and had a stent to the distal segment of the SVG to PDA as well as a stent in the SVG to the diagonal artery.  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 Donnetta Hutching.    Past Medical History:  Diagnosis Date  . Arthritis    "all over" (11/25/2015)  . CAD (coronary artery disease)    a. s/p CABG  06/2002; b. 02/01/15 PCI: DES to prox SVG to PDA, staged PCI of SVG to Diag in 02/2015; c. 04/2017 Cath/PCI: LM nl, LAD 100ost, 49m, 75d, LCX 60ost, OM2 80, RCA 100ost, RPDA 80, LIMA->LAD  ok, VG->D1 patent stent, VG->RPDA patent stent, 50p, VG->OM1->OM2 90p (3.0x24 Synergy DES), 100 between OM1->OM2 (med rx).  . Cancer ([place])    Right Shoulder, Left Leg- BCC, SCC, AND MELANOMA  . Epithelioid hemangioendothelioma    s/p resection of left SFA/mass with interposition 6 mm GoreTex graft 06/02/10, s/p repeat resection for  positive margins 09/22/10  . Hyperlipidemia   . Hypertension   . Leg pain   . OSA on CPAP   . PAD (peripheral artery disease) ([place])    a. 10/2002 L SFA PTA/BMS; b. 8/17 LE Angio: LEIA 90 (9x40 self exp stent), LSFA short segment prox occlusion (staged PTA/stenting 01/03/2016), patent mid stent, RSFA 83m (staged PTA/DEB 02/14/2016).  . Presence of permanent cardiac pacemaker    Medtronic  . SSS (sick sinus syndrome) ([place])    a. s/p PPM in 2007 with gen change 04/2015 - Medtronic Adapta ADDRL1, ser # [id number].  . Type II diabetes mellitus ([place])    Type II  . Urgency of urination     Past Surgical History:  Procedure Laterality Date  . CARDIAC CATHETERIZATION N/A 02/01/2015   Procedure: Left Heart Cath and Coronary Angiography;  Surgeon: Lorretta Harp, MD;  Location: [facility];  Service: Cardiovascular;  Laterality: N/A;  . CARDIAC CATHETERIZATION N/A 02/01/2015   Procedure: Coronary Stent Intervention;  Surgeon: Lorretta Harp, MD;  Location: [facility];  Service: Cardiovascular;  Laterality: N/A;  . CARDIAC CATHETERIZATION  06/2002   "just before bypass OR"  . CARDIAC CATHETERIZATION N/A 03/01/2015   Procedure: Coronary Stent Intervention;  Surgeon: Lorretta Harp, MD;  Location: [facility];  Service: Cardiovascular;  Laterality: N/A;  . CARDIAC CATHETERIZATION  02/06/2018  . COLONOSCOPY    . CORONARY ANGIOPLASTY    . CORONARY ARTERY BYPASS GRAFT  06/2002   x5, LIMA-LAD;VG- Diag; seq VG- ramus & OM branch; VG-PDA  . CORONARY STENT INTERVENTION N/A 04/12/2017   Procedure: CORONARY STENT INTERVENTION;  Surgeon: Lorretta Harp, MD;  Location: [facility];  Service: Cardiovascular;  Laterality: N/A;  . CORONARY STENT INTERVENTION N/A 02/08/2018   Procedure: CORONARY STENT INTERVENTION;  Surgeon: Jettie Booze, MD;  Location: [facility];  Service: Cardiovascular;  Laterality: N/A;  . CORONARY STENT INTERVENTION N/A 11/12/2018   Procedure:  CORONARY STENT INTERVENTION;  Surgeon: Wellington Hampshire, MD;  Location: [facility];  Service: Cardiovascular;  Laterality: N/A;  . EP IMPLANTABLE DEVICE N/A 04/23/2015   Procedure: PPM Generator Changeout;  Surgeon: Sanda Klein, MD;  Location: [facility];  Service: Cardiovascular;  Laterality: N/A;  . FALSE ANEURYSM REPAIR Left 11/29/2018   Procedure: REPAIR FALSE ANEURYSM LEFT RADIAL ARTERY;  Surgeon: [name], MD;  Location: [facility];  Service: Vascular;  Laterality: Left;  . FEMORAL ARTERY STENT Left ~ 2014   "taken out of my leg; couldn' catorgorize what kind so the put it under all 3"; cataroziepitheloid hemanioendotheliomau  . FOOT FRACTURE SURGERY Left 1973  . FRACTURE SURGERY    . INSERT / REPLACE / REMOVE PACEMAKER  10/13/05   right side, medtronic Adapta  . KNEE HARDWARE REMOVAL Right 1950's   "3-4 months after the insertion"  . KNEE SURGERY Right 1950's   "broke my lower leg; had to put pin in my knee to keep lower leg in place til it healed"  . LEFT HEART CATH AND CORS/GRAFTS ANGIOGRAPHY N/A 04/12/2017   Procedure: LEFT HEART CATH AND CORS/GRAFTS  ANGIOGRAPHY;  Surgeon: Lorretta Harp, MD;  Location: [facility];  Service: Cardiovascular;  Laterality: N/A;  . LEFT HEART CATH AND CORS/GRAFTS ANGIOGRAPHY N/A 02/06/2018   Procedure: LEFT HEART CATH AND CORS/GRAFTS ANGIOGRAPHY;  Surgeon: Troy Sine, MD;  Location: [facility];  Service: Cardiovascular;  Laterality: N/A;  . LEFT HEART CATH AND CORS/GRAFTS ANGIOGRAPHY N/A 11/12/2018   Procedure: LEFT HEART CATH AND CORS/GRAFTS ANGIOGRAPHY;  Surgeon: Wellington Hampshire, MD;  Location: [facility];  Service: Cardiovascular;  Laterality: N/A;  . PERIPHERAL VASCULAR CATHETERIZATION N/A 11/25/2015   Procedure: Lower Extremity Angiography;  Surgeon: Lorretta Harp, MD;  Location: [facility];  Service: Cardiovascular;  Laterality: N/A;  . PERIPHERAL VASCULAR CATHETERIZATION Left 11/25/2015    Procedure: Peripheral Vascular Intervention;  Surgeon: Lorretta Harp, MD;  Location: [facility];  Service: Cardiovascular;  Laterality: Left;  external iliac  . PERIPHERAL VASCULAR CATHETERIZATION N/A 01/03/2016   Procedure: Lower Extremity Angiography;  Surgeon: Lorretta Harp, MD;  Location: [facility];  Service: Cardiovascular;  Laterality: N/A;  . PERIPHERAL VASCULAR CATHETERIZATION Left 01/03/2016   Procedure: Peripheral Vascular Intervention;  Surgeon: Lorretta Harp, MD;  Location: [facility];  Service: Cardiovascular;  Laterality: Left;  SFA  . PERIPHERAL VASCULAR CATHETERIZATION Right 02/14/2016   Procedure: Peripheral Vascular Atherectomy;  Surgeon: Lorretta Harp, MD;  Location: [facility];  Service: Cardiovascular;  Laterality: Right;  SFA  . POPLITEAL ARTERY STENT  01/03/2016   Contralateral access with a 7 French crossover sheath (second order catheter placement)  . TONSILLECTOMY AND ADENOIDECTOMY    . TUMOR EXCISION Right ~ 2005   cancerous tumor removed from shoulder  . TUMOR EXCISION Right ~ 2000   benign tumor removed from under shoulder  . TUMOR EXCISION Left 06/02/2010   resection of Lt SFA wth interposition of Gore-Tex graft    Current Medications: Current Meds  Medication Sig  . amLODipine (NORVASC) 10 MG tablet Take 10 mg by mouth daily.   [name] amoxicillin (AMOXIL) 875 MG tablet Take one tab PO Q12hr  . atorvastatin (LIPITOR) 80 MG tablet Take 1 tablet (80 mg total) by mouth daily at 6 PM.  . Cholecalciferol (VITAMIN D3) 2000 units capsule Take 2,000 Units by mouth 2 (two) times daily.  [name] glipiZIDE (GLUCOTROL) 5 MG tablet Take 5 mg by mouth daily with breakfast.   . hydrALAZINE (APRESOLINE) 25 MG tablet Take 25 mg by mouth 2 (two) times daily.   . hydrochlorothiazide (HYDRODIURIL) 25 MG tablet Take 25 mg by mouth every morning.  . isosorbide mononitrate (IMDUR) 60 MG 24 hr tablet TAKE 1 TABLET EVERY DAY  . melatonin 5 MG TABS 1  tablet in the evening  . metFORMIN (GLUCOPHAGE) 1000 MG tablet Take 1 tablet (1,000 mg total) by mouth 2 (two) times daily with a meal.  . metoprolol tartrate (LOPRESSOR) 100 MG tablet Take 1 tablet (100 mg total) by mouth 2 (two) times daily.  . MULTIPLE VITAMIN-FOLIC ACID PO 1 tablet  . neomycin-polymyxin-hydrocortisone (CORTISPORIN) 3.5-10000-1 OTIC suspension Place 3 drops into both ears 3 (three) times daily.  . nitroGLYCERIN (NITROSTAT) 0.4 MG SL tablet Place 1 tablet (0.4 mg total) under the tongue every 5 (five) minutes as needed for chest pain.  . potassium chloride SA (K-DUR,KLOR-CON) 20 MEQ tablet Take 1 tablet (20 mEq total) by mouth daily.  . rivaroxaban (XARELTO) 20 MG TABS tablet Take 1 tablet (20 mg  total) by mouth daily with supper.  [name] rOPINIRole (REQUIP) 1 MG tablet Take 1 mg by mouth daily.  . tamsulosin (FLOMAX) 0.4 MG CAPS capsule Take 0.4 mg by mouth 2 (two) times daily.      Allergies:   Peanut-containing drug products, Ace inhibitors, Diltiazem hcl, Meloxicam, Eliquis [apixaban], Carvedilol, Clonidine hcl, and Duloxetine hcl   Social History   Socioeconomic History  . Marital status: Married    Spouse name: Not on file  . Number of children: Not on file  . Years of education: Not on file  . Highest education level: Not on file  Occupational History  . Not on file  Tobacco Use  . Smoking status: Former Smoker    Years: 10.00    Types: Pipe    Quit date: 1973    Years since quitting: 48.9  . Smokeless tobacco: Never Used  . Tobacco comment: "quit smoking in 1973"  Vaping Use  . Vaping Use: Never used  Substance and Sexual Activity  . Alcohol use: No  . Drug use: No  . Sexual activity: Not Currently  Other Topics Concern  . Not on file  Social History Narrative  . Not on file   Social Determinants of Health   Financial Resource Strain:   . Difficulty of Paying Living Expenses: Not on file  Food Insecurity:   . Worried About Charity fundraiser in  the Last Year: Not on file  . Ran Out of Food in the Last Year: Not on file  Transportation Needs:   . Lack of Transportation (Medical): Not on file  . Lack of Transportation (Non-Medical): Not on file  Physical Activity:   . Days of Exercise per Week: Not on file  . Minutes of Exercise per Session: Not on file  Stress:   . Feeling of Stress : Not on file  Social Connections:   . Frequency of Communication with Friends and Family: Not on file  . Frequency of Social Gatherings with Friends and Family: Not on file  . Attends Religious Services: Not on file  . Active Member of Clubs or Organizations: Not on file  . Attends Archivist Meetings: Not on file  . Marital Status: Not on file     Family History: The patient's family history includes Coronary artery disease in his mother; Diabetes in his father and sister; Heart attack in his father and mother; Heart disease in his father, mother, and sister; Hyperlipidemia in his father; Hypertension in his mother and sister. ROS:   Please see the history of present illness.    All other systems reviewed and are negative.  EKGs/Labs/Other Studies Reviewed:    EKG:  EKG is not ordered today.  Intracardiac electrogram shows atrial paced, ventricular sensed rhythm.  ECG from 01/09/2020 shows atrial paced, ventricular sensed rhythm with a QS pattern in leads V1-V4 but without acute repolarization normalities  Recent Labs: No results found for requested labs within last 8760 hours.  Recent Lipid Panel 04/16/2018 total cholesterol 78, HDL 31, LDL 33, triglycerides 69 06/19/2019 Total cholesterol 78, HDL 28, LDL 31, triglycerides 100 Creatinine 0.98, potassium 4.3, normal liver function tests 10/14/2019 Hemoglobin A1c 7.7%  Physical Exam:    VS:  BP 130/66   Pulse 94   Ht 5\' 7"  (1.702 m)   Wt 170 lb (77.1 kg)   SpO2 96%   BMI 26.63 kg/m     Wt Readings from Last 3 Encounters:  02/26/20 170 lb (77.1 kg)  02/01/20 171 lb  (77.6 kg)  01/09/20 178 lb (80.7 kg)     General: Alert, oriented x3, no distress, healthy right subclavian pacemaker site Head: no evidence of trauma, PERRL, EOMI, no exophtalmos or lid lag, no myxedema, no xanthelasma; normal ears, nose and oropharynx Neck: normal jugular venous pulsations and no hepatojugular reflux; brisk carotid pulses without delay and no carotid bruits Chest: clear to auscultation, no signs of consolidation by percussion or palpation, normal fremitus, symmetrical and full respiratory excursions Cardiovascular: normal position and quality of the apical impulse, regular rhythm, normal first and widely split second heart sounds, no murmurs, rubs or gallops Abdomen: no tenderness or distention, no masses by palpation, no abnormal pulsatility or arterial bruits, normal bowel sounds, no hepatosplenomegaly Extremities: no clubbing, cyanosis or edema; 2+ radial, ulnar and brachial pulses bilaterally; 2+ right femoral, posterior tibial and dorsalis pedis pulses; 2+ left femoral, posterior tibial and dorsalis pedis pulses; no subclavian or femoral bruits Neurological: grossly nonfocal Psych: Normal mood and affect   ASSESSMENT:    1. Coronary artery disease of bypass graft of native heart with stable angina pectoris (HCC)   2. Paroxysmal atrial fibrillation ([place])   3. SSS (sick sinus syndrome), medtronic adapta   4. Pacemaker   5. PAD (peripheral artery disease) ([place])   6. Dyslipidemia (high LDL; low HDL)   7. Essential hypertension   8. Type 2 diabetes mellitus with complication, without long-term current use of insulin ([place])   9. Long term (current) use of anticoagulants    PLAN:    In order of problems listed above:  1. CAD: Angina is well controlled on 3 antianginal medications.  2. Parox AFib: Asymptomatic, infrequent, well rate controlled.  CHADSVasc 5 (age, hypertension, vascular disease, diabetes).   3. SSS: No clinical or histogram evidence of chronotropic  incompetence with current sensor settings. 4. PPM: Continue remote downloads every 3 months. 5. PAD: Followed by Dr. Gwenlyn Found, denies intermittent claudication currently. 6. HLP: Outstanding LDL, but continues to have a very low HDL. 7. HTN: Well-controlled. 8. DM: Improved hemoglobin A1c at 7.7%. 9. Anticoagulation: He has had 1 fall, a mechanical trip, associated with injury to his hand but no head injury.  No serious bleeding complications.  Continue anticoagulation.   Medication Adjustments/Labs and Tests Ordered: Current medicines are reviewed at length with the patient today.  Concerns regarding medicines are outlined above.  No orders of the defined types were placed in this encounter.  No orders of the defined types were placed in this encounter.   Signed, Sanda Klein, MD  02/28/2020 9:54 AM    [facility]

## 2020-02-27 DIAGNOSIS — S62613D Displaced fracture of proximal phalanx of left middle finger, subsequent encounter for fracture with routine healing: Secondary | ICD-10-CM | POA: Diagnosis not present

## 2020-02-27 DIAGNOSIS — S62635D Displaced fracture of distal phalanx of left ring finger, subsequent encounter for fracture with routine healing: Secondary | ICD-10-CM | POA: Diagnosis not present

## 2020-02-27 DIAGNOSIS — M25642 Stiffness of left hand, not elsewhere classified: Secondary | ICD-10-CM | POA: Diagnosis not present

## 2020-02-28 ENCOUNTER — Encounter: Payer: Self-pay | Admitting: Cardiovascular Disease

## 2020-03-03 DIAGNOSIS — S62635D Displaced fracture of distal phalanx of left ring finger, subsequent encounter for fracture with routine healing: Secondary | ICD-10-CM | POA: Diagnosis not present

## 2020-03-03 DIAGNOSIS — Z4789 Encounter for other orthopedic aftercare: Secondary | ICD-10-CM | POA: Diagnosis not present

## 2020-03-03 DIAGNOSIS — S62613D Displaced fracture of proximal phalanx of left middle finger, subsequent encounter for fracture with routine healing: Secondary | ICD-10-CM | POA: Diagnosis not present

## 2020-03-11 DIAGNOSIS — M79642 Pain in left hand: Secondary | ICD-10-CM | POA: Diagnosis not present

## 2020-03-15 ENCOUNTER — Other Ambulatory Visit: Payer: Self-pay | Admitting: Cardiovascular Disease

## 2020-03-15 DIAGNOSIS — G4733 Obstructive sleep apnea (adult) (pediatric): Secondary | ICD-10-CM

## 2020-03-15 DIAGNOSIS — I739 Peripheral vascular disease, unspecified: Secondary | ICD-10-CM

## 2020-03-15 DIAGNOSIS — E1169 Type 2 diabetes mellitus with other specified complication: Secondary | ICD-10-CM

## 2020-03-15 DIAGNOSIS — Z9989 Dependence on other enabling machines and devices: Secondary | ICD-10-CM

## 2020-03-15 DIAGNOSIS — I25810 Atherosclerosis of coronary artery bypass graft(s) without angina pectoris: Secondary | ICD-10-CM

## 2020-03-15 DIAGNOSIS — I6523 Occlusion and stenosis of bilateral carotid arteries: Secondary | ICD-10-CM

## 2020-03-15 DIAGNOSIS — M5136 Other intervertebral disc degeneration, lumbar region: Secondary | ICD-10-CM | POA: Diagnosis not present

## 2020-03-15 DIAGNOSIS — M1712 Unilateral primary osteoarthritis, left knee: Secondary | ICD-10-CM | POA: Diagnosis not present

## 2020-03-15 DIAGNOSIS — I480 Paroxysmal atrial fibrillation: Secondary | ICD-10-CM

## 2020-03-16 DIAGNOSIS — M79642 Pain in left hand: Secondary | ICD-10-CM | POA: Diagnosis not present

## 2020-03-19 ENCOUNTER — Other Ambulatory Visit: Payer: Self-pay

## 2020-03-19 ENCOUNTER — Other Ambulatory Visit: Payer: Self-pay | Admitting: Cardiovascular Disease

## 2020-03-19 ENCOUNTER — Ambulatory Visit (HOSPITAL_COMMUNITY)
Admission: RE | Admit: 2020-03-19 | Discharge: 2020-03-19 | Disposition: A | Discharge status: Home / Self Care | Payer: Medicare HMO | Source: Ambulatory Visit | Attending: Cardiovascular Disease | Admitting: Cardiovascular Disease

## 2020-03-19 DIAGNOSIS — G4733 Obstructive sleep apnea (adult) (pediatric): Secondary | ICD-10-CM

## 2020-03-19 DIAGNOSIS — E785 Hyperlipidemia, unspecified: Secondary | ICD-10-CM | POA: Diagnosis not present

## 2020-03-19 DIAGNOSIS — I480 Paroxysmal atrial fibrillation: Secondary | ICD-10-CM | POA: Diagnosis not present

## 2020-03-19 DIAGNOSIS — I25810 Atherosclerosis of coronary artery bypass graft(s) without angina pectoris: Secondary | ICD-10-CM

## 2020-03-19 DIAGNOSIS — I739 Peripheral vascular disease, unspecified: Secondary | ICD-10-CM | POA: Insufficient documentation

## 2020-03-19 DIAGNOSIS — I6523 Occlusion and stenosis of bilateral carotid arteries: Secondary | ICD-10-CM | POA: Diagnosis not present

## 2020-03-19 DIAGNOSIS — E1169 Type 2 diabetes mellitus with other specified complication: Secondary | ICD-10-CM | POA: Insufficient documentation

## 2020-03-19 DIAGNOSIS — Z9989 Dependence on other enabling machines and devices: Secondary | ICD-10-CM

## 2020-03-24 ENCOUNTER — Encounter (HOSPITAL_COMMUNITY): Payer: Medicare HMO

## 2020-03-24 DIAGNOSIS — M79642 Pain in left hand: Secondary | ICD-10-CM | POA: Diagnosis not present

## 2020-03-29 DIAGNOSIS — M79642 Pain in left hand: Secondary | ICD-10-CM | POA: Diagnosis not present

## 2020-03-30 DIAGNOSIS — F330 Major depressive disorder, recurrent, mild: Secondary | ICD-10-CM | POA: Diagnosis not present

## 2020-03-30 DIAGNOSIS — E1142 Type 2 diabetes mellitus with diabetic polyneuropathy: Secondary | ICD-10-CM | POA: Diagnosis not present

## 2020-03-30 DIAGNOSIS — I480 Paroxysmal atrial fibrillation: Secondary | ICD-10-CM | POA: Diagnosis not present

## 2020-03-30 DIAGNOSIS — I10 Essential (primary) hypertension: Secondary | ICD-10-CM | POA: Diagnosis not present

## 2020-03-30 DIAGNOSIS — M160 Bilateral primary osteoarthritis of hip: Secondary | ICD-10-CM | POA: Diagnosis not present

## 2020-03-30 DIAGNOSIS — I25709 Atherosclerosis of coronary artery bypass graft(s), unspecified, with unspecified angina pectoris: Secondary | ICD-10-CM | POA: Diagnosis not present

## 2020-03-30 DIAGNOSIS — E7800 Pure hypercholesterolemia, unspecified: Secondary | ICD-10-CM | POA: Diagnosis not present

## 2020-03-30 DIAGNOSIS — E1151 Type 2 diabetes mellitus with diabetic peripheral angiopathy without gangrene: Secondary | ICD-10-CM | POA: Diagnosis not present

## 2020-03-30 DIAGNOSIS — F320 Major depressive disorder, single episode, mild: Secondary | ICD-10-CM | POA: Diagnosis not present

## 2020-03-31 ENCOUNTER — Telehealth: Payer: Self-pay | Admitting: Cardiovascular Disease

## 2020-03-31 NOTE — Telephone Encounter (Signed)
Attempted to return call to patient, left message that patient assistance forms will be left at the front desk for patient to fill out and return to office. Left message for patient to return call to office.

## 2020-03-31 NOTE — Telephone Encounter (Signed)
Pt c/o medication issue:  1. Name of Medication: rivaroxaban (XARELTO) 20 MG TABS tablet  2. How are you currently taking this medication (dosage and times per day)? 1 tablet daily  3. Are you having a reaction (difficulty breathing--STAT)? no  4. What is your medication issue? Patient states he has been getting financial aid for the medication, but every year the office has to apply for it for him. He would like to know if it has already been applied or if not then when it will.

## 2020-04-12 DIAGNOSIS — E1151 Type 2 diabetes mellitus with diabetic peripheral angiopathy without gangrene: Secondary | ICD-10-CM | POA: Diagnosis not present

## 2020-04-12 DIAGNOSIS — I25709 Atherosclerosis of coronary artery bypass graft(s), unspecified, with unspecified angina pectoris: Secondary | ICD-10-CM | POA: Diagnosis not present

## 2020-04-12 DIAGNOSIS — G4733 Obstructive sleep apnea (adult) (pediatric): Secondary | ICD-10-CM | POA: Diagnosis not present

## 2020-04-12 DIAGNOSIS — I739 Peripheral vascular disease, unspecified: Secondary | ICD-10-CM | POA: Diagnosis not present

## 2020-04-12 DIAGNOSIS — E1142 Type 2 diabetes mellitus with diabetic polyneuropathy: Secondary | ICD-10-CM | POA: Diagnosis not present

## 2020-04-20 DIAGNOSIS — H2513 Age-related nuclear cataract, bilateral: Secondary | ICD-10-CM | POA: Diagnosis not present

## 2020-04-20 DIAGNOSIS — H40033 Anatomical narrow angle, bilateral: Secondary | ICD-10-CM | POA: Diagnosis not present

## 2020-04-20 DIAGNOSIS — H2512 Age-related nuclear cataract, left eye: Secondary | ICD-10-CM | POA: Diagnosis not present

## 2020-04-20 DIAGNOSIS — E119 Type 2 diabetes mellitus without complications: Secondary | ICD-10-CM | POA: Diagnosis not present

## 2020-04-20 DIAGNOSIS — Z7984 Long term (current) use of oral hypoglycemic drugs: Secondary | ICD-10-CM | POA: Diagnosis not present

## 2020-04-20 DIAGNOSIS — H348112 Central retinal vein occlusion, right eye, stable: Secondary | ICD-10-CM | POA: Diagnosis not present

## 2020-04-20 DIAGNOSIS — H35372 Puckering of macula, left eye: Secondary | ICD-10-CM | POA: Diagnosis not present

## 2020-04-20 DIAGNOSIS — H5703 Miosis: Secondary | ICD-10-CM | POA: Diagnosis not present

## 2020-04-27 ENCOUNTER — Ambulatory Visit (INDEPENDENT_AMBULATORY_CARE_PROVIDER_SITE_OTHER): Payer: Medicare HMO

## 2020-04-27 DIAGNOSIS — I495 Sick sinus syndrome: Secondary | ICD-10-CM

## 2020-04-29 LAB — CUP PACEART REMOTE DEVICE CHECK
Battery Impedance: 357 Ohm
Battery Remaining Longevity: 104 mo
Battery Voltage: 2.8 V
Brady Statistic AP VP Percent: 1 %
Brady Statistic AP VS Percent: 99 %
Brady Statistic AS VP Percent: 0 %
Brady Statistic AS VS Percent: 0 %
Date Time Interrogation Session: 20220119163912
Implantable Lead Implant Date: 20070706
Implantable Lead Implant Date: 20070706
Implantable Lead Location: 753859
Implantable Lead Location: 753860
Implantable Lead Model: 5076
Implantable Lead Model: 5092
Implantable Pulse Generator Implant Date: 20170113
Lead Channel Impedance Value: 418 Ohm
Lead Channel Impedance Value: 686 Ohm
Lead Channel Pacing Threshold Amplitude: 0.875 V
Lead Channel Pacing Threshold Amplitude: 1.375 V
Lead Channel Pacing Threshold Pulse Width: 0.4 ms
Lead Channel Pacing Threshold Pulse Width: 0.4 ms
Lead Channel Setting Pacing Amplitude: 1.875
Lead Channel Setting Pacing Amplitude: 2.75 V
Lead Channel Setting Pacing Pulse Width: 0.4 ms
Lead Channel Setting Sensing Sensitivity: 5.6 mV

## 2020-05-03 DIAGNOSIS — H5703 Miosis: Secondary | ICD-10-CM | POA: Diagnosis not present

## 2020-05-03 DIAGNOSIS — Z950 Presence of cardiac pacemaker: Secondary | ICD-10-CM | POA: Diagnosis not present

## 2020-05-03 DIAGNOSIS — I252 Old myocardial infarction: Secondary | ICD-10-CM | POA: Diagnosis not present

## 2020-05-03 DIAGNOSIS — H25012 Cortical age-related cataract, left eye: Secondary | ICD-10-CM | POA: Diagnosis not present

## 2020-05-03 DIAGNOSIS — Z951 Presence of aortocoronary bypass graft: Secondary | ICD-10-CM | POA: Diagnosis not present

## 2020-05-03 DIAGNOSIS — I2510 Atherosclerotic heart disease of native coronary artery without angina pectoris: Secondary | ICD-10-CM | POA: Diagnosis not present

## 2020-05-03 DIAGNOSIS — H2512 Age-related nuclear cataract, left eye: Secondary | ICD-10-CM | POA: Diagnosis not present

## 2020-05-04 ENCOUNTER — Telehealth: Payer: Self-pay

## 2020-05-04 DIAGNOSIS — H35372 Puckering of macula, left eye: Secondary | ICD-10-CM | POA: Diagnosis not present

## 2020-05-04 NOTE — Telephone Encounter (Signed)
Robert Burns and [employer] patient assistance application completed and faxed.  Per pt will mail him a copy of provider portion to complete.

## 2020-05-11 DIAGNOSIS — H25011 Cortical age-related cataract, right eye: Secondary | ICD-10-CM | POA: Diagnosis not present

## 2020-05-11 DIAGNOSIS — H2511 Age-related nuclear cataract, right eye: Secondary | ICD-10-CM | POA: Diagnosis not present

## 2020-05-11 NOTE — Progress Notes (Signed)
Remote pacemaker transmission.   

## 2020-05-17 DIAGNOSIS — H5703 Miosis: Secondary | ICD-10-CM | POA: Diagnosis not present

## 2020-05-17 DIAGNOSIS — I10 Essential (primary) hypertension: Secondary | ICD-10-CM | POA: Diagnosis not present

## 2020-05-17 DIAGNOSIS — H25011 Cortical age-related cataract, right eye: Secondary | ICD-10-CM | POA: Diagnosis not present

## 2020-05-17 DIAGNOSIS — Z87891 Personal history of nicotine dependence: Secondary | ICD-10-CM | POA: Diagnosis not present

## 2020-05-17 DIAGNOSIS — I2510 Atherosclerotic heart disease of native coronary artery without angina pectoris: Secondary | ICD-10-CM | POA: Diagnosis not present

## 2020-05-17 DIAGNOSIS — I252 Old myocardial infarction: Secondary | ICD-10-CM | POA: Diagnosis not present

## 2020-05-17 DIAGNOSIS — H2511 Age-related nuclear cataract, right eye: Secondary | ICD-10-CM | POA: Diagnosis not present

## 2020-05-17 DIAGNOSIS — Z951 Presence of aortocoronary bypass graft: Secondary | ICD-10-CM | POA: Diagnosis not present

## 2020-05-17 DIAGNOSIS — Z955 Presence of coronary angioplasty implant and graft: Secondary | ICD-10-CM | POA: Diagnosis not present

## 2020-05-20 DIAGNOSIS — E7800 Pure hypercholesterolemia, unspecified: Secondary | ICD-10-CM | POA: Diagnosis not present

## 2020-05-20 DIAGNOSIS — E1142 Type 2 diabetes mellitus with diabetic polyneuropathy: Secondary | ICD-10-CM | POA: Diagnosis not present

## 2020-05-20 DIAGNOSIS — F320 Major depressive disorder, single episode, mild: Secondary | ICD-10-CM | POA: Diagnosis not present

## 2020-05-20 DIAGNOSIS — N401 Enlarged prostate with lower urinary tract symptoms: Secondary | ICD-10-CM | POA: Diagnosis not present

## 2020-05-20 DIAGNOSIS — I10 Essential (primary) hypertension: Secondary | ICD-10-CM | POA: Diagnosis not present

## 2020-05-20 DIAGNOSIS — E1151 Type 2 diabetes mellitus with diabetic peripheral angiopathy without gangrene: Secondary | ICD-10-CM | POA: Diagnosis not present

## 2020-05-20 DIAGNOSIS — I480 Paroxysmal atrial fibrillation: Secondary | ICD-10-CM | POA: Diagnosis not present

## 2020-05-20 DIAGNOSIS — I25709 Atherosclerosis of coronary artery bypass graft(s), unspecified, with unspecified angina pectoris: Secondary | ICD-10-CM | POA: Diagnosis not present

## 2020-05-28 DIAGNOSIS — M25642 Stiffness of left hand, not elsewhere classified: Secondary | ICD-10-CM | POA: Diagnosis not present

## 2020-05-28 DIAGNOSIS — S62635D Displaced fracture of distal phalanx of left ring finger, subsequent encounter for fracture with routine healing: Secondary | ICD-10-CM | POA: Diagnosis not present

## 2020-05-28 DIAGNOSIS — S62613D Displaced fracture of proximal phalanx of left middle finger, subsequent encounter for fracture with routine healing: Secondary | ICD-10-CM | POA: Diagnosis not present

## 2020-05-28 DIAGNOSIS — S62612D Displaced fracture of proximal phalanx of right middle finger, subsequent encounter for fracture with routine healing: Secondary | ICD-10-CM | POA: Diagnosis not present

## 2020-06-01 DIAGNOSIS — H35372 Puckering of macula, left eye: Secondary | ICD-10-CM | POA: Diagnosis not present

## 2020-06-01 DIAGNOSIS — H35352 Cystoid macular degeneration, left eye: Secondary | ICD-10-CM | POA: Diagnosis not present

## 2020-06-01 DIAGNOSIS — H348112 Central retinal vein occlusion, right eye, stable: Secondary | ICD-10-CM | POA: Diagnosis not present

## 2020-06-07 ENCOUNTER — Other Ambulatory Visit: Payer: Self-pay | Admitting: Cardiovascular Disease

## 2020-06-08 NOTE — Telephone Encounter (Signed)
2m, 77.1kg, Creatinine, Serum 0.980 mg/ 06/19/2019, ccr 64, lovw/croitoru 02/26/20

## 2020-06-20 ENCOUNTER — Telehealth: Payer: Self-pay | Admitting: Student in an Organized Health Care Education/Training Program

## 2020-06-20 NOTE — Telephone Encounter (Signed)
Paged by operator for multiple complaints including severe HA, dizziness, vertigo, chest pressure, extreme fatigue and "running out of power/juice."  Last time he felt his normal self has been months ago. Vertigo he has had before, had this ~ 3 years ago. 2-3 days ago had several episodes of vertigo when he tried to stand from bed and had room spinning.   Currently has mild chest pressure. Not very severe, 2-3/10 severity. Has had similar chest pressure before. Pressure increases as activity increases. Usually stops if he stops activity. Chest pressure has been going on for several months. Has SLNG but never feels it is significant enough pain to take them for.   Lack of energy, excessive sleep, 10-11h per day. No energy despite this. All sx are the same but are becoming collectively worse. Feels that all the sx are getting worse. Feels that "he has a battery in him and it is slowly running down."   Has appointment with PCP tomorrow. He feels that he has been through this quite a few times. His energy level correlates with his prior anginal episodes.   After extensive discussion daughter said that patient is having sx c/w prior anginal episodes and she will bring him to the ED as he does not want to come to the ED. Agreed with plan. ED can assess with labwork/ECG and then determine whether he needs IP vs OP cardiac evaluation. I will update Dr. Gwenlyn Found.

## 2020-06-21 ENCOUNTER — Emergency Department (HOSPITAL_COMMUNITY): Payer: Medicare HMO

## 2020-06-21 ENCOUNTER — Inpatient Hospital Stay (HOSPITAL_COMMUNITY)
Admission: EM | Admit: 2020-06-21 | Discharge: 2020-06-24 | DRG: 247 | Disposition: A | Discharge status: Home / Self Care | Payer: Medicare HMO | Attending: Cardiovascular Disease | Admitting: Cardiovascular Disease

## 2020-06-21 ENCOUNTER — Other Ambulatory Visit: Payer: Self-pay

## 2020-06-21 ENCOUNTER — Encounter (HOSPITAL_COMMUNITY): Payer: Self-pay

## 2020-06-21 DIAGNOSIS — E785 Hyperlipidemia, unspecified: Secondary | ICD-10-CM | POA: Diagnosis not present

## 2020-06-21 DIAGNOSIS — I495 Sick sinus syndrome: Secondary | ICD-10-CM | POA: Diagnosis not present

## 2020-06-21 DIAGNOSIS — I25810 Atherosclerosis of coronary artery bypass graft(s) without angina pectoris: Secondary | ICD-10-CM | POA: Diagnosis not present

## 2020-06-21 DIAGNOSIS — Z91010 Allergy to peanuts: Secondary | ICD-10-CM

## 2020-06-21 DIAGNOSIS — G4733 Obstructive sleep apnea (adult) (pediatric): Secondary | ICD-10-CM | POA: Diagnosis present

## 2020-06-21 DIAGNOSIS — I2510 Atherosclerotic heart disease of native coronary artery without angina pectoris: Secondary | ICD-10-CM | POA: Diagnosis present

## 2020-06-21 DIAGNOSIS — I5042 Chronic combined systolic (congestive) and diastolic (congestive) heart failure: Secondary | ICD-10-CM | POA: Diagnosis present

## 2020-06-21 DIAGNOSIS — E1169 Type 2 diabetes mellitus with other specified complication: Secondary | ICD-10-CM | POA: Diagnosis present

## 2020-06-21 DIAGNOSIS — Z9989 Dependence on other enabling machines and devices: Secondary | ICD-10-CM

## 2020-06-21 DIAGNOSIS — M159 Polyosteoarthritis, unspecified: Secondary | ICD-10-CM | POA: Diagnosis present

## 2020-06-21 DIAGNOSIS — I739 Peripheral vascular disease, unspecified: Secondary | ICD-10-CM | POA: Diagnosis present

## 2020-06-21 DIAGNOSIS — E1151 Type 2 diabetes mellitus with diabetic peripheral angiopathy without gangrene: Secondary | ICD-10-CM | POA: Diagnosis not present

## 2020-06-21 DIAGNOSIS — Z7984 Long term (current) use of oral hypoglycemic drugs: Secondary | ICD-10-CM

## 2020-06-21 DIAGNOSIS — N400 Enlarged prostate without lower urinary tract symptoms: Secondary | ICD-10-CM | POA: Diagnosis present

## 2020-06-21 DIAGNOSIS — I152 Hypertension secondary to endocrine disorders: Secondary | ICD-10-CM | POA: Diagnosis not present

## 2020-06-21 DIAGNOSIS — I10 Essential (primary) hypertension: Secondary | ICD-10-CM | POA: Diagnosis present

## 2020-06-21 DIAGNOSIS — D72829 Elevated white blood cell count, unspecified: Secondary | ICD-10-CM | POA: Diagnosis not present

## 2020-06-21 DIAGNOSIS — I110 Hypertensive heart disease with heart failure: Secondary | ICD-10-CM | POA: Diagnosis present

## 2020-06-21 DIAGNOSIS — I25110 Atherosclerotic heart disease of native coronary artery with unstable angina pectoris: Secondary | ICD-10-CM | POA: Diagnosis not present

## 2020-06-21 DIAGNOSIS — I5023 Acute on chronic systolic (congestive) heart failure: Secondary | ICD-10-CM | POA: Diagnosis not present

## 2020-06-21 DIAGNOSIS — Z7901 Long term (current) use of anticoagulants: Secondary | ICD-10-CM | POA: Diagnosis not present

## 2020-06-21 DIAGNOSIS — Z8249 Family history of ischemic heart disease and other diseases of the circulatory system: Secondary | ICD-10-CM | POA: Diagnosis not present

## 2020-06-21 DIAGNOSIS — E7800 Pure hypercholesterolemia, unspecified: Secondary | ICD-10-CM | POA: Diagnosis present

## 2020-06-21 DIAGNOSIS — Z955 Presence of coronary angioplasty implant and graft: Secondary | ICD-10-CM

## 2020-06-21 DIAGNOSIS — I480 Paroxysmal atrial fibrillation: Secondary | ICD-10-CM | POA: Diagnosis present

## 2020-06-21 DIAGNOSIS — E118 Type 2 diabetes mellitus with unspecified complications: Secondary | ICD-10-CM | POA: Diagnosis present

## 2020-06-21 DIAGNOSIS — Z833 Family history of diabetes mellitus: Secondary | ICD-10-CM | POA: Diagnosis not present

## 2020-06-21 DIAGNOSIS — Z87891 Personal history of nicotine dependence: Secondary | ICD-10-CM | POA: Diagnosis not present

## 2020-06-21 DIAGNOSIS — E1159 Type 2 diabetes mellitus with other circulatory complications: Secondary | ICD-10-CM | POA: Diagnosis not present

## 2020-06-21 DIAGNOSIS — Z79899 Other long term (current) drug therapy: Secondary | ICD-10-CM

## 2020-06-21 DIAGNOSIS — Z83438 Family history of other disorder of lipoprotein metabolism and other lipidemia: Secondary | ICD-10-CM | POA: Diagnosis not present

## 2020-06-21 DIAGNOSIS — Z85820 Personal history of malignant melanoma of skin: Secondary | ICD-10-CM | POA: Diagnosis not present

## 2020-06-21 DIAGNOSIS — I25710 Atherosclerosis of autologous vein coronary artery bypass graft(s) with unstable angina pectoris: Secondary | ICD-10-CM | POA: Diagnosis not present

## 2020-06-21 DIAGNOSIS — Z950 Presence of cardiac pacemaker: Secondary | ICD-10-CM | POA: Diagnosis not present

## 2020-06-21 DIAGNOSIS — Z20822 Contact with and (suspected) exposure to covid-19: Secondary | ICD-10-CM | POA: Diagnosis not present

## 2020-06-21 DIAGNOSIS — E1165 Type 2 diabetes mellitus with hyperglycemia: Secondary | ICD-10-CM | POA: Diagnosis not present

## 2020-06-21 DIAGNOSIS — I200 Unstable angina: Secondary | ICD-10-CM

## 2020-06-21 DIAGNOSIS — R079 Chest pain, unspecified: Secondary | ICD-10-CM | POA: Diagnosis not present

## 2020-06-21 DIAGNOSIS — G2581 Restless legs syndrome: Secondary | ICD-10-CM | POA: Diagnosis present

## 2020-06-21 DIAGNOSIS — Z888 Allergy status to other drugs, medicaments and biological substances status: Secondary | ICD-10-CM

## 2020-06-21 LAB — BASIC METABOLIC PANEL
Anion gap: 10 (ref 5–15)
BUN: 25 mg/dL — ABNORMAL HIGH (ref 8–23)
CO2: 22 mmol/L (ref 22–32)
Calcium: 9.9 mg/dL (ref 8.9–10.3)
Chloride: 107 mmol/L (ref 98–111)
Creatinine, Ser: 0.96 mg/dL (ref 0.61–1.24)
GFR, Estimated: >60 mL/min (ref >60)
Glucose, Bld: 112 mg/dL — ABNORMAL HIGH (ref 70–99)
Potassium: 4 mmol/L (ref 3.5–5.1)
Sodium: 139 mmol/L (ref 135–145)

## 2020-06-21 LAB — TROPONIN I (HIGH SENSITIVITY)
Troponin I (High Sensitivity): 7 ng/L (ref <18)
Troponin I (High Sensitivity): 6 ng/L (ref <18)

## 2020-06-21 LAB — GLUCOSE, CAPILLARY
Glucose-Capillary: 164 mg/dL — ABNORMAL HIGH (ref 70–99)
Glucose-Capillary: 124 mg/dL — ABNORMAL HIGH (ref 70–99)

## 2020-06-21 LAB — CBC
HCT: 41.1 % (ref 39.0–52.0)
Hemoglobin: 13.3 g/dL (ref 13.0–17.0)
MCH: 27.9 pg (ref 26.0–34.0)
MCHC: 32.4 g/dL (ref 30.0–36.0)
MCV: 86.2 fL (ref 80.0–100.0)
Platelets: 167 10*3/uL (ref 150–400)
RBC: 4.77 MIL/uL (ref 4.22–5.81)
RDW: 14.6 % (ref 11.5–15.5)
WBC: 18 10*3/uL — ABNORMAL HIGH (ref 4.0–10.5)
nRBC: 0 % (ref 0.0–0.2)

## 2020-06-21 LAB — HEMOGLOBIN A1C
Hgb A1c MFr Bld: 8 % — ABNORMAL HIGH (ref 4.8–5.6)
Mean Plasma Glucose: 182.9 mg/dL

## 2020-06-21 LAB — PROTIME-INR
INR: 2.1 — ABNORMAL HIGH (ref 0.8–1.2)
Prothrombin Time: 22.9 seconds — ABNORMAL HIGH (ref 11.4–15.2)

## 2020-06-21 LAB — SARS CORONAVIRUS 2 (TAT 6-24 HRS): SARS Coronavirus 2: NEGATIVE

## 2020-06-21 LAB — MRSA PCR SCREENING: MRSA by PCR: POSITIVE — AB

## 2020-06-21 MED ORDER — ATORVASTATIN CALCIUM 80 MG PO TABS
80.0000 mg | ORAL_TABLET | Freq: Every day | ORAL | Status: DC
  Administered 2020-06-21 – 2020-06-23 (×3): 80 mg via ORAL
  Filled 2020-06-21: qty 2
  Filled 2020-06-21 (×2): qty 1

## 2020-06-21 MED ORDER — ROPINIROLE HCL 0.5 MG PO TABS
1.0000 mg | ORAL_TABLET | Freq: Every day | ORAL | Status: DC
  Administered 2020-06-21 – 2020-06-24 (×4): 1 mg via ORAL
  Filled 2020-06-21: qty 1
  Filled 2020-06-21: qty 2
  Filled 2020-06-21: qty 1
  Filled 2020-06-21: qty 2

## 2020-06-21 MED ORDER — TAMSULOSIN HCL 0.4 MG PO CAPS
0.4000 mg | ORAL_CAPSULE | Freq: Two times a day (BID) | ORAL | Status: DC
  Administered 2020-06-21 – 2020-06-24 (×6): 0.4 mg via ORAL
  Filled 2020-06-21 (×6): qty 1

## 2020-06-21 MED ORDER — INSULIN ASPART 100 UNIT/ML ~~LOC~~ SOLN
4.0000 [IU] | Freq: Three times a day (TID) | SUBCUTANEOUS | Status: DC
  Administered 2020-06-21 – 2020-06-24 (×6): 4 [IU] via SUBCUTANEOUS

## 2020-06-21 MED ORDER — ASPIRIN 81 MG PO CHEW
81.0000 mg | CHEWABLE_TABLET | ORAL | Status: AC
  Administered 2020-06-22: 81 mg via ORAL
  Filled 2020-06-21: qty 1

## 2020-06-21 MED ORDER — HEPARIN (PORCINE) 25000 UT/250ML-% IV SOLN
1200.0000 [IU]/h | INTRAVENOUS | Status: DC
  Administered 2020-06-21: 1100 [IU]/h via INTRAVENOUS
  Filled 2020-06-21 (×3): qty 250

## 2020-06-21 MED ORDER — SODIUM CHLORIDE 0.9 % IV SOLN: 250.0000 mL | INTRAVENOUS | Status: DC | PRN

## 2020-06-21 MED ORDER — NITROGLYCERIN 2 % TD OINT
1.0000 [in_us] | TOPICAL_OINTMENT | Freq: Once | TRANSDERMAL | Status: AC
  Administered 2020-06-21: 1 [in_us] via TOPICAL
  Filled 2020-06-21: qty 1

## 2020-06-21 MED ORDER — METFORMIN HCL 500 MG PO TABS: 1000.0000 mg | ORAL_TABLET | Freq: Two times a day (BID) | ORAL | Status: DC

## 2020-06-21 MED ORDER — RANOLAZINE ER 500 MG PO TB12
500.0000 mg | ORAL_TABLET | Freq: Two times a day (BID) | ORAL | Status: DC
  Administered 2020-06-21 – 2020-06-24 (×7): 500 mg via ORAL
  Filled 2020-06-21 (×9): qty 1

## 2020-06-21 MED ORDER — CHLORHEXIDINE GLUCONATE CLOTH 2 % EX PADS
6.0000 | MEDICATED_PAD | Freq: Every day | CUTANEOUS | Status: DC
  Administered 2020-06-22 – 2020-06-24 (×2): 6 via TOPICAL

## 2020-06-21 MED ORDER — ISOSORBIDE MONONITRATE ER 60 MG PO TB24
60.0000 mg | ORAL_TABLET | Freq: Every day | ORAL | Status: DC
  Administered 2020-06-21 – 2020-06-24 (×4): 60 mg via ORAL
  Filled 2020-06-21: qty 1
  Filled 2020-06-21: qty 2
  Filled 2020-06-21 (×2): qty 1

## 2020-06-21 MED ORDER — VITAMIN D 25 MCG (1000 UNIT) PO TABS
2000.0000 [IU] | ORAL_TABLET | Freq: Two times a day (BID) | ORAL | Status: DC
  Administered 2020-06-21 – 2020-06-24 (×6): 2000 [IU] via ORAL
  Filled 2020-06-21 (×6): qty 2

## 2020-06-21 MED ORDER — SODIUM CHLORIDE 0.9 % WEIGHT BASED INFUSION: 1.0000 mL/kg/h | INTRAVENOUS | Status: DC

## 2020-06-21 MED ORDER — AMLODIPINE BESYLATE 10 MG PO TABS
10.0000 mg | ORAL_TABLET | Freq: Every day | ORAL | Status: DC
  Administered 2020-06-21 – 2020-06-24 (×4): 10 mg via ORAL
  Filled 2020-06-21: qty 1
  Filled 2020-06-21: qty 2
  Filled 2020-06-21 (×2): qty 1

## 2020-06-21 MED ORDER — NEOMYCIN-POLYMYXIN-HC 3.5-10000-1 OT SUSP: 3.0000 [drp] | Freq: Three times a day (TID) | OTIC | Status: DC

## 2020-06-21 MED ORDER — METOPROLOL TARTRATE 100 MG PO TABS
100.0000 mg | ORAL_TABLET | Freq: Two times a day (BID) | ORAL | Status: DC
  Administered 2020-06-21 – 2020-06-24 (×7): 100 mg via ORAL
  Filled 2020-06-21: qty 2
  Filled 2020-06-21 (×2): qty 1
  Filled 2020-06-21: qty 2
  Filled 2020-06-21 (×2): qty 1
  Filled 2020-06-21: qty 4

## 2020-06-21 MED ORDER — HYDROCHLOROTHIAZIDE 25 MG PO TABS
25.0000 mg | ORAL_TABLET | ORAL | Status: DC
  Administered 2020-06-21: 25 mg via ORAL
  Filled 2020-06-21: qty 1

## 2020-06-21 MED ORDER — ASPIRIN 81 MG PO CHEW
324.0000 mg | CHEWABLE_TABLET | Freq: Once | ORAL | Status: AC
  Administered 2020-06-21: 324 mg via ORAL
  Filled 2020-06-21: qty 4

## 2020-06-21 MED ORDER — INSULIN ASPART 100 UNIT/ML ~~LOC~~ SOLN
0.0000 [IU] | Freq: Three times a day (TID) | SUBCUTANEOUS | Status: DC
  Administered 2020-06-21: 3 [IU] via SUBCUTANEOUS
  Administered 2020-06-22 – 2020-06-23 (×3): 2 [IU] via SUBCUTANEOUS
  Administered 2020-06-24: 3 [IU] via SUBCUTANEOUS

## 2020-06-21 MED ORDER — SODIUM CHLORIDE 0.9% FLUSH
3.0000 mL | Freq: Two times a day (BID) | INTRAVENOUS | Status: DC
  Administered 2020-06-21: 3 mL via INTRAVENOUS

## 2020-06-21 MED ORDER — MUPIROCIN 2 % EX OINT
1.0000 "application " | TOPICAL_OINTMENT | Freq: Two times a day (BID) | CUTANEOUS | Status: DC
  Administered 2020-06-21 – 2020-06-24 (×6): 1 via NASAL
  Filled 2020-06-21 (×2): qty 22

## 2020-06-21 MED ORDER — NITROGLYCERIN 0.4 MG SL SUBL: 0.4000 mg | SUBLINGUAL_TABLET | SUBLINGUAL | Status: DC | PRN

## 2020-06-21 MED ORDER — GLIPIZIDE 5 MG PO TABS
5.0000 mg | ORAL_TABLET | Freq: Every day | ORAL | Status: DC
  Administered 2020-06-21: 5 mg via ORAL
  Filled 2020-06-21: qty 1

## 2020-06-21 MED ORDER — ASPIRIN 81 MG PO CHEW
81.0000 mg | CHEWABLE_TABLET | Freq: Every day | ORAL | Status: DC
  Administered 2020-06-21 – 2020-06-24 (×3): 81 mg via ORAL
  Filled 2020-06-21 (×3): qty 1

## 2020-06-21 MED ORDER — NITROGLYCERIN 0.4 MG SL SUBL
0.4000 mg | SUBLINGUAL_TABLET | SUBLINGUAL | Status: DC | PRN
  Filled 2020-06-21: qty 1

## 2020-06-21 MED ORDER — SODIUM CHLORIDE 0.9 % WEIGHT BASED INFUSION
3.0000 mL/kg/h | INTRAVENOUS | Status: DC
  Administered 2020-06-22: 3 mL/kg/h via INTRAVENOUS

## 2020-06-21 MED ORDER — POTASSIUM CHLORIDE CRYS ER 20 MEQ PO TBCR
20.0000 meq | EXTENDED_RELEASE_TABLET | Freq: Every day | ORAL | Status: DC
  Administered 2020-06-21 – 2020-06-24 (×4): 20 meq via ORAL
  Filled 2020-06-21 (×4): qty 1

## 2020-06-21 MED ORDER — SODIUM CHLORIDE 0.9% FLUSH: 3.0000 mL | INTRAVENOUS | Status: DC | PRN

## 2020-06-21 MED ORDER — HYDRALAZINE HCL 25 MG PO TABS
25.0000 mg | ORAL_TABLET | Freq: Two times a day (BID) | ORAL | Status: DC
  Administered 2020-06-21 – 2020-06-24 (×7): 25 mg via ORAL
  Filled 2020-06-21 (×8): qty 1

## 2020-06-21 MED ORDER — INSULIN ASPART 100 UNIT/ML ~~LOC~~ SOLN
0.0000 [IU] | Freq: Every day | SUBCUTANEOUS | Status: DC
  Administered 2020-06-23: 2 [IU] via SUBCUTANEOUS

## 2020-06-21 NOTE — ED Notes (Signed)
Cardiologist was at bedside, stated most likely to cath pt tmrw morning. Diet placed

## 2020-06-21 NOTE — Progress Notes (Signed)
ANTICOAGULATION CONSULT NOTE - Initial Consult  Pharmacy Consult for Heparin Indication: chest pain/ACS  Allergies  Allergen Reactions  . Peanut-Containing Drug Products Anaphylaxis and Other (See Comments)    Tongue swelling is severe   . Ace Inhibitors Hives, Rash and Cough       . Diltiazem Hcl Other (See Comments)    UNSPECIFIED REACTION    . Meloxicam Other (See Comments)    GI upset GI BLEED   . Eliquis [Apixaban] Other (See Comments)    dizzyness  . Carvedilol Itching  . Clonidine Hcl Other (See Comments)    Patch only - skin irritation  . Duloxetine Hcl Other (See Comments)    Urinary frequency    Patient Measurements: Height: 5\' 7"  (170.2 cm) Weight: 77.1 kg (170 lb) IBW/kg (Calculated) : 66.1  Vital Signs: Temp: 97.6 F (36.4 C) (03/14 0018) Temp Source: Oral (03/14 0018) BP: 126/66 (03/14 0230) Pulse Rate: 78 (03/14 0018)  Labs: Recent Labs    06/21/20 0102  HGB 13.3  HCT 41.1  PLT 167  CREATININE 0.96  TROPONINIHS 7    Estimated Creatinine Clearance: 56.4 mL/min (by C-G formula based on SCr of 0.96 mg/dL).   Medical History: Past Medical History:  Diagnosis Date  . Arthritis    "all over" (11/25/2015)  . CAD (coronary artery disease)    a. s/p CABG  06/2002; b. 02/01/15 PCI: DES to prox SVG to PDA, staged PCI of SVG to Diag in 02/2015; c. 04/2017 Cath/PCI: LM nl, LAD 100ost, 15m, 75d, LCX 60ost, OM2 80, RCA 100ost, RPDA 80, LIMA->LAD ok, VG->D1 patent stent, VG->RPDA patent stent, 50p, VG->OM1->OM2 90p (3.0x24 Synergy DES), 100 between OM1->OM2 (med rx).  . Cancer ([place])    Right Shoulder, Left Leg- BCC, SCC, AND MELANOMA  . Epithelioid hemangioendothelioma    s/p resection of left SFA/mass with interposition 6 mm GoreTex graft 06/02/10, s/p repeat resection for positive margins 09/22/10  . Hyperlipidemia   . Hypertension   . Leg pain   . OSA on CPAP   . PAD (peripheral artery disease) ([place])    a. 10/2002 L SFA PTA/BMS; b. 8/17 LE Angio:  LEIA 90 (9x40 self exp stent), LSFA short segment prox occlusion (staged PTA/stenting 01/03/2016), patent mid stent, RSFA 52m (staged PTA/DEB 02/14/2016).  . Presence of permanent cardiac pacemaker    Medtronic  . SSS (sick sinus syndrome) ([place])    a. s/p PPM in 2007 with gen change 04/2015 - Medtronic Adapta ADDRL1, ser # [id number].  . Type II diabetes mellitus ([place])    Type II  . Urgency of urination     Medications:  No current facility-administered medications on file prior to encounter.   Current Outpatient Medications on File Prior to Encounter  Medication Sig Dispense Refill  . amLODipine (NORVASC) 10 MG tablet Take 10 mg by mouth daily.     [name] amoxicillin (AMOXIL) 875 MG tablet Take one tab PO Q12hr 14 tablet 0  . atorvastatin (LIPITOR) 80 MG tablet Take 1 tablet (80 mg total) by mouth daily at 6 PM. 90 tablet 1  . Cholecalciferol (VITAMIN D3) 2000 units capsule Take 2,000 Units by mouth 2 (two) times daily.    [name] glipiZIDE (GLUCOTROL) 5 MG tablet Take 5 mg by mouth daily with breakfast.     . hydrALAZINE (APRESOLINE) 25 MG tablet Take 25 mg by mouth 2 (two) times daily.     . hydrochlorothiazide (HYDRODIURIL) 25 MG tablet Take 25 mg by mouth every morning.    [name]  isosorbide mononitrate (IMDUR) 60 MG 24 hr tablet TAKE 1 TABLET EVERY DAY 90 tablet 2  . melatonin 5 MG TABS 1 tablet in the evening    . metFORMIN (GLUCOPHAGE) 1000 MG tablet Take 1 tablet (1,000 mg total) by mouth 2 (two) times daily with a meal.    . metoprolol tartrate (LOPRESSOR) 100 MG tablet Take 1 tablet (100 mg total) by mouth 2 (two) times daily. 180 tablet 3  . MULTIPLE VITAMIN-FOLIC ACID PO 1 tablet    . neomycin-polymyxin-hydrocortisone (CORTISPORIN) 3.5-10000-1 OTIC suspension Place 3 drops into both ears 3 (three) times daily. 7.5 mL 0  . nitroGLYCERIN (NITROSTAT) 0.4 MG SL tablet Place 1 tablet (0.4 mg total) under the tongue every 5 (five) minutes as needed for chest pain. 25 tablet 3  . potassium chloride SA  (K-DUR,KLOR-CON) 20 MEQ tablet Take 1 tablet (20 mEq total) by mouth daily. 15 tablet 0  . rOPINIRole (REQUIP) 1 MG tablet Take 1 mg by mouth daily.    . tamsulosin (FLOMAX) 0.4 MG CAPS capsule Take 0.4 mg by mouth 2 (two) times daily.     Alveda Reasons 20 MG TABS tablet TAKE 1 TABLET (20 MG TOTAL) BY MOUTH DAILY WITH SUPPER. 90 tablet 1     Assessment: 80 y.o. male admitted with chest pain, h/o Afib and Xarelto on hold, for heparin. Last dose of Xarelto taken ~ 9 pm 3/13  Goal of Therapy:  APTT 66-102 sec Heparin level 0.3-0.7 units/ml Monitor platelets by anticoagulation protocol: Yes   Plan:  Start heparin 1100 units/hr at 8 pm tonight Follow-up am labs tomorrow morning.  Caryl Pina 06/21/2020,3:02 AM

## 2020-06-21 NOTE — H&P (Addendum)
Cardiology Admission History and Physical:   Patient ID: AADAN CHENIER MRN: [id number]; DOB: 11-09-1940   Admission date: 06/21/2020  Primary Care Provider: [name], MD [facility] Cardiologist: Quay Burow, MD  [facility] Electrophysiologist:  Sanda Klein, MD   Chief Complaint: chest pressure  Patient Profile:   80M with CAD s/p CABG (2004), multiple PCI (SVG-PDA/SVG-diag 2016, x2 to LAD, ISR of SVG-diag/SVG-OM, 2019, SVG-PDA/SVG-diag, 2020), PAD, prior HFrEF with now improved EF, HTN, HLD, OSA, and DM2 who presents with recurrent chest pressure.   History of Present Illness:   Mr. Cush called overnight with several complaints including chest pressure, vertigo, and general malaise.  After extensive discussion his daughter said that some of his symptoms are fairly consistent with prior anginal equivalents.  He has extensive coronary history with multiple interventions and prior CABG.  Recommended that he have evaluation emergency department for further evaluate his symptoms.  ED evaluation was overall unrevealing with ECG with no ischemic changes and troponins ruled out.  Mr. Tamala Julian is provides any very tangential history regarding his anginal equivalents.  Overall he thinks that he has had worsening chest pressure over the past few months but that is increased dramatically in severity and frequency over the past weeks to month.  He is now having chest pressure 3-4 times per day and is progressed to occasionally occur at rest.  On arrival to the emergency department he was having 3-4/10 severity chest pressure without radiation although sometimes it does radiate to his neck.  He had this chest pressure since 2200.  He went to feed his dogs when he noticed a gradual onset chest pressure starting her.  The first episode he had yesterday was after eating breakfast.  He says usually when he sits down it will get less severe but that he always feels with some baseline chest pressure  of mild severity.  He is on low-dose Imdur but does not take as needed nitroglycerin.  He was last seen by Dr. Gwenlyn Found in October 2021 at which point he had minimal anginal symptoms per chart review.  He says however that he may have had more chest pressure that he had recorded because he thought it was a chest pain which was an issue.  His physical activity is fairly restricted as he is exhausted and chronically fatigued.  He sleeps a significant portion of the day but never feels refreshed.  The most severe his chest pressure is, is 5/10 in the past month.  It usually occurs for 5 to 15 minutes for going away overcoming a severe.  He denies any shortness of breath, orthopnea, PND or significant lower extremity swelling.  He had significant provement in his chest pressure after nitro patch was applied in the ED.  He is on Xarelto for atrial fibrillation with last dose on 03/13.  Past Medical History:  Diagnosis Date  . Arthritis    "all over" (11/25/2015)  . CAD (coronary artery disease)    a. s/p CABG  06/2002; b. 02/01/15 PCI: DES to prox SVG to PDA, staged PCI of SVG to Diag in 02/2015; c. 04/2017 Cath/PCI: LM nl, LAD 100ost, 33m, 75d, LCX 60ost, OM2 80, RCA 100ost, RPDA 80, LIMA->LAD ok, VG->D1 patent stent, VG->RPDA patent stent, 50p, VG->OM1->OM2 90p (3.0x24 Synergy DES), 100 between OM1->OM2 (med rx).  . Cancer ([place])    Right Shoulder, Left Leg- BCC, SCC, AND MELANOMA  . Epithelioid hemangioendothelioma    s/p resection of left SFA/mass with interposition 6 mm GoreTex  graft 06/02/10, s/p repeat resection for positive margins 09/22/10  . Hyperlipidemia   . Hypertension   . Leg pain   . OSA on CPAP   . PAD (peripheral artery disease) ([place])    a. 10/2002 L SFA PTA/BMS; b. 8/17 LE Angio: LEIA 90 (9x40 self exp stent), LSFA short segment prox occlusion (staged PTA/stenting 01/03/2016), patent mid stent, RSFA 59m (staged PTA/DEB 02/14/2016).  . Presence of permanent cardiac pacemaker    Medtronic  . SSS  (sick sinus syndrome) ([place])    a. s/p PPM in 2007 with gen change 04/2015 - Medtronic Adapta ADDRL1, ser # [id number] H.  . Type II diabetes mellitus ([place])    Type II  . Urgency of urination    Past Surgical History:  Procedure Laterality Date  . CARDIAC CATHETERIZATION N/A 02/01/2015   Procedure: Left Heart Cath and Coronary Angiography;  Surgeon: Lorretta Harp, MD;  Location: [place] [facility];  Service: Cardiovascular;  Laterality: N/A;  . CARDIAC CATHETERIZATION N/A 02/01/2015   Procedure: Coronary Stent Intervention;  Surgeon: Lorretta Harp, MD;  Location: [place] [facility];  Service: Cardiovascular;  Laterality: N/A;  . CARDIAC CATHETERIZATION  06/2002   "just before bypass OR"  . CARDIAC CATHETERIZATION N/A 03/01/2015   Procedure: Coronary Stent Intervention;  Surgeon: Lorretta Harp, MD;  Location: [place] [facility];  Service: Cardiovascular;  Laterality: N/A;  . CARDIAC CATHETERIZATION  02/06/2018  . COLONOSCOPY    . CORONARY ANGIOPLASTY    . CORONARY ARTERY BYPASS GRAFT  06/2002   x5, LIMA-LAD;VG- Diag; seq VG- ramus & OM branch; VG-PDA  . CORONARY STENT INTERVENTION N/A 04/12/2017   Procedure: CORONARY STENT INTERVENTION;  Surgeon: Lorretta Harp, MD;  Location: [place] [facility];  Service: Cardiovascular;  Laterality: N/A;  . CORONARY STENT INTERVENTION N/A 02/08/2018   Procedure: CORONARY STENT INTERVENTION;  Surgeon: Jettie Booze, MD;  Location: [place] [facility];  Service: Cardiovascular;  Laterality: N/A;  . CORONARY STENT INTERVENTION N/A 11/12/2018   Procedure: CORONARY STENT INTERVENTION;  Surgeon: Wellington Hampshire, MD;  Location: [place] [facility];  Service: Cardiovascular;  Laterality: N/A;  . EP IMPLANTABLE DEVICE N/A 04/23/2015   Procedure: PPM Generator Changeout;  Surgeon: Sanda Klein, MD;  Location: [place] [facility];  Service: Cardiovascular;  Laterality: N/A;  . FALSE ANEURYSM REPAIR Left 11/29/2018   Procedure: REPAIR FALSE ANEURYSM LEFT  RADIAL ARTERY;  Surgeon: [name], MD;  Location: [place];  Service: Vascular;  Laterality: Left;  . FEMORAL ARTERY STENT Left ~ 2014   "taken out of my leg; couldn' catorgorize what kind so the put it under all 3"; cataroziepitheloid hemanioendotheliomau  . FOOT FRACTURE SURGERY Left 1973  . FRACTURE SURGERY    . INSERT / REPLACE / REMOVE PACEMAKER  10/13/05   right side, medtronic Adapta  . KNEE HARDWARE REMOVAL Right 1950's   "3-4 months after the insertion"  . KNEE SURGERY Right 1950's   "broke my lower leg; had to put pin in my knee to keep lower leg in place til it healed"  . LEFT HEART CATH AND CORS/GRAFTS ANGIOGRAPHY N/A 04/12/2017   Procedure: LEFT HEART CATH AND CORS/GRAFTS ANGIOGRAPHY;  Surgeon: Lorretta Harp, MD;  Location: [place] [facility];  Service: Cardiovascular;  Laterality: N/A;  . LEFT HEART CATH AND CORS/GRAFTS ANGIOGRAPHY N/A 02/06/2018   Procedure: LEFT HEART CATH AND CORS/GRAFTS ANGIOGRAPHY;  Surgeon: Troy Sine, MD;  Location: [place] [facility];  Service: Cardiovascular;  Laterality: N/A;  . LEFT HEART CATH AND CORS/GRAFTS ANGIOGRAPHY N/A 11/12/2018   Procedure: LEFT HEART CATH AND CORS/GRAFTS ANGIOGRAPHY;  Surgeon: Wellington Hampshire, MD;  Location: [place] [facility];  Service: Cardiovascular;  Laterality: N/A;  . PERIPHERAL VASCULAR CATHETERIZATION N/A 11/25/2015   Procedure: Lower Extremity Angiography;  Surgeon: Lorretta Harp, MD;  Location: [place] [facility];  Service: Cardiovascular;  Laterality: N/A;  . PERIPHERAL VASCULAR CATHETERIZATION Left 11/25/2015   Procedure: Peripheral Vascular Intervention;  Surgeon: Lorretta Harp, MD;  Location: [place] [facility];  Service: Cardiovascular;  Laterality: Left;  external iliac  . PERIPHERAL VASCULAR CATHETERIZATION N/A 01/03/2016   Procedure: Lower Extremity Angiography;  Surgeon: Lorretta Harp, MD;  Location: [place] [facility];  Service: Cardiovascular;  Laterality: N/A;  . PERIPHERAL VASCULAR  CATHETERIZATION Left 01/03/2016   Procedure: Peripheral Vascular Intervention;  Surgeon: Lorretta Harp, MD;  Location: [place] [facility];  Service: Cardiovascular;  Laterality: Left;  SFA  . PERIPHERAL VASCULAR CATHETERIZATION Right 02/14/2016   Procedure: Peripheral Vascular Atherectomy;  Surgeon: Lorretta Harp, MD;  Location: [place] [facility];  Service: Cardiovascular;  Laterality: Right;  SFA  . POPLITEAL ARTERY STENT  01/03/2016   Contralateral access with a 7 French crossover sheath (second order catheter placement)  . TONSILLECTOMY AND ADENOIDECTOMY    . TUMOR EXCISION Right ~ 2005   cancerous tumor removed from shoulder  . TUMOR EXCISION Right ~ 2000   benign tumor removed from under shoulder  . TUMOR EXCISION Left 06/02/2010   resection of Lt SFA wth interposition of Gore-Tex graft    Medications Prior to Admission: Prior to Admission medications   Medication Sig Start Date End Date Taking? Authorizing Provider  amLODipine (NORVASC) 10 MG tablet Take 10 mg by mouth daily.     [provider]  amoxicillin (AMOXIL) 875 MG tablet Take one tab PO Q12hr 02/01/20   Kandra Nicolas, MD  atorvastatin (LIPITOR) 80 MG tablet Take 1 tablet (80 mg total) by mouth daily at 6 PM. 02/09/18   Eileen Stanford, PA-C  Cholecalciferol (VITAMIN D3) 2000 units capsule Take 2,000 Units by mouth 2 (two) times daily.    [provider]  glipiZIDE (GLUCOTROL) 5 MG tablet Take 5 mg by mouth daily with breakfast.  01/10/18   [provider]  hydrALAZINE (APRESOLINE) 25 MG tablet Take 25 mg by mouth 2 (two) times daily.     [provider]  hydrochlorothiazide (HYDRODIURIL) 25 MG tablet Take 25 mg by mouth every morning.    [provider]  isosorbide mononitrate (IMDUR) 60 MG 24 hr tablet TAKE 1 TABLET EVERY DAY 12/01/19   Lorretta Harp, MD  melatonin 5 MG TABS 1 tablet in the evening    [provider]  metFORMIN (GLUCOPHAGE) 1000 MG tablet  Take 1 tablet (1,000 mg total) by mouth 2 (two) times daily with a meal. 02/10/18   Eileen Stanford, PA-C  metoprolol tartrate (LOPRESSOR) 100 MG tablet Take 1 tablet (100 mg total) by mouth 2 (two) times daily. 08/07/17   Lendon Colonel, NP  MULTIPLE VITAMIN-FOLIC ACID PO 1 tablet    [provider]  neomycin-polymyxin-hydrocortisone (CORTISPORIN) 3.5-10000-1 OTIC suspension Place 3 drops into both ears 3 (three) times daily. 02/01/20   Kandra Nicolas, MD  nitroGLYCERIN (NITROSTAT) 0.4 MG SL tablet Place 1 tablet (0.4 mg total) under the tongue every 5 (five) minutes as needed for chest pain.  04/15/17   Theora Gianotti, NP  potassium chloride SA (K-DUR,KLOR-CON) 20 MEQ tablet Take 1 tablet (20 mEq total) by mouth daily. 02/18/16   Regalado, Belkys A, MD  rOPINIRole (REQUIP) 1 MG tablet Take 1 mg by mouth daily.    [provider]  tamsulosin (FLOMAX) 0.4 MG CAPS capsule Take 0.4 mg by mouth 2 (two) times daily.  03/09/15   [provider]  XARELTO 20 MG TABS tablet TAKE 1 TABLET (20 MG TOTAL) BY MOUTH DAILY WITH SUPPER. 06/08/20   Croitoru, Dani Gobble, MD    Allergies:    Allergies  Allergen Reactions  . Peanut-Containing Drug Products Anaphylaxis and Other (See Comments)    Tongue swelling is severe   . Ace Inhibitors Hives, Rash and Cough       . Diltiazem Hcl Other (See Comments)    UNSPECIFIED REACTION    . Meloxicam Other (See Comments)    GI upset GI BLEED   . Eliquis [Apixaban] Other (See Comments)    dizzyness  . Carvedilol Itching  . Clonidine Hcl Other (See Comments)    Patch only - skin irritation  . Duloxetine Hcl Other (See Comments)    Urinary frequency   Social History:   Social History   Socioeconomic History  . Marital status: Married    Spouse name: Not on file  . Number of children: Not on file  . Years of education: Not on file  . Highest education level: Not on file  Occupational History  . Not on file  Tobacco  Use  . Smoking status: Former Smoker    Years: 10.00    Types: Pipe    Quit date: 1973    Years since quitting: 49.2  . Smokeless tobacco: Never Used  . Tobacco comment: "quit smoking in 1973"  Vaping Use  . Vaping Use: Never used  Substance and Sexual Activity  . Alcohol use: No  . Drug use: No  . Sexual activity: Not Currently  Other Topics Concern  . Not on file  Social History Narrative  . Not on file   Social Determinants of Health   Financial Resource Strain: Not on file  Food Insecurity: Not on file  Transportation Needs: Not on file  Physical Activity: Not on file  Stress: Not on file  Social Connections: Not on file  Intimate Partner Violence: Not on file    Family History:   The patient's family history includes Coronary artery disease in his mother; Diabetes in his father and sister; Heart attack in his father and mother; Heart disease in his father, mother, and sister; Hyperlipidemia in his father; Hypertension in his mother and sister.    ROS:   Review of Systems: [y] = yes, [ ]  = no       General: Weight gain [ ] ; Weight loss [ ] ; Anorexia [ ] ; Fatigue [ ] ; Fever [ ] ; Chills [ ] ; Weakness [ ]     Cardiac: Chest pain/pressure [y]; Resting SOB [ ] ; Exertional SOB [ ] ; Orthopnea [ ] ; Pedal Edema [ ] ; Palpitations [ ] ; Syncope [ ] ; Presyncope [ ] ; Paroxysmal nocturnal dyspnea [ ]     Pulmonary: Cough [ ] ; Wheezing [ ] ; Hemoptysis [ ] ; Sputum [ ] ; Snoring [ ]     GI: Vomiting [ ] ; Dysphagia [ ] ; Melena [ ] ; Hematochezia [ ] ; Heartburn [ ] ; Abdominal pain [ ] ; Constipation [ ] ; Diarrhea [ ] ; BRBPR [ ]     GU: Hematuria [ ] ; Dysuria [ ] ;  Nocturia [ ]   Vascular: Pain in legs with walking [ ] ; Pain in feet with lying flat [ ] ; Non-healing sores [ ] ; Stroke [ ] ; TIA [ ] ; Slurred speech [ ] ;    Neuro: Headaches [ ] ; Vertigo [ ] ; Seizures [ ] ; Paresthesias [ ] ;Blurred vision [ ] ; Diplopia [ ] ; Vision changes [ ]     Ortho/Skin: Arthritis [ ] ; Joint pain [ ] ; Muscle  pain [ ] ; Joint swelling [ ] ; Back Pain [ ] ; Rash [ ]     Psych: Depression [ ] ; Anxiety [ ]     Heme: Bleeding problems [ ] ; Clotting disorders [ ] ; Anemia [ ]     Endocrine: Diabetes [ ] ; Thyroid dysfunction [ ]    Physical Exam/Data:   Vitals:   06/21/20 0018 06/21/20 0117 06/21/20 0230 06/21/20 0300  BP: (!) 150/79  126/66 129/66  Pulse: 78   60  Resp: 16  18 18   Temp: 97.6 F (36.4 C)     TempSrc: Oral     SpO2: 97%  100% 96%  Weight:  77.1 kg    Height:  5\' 7"  (1.702 m)     No intake or output data in the 24 hours ending 06/21/20 0335 Last 3 Weights 06/21/2020 02/26/2020 02/01/2020  Weight (lbs) 170 lb 170 lb 171 lb  Weight (kg) 77.111 kg 77.111 kg 77.565 kg     Body mass index is 26.63 kg/m.  General:  Well nourished, well developed, in no acute distress HEENT: normal Lymph: no adenopathy Neck: no JVD Endocrine:  No thryomegaly Vascular: No carotid bruits; FA pulses 2+ bilaterally without bruits  Cardiac:  normal S1, S2; RRR; no murmur  Lungs:  clear to auscultation bilaterally, no wheezing, rhonchi or rales  Abd: soft, nontender, no hepatomegaly  Ext: no LE edema Musculoskeletal:  No deformities, BUE and BLE strength normal and equal Skin: warm and dry  Neuro:  CNs 2-12 intact, no focal abnormalities noted Psych:  Normal affect   EKG:  The ECG that was done with A paced rhythm (HR 78) and no ischemic changes although paced  Relevant CV Studies:  Coronary angiography Result date: 11/12/18 1.  Severe underlying three-vessel coronary artery disease with patent LIMA to LAD, patent SVG to diagonal with severe proximal in-stent restenosis, patent SVG to RCA with moderate in-stent restenosis proximally and severe new stenosis in the mid to distal segment, and occluded SVG to ramus.  Significant in-stent restenosis in the distal LAD stent which was placed last year. 2.  Normal LV systolic function and left ventricular end-diastolic pressure. 3.  Successful PCI and  drug-eluting stent placement to SVG to diagonal and SVG to RCA.  TTE Result date: 02/07/18 - Left ventricle: Abnormal septal motion and posterior basal  hypokinesis. The cavity size was mildly dilated. Wall thickness  was normal. The estimated ejection fraction was 55%. Left  ventricular diastolic function parameters were normal.  - Left atrium: The atrium was moderately dilated.  - Right ventricle: The cavity size was mildly dilated.  - Right atrium: The atrium was mildly dilated.   Laboratory Data:  High Sensitivity Troponin:   Recent Labs  Lab 06/21/20 0102  TROPONINIHS 7      Chemistry Recent Labs  Lab 06/21/20 0102  NA 139  K 4.0  CL 107  CO2 22  GLUCOSE 112*  BUN 25*  CREATININE 0.96  CALCIUM 9.9  GFRNONAA >60  ANIONGAP 10    No results for input(s): PROT, ALBUMIN, AST, ALT, ALKPHOS, BILITOT  in the last 168 hours. Hematology Recent Labs  Lab 06/21/20 0102  WBC 18.0*  RBC 4.77  HGB 13.3  HCT 41.1  MCV 86.2  MCH 27.9  MCHC 32.4  RDW 14.6  PLT 167   BNPNo results for input(s): BNP, PROBNP in the last 168 hours.  DDimer No results for input(s): DDIMER in the last 168 hours.  Radiology/Studies:  DG Chest 2 View  Result Date: 06/21/2020 CLINICAL DATA:  Chest pain EXAM: CHEST - 2 VIEW COMPARISON:  11/11/2018 FINDINGS: Post sternotomy changes. Right-sided pacing device similar in position. No focal airspace disease or effusion. Stable cardiomediastinal silhouette. No pneumothorax. IMPRESSION: No active cardiopulmonary disease. Electronically Signed   By: Donavan Foil M.D.   On: 06/21/2020 01:43   TIMI Risk Score for Unstable Angina or Non-ST Elevation MI:   The patient's TIMI risk score is 4, which indicates a 20% risk of all cause mortality, new or recurrent myocardial infarction or need for urgent revascularization in the next 14 days.{  Assessment and Plan:   38M with CAD s/p CABG (2004), multiple PCI (SVG-PDA/SVG-diag 2016, x2 to LAD, ISR of  SVG-diag/SVG-OM, 2019, SVG-PDA/SVG-diag, 2020), PAD, prior HFrEF with now improved EF, HTN, HLD, OSA, and DM2 who presents with recurrent chest pressure.  This is similar to his prior episodes with each coronary evaluation resulting in recurrent PCI.  ECG is similar to prior but is paced.  Troponins ruled out.  The chronicity of his symptoms are hard for him to delineate although he clearly has had increased frequency and severity of symptoms over the past few weeks per his report.  Last dose of Xarelto was oh 3/13 PM at 2100 and he is currently chest pressure 0-1/10 after 1 inch Nitropaste was applied.  He was transitioned to heparin in the ED as a bedside continue until we reassess his coronaries.  Given his extensive prior revascularization and repeat PCI I do not think other testing outside coronary angiography will be very helpful.  Tentatively plan for coronary angiogram on 03/15 given that he is currently symptom controlled to allow for Xarelto washout.  We will continue all his other home medications.  He was asked to continue daily aspirin tomorrow.  Severity of Illness: The appropriate patient status for this patient is INPATIENT. Inpatient status is judged to be reasonable and necessary in order to provide the required intensity of service to ensure the patient's safety. The patient's presenting symptoms, physical exam findings, and initial radiographic and laboratory data in the context of their chronic comorbidities is felt to place them at high risk for further clinical deterioration. Furthermore, it is not anticipated that the patient will be medically stable for discharge from the hospital within 2 midnights of admission. The following factors support the patient status of inpatient.   " The patient's presenting symptoms include chest pressure. " The worrisome physical exam findings include chest pressure. " The initial radiographic and laboratory data are worrisome because of none. " The  chronic co-morbidities include HTN, HLD, CAD, DM2.  * I certify that at the point of admission it is my clinical judgment that the patient will require inpatient hospital care spanning beyond 2 midnights from the point of admission due to high intensity of service, high risk for further deterioration and high frequency of surveillance required.*   For questions or updates, please contact [place] Please consult [email or web address] for contact info under     Signed, Dion Body, MD  06/21/2020 3:35 AM  Personally seen and examined. Agree with fellow above with the following comments: Briefly 80 yo M with a CAD s/p distant CABG with multiple ISR lesions; PAD with prior left radial hematoma after LHC and R SFA stents in the past, SSS s/p Medtronic Adapta in 2017, Afib on Xarelto last dose 06/20/20 PM who presents for unstable angina Patient notes he can do very little without having a return of his chest pain that radiates to his neck Exam notable for bilateral femoral pulses Labs notable for normal novel troponin testing, normal creatinine, and no anemia Would recommend  - ASA 81 mg load done - holding Xarelto; heparin consult placed - Lipitor 80 mg PO daily - nitro patch and IMDUR 60 mg - continue BB - SSI and holding glipizide - holding thiazide prior to [facility] tomorrow - adding Ranexa - continue hydralazine - cardiac carb consistent diet - labs placed - full code - potential plan for LCG tomorrow 06/22/20 after DOAC wash out; potential to delay further if access issues  - Risks and benefits of cardiac catheterization have been discussed with the patient.  These include bleeding, infection, kidney damage, stroke, heart attack, death.  The patient understands these risks and is willing to proceed.

## 2020-06-21 NOTE — ED Notes (Signed)
Atrial paced

## 2020-06-21 NOTE — ED Triage Notes (Signed)
Pt reports that he is having chest pain that is described as a pressure, pain in neck and left shoulder. Started about an hour ago. Denies nausea or vomiting.

## 2020-06-21 NOTE — ED Provider Notes (Signed)
[facility] EMERGENCY DEPARTMENT Provider Note   CSN: [id number] Arrival date & time: 06/21/20  0009   History Chief Complaint  Patient presents with  . Chest Pain    Robert Burns is a 80 y.o. male.  The history is provided by the patient.  Chest Pain He has history of hypertension, diabetes, hyperlipidemia, sick sinus syndrome with permanent pacemaker present, paroxysmal atrial fibrillation anticoagulated with rivaroxaban, coronary artery disease, peripheral vascular disease and comes in because of a pressure feeling in his central chest.  This came on with minimal exertion while he was feeding his dogs.  He denies dyspnea, nausea, diaphoresis.  He has been having exertional dyspnea for the last several months, but it is requiring progressively less and less exertion to bring the chest discomfort on.  He has also noted extreme fatigue and lack of energy.  He has had coronary artery bypass and multiple stents placed and he states that he has actually gone longer than he usually goes between stent interventions.  Past Medical History:  Diagnosis Date  . Arthritis    "all over" (11/25/2015)  . CAD (coronary artery disease)    a. s/p CABG  06/2002; b. 02/01/15 PCI: DES to prox SVG to PDA, staged PCI of SVG to Diag in 02/2015; c. 04/2017 Cath/PCI: LM nl, LAD 100ost, 40m, 75d, LCX 60ost, OM2 80, RCA 100ost, RPDA 80, LIMA->LAD ok, VG->D1 patent stent, VG->RPDA patent stent, 50p, VG->OM1->OM2 90p (3.0x24 Synergy DES), 100 between OM1->OM2 (med rx).  . Cancer ([place])    Right Shoulder, Left Leg- BCC, SCC, AND MELANOMA  . Epithelioid hemangioendothelioma    s/p resection of left SFA/mass with interposition 6 mm GoreTex graft 06/02/10, s/p repeat resection for positive margins 09/22/10  . Hyperlipidemia   . Hypertension   . Leg pain   . OSA on CPAP   . PAD (peripheral artery disease) ([place])    a. 10/2002 L SFA PTA/BMS; b. 8/17 LE Angio: LEIA 90 (9x40 self exp stent), LSFA short  segment prox occlusion (staged PTA/stenting 01/03/2016), patent mid stent, RSFA 38m (staged PTA/DEB 02/14/2016).  . Presence of permanent cardiac pacemaker    Medtronic  . SSS (sick sinus syndrome) ([place])    a. s/p PPM in 2007 with gen change 04/2015 - Medtronic Adapta ADDRL1, ser # [id number].  . Type II diabetes mellitus ([place])    Type II  . Urgency of urination     Patient Active Problem List   Diagnosis Date Noted  . Chronic anticoagulation 08/12/2019  . Dyslipidemia (high LDL; low HDL) 03/05/2019  . Unstable angina ([place]) 11/11/2018  . Hypercholesterolemia 02/20/2018  . Paroxysmal atrial fibrillation ([place]) 02/20/2018  . CAD (coronary artery disease) of bypass graft 02/06/2018  . CAD, multiple vessel 02/06/2018  . Leukocytosis 02/16/2016  . Peripheral arterial disease ([place])   . Pacemaker 04/23/2015  . Positive cardiac stress test   . OSA on CPAP 11/04/2012  . SSS (sick sinus syndrome), medtronic adapta   . Hyperlipidemia due to type 2 diabetes mellitus ([place])   . Superficial femoral artery injury 05/09/2011  . SPRAIN&STRAIN OTHER SPECIFIED SITES KNEE&LEG 01/31/2010  . Type 2 diabetes mellitus with complication, without long-term current use of insulin ([place]) 11/26/2006  . Essential hypertension 11/26/2006    Past Surgical History:  Procedure Laterality Date  . CARDIAC CATHETERIZATION N/A 02/01/2015   Procedure: Left Heart Cath and Coronary Angiography;  Surgeon: Lorretta Harp, MD;  Location: [facility];  Service: Cardiovascular;  Laterality:  N/A;  . CARDIAC CATHETERIZATION N/A 02/01/2015   Procedure: Coronary Stent Intervention;  Surgeon: Lorretta Harp, MD;  Location: [facility];  Service: Cardiovascular;  Laterality: N/A;  . CARDIAC CATHETERIZATION  06/2002   "just before bypass OR"  . CARDIAC CATHETERIZATION N/A 03/01/2015   Procedure: Coronary Stent Intervention;  Surgeon: Lorretta Harp, MD;  Location: [facility];  Service: Cardiovascular;   Laterality: N/A;  . CARDIAC CATHETERIZATION  02/06/2018  . COLONOSCOPY    . CORONARY ANGIOPLASTY    . CORONARY ARTERY BYPASS GRAFT  06/2002   x5, LIMA-LAD;VG- Diag; seq VG- ramus & OM branch; VG-PDA  . CORONARY STENT INTERVENTION N/A 04/12/2017   Procedure: CORONARY STENT INTERVENTION;  Surgeon: Lorretta Harp, MD;  Location: [facility];  Service: Cardiovascular;  Laterality: N/A;  . CORONARY STENT INTERVENTION N/A 02/08/2018   Procedure: CORONARY STENT INTERVENTION;  Surgeon: Jettie Booze, MD;  Location: [facility];  Service: Cardiovascular;  Laterality: N/A;  . CORONARY STENT INTERVENTION N/A 11/12/2018   Procedure: CORONARY STENT INTERVENTION;  Surgeon: Wellington Hampshire, MD;  Location: [facility];  Service: Cardiovascular;  Laterality: N/A;  . EP IMPLANTABLE DEVICE N/A 04/23/2015   Procedure: PPM Generator Changeout;  Surgeon: Sanda Klein, MD;  Location: [facility];  Service: Cardiovascular;  Laterality: N/A;  . FALSE ANEURYSM REPAIR Left 11/29/2018   Procedure: REPAIR FALSE ANEURYSM LEFT RADIAL ARTERY;  Surgeon: [name], MD;  Location: [facility];  Service: Vascular;  Laterality: Left;  . FEMORAL ARTERY STENT Left ~ 2014   "taken out of my leg; couldn' catorgorize what kind so the put it under all 3"; cataroziepitheloid hemanioendotheliomau  . FOOT FRACTURE SURGERY Left 1973  . FRACTURE SURGERY    . INSERT / REPLACE / REMOVE PACEMAKER  10/13/05   right side, medtronic Adapta  . KNEE HARDWARE REMOVAL Right 1950's   "3-4 months after the insertion"  . KNEE SURGERY Right 1950's   "broke my lower leg; had to put pin in my knee to keep lower leg in place til it healed"  . LEFT HEART CATH AND CORS/GRAFTS ANGIOGRAPHY N/A 04/12/2017   Procedure: LEFT HEART CATH AND CORS/GRAFTS ANGIOGRAPHY;  Surgeon: Lorretta Harp, MD;  Location: [facility];  Service: Cardiovascular;  Laterality: N/A;  . LEFT HEART CATH AND CORS/GRAFTS ANGIOGRAPHY N/A 02/06/2018    Procedure: LEFT HEART CATH AND CORS/GRAFTS ANGIOGRAPHY;  Surgeon: Troy Sine, MD;  Location: [facility];  Service: Cardiovascular;  Laterality: N/A;  . LEFT HEART CATH AND CORS/GRAFTS ANGIOGRAPHY N/A 11/12/2018   Procedure: LEFT HEART CATH AND CORS/GRAFTS ANGIOGRAPHY;  Surgeon: Wellington Hampshire, MD;  Location: [facility];  Service: Cardiovascular;  Laterality: N/A;  . PERIPHERAL VASCULAR CATHETERIZATION N/A 11/25/2015   Procedure: Lower Extremity Angiography;  Surgeon: Lorretta Harp, MD;  Location: [facility];  Service: Cardiovascular;  Laterality: N/A;  . PERIPHERAL VASCULAR CATHETERIZATION Left 11/25/2015   Procedure: Peripheral Vascular Intervention;  Surgeon: Lorretta Harp, MD;  Location: [facility];  Service: Cardiovascular;  Laterality: Left;  external iliac  . PERIPHERAL VASCULAR CATHETERIZATION N/A 01/03/2016   Procedure: Lower Extremity Angiography;  Surgeon: Lorretta Harp, MD;  Location: [facility];  Service: Cardiovascular;  Laterality: N/A;  . PERIPHERAL VASCULAR CATHETERIZATION Left 01/03/2016   Procedure: Peripheral Vascular Intervention;  Surgeon: Lorretta Harp, MD;  Location: [facility];  Service:  Cardiovascular;  Laterality: Left;  SFA  . PERIPHERAL VASCULAR CATHETERIZATION Right 02/14/2016   Procedure: Peripheral Vascular Atherectomy;  Surgeon: Lorretta Harp, MD;  Location: [facility];  Service: Cardiovascular;  Laterality: Right;  SFA  . POPLITEAL ARTERY STENT  01/03/2016   Contralateral access with a 7 French crossover sheath (second order catheter placement)  . TONSILLECTOMY AND ADENOIDECTOMY    . TUMOR EXCISION Right ~ 2005   cancerous tumor removed from shoulder  . TUMOR EXCISION Right ~ 2000   benign tumor removed from under shoulder  . TUMOR EXCISION Left 06/02/2010   resection of Lt SFA wth interposition of Gore-Tex graft       Family History  Problem Relation Age of Onset  . Coronary artery  disease Mother   . Heart attack Mother   . Hypertension Mother   . Heart disease Mother        Open  Heart surgery  . Diabetes Father   . Heart disease Father   . Hyperlipidemia Father   . Heart attack Father   . Hypertension Sister   . Diabetes Sister   . Heart disease Sister     Social History   Tobacco Use  . Smoking status: Former Smoker    Years: 10.00    Types: Pipe    Quit date: 1973    Years since quitting: 49.2  . Smokeless tobacco: Never Used  . Tobacco comment: "quit smoking in 1973"  Vaping Use  . Vaping Use: Never used  Substance Use Topics  . Alcohol use: No  . Drug use: No    Home Medications Prior to Admission medications   Medication Sig Start Date End Date Taking? Authorizing Provider  amLODipine (NORVASC) 10 MG tablet Take 10 mg by mouth daily.     [provider]  amoxicillin (AMOXIL) 875 MG tablet Take one tab PO Q12hr 02/01/20   Kandra Nicolas, MD  atorvastatin (LIPITOR) 80 MG tablet Take 1 tablet (80 mg total) by mouth daily at 6 PM. 02/09/18   Eileen Stanford, PA-C  Cholecalciferol (VITAMIN D3) 2000 units capsule Take 2,000 Units by mouth 2 (two) times daily.    [provider]  glipiZIDE (GLUCOTROL) 5 MG tablet Take 5 mg by mouth daily with breakfast.  01/10/18   [provider]  hydrALAZINE (APRESOLINE) 25 MG tablet Take 25 mg by mouth 2 (two) times daily.     [provider]  hydrochlorothiazide (HYDRODIURIL) 25 MG tablet Take 25 mg by mouth every morning.    [provider]  isosorbide mononitrate (IMDUR) 60 MG 24 hr tablet TAKE 1 TABLET EVERY DAY 12/01/19   Lorretta Harp, MD  melatonin 5 MG TABS 1 tablet in the evening    [provider]  metFORMIN (GLUCOPHAGE) 1000 MG tablet Take 1 tablet (1,000 mg total) by mouth 2 (two) times daily with a meal. 02/10/18   Eileen Stanford, PA-C  metoprolol tartrate (LOPRESSOR) 100 MG tablet Take 1 tablet (100 mg total) by mouth 2 (two) times  daily. 08/07/17   Lendon Colonel, NP  MULTIPLE VITAMIN-FOLIC ACID PO 1 tablet    [provider]  neomycin-polymyxin-hydrocortisone (CORTISPORIN) 3.5-10000-1 OTIC suspension Place 3 drops into both ears 3 (three) times daily. 02/01/20   Kandra Nicolas, MD  nitroGLYCERIN (NITROSTAT) 0.4 MG SL tablet Place 1 tablet (0.4 mg total) under the tongue every 5 (five) minutes as needed for chest pain. 04/15/17   Theora Gianotti,  NP  potassium chloride SA (K-DUR,KLOR-CON) 20 MEQ tablet Take 1 tablet (20 mEq total) by mouth daily. 02/18/16   Regalado, Belkys A, MD  rOPINIRole (REQUIP) 1 MG tablet Take 1 mg by mouth daily.    [provider]  tamsulosin (FLOMAX) 0.4 MG CAPS capsule Take 0.4 mg by mouth 2 (two) times daily.  03/09/15   [provider]  XARELTO 20 MG TABS tablet TAKE 1 TABLET (20 MG TOTAL) BY MOUTH DAILY WITH SUPPER. 06/08/20   Croitoru, Mihai, MD    Allergies    Peanut-containing drug products, Ace inhibitors, Diltiazem hcl, Meloxicam, Eliquis [apixaban], Carvedilol, Clonidine hcl, and Duloxetine hcl  Review of Systems   Review of Systems  Cardiovascular: Positive for chest pain.  All other systems reviewed and are negative.   Physical Exam Updated Vital Signs BP (!) 150/79 (BP Location: Right Arm)   Pulse 78   Temp 97.6 F (36.4 C) (Oral)   Resp 16   Ht 5\' 7"  (1.702 m)   Wt 77.1 kg   SpO2 97%   BMI 26.63 kg/m   Physical Exam Vitals and nursing note reviewed.   80 year old male, resting comfortably and in no acute distress. Vital signs are significant for elevated blood pressure. Oxygen saturation is 97%, which is normal. Head is normocephalic and atraumatic. PERRLA, EOMI. Oropharynx is clear. Neck is nontender and supple without adenopathy or JVD. Back is nontender and there is no CVA tenderness. Lungs are clear without rales, wheezes, or rhonchi. Chest is nontender. Heart has regular rate and rhythm without murmur. Abdomen is  soft, flat, nontender without masses or hepatosplenomegaly and peristalsis is normoactive. Extremities have no cyanosis or edema, full range of motion is present. Skin is warm and dry without rash. Neurologic: Mental status is normal, cranial nerves are intact, there are no motor or sensory deficits.  ED Results / Procedures / Treatments   Labs (all labs ordered are listed, but only abnormal results are displayed) Labs Reviewed  BASIC METABOLIC PANEL - Abnormal; Notable for the following components:      Result Value   Glucose, Bld 112 (*)    BUN 25 (*)    All other components within normal limits  CBC - Abnormal; Notable for the following components:   WBC 18.0 (*)    All other components within normal limits  SARS CORONAVIRUS 2 (TAT 6-24 HRS)  APTT  PROTIME-INR  TROPONIN I (HIGH SENSITIVITY)  TROPONIN I (HIGH SENSITIVITY)    EKG EKG Interpretation  Date/Time:  Monday June 21 2020 01:06:24 EDT Ventricular Rate:  78 PR Interval:  248 QRS Duration: 116 QT Interval:  354 QTC Calculation: 403 R Axis:   105 Text Interpretation: Atrial-paced rhythm with prolonged AV conduction Rightward axis Anteroseptal infarct , age undetermined Abnormal ECG When compared with ECG of 11/13/2018, Right bundle branch block is no longer present Confirmed by Delora Fuel ([id number]) on 06/21/2020 1:32:53 AM   Radiology DG Chest 2 View  Result Date: 06/21/2020 CLINICAL DATA:  Chest pain EXAM: CHEST - 2 VIEW COMPARISON:  11/11/2018 FINDINGS: Post sternotomy changes. Right-sided pacing device similar in position. No focal airspace disease or effusion. Stable cardiomediastinal silhouette. No pneumothorax. IMPRESSION: No active cardiopulmonary disease. Electronically Signed   By: Donavan Foil M.D.   On: 06/21/2020 01:43    Procedures Procedures  CRITICAL CARE Performed by: Delora Fuel Total critical care time: 40 minutes Critical care time was exclusive of separately billable procedures and treating  other patients. Critical  care was necessary to treat or prevent imminent or life-threatening deterioration. Critical care was time spent personally by me on the following activities: development of treatment plan with patient and/or surrogate as well as nursing, discussions with consultants, evaluation of patient's response to treatment, examination of patient, obtaining history from patient or surrogate, ordering and performing treatments and interventions, ordering and review of laboratory studies, ordering and review of radiographic studies, pulse oximetry and re-evaluation of patient's condition.  Medications Ordered in ED Medications  nitroGLYCERIN (NITROSTAT) SL tablet 0.4 mg (has no administration in time range)  heparin ADULT infusion 100 units/mL (25000 units/227mL) (has no administration in time range)  aspirin chewable tablet 324 mg (324 mg Oral Given 06/21/20 0220)  nitroGLYCERIN (NITROGLYN) 2 % ointment 1 inch (1 inch Topical Given 06/21/20 0221)    ED Course  I have reviewed the triage vital signs and the nursing notes.  Pertinent labs & imaging results that were available during my care of the patient were reviewed by me and considered in my medical decision making (see chart for details).  MDM Rules/Calculators/A&P Chest discomfort in patient with known history of coronary artery disease.  Pattern is consistent with unstable angina.  ECG does not show any acute changes.  Curiously, prior right bundle branch block is no longer present today.  Chest x-ray shows no active cardiopulmonary disease.  Old records were reviewed confirming multiple coronary interventions.  He will be given aspirin and will be anticoagulated.  Heparin will not be initiated immediately because he took his last dose of rivaroxaban at about 9 PM.  He will be given nitroglycerin sublingually and topically.  Troponin is normal and remainder of labs are unremarkable.  Case is discussed with Dr. Renella Cunas cardiology  service agrees to admit the patient.  Final Clinical Impression(s) / ED Diagnoses Final diagnoses:  Unstable angina [facility])    Rx / DC Orders ED Discharge Orders    None       Delora Fuel, MD 87/56/43 [phone]

## 2020-06-21 NOTE — Plan of Care (Signed)
Initiate Care plan Problem: Education: Goal: Knowledge of General Education information will improve Description: Including pain rating scale, medication(s)/side effects and non-pharmacologic comfort measures Outcome: Progressing   Problem: Health Behavior/Discharge Planning: Goal: Ability to manage health-related needs will improve Outcome: Progressing   Problem: Clinical Measurements: Goal: Ability to maintain clinical measurements within normal limits will improve Outcome: Progressing Goal: Will remain free from infection Outcome: Progressing Goal: Diagnostic test results will improve Outcome: Progressing Goal: Respiratory complications will improve Outcome: Progressing Goal: Cardiovascular complication will be avoided Outcome: Progressing   Problem: Activity: Goal: Risk for activity intolerance will decrease Outcome: Progressing   Problem: Nutrition: Goal: Adequate nutrition will be maintained Outcome: Progressing   Problem: Coping: Goal: Level of anxiety will decrease Outcome: Progressing   Problem: Elimination: Goal: Will not experience complications related to bowel motility Outcome: Progressing Goal: Will not experience complications related to urinary retention Outcome: Progressing   Problem: Pain Managment: Goal: General experience of comfort will improve Outcome: Progressing   Problem: Safety: Goal: Ability to remain free from injury will improve Outcome: Progressing   Problem: Skin Integrity: Goal: Risk for impaired skin integrity will decrease Outcome: Progressing   Problem: Education: Goal: Understanding of cardiac disease, CV risk reduction, and recovery process will improve Outcome: Progressing Goal: Individualized Educational Video(s) Outcome: Progressing   Problem: Activity: Goal: Ability to tolerate increased activity will improve Outcome: Progressing   Problem: Cardiac: Goal: Ability to achieve and maintain adequate cardiovascular  perfusion will improve Outcome: Progressing   Problem: Health Behavior/Discharge Planning: Goal: Ability to safely manage health-related needs after discharge will improve Outcome: Progressing   Problem: Education: Goal: Understanding of CV disease, CV risk reduction, and recovery process will improve Outcome: Progressing Goal: Individualized Educational Video(s) Outcome: Progressing   Problem: Activity: Goal: Ability to return to baseline activity level will improve Outcome: Progressing   Problem: Cardiovascular: Goal: Ability to achieve and maintain adequate cardiovascular perfusion will improve Outcome: Progressing Goal: Vascular access site(s) Level 0-1 will be maintained Outcome: Progressing   Problem: Health Behavior/Discharge Planning: Goal: Ability to safely manage health-related needs after discharge will improve Outcome: Progressing

## 2020-06-22 ENCOUNTER — Ambulatory Visit: Payer: Medicare HMO | Admitting: Cardiovascular Disease

## 2020-06-22 ENCOUNTER — Inpatient Hospital Stay (HOSPITAL_COMMUNITY)
Admission: EM | Disposition: A | Discharge status: Home / Self Care | Payer: Self-pay | Source: Home / Self Care | Attending: Cardiovascular Disease

## 2020-06-22 ENCOUNTER — Inpatient Hospital Stay (HOSPITAL_COMMUNITY): Payer: Medicare HMO

## 2020-06-22 ENCOUNTER — Other Ambulatory Visit: Payer: Self-pay

## 2020-06-22 DIAGNOSIS — I739 Peripheral vascular disease, unspecified: Secondary | ICD-10-CM

## 2020-06-22 DIAGNOSIS — I25710 Atherosclerosis of autologous vein coronary artery bypass graft(s) with unstable angina pectoris: Secondary | ICD-10-CM

## 2020-06-22 DIAGNOSIS — I5023 Acute on chronic systolic (congestive) heart failure: Secondary | ICD-10-CM

## 2020-06-22 HISTORY — PX: LEFT HEART CATH AND CORS/GRAFTS ANGIOGRAPHY: CATH118250

## 2020-06-22 HISTORY — PX: CORONARY STENT INTERVENTION: CATH118234

## 2020-06-22 LAB — CBC
HCT: 36.4 % — ABNORMAL LOW (ref 39.0–52.0)
Hemoglobin: 11.7 g/dL — ABNORMAL LOW (ref 13.0–17.0)
MCH: 27.4 pg (ref 26.0–34.0)
MCHC: 32.1 g/dL (ref 30.0–36.0)
MCV: 85.2 fL (ref 80.0–100.0)
Platelets: 157 10*3/uL (ref 150–400)
RBC: 4.27 MIL/uL (ref 4.22–5.81)
RDW: 14.5 % (ref 11.5–15.5)
WBC: 16.5 10*3/uL — ABNORMAL HIGH (ref 4.0–10.5)
nRBC: 0 % (ref 0.0–0.2)

## 2020-06-22 LAB — GLUCOSE, CAPILLARY
Glucose-Capillary: 150 mg/dL — ABNORMAL HIGH (ref 70–99)
Glucose-Capillary: 140 mg/dL — ABNORMAL HIGH (ref 70–99)
Glucose-Capillary: 133 mg/dL — ABNORMAL HIGH (ref 70–99)
Glucose-Capillary: 182 mg/dL — ABNORMAL HIGH (ref 70–99)

## 2020-06-22 LAB — ECHOCARDIOGRAM COMPLETE
AR max vel: 2.59 cm2
AV Area VTI: 2.47 cm2
AV Area mean vel: 2.44 cm2
AV Mean grad: 2 mmHg
AV Peak grad: 4.1 mmHg
Ao pk vel: 1.01 m/s
Area-P 1/2: 3.12 cm2
Calc EF: 45 %
Height: 67 in
MV VTI: 1.59 cm2
S' Lateral: 3.4 cm
Single Plane A2C EF: 46.3 %
Single Plane A4C EF: 42 %
Weight: 2825.42 oz

## 2020-06-22 LAB — BASIC METABOLIC PANEL
Anion gap: 7 (ref 5–15)
BUN: 25 mg/dL — ABNORMAL HIGH (ref 8–23)
CO2: 25 mmol/L (ref 22–32)
Calcium: 9.2 mg/dL (ref 8.9–10.3)
Chloride: 105 mmol/L (ref 98–111)
Creatinine, Ser: 1.16 mg/dL (ref 0.61–1.24)
GFR, Estimated: >60 mL/min (ref >60)
Glucose, Bld: 131 mg/dL — ABNORMAL HIGH (ref 70–99)
Potassium: 3.5 mmol/L (ref 3.5–5.1)
Sodium: 137 mmol/L (ref 135–145)

## 2020-06-22 LAB — HEPARIN LEVEL (UNFRACTIONATED): Heparin Unfractionated: 2 IU/mL — ABNORMAL HIGH (ref 0.30–0.70)

## 2020-06-22 LAB — PROTIME-INR
INR: 1.5 — ABNORMAL HIGH (ref 0.8–1.2)
Prothrombin Time: 17.7 seconds — ABNORMAL HIGH (ref 11.4–15.2)

## 2020-06-22 LAB — POCT ACTIVATED CLOTTING TIME
Activated Clotting Time: 267 seconds
Activated Clotting Time: 368 seconds
Activated Clotting Time: 380 seconds
Activated Clotting Time: 225 seconds

## 2020-06-22 LAB — APTT: aPTT: 59 seconds — ABNORMAL HIGH (ref 24–36)

## 2020-06-22 SURGERY — LEFT HEART CATH AND CORS/GRAFTS ANGIOGRAPHY
Anesthesia: LOCAL

## 2020-06-22 MED ORDER — KETOROLAC TROMETHAMINE 0.5 % OP SOLN
1.0000 [drp] | Freq: Four times a day (QID) | OPHTHALMIC | Status: DC
  Administered 2020-06-22 – 2020-06-24 (×6): 1 [drp] via OPHTHALMIC
  Filled 2020-06-22: qty 5

## 2020-06-22 MED ORDER — SODIUM CHLORIDE 0.9 % IV SOLN: INTRAVENOUS | Status: AC

## 2020-06-22 MED ORDER — MIDAZOLAM HCL 2 MG/2ML IJ SOLN
INTRAMUSCULAR | Status: AC
  Filled 2020-06-22: qty 2

## 2020-06-22 MED ORDER — FAMOTIDINE IN NACL 20-0.9 MG/50ML-% IV SOLN
INTRAVENOUS | Status: AC
  Filled 2020-06-22: qty 50

## 2020-06-22 MED ORDER — FENTANYL CITRATE (PF) 100 MCG/2ML IJ SOLN
INTRAMUSCULAR | Status: DC | PRN
  Administered 2020-06-22 (×2): 25 ug via INTRAVENOUS

## 2020-06-22 MED ORDER — CLOPIDOGREL BISULFATE 300 MG PO TABS
ORAL_TABLET | ORAL | Status: DC | PRN
  Administered 2020-06-22: 600 mg via ORAL

## 2020-06-22 MED ORDER — HYDRALAZINE HCL 20 MG/ML IJ SOLN: 10.0000 mg | INTRAMUSCULAR | Status: AC | PRN

## 2020-06-22 MED ORDER — HEPARIN (PORCINE) IN NACL 1000-0.9 UT/500ML-% IV SOLN
INTRAVENOUS | Status: AC
  Filled 2020-06-22: qty 1000

## 2020-06-22 MED ORDER — LIDOCAINE HCL (PF) 1 % IJ SOLN
INTRAMUSCULAR | Status: DC | PRN
  Administered 2020-06-22: 20 mL via SUBCUTANEOUS

## 2020-06-22 MED ORDER — IOHEXOL 350 MG/ML SOLN
INTRAVENOUS | Status: DC | PRN
  Administered 2020-06-22: 130 mL via INTRA_ARTERIAL

## 2020-06-22 MED ORDER — CLOPIDOGREL BISULFATE 75 MG PO TABS
75.0000 mg | ORAL_TABLET | Freq: Every day | ORAL | Status: DC
  Administered 2020-06-23 – 2020-06-24 (×2): 75 mg via ORAL
  Filled 2020-06-22 (×2): qty 1

## 2020-06-22 MED ORDER — SODIUM CHLORIDE 0.9% FLUSH: 3.0000 mL | INTRAVENOUS | Status: DC | PRN

## 2020-06-22 MED ORDER — LABETALOL HCL 5 MG/ML IV SOLN: 10.0000 mg | INTRAVENOUS | Status: AC | PRN

## 2020-06-22 MED ORDER — SODIUM CHLORIDE 0.9% FLUSH
3.0000 mL | Freq: Two times a day (BID) | INTRAVENOUS | Status: DC
  Administered 2020-06-23: 3 mL via INTRAVENOUS

## 2020-06-22 MED ORDER — FENTANYL CITRATE (PF) 100 MCG/2ML IJ SOLN
INTRAMUSCULAR | Status: AC
  Filled 2020-06-22: qty 2

## 2020-06-22 MED ORDER — LIDOCAINE HCL (PF) 1 % IJ SOLN
INTRAMUSCULAR | Status: AC
  Filled 2020-06-22: qty 30

## 2020-06-22 MED ORDER — FAMOTIDINE IN NACL 20-0.9 MG/50ML-% IV SOLN
INTRAVENOUS | Status: AC | PRN
  Administered 2020-06-22: 20 mg via INTRAVENOUS

## 2020-06-22 MED ORDER — ONDANSETRON HCL 4 MG/2ML IJ SOLN: 4.0000 mg | Freq: Four times a day (QID) | INTRAMUSCULAR | Status: DC | PRN

## 2020-06-22 MED ORDER — HEPARIN SODIUM (PORCINE) 1000 UNIT/ML IJ SOLN
INTRAMUSCULAR | Status: AC
  Filled 2020-06-22: qty 1

## 2020-06-22 MED ORDER — DORZOLAMIDE HCL-TIMOLOL MAL 2-0.5 % OP SOLN
1.0000 [drp] | Freq: Two times a day (BID) | OPHTHALMIC | Status: DC
  Administered 2020-06-22 – 2020-06-24 (×4): 1 [drp] via OPHTHALMIC
  Filled 2020-06-22: qty 10

## 2020-06-22 MED ORDER — MIDAZOLAM HCL 2 MG/2ML IJ SOLN
INTRAMUSCULAR | Status: DC | PRN
  Administered 2020-06-22: 1 mg via INTRAVENOUS
  Administered 2020-06-22: 2 mg via INTRAVENOUS

## 2020-06-22 MED ORDER — ACETAMINOPHEN 325 MG PO TABS: 650.0000 mg | ORAL_TABLET | ORAL | Status: DC | PRN

## 2020-06-22 MED ORDER — HEPARIN SODIUM (PORCINE) 1000 UNIT/ML IJ SOLN
INTRAMUSCULAR | Status: DC | PRN
  Administered 2020-06-22: 3000 [IU] via INTRAVENOUS
  Administered 2020-06-22: 8000 [IU] via INTRAVENOUS

## 2020-06-22 MED ORDER — NITROGLYCERIN 1 MG/10 ML FOR IR/CATH LAB
INTRA_ARTERIAL | Status: AC
  Filled 2020-06-22: qty 10

## 2020-06-22 MED ORDER — VERAPAMIL HCL 2.5 MG/ML IV SOLN
INTRAVENOUS | Status: DC | PRN
  Administered 2020-06-22 (×2): 200 ug via INTRACORONARY

## 2020-06-22 MED ORDER — DIFLUPREDNATE 0.05 % OP EMUL
1.0000 [drp] | Freq: Four times a day (QID) | OPHTHALMIC | Status: DC
  Administered 2020-06-22 – 2020-06-24 (×6): 1 [drp] via OPHTHALMIC
  Filled 2020-06-22: qty 5

## 2020-06-22 MED ORDER — CLOPIDOGREL BISULFATE 300 MG PO TABS
ORAL_TABLET | ORAL | Status: AC
  Filled 2020-06-22: qty 2

## 2020-06-22 MED ORDER — HEPARIN (PORCINE) IN NACL 1000-0.9 UT/500ML-% IV SOLN
INTRAVENOUS | Status: DC | PRN
  Administered 2020-06-22 (×3): 500 mL

## 2020-06-22 MED ORDER — SODIUM CHLORIDE 0.9 % IV SOLN: 250.0000 mL | INTRAVENOUS | Status: DC | PRN

## 2020-06-22 SURGICAL SUPPLY — 26 items
BALLN SAPPHIRE ~~LOC~~ 3.5X12 (BALLOONS) ×2 IMPLANT
BALLN WOLVERINE 3.00X10 (BALLOONS) ×2
BALLN WOLVERINE 3.25X10 (BALLOONS) ×2
BALLOON WOLVERINE 3.00X10 (BALLOONS) ×1 IMPLANT
BALLOON WOLVERINE 3.25X10 (BALLOONS) ×1 IMPLANT
CATH EXPO 5F MPA-1 (CATHETERS) ×2 IMPLANT
CATH INFINITI 5 FR LCB (CATHETERS) ×2 IMPLANT
CATH INFINITI 5FR MULTPACK ANG (CATHETERS) ×2 IMPLANT
CATH LAUNCHER 6FR AL1 (CATHETERS) ×1 IMPLANT
CATHETER LAUNCHER 6FR AL1 (CATHETERS) ×2
CATHETER LAUNCHER 6FR MP1 (CATHETERS) ×2 IMPLANT
GLIDESHEATH SLEND SS 6F .021 (SHEATH) IMPLANT
GUIDEWIRE INQWIRE 1.5J.035X260 (WIRE) IMPLANT
INQWIRE 1.5J .035X260CM (WIRE)
KIT ENCORE 26 ADVANTAGE (KITS) ×2 IMPLANT
KIT HEART LEFT (KITS) ×2 IMPLANT
KIT HEMO VALVE WATCHDOG (MISCELLANEOUS) ×2 IMPLANT
PACK CARDIAC CATHETERIZATION (CUSTOM PROCEDURE TRAY) ×2 IMPLANT
SHEATH PINNACLE 5F 10CM (SHEATH) ×2 IMPLANT
SHEATH PINNACLE 6F 10CM (SHEATH) ×2 IMPLANT
STENT RESOLUTE ONYX 3.0X12 (Permanent Stent) ×2 IMPLANT
TRANSDUCER W/STOPCOCK (MISCELLANEOUS) ×2 IMPLANT
TUBING CIL FLEX 10 FLL-RA (TUBING) ×2 IMPLANT
WIRE ASAHI PROWATER 180CM (WIRE) ×2 IMPLANT
WIRE EMERALD 3MM-J .035X150CM (WIRE) ×2 IMPLANT
WIRE HI TORQ BMW 190CM (WIRE) ×2 IMPLANT

## 2020-06-22 NOTE — Plan of Care (Signed)

## 2020-06-22 NOTE — Progress Notes (Signed)
Progress Note  Patient Name: Robert Burns Date of Encounter: 06/22/2020  Primary Cardiologist: Quay Burow, MD   Subjective   No CP overnight.  Knows what to expect for heart catheterization and hopes they will be able to fix something.  Inpatient Medications    Scheduled Meds:  [MAR Hold] amLODipine  10 mg Oral Daily   [MAR Hold] aspirin  81 mg Oral Daily   [MAR Hold] atorvastatin  80 mg Oral q1800   [MAR Hold] Chlorhexidine Gluconate Cloth  6 each Topical Q0600   [MAR Hold] cholecalciferol  2,000 Units Oral BID   [MAR Hold] hydrALAZINE  25 mg Oral BID   [MAR Hold] insulin aspart  0-15 Units Subcutaneous TID WC   [MAR Hold] insulin aspart  0-5 Units Subcutaneous QHS   [MAR Hold] insulin aspart  4 Units Subcutaneous TID WC   [MAR Hold] isosorbide mononitrate  60 mg Oral Daily   [MAR Hold] metoprolol tartrate  100 mg Oral BID   [MAR Hold] mupirocin ointment  1 application Nasal BID   [MAR Hold] potassium chloride SA  20 mEq Oral Daily   [MAR Hold] ranolazine  500 mg Oral BID   [MAR Hold] rOPINIRole  1 mg Oral Daily   sodium chloride flush  3 mL Intravenous Q12H   [MAR Hold] tamsulosin  0.4 mg Oral BID   Continuous Infusions:  sodium chloride     sodium chloride 1 mL/kg/hr (06/22/20 0500)   heparin 1,200 Units/hr (06/22/20 0540)   PRN Meds: sodium chloride, clopidogrel, fentaNYL, Heparin (Porcine) in NaCl, heparin sodium (porcine), lidocaine (PF), midazolam, [MAR Hold] nitroGLYCERIN, sodium chloride flush, verapamil   Vital Signs    Vitals:   06/22/20 0300 06/22/20 0400 06/22/20 0800 06/22/20 1015  BP: (!) 141/64  139/62   Pulse: 66  61   Resp: 20  16   Temp: 98.2 F (36.8 C)  97.7 F (36.5 C)   TempSrc: Oral  Oral   SpO2: 96%  94% 99%  Weight:  80.1 kg    Height:        Intake/Output Summary (Last 24 hours) at 06/22/2020 1136 Last data filed at 06/22/2020 0540 Gross per 24 hour  Intake 396.56 ml  Output 550 ml  Net -153.44 ml    Filed Weights   06/21/20 0117 06/21/20 1736 06/22/20 0400  Weight: 77.1 kg 80.3 kg 80.1 kg    Telemetry    AP and occasional VP - Personally Reviewed  ECG    No new - Personally Reviewed  Physical Exam   GEN: No acute distress.   Neck: No JVD Cardiac: RRR, no murmurs, rubs, or gallops. Femoral +2 Respiratory: Clear to auscultation bilaterally. GI: Soft, nontender, non-distended  MS: No edema; No deformity. Neuro:  Nonfocal  Psych: Normal affect   Labs    Chemistry Recent Labs  Lab 06/21/20 0102 06/22/20 0051  NA 139 137  K 4.0 3.5  CL 107 105  CO2 22 25  GLUCOSE 112* 131*  BUN 25* 25*  CREATININE 0.96 1.16  CALCIUM 9.9 9.2  GFRNONAA >60 >60  ANIONGAP 10 7     Hematology Recent Labs  Lab 06/21/20 0102 06/22/20 0051  WBC 18.0* 16.5*  RBC 4.77 4.27  HGB 13.3 11.7*  HCT 41.1 36.4*  MCV 86.2 85.2  MCH 27.9 27.4  MCHC 32.4 32.1  RDW 14.6 14.5  PLT 167 157    Cardiac EnzymesNo results for input(s): TROPONINI in the last 168 hours. No results for  input(s): TROPIPOC in the last 168 hours.   BNPNo results for input(s): BNP, PROBNP in the last 168 hours.   DDimer No results for input(s): DDIMER in the last 168 hours.   Radiology    DG Chest 2 View  Result Date: 06/21/2020 CLINICAL DATA:  Chest pain EXAM: CHEST - 2 VIEW COMPARISON:  11/11/2018 FINDINGS: Post sternotomy changes. Right-sided pacing device similar in position. No focal airspace disease or effusion. Stable cardiomediastinal silhouette. No pneumothorax. IMPRESSION: No active cardiopulmonary disease. Electronically Signed   By: Donavan Foil M.D.   On: 06/21/2020 01:43    Cardiac Studies   In Cath now; Echo pending  Patient Profile     80 y.o. male with history of CAD s/p CABG in 2004 and multiple PCI (SVG-PDA/SVG-diag 2016, x2 to LAD, ISR of SVG-diag/SVG-OM, 2019, SVG-PDA/SVG-diag, 2020), PAD s/p SFA Stenting, SSS and AF s/p Medtronic Adapta and Xarelto (last at  < 2% burden), HTN and  DM presenting with unstable angina.  Assessment & Plan    Coronary Artery Disease; Obstructive PAD s/p Stenting - symptomatic  - anatomy: Unclear- LHC today - continue ASA 81 mg; if plavix load will need one month of triple therapy - continue statin, goal LDL < 70 - continue BB - continue Imdur - continue ranexa - discussed cardiac rehab - echo pending  HTN with DM - continue home hydralazine - SSI and Cardiac Carb Consistent Diet - Hold glipizide - low threshold for diabetic coordinator assistance.  Paroxsymal Atrial Fibrillation,  - Risk factors include CAD, HTN, DM, Age - CHADSVASC=5. - planned to return to Xarelto; BB as above  BPH- on home tamsulosin Restless leg- on home requip Low Vitamin D- on home supplement  Full Code AC for DVT prophylaxis NPO during procedure Labs orders in place  For questions or updates, please contact [facility] Please consult [email or web address] for contact info under Cardiology/STEMI.      Signed, Werner Lean, MD  06/22/2020, 11:36 AM

## 2020-06-22 NOTE — Progress Notes (Signed)
Site area: right groin  Site Prior to Removal:  Level 0  Pressure Applied For 20 MINUTES    Minutes Beginning at 1613  Manual:  yes  Patient Status During Pull:  stable  Post Pull Groin Site:  Level 1 (slight bruising, no hematoma)  Post Pull Instructions Given: yes  Post Pull Pulses Present:  yes  Dressing Applied:  yes  Comments: bedrest begins at 1633 x 4 hours

## 2020-06-22 NOTE — Progress Notes (Signed)
[facility] for Heparin Indication: chest pain/ACS  Allergies  Allergen Reactions  . Peanut-Containing Drug Products Anaphylaxis and Other (See Comments)    Tongue swelling is severe   . Ace Inhibitors Hives, Rash and Cough       . Diltiazem Hcl Other (See Comments)    UNSPECIFIED REACTION    . Meloxicam Other (See Comments)    GI upset GI BLEED   . Eliquis [Apixaban] Other (See Comments)    dizzyness  . Sertraline Hcl Other (See Comments)  . Carvedilol Itching  . Clonidine Hcl Other (See Comments)    Patch only - skin irritation  . Duloxetine Hcl Other (See Comments)    Urinary frequency    Patient Measurements: Height: 5\' 7"  (170.2 cm) Weight: 80.3 kg (177 lb 0.5 oz) IBW/kg (Calculated) : 66.1  Vital Signs: Temp: 98.2 F (36.8 C) (03/15 0300) Temp Source: Oral (03/15 0300) BP: 141/64 (03/15 0300) Pulse Rate: 66 (03/15 0300)  Labs: Recent Labs    06/21/20 0102 06/21/20 0303 06/21/20 1742 06/22/20 0051  HGB 13.3  --   --  11.7*  HCT 41.1  --   --  36.4*  PLT 167  --   --  157  APTT  --   --   --  59*  LABPROT  --   --  22.9*  --   INR  --   --  2.1*  --   HEPARINUNFRC  --   --   --  2.00*  CREATININE 0.96  --   --  1.16  TROPONINIHS 7 6  --   --     Estimated Creatinine Clearance: 50.7 mL/min (by C-G formula based on SCr of 1.16 mg/dL).   Medical History: Past Medical History:  Diagnosis Date  . Arthritis    "all over" (11/25/2015)  . CAD (coronary artery disease)    a. s/p CABG  06/2002; b. 02/01/15 PCI: DES to prox SVG to PDA, staged PCI of SVG to Diag in 02/2015; c. 04/2017 Cath/PCI: LM nl, LAD 100ost, 71m, 75d, LCX 60ost, OM2 80, RCA 100ost, RPDA 80, LIMA->LAD ok, VG->D1 patent stent, VG->RPDA patent stent, 50p, VG->OM1->OM2 90p (3.0x24 Synergy DES), 100 between OM1->OM2 (med rx).  . Cancer ([place])    Right Shoulder, Left Leg- BCC, SCC, AND MELANOMA  . Epithelioid hemangioendothelioma    s/p resection of left  SFA/mass with interposition 6 mm GoreTex graft 06/02/10, s/p repeat resection for positive margins 09/22/10  . Hyperlipidemia   . Hypertension   . Leg pain   . OSA on CPAP   . PAD (peripheral artery disease) ([place])    a. 10/2002 L SFA PTA/BMS; b. 8/17 LE Angio: LEIA 90 (9x40 self exp stent), LSFA short segment prox occlusion (staged PTA/stenting 01/03/2016), patent mid stent, RSFA 55m (staged PTA/DEB 02/14/2016).  . Presence of permanent cardiac pacemaker    Medtronic  . SSS (sick sinus syndrome) ([place])    a. s/p PPM in 2007 with gen change 04/2015 - Medtronic Adapta ADDRL1, ser # [id number] H.  . Type II diabetes mellitus ([place])    Type II  . Urgency of urination     Medications:  No current facility-administered medications on file prior to encounter.   Current Outpatient Medications on File Prior to Encounter  Medication Sig Dispense Refill  . amLODipine (NORVASC) 10 MG tablet Take 10 mg by mouth daily.    [name] atorvastatin (LIPITOR) 80 MG tablet Take 1 tablet (80 mg total) by  mouth daily at 6 PM. 90 tablet 1  . Cholecalciferol (VITAMIN D3) 2000 units capsule Take 2,000 Units by mouth 2 (two) times daily.    . dorzolamide-timolol (COSOPT) 22.3-6.8 MG/ML ophthalmic solution Place 1 drop into both eyes 2 (two) times daily.    . DUREZOL 0.05 % EMUL Place 1 drop into both eyes 4 (four) times daily.    [name] glipiZIDE (GLUCOTROL) 5 MG tablet Take 5 mg by mouth daily with breakfast.     . hydrALAZINE (APRESOLINE) 25 MG tablet Take 25 mg by mouth 2 (two) times daily.     . hydrochlorothiazide (HYDRODIURIL) 25 MG tablet Take 25 mg by mouth every morning.    . isosorbide mononitrate (IMDUR) 60 MG 24 hr tablet TAKE 1 TABLET EVERY DAY (Patient taking differently: Take 60 mg by mouth daily.) 90 tablet 2  . ketorolac (ACULAR) 0.4 % SOLN Place 1 drop into both eyes 4 (four) times daily.    . melatonin 5 MG TABS Take 5 mg by mouth at bedtime.    . metFORMIN (GLUCOPHAGE) 1000 MG tablet Take 1 tablet (1,000 mg  total) by mouth 2 (two) times daily with a meal.    . metoprolol tartrate (LOPRESSOR) 100 MG tablet Take 1 tablet (100 mg total) by mouth 2 (two) times daily. 180 tablet 3  . MULTIPLE VITAMIN-FOLIC ACID PO Take 1 tablet by mouth daily.    . nitroGLYCERIN (NITROSTAT) 0.4 MG SL tablet Place 1 tablet (0.4 mg total) under the tongue every 5 (five) minutes as needed for chest pain. 25 tablet 3  . potassium chloride SA (K-DUR,KLOR-CON) 20 MEQ tablet Take 1 tablet (20 mEq total) by mouth daily. 15 tablet 0  . rOPINIRole (REQUIP) 1 MG tablet Take 1 mg by mouth at bedtime.    . tamsulosin (FLOMAX) 0.4 MG CAPS capsule Take 0.4 mg by mouth 2 (two) times daily.     Alveda Reasons 20 MG TABS tablet TAKE 1 TABLET (20 MG TOTAL) BY MOUTH DAILY WITH SUPPER. (Patient taking differently: Take 20 mg by mouth at bedtime.) 90 tablet 1  . neomycin-polymyxin-hydrocortisone (CORTISPORIN) 3.5-10000-1 OTIC suspension Place 3 drops into both ears 3 (three) times daily. (Patient not taking: Reported on 06/21/2020) 7.5 mL 0     Assessment: 80 y.o. male admitted with chest pain, h/o Afib and Xarelto on hold, for heparin. Last dose of Xarelto taken ~ 9 pm 3/13  3/15 AM update:  APTT low  Goal of Therapy:  APTT 66-102 sec Heparin level 0.3-0.7 units/ml Monitor platelets by anticoagulation protocol: Yes   Plan:  Inc heparin to 1200 units/hr 1200 aPTT  Narda Bonds, PharmD, BCPS Clinical Pharmacist Phone: [phone]

## 2020-06-22 NOTE — Interval H&P Note (Signed)
Cath Lab Visit (complete for each Cath Lab visit)  Clinical Evaluation Leading to the Procedure:   ACS: Yes.    Non-ACS:    Anginal Classification: CCS IV  Anti-ischemic medical therapy: Minimal Therapy (1 class of medications)  Non-Invasive Test Results: No non-invasive testing performed  Prior CABG: Previous CABG      History and Physical Interval Note:  06/22/2020 10:10 AM  Robert Burns  has presented today for surgery, with the diagnosis of chest pain.  The various methods of treatment have been discussed with the patient and family. After consideration of risks, benefits and other options for treatment, the patient has consented to  Procedure(s): LEFT HEART CATH AND CORS/GRAFTS ANGIOGRAPHY (N/A) as a surgical intervention.  The patient's history has been reviewed, patient examined, no change in status, stable for surgery.  I have reviewed the patient's chart and labs.  Questions were answered to the patient's satisfaction.     Larae Grooms

## 2020-06-23 ENCOUNTER — Other Ambulatory Visit: Payer: Self-pay | Admitting: Physician Assistant

## 2020-06-23 ENCOUNTER — Encounter (HOSPITAL_COMMUNITY): Payer: Self-pay | Admitting: Interventional Cardiology

## 2020-06-23 DIAGNOSIS — I495 Sick sinus syndrome: Secondary | ICD-10-CM

## 2020-06-23 DIAGNOSIS — I2510 Atherosclerotic heart disease of native coronary artery without angina pectoris: Secondary | ICD-10-CM

## 2020-06-23 LAB — CBC
HCT: 35.2 % — ABNORMAL LOW (ref 39.0–52.0)
Hemoglobin: 11.7 g/dL — ABNORMAL LOW (ref 13.0–17.0)
MCH: 27.9 pg (ref 26.0–34.0)
MCHC: 33.2 g/dL (ref 30.0–36.0)
MCV: 83.8 fL (ref 80.0–100.0)
Platelets: 143 10*3/uL — ABNORMAL LOW (ref 150–400)
RBC: 4.2 MIL/uL — ABNORMAL LOW (ref 4.22–5.81)
RDW: 14.6 % (ref 11.5–15.5)
WBC: 14.4 10*3/uL — ABNORMAL HIGH (ref 4.0–10.5)
nRBC: 0 % (ref 0.0–0.2)

## 2020-06-23 LAB — LIPID PANEL
Cholesterol: 72 mg/dL (ref 0–200)
HDL: 28 mg/dL — ABNORMAL LOW (ref >40)
LDL Cholesterol: 30 mg/dL (ref 0–99)
Total CHOL/HDL Ratio: 2.6 RATIO
Triglycerides: 70 mg/dL (ref <150)
VLDL: 14 mg/dL (ref 0–40)

## 2020-06-23 LAB — URINALYSIS, ROUTINE W REFLEX MICROSCOPIC
Bilirubin Urine: NEGATIVE
Glucose, UA: NEGATIVE mg/dL
Hgb urine dipstick: NEGATIVE
Ketones, ur: NEGATIVE mg/dL
Leukocytes,Ua: NEGATIVE
Nitrite: NEGATIVE
Protein, ur: NEGATIVE mg/dL
Specific Gravity, Urine: 1.024 (ref 1.005–1.030)
pH: 5 (ref 5.0–8.0)

## 2020-06-23 LAB — BASIC METABOLIC PANEL
Anion gap: 8 (ref 5–15)
BUN: 17 mg/dL (ref 8–23)
CO2: 23 mmol/L (ref 22–32)
Calcium: 9 mg/dL (ref 8.9–10.3)
Chloride: 108 mmol/L (ref 98–111)
Creatinine, Ser: 1.04 mg/dL (ref 0.61–1.24)
GFR, Estimated: >60 mL/min (ref >60)
Glucose, Bld: 118 mg/dL — ABNORMAL HIGH (ref 70–99)
Potassium: 4 mmol/L (ref 3.5–5.1)
Sodium: 139 mmol/L (ref 135–145)

## 2020-06-23 LAB — GLUCOSE, CAPILLARY
Glucose-Capillary: 105 mg/dL — ABNORMAL HIGH (ref 70–99)
Glucose-Capillary: 128 mg/dL — ABNORMAL HIGH (ref 70–99)
Glucose-Capillary: 147 mg/dL — ABNORMAL HIGH (ref 70–99)
Glucose-Capillary: 206 mg/dL — ABNORMAL HIGH (ref 70–99)

## 2020-06-23 MED ORDER — RANOLAZINE ER 500 MG PO TB12: 500.0000 mg | ORAL_TABLET | Freq: Two times a day (BID) | ORAL | 3 refills | Status: DC

## 2020-06-23 MED ORDER — METFORMIN HCL 1000 MG PO TABS: 1000.0000 mg | ORAL_TABLET | Freq: Two times a day (BID) | ORAL | Status: AC

## 2020-06-23 MED ORDER — ASPIRIN EC 81 MG PO TBEC: 81.0000 mg | DELAYED_RELEASE_TABLET | Freq: Every day | ORAL | 0 refills | Status: AC

## 2020-06-23 MED ORDER — CLOPIDOGREL BISULFATE 75 MG PO TABS: 75.0000 mg | ORAL_TABLET | Freq: Every day | ORAL | 3 refills | Status: DC

## 2020-06-23 MED FILL — Nitroglycerin IV Soln 100 MCG/ML in D5W: INTRA_ARTERIAL | Qty: 10 | Status: AC

## 2020-06-23 NOTE — Progress Notes (Addendum)
CARDIAC REHAB PHASE I   PRE:  Rate/Rhythm: 63 atrial paced  BP:  Supine: 141/62  Sitting: 147/76  Standing: 140/69   SaO2: 99%RA  MODE:  Ambulation: 470 ft   POST:  Rate/Rhythm: 120  With  paced beats  BP:  Supine:   Sitting: 156/81  Standing:    SaO2: 98%RA 0800-0900 Pt c/o dizziness lying in bed . Stated he walked to bathroom and was dizzy after lying down. Did orthostatics as documented. No dizziness with walk. Used gait belt as pt's gait is wobbly which he stated is normal for him. To sitting on side of bed after walk. Discussed NTG use, plavix, watching carbs and heart healthy food choices and modified walking instructions. Pt has attended CRP 2. Stated he is going to [facility] now. Doubtful he will attend again but referral made to [facility]. Notified cardiology of dizziness in bed and elevated HR with walk.    Graylon Good, RN BSN  06/23/2020 8:55 AM

## 2020-06-23 NOTE — Progress Notes (Addendum)
Progress Note  Patient Name: Robert Burns Date of Encounter: 06/23/2020  [facility] Cardiologist: Quay Burow, MD   Subjective   Pt states chest discomfort is much improved. He walked with cardiac rehab without issues. When getting back into bed, he became dizzy. Orthostatic vitals were obtained and he was not hypotensive with standing. He has not eaten yet this morning. I have encouraged breakfast and then re-evaluate. I think he can likely still discharge home today.  Inpatient Medications    Scheduled Meds:  amLODipine  10 mg Oral Daily   aspirin  81 mg Oral Daily   atorvastatin  80 mg Oral q1800   Chlorhexidine Gluconate Cloth  6 each Topical Q0600   cholecalciferol  2,000 Units Oral BID   clopidogrel  75 mg Oral Q breakfast   Difluprednate  1 drop Both Eyes QID   dorzolamide-timolol  1 drop Both Eyes BID   hydrALAZINE  25 mg Oral BID   insulin aspart  0-15 Units Subcutaneous TID WC   insulin aspart  0-5 Units Subcutaneous QHS   insulin aspart  4 Units Subcutaneous TID WC   isosorbide mononitrate  60 mg Oral Daily   ketorolac  1 drop Both Eyes QID   metoprolol tartrate  100 mg Oral BID   mupirocin ointment  1 application Nasal BID   potassium chloride SA  20 mEq Oral Daily   ranolazine  500 mg Oral BID   rOPINIRole  1 mg Oral Daily   sodium chloride flush  3 mL Intravenous Q12H   tamsulosin  0.4 mg Oral BID   Continuous Infusions:  sodium chloride     PRN Meds: sodium chloride, acetaminophen, nitroGLYCERIN, ondansetron (ZOFRAN) IV, sodium chloride flush   Vital Signs    Vitals:   06/22/20 1717 06/22/20 1800 06/22/20 2001 06/23/20 0333  BP: (!) 144/70 (!) 150/72 (!) 147/80 (!) 141/73  Pulse: 60 61 (!) 58 73  Resp:    17  Temp:   (!) 97.5 F (36.4 C) 98.1 F (36.7 C)  TempSrc:   Oral Oral  SpO2: 96% 98% 96% 99%  Weight:    79.8 kg  Height:        Intake/Output Summary (Last 24 hours) at 06/23/2020 0817 Last data filed at 06/23/2020 0400 Gross  per 24 hour  Intake 1703.41 ml  Output 850 ml  Net 853.41 ml   Last 3 Weights 06/23/2020 06/22/2020 06/21/2020  Weight (lbs) 175 lb 14.4 oz 176 lb 9.4 oz 177 lb 0.5 oz  Weight (kg) 79.788 kg 80.1 kg 80.3 kg      Telemetry    Paced rhythm in the 70s, episodes of NSVT - Personally Reviewed  ECG    A-paced, anterolateral infarct (0ld) - Personally Reviewed  Physical Exam   GEN: No acute distress.   Neck: No JVD Cardiac: RRR, no murmurs, rubs, or gallops.  Respiratory: Clear to auscultation bilaterally. GI: Soft, nontender, non-distended  MS: No edema; No deformity. Neuro:  Nonfocal  Psych: Normal affect  Right groin site C/D/I, no hematoma after walking  Labs    High Sensitivity Troponin:   Recent Labs  Lab 06/21/20 0102 06/21/20 0303  TROPONINIHS 7 6      Chemistry Recent Labs  Lab 06/21/20 0102 06/22/20 0051 06/23/20 0325  NA 139 137 139  K 4.0 3.5 4.0  CL 107 105 108  CO2 22 25 23   GLUCOSE 112* 131* 118*  BUN 25* 25* 17  CREATININE 0.96 1.16 1.04  CALCIUM 9.9 9.2 9.0  GFRNONAA >60 >60 >60  ANIONGAP 10 7 8      Hematology Recent Labs  Lab 06/21/20 0102 06/22/20 0051 06/23/20 0325  WBC 18.0* 16.5* 14.4*  RBC 4.77 4.27 4.20*  HGB 13.3 11.7* 11.7*  HCT 41.1 36.4* 35.2*  MCV 86.2 85.2 83.8  MCH 27.9 27.4 27.9  MCHC 32.4 32.1 33.2  RDW 14.6 14.5 14.6  PLT 167 157 143*    BNPNo results for input(s): BNP, PROBNP in the last 168 hours.   DDimer No results for input(s): DDIMER in the last 168 hours.   Radiology    CARDIAC CATHETERIZATION  Result Date: 06/22/2020  Mid LM lesion is 50% stenosed.  Ost LAD to Prox LAD lesion is 100% stenosed. LIMA to LAD is patent. Distal LAD stent appears better compared to prior study in 2020.  Ost Cx lesion is 40% stenosed.  Prox Cx lesion is 30% stenosed.  Ost 3rd Mrg to 3rd Mrg lesion is 99% stenosed. SVG to OM is occluded.  Mid Cx lesion is 75% stenosed.  Ost Ramus lesion is 95% stenosed.  Areas of in  stent restenosis in the SVG to diag graft: Prox Graft lesion is 70% stenosed, Mid Graft to Dist Graft lesion is 70% stenosed. Scoring balloon angioplasty was performed using a BALLOON WOLVERINE 3.00X10. Post intervention, there is a 0% residual stenosis in the distal stent.  Post intervention, there is a 30% residual stenosis in the proximal stent.  SVG to diagonal Mid Graft lesion is 90% stenosed.  A drug-eluting stent was successfully placed using a STENT RESOLUTE ONYX 3.0X12. IC verapamil given prior to stent placement.  Post intervention, there is a 0% residual stenosis.  Ost RCA to Prox RCA lesion is 100% stenosed.  Origin to Prox SVG to PDA Graft lesion is 80% stenosed and scoring Balloon angioplasty was performed, with a 3.25 Wolverine cutting balloon.  Post intervention, there is a 25% residual stenosis.  The left ventricular systolic function is normal.  LV end diastolic pressure is normal.  The left ventricular ejection fraction is 50-55% by visual estimate.  There is no aortic valve stenosis.  Successful PCI to the mid SVG to diagonal graft and PTCA to areas of restenosis in this graft. PTCA to the proximal SVG to RCA in stent restenosis.  Severe calcium.  If he has recurrent restenosis, consider Shockwave IVL. Plavix for 6 months.  Restart Xarelto in AM.   ECHOCARDIOGRAM COMPLETE  Result Date: 06/22/2020    ECHOCARDIOGRAM REPORT   Patient Name:   Robert Burns Date of Exam: 06/22/2020 Medical Rec #:  [id number]       Height:       67.0 in Accession #:    [id number]      Weight:       176.6 lb Date of Birth:  04/19/1940       BSA:          1.918 m Patient Age:    80 years        BP:           133/65 mmHg Patient Gender: M               HR:           61 bpm. Exam Location:  Inpatient Procedure: 2D Echo, Cardiac Doppler and Color Doppler Indications:    CAD Native Vessel  History:        Patient has prior history of Echocardiogram examinations, most  recent 01/20/2018. CAD,  Prior CABG and Pacemaker,                 Arrythmias:Atrial Fibrillation; Risk Factors:Hypertension and                 Dyslipidemia.  Sonographer:    Clayton Lefort RDCS (AE) Referring Phys: [id number] [facility] Glynna Failla IMPRESSIONS  1. Left ventricular ejection fraction, by estimation, is 55 to 60%. The left ventricle has normal function. The left ventricle demonstrates regional wall motion abnormalities (see scoring diagram/findings for description). There is mild concentric left ventricular hypertrophy. Left ventricular diastolic parameters are consistent with Grade II diastolic dysfunction (pseudonormalization). Elevated left atrial pressure. There is mild hypokinesis of the left ventricular, basal inferior wall.  2. Right ventricular systolic function is normal. The right ventricular size is normal. There is normal pulmonary artery systolic pressure. The estimated right ventricular systolic pressure is 30.1 mmHg.  3. Left atrial size was moderately dilated.  4. Right atrial size was mildly dilated.  5. The mitral valve is normal in structure. No evidence of mitral valve regurgitation. No evidence of mitral stenosis.  6. The aortic valve is normal in structure. Aortic valve regurgitation is not visualized. No aortic stenosis is present.  7. The inferior vena cava is normal in size with greater than 50% respiratory variability, suggesting right atrial pressure of 3 mmHg. Comparison(s): No significant change from prior study. Prior images reviewed side by side. The left ventricular function is unchanged. The left ventricular wall motion abnormality is unchanged. FINDINGS  Left Ventricle: Left ventricular ejection fraction, by estimation, is 55 to 60%. The left ventricle has normal function. The left ventricle demonstrates regional wall motion abnormalities. Mild hypokinesis of the left ventricular, basal inferior wall. The left ventricular internal cavity size was normal in size. There is mild concentric left  ventricular hypertrophy. Abnormal (paradoxical) septal motion consistent with post-operative status. Left ventricular diastolic parameters are consistent with Grade II diastolic dysfunction (pseudonormalization). Elevated left atrial pressure. Right Ventricle: The right ventricular size is normal. No increase in right ventricular wall thickness. Right ventricular systolic function is normal. There is normal pulmonary artery systolic pressure. The tricuspid regurgitant velocity is 2.35 m/s, and  with an assumed right atrial pressure of 8 mmHg, the estimated right ventricular systolic pressure is 60.1 mmHg. Left Atrium: Left atrial size was moderately dilated. Right Atrium: Right atrial size was mildly dilated. Pericardium: There is no evidence of pericardial effusion. Mitral Valve: The mitral valve is normal in structure. No evidence of mitral valve regurgitation. No evidence of mitral valve stenosis. MV peak gradient, 4.7 mmHg. The mean mitral valve gradient is 2.0 mmHg. Tricuspid Valve: The tricuspid valve is normal in structure. Tricuspid valve regurgitation is not demonstrated. No evidence of tricuspid stenosis. Aortic Valve: The aortic valve is normal in structure. Aortic valve regurgitation is not visualized. No aortic stenosis is present. Aortic valve mean gradient measures 2.0 mmHg. Aortic valve peak gradient measures 4.1 mmHg. Aortic valve area, by VTI measures 2.47 cm. Pulmonic Valve: The pulmonic valve was normal in structure. Pulmonic valve regurgitation is not visualized. No evidence of pulmonic stenosis. Aorta: The aortic root is normal in size and structure. Venous: The inferior vena cava is normal in size with greater than 50% respiratory variability, suggesting right atrial pressure of 3 mmHg. IAS/Shunts: No atrial level shunt detected by color flow Doppler. Additional Comments: A device lead is visualized.  LEFT VENTRICLE PLAX 2D LVIDd:  4.30 cm     Diastology LVIDs:         3.40 cm     LV  e' medial:    5.55 cm/s LV PW:         1.50 cm     LV E/e' medial:  19.1 LV IVS:        1.30 cm     LV e' lateral:   9.03 cm/s LVOT diam:     2.10 cm     LV E/e' lateral: 11.7 LV SV:         69 LV SV Index:   36 LVOT Area:     3.46 cm  LV Volumes (MOD) LV vol d, MOD A2C: 98.0 ml LV vol d, MOD A4C: 96.0 ml LV vol s, MOD A2C: 52.6 ml LV vol s, MOD A4C: 55.7 ml LV SV MOD A2C:     45.4 ml LV SV MOD A4C:     96.0 ml LV SV MOD BP:      44.3 ml RIGHT VENTRICLE             IVC RV Basal diam:  3.00 cm     IVC diam: 1.80 cm RV S prime:     10.90 cm/s TAPSE (M-mode): 1.5 cm LEFT ATRIUM             Index       RIGHT ATRIUM           Index LA diam:        4.20 cm 2.19 cm/m  RA Area:     20.40 cm LA Vol (A2C):   68.8 ml 35.87 ml/m RA Volume:   54.10 ml  28.21 ml/m LA Vol (A4C):   76.5 ml 39.88 ml/m LA Biplane Vol: 76.8 ml 40.04 ml/m  AORTIC VALVE AV Area (Vmax):    2.59 cm AV Area (Vmean):   2.44 cm AV Area (VTI):     2.47 cm AV Vmax:           101.00 cm/s AV Vmean:          75.100 cm/s AV VTI:            0.279 m AV Peak Grad:      4.1 mmHg AV Mean Grad:      2.0 mmHg LVOT Vmax:         75.40 cm/s LVOT Vmean:        52.900 cm/s LVOT VTI:          0.199 m LVOT/AV VTI ratio: 0.71  AORTA Ao Root diam: 3.80 cm Ao Asc diam:  3.50 cm MITRAL VALVE                TRICUSPID VALVE MV Area (PHT): 3.12 cm     TR Peak grad:   22.1 mmHg MV Area VTI:   1.59 cm     TR Vmax:        235.00 cm/s MV Peak grad:  4.7 mmHg MV Mean grad:  2.0 mmHg     SHUNTS MV Vmax:       1.08 m/s     Systemic VTI:  0.20 m MV Vmean:      57.6 cm/s    Systemic Diam: 2.10 cm MV Decel Time: 243 msec MV E velocity: 106.00 cm/s MV A velocity: 106.00 cm/s MV E/A ratio:  1.00 Mihai Croitoru MD Electronically signed by Sanda Klein MD Signature Date/Time: 06/22/2020/6:02:26 PM    Final  Cardiac Studies   Left heart cath 06/22/20: Mid LM lesion is 50% stenosed. Ost LAD to Prox LAD lesion is 100% stenosed. LIMA to LAD is patent. Distal LAD stent appears  better compared to prior study in 2020. Ost Cx lesion is 40% stenosed. Prox Cx lesion is 30% stenosed. Ost 3rd Mrg to 3rd Mrg lesion is 99% stenosed. SVG to OM is occluded. Mid Cx lesion is 75% stenosed. Ost Ramus lesion is 95% stenosed. Areas of in stent restenosis in the SVG to diag graft: Prox Graft lesion is 70% stenosed, Mid Graft to Dist Graft lesion is 70% stenosed. Scoring balloon angioplasty was performed using a BALLOON WOLVERINE 3.00X10. Post intervention, there is a 0% residual stenosis in the distal stent. Post intervention, there is a 30% residual stenosis in the proximal stent. SVG to diagonal Mid Graft lesion is 90% stenosed. A drug-eluting stent was successfully placed using a STENT RESOLUTE ONYX 3.0X12. IC verapamil given prior to stent placement. Post intervention, there is a 0% residual stenosis. Ost RCA to Prox RCA lesion is 100% stenosed. Origin to Prox SVG to PDA Graft lesion is 80% stenosed and scoring Balloon angioplasty was performed, with a 3.25 Wolverine cutting balloon. Post intervention, there is a 25% residual stenosis. The left ventricular systolic function is normal. LV end diastolic pressure is normal. The left ventricular ejection fraction is 50-55% by visual estimate. There is no aortic valve stenosis.   Successful PCI to the mid SVG to diagonal graft and PTCA to areas of restenosis in this graft.    PTCA to the proximal SVG to RCA in stent restenosis.  Severe calcium.  If he has recurrent restenosis, consider Shockwave IVL.    Plavix for 6 months.  Restart Xarelto in AM.    Echo 06/22/20:  1. Left ventricular ejection fraction, by estimation, is 55 to 60%. The  left ventricle has normal function. The left ventricle demonstrates  regional wall motion abnormalities (see scoring diagram/findings for  description). There is mild concentric left  ventricular hypertrophy. Left ventricular diastolic parameters are  consistent with Grade II diastolic  dysfunction (pseudonormalization).  Elevated left atrial pressure. There is mild hypokinesis of the left  ventricular, basal inferior wall.   2. Right ventricular systolic function is normal. The right ventricular  size is normal. There is normal pulmonary artery systolic pressure. The  estimated right ventricular systolic pressure is 10.1 mmHg.   3. Left atrial size was moderately dilated.   4. Right atrial size was mildly dilated.   5. The mitral valve is normal in structure. No evidence of mitral valve  regurgitation. No evidence of mitral stenosis.   6. The aortic valve is normal in structure. Aortic valve regurgitation is  not visualized. No aortic stenosis is present.   7. The inferior vena cava is normal in size with greater than 50%  respiratory variability, suggesting right atrial pressure of 3 mmHg.    Patient Profile     80 y.o. male with history of CAD s/p CABG in 2004 and multiple PCI (SVG-PDA/SVG-diag 2016, x2 to LAD, ISR of SVG-diag/SVG-OM, 2019, SVG-PDA/SVG-diag, 2020), PAD s/p SFA Stenting, SSS and AF s/p Medtronic Adapta and Xarelto (last at  < 2% burden), HTN and DM presenting with unstable angina.  Assessment & Plan    Unstable angina CAD s/p CABG (2020) with subsequent PCI - heart cath with PCI to mid SVG-diagonal, PTCA to restenosis of SVG-diagonal, PTCA to proximal SVG-RCA for ISR - consider shockwave IVL if restenosis -  recommend ASA and plavix, restart xarelto today - will drop ASA in 1 month - he tolerated the procedure well and has walked with cardiac rehab   PAD s/p SFA stent - continue plavix as above   SSS with PPM in place PAF Chronic anticoagulation - restart xarelto today   Hypertension - 10 mg amlodipine, HCTZ, imdur, BB   Hyperlipidemia with LDL goal < 70 - attempt to add-on lipid panel this morning - 80 mg lipitor - may need to consider PCSK9i based on repeat lipid panel   Chronic diastolic heart failure - echo with preserved EF,  grade 2 DD - HCTZ - low sodium diet and fluid restriction   DM with hyperglycemia - A1c 8.0% - on metformin, glipizide - consider SGLT2i   Leukocytosis - WBC 14.4 (16.5, 18.0) - CXR clear, COVID negative - enlarged prostate, but no pain or increased urgency, cath site without signs of infection - no signs of active infection    For questions or updates, please contact [facility] Please consult [email or web address] for contact info under        Signed, Ledora Bottcher, PA  06/23/2020, 8:17 AM    Personally seen and examined. Agree with APP above.  See DC summary for more details.  Rudean Haskell, MD [address], [id number] [place], [place] [address] [phone]  11:18 AM

## 2020-06-23 NOTE — Discharge Summary (Addendum)
Discharge Summary    Patient ID: Robert Burns MRN: [id number]; DOB: 12/21/1940  Admit date: 06/21/2020 Discharge date: 06/24/2020  PCP:  [name], [place]  Cardiologist:  Quay Burow, MD   Discharge Diagnoses    Principal Problem:   Unstable angina [facility]) Active Problems:   Type 2 diabetes mellitus with complication, without long-term current use of insulin ([place])   Essential hypertension   SSS (sick sinus syndrome), medtronic adapta   Hyperlipidemia due to type 2 diabetes mellitus (HCC)   OSA on CPAP   Peripheral arterial disease (HCC)   CAD, multiple vessel   Paroxysmal atrial fibrillation (HCC)   Dyslipidemia (high LDL; low HDL)   Chronic anticoagulation   Diagnostic Studies/Procedures    Left heart cath 06/22/20: Mid LM lesion is 50% stenosed. Ost LAD to Prox LAD lesion is 100% stenosed. LIMA to LAD is patent. Distal LAD stent appears better compared to prior study in 2020. Ost Cx lesion is 40% stenosed. Prox Cx lesion is 30% stenosed. Ost 3rd Mrg to 3rd Mrg lesion is 99% stenosed. SVG to OM is occluded. Mid Cx lesion is 75% stenosed. Ost Ramus lesion is 95% stenosed. Areas of in stent restenosis in the SVG to diag graft: Prox Graft lesion is 70% stenosed, Mid Graft to Dist Graft lesion is 70% stenosed. Scoring balloon angioplasty was performed using a BALLOON WOLVERINE 3.00X10. Post intervention, there is a 0% residual stenosis in the distal stent. Post intervention, there is a 30% residual stenosis in the proximal stent. SVG to diagonal Mid Graft lesion is 90% stenosed. A drug-eluting stent was successfully placed using a STENT RESOLUTE ONYX 3.0X12. IC verapamil given prior to stent placement. Post intervention, there is a 0% residual stenosis. Ost RCA to Prox RCA lesion is 100% stenosed. Origin to Prox SVG to PDA Graft lesion is 80% stenosed and scoring Balloon angioplasty was performed, with a 3.25 Wolverine cutting  balloon. Post intervention, there is a 25% residual stenosis. The left ventricular systolic function is normal. LV end diastolic pressure is normal. The left ventricular ejection fraction is 50-55% by visual estimate. There is no aortic valve stenosis.   Successful PCI to the mid SVG to diagonal graft and PTCA to areas of restenosis in this graft.    PTCA to the proximal SVG to RCA in stent restenosis.  Severe calcium.  If he has recurrent restenosis, consider Shockwave IVL.    Plavix for 6 months.  Restart Xarelto in AM.      Echo 06/22/20:  1. Left ventricular ejection fraction, by estimation, is 55 to 60%. The  left ventricle has normal function. The left ventricle demonstrates  regional wall motion abnormalities (see scoring diagram/findings for  description). There is mild concentric left  ventricular hypertrophy. Left ventricular diastolic parameters are  consistent with Grade II diastolic dysfunction (pseudonormalization).  Elevated left atrial pressure. There is mild hypokinesis of the left  ventricular, basal inferior wall.   2. Right ventricular systolic function is normal. The right ventricular  size is normal. There is normal pulmonary artery systolic pressure. The  estimated right ventricular systolic pressure is 56.3 mmHg.   3. Left atrial size was moderately dilated.   4. Right atrial size was mildly dilated.   5. The mitral valve is normal in structure. No evidence of mitral valve  regurgitation. No evidence of mitral stenosis.   6. The aortic valve is normal in structure. Aortic valve regurgitation is  not visualized. No  aortic stenosis is present.   7. The inferior vena cava is normal in size with greater than 50%  respiratory variability, suggesting right atrial pressure of 3 mmHg.    _____________   History of Present Illness     Robert Burns is a 80 y.o. male with CAD s/p CABG (2004), multiple PCI (SVG-PDA/SVG-diag 2016, x2 to LAD, ISR of SVG-diag/SVG-OM,  2019, SVG-PDA/SVG-diag, 2020), PAD, prior HFrEF with now improved EF, HTN, HLD, OSA, and DM2 who presents with recurrent chest pressure.   Robert Burns called overnight with several complaints including chest pressure, vertigo, and general malaise.  After extensive discussion his daughter said that some of his symptoms are fairly consistent with prior anginal equivalents.  He has extensive coronary history with multiple interventions and prior CABG.  Recommended that he have evaluation emergency department for further evaluate his symptoms.  ED evaluation was overall unrevealing with ECG with no ischemic changes and troponins ruled out.  Robert Burns is provides any very tangential history regarding his anginal equivalents.  Overall he thinks that he has had worsening chest pressure over the past few months but that is increased dramatically in severity and frequency over the past weeks to month.  He is now having chest pressure 3-4 times per day and is progressed to occasionally occur at rest.  On arrival to the emergency department he was having 3-4/10 severity chest pressure without radiation although sometimes it does radiate to his neck.  He had this chest pressure since 2200.  He went to feed his dogs when he noticed a gradual onset chest pressure starting her.  The first episode he had yesterday was after eating breakfast.  He says usually when he sits down it will get less severe but that he always feels with some baseline chest pressure of mild severity.  He is on low-dose Imdur but does not take as needed nitroglycerin.  He was last seen by Dr. Gwenlyn Found in October 2021 at which point he had minimal anginal symptoms per chart review.  He says however that he may have had more chest pressure that he had recorded because he thought it was a chest pain which was an issue.  His physical activity is fairly restricted as he is exhausted and chronically fatigued.  He sleeps a significant portion of the day but never  feels refreshed.  The most severe his chest pressure is, is 5/10 in the past month.  It usually occurs for 5 to 15 minutes for going away overcoming a severe.  He denies any shortness of breath, orthopnea, PND or significant lower extremity swelling.  He had significant provement in his chest pressure after nitro patch was applied in the ED.  He is on Xarelto for atrial fibrillation with last dose on 03/13.  Hospital Course     Consultants: none  Unstable angina CAD s/p CABG (2020) with subsequent PCI He presented with symptoms concerning for unstable angina. Repeat heart cath revealed areas of ISR and new areas of occlusion in the SVG-PDA and SVG-diagonal.   Stenosis in the proximal SVG-PDA treated with scoring balloon angioplasty.   Three areas of stenosis in the SVG-diagonal: proximal ISR treated with PTCA, 90% stenosis in mid vessel treated with DES, ISR in distal vessel treated with PTCA.   He tolerated the procedure well, right groin access. He will discharge on triple therapy: ASA, plavix, and xarelto. Will likely drop ASA after 30 days.   He ambulated with cardiac rehab, but became dizzy when  lying in bed. Orthostatic vitals were negative for hypotension. He does have a history of vertigo, although this episode was slightly different. He had not eaten breakfast yet - encouraged PO intake and no further episodes. Advised no driving for 1 week. He stayed overnight one more night for continued dizziness while in bed. Today, he fells better after sleeping last night. He was able to eat breakfast and lunch and now feels well.   Small stable hematoma noted on exam today. No bruits.    PAD s/p SFA stent Continue plavix as above. Risk factor modification.    SSS with PPM in place PAF Chronic anticoagulation Restart xarelto today. Will restart BB at discharge.    Hypertension Continue prior home medications: 10 mg amlodipine, HCTZ, imdur, BB   Hyperlipidemia with LDL goal < 70 -  attempt to add-on lipid panel this morning - 80 mg lipitor - may need to consider PCSK9i based on repeat lipid panel     Chronic diastolic heart failure - echo with preserved EF, grade 2 DD - HCTZ - low sodium diet and fluid restriction     DM with hyperglycemia - A1c 8.0% - on metformin, glipizide - consider SGLT2i at OP follow up   Pt prefers medications sent to [facility] mail order - please resend at follow up. I have sent new prescriptions to [facility] today.   Did the patient have an acute coronary syndrome (MI, NSTEMI, STEMI, etc) this admission?:  No                               Did the patient have a percutaneous coronary intervention (stent / angioplasty)?:  Yes.     Cath/PCI Registry Performance & Quality Measures: Aspirin prescribed? - Yes ADP Receptor Inhibitor (Plavix/Clopidogrel, Brilinta/Ticagrelor or Effient/Prasugrel) prescribed (includes medically managed patients)? - Yes High Intensity Statin (Lipitor 40-80mg  or Crestor 20-40mg ) prescribed? - Yes For EF <40%, was ACEI/ARB prescribed? - Yes For EF <40%, Aldosterone Antagonist (Spironolactone or Eplerenone) prescribed? - Not Applicable (EF >/= 93%) Cardiac Rehab Phase II ordered? - Yes     _____________  Discharge Vitals Blood pressure (!) 141/73, pulse 88, temperature 97.7 F (36.5 C), temperature source Oral, resp. rate 16, height 5\' 7"  (1.702 m), weight 80.1 kg, SpO2 97 %.  Filed Weights   06/22/20 0400 06/23/20 0333 06/24/20 0317  Weight: 80.1 kg 79.8 kg 80.1 kg    Labs & Radiologic Studies    CBC Recent Labs    06/23/20 0325 06/24/20 0250  WBC 14.4* 13.6*  HGB 11.7* 11.0*  HCT 35.2* 34.4*  MCV 83.8 85.1  PLT 143* 790*   Basic Metabolic Panel Recent Labs    06/23/20 0325 06/24/20 0250  NA 139 138  K 4.0 4.0  CL 108 108  CO2 23 23  GLUCOSE 118* 131*  BUN 17 21  CREATININE 1.04 1.08  CALCIUM 9.0 8.8*   Liver Function Tests No results for input(s): AST, ALT, ALKPHOS, BILITOT,  PROT, ALBUMIN in the last 72 hours. No results for input(s): LIPASE, AMYLASE in the last 72 hours. High Sensitivity Troponin:   Recent Labs  Lab 06/21/20 0102 06/21/20 0303  TROPONINIHS 7 6    BNP Invalid input(s): POCBNP D-Dimer No results for input(s): DDIMER in the last 72 hours. Hemoglobin A1C Recent Labs    06/21/20 1742  HGBA1C 8.0*   Fasting Lipid Panel Recent Labs    06/23/20 0325  CHOL 72  HDL 28*  LDLCALC 30  TRIG 70  CHOLHDL 2.6   Thyroid Function Tests No results for input(s): TSH, T4TOTAL, T3FREE, THYROIDAB in the last 72 hours.  Invalid input(s): FREET3 _____________  DG Chest 2 View  Result Date: 06/21/2020 CLINICAL DATA:  Chest pain EXAM: CHEST - 2 VIEW COMPARISON:  11/11/2018 FINDINGS: Post sternotomy changes. Right-sided pacing device similar in position. No focal airspace disease or effusion. Stable cardiomediastinal silhouette. No pneumothorax. IMPRESSION: No active cardiopulmonary disease. Electronically Signed   By: Donavan Foil M.D.   On: 06/21/2020 01:43   CARDIAC CATHETERIZATION  Result Date: 06/22/2020  Mid LM lesion is 50% stenosed.  Ost LAD to Prox LAD lesion is 100% stenosed. LIMA to LAD is patent. Distal LAD stent appears better compared to prior study in 2020.  Ost Cx lesion is 40% stenosed.  Prox Cx lesion is 30% stenosed.  Ost 3rd Mrg to 3rd Mrg lesion is 99% stenosed. SVG to OM is occluded.  Mid Cx lesion is 75% stenosed.  Ost Ramus lesion is 95% stenosed.  Areas of in stent restenosis in the SVG to diag graft: Prox Graft lesion is 70% stenosed, Mid Graft to Dist Graft lesion is 70% stenosed. Scoring balloon angioplasty was performed using a BALLOON WOLVERINE 3.00X10. Post intervention, there is a 0% residual stenosis in the distal stent.  Post intervention, there is a 30% residual stenosis in the proximal stent.  SVG to diagonal Mid Graft lesion is 90% stenosed.  A drug-eluting stent was successfully placed using a STENT RESOLUTE  ONYX 3.0X12. IC verapamil given prior to stent placement.  Post intervention, there is a 0% residual stenosis.  Ost RCA to Prox RCA lesion is 100% stenosed.  Origin to Prox SVG to PDA Graft lesion is 80% stenosed and scoring Balloon angioplasty was performed, with a 3.25 Wolverine cutting balloon.  Post intervention, there is a 25% residual stenosis.  The left ventricular systolic function is normal.  LV end diastolic pressure is normal.  The left ventricular ejection fraction is 50-55% by visual estimate.  There is no aortic valve stenosis.  Successful PCI to the mid SVG to diagonal graft and PTCA to areas of restenosis in this graft. PTCA to the proximal SVG to RCA in stent restenosis.  Severe calcium.  If he has recurrent restenosis, consider Shockwave IVL. Plavix for 6 months.  Restart Xarelto in AM.   ECHOCARDIOGRAM COMPLETE  Result Date: 06/22/2020    ECHOCARDIOGRAM REPORT   Patient Name:   Robert Burns Date of Exam: 06/22/2020 Medical Rec #:  [id number]       Height:       67.0 in Accession #:    [id number]      Weight:       176.6 lb Date of Birth:  11-Mar-1941       BSA:          1.918 m Patient Age:    69 years        BP:           133/65 mmHg Patient Gender: M               HR:           61 bpm. Exam Location:  Inpatient Procedure: 2D Echo, Cardiac Doppler and Color Doppler Indications:    CAD Native Vessel  History:        Patient has prior history of Echocardiogram examinations, most  recent 01/20/2018. CAD, Prior CABG and Pacemaker,                 Arrythmias:Atrial Fibrillation; Risk Factors:Hypertension and                 Dyslipidemia.  Sonographer:    Clayton Lefort RDCS (AE) Referring Phys: [id number] [facility] A Llewelyn Sheaffer IMPRESSIONS  1. Left ventricular ejection fraction, by estimation, is 55 to 60%. The left ventricle has normal function. The left ventricle demonstrates regional wall motion abnormalities (see scoring diagram/findings for description). There is mild  concentric left ventricular hypertrophy. Left ventricular diastolic parameters are consistent with Grade II diastolic dysfunction (pseudonormalization). Elevated left atrial pressure. There is mild hypokinesis of the left ventricular, basal inferior wall.  2. Right ventricular systolic function is normal. The right ventricular size is normal. There is normal pulmonary artery systolic pressure. The estimated right ventricular systolic pressure is 68.3 mmHg.  3. Left atrial size was moderately dilated.  4. Right atrial size was mildly dilated.  5. The mitral valve is normal in structure. No evidence of mitral valve regurgitation. No evidence of mitral stenosis.  6. The aortic valve is normal in structure. Aortic valve regurgitation is not visualized. No aortic stenosis is present.  7. The inferior vena cava is normal in size with greater than 50% respiratory variability, suggesting right atrial pressure of 3 mmHg. Comparison(s): No significant change from prior study. Prior images reviewed side by side. The left ventricular function is unchanged. The left ventricular wall motion abnormality is unchanged. FINDINGS  Left Ventricle: Left ventricular ejection fraction, by estimation, is 55 to 60%. The left ventricle has normal function. The left ventricle demonstrates regional wall motion abnormalities. Mild hypokinesis of the left ventricular, basal inferior wall. The left ventricular internal cavity size was normal in size. There is mild concentric left ventricular hypertrophy. Abnormal (paradoxical) septal motion consistent with post-operative status. Left ventricular diastolic parameters are consistent with Grade II diastolic dysfunction (pseudonormalization). Elevated left atrial pressure. Right Ventricle: The right ventricular size is normal. No increase in right ventricular wall thickness. Right ventricular systolic function is normal. There is normal pulmonary artery systolic pressure. The tricuspid regurgitant  velocity is 2.35 m/s, and  with an assumed right atrial pressure of 8 mmHg, the estimated right ventricular systolic pressure is 41.9 mmHg. Left Atrium: Left atrial size was moderately dilated. Right Atrium: Right atrial size was mildly dilated. Pericardium: There is no evidence of pericardial effusion. Mitral Valve: The mitral valve is normal in structure. No evidence of mitral valve regurgitation. No evidence of mitral valve stenosis. MV peak gradient, 4.7 mmHg. The mean mitral valve gradient is 2.0 mmHg. Tricuspid Valve: The tricuspid valve is normal in structure. Tricuspid valve regurgitation is not demonstrated. No evidence of tricuspid stenosis. Aortic Valve: The aortic valve is normal in structure. Aortic valve regurgitation is not visualized. No aortic stenosis is present. Aortic valve mean gradient measures 2.0 mmHg. Aortic valve peak gradient measures 4.1 mmHg. Aortic valve area, by VTI measures 2.47 cm. Pulmonic Valve: The pulmonic valve was normal in structure. Pulmonic valve regurgitation is not visualized. No evidence of pulmonic stenosis. Aorta: The aortic root is normal in size and structure. Venous: The inferior vena cava is normal in size with greater than 50% respiratory variability, suggesting right atrial pressure of 3 mmHg. IAS/Shunts: No atrial level shunt detected by color flow Doppler. Additional Comments: A device lead is visualized.  LEFT VENTRICLE PLAX 2D LVIDd:  4.30 cm     Diastology LVIDs:         3.40 cm     LV e' medial:    5.55 cm/s LV PW:         1.50 cm     LV E/e' medial:  19.1 LV IVS:        1.30 cm     LV e' lateral:   9.03 cm/s LVOT diam:     2.10 cm     LV E/e' lateral: 11.7 LV SV:         69 LV SV Index:   36 LVOT Area:     3.46 cm  LV Volumes (MOD) LV vol d, MOD A2C: 98.0 ml LV vol d, MOD A4C: 96.0 ml LV vol s, MOD A2C: 52.6 ml LV vol s, MOD A4C: 55.7 ml LV SV MOD A2C:     45.4 ml LV SV MOD A4C:     96.0 ml LV SV MOD BP:      44.3 ml RIGHT VENTRICLE             IVC  RV Basal diam:  3.00 cm     IVC diam: 1.80 cm RV S prime:     10.90 cm/s TAPSE (M-mode): 1.5 cm LEFT ATRIUM             Index       RIGHT ATRIUM           Index LA diam:        4.20 cm 2.19 cm/m  RA Area:     20.40 cm LA Vol (A2C):   68.8 ml 35.87 ml/m RA Volume:   54.10 ml  28.21 ml/m LA Vol (A4C):   76.5 ml 39.88 ml/m LA Biplane Vol: 76.8 ml 40.04 ml/m  AORTIC VALVE AV Area (Vmax):    2.59 cm AV Area (Vmean):   2.44 cm AV Area (VTI):     2.47 cm AV Vmax:           101.00 cm/s AV Vmean:          75.100 cm/s AV VTI:            0.279 m AV Peak Grad:      4.1 mmHg AV Mean Grad:      2.0 mmHg LVOT Vmax:         75.40 cm/s LVOT Vmean:        52.900 cm/s LVOT VTI:          0.199 m LVOT/AV VTI ratio: 0.71  AORTA Ao Root diam: 3.80 cm Ao Asc diam:  3.50 cm MITRAL VALVE                TRICUSPID VALVE MV Area (PHT): 3.12 cm     TR Peak grad:   22.1 mmHg MV Area VTI:   1.59 cm     TR Vmax:        235.00 cm/s MV Peak grad:  4.7 mmHg MV Mean grad:  2.0 mmHg     SHUNTS MV Vmax:       1.08 m/s     Systemic VTI:  0.20 m MV Vmean:      57.6 cm/s    Systemic Diam: 2.10 cm MV Decel Time: 243 msec MV E velocity: 106.00 cm/s MV A velocity: 106.00 cm/s MV E/A ratio:  1.00 Mihai Croitoru MD Electronically signed by Sanda Klein MD Signature Date/Time: 06/22/2020/6:02:26 PM    Final  Disposition   Pt is being discharged home today in good condition.  Follow-up Plans & Appointments     Follow-up Information     Lorretta Harp, MD Follow up on 07/02/2020.   Specialties: Cardiology, Radiology Why: 9:45 am TOC, hospital follow up Contact information: [address] [place] [address] [place] [address] [phone]                Discharge Instructions     Amb Referral to Cardiac Rehabilitation   Complete by: As directed    Diagnosis: Coronary Stents   After initial evaluation and assessments completed: Virtual Based Care may be provided alone or in conjunction with Phase 2 Cardiac Rehab based on  patient barriers.: Yes   Diet - low sodium heart healthy   Complete by: As directed    Diet - low sodium heart healthy   Complete by: As directed    Discharge instructions   Complete by: As directed    No driving for 1 week. No lifting over 5 lbs for 1 week. No sexual activity for 1 week. Keep procedure site clean & dry. If you notice increased pain, swelling, bleeding or pus, call/return!  You may shower, but no soaking baths/hot tubs/pools for 1 week.   Discharge instructions   Complete by: As directed    No driving for 1 week. No lifting over 5 lbs for 1 week. No sexual activity for 1 week. Keep procedure site clean & dry. If you notice increased pain, swelling, bleeding or pus, call/return!  You may shower, but no soaking baths/hot tubs/pools for 1 week.   Increase activity slowly   Complete by: As directed    Increase activity slowly   Complete by: As directed        Discharge Medications   Allergies as of 06/24/2020       Reactions   Peanut-containing Drug Products Anaphylaxis, Other (See Comments)   Tongue swelling is severe   Ace Inhibitors Hives, Rash, Cough      Diltiazem Hcl Other (See Comments)   UNSPECIFIED REACTION    Meloxicam Other (See Comments)   GI upset GI BLEED   Eliquis [apixaban] Other (See Comments)   dizzyness   Sertraline Hcl Other (See Comments)   Carvedilol Itching   Clonidine Hcl Other (See Comments)   Patch only - skin irritation   Duloxetine Hcl Other (See Comments)   Urinary frequency        Medication List     TAKE these medications    amLODipine 10 MG tablet Commonly known as: NORVASC Take 10 mg by mouth daily.   aspirin EC 81 MG tablet Take 1 tablet (81 mg total) by mouth daily. Swallow whole.   atorvastatin 80 MG tablet Commonly known as: LIPITOR Take 1 tablet (80 mg total) by mouth daily at 6 PM.   clopidogrel 75 MG tablet Commonly known as: PLAVIX Take 1 tablet (75 mg total) by mouth daily with breakfast.    dorzolamide-timolol 22.3-6.8 MG/ML ophthalmic solution Commonly known as: COSOPT Place 1 drop into both eyes 2 (two) times daily.   Durezol 0.05 % Emul Generic drug: Difluprednate Place 1 drop into both eyes 4 (four) times daily.   glipiZIDE 5 MG tablet Commonly known as: GLUCOTROL Take 5 mg by mouth daily with breakfast.   hydrALAZINE 25 MG tablet Commonly known as: APRESOLINE Take 25 mg by mouth 2 (two) times daily.   hydrochlorothiazide 25 MG tablet Commonly known as: HYDRODIURIL Take 25 mg  by mouth every morning.   isosorbide mononitrate 60 MG 24 hr tablet Commonly known as: IMDUR TAKE 1 TABLET EVERY DAY   ketorolac 0.4 % Soln Commonly known as: ACULAR Place 1 drop into both eyes 4 (four) times daily.   melatonin 5 MG Tabs Take 5 mg by mouth at bedtime.   metFORMIN 1000 MG tablet Commonly known as: GLUCOPHAGE Take 1 tablet (1,000 mg total) by mouth 2 (two) times daily with a meal. Restart on 3/18. What changed: additional instructions   metoprolol tartrate 100 MG tablet Commonly known as: LOPRESSOR Take 1 tablet (100 mg total) by mouth 2 (two) times daily.   MULTIPLE VITAMIN-FOLIC ACID PO Take 1 tablet by mouth daily.   neomycin-polymyxin-hydrocortisone 3.5-10000-1 OTIC suspension Commonly known as: CORTISPORIN Place 3 drops into both ears 3 (three) times daily.   nitroGLYCERIN 0.4 MG SL tablet Commonly known as: Nitrostat Place 1 tablet (0.4 mg total) under the tongue every 5 (five) minutes as needed for chest pain.   potassium chloride SA 20 MEQ tablet Commonly known as: KLOR-CON Take 1 tablet (20 mEq total) by mouth daily.   ranolazine 500 MG 12 hr tablet Commonly known as: RANEXA Take 1 tablet (500 mg total) by mouth 2 (two) times daily.   rivaroxaban 20 MG Tabs tablet Commonly known as: Xarelto Take 1 tablet (20 mg total) by mouth at bedtime. Start taking on: June 25, 2020   rOPINIRole 1 MG tablet Commonly known as: REQUIP Take 1 mg by  mouth at bedtime.   tamsulosin 0.4 MG Caps capsule Commonly known as: FLOMAX Take 0.4 mg by mouth 2 (two) times daily.   Vitamin D3 50 MCG (2000 UT) capsule Take 2,000 Units by mouth 2 (two) times daily.           Outstanding Labs/Studies   Send scripts to [facility] order  [place] would be appropriate in future evaluation.  Duration of Discharge Encounter   Greater than 30 minutes including physician time.  Signed, Tami Lin Duke, PA 06/24/2020, 12:56 PM   Personally seen and examined. Agree with APP above with the following comments: Briefly 80 yo M with CP found to have SVG stenosis s/p PCI. Planned for triple therapy for one month. Will need follow up CBC. Will check his BP daily and has wife and daughter at home for support.  Rudean Haskell, MD [place], [address] [place],  [address] [phone]  12:56 PM

## 2020-06-23 NOTE — Discharge Summary (Incomplete Revision)
Discharge Summary    Patient ID: Robert Burns MRN: [id number]; DOB: Mar 30, 1941  Admit date: 06/21/2020 Discharge date: 06/23/2020  PCP:  [name], [place]  Cardiologist:  Quay Burow, MD   Discharge Diagnoses    Principal Problem:   Unstable angina [facility]) Active Problems:   Type 2 diabetes mellitus with complication, without long-term current use of insulin ([place])   Essential hypertension   SSS (sick sinus syndrome), medtronic adapta   Hyperlipidemia due to type 2 diabetes mellitus (HCC)   OSA on CPAP   Peripheral arterial disease (HCC)   CAD, multiple vessel   Paroxysmal atrial fibrillation (HCC)   Dyslipidemia (high LDL; low HDL)   Chronic anticoagulation   Diagnostic Studies/Procedures    Left heart cath 06/22/20:  Mid LM lesion is 50% stenosed.  Ost LAD to Prox LAD lesion is 100% stenosed. LIMA to LAD is patent. Distal LAD stent appears better compared to prior study in 2020.  Ost Cx lesion is 40% stenosed.  Prox Cx lesion is 30% stenosed.  Ost 3rd Mrg to 3rd Mrg lesion is 99% stenosed. SVG to OM is occluded.  Mid Cx lesion is 75% stenosed.  Ost Ramus lesion is 95% stenosed.  Areas of in stent restenosis in the SVG to diag graft: Prox Graft lesion is 70% stenosed, Mid Graft to Dist Graft lesion is 70% stenosed. Scoring balloon angioplasty was performed using a BALLOON WOLVERINE 3.00X10. Post intervention, there is a 0% residual stenosis in the distal stent.  Post intervention, there is a 30% residual stenosis in the proximal stent.  SVG to diagonal Mid Graft lesion is 90% stenosed.  A drug-eluting stent was successfully placed using a STENT RESOLUTE ONYX 3.0X12. IC verapamil given prior to stent placement.  Post intervention, there is a 0% residual stenosis.  Ost RCA to Prox RCA lesion is 100% stenosed.  Origin to Prox SVG to PDA Graft lesion is 80% stenosed and scoring Balloon angioplasty was performed, with a  3.25 Wolverine cutting balloon.  Post intervention, there is a 25% residual stenosis.  The left ventricular systolic function is normal.  LV end diastolic pressure is normal.  The left ventricular ejection fraction is 50-55% by visual estimate.  There is no aortic valve stenosis.   Successful PCI to the mid SVG to diagonal graft and PTCA to areas of restenosis in this graft.    PTCA to the proximal SVG to RCA in stent restenosis.  Severe calcium.  If he has recurrent restenosis, consider Shockwave IVL.    Plavix for 6 months.  Restart Xarelto in AM.      Echo 06/22/20:  1. Left ventricular ejection fraction, by estimation, is 55 to 60%. The  left ventricle has normal function. The left ventricle demonstrates  regional wall motion abnormalities (see scoring diagram/findings for  description). There is mild concentric left  ventricular hypertrophy. Left ventricular diastolic parameters are  consistent with Grade II diastolic dysfunction (pseudonormalization).  Elevated left atrial pressure. There is mild hypokinesis of the left  ventricular, basal inferior wall.   2. Right ventricular systolic function is normal. The right ventricular  size is normal. There is normal pulmonary artery systolic pressure. The  estimated right ventricular systolic pressure is 60.6 mmHg.   3. Left atrial size was moderately dilated.   4. Right atrial size was mildly dilated.   5. The mitral valve is normal in structure. No evidence of mitral valve  regurgitation. No evidence of mitral  stenosis.   6. The aortic valve is normal in structure. Aortic valve regurgitation is  not visualized. No aortic stenosis is present.   7. The inferior vena cava is normal in size with greater than 50%  respiratory variability, suggesting right atrial pressure of 3 mmHg.    _____________   History of Present Illness     Robert Burns is a 80 y.o. male with CAD s/p CABG (2004), multiple PCI (SVG-PDA/SVG-diag 2016,  x2 to LAD, ISR of SVG-diag/SVG-OM, 2019, SVG-PDA/SVG-diag, 2020), PAD, prior HFrEF with now improved EF, HTN, HLD, OSA, and DM2 who presents with recurrent chest pressure.   Robert Burns called overnight with several complaints including chest pressure, vertigo, and general malaise.  After extensive discussion his daughter said that some of his symptoms are fairly consistent with prior anginal equivalents.  He has extensive coronary history with multiple interventions and prior CABG.  Recommended that he have evaluation emergency department for further evaluate his symptoms.  ED evaluation was overall unrevealing with ECG with no ischemic changes and troponins ruled out.  Robert Burns is provides any very tangential history regarding his anginal equivalents.  Overall he thinks that he has had worsening chest pressure over the past few months but that is increased dramatically in severity and frequency over the past weeks to month.  He is now having chest pressure 3-4 times per day and is progressed to occasionally occur at rest.  On arrival to the emergency department he was having 3-4/10 severity chest pressure without radiation although sometimes it does radiate to his neck.  He had this chest pressure since 2200.  He went to feed his dogs when he noticed a gradual onset chest pressure starting her.  The first episode he had yesterday was after eating breakfast.  He says usually when he sits down it will get less severe but that he always feels with some baseline chest pressure of mild severity.  He is on low-dose Imdur but does not take as needed nitroglycerin.  He was last seen by Dr. Gwenlyn Found in October 2021 at which point he had minimal anginal symptoms per chart review.  He says however that he may have had more chest pressure that he had recorded because he thought it was a chest pain which was an issue.  His physical activity is fairly restricted as he is exhausted and chronically fatigued.  He sleeps a  significant portion of the day but never feels refreshed.  The Burns severe his chest pressure is, is 5/10 in the past month.  It usually occurs for 5 to 15 minutes for going away overcoming a severe.  He denies any shortness of breath, orthopnea, PND or significant lower extremity swelling.  He had significant provement in his chest pressure after nitro patch was applied in the ED.  He is on Xarelto for atrial fibrillation with last dose on 03/13.  Hospital Course     Consultants: none  Unstable angina CAD s/p CABG (2020) with subsequent PCI He presented with symptoms concerning for unstable angina. Repeat heart cath revealed areas of ISR and new areas of occlusion in the SVG-PDA and SVG-diagonal.   Stenosis in the proximal SVG-PDA treated with scoring balloon angioplasty.   Three areas of stenosis in the SVG-diagonal: proximal ISR treated with PTCA, 90% stenosis in mid vessel treated with DES, ISR in distal vessel treated with PTCA.   He tolerated the procedure well, right groin access. He will discharge on triple therapy: ASA, plavix, and  xarelto. Will likely drop ASA after 30 days.   He ambulated with cardiac rehab, but became dizzy when lying in bed. Orthostatic vitals were negative for hypotension. He does have a history of vertigo, although this episode was slightly different. He had not eaten breakfast yet - encouraged PO intake and no further episodes. Advised no driving for 1 week.    PAD s/p SFA stent Continue plavix as above. Risk factor modification.    SSS with PPM in place PAF Chronic anticoagulation Restart xarelto today. Will restart BB at discharge.    Hypertension Continue prior home medications: 10 mg amlodipine, HCTZ, imdur, BB   Hyperlipidemia with LDL goal < 70 - attempt to add-on lipid panel this morning - 80 mg lipitor - may need to consider PCSK9i based on repeat lipid panel     Chronic diastolic heart failure - echo with preserved EF, grade 2 DD -  HCTZ - low sodium diet and fluid restriction     DM with hyperglycemia - A1c 8.0% - on metformin, glipizide - consider SGLT2i at OP follow up   Pt prefers medications sent to [facility] mail order - please resend at follow up. I have sent new prescriptions to [facility] today.   Did the patient have an acute coronary syndrome (MI, NSTEMI, STEMI, etc) this admission?:  No                               Did the patient have a percutaneous coronary intervention (stent / angioplasty)?:  Yes.     Cath/PCI Registry Performance & Quality Measures: 1. Aspirin prescribed? - Yes 2. ADP Receptor Inhibitor (Plavix/Clopidogrel, Brilinta/Ticagrelor or Effient/Prasugrel) prescribed (includes medically managed patients)? - Yes 3. High Intensity Statin (Lipitor 40-80mg  or Crestor 20-40mg ) prescribed? - Yes 4. For EF <40%, was ACEI/ARB prescribed? - Yes 5. For EF <40%, Aldosterone Antagonist (Spironolactone or Eplerenone) prescribed? - Not Applicable (EF >/= 09%) 6. Cardiac Rehab Phase II ordered? - Yes     _____________  Discharge Vitals Blood pressure (!) 141/73, pulse 73, temperature 98.1 F (36.7 C), temperature source Oral, resp. rate 17, height 5\' 7"  (1.702 m), weight 79.8 kg, SpO2 99 %.  Filed Weights   06/21/20 1736 06/22/20 0400 06/23/20 0333  Weight: 80.3 kg 80.1 kg 79.8 kg    Labs & Radiologic Studies    CBC Recent Labs    06/22/20 0051 06/23/20 0325  WBC 16.5* 14.4*  HGB 11.7* 11.7*  HCT 36.4* 35.2*  MCV 85.2 83.8  PLT 157 323*   Basic Metabolic Panel Recent Labs    06/22/20 0051 06/23/20 0325  NA 137 139  K 3.5 4.0  CL 105 108  CO2 25 23  GLUCOSE 131* 118*  BUN 25* 17  CREATININE 1.16 1.04  CALCIUM 9.2 9.0   Liver Function Tests No results for input(s): AST, ALT, ALKPHOS, BILITOT, PROT, ALBUMIN in the last 72 hours. No results for input(s): LIPASE, AMYLASE in the last 72 hours. High Sensitivity Troponin:   Recent Labs  Lab 06/21/20 0102 06/21/20 0303   TROPONINIHS 7 6    BNP Invalid input(s): POCBNP D-Dimer No results for input(s): DDIMER in the last 72 hours. Hemoglobin A1C Recent Labs    06/21/20 1742  HGBA1C 8.0*   Fasting Lipid Panel No results for input(s): CHOL, HDL, LDLCALC, TRIG, CHOLHDL, LDLDIRECT in the last 72 hours. Thyroid Function Tests No results for input(s): TSH, T4TOTAL, T3FREE, THYROIDAB in the  last 72 hours.  Invalid input(s): FREET3 _____________  DG Chest 2 View  Result Date: 06/21/2020 CLINICAL DATA:  Chest pain EXAM: CHEST - 2 VIEW COMPARISON:  11/11/2018 FINDINGS: Post sternotomy changes. Right-sided pacing device similar in position. No focal airspace disease or effusion. Stable cardiomediastinal silhouette. No pneumothorax. IMPRESSION: No active cardiopulmonary disease. Electronically Signed   By: Donavan Foil M.D.   On: 06/21/2020 01:43   CARDIAC CATHETERIZATION  Result Date: 06/22/2020  Mid LM lesion is 50% stenosed.  Ost LAD to Prox LAD lesion is 100% stenosed. LIMA to LAD is patent. Distal LAD stent appears better compared to prior study in 2020.  Ost Cx lesion is 40% stenosed.  Prox Cx lesion is 30% stenosed.  Ost 3rd Mrg to 3rd Mrg lesion is 99% stenosed. SVG to OM is occluded.  Mid Cx lesion is 75% stenosed.  Ost Ramus lesion is 95% stenosed.  Areas of in stent restenosis in the SVG to diag graft: Prox Graft lesion is 70% stenosed, Mid Graft to Dist Graft lesion is 70% stenosed. Scoring balloon angioplasty was performed using a BALLOON WOLVERINE 3.00X10. Post intervention, there is a 0% residual stenosis in the distal stent.  Post intervention, there is a 30% residual stenosis in the proximal stent.  SVG to diagonal Mid Graft lesion is 90% stenosed.  A drug-eluting stent was successfully placed using a STENT RESOLUTE ONYX 3.0X12. IC verapamil given prior to stent placement.  Post intervention, there is a 0% residual stenosis.  Ost RCA to Prox RCA lesion is 100% stenosed.  Origin to Prox  SVG to PDA Graft lesion is 80% stenosed and scoring Balloon angioplasty was performed, with a 3.25 Wolverine cutting balloon.  Post intervention, there is a 25% residual stenosis.  The left ventricular systolic function is normal.  LV end diastolic pressure is normal.  The left ventricular ejection fraction is 50-55% by visual estimate.  There is no aortic valve stenosis.  Successful PCI to the mid SVG to diagonal graft and PTCA to areas of restenosis in this graft. PTCA to the proximal SVG to RCA in stent restenosis.  Severe calcium.  If he has recurrent restenosis, consider Shockwave IVL. Plavix for 6 months.  Restart Xarelto in AM.   ECHOCARDIOGRAM COMPLETE  Result Date: 06/22/2020    ECHOCARDIOGRAM REPORT   Patient Name:   URA HAUSEN Robert Burns Date of Exam: 06/22/2020 Medical Rec #:  [id number]       Height:       67.0 in Accession #:    [id number]      Weight:       176.6 lb Date of Birth:  07-11-1940       BSA:          1.918 m Patient Age:    57 years        BP:           133/65 mmHg Patient Gender: M               HR:           61 bpm. Exam Location:  Inpatient Procedure: 2D Echo, Cardiac Doppler and Color Doppler Indications:    CAD Native Vessel  History:        Patient has prior history of Echocardiogram examinations, Burns                 recent 01/20/2018. CAD, Prior CABG and Pacemaker,  Arrythmias:Atrial Fibrillation; Risk Factors:Hypertension and                 Dyslipidemia.  Sonographer:    Clayton Lefort RDCS (AE) Referring Phys: [id number] [facility] A CHANDRASEKHAR IMPRESSIONS  1. Left ventricular ejection fraction, by estimation, is 55 to 60%. The left ventricle has normal function. The left ventricle demonstrates regional wall motion abnormalities (see scoring diagram/findings for description). There is mild concentric left ventricular hypertrophy. Left ventricular diastolic parameters are consistent with Grade II diastolic dysfunction (pseudonormalization). Elevated left atrial pressure.  There is mild hypokinesis of the left ventricular, basal inferior wall.  2. Right ventricular systolic function is normal. The right ventricular size is normal. There is normal pulmonary artery systolic pressure. The estimated right ventricular systolic pressure is 95.2 mmHg.  3. Left atrial size was moderately dilated.  4. Right atrial size was mildly dilated.  5. The mitral valve is normal in structure. No evidence of mitral valve regurgitation. No evidence of mitral stenosis.  6. The aortic valve is normal in structure. Aortic valve regurgitation is not visualized. No aortic stenosis is present.  7. The inferior vena cava is normal in size with greater than 50% respiratory variability, suggesting right atrial pressure of 3 mmHg. Comparison(s): No significant change from prior study. Prior images reviewed side by side. The left ventricular function is unchanged. The left ventricular wall motion abnormality is unchanged. FINDINGS  Left Ventricle: Left ventricular ejection fraction, by estimation, is 55 to 60%. The left ventricle has normal function. The left ventricle demonstrates regional wall motion abnormalities. Mild hypokinesis of the left ventricular, basal inferior wall. The left ventricular internal cavity size was normal in size. There is mild concentric left ventricular hypertrophy. Abnormal (paradoxical) septal motion consistent with post-operative status. Left ventricular diastolic parameters are consistent with Grade II diastolic dysfunction (pseudonormalization). Elevated left atrial pressure. Right Ventricle: The right ventricular size is normal. No increase in right ventricular wall thickness. Right ventricular systolic function is normal. There is normal pulmonary artery systolic pressure. The tricuspid regurgitant velocity is 2.35 m/s, and  with an assumed right atrial pressure of 8 mmHg, the estimated right ventricular systolic pressure is 84.1 mmHg. Left Atrium: Left atrial size was moderately  dilated. Right Atrium: Right atrial size was mildly dilated. Pericardium: There is no evidence of pericardial effusion. Mitral Valve: The mitral valve is normal in structure. No evidence of mitral valve regurgitation. No evidence of mitral valve stenosis. MV peak gradient, 4.7 mmHg. The mean mitral valve gradient is 2.0 mmHg. Tricuspid Valve: The tricuspid valve is normal in structure. Tricuspid valve regurgitation is not demonstrated. No evidence of tricuspid stenosis. Aortic Valve: The aortic valve is normal in structure. Aortic valve regurgitation is not visualized. No aortic stenosis is present. Aortic valve mean gradient measures 2.0 mmHg. Aortic valve peak gradient measures 4.1 mmHg. Aortic valve area, by VTI measures 2.47 cm. Pulmonic Valve: The pulmonic valve was normal in structure. Pulmonic valve regurgitation is not visualized. No evidence of pulmonic stenosis. Aorta: The aortic root is normal in size and structure. Venous: The inferior vena cava is normal in size with greater than 50% respiratory variability, suggesting right atrial pressure of 3 mmHg. IAS/Shunts: No atrial level shunt detected by color flow Doppler. Additional Comments: A device lead is visualized.  LEFT VENTRICLE PLAX 2D LVIDd:         4.30 cm     Diastology LVIDs:         3.40 cm     LV  e' medial:    5.55 cm/s LV PW:         1.50 cm     LV E/e' medial:  19.1 LV IVS:        1.30 cm     LV e' lateral:   9.03 cm/s LVOT diam:     2.10 cm     LV E/e' lateral: 11.7 LV SV:         69 LV SV Index:   36 LVOT Area:     3.46 cm  LV Volumes (MOD) LV vol d, MOD A2C: 98.0 ml LV vol d, MOD A4C: 96.0 ml LV vol s, MOD A2C: 52.6 ml LV vol s, MOD A4C: 55.7 ml LV SV MOD A2C:     45.4 ml LV SV MOD A4C:     96.0 ml LV SV MOD BP:      44.3 ml RIGHT VENTRICLE             IVC RV Basal diam:  3.00 cm     IVC diam: 1.80 cm RV S prime:     10.90 cm/s TAPSE (M-mode): 1.5 cm LEFT ATRIUM             Index       RIGHT ATRIUM           Index LA diam:        4.20 cm  2.19 cm/m  RA Area:     20.40 cm LA Vol (A2C):   68.8 ml 35.87 ml/m RA Volume:   54.10 ml  28.21 ml/m LA Vol (A4C):   76.5 ml 39.88 ml/m LA Biplane Vol: 76.8 ml 40.04 ml/m  AORTIC VALVE AV Area (Vmax):    2.59 cm AV Area (Vmean):   2.44 cm AV Area (VTI):     2.47 cm AV Vmax:           101.00 cm/s AV Vmean:          75.100 cm/s AV VTI:            0.279 m AV Peak Grad:      4.1 mmHg AV Mean Grad:      2.0 mmHg LVOT Vmax:         75.40 cm/s LVOT Vmean:        52.900 cm/s LVOT VTI:          0.199 m LVOT/AV VTI ratio: 0.71  AORTA Ao Root diam: 3.80 cm Ao Asc diam:  3.50 cm MITRAL VALVE                TRICUSPID VALVE MV Area (PHT): 3.12 cm     TR Peak grad:   22.1 mmHg MV Area VTI:   1.59 cm     TR Vmax:        235.00 cm/s MV Peak grad:  4.7 mmHg MV Mean grad:  2.0 mmHg     SHUNTS MV Vmax:       1.08 m/s     Systemic VTI:  0.20 m MV Vmean:      57.6 cm/s    Systemic Diam: 2.10 cm MV Decel Time: 243 msec MV E velocity: 106.00 cm/s MV A velocity: 106.00 cm/s MV E/A ratio:  1.00 Mihai Croitoru MD Electronically signed by Sanda Klein MD Signature Date/Time: 06/22/2020/6:02:26 PM    Final    Disposition   Pt is being discharged home today in good condition.  Follow-up Plans & Appointments     Follow-up  Information     Lorretta Harp, MD Follow up on 07/02/2020.   Specialties: Cardiology, Radiology Why: 9:45 am TOC, hospital follow up Contact information: [address] [place] [address] [place] [address] [phone]                Discharge Instructions     Amb Referral to Cardiac Rehabilitation   Complete by: As directed    Diagnosis: Coronary Stents   After initial evaluation and assessments completed: Virtual Based Care may be provided alone or in conjunction with Phase 2 Cardiac Rehab based on patient barriers.: Yes   Diet - low sodium heart healthy   Complete by: As directed    Discharge instructions   Complete by: As directed    No driving for 1 week. No lifting over 5  lbs for 1 week. No sexual activity for 1 week. Keep procedure site clean & dry. If you notice increased pain, swelling, bleeding or pus, call/return!  You may shower, but no soaking baths/hot tubs/pools for 1 week.   Increase activity slowly   Complete by: As directed        Discharge Medications   Allergies as of 06/23/2020       Reactions   Peanut-containing Drug Products Anaphylaxis, Other (See Comments)   Tongue swelling is severe   Ace Inhibitors Hives, Rash, Cough      Diltiazem Hcl Other (See Comments)   UNSPECIFIED REACTION    Meloxicam Other (See Comments)   GI upset GI BLEED   Eliquis [apixaban] Other (See Comments)   dizzyness   Sertraline Hcl Other (See Comments)   Carvedilol Itching   Clonidine Hcl Other (See Comments)   Patch only - skin irritation   Duloxetine Hcl Other (See Comments)   Urinary frequency        Medication List     TAKE these medications    amLODipine 10 MG tablet Commonly known as: NORVASC Take 10 mg by mouth daily.   aspirin EC 81 MG tablet Take 1 tablet (81 mg total) by mouth daily. Swallow whole.   atorvastatin 80 MG tablet Commonly known as: LIPITOR Take 1 tablet (80 mg total) by mouth daily at 6 PM.   clopidogrel 75 MG tablet Commonly known as: PLAVIX Take 1 tablet (75 mg total) by mouth daily with breakfast. Start taking on: June 24, 2020   dorzolamide-timolol 22.3-6.8 MG/ML ophthalmic solution Commonly known as: COSOPT Place 1 drop into both eyes 2 (two) times daily.   Durezol 0.05 % Emul Generic drug: Difluprednate Place 1 drop into both eyes 4 (four) times daily.   glipiZIDE 5 MG tablet Commonly known as: GLUCOTROL Take 5 mg by mouth daily with breakfast.   hydrALAZINE 25 MG tablet Commonly known as: APRESOLINE Take 25 mg by mouth 2 (two) times daily.   hydrochlorothiazide 25 MG tablet Commonly known as: HYDRODIURIL Take 25 mg by mouth every morning.   isosorbide mononitrate 60 MG 24 hr tablet Commonly  known as: IMDUR TAKE 1 TABLET EVERY DAY   ketorolac 0.4 % Soln Commonly known as: ACULAR Place 1 drop into both eyes 4 (four) times daily.   melatonin 5 MG Tabs Take 5 mg by mouth at bedtime.   metFORMIN 1000 MG tablet Commonly known as: GLUCOPHAGE Take 1 tablet (1,000 mg total) by mouth 2 (two) times daily with a meal. Restart on 3/18. What changed: additional instructions   metoprolol tartrate 100 MG tablet Commonly known as: LOPRESSOR Take 1 tablet (100 mg  total) by mouth 2 (two) times daily.   MULTIPLE VITAMIN-FOLIC ACID PO Take 1 tablet by mouth daily.   neomycin-polymyxin-hydrocortisone 3.5-10000-1 OTIC suspension Commonly known as: CORTISPORIN Place 3 drops into both ears 3 (three) times daily.   nitroGLYCERIN 0.4 MG SL tablet Commonly known as: Nitrostat Place 1 tablet (0.4 mg total) under the tongue every 5 (five) minutes as needed for chest pain.   potassium chloride SA 20 MEQ tablet Commonly known as: KLOR-CON Take 1 tablet (20 mEq total) by mouth daily.   ranolazine 500 MG 12 hr tablet Commonly known as: RANEXA Take 1 tablet (500 mg total) by mouth 2 (two) times daily.   rOPINIRole 1 MG tablet Commonly known as: REQUIP Take 1 mg by mouth at bedtime.   tamsulosin 0.4 MG Caps capsule Commonly known as: FLOMAX Take 0.4 mg by mouth 2 (two) times daily.   Vitamin D3 50 MCG (2000 UT) capsule Take 2,000 Units by mouth 2 (two) times daily.   Xarelto 20 MG Tabs tablet Generic drug: rivaroxaban TAKE 1 TABLET (20 MG TOTAL) BY MOUTH DAILY WITH SUPPER. What changed:   how much to take  when to take this           Outstanding Labs/Studies   Send scripts to [facility] mail order  [place] would be appropriate in future evaluation.  Duration of Discharge Encounter   Greater than 30 minutes including physician time.  Signed, Tami Lin Duke, PA 06/23/2020, 10:09 AM   Personally seen and examined. Agree with APP above with the following  comments: Briefly 80 yo M with CP found to have SVG stenosis s/p PCI. Planned for triple therapy for one month. Elevated WBC thought to be reactive, has improved without intervention and no red-flag symptoms. Notes some dizziness with negative orthostatics, telemetry changes, change in EF. Will check his BP daily and has wife and daughter at home for support. Appropriate for DC  Rudean Haskell, MD Cardiologist [facility]  [address] [place], [place] [address] [phone]  11:17 AM

## 2020-06-24 ENCOUNTER — Other Ambulatory Visit: Payer: Self-pay | Admitting: Physician Assistant

## 2020-06-24 DIAGNOSIS — I25810 Atherosclerosis of coronary artery bypass graft(s) without angina pectoris: Secondary | ICD-10-CM

## 2020-06-24 LAB — GLUCOSE, CAPILLARY
Glucose-Capillary: 120 mg/dL — ABNORMAL HIGH (ref 70–99)
Glucose-Capillary: 183 mg/dL — ABNORMAL HIGH (ref 70–99)

## 2020-06-24 LAB — BASIC METABOLIC PANEL
Anion gap: 7 (ref 5–15)
BUN: 21 mg/dL (ref 8–23)
CO2: 23 mmol/L (ref 22–32)
Calcium: 8.8 mg/dL — ABNORMAL LOW (ref 8.9–10.3)
Chloride: 108 mmol/L (ref 98–111)
Creatinine, Ser: 1.08 mg/dL (ref 0.61–1.24)
GFR, Estimated: >60 mL/min (ref >60)
Glucose, Bld: 131 mg/dL — ABNORMAL HIGH (ref 70–99)
Potassium: 4 mmol/L (ref 3.5–5.1)
Sodium: 138 mmol/L (ref 135–145)

## 2020-06-24 LAB — CBC
HCT: 34.4 % — ABNORMAL LOW (ref 39.0–52.0)
Hemoglobin: 11 g/dL — ABNORMAL LOW (ref 13.0–17.0)
MCH: 27.2 pg (ref 26.0–34.0)
MCHC: 32 g/dL (ref 30.0–36.0)
MCV: 85.1 fL (ref 80.0–100.0)
Platelets: 140 10*3/uL — ABNORMAL LOW (ref 150–400)
RBC: 4.04 MIL/uL — ABNORMAL LOW (ref 4.22–5.81)
RDW: 14.5 % (ref 11.5–15.5)
WBC: 13.6 10*3/uL — ABNORMAL HIGH (ref 4.0–10.5)
nRBC: 0 % (ref 0.0–0.2)

## 2020-06-24 LAB — POCT ACTIVATED CLOTTING TIME: Activated Clotting Time: 178 seconds

## 2020-06-24 MED ORDER — RIVAROXABAN 20 MG PO TABS: 20.0000 mg | ORAL_TABLET | Freq: Every day | ORAL | Status: DC

## 2020-06-24 MED ORDER — RIVAROXABAN 20 MG PO TABS
20.0000 mg | ORAL_TABLET | Freq: Every day | ORAL | Status: DC
  Administered 2020-06-24: 20 mg via ORAL
  Filled 2020-06-24: qty 1

## 2020-06-24 NOTE — Progress Notes (Addendum)
Progress Note  Patient Name: Robert Burns Date of Encounter: 06/24/2020  [facility] Cardiologist: Quay Burow, MD   Subjective   Pt slept well last night. Walked farther with cardiac rehab. No dizziness, No orthostatic hypotension. He is very tired. Will let him rest, then eat lunch, then prepare to go home and make sure he feels well prior to discharge order.   Inpatient Medications    Scheduled Meds:  amLODipine  10 mg Oral Daily   aspirin  81 mg Oral Daily   atorvastatin  80 mg Oral q1800   Chlorhexidine Gluconate Cloth  6 each Topical Q0600   cholecalciferol  2,000 Units Oral BID   clopidogrel  75 mg Oral Q breakfast   Difluprednate  1 drop Both Eyes QID   dorzolamide-timolol  1 drop Both Eyes BID   hydrALAZINE  25 mg Oral BID   insulin aspart  0-15 Units Subcutaneous TID WC   insulin aspart  0-5 Units Subcutaneous QHS   insulin aspart  4 Units Subcutaneous TID WC   isosorbide mononitrate  60 mg Oral Daily   ketorolac  1 drop Both Eyes QID   metoprolol tartrate  100 mg Oral BID   mupirocin ointment  1 application Nasal BID   potassium chloride SA  20 mEq Oral Daily   ranolazine  500 mg Oral BID   rOPINIRole  1 mg Oral Daily   sodium chloride flush  3 mL Intravenous Q12H   tamsulosin  0.4 mg Oral BID   Continuous Infusions:  sodium chloride     PRN Meds: sodium chloride, acetaminophen, nitroGLYCERIN, ondansetron (ZOFRAN) IV, sodium chloride flush   Vital Signs    Vitals:   06/23/20 0333 06/23/20 1354 06/23/20 2143 06/24/20 0317  BP: (!) 141/73 129/69 (!) 144/62 (!) 141/73  Pulse: 73 84 77 88  Resp: 17 15 19 16   Temp: 98.1 F (36.7 C) 97.6 F (36.4 C) 98.4 F (36.9 C) 97.7 F (36.5 C)  TempSrc: Oral Oral Oral Oral  SpO2: 99% 96% 97% 97%  Weight: 79.8 kg   80.1 kg  Height:        Intake/Output Summary (Last 24 hours) at 06/24/2020 0959 Last data filed at 06/24/2020 0900 Gross per 24 hour  Intake 880 ml  Output --  Net 880 ml   Last 3  Weights 06/24/2020 06/23/2020 06/22/2020  Weight (lbs) 176 lb 8 oz 175 lb 14.4 oz 176 lb 9.4 oz  Weight (kg) 80.06 kg 79.788 kg 80.1 kg      Telemetry    Paced rhythm, PVCs - Personally Reviewed  ECG    No  New tracings - Personally Reviewed  Physical Exam   GEN: No acute distress.   Neck: No JVD Cardiac: RRR, no murmurs, rubs, or gallops.  Respiratory: Clear to auscultation bilaterally. GI: Soft, nontender, non-distended  MS: No edema; No deformity. Neuro:  Nonfocal  Psych: Normal affect   Right groin - now with small hematoma  Labs    High Sensitivity Troponin:   Recent Labs  Lab 06/21/20 0102 06/21/20 0303  TROPONINIHS 7 6      Chemistry Recent Labs  Lab 06/22/20 0051 06/23/20 0325 06/24/20 0250  NA 137 139 138  K 3.5 4.0 4.0  CL 105 108 108  CO2 25 23 23   GLUCOSE 131* 118* 131*  BUN 25* 17 21  CREATININE 1.16 1.04 1.08  CALCIUM 9.2 9.0 8.8*  GFRNONAA >60 >60 >60  ANIONGAP 7 8 7  Hematology Recent Labs  Lab 06/22/20 0051 06/23/20 0325 06/24/20 0250  WBC 16.5* 14.4* 13.6*  RBC 4.27 4.20* 4.04*  HGB 11.7* 11.7* 11.0*  HCT 36.4* 35.2* 34.4*  MCV 85.2 83.8 85.1  MCH 27.4 27.9 27.2  MCHC 32.1 33.2 32.0  RDW 14.5 14.6 14.5  PLT 157 143* 140*    BNPNo results for input(s): BNP, PROBNP in the last 168 hours.   DDimer No results for input(s): DDIMER in the last 168 hours.   Radiology    CARDIAC CATHETERIZATION  Result Date: 06/22/2020  Mid LM lesion is 50% stenosed.  Ost LAD to Prox LAD lesion is 100% stenosed. LIMA to LAD is patent. Distal LAD stent appears better compared to prior study in 2020.  Ost Cx lesion is 40% stenosed.  Prox Cx lesion is 30% stenosed.  Ost 3rd Mrg to 3rd Mrg lesion is 99% stenosed. SVG to OM is occluded.  Mid Cx lesion is 75% stenosed.  Ost Ramus lesion is 95% stenosed.  Areas of in stent restenosis in the SVG to diag graft: Prox Graft lesion is 70% stenosed, Mid Graft to Dist Graft lesion is 70% stenosed.  Scoring balloon angioplasty was performed using a BALLOON WOLVERINE 3.00X10. Post intervention, there is a 0% residual stenosis in the distal stent.  Post intervention, there is a 30% residual stenosis in the proximal stent.  SVG to diagonal Mid Graft lesion is 90% stenosed.  A drug-eluting stent was successfully placed using a STENT RESOLUTE ONYX 3.0X12. IC verapamil given prior to stent placement.  Post intervention, there is a 0% residual stenosis.  Ost RCA to Prox RCA lesion is 100% stenosed.  Origin to Prox SVG to PDA Graft lesion is 80% stenosed and scoring Balloon angioplasty was performed, with a 3.25 Wolverine cutting balloon.  Post intervention, there is a 25% residual stenosis.  The left ventricular systolic function is normal.  LV end diastolic pressure is normal.  The left ventricular ejection fraction is 50-55% by visual estimate.  There is no aortic valve stenosis.  Successful PCI to the mid SVG to diagonal graft and PTCA to areas of restenosis in this graft. PTCA to the proximal SVG to RCA in stent restenosis.  Severe calcium.  If he has recurrent restenosis, consider Shockwave IVL. Plavix for 6 months.  Restart Xarelto in AM.   ECHOCARDIOGRAM COMPLETE  Result Date: 06/22/2020    ECHOCARDIOGRAM REPORT   Patient Name:   Robert Burns Coluccio Date of Exam: 06/22/2020 Medical Rec #:  [id number]       Height:       67.0 in Accession #:    [id number]      Weight:       176.6 lb Date of Birth:  Oct 29, 1940       BSA:          1.918 m Patient Age:    80 years        BP:           133/65 mmHg Patient Gender: M               HR:           61 bpm. Exam Location:  Inpatient Procedure: 2D Echo, Cardiac Doppler and Color Doppler Indications:    CAD Native Vessel  History:        Patient has prior history of Echocardiogram examinations, most                 recent 01/20/2018.  CAD, Prior CABG and Pacemaker,                 Arrythmias:Atrial Fibrillation; Risk Factors:Hypertension and                  Dyslipidemia.  Sonographer:    Clayton Lefort RDCS (AE) Referring Phys: [id number] [facility] A Seena Ritacco IMPRESSIONS  1. Left ventricular ejection fraction, by estimation, is 55 to 60%. The left ventricle has normal function. The left ventricle demonstrates regional wall motion abnormalities (see scoring diagram/findings for description). There is mild concentric left ventricular hypertrophy. Left ventricular diastolic parameters are consistent with Grade II diastolic dysfunction (pseudonormalization). Elevated left atrial pressure. There is mild hypokinesis of the left ventricular, basal inferior wall.  2. Right ventricular systolic function is normal. The right ventricular size is normal. There is normal pulmonary artery systolic pressure. The estimated right ventricular systolic pressure is 62.8 mmHg.  3. Left atrial size was moderately dilated.  4. Right atrial size was mildly dilated.  5. The mitral valve is normal in structure. No evidence of mitral valve regurgitation. No evidence of mitral stenosis.  6. The aortic valve is normal in structure. Aortic valve regurgitation is not visualized. No aortic stenosis is present.  7. The inferior vena cava is normal in size with greater than 50% respiratory variability, suggesting right atrial pressure of 3 mmHg. Comparison(s): No significant change from prior study. Prior images reviewed side by side. The left ventricular function is unchanged. The left ventricular wall motion abnormality is unchanged. FINDINGS  Left Ventricle: Left ventricular ejection fraction, by estimation, is 55 to 60%. The left ventricle has normal function. The left ventricle demonstrates regional wall motion abnormalities. Mild hypokinesis of the left ventricular, basal inferior wall. The left ventricular internal cavity size was normal in size. There is mild concentric left ventricular hypertrophy. Abnormal (paradoxical) septal motion consistent with post-operative status. Left ventricular  diastolic parameters are consistent with Grade II diastolic dysfunction (pseudonormalization). Elevated left atrial pressure. Right Ventricle: The right ventricular size is normal. No increase in right ventricular wall thickness. Right ventricular systolic function is normal. There is normal pulmonary artery systolic pressure. The tricuspid regurgitant velocity is 2.35 m/s, and  with an assumed right atrial pressure of 8 mmHg, the estimated right ventricular systolic pressure is 31.5 mmHg. Left Atrium: Left atrial size was moderately dilated. Right Atrium: Right atrial size was mildly dilated. Pericardium: There is no evidence of pericardial effusion. Mitral Valve: The mitral valve is normal in structure. No evidence of mitral valve regurgitation. No evidence of mitral valve stenosis. MV peak gradient, 4.7 mmHg. The mean mitral valve gradient is 2.0 mmHg. Tricuspid Valve: The tricuspid valve is normal in structure. Tricuspid valve regurgitation is not demonstrated. No evidence of tricuspid stenosis. Aortic Valve: The aortic valve is normal in structure. Aortic valve regurgitation is not visualized. No aortic stenosis is present. Aortic valve mean gradient measures 2.0 mmHg. Aortic valve peak gradient measures 4.1 mmHg. Aortic valve area, by VTI measures 2.47 cm. Pulmonic Valve: The pulmonic valve was normal in structure. Pulmonic valve regurgitation is not visualized. No evidence of pulmonic stenosis. Aorta: The aortic root is normal in size and structure. Venous: The inferior vena cava is normal in size with greater than 50% respiratory variability, suggesting right atrial pressure of 3 mmHg. IAS/Shunts: No atrial level shunt detected by color flow Doppler. Additional Comments: A device lead is visualized.  LEFT VENTRICLE PLAX 2D LVIDd:         4.30 cm  Diastology LVIDs:         3.40 cm     LV e' medial:    5.55 cm/s LV PW:         1.50 cm     LV E/e' medial:  19.1 LV IVS:        1.30 cm     LV e' lateral:    9.03 cm/s LVOT diam:     2.10 cm     LV E/e' lateral: 11.7 LV SV:         69 LV SV Index:   36 LVOT Area:     3.46 cm  LV Volumes (MOD) LV vol d, MOD A2C: 98.0 ml LV vol d, MOD A4C: 96.0 ml LV vol s, MOD A2C: 52.6 ml LV vol s, MOD A4C: 55.7 ml LV SV MOD A2C:     45.4 ml LV SV MOD A4C:     96.0 ml LV SV MOD BP:      44.3 ml RIGHT VENTRICLE             IVC RV Basal diam:  3.00 cm     IVC diam: 1.80 cm RV S prime:     10.90 cm/s TAPSE (M-mode): 1.5 cm LEFT ATRIUM             Index       RIGHT ATRIUM           Index LA diam:        4.20 cm 2.19 cm/m  RA Area:     20.40 cm LA Vol (A2C):   68.8 ml 35.87 ml/m RA Volume:   54.10 ml  28.21 ml/m LA Vol (A4C):   76.5 ml 39.88 ml/m LA Biplane Vol: 76.8 ml 40.04 ml/m  AORTIC VALVE AV Area (Vmax):    2.59 cm AV Area (Vmean):   2.44 cm AV Area (VTI):     2.47 cm AV Vmax:           101.00 cm/s AV Vmean:          75.100 cm/s AV VTI:            0.279 m AV Peak Grad:      4.1 mmHg AV Mean Grad:      2.0 mmHg LVOT Vmax:         75.40 cm/s LVOT Vmean:        52.900 cm/s LVOT VTI:          0.199 m LVOT/AV VTI ratio: 0.71  AORTA Ao Root diam: 3.80 cm Ao Asc diam:  3.50 cm MITRAL VALVE                TRICUSPID VALVE MV Area (PHT): 3.12 cm     TR Peak grad:   22.1 mmHg MV Area VTI:   1.59 cm     TR Vmax:        235.00 cm/s MV Peak grad:  4.7 mmHg MV Mean grad:  2.0 mmHg     SHUNTS MV Vmax:       1.08 m/s     Systemic VTI:  0.20 m MV Vmean:      57.6 cm/s    Systemic Diam: 2.10 cm MV Decel Time: 243 msec MV E velocity: 106.00 cm/s MV A velocity: 106.00 cm/s MV E/A ratio:  1.00 Mihai Croitoru MD Electronically signed by Sanda Klein MD Signature Date/Time: 06/22/2020/6:02:26 PM    Final     Cardiac Studies  Left heart cath 06/22/20: Mid LM lesion is 50% stenosed. Ost LAD to Prox LAD lesion is 100% stenosed. LIMA to LAD is patent. Distal LAD stent appears better compared to prior study in 2020. Ost Cx lesion is 40% stenosed. Prox Cx lesion is 30% stenosed. Ost 3rd Mrg to  3rd Mrg lesion is 99% stenosed. SVG to OM is occluded. Mid Cx lesion is 75% stenosed. Ost Ramus lesion is 95% stenosed. Areas of in stent restenosis in the SVG to diag graft: Prox Graft lesion is 70% stenosed, Mid Graft to Dist Graft lesion is 70% stenosed. Scoring balloon angioplasty was performed using a BALLOON WOLVERINE 3.00X10. Post intervention, there is a 0% residual stenosis in the distal stent. Post intervention, there is a 30% residual stenosis in the proximal stent. SVG to diagonal Mid Graft lesion is 90% stenosed. A drug-eluting stent was successfully placed using a STENT RESOLUTE ONYX 3.0X12. IC verapamil given prior to stent placement. Post intervention, there is a 0% residual stenosis. Ost RCA to Prox RCA lesion is 100% stenosed. Origin to Prox SVG to PDA Graft lesion is 80% stenosed and scoring Balloon angioplasty was performed, with a 3.25 Wolverine cutting balloon. Post intervention, there is a 25% residual stenosis. The left ventricular systolic function is normal. LV end diastolic pressure is normal. The left ventricular ejection fraction is 50-55% by visual estimate. There is no aortic valve stenosis.   Successful PCI to the mid SVG to diagonal graft and PTCA to areas of restenosis in this graft.    PTCA to the proximal SVG to RCA in stent restenosis.  Severe calcium.  If he has recurrent restenosis, consider Shockwave IVL.    Plavix for 6 months.  Restart Xarelto in AM.      Echo 06/22/20:  1. Left ventricular ejection fraction, by estimation, is 55 to 60%. The  left ventricle has normal function. The left ventricle demonstrates  regional wall motion abnormalities (see scoring diagram/findings for  description). There is mild concentric left  ventricular hypertrophy. Left ventricular diastolic parameters are  consistent with Grade II diastolic dysfunction (pseudonormalization).  Elevated left atrial pressure. There is mild hypokinesis of the left  ventricular,  basal inferior wall.   2. Right ventricular systolic function is normal. The right ventricular  size is normal. There is normal pulmonary artery systolic pressure. The  estimated right ventricular systolic pressure is 06.2 mmHg.   3. Left atrial size was moderately dilated.   4. Right atrial size was mildly dilated.   5. The mitral valve is normal in structure. No evidence of mitral valve  regurgitation. No evidence of mitral stenosis.   6. The aortic valve is normal in structure. Aortic valve regurgitation is  not visualized. No aortic stenosis is present.   7. The inferior vena cava is normal in size with greater than 50%  respiratory variability, suggesting right atrial pressure of 3 mmHg.     Patient Profile     80 y.o. male with history of CAD s/p CABG in 2004 and multiple PCI (SVG-PDA/SVG-diag 2016, x2 to LAD, ISR of SVG-diag/SVG-OM, 2019, SVG-PDA/SVG-diag, 2020), PAD s/p SFA Stenting, SSS and AF s/p Medtronic Adapta and Xarelto (last at  < 2% burden), HTN and DM presenting with unstable angina.   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 PAF Chronic anticoagulation - restart xarelto    Hypertension - continue home medications   Hyperlipidemia with LDL goal < 70 06/23/2020: Cholesterol 72; HDL 28; LDL Cholesterol 30; Triglycerides 70; VLDL 14 - continue 80 mg lipitor   Chronic diastolic heart failure - echo with preserved EF, grade 2 DD - continue HCTZ - appears euvolemic   DM with hyperglycemia - A1c 8.0% - on metformin  and glipizide - could consider adding SGLT2i - will defer to PCP   Leukocytosis - improved to 13.6 - UA negative - no new complaints   For questions or updates, please contact [facility] Please consult [email or web address] for contact info under        Signed, Ledora Bottcher, PA  06/24/2020, 9:59 AM    Personally seen and examined. Agree with APP above with the following comments: Briefly 80 yo M who presented with unstable angina and is s/p intervention: peridischarge felt dizzy, so we kept him into 06/24/20. No further dizziness, evidence of significant infection, small hematoma but with stable hemoglobin. Will need follow up CBC.  Discussed with patient who is amenable for discharge.

## 2020-06-24 NOTE — Progress Notes (Signed)
CARDIAC REHAB PHASE I   PRE:  Rate/Rhythm: 60 paced  regular  BP:  Supine: 161/69  Sitting: 156/74  Standing: 145/67   SaO2: 98%RA  MODE:  Ambulation: 550 ft   POST:  Rate/Rhythm: 113 paced irregular  BP:  Supine:   Sitting: 140/79  Standing:    SaO2: 99%RA [id number] Pt sleeping. Pt awakened and orthostatics taken as above. Pt assisted to bathroom prior to walk. Pt walked 550 ft on RA with hand held asst. Pt denied dizziness when up. Had pt lie down after walk as this is when he stated he gets dizzy sometimes. Still no dizziness. HR elevated with walk to 113-120 and irregular. Pt to sitting on side of bed and breakfast set up. Bed alarm on.     Graylon Good, RN BSN  06/24/2020 8:50 AM

## 2020-06-28 ENCOUNTER — Other Ambulatory Visit: Payer: Self-pay | Admitting: Cardiovascular Disease

## 2020-06-28 ENCOUNTER — Telehealth (HOSPITAL_COMMUNITY): Payer: Self-pay

## 2020-06-28 DIAGNOSIS — I25810 Atherosclerosis of coronary artery bypass graft(s) without angina pectoris: Secondary | ICD-10-CM | POA: Diagnosis not present

## 2020-06-28 NOTE — Telephone Encounter (Signed)
Pt insurance is active and benefits verified through [employer] $10, DED 0/0 met, out of pocket $3,900/$550 met, co-insurance 0%. no pre-authorization required. Passport, 06/28/2020_0 :48pm, REF# [id number]  Will contact patient to see if he is interested in the Cardiac Rehab Program. If interested, patient will need to complete follow up appt. Once completed, patient will be contacted for scheduling upon review by the RN Navigator.

## 2020-06-28 NOTE — Telephone Encounter (Signed)
Called pt to see if he is interested in the cardiac rehab program, pt stated that he didn't know at the time, I advised pt that I could give him a call back once his follow up is complete. Pt understood!

## 2020-06-29 DIAGNOSIS — Z7901 Long term (current) use of anticoagulants: Secondary | ICD-10-CM | POA: Diagnosis not present

## 2020-06-29 DIAGNOSIS — E1151 Type 2 diabetes mellitus with diabetic peripheral angiopathy without gangrene: Secondary | ICD-10-CM | POA: Diagnosis not present

## 2020-06-29 DIAGNOSIS — I25118 Atherosclerotic heart disease of native coronary artery with other forms of angina pectoris: Secondary | ICD-10-CM | POA: Diagnosis not present

## 2020-06-29 DIAGNOSIS — I5032 Chronic diastolic (congestive) heart failure: Secondary | ICD-10-CM | POA: Diagnosis not present

## 2020-06-29 DIAGNOSIS — D6869 Other thrombophilia: Secondary | ICD-10-CM | POA: Diagnosis not present

## 2020-06-29 DIAGNOSIS — I110 Hypertensive heart disease with heart failure: Secondary | ICD-10-CM | POA: Diagnosis not present

## 2020-06-29 DIAGNOSIS — I495 Sick sinus syndrome: Secondary | ICD-10-CM | POA: Diagnosis not present

## 2020-06-29 DIAGNOSIS — E1159 Type 2 diabetes mellitus with other circulatory complications: Secondary | ICD-10-CM | POA: Diagnosis not present

## 2020-06-29 DIAGNOSIS — I480 Paroxysmal atrial fibrillation: Secondary | ICD-10-CM | POA: Diagnosis not present

## 2020-06-29 LAB — CBC
Hematocrit: 38.1 % (ref 37.5–51.0)
Hemoglobin: 12.4 g/dL — ABNORMAL LOW (ref 13.0–17.7)
MCH: 27.3 pg (ref 26.6–33.0)
MCHC: 32.5 g/dL (ref 31.5–35.7)
MCV: 84 fL (ref 79–97)
Platelets: 171 10*3/uL (ref 150–450)
RBC: 4.55 x10E6/uL (ref 4.14–5.80)
RDW: 13.9 % (ref 11.6–15.4)
WBC: 17.3 10*3/uL — ABNORMAL HIGH (ref 3.4–10.8)

## 2020-07-02 ENCOUNTER — Encounter: Payer: Self-pay | Admitting: Cardiovascular Disease

## 2020-07-02 ENCOUNTER — Other Ambulatory Visit: Payer: Self-pay

## 2020-07-02 ENCOUNTER — Ambulatory Visit: Payer: Medicare HMO | Admitting: Cardiovascular Disease

## 2020-07-02 DIAGNOSIS — I480 Paroxysmal atrial fibrillation: Secondary | ICD-10-CM

## 2020-07-02 DIAGNOSIS — I25810 Atherosclerosis of coronary artery bypass graft(s) without angina pectoris: Secondary | ICD-10-CM | POA: Diagnosis not present

## 2020-07-02 DIAGNOSIS — E7800 Pure hypercholesterolemia, unspecified: Secondary | ICD-10-CM

## 2020-07-02 DIAGNOSIS — I10 Essential (primary) hypertension: Secondary | ICD-10-CM

## 2020-07-02 DIAGNOSIS — I739 Peripheral vascular disease, unspecified: Secondary | ICD-10-CM

## 2020-07-02 DIAGNOSIS — E1169 Type 2 diabetes mellitus with other specified complication: Secondary | ICD-10-CM | POA: Diagnosis not present

## 2020-07-02 DIAGNOSIS — I495 Sick sinus syndrome: Secondary | ICD-10-CM

## 2020-07-02 DIAGNOSIS — E785 Hyperlipidemia, unspecified: Secondary | ICD-10-CM | POA: Diagnosis not present

## 2020-07-02 NOTE — Assessment & Plan Note (Signed)
History of CAD status post coronary artery bypass grafting March 2004 with a LIMA to his LAD, vein to diagonal branch, sequential vein to the ramus and OM branch and vein to the PDA.  He said multiple catheterizations over the years including by Dr. Claiborne Billings 02/06/2018 with intervention of the LAD beyond LIMA insertion as well as of the diagonal and ramus branch vein grafts which were stented.  He had catheterization performed by Dr. Fletcher Anon 11/11/2018 with stenting of the proximal diagonal branch vein graft and distal PDA vein graft.  He was recently admitted with unstable angina 06/21/2020 and underwent angiography by Dr. Irish Lack via the femoral approach and stenting of his ramus branch vein graft in the mid and proximal portion as well as of the ostium of the PDA vein graft.  He feels clinically improved.  The plan was for "triple therapy with aspirin, Plavix and Xarelto for 30 days after which the aspirin will be discontinued

## 2020-07-02 NOTE — Assessment & Plan Note (Signed)
History of hyperlipidemia on statin therapy with lipid profile performed 06/23/2020 revealing a total cholesterol of 72, LDL 30 and HDL of 28.

## 2020-07-02 NOTE — Patient Instructions (Signed)
Medication Instructions:  Your physician recommends that you continue on your current medications as directed. Please refer to the Current Medication list given to you today.  *If you need a refill on your cardiac medications before your next appointment, please call your pharmacy*   Testing/Procedures: Your physician has requested that you have a lower extremity arterial duplex. This test is an ultrasound of the arteries in the legs. It looks at arterial blood flow in the legs. Allow one hour for Lower Arterial scans. There are no restrictions or special instructions To be done in Sept 2022  Your physician has requested that you have an ankle brachial index (ABI). During this test an ultrasound and blood pressure cuff are used to evaluate the arteries that supply the arms and legs with blood. Allow thirty minutes for this exam. There are no restrictions or special instructions. To be done in Sept 2022  Your physician has requested that you have a carotid duplex. This test is an ultrasound of the carotid arteries in your neck. It looks at blood flow through these arteries that supply the brain with blood. Allow one hour for this exam. There are no restrictions or special instructions. To be done in Dec 2022  These procedures are done at [place]. 2nd Floor.   Follow-Up: At [facility], you and your health needs are our priority.  As part of our continuing mission to provide you with exceptional heart care, we have created designated Provider Care Teams.  These Care Teams include your primary Cardiologist (physician) and Advanced Practice Providers (APPs -  Physician Assistants and Nurse Practitioners) who all work together to provide you with the care you need, when you need it.  We recommend signing up for the patient portal called "MyChart".  Sign up information is provided on this After Visit Summary.  MyChart is used to connect with patients for Virtual Visits (Telemedicine).   Patients are able to view lab/test results, encounter notes, upcoming appointments, etc.  Non-urgent messages can be sent to your provider as well.   To learn more about what you can do with MyChart, go to [email or web address].    Your next appointment:   3 month(s)  The format for your next appointment:   In Person  Provider:   You will see one of the following Advanced Practice Providers on your designated Care Team:    Sande Rives, PA-C  Coletta Memos, FNP  Then, Quay Burow, MD will plan to see you again in 6 month(s).

## 2020-07-02 NOTE — Progress Notes (Signed)
07/02/2020 Robert Burns   10/14/1940  [id number]  Primary Physician Robert Orn, MD Primary Cardiologist: Robert Harp MD Robert Burns, [name]  HPI:  Robert Burns is a 80 y.o.  married Caucasian male, father of 2, grandfather to 3 grandchildren whose wife Robert Burns who is also a patient of mine. I last saw him in theoffice  01/09/2020.[name]  He is adopted by Robert Burns, one of his close family friends.  He is accompanied by one of his daughters Robert Burns today.  He has a history of CAD status post coronary artery bypass grafting March 2004 with a LIMA to his LAD, a vein to a diagonal branch, a sequential vein to a ramus and OM branch, as well as a vein to the PDA. Last functional study performed July 28, 2011, was entirely normal. 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 He had a pacemaker placed for sick sinus syndrome November 2008 followed by Robert Burns. He has obstructive sleep apnea on CPAP. He complain of left thigh pain and I angiogram'd him revealing patent arteries though he did have a space-occupying lesion which was removed by Robert Burns with placement of an interposition 6-mm Gore-Tex graft. The pathology was uncertain. 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 Dr. Baxter Burns follows his lipid profile.Since I saw him back 11/07/12 he has done well. Followup Dopplers performed in our office 09/27/12 revealed a high-grade lesion in the distal right SFA with an ABI of 0.3. His left ABI 1.1 without obstructive disease. His most recent lower extremity Doppler Dopplers performed 9/32/16 revealed a right ABI 0.82 And a left ABI of 0.89. Over the last 3 months he's noticed anginal chest pain occurring both at rest and with exertion with left upper extremity radiation. He also complains of left lower extremity discomfort. to moderate anterolateral ischemia. Because of ongoing chest pain and a Myoview that showed anterolateral ischemia and he underwent cardiac  catheterization on 02/01/15 revealing high grade segmental proximal right SVG disease and subtotally occluded diagonal branch SVG. He underwent stenting of his RCA SVG successfully. He does have continued chest pain although somewhat improved since his last procedure. I brought him back for staged diagonal branch SVG intervention on 03/01/15. This was successful and since that time he denies chest pain or shortness of breath. He underwent a generator change by Robert Burns in January for end-of-life of his Medtronic pacemaker.Since I saw him 6 months ago he developed new left calf claudication of the last 3 months. Lower extremity arterial Doppler studies performed today revealed a decline in his left ABI from 0.87 down to 0.59 with an occluded left SFA that is a new finding. 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 He had a short segment occlusion of the proximal left SFA with patent mid left SFA stent. He had 90% segmental calcified mid right SFA stenosis with three-vessel runoff bilaterally. I ended up stenting his left external iliac artery with a 9 mm x 40 mm long nitinol self-expanding stent which improved his symptoms somewhat although he continuedto have lifestyle limiting claudication. He underwent staged intervention of his left SFA on 01/03/16. I stented the entire quadrant totally occluded segment with a 6 mm x 250 mm long Viabahn Stent. His claudication on the left has resolved and his ABIs normalized. He now complains of right leg  medication. I did demonstrate a 90% calcified segmental mid right SFA stenosis at the time of angiography 11/25/15. He underwent diamondback orbital rotation atherectomy/drug-eluting angioplasty of his mid calcified right SFA by myself 02/14/16. This follow-up Dopplers performed 02/24/16 revealed normal ABIs bilaterally.  His claudication has improved. When I saw him in the office possibly 3 weeks ago he was complaining of fairly new onset substernal chest pain over the prior several months occurring several times a week. These were new since his RCA and diagonal branch vein graft interventions at the end of 2016. He is on good antianginal medications. A Myoview stress test was ordered that showed subtle inferolateral ischemia and echo showed normal LV function.[name]  He was admitted to the hospital 02/06/2018 for 3 days with unstable angina. He underwent catheterization by Dr. Claiborne Burns on 02/06/2018 revealing disease in all 3 vein grafts as well as in the LAD beyond LIMA insertion, 2 days later he underwent complex intervention on his diagonal and ramus branch vein grafts with restenting as well as intervention on his LAD via LIMA insertion. His RCA vein graft was not intervened on. He currently denies angina or claudication.  Hewas admitted to the hospital with unstable angina on 11/11/2018. Enzymes were negative. He underwent catheterization and intervention by Dr. Fletcher Burns 11/12/2018 via the left radial approach with stenting of his proximal diagonal branch vein graft in his distal PDA vein graft. Since discharge, his anginal symptoms have significantly improved although he still has a "heaviness feeling" in his chest. 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 Donnetta Burns.  Robert Burns did identify significant A. fib burden on remote interrogation of his pacemaker and he was subsequent begun on Eliquis which was transitioned to Xarelto.  He gets occasional chest pain not requiring sublingual nitroglycerin.  His most recent lower extremity arterial Doppler studies revealed patent left iliac and SFAs bilaterally.  Carotid Dopplers performed last December showed moderate right ICA stenosis.  Since I saw him 6 months ago he was admitted to the hospital  with unstable angina 06/21/2020 and underwent diagnostic coronary angiography by Dr. Irish Lack via the femoral approach with intervention of his ramus branch vein graft and PDA vein graft with excellent result.  Since discharge on 06/24/2020 he feels clinically improved.   Current Meds  Medication Sig  . amLODipine (NORVASC) 10 MG tablet Take 10 mg by mouth daily.  [name] aspirin EC 81 MG tablet Take 1 tablet (81 mg total) by mouth daily. Swallow whole.  [name] atorvastatin (LIPITOR) 80 MG tablet Take 1 tablet (80 mg total) by mouth daily at 6 PM.  . Cholecalciferol (VITAMIN D3) 2000 units capsule Take 2,000 Units by mouth 2 (two) times daily.  . clopidogrel (PLAVIX) 75 MG tablet Take 1 tablet (75 mg total) by mouth daily with breakfast.  . dorzolamide-timolol (COSOPT) 22.3-6.8 MG/ML ophthalmic solution Place 1 drop into both eyes 2 (two) times daily.  . DUREZOL 0.05 % EMUL Place 1 drop into both eyes 4 (four) times daily.  [name] glipiZIDE (GLUCOTROL) 5 MG tablet Take 5 mg by mouth daily with breakfast.   . hydrALAZINE (APRESOLINE) 25 MG tablet Take 25 mg by mouth 2 (two) times daily.   . hydrochlorothiazide (HYDRODIURIL) 25 MG tablet Take 25 mg by mouth every morning.  . isosorbide mononitrate (IMDUR) 60 MG 24 hr tablet TAKE 1 TABLET EVERY DAY (Patient taking differently: Take 60 mg by mouth daily.)  . ketorolac (ACULAR) 0.4 %  SOLN Place 1 drop into both eyes 4 (four) times daily.  . melatonin 5 MG TABS Take 5 mg by mouth at bedtime.  . metFORMIN (GLUCOPHAGE) 1000 MG tablet Take 1 tablet (1,000 mg total) by mouth 2 (two) times daily with a meal. Restart on 3/18.  [name] metoprolol tartrate (LOPRESSOR) 100 MG tablet Take 1 tablet (100 mg total) by mouth 2 (two) times daily.  . MULTIPLE VITAMIN-FOLIC ACID PO Take 1 tablet by mouth daily.  [name] neomycin-polymyxin-hydrocortisone (CORTISPORIN) 3.5-10000-1 OTIC suspension Place 3 drops into both ears 3 (three) times daily.  . nitroGLYCERIN (NITROSTAT) 0.4 MG SL tablet Place  1 tablet (0.4 mg total) under the tongue every 5 (five) minutes as needed for chest pain.  . potassium chloride SA (K-DUR,KLOR-CON) 20 MEQ tablet Take 1 tablet (20 mEq total) by mouth daily.  . ranolazine (RANEXA) 500 MG 12 hr tablet Take 1 tablet (500 mg total) by mouth 2 (two) times daily.  . rivaroxaban (XARELTO) 20 MG TABS tablet Take 1 tablet (20 mg total) by mouth at bedtime.  [name] rOPINIRole (REQUIP) 1 MG tablet Take 1 mg by mouth at bedtime.  . tamsulosin (FLOMAX) 0.4 MG CAPS capsule Take 0.4 mg by mouth 2 (two) times daily.      Allergies  Allergen Reactions  . Peanut-Containing Drug Products Anaphylaxis and Other (See Comments)    Tongue swelling is severe   . Ace Inhibitors Hives, Rash and Cough       . Diltiazem Hcl Other (See Comments)    UNSPECIFIED REACTION    . Meloxicam Other (See Comments)    GI upset GI BLEED   . Eliquis [Apixaban] Other (See Comments)    dizzyness  . Sertraline Hcl Other (See Comments)  . Carvedilol Itching  . Clonidine Hcl Other (See Comments)    Patch only - skin irritation  . Duloxetine Hcl Other (See Comments)    Urinary frequency    Social History   Socioeconomic History  . Marital status: Married    Spouse name: Not on file  . Number of children: Not on file  . Years of education: Not on file  . Highest education level: Not on file  Occupational History  . Not on file  Tobacco Use  . Smoking status: Former Smoker    Years: 10.00    Types: Pipe    Quit date: 1973    Years since quitting: 49.2  . Smokeless tobacco: Never Used  . Tobacco comment: "quit smoking in 1973"  Vaping Use  . Vaping Use: Never used  Substance and Sexual Activity  . Alcohol use: No  . Drug use: No  . Sexual activity: Not Currently  Other Topics Concern  . Not on file  Social History Narrative  . Not on file   Social Determinants of Health   Financial Resource Strain: Not on file  Food Insecurity: Not on file  Transportation Needs: Not on  file  Physical Activity: Not on file  Stress: Not on file  Social Connections: Not on file  Intimate Partner Violence: Not on file     Review of Systems: General: negative for chills, fever, night sweats or weight changes.  Cardiovascular: negative for chest pain, dyspnea on exertion, edema, orthopnea, palpitations, paroxysmal nocturnal dyspnea or shortness of breath Dermatological: negative for rash Respiratory: negative for cough or wheezing Urologic: negative for hematuria Abdominal: negative for nausea, vomiting, diarrhea, bright red blood per rectum, melena, or hematemesis Neurologic: negative for visual changes, syncope, or  dizziness All other systems reviewed and are otherwise negative except as noted above.    Blood pressure 122/62, pulse 92, height 5\' 7"  (1.702 m), weight 176 lb 3.2 oz (79.9 kg), SpO2 95 %.  General appearance: alert and no distress Neck: no adenopathy, no carotid bruit, no JVD, supple, symmetrical, trachea midline and thyroid not enlarged, symmetric, no tenderness/mass/nodules Lungs: clear to auscultation bilaterally Heart: regular rate and rhythm, S1, S2 normal, no murmur, click, rub or gallop Extremities: extremities normal, atraumatic, no cyanosis or edema Pulses: 2+ and symmetric Skin: Skin color, texture, turgor normal. No rashes or lesions Neurologic: Alert and oriented X 3, normal strength and tone. Normal symmetric reflexes. Normal coordination and gait  EKG not performed today  ASSESSMENT AND PLAN:   Essential hypertension History of essential hypertension a blood pressure measured today at 122/62.  He is on amlodipine, hydralazine and metoprolol.  SSS (sick sinus syndrome), medtronic adapta History of sick sinus syndrome status post permanent transvenous pacemaker inserted 10/13/2005 followed by Robert Burns.  Hyperlipidemia due to type 2 diabetes mellitus ([place]) History of hyperlipidemia on atorvastatin with lipid profile performed 06/23/2020  revealing total cholesterol 72, LDL 30 and HDL 28.  CAD (coronary artery disease) of bypass graft History of CAD status post coronary artery bypass grafting March 2004 with a LIMA to his LAD, vein to diagonal branch, sequential vein to the ramus and OM branch and vein to the PDA.  He said multiple catheterizations over the years including by Dr. Claiborne Burns 02/06/2018 with intervention of the LAD beyond LIMA insertion as well as of the diagonal and ramus branch vein grafts which were stented.  He had catheterization performed by Dr. Fletcher Burns 11/11/2018 with stenting of the proximal diagonal branch vein graft and distal PDA vein graft.  He was recently admitted with unstable angina 06/21/2020 and underwent angiography by Dr. Irish Lack via the femoral approach and stenting of his ramus branch vein graft in the mid and proximal portion as well as of the ostium of the PDA vein graft.  He feels clinically improved.  The plan was for "triple therapy with aspirin, Plavix and Xarelto for 30 days after which the aspirin will be discontinued  Peripheral arterial disease [facility]) ED status post left SFA stenting by myself 10/30/2002.  He has had bypass by Robert Burns with a interposition 6 mm Gore-Tex graft in the past.  He underwent angiography by myself 11/25/2015 with demonstration of a 90% distal left external iliac artery stenosis just proximal to the previously placed interposition graft with a short segment occlusion in his proximal left SFA and the patient left SFA stent.  He had a 90% calcified segmental mid right SFA stenosis with three-vessel runoff.  I ended up stenting his left external iliac artery with a 9 mm x 40 mm long nitinol self-expanding stent.  He underwent staged intervention of his left SFA by myself 01/03/2016 with stenting of his entire included left SFA with a Viabahn stent.  He then underwent diamondback orbital rotational angiography atherectomy of the right SFA 02/14/2016.  His most recent Dopplers performed  last year revealed his circulation to be patent and he really denies claudication.  Hypercholesterolemia History of hyperlipidemia on statin therapy with lipid profile performed 06/23/2020 revealing a total cholesterol of 72, LDL 30 and HDL of 28.  Paroxysmal atrial fibrillation (HCC) History of PAF on Xarelto oral anticoagulation.      Robert Harp MD FACP,FACC,FAHA, [facility] 07/02/2020 10:06 AM

## 2020-07-02 NOTE — Assessment & Plan Note (Signed)
History of sick sinus syndrome status post permanent transvenous pacemaker inserted 10/13/2005 followed by Dr. Sallyanne Kuster.

## 2020-07-02 NOTE — Assessment & Plan Note (Signed)
History of PAF on Xarelto oral anticoagulation.

## 2020-07-02 NOTE — Assessment & Plan Note (Signed)
History of essential hypertension a blood pressure measured today at 122/62.  He is on amlodipine, hydralazine and metoprolol.

## 2020-07-02 NOTE — Assessment & Plan Note (Signed)
History of hyperlipidemia on atorvastatin with lipid profile performed 06/23/2020 revealing total cholesterol 72, LDL 30 and HDL 28.

## 2020-07-02 NOTE — Assessment & Plan Note (Signed)
ED status post left SFA stenting by myself 10/30/2002.  He has had bypass by Dr. Kellie Simmering with a interposition 6 mm Gore-Tex graft in the past.  He underwent angiography by myself 11/25/2015 with demonstration of a 90% distal left external iliac artery stenosis just proximal to the previously placed interposition graft with a short segment occlusion in his proximal left SFA and the patient left SFA stent.  He had a 90% calcified segmental mid right SFA stenosis with three-vessel runoff.  I ended up stenting his left external iliac artery with a 9 mm x 40 mm long nitinol self-expanding stent.  He underwent staged intervention of his left SFA by myself 01/03/2016 with stenting of his entire included left SFA with a Viabahn stent.  He then underwent diamondback orbital rotational angiography atherectomy of the right SFA 02/14/2016.  His most recent Dopplers performed last year revealed his circulation to be patent and he really denies claudication.

## 2020-07-07 DIAGNOSIS — H35352 Cystoid macular degeneration, left eye: Secondary | ICD-10-CM | POA: Diagnosis not present

## 2020-07-07 DIAGNOSIS — H348112 Central retinal vein occlusion, right eye, stable: Secondary | ICD-10-CM | POA: Diagnosis not present

## 2020-07-07 DIAGNOSIS — H35372 Puckering of macula, left eye: Secondary | ICD-10-CM | POA: Diagnosis not present

## 2020-07-20 DIAGNOSIS — F331 Major depressive disorder, recurrent, moderate: Secondary | ICD-10-CM | POA: Diagnosis not present

## 2020-07-20 DIAGNOSIS — T82858D Stenosis of vascular prosthetic devices, implants and grafts, subsequent encounter: Secondary | ICD-10-CM | POA: Diagnosis not present

## 2020-07-20 DIAGNOSIS — G4733 Obstructive sleep apnea (adult) (pediatric): Secondary | ICD-10-CM | POA: Diagnosis not present

## 2020-07-20 DIAGNOSIS — Z955 Presence of coronary angioplasty implant and graft: Secondary | ICD-10-CM | POA: Diagnosis not present

## 2020-07-20 DIAGNOSIS — Z7984 Long term (current) use of oral hypoglycemic drugs: Secondary | ICD-10-CM | POA: Diagnosis not present

## 2020-07-20 DIAGNOSIS — E1151 Type 2 diabetes mellitus with diabetic peripheral angiopathy without gangrene: Secondary | ICD-10-CM | POA: Diagnosis not present

## 2020-07-20 DIAGNOSIS — J9610 Chronic respiratory failure, unspecified whether with hypoxia or hypercapnia: Secondary | ICD-10-CM | POA: Diagnosis not present

## 2020-07-20 DIAGNOSIS — I5032 Chronic diastolic (congestive) heart failure: Secondary | ICD-10-CM | POA: Diagnosis not present

## 2020-07-20 DIAGNOSIS — Z7901 Long term (current) use of anticoagulants: Secondary | ICD-10-CM | POA: Diagnosis not present

## 2020-07-20 DIAGNOSIS — I110 Hypertensive heart disease with heart failure: Secondary | ICD-10-CM | POA: Diagnosis not present

## 2020-07-20 DIAGNOSIS — Z7902 Long term (current) use of antithrombotics/antiplatelets: Secondary | ICD-10-CM | POA: Diagnosis not present

## 2020-07-21 ENCOUNTER — Other Ambulatory Visit: Payer: Self-pay

## 2020-07-21 DIAGNOSIS — I10 Essential (primary) hypertension: Secondary | ICD-10-CM

## 2020-07-27 ENCOUNTER — Ambulatory Visit (INDEPENDENT_AMBULATORY_CARE_PROVIDER_SITE_OTHER): Payer: Medicare HMO

## 2020-07-27 DIAGNOSIS — I495 Sick sinus syndrome: Secondary | ICD-10-CM

## 2020-07-28 ENCOUNTER — Telehealth: Payer: Self-pay | Admitting: Cardiovascular Disease

## 2020-07-28 LAB — CUP PACEART REMOTE DEVICE CHECK
Battery Impedance: 407 Ohm
Battery Remaining Longevity: 99 mo
Battery Voltage: 2.79 V
Brady Statistic AP VP Percent: 2 %
Brady Statistic AP VS Percent: 98 %
Brady Statistic AS VP Percent: 0 %
Brady Statistic AS VS Percent: 0 %
Date Time Interrogation Session: 20220420134849
Implantable Lead Implant Date: 20070706
Implantable Lead Implant Date: 20070706
Implantable Lead Location: 753859
Implantable Lead Location: 753860
Implantable Lead Model: 5076
Implantable Lead Model: 5092
Implantable Pulse Generator Implant Date: 20170113
Lead Channel Impedance Value: 417 Ohm
Lead Channel Impedance Value: 691 Ohm
Lead Channel Pacing Threshold Amplitude: 0.875 V
Lead Channel Pacing Threshold Amplitude: 1.625 V
Lead Channel Pacing Threshold Pulse Width: 0.4 ms
Lead Channel Pacing Threshold Pulse Width: 0.4 ms
Lead Channel Setting Pacing Amplitude: 1.875
Lead Channel Setting Pacing Amplitude: 3.25 V
Lead Channel Setting Pacing Pulse Width: 0.4 ms
Lead Channel Setting Sensing Sensitivity: 5.6 mV

## 2020-07-28 NOTE — Telephone Encounter (Signed)
*  STAT* If patient is at the pharmacy, call can be transferred to refill team.   1. Which medications need to be refilled? (please list name of each medication and dose if known) ranolazine (RANEXA) 500 MG 12 hr tablet clopidogrel (PLAVIX) 75 MG tablet  2. Which pharmacy/location (including street and city if local pharmacy) is medication to be sent to? [facility], [place]  3. Do they need a 30 day or 90 day supply? [place]

## 2020-07-29 ENCOUNTER — Other Ambulatory Visit (HOSPITAL_COMMUNITY): Payer: Self-pay

## 2020-08-04 ENCOUNTER — Other Ambulatory Visit: Payer: Self-pay | Admitting: *Deleted

## 2020-08-04 DIAGNOSIS — I10 Essential (primary) hypertension: Secondary | ICD-10-CM

## 2020-08-05 DIAGNOSIS — F320 Major depressive disorder, single episode, mild: Secondary | ICD-10-CM | POA: Diagnosis not present

## 2020-08-05 DIAGNOSIS — E7800 Pure hypercholesterolemia, unspecified: Secondary | ICD-10-CM | POA: Diagnosis not present

## 2020-08-05 DIAGNOSIS — I10 Essential (primary) hypertension: Secondary | ICD-10-CM | POA: Diagnosis not present

## 2020-08-05 DIAGNOSIS — M160 Bilateral primary osteoarthritis of hip: Secondary | ICD-10-CM | POA: Diagnosis not present

## 2020-08-05 DIAGNOSIS — E1151 Type 2 diabetes mellitus with diabetic peripheral angiopathy without gangrene: Secondary | ICD-10-CM | POA: Diagnosis not present

## 2020-08-05 DIAGNOSIS — F330 Major depressive disorder, recurrent, mild: Secondary | ICD-10-CM | POA: Diagnosis not present

## 2020-08-05 DIAGNOSIS — E1142 Type 2 diabetes mellitus with diabetic polyneuropathy: Secondary | ICD-10-CM | POA: Diagnosis not present

## 2020-08-05 DIAGNOSIS — I25709 Atherosclerosis of coronary artery bypass graft(s), unspecified, with unspecified angina pectoris: Secondary | ICD-10-CM | POA: Diagnosis not present

## 2020-08-05 DIAGNOSIS — I480 Paroxysmal atrial fibrillation: Secondary | ICD-10-CM | POA: Diagnosis not present

## 2020-08-05 LAB — CBC
Hematocrit: 37.9 % (ref 37.5–51.0)
Hemoglobin: 12 g/dL — ABNORMAL LOW (ref 13.0–17.7)
MCH: 27 pg (ref 26.6–33.0)
MCHC: 31.7 g/dL (ref 31.5–35.7)
MCV: 85 fL (ref 79–97)
Platelets: 185 10*3/uL (ref 150–450)
RBC: 4.45 x10E6/uL (ref 4.14–5.80)
RDW: 13.8 % (ref 11.6–15.4)
WBC: 12.8 10*3/uL — ABNORMAL HIGH (ref 3.4–10.8)

## 2020-08-09 ENCOUNTER — Telehealth (HOSPITAL_COMMUNITY): Payer: Self-pay

## 2020-08-09 ENCOUNTER — Encounter (HOSPITAL_COMMUNITY): Payer: Self-pay

## 2020-08-09 NOTE — Telephone Encounter (Signed)
Attempted to call patient in regards to Cardiac Rehab - LM on VM Mailed letter 

## 2020-08-12 ENCOUNTER — Telehealth: Payer: Self-pay

## 2020-08-12 NOTE — Telephone Encounter (Signed)
Left message for pt to call back  °

## 2020-08-13 ENCOUNTER — Other Ambulatory Visit: Payer: Self-pay

## 2020-08-13 ENCOUNTER — Telehealth: Payer: Self-pay

## 2020-08-13 NOTE — Progress Notes (Signed)
Remote pacemaker transmission.   

## 2020-08-13 NOTE — Telephone Encounter (Signed)
Spoke with pt regarding triple therapy. Pt has been on triple therapy with aspirin, plavix, and xarelto for more that 30 days now. Per Dr. Gwenlyn Found the aspirin discontinued and pt will remain on plavix and xarelto. Per most recent CBC, counts look excellent with no signs of bleeding. Pt verbalizes understanding.

## 2020-08-13 NOTE — Progress Notes (Signed)
Pt to d/c aspirin therapy. See telephone encounter.

## 2020-08-16 DIAGNOSIS — D485 Neoplasm of uncertain behavior of skin: Secondary | ICD-10-CM | POA: Diagnosis not present

## 2020-08-16 DIAGNOSIS — R233 Spontaneous ecchymoses: Secondary | ICD-10-CM | POA: Diagnosis not present

## 2020-08-16 DIAGNOSIS — D225 Melanocytic nevi of trunk: Secondary | ICD-10-CM | POA: Diagnosis not present

## 2020-08-16 DIAGNOSIS — L570 Actinic keratosis: Secondary | ICD-10-CM | POA: Diagnosis not present

## 2020-08-17 DIAGNOSIS — Z961 Presence of intraocular lens: Secondary | ICD-10-CM | POA: Diagnosis not present

## 2020-08-17 DIAGNOSIS — H35352 Cystoid macular degeneration, left eye: Secondary | ICD-10-CM | POA: Diagnosis not present

## 2020-08-17 DIAGNOSIS — H34811 Central retinal vein occlusion, right eye, with macular edema: Secondary | ICD-10-CM | POA: Diagnosis not present

## 2020-08-17 DIAGNOSIS — H35372 Puckering of macula, left eye: Secondary | ICD-10-CM | POA: Diagnosis not present

## 2020-08-23 NOTE — Telephone Encounter (Signed)
No response from pt.  Closed referral  

## 2020-08-24 DIAGNOSIS — I25709 Atherosclerosis of coronary artery bypass graft(s), unspecified, with unspecified angina pectoris: Secondary | ICD-10-CM | POA: Diagnosis not present

## 2020-08-24 DIAGNOSIS — I480 Paroxysmal atrial fibrillation: Secondary | ICD-10-CM | POA: Diagnosis not present

## 2020-08-24 DIAGNOSIS — E1151 Type 2 diabetes mellitus with diabetic peripheral angiopathy without gangrene: Secondary | ICD-10-CM | POA: Diagnosis not present

## 2020-08-24 DIAGNOSIS — E7800 Pure hypercholesterolemia, unspecified: Secondary | ICD-10-CM | POA: Diagnosis not present

## 2020-08-24 DIAGNOSIS — G4733 Obstructive sleep apnea (adult) (pediatric): Secondary | ICD-10-CM | POA: Diagnosis not present

## 2020-08-24 DIAGNOSIS — I739 Peripheral vascular disease, unspecified: Secondary | ICD-10-CM | POA: Diagnosis not present

## 2020-08-24 DIAGNOSIS — D6869 Other thrombophilia: Secondary | ICD-10-CM | POA: Diagnosis not present

## 2020-08-24 DIAGNOSIS — Z0000 Encounter for general adult medical examination without abnormal findings: Secondary | ICD-10-CM | POA: Diagnosis not present

## 2020-08-24 DIAGNOSIS — E1142 Type 2 diabetes mellitus with diabetic polyneuropathy: Secondary | ICD-10-CM | POA: Diagnosis not present

## 2020-08-24 DIAGNOSIS — N401 Enlarged prostate with lower urinary tract symptoms: Secondary | ICD-10-CM | POA: Diagnosis not present

## 2020-09-14 DIAGNOSIS — S62511B Displaced fracture of proximal phalanx of right thumb, initial encounter for open fracture: Secondary | ICD-10-CM | POA: Diagnosis not present

## 2020-09-17 DIAGNOSIS — H35372 Puckering of macula, left eye: Secondary | ICD-10-CM | POA: Diagnosis not present

## 2020-09-17 DIAGNOSIS — Z961 Presence of intraocular lens: Secondary | ICD-10-CM | POA: Diagnosis not present

## 2020-09-17 DIAGNOSIS — H34811 Central retinal vein occlusion, right eye, with macular edema: Secondary | ICD-10-CM | POA: Diagnosis not present

## 2020-09-17 DIAGNOSIS — S61011A Laceration without foreign body of right thumb without damage to nail, initial encounter: Secondary | ICD-10-CM | POA: Diagnosis not present

## 2020-09-17 DIAGNOSIS — H43813 Vitreous degeneration, bilateral: Secondary | ICD-10-CM | POA: Diagnosis not present

## 2020-09-17 DIAGNOSIS — H35353 Cystoid macular degeneration, bilateral: Secondary | ICD-10-CM | POA: Diagnosis not present

## 2020-09-21 NOTE — Progress Notes (Signed)
Cardiology Clinic Note   Patient Name: Robert Burns Date of Encounter: 09/23/2020  Primary Care Provider:  [name], MD Primary Cardiologist:  Quay Burow, MD  Patient Profile    Robert Burns 80 year old male presents the clinic today for follow-up evaluation of his essential hypertension and sick sinus syndrome.  Past Medical History    Past Medical History:  Diagnosis Date   Arthritis    "all over" (11/25/2015)   CAD (coronary artery disease)    a. s/p CABG  06/2002; b. 02/01/15 PCI: DES to prox SVG to PDA, staged PCI of SVG to Diag in 02/2015; c. 04/2017 Cath/PCI: LM nl, LAD 100ost, 91m, 75d, LCX 60ost, OM2 80, RCA 100ost, RPDA 80, LIMA->LAD ok, VG->D1 patent stent, VG->RPDA patent stent, 50p, VG->OM1->OM2 90p (3.0x24 Synergy DES), 100 between OM1->OM2 (med rx).   Cancer [facility])    Right Shoulder, Left Leg- BCC, SCC, AND MELANOMA   Epithelioid hemangioendothelioma    s/p resection of left SFA/mass with interposition 6 mm GoreTex graft 06/02/10, s/p repeat resection for positive margins 09/22/10   Hyperlipidemia    Hypertension    Leg pain    OSA on CPAP    PAD (peripheral artery disease) ([place])    a. 10/2002 L SFA PTA/BMS; b. 8/17 LE Angio: LEIA 90 (9x40 self exp stent), LSFA short segment prox occlusion (staged PTA/stenting 01/03/2016), patent mid stent, RSFA 50m (staged PTA/DEB 02/14/2016).   Presence of permanent cardiac pacemaker    Medtronic   SSS (sick sinus syndrome) ([place])    a. s/p PPM in 2007 with gen change 04/2015 - Medtronic Adapta ADDRL1, ser # [id number].   Type II diabetes mellitus (HCC)    Type II   Urgency of urination    Past Surgical History:  Procedure Laterality Date   CARDIAC CATHETERIZATION N/A 02/01/2015   Procedure: Left Heart Cath and Coronary Angiography;  Surgeon: Lorretta Harp, MD;  Location: [place] CV LAB;  Service: Cardiovascular;  Laterality: N/A;   CARDIAC CATHETERIZATION N/A 02/01/2015   Procedure: Coronary Stent  Intervention;  Surgeon: Lorretta Harp, MD;  Location: [place] CV LAB;  Service: Cardiovascular;  Laterality: N/A;   CARDIAC CATHETERIZATION  06/2002   "just before bypass OR"   CARDIAC CATHETERIZATION N/A 03/01/2015   Procedure: Coronary Stent Intervention;  Surgeon: Lorretta Harp, MD;  Location: [place] CV LAB;  Service: Cardiovascular;  Laterality: N/A;   CARDIAC CATHETERIZATION  02/06/2018   COLONOSCOPY     CORONARY ANGIOPLASTY     CORONARY ARTERY BYPASS GRAFT  06/2002   x5, LIMA-LAD;VG- Diag; seq VG- ramus & OM branch; VG-PDA   CORONARY STENT INTERVENTION N/A 04/12/2017   Procedure: CORONARY STENT INTERVENTION;  Surgeon: Lorretta Harp, MD;  Location: [place] CV LAB;  Service: Cardiovascular;  Laterality: N/A;   CORONARY STENT INTERVENTION N/A 02/08/2018   Procedure: CORONARY STENT INTERVENTION;  Surgeon: Jettie Booze, MD;  Location: [place] [facility] LAB;  Service: Cardiovascular;  Laterality: N/A;   CORONARY STENT INTERVENTION N/A 11/12/2018   Procedure: CORONARY STENT INTERVENTION;  Surgeon: Wellington Hampshire, MD;  Location: [place] CV LAB;  Service: Cardiovascular;  Laterality: N/A;   CORONARY STENT INTERVENTION N/A 06/22/2020   Procedure: CORONARY STENT INTERVENTION;  Surgeon: Jettie Booze, MD;  Location: [place] CV LAB;  Service: Cardiovascular;  Laterality: N/A;   EP IMPLANTABLE DEVICE N/A 04/23/2015   Procedure: PPM Generator Changeout;  Surgeon: Sanda Klein, MD;  Location: [place] CV LAB;  Service: Cardiovascular;  Laterality: N/A;   FALSE ANEURYSM REPAIR Left 11/29/2018   Procedure: REPAIR FALSE ANEURYSM LEFT RADIAL ARTERY;  Surgeon: [name], MD;  Location: [place];  Service: Vascular;  Laterality: Left;   FEMORAL ARTERY STENT Left ~ 2014   "taken out of my leg; couldn' catorgorize what kind so the put it under all 3"; cataroziepitheloid hemanioendotheliomau   FOOT FRACTURE SURGERY Left [place] / REPLACE /  REMOVE PACEMAKER  10/13/05   right side, medtronic Adapta   KNEE HARDWARE REMOVAL Right 1950's   "3-4 months after the insertion"   KNEE SURGERY Right 1950's   "broke my lower leg; had to put pin in my knee to keep lower leg in place til it healed"   LEFT HEART CATH AND CORS/GRAFTS ANGIOGRAPHY N/A 04/12/2017   Procedure: LEFT HEART CATH AND CORS/GRAFTS ANGIOGRAPHY;  Surgeon: Lorretta Harp, MD;  Location: [place] CV LAB;  Service: Cardiovascular;  Laterality: N/A;   LEFT HEART CATH AND CORS/GRAFTS ANGIOGRAPHY N/A 02/06/2018   Procedure: LEFT HEART CATH AND CORS/GRAFTS ANGIOGRAPHY;  Surgeon: Troy Sine, MD;  Location: [place] CV LAB;  Service: Cardiovascular;  Laterality: N/A;   LEFT HEART CATH AND CORS/GRAFTS ANGIOGRAPHY N/A 11/12/2018   Procedure: LEFT HEART CATH AND CORS/GRAFTS ANGIOGRAPHY;  Surgeon: Wellington Hampshire, MD;  Location: [place] CV LAB;  Service: Cardiovascular;  Laterality: N/A;   LEFT HEART CATH AND CORS/GRAFTS ANGIOGRAPHY N/A 06/22/2020   Procedure: LEFT HEART CATH AND CORS/GRAFTS ANGIOGRAPHY;  Surgeon: Jettie Booze, MD;  Location: [place] [facility] LAB;  Service: Cardiovascular;  Laterality: N/A;   PERIPHERAL VASCULAR CATHETERIZATION N/A 11/25/2015   Procedure: Lower Extremity Angiography;  Surgeon: Lorretta Harp, MD;  Location: [place] [facility] LAB;  Service: Cardiovascular;  Laterality: N/A;   PERIPHERAL VASCULAR CATHETERIZATION Left 11/25/2015   Procedure: Peripheral Vascular Intervention;  Surgeon: Lorretta Harp, MD;  Location: [place] CV LAB;  Service: Cardiovascular;  Laterality: Left;  external iliac   PERIPHERAL VASCULAR CATHETERIZATION N/A 01/03/2016   Procedure: Lower Extremity Angiography;  Surgeon: Lorretta Harp, MD;  Location: [place] CV LAB;  Service: Cardiovascular;  Laterality: N/A;   PERIPHERAL VASCULAR CATHETERIZATION Left 01/03/2016   Procedure: Peripheral Vascular Intervention;  Surgeon: Lorretta Harp, MD;  Location: [place] CV LAB;  Service: Cardiovascular;  Laterality: Left;  SFA   PERIPHERAL VASCULAR CATHETERIZATION Right 02/14/2016   Procedure: Peripheral Vascular Atherectomy;  Surgeon: Lorretta Harp, MD;  Location: [place] CV LAB;  Service: Cardiovascular;  Laterality: Right;  SFA   POPLITEAL ARTERY STENT  01/03/2016   Contralateral access with a 7 [place] crossover sheath (second order catheter placement)   TONSILLECTOMY AND ADENOIDECTOMY     TUMOR EXCISION Right ~ 2005   cancerous tumor removed from shoulder   TUMOR EXCISION Right ~ 2000   benign tumor removed from under shoulder   TUMOR EXCISION Left 06/02/2010   resection of Lt SFA wth interposition of Gore-Tex graft    Allergies  Allergies  Allergen Reactions   Peanut-Containing Drug Products Anaphylaxis and Other (See Comments)    Tongue swelling is severe    Ace Inhibitors Hives, Rash and Cough        Diltiazem Hcl Other (See Comments)    UNSPECIFIED REACTION     Meloxicam Other (See Comments)    GI upset GI BLEED    Eliquis [Apixaban] Other (See Comments)  dizzyness   Sertraline Hcl Other (See Comments)   Carvedilol Itching   Clonidine Hcl Other (See Comments)    Patch only - skin irritation   Duloxetine Hcl Other (See Comments)    Urinary frequency    History of Present Illness    Robert Burns has a PMH of essential hypertension, sick sinus syndrome status post PPM 11/08, coronary artery disease status post CABG 06/2002 (LIMA-LAD, SVG-diagonal, and SVG to ramus and OM branch and PDA.)  PAD status post SFA stenting 7/04 and diamondback over rotational angiography arthrectomy right SFA 11/17 and left SFA stenting 9/17.  OSA on CPAP, HLD, paroxysmal atrial fibrillation.  He has had multiple cardiac catheterizations with interventions to his LAD diagonal and ramus branch with stents in 2019, stenting of his proximal diagonal vein graft and distal PDA vein graft 11/2018, and stenting of his ramus branch in the mid and  proximal portion as well has ostium of his PDA by Dr.Varanasi 06/21/2020.  He was placed on triple therapy aspirin Plavix and Xarelto for 30 days with a plan to discontinue aspirin after that time.  He presents the clinic today for follow-up evaluation states he has noticed episodes of dizziness over the last 2-3 days.  He reports that he stays well-hydrated.  He reports compliance with all of his blood pressure medication.  He has been very physically active removing popcorn ceiling from his home.  He also goes to the [facility] for water aerobics 3 times per week.  He has his thumb bandaged due to a lacerations from a pocket knife.  He reports that it is healing well.  He reports eating a low-salt diet.  I will change his hydralazine to as needed, have him maintain his blood pressure/blood pressure log, maintain his physical activity and follow-up in 6 months.  Today he denies chest pain, shortness of breath, lower extremity edema, fatigue, palpitations, melena, hematuria, hemoptysis, diaphoresis, weakness, presyncope, syncope, orthopnea, and PND.   Home Medications    Prior to Admission medications   Medication Sig Start Date End Date Taking? Authorizing Provider  amLODipine (NORVASC) 10 MG tablet Take 10 mg by mouth daily.    [provider]  atorvastatin (LIPITOR) 80 MG tablet Take 1 tablet (80 mg total) by mouth daily at 6 PM. 02/09/18   Eileen Stanford, PA-C  Cholecalciferol (VITAMIN D3) 2000 units capsule Take 2,000 Units by mouth 2 (two) times daily.    [provider]  clopidogrel (PLAVIX) 75 MG tablet Take 1 tablet (75 mg total) by mouth daily with breakfast. 06/24/20   Duke, Tami Lin, PA  clopidogrel (PLAVIX) 75 MG tablet TAKE 1 TABLET (75 MG TOTAL) BY MOUTH DAILY WITH BREAKFAST. 06/23/20 06/23/21  Duke, Tami Lin, PA  dorzolamide-timolol (COSOPT) 22.3-6.8 MG/ML ophthalmic solution Place 1 drop into both eyes 2 (two) times daily. 04/30/20   [provider]   DUREZOL 0.05 % EMUL Place 1 drop into both eyes 4 (four) times daily. 06/01/20   [provider]  glipiZIDE (GLUCOTROL) 5 MG tablet Take 5 mg by mouth daily with breakfast.  01/10/18   [provider]  hydrALAZINE (APRESOLINE) 25 MG tablet Take 25 mg by mouth 2 (two) times daily.     [provider]  hydrochlorothiazide (HYDRODIURIL) 25 MG tablet Take 25 mg by mouth every morning.    [provider]  isosorbide mononitrate (IMDUR) 60 MG 24 hr tablet TAKE 1 TABLET EVERY DAY Patient taking differently: Take 60 mg by mouth  daily. 12/01/19   Lorretta Harp, MD  ketorolac (ACULAR) 0.4 % SOLN Place 1 drop into both eyes 4 (four) times daily. 06/01/20   [provider]  melatonin 5 MG TABS Take 5 mg by mouth at bedtime.    [provider]  metFORMIN (GLUCOPHAGE) 1000 MG tablet Take 1 tablet (1,000 mg total) by mouth 2 (two) times daily with a meal. Restart on 3/18. 06/23/20   Duke, Tami Lin, PA  metoprolol tartrate (LOPRESSOR) 100 MG tablet Take 1 tablet (100 mg total) by mouth 2 (two) times daily. 08/07/17   Lendon Colonel, NP  MULTIPLE VITAMIN-FOLIC ACID PO Take 1 tablet by mouth daily.    [provider]  neomycin-polymyxin-hydrocortisone (CORTISPORIN) 3.5-10000-1 OTIC suspension Place 3 drops into both ears 3 (three) times daily. 02/01/20   Kandra Nicolas, MD  nitroGLYCERIN (NITROSTAT) 0.4 MG SL tablet Place 1 tablet (0.4 mg total) under the tongue every 5 (five) minutes as needed for chest pain. 04/15/17   Theora Gianotti, NP  potassium chloride SA (K-DUR,KLOR-CON) 20 MEQ tablet Take 1 tablet (20 mEq total) by mouth daily. 02/18/16   Regalado, Belkys A, MD  ranolazine (RANEXA) 500 MG 12 hr tablet Take 1 tablet (500 mg total) by mouth 2 (two) times daily. 06/23/20   Duke, Tami Lin, PA  rivaroxaban (XARELTO) 20 MG TABS tablet Take 1 tablet (20 mg total) by mouth at bedtime. 06/25/20   Duke, Tami Lin, PA  rOPINIRole  (REQUIP) 1 MG tablet Take 1 mg by mouth at bedtime.    [provider]  tamsulosin (FLOMAX) 0.4 MG CAPS capsule Take 0.4 mg by mouth 2 (two) times daily.  03/09/15   [provider]    Family History    Family History  Problem Relation Age of Onset   Coronary artery disease Mother    Heart attack Mother    Hypertension Mother    Heart disease Mother        Open  Heart surgery   Diabetes Father    Heart disease Father    Hyperlipidemia Father    Heart attack Father    Hypertension Sister    Diabetes Sister    Heart disease Sister    He indicated that his mother is deceased. He indicated that his father is deceased. He indicated that his sister is alive. He indicated that his maternal grandmother is deceased. He indicated that his maternal grandfather is deceased. He indicated that his paternal grandmother is deceased. He indicated that his paternal grandfather is deceased.  Social History    Social History   Socioeconomic History   Marital status: Married    Spouse name: Not on file   Number of children: Not on file   Years of education: Not on file   Highest education level: Not on file  Occupational History   Not on file  Tobacco Use   Smoking status: Former    Pack years: 0.00    Types: Pipe    Quit date: 74    Years since quitting: 49.4   Smokeless tobacco: Never   Tobacco comments:    "quit smoking in 1973"  Vaping Use   Vaping Use: Never used  Substance and Sexual Activity   Alcohol use: No   Drug use: No   Sexual activity: Not Currently  Other Topics Concern   Not on file  Social History Narrative   Not on file   Social Determinants of Health   Financial  Resource Strain: Not on file  Food Insecurity: Not on file  Transportation Needs: Not on file  Physical Activity: Not on file  Stress: Not on file  Social Connections: Not on file  Intimate Partner Violence: Not on file     Review of Systems    General:  No chills, fever,  night sweats or weight changes.  Cardiovascular:  No chest pain, dyspnea on exertion, edema, orthopnea, palpitations, paroxysmal nocturnal dyspnea. Dermatological: No rash, lesions/masses Respiratory: No cough, dyspnea Urologic: No hematuria, dysuria Abdominal:   No nausea, vomiting, diarrhea, bright red blood per rectum, melena, or hematemesis Neurologic:  No visual changes, wkns, changes in mental status. All other systems reviewed and are otherwise negative except as noted above.  Physical Exam    VS:  BP (!) 110/54 (BP Location: Right Arm, Patient Position: Sitting, Cuff Size: Normal)   Pulse 86   Ht 5\' 7"  (1.702 m)   Wt 168 lb 9.6 oz (76.5 kg)   SpO2 95%   BMI 26.41 kg/m  , BMI Body mass index is 26.41 kg/m. GEN: Well nourished, well developed, in no acute distress. HEENT: normal. Neck: Supple, no JVD, carotid bruits, or masses. Cardiac: RRR, no murmurs, rubs, or gallops. No clubbing, cyanosis, edema.  Radials/DP/PT 2+ and equal bilaterally.  Respiratory:  Respirations regular and unlabored, clear to auscultation bilaterally. GI: Soft, nontender, nondistended, BS + x 4. MS: no deformity or atrophy. Skin: warm and dry, no rash.  Healing left thumb laceration Neuro:  Strength and sensation are intact. Psych: Normal affect.  Accessory Clinical Findings    Recent Labs: 06/24/2020: BUN 21; Creatinine, Ser 1.08; Potassium 4.0; Sodium 138 08/04/2020: Hemoglobin 12.0; Platelets 185   Recent Lipid Panel    Component Value Date/Time   CHOL 72 06/23/2020 0325   TRIG 70 06/23/2020 0325   HDL 28 (L) 06/23/2020 0325   CHOLHDL 2.6 06/23/2020 0325   VLDL 14 06/23/2020 0325   LDLCALC 30 06/23/2020 0325    ECG personally reviewed by me today-none today.  Echocardiogram 06/22/2020 IMPRESSIONS     1. Left ventricular ejection fraction, by estimation, is 55 to 60%. The  left ventricle has normal function. The left ventricle demonstrates  regional wall motion abnormalities (see  scoring diagram/findings for  description). There is mild concentric left  ventricular hypertrophy. Left ventricular diastolic parameters are  consistent with Grade II diastolic dysfunction (pseudonormalization).  Elevated left atrial pressure. There is mild hypokinesis of the left  ventricular, basal inferior wall.   2. Right ventricular systolic function is normal. The right ventricular  size is normal. There is normal pulmonary artery systolic pressure. The  estimated right ventricular systolic pressure is 54.6 mmHg.   3. Left atrial size was moderately dilated.   4. Right atrial size was mildly dilated.   5. The mitral valve is normal in structure. No evidence of mitral valve  regurgitation. No evidence of mitral stenosis.   6. The aortic valve is normal in structure. Aortic valve regurgitation is  not visualized. No aortic stenosis is present.   7. The inferior vena cava is normal in size with greater than 50%  respiratory variability, suggesting right atrial pressure of 3 mmHg.   Comparison(s): No significant change from prior study. Prior images  reviewed side by side. The left ventricular function is unchanged. The  left ventricular wall motion abnormality is unchanged.   Cardiac catheterization 06/22/2020 Mid LM lesion is 50% stenosed. Ost LAD to Prox LAD lesion is 100% stenosed. LIMA  to LAD is patent. Distal LAD stent appears better compared to prior study in 2020. Ost Cx lesion is 40% stenosed. Prox Cx lesion is 30% stenosed. Ost 3rd Mrg to 3rd Mrg lesion is 99% stenosed. SVG to OM is occluded. Mid Cx lesion is 75% stenosed. Ost Ramus lesion is 95% stenosed. Areas of in stent restenosis in the SVG to diag graft: Prox Graft lesion is 70% stenosed, Mid Graft to Dist Graft lesion is 70% stenosed. Scoring balloon angioplasty was performed using a BALLOON WOLVERINE 3.00X10. Post intervention, there is a 0% residual stenosis in the distal stent. Post intervention, there is a 30%  residual stenosis in the proximal stent. SVG to diagonal Mid Graft lesion is 90% stenosed. A drug-eluting stent was successfully placed using a STENT RESOLUTE ONYX 3.0X12. IC verapamil given prior to stent placement. Post intervention, there is a 0% residual stenosis. Ost RCA to Prox RCA lesion is 100% stenosed. Origin to Prox SVG to PDA Graft lesion is 80% stenosed and scoring Balloon angioplasty was performed, with a 3.25 Wolver[place] cutting balloon. Post intervention, there is a 25% residual stenosis. The left ventricular systolic function is normal. LV end diastolic pressure is normal. The left ventricular ejection fraction is 50-55% by visual estimate. There is no aortic valve stenosis.   Successful PCI to the mid SVG to diagonal graft and PTCA to areas of restenosis in this graft.   PTCA to the proximal SVG to RCA in stent restenosis.  Severe calcium.  If he has recurrent restenosis, consider Shockwave IVL.   Plavix for 6 months.  Restart Xarelto in AM.  Diagnostic Dominance: Right    Intervention      Assessment & Plan   1.  Coronary artery disease-no chest pain today.  No recent episodes of chest pressure, arm neck or back discomfort.  Underwent CABG x4 in 2004 followed by multiple cardiac catheterizations.  Last cardiac catheterization 06/21/2020 with stenting to his ramus branch vein graft and mid and proximal portions as well as the ostium of the PDA vein graft.  Details above.  He was placed on triple therapy aspirin Plavix and Xarelto for 30 days and then aspirin was discontinued. Continue Plavix, Xarelto, amlodipine, atorvastatin, hydralazine, hydrochlorothiazide, Imdur, metoprolol, nitroglycerin, Ranexa Heart healthy low-sodium diet-salty 6 given Increase physical activity as tolerated  Hyperlipidemia-06/23/2020: Cholesterol 72; HDL 28; LDL Cholesterol 30; Triglycerides 70; VLDL 14 Continue Plavix, atorvastatin Heart healthy low-sodium high-fiber diet Increase  physical activity as tolerated  Essential hypertension-BP today 110/54.  Reports episodes of lightheadedness/dizziness over the last 2-3 days. Continue amlodipine,  metoprolol, hydrochlorothiazide, Imdur Change hydralazine to as needed-for blood pressure greater than 390 systolic or 90 diastolic Heart healthy low-sodium diet-salty 6 given Increase physical activity as tolerated  Peripheral arterial disease-denies claudication.  Underwent SFA stenting by Dr. Gwenlyn Found 10/30/2002.  Had bypass by Dr. Kellie Simmering.  Underwent angiography 8/17 and had stenting of his left external iliac artery.  He underwent staged intervention of his left SFA 9/17 with stenting of his entire left SFA.  He had angiography and arthrectomy right SFA 11/17.  Follow-up Dopplers 2021 showed patency. Continue Plavix, atorvastatin Heart healthy low-sodium diet-salty 6 given Increase physical activity as tolerated  Sick sinus syndrome-status post PPM 11/08 Follows with Dr. Sallyanne Kuster  Disposition: Follow-up with Dr. Gwenlyn Found in 6 months.    Jossie Ng. Amory Simonetti NP-C    09/23/2020, 2:05 PM [address] Office [phone] Fax [phone]  Notice: This dictation was prepared  with Dragon dictation along with smaller phrase technology. Any transcriptional errors that result from this process are unintentional and may not be corrected upon review.  I spent 13 minutes examining this patient, reviewing medications, and using patient centered shared decision making involving her cardiac care.  Prior to her visit I spent greater than 20 minutes reviewing her past medical history,  medications, and prior cardiac tests.

## 2020-09-22 DIAGNOSIS — F330 Major depressive disorder, recurrent, mild: Secondary | ICD-10-CM | POA: Diagnosis not present

## 2020-09-22 DIAGNOSIS — E1151 Type 2 diabetes mellitus with diabetic peripheral angiopathy without gangrene: Secondary | ICD-10-CM | POA: Diagnosis not present

## 2020-09-22 DIAGNOSIS — E7800 Pure hypercholesterolemia, unspecified: Secondary | ICD-10-CM | POA: Diagnosis not present

## 2020-09-22 DIAGNOSIS — E1142 Type 2 diabetes mellitus with diabetic polyneuropathy: Secondary | ICD-10-CM | POA: Diagnosis not present

## 2020-09-22 DIAGNOSIS — I10 Essential (primary) hypertension: Secondary | ICD-10-CM | POA: Diagnosis not present

## 2020-09-22 DIAGNOSIS — I25709 Atherosclerosis of coronary artery bypass graft(s), unspecified, with unspecified angina pectoris: Secondary | ICD-10-CM | POA: Diagnosis not present

## 2020-09-22 DIAGNOSIS — I480 Paroxysmal atrial fibrillation: Secondary | ICD-10-CM | POA: Diagnosis not present

## 2020-09-22 DIAGNOSIS — N401 Enlarged prostate with lower urinary tract symptoms: Secondary | ICD-10-CM | POA: Diagnosis not present

## 2020-09-23 ENCOUNTER — Ambulatory Visit (INDEPENDENT_AMBULATORY_CARE_PROVIDER_SITE_OTHER): Payer: Medicare HMO | Admitting: General Practice

## 2020-09-23 ENCOUNTER — Other Ambulatory Visit: Payer: Self-pay

## 2020-09-23 ENCOUNTER — Encounter: Payer: Self-pay | Admitting: General Practice

## 2020-09-23 VITALS — BP 110/54 | HR 86 | Ht 67.0 in | Wt 168.6 lb

## 2020-09-23 DIAGNOSIS — I25810 Atherosclerosis of coronary artery bypass graft(s) without angina pectoris: Secondary | ICD-10-CM | POA: Diagnosis not present

## 2020-09-23 DIAGNOSIS — I495 Sick sinus syndrome: Secondary | ICD-10-CM | POA: Diagnosis not present

## 2020-09-23 DIAGNOSIS — E1169 Type 2 diabetes mellitus with other specified complication: Secondary | ICD-10-CM

## 2020-09-23 DIAGNOSIS — I739 Peripheral vascular disease, unspecified: Secondary | ICD-10-CM

## 2020-09-23 DIAGNOSIS — E785 Hyperlipidemia, unspecified: Secondary | ICD-10-CM | POA: Diagnosis not present

## 2020-09-23 DIAGNOSIS — I10 Essential (primary) hypertension: Secondary | ICD-10-CM | POA: Diagnosis not present

## 2020-09-23 MED ORDER — HYDRALAZINE HCL 25 MG PO TABS: 25.0000 mg | ORAL_TABLET | ORAL | Status: DC | PRN

## 2020-09-23 NOTE — Patient Instructions (Signed)
Medication Instructions:  TAKE HYDRALAZINE AS NEEDED FOR >150 AND 90 *If you need a refill on your cardiac medications before your next appointment, please call your pharmacy*  Lab Work:   Testing/Procedures:  NONE    NONE  Special Instructions TAKE AND LOG YOUR BLOOD PRESSURE 2 TIMES A MONTH  PLEASE READ AND FOLLOW SALTY 6-ATTACHED-1,800mg  daily  PLEASE MAINTAIN PHYSICAL ACTIVITY AS TOLERATED  Follow-Up: Your next appointment:  6 month(s) In Person with Quay Burow, MD   At [facility], you and your health needs are our priority.  As part of our continuing mission to provide you with exceptional heart care, we have created designated Provider Care Teams.  These Care Teams include your primary Cardiologist (physician) and Advanced Practice Providers (APPs -  Physician Assistants and Nurse Practitioners) who all work together to provide you with the care you need, when you need it.            6 SALTY THINGS TO AVOID     1,800MG  DAILY

## 2020-09-27 DIAGNOSIS — D0421 Carcinoma in situ of skin of right ear and external auricular canal: Secondary | ICD-10-CM | POA: Diagnosis not present

## 2020-09-27 DIAGNOSIS — D485 Neoplasm of uncertain behavior of skin: Secondary | ICD-10-CM | POA: Diagnosis not present

## 2020-10-04 DIAGNOSIS — S61011D Laceration without foreign body of right thumb without damage to nail, subsequent encounter: Secondary | ICD-10-CM | POA: Diagnosis not present

## 2020-10-07 DIAGNOSIS — T82858D Stenosis of vascular prosthetic devices, implants and grafts, subsequent encounter: Secondary | ICD-10-CM | POA: Diagnosis not present

## 2020-10-07 DIAGNOSIS — D692 Other nonthrombocytopenic purpura: Secondary | ICD-10-CM | POA: Diagnosis not present

## 2020-10-19 DIAGNOSIS — H5213 Myopia, bilateral: Secondary | ICD-10-CM | POA: Diagnosis not present

## 2020-10-19 DIAGNOSIS — H35372 Puckering of macula, left eye: Secondary | ICD-10-CM | POA: Diagnosis not present

## 2020-10-19 DIAGNOSIS — Z961 Presence of intraocular lens: Secondary | ICD-10-CM | POA: Diagnosis not present

## 2020-10-19 DIAGNOSIS — H34811 Central retinal vein occlusion, right eye, with macular edema: Secondary | ICD-10-CM | POA: Diagnosis not present

## 2020-10-19 DIAGNOSIS — H35353 Cystoid macular degeneration, bilateral: Secondary | ICD-10-CM | POA: Diagnosis not present

## 2020-10-19 DIAGNOSIS — H52223 Regular astigmatism, bilateral: Secondary | ICD-10-CM | POA: Diagnosis not present

## 2020-10-19 DIAGNOSIS — H524 Presbyopia: Secondary | ICD-10-CM | POA: Diagnosis not present

## 2020-10-21 ENCOUNTER — Telehealth: Payer: Self-pay | Admitting: Cardiovascular Disease

## 2020-10-21 DIAGNOSIS — C44219 Basal cell carcinoma of skin of left ear and external auricular canal: Secondary | ICD-10-CM | POA: Diagnosis not present

## 2020-10-21 NOTE — Telephone Encounter (Signed)
Pt c/o of Chest Pain: STAT if CP now or developed within 24 hours  1. Are you having CP right now? No   2. Are you experiencing any other symptoms (ex. SOB, nausea, vomiting, sweating)? no  3. How long have you been experiencing CP?  2 days   4. Is your CP continuous or coming and going?   5. Have you taken Nitroglycerin? No  ? pt has chest pains after working in his yard, pain went away after laying down.

## 2020-10-21 NOTE — Telephone Encounter (Signed)
Left message to call back  

## 2020-10-26 ENCOUNTER — Ambulatory Visit (INDEPENDENT_AMBULATORY_CARE_PROVIDER_SITE_OTHER): Payer: Medicare HMO

## 2020-10-26 DIAGNOSIS — I495 Sick sinus syndrome: Secondary | ICD-10-CM | POA: Diagnosis not present

## 2020-10-27 LAB — CUP PACEART REMOTE DEVICE CHECK
Battery Impedance: 456 Ohm
Battery Remaining Longevity: 99 mo
Battery Voltage: 2.79 V
Brady Statistic AP VP Percent: 2 %
Brady Statistic AP VS Percent: 98 %
Brady Statistic AS VP Percent: 0 %
Brady Statistic AS VS Percent: 0 %
Date Time Interrogation Session: 20220720121138
Implantable Lead Implant Date: 20070706
Implantable Lead Implant Date: 20070706
Implantable Lead Location: 753859
Implantable Lead Location: 753860
Implantable Lead Model: 5076
Implantable Lead Model: 5092
Implantable Pulse Generator Implant Date: 20170113
Lead Channel Impedance Value: 435 Ohm
Lead Channel Impedance Value: 694 Ohm
Lead Channel Pacing Threshold Amplitude: 0.75 V
Lead Channel Pacing Threshold Amplitude: 1.5 V
Lead Channel Pacing Threshold Pulse Width: 0.4 ms
Lead Channel Pacing Threshold Pulse Width: 0.4 ms
Lead Channel Setting Pacing Amplitude: 1.5 V
Lead Channel Setting Pacing Amplitude: 3 V
Lead Channel Setting Pacing Pulse Width: 0.4 ms
Lead Channel Setting Sensing Sensitivity: 5.6 mV

## 2020-10-27 NOTE — Telephone Encounter (Signed)
Left message for pt to call.

## 2020-11-03 NOTE — Telephone Encounter (Signed)
Called patient back, LVM to call back.  Left call back number.

## 2020-11-03 NOTE — Telephone Encounter (Signed)
Patient calling back. He also states earlier this week or last week he sent in a transmission and would like to know any news on it. Phone: [phone]

## 2020-11-05 DIAGNOSIS — H8110 Benign paroxysmal vertigo, unspecified ear: Secondary | ICD-10-CM | POA: Diagnosis not present

## 2020-11-05 NOTE — Telephone Encounter (Signed)
Robert Burns is returning Robert Burns's call. Please advise.

## 2020-11-05 NOTE — Telephone Encounter (Signed)
Daughter called to report pt had an episode of chest pressure last week while raking and stated symptoms were similar to prior MI. She report pt did not take a nitro but state symptoms were relieved with rest. Per daughter, pt has not had any other episodes and have been going to water aerobics. Daughter voiced she is just very concerned because how similar symptoms were to previous MI and pt was also raking during that time.    Appointment scheduled with Dr. Marisue Ivan (DOD) on 8/3 for further evaluations. Nurse also advise pt to hold off on water aerobics until appointment. Pt also made aware of ED precaution should any new symptoms develop or worsen.

## 2020-11-07 NOTE — Progress Notes (Signed)
Cardiology Office Note:   Date:  11/09/2020  NAME:  Robert Burns    MRN: [id number] DOB:  22-Jun-1940   PCP:  [name], MD  Cardiologist:  Quay Burow, MD  Electrophysiologist:  Sanda Klein, MD   Referring MD: [name], MD   Chief Complaint  Patient presents with   Chest Pain   History of Present Illness:   Robert Burns is a 80 y.o. male with a hx of CAD s/p CABG, PAD, HLD, SSS s/p ppm, pAF who presents for follow-up. Had chest pain. Recent PCI to SVGs as detailed below.  He reports for the last 3 to 4 weeks has had episodes of chest pain.  Described as tightness in his chest.  Can be sharp.  He reports it occurs with heavy activity such as raking.  Despite this he is able to go to the [facility] several days per week and do water aerobics for 45 minutes without any limitations.  He reports low energy and fatigue.  He reports he is just not felt well.  I did review the results of his most recent left heart catheterization which demonstrated 100% occluded LAD.  Patent LIMA to LAD.  Vein graft to OM was occluded.  He underwent drug-eluting stents to the vein graft RCA as well as vein graft to diagonal branch.  He has distal circumflex disease 75%.  He has a 95% ramus intermedius lesion.  Blood pressure today in office 108/60.  Antianginals include amlodipine 10 mg daily, Ranexa 500 mg twice daily, Imdur 60 mg daily, metoprolol tartrate 100 twice daily.  He reports the chest pain symptoms when he wakes go away very quickly.  He has not taken any sublingual nitroglycerin.  EKG in office demonstrates a paced rhythm.  No acute ischemic changes.  Most recent LDL cholesterol 30.  Weights are stable.  He denies any other symptoms.  He is quite concerned about his symptoms.  He reports that he likely needs a left heart catheterization.  I did inform him that it is very close to his recent stents.  I think a stress test may be more beneficial.  He does have some CKD so rushing into a left heart  catheterization might not be the best option for him.  LHC 06/22/2020 LAD 100% RI 95% dLCX 75% RCA 100% SVG-OM occluded LIMA-LAD patent  DES SVG-RCA DES SVG-Diag   Problem List CAD -CABG 2004 -LIMA-LAD, SVG-Diag, SVG-OM, SVG-RCA 2. PAD -L SFA stent 2004 -L external iliac stent 2017 -R SFA atherectomy 2017 3. SSS s/p ppm 4. OSA 5. HTN 6. HLD -T chol 72, HDL 28, LDL 30, TG 70 7. PAF  Past Medical History: Past Medical History:  Diagnosis Date   Arthritis    "all over" (11/25/2015)   CAD (coronary artery disease)    a. s/p CABG  06/2002; b. 02/01/15 PCI: DES to prox SVG to PDA, staged PCI of SVG to Diag in 02/2015; c. 04/2017 Cath/PCI: LM nl, LAD 100ost, 58m 75d, LCX 60ost, OM2 80, RCA 100ost, RPDA 80, LIMA->LAD ok, VG->D1 patent stent, VG->RPDA patent stent, 50p, VG->OM1->OM2 90p (3.0x24 Synergy DES), 100 between OM1->OM2 (med rx).   Cancer ([facility]    Right Shoulder, Left Leg- BCC, SCC, AND MELANOMA   Epithelioid hemangioendothelioma    s/p resection of left SFA/mass with interposition 6 mm GoreTex graft 06/02/10, s/p repeat resection for positive margins 09/22/10   Hyperlipidemia    Hypertension    Leg pain    OSA on  CPAP    PAD (peripheral artery disease) ([place])    a. 10/2002 L SFA PTA/BMS; b. 8/17 LE Angio: LEIA 90 (9x40 self exp stent), LSFA short segment prox occlusion (staged PTA/stenting 01/03/2016), patent mid stent, RSFA 14m(staged PTA/DEB 02/14/2016).   Presence of permanent cardiac pacemaker    Medtronic   SSS (sick sinus syndrome) ([place]    a. s/p PPM in 2007 with gen change 04/2015 - Medtronic Adapta ADDRL1, ser # [id number].   Type II diabetes mellitus (HCC)    Type II   Urgency of urination     Past Surgical History: Past Surgical History:  Procedure Laterality Date   CARDIAC CATHETERIZATION N/A 02/01/2015   Procedure: Left Heart Cath and Coronary Angiography;  Surgeon: JLorretta Harp MD;  [facility];  Service: Cardiovascular;   Laterality: N/A;   CARDIAC CATHETERIZATION N/A 02/01/2015   Procedure: Coronary Stent Intervention;  Surgeon: JLorretta Harp MD;  [facility];  Service: Cardiovascular;  Laterality: N/A;   CARDIAC CATHETERIZATION  06/2002   "just before bypass OR"   CARDIAC CATHETERIZATION N/A 03/01/2015   Procedure: Coronary Stent Intervention;  Surgeon: JLorretta Harp MD;  [facility];  Service: Cardiovascular;  Laterality: N/A;   CARDIAC CATHETERIZATION  02/06/2018   COLONOSCOPY     CORONARY ANGIOPLASTY     CORONARY ARTERY BYPASS GRAFT  06/2002   x5, LIMA-LAD;VG- Diag; seq VG- ramus & OM branch; VG-PDA   CORONARY STENT INTERVENTION N/A 04/12/2017   Procedure: CORONARY STENT INTERVENTION;  Surgeon: BLorretta Harp MD;  [facility];  Service: Cardiovascular;  Laterality: N/A;   CORONARY STENT INTERVENTION N/A 02/08/2018   Procedure: CORONARY STENT INTERVENTION;  Surgeon: VJettie Booze MD;  [facility];  Service: Cardiovascular;  Laterality: N/A;   CORONARY STENT INTERVENTION N/A 11/12/2018   Procedure: CORONARY STENT INTERVENTION;  Surgeon: AWellington Hampshire MD;  [facility];  Service: Cardiovascular;  Laterality: N/A;   CORONARY STENT INTERVENTION N/A 06/22/2020   Procedure: CORONARY STENT INTERVENTION;  Surgeon: VJettie Booze MD;  [facility];  Service: Cardiovascular;  Laterality: N/A;   EP IMPLANTABLE DEVICE N/A 04/23/2015   Procedure: PPM Generator Changeout;  Surgeon: MSanda Klein MD;  [facility];  Service: Cardiovascular;  Laterality: N/A;   FALSE ANEURYSM REPAIR Left 11/29/2018   Procedure: REPAIR FALSE ANEURYSM LEFT RADIAL ARTERY;  Surgeon: ERosetta Posner MD;  [place]  Service: Vascular;  Laterality: Left;   FEMORAL ARTERY STENT Left ~ 2014   "taken out of my leg; couldn' catorgorize what kind so the put it under all 3"; cataroziepitheloid  hemanioendotheliomau   FOOT FRACTURE SURGERY Left [place]/ REPLACE / REMOVE PACEMAKER  10/13/05   right side, medtronic Adapta   KNEE HARDWARE REMOVAL Right 1950's   "3-4 months after the insertion"   KNEE SURGERY Right 1950's   "broke my lower leg; had to put pin in my knee to keep lower leg in place til it healed"   LEFT HEART CATH AND CORS/GRAFTS ANGIOGRAPHY N/A 04/12/2017   Procedure: LEFT HEART CATH AND CORS/GRAFTS ANGIOGRAPHY;  Surgeon: BLorretta Harp MD;  [facility];  Service: Cardiovascular;  Laterality: N/A;   LEFT HEART CATH AND CORS/GRAFTS ANGIOGRAPHY N/A 02/06/2018   Procedure: LEFT HEART CATH AND CORS/GRAFTS ANGIOGRAPHY;  Surgeon: KShelva Majestic  A, MD;  Location: [place] [facility];  Service: Cardiovascular;  Laterality: N/A;   LEFT HEART CATH AND CORS/GRAFTS ANGIOGRAPHY N/A 11/12/2018   Procedure: LEFT HEART CATH AND CORS/GRAFTS ANGIOGRAPHY;  Surgeon: Wellington Hampshire, MD;  Location: [place] [facility];  Service: Cardiovascular;  Laterality: N/A;   LEFT HEART CATH AND CORS/GRAFTS ANGIOGRAPHY N/A 06/22/2020   Procedure: LEFT HEART CATH AND CORS/GRAFTS ANGIOGRAPHY;  Surgeon: Jettie Booze, MD;  Location: [place] [facility];  Service: Cardiovascular;  Laterality: N/A;   PERIPHERAL VASCULAR CATHETERIZATION N/A 11/25/2015   Procedure: Lower Extremity Angiography;  Surgeon: Lorretta Harp, MD;  Location: [place] [facility];  Service: Cardiovascular;  Laterality: N/A;   PERIPHERAL VASCULAR CATHETERIZATION Left 11/25/2015   Procedure: Peripheral Vascular Intervention;  Surgeon: Lorretta Harp, MD;  Location: [place] [facility];  Service: Cardiovascular;  Laterality: Left;  external iliac   PERIPHERAL VASCULAR CATHETERIZATION N/A 01/03/2016   Procedure: Lower Extremity Angiography;  Surgeon: Lorretta Harp, MD;  Location: [place] [facility];  Service: Cardiovascular;  Laterality: N/A;   PERIPHERAL VASCULAR CATHETERIZATION Left 01/03/2016    Procedure: Peripheral Vascular Intervention;  Surgeon: Lorretta Harp, MD;  Location: [place] [facility];  Service: Cardiovascular;  Laterality: Left;  SFA   PERIPHERAL VASCULAR CATHETERIZATION Right 02/14/2016   Procedure: Peripheral Vascular Atherectomy;  Surgeon: Lorretta Harp, MD;  Location: [place] [facility];  Service: Cardiovascular;  Laterality: Right;  SFA   POPLITEAL ARTERY STENT  01/03/2016   Contralateral access with a 7 [place] crossover sheath (second order catheter placement)   TONSILLECTOMY AND ADENOIDECTOMY     TUMOR EXCISION Right ~ 2005   cancerous tumor removed from shoulder   TUMOR EXCISION Right ~ 2000   benign tumor removed from under shoulder   TUMOR EXCISION Left 06/02/2010   resection of Lt SFA wth interposition of Gore-Tex graft    Current Medications: Current Meds  Medication Sig   amLODipine (NORVASC) 10 MG tablet Take 10 mg by mouth daily.   atorvastatin (LIPITOR) 80 MG tablet Take 1 tablet (80 mg total) by mouth daily at 6 PM.   Cholecalciferol (VITAMIN D3) 2000 units capsule Take 2,000 Units by mouth 2 (two) times daily.   clopidogrel (PLAVIX) 75 MG tablet Take 1 tablet (75 mg total) by mouth daily with breakfast.   dorzolamide-timolol (COSOPT) 22.3-6.8 MG/ML ophthalmic solution Place 1 drop into both eyes 2 (two) times daily.   glipiZIDE (GLUCOTROL) 5 MG tablet Take 5 mg by mouth daily with breakfast.    hydrALAZINE (APRESOLINE) 25 MG tablet Take 1 tablet (25 mg total) by mouth as needed (for BP >150 sys or 90 dia).   hydrochlorothiazide (HYDRODIURIL) 25 MG tablet Take 25 mg by mouth every morning.   isosorbide mononitrate (IMDUR) 60 MG 24 hr tablet TAKE 1 TABLET EVERY DAY (Patient taking differently: Take 60 mg by mouth daily.)   ketorolac (ACULAR) 0.4 % SOLN Place 1 drop into both eyes 4 (four) times daily.   melatonin 5 MG TABS Take 5 mg by mouth at bedtime.   metFORMIN (GLUCOPHAGE) 1000 MG tablet Take 1 tablet (1,000 mg total) by mouth 2 (two)  times daily with a meal. Restart on 3/18.   metoprolol tartrate (LOPRESSOR) 100 MG tablet Take 1 tablet (100 mg total) by mouth 2 (two) times daily.   MULTIPLE VITAMIN-FOLIC ACID PO Take 1 tablet by mouth daily.   neomycin-polymyxin-hydrocortisone (CORTISPORIN) 3.5-10000-1 OTIC suspension Place 3 drops into both ears 3 (  three) times daily.   nitroGLYCERIN (NITROSTAT) 0.4 MG SL tablet Place 1 tablet (0.4 mg total) under the tongue every 5 (five) minutes as needed for chest pain.   pantoprazole (PROTONIX) 20 MG tablet Take 20 mg by mouth daily.   potassium chloride SA (K-DUR,KLOR-CON) 20 MEQ tablet Take 1 tablet (20 mEq total) by mouth daily.   rivaroxaban (XARELTO) 20 MG TABS tablet Take 1 tablet (20 mg total) by mouth at bedtime.   rOPINIRole (REQUIP) 1 MG tablet Take 1 mg by mouth at bedtime.   tamsulosin (FLOMAX) 0.4 MG CAPS capsule Take 0.4 mg by mouth 2 (two) times daily.    [DISCONTINUED] clopidogrel (PLAVIX) 75 MG tablet TAKE 1 TABLET (75 MG TOTAL) BY MOUTH DAILY WITH BREAKFAST.   [DISCONTINUED] ranolazine (RANEXA) 500 MG 12 hr tablet Take 1 tablet (500 mg total) by mouth 2 (two) times daily.     Allergies:    Peanut-containing drug products, Ace inhibitors, Diltiazem hcl, Meloxicam, Eliquis [apixaban], Sertraline hcl, Carvedilol, Clonidine hcl, and Duloxetine hcl   Social History: Social History   Socioeconomic History   Marital status: Married    Spouse name: Not on file   Number of children: Not on file   Years of education: Not on file   Highest education level: Not on file  Occupational History   Not on file  Tobacco Use   Smoking status: Former    Types: Pipe    Quit date: 7    Years since quitting: 49.6   Smokeless tobacco: Never   Tobacco comments:    "quit smoking in 1973"  Vaping Use   Vaping Use: Never used  Substance and Sexual Activity   Alcohol use: No   Drug use: No   Sexual activity: Not Currently  Other Topics Concern   Not on file  Social History  Narrative   Not on file   Social Determinants of Health   Financial Resource Strain: Not on file  Food Insecurity: Not on file  Transportation Needs: Not on file  Physical Activity: Not on file  Stress: Not on file  Social Connections: Not on file     Family History: The patient's family history includes Coronary artery disease in his mother; Diabetes in his father and sister; Heart attack in his father and mother; Heart disease in his father, mother, and sister; Hyperlipidemia in his father; Hypertension in his mother and sister.  ROS:   All other ROS reviewed and negative. Pertinent positives noted in the HPI.     EKGs/Labs/Other Studies Reviewed:   The following studies were personally reviewed by me today:  EKG:  EKG is ordered today.  The ekg ordered today demonstrates atrial paced rhythm, anteroseptal infarct, and was personally reviewed by me.   TTE 06/22/2020  1. Left ventricular ejection fraction, by estimation, is 55 to 60%. The  left ventricle has normal function. The left ventricle demonstrates  regional wall motion abnormalities (see scoring diagram/findings for  description). There is mild concentric left  ventricular hypertrophy. Left ventricular diastolic parameters are  consistent with Grade II diastolic dysfunction (pseudonormalization).  Elevated left atrial pressure. There is mild hypokinesis of the left  ventricular, basal inferior wall.   2. Right ventricular systolic function is normal. The right ventricular  size is normal. There is normal pulmonary artery systolic pressure. The  estimated right ventricular systolic pressure is 0000000 mmHg.   3. Left atrial size was moderately dilated.   4. Right atrial size was mildly dilated.  5. The mitral valve is normal in structure. No evidence of mitral valve  regurgitation. No evidence of mitral stenosis.   6. The aortic valve is normal in structure. Aortic valve regurgitation is  not visualized. No aortic  stenosis is present.   7. The inferior vena cava is normal in size with greater than 50%  respiratory variability, suggesting right atrial pressure of 3 mmHg.   Recent Labs: 06/24/2020: BUN 21; Creatinine, Ser 1.08; Potassium 4.0; Sodium 138 08/04/2020: Hemoglobin 12.0; Platelets 185   Recent Lipid Panel    Component Value Date/Time   CHOL 72 06/23/2020 0325   TRIG 70 06/23/2020 0325   HDL 28 (L) 06/23/2020 0325   CHOLHDL 2.6 06/23/2020 0325   VLDL 14 06/23/2020 0325   LDLCALC 30 06/23/2020 0325    Physical Exam:   VS:  BP 108/60   Pulse 69   Resp 18   Ht '5\' 7"'$  (1.702 m)   Wt 173 lb (78.5 kg)   SpO2 98%   BMI 27.10 kg/m    Wt Readings from Last 3 Encounters:  11/09/20 173 lb (78.5 kg)  09/23/20 168 lb 9.6 oz (76.5 kg)  07/02/20 176 lb 3.2 oz (79.9 kg)    General: Well nourished, well developed, in no acute distress Head: Atraumatic, normal size  Eyes: PEERLA, EOMI  Neck: Supple, no JVD Endocrine: No thryomegaly Cardiac: Normal S1, S2; RRR; no murmurs, rubs, or gallops Lungs: Clear to auscultation bilaterally, no wheezing, rhonchi or rales  Abd: Soft, nontender, no hepatomegaly  Ext: No edema, pulses 2+ Musculoskeletal: No deformities, BUE and BLE strength normal and equal Skin: Warm and dry, no rashes   Neuro: Alert and oriented to person, place, time, and situation, CNII-XII grossly intact, no focal deficits  Psych: Normal mood and affect   ASSESSMENT:   BERYLE LOISEAU is a 80 y.o. male who presents for the following: 1. Coronary artery disease of bypass graft of native heart with stable angina pectoris (HCC)   2. Stable angina pectoris ([place])     PLAN:   1. Coronary artery disease of bypass graft of native heart with stable angina pectoris ([place]) 2. Stable angina pectoris ([place]) -History of CABG in 2004.  Recent heart catheterization with patent LIMA to LAD.  Occluded vein graft to OM.  He underwent PCI to the vein graft to diagonal lesion as well as vein graft  to the RCA. -He has residual disease in the distal left circumflex as well as ramus intermedius.  These are not amendable to PCI. -He reports chest tightness as well as sharp pain in his chest with heavy exertion such as raking.  He is not affected by water aerobics up to 45 minutes.  His EKG is without acute ischemic changes. -No evidence of congestive heart failure today's examination.  His examination is benign. -He has no room to move on blood pressure medications.  BP 108/60.  He is on amlodipine 10 mg daily, Ranexa 500 mg twice daily, Imdur 60 mg daily, metoprolol tartrate 100 mg twice daily. -The only thing we can move up on antianginals is Ranexa.  We will increase this to 1000 mg twice daily. -Given recent left heart catheterization I would start with a nuclear medicine stress test.  This will look for ischemia in the region of his recent vein graft.  If symptoms persist or if he has large area of ischemia he should be considered for left heart catheterization at discretion of Dr. Quay Burow who is  his primary cardiologist.  He reports he has not felt well since his heart catheterization in March.  His symptoms may be unrelated to obstructive CAD.  I will defer further management to Quay Burow.  Shared Decision Making/Informed Consent The risks [chest pain, shortness of breath, cardiac arrhythmias, dizziness, blood pressure fluctuations, myocardial infarction, stroke/transient ischemic attack, nausea, vomiting, allergic reaction, radiation exposure, metallic taste sensation and life-threatening complications (estimated to be 1 in 10,000)], benefits (risk stratification, diagnosing coronary artery disease, treatment guidance) and alternatives of a nuclear stress test were discussed in detail with Mr. Stare and he agrees to proceed.  Disposition: Return if symptoms worsen or fail to improve.  Medication Adjustments/Labs and Tests Ordered: Current medicines are reviewed at length with the  patient today.  Concerns regarding medicines are outlined above.  Orders Placed This Encounter  Procedures   Cardiac Stress Test: Informed Consent Details: Physician/Practitioner Attestation; Transcribe to consent form and obtain patient signature   MYOCARDIAL PERFUSION IMAGING   EKG 12-Lead    Meds ordered this encounter  Medications   ranolazine (RANEXA) 1000 MG SR tablet    Sig: Take 1 tablet (1,000 mg total) by mouth 2 (two) times daily.    Dispense:  180 tablet    Refill:  3     Patient Instructions  Medications:  INCREASE RANOLAZINE TO 1000 MG TWICE DAILY= 2 OF THE 500 MG TABLETS TWICE DAILY  Testing/Procedures:  Your physician has requested that you have a lexiscan myoview. For further information please visit [email or web address]. Please follow instruction sheet, as given. SCHEDULE ASAP   Follow-Up: At [facility], you and your health needs are our priority.  As part of our continuing mission to provide you with exceptional heart care, we have created designated Provider Care Teams.  These Care Teams include your primary Cardiologist (physician) and Advanced Practice Providers (APPs -  Physician Assistants and Nurse Practitioners) who all work together to provide you with the care you need, when you need it.  We recommend signing up for the patient portal called "MyChart".  Sign up information is provided on this After Visit Summary.  MyChart is used to connect with patients for Virtual Visits (Telemedicine).  Patients are able to view lab/test results, encounter notes, upcoming appointments, etc.  Non-urgent messages can be sent to your provider as well.   To learn more about what you can do with MyChart, go to [email or web address].    Your next appointment:   2 week(s)  The format for your next appointment:   In Person  Provider:   Quay Burow, MD    Time Spent with Patient: I have spent a total of 35 minutes with patient reviewing hospital notes,  telemetry, EKGs, labs and examining the patient as well as establishing an assessment and plan that was discussed with the patient.  > 50% of time was spent in direct patient care.  Signed, Addison Naegeli. Audie Box, MD, [place]  [address], [place], [place] [address] [phone]  11/09/2020 4:58 PM

## 2020-11-09 ENCOUNTER — Ambulatory Visit: Payer: Medicare HMO | Admitting: Cardiovascular Disease

## 2020-11-09 ENCOUNTER — Other Ambulatory Visit: Payer: Self-pay

## 2020-11-09 ENCOUNTER — Encounter: Payer: Self-pay | Admitting: Cardiovascular Disease

## 2020-11-09 VITALS — BP 108/60 | HR 69 | Resp 18 | Ht 67.0 in | Wt 173.0 lb

## 2020-11-09 DIAGNOSIS — I208 Other forms of angina pectoris: Secondary | ICD-10-CM | POA: Diagnosis not present

## 2020-11-09 DIAGNOSIS — H8110 Benign paroxysmal vertigo, unspecified ear: Secondary | ICD-10-CM | POA: Diagnosis not present

## 2020-11-09 DIAGNOSIS — I25708 Atherosclerosis of coronary artery bypass graft(s), unspecified, with other forms of angina pectoris: Secondary | ICD-10-CM

## 2020-11-09 DIAGNOSIS — R490 Dysphonia: Secondary | ICD-10-CM | POA: Diagnosis not present

## 2020-11-09 MED ORDER — RANOLAZINE ER 1000 MG PO TB12: 1000.0000 mg | ORAL_TABLET | Freq: Two times a day (BID) | ORAL | 3 refills | Status: DC

## 2020-11-09 NOTE — Patient Instructions (Addendum)
Medications:  INCREASE RANOLAZINE TO 1000 MG TWICE DAILY= 2 OF THE 500 MG TABLETS TWICE DAILY  Testing/Procedures:  Your physician has requested that you have a lexiscan myoview. For further information please visit [email or web address]. Please follow instruction sheet, as given. SCHEDULE ASAP   Follow-Up: At [facility], you and your health needs are our priority.  As part of our continuing mission to provide you with exceptional heart care, we have created designated Provider Care Teams.  These Care Teams include your primary Cardiologist (physician) and Advanced Practice Providers (APPs -  Physician Assistants and Nurse Practitioners) who all work together to provide you with the care you need, when you need it.  We recommend signing up for the patient portal called "MyChart".  Sign up information is provided on this After Visit Summary.  MyChart is used to connect with patients for Virtual Visits (Telemedicine).  Patients are able to view lab/test results, encounter notes, upcoming appointments, etc.  Non-urgent messages can be sent to your provider as well.   To learn more about what you can do with MyChart, go to [email or web address].    Your next appointment:   2 week(s)  The format for your next appointment:   In Person  Provider:   Quay Burow, MD

## 2020-11-10 ENCOUNTER — Telehealth: Payer: Self-pay | Admitting: Cardiovascular Disease

## 2020-11-10 NOTE — Telephone Encounter (Signed)
Nuclear department needs patient appointment on 8/5 at 9:45 to be rescheduled. This slot was originally supposed to be on hold. Patient can come in 8/5 at 1 pm if slot is still open or reschedule for another day & time. Voicemail was left for patient to call back to reschedule appointment.

## 2020-11-11 ENCOUNTER — Telehealth (HOSPITAL_COMMUNITY): Payer: Self-pay | Admitting: *Deleted

## 2020-11-11 NOTE — Telephone Encounter (Signed)
Close encounter 

## 2020-11-12 ENCOUNTER — Ambulatory Visit (HOSPITAL_COMMUNITY)
Admission: RE | Admit: 2020-11-12 | Discharge: 2020-11-12 | Disposition: A | Discharge status: Home / Self Care | Payer: Medicare HMO | Source: Ambulatory Visit | Attending: Cardiovascular Disease | Admitting: Cardiovascular Disease

## 2020-11-12 ENCOUNTER — Other Ambulatory Visit: Payer: Self-pay

## 2020-11-12 DIAGNOSIS — I25708 Atherosclerosis of coronary artery bypass graft(s), unspecified, with other forms of angina pectoris: Secondary | ICD-10-CM | POA: Diagnosis not present

## 2020-11-12 DIAGNOSIS — I208 Other forms of angina pectoris: Secondary | ICD-10-CM | POA: Diagnosis not present

## 2020-11-12 LAB — MYOCARDIAL PERFUSION IMAGING
LV dias vol: 107 mL (ref 62–150)
LV sys vol: 67 mL
Peak HR: 69 {beats}/min
Rest HR: 66 {beats}/min
SDS: 1
SRS: 1
SSS: 2
TID: 0.98

## 2020-11-12 MED ORDER — REGADENOSON 0.4 MG/5ML IV SOLN
0.4000 mg | Freq: Once | INTRAVENOUS | Status: AC
  Administered 2020-11-12: 0.4 mg via INTRAVENOUS

## 2020-11-12 MED ORDER — TECHNETIUM TC 99M TETROFOSMIN IV KIT
30.6000 | PACK | Freq: Once | INTRAVENOUS | Status: AC | PRN
  Administered 2020-11-12: 30.6 via INTRAVENOUS
  Filled 2020-11-12: qty 31

## 2020-11-12 MED ORDER — TECHNETIUM TC 99M TETROFOSMIN IV KIT
10.2000 | PACK | Freq: Once | INTRAVENOUS | Status: AC | PRN
  Administered 2020-11-12: 10.2 via INTRAVENOUS
  Filled 2020-11-12: qty 11

## 2020-11-12 MED ORDER — AMINOPHYLLINE 25 MG/ML IV SOLN
75.0000 mg | Freq: Once | INTRAVENOUS | Status: AC
  Administered 2020-11-12: 75 mg via INTRAVENOUS

## 2020-11-17 ENCOUNTER — Encounter: Payer: Self-pay | Admitting: *Deleted

## 2020-11-18 NOTE — Progress Notes (Signed)
Remote pacemaker transmission.   

## 2020-11-19 ENCOUNTER — Ambulatory Visit: Payer: Medicare HMO | Admitting: Cardiovascular Disease

## 2020-11-19 ENCOUNTER — Other Ambulatory Visit: Payer: Self-pay

## 2020-11-19 ENCOUNTER — Encounter: Payer: Self-pay | Admitting: Cardiovascular Disease

## 2020-11-19 DIAGNOSIS — I10 Essential (primary) hypertension: Secondary | ICD-10-CM

## 2020-11-19 DIAGNOSIS — E785 Hyperlipidemia, unspecified: Secondary | ICD-10-CM

## 2020-11-19 DIAGNOSIS — E1169 Type 2 diabetes mellitus with other specified complication: Secondary | ICD-10-CM

## 2020-11-19 DIAGNOSIS — I25810 Atherosclerosis of coronary artery bypass graft(s) without angina pectoris: Secondary | ICD-10-CM

## 2020-11-19 DIAGNOSIS — G4733 Obstructive sleep apnea (adult) (pediatric): Secondary | ICD-10-CM | POA: Diagnosis not present

## 2020-11-19 DIAGNOSIS — I495 Sick sinus syndrome: Secondary | ICD-10-CM | POA: Diagnosis not present

## 2020-11-19 DIAGNOSIS — I739 Peripheral vascular disease, unspecified: Secondary | ICD-10-CM | POA: Diagnosis not present

## 2020-11-19 DIAGNOSIS — Z9989 Dependence on other enabling machines and devices: Secondary | ICD-10-CM | POA: Diagnosis not present

## 2020-11-19 NOTE — Assessment & Plan Note (Signed)
History of obstructive sleep apnea on CPAP. 

## 2020-11-19 NOTE — Progress Notes (Signed)
11/19/2020 Robert Burns   1941/02/20  [id number]  Primary Physician [name], MD Primary Cardiologist: Lorretta Harp MD Robert Burns, [name]  HPI:  Robert Burns is a 80 y.o.  married Caucasian male, father of 2, grandfather to 3 grandchildren whose wife [name] who is also a patient of mine. I last saw him in the office  01/09/2020.Robert Burns  He is adopted by [employer], one of his close family friends.  He is accompanied by one of his daughters Robert Burns today.  He has a history of CAD status post coronary artery bypass grafting March 2004 with a LIMA to his LAD, a vein to a diagonal branch, a sequential vein to a ramus and OM branch, as well as a vein to the PDA. Last functional study performed July 28, 2011, was entirely normal. 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 He had a pacemaker placed for sick sinus syndrome November 2008 followed by Dr. Sallyanne Kuster. He has obstructive sleep apnea on CPAP. He complain of left thigh pain and I angiogram'd him revealing patent arteries though he did have a space-occupying lesion which was removed by Dr. Kellie Simmering with placement of an interposition 6-mm Gore-Tex graft. The pathology was uncertain. 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 Dr. Baxter Flattery follows his lipid profile.Since I saw him back 11/07/12 he has done well.  Followup Dopplers performed in our office 09/27/12 revealed a high-grade lesion in the distal right SFA with an ABI of 0.3. His left ABI 1.1 without obstructive disease.  His most recent lower extremity Doppler Dopplers performed 9/32/16 revealed a right ABI 0.82  And a left ABI of 0.89. Over the last 3 months he's noticed anginal chest pain occurring both at rest and with exertion with left upper extremity radiation. He also complains of left lower extremity discomfort. to moderate anterolateral ischemia. Because of ongoing chest pain and a Myoview that showed anterolateral ischemia and he underwent cardiac  catheterization on 02/01/15 revealing high grade segmental proximal right SVG disease and subtotally occluded diagonal branch SVG. He underwent stenting of his RCA SVG successfully. He does have continued chest pain although somewhat improved since his last procedure.  I brought him back for staged diagonal branch SVG intervention on 03/01/15. This was successful and since that time he denies chest pain or shortness of breath. He underwent a generator change by Dr. Sallyanne Kuster in January for end-of-life of his Medtronic pacemaker. Since I saw him 6 months ago he developed new left calf claudication of the last 3 months. Lower extremity arterial Doppler studies performed today revealed a decline in his left ABI from 0.87 down to 0.59 with an occluded left SFA that is a new finding. 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 He had a short segment occlusion of the proximal left SFA with patent mid left SFA stent. He had 90% segmental calcified mid right SFA stenosis with three-vessel runoff bilaterally. I ended up stenting his left external iliac artery with a 9 mm x 40 mm long nitinol self-expanding  stent which improved his symptoms somewhat although he continued to have lifestyle limiting claudication. He underwent staged intervention of his left SFA on 01/03/16. I stented the entire quadrant totally occluded segment with a 6 mm x 250 mm long Viabahn  Stent. His claudication on the left  has resolved  and his ABIs normalized. He now complains of right leg medication. I did demonstrate a 90% calcified segmental mid right SFA stenosis at the time of angiography 11/25/15. He underwent diamondback orbital rotation atherectomy/drug-eluting angioplasty of his mid calcified right SFA by myself 02/14/16. This follow-up Dopplers performed 02/24/16 revealed normal ABIs bilaterally.  His claudication has improved. When I saw him in the office possibly 3 weeks ago he was complaining of fairly new onset substernal chest pain over the prior several months occurring several times a week. These were new since his RCA and diagonal branch vein graft interventions at the end of 2016. He is on good antianginal medications. A Myoview stress test was ordered that showed subtle inferolateral ischemia and echo showed normal LV function.Robert Burns   He was admitted to the hospital 02/06/2018 for 3 days with unstable angina.  He underwent catheterization by Dr. Claiborne Billings on 02/06/2018 revealing disease in all 3 vein grafts as well as in the LAD beyond LIMA insertion, 2 days later he underwent complex intervention on his diagonal and ramus branch vein grafts with restenting as well as intervention on his LAD via LIMA insertion.  His RCA vein graft was not intervened on.  He currently denies angina or claudication.    He was admitted to the hospital with unstable angina on 11/11/2018.  Enzymes were negative.  He underwent catheterization and intervention by Dr. Fletcher Anon 11/12/2018 via the left radial approach with stenting of his proximal diagonal branch vein graft in his distal PDA vein graft.  Since discharge, his anginal symptoms have significantly improved although he still has a "heaviness feeling" in his chest.  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  This was subsequently surgically addressed by Dr. Donnetta Hutching.   Dr. Sallyanne Kuster did identify significant A. fib burden on remote interrogation of his pacemaker and he was subsequent begun on Eliquis which was transitioned to Xarelto.  He gets occasional chest pain not requiring sublingual nitroglycerin.  His most recent lower extremity arterial Doppler studies revealed patent left iliac and SFAs bilaterally.  Carotid Dopplers performed last December showed moderate right ICA stenosis.   He was admitted to the hospital with unstable angina 06/21/2020  and underwent diagnostic coronary angiography by Dr. Irish Lack via the femoral approach with intervention of his ramus branch vein graft and PDA vein graft with excellent result.  Since discharge on 06/24/2020 he feels clinically improved but was feeling an episode of chest pain while raking leaves although no discomfort while doing water aerobics for 45 minutes.  He was seen by Dr. Farris Has 11/09/2020 relating some of the symptoms of chest pain.  A Myoview stress test was ordered on 11/12/2020 which was entirely normal.  At this point, he is on maximum antianginal medications without room to add or increase and given the paucity of symptoms I do not feel compelled to pursue a more invasive approach at this time.   Current Meds  Medication Sig   amLODipine (NORVASC) 10 MG tablet Take 10 mg by mouth daily.   atorvastatin (LIPITOR) 80 MG tablet Take 1 tablet (80 mg total) by mouth daily at 6 PM.   Cholecalciferol (VITAMIN D3) 2000 units capsule Take 2,000 Units by mouth 2 (two) times daily.   clopidogrel (PLAVIX) 75 MG tablet Take 1 tablet (75 mg total) by mouth daily with breakfast.   dorzolamide-timolol (COSOPT) 22.3-6.8 MG/ML ophthalmic solution Place 1 drop into both eyes 2 (two) times daily.   glipiZIDE (GLUCOTROL)  5 MG tablet Take 5 mg by mouth daily with breakfast.    hydrALAZINE (APRESOLINE) 25 MG tablet Take 1 tablet (25 mg total) by mouth as needed (for BP >150 sys or 90 dia).   hydrochlorothiazide (HYDRODIURIL) 25 MG tablet Take 25 mg by mouth every morning.   isosorbide mononitrate (IMDUR) 60 MG 24 hr tablet TAKE 1 TABLET EVERY DAY (Patient taking differently: Take 60 mg by mouth daily.)   ketorolac (ACULAR) 0.4 % SOLN Place 1 drop into both eyes 4 (four) times daily.   melatonin 5 MG TABS Take 5 mg by mouth at bedtime.   metFORMIN (GLUCOPHAGE) 1000 MG tablet Take 1 tablet (1,000 mg total) by mouth 2 (two) times daily with a meal. Restart on 3/18.   metoprolol tartrate (LOPRESSOR) 100 MG tablet  Take 1 tablet (100 mg total) by mouth 2 (two) times daily.   MODERNA COVID-19 VACCINE 100 MCG/0.5ML injection    MULTIPLE VITAMIN-FOLIC ACID PO Take 1 tablet by mouth daily.   neomycin-polymyxin-hydrocortisone (CORTISPORIN) 3.5-10000-1 OTIC suspension Place 3 drops into both ears 3 (three) times daily.   nitroGLYCERIN (NITROSTAT) 0.4 MG SL tablet Place 1 tablet (0.4 mg total) under the tongue every 5 (five) minutes as needed for chest pain.   pantoprazole (PROTONIX) 20 MG tablet Take 20 mg by mouth daily.   potassium chloride SA (K-DUR,KLOR-CON) 20 MEQ tablet Take 1 tablet (20 mEq total) by mouth daily.   ranolazine (RANEXA) 1000 MG SR tablet Take 1 tablet (1,000 mg total) by mouth 2 (two) times daily.   rivaroxaban (XARELTO) 20 MG TABS tablet Take 1 tablet (20 mg total) by mouth at bedtime.   rOPINIRole (REQUIP) 1 MG tablet Take 1 mg by mouth at bedtime.   tamsulosin (FLOMAX) 0.4 MG CAPS capsule Take 0.4 mg by mouth 2 (two) times daily.      Allergies  Allergen Reactions   Peanut-Containing Drug Products Anaphylaxis and Other (See Comments)    Tongue swelling is severe    Ace Inhibitors Hives, Rash and Cough        Diltiazem Hcl Other (See Comments)    UNSPECIFIED REACTION     Meloxicam Other (See Comments)    GI upset GI BLEED    Eliquis [Apixaban] Other (See Comments)    dizzyness   Sertraline Hcl Other (See Comments)   Carvedilol Itching   Clonidine Hcl Other (See Comments)    Patch only - skin irritation   Duloxetine Hcl Other (See Comments)    Urinary frequency    Social History   Socioeconomic History   Marital status: Married    Spouse name: Not on file   Number of children: Not on file   Years of education: Not on file   Highest education level: Not on file  Occupational History   Not on file  Tobacco Use   Smoking status: Former    Types: Pipe    Quit date: 76    Years since quitting: 49.6   Smokeless tobacco: Never   Tobacco comments:    "quit  smoking in 1973"  Vaping Use   Vaping Use: Never used  Substance and Sexual Activity   Alcohol use: No   Drug use: No   Sexual activity: Not Currently  Other Topics Concern   Not on file  Social History Narrative   Not on file   Social Determinants of Health   Financial Resource Strain: Not on file  Food Insecurity: Not on file  Transportation Needs: Not  on file  Physical Activity: Not on file  Stress: Not on file  Social Connections: Not on file  Intimate Partner Violence: Not on file     Review of Systems: General: negative for chills, fever, night sweats or weight changes.  Cardiovascular: negative for chest pain, dyspnea on exertion, edema, orthopnea, palpitations, paroxysmal nocturnal dyspnea or shortness of breath Dermatological: negative for rash Respiratory: negative for cough or wheezing Urologic: negative for hematuria Abdominal: negative for nausea, vomiting, diarrhea, bright red blood per rectum, melena, or hematemesis Neurologic: negative for visual changes, syncope, or dizziness All other systems reviewed and are otherwise negative except as noted above.    Blood pressure 129/73, pulse 92, height '5\' 7"'$  (1.702 m), weight 168 lb 12.8 oz (76.6 kg), SpO2 98 %.  General appearance: alert and no distress Neck: no adenopathy, no carotid bruit, no JVD, supple, symmetrical, trachea midline, and thyroid not enlarged, symmetric, no tenderness/mass/nodules Lungs: clear to auscultation bilaterally Heart: regular rate and rhythm, S1, S2 normal, no murmur, click, rub or gallop Extremities: extremities normal, atraumatic, no cyanosis or edema Pulses: 2+ and symmetric Skin: Skin color, texture, turgor normal. No rashes or lesions Neurologic: Grossly normal  EKG not performed today  ASSESSMENT AND PLAN:   Essential hypertension History of essential hypertension with blood pressure measured today in the office of 129/73.  He is on amlodipine, hydralazine,  hydrochlorothiazide, metoprolol.  SSS (sick sinus syndrome), medtronic adapta History of sick sinus syndrome status post Medtronic Adapta pacemaker placed 10/13/2005 followed by Dr. Sallyanne Kuster.  Hyperlipidemia due to type 2 diabetes mellitus ([place]) History of hyperlipidemia on high-dose atorvastatin with lipid profile performed 06/23/2020 revealing total cholesterol 72, LDL 30 and HDL 28.  OSA on CPAP History of obstructive sleep apnea on CPAP  Peripheral arterial disease ([place]) History of PAD status post multiple interventions on his lower extremities well outlined in previous notes which we followed by duplex ultrasound.  CAD (coronary artery disease) of bypass graft History of CAD status post coronary artery bypass grafting March 2004 with a LIMA to the LAD, vein to diagonal branch, sequential vein to ramus and OM branch and a vein to the PDA.  He said multiple interventions on his grafts over the years most recently by Dr. Irish Lack 06/21/2020.  He intervened on the ramus branch vein graft and the PDA vein graft with excellent results.  A recent Myoview stress test ordered by Dr. Audie Box 11/12/2020 was entirely normal.  The patient can do water aerobics without chest pressure but but did get some chest pain while raking leaves.  He is on maximum antianginal medications.  At this point, there is no indication for repeat cardiac catheterization.     Lorretta Harp MD FACP,FACC,FAHA, [facility] 11/19/2020 12:51 PM

## 2020-11-19 NOTE — Assessment & Plan Note (Signed)
History of essential hypertension with blood pressure measured today in the office of 129/73.  He is on amlodipine, hydralazine, hydrochlorothiazide, metoprolol.

## 2020-11-19 NOTE — Patient Instructions (Signed)

## 2020-11-19 NOTE — Assessment & Plan Note (Signed)
History of sick sinus syndrome status post Medtronic Adapta pacemaker placed 10/13/2005 followed by Dr. Sallyanne Kuster.

## 2020-11-19 NOTE — Assessment & Plan Note (Signed)
History of CAD status post coronary artery bypass grafting March 2004 with a LIMA to the LAD, vein to diagonal branch, sequential vein to ramus and OM branch and a vein to the PDA.  He said multiple interventions on his grafts over the years most recently by Dr. Irish Lack 06/21/2020.  He intervened on the ramus branch vein graft and the PDA vein graft with excellent results.  A recent Myoview stress test ordered by Dr. Audie Box 11/12/2020 was entirely normal.  The patient can do water aerobics without chest pressure but but did get some chest pain while raking leaves.  He is on maximum antianginal medications.  At this point, there is no indication for repeat cardiac catheterization.

## 2020-11-19 NOTE — Assessment & Plan Note (Signed)
History of hyperlipidemia on high-dose atorvastatin with lipid profile performed 06/23/2020 revealing total cholesterol 72, LDL 30 and HDL 28.

## 2020-11-19 NOTE — Assessment & Plan Note (Signed)
History of PAD status post multiple interventions on his lower extremities well outlined in previous notes which we followed by duplex ultrasound.

## 2020-12-07 DIAGNOSIS — G4733 Obstructive sleep apnea (adult) (pediatric): Secondary | ICD-10-CM | POA: Diagnosis not present

## 2020-12-22 ENCOUNTER — Encounter (HOSPITAL_COMMUNITY): Payer: Medicare HMO

## 2020-12-23 DIAGNOSIS — H401234 Low-tension glaucoma, bilateral, indeterminate stage: Secondary | ICD-10-CM | POA: Diagnosis not present

## 2020-12-23 DIAGNOSIS — Z961 Presence of intraocular lens: Secondary | ICD-10-CM | POA: Diagnosis not present

## 2020-12-23 DIAGNOSIS — H34811 Central retinal vein occlusion, right eye, with macular edema: Secondary | ICD-10-CM | POA: Diagnosis not present

## 2020-12-23 DIAGNOSIS — H35352 Cystoid macular degeneration, left eye: Secondary | ICD-10-CM | POA: Diagnosis not present

## 2020-12-23 DIAGNOSIS — H35372 Puckering of macula, left eye: Secondary | ICD-10-CM | POA: Diagnosis not present

## 2020-12-27 DIAGNOSIS — G4733 Obstructive sleep apnea (adult) (pediatric): Secondary | ICD-10-CM | POA: Diagnosis not present

## 2020-12-27 DIAGNOSIS — I25709 Atherosclerosis of coronary artery bypass graft(s), unspecified, with unspecified angina pectoris: Secondary | ICD-10-CM | POA: Diagnosis not present

## 2020-12-27 DIAGNOSIS — N401 Enlarged prostate with lower urinary tract symptoms: Secondary | ICD-10-CM | POA: Diagnosis not present

## 2020-12-27 DIAGNOSIS — E1142 Type 2 diabetes mellitus with diabetic polyneuropathy: Secondary | ICD-10-CM | POA: Diagnosis not present

## 2020-12-27 DIAGNOSIS — E1151 Type 2 diabetes mellitus with diabetic peripheral angiopathy without gangrene: Secondary | ICD-10-CM | POA: Diagnosis not present

## 2020-12-27 DIAGNOSIS — Z23 Encounter for immunization: Secondary | ICD-10-CM | POA: Diagnosis not present

## 2020-12-27 DIAGNOSIS — I480 Paroxysmal atrial fibrillation: Secondary | ICD-10-CM | POA: Diagnosis not present

## 2020-12-27 DIAGNOSIS — Z7984 Long term (current) use of oral hypoglycemic drugs: Secondary | ICD-10-CM | POA: Diagnosis not present

## 2020-12-29 ENCOUNTER — Other Ambulatory Visit: Payer: Self-pay | Admitting: Cardiovascular Disease

## 2020-12-29 ENCOUNTER — Other Ambulatory Visit: Payer: Self-pay

## 2020-12-29 ENCOUNTER — Encounter (HOSPITAL_COMMUNITY): Payer: Medicare HMO

## 2020-12-29 ENCOUNTER — Other Ambulatory Visit (HOSPITAL_COMMUNITY): Payer: Self-pay | Admitting: Cardiovascular Disease

## 2020-12-29 ENCOUNTER — Ambulatory Visit (HOSPITAL_BASED_OUTPATIENT_CLINIC_OR_DEPARTMENT_OTHER)
Admission: RE | Admit: 2020-12-29 | Discharge: 2020-12-29 | Disposition: A | Discharge status: Home / Self Care | Payer: Medicare HMO | Source: Ambulatory Visit | Attending: Cardiology | Admitting: Cardiology

## 2020-12-29 ENCOUNTER — Ambulatory Visit (HOSPITAL_COMMUNITY)
Admission: RE | Admit: 2020-12-29 | Discharge: 2020-12-29 | Disposition: A | Discharge status: Home / Self Care | Payer: Medicare HMO | Source: Ambulatory Visit | Attending: Cardiology | Admitting: Cardiology

## 2020-12-29 DIAGNOSIS — Z95828 Presence of other vascular implants and grafts: Secondary | ICD-10-CM

## 2020-12-29 DIAGNOSIS — I739 Peripheral vascular disease, unspecified: Secondary | ICD-10-CM | POA: Insufficient documentation

## 2020-12-29 NOTE — Telephone Encounter (Signed)
Prescription refill request for Xarelto received.  Indication:atrial fib Last office visit:8/22 Weight:76.6 kg Age:80 Scr:1.0 CrCl:62.77 ml/min  Prescription refilled

## 2021-01-04 ENCOUNTER — Ambulatory Visit: Payer: Medicare HMO | Admitting: Cardiovascular Disease

## 2021-01-07 ENCOUNTER — Emergency Department (HOSPITAL_BASED_OUTPATIENT_CLINIC_OR_DEPARTMENT_OTHER)
Admission: EM | Admit: 2021-01-07 | Discharge: 2021-01-07 | Disposition: A | Discharge status: Home / Self Care | Payer: Medicare HMO | Attending: Emergency Medicine | Admitting: Emergency Medicine

## 2021-01-07 ENCOUNTER — Other Ambulatory Visit: Payer: Self-pay

## 2021-01-07 ENCOUNTER — Encounter (HOSPITAL_BASED_OUTPATIENT_CLINIC_OR_DEPARTMENT_OTHER): Payer: Self-pay | Admitting: Emergency Medicine

## 2021-01-07 ENCOUNTER — Emergency Department (HOSPITAL_BASED_OUTPATIENT_CLINIC_OR_DEPARTMENT_OTHER): Payer: Medicare HMO

## 2021-01-07 DIAGNOSIS — Z87891 Personal history of nicotine dependence: Secondary | ICD-10-CM | POA: Diagnosis not present

## 2021-01-07 DIAGNOSIS — Z7901 Long term (current) use of anticoagulants: Secondary | ICD-10-CM | POA: Insufficient documentation

## 2021-01-07 DIAGNOSIS — I10 Essential (primary) hypertension: Secondary | ICD-10-CM | POA: Diagnosis not present

## 2021-01-07 DIAGNOSIS — S7001XA Contusion of right hip, initial encounter: Secondary | ICD-10-CM | POA: Diagnosis not present

## 2021-01-07 DIAGNOSIS — I4891 Unspecified atrial fibrillation: Secondary | ICD-10-CM | POA: Insufficient documentation

## 2021-01-07 DIAGNOSIS — I2510 Atherosclerotic heart disease of native coronary artery without angina pectoris: Secondary | ICD-10-CM | POA: Insufficient documentation

## 2021-01-07 DIAGNOSIS — Z91010 Allergy to peanuts: Secondary | ICD-10-CM | POA: Insufficient documentation

## 2021-01-07 DIAGNOSIS — Z955 Presence of coronary angioplasty implant and graft: Secondary | ICD-10-CM | POA: Insufficient documentation

## 2021-01-07 DIAGNOSIS — S50311A Abrasion of right elbow, initial encounter: Secondary | ICD-10-CM | POA: Diagnosis not present

## 2021-01-07 DIAGNOSIS — E119 Type 2 diabetes mellitus without complications: Secondary | ICD-10-CM | POA: Diagnosis not present

## 2021-01-07 DIAGNOSIS — W01198A Fall on same level from slipping, tripping and stumbling with subsequent striking against other object, initial encounter: Secondary | ICD-10-CM | POA: Diagnosis not present

## 2021-01-07 DIAGNOSIS — Z7984 Long term (current) use of oral hypoglycemic drugs: Secondary | ICD-10-CM | POA: Diagnosis not present

## 2021-01-07 DIAGNOSIS — Z79899 Other long term (current) drug therapy: Secondary | ICD-10-CM | POA: Diagnosis not present

## 2021-01-07 DIAGNOSIS — Z85828 Personal history of other malignant neoplasm of skin: Secondary | ICD-10-CM | POA: Diagnosis not present

## 2021-01-07 DIAGNOSIS — W19XXXA Unspecified fall, initial encounter: Secondary | ICD-10-CM

## 2021-01-07 DIAGNOSIS — M19021 Primary osteoarthritis, right elbow: Secondary | ICD-10-CM | POA: Diagnosis not present

## 2021-01-07 DIAGNOSIS — Z950 Presence of cardiac pacemaker: Secondary | ICD-10-CM | POA: Insufficient documentation

## 2021-01-07 DIAGNOSIS — S59901A Unspecified injury of right elbow, initial encounter: Secondary | ICD-10-CM | POA: Diagnosis not present

## 2021-01-07 DIAGNOSIS — Y92410 Unspecified street and highway as the place of occurrence of the external cause: Secondary | ICD-10-CM | POA: Insufficient documentation

## 2021-01-07 DIAGNOSIS — M25551 Pain in right hip: Secondary | ICD-10-CM | POA: Diagnosis not present

## 2021-01-07 DIAGNOSIS — S79911A Unspecified injury of right hip, initial encounter: Secondary | ICD-10-CM | POA: Diagnosis present

## 2021-01-07 NOTE — ED Triage Notes (Signed)
Fall yesterday , landed on right side on concrete . No head injury. right elbow , right hip pain. Ambulated with crutches to triage . On blood thinner.

## 2021-01-07 NOTE — Discharge Instructions (Addendum)
X-rays of the right hip right elbow without any bony abnormalities.  Follow-up with sports medicine or your orthopedic doctor.  Would recommend Tylenol as needed.

## 2021-01-07 NOTE — ED Provider Notes (Signed)
[name] Provider Note   CSN: [id number] Arrival date & time: 01/07/21  1820     History Chief Complaint  Patient presents with   Robert Burns is a 80 y.o. male.  Patient had a fall yesterday.  He tripped over a speed bump.  Falling onto his right side.  Has an abrasion to his right elbow and has a bruise to his right hip.  Patient ambulating using a crutch.  Did not hit his head no loss of consciousness.  Tetanus is up-to-date.  Patient is on the blood thinner Xarelto for atrial fibrillation.  Appears that patient also has a pacemaker.  Past medical history significant coronary artery disease peripheral arterial disease.  History of sick sinus disease and diabetes.      Past Medical History:  Diagnosis Date   Arthritis    "all over" (11/25/2015)   CAD (coronary artery disease)    a. s/p CABG  06/2002; b. 02/01/15 PCI: DES to prox SVG to PDA, staged PCI of SVG to Diag in 02/2015; c. 04/2017 Cath/PCI: LM nl, LAD 100ost, 48m, 75d, LCX 60ost, OM2 80, RCA 100ost, RPDA 80, LIMA->LAD ok, VG->D1 patent stent, VG->RPDA patent stent, 50p, VG->OM1->OM2 90p (3.0x24 Synergy DES), 100 between OM1->OM2 (med rx).   Cancer [facility])    Right Shoulder, Left Leg- BCC, SCC, AND MELANOMA   Epithelioid hemangioendothelioma    s/p resection of left SFA/mass with interposition 6 mm GoreTex graft 06/02/10, s/p repeat resection for positive margins 09/22/10   Hyperlipidemia    Hypertension    Leg pain    OSA on CPAP    PAD (peripheral artery disease) ([place])    a. 10/2002 L SFA PTA/BMS; b. 8/17 LE Angio: LEIA 90 (9x40 self exp stent), LSFA short segment prox occlusion (staged PTA/stenting 01/03/2016), patent mid stent, RSFA 17m (staged PTA/DEB 02/14/2016).   Presence of permanent cardiac pacemaker    Medtronic   SSS (sick sinus syndrome) ([place])    a. s/p PPM in 2007 with gen change 04/2015 - Medtronic Adapta ADDRL1, ser # [id number].   Type II diabetes mellitus (HCC)     Type II   Urgency of urination     Patient Active Problem List   Diagnosis Date Noted   Chronic anticoagulation 08/12/2019   Dyslipidemia (high LDL; low HDL) 03/05/2019   Unstable angina ([place]) 11/11/2018   Hypercholesterolemia 02/20/2018   Paroxysmal atrial fibrillation ([place]) 02/20/2018   CAD (coronary artery disease) of bypass graft 02/06/2018   CAD, multiple vessel 02/06/2018   Leukocytosis 02/16/2016   Peripheral arterial disease ([place])    Pacemaker 04/23/2015   Positive cardiac stress test    OSA on CPAP 11/04/2012   SSS (sick sinus syndrome), medtronic adapta    Hyperlipidemia due to type 2 diabetes mellitus ([place])    Superficial femoral artery injury 05/09/2011   SPRAIN&STRAIN OTHER SPECIFIED SITES KNEE&LEG 01/31/2010   Type 2 diabetes mellitus with complication, without long-term current use of insulin ([place]) 11/26/2006   Essential hypertension 11/26/2006    Past Surgical History:  Procedure Laterality Date   CARDIAC CATHETERIZATION N/A 02/01/2015   Procedure: Left Heart Cath and Coronary Angiography;  Surgeon: Lorretta Harp, MD;  Location: [place] [facility];  Service: Cardiovascular;  Laterality: N/A;   CARDIAC CATHETERIZATION N/A 02/01/2015   Procedure: Coronary Stent Intervention;  Surgeon: Lorretta Harp, MD;  Location: [place] [facility];  Service: Cardiovascular;  Laterality: N/A;   CARDIAC CATHETERIZATION  06/2002   "just before bypass OR"   CARDIAC CATHETERIZATION N/A 03/01/2015   Procedure: Coronary Stent Intervention;  Surgeon: Lorretta Harp, MD;  Location: [place] [facility];  Service: Cardiovascular;  Laterality: N/A;   CARDIAC CATHETERIZATION  02/06/2018   COLONOSCOPY     CORONARY ANGIOPLASTY     CORONARY ARTERY BYPASS GRAFT  06/2002   x5, LIMA-LAD;VG- Diag; seq VG- ramus & OM branch; VG-PDA   CORONARY STENT INTERVENTION N/A 04/12/2017   Procedure: CORONARY STENT INTERVENTION;  Surgeon: Lorretta Harp, MD;  Location: [place] [facility];  Service:  Cardiovascular;  Laterality: N/A;   CORONARY STENT INTERVENTION N/A 02/08/2018   Procedure: CORONARY STENT INTERVENTION;  Surgeon: Jettie Booze, MD;  Location: [place] [facility];  Service: Cardiovascular;  Laterality: N/A;   CORONARY STENT INTERVENTION N/A 11/12/2018   Procedure: CORONARY STENT INTERVENTION;  Surgeon: Wellington Hampshire, MD;  Location: [place] [facility];  Service: Cardiovascular;  Laterality: N/A;   CORONARY STENT INTERVENTION N/A 06/22/2020   Procedure: CORONARY STENT INTERVENTION;  Surgeon: Jettie Booze, MD;  Location: [place] [facility];  Service: Cardiovascular;  Laterality: N/A;   EP IMPLANTABLE DEVICE N/A 04/23/2015   Procedure: PPM Generator Changeout;  Surgeon: Sanda Klein, MD;  Location: [place] [facility];  Service: Cardiovascular;  Laterality: N/A;   FALSE ANEURYSM REPAIR Left 11/29/2018   Procedure: REPAIR FALSE ANEURYSM LEFT RADIAL ARTERY;  Surgeon: [name], MD;  Location: [place];  Service: Vascular;  Laterality: Left;   FEMORAL ARTERY STENT Left ~ 2014   "taken out of my leg; couldn' catorgorize what kind so the put it under all 3"; cataroziepitheloid hemanioendotheliomau   FOOT FRACTURE SURGERY Left [place] / REPLACE / REMOVE PACEMAKER  10/13/05   right side, medtronic Adapta   KNEE HARDWARE REMOVAL Right 1950's   "3-4 months after the insertion"   KNEE SURGERY Right 1950's   "broke my lower leg; had to put pin in my knee to keep lower leg in place til it healed"   LEFT HEART CATH AND CORS/GRAFTS ANGIOGRAPHY N/A 04/12/2017   Procedure: LEFT HEART CATH AND CORS/GRAFTS ANGIOGRAPHY;  Surgeon: Lorretta Harp, MD;  Location: [place] [facility];  Service: Cardiovascular;  Laterality: N/A;   LEFT HEART CATH AND CORS/GRAFTS ANGIOGRAPHY N/A 02/06/2018   Procedure: LEFT HEART CATH AND CORS/GRAFTS ANGIOGRAPHY;  Surgeon: Troy Sine, MD;  Location: [place] [facility];  Service: Cardiovascular;  Laterality: N/A;   LEFT HEART  CATH AND CORS/GRAFTS ANGIOGRAPHY N/A 11/12/2018   Procedure: LEFT HEART CATH AND CORS/GRAFTS ANGIOGRAPHY;  Surgeon: Wellington Hampshire, MD;  Location: [place] [facility];  Service: Cardiovascular;  Laterality: N/A;   LEFT HEART CATH AND CORS/GRAFTS ANGIOGRAPHY N/A 06/22/2020   Procedure: LEFT HEART CATH AND CORS/GRAFTS ANGIOGRAPHY;  Surgeon: Jettie Booze, MD;  Location: [place] [facility];  Service: Cardiovascular;  Laterality: N/A;   PERIPHERAL VASCULAR CATHETERIZATION N/A 11/25/2015   Procedure: Lower Extremity Angiography;  Surgeon: Lorretta Harp, MD;  Location: [place] [facility];  Service: Cardiovascular;  Laterality: N/A;   PERIPHERAL VASCULAR CATHETERIZATION Left 11/25/2015   Procedure: Peripheral Vascular Intervention;  Surgeon: Lorretta Harp, MD;  Location: [place] [facility];  Service: Cardiovascular;  Laterality: Left;  external iliac   PERIPHERAL VASCULAR CATHETERIZATION N/A 01/03/2016   Procedure: Lower Extremity Angiography;  Surgeon: Lorretta Harp, MD;  Location: [place] [facility];  Service: Cardiovascular;  Laterality: N/A;   PERIPHERAL VASCULAR CATHETERIZATION Left 01/03/2016   Procedure: Peripheral Vascular Intervention;  Surgeon: Lorretta Harp, MD;  Location: [place] [facility];  Service: Cardiovascular;  Laterality: Left;  SFA   PERIPHERAL VASCULAR CATHETERIZATION Right 02/14/2016   Procedure: Peripheral Vascular Atherectomy;  Surgeon: Lorretta Harp, MD;  Location: [place] [facility];  Service: Cardiovascular;  Laterality: Right;  SFA   POPLITEAL ARTERY STENT  01/03/2016   Contralateral access with a 7 [place] crossover sheath (second order catheter placement)   TONSILLECTOMY AND ADENOIDECTOMY     TUMOR EXCISION Right ~ 2005   cancerous tumor removed from shoulder   TUMOR EXCISION Right ~ 2000   benign tumor removed from under shoulder   TUMOR EXCISION Left 06/02/2010   resection of Lt SFA wth interposition of Gore-Tex graft       Family History  Problem  Relation Age of Onset   Coronary artery disease Mother    Heart attack Mother    Hypertension Mother    Heart disease Mother        Open  Heart surgery   Diabetes Father    Heart disease Father    Hyperlipidemia Father    Heart attack Father    Hypertension Sister    Diabetes Sister    Heart disease Sister     Social History   Tobacco Use   Smoking status: Former    Types: Pipe    Quit date: 1973    Years since quitting: 49.7   Smokeless tobacco: Never   Tobacco comments:    "quit smoking in 1973"  Vaping Use   Vaping Use: Never used  Substance Use Topics   Alcohol use: No   Drug use: No    Home Medications Prior to Admission medications   Medication Sig Start Date End Date Taking? Authorizing Provider  amLODipine (NORVASC) 10 MG tablet Take 10 mg by mouth daily.    [provider]  atorvastatin (LIPITOR) 80 MG tablet Take 1 tablet (80 mg total) by mouth daily at 6 PM. 02/09/18   Eileen Stanford, PA-C  Cholecalciferol (VITAMIN D3) 2000 units capsule Take 2,000 Units by mouth 2 (two) times daily.    [provider]  clopidogrel (PLAVIX) 75 MG tablet Take 1 tablet (75 mg total) by mouth daily with breakfast. 06/24/20   Duke, Tami Lin, PA  dorzolamide-timolol (COSOPT) 22.3-6.8 MG/ML ophthalmic solution Place 1 drop into both eyes 2 (two) times daily. 04/30/20   [provider]  glipiZIDE (GLUCOTROL) 5 MG tablet Take 5 mg by mouth daily with breakfast.  01/10/18   [provider]  hydrALAZINE (APRESOLINE) 25 MG tablet Take 1 tablet (25 mg total) by mouth as needed (for BP >150 sys or 90 dia). 09/23/20   Deberah Pelton, NP  hydrochlorothiazide (HYDRODIURIL) 25 MG tablet Take 25 mg by mouth every morning.    [provider]  isosorbide mononitrate (IMDUR) 60 MG 24 hr tablet Take 1 tablet (60 mg total) by mouth daily. 12/29/20   Lorretta Harp, MD  ketorolac (ACULAR) 0.4 % SOLN Place 1 drop into both eyes 4 (four) times daily.  06/01/20   [provider]  melatonin 5 MG TABS Take 5 mg by mouth at bedtime.    [provider]  metFORMIN (GLUCOPHAGE) 1000 MG tablet Take 1 tablet (1,000 mg total) by mouth 2 (two) times daily with a meal. Restart on 3/18. 06/23/20   Fabian Sharp  Elmyra Ricks, PA  metoprolol tartrate (LOPRESSOR) 100 MG tablet Take 1 tablet (100 mg total) by mouth 2 (two) times daily. 08/07/17   Lendon Colonel, NP  MODERNA COVID-19 VACCINE 100 MCG/0.5ML injection  08/25/20   [provider]  MULTIPLE VITAMIN-FOLIC ACID PO Take 1 tablet by mouth daily.    [provider]  neomycin-polymyxin-hydrocortisone (CORTISPORIN) 3.5-10000-1 OTIC suspension Place 3 drops into both ears 3 (three) times daily. 02/01/20   Kandra Nicolas, MD  nitroGLYCERIN (NITROSTAT) 0.4 MG SL tablet Place 1 tablet (0.4 mg total) under the tongue every 5 (five) minutes as needed for chest pain. 04/15/17   Theora Gianotti, NP  pantoprazole (PROTONIX) 20 MG tablet Take 20 mg by mouth daily.    [provider]  potassium chloride SA (K-DUR,KLOR-CON) 20 MEQ tablet Take 1 tablet (20 mEq total) by mouth daily. 02/18/16   Regalado, Belkys A, MD  ranolazine (RANEXA) 1000 MG SR tablet Take 1 tablet (1,000 mg total) by mouth 2 (two) times daily. 11/09/20   O'NealCassie Freer, MD  rivaroxaban (XARELTO) 20 MG TABS tablet TAKE 1 TABLET BY MOUTH DAILY WITH SUPPER 12/29/20   Croitoru, Mihai, MD  rOPINIRole (REQUIP) 1 MG tablet Take 1 mg by mouth at bedtime.    [provider]  tamsulosin (FLOMAX) 0.4 MG CAPS capsule Take 0.4 mg by mouth 2 (two) times daily.  03/09/15   [provider]    Allergies    Peanut-containing drug products, Ace inhibitors, Diltiazem hcl, Meloxicam, Eliquis [apixaban], Sertraline hcl, Carvedilol, Clonidine hcl, and Duloxetine hcl  Review of Systems   Review of Systems  Constitutional:  Negative for chills and fever.  HENT:  Negative for ear pain and sore throat.    Eyes:  Negative for pain and visual disturbance.  Respiratory:  Negative for cough and shortness of breath.   Cardiovascular:  Negative for chest pain and palpitations.  Gastrointestinal:  Negative for abdominal pain and vomiting.  Genitourinary:  Negative for dysuria and hematuria.  Musculoskeletal:  Negative for arthralgias and back pain.  Skin:  Positive for wound. Negative for color change and rash.  Neurological:  Negative for seizures and syncope.  Hematological:  Bruises/bleeds easily.  All other systems reviewed and are negative.  Physical Exam Updated Vital Signs BP (!) 168/69   Pulse 64   Temp 98.7 F (37.1 C) (Oral)   Resp 18   SpO2 98%   Physical Exam Vitals and nursing note reviewed.  Constitutional:      Appearance: He is well-developed.  HENT:     Head: Normocephalic and atraumatic.  Eyes:     Extraocular Movements: Extraocular movements intact.     Conjunctiva/sclera: Conjunctivae normal.     Pupils: Pupils are equal, round, and reactive to light.  Neck:     Comments: No tenderness to palpation to the posterior cervical spine. Cardiovascular:     Rate and Rhythm: Normal rate and regular rhythm.     Heart sounds: No murmur heard. Pulmonary:     Effort: Pulmonary effort is normal. No respiratory distress.     Breath sounds: Normal breath sounds.  Abdominal:     Palpations: Abdomen is soft.     Tenderness: There is no abdominal tenderness.  Musculoskeletal:     Cervical back: Normal range of motion and neck supple. No rigidity or tenderness.     Comments: Right elbow has an abrasion measuring about 2 x 3 cm.  No active bleeding.  No significant swelling  to the elbow.  Pretty good range of motion at the elbow.  Excellent range of motion at the wrist and fingers and shoulder.  Radial pulses 2+.  Sensations intact.  The right hip has a large area of contusion measuring probably about 5 x 15 cm.  There is no shortening of the leg there is no deformity.  No  swelling at the knee no swelling at the ankle.  Good movement of toes foot ankle knee some discomfort with range of motion of the hip.  There is no foreshortening or rotation of the leg that is abnormal.  Dorsalis pedis pulses 1+.  Sensations intact distally.  Skin:    General: Skin is warm and dry.     Capillary Refill: Capillary refill takes less than 2 seconds.  Neurological:     General: No focal deficit present.     Mental Status: He is alert and oriented to person, place, and time.    ED Results / Procedures / Treatments   Labs (all labs ordered are listed, but only abnormal results are displayed) Labs Reviewed - No data to display  EKG None  Radiology DG Elbow Complete Right  Result Date: 01/07/2021 CLINICAL DATA:  Trip and fall yesterday with elbow pain, initial encounter EXAM: RIGHT ELBOW - COMPLETE 3+ VIEW COMPARISON:  None. FINDINGS: Degenerative changes are noted about the elbow joint. No joint effusion is seen. No acute fracture or dislocation is noted. No soft tissue changes are noted. IMPRESSION: Degenerative change without acute abnormality. Electronically Signed   By: Inez Catalina M.D.   On: 01/07/2021 21:10   DG Hip Unilat With Pelvis 2-3 Views Right  Result Date: 01/07/2021 CLINICAL DATA:  Trip and fall yesterday with right hip pain, initial encounter EXAM: DG HIP (WITH OR WITHOUT PELVIS) 3V RIGHT COMPARISON:  None. FINDINGS: Pelvic ring is intact. Vascular stents are noted in the left iliac and femoral system. Degenerative changes of the hip joints and lumbar spine are seen. No acute fracture or dislocation is noted. No soft tissue abnormality is seen. IMPRESSION: No acute abnormality noted. Electronically Signed   By: Inez Catalina M.D.   On: 01/07/2021 21:11    Procedures Procedures   Medications Ordered in ED Medications - No data to display  ED Course  I have reviewed the triage vital signs and the nursing notes.  Pertinent labs & imaging results that were  available during my care of the patient were reviewed by me and considered in my medical decision making (see chart for details).    MDM Rules/Calculators/A&P                           X-ray of the left elbow without any bony abnormality.  X-ray of the right hip and pelvis without any bony abnormality.  Clinically do not have a high suspicion for hip fracture.  Do feel that CT scan is not necessary.  Patient stable for discharge.  We will have patient follow-up with his orthopedic doctor or we will give him referral to Dr. Rosiland Oz with sports medicine.   Final Clinical Impression(s) / ED Diagnoses Final diagnoses:  Fall, initial encounter  Abrasion of right elbow, initial encounter  Contusion of right hip, initial encounter    Rx / DC Orders ED Discharge Orders     None        Fredia Sorrow, MD 01/07/21 2127

## 2021-01-10 ENCOUNTER — Ambulatory Visit: Payer: Medicare HMO | Admitting: Family Medicine

## 2021-01-10 VITALS — Ht 68.0 in | Wt 169.0 lb

## 2021-01-10 DIAGNOSIS — M19021 Primary osteoarthritis, right elbow: Secondary | ICD-10-CM | POA: Diagnosis not present

## 2021-01-10 DIAGNOSIS — S7001XA Contusion of right hip, initial encounter: Secondary | ICD-10-CM

## 2021-01-10 NOTE — Progress Notes (Signed)
Robert Burns - 80 y.o. male MRN [id number]  Date of birth: October 14, 1940  SUBJECTIVE:  Including CC & ROS.  No chief complaint on file.   Robert Burns is a 80 y.o. male that is presenting with right hip pain.  He had a fall a few days ago at [employer].  He landed on his right side.  Having some swelling and bruising over the lateral hip on the right side.  No pain at his elbow today and has good range of motion.  Independent review of right elbow x-ray from 9/30 shows degenerative changes around the elbow joint.  Independent review of the pelvis x-ray and right hip from 9/30 shows calcific changes of the greater trochanter   Review of Systems See HPI   HISTORY: Past Medical, Surgical, Social, and Family History Reviewed & Updated per EMR.   Pertinent Historical Findings include:  Past Medical History:  Diagnosis Date   Arthritis    "all over" (11/25/2015)   CAD (coronary artery disease)    a. s/p CABG  06/2002; b. 02/01/15 PCI: DES to prox SVG to PDA, staged PCI of SVG to Diag in 02/2015; c. 04/2017 Cath/PCI: LM nl, LAD 100ost, 92m, 75d, LCX 60ost, OM2 80, RCA 100ost, RPDA 80, LIMA->LAD ok, VG->D1 patent stent, VG->RPDA patent stent, 50p, VG->OM1->OM2 90p (3.0x24 Synergy DES), 100 between OM1->OM2 (med rx).   Cancer [facility])    Right Shoulder, Left Leg- BCC, SCC, AND MELANOMA   Epithelioid hemangioendothelioma    s/p resection of left SFA/mass with interposition 6 mm GoreTex graft 06/02/10, s/p repeat resection for positive margins 09/22/10   Hyperlipidemia    Hypertension    Leg pain    OSA on CPAP    PAD (peripheral artery disease) ([place])    a. 10/2002 L SFA PTA/BMS; b. 8/17 LE Angio: LEIA 90 (9x40 self exp stent), LSFA short segment prox occlusion (staged PTA/stenting 01/03/2016), patent mid stent, RSFA 22m (staged PTA/DEB 02/14/2016).   Presence of permanent cardiac pacemaker    Medtronic   SSS (sick sinus syndrome) ([place])    a. s/p PPM in 2007 with gen change 04/2015 - Medtronic Adapta  ADDRL1, ser # [id number].   Type II diabetes mellitus (HCC)    Type II   Urgency of urination     Past Surgical History:  Procedure Laterality Date   CARDIAC CATHETERIZATION N/A 02/01/2015   Procedure: Left Heart Cath and Coronary Angiography;  Surgeon: Lorretta Harp, MD;  Location: [place] [facility];  Service: Cardiovascular;  Laterality: N/A;   CARDIAC CATHETERIZATION N/A 02/01/2015   Procedure: Coronary Stent Intervention;  Surgeon: Lorretta Harp, MD;  Location: [place] [facility];  Service: Cardiovascular;  Laterality: N/A;   CARDIAC CATHETERIZATION  06/2002   "just before bypass OR"   CARDIAC CATHETERIZATION N/A 03/01/2015   Procedure: Coronary Stent Intervention;  Surgeon: Lorretta Harp, MD;  Location: [place] [facility];  Service: Cardiovascular;  Laterality: N/A;   CARDIAC CATHETERIZATION  02/06/2018   COLONOSCOPY     CORONARY ANGIOPLASTY     CORONARY ARTERY BYPASS GRAFT  06/2002   x5, LIMA-LAD;VG- Diag; seq VG- ramus & OM branch; VG-PDA   CORONARY STENT INTERVENTION N/A 04/12/2017   Procedure: CORONARY STENT INTERVENTION;  Surgeon: Lorretta Harp, MD;  Location: [place] [facility];  Service: Cardiovascular;  Laterality: N/A;   CORONARY STENT INTERVENTION N/A 02/08/2018   Procedure: CORONARY STENT INTERVENTION;  Surgeon: Jettie Booze, MD;  Location: [place] [facility];  Service: Cardiovascular;  Laterality: N/A;   CORONARY STENT INTERVENTION N/A 11/12/2018   Procedure: CORONARY STENT INTERVENTION;  Surgeon: Wellington Hampshire, MD;  Location: [place] [facility];  Service: Cardiovascular;  Laterality: N/A;   CORONARY STENT INTERVENTION N/A 06/22/2020   Procedure: CORONARY STENT INTERVENTION;  Surgeon: Jettie Booze, MD;  Location: [place] [facility];  Service: Cardiovascular;  Laterality: N/A;   EP IMPLANTABLE DEVICE N/A 04/23/2015   Procedure: PPM Generator Changeout;  Surgeon: Sanda Klein, MD;  Location: [place] [facility];  Service: Cardiovascular;   Laterality: N/A;   FALSE ANEURYSM REPAIR Left 11/29/2018   Procedure: REPAIR FALSE ANEURYSM LEFT RADIAL ARTERY;  Surgeon: [name], MD;  Location: [place];  Service: Vascular;  Laterality: Left;   FEMORAL ARTERY STENT Left ~ 2014   "taken out of my leg; couldn' catorgorize what kind so the put it under all 3"; cataroziepitheloid hemanioendotheliomau   FOOT FRACTURE SURGERY Left [place] / REPLACE / REMOVE PACEMAKER  10/13/05   right side, medtronic Adapta   KNEE HARDWARE REMOVAL Right 1950's   "3-4 months after the insertion"   KNEE SURGERY Right 1950's   "broke my lower leg; had to put pin in my knee to keep lower leg in place til it healed"   LEFT HEART CATH AND CORS/GRAFTS ANGIOGRAPHY N/A 04/12/2017   Procedure: LEFT HEART CATH AND CORS/GRAFTS ANGIOGRAPHY;  Surgeon: Lorretta Harp, MD;  Location: [place] [facility];  Service: Cardiovascular;  Laterality: N/A;   LEFT HEART CATH AND CORS/GRAFTS ANGIOGRAPHY N/A 02/06/2018   Procedure: LEFT HEART CATH AND CORS/GRAFTS ANGIOGRAPHY;  Surgeon: Troy Sine, MD;  Location: [place] [facility];  Service: Cardiovascular;  Laterality: N/A;   LEFT HEART CATH AND CORS/GRAFTS ANGIOGRAPHY N/A 11/12/2018   Procedure: LEFT HEART CATH AND CORS/GRAFTS ANGIOGRAPHY;  Surgeon: Wellington Hampshire, MD;  Location: [place] [facility];  Service: Cardiovascular;  Laterality: N/A;   LEFT HEART CATH AND CORS/GRAFTS ANGIOGRAPHY N/A 06/22/2020   Procedure: LEFT HEART CATH AND CORS/GRAFTS ANGIOGRAPHY;  Surgeon: Jettie Booze, MD;  Location: [place] [facility];  Service: Cardiovascular;  Laterality: N/A;   PERIPHERAL VASCULAR CATHETERIZATION N/A 11/25/2015   Procedure: Lower Extremity Angiography;  Surgeon: Lorretta Harp, MD;  Location: [place] [facility];  Service: Cardiovascular;  Laterality: N/A;   PERIPHERAL VASCULAR CATHETERIZATION Left 11/25/2015   Procedure: Peripheral Vascular Intervention;  Surgeon: Lorretta Harp, MD;  Location:  [place] [facility];  Service: Cardiovascular;  Laterality: Left;  external iliac   PERIPHERAL VASCULAR CATHETERIZATION N/A 01/03/2016   Procedure: Lower Extremity Angiography;  Surgeon: Lorretta Harp, MD;  Location: [place] [facility];  Service: Cardiovascular;  Laterality: N/A;   PERIPHERAL VASCULAR CATHETERIZATION Left 01/03/2016   Procedure: Peripheral Vascular Intervention;  Surgeon: Lorretta Harp, MD;  Location: [place] [facility];  Service: Cardiovascular;  Laterality: Left;  SFA   PERIPHERAL VASCULAR CATHETERIZATION Right 02/14/2016   Procedure: Peripheral Vascular Atherectomy;  Surgeon: Lorretta Harp, MD;  Location: [place] [facility];  Service: Cardiovascular;  Laterality: Right;  SFA   POPLITEAL ARTERY STENT  01/03/2016   Contralateral access with a 7 [place] crossover sheath (second order catheter placement)   TONSILLECTOMY AND ADENOIDECTOMY     TUMOR EXCISION Right ~ 2005   cancerous tumor removed from shoulder   TUMOR EXCISION Right ~ 2000   benign tumor removed from under shoulder   TUMOR  EXCISION Left 06/02/2010   resection of Lt SFA wth interposition of Gore-Tex graft    Family History  Problem Relation Age of Onset   Coronary artery disease Mother    Heart attack Mother    Hypertension Mother    Heart disease Mother        Open  Heart surgery   Diabetes Father    Heart disease Father    Hyperlipidemia Father    Heart attack Father    Hypertension Sister    Diabetes Sister    Heart disease Sister     Social History   Socioeconomic History   Marital status: Married    Spouse name: Not on file   Number of children: Not on file   Years of education: Not on file   Highest education level: Not on file  Occupational History   Not on file  Tobacco Use   Smoking status: Former    Types: Pipe    Quit date: 43    Years since quitting: 49.7   Smokeless tobacco: Never   Tobacco comments:    "quit smoking in 1973"  Vaping Use   Vaping Use: Never used   Substance and Sexual Activity   Alcohol use: No   Drug use: No   Sexual activity: Not Currently  Other Topics Concern   Not on file  Social History Narrative   Not on file   Social Determinants of Health   Financial Resource Strain: Not on file  Food Insecurity: Not on file  Transportation Needs: Not on file  Physical Activity: Not on file  Stress: Not on file  Social Connections: Not on file  Intimate Partner Violence: Not on file     PHYSICAL EXAM:  VS: Ht 5\' 8"  (1.727 m)   Wt 169 lb (76.7 kg)   BMI 25.70 kg/m  Physical Exam Gen: NAD, alert, cooperative with exam, well-appearing    ASSESSMENT & PLAN:   Primary osteoarthritis of right elbow Has degenerative changes noted around the elbow.  No deficits in his range of motion today. -Counseled on home exercise therapy and supportive care.   Hematoma of right hip Has bruising and hematoma on the lateral aspect.  Normal hip joint range of motion. -Counseled on home exercise therapy and supportive care. -Could consider physical therapy.

## 2021-01-10 NOTE — Assessment & Plan Note (Signed)
Has degenerative changes noted around the elbow.  No deficits in his range of motion today. -Counseled on home exercise therapy and supportive care.

## 2021-01-10 NOTE — Assessment & Plan Note (Signed)
Has bruising and hematoma on the lateral aspect.  Normal hip joint range of motion. -Counseled on home exercise therapy and supportive care. -Could consider physical therapy.

## 2021-01-10 NOTE — Patient Instructions (Signed)
Nice to meet you Please try heat on the area  Please try to massage the area Please try the exercises   Please send me a message in MyChart with any questions or updates.  Please see me back in 2 weeks.   --Dr. Raeford Razor

## 2021-01-24 ENCOUNTER — Ambulatory Visit: Payer: Medicare HMO | Attending: Internal Medicine

## 2021-01-24 ENCOUNTER — Encounter: Payer: Self-pay | Admitting: Family Medicine

## 2021-01-24 ENCOUNTER — Ambulatory Visit: Payer: Medicare HMO | Admitting: Family Medicine

## 2021-01-24 DIAGNOSIS — Z23 Encounter for immunization: Secondary | ICD-10-CM

## 2021-01-24 DIAGNOSIS — S7001XD Contusion of right hip, subsequent encounter: Secondary | ICD-10-CM | POA: Diagnosis not present

## 2021-01-24 NOTE — Progress Notes (Signed)
Robert Burns - 80 y.o. male MRN [id number]  Date of birth: 20-Mar-1941  SUBJECTIVE:  Including CC & ROS.  No chief complaint on file.   Robert Burns is a 80 y.o. male that is following up for his elbow and hip pain.  He continues to have some soreness over the hip but has seen improvement of his swelling and bruising.    Review of Systems See HPI   HISTORY: Past Medical, Surgical, Social, and Family History Reviewed & Updated per EMR.   Pertinent Historical Findings include:  Past Medical History:  Diagnosis Date   Arthritis    "all over" (11/25/2015)   CAD (coronary artery disease)    a. s/p CABG  06/2002; b. 02/01/15 PCI: DES to prox SVG to PDA, staged PCI of SVG to Diag in 02/2015; c. 04/2017 Cath/PCI: LM nl, LAD 100ost, 60m, 75d, LCX 60ost, OM2 80, RCA 100ost, RPDA 80, LIMA->LAD ok, VG->D1 patent stent, VG->RPDA patent stent, 50p, VG->OM1->OM2 90p (3.0x24 Synergy DES), 100 between OM1->OM2 (med rx).   Cancer [facility])    Right Shoulder, Left Leg- BCC, SCC, AND MELANOMA   Epithelioid hemangioendothelioma    s/p resection of left SFA/mass with interposition 6 mm GoreTex graft 06/02/10, s/p repeat resection for positive margins 09/22/10   Hyperlipidemia    Hypertension    Leg pain    OSA on CPAP    PAD (peripheral artery disease) ([place])    a. 10/2002 L SFA PTA/BMS; b. 8/17 LE Angio: LEIA 90 (9x40 self exp stent), LSFA short segment prox occlusion (staged PTA/stenting 01/03/2016), patent mid stent, RSFA 9m (staged PTA/DEB 02/14/2016).   Presence of permanent cardiac pacemaker    Medtronic   SSS (sick sinus syndrome) ([place])    a. s/p PPM in 2007 with gen change 04/2015 - Medtronic Adapta ADDRL1, ser # [id number].   Type II diabetes mellitus (HCC)    Type II   Urgency of urination     Past Surgical History:  Procedure Laterality Date   CARDIAC CATHETERIZATION N/A 02/01/2015   Procedure: Left Heart Cath and Coronary Angiography;  Surgeon: Lorretta Harp, MD;  Location: [place]  [facility];  Service: Cardiovascular;  Laterality: N/A;   CARDIAC CATHETERIZATION N/A 02/01/2015   Procedure: Coronary Stent Intervention;  Surgeon: Lorretta Harp, MD;  Location: [place] [facility];  Service: Cardiovascular;  Laterality: N/A;   CARDIAC CATHETERIZATION  06/2002   "just before bypass OR"   CARDIAC CATHETERIZATION N/A 03/01/2015   Procedure: Coronary Stent Intervention;  Surgeon: Lorretta Harp, MD;  Location: [place] [facility];  Service: Cardiovascular;  Laterality: N/A;   CARDIAC CATHETERIZATION  02/06/2018   COLONOSCOPY     CORONARY ANGIOPLASTY     CORONARY ARTERY BYPASS GRAFT  06/2002   x5, LIMA-LAD;VG- Diag; seq VG- ramus & OM branch; VG-PDA   CORONARY STENT INTERVENTION N/A 04/12/2017   Procedure: CORONARY STENT INTERVENTION;  Surgeon: Lorretta Harp, MD;  Location: [place] [facility];  Service: Cardiovascular;  Laterality: N/A;   CORONARY STENT INTERVENTION N/A 02/08/2018   Procedure: CORONARY STENT INTERVENTION;  Surgeon: Jettie Booze, MD;  Location: [place] [facility];  Service: Cardiovascular;  Laterality: N/A;   CORONARY STENT INTERVENTION N/A 11/12/2018   Procedure: CORONARY STENT INTERVENTION;  Surgeon: Wellington Hampshire, MD;  Location: [place] [facility];  Service: Cardiovascular;  Laterality: N/A;   CORONARY STENT INTERVENTION N/A 06/22/2020   Procedure: CORONARY STENT INTERVENTION;  Surgeon: Jettie Booze, MD;  Location: [place] [facility];  Service: Cardiovascular;  Laterality: N/A;   EP IMPLANTABLE DEVICE N/A 04/23/2015   Procedure: PPM Generator Changeout;  Surgeon: Sanda Klein, MD;  Location: [place] [facility];  Service: Cardiovascular;  Laterality: N/A;   FALSE ANEURYSM REPAIR Left 11/29/2018   Procedure: REPAIR FALSE ANEURYSM LEFT RADIAL ARTERY;  Surgeon: [name], MD;  Location: [place];  Service: Vascular;  Laterality: Left;   FEMORAL ARTERY STENT Left ~ 2014   "taken out of my leg; couldn' catorgorize what kind so the put it under all  3"; cataroziepitheloid hemanioendotheliomau   FOOT FRACTURE SURGERY Left [place] / REPLACE / REMOVE PACEMAKER  10/13/05   right side, medtronic Adapta   KNEE HARDWARE REMOVAL Right 1950's   "3-4 months after the insertion"   KNEE SURGERY Right 1950's   "broke my lower leg; had to put pin in my knee to keep lower leg in place til it healed"   LEFT HEART CATH AND CORS/GRAFTS ANGIOGRAPHY N/A 04/12/2017   Procedure: LEFT HEART CATH AND CORS/GRAFTS ANGIOGRAPHY;  Surgeon: Lorretta Harp, MD;  Location: [place] [facility];  Service: Cardiovascular;  Laterality: N/A;   LEFT HEART CATH AND CORS/GRAFTS ANGIOGRAPHY N/A 02/06/2018   Procedure: LEFT HEART CATH AND CORS/GRAFTS ANGIOGRAPHY;  Surgeon: Troy Sine, MD;  Location: [place] [facility];  Service: Cardiovascular;  Laterality: N/A;   LEFT HEART CATH AND CORS/GRAFTS ANGIOGRAPHY N/A 11/12/2018   Procedure: LEFT HEART CATH AND CORS/GRAFTS ANGIOGRAPHY;  Surgeon: Wellington Hampshire, MD;  Location: [place] [facility];  Service: Cardiovascular;  Laterality: N/A;   LEFT HEART CATH AND CORS/GRAFTS ANGIOGRAPHY N/A 06/22/2020   Procedure: LEFT HEART CATH AND CORS/GRAFTS ANGIOGRAPHY;  Surgeon: Jettie Booze, MD;  Location: [place] [facility];  Service: Cardiovascular;  Laterality: N/A;   PERIPHERAL VASCULAR CATHETERIZATION N/A 11/25/2015   Procedure: Lower Extremity Angiography;  Surgeon: Lorretta Harp, MD;  Location: [place] [facility];  Service: Cardiovascular;  Laterality: N/A;   PERIPHERAL VASCULAR CATHETERIZATION Left 11/25/2015   Procedure: Peripheral Vascular Intervention;  Surgeon: Lorretta Harp, MD;  Location: [place] [facility];  Service: Cardiovascular;  Laterality: Left;  external iliac   PERIPHERAL VASCULAR CATHETERIZATION N/A 01/03/2016   Procedure: Lower Extremity Angiography;  Surgeon: Lorretta Harp, MD;  Location: [place] [facility];  Service: Cardiovascular;  Laterality: N/A;   PERIPHERAL VASCULAR  CATHETERIZATION Left 01/03/2016   Procedure: Peripheral Vascular Intervention;  Surgeon: Lorretta Harp, MD;  Location: [place] [facility];  Service: Cardiovascular;  Laterality: Left;  SFA   PERIPHERAL VASCULAR CATHETERIZATION Right 02/14/2016   Procedure: Peripheral Vascular Atherectomy;  Surgeon: Lorretta Harp, MD;  Location: [place] [facility];  Service: Cardiovascular;  Laterality: Right;  SFA   POPLITEAL ARTERY STENT  01/03/2016   Contralateral access with a 7 [place] crossover sheath (second order catheter placement)   TONSILLECTOMY AND ADENOIDECTOMY     TUMOR EXCISION Right ~ 2005   cancerous tumor removed from shoulder   TUMOR EXCISION Right ~ 2000   benign tumor removed from under shoulder   TUMOR EXCISION Left 06/02/2010   resection of Lt SFA wth interposition of Gore-Tex graft    Family History  Problem Relation Age of Onset   Coronary artery disease Mother    Heart attack Mother    Hypertension Mother    Heart disease Mother        Open  Heart surgery   Diabetes Father    Heart disease Father    Hyperlipidemia Father    Heart attack Father    Hypertension Sister    Diabetes Sister    Heart disease Sister     Social History   Socioeconomic History   Marital status: Married    Spouse name: Not on file   Number of children: Not on file   Years of education: Not on file   Highest education level: Not on file  Occupational History   Not on file  Tobacco Use   Smoking status: Former    Types: Pipe    Quit date: 11    Years since quitting: 49.8   Smokeless tobacco: Never   Tobacco comments:    "quit smoking in 1973"  Vaping Use   Vaping Use: Never used  Substance and Sexual Activity   Alcohol use: No   Drug use: No   Sexual activity: Not Currently  Other Topics Concern   Not on file  Social History Narrative   Not on file   Social Determinants of Health   Financial Resource Strain: Not on file  Food Insecurity: Not on file  Transportation  Needs: Not on file  Physical Activity: Not on file  Stress: Not on file  Social Connections: Not on file  Intimate Partner Violence: Not on file     PHYSICAL EXAM:  VS: Ht 5\' 8"  (1.727 m)   Wt 168 lb (76.2 kg)   BMI 25.54 kg/m  Physical Exam Gen: NAD, alert, cooperative with exam, well-appearing      ASSESSMENT & PLAN:   Hematoma of right hip Continues to have improvement since his fall.  Has good range of motion and strength.  Does have some soreness  -Counseled on home exercise therapy and supportive care. -Could consider physical therapy.

## 2021-01-24 NOTE — Assessment & Plan Note (Signed)
Continues to have improvement since his fall.  Has good range of motion and strength.  Does have some soreness  -Counseled on home exercise therapy and supportive care. -Could consider physical therapy.

## 2021-01-25 ENCOUNTER — Ambulatory Visit (INDEPENDENT_AMBULATORY_CARE_PROVIDER_SITE_OTHER): Payer: Medicare HMO

## 2021-01-25 DIAGNOSIS — I495 Sick sinus syndrome: Secondary | ICD-10-CM

## 2021-01-27 LAB — CUP PACEART REMOTE DEVICE CHECK
Battery Impedance: 506 Ohm
Battery Remaining Longevity: 94 mo
Battery Voltage: 2.79 V
Brady Statistic AP VP Percent: 2 %
Brady Statistic AP VS Percent: 97 %
Brady Statistic AS VP Percent: 0 %
Brady Statistic AS VS Percent: 0 %
Date Time Interrogation Session: 20221018133303
Implantable Lead Implant Date: 20070706
Implantable Lead Implant Date: 20070706
Implantable Lead Location: 753859
Implantable Lead Location: 753860
Implantable Lead Model: 5076
Implantable Lead Model: 5092
Implantable Pulse Generator Implant Date: 20170113
Lead Channel Impedance Value: 406 Ohm
Lead Channel Impedance Value: 696 Ohm
Lead Channel Pacing Threshold Amplitude: 0.75 V
Lead Channel Pacing Threshold Amplitude: 1.375 V
Lead Channel Pacing Threshold Pulse Width: 0.4 ms
Lead Channel Pacing Threshold Pulse Width: 0.4 ms
Lead Channel Setting Pacing Amplitude: 1.5 V
Lead Channel Setting Pacing Amplitude: 2.75 V
Lead Channel Setting Pacing Pulse Width: 0.4 ms
Lead Channel Setting Sensing Sensitivity: 5.6 mV

## 2021-01-31 ENCOUNTER — Telehealth: Payer: Self-pay | Admitting: Cardiovascular Disease

## 2021-01-31 MED ORDER — RIVAROXABAN 20 MG PO TABS: 20.0000 mg | ORAL_TABLET | Freq: Every day | ORAL | 0 refills | Status: DC

## 2021-01-31 NOTE — Telephone Encounter (Signed)
Prescription refill request for Xarelto received.  Indication: afib  Last office visit: Gwenlyn Found 11/19/2020 Weight: 76.2 kg  Age: 80 yo  Scr: 1.08, 06/24/2020 CrCl: 57 ml/min   Refill sent

## 2021-01-31 NOTE — Telephone Encounter (Signed)
*  STAT* If patient is at the pharmacy, call can be transferred to refill team.   1. Which medications need to be refilled? (please list name of each medication and dose if known) rivaroxaban (XARELTO) 20 MG TABS tablet  2. Which pharmacy/location (including street and city if local pharmacy) is medication to be sent to? [place], [place]  3. Do they need a 30 day or 90 day supply? 30 day   Patient is completely out of medication. He says there was an error with the mail order refill and needs a month supply sent to a local pharmacy until it is straightened out.

## 2021-02-03 NOTE — Progress Notes (Signed)
Remote pacemaker transmission.   

## 2021-02-07 DIAGNOSIS — M7989 Other specified soft tissue disorders: Secondary | ICD-10-CM | POA: Diagnosis not present

## 2021-02-07 DIAGNOSIS — M2550 Pain in unspecified joint: Secondary | ICD-10-CM | POA: Diagnosis not present

## 2021-02-07 DIAGNOSIS — S0990XA Unspecified injury of head, initial encounter: Secondary | ICD-10-CM | POA: Diagnosis not present

## 2021-02-07 DIAGNOSIS — S50311A Abrasion of right elbow, initial encounter: Secondary | ICD-10-CM | POA: Diagnosis not present

## 2021-02-07 DIAGNOSIS — N390 Urinary tract infection, site not specified: Secondary | ICD-10-CM | POA: Diagnosis not present

## 2021-02-07 DIAGNOSIS — Z7901 Long term (current) use of anticoagulants: Secondary | ICD-10-CM | POA: Diagnosis not present

## 2021-02-07 DIAGNOSIS — R079 Chest pain, unspecified: Secondary | ICD-10-CM | POA: Diagnosis not present

## 2021-02-07 DIAGNOSIS — R42 Dizziness and giddiness: Secondary | ICD-10-CM | POA: Diagnosis not present

## 2021-02-07 DIAGNOSIS — R519 Headache, unspecified: Secondary | ICD-10-CM | POA: Diagnosis not present

## 2021-02-07 DIAGNOSIS — M25511 Pain in right shoulder: Secondary | ICD-10-CM | POA: Diagnosis not present

## 2021-02-07 DIAGNOSIS — I4891 Unspecified atrial fibrillation: Secondary | ICD-10-CM | POA: Diagnosis not present

## 2021-02-07 DIAGNOSIS — M542 Cervicalgia: Secondary | ICD-10-CM | POA: Diagnosis not present

## 2021-02-21 ENCOUNTER — Other Ambulatory Visit (HOSPITAL_BASED_OUTPATIENT_CLINIC_OR_DEPARTMENT_OTHER): Payer: Self-pay

## 2021-02-21 MED ORDER — MODERNA COVID-19 BIVAL BOOSTER 50 MCG/0.5ML IM SUSP
INTRAMUSCULAR | 0 refills | Status: DC
  Filled 2021-02-21: qty 0.5, 1d supply, fill #0

## 2021-02-23 DIAGNOSIS — C4442 Squamous cell carcinoma of skin of scalp and neck: Secondary | ICD-10-CM | POA: Diagnosis not present

## 2021-02-23 DIAGNOSIS — L570 Actinic keratosis: Secondary | ICD-10-CM | POA: Diagnosis not present

## 2021-02-23 DIAGNOSIS — C44229 Squamous cell carcinoma of skin of left ear and external auricular canal: Secondary | ICD-10-CM | POA: Diagnosis not present

## 2021-03-01 DIAGNOSIS — H401234 Low-tension glaucoma, bilateral, indeterminate stage: Secondary | ICD-10-CM | POA: Diagnosis not present

## 2021-03-01 DIAGNOSIS — Z961 Presence of intraocular lens: Secondary | ICD-10-CM | POA: Diagnosis not present

## 2021-03-01 DIAGNOSIS — H35352 Cystoid macular degeneration, left eye: Secondary | ICD-10-CM | POA: Diagnosis not present

## 2021-03-01 DIAGNOSIS — H34811 Central retinal vein occlusion, right eye, with macular edema: Secondary | ICD-10-CM | POA: Diagnosis not present

## 2021-03-01 DIAGNOSIS — H35372 Puckering of macula, left eye: Secondary | ICD-10-CM | POA: Diagnosis not present

## 2021-03-08 DIAGNOSIS — Z9181 History of falling: Secondary | ICD-10-CM | POA: Diagnosis not present

## 2021-03-08 DIAGNOSIS — R42 Dizziness and giddiness: Secondary | ICD-10-CM | POA: Diagnosis not present

## 2021-03-09 DIAGNOSIS — R42 Dizziness and giddiness: Secondary | ICD-10-CM | POA: Diagnosis not present

## 2021-03-16 ENCOUNTER — Ambulatory Visit (HOSPITAL_COMMUNITY)
Admission: RE | Admit: 2021-03-16 | Discharge: 2021-03-16 | Disposition: A | Discharge status: Home / Self Care | Payer: Medicare HMO | Source: Ambulatory Visit | Attending: Cardiovascular Disease | Admitting: Cardiovascular Disease

## 2021-03-16 ENCOUNTER — Other Ambulatory Visit: Payer: Self-pay

## 2021-03-16 DIAGNOSIS — Z9989 Dependence on other enabling machines and devices: Secondary | ICD-10-CM | POA: Diagnosis not present

## 2021-03-16 DIAGNOSIS — I480 Paroxysmal atrial fibrillation: Secondary | ICD-10-CM | POA: Insufficient documentation

## 2021-03-16 DIAGNOSIS — I25810 Atherosclerosis of coronary artery bypass graft(s) without angina pectoris: Secondary | ICD-10-CM | POA: Insufficient documentation

## 2021-03-16 DIAGNOSIS — I739 Peripheral vascular disease, unspecified: Secondary | ICD-10-CM | POA: Diagnosis not present

## 2021-03-16 DIAGNOSIS — I6523 Occlusion and stenosis of bilateral carotid arteries: Secondary | ICD-10-CM | POA: Diagnosis not present

## 2021-03-16 DIAGNOSIS — E785 Hyperlipidemia, unspecified: Secondary | ICD-10-CM | POA: Insufficient documentation

## 2021-03-16 DIAGNOSIS — G4733 Obstructive sleep apnea (adult) (pediatric): Secondary | ICD-10-CM | POA: Insufficient documentation

## 2021-03-16 DIAGNOSIS — E1169 Type 2 diabetes mellitus with other specified complication: Secondary | ICD-10-CM | POA: Insufficient documentation

## 2021-03-18 ENCOUNTER — Encounter: Payer: Self-pay | Admitting: Physical Therapy

## 2021-03-18 ENCOUNTER — Other Ambulatory Visit: Payer: Self-pay

## 2021-03-18 ENCOUNTER — Ambulatory Visit: Payer: Medicare HMO | Admitting: Physical Therapy

## 2021-03-18 DIAGNOSIS — R2689 Other abnormalities of gait and mobility: Secondary | ICD-10-CM

## 2021-03-18 DIAGNOSIS — R42 Dizziness and giddiness: Secondary | ICD-10-CM | POA: Diagnosis not present

## 2021-03-18 NOTE — Therapy (Signed)
[facility] [place], [place], [address] Phone: [phone]   Fax:  [phone]  Physical Therapy Evaluation  Patient Details  Name: Robert Burns MRN: [id number] Date of Birth: 1940-04-15 Referring Provider (PT): Will Bonnet   Encounter Date: 03/18/2021   PT End of Session - 03/18/21 1153     Visit Number 1    Number of Visits 12    Date for PT Re-Evaluation 04/29/21    Authorization - Visit Number 1    Progress Note Due on Visit 10    PT Start Time 0845    PT Stop Time 0930    PT Time Calculation (min) 45 min    Activity Tolerance Patient tolerated treatment well    Behavior During Therapy [facility] for tasks assessed/performed             Past Medical History:  Diagnosis Date   Arthritis    "all over" (11/25/2015)   CAD (coronary artery disease)    a. s/p CABG  06/2002; b. 02/01/15 PCI: DES to prox SVG to PDA, staged PCI of SVG to Diag in 02/2015; c. 04/2017 Cath/PCI: LM nl, LAD 100ost, 8m, 75d, LCX 60ost, OM2 80, RCA 100ost, RPDA 80, LIMA->LAD ok, VG->D1 patent stent, VG->RPDA patent stent, 50p, VG->OM1->OM2 90p (3.0x24 Synergy DES), 100 between OM1->OM2 (med rx).   Cancer [facility])    Right Shoulder, Left Leg- BCC, SCC, AND MELANOMA   Epithelioid hemangioendothelioma    s/p resection of left SFA/mass with interposition 6 mm GoreTex graft 06/02/10, s/p repeat resection for positive margins 09/22/10   Hyperlipidemia    Hypertension    Leg pain    OSA on CPAP    PAD (peripheral artery disease) ([place])    a. 10/2002 L SFA PTA/BMS; b. 8/17 LE Angio: LEIA 90 (9x40 self exp stent), LSFA short segment prox occlusion (staged PTA/stenting 01/03/2016), patent mid stent, RSFA 53m (staged PTA/DEB 02/14/2016).   Presence of permanent cardiac pacemaker    Medtronic   SSS (sick sinus syndrome) ([place])    a. s/p PPM in 2007 with gen change 04/2015 - Medtronic Adapta ADDRL1, ser # [id number].   Type II diabetes mellitus  (HCC)    Type II   Urgency of urination     Past Surgical History:  Procedure Laterality Date   CARDIAC CATHETERIZATION N/A 02/01/2015   Procedure: Left Heart Cath and Coronary Angiography;  Surgeon: Lorretta Harp, MD;  Location: [place] [facility];  Service: Cardiovascular;  Laterality: N/A;   CARDIAC CATHETERIZATION N/A 02/01/2015   Procedure: Coronary Stent Intervention;  Surgeon: Lorretta Harp, MD;  Location: [place] [facility];  Service: Cardiovascular;  Laterality: N/A;   CARDIAC CATHETERIZATION  06/2002   "just before bypass OR"   CARDIAC CATHETERIZATION N/A 03/01/2015   Procedure: Coronary Stent Intervention;  Surgeon: Lorretta Harp, MD;  Location: [place] [facility];  Service: Cardiovascular;  Laterality: N/A;   CARDIAC CATHETERIZATION  02/06/2018   COLONOSCOPY     CORONARY ANGIOPLASTY     CORONARY ARTERY BYPASS GRAFT  06/2002   x5, LIMA-LAD;VG- Diag; seq VG- ramus & OM branch; VG-PDA   CORONARY STENT INTERVENTION N/A 04/12/2017   Procedure: CORONARY STENT INTERVENTION;  Surgeon: Lorretta Harp, MD;  Location: [place] [facility];  Service: Cardiovascular;  Laterality: N/A;   CORONARY STENT INTERVENTION N/A 02/08/2018   Procedure: CORONARY STENT INTERVENTION;  Surgeon: Jettie Booze, MD;  Location: [place] [facility];  Service:  Cardiovascular;  Laterality: N/A;   CORONARY STENT INTERVENTION N/A 11/12/2018   Procedure: CORONARY STENT INTERVENTION;  Surgeon: Wellington Hampshire, MD;  Location: [place] [facility];  Service: Cardiovascular;  Laterality: N/A;   CORONARY STENT INTERVENTION N/A 06/22/2020   Procedure: CORONARY STENT INTERVENTION;  Surgeon: Jettie Booze, MD;  Location: [place] [facility];  Service: Cardiovascular;  Laterality: N/A;   EP IMPLANTABLE DEVICE N/A 04/23/2015   Procedure: PPM Generator Changeout;  Surgeon: Sanda Klein, MD;  Location: [place] [facility];  Service: Cardiovascular;  Laterality: N/A;   FALSE ANEURYSM REPAIR Left 11/29/2018    Procedure: REPAIR FALSE ANEURYSM LEFT RADIAL ARTERY;  Surgeon: [name], MD;  Location: [place];  Service: Vascular;  Laterality: Left;   FEMORAL ARTERY STENT Left ~ 2014   "taken out of my leg; couldn' catorgorize what kind so the put it under all 3"; cataroziepitheloid hemanioendotheliomau   FOOT FRACTURE SURGERY Left [place] / REPLACE / REMOVE PACEMAKER  10/13/05   right side, medtronic Adapta   KNEE HARDWARE REMOVAL Right 1950's   "3-4 months after the insertion"   KNEE SURGERY Right 1950's   "broke my lower leg; had to put pin in my knee to keep lower leg in place til it healed"   LEFT HEART CATH AND CORS/GRAFTS ANGIOGRAPHY N/A 04/12/2017   Procedure: LEFT HEART CATH AND CORS/GRAFTS ANGIOGRAPHY;  Surgeon: Lorretta Harp, MD;  Location: [place] [facility];  Service: Cardiovascular;  Laterality: N/A;   LEFT HEART CATH AND CORS/GRAFTS ANGIOGRAPHY N/A 02/06/2018   Procedure: LEFT HEART CATH AND CORS/GRAFTS ANGIOGRAPHY;  Surgeon: Troy Sine, MD;  Location: [place] [facility];  Service: Cardiovascular;  Laterality: N/A;   LEFT HEART CATH AND CORS/GRAFTS ANGIOGRAPHY N/A 11/12/2018   Procedure: LEFT HEART CATH AND CORS/GRAFTS ANGIOGRAPHY;  Surgeon: Wellington Hampshire, MD;  Location: [place] [facility];  Service: Cardiovascular;  Laterality: N/A;   LEFT HEART CATH AND CORS/GRAFTS ANGIOGRAPHY N/A 06/22/2020   Procedure: LEFT HEART CATH AND CORS/GRAFTS ANGIOGRAPHY;  Surgeon: Jettie Booze, MD;  Location: [place] [facility];  Service: Cardiovascular;  Laterality: N/A;   PERIPHERAL VASCULAR CATHETERIZATION N/A 11/25/2015   Procedure: Lower Extremity Angiography;  Surgeon: Lorretta Harp, MD;  Location: [place] [facility];  Service: Cardiovascular;  Laterality: N/A;   PERIPHERAL VASCULAR CATHETERIZATION Left 11/25/2015   Procedure: Peripheral Vascular Intervention;  Surgeon: Lorretta Harp, MD;  Location: [place] [facility];  Service: Cardiovascular;  Laterality:  Left;  external iliac   PERIPHERAL VASCULAR CATHETERIZATION N/A 01/03/2016   Procedure: Lower Extremity Angiography;  Surgeon: Lorretta Harp, MD;  Location: [place] [facility];  Service: Cardiovascular;  Laterality: N/A;   PERIPHERAL VASCULAR CATHETERIZATION Left 01/03/2016   Procedure: Peripheral Vascular Intervention;  Surgeon: Lorretta Harp, MD;  Location: [place] [facility];  Service: Cardiovascular;  Laterality: Left;  SFA   PERIPHERAL VASCULAR CATHETERIZATION Right 02/14/2016   Procedure: Peripheral Vascular Atherectomy;  Surgeon: Lorretta Harp, MD;  Location: [place] [facility];  Service: Cardiovascular;  Laterality: Right;  SFA   POPLITEAL ARTERY STENT  01/03/2016   Contralateral access with a 7 [place] crossover sheath (second order catheter placement)   TONSILLECTOMY AND ADENOIDECTOMY     TUMOR EXCISION Right ~ 2005   cancerous tumor removed from shoulder   TUMOR EXCISION Right ~ 2000   benign tumor removed from under shoulder   TUMOR EXCISION  Left 06/02/2010   resection of Lt SFA wth interposition of Gore-Tex graft    There were no vitals filed for this visit.    Subjective Assessment - 03/18/21 0844     Subjective Pt reports this is his "second bout" with vertigo/dizziness. The first time it was more "annoying" but the most recent time (the past 6 weeks) has been more severe. Pt is dizzy when getting out of bed, laying down in bed and when sitting up from reclining in his recliner. DIzziness lasts 1-2 minutes or less. Pt also reports he has fallen twice in the past 2 months tripping over speed bumps at [employer].    Patient Stated Goals decrease dizziness and improve balance    Currently in Pain? No/denies                [facility] PT Assessment - 03/18/21 0001       Assessment   Medical Diagnosis dizziness and giddiness    Referring Provider (PT) Will Bonnet    Onset Date/Surgical Date 02/04/21    Next MD Visit PRN    Prior Therapy yes after heart surgery       Precautions   Precautions Fall      Balance Screen   Has the patient fallen in the past 6 months Yes    How many times? 2    Has the patient had a decrease in activity level because of a fear of falling?  No    Is the patient reluctant to leave their home because of a fear of falling?  No      Prior Function   Level of Independence Independent      Functional Tests   Functional tests Single leg stance      Single Leg Stance   Comments < 3 sec bilat      Transfers   Five time sit to stand comments  14.7 sec      Balance   Balance Assessed --   tandem stance <10 sec bilat, needs help to attain position                   Vestibular Assessment - 03/18/21 0001       Symptom Behavior   Subjective history of current problem dizziness for past 6 months with getting in/out of bed    Type of Dizziness  Spinning    Duration of Dizziness minutes or less    Symptom Nature Positional    Aggravating Factors Supine to sit;Lying supine    Relieving Factors Rest    Progression of Symptoms No change since onset      Oculomotor Exam   Spontaneous Absent    Gaze-induced  Age appropriate nystagmus at end range    Head shaking Horizontal Absent    Head Shaking Vertical Absent    Smooth Pursuits Intact    Saccades Intact    Comment Pt states he is seeing an optomitrist due to vascular condition in Rt eye and pressure behind Lt eye      Vestibulo-Ocular Reflex   VOR 1 Head Only (x 1 viewing) mild vestibular hypofunction noted but no symptoms      Positional Testing   Dix-Hallpike Dix-Hallpike Right;Dix-Hallpike Left    Horizontal Canal Testing Horizontal Canal Right;Horizontal Canal Left      Dix-Hallpike Right   Dix-Hallpike Right Duration no symptoms    Dix-Hallpike Right Symptoms No nystagmus      Dix-Hallpike Left   Dix-Hallpike Left Duration  no symptoms    Dix-Hallpike Left Symptoms No nystagmus      Horizontal Canal Right   Horizontal Canal Right Duration no  symptoms    Horizontal Canal Right Symptoms Normal      Horizontal Canal Left   Horizontal Canal Left Duration no symptoms    Horizontal Canal Left Symptoms Normal      Positional Sensitivities   Sit to Supine No dizziness    Supine to Sitting Mild dizziness                Objective measurements completed on examination: See above findings.       [facility] Adult PT Treatment/Exercise - 03/18/21 0001       Bed Mobility   Bed Mobility Supine to Sit    Supine to Sit --   habituation training with multiple pillows                Balance Exercises - 03/18/21 0001       Balance Exercises: Standing   Tandem Stance Eyes open;Upper extremity support 1;30 secs    SLS Eyes open;Intermittent upper extremity support;20 secs                PT Education - 03/18/21 6568     Education Details HEP, PT POC and goals    Person(s) Educated Patient    Methods Explanation;Demonstration;Handout    Comprehension Returned demonstration;Verbalized understanding                 PT Long Term Goals - 03/18/21 1202       PT LONG TERM GOAL #1   Title Pt will be independent with HEP    Time 6    Period Weeks    Status New    Target Date 04/29/21      PT LONG TERM GOAL #2   Title Pt will consistently perform supine <> sit with dizziness less than 1/10    Time 6    Period Weeks    Status New    Target Date 04/29/21      PT LONG TERM GOAL #3   Title Pt will perform SLS for >=10 sec bilat to demo improved balance and decreased risk of falls    Time 6    Period Weeks    Status New    Target Date 04/29/21                    Plan - 03/18/21 1155     Clinical Impression Statement Pt is an 80 y/o male referred for dizziness. Pt presents with vestibular hypofunction, impaired balance and increased fall risk. Pt will benefit from skilled PT to address deficits and improve functional mobility and safety to decrease risk of falls    Personal Factors and  Comorbidities Comorbidity 3+;Age    Examination-Activity Limitations Bed Mobility;Locomotion Level    Examination-Participation Restrictions Community Activity    Stability/Clinical Decision Making Evolving/Moderate complexity    Clinical Decision Making Moderate    Rehab Potential Good    PT Frequency 2x / week    PT Duration 6 weeks    PT Treatment/Interventions Vasopneumatic Device;Vestibular;Taping;Patient/family education;Manual techniques;Neuromuscular re-education;Balance training;Therapeutic exercise;Therapeutic activities;Gait training;Cryotherapy;Canalith Repostioning;Moist Heat;DME Instruction    PT Next Visit Plan perform BERG balance test or FGA. progress habituation and balance    PT Home Exercise Plan [id number]             Patient will benefit from skilled therapeutic intervention in order to  improve the following deficits and impairments:  Dizziness, Decreased balance, Difficulty walking  Visit Diagnosis: Dizziness and giddiness - Plan: PT plan of care cert/re-cert  Balance disorder - Plan: PT plan of care cert/re-cert     Problem List Patient Active Problem List   Diagnosis Date Noted   Hematoma of right hip 01/10/2021   Primary osteoarthritis of right elbow 01/10/2021   Chronic anticoagulation 08/12/2019   Dyslipidemia (high LDL; low HDL) 03/05/2019   Unstable angina (HCC) 11/11/2018   Hypercholesterolemia 02/20/2018   Paroxysmal atrial fibrillation ([place]) 02/20/2018   CAD (coronary artery disease) of bypass graft 02/06/2018   CAD, multiple vessel 02/06/2018   Leukocytosis 02/16/2016   Peripheral arterial disease ([place])    Pacemaker 04/23/2015   Positive cardiac stress test    OSA on CPAP 11/04/2012   SSS (sick sinus syndrome), medtronic adapta    Hyperlipidemia due to type 2 diabetes mellitus ([place])    Superficial femoral artery injury 05/09/2011   SPRAIN&STRAIN OTHER SPECIFIED SITES KNEE&LEG 01/31/2010   Type 2 diabetes mellitus with complication,  without long-term current use of insulin ([place]) 11/26/2006   Essential hypertension 11/26/2006    Mekia Dipinto, PT 03/18/2021, 12:08 PM  [facility] [place] [address] [place], [place], [address] Phone: [phone]   Fax:  [phone]  Name: POOKELA SELLIN MRN: [id number] Date of Birth: [birth year]

## 2021-03-18 NOTE — Patient Instructions (Signed)
Access Code: [email or web address] URL: [email or web address] Date: 03/18/2021 Prepared by: Isabelle Course  Exercises Supine to Long Sitting Vestibular Habituation - 1 x daily - 7 x weekly - 3 sets - 5 reps - 1 minute or until symptoms resolve hold Standing Tandem Balance with Counter Support - 1 x daily - 7 x weekly - 1 sets - 10 reps - 20-30 seconds hold Standing Single Leg Stance with Counter Support - 1 x daily - 7 x weekly - 1 sets - 10 reps - 20-30 seconds hold

## 2021-03-22 DIAGNOSIS — H34811 Central retinal vein occlusion, right eye, with macular edema: Secondary | ICD-10-CM | POA: Diagnosis not present

## 2021-03-22 DIAGNOSIS — E113512 Type 2 diabetes mellitus with proliferative diabetic retinopathy with macular edema, left eye: Secondary | ICD-10-CM | POA: Diagnosis not present

## 2021-03-22 DIAGNOSIS — H35352 Cystoid macular degeneration, left eye: Secondary | ICD-10-CM | POA: Diagnosis not present

## 2021-03-22 DIAGNOSIS — H35372 Puckering of macula, left eye: Secondary | ICD-10-CM | POA: Diagnosis not present

## 2021-03-24 ENCOUNTER — Other Ambulatory Visit: Payer: Self-pay

## 2021-03-24 ENCOUNTER — Ambulatory Visit (INDEPENDENT_AMBULATORY_CARE_PROVIDER_SITE_OTHER): Payer: Medicare HMO | Admitting: Physical Therapy

## 2021-03-24 DIAGNOSIS — R2689 Other abnormalities of gait and mobility: Secondary | ICD-10-CM

## 2021-03-24 DIAGNOSIS — R42 Dizziness and giddiness: Secondary | ICD-10-CM | POA: Diagnosis not present

## 2021-03-24 NOTE — Therapy (Signed)
[place] [facility] [place], [place], [address] Phone: [phone]   Fax:  [phone]  Physical Therapy Treatment  Patient Details  Name: Robert Burns MRN: [id number] Date of Birth: [birth year] Referring Provider (PT): Will Bonnet   Encounter Date: 03/24/2021   PT End of Session - 03/24/21 1619     Visit Number 2    Number of Visits 12    Date for PT Re-Evaluation 04/29/21    Authorization - Visit Number 2    Progress Note Due on Visit 10    PT Start Time 1617    PT Stop Time 1700    PT Time Calculation (min) 43 min    Activity Tolerance Patient tolerated treatment well    Behavior During Therapy [facility] for tasks assessed/performed             Past Medical History:  Diagnosis Date   Arthritis    "all over" (11/25/2015)   CAD (coronary artery disease)    a. s/p CABG  06/2002; b. 02/01/15 PCI: DES to prox SVG to PDA, staged PCI of SVG to Diag in 02/2015; c. 04/2017 Cath/PCI: LM nl, LAD 100ost, 48m, 75d, LCX 60ost, OM2 80, RCA 100ost, RPDA 80, LIMA->LAD ok, VG->D1 patent stent, VG->RPDA patent stent, 50p, VG->OM1->OM2 90p (3.0x24 Synergy DES), 100 between OM1->OM2 (med rx).   [facility])    Right Shoulder, Left Leg- BCC, SCC, AND MELANOMA   Epithelioid hemangioendothelioma    s/p resection of left SFA/mass with interposition 6 mm GoreTex graft 06/02/10, s/p repeat resection for positive margins 09/22/10   Hyperlipidemia    Hypertension    Leg pain    OSA on CPAP    PAD (peripheral artery disease) ([place])    a. 10/2002 L SFA PTA/BMS; b. 8/17 LE Angio: LEIA 90 (9x40 self exp stent), LSFA short segment prox occlusion (staged PTA/stenting 01/03/2016), patent mid stent, RSFA 87m (staged PTA/DEB 02/14/2016).   Presence of permanent cardiac pacemaker    Medtronic   SSS (sick sinus syndrome) ([place])    a. s/p PPM in 2007 with gen change 04/2015 - Medtronic Adapta ADDRL1, ser # [id number].   Type II diabetes mellitus  (HCC)    Type II   Urgency of urination     Past Surgical History:  Procedure Laterality Date   CARDIAC CATHETERIZATION N/A 02/01/2015   Procedure: Left Heart Cath and Coronary Angiography;  Surgeon: Lorretta Harp, MD;  Location: [place] [facility];  Service: Cardiovascular;  Laterality: N/A;   CARDIAC CATHETERIZATION N/A 02/01/2015   Procedure: Coronary Stent Intervention;  Surgeon: Lorretta Harp, MD;  Location: [place] [facility];  Service: Cardiovascular;  Laterality: N/A;   CARDIAC CATHETERIZATION  06/2002   "just before bypass OR"   CARDIAC CATHETERIZATION N/A 03/01/2015   Procedure: Coronary Stent Intervention;  Surgeon: Lorretta Harp, MD;  Location: [place] [facility];  Service: Cardiovascular;  Laterality: N/A;   CARDIAC CATHETERIZATION  02/06/2018   COLONOSCOPY     CORONARY ANGIOPLASTY     CORONARY ARTERY BYPASS GRAFT  06/2002   x5, LIMA-LAD;VG- Diag; seq VG- ramus & OM branch; VG-PDA   CORONARY STENT INTERVENTION N/A 04/12/2017   Procedure: CORONARY STENT INTERVENTION;  Surgeon: Lorretta Harp, MD;  Location: [place] [facility];  Service: Cardiovascular;  Laterality: N/A;   CORONARY STENT INTERVENTION N/A 02/08/2018   Procedure: CORONARY STENT INTERVENTION;  Surgeon: Jettie Booze, MD;  Location: [place] [facility];  Service:  Cardiovascular;  Laterality: N/A;   CORONARY STENT INTERVENTION N/A 11/12/2018   Procedure: CORONARY STENT INTERVENTION;  Surgeon: Wellington Hampshire, MD;  Location: [place] [facility];  Service: Cardiovascular;  Laterality: N/A;   CORONARY STENT INTERVENTION N/A 06/22/2020   Procedure: CORONARY STENT INTERVENTION;  Surgeon: Jettie Booze, MD;  Location: [place] [facility];  Service: Cardiovascular;  Laterality: N/A;   EP IMPLANTABLE DEVICE N/A 04/23/2015   Procedure: PPM Generator Changeout;  Surgeon: Sanda Klein, MD;  Location: [place] [facility];  Service: Cardiovascular;  Laterality: N/A;   FALSE ANEURYSM REPAIR Left 11/29/2018    Procedure: REPAIR FALSE ANEURYSM LEFT RADIAL ARTERY;  Surgeon: [name], MD;  Location: [place];  Service: Vascular;  Laterality: Left;   FEMORAL ARTERY STENT Left ~ 2014   "taken out of my leg; couldn' catorgorize what kind so the put it under all 3"; cataroziepitheloid hemanioendotheliomau   FOOT FRACTURE SURGERY Left [place] / REPLACE / REMOVE PACEMAKER  10/13/05   right side, medtronic Adapta   KNEE HARDWARE REMOVAL Right 1950's   "3-4 months after the insertion"   KNEE SURGERY Right 1950's   "broke my lower leg; had to put pin in my knee to keep lower leg in place til it healed"   LEFT HEART CATH AND CORS/GRAFTS ANGIOGRAPHY N/A 04/12/2017   Procedure: LEFT HEART CATH AND CORS/GRAFTS ANGIOGRAPHY;  Surgeon: Lorretta Harp, MD;  Location: [place] [facility];  Service: Cardiovascular;  Laterality: N/A;   LEFT HEART CATH AND CORS/GRAFTS ANGIOGRAPHY N/A 02/06/2018   Procedure: LEFT HEART CATH AND CORS/GRAFTS ANGIOGRAPHY;  Surgeon: Troy Sine, MD;  Location: [place] [facility];  Service: Cardiovascular;  Laterality: N/A;   LEFT HEART CATH AND CORS/GRAFTS ANGIOGRAPHY N/A 11/12/2018   Procedure: LEFT HEART CATH AND CORS/GRAFTS ANGIOGRAPHY;  Surgeon: Wellington Hampshire, MD;  Location: [place] [facility];  Service: Cardiovascular;  Laterality: N/A;   LEFT HEART CATH AND CORS/GRAFTS ANGIOGRAPHY N/A 06/22/2020   Procedure: LEFT HEART CATH AND CORS/GRAFTS ANGIOGRAPHY;  Surgeon: Jettie Booze, MD;  Location: [place] [facility];  Service: Cardiovascular;  Laterality: N/A;   PERIPHERAL VASCULAR CATHETERIZATION N/A 11/25/2015   Procedure: Lower Extremity Angiography;  Surgeon: Lorretta Harp, MD;  Location: [place] [facility];  Service: Cardiovascular;  Laterality: N/A;   PERIPHERAL VASCULAR CATHETERIZATION Left 11/25/2015   Procedure: Peripheral Vascular Intervention;  Surgeon: Lorretta Harp, MD;  Location: [place] [facility];  Service: Cardiovascular;  Laterality:  Left;  external iliac   PERIPHERAL VASCULAR CATHETERIZATION N/A 01/03/2016   Procedure: Lower Extremity Angiography;  Surgeon: Lorretta Harp, MD;  Location: [place] [facility];  Service: Cardiovascular;  Laterality: N/A;   PERIPHERAL VASCULAR CATHETERIZATION Left 01/03/2016   Procedure: Peripheral Vascular Intervention;  Surgeon: Lorretta Harp, MD;  Location: [place] [facility];  Service: Cardiovascular;  Laterality: Left;  SFA   PERIPHERAL VASCULAR CATHETERIZATION Right 02/14/2016   Procedure: Peripheral Vascular Atherectomy;  Surgeon: Lorretta Harp, MD;  Location: [place] [facility];  Service: Cardiovascular;  Laterality: Right;  SFA   POPLITEAL ARTERY STENT  01/03/2016   Contralateral access with a 7 [place] crossover sheath (second order catheter placement)   TONSILLECTOMY AND ADENOIDECTOMY     TUMOR EXCISION Right ~ 2005   cancerous tumor removed from shoulder   TUMOR EXCISION Right ~ 2000   benign tumor removed from under shoulder   TUMOR EXCISION  Left 06/02/2010   resection of Lt SFA wth interposition of Gore-Tex graft    There were no vitals filed for this visit.   Subjective Assessment - 03/24/21 1619     Subjective Pt reports a lot of dizzy spells since last visit. Mostly when getting in/out of bed and sometimes while sitting. Pt states he has not been able to practice the exercise.    Patient Stated Goals decrease dizziness and improve balance    Currently in Pain? No/denies                [facility] PT Assessment - 03/24/21 0001       Assessment   Medical Diagnosis dizziness and giddiness    Referring Provider (PT) Will Bonnet    Onset Date/Surgical Date 02/04/21      Standardized Balance Assessment   Standardized Balance Assessment Berg Balance Test      Berg Balance Test   Sit to Stand Able to stand without using hands and stabilize independently    Standing Unsupported Able to stand safely 2 minutes    Sitting with Back Unsupported but Feet Supported  on Floor or Stool Able to sit safely and securely 2 minutes    Stand to Sit Controls descent by using hands    Transfers Able to transfer safely, minor use of hands    Standing Unsupported with Eyes Closed Able to stand 10 seconds with supervision    Standing Unsupported with Feet Together Able to place feet together independently and stand for 1 minute with supervision    From Standing, Reach Forward with Outstretched Arm Can reach confidently >25 cm (10")    From Standing Position, Pick up Object from [place] to pick up shoe safely and easily    From Standing Position, Turn to Look Behind Over each Shoulder Looks behind from both sides and weight shifts well    Turn 360 Degrees Able to turn 360 degrees safely in 4 seconds or less    Standing Unsupported, Alternately Place Feet on Step/Stool Able to stand independently and safely and complete 8 steps in 20 seconds    Standing Unsupported, One Foot in Front Able to plae foot ahead of the other independently and hold 30 seconds    Standing on One Leg Able to lift leg independently and hold equal to or more than 3 seconds    Total Score 50    Berg comment: 50/56      Functional Gait  Assessment   Gait assessed  Yes    Gait Level Surface Walks 20 ft in less than 5.5 sec, no assistive devices, good speed, no evidence for imbalance, normal gait pattern, deviates no more than 6 in outside of the 12 in walkway width.    Change in Gait Speed Able to smoothly change walking speed without loss of balance or gait deviation. Deviate no more than 6 in outside of the 12 in walkway width.    Gait with Horizontal Head Turns Performs head turns smoothly with slight change in gait velocity (eg, minor disruption to smooth gait path), deviates 6-10 in outside 12 in walkway width, or uses an assistive device.    Gait with Vertical Head Turns Performs head turns with no change in gait. Deviates no more than 6 in outside 12 in walkway width.    Gait and Pivot Turn  Pivot turns safely within 3 sec and stops quickly with no loss of balance.    Step Over Obstacle Is able  to step over 2 stacked shoe boxes taped together (9 in total height) without changing gait speed. No evidence of imbalance.    Gait with Narrow Base of Support Ambulates less than 4 steps heel to toe or cannot perform without assistance.    Gait with Eyes Closed Walks 20 ft, uses assistive device, slower speed, mild gait deviations, deviates 6-10 in outside 12 in walkway width. Ambulates 20 ft in less than 9 sec but greater than 7 sec.    Ambulating Backwards Walks 20 ft, uses assistive device, slower speed, mild gait deviations, deviates 6-10 in outside 12 in walkway width.    Steps Alternating feet, no rail.    Total Score 24    FGA comment: 24/30                 Vestibular Assessment - 03/24/21 0001       Dix-Hallpike Right   Dix-Hallpike Right Duration 18 sec    Dix-Hallpike Right Symptoms Upbeat, right rotatory nystagmus                      [facility] Adult PT Treatment/Exercise - 03/24/21 0001       Exercises   Exercises Knee/Hip      Knee/Hip Exercises: Aerobic   Nustep L5 x 5 min LEs/UEs             Vestibular Treatment/Exercise - 03/24/21 0001       Vestibular Treatment/Exercise   Vestibular Treatment Provided Canalith Repositioning    Canalith Repositioning Epley Manuever Right    Habituation Exercises Nestor Lewandowsky       EPLEY MANUEVER RIGHT   Number of Reps  2    Overall Response Improved Symptoms                    PT Education - 03/24/21 1710     Education Details Discussed BPPV findings, POC, and HEP    Person(s) Educated Patient;Spouse    Methods Explanation;Demonstration;Tactile cues;Verbal cues;Handout    Comprehension Verbalized understanding;Returned demonstration;Verbal cues required;Tactile cues required                 PT Long Term Goals - 03/24/21 1710       PT LONG TERM GOAL #1   Title Pt will be  independent with HEP    Time 6    Period Weeks    Status On-going    Target Date 04/29/21      PT LONG TERM GOAL #2   Title Pt will consistently perform supine <> sit with dizziness less than 1/10    Time 6    Period Weeks    Status On-going    Target Date 04/29/21      PT LONG TERM GOAL #3   Title Pt will perform SLS for >=10 sec bilat to demo improved balance and decreased risk of falls    Time 6    Period Weeks    Status On-going    Target Date 04/29/21      PT LONG TERM GOAL #4   Title Pt will be (-) for BPPV in all testing positions x 2 weeks    Baseline (+) R posterior canal on 03/24/21    Target Date 04/29/21                   Plan - 03/24/21 1647     Clinical Impression Statement Treatment focused on reviewing pt's HEP. Checked pt's balance via  Berg Balance and FGA. Based on Berg Balance Score pt is a low fall risk. FGA demos he is a moderate fall risk. On attempt of reviewing pt's supine to long sitting habituation exercise, visible upbeating/right nystagmus seen. Rechecked pt in modified Dix-Hallpike and he was positive for R posterior canalithiasis. Provided Epley maneuver to address. Demonstrated to pt how to perform this at home. Encouraged him to work on his exercises at home.    Personal Factors and Comorbidities Comorbidity 3+;Age    Examination-Activity Limitations Bed Mobility;Locomotion Level    Examination-Participation Restrictions Community Activity    Stability/Clinical Decision Making Evolving/Moderate complexity    Rehab Potential Good    PT Frequency 2x / week    PT Duration 6 weeks    PT Treatment/Interventions Vasopneumatic Device;Vestibular;Taping;Patient/family education;Manual techniques;Neuromuscular re-education;Balance training;Therapeutic exercise;Therapeutic activities;Gait training;Cryotherapy;Canalith Repostioning;Moist Heat;DME Instruction    PT Next Visit Plan Recheck canaliths. Continue with balance, habituation and gaze  stabilization.    PT Home Exercise Plan [id number]             Patient will benefit from skilled therapeutic intervention in order to improve the following deficits and impairments:  Dizziness, Decreased balance, Difficulty walking  Visit Diagnosis: Dizziness and giddiness  Balance disorder     Problem List Patient Active Problem List   Diagnosis Date Noted   Hematoma of right hip 01/10/2021   Primary osteoarthritis of right elbow 01/10/2021   Chronic anticoagulation 08/12/2019   Dyslipidemia (high LDL; low HDL) 03/05/2019   Unstable angina (HCC) 11/11/2018   Hypercholesterolemia 02/20/2018   Paroxysmal atrial fibrillation ([place]) 02/20/2018   CAD (coronary artery disease) of bypass graft 02/06/2018   CAD, multiple vessel 02/06/2018   Leukocytosis 02/16/2016   Peripheral arterial disease ([place])    Pacemaker 04/23/2015   Positive cardiac stress test    OSA on CPAP 11/04/2012   SSS (sick sinus syndrome), medtronic adapta    Hyperlipidemia due to type 2 diabetes mellitus ([place])    Superficial femoral artery injury 05/09/2011   SPRAIN&STRAIN OTHER SPECIFIED SITES KNEE&LEG 01/31/2010   Type 2 diabetes mellitus with complication, without long-term current use of insulin ([place]) 11/26/2006   Essential hypertension 11/26/2006    [facility] April Gordy Levan, PT, DPT 03/24/2021, 5:12 PM  [facility] [address] [place], [place], [address] Phone: [phone]   Fax:  [phone]  Name: ALESSIO BOGAN MRN: [id number] Date of Birth: Aug 25, 1940

## 2021-03-25 ENCOUNTER — Encounter: Payer: Self-pay | Admitting: Cardiovascular Disease

## 2021-03-25 ENCOUNTER — Ambulatory Visit: Payer: Medicare HMO | Admitting: Cardiovascular Disease

## 2021-03-25 VITALS — BP 118/60 | HR 93 | Resp 20 | Ht 67.0 in | Wt 174.0 lb

## 2021-03-25 DIAGNOSIS — I2510 Atherosclerotic heart disease of native coronary artery without angina pectoris: Secondary | ICD-10-CM | POA: Diagnosis not present

## 2021-03-25 DIAGNOSIS — I6523 Occlusion and stenosis of bilateral carotid arteries: Secondary | ICD-10-CM

## 2021-03-25 DIAGNOSIS — E1169 Type 2 diabetes mellitus with other specified complication: Secondary | ICD-10-CM

## 2021-03-25 DIAGNOSIS — I10 Essential (primary) hypertension: Secondary | ICD-10-CM | POA: Diagnosis not present

## 2021-03-25 DIAGNOSIS — E785 Hyperlipidemia, unspecified: Secondary | ICD-10-CM | POA: Diagnosis not present

## 2021-03-25 DIAGNOSIS — I495 Sick sinus syndrome: Secondary | ICD-10-CM | POA: Diagnosis not present

## 2021-03-25 DIAGNOSIS — I739 Peripheral vascular disease, unspecified: Secondary | ICD-10-CM | POA: Diagnosis not present

## 2021-03-25 NOTE — Assessment & Plan Note (Signed)
History of PAD status post multiple lower extremity interventions principally in his SFAs.  His last intervention was 02/16/2016 of his right SFA.  He denies claudication.  His most recent lower extremity arterial Doppler studies performed 12/29/2020 revealed a right ABI 0.92 and a left of 1.11.  His SFAs and iliacs were widely patent.

## 2021-03-25 NOTE — Progress Notes (Signed)
03/25/2021 Robert Burns   [birth year]  [id number]  Primary Physician [name], MD Primary Cardiologist: Lorretta Harp MD [name], [name]  HPI:  Robert Burns is a 80 y.o.  married Caucasian male, father of 2, grandfather to 3 grandchildren whose wife Robert Burns who is also a patient of mine. I last saw him in the office 11/19/2020.Robert Burns  He is accompanied by his wife today.  He has a history of CAD status post coronary artery bypass grafting March 2004 with a LIMA to his LAD, a vein to a diagonal branch, a sequential vein to a ramus and OM branch, as well as a vein to the PDA. Last functional study performed July 28, 2011, was entirely normal. 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 He had a pacemaker placed for sick sinus syndrome November 2008 followed by Dr. Sallyanne Kuster. He has obstructive sleep apnea on CPAP. He complain of left thigh pain and I angiogram'd him revealing patent arteries though he did have a space-occupying lesion which was removed by Dr. Kellie Simmering with placement of an interposition 6-mm Gore-Tex graft. The pathology was uncertain. 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 Dr. Baxter Flattery follows his lipid profile.Since I saw him back 11/07/12 he has done well.  Followup Dopplers performed in our office 09/27/12 revealed a high-grade lesion in the distal right SFA with an ABI of 0.3. His left ABI 1.1 without obstructive disease.  His most recent lower extremity Doppler Dopplers performed 9/32/16 revealed a right ABI 0.82  And a left ABI of 0.89. Over the last 3 months he's noticed anginal chest pain occurring both at rest and with exertion with left upper extremity radiation. He also complains of left lower extremity discomfort. to moderate anterolateral ischemia. Because of ongoing chest pain and a Myoview that showed anterolateral ischemia and he underwent cardiac catheterization on 02/01/15 revealing high grade segmental proximal right SVG  disease and subtotally occluded diagonal branch SVG. He underwent stenting of his RCA SVG successfully. He does have continued chest pain although somewhat improved since his last procedure.  I brought him back for staged diagonal branch SVG intervention on 03/01/15. This was successful and since that time he denies chest pain or shortness of breath. He underwent a generator change by Dr. Sallyanne Kuster in January for end-of-life of his Medtronic pacemaker. Since I saw him 6 months ago he developed new left calf claudication of the last 3 months. Lower extremity arterial Doppler studies performed today revealed a decline in his left ABI from 0.87 down to 0.59 with an occluded left SFA that is a new finding. 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 He had a short segment occlusion of the proximal left SFA with patent mid left SFA stent. He had 90% segmental calcified mid right SFA stenosis with three-vessel runoff bilaterally. I ended up stenting his left external iliac artery with a 9 mm x 40 mm long nitinol self-expanding  stent which improved his symptoms somewhat although he continued to have lifestyle limiting claudication. He underwent staged intervention of his left SFA on 01/03/16. I stented the entire quadrant totally occluded segment with a 6 mm x 250 mm long Viabahn  Stent. His claudication on the left  has resolved and his ABIs normalized. He now complains of right leg medication. I did demonstrate a 90%  calcified segmental mid right SFA stenosis at the time of angiography 11/25/15. He underwent diamondback orbital rotation atherectomy/drug-eluting angioplasty of his mid calcified right SFA by myself 02/14/16. This follow-up Dopplers performed 02/24/16 revealed normal ABIs bilaterally. His claudication has improved. When I saw him in the office possibly 3 weeks  ago he was complaining of fairly new onset substernal chest pain over the prior several months occurring several times a week. These were new since his RCA and diagonal branch vein graft interventions at the end of 2016. He is on good antianginal medications. A Myoview stress test was ordered that showed subtle inferolateral ischemia and echo showed normal LV function.Robert Burns   He was admitted to the hospital 02/06/2018 for 3 days with unstable angina.  He underwent catheterization by Dr. Claiborne Billings on 02/06/2018 revealing disease in all 3 vein grafts as well as in the LAD beyond LIMA insertion, 2 days later he underwent complex intervention on his diagonal and ramus branch vein grafts with restenting as well as intervention on his LAD via LIMA insertion.  His RCA vein graft was not intervened on.  He currently denies angina or claudication.    He was admitted to the hospital with unstable angina on 11/11/2018.  Enzymes were negative.  He underwent catheterization and intervention by Dr. Fletcher Anon 11/12/2018 via the left radial approach with stenting of his proximal diagonal branch vein graft in his distal PDA vein graft.  Since discharge, his anginal symptoms have significantly improved although he still has a "heaviness feeling" in his chest.  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  This was subsequently surgically addressed by Dr. Donnetta Hutching.   Dr. Sallyanne Kuster did identify significant A. fib burden on remote interrogation of his pacemaker and he was subsequent begun on Eliquis which was transitioned to Xarelto.  He gets occasional chest pain not requiring sublingual nitroglycerin.  His most recent lower extremity arterial Doppler studies revealed patent left iliac and SFAs bilaterally.  Carotid Dopplers performed last December showed moderate right ICA stenosis.   He was admitted to the hospital with unstable angina 06/21/2020 and underwent diagnostic coronary angiography by Dr. Irish Lack via the femoral  approach with intervention of his ramus branch vein graft and PDA vein graft with excellent result.  Since discharge on 06/24/2020 he feels clinically improved but was feeling an episode of chest pain while raking leaves although no discomfort while doing water aerobics for 45 minutes.  He was seen by Dr. Farris Has 11/09/2020 relating some of the symptoms of chest pain.  A Myoview stress test was ordered on 11/12/2020 which was entirely normal.  At this point, he is on maximum antianginal medications without room to add or increase and given the paucity of symptoms I did not feel compelled to pursue a more invasive approach at this time.  Since I saw him 4 months ago he is remained clinically stable.  He does get occasional chest pain not requiring sublingual nitroglycerin.  His major complaint is of vertigo.  He is getting physical therapy for this.     Current Meds  Medication Sig   amLODipine (NORVASC) 10 MG tablet Take 10 mg by mouth daily.   atorvastatin (LIPITOR) 80 MG tablet Take 1 tablet (80 mg total) by mouth daily at 6 PM.   Cholecalciferol (VITAMIN D3) 2000 units capsule Take 2,000 Units by mouth 2 (two) times daily.   clopidogrel (PLAVIX) 75 MG tablet Take 1 tablet (75 mg total) by mouth  daily with breakfast.   COVID-19 mRNA bivalent vaccine, Moderna, (MODERNA COVID-19 BIVAL BOOSTER) 50 MCG/0.5ML injection Inject into the muscle.   dorzolamide-timolol (COSOPT) 22.3-6.8 MG/ML ophthalmic solution Place 1 drop into both eyes 2 (two) times daily.   glipiZIDE (GLUCOTROL) 5 MG tablet Take 5 mg by mouth daily with breakfast.    hydrALAZINE (APRESOLINE) 25 MG tablet Take 1 tablet (25 mg total) by mouth as needed (for BP >150 sys or 90 dia).   hydrochlorothiazide (HYDRODIURIL) 25 MG tablet Take 25 mg by mouth every morning.   isosorbide mononitrate (IMDUR) 60 MG 24 hr tablet Take 1 tablet (60 mg total) by mouth daily.   ketorolac (ACULAR) 0.4 % SOLN Place 1 drop into both eyes 4 (four) times daily.    melatonin 5 MG TABS Take 5 mg by mouth at bedtime.   metFORMIN (GLUCOPHAGE) 1000 MG tablet Take 1 tablet (1,000 mg total) by mouth 2 (two) times daily with a meal. Restart on 3/18.   metoprolol tartrate (LOPRESSOR) 100 MG tablet Take 1 tablet (100 mg total) by mouth 2 (two) times daily.   MODERNA COVID-19 VACCINE 100 MCG/0.5ML injection    MULTIPLE VITAMIN-FOLIC ACID PO Take 1 tablet by mouth daily.   neomycin-polymyxin-hydrocortisone (CORTISPORIN) 3.5-10000-1 OTIC suspension Place 3 drops into both ears 3 (three) times daily.   nitroGLYCERIN (NITROSTAT) 0.4 MG SL tablet Place 1 tablet (0.4 mg total) under the tongue every 5 (five) minutes as needed for chest pain.   pantoprazole (PROTONIX) 20 MG tablet Take 20 mg by mouth daily.   potassium chloride SA (K-DUR,KLOR-CON) 20 MEQ tablet Take 1 tablet (20 mEq total) by mouth daily.   ranolazine (RANEXA) 1000 MG SR tablet Take 1 tablet (1,000 mg total) by mouth 2 (two) times daily.   rivaroxaban (XARELTO) 20 MG TABS tablet Take 1 tablet (20 mg total) by mouth daily with supper.   rOPINIRole (REQUIP) 1 MG tablet Take 1 mg by mouth at bedtime.   tamsulosin (FLOMAX) 0.4 MG CAPS capsule Take 0.4 mg by mouth 2 (two) times daily.      Allergies  Allergen Reactions   Peanut-Containing Drug Products Anaphylaxis and Other (See Comments)    Tongue swelling is severe    Ace Inhibitors Hives, Rash and Cough        Diltiazem Hcl Other (See Comments)    UNSPECIFIED REACTION     Meloxicam Other (See Comments)    GI upset GI BLEED    Eliquis [Apixaban] Other (See Comments)    dizzyness   Sertraline Hcl Other (See Comments)   Carvedilol Itching   Clonidine Hcl Other (See Comments)    Patch only - skin irritation   Duloxetine Hcl Other (See Comments)    Urinary frequency    Social History   Socioeconomic History   Marital status: Married    Spouse name: Not on file   Number of children: Not on file   Years of education: Not on file    Highest education level: Not on file  Occupational History   Not on file  Tobacco Use   Smoking status: Former    Types: Pipe    Quit date: 10    Years since quitting: 49.9   Smokeless tobacco: Never   Tobacco comments:    "quit smoking in 1973"  Vaping Use   Vaping Use: Never used  Substance and Sexual Activity   Alcohol use: No   Drug use: No   Sexual activity: Not Currently  Other Topics  Concern   Not on file  Social History Narrative   Not on file   Social Determinants of Health   Financial Resource Strain: Not on file  Food Insecurity: Not on file  Transportation Needs: Not on file  Physical Activity: Not on file  Stress: Not on file  Social Connections: Not on file  Intimate Partner Violence: Not on file     Review of Systems: General: negative for chills, fever, night sweats or weight changes.  Cardiovascular: negative for chest pain, dyspnea on exertion, edema, orthopnea, palpitations, paroxysmal nocturnal dyspnea or shortness of breath Dermatological: negative for rash Respiratory: negative for cough or wheezing Urologic: negative for hematuria Abdominal: negative for nausea, vomiting, diarrhea, bright red blood per rectum, melena, or hematemesis Neurologic: negative for visual changes, syncope, or dizziness All other systems reviewed and are otherwise negative except as noted above.    Blood pressure 118/60, pulse 93, resp. rate 20, height 5\' 7"  (1.702 m), weight 174 lb (78.9 kg), SpO2 97 %.  General appearance: alert and no distress Neck: no adenopathy, no carotid bruit, no JVD, supple, symmetrical, trachea midline, and thyroid not enlarged, symmetric, no tenderness/mass/nodules Lungs: clear to auscultation bilaterally Heart: regular rate and rhythm, S1, S2 normal, no murmur, click, rub or gallop Extremities: extremities normal, atraumatic, no cyanosis or edema Pulses: 2+ and symmetric Skin: Skin color, texture, turgor normal. No rashes or  lesions Neurologic: Grossly normal  EKG not performed today  ASSESSMENT AND PLAN:   Essential hypertension History of essential hypertension a blood pressure measured today at 118/60.  He is on amlodipine, hydralazine, hydrochlorothiazide and metoprolol.  SSS (sick sinus syndrome), medtronic adapta History of sick sinus syndrome status post permanent transvenous pacemaker inserted by Dr. Sallyanne Kuster oh November 2008.  He had GEN change by Dr. Sallyanne Kuster oh for end-of-life.  He was found to have PAF on remote interrogation and was begun on Eliquis and switched to Xarelto.  Hyperlipidemia due to type 2 diabetes mellitus ([place]) History of hyperlipidemia on high-dose statin therapy with lipid profile performed 06/23/2020 revealing total cholesterol of 72, LDL 30 and HDL 28.  Peripheral arterial disease ([facility]) History of PAD status post multiple lower extremity interventions principally in his SFAs.  His last intervention was 02/16/2016 of his right SFA.  He denies claudication.  His most recent lower extremity arterial Doppler studies performed 12/29/2020 revealed a right ABI 0.92 and a left of 1.11.  His SFAs and iliacs were widely patent.  CAD, multiple vessel History of CAD status post coronary artery bypass grafting March 2004 LIMA to his LAD, vein to diagonal branch, sequential vein to ramus and OM branch and to the PDA.  He has had multiple interventions in the past most recently by Dr. Irish Lack 06/21/2020.  Intervene on the ramus branch and PDA vein grafts.  He did have a Myoview performed 11/09/2020 ordered by Dr. Audie Box  that was low risk and nonischemic.  He does get occasional chest pain and has not required supplemental glycerin.  He is on maximal antianginal medications including high-dose long-acting nitrates, beta-blocker and ranolazine.     Lorretta Harp MD FACP,FACC,FAHA, [facility] 03/25/2021 1:53 PM

## 2021-03-25 NOTE — Assessment & Plan Note (Signed)
History of hyperlipidemia on high-dose statin therapy with lipid profile performed 06/23/2020 revealing total cholesterol of 72, LDL 30 and HDL 28.

## 2021-03-25 NOTE — Assessment & Plan Note (Signed)
History of CAD status post coronary artery bypass grafting March 2004 LIMA to his LAD, vein to diagonal branch, sequential vein to ramus and OM branch and to the PDA.  He has had multiple interventions in the past most recently by Dr. Irish Lack 06/21/2020.  Intervene on the ramus branch and PDA vein grafts.  He did have a Myoview performed 11/09/2020 ordered by Dr. Audie Box  that was low risk and nonischemic.  He does get occasional chest pain and has not required supplemental glycerin.  He is on maximal antianginal medications including high-dose long-acting nitrates, beta-blocker and ranolazine.

## 2021-03-25 NOTE — Patient Instructions (Signed)
Medication Instructions:  Your physician recommends that you continue on your current medications as directed. Please refer to the Current Medication list given to you today.  *If you need a refill on your cardiac medications before your next appointment, please call your pharmacy*   Testing/Procedures: Dr. Gwenlyn Found has recommended that you have an Ultrasound of your AORTA/IVC/ILIACS.   To prepare for this test:  No food after 11PM the night before. Water is OK. (Don't drink liquids if you have been instructed not to for ANOTHER test).  Avoid foods that produce bowel gas, for 24 hours prior to exam (see below). No breakfast, no chewing gum, no smoking or carbonated beverages. Patient may take morning medications with water. Come in for test at least 15 minutes early to register.  Your physician has requested that you have a lower extremity arterial duplex. This test is an ultrasound of the arteries in the legs. It looks at arterial blood flow in the legs. Allow one hour for Lower Arterial scans. There are no restrictions or special instructions  Your physician has requested that you have an ankle brachial index (ABI). During this test an ultrasound and blood pressure cuff are used to evaluate the arteries that supply the arms and legs with blood. Allow thirty minutes for this exam. There are no restrictions or special instructions.  To be done in September 2023. These procedures are done at [facility].   Your physician has requested that you have a carotid duplex. This test is an ultrasound of the carotid arteries in your neck. It looks at blood flow through these arteries that supply the brain with blood. Allow one hour for this exam. There are no restrictions or special instructions.  To be done in December 2023. This procedure is done at [facility].   Follow-Up: At [facility], you and your health needs are our priority.  As part of our continuing mission to provide you  with exceptional heart care, we have created designated Provider Care Teams.  These Care Teams include your primary Cardiologist (physician) and Advanced Practice Providers (APPs -  Physician Assistants and Nurse Practitioners) who all work together to provide you with the care you need, when you need it.  We recommend signing up for the patient portal called "MyChart".  Sign up information is provided on this After Visit Summary.  MyChart is used to connect with patients for Virtual Visits (Telemedicine).  Patients are able to view lab/test results, encounter notes, upcoming appointments, etc.  Non-urgent messages can be sent to your provider as well.   To learn more about what you can do with MyChart, go to [email or web address].    Your next appointment:   6 month(s)  The format for your next appointment:   In Person  Provider:   Coletta Memos, FNP, Fabian Sharp, PA-C, Sande Rives, PA-C, Caron Presume, PA-C, Jory Sims, DNP, ANP, or Almyra Deforest, PA-C      Then, Quay Burow, MD will plan to see you again in 12 month(s).

## 2021-03-25 NOTE — Assessment & Plan Note (Signed)
History of sick sinus syndrome status post permanent transvenous pacemaker inserted by Dr. Sallyanne Kuster oh November 2008.  He had GEN change by Dr. Sallyanne Kuster oh for end-of-life.  He was found to have PAF on remote interrogation and was begun on Eliquis and switched to Xarelto.

## 2021-03-25 NOTE — Assessment & Plan Note (Signed)
History of essential hypertension a blood pressure measured today at 118/60.  He is on amlodipine, hydralazine, hydrochlorothiazide and metoprolol.

## 2021-03-31 ENCOUNTER — Ambulatory Visit: Payer: Medicare HMO | Admitting: Physical Therapy

## 2021-03-31 ENCOUNTER — Other Ambulatory Visit: Payer: Self-pay

## 2021-03-31 DIAGNOSIS — R42 Dizziness and giddiness: Secondary | ICD-10-CM | POA: Diagnosis not present

## 2021-03-31 DIAGNOSIS — R2689 Other abnormalities of gait and mobility: Secondary | ICD-10-CM | POA: Diagnosis not present

## 2021-03-31 NOTE — Therapy (Signed)
[facility] [place], [place], [address] Phone: [phone]   Fax:  [phone]  Physical Therapy Treatment  Patient Details  Name: Robert Burns MRN: [id number] Date of Birth: Feb 13, 1941 Referring Provider (PT): Will Bonnet   Encounter Date: 03/31/2021   PT End of Session - 03/31/21 1747     Visit Number 3    Number of Visits 12    Date for PT Re-Evaluation 04/29/21    Authorization - Visit Number 3    Progress Note Due on Visit 10    PT Start Time 1624    PT Stop Time 1705    PT Time Calculation (min) 41 min    Activity Tolerance Patient tolerated treatment well    Behavior During Therapy [facility] for tasks assessed/performed             Past Medical History:  Diagnosis Date   Arthritis    "all over" (11/25/2015)   CAD (coronary artery disease)    a. s/p CABG  06/2002; b. 02/01/15 PCI: DES to prox SVG to PDA, staged PCI of SVG to Diag in 02/2015; c. 04/2017 Cath/PCI: LM nl, LAD 100ost, 82m, 75d, LCX 60ost, OM2 80, RCA 100ost, RPDA 80, LIMA->LAD ok, VG->D1 patent stent, VG->RPDA patent stent, 50p, VG->OM1->OM2 90p (3.0x24 Synergy DES), 100 between OM1->OM2 (med rx).   Cancer [facility])    Right Shoulder, Left Leg- BCC, SCC, AND MELANOMA   Epithelioid hemangioendothelioma    s/p resection of left SFA/mass with interposition 6 mm GoreTex graft 06/02/10, s/p repeat resection for positive margins 09/22/10   Hyperlipidemia    Hypertension    Leg pain    OSA on CPAP    PAD (peripheral artery disease) ([place])    a. 10/2002 L SFA PTA/BMS; b. 8/17 LE Angio: LEIA 90 (9x40 self exp stent), LSFA short segment prox occlusion (staged PTA/stenting 01/03/2016), patent mid stent, RSFA 66m (staged PTA/DEB 02/14/2016).   Presence of permanent cardiac pacemaker    Medtronic   SSS (sick sinus syndrome) ([place])    a. s/p PPM in 2007 with gen change 04/2015 - Medtronic Adapta ADDRL1, ser # [id number].   Type II diabetes mellitus  (HCC)    Type II   Urgency of urination     Past Surgical History:  Procedure Laterality Date   CARDIAC CATHETERIZATION N/A 02/01/2015   Procedure: Left Heart Cath and Coronary Angiography;  Surgeon: Lorretta Harp, MD;  Location: [place] [facility];  Service: Cardiovascular;  Laterality: N/A;   CARDIAC CATHETERIZATION N/A 02/01/2015   Procedure: Coronary Stent Intervention;  Surgeon: Lorretta Harp, MD;  Location: [place] [facility];  Service: Cardiovascular;  Laterality: N/A;   CARDIAC CATHETERIZATION  06/2002   "just before bypass OR"   CARDIAC CATHETERIZATION N/A 03/01/2015   Procedure: Coronary Stent Intervention;  Surgeon: Lorretta Harp, MD;  Location: [place] [facility];  Service: Cardiovascular;  Laterality: N/A;   CARDIAC CATHETERIZATION  02/06/2018   COLONOSCOPY     CORONARY ANGIOPLASTY     CORONARY ARTERY BYPASS GRAFT  06/2002   x5, LIMA-LAD;VG- Diag; seq VG- ramus & OM branch; VG-PDA   CORONARY STENT INTERVENTION N/A 04/12/2017   Procedure: CORONARY STENT INTERVENTION;  Surgeon: Lorretta Harp, MD;  Location: [place] [facility];  Service: Cardiovascular;  Laterality: N/A;   CORONARY STENT INTERVENTION N/A 02/08/2018   Procedure: CORONARY STENT INTERVENTION;  Surgeon: Jettie Booze, MD;  Location: [place] [facility];  Service:  Cardiovascular;  Laterality: N/A;   CORONARY STENT INTERVENTION N/A 11/12/2018   Procedure: CORONARY STENT INTERVENTION;  Surgeon: Wellington Hampshire, MD;  Location: [place] [facility];  Service: Cardiovascular;  Laterality: N/A;   CORONARY STENT INTERVENTION N/A 06/22/2020   Procedure: CORONARY STENT INTERVENTION;  Surgeon: Jettie Booze, MD;  Location: [place] [facility];  Service: Cardiovascular;  Laterality: N/A;   EP IMPLANTABLE DEVICE N/A 04/23/2015   Procedure: PPM Generator Changeout;  Surgeon: Sanda Klein, MD;  Location: [place] [facility];  Service: Cardiovascular;  Laterality: N/A;   FALSE ANEURYSM REPAIR Left 11/29/2018    Procedure: REPAIR FALSE ANEURYSM LEFT RADIAL ARTERY;  Surgeon: [name], MD;  Location: [place];  Service: Vascular;  Laterality: Left;   FEMORAL ARTERY STENT Left ~ 2014   "taken out of my leg; couldn' catorgorize what kind so the put it under all 3"; cataroziepitheloid hemanioendotheliomau   FOOT FRACTURE SURGERY Left [place] / REPLACE / REMOVE PACEMAKER  10/13/05   right side, medtronic Adapta   KNEE HARDWARE REMOVAL Right 1950's   "3-4 months after the insertion"   KNEE SURGERY Right 1950's   "broke my lower leg; had to put pin in my knee to keep lower leg in place til it healed"   LEFT HEART CATH AND CORS/GRAFTS ANGIOGRAPHY N/A 04/12/2017   Procedure: LEFT HEART CATH AND CORS/GRAFTS ANGIOGRAPHY;  Surgeon: Lorretta Harp, MD;  Location: [place] [facility];  Service: Cardiovascular;  Laterality: N/A;   LEFT HEART CATH AND CORS/GRAFTS ANGIOGRAPHY N/A 02/06/2018   Procedure: LEFT HEART CATH AND CORS/GRAFTS ANGIOGRAPHY;  Surgeon: Troy Sine, MD;  Location: [place] [facility];  Service: Cardiovascular;  Laterality: N/A;   LEFT HEART CATH AND CORS/GRAFTS ANGIOGRAPHY N/A 11/12/2018   Procedure: LEFT HEART CATH AND CORS/GRAFTS ANGIOGRAPHY;  Surgeon: Wellington Hampshire, MD;  Location: [place] [facility];  Service: Cardiovascular;  Laterality: N/A;   LEFT HEART CATH AND CORS/GRAFTS ANGIOGRAPHY N/A 06/22/2020   Procedure: LEFT HEART CATH AND CORS/GRAFTS ANGIOGRAPHY;  Surgeon: Jettie Booze, MD;  Location: [place] [facility];  Service: Cardiovascular;  Laterality: N/A;   PERIPHERAL VASCULAR CATHETERIZATION N/A 11/25/2015   Procedure: Lower Extremity Angiography;  Surgeon: Lorretta Harp, MD;  Location: [place] [facility];  Service: Cardiovascular;  Laterality: N/A;   PERIPHERAL VASCULAR CATHETERIZATION Left 11/25/2015   Procedure: Peripheral Vascular Intervention;  Surgeon: Lorretta Harp, MD;  Location: [place] [facility];  Service: Cardiovascular;  Laterality:  Left;  external iliac   PERIPHERAL VASCULAR CATHETERIZATION N/A 01/03/2016   Procedure: Lower Extremity Angiography;  Surgeon: Lorretta Harp, MD;  Location: [place] [facility];  Service: Cardiovascular;  Laterality: N/A;   PERIPHERAL VASCULAR CATHETERIZATION Left 01/03/2016   Procedure: Peripheral Vascular Intervention;  Surgeon: Lorretta Harp, MD;  Location: [place] [facility];  Service: Cardiovascular;  Laterality: Left;  SFA   PERIPHERAL VASCULAR CATHETERIZATION Right 02/14/2016   Procedure: Peripheral Vascular Atherectomy;  Surgeon: Lorretta Harp, MD;  Location: [place] [facility];  Service: Cardiovascular;  Laterality: Right;  SFA   POPLITEAL ARTERY STENT  01/03/2016   Contralateral access with a 7 [place] crossover sheath (second order catheter placement)   TONSILLECTOMY AND ADENOIDECTOMY     TUMOR EXCISION Right ~ 2005   cancerous tumor removed from shoulder   TUMOR EXCISION Right ~ 2000   benign tumor removed from under shoulder   TUMOR EXCISION  Left 06/02/2010   resection of Lt SFA wth interposition of Gore-Tex graft    There were no vitals filed for this visit.   Subjective Assessment - 03/31/21 1642     Subjective Pt reports reduction in dizzy spells. Pt notes no dizziness with bed transfers. He has been performing his home epley maneuver    Patient Stated Goals decrease dizziness and improve balance    Currently in Pain? No/denies                     Vestibular Assessment - 03/31/21 0001       Dix-Hallpike Right   Dix-Hallpike Right Duration no symptoms      Dix-Hallpike Left   Dix-Hallpike Left Duration no symptoms      Horizontal Canal Right   Horizontal Canal Right Duration no symptoms      Horizontal Canal Left   Horizontal Canal Left Duration no symptoms                      [facility] Adult PT Treatment/Exercise - 03/31/21 0001       Self-Care   Self-Care Other Self-Care Comments    Other Self-Care Comments  Reviewed self epley  maneuver once again. Reiterated to pt that once he tests if he has dizziness or not he does not have to roll to his side             Vestibular Treatment/Exercise - 03/31/21 0001       Vestibular Treatment/Exercise   Habituation Exercises Standing Horizontal Head Turns    Gaze Exercises X1 Viewing Horizontal;X1 Viewing Vertical       EPLEY MANUEVER RIGHT   Number of Reps  2    Response Details  Provided for demonstration and self care      X1 Viewing Horizontal   Foot Position walking    Comments 4x25'      X1 Viewing Vertical   Foot Position Walking    Comments 4x25'                Balance Exercises - 03/31/21 0001       Balance Exercises: Standing   Standing Eyes Opened Head turns;Solid surface   x10 each head turns and head nods   Standing Eyes Closed Head turns;Foam/compliant surface   x30 sec static; 2x10 each with head turns and head nods   Tandem Stance Eyes open;30 secs;2 reps    SLS Eyes open;2 reps;30 secs;Intermittent upper extremity support    Gait with Head Turns Forward;4 reps   x25' and then with head nods   Other Standing Exercises Walking with ball toss up/down around track CW & then CCW    Other Standing Exercises Comments Walking with ball L<>R around track [place] - 03/31/21 1630       PT LONG TERM GOAL #1   Title Pt will be independent with HEP    Time 6    Period Weeks    Status On-going    Target Date 04/29/21      PT LONG TERM GOAL #2   Title Pt will consistently perform supine <> sit with dizziness less than 1/10    Baseline 0/10 on 03/31/21    Time 6    Period Weeks    Status Achieved    Target Date  04/29/21      PT LONG TERM GOAL #3   Title Pt will perform SLS for >=10 sec bilat to demo improved balance and decreased risk of falls    Time 6    Period Weeks    Status On-going    Target Date 04/29/21      PT LONG TERM GOAL #4   Title Pt will be (-) for BPPV in all  testing positions x 2 weeks    Baseline (+) R posterior canal on 03/24/21    Status On-going    Target Date 04/29/21                   Plan - 03/31/21 1648     Clinical Impression Statement Pt with (-) BPPV in a canalith postions this session. Reviewed home Epley maneuver and testing at home. Rest of session focused on improving pt's balance and vestibular integration with balance. Pt is making great gains towards his goals. Discussed with pt that he would benefit from continued work on his balance but since his dizziness has improved which was his primary concern we could also think about possible d/c. Pt was highly challenged with gait + VOR.    Personal Factors and Comorbidities Comorbidity 3+;Age    Examination-Activity Limitations Bed Mobility;Locomotion Level    Examination-Participation Restrictions Community Activity    Stability/Clinical Decision Making Evolving/Moderate complexity    Rehab Potential Good    PT Frequency 2x / week    PT Duration 6 weeks    PT Treatment/Interventions Vasopneumatic Device;Vestibular;Taping;Patient/family education;Manual techniques;Neuromuscular re-education;Balance training;Therapeutic exercise;Therapeutic activities;Gait training;Cryotherapy;Canalith Repostioning;Moist Heat;DME Instruction    PT Next Visit Plan Recheck canaliths as needed. Continue with balance, habituation and gaze stabilization.    PT Home Exercise Plan [id number]             Patient will benefit from skilled therapeutic intervention in order to improve the following deficits and impairments:  Dizziness, Decreased balance, Difficulty walking  Visit Diagnosis: Dizziness and giddiness  Balance disorder     Problem List Patient Active Problem List   Diagnosis Date Noted   Hematoma of right hip 01/10/2021   Primary osteoarthritis of right elbow 01/10/2021   Chronic anticoagulation 08/12/2019   Dyslipidemia (high LDL; low HDL) 03/05/2019   Unstable angina  (HCC) 11/11/2018   Hypercholesterolemia 02/20/2018   Paroxysmal atrial fibrillation ([place]) 02/20/2018   CAD (coronary artery disease) of bypass graft 02/06/2018   CAD, multiple vessel 02/06/2018   Leukocytosis 02/16/2016   Peripheral arterial disease ([place])    Pacemaker 04/23/2015   Positive cardiac stress test    OSA on CPAP 11/04/2012   SSS (sick sinus syndrome), medtronic adapta    Hyperlipidemia due to type 2 diabetes mellitus ([place])    Superficial femoral artery injury 05/09/2011   SPRAIN&STRAIN OTHER SPECIFIED SITES KNEE&LEG 01/31/2010   Type 2 diabetes mellitus with complication, without long-term current use of insulin ([place]) 11/26/2006   Essential hypertension 11/26/2006    [facility] April Gordy Levan, PT, DPT 03/31/2021, 5:52 PM  [facility] [place] [address] [place], [place], [address] Phone: [phone]   Fax:  [phone]  Name: Robert Burns MRN: [id number] Date of Birth: 05-Jun-1940

## 2021-04-08 ENCOUNTER — Other Ambulatory Visit: Payer: Self-pay

## 2021-04-08 ENCOUNTER — Ambulatory Visit (INDEPENDENT_AMBULATORY_CARE_PROVIDER_SITE_OTHER): Payer: Medicare HMO | Admitting: Physical Therapy

## 2021-04-08 DIAGNOSIS — R42 Dizziness and giddiness: Secondary | ICD-10-CM

## 2021-04-08 DIAGNOSIS — R2689 Other abnormalities of gait and mobility: Secondary | ICD-10-CM

## 2021-04-08 NOTE — Therapy (Signed)
[facility] [place], [place], [address] Phone: [phone]   Fax:  [phone]  Physical Therapy Treatment and Discharge  Patient Details  Name: Robert Burns MRN: [id number] Date of Birth: 1940-10-27 Referring Provider (PT): Will Bonnet  PHYSICAL THERAPY DISCHARGE SUMMARY  Visits from Start of Care: 4  Current functional level related to goals / functional outcomes: See below   Remaining deficits: See below   Education / Equipment: See below   Patient agrees to discharge. Patient goals were partially met. Patient is being discharged due to being pleased with the current functional level.   Encounter Date: 04/08/2021   PT End of Session - 04/08/21 1102     Visit Number 4    Number of Visits 12    Date for PT Re-Evaluation 04/29/21    Authorization - Visit Number 4    Progress Note Due on Visit 10    PT Start Time 1100    PT Stop Time 1130    PT Time Calculation (min) 30 min    Activity Tolerance Patient tolerated treatment well    Behavior During Therapy WFL for tasks assessed/performed             Past Medical History:  Diagnosis Date   Arthritis    "all over" (11/25/2015)   CAD (coronary artery disease)    a. s/p CABG  06/2002; b. 02/01/15 PCI: DES to prox SVG to PDA, staged PCI of SVG to Diag in 02/2015; c. 04/2017 Cath/PCI: LM nl, LAD 100ost, 52m 75d, LCX 60ost, OM2 80, RCA 100ost, RPDA 80, LIMA->LAD ok, VG->D1 patent stent, VG->RPDA patent stent, 50p, VG->OM1->OM2 90p (3.0x24 Synergy DES), 100 between OM1->OM2 (med rx).   Cancer ([facility]    Right Shoulder, Left Leg- BCC, SCC, AND MELANOMA   Epithelioid hemangioendothelioma    s/p resection of left SFA/mass with interposition 6 mm GoreTex graft 06/02/10, s/p repeat resection for positive margins 09/22/10   Hyperlipidemia    Hypertension    Leg pain    OSA on CPAP    PAD (peripheral artery disease) ([place]    a. 10/2002 L SFA  PTA/BMS; b. 8/17 LE Angio: LEIA 90 (9x40 self exp stent), LSFA short segment prox occlusion (staged PTA/stenting 01/03/2016), patent mid stent, RSFA 949mstaged PTA/DEB 02/14/2016).   Presence of permanent cardiac pacemaker    Medtronic   SSS (sick sinus syndrome) ([facility]   a. s/p PPM in 2007 with gen change 04/2015 - Medtronic Adapta ADDRL1, ser # [id number].   Type II diabetes mellitus (HCC)    Type II   Urgency of urination     Past Surgical History:  Procedure Laterality Date   CARDIAC CATHETERIZATION N/A 02/01/2015   Procedure: Left Heart Cath and Coronary Angiography;  Surgeon: JoLorretta HarpMD;  [facility];  Service: Cardiovascular;  Laterality: N/A;   CARDIAC CATHETERIZATION N/A 02/01/2015   Procedure: Coronary Stent Intervention;  Surgeon: JoLorretta HarpMD;  [facility];  Service: Cardiovascular;  Laterality: N/A;   CARDIAC CATHETERIZATION  06/2002   "just before bypass OR"   CARDIAC CATHETERIZATION N/A 03/01/2015   Procedure: Coronary Stent Intervention;  Surgeon: JoLorretta HarpMD;  [facility];  Service: Cardiovascular;  Laterality: N/A;   CARDIAC CATHETERIZATION  02/06/2018   COLONOSCOPY     CORONARY ANGIOPLASTY     CORONARY ARTERY BYPASS GRAFT  06/2002   x5, LIMA-LAD;VG- Diag; seq VG-  ramus & OM branch; VG-PDA   CORONARY STENT INTERVENTION N/A 04/12/2017   Procedure: CORONARY STENT INTERVENTION;  Surgeon: Lorretta Harp, MD;  Location: [place] [facility];  Service: Cardiovascular;  Laterality: N/A;   CORONARY STENT INTERVENTION N/A 02/08/2018   Procedure: CORONARY STENT INTERVENTION;  Surgeon: Jettie Booze, MD;  Location: [place] [facility];  Service: Cardiovascular;  Laterality: N/A;   CORONARY STENT INTERVENTION N/A 11/12/2018   Procedure: CORONARY STENT INTERVENTION;  Surgeon: Wellington Hampshire, MD;  Location: [place] [facility];  Service: Cardiovascular;  Laterality: N/A;   CORONARY STENT INTERVENTION N/A  06/22/2020   Procedure: CORONARY STENT INTERVENTION;  Surgeon: Jettie Booze, MD;  Location: [place] [facility];  Service: Cardiovascular;  Laterality: N/A;   EP IMPLANTABLE DEVICE N/A 04/23/2015   Procedure: PPM Generator Changeout;  Surgeon: Sanda Klein, MD;  Location: [place] [facility];  Service: Cardiovascular;  Laterality: N/A;   FALSE ANEURYSM REPAIR Left 11/29/2018   Procedure: REPAIR FALSE ANEURYSM LEFT RADIAL ARTERY;  Surgeon: [name], MD;  Location: [place];  Service: Vascular;  Laterality: Left;   FEMORAL ARTERY STENT Left ~ 2014   "taken out of my leg; couldn' catorgorize what kind so the put it under all 3"; cataroziepitheloid hemanioendotheliomau   FOOT FRACTURE SURGERY Left [place] / REPLACE / REMOVE PACEMAKER  10/13/05   right side, medtronic Adapta   KNEE HARDWARE REMOVAL Right 1950's   "3-4 months after the insertion"   KNEE SURGERY Right 1950's   "broke my lower leg; had to put pin in my knee to keep lower leg in place til it healed"   LEFT HEART CATH AND CORS/GRAFTS ANGIOGRAPHY N/A 04/12/2017   Procedure: LEFT HEART CATH AND CORS/GRAFTS ANGIOGRAPHY;  Surgeon: Lorretta Harp, MD;  Location: [place] [facility];  Service: Cardiovascular;  Laterality: N/A;   LEFT HEART CATH AND CORS/GRAFTS ANGIOGRAPHY N/A 02/06/2018   Procedure: LEFT HEART CATH AND CORS/GRAFTS ANGIOGRAPHY;  Surgeon: Troy Sine, MD;  Location: [place] [facility];  Service: Cardiovascular;  Laterality: N/A;   LEFT HEART CATH AND CORS/GRAFTS ANGIOGRAPHY N/A 11/12/2018   Procedure: LEFT HEART CATH AND CORS/GRAFTS ANGIOGRAPHY;  Surgeon: Wellington Hampshire, MD;  Location: [place] [facility];  Service: Cardiovascular;  Laterality: N/A;   LEFT HEART CATH AND CORS/GRAFTS ANGIOGRAPHY N/A 06/22/2020   Procedure: LEFT HEART CATH AND CORS/GRAFTS ANGIOGRAPHY;  Surgeon: Jettie Booze, MD;  Location: [place] [facility];  Service: Cardiovascular;  Laterality: N/A;   PERIPHERAL  VASCULAR CATHETERIZATION N/A 11/25/2015   Procedure: Lower Extremity Angiography;  Surgeon: Lorretta Harp, MD;  Location: [place] [facility];  Service: Cardiovascular;  Laterality: N/A;   PERIPHERAL VASCULAR CATHETERIZATION Left 11/25/2015   Procedure: Peripheral Vascular Intervention;  Surgeon: Lorretta Harp, MD;  Location: [place] [facility];  Service: Cardiovascular;  Laterality: Left;  external iliac   PERIPHERAL VASCULAR CATHETERIZATION N/A 01/03/2016   Procedure: Lower Extremity Angiography;  Surgeon: Lorretta Harp, MD;  Location: [place] [facility];  Service: Cardiovascular;  Laterality: N/A;   PERIPHERAL VASCULAR CATHETERIZATION Left 01/03/2016   Procedure: Peripheral Vascular Intervention;  Surgeon: Lorretta Harp, MD;  Location: [place] [facility];  Service: Cardiovascular;  Laterality: Left;  SFA   PERIPHERAL VASCULAR CATHETERIZATION Right 02/14/2016   Procedure: Peripheral Vascular Atherectomy;  Surgeon: Lorretta Harp, MD;  Location: [place] [facility];  Service: Cardiovascular;  Laterality: Right;  SFA   POPLITEAL ARTERY STENT  01/03/2016   Contralateral access with a 7 [place] crossover sheath (second order catheter placement)   TONSILLECTOMY AND ADENOIDECTOMY     TUMOR EXCISION Right ~ 2005   cancerous tumor removed from shoulder   TUMOR EXCISION Right ~ 2000   benign tumor removed from under shoulder   TUMOR EXCISION Left 06/02/2010   resection of Lt SFA wth interposition of Gore-Tex graft    There were no vitals filed for this visit.   Subjective Assessment - 04/08/21 1103     Subjective Pt states he's been doing the maneuvers once every morning. Reports just one intance of dizziness but it resolved (believes he was just sitting) and it went away right away.    Patient Stated Goals decrease dizziness and improve balance    Currently in Pain? No/denies                [facility] PT Assessment - 04/08/21 0001       Assessment   Medical Diagnosis dizziness and  giddiness    Referring Provider (PT) Will Bonnet    Onset Date/Surgical Date 02/04/21                 Vestibular Assessment - 04/08/21 0001       Dix-Hallpike Right   Dix-Hallpike Right Duration no symptoms      Dix-Hallpike Left   Dix-Hallpike Left Duration no symptoms      Horizontal Canal Right   Horizontal Canal Right Duration no symptoms      Horizontal Canal Left   Horizontal Canal Left Duration no symptoms                           Balance Exercises - 04/08/21 0001       Balance Exercises: Standing   Standing Eyes Opened Head turns;Solid surface    Standing Eyes Closed Head turns;Foam/compliant surface;Narrow base of support (BOS)    Tandem Stance Eyes open;30 secs;2 reps    SLS Eyes open;Intermittent upper extremity support;3 reps;10 secs    Gait with Head Turns Forward;4 reps    Tandem Gait Forward;2 reps    Retro Gait 4 reps                     PT Long Term Goals - 04/08/21 1133       PT LONG TERM GOAL #1   Title Pt will be independent with HEP    Baseline Has been performing his maneuvers and checking canaliths at home but not doing the rest of his balance exercises 04/08/21    Time 6    Period Weeks    Status Partially Met    Target Date 04/29/21      PT LONG TERM GOAL #2   Title Pt will consistently perform supine <> sit with dizziness less than 1/10    Baseline 0/10 on 03/31/21    Time 6    Period Weeks    Status Achieved    Target Date 04/29/21      PT LONG TERM GOAL #3   Title Pt will perform SLS for >=10 sec bilat to demo improved balance and decreased risk of falls    Baseline 8 sec on R, 6 sec on L    Time 6    Period Weeks    Status Partially Met    Target Date 04/29/21      PT LONG  TERM GOAL #4   Title Pt will be (-) for BPPV in all testing positions x 2 weeks    Baseline (+) R posterior canal on 03/24/21; (-) bilat on 04/08/21    Status Achieved    Target Date 04/29/21                    Plan - 04/08/21 1134     Clinical Impression Statement Pt remains (-) for BPPV in all canalith positions. Pt has been checking for BPPV at home and demonstrates good understanding. Discussed continuing PT for his balance but pt states he feels he can work on this at home. Rest of session focused on reviewing and providing final HEP for static and dynamic balance. Continues to have LOB during gait with head turns and with narrower bases of support (ie. tandem stance and SLS). Pt feels ready for PT d/c.    Personal Factors and Comorbidities Comorbidity 3+;Age    Examination-Activity Limitations Bed Mobility;Locomotion Level    Examination-Participation Restrictions Community Activity    Stability/Clinical Decision Making Evolving/Moderate complexity    Rehab Potential Good    PT Frequency 2x / week    PT Duration 6 weeks    PT Treatment/Interventions Vasopneumatic Device;Vestibular;Taping;Patient/family education;Manual techniques;Neuromuscular re-education;Balance training;Therapeutic exercise;Therapeutic activities;Gait training;Cryotherapy;Canalith Repostioning;Moist Heat;DME Instruction    PT Next Visit Plan Recheck canaliths as needed. Continue with balance, habituation and gaze stabilization.    PT Home Exercise Plan [id number]             Patient will benefit from skilled therapeutic intervention in order to improve the following deficits and impairments:  Dizziness, Decreased balance, Difficulty walking  Visit Diagnosis: Dizziness and giddiness  Balance disorder     Problem List Patient Active Problem List   Diagnosis Date Noted   Hematoma of right hip 01/10/2021   Primary osteoarthritis of right elbow 01/10/2021   Chronic anticoagulation 08/12/2019   Dyslipidemia (high LDL; low HDL) 03/05/2019   Unstable angina (HCC) 11/11/2018   Hypercholesterolemia 02/20/2018   Paroxysmal atrial fibrillation ([place]) 02/20/2018   CAD (coronary artery disease) of bypass  graft 02/06/2018   CAD, multiple vessel 02/06/2018   Leukocytosis 02/16/2016   Peripheral arterial disease ([place])    Pacemaker 04/23/2015   Positive cardiac stress test    OSA on CPAP 11/04/2012   SSS (sick sinus syndrome), medtronic adapta    Hyperlipidemia due to type 2 diabetes mellitus ([place])    Superficial femoral artery injury 05/09/2011   SPRAIN&STRAIN OTHER SPECIFIED SITES KNEE&LEG 01/31/2010   Type 2 diabetes mellitus with complication, without long-term current use of insulin ([place]) 11/26/2006   Essential hypertension 11/26/2006    [facility] April Gordy Levan, PT, DPT 04/08/2021, 11:37 AM  [facility] [address] [place], [place], [address] Phone: [phone]   Fax:  [phone]  Name: Robert Burns MRN: [id number] Date of Birth: 1940-11-17

## 2021-04-19 DIAGNOSIS — R059 Cough, unspecified: Secondary | ICD-10-CM | POA: Diagnosis not present

## 2021-04-19 DIAGNOSIS — J069 Acute upper respiratory infection, unspecified: Secondary | ICD-10-CM | POA: Diagnosis not present

## 2021-04-19 DIAGNOSIS — Z03818 Encounter for observation for suspected exposure to other biological agents ruled out: Secondary | ICD-10-CM | POA: Diagnosis not present

## 2021-04-19 DIAGNOSIS — J029 Acute pharyngitis, unspecified: Secondary | ICD-10-CM | POA: Diagnosis not present

## 2021-04-20 DIAGNOSIS — E1142 Type 2 diabetes mellitus with diabetic polyneuropathy: Secondary | ICD-10-CM | POA: Diagnosis not present

## 2021-04-20 DIAGNOSIS — E1151 Type 2 diabetes mellitus with diabetic peripheral angiopathy without gangrene: Secondary | ICD-10-CM | POA: Diagnosis not present

## 2021-04-20 DIAGNOSIS — I10 Essential (primary) hypertension: Secondary | ICD-10-CM | POA: Diagnosis not present

## 2021-04-20 DIAGNOSIS — J069 Acute upper respiratory infection, unspecified: Secondary | ICD-10-CM | POA: Diagnosis not present

## 2021-04-20 DIAGNOSIS — N401 Enlarged prostate with lower urinary tract symptoms: Secondary | ICD-10-CM | POA: Diagnosis not present

## 2021-04-20 DIAGNOSIS — I25709 Atherosclerosis of coronary artery bypass graft(s), unspecified, with unspecified angina pectoris: Secondary | ICD-10-CM | POA: Diagnosis not present

## 2021-04-28 ENCOUNTER — Ambulatory Visit
Admission: RE | Admit: 2021-04-28 | Discharge: 2021-04-28 | Disposition: A | Discharge status: Home / Self Care | Payer: Medicare HMO | Source: Ambulatory Visit | Attending: Physician Assistant | Admitting: Physician Assistant

## 2021-04-28 ENCOUNTER — Other Ambulatory Visit: Payer: Self-pay | Admitting: Physician Assistant

## 2021-04-28 DIAGNOSIS — I951 Orthostatic hypotension: Secondary | ICD-10-CM

## 2021-04-28 DIAGNOSIS — R053 Chronic cough: Secondary | ICD-10-CM | POA: Diagnosis not present

## 2021-04-28 DIAGNOSIS — R051 Acute cough: Secondary | ICD-10-CM

## 2021-04-28 DIAGNOSIS — D649 Anemia, unspecified: Secondary | ICD-10-CM | POA: Diagnosis not present

## 2021-05-04 DIAGNOSIS — J209 Acute bronchitis, unspecified: Secondary | ICD-10-CM | POA: Diagnosis not present

## 2021-05-17 ENCOUNTER — Ambulatory Visit (INDEPENDENT_AMBULATORY_CARE_PROVIDER_SITE_OTHER): Payer: Medicare HMO

## 2021-05-17 DIAGNOSIS — I495 Sick sinus syndrome: Secondary | ICD-10-CM | POA: Diagnosis not present

## 2021-05-17 LAB — CUP PACEART REMOTE DEVICE CHECK
Battery Impedance: 557 Ohm
Battery Remaining Longevity: 91 mo
Battery Voltage: 2.79 V
Brady Statistic AP VP Percent: 2 %
Brady Statistic AP VS Percent: 97 %
Brady Statistic AS VP Percent: 0 %
Brady Statistic AS VS Percent: 1 %
Date Time Interrogation Session: 20230206132505
Implantable Lead Implant Date: 20070706
Implantable Lead Implant Date: 20070706
Implantable Lead Location: 753859
Implantable Lead Location: 753860
Implantable Lead Model: 5076
Implantable Lead Model: 5092
Implantable Pulse Generator Implant Date: 20170113
Lead Channel Impedance Value: 453 Ohm
Lead Channel Impedance Value: 711 Ohm
Lead Channel Pacing Threshold Amplitude: 0.75 V
Lead Channel Pacing Threshold Amplitude: 1.375 V
Lead Channel Pacing Threshold Pulse Width: 0.4 ms
Lead Channel Pacing Threshold Pulse Width: 0.4 ms
Lead Channel Setting Pacing Amplitude: 1.5 V
Lead Channel Setting Pacing Amplitude: 2.75 V
Lead Channel Setting Pacing Pulse Width: 0.4 ms
Lead Channel Setting Sensing Sensitivity: 5.6 mV

## 2021-05-20 DIAGNOSIS — J069 Acute upper respiratory infection, unspecified: Secondary | ICD-10-CM | POA: Diagnosis not present

## 2021-05-20 DIAGNOSIS — G44209 Tension-type headache, unspecified, not intractable: Secondary | ICD-10-CM | POA: Diagnosis not present

## 2021-05-20 NOTE — Progress Notes (Signed)
Remote pacemaker transmission.   

## 2021-06-21 DIAGNOSIS — Z87891 Personal history of nicotine dependence: Secondary | ICD-10-CM | POA: Diagnosis not present

## 2021-06-21 DIAGNOSIS — N400 Enlarged prostate without lower urinary tract symptoms: Secondary | ICD-10-CM | POA: Diagnosis not present

## 2021-06-21 DIAGNOSIS — E119 Type 2 diabetes mellitus without complications: Secondary | ICD-10-CM | POA: Diagnosis not present

## 2021-06-21 DIAGNOSIS — Z951 Presence of aortocoronary bypass graft: Secondary | ICD-10-CM | POA: Diagnosis not present

## 2021-06-21 DIAGNOSIS — I440 Atrioventricular block, first degree: Secondary | ICD-10-CM | POA: Diagnosis not present

## 2021-06-21 DIAGNOSIS — R531 Weakness: Secondary | ICD-10-CM | POA: Diagnosis not present

## 2021-06-21 DIAGNOSIS — R42 Dizziness and giddiness: Secondary | ICD-10-CM | POA: Diagnosis not present

## 2021-06-21 DIAGNOSIS — R079 Chest pain, unspecified: Secondary | ICD-10-CM | POA: Diagnosis not present

## 2021-06-21 DIAGNOSIS — R0789 Other chest pain: Secondary | ICD-10-CM | POA: Diagnosis not present

## 2021-06-21 DIAGNOSIS — I10 Essential (primary) hypertension: Secondary | ICD-10-CM | POA: Diagnosis not present

## 2021-06-21 DIAGNOSIS — Z955 Presence of coronary angioplasty implant and graft: Secondary | ICD-10-CM | POA: Diagnosis not present

## 2021-06-21 DIAGNOSIS — Z950 Presence of cardiac pacemaker: Secondary | ICD-10-CM | POA: Diagnosis not present

## 2021-06-21 DIAGNOSIS — I2510 Atherosclerotic heart disease of native coronary artery without angina pectoris: Secondary | ICD-10-CM | POA: Diagnosis not present

## 2021-06-21 DIAGNOSIS — Z888 Allergy status to other drugs, medicaments and biological substances status: Secondary | ICD-10-CM | POA: Diagnosis not present

## 2021-06-21 DIAGNOSIS — I4430 Unspecified atrioventricular block: Secondary | ICD-10-CM | POA: Diagnosis not present

## 2021-06-22 ENCOUNTER — Telehealth: Payer: Self-pay | Admitting: Cardiovascular Disease

## 2021-06-22 NOTE — Telephone Encounter (Signed)
Attempted to call patient, left message for patient to call back to office.   

## 2021-06-22 NOTE — Telephone Encounter (Signed)
STAT if patient feels like he/she is going to faint  ? ?Are you dizzy now?  ?No. Patient states yesterday he had an episode at the [facility]. He states he had been fasting during breakfast and before noon he became shaky and dizzy. He went to sit down and states his complexion turned gray within 4-5 minutes. Paramedics were onsite at exhibit and they took a look at patient- they checked BP/HR and oxygen: 110/70, HR was in the 70's, O2 was 99-100. [facility] staff advised patient to go to ED. Patient went to [facility] in [place] - took lab work, chest xray, EKG, and PPM interrogation. Any recommendations? ? ?Do you feel faint or have you passed out?  ?No  ? ?Do you have any other symptoms?  ?No  ? ?Have you checked your HR and BP (record if available)?  ?3/14: 110/70 70's ? ? ?

## 2021-06-28 NOTE — Telephone Encounter (Signed)
Pt has appt scheduled 3-23 ?

## 2021-06-30 ENCOUNTER — Encounter: Payer: Self-pay | Admitting: Cardiovascular Disease

## 2021-06-30 ENCOUNTER — Other Ambulatory Visit: Payer: Self-pay

## 2021-06-30 ENCOUNTER — Ambulatory Visit: Payer: Medicare HMO | Admitting: Cardiovascular Disease

## 2021-06-30 VITALS — BP 119/68 | HR 72 | Ht 67.0 in | Wt 164.8 lb

## 2021-06-30 DIAGNOSIS — I495 Sick sinus syndrome: Secondary | ICD-10-CM | POA: Diagnosis not present

## 2021-06-30 DIAGNOSIS — E7800 Pure hypercholesterolemia, unspecified: Secondary | ICD-10-CM

## 2021-06-30 DIAGNOSIS — E1169 Type 2 diabetes mellitus with other specified complication: Secondary | ICD-10-CM | POA: Diagnosis not present

## 2021-06-30 DIAGNOSIS — E785 Hyperlipidemia, unspecified: Secondary | ICD-10-CM | POA: Diagnosis not present

## 2021-06-30 DIAGNOSIS — I2510 Atherosclerotic heart disease of native coronary artery without angina pectoris: Secondary | ICD-10-CM

## 2021-06-30 DIAGNOSIS — I480 Paroxysmal atrial fibrillation: Secondary | ICD-10-CM | POA: Diagnosis not present

## 2021-06-30 DIAGNOSIS — I10 Essential (primary) hypertension: Secondary | ICD-10-CM

## 2021-06-30 NOTE — Assessment & Plan Note (Signed)
History of PAF in the past maintaining sinus rhythm on Xarelto ?

## 2021-06-30 NOTE — Assessment & Plan Note (Signed)
History of CAD status post bypass grafting March 2004 with a LIMA to his LAD, vein to a diagonal branch, sequential vein to ramus branch, OM branch and to the PDA.  He had multiple interventions in the past most recently by Dr. Irish Lack 06/21/2020.  He did intervene on the ramus branch and PDA vein grafts.  Myoview performed 11/09/2020 by Dr. Davina Poke was low risk and nonischemic.  He is on maximal antianginal medications including oral nitrates, beta-blocker and hydralazine.  He gets rare chest pain ?

## 2021-06-30 NOTE — Progress Notes (Signed)
? ? ? ?06/30/2021 ?Parmvir Boomer Kotz   ?Jul 23, 1940  ?[id number] ? ?Primary Physician [name], MD ?Primary Cardiologist: Lorretta Harp MD [name], [name] ? ?HPI:  Robert Burns is a 81 y.o.  married Caucasian male, father of 2, grandfather to 3 grandchildren whose wife [name] who is also a patient of mine. I last saw him in the office 03/25/2021.[name]  He is accompanied by his wife today.  He has a history of CAD status post coronary artery bypass grafting March 2004 with a LIMA to his LAD, a vein to a diagonal branch, a sequential vein to a ramus and OM branch, as well as a vein to the PDA. Last functional study performed July 28, 2011, was entirely normal. 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 He had a pacemaker placed for sick sinus syndrome November 2008 followed by Dr. Sallyanne Kuster. He has obstructive sleep apnea on CPAP. He complain of left thigh pain and I angiogram'd him revealing patent arteries though he did have a space-occupying lesion which was removed by Dr. Kellie Simmering with placement of an interposition 6-mm Gore-Tex graft. The pathology was uncertain. 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 Dr. Baxter Flattery follows his lipid profile.Since I saw him back 11/07/12 he has done well.  Followup Dopplers performed in our office 09/27/12 revealed a high-grade lesion in the distal right SFA with an ABI of 0.3. His left ABI 1.1 without obstructive disease.  His most recent lower extremity Doppler Dopplers performed 9/32/16 revealed a right ABI 0.82  And a left ABI of 0.89. Over the last 3 months he's noticed anginal chest pain occurring both at rest and with exertion with left upper extremity radiation. He also complains of left lower extremity discomfort. to moderate anterolateral ischemia. Because of ongoing chest pain and a Myoview that showed anterolateral ischemia and he underwent cardiac catheterization on 02/01/15 revealing high grade segmental proximal right SVG  disease and subtotally occluded diagonal branch SVG. He underwent stenting of his RCA SVG successfully. He does have continued chest pain although somewhat improved since his last procedure.  I brought him back for staged diagonal branch SVG intervention on 03/01/15. This was successful and since that time he denies chest pain or shortness of breath. He underwent a generator change by Dr. Sallyanne Kuster in January for end-of-life of his Medtronic pacemaker. Since I saw him 6 months ago he developed new left calf claudication of the last 3 months. Lower extremity arterial Doppler studies performed today revealed a decline in his left ABI from 0.87 down to 0.59 with an occluded left SFA that is a new finding. 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 He had a short segment occlusion of the proximal left SFA with patent mid left SFA stent. He had 90% segmental calcified mid right SFA stenosis with three-vessel runoff bilaterally. I ended up stenting his left external iliac artery with a 9 mm x 40 mm long nitinol self-expanding  stent which improved his symptoms somewhat although he continued to have lifestyle limiting claudication. He underwent staged intervention of his left SFA on 01/03/16. I stented the entire quadrant totally occluded segment with a 6 mm x 250 mm long Viabahn  Stent. His claudication on the left  has resolved and his ABIs normalized. He now complains of right leg medication. I did demonstrate a 90%  calcified segmental mid right SFA stenosis at the time of angiography 11/25/15. He underwent diamondback orbital rotation atherectomy/drug-eluting angioplasty of his mid calcified right SFA by myself 02/14/16. This follow-up Dopplers performed 02/24/16 revealed normal ABIs bilaterally. His claudication has improved. ?When I saw him in the office possibly 3 weeks  ago he was complaining of fairly new onset substernal chest pain over the prior several months occurring several times a week. These were new since his RCA and diagonal branch vein graft interventions at the end of 2016. He is on good antianginal medications. A Myoview stress test was ordered that showed subtle inferolateral ischemia and echo showed normal LV function.. ?  ?He was admitted to the hospital 02/06/2018 for 3 days with unstable angina.  He underwent catheterization by Dr. Claiborne Billings on 02/06/2018 revealing disease in all 3 vein grafts as well as in the LAD beyond LIMA insertion, 2 days later he underwent complex intervention on his diagonal and ramus branch vein grafts with restenting as well as intervention on his LAD via LIMA insertion.  His RCA vein graft was not intervened on.  He currently denies angina or claudication. ?  ? He was admitted to the hospital with unstable angina on 11/11/2018.  Enzymes were negative.  He underwent catheterization and intervention by Dr. Fletcher Anon 11/12/2018 via the left radial approach with stenting of his proximal diagonal branch vein graft in his distal PDA vein graft.  Since discharge, his anginal symptoms have significantly improved although he still has a "heaviness feeling" in his chest.  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  This was subsequently surgically addressed by Dr. Donnetta Hutching. ?  ?Dr. Sallyanne Kuster did identify significant A. fib burden on remote interrogation of his pacemaker and he was subsequent begun on Eliquis which was transitioned to Xarelto.  He gets occasional chest pain not requiring sublingual nitroglycerin.  His most recent lower extremity arterial Doppler studies revealed patent left iliac and SFAs bilaterally.  Carotid Dopplers performed last December showed moderate right ICA stenosis. ?  ?He was admitted to the hospital with unstable angina 06/21/2020 and underwent diagnostic coronary angiography by Dr. Irish Lack via the femoral  approach with intervention of his ramus branch vein graft and PDA vein graft with excellent result.  Since discharge on 06/24/2020 he feels clinically improved but was feeling an episode of chest pain while raking leaves although no discomfort while doing water aerobics for 45 minutes. ? ?He was seen by Dr. Farris Has 11/09/2020 relating some of the symptoms of chest pain.  A Myoview stress test was ordered on 11/12/2020 which was entirely normal.  At this point, he is on maximum antianginal medications without room to add or increase and given the paucity of symptoms I did not feel compelled to pursue a more invasive approach at this time. ? ?Since I saw him 3  months ago he is remained clinically stable.  He does get occasional chest pain not requiring sublingual nitroglycerin.  He recently went to health care several weeks ago and had episode of "sinking".  He appeared ashen.  Vital signs were stable.  He was taken to the [facility] emergency room where an evaluation was unrevealing. ? ?Current Meds  ?Medication Sig  ? albuterol (VENTOLIN HFA) 108 (90 Base) MCG/ACT inhaler as needed.  ? amLODipine (NORVASC) 10 MG tablet Take 10 mg by mouth daily.  ? atorvastatin (LIPITOR) 80 MG tablet Take 1 tablet (80 mg total) by mouth daily at 6  PM.  ? Cholecalciferol (VITAMIN D3) 2000 units capsule Take 2,000 Units by mouth 2 (two) times daily.  ? clopidogrel (PLAVIX) 75 MG tablet Take 1 tablet (75 mg total) by mouth daily with breakfast.  ? COVID-19 mRNA bivalent vaccine, Moderna, (MODERNA COVID-19 BIVAL BOOSTER) 50 MCG/0.5ML injection Inject into the muscle.  ? dorzolamide-timolol (COSOPT) 22.3-6.8 MG/ML ophthalmic solution Place 1 drop into both eyes 2 (two) times daily.  ? glipiZIDE (GLUCOTROL) 5 MG tablet Take 5 mg by mouth daily with breakfast.   ? hydrALAZINE (APRESOLINE) 25 MG tablet Take 1 tablet (25 mg total) by mouth as needed (for BP >150 sys or 90 dia).  ? hydrochlorothiazide (HYDRODIURIL) 25 MG tablet Take 25 mg by  mouth every morning.  ? isosorbide mononitrate (IMDUR) 60 MG 24 hr tablet Take 1 tablet (60 mg total) by mouth daily.  ? ketorolac (ACULAR) 0.4 % SOLN Place 1 drop into both eyes 4 (four) times daily.  ?

## 2021-06-30 NOTE — Assessment & Plan Note (Signed)
History of sick sinus syndrome status post pacemaker insertion 10/13/2005 followed by Dr. Sallyanne Kuster . ?

## 2021-06-30 NOTE — Assessment & Plan Note (Signed)
Robert Burns of the walk history of hyperlipidemia on statin therapy with lipid profile performed 06/23/2020 revealing total cholesterol 72, LDL 30 and HDL 20. ?

## 2021-06-30 NOTE — Assessment & Plan Note (Signed)
History of essential hypertension a blood pressure measured today at 119/68.  He is on amlodipine, hydralazine, hydrochlorothiazide and metoprolol. ?

## 2021-06-30 NOTE — Patient Instructions (Signed)
Medication Instructions:  ?Your physician recommends that you continue on your current medications as directed. Please refer to the Current Medication list given to you today. ? ?*If you need a refill on your cardiac medications before your next appointment, please call your pharmacy* ? ? ?Lab Work: ?Your physician recommends that you have labs drawn today: Lipid/Liver profile. ? ?If you have labs (blood work) drawn today and your tests are completely normal, you will receive your results only by: ?MyChart Message (if you have MyChart) OR ?A paper copy in the mail ?If you have any lab test that is abnormal or we need to change your treatment, we will call you to review the results. ? ? ?Follow-Up: ?At [facility], you and your health needs are our priority.  As part of our continuing mission to provide you with exceptional heart care, we have created designated Provider Care Teams.  These Care Teams include your primary Cardiologist (physician) and Advanced Practice Providers (APPs -  Physician Assistants and Nurse Practitioners) who all work together to provide you with the care you need, when you need it. ? ?We recommend signing up for the patient portal called "MyChart".  Sign up information is provided on this After Visit Summary.  MyChart is used to connect with patients for Virtual Visits (Telemedicine).  Patients are able to view lab/test results, encounter notes, upcoming appointments, etc.  Non-urgent messages can be sent to your provider as well.   ?To learn more about what you can do with MyChart, go to [email or web address].   ? ?Your next appointment:   ?3 month(s) ? ?The format for your next appointment:   ?In Person ? ?Provider:   ?Coletta Memos, FNP, Fabian Sharp, PA-C, Sande Rives, PA-C, Caron Presume, PA-C, Jory Sims, DNP, ANP, or Almyra Deforest, PA-C   ? ? ?Then, Quay Burow, MD will plan to see you again in 6 month(s).  ?

## 2021-07-01 LAB — HEPATIC FUNCTION PANEL
ALT: 15 IU/L (ref 0–44)
AST: 19 IU/L (ref 0–40)
Albumin: 4.2 g/dL (ref 3.6–4.6)
Alkaline Phosphatase: 65 IU/L (ref 44–121)
Bilirubin Total: 0.3 mg/dL (ref 0.0–1.2)
Bilirubin, Direct: 0.14 mg/dL (ref 0.00–0.40)
Total Protein: 6.3 g/dL (ref 6.0–8.5)

## 2021-07-01 LAB — LIPID PANEL
Chol/HDL Ratio: 2.5 ratio (ref 0.0–5.0)
Cholesterol, Total: 88 mg/dL — ABNORMAL LOW (ref 100–199)
HDL: 35 mg/dL — ABNORMAL LOW (ref >39)
LDL Chol Calc (NIH): 38 mg/dL (ref 0–99)
Triglycerides: 71 mg/dL (ref 0–149)
VLDL Cholesterol Cal: 15 mg/dL (ref 5–40)

## 2021-08-01 DIAGNOSIS — G44229 Chronic tension-type headache, not intractable: Secondary | ICD-10-CM | POA: Diagnosis not present

## 2021-08-09 ENCOUNTER — Other Ambulatory Visit: Payer: Self-pay | Admitting: Internal Medicine

## 2021-08-09 ENCOUNTER — Ambulatory Visit
Admission: RE | Admit: 2021-08-09 | Discharge: 2021-08-09 | Disposition: A | Discharge status: Home / Self Care | Payer: Medicare HMO | Source: Ambulatory Visit | Attending: Internal Medicine | Admitting: Internal Medicine

## 2021-08-09 DIAGNOSIS — D509 Iron deficiency anemia, unspecified: Secondary | ICD-10-CM | POA: Diagnosis not present

## 2021-08-09 DIAGNOSIS — R519 Headache, unspecified: Secondary | ICD-10-CM | POA: Diagnosis not present

## 2021-08-09 DIAGNOSIS — E1151 Type 2 diabetes mellitus with diabetic peripheral angiopathy without gangrene: Secondary | ICD-10-CM | POA: Diagnosis not present

## 2021-08-09 DIAGNOSIS — D6869 Other thrombophilia: Secondary | ICD-10-CM | POA: Diagnosis not present

## 2021-08-09 DIAGNOSIS — G44229 Chronic tension-type headache, not intractable: Secondary | ICD-10-CM | POA: Diagnosis not present

## 2021-08-09 DIAGNOSIS — E1142 Type 2 diabetes mellitus with diabetic polyneuropathy: Secondary | ICD-10-CM | POA: Diagnosis not present

## 2021-08-09 DIAGNOSIS — I10 Essential (primary) hypertension: Secondary | ICD-10-CM | POA: Diagnosis not present

## 2021-08-11 ENCOUNTER — Telehealth: Payer: Self-pay | Admitting: Physician Assistant

## 2021-08-11 NOTE — Telephone Encounter (Signed)
Scheduled appt per 5/4 referral. Pt is aware of appt date and time. Pt is aware to arrive 15 mins prior to appt time and to bring and updated insurance card. Pt is aware of appt location.   ?

## 2021-08-16 ENCOUNTER — Ambulatory Visit (INDEPENDENT_AMBULATORY_CARE_PROVIDER_SITE_OTHER): Payer: Medicare HMO

## 2021-08-16 DIAGNOSIS — I495 Sick sinus syndrome: Secondary | ICD-10-CM

## 2021-08-17 LAB — CUP PACEART REMOTE DEVICE CHECK
Battery Impedance: 582 Ohm
Battery Remaining Longevity: 90 mo
Battery Voltage: 2.79 V
Brady Statistic AP VP Percent: 2 %
Brady Statistic AP VS Percent: 97 %
Brady Statistic AS VP Percent: 0 %
Brady Statistic AS VS Percent: 1 %
Date Time Interrogation Session: 20230506150556
Implantable Lead Implant Date: 20070706
Implantable Lead Implant Date: 20070706
Implantable Lead Location: 753859
Implantable Lead Location: 753860
Implantable Lead Model: 5076
Implantable Lead Model: 5092
Implantable Pulse Generator Implant Date: 20170113
Lead Channel Impedance Value: 417 Ohm
Lead Channel Impedance Value: 690 Ohm
Lead Channel Pacing Threshold Amplitude: 0.75 V
Lead Channel Pacing Threshold Amplitude: 1.5 V
Lead Channel Pacing Threshold Pulse Width: 0.4 ms
Lead Channel Pacing Threshold Pulse Width: 0.4 ms
Lead Channel Setting Pacing Amplitude: 1.5 V
Lead Channel Setting Pacing Amplitude: 3 V
Lead Channel Setting Pacing Pulse Width: 0.4 ms
Lead Channel Setting Sensing Sensitivity: 2 mV

## 2021-08-23 DIAGNOSIS — H35352 Cystoid macular degeneration, left eye: Secondary | ICD-10-CM | POA: Diagnosis not present

## 2021-08-23 DIAGNOSIS — H5703 Miosis: Secondary | ICD-10-CM | POA: Diagnosis not present

## 2021-08-23 DIAGNOSIS — H43813 Vitreous degeneration, bilateral: Secondary | ICD-10-CM | POA: Diagnosis not present

## 2021-08-23 DIAGNOSIS — H401234 Low-tension glaucoma, bilateral, indeterminate stage: Secondary | ICD-10-CM | POA: Diagnosis not present

## 2021-08-23 DIAGNOSIS — H3589 Other specified retinal disorders: Secondary | ICD-10-CM | POA: Diagnosis not present

## 2021-08-23 DIAGNOSIS — E119 Type 2 diabetes mellitus without complications: Secondary | ICD-10-CM | POA: Diagnosis not present

## 2021-08-23 DIAGNOSIS — Z7984 Long term (current) use of oral hypoglycemic drugs: Secondary | ICD-10-CM | POA: Diagnosis not present

## 2021-08-23 DIAGNOSIS — H34811 Central retinal vein occlusion, right eye, with macular edema: Secondary | ICD-10-CM | POA: Diagnosis not present

## 2021-08-23 DIAGNOSIS — H35372 Puckering of macula, left eye: Secondary | ICD-10-CM | POA: Diagnosis not present

## 2021-08-24 ENCOUNTER — Inpatient Hospital Stay: Payer: Medicare HMO | Attending: Physician Assistant | Admitting: Physician Assistant

## 2021-08-24 ENCOUNTER — Inpatient Hospital Stay: Payer: Medicare HMO

## 2021-08-24 ENCOUNTER — Other Ambulatory Visit: Payer: Self-pay

## 2021-08-24 ENCOUNTER — Encounter: Payer: Self-pay | Admitting: Physician Assistant

## 2021-08-24 VITALS — BP 145/73 | HR 91 | Temp 97.5°F | Resp 16 | Wt 173.2 lb

## 2021-08-24 DIAGNOSIS — R5382 Chronic fatigue, unspecified: Secondary | ICD-10-CM | POA: Insufficient documentation

## 2021-08-24 DIAGNOSIS — D72820 Lymphocytosis (symptomatic): Secondary | ICD-10-CM | POA: Diagnosis not present

## 2021-08-24 DIAGNOSIS — Z79899 Other long term (current) drug therapy: Secondary | ICD-10-CM | POA: Diagnosis not present

## 2021-08-24 DIAGNOSIS — Z87891 Personal history of nicotine dependence: Secondary | ICD-10-CM | POA: Diagnosis not present

## 2021-08-24 DIAGNOSIS — C9110 Chronic lymphocytic leukemia of B-cell type not having achieved remission: Secondary | ICD-10-CM | POA: Insufficient documentation

## 2021-08-24 LAB — CMP (CANCER CENTER ONLY)
ALT: 17 U/L (ref 0–44)
AST: 19 U/L (ref 15–41)
Albumin: 4.2 g/dL (ref 3.5–5.0)
Alkaline Phosphatase: 57 U/L (ref 38–126)
Anion gap: 7 (ref 5–15)
BUN: 22 mg/dL (ref 8–23)
CO2: 29 mmol/L (ref 22–32)
Calcium: 9.4 mg/dL (ref 8.9–10.3)
Chloride: 108 mmol/L (ref 98–111)
Creatinine: 1.01 mg/dL (ref 0.61–1.24)
GFR, Estimated: >60 mL/min (ref >60)
Glucose, Bld: 138 mg/dL — ABNORMAL HIGH (ref 70–99)
Potassium: 3.5 mmol/L (ref 3.5–5.1)
Sodium: 144 mmol/L (ref 135–145)
Total Bilirubin: 0.7 mg/dL (ref 0.3–1.2)
Total Protein: 7.1 g/dL (ref 6.5–8.1)

## 2021-08-24 LAB — CBC WITH DIFFERENTIAL (CANCER CENTER ONLY)
Abs Immature Granulocytes: 0.04 10*3/uL (ref 0.00–0.07)
Basophils Absolute: 0.1 10*3/uL (ref 0.0–0.1)
Basophils Relative: 0 %
Eosinophils Absolute: 0.2 10*3/uL (ref 0.0–0.5)
Eosinophils Relative: 1 %
HCT: 34.4 % — ABNORMAL LOW (ref 39.0–52.0)
Hemoglobin: 10.9 g/dL — ABNORMAL LOW (ref 13.0–17.0)
Immature Granulocytes: 0 %
Lymphocytes Relative: 69 %
Lymphs Abs: 10.4 10*3/uL — ABNORMAL HIGH (ref 0.7–4.0)
MCH: 26.4 pg (ref 26.0–34.0)
MCHC: 31.7 g/dL (ref 30.0–36.0)
MCV: 83.3 fL (ref 80.0–100.0)
Monocytes Absolute: 0.9 10*3/uL (ref 0.1–1.0)
Monocytes Relative: 6 %
Neutro Abs: 3.7 10*3/uL (ref 1.7–7.7)
Neutrophils Relative %: 24 %
Platelet Count: 162 10*3/uL (ref 150–400)
RBC: 4.13 MIL/uL — ABNORMAL LOW (ref 4.22–5.81)
RDW: 16 % — ABNORMAL HIGH (ref 11.5–15.5)
Smear Review: NORMAL
WBC Count: 15.2 10*3/uL — ABNORMAL HIGH (ref 4.0–10.5)
nRBC: 0 % (ref 0.0–0.2)

## 2021-08-24 LAB — C-REACTIVE PROTEIN: CRP: 0.6 mg/dL (ref <1.0)

## 2021-08-24 LAB — SAVE SMEAR(SSMR), FOR PROVIDER SLIDE REVIEW

## 2021-08-24 LAB — LACTATE DEHYDROGENASE: LDH: 125 U/L (ref 98–192)

## 2021-08-24 LAB — SEDIMENTATION RATE: Sed Rate: 5 mm/hr (ref 0–16)

## 2021-08-24 NOTE — Progress Notes (Signed)
[place] [phone]   Fax:([phone]) [place] NOTE  Patient Care Team: [name], MD as PCP - General (Internal Medicine) Lorretta Harp, MD as PCP - Cardiology (Cardiology) Croitoru, Dani Gobble, MD as PCP - Electrophysiology (Cardiology) Lorretta Harp, MD as Consulting Physician (Cardiology)  Hematological/Oncological History 1) 08/09/2021: Labs from PCP, Dr. [name]: --WBC 18.6, Hgb 11.7, MCV 80.4, Plt 164, Lymph 12.40.   2)  08/24/2021: Establish care with [facility] Hematology  CHIEF COMPLAINTS/PURPOSE OF CONSULTATION:  Lymphocytosis  HISTORY OF PRESENTING ILLNESS:  Robert Burns 81 y.o. male presents to the clinic for evaluation for leukocytosis/lymphocytosis. He is accompanied by his wife for this visit.   On exam today, Ms. Gerdeman reports that he has chronic fatigue that has been present for several years. He feels that most of his fatigue is age related. He has a good appetite and denies any recent weight changes. He denies nausea, vomiting or abdominal pain. He reports occasional episodes of loose stools but no evidence of diarrhea. He denies easy bruising or signs of active bleeding. He reports persistent, mild headache for the last several days. He denies any dizziness, vision changes or other focal deficits. He denies fevers, chills, night sweats, shortness of breath, chest pain or cough. He has no other complaints. Rest of the 10 point ROS is below.   MEDICAL HISTORY:  Past Medical History:  Diagnosis Date   Arthritis    "all over" (11/25/2015)   CAD (coronary artery disease)    a. s/p CABG  06/2002; b. 02/01/15 PCI: DES to prox SVG to PDA, staged PCI of SVG to Diag in 02/2015; c. 04/2017 Cath/PCI: LM nl, LAD 100ost, 61m 75d, LCX 60ost, OM2 80, RCA 100ost, RPDA 80, LIMA->LAD ok, VG->D1 patent stent, VG->RPDA patent stent, 50p, VG->OM1->OM2 90p (3.0x24 Synergy DES), 100 between OM1->OM2 (med rx).   Cancer ([facility]    Right  Shoulder, Left Leg- BCC, SCC, AND MELANOMA   Epithelioid hemangioendothelioma    s/p resection of left SFA/mass with interposition 6 mm GoreTex graft 06/02/10, s/p repeat resection for positive margins 09/22/10   Hyperlipidemia    Hypertension    Leg pain    OSA on CPAP    PAD (peripheral artery disease) ([place]    a. 10/2002 L SFA PTA/BMS; b. 8/17 LE Angio: LEIA 90 (9x40 self exp stent), LSFA short segment prox occlusion (staged PTA/stenting 01/03/2016), patent mid stent, RSFA 958mstaged PTA/DEB 02/14/2016).   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 s/p PPM in 2007 with gen change 04/2015 - Medtronic Adapta ADDRL1, ser # [id number].   Type II diabetes mellitus (HCC)    Type II   Urgency of urination     SURGICAL HISTORY: Past Surgical History:  Procedure Laterality Date   CARDIAC CATHETERIZATION N/A 02/01/2015   Procedure: Left Heart Cath and Coronary Angiography;  Surgeon: JoLorretta HarpMD;  [place] [place];  Service: Cardiovascular;  Laterality: N/A;   CARDIAC CATHETERIZATION N/A 02/01/2015   Procedure: Coronary Stent Intervention;  Surgeon: JoLorretta HarpMD;  [facility];  Service: Cardiovascular;  Laterality: N/A;   CARDIAC CATHETERIZATION  06/2002   "just before bypass OR"   CARDIAC CATHETERIZATION N/A 03/01/2015   Procedure: Coronary Stent Intervention;  Surgeon: JoLorretta HarpMD;  [place] [facility] [place];  Service: Cardiovascular;  Laterality: N/A;   CARDIAC CATHETERIZATION  02/06/2018   COLONOSCOPY     CORONARY ANGIOPLASTY     CORONARY ARTERY BYPASS GRAFT  06/2002   x5, LIMA-LAD;VG- Diag; seq VG- ramus & OM branch; VG-PDA   CORONARY STENT INTERVENTION N/A 04/12/2017   Procedure: CORONARY STENT INTERVENTION;  Surgeon: Lorretta Harp, MD;  Location: [place] CV LAB;  Service: Cardiovascular;  Laterality: N/A;   CORONARY STENT INTERVENTION N/A 02/08/2018   Procedure: CORONARY STENT  INTERVENTION;  Surgeon: Jettie Booze, MD;  Location: [place] CV LAB;  Service: Cardiovascular;  Laterality: N/A;   CORONARY STENT INTERVENTION N/A 11/12/2018   Procedure: CORONARY STENT INTERVENTION;  Surgeon: Wellington Hampshire, MD;  Location: [place] CV LAB;  Service: Cardiovascular;  Laterality: N/A;   CORONARY STENT INTERVENTION N/A 06/22/2020   Procedure: CORONARY STENT INTERVENTION;  Surgeon: Jettie Booze, MD;  Location: [place] CV LAB;  Service: Cardiovascular;  Laterality: N/A;   EP IMPLANTABLE DEVICE N/A 04/23/2015   Procedure: PPM Generator Changeout;  Surgeon: Sanda Klein, MD;  Location: [place] CV LAB;  Service: Cardiovascular;  Laterality: N/A;   FALSE ANEURYSM REPAIR Left 11/29/2018   Procedure: REPAIR FALSE ANEURYSM LEFT RADIAL ARTERY;  Surgeon: [name], MD;  Location: [place];  Service: Vascular;  Laterality: Left;   FEMORAL ARTERY STENT Left ~ 2014   "taken out of my leg; couldn' catorgorize what kind so the put it under all 3"; cataroziepitheloid hemanioendotheliomau   FOOT FRACTURE SURGERY Left [place] / REPLACE / REMOVE PACEMAKER  10/13/05   right side, medtronic Adapta   KNEE HARDWARE REMOVAL Right 1950's   "3-4 months after the insertion"   KNEE SURGERY Right 1950's   "broke my lower leg; had to put pin in my knee to keep lower leg in place til it healed"   LEFT HEART CATH AND CORS/GRAFTS ANGIOGRAPHY N/A 04/12/2017   Procedure: LEFT HEART CATH AND CORS/GRAFTS ANGIOGRAPHY;  Surgeon: Lorretta Harp, MD;  Location: [place] CV LAB;  Service: Cardiovascular;  Laterality: N/A;   LEFT HEART CATH AND CORS/GRAFTS ANGIOGRAPHY N/A 02/06/2018   Procedure: LEFT HEART CATH AND CORS/GRAFTS ANGIOGRAPHY;  Surgeon: Troy Sine, MD;  Location: [place] CV LAB;  Service: Cardiovascular;  Laterality: N/A;   LEFT HEART CATH AND CORS/GRAFTS ANGIOGRAPHY N/A 11/12/2018   Procedure: LEFT HEART CATH AND CORS/GRAFTS ANGIOGRAPHY;   Surgeon: Wellington Hampshire, MD;  Location: [place] CV LAB;  Service: Cardiovascular;  Laterality: N/A;   LEFT HEART CATH AND CORS/GRAFTS ANGIOGRAPHY N/A 06/22/2020   Procedure: LEFT HEART CATH AND CORS/GRAFTS ANGIOGRAPHY;  Surgeon: Jettie Booze, MD;  Location: [place] CV LAB;  Service: Cardiovascular;  Laterality: N/A;   PERIPHERAL VASCULAR CATHETERIZATION N/A 11/25/2015   Procedure: Lower Extremity Angiography;  Surgeon: Lorretta Harp, MD;  Location: [place] CV LAB;  Service: Cardiovascular;  Laterality: N/A;   PERIPHERAL VASCULAR CATHETERIZATION Left 11/25/2015   Procedure: Peripheral Vascular Intervention;  Surgeon: Lorretta Harp, MD;  Location: [place] CV LAB;  Service: Cardiovascular;  Laterality: Left;  external iliac   PERIPHERAL VASCULAR CATHETERIZATION N/A 01/03/2016   Procedure: Lower Extremity Angiography;  Surgeon: Lorretta Harp, MD;  Location: [place] CV LAB;  Service: Cardiovascular;  Laterality: N/A;   PERIPHERAL VASCULAR CATHETERIZATION Left 01/03/2016   Procedure: Peripheral Vascular Intervention;  Surgeon: Lorretta Harp, MD;  Location: [place] CV LAB;  Service: Cardiovascular;  Laterality: Left;  SFA   PERIPHERAL VASCULAR  CATHETERIZATION Right 02/14/2016   Procedure: Peripheral Vascular Atherectomy;  Surgeon: Lorretta Harp, MD;  Location: [place] CV LAB;  Service: Cardiovascular;  Laterality: Right;  SFA   POPLITEAL ARTERY STENT  01/03/2016   Contralateral access with a 7 [place] crossover sheath (second order catheter placement)   TONSILLECTOMY AND ADENOIDECTOMY     TUMOR EXCISION Right ~ 2005   cancerous tumor removed from shoulder   TUMOR EXCISION Right ~ 2000   benign tumor removed from under shoulder   TUMOR EXCISION Left 06/02/2010   resection of Lt SFA wth interposition of Gore-Tex graft    SOCIAL HISTORY: Social History   Socioeconomic History   Marital status: Married    Spouse name: Not on file   Number of children:  Not on file   Years of education: Not on file   Highest education level: Not on file  Occupational History   Not on file  Tobacco Use   Smoking status: Former    Types: Pipe    Quit date: 61    Years since quitting: 50.4   Smokeless tobacco: Never   Tobacco comments:    "quit smoking in 1973"  Vaping Use   Vaping Use: Never used  Substance and Sexual Activity   Alcohol use: No   Drug use: No   Sexual activity: Not Currently  Other Topics Concern   Not on file  Social History Narrative   Not on file   Social Determinants of Health   Financial Resource Strain: Not on file  Food Insecurity: Not on file  Transportation Needs: Not on file  Physical Activity: Not on file  Stress: Not on file  Social Connections: Not on file  Intimate Partner Violence: Not on file    FAMILY HISTORY: Family History  Problem Relation Age of Onset   Coronary artery disease Mother    Heart attack Mother    Hypertension Mother    Heart disease Mother        Open  Heart surgery   Diabetes Father    Heart disease Father    Hyperlipidemia Father    Heart attack Father    Hypertension Sister    Diabetes Sister    Heart disease Sister     ALLERGIES:  is allergic to peanut-containing drug products, ace inhibitors, diltiazem hcl, meloxicam, eliquis [apixaban], sertraline hcl, carvedilol, clonidine hcl, and duloxetine hcl.  MEDICATIONS:  Current Outpatient Medications  Medication Sig Dispense Refill   albuterol (VENTOLIN HFA) 108 (90 Base) MCG/ACT inhaler as needed.     amLODipine (NORVASC) 10 MG tablet Take 10 mg by mouth daily.     atorvastatin (LIPITOR) 80 MG tablet Take 1 tablet (80 mg total) by mouth daily at 6 PM. 90 tablet 1   Cholecalciferol (VITAMIN D3) 2000 units capsule Take 2,000 Units by mouth 2 (two) times daily.     clopidogrel (PLAVIX) 75 MG tablet Take 1 tablet (75 mg total) by mouth daily with breakfast. 90 tablet 3   dorzolamide-timolol (COSOPT) 22.3-6.8 MG/ML ophthalmic  solution Place 1 drop into both eyes 2 (two) times daily.     glipiZIDE (GLUCOTROL) 5 MG tablet Take 5 mg by mouth daily with breakfast.      hydrALAZINE (APRESOLINE) 25 MG tablet Take 1 tablet (25 mg total) by mouth as needed (for BP >150 sys or 90 dia).     hydrochlorothiazide (HYDRODIURIL) 25 MG tablet Take 25 mg by mouth every morning.     isosorbide mononitrate (IMDUR) 60  MG 24 hr tablet Take 1 tablet (60 mg total) by mouth daily. 90 tablet 1   ketorolac (ACULAR) 0.4 % SOLN Place 1 drop into both eyes 4 (four) times daily.     melatonin 5 MG TABS Take 5 mg by mouth at bedtime.     metFORMIN (GLUCOPHAGE) 1000 MG tablet Take 1 tablet (1,000 mg total) by mouth 2 (two) times daily with a meal. Restart on 3/18.     metoprolol tartrate (LOPRESSOR) 100 MG tablet Take 1 tablet (100 mg total) by mouth 2 (two) times daily. 180 tablet 3   MULTIPLE VITAMIN-FOLIC ACID PO Take 1 tablet by mouth daily.     nitroGLYCERIN (NITROSTAT) 0.4 MG SL tablet Place 1 tablet (0.4 mg total) under the tongue every 5 (five) minutes as needed for chest pain. 25 tablet 3   pantoprazole (PROTONIX) 20 MG tablet Take 20 mg by mouth daily.     potassium chloride SA (K-DUR,KLOR-CON) 20 MEQ tablet Take 1 tablet (20 mEq total) by mouth daily. 15 tablet 0   ranolazine (RANEXA) 1000 MG SR tablet Take 1 tablet (1,000 mg total) by mouth 2 (two) times daily. 180 tablet 3   rivaroxaban (XARELTO) 20 MG TABS tablet Take 1 tablet (20 mg total) by mouth daily with supper. 30 tablet 0   rOPINIRole (REQUIP) 1 MG tablet Take 1 mg by mouth at bedtime.     tamsulosin (FLOMAX) 0.4 MG CAPS capsule Take 0.4 mg by mouth 2 (two) times daily.      COVID-19 mRNA bivalent vaccine, Moderna, (MODERNA COVID-19 BIVAL BOOSTER) 50 MCG/0.5ML injection Inject into the muscle. (Patient not taking: Reported on 08/24/2021) 0.5 mL 0   MODERNA COVID-19 VACCINE 100 MCG/0.5ML injection  (Patient not taking: Reported on 08/24/2021)     neomycin-polymyxin-hydrocortisone  (CORTISPORIN) 3.5-10000-1 OTIC suspension Place 3 drops into both ears 3 (three) times daily. (Patient not taking: Reported on 08/24/2021) 7.5 mL 0   No current facility-administered medications for this visit.    REVIEW OF SYSTEMS:   Constitutional: ( - ) fevers, ( - )  chills , ( - ) night sweats Eyes: ( - ) blurriness of vision, ( - ) double vision, ( - ) watery eyes Ears, nose, mouth, throat, and face: ( - ) mucositis, ( - ) sore throat Respiratory: ( - ) cough, ( - ) dyspnea, ( - ) wheezes Cardiovascular: ( - ) palpitation, ( - ) chest discomfort, ( - ) lower extremity swelling Gastrointestinal:  ( - ) nausea, ( - ) heartburn, ( - ) change in bowel habits Skin: ( - ) abnormal skin rashes Lymphatics: ( - ) new lymphadenopathy, ( - ) easy bruising Neurological: ( - ) numbness, ( - ) tingling, ( - ) new weaknesses Behavioral/Psych: ( - ) mood change, ( - ) new changes  All other systems were reviewed with the patient and are negative.  PHYSICAL EXAMINATION: ECOG PERFORMANCE STATUS: 1 - Symptomatic but completely ambulatory  Vitals:   08/24/21 1342  BP: (!) 145/73  Pulse: 91  Resp: 16  Temp: (!) 97.5 F (36.4 C)  SpO2: 100%   Filed Weights   08/24/21 1342  Weight: 173 lb 3.2 oz (78.6 kg)    GENERAL: well appearing male in NAD  SKIN: skin color, texture, turgor are normal, no rashes or significant lesions EYES: conjunctiva are pink and non-injected, sclera clear OROPHARYNX: no exudate, no erythema; lips, buccal mucosa, and tongue normal  NECK: supple, non-tender LYMPH:  no palpable  lymphadenopathy in the cervical, axillary or supraclavicular lymph nodes.  LUNGS: clear to auscultation and percussion with normal breathing effort HEART: regular rate & rhythm and no murmurs and no lower extremity edema ABDOMEN: soft, non-tender, non-distended, normal bowel sounds Musculoskeletal: no cyanosis of digits and no clubbing  PSYCH: alert & oriented x 3, fluent speech NEURO: no focal  motor/sensory deficits  LABORATORY DATA:  I have reviewed the data as listed    Latest Ref Rng & Units 08/24/2021    2:42 PM 08/04/2020    3:40 PM 06/28/2020   11:26 AM  CBC  WBC 4.0 - 10.5 K/uL 15.2   12.8   17.3    Hemoglobin 13.0 - 17.0 g/dL 10.9   12.0   12.4    Hematocrit 39.0 - 52.0 % 34.4   37.9   38.1    Platelets 150 - 400 K/uL 162   185   171         Latest Ref Rng & Units 08/24/2021    2:42 PM 06/30/2021   11:20 AM 06/24/2020    2:50 AM  CMP  Glucose 70 - 99 mg/dL 138    131    BUN 8 - 23 mg/dL 22    21    Creatinine 0.61 - 1.24 mg/dL 1.01    1.08    Sodium 135 - 145 mmol/L 144    138    Potassium 3.5 - 5.1 mmol/L 3.5    4.0    Chloride 98 - 111 mmol/L 108    108    CO2 22 - 32 mmol/L 29    23    Calcium 8.9 - 10.3 mg/dL 9.4    8.8    Total Protein 6.5 - 8.1 g/dL 7.1   6.3     Total Bilirubin 0.3 - 1.2 mg/dL 0.7   0.3     Alkaline Phos 38 - 126 U/L 57   65     AST 15 - 41 U/L 19   19     ALT 0 - 44 U/L 17   15       RADIOGRAPHIC STUDIES: I have personally reviewed the radiological images as listed and agreed with the findings in the report. CT HEAD WO CONTRAST (5MM)  Result Date: 08/09/2021 CLINICAL DATA:  Frequent falls, headaches EXAM: CT HEAD WITHOUT CONTRAST TECHNIQUE: Contiguous axial images were obtained from the base of the skull through the vertex without intravenous contrast. RADIATION DOSE REDUCTION: This exam was performed according to the departmental dose-optimization program which includes automated exposure control, adjustment of the mA and/or kV according to patient size and/or use of iterative reconstruction technique. COMPARISON:  CT head 03/27/2019 FINDINGS: Brain: No acute intracranial hemorrhage, mass effect, or herniation. No extra-axial fluid collections. No evidence of acute territorial infarct. No hydrocephalus. Moderate cortical volume loss. Patchy hypodensities in the periventricular and subcortical white matter, likely secondary to chronic  microvascular ischemic changes. Vascular: Extensive calcified plaques in the carotid siphons. Skull: Normal. Negative for fracture or focal lesion. Sinuses/Orbits: No acute finding. Other: None. IMPRESSION: Chronic changes as described with no acute intracranial process identified. Electronically Signed   By: Ofilia Neas M.D.   On: 08/09/2021 15:39   CUP PACEART REMOTE DEVICE CHECK  Result Date: 08/17/2021 Scheduled remote reviewed. Normal device function.  Next remote 91 days- [place] KESHAN Burns is a 81 y.o. male who presents to the clinic for evaluation for lymphocytosis. I reviewed possible  etiologies including infectious process, inflammatory process and bone marrow disorders.Patient will proceed with serologic workup to day to check CBC, CMP, LDH, CRP, Sed rate and peripheral smear.   #Lymphocytosis:  --Labs today to check CBC, CMP, LDH, ESR, CRP, peripheral smear.  --No evidence of infectious symptoms. --No B symptoms including night sweats or weight loss.  --RTC based on above workup  Orders Placed This Encounter  Procedures   CBC with Differential ([place] Only)    Standing Status:   Future    Number of Occurrences:   1    Standing Expiration Date:   08/24/2022   CMP ([place] only)    Standing Status:   Future    Number of Occurrences:   1    Standing Expiration Date:   08/24/2022   Lactate dehydrogenase (LDH)    Standing Status:   Future    Number of Occurrences:   1    Standing Expiration Date:   08/23/2022   Sedimentation rate    Standing Status:   Future    Number of Occurrences:   1    Standing Expiration Date:   08/23/2022   C-reactive protein    Standing Status:   Future    Number of Occurrences:   1    Standing Expiration Date:   08/23/2022   Flow Cytometry, Peripheral Blood (Oncology)    Standing Status:   Future    Number of Occurrences:   1    Standing Expiration Date:   08/24/2022   Save Smear (SSMR)    Standing Status:    Future    Number of Occurrences:   1    Standing Expiration Date:   08/25/2022    All questions were answered. The patient knows to call the clinic with any problems, questions or concerns.  I have spent a total of 60 minutes minutes of face-to-face and non-face-to-face time, preparing to see the patient, obtaining and/or reviewing separately obtained history, performing a medically appropriate examination, counseling and educating the patient, ordering tests/procedures, referring and communicating with other health care professionals, documenting clinical information in the electronic health record, and care coordination.   Dede Query, PA-C Department of Hematology/Oncology [place] [facility] Phone: [phone]  Patient was seen with Dr. Lorenso Courier  I have read the above note and personally examined the patient. I agree with the assessment and plan as noted above.  Briefly Robert Burns is an 81 year old male who presents for evaluation of lymphocytosis.  At this time findings are most concerning for CLL, though other lymphomas could present similarly.  Would recommend full work-up to include CBC, CMP, LDH, and inflammatory markers.  Additionally we will order flow cytometry in order to assess for markers of CLL.  In the event the patient was found to have CLL would need full prognostic work-up as well to include IGHV, Zap 70, and CLL FISH prognostic panel.   Ledell Peoples, MD Department of Hematology/Oncology [place] at [facility] Phone: [phone] Pager: [phone] Email: [email or web address]

## 2021-08-25 ENCOUNTER — Other Ambulatory Visit: Payer: Self-pay | Admitting: Physician Assistant

## 2021-08-25 DIAGNOSIS — D72820 Lymphocytosis (symptomatic): Secondary | ICD-10-CM

## 2021-08-25 LAB — SURGICAL PATHOLOGY

## 2021-08-26 ENCOUNTER — Encounter: Payer: Self-pay | Admitting: Hematology and Oncology

## 2021-08-26 ENCOUNTER — Other Ambulatory Visit: Payer: Self-pay

## 2021-08-26 ENCOUNTER — Inpatient Hospital Stay: Payer: Medicare HMO

## 2021-08-26 DIAGNOSIS — C9110 Chronic lymphocytic leukemia of B-cell type not having achieved remission: Secondary | ICD-10-CM | POA: Diagnosis not present

## 2021-08-26 DIAGNOSIS — R5382 Chronic fatigue, unspecified: Secondary | ICD-10-CM | POA: Diagnosis not present

## 2021-08-26 DIAGNOSIS — D72820 Lymphocytosis (symptomatic): Secondary | ICD-10-CM | POA: Diagnosis not present

## 2021-08-26 DIAGNOSIS — Z79899 Other long term (current) drug therapy: Secondary | ICD-10-CM | POA: Diagnosis not present

## 2021-08-26 DIAGNOSIS — Z87891 Personal history of nicotine dependence: Secondary | ICD-10-CM | POA: Diagnosis not present

## 2021-08-26 LAB — FLOW CYTOMETRY

## 2021-08-30 NOTE — Progress Notes (Signed)
Remote pacemaker transmission.   

## 2021-09-01 ENCOUNTER — Encounter: Payer: Self-pay | Admitting: Rehabilitative and Restorative Service Providers"

## 2021-09-01 ENCOUNTER — Ambulatory Visit: Payer: Medicare HMO | Attending: Internal Medicine | Admitting: Rehabilitative and Restorative Service Providers"

## 2021-09-01 DIAGNOSIS — R293 Abnormal posture: Secondary | ICD-10-CM | POA: Diagnosis not present

## 2021-09-01 DIAGNOSIS — R29898 Other symptoms and signs involving the musculoskeletal system: Secondary | ICD-10-CM | POA: Insufficient documentation

## 2021-09-01 DIAGNOSIS — Z5189 Encounter for other specified aftercare: Secondary | ICD-10-CM | POA: Diagnosis not present

## 2021-09-01 DIAGNOSIS — G44221 Chronic tension-type headache, intractable: Secondary | ICD-10-CM | POA: Diagnosis not present

## 2021-09-01 DIAGNOSIS — M542 Cervicalgia: Secondary | ICD-10-CM | POA: Insufficient documentation

## 2021-09-01 LAB — FISH,CLL PROGNOSTIC PANEL
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
## 2021-09-01 NOTE — Patient Instructions (Signed)
Access Code: [facility] URL: [email or web address] Date: 09/01/2021 Prepared by: Gillermo Murdoch  Exercises - Seated Cervical Retraction  - 3 x daily - 7 x weekly - 1 sets - 10 reps - Standing Scapular Retraction  - 3 x daily - 7 x weekly - 1 sets - 10 reps - 10 hold - Shoulder External Rotation and Scapular Retraction  - 3 x daily - 7 x weekly - 1 sets - 10 reps -   hold - Shoulder External Rotation in 45 Degrees Abduction  - 2 x daily - 7 x weekly - 1-2 sets - 10 reps - 30 sec  hold - Doorway Pec Stretch at 60 Degrees Abduction  - 3 x daily - 7 x weekly - 1 sets - 3 reps - Doorway Pec Stretch at 90 Degrees Abduction  - 3 x daily - 7 x weekly - 1 sets - 3 reps - 30 seconds  hold - Doorway Pec Stretch at 120 Degrees Abduction  - 3 x daily - 7 x weekly - 1 sets - 3 reps - 30 second hold  hold  Patient Education - Trigger Point Dry Needling

## 2021-09-02 ENCOUNTER — Other Ambulatory Visit: Payer: Self-pay

## 2021-09-02 ENCOUNTER — Inpatient Hospital Stay: Payer: Medicare HMO | Admitting: Hematology and Oncology

## 2021-09-02 VITALS — BP 148/65 | HR 88 | Temp 97.6°F | Resp 18 | Ht 67.0 in | Wt 172.1 lb

## 2021-09-02 DIAGNOSIS — Z87891 Personal history of nicotine dependence: Secondary | ICD-10-CM | POA: Diagnosis not present

## 2021-09-02 DIAGNOSIS — Z79899 Other long term (current) drug therapy: Secondary | ICD-10-CM | POA: Diagnosis not present

## 2021-09-02 DIAGNOSIS — C9110 Chronic lymphocytic leukemia of B-cell type not having achieved remission: Secondary | ICD-10-CM | POA: Diagnosis not present

## 2021-09-02 DIAGNOSIS — R5382 Chronic fatigue, unspecified: Secondary | ICD-10-CM | POA: Diagnosis not present

## 2021-09-02 DIAGNOSIS — D72820 Lymphocytosis (symptomatic): Secondary | ICD-10-CM | POA: Diagnosis not present

## 2021-09-02 LAB — IGVH SOMATIC HYPERMUTATION

## 2021-09-05 NOTE — Progress Notes (Unsigned)
Cardiology Office Note:    Date:  09/07/2021   ID:  [name], DOB 30-Sep-1940, MRN [id number]  PCP:  [name], MD   [facility] Providers Cardiologist:  Quay Burow, MD Cardiology APP:  Ledora Bottcher, [place]  Electrophysiologist:  Sanda Klein, MD { Referring MD: [name], MD   Chief Complaint  Patient presents with   Follow-up  CAD  History of Present Illness:    Robert Burns is a 81 y.o. male with a hx of CAD s/p CABG 06/2002: LIMA-LAD, SVG-diagonal, SVG-ramus-OM, SVG to PDA.  Functional study for 2013 was normal.  PAD s/p left SFA PTA and stenting 10/30/2002. PAF on device.  SSS s/p PPM 02/2007, OSA on CPAP, and CKD.  Ongoing chest pain led to a nuclear stress test in 2016 that was high risk.  He underwent left heart catheterization 02/01/2015 with stenting of the SVG-RCA and DES to SVG-diagonal.  Heart catheterization in 01/2018 for chest pain showed disease in all 3 vein grafts as well as LAD beyond the LIMA insertion.  He underwent complex intervention on his diagonal and ramus branch vein grafts with restenting as well as intervention on his LAD via LIMA insertion.  SVG-RCA was not intervened on.  Repeat heart catheterization 06/22/2020 showed patent stent in the SVG to PDA but in-stent restenosis in the SVG to diagonal.  Scoring balloon and stenting to ISR of the SVG-OM.  He continued to have chest pain and underwent repeat nuclear stress test 11/12/2020 that showed normal perfusion.  He underwent left external iliac stenting and right SFA atherectomy in 2017.  A significant PAF noted on device interrogation.  He is now anticoagulated with Xarelto.  He was last seen by Dr. Gwenlyn Found in the clinic 06/30/2021. Since then , he has been diagnosed with CLL with hematology. He was told this should not give him problems in the near future, will be slow to progress. He continues to report tightness in his shoulders and headache. He is now doing PT to lessen tension in  neck and lessen headaches. He has been in imdur chronically, so I am not sure this is contributory. He states this chest and shoulder discomfort is not like his prior angina.    Past Medical History:  Diagnosis Date   Arthritis    "all over" (11/25/2015)   CAD (coronary artery disease)    a. s/p CABG  06/2002; b. 02/01/15 PCI: DES to prox SVG to PDA, staged PCI of SVG to Diag in 02/2015; c. 04/2017 Cath/PCI: LM nl, LAD 100ost, 59m 75d, LCX 60ost, OM2 80, RCA 100ost, RPDA 80, LIMA->LAD ok, VG->D1 patent stent, VG->RPDA patent stent, 50p, VG->OM1->OM2 90p (3.0x24 Synergy DES), 100 between OM1->OM2 (med rx).   Cancer ([facility]    Right Shoulder, Left Leg- BCC, SCC, AND MELANOMA   Epithelioid hemangioendothelioma    s/p resection of left SFA/mass with interposition 6 mm GoreTex graft 06/02/10, s/p repeat resection for positive margins 09/22/10   Hyperlipidemia    Hypertension    Leg pain    OSA on CPAP    PAD [place]ipheral artery disease) [place]    a. 10/2002 L SFA PTA/BMS; b. 8/17 LE Angio: LEIA 90 (9x40 self exp stent), LSFA short segment prox occlusion (staged PTA/stenting 01/03/2016), patent mid stent, RSFA 967mstaged PTA/DEB 02/14/2016).   Presence of permanent cardiac pacemaker    Medtronic   [place]   a. s/p PPM in 2007 with gen change 04/2015 - Medtronic  Adapta ADDRL1, ser # [id number] H.   Type II diabetes mellitus (HCC)    Type II   Urgency of urination     Past Surgical History:  Procedure Laterality Date   CARDIAC CATHETERIZATION N/A 02/01/2015   Procedure: Left Heart Cath and Coronary Angiography;  Surgeon: Lorretta Harp, MD;  Location: [place] [facility];  Service: Cardiovascular;  Laterality: N/A;   CARDIAC CATHETERIZATION N/A 02/01/2015   Procedure: Coronary Stent Intervention;  Surgeon: Lorretta Harp, MD;  Location: [place] [facility];  Service: Cardiovascular;  Laterality: N/A;   CARDIAC CATHETERIZATION  06/2002   "just before bypass OR"   CARDIAC  CATHETERIZATION N/A 03/01/2015   Procedure: Coronary Stent Intervention;  Surgeon: Lorretta Harp, MD;  Location: [place] [facility];  Service: Cardiovascular;  Laterality: N/A;   CARDIAC CATHETERIZATION  02/06/2018   COLONOSCOPY     CORONARY ANGIOPLASTY     CORONARY ARTERY BYPASS GRAFT  06/2002   x5, LIMA-LAD;VG- Diag; seq VG- ramus & OM branch; VG-PDA   CORONARY STENT INTERVENTION N/A 04/12/2017   Procedure: CORONARY STENT INTERVENTION;  Surgeon: Lorretta Harp, MD;  Location: [place] [facility];  Service: Cardiovascular;  Laterality: N/A;   CORONARY STENT INTERVENTION N/A 02/08/2018   Procedure: CORONARY STENT INTERVENTION;  Surgeon: Jettie Booze, MD;  Location: [place] [facility];  Service: Cardiovascular;  Laterality: N/A;   CORONARY STENT INTERVENTION N/A 11/12/2018   Procedure: CORONARY STENT INTERVENTION;  Surgeon: Wellington Hampshire, MD;  Location: [place] [facility];  Service: Cardiovascular;  Laterality: N/A;   CORONARY STENT INTERVENTION N/A 06/22/2020   Procedure: CORONARY STENT INTERVENTION;  Surgeon: Jettie Booze, MD;  Location: [place] [facility];  Service: Cardiovascular;  Laterality: N/A;   EP IMPLANTABLE DEVICE N/A 04/23/2015   Procedure: PPM Generator Changeout;  Surgeon: Sanda Klein, MD;  Location: [place] [facility];  Service: Cardiovascular;  Laterality: N/A;   FALSE ANEURYSM REPAIR Left 11/29/2018   Procedure: REPAIR FALSE ANEURYSM LEFT RADIAL ARTERY;  Surgeon: [name], MD;  Location: [place];  Service: Vascular;  Laterality: Left;   FEMORAL ARTERY STENT Left ~ 2014   "taken out of my leg; couldn' catorgorize what kind so the put it under all 3"; cataroziepitheloid hemanioendotheliomau   FOOT FRACTURE SURGERY Left [place] / REPLACE / REMOVE PACEMAKER  10/13/05   right side, medtronic Adapta   KNEE HARDWARE REMOVAL Right 1950's   "3-4 months after the insertion"   KNEE SURGERY Right 1950's   "broke my lower leg; had to put  pin in my knee to keep lower leg in place til it healed"   LEFT HEART CATH AND CORS/GRAFTS ANGIOGRAPHY N/A 04/12/2017   Procedure: LEFT HEART CATH AND CORS/GRAFTS ANGIOGRAPHY;  Surgeon: Lorretta Harp, MD;  Location: [place] [facility];  Service: Cardiovascular;  Laterality: N/A;   LEFT HEART CATH AND CORS/GRAFTS ANGIOGRAPHY N/A 02/06/2018   Procedure: LEFT HEART CATH AND CORS/GRAFTS ANGIOGRAPHY;  Surgeon: Troy Sine, MD;  Location: [place] [facility];  Service: Cardiovascular;  Laterality: N/A;   LEFT HEART CATH AND CORS/GRAFTS ANGIOGRAPHY N/A 11/12/2018   Procedure: LEFT HEART CATH AND CORS/GRAFTS ANGIOGRAPHY;  Surgeon: Wellington Hampshire, MD;  Location: [place] [facility];  Service: Cardiovascular;  Laterality: N/A;   LEFT HEART CATH AND CORS/GRAFTS ANGIOGRAPHY N/A 06/22/2020   Procedure: LEFT HEART CATH AND CORS/GRAFTS ANGIOGRAPHY;  Surgeon: Jettie Booze, MD;  Location:  [place] [facility];  Service: Cardiovascular;  Laterality: N/A;   PERIPHERAL VASCULAR CATHETERIZATION N/A 11/25/2015   Procedure: Lower Extremity Angiography;  Surgeon: Lorretta Harp, MD;  Location: [place] [facility];  Service: Cardiovascular;  Laterality: N/A;   PERIPHERAL VASCULAR CATHETERIZATION Left 11/25/2015   Procedure: Peripheral Vascular Intervention;  Surgeon: Lorretta Harp, MD;  Location: [place] [facility];  Service: Cardiovascular;  Laterality: Left;  external iliac   PERIPHERAL VASCULAR CATHETERIZATION N/A 01/03/2016   Procedure: Lower Extremity Angiography;  Surgeon: Lorretta Harp, MD;  Location: [place] [facility];  Service: Cardiovascular;  Laterality: N/A;   PERIPHERAL VASCULAR CATHETERIZATION Left 01/03/2016   Procedure: Peripheral Vascular Intervention;  Surgeon: Lorretta Harp, MD;  Location: [place] [facility];  Service: Cardiovascular;  Laterality: Left;  SFA   PERIPHERAL VASCULAR CATHETERIZATION Right 02/14/2016   Procedure: Peripheral Vascular Atherectomy;  Surgeon: Lorretta Harp, MD;   Location: [place] [facility];  Service: Cardiovascular;  Laterality: Right;  SFA   POPLITEAL ARTERY STENT  01/03/2016   Contralateral access with a 7 [place] crossover sheath (second order catheter placement)   TONSILLECTOMY AND ADENOIDECTOMY     TUMOR EXCISION Right ~ 2005   cancerous tumor removed from shoulder   TUMOR EXCISION Right ~ 2000   benign tumor removed from under shoulder   TUMOR EXCISION Left 06/02/2010   resection of Lt SFA wth interposition of Gore-Tex graft    Current Medications: Current Meds  Medication Sig   albuterol (VENTOLIN HFA) 108 (90 Base) MCG/ACT inhaler as needed.   amLODipine (NORVASC) 10 MG tablet Take 10 mg by mouth daily.   atorvastatin (LIPITOR) 80 MG tablet Take 1 tablet (80 mg total) by mouth daily at 6 PM.   clopidogrel (PLAVIX) 75 MG tablet Take 1 tablet (75 mg total) by mouth daily with breakfast.   dorzolamide-timolol (COSOPT) 22.3-6.8 MG/ML ophthalmic solution Place 1 drop into both eyes 2 (two) times daily.   glipiZIDE (GLUCOTROL) 5 MG tablet Take 5 mg by mouth daily with breakfast.    hydrALAZINE (APRESOLINE) 25 MG tablet Take 1 tablet (25 mg total) by mouth as needed (for BP >150 sys or 90 dia).   hydrochlorothiazide (HYDRODIURIL) 25 MG tablet Take 25 mg by mouth every morning.   isosorbide mononitrate (IMDUR) 60 MG 24 hr tablet Take 1 tablet (60 mg total) by mouth daily.   ketorolac (ACULAR) 0.4 % SOLN Place 1 drop into both eyes 4 (four) times daily.   melatonin 5 MG TABS Take 5 mg by mouth at bedtime.   metFORMIN (GLUCOPHAGE) 1000 MG tablet Take 1 tablet (1,000 mg total) by mouth 2 (two) times daily with a meal. Restart on 3/18.   metoprolol tartrate (LOPRESSOR) 100 MG tablet Take 1 tablet (100 mg total) by mouth 2 (two) times daily.   MULTIPLE VITAMIN-FOLIC ACID PO Take 1 tablet by mouth daily.   nitroGLYCERIN (NITROSTAT) 0.4 MG SL tablet Place 1 tablet (0.4 mg total) under the tongue every 5 (five) minutes as needed for chest pain.    pantoprazole (PROTONIX) 20 MG tablet Take 20 mg by mouth daily.   potassium chloride SA (K-DUR,KLOR-CON) 20 MEQ tablet Take 1 tablet (20 mEq total) by mouth daily.   ranolazine (RANEXA) 1000 MG SR tablet Take 1 tablet (1,000 mg total) by mouth 2 (two) times daily.   rivaroxaban (XARELTO) 20 MG TABS tablet Take 1 tablet (20 mg total) by mouth daily with supper.   rOPINIRole (REQUIP) 1 MG  tablet Take 1 mg by mouth at bedtime.   tamsulosin (FLOMAX) 0.4 MG CAPS capsule Take 0.4 mg by mouth 2 (two) times daily.      Allergies:   Peanut-containing drug products, Ace inhibitors, Diltiazem hcl, Meloxicam, Eliquis [apixaban], Sertraline hcl, Carvedilol, Clonidine hcl, and Duloxetine hcl   Social History   Socioeconomic History   Marital status: Married    Spouse name: Not on file   Number of children: Not on file   Years of education: Not on file   Highest education level: Not on file  Occupational History   Not on file  Tobacco Use   Smoking status: Former    Types: Pipe    Quit date: 21    Years since quitting: 50.4   Smokeless tobacco: Never   Tobacco comments:    "quit smoking in 1973"  Vaping Use   Vaping Use: Never used  Substance and Sexual Activity   Alcohol use: No   Drug use: No   Sexual activity: Not Currently  Other Topics Concern   Not on file  Social History Narrative   Not on file   Social Determinants of Health   Financial Resource Strain: Not on file  Food Insecurity: Not on file  Transportation Needs: Not on file  Physical Activity: Not on file  Stress: Not on file  Social Connections: Not on file     Family History: The patient's family history includes Coronary artery disease in his mother; Diabetes in his father and sister; Heart attack in his father and mother; Heart disease in his father, mother, and sister; Hyperlipidemia in his father; Hypertension in his mother and sister.  ROS:   Please see the history of present illness.     All other systems  reviewed and are negative.  EKGs/Labs/Other Studies Reviewed:    The following studies were reviewed today:  Nuclear stress test 11/12/20: There was no ST segment deviation noted during stress. The left ventricular ejection fraction is moderately decreased (30-44%). Nuclear stress EF: 37%. Defect 1: There is a small defect of mild severity present in the basal inferior and mid inferior location. Findings consistent with prior myocardial infarction with peri-infarct ischemia. This is an intermediate risk study.   1. Partially reversible basal to mid inferior perfusion defect, consistent with infarct with peri-infarct ischemia. 2. Intermediate risk study due to moderate systolic dysfunction (62%).  Amount of ischemia is small   Left heart cath 06/22/20: Mid LM lesion is 50% stenosed. Ost LAD to Prox LAD lesion is 100% stenosed. LIMA to LAD is patent. Distal LAD stent appears better compared to prior study in 2020. Ost Cx lesion is 40% stenosed. Prox Cx lesion is 30% stenosed. Ost 3rd Mrg to 3rd Mrg lesion is 99% stenosed. SVG to OM is occluded. Mid Cx lesion is 75% stenosed. Ost Ramus lesion is 95% stenosed. Areas of in stent restenosis in the SVG to diag graft: Prox Graft lesion is 70% stenosed, Mid Graft to Dist Graft lesion is 70% stenosed. Scoring balloon angioplasty was performed using a BALLOON WOLVERINE 3.00X10. Post intervention, there is a 0% residual stenosis in the distal stent. Post intervention, there is a 30% residual stenosis in the proximal stent. SVG to diagonal Mid Graft lesion is 90% stenosed. A drug-eluting stent was successfully placed using a STENT RESOLUTE ONYX 3.0X12. IC verapamil given prior to stent placement. Post intervention, there is a 0% residual stenosis. Ost RCA to Prox RCA lesion is 100% stenosed. Origin to Prox SVG  to PDA Graft lesion is 80% stenosed and scoring Balloon angioplasty was performed, with a 3.25 [place] cutting balloon. Post intervention,  there is a 25% residual stenosis. The left ventricular systolic function is normal. LV end diastolic pressure is normal. The left ventricular ejection fraction is 50-55% by visual estimate. There is no aortic valve stenosis.   Successful PCI to the mid SVG to diagonal graft and PTCA to areas of restenosis in this graft.    PTCA to the proximal SVG to RCA in stent restenosis.  Severe calcium.  If he has recurrent restenosis, consider Shockwave IVL.    Plavix for 6 months.  Restart Xarelto in AM.         EKG:  EKG is not ordered today.   Recent Labs: 08/24/2021: ALT 17; BUN 22; Creatinine 1.01; Hemoglobin 10.9; Platelet Count 162; Potassium 3.5; Sodium 144  Recent Lipid Panel    Component Value Date/Time   CHOL 88 (L) 06/30/2021 1120   TRIG 71 06/30/2021 1120   HDL 35 (L) 06/30/2021 1120   CHOLHDL 2.5 06/30/2021 1120   CHOLHDL 2.6 06/23/2020 0325   VLDL 14 06/23/2020 0325   LDLCALC 38 06/30/2021 1120     Risk Assessment/Calculations:           Physical Exam:    VS:  BP 124/76   Pulse 93   Ht '5\' 7"'$  (1.702 m)   Wt 176 lb (79.8 kg)   SpO2 98%   BMI 27.57 kg/m     Wt Readings from Last 3 Encounters:  09/07/21 176 lb (79.8 kg)  09/02/21 172 lb 1.6 oz (78.1 kg)  08/24/21 173 lb 3.2 oz (78.6 kg)     GEN:  Well nourished, well developed in no acute distress HEENT: Normal NECK: No JVD; No carotid bruits LYMPHATICS: No lymphadenopathy CARDIAC: RRR, no murmurs, rubs, gallops RESPIRATORY:  Clear to auscultation without rales, wheezing or rhonchi  ABDOMEN: Soft, non-tender, non-distended MUSCULOSKELETAL:  No edema; No deformity  SKIN: Warm and dry NEUROLOGIC:  Alert and oriented x 3 PSYCHIATRIC:  Normal affect   ASSESSMENT:    1. Chest pain, atypical   2. Paroxysmal atrial fibrillation (HCC)   3. Chronic anticoagulation   4. CAD, multiple vessel   5. Hx of CABG   6. Hypercholesterolemia   7. Essential hypertension   8. SSS (sick sinus syndrome), medtronic  adapta    PLAN:    In order of problems listed above:  Shoulder tightness, upper chest tightness Lasts less than 5 minutes about twice per day, has not taken nitro, not similar to prior angina leading to PCI Reviewed recent reassuring myoview   PAF Noted on device interrogation - less than 0.1%  No palpitations, no syncope.   Chronic anticoagulation Eliquis was transitioned to Xarelto No bleeding issues.   CAD s/p CABG Multiple subsequent PCI's Focus on risk factor modification He is on maximum antianginal medications including nitrates, beta-blocker, amlodipine, and hydralazine PRN as well as Ranexa - he reports his chest pain that occurs twice daily is different than his prior angina - he gets tightness across both shoulder but no radiation to his substernal area   Hyperlipidemia with LDL goal less than 70 06/30/2021: Cholesterol, Total 88; HDL 35; LDL Chol Calc (NIH) 38; Triglycerides 71 No change in therapy   Hypertension No change in medications. BP well controlled.   SSS s/p PPM Followed by Dr. Sallyanne Kuster   Follow up with Dr. Gwenlyn Found as scheduled.     Medication Adjustments/Labs  and Tests Ordered: Current medicines are reviewed at length with the patient today.  Concerns regarding medicines are outlined above.  No orders of the defined types were placed in this encounter.  No orders of the defined types were placed in this encounter.   Patient Instructions  Medication Instructions:  Your physician recommends that you continue on your current medications as directed. Please refer to the Current Medication list given to you today.  *If you need a refill on your cardiac medications before your next appointment, please call your pharmacy*   Lab Work: NONE ordered at this time of appointment   If you have labs (blood work) drawn today and your tests are completely normal, you will receive your results only by: [place] (if you have MyChart) OR A  paper copy in the mail If you have any lab test that is abnormal or we need to change your treatment, we will call you to review the results.   Testing/Procedures: NONE ordered at this time of appointment     Follow-Up: At [facility], you and your health needs are our priority.  As part of our continuing mission to provide you with exceptional heart care, we have created designated Provider Care Teams.  These Care Teams include your primary Cardiologist (physician) and Advanced Practice Providers (APPs -  Physician Assistants and Nurse Practitioners) who all work together to provide you with the care you need, when you need it.  We recommend signing up for the patient portal called "MyChart".  Sign up information is provided on this After Visit Summary.  MyChart is used to connect with patients for Virtual Visits (Telemedicine).  Patients are able to view lab/test results, encounter notes, upcoming appointments, etc.  Non-urgent messages can be sent to your provider as well.   To learn more about what you can do with MyChart, go to [email or web address].    Your next appointment:   As previously scheduled   The format for your next appointment:   In Person  Provider:   Quay Burow, MD     Other Instructions   Important Information About Sugar         Signed, Ledora Bottcher, [place]  09/07/2021 3:16 PM    [place]

## 2021-09-06 NOTE — Progress Notes (Signed)
[name]   [phone]  PROGRESS NOTE  Patient Care Team: [name], MD as PCP - General (Internal Medicine) Lorretta Harp, MD as PCP - Cardiology (Cardiology) Croitoru, Dani Gobble, MD as PCP - Electrophysiology (Cardiology) Lorretta Harp, MD as Consulting Physician (Cardiology)  Hematological/Oncological History # CLL Rai Stage 0 1) 08/09/2021: Labs from PCP, Dr. [name]: --WBC 18.6, Hgb 11.7, MCV 80.4, Plt 164, Lymph 12.40.  2)  08/24/2021: Establish care with [facility].  Flow cytometry results most consistent with CLL  Interval History:  Robert Burns 81 y.o. Burns with medical history significant for newly diagnosed CLL who presents for a follow up visit. The patient's last visit was on 08/24/2021. In the interim since the last visit testing has confirmed diagnosis of CLL  On exam today Robert Burns is accompanied by his family.  He reports he has been well in the interim since her last visit.  Has had no new symptoms such as fevers, chills, sweats, nausea, vomiting or diarrhea.  A full 10 point ROS is listed below.  The bulk of our discussion focused on the diagnosis of CLL and the plan moving forward.  The details of this conversation are noted below.  MEDICAL HISTORY:  Past Medical History:  Diagnosis Date   Arthritis    "all over" (11/25/2015)   CAD (coronary artery disease)    a. s/p CABG  06/2002; b. 02/01/15 PCI: DES to prox SVG to PDA, staged PCI of SVG to Diag in 02/2015; c. 04/2017 Cath/PCI: LM nl, LAD 100ost, 72m 75d, LCX 60ost, OM2 80, RCA 100ost, RPDA 80, LIMA->LAD ok, VG->D1 patent stent, VG->RPDA patent stent, 50p, VG->OM1->OM2 90p (3.0x24 Synergy DES), 100 between OM1->OM2 (med rx).   Cancer ([facility]    Right Shoulder, Left Leg- BCC, SCC, AND MELANOMA   Epithelioid hemangioendothelioma    s/p resection of left SFA/mass with interposition 6 mm GoreTex graft 06/02/10, s/p repeat resection for positive margins  09/22/10   Hyperlipidemia    Hypertension    Leg pain    OSA on CPAP    PAD (peripheral artery disease) ([facility]    a. 10/2002 L SFA PTA/BMS; b. 8/17 LE Angio: LEIA 90 (9x40 self exp stent), LSFA short segment prox occlusion (staged PTA/stenting 01/03/2016), patent mid stent, RSFA 964mstaged PTA/DEB 02/14/2016).   Presence of permanent cardiac pacemaker    Medtronic   SSS (sick sinus syndrome) ([facility]   a. s/p PPM in 2007 with gen change 04/2015 - Medtronic Adapta ADDRL1, ser # [id number].   Type II diabetes mellitus (HCC)    Type II   Urgency of urination     SURGICAL HISTORY: Past Surgical History:  Procedure Laterality Date   CARDIAC CATHETERIZATION N/A 02/01/2015   Procedure: Left Heart Cath and Coronary Angiography;  Surgeon: JoLorretta HarpMD;  [facility];  Service: Cardiovascular;  Laterality: N/A;   CARDIAC CATHETERIZATION N/A 02/01/2015   Procedure: Coronary Stent Intervention;  Surgeon: JoLorretta HarpMD;  [facility];  Service: Cardiovascular;  Laterality: N/A;   CARDIAC CATHETERIZATION  06/2002   "just before bypass OR"   CARDIAC CATHETERIZATION N/A 03/01/2015   Procedure: Coronary Stent Intervention;  Surgeon: JoLorretta HarpMD;  [facility];  Service: Cardiovascular;  Laterality: N/A;   CARDIAC CATHETERIZATION  02/06/2018   COLONOSCOPY     CORONARY ANGIOPLASTY     CORONARY ARTERY BYPASS GRAFT  06/2002  x5, LIMA-LAD;VG- Diag; seq VG- ramus & OM branch; VG-PDA   CORONARY STENT INTERVENTION N/A 04/12/2017   Procedure: CORONARY STENT INTERVENTION;  Surgeon: Lorretta Harp, MD;  Location: [place] [facility];  Service: Cardiovascular;  Laterality: N/A;   CORONARY STENT INTERVENTION N/A 02/08/2018   Procedure: CORONARY STENT INTERVENTION;  Surgeon: Jettie Booze, MD;  Location: [place] [facility];  Service: Cardiovascular;  Laterality: N/A;   CORONARY STENT INTERVENTION N/A 11/12/2018   Procedure: CORONARY STENT  INTERVENTION;  Surgeon: Wellington Hampshire, MD;  Location: [place] [facility] LAB;  Service: Cardiovascular;  Laterality: N/A;   CORONARY STENT INTERVENTION N/A 06/22/2020   Procedure: CORONARY STENT INTERVENTION;  Surgeon: Jettie Booze, MD;  Location: [place] [facility];  Service: Cardiovascular;  Laterality: N/A;   EP IMPLANTABLE DEVICE N/A 04/23/2015   Procedure: PPM Generator Changeout;  Surgeon: Sanda Klein, MD;  Location: [place] [facility] LAB;  Service: Cardiovascular;  Laterality: N/A;   FALSE ANEURYSM REPAIR Left 11/29/2018   Procedure: REPAIR FALSE ANEURYSM LEFT RADIAL ARTERY;  Surgeon: [name], MD;  Location: [place];  Service: Vascular;  Laterality: Left;   FEMORAL ARTERY STENT Left ~ 2014   "taken out of my leg; couldn' catorgorize what kind so the put it under all 3"; cataroziepitheloid hemanioendotheliomau   FOOT FRACTURE SURGERY Left [place] / REPLACE / REMOVE PACEMAKER  10/13/05   right side, medtronic Adapta   KNEE HARDWARE REMOVAL Right 1950's   "3-4 months after the insertion"   KNEE SURGERY Right 1950's   "broke my lower leg; had to put pin in my knee to keep lower leg in place til it healed"   LEFT HEART CATH AND CORS/GRAFTS ANGIOGRAPHY N/A 04/12/2017   Procedure: LEFT HEART CATH AND CORS/GRAFTS ANGIOGRAPHY;  Surgeon: Lorretta Harp, MD;  Location: [place] [facility];  Service: Cardiovascular;  Laterality: N/A;   LEFT HEART CATH AND CORS/GRAFTS ANGIOGRAPHY N/A 02/06/2018   Procedure: LEFT HEART CATH AND CORS/GRAFTS ANGIOGRAPHY;  Surgeon: Troy Sine, MD;  Location: [place] [facility];  Service: Cardiovascular;  Laterality: N/A;   LEFT HEART CATH AND CORS/GRAFTS ANGIOGRAPHY N/A 11/12/2018   Procedure: LEFT HEART CATH AND CORS/GRAFTS ANGIOGRAPHY;  Surgeon: Wellington Hampshire, MD;  Location: [place] [facility];  Service: Cardiovascular;  Laterality: N/A;   LEFT HEART CATH AND CORS/GRAFTS ANGIOGRAPHY N/A 06/22/2020   Procedure: LEFT HEART CATH  AND CORS/GRAFTS ANGIOGRAPHY;  Surgeon: Jettie Booze, MD;  Location: [place] [facility];  Service: Cardiovascular;  Laterality: N/A;   PERIPHERAL VASCULAR CATHETERIZATION N/A 11/25/2015   Procedure: Lower Extremity Angiography;  Surgeon: Lorretta Harp, MD;  Location: [place] [facility];  Service: Cardiovascular;  Laterality: N/A;   PERIPHERAL VASCULAR CATHETERIZATION Left 11/25/2015   Procedure: Peripheral Vascular Intervention;  Surgeon: Lorretta Harp, MD;  Location: [place] [facility];  Service: Cardiovascular;  Laterality: Left;  external iliac   PERIPHERAL VASCULAR CATHETERIZATION N/A 01/03/2016   Procedure: Lower Extremity Angiography;  Surgeon: Lorretta Harp, MD;  Location: [place] [facility];  Service: Cardiovascular;  Laterality: N/A;   PERIPHERAL VASCULAR CATHETERIZATION Left 01/03/2016   Procedure: Peripheral Vascular Intervention;  Surgeon: Lorretta Harp, MD;  Location: [place] [facility];  Service: Cardiovascular;  Laterality: Left;  SFA   PERIPHERAL VASCULAR CATHETERIZATION Right 02/14/2016   Procedure: Peripheral Vascular Atherectomy;  Surgeon: Lorretta Harp, MD;  Location: [place] [facility];  Service: Cardiovascular;  Laterality: Right;  SFA   POPLITEAL ARTERY STENT  01/03/2016   Contralateral access with a 7 [place] crossover sheath (second order catheter placement)   TONSILLECTOMY AND ADENOIDECTOMY     TUMOR EXCISION Right ~ 2005   cancerous tumor removed from shoulder   TUMOR EXCISION Right ~ 2000   benign tumor removed from under shoulder   TUMOR EXCISION Left 06/02/2010   resection of Lt SFA wth interposition of Gore-Tex graft    SOCIAL HISTORY: Social History   Socioeconomic History   Marital status: Married    Spouse name: Not on file   Number of children: Not on file   Years of education: Not on file   Highest education level: Not on file  Occupational History   Not on file  Tobacco Use   Smoking status: Former    Types: Pipe    Quit date:  60    Years since quitting: 50.4   Smokeless tobacco: Never   Tobacco comments:    "quit smoking in 1973"  Vaping Use   Vaping Use: Never used  Substance and Sexual Activity   Alcohol use: No   Drug use: No   Sexual activity: Not Currently  Other Topics Concern   Not on file  Social History Narrative   Not on file   Social Determinants of Health   Financial Resource Strain: Not on file  Food Insecurity: Not on file  Transportation Needs: Not on file  Physical Activity: Not on file  Stress: Not on file  Social Connections: Not on file  Intimate Partner Violence: Not on file    FAMILY HISTORY: Family History  Problem Relation Age of Onset   Coronary artery disease Mother    Heart attack Mother    Hypertension Mother    Heart disease Mother        Open  Heart surgery   Diabetes Father    Heart disease Father    Hyperlipidemia Father    Heart attack Father    Hypertension Sister    Diabetes Sister    Heart disease Sister     ALLERGIES:  is allergic to peanut-containing drug products, ace inhibitors, diltiazem hcl, meloxicam, eliquis [apixaban], sertraline hcl, carvedilol, clonidine hcl, and duloxetine hcl.  MEDICATIONS:  Current Outpatient Medications  Medication Sig Dispense Refill   albuterol (VENTOLIN HFA) 108 (90 Base) MCG/ACT inhaler as needed.     amLODipine (NORVASC) 10 MG tablet Take 10 mg by mouth daily.     atorvastatin (LIPITOR) 80 MG tablet Take 1 tablet (80 mg total) by mouth daily at 6 PM. 90 tablet 1   Cholecalciferol (VITAMIN D3) 2000 units capsule Take 2,000 Units by mouth 2 (two) times daily.     clopidogrel (PLAVIX) 75 MG tablet Take 1 tablet (75 mg total) by mouth daily with breakfast. 90 tablet 3   dorzolamide-timolol (COSOPT) 22.3-6.8 MG/ML ophthalmic solution Place 1 drop into both eyes 2 (two) times daily.     glipiZIDE (GLUCOTROL) 5 MG tablet Take 5 mg by mouth daily with breakfast.      hydrALAZINE (APRESOLINE) 25 MG tablet Take 1 tablet  (25 mg total) by mouth as needed (for BP >150 sys or 90 dia).     hydrochlorothiazide (HYDRODIURIL) 25 MG tablet Take 25 mg by mouth every morning.     isosorbide mononitrate (IMDUR) 60 MG 24 hr tablet Take 1 tablet (60 mg total) by mouth daily. 90 tablet 1   ketorolac (ACULAR) 0.4 %  SOLN Place 1 drop into both eyes 4 (four) times daily.     melatonin 5 MG TABS Take 5 mg by mouth at bedtime.     metFORMIN (GLUCOPHAGE) 1000 MG tablet Take 1 tablet (1,000 mg total) by mouth 2 (two) times daily with a meal. Restart on 3/18.     metoprolol tartrate (LOPRESSOR) 100 MG tablet Take 1 tablet (100 mg total) by mouth 2 (two) times daily. 180 tablet 3   MULTIPLE VITAMIN-FOLIC ACID PO Take 1 tablet by mouth daily.     nitroGLYCERIN (NITROSTAT) 0.4 MG SL tablet Place 1 tablet (0.4 mg total) under the tongue every 5 (five) minutes as needed for chest pain. 25 tablet 3   pantoprazole (PROTONIX) 20 MG tablet Take 20 mg by mouth daily.     potassium chloride SA (K-DUR,KLOR-CON) 20 MEQ tablet Take 1 tablet (20 mEq total) by mouth daily. 15 tablet 0   ranolazine (RANEXA) 1000 MG SR tablet Take 1 tablet (1,000 mg total) by mouth 2 (two) times daily. 180 tablet 3   rivaroxaban (XARELTO) 20 MG TABS tablet Take 1 tablet (20 mg total) by mouth daily with supper. 30 tablet 0   rOPINIRole (REQUIP) 1 MG tablet Take 1 mg by mouth at bedtime.     tamsulosin (FLOMAX) 0.4 MG CAPS capsule Take 0.4 mg by mouth 2 (two) times daily.      No current facility-administered medications for this visit.    REVIEW OF SYSTEMS:   Constitutional: ( - ) fevers, ( - )  chills , ( - ) night sweats Eyes: ( - ) blurriness of vision, ( - ) double vision, ( - ) watery eyes Ears, nose, mouth, throat, and face: ( - ) mucositis, ( - ) sore throat Respiratory: ( - ) cough, ( - ) dyspnea, ( - ) wheezes Cardiovascular: ( - ) palpitation, ( - ) chest discomfort, ( - ) lower extremity swelling Gastrointestinal:  ( - ) nausea, ( - ) heartburn, ( - )  change in bowel habits Skin: ( - ) abnormal skin rashes Lymphatics: ( - ) new lymphadenopathy, ( - ) easy bruising Neurological: ( - ) numbness, ( - ) tingling, ( - ) new weaknesses Behavioral/Psych: ( - ) mood change, ( - ) new changes  All other systems were reviewed with the patient and are negative.  PHYSICAL EXAMINATION: ECOG PERFORMANCE STATUS: 0 - Asymptomatic  Vitals:   09/02/21 1440  BP: (!) 148/65  Pulse: 88  Resp: 18  Temp: 97.6 F (36.4 C)  SpO2: 99%   Filed Weights   09/02/21 1440  Weight: 172 lb 1.6 oz (78.1 kg)    GENERAL: Well-appearing elderly Caucasian Burns, alert, no distress and comfortable SKIN: skin color, texture, turgor are normal, no rashes or significant lesions EYES: conjunctiva are pink and non-injected, sclera clear NECK: supple, non-tender LYMPH:  no palpable lymphadenopathy in the cervical, axillary or inguinal LUNGS: clear to auscultation and percussion with normal breathing effort HEART: regular rate & rhythm and no murmurs and no lower extremity edema ABDOMEN: No evidence of hepatosplenomegaly.  Soft, non-tender, non-distended, normal bowel sounds Musculoskeletal: no cyanosis of digits and no clubbing  PSYCH: alert & oriented x 3, fluent speech NEURO: no focal motor/sensory deficits  LABORATORY DATA:  I have reviewed the data as listed    Latest Ref Rng & Units 08/24/2021    2:42 PM 08/04/2020    3:40 PM 06/28/2020   11:26 AM  CBC  WBC 4.0 -  10.5 K/uL 15.2   12.8   17.3    Hemoglobin 13.0 - 17.0 g/dL 10.9   12.0   12.4    Hematocrit 39.0 - 52.0 % 34.4   37.9   38.1    Platelets 150 - 400 K/uL 162   185   171         Latest Ref Rng & Units 08/24/2021    2:42 PM 06/30/2021   11:20 AM 06/24/2020    2:50 AM  CMP  Glucose 70 - 99 mg/dL 138    131    BUN 8 - 23 mg/dL 22    21    Creatinine 0.61 - 1.24 mg/dL 1.01    1.08    Sodium 135 - 145 mmol/L 144    138    Potassium 3.5 - 5.1 mmol/L 3.5    4.0    Chloride 98 - 111 mmol/L 108     108    CO2 22 - 32 mmol/L 29    23    Calcium 8.9 - 10.3 mg/dL 9.4    8.8    Total Protein 6.5 - 8.1 g/dL 7.1   6.3     Total Bilirubin 0.3 - 1.2 mg/dL 0.7   0.3     Alkaline Phos 38 - 126 U/L 57   65     AST 15 - 41 U/L 19   19     ALT 0 - 44 U/L 17   15       RADIOGRAPHIC STUDIES: CT HEAD WO CONTRAST (5MM)  Result Date: 08/09/2021 CLINICAL DATA:  Frequent falls, headaches EXAM: CT HEAD WITHOUT CONTRAST TECHNIQUE: Contiguous axial images were obtained from the base of the skull through the vertex without intravenous contrast. RADIATION DOSE REDUCTION: This exam was performed according to the departmental dose-optimization program which includes automated exposure control, adjustment of the mA and/or kV according to patient size and/or use of iterative reconstruction technique. COMPARISON:  CT head 03/27/2019 FINDINGS: Brain: No acute intracranial hemorrhage, mass effect, or herniation. No extra-axial fluid collections. No evidence of acute territorial infarct. No hydrocephalus. Moderate cortical volume loss. Patchy hypodensities in the periventricular and subcortical white matter, likely secondary to chronic microvascular ischemic changes. Vascular: Extensive calcified plaques in the carotid siphons. Skull: Normal. Negative for fracture or focal lesion. Sinuses/Orbits: No acute finding. Other: None. IMPRESSION: Chronic changes as described with no acute intracranial process identified. Electronically Signed   By: Ofilia Neas M.D.   On: 08/09/2021 15:39   CUP PACEART REMOTE DEVICE CHECK  Result Date: 08/17/2021 Scheduled remote reviewed. Normal device function.  Next remote 91 days- Robert Burns 81 y.o. Burns with medical history significant for newly diagnosed CLL who presents for a follow up visit.  Today we discussed the diagnosis of CLL and treatment options moving forward.  We discussed that this is a chronic condition with no definitive cure.  Additionally  we discussed that treatment is reserved until certain criteria are met.  Typically treatment is started with rapid increase in lymphocytes, massively enlarged lymph nodes, anemia, or thrombocytopenia.  The patient currently does not meet any criteria necessary to start treatment.  As such I would recommend continued close observation of his blood counts and symptoms.  The patient knows to call the clinic with any issues involving fevers, chills, sweats, sudden weight loss, or rapidly enlarging lymph node.  # CLL Rai Stage 0 -- Findings at this time are consistent  with a CLL Rai stage 0.  --Prognostic panel was ordered today --Labs today show white blood cell count 15.2, hemoglobin 10.9, platelets of 162 --No indication for imaging at this time --No criteria met for beginning treatment.  Would recommend continued monitoring --Return to clinic in 3 months time for further evaluation.  No orders of the defined types were placed in this encounter.   All questions were answered. The patient knows to call the clinic with any problems, questions or concerns.  A total of more than 30 minutes were spent on this encounter with face-to-face time and non-face-to-face time, including preparing to see the patient, ordering tests and/or medications, counseling the patient and coordination of care as outlined above.   Ledell Peoples, MD Department of Hematology/Oncology [place] [facility] Phone: [phone] Pager: [phone] Email: [email or web address]  09/06/2021 10:50 AM  Kristian Covey BD, Catovsky D, Caligaris-Cappio F, Dighiero G, Dhner H, [place], [place], [place] E, Chiorazzi N, [place], Rai KR, [place], Eichhorst B, O'Brien S, Robak T, Seymour JF, [place] TJ. iwCLL guidelines for diagnosis, indications for treatment, response assessment, and supportive management of CLL. Blood. 2018 Jun 21;131(25):2745-2760.  Active disease should be clearly  documented to initiate therapy. At least 1 of the following criteria should be met.  1) Evidence of progressive marrow failure as manifested by the development of, or worsening of, anemia and/or thrombocytopenia. Cutoff levels of Hb <10 g/dL or platelet counts <100  109/L are generally regarded as indication for treatment. However, in some patients, platelet counts <100  109/L may remain stable over a long period; this situation does not automatically require therapeutic intervention. 2) Massive (ie, ?6 cm below the left costal margin) or progressive or symptomatic splenomegaly. 3) Massive nodes (ie, ?10 cm in longest diameter) or progressive or symptomatic lymphadenopathy. 4) Progressive lymphocytosis with an increase of ?50% over a 51-monthperiod, or lymphocyte doubling time (LDT) <6 months. LDT can be obtained by linear regression extrapolation of absolute lymphocyte counts obtained at intervals of 2 weeks over an observation period of 2 to 3 months; patients with initial blood lymphocyte counts <30  109/L may require a longer observation period to determine the LDT. Factors contributing to lymphocytosis other than CLL (eg, infections, steroid administration) should be excluded. 5) Autoimmune complications including anemia or thrombocytopenia poorly responsive to corticosteroids. 6) Symptomatic or functional extranodal involvement (eg, skin, kidney, lung, spine). Disease-related symptoms as defined by any of the following: Unintentional weight loss ?10% within the previous 6 months. Significant fatigue (ie, ECOG performance scale 2 or worse; cannot work or unable to perform usual activities). Fevers ?100.69F or 38.0C for 2 or more weeks without evidence of infection. Night sweats for ?1 month without evidence of infection.

## 2021-09-07 ENCOUNTER — Ambulatory Visit: Payer: Medicare HMO | Admitting: Physician Assistant

## 2021-09-07 ENCOUNTER — Ambulatory Visit: Payer: Medicare HMO | Admitting: General Practice

## 2021-09-07 ENCOUNTER — Encounter: Payer: Self-pay | Admitting: Physician Assistant

## 2021-09-07 VITALS — BP 124/76 | HR 93 | Ht 67.0 in | Wt 176.0 lb

## 2021-09-07 DIAGNOSIS — R0789 Other chest pain: Secondary | ICD-10-CM

## 2021-09-07 DIAGNOSIS — I10 Essential (primary) hypertension: Secondary | ICD-10-CM

## 2021-09-07 DIAGNOSIS — Z951 Presence of aortocoronary bypass graft: Secondary | ICD-10-CM

## 2021-09-07 DIAGNOSIS — I480 Paroxysmal atrial fibrillation: Secondary | ICD-10-CM | POA: Diagnosis not present

## 2021-09-07 DIAGNOSIS — G4733 Obstructive sleep apnea (adult) (pediatric): Secondary | ICD-10-CM | POA: Diagnosis not present

## 2021-09-07 DIAGNOSIS — Z7901 Long term (current) use of anticoagulants: Secondary | ICD-10-CM

## 2021-09-07 DIAGNOSIS — I2510 Atherosclerotic heart disease of native coronary artery without angina pectoris: Secondary | ICD-10-CM

## 2021-09-07 DIAGNOSIS — E7800 Pure hypercholesterolemia, unspecified: Secondary | ICD-10-CM

## 2021-09-07 DIAGNOSIS — I495 Sick sinus syndrome: Secondary | ICD-10-CM

## 2021-09-07 NOTE — Patient Instructions (Signed)
Medication Instructions:  Your physician recommends that you continue on your current medications as directed. Please refer to the Current Medication list given to you today.  *If you need a refill on your cardiac medications before your next appointment, please call your pharmacy*   Lab Work: NONE ordered at this time of appointment   If you have labs (blood work) drawn today and your tests are completely normal, you will receive your results only by: [place] (if you have MyChart) OR A paper copy in the mail If you have any lab test that is abnormal or we need to change your treatment, we will call you to review the results.   Testing/Procedures: NONE ordered at this time of appointment     Follow-Up: At [facility], you and your health needs are our priority.  As part of our continuing mission to provide you with exceptional heart care, we have created designated Provider Care Teams.  These Care Teams include your primary Cardiologist (physician) and Advanced Practice Providers (APPs -  Physician Assistants and Nurse Practitioners) who all work together to provide you with the care you need, when you need it.  We recommend signing up for the patient portal called "MyChart".  Sign up information is provided on this After Visit Summary.  MyChart is used to connect with patients for Virtual Visits (Telemedicine).  Patients are able to view lab/test results, encounter notes, upcoming appointments, etc.  Non-urgent messages can be sent to your provider as well.   To learn more about what you can do with MyChart, go to [email or web address].    Your next appointment:   As previously scheduled   The format for your next appointment:   In Person  Provider:   Quay Burow, MD     Other Instructions   Important Information About Sugar

## 2021-09-13 ENCOUNTER — Ambulatory Visit: Payer: Medicare HMO | Attending: Internal Medicine | Admitting: Physical Therapy

## 2021-09-13 ENCOUNTER — Encounter: Payer: Self-pay | Admitting: Physical Therapy

## 2021-09-13 ENCOUNTER — Other Ambulatory Visit: Payer: Self-pay

## 2021-09-13 DIAGNOSIS — R293 Abnormal posture: Secondary | ICD-10-CM

## 2021-09-13 DIAGNOSIS — R2689 Other abnormalities of gait and mobility: Secondary | ICD-10-CM | POA: Insufficient documentation

## 2021-09-13 DIAGNOSIS — G44221 Chronic tension-type headache, intractable: Secondary | ICD-10-CM

## 2021-09-13 DIAGNOSIS — M542 Cervicalgia: Secondary | ICD-10-CM | POA: Diagnosis not present

## 2021-09-13 DIAGNOSIS — R42 Dizziness and giddiness: Secondary | ICD-10-CM | POA: Insufficient documentation

## 2021-09-13 DIAGNOSIS — R29898 Other symptoms and signs involving the musculoskeletal system: Secondary | ICD-10-CM | POA: Diagnosis not present

## 2021-09-13 NOTE — Therapy (Addendum)
[facility] [place], [place], [address] Phone: [phone]   Fax:  [phone]  Physical Therapy Treatment  Patient Details  Name: Robert Burns MRN: [id number] Date of Birth: 1940-08-03 Referring Provider (PT): Dr Rochele Raring   Encounter Date: 09/13/2021  Rationale for Evaluation and Treatment Rehabilitation    PT End of Session - 09/13/21 1146     Visit Number 2    Number of Visits 12    Date for PT Re-Evaluation 10/13/21    PT Start Time 1146    PT Stop Time 1228    PT Time Calculation (min) 42 min    Activity Tolerance Patient tolerated treatment well    Behavior During Therapy [facility] for tasks assessed/performed             Past Medical History:  Diagnosis Date   Arthritis    "all over" (11/25/2015)   CAD (coronary artery disease)    a. s/p CABG  06/2002; b. 02/01/15 PCI: DES to prox SVG to PDA, staged PCI of SVG to Diag in 02/2015; c. 04/2017 Cath/PCI: LM nl, LAD 100ost, 67m 75d, LCX 60ost, OM2 80, RCA 100ost, RPDA 80, LIMA->LAD ok, VG->D1 patent stent, VG->RPDA patent stent, 50p, VG->OM1->OM2 90p (3.0x24 Synergy DES), 100 between OM1->OM2 (med rx).   Cancer ([facility]    Right Shoulder, Left Leg- BCC, SCC, AND MELANOMA   Epithelioid hemangioendothelioma    s/p resection of left SFA/mass with interposition 6 mm GoreTex graft 06/02/10, s/p repeat resection for positive margins 09/22/10   Hyperlipidemia    Hypertension    Leg pain    OSA on CPAP    PAD (peripheral artery disease) ([place]    a. 10/2002 L SFA PTA/BMS; b. 8/17 LE Angio: LEIA 90 (9x40 self exp stent), LSFA short segment prox occlusion (staged PTA/stenting 01/03/2016), patent mid stent, RSFA 921mstaged PTA/DEB 02/14/2016).   Presence of permanent cardiac pacemaker    Medtronic   SSS (sick sinus syndrome) ([facility]   a. s/p PPM in 2007 with gen change 04/2015 - Medtronic Adapta ADDRL1, ser # [id number].   Type II diabetes mellitus (HCC)    Type II    Urgency of urination     Past Surgical History:  Procedure Laterality Date   CARDIAC CATHETERIZATION N/A 02/01/2015   Procedure: Left Heart Cath and Coronary Angiography;  Surgeon: JoLorretta HarpMD;  [facility];  Service: Cardiovascular;  Laterality: N/A;   CARDIAC CATHETERIZATION N/A 02/01/2015   Procedure: Coronary Stent Intervention;  Surgeon: JoLorretta HarpMD;  [facility];  Service: Cardiovascular;  Laterality: N/A;   CARDIAC CATHETERIZATION  06/2002   "just before bypass OR"   CARDIAC CATHETERIZATION N/A 03/01/2015   Procedure: Coronary Stent Intervention;  Surgeon: JoLorretta HarpMD;  [facility];  Service: Cardiovascular;  Laterality: N/A;   CARDIAC CATHETERIZATION  02/06/2018   COLONOSCOPY     CORONARY ANGIOPLASTY     CORONARY ARTERY BYPASS GRAFT  06/2002   x5, LIMA-LAD;VG- Diag; seq VG- ramus & OM branch; VG-PDA   CORONARY STENT INTERVENTION N/A 04/12/2017   Procedure: CORONARY STENT INTERVENTION;  Surgeon: BeLorretta HarpMD;  [facility];  Service: Cardiovascular;  Laterality: N/A;   CORONARY STENT INTERVENTION N/A 02/08/2018   Procedure: CORONARY STENT INTERVENTION;  Surgeon: VaJettie BoozeMD;  [place];  Service: Cardiovascular;  Laterality: N/A;   CORONARY STENT  INTERVENTION N/A 11/12/2018   Procedure: CORONARY STENT INTERVENTION;  Surgeon: Wellington Hampshire, MD;  Location: [place] [facility];  Service: Cardiovascular;  Laterality: N/A;   CORONARY STENT INTERVENTION N/A 06/22/2020   Procedure: CORONARY STENT INTERVENTION;  Surgeon: Jettie Booze, MD;  Location: [place] [facility];  Service: Cardiovascular;  Laterality: N/A;   EP IMPLANTABLE DEVICE N/A 04/23/2015   Procedure: PPM Generator Changeout;  Surgeon: Sanda Klein, MD;  Location: [place] [facility];  Service: Cardiovascular;  Laterality: N/A;   FALSE ANEURYSM REPAIR Left 11/29/2018   Procedure: REPAIR  FALSE ANEURYSM LEFT RADIAL ARTERY;  Surgeon: [name], MD;  Location: [place];  Service: Vascular;  Laterality: Left;   FEMORAL ARTERY STENT Left ~ 2014   "taken out of my leg; couldn' catorgorize what kind so the put it under all 3"; cataroziepitheloid hemanioendotheliomau   FOOT FRACTURE SURGERY Left [place] / REPLACE / REMOVE PACEMAKER  10/13/05   right side, medtronic Adapta   KNEE HARDWARE REMOVAL Right 1950's   "3-4 months after the insertion"   KNEE SURGERY Right 1950's   "broke my lower leg; had to put pin in my knee to keep lower leg in place til it healed"   LEFT HEART CATH AND CORS/GRAFTS ANGIOGRAPHY N/A 04/12/2017   Procedure: LEFT HEART CATH AND CORS/GRAFTS ANGIOGRAPHY;  Surgeon: Lorretta Harp, MD;  Location: [place] [facility] LAB;  Service: Cardiovascular;  Laterality: N/A;   LEFT HEART CATH AND CORS/GRAFTS ANGIOGRAPHY N/A 02/06/2018   Procedure: LEFT HEART CATH AND CORS/GRAFTS ANGIOGRAPHY;  Surgeon: Troy Sine, MD;  Location: [place] [facility];  Service: Cardiovascular;  Laterality: N/A;   LEFT HEART CATH AND CORS/GRAFTS ANGIOGRAPHY N/A 11/12/2018   Procedure: LEFT HEART CATH AND CORS/GRAFTS ANGIOGRAPHY;  Surgeon: Wellington Hampshire, MD;  Location: [place] [facility];  Service: Cardiovascular;  Laterality: N/A;   LEFT HEART CATH AND CORS/GRAFTS ANGIOGRAPHY N/A 06/22/2020   Procedure: LEFT HEART CATH AND CORS/GRAFTS ANGIOGRAPHY;  Surgeon: Jettie Booze, MD;  Location: [place] [facility];  Service: Cardiovascular;  Laterality: N/A;   PERIPHERAL VASCULAR CATHETERIZATION N/A 11/25/2015   Procedure: Lower Extremity Angiography;  Surgeon: Lorretta Harp, MD;  Location: [place] [facility];  Service: Cardiovascular;  Laterality: N/A;   PERIPHERAL VASCULAR CATHETERIZATION Left 11/25/2015   Procedure: Peripheral Vascular Intervention;  Surgeon: Lorretta Harp, MD;  Location: [place] [facility] LAB;  Service: Cardiovascular;  Laterality: Left;  external  iliac   PERIPHERAL VASCULAR CATHETERIZATION N/A 01/03/2016   Procedure: Lower Extremity Angiography;  Surgeon: Lorretta Harp, MD;  Location: [place] [facility];  Service: Cardiovascular;  Laterality: N/A;   PERIPHERAL VASCULAR CATHETERIZATION Left 01/03/2016   Procedure: Peripheral Vascular Intervention;  Surgeon: Lorretta Harp, MD;  Location: [place] [facility];  Service: Cardiovascular;  Laterality: Left;  SFA   PERIPHERAL VASCULAR CATHETERIZATION Right 02/14/2016   Procedure: Peripheral Vascular Atherectomy;  Surgeon: Lorretta Harp, MD;  Location: [place] [facility];  Service: Cardiovascular;  Laterality: Right;  SFA   POPLITEAL ARTERY STENT  01/03/2016   Contralateral access with a 7 [place] crossover sheath (second order catheter placement)   TONSILLECTOMY AND ADENOIDECTOMY     TUMOR EXCISION Right ~ 2005   cancerous tumor removed from shoulder   TUMOR EXCISION Right ~ 2000   benign tumor removed from under shoulder   TUMOR EXCISION Left 06/02/2010   resection of Lt SFA  wth interposition of Gore-Tex graft    There were no vitals filed for this visit.   Subjective Assessment - 09/13/21 1148     Subjective Patient reports no change in sx.    Pertinent History CAD; 5 CABG surgeries; 15 cardio stents; AODM; HNT; arthritis; broken bones    Patient Stated Goals iprove headaches    Currently in Pain? Yes    Pain Score 2     Pain Location Head    Pain Orientation Right;Left;Posterior    Pain Descriptors / Indicators Nagging;Pressure;Constant                               [facility] Adult PT Treatment/Exercise - 09/13/21 0001       Exercises   Exercises Shoulder      Shoulder Exercises: Supine   Horizontal ABduction 10 reps    Theraband Level (Shoulder Horizontal ABduction) Level 3 (Green)      Shoulder Exercises: Seated   Other Seated Exercises chin tuck 5 sec hold x 10 cues to avoid "turtle" neck      Shoulder Exercises: Standing   Horizontal ABduction  10 reps    Theraband Level (Shoulder Horizontal ABduction) Level 3 (Green)    Extension 20 reps    Theraband Level (Shoulder Extension) Level 3 (Green)    Row 20 reps    Theraband Level (Shoulder Row) Level 3 (Green)    Other Standing Exercises on noodle: ER and straight arm retraction      Shoulder Exercises: Stretch   Other Shoulder Stretches doorway: 3 position x 30 sec ea    Other Shoulder Stretches Upper trap and LS stretch 1x 30 sec ea bil in sitting      Manual Therapy   Manual Therapy Soft tissue mobilization;Myofascial release;Passive ROM    Manual therapy comments to decrease muscle tone and tension    Soft tissue mobilization to bil UT, levator scap and cervical paraspinals    Myofascial Release suboccipital release    Passive ROM passive stretch to bil UT, LS. Cervical rotation L and R with prolonged holds                     PT Education - 09/13/21 1228     Education Details HEP    Person(s) Educated Patient    Methods Explanation;Demonstration;Handout    Comprehension Verbalized understanding;Returned demonstration                 PT Long Term Goals - 09/01/21 1719       PT LONG TERM GOAL #1   Title Improve posture and alignment with patient to demonstrate improve upright posture with posterior shoudler girdle engaged    Baseline -    Time 6    Period Weeks    Status New    Target Date 10/13/21      PT LONG TERM GOAL #2   Title Increase cervical ROM by 5-8 degrees in all planes to improve quality of life    Baseline -    Time 6    Period Weeks    Status New    Target Date 10/13/21      PT LONG TERM GOAL #3   Title Decrease frequancy, intensityand/or duration of headaches by 25-50%    Baseline -    Time 6    Period Weeks    Status New    Target Date 10/13/21  PT LONG TERM GOAL #4   Title Independent in HEP    Baseline -    Time 6    Period Weeks    Status New    Target Date 10/13/21                   Plan -  09/13/21 1233     Clinical Impression Statement Patient reporting ongoing constant headaches. HEP reviewed and progressed with more stretches. Would benefit from cervical rotation stretches also. Tband extension caused some increase in bil neck tension. Good response to MT with decreased tension afterward.    PT Frequency 2x / week    PT Duration 6 weeks    PT Treatment/Interventions ADLs/Self Care Home Management;Cryotherapy;Electrical Stimulation;Iontophoresis '4mg'$ /ml Dexamethasone;Moist Heat;Ultrasound;Therapeutic activities;Therapeutic exercise;Neuromuscular re-education;Patient/family education;Dry needling;Taping    PT Next Visit Plan review and progress exercises working to gently stretch pecs and strengthen posterior shoudler girdle musculature; trial of DN and/or manual work as well as modalities as indicated    PT [place] and Agree with Plan of Care Patient             Patient will benefit from skilled therapeutic intervention in order to improve the following deficits and impairments:  Decreased range of motion, Increased fascial restricitons, Decreased activity tolerance, Pain, Hypomobility, Impaired flexibility, Improper body mechanics, Decreased mobility, Decreased strength, Postural dysfunction  Visit Diagnosis: Chronic tension-type headache, intractable  Cervicalgia  Other symptoms and signs involving the musculoskeletal system  Abnormal posture     Problem List Patient Active Problem List   Diagnosis Date Noted   CLL (chronic lymphocytic leukemia) ([place]) 09/02/2021   Hematoma of right hip 01/10/2021   Primary osteoarthritis of right elbow 01/10/2021   Chronic anticoagulation 08/12/2019   Dyslipidemia (high LDL; low HDL) 03/05/2019   Unstable angina ([place]) 11/11/2018   Hypercholesterolemia 02/20/2018   Paroxysmal atrial fibrillation ([place]) 02/20/2018   CAD (coronary artery disease) of bypass graft 02/06/2018   CAD, multiple vessel  02/06/2018   Leukocytosis 02/16/2016   Peripheral arterial disease ([place])    Pacemaker 04/23/2015   Positive cardiac stress test    OSA on CPAP 11/04/2012   SSS (sick sinus syndrome), medtronic adapta    Hyperlipidemia due to type 2 diabetes mellitus ([place])    Superficial femoral artery injury 05/09/2011   SPRAIN&STRAIN OTHER SPECIFIED SITES KNEE&LEG 01/31/2010   Type 2 diabetes mellitus with complication, without long-term current use of insulin ([place]) 11/26/2006   Essential hypertension 11/26/2006    Madelyn Flavors, PT 09/13/2021, 12:35 PM  [place], [place], [address] Phone: [phone]   Fax:  [phone]  Name: NYEEM STOKE MRN: [id number] Date of Birth: Dec 03, 1940

## 2021-09-13 NOTE — Patient Instructions (Signed)
Access Code: [facility] URL: [email or web address] Date: 09/13/2021 Prepared by: [name]  Exercises - Seated Cervical Retraction  - 3 x daily - 7 x weekly - 1 sets - 10 reps - Standing Scapular Retraction  - 3 x daily - 7 x weekly - 1 sets - 10 reps - 10 hold - Shoulder External Rotation and Scapular Retraction  - 3 x daily - 7 x weekly - 1 sets - 10 reps -   hold - Shoulder External Rotation in 45 Degrees Abduction  - 2 x daily - 7 x weekly - 1-2 sets - 10 reps - 30 sec  hold - Doorway Pec Stretch at 60 Degrees Abduction  - 3 x daily - 7 x weekly - 1 sets - 3 reps - Doorway Pec Stretch at 90 Degrees Abduction  - 3 x daily - 7 x weekly - 1 sets - 3 reps - 30 seconds  hold - Doorway Pec Stretch at 120 Degrees Abduction  - 3 x daily - 7 x weekly - 1 sets - 3 reps - 30 second hold  hold - Seated Cervical Sidebending Stretch  - 2 x daily - 7 x weekly - 1 sets - 3 reps - 30sec hold - Seated Levator Scapulae Stretch  - 2 x daily - 7 x weekly - 1 sets - 3 reps - 30 sec hold  Patient Education - Trigger Point Dry Needling

## 2021-09-15 LAB — ZAP-70 PANEL (ASR)

## 2021-09-19 DIAGNOSIS — R058 Other specified cough: Secondary | ICD-10-CM | POA: Diagnosis not present

## 2021-09-20 DIAGNOSIS — H34811 Central retinal vein occlusion, right eye, with macular edema: Secondary | ICD-10-CM | POA: Diagnosis not present

## 2021-09-20 DIAGNOSIS — H401234 Low-tension glaucoma, bilateral, indeterminate stage: Secondary | ICD-10-CM | POA: Diagnosis not present

## 2021-09-20 DIAGNOSIS — H35372 Puckering of macula, left eye: Secondary | ICD-10-CM | POA: Diagnosis not present

## 2021-09-20 DIAGNOSIS — H35352 Cystoid macular degeneration, left eye: Secondary | ICD-10-CM | POA: Diagnosis not present

## 2021-09-20 DIAGNOSIS — H3589 Other specified retinal disorders: Secondary | ICD-10-CM | POA: Diagnosis not present

## 2021-09-22 ENCOUNTER — Encounter: Payer: Self-pay | Admitting: Physical Therapy

## 2021-09-22 ENCOUNTER — Ambulatory Visit: Payer: Medicare HMO | Admitting: Physical Therapy

## 2021-09-22 DIAGNOSIS — R29898 Other symptoms and signs involving the musculoskeletal system: Secondary | ICD-10-CM | POA: Diagnosis not present

## 2021-09-22 DIAGNOSIS — R293 Abnormal posture: Secondary | ICD-10-CM

## 2021-09-22 DIAGNOSIS — R2689 Other abnormalities of gait and mobility: Secondary | ICD-10-CM | POA: Diagnosis not present

## 2021-09-22 DIAGNOSIS — R42 Dizziness and giddiness: Secondary | ICD-10-CM | POA: Diagnosis not present

## 2021-09-22 DIAGNOSIS — M542 Cervicalgia: Secondary | ICD-10-CM

## 2021-09-22 DIAGNOSIS — G44221 Chronic tension-type headache, intractable: Secondary | ICD-10-CM | POA: Diagnosis not present

## 2021-09-22 NOTE — Therapy (Signed)
[place], [place], [address] Phone: [phone]   Fax:  [phone]  Physical Therapy Treatment  Patient Details  Name: Robert Burns MRN: [id number] Date of Birth: June 21, 1940 Referring Provider (PT): Dr Rochele Raring   Encounter Date: 09/22/2021  Rationale for Evaluation and Treatment Rehabilitation    PT End of Session - 09/22/21 0758     Visit Number 3    Number of Visits 12    Date for PT Re-Evaluation 10/13/21    PT Start Time 0800    PT Stop Time 0845    PT Time Calculation (min) 45 min    Activity Tolerance Patient tolerated treatment well    Behavior During Therapy [facility] for tasks assessed/performed             Past Medical History:  Diagnosis Date   Arthritis    "all over" (11/25/2015)   CAD (coronary artery disease)    a. s/p CABG  06/2002; b. 02/01/15 PCI: DES to prox SVG to PDA, staged PCI of SVG to Diag in 02/2015; c. 04/2017 Cath/PCI: LM nl, LAD 100ost, 3m 75d, LCX 60ost, OM2 80, RCA 100ost, RPDA 80, LIMA->LAD ok, VG->D1 patent stent, VG->RPDA patent stent, 50p, VG->OM1->OM2 90p (3.0x24 Synergy DES), 100 between OM1->OM2 (med rx).   Cancer ([facility]    Right Shoulder, Left Leg- BCC, SCC, AND MELANOMA   Epithelioid hemangioendothelioma    s/p resection of left SFA/mass with interposition 6 mm GoreTex graft 06/02/10, s/p repeat resection for positive margins 09/22/10   Hyperlipidemia    Hypertension    Leg pain    OSA on CPAP    PAD (peripheral artery disease) ([place]    a. 10/2002 L SFA PTA/BMS; b. 8/17 LE Angio: LEIA 90 (9x40 self exp stent), LSFA short segment prox occlusion (staged PTA/stenting 01/03/2016), patent mid stent, RSFA [id number] PTA/DEB 02/14/2016).   Presence of permanent cardiac pacemaker    Medtronic   [place]   a. s/p PPM in 2007 with gen change 04/2015 - Medtronic Adapta ADDRL1, ser # [id number].   Type II diabetes mellitus (HCC)    Type  II   Urgency of urination     Past Surgical History:  Procedure Laterality Date   CARDIAC CATHETERIZATION N/A 02/01/2015   Procedure: Left Heart Cath and Coronary Angiography;  Surgeon: JoLorretta HarpMD;  [place];  Service: Cardiovascular;  Laterality: N/A;   CARDIAC CATHETERIZATION N/A 02/01/2015   Procedure: Coronary Stent Intervention;  Surgeon: JoLorretta HarpMD;  [facility];  Service: Cardiovascular;  Laterality: N/A;   CARDIAC CATHETERIZATION  06/2002   "just before bypass OR"   CARDIAC CATHETERIZATION N/A 03/01/2015   Procedure: Coronary Stent Intervention;  Surgeon: JoLorretta HarpMD;  [facility];  Service: Cardiovascular;  Laterality: N/A;   CARDIAC CATHETERIZATION  02/06/2018   COLONOSCOPY     CORONARY ANGIOPLASTY     CORONARY ARTERY BYPASS GRAFT  06/2002   x5, LIMA-LAD;VG- Diag; seq VG- ramus & OM branch; VG-PDA   CORONARY STENT INTERVENTION N/A 04/12/2017   Procedure: CORONARY STENT INTERVENTION;  Surgeon: BeLorretta HarpMD;  [facility] [place];  Service: Cardiovascular;  Laterality: N/A;   CORONARY STENT INTERVENTION N/A 02/08/2018   Procedure: CORONARY STENT INTERVENTION;  Surgeon: VaJettie BoozeMD;  [facility] [place] [place];  Service: Cardiovascular;  Laterality: N/A;   CORONARY STENT  INTERVENTION N/A 11/12/2018   Procedure: CORONARY STENT INTERVENTION;  Surgeon: [place] Hampshire, MD;  Location: [place] CV LAB;  Service: Cardiovascular;  Laterality: N/A;   CORONARY STENT INTERVENTION N/A 06/22/2020   Procedure: CORONARY STENT INTERVENTION;  Surgeon: Jettie Booze, MD;  Location: [place] CV LAB;  Service: Cardiovascular;  Laterality: N/A;   EP IMPLANTABLE DEVICE N/A 04/23/2015   Procedure: PPM Generator Changeout;  Surgeon: Sanda Klein, MD;  Location: [place] CV LAB;  Service: Cardiovascular;  Laterality: N/A;   FALSE ANEURYSM REPAIR Left 11/29/2018   Procedure: REPAIR  FALSE ANEURYSM LEFT RADIAL ARTERY;  Surgeon: [name], MD;  Location: [place];  Service: Vascular;  Laterality: Left;   FEMORAL ARTERY STENT Left ~ 2014   "taken out of my leg; couldn' catorgorize what kind so the put it under all 3"; cataroziepitheloid hemanioendotheliomau   FOOT FRACTURE SURGERY Left [place] / REPLACE / REMOVE PACEMAKER  10/13/05   right side, medtronic Adapta   KNEE HARDWARE REMOVAL Right 1950's   "3-4 months after the insertion"   KNEE SURGERY Right 1950's   "broke my lower leg; had to put pin in my knee to keep lower leg in place til it healed"   LEFT HEART CATH AND CORS/GRAFTS ANGIOGRAPHY N/A 04/12/2017   Procedure: LEFT HEART CATH AND CORS/GRAFTS ANGIOGRAPHY;  Surgeon: Lorretta Harp, MD;  Location: [place] CV LAB;  Service: Cardiovascular;  Laterality: N/A;   LEFT HEART CATH AND CORS/GRAFTS ANGIOGRAPHY N/A 02/06/2018   Procedure: LEFT HEART CATH AND CORS/GRAFTS ANGIOGRAPHY;  Surgeon: [place] Sine, MD;  Location: [place] CV LAB;  Service: Cardiovascular;  Laterality: N/A;   LEFT HEART CATH AND CORS/GRAFTS ANGIOGRAPHY N/A 11/12/2018   Procedure: LEFT HEART CATH AND CORS/GRAFTS ANGIOGRAPHY;  Surgeon: [place] Hampshire, MD;  Location: [place] CV LAB;  Service: Cardiovascular;  Laterality: N/A;   LEFT HEART CATH AND CORS/GRAFTS ANGIOGRAPHY N/A 06/22/2020   Procedure: LEFT HEART CATH AND CORS/GRAFTS ANGIOGRAPHY;  Surgeon: Jettie Booze, MD;  Location: [place] CV LAB;  Service: Cardiovascular;  Laterality: N/A;   PERIPHERAL VASCULAR CATHETERIZATION N/A 11/25/2015   Procedure: Lower Extremity Angiography;  Surgeon: Lorretta Harp, MD;  Location: [place] CV LAB;  Service: Cardiovascular;  Laterality: N/A;   PERIPHERAL VASCULAR CATHETERIZATION Left 11/25/2015   Procedure: Peripheral Vascular Intervention;  Surgeon: Lorretta Harp, MD;  Location: [place] CV LAB;  Service: Cardiovascular;  Laterality: Left;  external  iliac   PERIPHERAL VASCULAR CATHETERIZATION N/A 01/03/2016   Procedure: Lower Extremity Angiography;  Surgeon: Lorretta Harp, MD;  Location: [place] CV LAB;  Service: Cardiovascular;  Laterality: N/A;   PERIPHERAL VASCULAR CATHETERIZATION Left 01/03/2016   Procedure: Peripheral Vascular Intervention;  Surgeon: Lorretta Harp, MD;  Location: [place] CV LAB;  Service: Cardiovascular;  Laterality: Left;  SFA   PERIPHERAL VASCULAR CATHETERIZATION Right 02/14/2016   Procedure: Peripheral Vascular Atherectomy;  Surgeon: Lorretta Harp, MD;  Location: [place] CV LAB;  Service: Cardiovascular;  Laterality: Right;  SFA   POPLITEAL ARTERY STENT  01/03/2016   Contralateral access with a 7 [place] crossover sheath (second order catheter placement)   TONSILLECTOMY AND ADENOIDECTOMY     TUMOR EXCISION Right ~ 2005   cancerous tumor removed from shoulder   TUMOR EXCISION Right ~ 2000   benign tumor removed from under shoulder   TUMOR EXCISION Left 06/02/2010   resection of Lt SFA  wth interposition of Gore-Tex graft    There were no vitals filed for this visit.   Subjective Assessment - 09/22/21 0759     Subjective Patient reporting no pain today. Mild HA. Has been doing all kinds of exercises with his wife including water aerobics.    Pertinent History CAD; 5 CABG surgeries; 15 cardio stents; AODM; HNT; arthritis; broken bones    Patient Stated Goals improve headaches    Currently in Pain? No/denies                               [facility] Adult PT Treatment/Exercise - 09/22/21 0001       Shoulder Exercises: Seated   Other Seated Exercises neck rotation 10 sec hold x 5      Shoulder Exercises: Standing   Horizontal ABduction 20 reps    Theraband Level (Shoulder Horizontal ABduction) Level 3 (Green)    External Rotation 20 reps;Weights    External Rotation Weight (lbs) 2    Extension 20 reps    Theraband Level (Shoulder Extension) Level 3 (Green)    Row 20 reps     Theraband Level (Shoulder Row) Level 3 (Green)    Other Standing Exercises chin tuck x 10; into ball 5x 5sec      Shoulder Exercises: ROM/Strengthening   UBE (Upper Arm Bike) L2 x 4 min 2 fwd/2bwd      Shoulder Exercises: Stretch   Other Shoulder Stretches doorway mid and upper stretch 2 x 30 sec ea   no significant stretch lower   Other Shoulder Stretches Upper trap and LS stretch 2 x 30 sec ea bil in sitting      Manual Therapy   Manual Therapy Soft tissue mobilization;Joint mobilization;Myofascial release    Manual therapy comments to decrease muscle tone and tension    Joint Mobilization lateral glides to cspine; OA mobs for rotation    Soft tissue mobilization to bil cervical and suboccipitals    Myofascial Release suboccipital release    Passive ROM passive stretch into rotation                          PT Long Term Goals - 09/01/21 1719       PT LONG TERM GOAL #1   Title Improve posture and alignment with patient to demonstrate improve upright posture with posterior shoudler girdle engaged    Baseline -    Time 6    Period Weeks    Status New    Target Date 10/13/21      PT LONG TERM GOAL #2   Title Increase cervical ROM by 5-8 degrees in all planes to improve quality of life    Baseline -    Time 6    Period Weeks    Status New    Target Date 10/13/21      PT LONG TERM GOAL #3   Title Decrease frequancy, intensityand/or duration of headaches by 25-50%    Baseline -    Time 6    Period Weeks    Status New    Target Date 10/13/21      PT LONG TERM GOAL #4   Title Independent in HEP    Baseline -    Time 6    Period Weeks    Status New    Target Date 10/13/21  Plan - 09/22/21 0758     Clinical Impression Statement Pt presents today with no pain and reporting decreased sx overall. He continues to have marked tightness in suboccipital muscles and responds well to deep STM here. He has a nodule in the right  lateral occipital area that feels like a lipoma. Good demonstration of self postural correction during exercises today without VCs.    PT Frequency 2x / week    PT Duration 6 weeks    PT Treatment/Interventions ADLs/Self Care Home Management;Cryotherapy;Electrical Stimulation;Iontophoresis '4mg'$ /ml Dexamethasone;Moist Heat;Ultrasound;Therapeutic activities;Therapeutic exercise;Neuromuscular re-education;Patient/family education;Dry needling;Taping    PT Next Visit Plan review and progress exercises working to gently stretch pecs and strengthen posterior shoudler girdle musculature; trial of DN and/or manual work as well as modalities as indicated    PT [place] and Agree with Plan of Care Patient             Patient will benefit from skilled therapeutic intervention in order to improve the following deficits and impairments:  Decreased range of motion, Increased fascial restricitons, Decreased activity tolerance, Pain, Hypomobility, Impaired flexibility, Improper body mechanics, Decreased mobility, Decreased strength, Postural dysfunction  Visit Diagnosis: Cervicalgia  Other symptoms and signs involving the musculoskeletal system  Abnormal posture     Problem List Patient Active Problem List   Diagnosis Date Noted   CLL (chronic lymphocytic leukemia) ([place]) 09/02/2021   Hematoma of right hip 01/10/2021   Primary osteoarthritis of right elbow 01/10/2021   Chronic anticoagulation 08/12/2019   Dyslipidemia (high LDL; low HDL) 03/05/2019   Unstable angina ([place]) 11/11/2018   Hypercholesterolemia 02/20/2018   Paroxysmal atrial fibrillation ([place]) 02/20/2018   CAD (coronary artery disease) of bypass graft 02/06/2018   CAD, multiple vessel 02/06/2018   Leukocytosis 02/16/2016   Peripheral arterial disease ([place])    Pacemaker 04/23/2015   Positive cardiac stress test    OSA on CPAP 11/04/2012   SSS (sick sinus syndrome), medtronic adapta     Hyperlipidemia due to type 2 diabetes mellitus ([place])    Superficial femoral artery injury 05/09/2011   SPRAIN&STRAIN OTHER SPECIFIED SITES KNEE&LEG 01/31/2010   Type 2 diabetes mellitus with complication, without long-term current use of insulin ([place]) 11/26/2006   Essential hypertension 11/26/2006   Madelyn Flavors, PT 09/22/2021, 12:11 PM  [place], [place], [address] Phone: [phone]   Fax:  [phone]  Name: Robert Burns MRN: [id number] Date of Birth: 1940-06-28

## 2021-09-27 ENCOUNTER — Ambulatory Visit: Payer: Medicare HMO | Admitting: Physical Therapy

## 2021-09-27 DIAGNOSIS — R29898 Other symptoms and signs involving the musculoskeletal system: Secondary | ICD-10-CM

## 2021-09-27 DIAGNOSIS — G44221 Chronic tension-type headache, intractable: Secondary | ICD-10-CM

## 2021-09-27 DIAGNOSIS — R42 Dizziness and giddiness: Secondary | ICD-10-CM

## 2021-09-27 DIAGNOSIS — M542 Cervicalgia: Secondary | ICD-10-CM | POA: Diagnosis not present

## 2021-09-27 DIAGNOSIS — R2689 Other abnormalities of gait and mobility: Secondary | ICD-10-CM

## 2021-09-27 DIAGNOSIS — R293 Abnormal posture: Secondary | ICD-10-CM

## 2021-09-27 NOTE — Therapy (Signed)
[place], [place], [address] Phone: [phone]   Fax:  [phone]  Physical Therapy Treatment  Patient Details  Name: RALPH BROUWER MRN: [id number] Date of Birth: July 25, 1940 Referring Provider (PT): Dr Rochele Raring  Rationale for Evaluation and Treatment Rehabilitation  Encounter Date: 09/27/2021   PT End of Session - 09/27/21 1102     Visit Number 4    Number of Visits 12    Date for PT Re-Evaluation 10/13/21    PT Start Time 1103    PT Stop Time 1145    PT Time Calculation (min) 42 min    Activity Tolerance Patient tolerated treatment well    Behavior During Therapy [facility] for tasks assessed/performed             Past Medical History:  Diagnosis Date   Arthritis    "all over" (11/25/2015)   CAD (coronary artery disease)    a. s/p CABG  06/2002; b. 02/01/15 PCI: DES to prox SVG to PDA, staged PCI of SVG to Diag in 02/2015; c. 04/2017 Cath/PCI: LM nl, LAD 100ost, 45m 75d, LCX 60ost, OM2 80, RCA 100ost, RPDA 80, LIMA->LAD ok, VG->D1 patent stent, VG->RPDA patent stent, 50p, VG->OM1->OM2 90p (3.0x24 Synergy DES), 100 between OM1->OM2 (med rx).   Cancer ([facility]    Right Shoulder, Left Leg- BCC, SCC, AND MELANOMA   Epithelioid hemangioendothelioma    s/p resection of left SFA/mass with interposition 6 mm GoreTex graft 06/02/10, s/p repeat resection for positive margins 09/22/10   Hyperlipidemia    Hypertension    Leg pain    OSA on CPAP    PAD (peripheral artery disease) ([place]    a. 10/2002 L SFA PTA/BMS; b. 8/17 LE Angio: LEIA 90 (9x40 self exp stent), LSFA short segment prox occlusion (staged PTA/stenting 01/03/2016), patent mid stent, RSFA [id number] PTA/DEB 02/14/2016).   Presence of permanent cardiac pacemaker    Medtronic   SS[place] syndrome) ([place]   a. s/p PPM in 2007 with gen change 04/2015 - Medtronic Adapta ADDRL1, ser # [id number].   Type II diabetes mellitus (HCC)    Type II    Urgency of urination     Past Surgical History:  Procedure Laterality Date   CARDIAC CATHETERIZATION N/A 02/01/2015   Procedure: Left Heart Cath and Coronary Angiography;  Surgeon: JoLorretta HarpMD;  [facility];  Service: Cardiovascular;  Laterality: N/A;   CARDIAC CATHETERIZATION N/A 02/01/2015   Procedure: Coronary Stent Intervention;  Surgeon: JoLorretta HarpMD;  [facility]: [facility];  Service: Cardiovascular;  Laterality: N/A;   CARDIAC CATHETERIZATION  06/2002   "just before bypass OR"   CARDIAC CATHETERIZATION N/A 03/01/2015   Procedure: Coronary Stent Intervention;  Surgeon: JoLorretta HarpMD;  [facility] [place];  Service: Cardiovascular;  Laterality: N/A;   CARDIAC CATHETERIZATION  02/06/2018   COLONOSCOPY     CORONARY ANGIOPLASTY     CORONARY ARTERY BYPASS GRAFT  06/2002   x5, LIMA-LAD;VG- Diag; seq VG- ramus & OM branch; VG-PDA   CORONARY STENT INTERVENTION N/A 04/12/2017   Procedure: CORONARY STENT INTERVENTION;  Surgeon: BeLorretta HarpMD;  [facility];  Service: Cardiovascular;  Laterality: N/A;   CORONARY STENT INTERVENTION N/A 02/08/2018   Procedure: CORONARY STENT INTERVENTION;  Surgeon: VaJettie BoozeMD;  [facility];  Service: Cardiovascular;  Laterality: N/A;   CORONARY STENT INTERVENTION N/A  11/12/2018   Procedure: CORONARY STENT INTERVENTION;  Surgeon: [place] Hampshire, MD;  Location: [place] CV LAB;  Service: Cardiovascular;  Laterality: N/A;   CORONARY STENT INTERVENTION N/A 06/22/2020   Procedure: CORONARY STENT INTERVENTION;  Surgeon: Jettie Booze, MD;  Location: [place] CV LAB;  Service: Cardiovascular;  Laterality: N/A;   EP IMPLANTABLE DEVICE N/A 04/23/2015   Procedure: PPM Generator Changeout;  Surgeon: Sanda Klein, MD;  Location: [place] CV LAB;  Service: Cardiovascular;  Laterality: N/A;   FALSE ANEURYSM REPAIR Left 11/29/2018   Procedure: REPAIR FALSE  ANEURYSM LEFT RADIAL ARTERY;  Surgeon: [name], MD;  Location: [place];  Service: Vascular;  Laterality: Left;   FEMORAL ARTERY STENT Left ~ 2014   "taken out of my leg; couldn' catorgorize what kind so the put it under all 3"; cataroziepitheloid hemanioendotheliomau   FOOT FRACTURE SURGERY Left [place] / REPLACE / REMOVE PACEMAKER  10/13/05   right side, medtronic Adapta   KNEE HARDWARE REMOVAL Right 1950's   "3-4 months after the insertion"   KNEE SURGERY Right 1950's   "broke my lower leg; had to put pin in my knee to keep lower leg in place til it healed"   LEFT HEART CATH AND CORS/GRAFTS ANGIOGRAPHY N/A 04/12/2017   Procedure: LEFT HEART CATH AND CORS/GRAFTS ANGIOGRAPHY;  Surgeon: Lorretta Harp, MD;  Location: [place] CV LAB;  Service: Cardiovascular;  Laterality: N/A;   LEFT HEART CATH AND CORS/GRAFTS ANGIOGRAPHY N/A 02/06/2018   Procedure: LEFT HEART CATH AND CORS/GRAFTS ANGIOGRAPHY;  Surgeon: Troy Sine, MD;  Location: [place] CV LAB;  Service: Cardiovascular;  Laterality: N/A;   LEFT HEART CATH AND CORS/GRAFTS ANGIOGRAPHY N/A 11/12/2018   Procedure: LEFT HEART CATH AND CORS/GRAFTS ANGIOGRAPHY;  Surgeon: Wellington Hampshire, MD;  Location: [place] CV LAB;  Service: Cardiovascular;  Laterality: N/A;   LEFT HEART CATH AND CORS/GRAFTS ANGIOGRAPHY N/A 06/22/2020   Procedure: LEFT HEART CATH AND CORS/GRAFTS ANGIOGRAPHY;  Surgeon: Jettie Booze, MD;  Location: [place] CV LAB;  Service: Cardiovascular;  Laterality: N/A;   PERIPHERAL VASCULAR CATHETERIZATION N/A 11/25/2015   Procedure: Lower Extremity Angiography;  Surgeon: Lorretta Harp, MD;  Location: [place] CV LAB;  Service: Cardiovascular;  Laterality: N/A;   PERIPHERAL VASCULAR CATHETERIZATION Left 11/25/2015   Procedure: Peripheral Vascular Intervention;  Surgeon: Lorretta Harp, MD;  Location: [place] CV LAB;  Service: Cardiovascular;  Laterality: Left;  external iliac    PERIPHERAL VASCULAR CATHETERIZATION N/A 01/03/2016   Procedure: Lower Extremity Angiography;  Surgeon: Lorretta Harp, MD;  Location: [place] CV LAB;  Service: Cardiovascular;  Laterality: N/A;   PERIPHERAL VASCULAR CATHETERIZATION Left 01/03/2016   Procedure: Peripheral Vascular Intervention;  Surgeon: Lorretta Harp, MD;  Location: [place] CV LAB;  Service: Cardiovascular;  Laterality: Left;  SFA   PERIPHERAL VASCULAR CATHETERIZATION Right 02/14/2016   Procedure: Peripheral Vascular Atherectomy;  Surgeon: Lorretta Harp, MD;  Location: [place] CV LAB;  Service: Cardiovascular;  Laterality: Right;  SFA   POPLITEAL ARTERY STENT  01/03/2016   Contralateral access with a 7 [place] crossover sheath (second order catheter placement)   TONSILLECTOMY AND ADENOIDECTOMY     TUMOR EXCISION Right ~ 2005   cancerous tumor removed from shoulder   TUMOR EXCISION Right ~ 2000   benign tumor removed from under shoulder   TUMOR EXCISION Left 06/02/2010   resection of Lt SFA wth interposition  of Gore-Tex graft    There were no vitals filed for this visit.   Subjective Assessment - 09/27/21 1104     Subjective Pt reports HA is not too bad today. But it's still there. Pt has been working his exercises.    Pertinent History CAD; 5 CABG surgeries; 15 cardio stents; AODM; HNT; arthritis; broken bones    Patient Stated Goals improve headaches    Currently in Pain? Yes    Pain Score 2     Pain Location Neck    Pain Orientation Posterior    Pain Type Chronic pain                [facility] PT Assessment - 09/27/21 0001       Assessment   Medical Diagnosis Tension headaches    Referring Provider (PT) Dr Rochele Raring    Onset Date/Surgical Date 03/10/21    Hand Dominance Left    Next MD Visit 7/23                           [facility] Adult PT Treatment/Exercise - 09/27/21 0001       Shoulder Exercises: Standing   Horizontal ABduction 20 reps    Theraband Level (Shoulder  Horizontal ABduction) Level 3 (Green)    Row 20 reps    Theraband Level (Shoulder Row) Level 4 (Blue)    Row Limitations reactive iso    Diagonals Strengthening;20 reps;Right;Left    Other Standing Exercises on noodle: neck retraction x10; ER green tband 2x10, "W" green tband 2x10      Shoulder Exercises: Stretch   Other Shoulder Stretches doorway mid and upper stretch 2 x 30 sec ea      Manual Therapy   Manual therapy comments to decrease muscle tone and tension    Joint Mobilization lateral glides to cspine; OA mobs for rotation    Soft tissue mobilization to bil cervical and suboccipitals    Myofascial Release suboccipital release    Passive ROM passive stretch into rotation, lateral flexion, flexion                          PT Long Term Goals - 09/01/21 1719       PT LONG TERM GOAL #1   Title Improve posture and alignment with patient to demonstrate improve upright posture with posterior shoudler girdle engaged    Baseline -    Time 6    Period Weeks    Status New    Target Date 10/13/21      PT LONG TERM GOAL #2   Title Increase cervical ROM by 5-8 degrees in all planes to improve quality of life    Baseline -    Time 6    Period Weeks    Status New    Target Date 10/13/21      PT LONG TERM GOAL #3   Title Decrease frequancy, intensityand/or duration of headaches by 25-50%    Baseline -    Time 6    Period Weeks    Status New    Target Date 10/13/21      PT LONG TERM GOAL #4   Title Independent in HEP    Baseline -    Time 6    Period Weeks    Status New    Target Date 10/13/21  Plan - 09/27/21 1112     Clinical Impression Statement Continued to work on progressing posterior shoulder girdle strength. Difficulty with scapular control. Will continue to work on this next session. Pt states he feels pretty good after PT sessions but finds that once he's home he can feel his headaches coming on again. Discussed using  more neck support for when he sits at home.    PT Frequency 2x / week    PT Duration 6 weeks    PT Treatment/Interventions ADLs/Self Care Home Management;Cryotherapy;Electrical Stimulation;Iontophoresis '4mg'$ /ml Dexamethasone;Moist Heat;Ultrasound;Therapeutic activities;Therapeutic exercise;Neuromuscular re-education;Patient/family education;Dry needling;Taping    PT Next Visit Plan review and progress exercises working to gently stretch pecs and strengthen posterior shoudler girdle musculature; trial of DN and/or manual work as well as modalities as indicated    PT [place] and Agree with Plan of Care Patient             Patient will benefit from skilled therapeutic intervention in order to improve the following deficits and impairments:  Decreased range of motion, Increased fascial restricitons, Decreased activity tolerance, Pain, Hypomobility, Impaired flexibility, Improper body mechanics, Decreased mobility, Decreased strength, Postural dysfunction  Visit Diagnosis: Cervicalgia  Other symptoms and signs involving the musculoskeletal system  Abnormal posture  Chronic tension-type headache, intractable  Dizziness and giddiness  Balance disorder     Problem List Patient Active Problem List   Diagnosis Date Noted   CLL (chronic lymphocytic leukemia) ([place]) 09/02/2021   Hematoma of right hip 01/10/2021   Primary osteoarthritis of right elbow 01/10/2021   Chronic anticoagulation 08/12/2019   Dyslipidemia (high LDL; low HDL) 03/05/2019   Unstable angina ([place]) 11/11/2018   Hypercholesterolemia 02/20/2018   Paroxysmal atrial fibrillation ([place]) 02/20/2018   CAD (coronary artery disease) of bypass graft 02/06/2018   CAD, multiple vessel 02/06/2018   Leukocytosis 02/16/2016   Peripheral arterial disease ([place])    Pacemaker 04/23/2015   Positive cardiac stress test    OSA on CPAP 11/04/2012   SSS (sick sinus syndrome), medtronic adapta     Hyperlipidemia due to type 2 diabetes mellitus ([place])    Superficial femoral artery injury 05/09/2011   SPRAIN&STRAIN OTHER SPECIFIED SITES KNEE&LEG 01/31/2010   Type 2 diabetes mellitus with complication, without long-term current use of insulin ([place]) 11/26/2006   Essential hypertension 11/26/2006    [facility] April Gordy Levan, PT, DPT 09/27/2021, 11:44 AM  [facility] [place] [address] [place], [place], [address] Phone: [phone]   Fax:  [phone]  Name: PAYTEN BEAUMIER MRN: [id number] Date of Birth: 05-07-1940

## 2021-09-29 ENCOUNTER — Ambulatory Visit: Payer: Medicare HMO | Admitting: Physical Therapy

## 2021-09-29 DIAGNOSIS — M542 Cervicalgia: Secondary | ICD-10-CM | POA: Diagnosis not present

## 2021-09-29 DIAGNOSIS — R293 Abnormal posture: Secondary | ICD-10-CM | POA: Diagnosis not present

## 2021-09-29 DIAGNOSIS — R2689 Other abnormalities of gait and mobility: Secondary | ICD-10-CM | POA: Diagnosis not present

## 2021-09-29 DIAGNOSIS — G44221 Chronic tension-type headache, intractable: Secondary | ICD-10-CM | POA: Diagnosis not present

## 2021-09-29 DIAGNOSIS — R29898 Other symptoms and signs involving the musculoskeletal system: Secondary | ICD-10-CM | POA: Diagnosis not present

## 2021-09-29 DIAGNOSIS — R42 Dizziness and giddiness: Secondary | ICD-10-CM | POA: Diagnosis not present

## 2021-09-29 NOTE — Therapy (Addendum)
[place], [place], [address] Phone: [phone]   Fax:  [phone]  Physical Therapy Treatment and Discharge Summary  Patient Details  Name: Robert Burns MRN: [id number] Date of Birth: [birth year] Referring Provider (PT): Dr Rochele Raring   Encounter Date: 09/29/2021  Rationale for Evaluation and Treatment Rehabilitation   PT End of Session - 09/29/21 1319     Visit Number 5    Number of Visits 12    Date for PT Re-Evaluation 10/13/21    PT Start Time 0102    PT Stop Time 1402    PT Time Calculation (min) 45 min    Activity Tolerance Patient tolerated treatment well    Behavior During Therapy [facility] for tasks assessed/performed             Past Medical History:  Diagnosis Date   Arthritis    "all over" (11/25/2015)   CAD (coronary artery disease)    a. s/p CABG  06/2002; b. 02/01/15 PCI: DES to prox SVG to PDA, staged PCI of SVG to Diag in 02/2015; c. 04/2017 Cath/PCI: LM nl, LAD 100ost, 62m 75d, LCX 60ost, OM2 80, RCA 100ost, RPDA 80, LIMA->LAD ok, VG->D1 patent stent, VG->RPDA patent stent, 50p, VG->OM1->OM2 90p (3.0x24 Synergy DES), 100 between OM1->OM2 (med rx).   Cancer ([facility]    Right Shoulder, Left Leg- BCC, SCC, AND MELANOMA   Epithelioid hemangioendothelioma    s/p resection of left SFA/mass with interposition 6 mm GoreTex graft 06/02/10, s/p repeat resection for positive margins 09/22/10   Hyperlipidemia    Hypertension    Leg pain    OSA on CPAP    PAD (peripheral artery disease) ([place]    a. 10/2002 L SFA PTA/BMS; b. 8/17 LE Angio: LEIA 90 (9x40 self exp stent), LSFA short segment prox occlusion (staged PTA/stenting 01/03/2016), patent mid stent, RSFA 954mstaged PTA/DEB 02/14/2016).   Presence of permanent cardiac pacemaker    Medtronic   SSS (sick sinus syndrome) ([place]   a. s/p PPM in 2007 with gen change 04/2015 - Medtronic Adapta ADDRL1, ser # [id number].   Type II diabetes  mellitus (HCC)    Type II   Urgency of urination     Past Surgical History:  Procedure Laterality Date   CARDIAC CATHETERIZATION N/A 02/01/2015   Procedure: Left Heart Cath and Coronary Angiography;  Surgeon: JoLorretta HarpMD;  [facility];  Service: Cardiovascular;  Laterality: N/A;   CARDIAC CATHETERIZATION N/A 02/01/2015   Procedure: Coronary Stent Intervention;  Surgeon: JoLorretta HarpMD;  [place];  Service: Cardiovascular;  Laterality: N/A;   CARDIAC CATHETERIZATION  06/2002   "just before bypass OR"   CARDIAC CATHETERIZATION N/A 03/01/2015   Procedure: Coronary Stent Intervention;  Surgeon: JoLorretta HarpMD;  [facility] [facility];  Service: Cardiovascular;  Laterality: N/A;   CARDIAC CATHETERIZATION  02/06/2018   COLONOSCOPY     CORONARY ANGIOPLASTY     CORONARY ARTERY BYPASS GRAFT  06/2002   x5, LIMA-LAD;VG- Diag; seq VG- ramus & OM branch; VG-PDA   CORONARY STENT INTERVENTION N/A 04/12/2017   Procedure: CORONARY STENT INTERVENTION;  Surgeon: BeLorretta HarpMD;  [facility] [place];  Service: Cardiovascular;  Laterality: N/A;   CORONARY STENT INTERVENTION N/A 02/08/2018   Procedure: CORONARY STENT INTERVENTION;  Surgeon: VaJettie BoozeMD;  [facility];  Service: Cardiovascular;  Laterality: N/A;  CORONARY STENT INTERVENTION N/A 11/12/2018   Procedure: CORONARY STENT INTERVENTION;  Surgeon: Wellington Hampshire, MD;  Location: [place] CV LAB;  Service: Cardiovascular;  Laterality: N/A;   CORONARY STENT INTERVENTION N/A 06/22/2020   Procedure: CORONARY STENT INTERVENTION;  Surgeon: Jettie Booze, MD;  Location: [place] CV LAB;  Service: Cardiovascular;  Laterality: N/A;   EP IMPLANTABLE DEVICE N/A 04/23/2015   Procedure: PPM Generator Changeout;  Surgeon: Sanda Klein, MD;  Location: [place] CV LAB;  Service: Cardiovascular;  Laterality: N/A;   FALSE ANEURYSM REPAIR Left  11/29/2018   Procedure: REPAIR FALSE ANEURYSM LEFT RADIAL ARTERY;  Surgeon: [name], MD;  Location: [place];  Service: Vascular;  Laterality: Left;   FEMORAL ARTERY STENT Left ~ 2014   "taken out of my leg; couldn' catorgorize what kind so the put it under all 3"; cataroziepitheloid hemanioendotheliomau   FOOT FRACTURE SURGERY Left [place] / REPLACE / REMOVE PACEMAKER  10/13/05   right side, medtronic Adapta   KNEE HARDWARE REMOVAL Right 1950's   "3-4 months after the insertion"   KNEE SURGERY Right 1950's   "broke my lower leg; had to put pin in my knee to keep lower leg in place til it healed"   LEFT HEART CATH AND CORS/GRAFTS ANGIOGRAPHY N/A 04/12/2017   Procedure: LEFT HEART CATH AND CORS/GRAFTS ANGIOGRAPHY;  Surgeon: Lorretta Harp, MD;  Location: [place] CV LAB;  Service: Cardiovascular;  Laterality: N/A;   LEFT HEART CATH AND CORS/GRAFTS ANGIOGRAPHY N/A 02/06/2018   Procedure: LEFT HEART CATH AND CORS/GRAFTS ANGIOGRAPHY;  Surgeon: [place] Sine, MD;  Location: [place] CV LAB;  Service: Cardiovascular;  Laterality: N/A;   LEFT HEART CATH AND CORS/GRAFTS ANGIOGRAPHY N/A 11/12/2018   Procedure: LEFT HEART CATH AND CORS/GRAFTS ANGIOGRAPHY;  Surgeon: Wellington Hampshire, MD;  Location: [place] CV LAB;  Service: Cardiovascular;  Laterality: N/A;   LEFT HEART CATH AND CORS/GRAFTS ANGIOGRAPHY N/A 06/22/2020   Procedure: LEFT HEART CATH AND CORS/GRAFTS ANGIOGRAPHY;  Surgeon: Jettie Booze, MD;  Location: [place] CV LAB;  Service: Cardiovascular;  Laterality: N/A;   PERIPHERAL VASCULAR CATHETERIZATION N/A 11/25/2015   Procedure: Lower Extremity Angiography;  Surgeon: Lorretta Harp, MD;  Location: [place] CV LAB;  Service: Cardiovascular;  Laterality: N/A;   PERIPHERAL VASCULAR CATHETERIZATION Left 11/25/2015   Procedure: Peripheral Vascular Intervention;  Surgeon: Lorretta Harp, MD;  Location: [place] CV LAB;  Service: Cardiovascular;   Laterality: Left;  external iliac   PERIPHERAL VASCULAR CATHETERIZATION N/A 01/03/2016   Procedure: Lower Extremity Angiography;  Surgeon: Lorretta Harp, MD;  Location: [place] CV LAB;  Service: Cardiovascular;  Laterality: N/A;   PERIPHERAL VASCULAR CATHETERIZATION Left 01/03/2016   Procedure: Peripheral Vascular Intervention;  Surgeon: Lorretta Harp, MD;  Location: [place] CV LAB;  Service: Cardiovascular;  Laterality: Left;  SFA   PERIPHERAL VASCULAR CATHETERIZATION Right 02/14/2016   Procedure: Peripheral Vascular Atherectomy;  Surgeon: Lorretta Harp, MD;  Location: [place] [facility] LAB;  Service: Cardiovascular;  Laterality: Right;  SFA   POPLITEAL ARTERY STENT  01/03/2016   Contralateral access with a 7 [place] crossover sheath (second order catheter placement)   TONSILLECTOMY AND ADENOIDECTOMY     TUMOR EXCISION Right ~ 2005   cancerous tumor removed from shoulder   TUMOR EXCISION Right ~ 2000   benign tumor removed from under shoulder   TUMOR EXCISION Left 06/02/2010   resection of  Lt SFA wth interposition of Gore-Tex graft    There were no vitals filed for this visit.   Subjective Assessment - 09/29/21 1320     Subjective Pt reports not much change since last visit, but denies HA. "It's hard to tell if it's improving"    Pertinent History CAD; 5 CABG surgeries; 15 cardio stents; AODM; HNT; arthritis; broken bones    Patient Stated Goals improve headaches    Currently in Pain? No/denies                               [facility] Adult PT Treatment/Exercise - 09/29/21 0001       Shoulder Exercises: Supine   Other Supine Exercises self traction with strap x 30 sec; also done in sitting x 20 sec      Shoulder Exercises: Seated   Other Seated Exercises leaning on mat table through elbows: neck diagonals  x 10 ea; then chin tucks on elbows x 10      Shoulder Exercises: Standing   Extension 20 reps    Theraband Level (Shoulder Extension) Level 3  (Green)    Row 20 reps    Theraband Level (Shoulder Row) Level 4 (Blue)    Row Limitations reactive iso    Diagonals Strengthening;20 reps;Right;Left    Theraband Level (Shoulder Diagonals) Level 3 (Green)      Shoulder Exercises: Stretch   Other Shoulder Stretches doorway mid and upper stretch 2 x 30 sec ea      Manual Therapy   Manual Therapy Soft tissue mobilization;Manual Traction;Myofascial release    Manual therapy comments to decrease muscle tone and tension    Soft tissue mobilization in supine to temporalis muscles bil    Myofascial Release suboccipital release    Manual Traction in supine; also used strap for more equally distributed pull                     PT Education - 09/29/21 1403     Education Details HEP - self traction    Person(s) Educated Patient    Methods Explanation;Demonstration;Handout    Comprehension Verbalized understanding;Returned demonstration                 PT Long Term Goals - 09/01/21 1719       PT LONG TERM GOAL #1   Title Improve posture and alignment with patient to demonstrate improve upright posture with posterior shoudler girdle engaged    Baseline -    Time 6    Period Weeks    Status New    Target Date 10/13/21      PT LONG TERM GOAL #2   Title Increase cervical ROM by 5-8 degrees in all planes to improve quality of life    Baseline -    Time 6    Period Weeks    Status New    Target Date 10/13/21      PT LONG TERM GOAL #3   Title Decrease frequancy, intensityand/or duration of headaches by 25-50%    Baseline -    Time 6    Period Weeks    Status New    Target Date 10/13/21      PT LONG TERM GOAL #4   Title Independent in HEP    Baseline -    Time 6    Period Weeks    Status New    Target Date 10/13/21  Plan - 09/29/21 1516     Clinical Impression Statement Robert Burns presents with no HA today but they persist. He felt relief from crepitis in neck after manual traction  with strap and this was issued to HEP. He was tender in temporalis muscles bil and we discussed cushioning his CPAP to see if that would put less pressure in this area. Progressing well with therex and postural muscle control.    PT Treatment/Interventions ADLs/Self Care Home Management;Cryotherapy;Electrical Stimulation;Iontophoresis 64m/ml Dexamethasone;Moist Heat;Ultrasound;Therapeutic activities;Therapeutic exercise;Neuromuscular re-education;Patient/family education;Dry needling;Taping    PT Next Visit Plan assess self traction response, continue to work on release of temporalis muscles    PT [place]            Patient will benefit from skilled therapeutic intervention in order to improve the following deficits and impairments:  Decreased range of motion, Increased fascial restricitons, Decreased activity tolerance, Pain, Hypomobility, Impaired flexibility, Improper body mechanics, Decreased mobility, Decreased strength, Postural dysfunction  Visit Diagnosis: Cervicalgia  Other symptoms and signs involving the musculoskeletal system  Abnormal posture     Problem List Patient Active Problem List   Diagnosis Date Noted   CLL (chronic lymphocytic leukemia) ([place] 09/02/2021   Hematoma of right hip 01/10/2021   Primary osteoarthritis of right elbow 01/10/2021   Chronic anticoagulation 08/12/2019   Dyslipidemia (high LDL; low HDL) 03/05/2019   Unstable angina ([place] 11/11/2018   Hypercholesterolemia 02/20/2018   Paroxysmal atrial fibrillation ([place] 02/20/2018   CAD (coronary artery disease) of bypass graft 02/06/2018   CAD, multiple vessel 02/06/2018   Leukocytosis 02/16/2016   Peripheral arterial disease ([place]    Pacemaker 04/23/2015   Positive cardiac stress test    OSA on CPAP 11/04/2012   SSS (sick sinus syndrome), medtronic adapta    Hyperlipidemia due to type 2 diabetes mellitus ([place]    Superficial femoral artery injury 05/09/2011   SPRAIN&STRAIN  OTHER SPECIFIED SITES KNEE&LEG 01/31/2010   Type 2 diabetes mellitus with complication, without long-term current use of insulin ([place] 11/26/2006   Essential hypertension 11/26/2006    JMadelyn Flavors PT 09/29/2021, 3:19 PM  [place] [address] [address]Phone: [phone]  Fax:  [phone] Name: Robert BEILKEMRN: [id number] of Birth: 1May 15, 1942 PHYSICAL THERAPY DISCHARGE SUMMARY  Visits from Start of Care: 5  Current functional level related to goals / functional outcomes: Unknown    Remaining deficits: unknown   Education / Equipment: HEP   Patient agrees to discharge. Patient goals were not met. Patient is being discharged due to not returning since the last visit.  JMadelyn Flavors PT 12/13/21 10:07 AM  [facility] [address] [phone](office) [phone](fax)

## 2021-09-29 NOTE — Patient Instructions (Signed)
Access Code: [facility] URL: [email or web address] Date: 09/29/2021 Prepared by: [name]  Exercises - Seated Cervical Retraction  - 3 x daily - 7 x weekly - 1 sets - 10 reps - Standing Scapular Retraction  - 3 x daily - 7 x weekly - 1 sets - 10 reps - 10 hold - Doorway Pec Stretch at 60 Degrees Abduction  - 3 x daily - 7 x weekly - 1 sets - 3 reps - Doorway Pec Stretch at 90 Degrees Abduction  - 3 x daily - 7 x weekly - 1 sets - 3 reps - 30 seconds  hold - Doorway Pec Stretch at 120 Degrees Abduction  - 3 x daily - 7 x weekly - 1 sets - 3 reps - 30 second hold  hold - Seated Cervical Sidebending Stretch  - 2 x daily - 7 x weekly - 1 sets - 3 reps - 30sec hold - Seated Levator Scapulae Stretch  - 2 x daily - 7 x weekly - 1 sets - 3 reps - 30 sec hold - Shoulder External Rotation and Scapular Retraction with Resistance  - 1 x daily - 7 x weekly - 2 sets - 10 reps - Shoulder W - External Rotation with Resistance  - 1 x daily - 7 x weekly - 2 sets - 10 reps - Shoulder PNF D2 Flexion  - 1 x daily - 7 x weekly - 2 sets - 10 reps - Seated Cervical Traction  - 1 sets - 3 reps - 20 sec  hold

## 2021-10-03 ENCOUNTER — Other Ambulatory Visit: Payer: Self-pay | Admitting: Cardiovascular Disease

## 2021-10-04 DIAGNOSIS — R053 Chronic cough: Secondary | ICD-10-CM | POA: Diagnosis not present

## 2021-10-06 ENCOUNTER — Ambulatory Visit: Payer: Medicare HMO | Admitting: Physical Therapy

## 2021-10-18 DIAGNOSIS — H3589 Other specified retinal disorders: Secondary | ICD-10-CM | POA: Diagnosis not present

## 2021-10-18 DIAGNOSIS — H34811 Central retinal vein occlusion, right eye, with macular edema: Secondary | ICD-10-CM | POA: Diagnosis not present

## 2021-10-18 DIAGNOSIS — H35352 Cystoid macular degeneration, left eye: Secondary | ICD-10-CM | POA: Diagnosis not present

## 2021-10-18 DIAGNOSIS — H35372 Puckering of macula, left eye: Secondary | ICD-10-CM | POA: Diagnosis not present

## 2021-11-15 ENCOUNTER — Ambulatory Visit (INDEPENDENT_AMBULATORY_CARE_PROVIDER_SITE_OTHER): Payer: Medicare HMO

## 2021-11-15 DIAGNOSIS — I495 Sick sinus syndrome: Secondary | ICD-10-CM

## 2021-11-15 LAB — CUP PACEART REMOTE DEVICE CHECK
Battery Impedance: 659 Ohm
Battery Remaining Longevity: 82 mo
Battery Voltage: 2.79 V
Brady Statistic AP VP Percent: 2 %
Brady Statistic AP VS Percent: 97 %
Brady Statistic AS VP Percent: 0 %
Brady Statistic AS VS Percent: 1 %
Date Time Interrogation Session: 20230807160416
Implantable Lead Implant Date: 20070706
Implantable Lead Implant Date: 20070706
Implantable Lead Location: 753859
Implantable Lead Location: 753860
Implantable Lead Model: 5076
Implantable Lead Model: 5092
Implantable Pulse Generator Implant Date: 20170113
Lead Channel Impedance Value: 401 Ohm
Lead Channel Impedance Value: 725 Ohm
Lead Channel Pacing Threshold Amplitude: 0.75 V
Lead Channel Pacing Threshold Amplitude: 1.375 V
Lead Channel Pacing Threshold Pulse Width: 0.4 ms
Lead Channel Pacing Threshold Pulse Width: 0.4 ms
Lead Channel Setting Pacing Amplitude: 1.625
Lead Channel Setting Pacing Amplitude: 2.75 V
Lead Channel Setting Pacing Pulse Width: 0.4 ms
Lead Channel Setting Sensing Sensitivity: 5.6 mV

## 2021-11-29 DIAGNOSIS — H35372 Puckering of macula, left eye: Secondary | ICD-10-CM | POA: Diagnosis not present

## 2021-11-29 DIAGNOSIS — H35353 Cystoid macular degeneration, bilateral: Secondary | ICD-10-CM | POA: Diagnosis not present

## 2021-11-29 DIAGNOSIS — H34811 Central retinal vein occlusion, right eye, with macular edema: Secondary | ICD-10-CM | POA: Diagnosis not present

## 2021-12-01 ENCOUNTER — Inpatient Hospital Stay: Payer: Medicare HMO | Attending: Hematology and Oncology

## 2021-12-01 ENCOUNTER — Other Ambulatory Visit: Payer: Self-pay

## 2021-12-01 ENCOUNTER — Other Ambulatory Visit: Payer: Self-pay | Admitting: Hematology and Oncology

## 2021-12-01 ENCOUNTER — Inpatient Hospital Stay: Payer: Medicare HMO | Admitting: Hematology and Oncology

## 2021-12-01 VITALS — BP 141/79 | HR 96 | Temp 97.2°F | Resp 20 | Wt 176.7 lb

## 2021-12-01 DIAGNOSIS — Z7902 Long term (current) use of antithrombotics/antiplatelets: Secondary | ICD-10-CM | POA: Insufficient documentation

## 2021-12-01 DIAGNOSIS — C9110 Chronic lymphocytic leukemia of B-cell type not having achieved remission: Secondary | ICD-10-CM | POA: Diagnosis not present

## 2021-12-01 DIAGNOSIS — Z7901 Long term (current) use of anticoagulants: Secondary | ICD-10-CM | POA: Insufficient documentation

## 2021-12-01 DIAGNOSIS — K219 Gastro-esophageal reflux disease without esophagitis: Secondary | ICD-10-CM | POA: Insufficient documentation

## 2021-12-01 DIAGNOSIS — Z79899 Other long term (current) drug therapy: Secondary | ICD-10-CM | POA: Diagnosis not present

## 2021-12-01 DIAGNOSIS — Z87891 Personal history of nicotine dependence: Secondary | ICD-10-CM | POA: Diagnosis not present

## 2021-12-01 DIAGNOSIS — R059 Cough, unspecified: Secondary | ICD-10-CM | POA: Insufficient documentation

## 2021-12-01 DIAGNOSIS — D72820 Lymphocytosis (symptomatic): Secondary | ICD-10-CM

## 2021-12-01 DIAGNOSIS — Z7984 Long term (current) use of oral hypoglycemic drugs: Secondary | ICD-10-CM | POA: Diagnosis not present

## 2021-12-01 LAB — CBC WITH DIFFERENTIAL (CANCER CENTER ONLY)
Abs Immature Granulocytes: 0.05 10*3/uL (ref 0.00–0.07)
Basophils Absolute: 0.1 10*3/uL (ref 0.0–0.1)
Basophils Relative: 0 %
Eosinophils Absolute: 0.2 10*3/uL (ref 0.0–0.5)
Eosinophils Relative: 1 %
HCT: 33.4 % — ABNORMAL LOW (ref 39.0–52.0)
Hemoglobin: 10.8 g/dL — ABNORMAL LOW (ref 13.0–17.0)
Immature Granulocytes: 0 %
Lymphocytes Relative: 66 %
Lymphs Abs: 10.8 10*3/uL — ABNORMAL HIGH (ref 0.7–4.0)
MCH: 26.4 pg (ref 26.0–34.0)
MCHC: 32.3 g/dL (ref 30.0–36.0)
MCV: 81.7 fL (ref 80.0–100.0)
Monocytes Absolute: 1.3 10*3/uL — ABNORMAL HIGH (ref 0.1–1.0)
Monocytes Relative: 8 %
Neutro Abs: 4.2 10*3/uL (ref 1.7–7.7)
Neutrophils Relative %: 25 %
Platelet Count: 162 10*3/uL (ref 150–400)
RBC: 4.09 MIL/uL — ABNORMAL LOW (ref 4.22–5.81)
RDW: 17.2 % — ABNORMAL HIGH (ref 11.5–15.5)
WBC Count: 16.5 10*3/uL — ABNORMAL HIGH (ref 4.0–10.5)
nRBC: 0 % (ref 0.0–0.2)

## 2021-12-01 LAB — CMP (CANCER CENTER ONLY)
ALT: 10 U/L (ref 0–44)
AST: 13 U/L — ABNORMAL LOW (ref 15–41)
Albumin: 4.3 g/dL (ref 3.5–5.0)
Alkaline Phosphatase: 58 U/L (ref 38–126)
Anion gap: 6 (ref 5–15)
BUN: 20 mg/dL (ref 8–23)
CO2: 28 mmol/L (ref 22–32)
Calcium: 9.6 mg/dL (ref 8.9–10.3)
Chloride: 107 mmol/L (ref 98–111)
Creatinine: 0.92 mg/dL (ref 0.61–1.24)
GFR, Estimated: >60 mL/min (ref >60)
Glucose, Bld: 157 mg/dL — ABNORMAL HIGH (ref 70–99)
Potassium: 3.8 mmol/L (ref 3.5–5.1)
Sodium: 141 mmol/L (ref 135–145)
Total Bilirubin: 0.7 mg/dL (ref 0.3–1.2)
Total Protein: 6.4 g/dL — ABNORMAL LOW (ref 6.5–8.1)

## 2021-12-02 ENCOUNTER — Telehealth: Payer: Self-pay | Admitting: Hematology and Oncology

## 2021-12-02 NOTE — Telephone Encounter (Signed)
Per 8/24 los called and left message for pt about appointment

## 2021-12-10 NOTE — Progress Notes (Signed)
[place] [phone]   [phone]  PROGRESS NOTE  Patient Care Team: [name], MD as PCP - General (Internal Medicine) Lorretta Harp, MD as PCP - Cardiology (Cardiology) Sanda Klein, MD as PCP - Electrophysiology (Cardiology) Lorretta Harp, MD as Consulting Physician (Cardiology) [place], Tami Lin, [place] as Physician Assistant (Cardiology)  Hematological/Oncological History # CLL Rai Stage 0. Del 13 1) 08/09/2021: Labs from PCP, Dr. [name]: --WBC 18.6, Hgb 11.7, MCV 80.4, Plt 164, Lymph 12.40.  2)  08/24/2021: Establish care with [facility].  Flow cytometry results most consistent with CLL  Interval History:  Robert Burns 81 y.o. male with medical history significant for newly diagnosed CLL who presents for a follow up visit. The patient's last visit was on 09/02/2021. In the interim since the last visit he has had no major changes in his health.  On exam today Robert Burns reports that he is "not getting any younger".  He notes he is about the same in the interim since her last visit.  He reports that his energy is somewhat low and he feels like a battery that has run low.  He notes that he does have occasional bouts of coughing and has been evaluated by ENT.  He does not do a scope down there and thought he had acid reflux.  He is taking GERD medications but it is "not helping".  He reports he does continue to have some vertigo issues.  He continues to use his CPAP machine.  He does endorse bruising but is had no overt signs of bleeding or dark stools.  He denies any early satiety, abdominal swelling, or enlarged lymph nodes.  He otherwise denies any fevers, chills, sweats, nausea, vomiting or diarrhea.  Full 10 point ROS is listed below.   MEDICAL HISTORY:  Past Medical History:  Diagnosis Date   Arthritis    "all over" (11/25/2015)   CAD (coronary artery disease)    a. s/p CABG  06/2002; b. 02/01/15 PCI: DES to prox SVG to  PDA, staged PCI of SVG to Diag in 02/2015; c. 04/2017 Cath/PCI: LM nl, LAD 100ost, 75m 75d, LCX 60ost, OM2 80, RCA 100ost, RPDA 80, LIMA->LAD ok, VG->D1 patent stent, VG->RPDA patent stent, 50p, VG->OM1->OM2 90p (3.0x24 Synergy DES), 100 between OM1->OM2 (med rx).   Cancer ([facility]    Right Shoulder, Left Leg- BCC, SCC, AND MELANOMA   Epithelioid hemangioendothelioma    s/p resection of left SFA/mass with interposition 6 mm GoreTex graft 06/02/10, s/p repeat resection for positive margins 09/22/10   Hyperlipidemia    Hypertension    Leg pain    OSA on CPAP    PAD (peripheral artery disease) ([facility]    a. 10/2002 L SFA PTA/BMS; b. 8/17 LE Angio: LEIA 90 (9x40 self exp stent), LSFA short segment prox occlusion (staged PTA/stenting 01/03/2016), patent mid stent, RSFA 969mstaged PTA/DEB 02/14/2016).   Presence of permanent cardiac pacemaker    Medtronic   SSS (sick sinus syndrome) ([facility]   a. s/p PPM in 2007 with gen change 04/2015 - Medtronic Adapta ADDRL1, ser # [id number].   Type II diabetes mellitus (HCC)    Type II   Urgency of urination     SURGICAL HISTORY: Past Surgical History:  Procedure Laterality Date   CARDIAC CATHETERIZATION N/A 02/01/2015   Procedure: Left Heart Cath and Coronary Angiography;  Surgeon: JoLorretta HarpMD;  [facility];  Service: Cardiovascular;  Laterality: N/A;  CARDIAC CATHETERIZATION N/A 02/01/2015   Procedure: Coronary Stent Intervention;  Surgeon: Lorretta Harp, MD;  Location: [place] [facility];  Service: Cardiovascular;  Laterality: N/A;   CARDIAC CATHETERIZATION  06/2002   "just before bypass OR"   CARDIAC CATHETERIZATION N/A 03/01/2015   Procedure: Coronary Stent Intervention;  Surgeon: Lorretta Harp, MD;  Location: [place] [facility];  Service: Cardiovascular;  Laterality: N/A;   CARDIAC CATHETERIZATION  02/06/2018   COLONOSCOPY     CORONARY ANGIOPLASTY     CORONARY ARTERY BYPASS GRAFT  06/2002   x5, LIMA-LAD;VG- Diag; seq VG-  ramus & OM branch; VG-PDA   CORONARY STENT INTERVENTION N/A 04/12/2017   Procedure: CORONARY STENT INTERVENTION;  Surgeon: Lorretta Harp, MD;  Location: [place] [facility];  Service: Cardiovascular;  Laterality: N/A;   CORONARY STENT INTERVENTION N/A 02/08/2018   Procedure: CORONARY STENT INTERVENTION;  Surgeon: Jettie Booze, MD;  Location: [place] [facility];  Service: Cardiovascular;  Laterality: N/A;   CORONARY STENT INTERVENTION N/A 11/12/2018   Procedure: CORONARY STENT INTERVENTION;  Surgeon: Wellington Hampshire, MD;  Location: [place] [facility];  Service: Cardiovascular;  Laterality: N/A;   CORONARY STENT INTERVENTION N/A 06/22/2020   Procedure: CORONARY STENT INTERVENTION;  Surgeon: Jettie Booze, MD;  Location: [place] [facility];  Service: Cardiovascular;  Laterality: N/A;   EP IMPLANTABLE DEVICE N/A 04/23/2015   Procedure: PPM Generator Changeout;  Surgeon: Sanda Klein, MD;  Location: [place] [facility];  Service: Cardiovascular;  Laterality: N/A;   FALSE ANEURYSM REPAIR Left 11/29/2018   Procedure: REPAIR FALSE ANEURYSM LEFT RADIAL ARTERY;  Surgeon: [name], MD;  Location: [place];  Service: Vascular;  Laterality: Left;   FEMORAL ARTERY STENT Left ~ 2014   "taken out of my leg; couldn' catorgorize what kind so the put it under all 3"; cataroziepitheloid hemanioendotheliomau   FOOT FRACTURE SURGERY Left [place] / REPLACE / REMOVE PACEMAKER  10/13/05   right side, medtronic Adapta   KNEE HARDWARE REMOVAL Right 1950's   "3-4 months after the insertion"   KNEE SURGERY Right 1950's   "broke my lower leg; had to put pin in my knee to keep lower leg in place til it healed"   LEFT HEART CATH AND CORS/GRAFTS ANGIOGRAPHY N/A 04/12/2017   Procedure: LEFT HEART CATH AND CORS/GRAFTS ANGIOGRAPHY;  Surgeon: Lorretta Harp, MD;  Location: [place] [facility];  Service: Cardiovascular;  Laterality: N/A;   LEFT HEART CATH AND CORS/GRAFTS ANGIOGRAPHY N/A  02/06/2018   Procedure: LEFT HEART CATH AND CORS/GRAFTS ANGIOGRAPHY;  Surgeon: Troy Sine, MD;  Location: [place] [facility];  Service: Cardiovascular;  Laterality: N/A;   LEFT HEART CATH AND CORS/GRAFTS ANGIOGRAPHY N/A 11/12/2018   Procedure: LEFT HEART CATH AND CORS/GRAFTS ANGIOGRAPHY;  Surgeon: Wellington Hampshire, MD;  Location: [place] [facility];  Service: Cardiovascular;  Laterality: N/A;   LEFT HEART CATH AND CORS/GRAFTS ANGIOGRAPHY N/A 06/22/2020   Procedure: LEFT HEART CATH AND CORS/GRAFTS ANGIOGRAPHY;  Surgeon: Jettie Booze, MD;  Location: [place] [facility];  Service: Cardiovascular;  Laterality: N/A;   PERIPHERAL VASCULAR CATHETERIZATION N/A 11/25/2015   Procedure: Lower Extremity Angiography;  Surgeon: Lorretta Harp, MD;  Location: [place] [facility];  Service: Cardiovascular;  Laterality: N/A;   PERIPHERAL VASCULAR CATHETERIZATION Left 11/25/2015   Procedure: Peripheral Vascular Intervention;  Surgeon: Lorretta Harp, MD;  Location: [place] [facility];  Service:  Cardiovascular;  Laterality: Left;  external iliac   PERIPHERAL VASCULAR CATHETERIZATION N/A 01/03/2016   Procedure: Lower Extremity Angiography;  Surgeon: Lorretta Harp, MD;  Location: [place] [facility];  Service: Cardiovascular;  Laterality: N/A;   PERIPHERAL VASCULAR CATHETERIZATION Left 01/03/2016   Procedure: Peripheral Vascular Intervention;  Surgeon: Lorretta Harp, MD;  Location: [place] [facility];  Service: Cardiovascular;  Laterality: Left;  SFA   PERIPHERAL VASCULAR CATHETERIZATION Right 02/14/2016   Procedure: Peripheral Vascular Atherectomy;  Surgeon: Lorretta Harp, MD;  Location: [place] [facility];  Service: Cardiovascular;  Laterality: Right;  SFA   POPLITEAL ARTERY STENT  01/03/2016   Contralateral access with a 7 [place] crossover sheath (second order catheter placement)   TONSILLECTOMY AND ADENOIDECTOMY     TUMOR EXCISION Right ~ 2005   cancerous tumor removed from shoulder   TUMOR  EXCISION Right ~ 2000   benign tumor removed from under shoulder   TUMOR EXCISION Left 06/02/2010   resection of Lt SFA wth interposition of Gore-Tex graft    SOCIAL HISTORY: Social History   Socioeconomic History   Marital status: Married    Spouse name: Not on file   Number of children: Not on file   Years of education: Not on file   Highest education level: Not on file  Occupational History   Not on file  Tobacco Use   Smoking status: Former    Types: Pipe    Quit date: 45    Years since quitting: 50.7   Smokeless tobacco: Never   Tobacco comments:    "quit smoking in 1973"  Vaping Use   Vaping Use: Never used  Substance and Sexual Activity   Alcohol use: No   Drug use: No   Sexual activity: Not Currently  Other Topics Concern   Not on file  Social History Narrative   Not on file   Social Determinants of Health   Financial Resource Strain: Not on file  Food Insecurity: Not on file  Transportation Needs: Not on file  Physical Activity: Not on file  Stress: Not on file  Social Connections: Not on file  Intimate Partner Violence: Not on file    FAMILY HISTORY: Family History  Problem Relation Age of Onset   Coronary artery disease Mother    Heart attack Mother    Hypertension Mother    Heart disease Mother        Open  Heart surgery   Diabetes Father    Heart disease Father    Hyperlipidemia Father    Heart attack Father    Hypertension Sister    Diabetes Sister    Heart disease Sister     ALLERGIES:  is allergic to peanut-containing drug products, ace inhibitors, diltiazem hcl, meloxicam, eliquis [apixaban], sertraline hcl, carvedilol, clonidine hcl, and duloxetine hcl.  MEDICATIONS:  Current Outpatient Medications  Medication Sig Dispense Refill   albuterol (VENTOLIN HFA) 108 (90 Base) MCG/ACT inhaler as needed.     amLODipine (NORVASC) 10 MG tablet Take 10 mg by mouth daily.     atorvastatin (LIPITOR) 80 MG tablet Take 1 tablet (80 mg total)  by mouth daily at 6 PM. 90 tablet 1   Cholecalciferol (VITAMIN D3) 2000 units capsule Take 2,000 Units by mouth 2 (two) times daily. (Patient not taking: Reported on 09/07/2021)     clopidogrel (PLAVIX) 75 MG tablet Take 1 tablet (75 mg total) by mouth daily with breakfast. 90 tablet 3   dorzolamide-timolol (COSOPT) 22.3-6.8  MG/ML ophthalmic solution Place 1 drop into both eyes 2 (two) times daily.     glipiZIDE (GLUCOTROL) 5 MG tablet Take 5 mg by mouth daily with breakfast.      hydrALAZINE (APRESOLINE) 25 MG tablet Take 1 tablet (25 mg total) by mouth as needed (for BP >150 sys or 90 dia).     hydrochlorothiazide (HYDRODIURIL) 25 MG tablet Take 25 mg by mouth every morning.     isosorbide mononitrate (IMDUR) 60 MG 24 hr tablet TAKE 1 TABLET EVERY DAY 90 tablet 1   ketorolac (ACULAR) 0.4 % SOLN Place 1 drop into both eyes 4 (four) times daily.     melatonin 5 MG TABS Take 5 mg by mouth at bedtime.     metFORMIN (GLUCOPHAGE) 1000 MG tablet Take 1 tablet (1,000 mg total) by mouth 2 (two) times daily with a meal. Restart on 3/18.     metoprolol tartrate (LOPRESSOR) 100 MG tablet Take 1 tablet (100 mg total) by mouth 2 (two) times daily. 180 tablet 3   MULTIPLE VITAMIN-FOLIC ACID PO Take 1 tablet by mouth daily.     nitroGLYCERIN (NITROSTAT) 0.4 MG SL tablet Place 1 tablet (0.4 mg total) under the tongue every 5 (five) minutes as needed for chest pain. 25 tablet 3   pantoprazole (PROTONIX) 20 MG tablet Take 20 mg by mouth daily.     potassium chloride SA (K-DUR,KLOR-CON) 20 MEQ tablet Take 1 tablet (20 mEq total) by mouth daily. 15 tablet 0   ranolazine (RANEXA) 1000 MG SR tablet Take 1 tablet (1,000 mg total) by mouth 2 (two) times daily. 180 tablet 3   rivaroxaban (XARELTO) 20 MG TABS tablet Take 1 tablet (20 mg total) by mouth daily with supper. 30 tablet 0   rOPINIRole (REQUIP) 1 MG tablet Take 1 mg by mouth at bedtime.     tamsulosin (FLOMAX) 0.4 MG CAPS capsule Take 0.4 mg by mouth 2 (two)  times daily.      No current facility-administered medications for this visit.    REVIEW OF SYSTEMS:   Constitutional: ( - ) fevers, ( - )  chills , ( - ) night sweats Eyes: ( - ) blurriness of vision, ( - ) double vision, ( - ) watery eyes Ears, nose, mouth, throat, and face: ( - ) mucositis, ( - ) sore throat Respiratory: ( - ) cough, ( - ) dyspnea, ( - ) wheezes Cardiovascular: ( - ) palpitation, ( - ) chest discomfort, ( - ) lower extremity swelling Gastrointestinal:  ( - ) nausea, ( - ) heartburn, ( - ) change in bowel habits Skin: ( - ) abnormal skin rashes Lymphatics: ( - ) new lymphadenopathy, ( - ) easy bruising Neurological: ( - ) numbness, ( - ) tingling, ( - ) new weaknesses Behavioral/Psych: ( - ) mood change, ( - ) new changes  All other systems were reviewed with the patient and are negative.  PHYSICAL EXAMINATION: ECOG PERFORMANCE STATUS: 0 - Asymptomatic  Vitals:   12/01/21 1534  BP: (!) 141/79  Pulse: 96  Resp: 20  Temp: (!) 97.2 F (36.2 C)  SpO2: 100%   Filed Weights   12/01/21 1534  Weight: 176 lb 11.2 oz (80.2 kg)    GENERAL: Well-appearing elderly Caucasian male, alert, no distress and comfortable SKIN: skin color, texture, turgor are normal, no rashes or significant lesions EYES: conjunctiva are pink and non-injected, sclera clear NECK: supple, non-tender LYMPH:  no palpable lymphadenopathy in the cervical, axillary  or inguinal LUNGS: clear to auscultation and percussion with normal breathing effort HEART: regular rate & rhythm and no murmurs and no lower extremity edema ABDOMEN: No evidence of hepatosplenomegaly.  Soft, non-tender, non-distended, normal bowel sounds Musculoskeletal: no cyanosis of digits and no clubbing  PSYCH: alert & oriented x 3, fluent speech NEURO: no focal motor/sensory deficits  LABORATORY DATA:  I have reviewed the data as listed    Latest Ref Rng & Units 12/01/2021    3:05 PM 08/24/2021    2:42 PM 08/04/2020    3:40  PM  CBC  WBC 4.0 - 10.5 K/uL 16.5  15.2  12.8   Hemoglobin 13.0 - 17.0 g/dL 10.8  10.9  12.0   Hematocrit 39.0 - 52.0 % 33.4  34.4  37.9   Platelets 150 - 400 K/uL 162  162  185        Latest Ref Rng & Units 12/01/2021    3:05 PM 08/24/2021    2:42 PM 06/30/2021   11:20 AM  CMP  Glucose 70 - 99 mg/dL 157  138    BUN 8 - 23 mg/dL 20  22    Creatinine 0.61 - 1.24 mg/dL 0.92  1.01    Sodium 135 - 145 mmol/L 141  144    Potassium 3.5 - 5.1 mmol/L 3.8  3.5    Chloride 98 - 111 mmol/L 107  108    CO2 22 - 32 mmol/L 28  29    Calcium 8.9 - 10.3 mg/dL 9.6  9.4    Total Protein 6.5 - 8.1 g/dL 6.4  7.1  6.3   Total Bilirubin 0.3 - 1.2 mg/dL 0.7  0.7  0.3   Alkaline Phos 38 - 126 U/L 58  57  65   AST 15 - 41 U/L '13  19  19   ' ALT 0 - 44 U/L '10  17  15     ' RADIOGRAPHIC STUDIES: CUP PACEART REMOTE DEVICE CHECK  Result Date: 11/15/2021 Scheduled remote reviewed. Normal device function.  Next remote 91 days- Aurelia Levell Tavano Lesnick 81 y.o. male with medical history significant for newly diagnosed CLL who presents for a follow up visit.  Previously we discussed the diagnosis of CLL and treatment options moving forward.  We discussed that this is a chronic condition with no definitive cure.  Additionally we discussed that treatment is reserved until certain criteria are met.  Typically treatment is started with rapid increase in lymphocytes, massively enlarged lymph nodes, anemia, or thrombocytopenia.  The patient currently does not meet any criteria necessary to start treatment.  As such I would recommend continued close observation of his blood counts and symptoms.  The patient knows to call the clinic with any issues involving fevers, chills, sweats, sudden weight loss, or rapidly enlarging lymph node.  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  --Prognostic panel was ordered today --Labs today show white blood cell count 16.5, hemoglobin  10.8, platelets of 162 --No indication for imaging at this time --No criteria met for beginning treatment.  Would recommend continued monitoring --Return to clinic in 6 months time for further evaluation.  No orders of the defined types were placed in this encounter.   All questions were answered. The patient knows to call the clinic with any problems, questions or concerns.  A total of more than 30 minutes were spent on this encounter with face-to-face  time and non-face-to-face time, including preparing to see the patient, ordering tests and/or medications, counseling the patient and coordination of care as outlined above.   Ledell Peoples, MD Department of Hematology/Oncology [place] at [facility] Phone: [phone] Pager: [phone] Email: [email or web address]  12/10/2021 5:36 PM  Kristian Covey BD, Catovsky D, Caligaris-Cappio F, Dighiero G, Dhner H, [place], [place], [place] E, Chiorazzi N, [place], Rai KR, [place], Eichhorst B, O'Brien S, Robak T, Seymour JF, Kipps TJ. iwCLL guidelines for diagnosis, indications for treatment, response assessment, and supportive management of CLL. Blood. 2018 Jun 21;131(25):2745-2760.  Active disease should be clearly documented to initiate therapy. At least 1 of the following criteria should be met.  1) Evidence of progressive marrow failure as manifested by the development of, or worsening of, anemia and/or thrombocytopenia. Cutoff levels of Hb <10 g/dL or platelet counts <100  109/L are generally regarded as indication for treatment. However, in some patients, platelet counts <100  109/L may remain stable over a long period; this situation does not automatically require therapeutic intervention. 2) Massive (ie, ?6 cm below the left costal margin) or progressive or symptomatic splenomegaly. 3) Massive nodes (ie, ?10 cm in longest diameter) or progressive or symptomatic lymphadenopathy. 4)  Progressive lymphocytosis with an increase of ?50% over a 15-monthperiod, or lymphocyte doubling time (LDT) <6 months. LDT can be obtained by linear regression extrapolation of absolute lymphocyte counts obtained at intervals of 2 weeks over an observation period of 2 to 3 months; patients with initial blood lymphocyte counts <30  109/L may require a longer observation period to determine the LDT. Factors contributing to lymphocytosis other than CLL (eg, infections, steroid administration) should be excluded. 5) Autoimmune complications including anemia or thrombocytopenia poorly responsive to corticosteroids. 6) Symptomatic or functional extranodal involvement (eg, skin, kidney, lung, spine). Disease-related symptoms as defined by any of the following: Unintentional weight loss ?10% within the previous 6 months. Significant fatigue (ie, ECOG performance scale 2 or worse; cannot work or unable to perform usual activities). Fevers ?100.51F or 38.0C for 2 or more weeks without evidence of infection. Night sweats for ?1 month without evidence of infection.

## 2021-12-16 DIAGNOSIS — C9110 Chronic lymphocytic leukemia of B-cell type not having achieved remission: Secondary | ICD-10-CM | POA: Diagnosis not present

## 2021-12-16 DIAGNOSIS — I10 Essential (primary) hypertension: Secondary | ICD-10-CM | POA: Diagnosis not present

## 2021-12-16 DIAGNOSIS — D6869 Other thrombophilia: Secondary | ICD-10-CM | POA: Diagnosis not present

## 2021-12-16 DIAGNOSIS — G44229 Chronic tension-type headache, not intractable: Secondary | ICD-10-CM | POA: Diagnosis not present

## 2021-12-16 DIAGNOSIS — D509 Iron deficiency anemia, unspecified: Secondary | ICD-10-CM | POA: Diagnosis not present

## 2021-12-16 DIAGNOSIS — E1142 Type 2 diabetes mellitus with diabetic polyneuropathy: Secondary | ICD-10-CM | POA: Diagnosis not present

## 2021-12-16 DIAGNOSIS — E7800 Pure hypercholesterolemia, unspecified: Secondary | ICD-10-CM | POA: Diagnosis not present

## 2021-12-16 DIAGNOSIS — Z1389 Encounter for screening for other disorder: Secondary | ICD-10-CM | POA: Diagnosis not present

## 2021-12-16 DIAGNOSIS — G4733 Obstructive sleep apnea (adult) (pediatric): Secondary | ICD-10-CM | POA: Diagnosis not present

## 2021-12-16 DIAGNOSIS — Z0000 Encounter for general adult medical examination without abnormal findings: Secondary | ICD-10-CM | POA: Diagnosis not present

## 2021-12-16 DIAGNOSIS — I25709 Atherosclerosis of coronary artery bypass graft(s), unspecified, with unspecified angina pectoris: Secondary | ICD-10-CM | POA: Diagnosis not present

## 2021-12-16 DIAGNOSIS — Z23 Encounter for immunization: Secondary | ICD-10-CM | POA: Diagnosis not present

## 2021-12-20 ENCOUNTER — Encounter: Payer: Self-pay | Admitting: Cardiovascular Disease

## 2021-12-20 ENCOUNTER — Ambulatory Visit: Payer: Medicare HMO | Attending: Cardiovascular Disease | Admitting: Cardiovascular Disease

## 2021-12-20 DIAGNOSIS — I480 Paroxysmal atrial fibrillation: Secondary | ICD-10-CM

## 2021-12-20 DIAGNOSIS — I739 Peripheral vascular disease, unspecified: Secondary | ICD-10-CM | POA: Diagnosis not present

## 2021-12-20 DIAGNOSIS — I25810 Atherosclerosis of coronary artery bypass graft(s) without angina pectoris: Secondary | ICD-10-CM

## 2021-12-20 DIAGNOSIS — I10 Essential (primary) hypertension: Secondary | ICD-10-CM

## 2021-12-20 DIAGNOSIS — E785 Hyperlipidemia, unspecified: Secondary | ICD-10-CM | POA: Diagnosis not present

## 2021-12-20 DIAGNOSIS — I495 Sick sinus syndrome: Secondary | ICD-10-CM | POA: Diagnosis not present

## 2021-12-20 DIAGNOSIS — E1169 Type 2 diabetes mellitus with other specified complication: Secondary | ICD-10-CM | POA: Diagnosis not present

## 2021-12-20 NOTE — Progress Notes (Signed)
12/20/2021 Robert Burns   05-May-1940  [id number]  Primary Physician [name], MD Primary Cardiologist: Lorretta Harp MD [name], [name]  HPI:  Robert Burns is a 81 y.o.  married Caucasian male, father of 2, grandfather to 3 grandchildren whose wife [name] who is also a patient of mine. I last saw him in the office 06/30/2021.[name]  He is accompanied by his wife today.  He has a history of CAD status post coronary artery bypass grafting March 2004 with a LIMA to his LAD, a vein to a diagonal branch, a sequential vein to a ramus and OM branch, as well as a vein to the PDA. Last functional study performed July 28, 2011, was entirely normal. 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 He had a pacemaker placed for sick sinus syndrome November 2008 followed by Dr. Sallyanne Kuster. He has obstructive sleep apnea on CPAP. He complain of left thigh pain and I angiogram'd him revealing patent arteries though he did have a space-occupying lesion which was removed by Dr. Kellie Simmering with placement of an interposition 6-mm Gore-Tex graft. The pathology was uncertain. 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 Dr. Baxter Flattery follows his lipid profile.Since I saw him back 11/07/12 he has done well.  Followup Dopplers performed in our office 09/27/12 revealed a high-grade lesion in the distal right SFA with an ABI of 0.3. His left ABI 1.1 without obstructive disease.  His most recent lower extremity Doppler Dopplers performed 9/32/16 revealed a right ABI 0.82  And a left ABI of 0.89. Over the last 3 months he's noticed anginal chest pain occurring both at rest and with exertion with left upper extremity radiation. He also complains of left lower extremity discomfort. to moderate anterolateral ischemia. Because of ongoing chest pain and a Myoview that showed anterolateral ischemia and he underwent cardiac catheterization on 02/01/15 revealing high grade segmental proximal right SVG  disease and subtotally occluded diagonal branch SVG. He underwent stenting of his RCA SVG successfully. He does have continued chest pain although somewhat improved since his last procedure.  I brought him back for staged diagonal branch SVG intervention on 03/01/15. This was successful and since that time he denies chest pain or shortness of breath. He underwent a generator change by Dr. Sallyanne Kuster in January for end-of-life of his Medtronic pacemaker. Since I saw him 6 months ago he developed new left calf claudication of the last 3 months. Lower extremity arterial Doppler studies performed today revealed a decline in his left ABI from 0.87 down to 0.59 with an occluded left SFA that is a new finding. 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 He had a short segment occlusion of the proximal left SFA with patent mid left SFA stent. He had 90% segmental calcified mid right SFA stenosis with three-vessel runoff bilaterally. I ended up stenting his left external iliac artery with a 9 mm x 40 mm long nitinol self-expanding  stent which improved his symptoms somewhat although he continued to have lifestyle limiting claudication. He underwent staged intervention of his left SFA on 01/03/16. I stented the entire quadrant totally occluded segment with a 6 mm x 250 mm long Viabahn  Stent. His claudication on the left  has resolved and his ABIs normalized. He now complains of right leg medication. I did demonstrate a 90%  calcified segmental mid right SFA stenosis at the time of angiography 11/25/15. He underwent diamondback orbital rotation atherectomy/drug-eluting angioplasty of his mid calcified right SFA by myself 02/14/16. This follow-up Dopplers performed 02/24/16 revealed normal ABIs bilaterally. His claudication has improved. When I saw him in the office possibly 3 weeks  ago he was complaining of fairly new onset substernal chest pain over the prior several months occurring several times a week. These were new since his RCA and diagonal branch vein graft interventions at the end of 2016. He is on good antianginal medications. A Myoview stress test was ordered that showed subtle inferolateral ischemia and echo showed normal LV function.[name]   He was admitted to the hospital 02/06/2018 for 3 days with unstable angina.  He underwent catheterization by Dr. Claiborne Billings on 02/06/2018 revealing disease in all 3 vein grafts as well as in the LAD beyond LIMA insertion, 2 days later he underwent complex intervention on his diagonal and ramus branch vein grafts with restenting as well as intervention on his LAD via LIMA insertion.  His RCA vein graft was not intervened on.  He currently denies angina or claudication.    He was admitted to the hospital with unstable angina on 11/11/2018.  Enzymes were negative.  He underwent catheterization and intervention by Dr. Fletcher Anon 11/12/2018 via the left radial approach with stenting of his proximal diagonal branch vein graft in his distal PDA vein graft.  Since discharge, his anginal symptoms have significantly improved although he still has a "heaviness feeling" in his chest.  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  This was subsequently surgically addressed by Dr. Donnetta Hutching.   Dr. Sallyanne Kuster did identify significant A. fib burden on remote interrogation of his pacemaker and he was subsequent begun on Eliquis which was transitioned to Xarelto.  He gets occasional chest pain not requiring sublingual nitroglycerin.  His most recent lower extremity arterial Doppler studies revealed patent left iliac and SFAs bilaterally.  Carotid Dopplers performed last December showed moderate right ICA stenosis.   He was admitted to the hospital with unstable angina 06/21/2020 and underwent diagnostic coronary angiography by Dr. Irish Lack via the femoral  approach with intervention of his ramus branch vein graft and PDA vein graft with excellent result.  Since discharge on 06/24/2020 he feels clinically improved but was feeling an episode of chest pain while raking leaves although no discomfort while doing water aerobics for 45 minutes.  He was seen by Dr. Farris Has 11/09/2020 relating some of the symptoms of chest pain.  A Myoview stress test was ordered on 11/12/2020 which was entirely normal.  At this point, he is on maximum antianginal medications without room to add or increase and given the paucity of symptoms I did not feel compelled to pursue a more invasive approach at this time.  Since I saw him 6 months ago he is remained stable.  He gets occasional chest pain which has been attributed to reflux.  He has seen a gastroenterologist for this and is on antireflux medications.  He complains of some hip pain but really denies claudication.  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  Major complaints from the family is of fatigue which is a fairly nonspecific symptom at this point I do not feel requires more aggressive evaluation.   Current Meds  Medication Sig   amLODipine (NORVASC) 10 MG tablet Take 10 mg by mouth daily.   atorvastatin (LIPITOR) 80 MG  tablet Take 1 tablet (80 mg total) by mouth daily at 6 PM.   clopidogrel (PLAVIX) 75 MG tablet Take 1 tablet (75 mg total) by mouth daily with breakfast.   dorzolamide-timolol (COSOPT) 22.3-6.8 MG/ML ophthalmic solution Place 1 drop into both eyes 2 (two) times daily.   glipiZIDE (GLUCOTROL) 5 MG tablet Take 5 mg by mouth daily with breakfast.    hydrALAZINE (APRESOLINE) 25 MG tablet Take 1 tablet (25 mg total) by mouth as needed (for BP >150 sys or 90 dia).   hydrochlorothiazide (HYDRODIURIL) 25 MG tablet Take 25 mg by mouth every morning.   isosorbide mononitrate (IMDUR) 60 MG 24 hr tablet TAKE 1 TABLET EVERY DAY   melatonin 5 MG TABS Take 5 mg by mouth at bedtime.    metFORMIN (GLUCOPHAGE) 1000 MG tablet Take 1 tablet (1,000 mg total) by mouth 2 (two) times daily with a meal. Restart on 3/18.   metoprolol tartrate (LOPRESSOR) 100 MG tablet Take 1 tablet (100 mg total) by mouth 2 (two) times daily.   MULTIPLE VITAMIN-FOLIC ACID PO Take 1 tablet by mouth daily.   nitroGLYCERIN (NITROSTAT) 0.4 MG SL tablet Place 1 tablet (0.4 mg total) under the tongue every 5 (five) minutes as needed for chest pain.   pantoprazole (PROTONIX) 20 MG tablet Take 20 mg by mouth daily.   potassium chloride SA (K-DUR,KLOR-CON) 20 MEQ tablet Take 1 tablet (20 mEq total) by mouth daily.   ranolazine (RANEXA) 1000 MG SR tablet Take 1 tablet (1,000 mg total) by mouth 2 (two) times daily.   rivaroxaban (XARELTO) 20 MG TABS tablet Take 1 tablet (20 mg total) by mouth daily with supper.   rOPINIRole (REQUIP) 1 MG tablet Take 1 mg by mouth at bedtime.   tamsulosin (FLOMAX) 0.4 MG CAPS capsule Take 0.4 mg by mouth 2 (two) times daily.      Allergies  Allergen Reactions   Peanut-Containing Drug Products Anaphylaxis and Other (See Comments)    Tongue swelling is severe    Ace Inhibitors Hives, Rash and Cough        Diltiazem Hcl Other (See Comments)    UNSPECIFIED REACTION     Meloxicam Other (See Comments)    GI upset GI BLEED    Eliquis [Apixaban] Other (See Comments)    dizzyness   Sertraline Hcl Other (See Comments)   Carvedilol Itching   Clonidine Hcl Other (See Comments)    Patch only - skin irritation   Duloxetine Hcl Other (See Comments)    Urinary frequency    Social History   Socioeconomic History   Marital status: Married    Spouse name: Not on file   Number of children: Not on file   Years of education: Not on file   Highest education level: Not on file  Occupational History   Not on file  Tobacco Use   Smoking status: Former    Types: Pipe    Quit date: 45    Years since quitting: 50.7   Smokeless tobacco: Never   Tobacco comments:    "quit  smoking in 1973"  Vaping Use   Vaping Use: Never used  Substance and Sexual Activity   Alcohol use: No   Drug use: No   Sexual activity: Not Currently  Other Topics Concern   Not on file  Social History Narrative   Not on file   Social Determinants of Health   Financial Resource Strain: Not on file  Food Insecurity: Not on file  Transportation  Needs: Not on file  Physical Activity: Not on file  Stress: Not on file  Social Connections: Not on file  Intimate Partner Violence: Not on file     Review of Systems: General: negative for chills, fever, night sweats or weight changes.  Cardiovascular: negative for chest pain, dyspnea on exertion, edema, orthopnea, palpitations, paroxysmal nocturnal dyspnea or shortness of breath Dermatological: negative for rash Respiratory: negative for cough or wheezing Urologic: negative for hematuria Abdominal: negative for nausea, vomiting, diarrhea, bright red blood per rectum, melena, or hematemesis Neurologic: negative for visual changes, syncope, or dizziness All other systems reviewed and are otherwise negative except as noted above.    Blood pressure 126/68, pulse 94, height '5\' 7"'$  (1.702 m), weight 174 lb 12.8 oz (79.3 kg), SpO2 97 %.  General appearance: alert and no distress Neck: no adenopathy, no carotid bruit, no JVD, supple, symmetrical, trachea midline, and thyroid not enlarged, symmetric, no tenderness/mass/nodules Lungs: clear to auscultation bilaterally Heart: regular rate and rhythm, S1, S2 normal, no murmur, click, rub or gallop Extremities: extremities normal, atraumatic, no cyanosis or edema Pulses: Diminished pedal pulses Skin: Skin color, texture, turgor normal. No rashes or lesions Neurologic: Grossly normal  EKG ventricular paced rhythm at 94.  I personally reviewed this EKG.  ASSESSMENT AND PLAN:   Essential hypertension History of essential hypertension blood pressure measured today at 126/68.  He is on  amlodipine, hydralazine, hydrochlorothiazide and metoprolol.  SSS (sick sinus syndrome), medtronic adapta History of sick sinus syndrome status post permanent transvenous pacemaker insertion November 2008 followed by Dr. Sallyanne Kuster.  Hyperlipidemia due to type 2 diabetes mellitus ([place]) History of hyperlipidemia on statin therapy with lipid profile performed 06/30/2021 revealing a total cholesterol of 88, LDL 38 and HDL 35.  OSA on CPAP History of obstructive sleep apnea on CPAP.  Peripheral arterial disease ([place]) History of PAD status post multiple interventions in the past by myself with most recent intervention performed 01/03/2016 involving stenting of his external iliac artery above the previously placed interposition graft with orbital atherectomy and drug-coated balloon angioplasty of a calcified mid right SFA 02/14/2016.  He currently denies claudication but does complain of some thigh pain.  Aortoiliac Dopplers performed 12/29/2020 revealed widely patent iliacs and lower extremity arterial Doppler studies performed the same time revealed a right ABI of 0.92, left of 1.11 with patent SFAs.  Paroxysmal atrial fibrillation ([facility]) History of PAF on Xarelto status post permanent pacing.  Today he is ventricular paced.  CAD (coronary artery disease) of bypass graft History of CAD status post CABG March 2004 with a LIMA to his LAD, vein to diagonal branch, sequential vein to the ramus and OM branch and to the PDA.  He has had many cardiac catheterizations and interventions since that time most recently by Dr. Irish Lack 06/21/2020 with intervention of his ramus branch vein graft and PDA vein graft with excellent results.  He gets occasional chest pain which he attributes to reflux although nothing similar to his symptoms requiring catheterizations in the past.  He is on high-dose long-acting oral nitrates.     Lorretta Harp MD FACP,FACC,FAHA, [facility] 12/20/2021 10:47 AM

## 2021-12-20 NOTE — Assessment & Plan Note (Signed)
History of CAD status post CABG March 2004 with a LIMA to his LAD, vein to diagonal branch, sequential vein to the ramus and OM branch and to the PDA.  He has had many cardiac catheterizations and interventions since that time most recently by Dr. Irish Lack 06/21/2020 with intervention of his ramus branch vein graft and PDA vein graft with excellent results.  He gets occasional chest pain which he attributes to reflux although nothing similar to his symptoms requiring catheterizations in the past.  He is on high-dose long-acting oral nitrates.

## 2021-12-20 NOTE — Assessment & Plan Note (Signed)
History of essential hypertension blood pressure measured today at 126/68.  He is on amlodipine, hydralazine, hydrochlorothiazide and metoprolol.

## 2021-12-20 NOTE — Assessment & Plan Note (Signed)
History of obstructive sleep apnea on CPAP. 

## 2021-12-20 NOTE — Assessment & Plan Note (Signed)
History of sick sinus syndrome status post permanent transvenous pacemaker insertion November 2008 followed by Dr. Sallyanne Kuster.

## 2021-12-20 NOTE — Assessment & Plan Note (Signed)
History of hyperlipidemia on statin therapy with lipid profile performed 06/30/2021 revealing a total cholesterol of 88, LDL 38 and HDL 35.

## 2021-12-20 NOTE — Patient Instructions (Signed)
Medication Instructions:  Your physician recommends that you continue on your current medications as directed. Please refer to the Current Medication list given to you today.  *If you need a refill on your cardiac medications before your next appointment, please call your pharmacy*   Testing/Procedures: Your physician has requested that you have a carotid duplex. This test is an ultrasound of the carotid arteries in your neck. It looks at blood flow through these arteries that supply the brain with blood. Allow one hour for this exam. There are no restrictions or special instructions. To be done in December. This procedure will be done at [facility]    Follow-Up: At [facility], you and your health needs are our priority.  As part of our continuing mission to provide you with exceptional heart care, we have created designated Provider Care Teams.  These Care Teams include your primary Cardiologist (physician) and Advanced Practice Providers (APPs -  Physician Assistants and Nurse Practitioners) who all work together to provide you with the care you need, when you need it.  We recommend signing up for the patient portal called "MyChart".  Sign up information is provided on this After Visit Summary.  MyChart is used to connect with patients for Virtual Visits (Telemedicine).  Patients are able to view lab/test results, encounter notes, upcoming appointments, etc.  Non-urgent messages can be sent to your provider as well.   To learn more about what you can do with MyChart, go to [email or web address].    Your next appointment:   6 month(s)  The format for your next appointment:   In Person  Provider:   Fabian Sharp, PA-C        Then, Quay Burow, MD will plan to see you again in 12 month(s).

## 2021-12-20 NOTE — Assessment & Plan Note (Signed)
History of PAD status post multiple interventions in the past by myself with most recent intervention performed 01/03/2016 involving stenting of his external iliac artery above the previously placed interposition graft with orbital atherectomy and drug-coated balloon angioplasty of a calcified mid right SFA 02/14/2016.  He currently denies claudication but does complain of some thigh pain.  Aortoiliac Dopplers performed 12/29/2020 revealed widely patent iliacs and lower extremity arterial Doppler studies performed the same time revealed a right ABI of 0.92, left of 1.11 with patent SFAs.

## 2021-12-20 NOTE — Assessment & Plan Note (Signed)
History of PAF on Xarelto status post permanent pacing.  Today he is ventricular paced.

## 2021-12-21 NOTE — Progress Notes (Signed)
Remote pacemaker transmission.   

## 2021-12-30 ENCOUNTER — Ambulatory Visit (HOSPITAL_BASED_OUTPATIENT_CLINIC_OR_DEPARTMENT_OTHER)
Admission: RE | Admit: 2021-12-30 | Discharge: 2021-12-30 | Disposition: A | Discharge status: Home / Self Care | Payer: Medicare HMO | Source: Ambulatory Visit | Attending: Cardiology | Admitting: Cardiology

## 2021-12-30 ENCOUNTER — Other Ambulatory Visit (HOSPITAL_COMMUNITY): Payer: Self-pay | Admitting: Cardiovascular Disease

## 2021-12-30 ENCOUNTER — Ambulatory Visit (HOSPITAL_COMMUNITY)
Admission: RE | Admit: 2021-12-30 | Discharge: 2021-12-30 | Disposition: A | Discharge status: Home / Self Care | Payer: Medicare HMO | Source: Ambulatory Visit | Attending: Cardiovascular Disease | Admitting: Cardiovascular Disease

## 2021-12-30 ENCOUNTER — Ambulatory Visit (HOSPITAL_BASED_OUTPATIENT_CLINIC_OR_DEPARTMENT_OTHER)
Admission: RE | Admit: 2021-12-30 | Discharge: 2021-12-30 | Disposition: A | Discharge status: Home / Self Care | Payer: Medicare HMO | Source: Ambulatory Visit | Attending: Cardiovascular Disease | Admitting: Cardiovascular Disease

## 2021-12-30 DIAGNOSIS — E1169 Type 2 diabetes mellitus with other specified complication: Secondary | ICD-10-CM | POA: Diagnosis not present

## 2021-12-30 DIAGNOSIS — I6523 Occlusion and stenosis of bilateral carotid arteries: Secondary | ICD-10-CM

## 2021-12-30 DIAGNOSIS — Z95828 Presence of other vascular implants and grafts: Secondary | ICD-10-CM

## 2021-12-30 DIAGNOSIS — I739 Peripheral vascular disease, unspecified: Secondary | ICD-10-CM | POA: Diagnosis not present

## 2021-12-30 DIAGNOSIS — E785 Hyperlipidemia, unspecified: Secondary | ICD-10-CM | POA: Insufficient documentation

## 2022-01-09 ENCOUNTER — Other Ambulatory Visit: Payer: Self-pay | Admitting: Cardiovascular Disease

## 2022-01-09 ENCOUNTER — Encounter: Payer: Self-pay | Admitting: Cardiovascular Disease

## 2022-01-09 ENCOUNTER — Ambulatory Visit: Payer: Medicare HMO | Attending: Cardiovascular Disease | Admitting: Cardiovascular Disease

## 2022-01-09 VITALS — BP 134/72 | HR 88 | Ht 67.0 in | Wt 171.2 lb

## 2022-01-09 DIAGNOSIS — I25708 Atherosclerosis of coronary artery bypass graft(s), unspecified, with other forms of angina pectoris: Secondary | ICD-10-CM | POA: Diagnosis not present

## 2022-01-09 DIAGNOSIS — I10 Essential (primary) hypertension: Secondary | ICD-10-CM

## 2022-01-09 DIAGNOSIS — I495 Sick sinus syndrome: Secondary | ICD-10-CM | POA: Diagnosis not present

## 2022-01-09 DIAGNOSIS — I739 Peripheral vascular disease, unspecified: Secondary | ICD-10-CM | POA: Diagnosis not present

## 2022-01-09 DIAGNOSIS — D6869 Other thrombophilia: Secondary | ICD-10-CM

## 2022-01-09 DIAGNOSIS — Z950 Presence of cardiac pacemaker: Secondary | ICD-10-CM

## 2022-01-09 DIAGNOSIS — E118 Type 2 diabetes mellitus with unspecified complications: Secondary | ICD-10-CM

## 2022-01-09 DIAGNOSIS — I480 Paroxysmal atrial fibrillation: Secondary | ICD-10-CM | POA: Diagnosis not present

## 2022-01-09 DIAGNOSIS — E7800 Pure hypercholesterolemia, unspecified: Secondary | ICD-10-CM

## 2022-01-09 NOTE — Progress Notes (Signed)
Cardiology Office Note:    Date:  01/10/2022   ID:  Robert Burns, DOB [birth year], MRN [id number]  PCP:  [name], MD  Cardiologist:  Sanda Klein, MD (pacemaker) ; Ancil Linsey, MD  Referring MD: [name], MD   Chief complaint: Pacemaker follow-up  History of Present Illness:    Robert Burns is a 81 y.o. male with a hx of symptomatic sinus bradycardia who received a dual-chamber permanent pacemaker in 2007 with a generator change out in January 2017 (Medtronic Adapta).  Returning for follow-up.  He sees Dr. Quay Burow for coronary atherosclerosis with multiple revascularization procedures (bypass surgery 2004, drug-eluting stent to SVG-PDA and SVG to diagonal in 2016, drug-eluting stents x2 to LAD and in-stent angioplasty for restenosis SVG-diagonal and proximal limb of sequential SVG-OM 02/08/2018) and PAD (angioplasty left SFA 2004 and stent September 2017, right SFA November 2017).  He has normal left ventricular systolic function by echo last performed 02/07/2018, with "posterior basal hypokinesis".  He has biatrial dilation and right ventricular dilation by echo.  His last nuclear study in 2018 showed normal perfusion with EF of 42%, but subsequent echo showed EF normal at 55%.  He is generally doing well and has not had any cardiovascular complaints recently.  He had multiple duplex ultrasound scans done (carotids, abdominal aorta and iliacs, lower extremity arterial system), which have shown stable or slight progression of abnormalities at the level of his left iliac artery and right superficial femoral artery, but without the need for intervention.  He denies angina or dyspnea at rest or with activity, intermittent claudication, lower extremity edema, dizziness, palpitations, syncope, falls, serious bleeding problems or focal neurological events.  He does not have orthopnea or PND.  Pacemaker interrogation shows normal device function with an estimated generator  longevity of 8-9 years.  He has virtually 100% atrial pacing with only 2.4% ventricular pacing.  He has not had any atrial fibrillation since January 17 of this year.  The overall burden of atrial fibrillation is less than 0.1%.  His heart rate histogram distribution is fair.  He has not had any high ventricular rates.   He presented with unstable angina on February 04, 2018 and cardiac catheterization which showed multiple coronary stenoses involving both the native and graft vessels.  He was evaluated for redo bypass surgery but felt to be a poor candidate and subsequently underwent revascularization percutaneously by Dr. Irish Lack.  He received 2 drug-eluting stents to the LAD artery and had angioplasty to restenotic lesions previously placed stents to have his vein grafts (SVG-diagonal, proximal limb of sequential SVG-OM-PLV with distal occluded).  He does have occlusion of the native right coronary artery and had several lesions in the saphenous vein graft to the RCA including an 80% distal stenosis.  He was readmitted in August 2020 with unstable angina and negative cardiac enzymes and had a stent to the distal segment of the SVG to PDA as well as a stent in the SVG to the diagonal artery.  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 Donnetta Hutching.    Past Medical History:  Diagnosis Date   Arthritis    "all over" (11/25/2015)   CAD (coronary artery disease)    a. s/p CABG  06/2002; b. 02/01/15 PCI: DES to prox SVG to PDA, staged PCI of SVG to Diag in 02/2015; c. 04/2017 Cath/PCI: LM nl, LAD 100ost, 24m 75d, LCX 60ost, OM2 80, RCA 100ost, RPDA 80, LIMA->LAD  ok, VG->D1 patent stent, VG->RPDA patent stent, 50p, VG->OM1->OM2 90p (3.0x24 Synergy DES), 100 between OM1->OM2 (med rx).   Cancer [facility])    Right Shoulder, Left Leg- BCC, SCC, AND MELANOMA   Epithelioid hemangioendothelioma    s/p resection of left SFA/mass with interposition 6 mm GoreTex graft  06/02/10, s/p repeat resection for positive margins 09/22/10   Hyperlipidemia    Hypertension    Leg pain    OSA on CPAP    PAD (peripheral artery disease) ([place])    a. 10/2002 L SFA PTA/BMS; b. 8/17 LE Angio: LEIA 90 (9x40 self exp stent), LSFA short segment prox occlusion (staged PTA/stenting 01/03/2016), patent mid stent, RSFA 3m(staged PTA/DEB 02/14/2016).   Presence of permanent cardiac pacemaker    Medtronic   SSS (sick sinus syndrome) ([place]    a. s/p PPM in 2007 with gen change 04/2015 - Medtronic Adapta ADDRL1, ser # [id number].   Type II diabetes mellitus (HCC)    Type II   Urgency of urination     Past Surgical History:  Procedure Laterality Date   CARDIAC CATHETERIZATION N/A 02/01/2015   Procedure: Left Heart Cath and Coronary Angiography;  Surgeon: JLorretta Harp MD;  [place] [facility];  Service: Cardiovascular;  Laterality: N/A;   CARDIAC CATHETERIZATION N/A 02/01/2015   Procedure: Coronary Stent Intervention;  Surgeon: JLorretta Harp MD;  [place] [facility];  Service: Cardiovascular;  Laterality: N/A;   CARDIAC CATHETERIZATION  06/2002   "just before bypass OR"   CARDIAC CATHETERIZATION N/A 03/01/2015   Procedure: Coronary Stent Intervention;  Surgeon: JLorretta Harp MD;  [place];  Service: Cardiovascular;  Laterality: N/A;   CARDIAC CATHETERIZATION  02/06/2018   COLONOSCOPY     CORONARY ANGIOPLASTY     CORONARY ARTERY BYPASS GRAFT  06/2002   x5, LIMA-LAD;VG- Diag; seq VG- ramus & OM branch; VG-PDA   CORONARY STENT INTERVENTION N/A 04/12/2017   Procedure: CORONARY STENT INTERVENTION;  Surgeon: BLorretta Harp MD;  [place] [facility];  Service: Cardiovascular;  Laterality: N/A;   CORONARY STENT INTERVENTION N/A 02/08/2018   Procedure: CORONARY STENT INTERVENTION;  Surgeon: VJettie Booze MD;  [place] [facility];  Service: Cardiovascular;  Laterality: N/A;   CORONARY STENT INTERVENTION N/A 11/12/2018    Procedure: CORONARY STENT INTERVENTION;  Surgeon: AWellington Hampshire MD;  [place] [facility];  Service: Cardiovascular;  Laterality: N/A;   CORONARY STENT INTERVENTION N/A 06/22/2020   Procedure: CORONARY STENT INTERVENTION;  Surgeon: VJettie Booze MD;  [place];  Service: Cardiovascular;  Laterality: N/A;   EP IMPLANTABLE DEVICE N/A 04/23/2015   Procedure: PPM Generator Changeout;  Surgeon: MSanda Klein MD;  [place] [facility];  Service: Cardiovascular;  Laterality: N/A;   FALSE ANEURYSM REPAIR Left 11/29/2018   Procedure: REPAIR FALSE ANEURYSM LEFT RADIAL ARTERY;  Surgeon: ERosetta Posner MD;  [place]  Service: Vascular;  Laterality: Left;   FEMORAL ARTERY STENT Left ~ 2014   "taken out of my leg; couldn' catorgorize what kind so the put it under all 3"; cataroziepitheloid hemanioendotheliomau   FOOT FRACTURE SURGERY Left [place]/ REPLACE / REMOVE PACEMAKER  10/13/05   right side, medtronic Adapta   KNEE HARDWARE REMOVAL Right 1950's   "3-4 months after the insertion"   KNEE SURGERY Right 1950's   "broke my lower leg; had to put  pin in my knee to keep lower leg in place til it healed"   LEFT HEART CATH AND CORS/GRAFTS ANGIOGRAPHY N/A 04/12/2017   Procedure: LEFT HEART CATH AND CORS/GRAFTS ANGIOGRAPHY;  Surgeon: Lorretta Harp, MD;  Location: [place] [facility] LAB;  Service: Cardiovascular;  Laterality: N/A;   LEFT HEART CATH AND CORS/GRAFTS ANGIOGRAPHY N/A 02/06/2018   Procedure: LEFT HEART CATH AND CORS/GRAFTS ANGIOGRAPHY;  Surgeon: Troy Sine, MD;  Location: [place] [facility];  Service: Cardiovascular;  Laterality: N/A;   LEFT HEART CATH AND CORS/GRAFTS ANGIOGRAPHY N/A 11/12/2018   Procedure: LEFT HEART CATH AND CORS/GRAFTS ANGIOGRAPHY;  Surgeon: Wellington Hampshire, MD;  Location: [place] [facility] LAB;  Service: Cardiovascular;  Laterality: N/A;   LEFT HEART CATH AND CORS/GRAFTS ANGIOGRAPHY N/A 06/22/2020    Procedure: LEFT HEART CATH AND CORS/GRAFTS ANGIOGRAPHY;  Surgeon: Jettie Booze, MD;  Location: [place] [facility];  Service: Cardiovascular;  Laterality: N/A;   PERIPHERAL VASCULAR CATHETERIZATION N/A 11/25/2015   Procedure: Lower Extremity Angiography;  Surgeon: Lorretta Harp, MD;  Location: [place] [facility] LAB;  Service: Cardiovascular;  Laterality: N/A;   PERIPHERAL VASCULAR CATHETERIZATION Left 11/25/2015   Procedure: Peripheral Vascular Intervention;  Surgeon: Lorretta Harp, MD;  Location: [place] [facility] LAB;  Service: Cardiovascular;  Laterality: Left;  external iliac   PERIPHERAL VASCULAR CATHETERIZATION N/A 01/03/2016   Procedure: Lower Extremity Angiography;  Surgeon: Lorretta Harp, MD;  Location: [place] [facility] LAB;  Service: Cardiovascular;  Laterality: N/A;   PERIPHERAL VASCULAR CATHETERIZATION Left 01/03/2016   Procedure: Peripheral Vascular Intervention;  Surgeon: Lorretta Harp, MD;  Location: [place] [facility] LAB;  Service: Cardiovascular;  Laterality: Left;  SFA   PERIPHERAL VASCULAR CATHETERIZATION Right 02/14/2016   Procedure: Peripheral Vascular Atherectomy;  Surgeon: Lorretta Harp, MD;  Location: [place] [facility];  Service: Cardiovascular;  Laterality: Right;  SFA   POPLITEAL ARTERY STENT  01/03/2016   Contralateral access with a 7 [place] crossover sheath (second order catheter placement)   TONSILLECTOMY AND ADENOIDECTOMY     TUMOR EXCISION Right ~ 2005   cancerous tumor removed from shoulder   TUMOR EXCISION Right ~ 2000   benign tumor removed from under shoulder   TUMOR EXCISION Left 06/02/2010   resection of Lt SFA wth interposition of Gore-Tex graft    Current Medications: Current Meds  Medication Sig   amLODipine (NORVASC) 10 MG tablet Take 10 mg by mouth daily.   atorvastatin (LIPITOR) 80 MG tablet Take 1 tablet (80 mg total) by mouth daily at 6 PM.   clopidogrel (PLAVIX) 75 MG tablet Take 1 tablet (75 mg total) by mouth daily with breakfast.    dorzolamide-timolol (COSOPT) 22.3-6.8 MG/ML ophthalmic solution Place 1 drop into both eyes 2 (two) times daily.   glipiZIDE (GLUCOTROL) 5 MG tablet Take 5 mg by mouth daily with breakfast.    hydrochlorothiazide (HYDRODIURIL) 25 MG tablet Take 25 mg by mouth every morning.   isosorbide mononitrate (IMDUR) 60 MG 24 hr tablet TAKE 1 TABLET EVERY DAY   melatonin 5 MG TABS Take 5 mg by mouth at bedtime.   metFORMIN (GLUCOPHAGE) 1000 MG tablet Take 1 tablet (1,000 mg total) by mouth 2 (two) times daily with a meal. Restart on 3/18.   metoprolol tartrate (LOPRESSOR) 100 MG tablet Take 1 tablet (100 mg total) by mouth 2 (two) times daily.   MULTIPLE VITAMIN-FOLIC ACID PO Take 1 tablet by mouth daily.   pantoprazole (PROTONIX) 20 MG tablet Take  20 mg by mouth daily.   potassium chloride SA (K-DUR,KLOR-CON) 20 MEQ tablet Take 1 tablet (20 mEq total) by mouth daily.   ranolazine (RANEXA) 1000 MG SR tablet Take 1 tablet (1,000 mg total) by mouth 2 (two) times daily.   rOPINIRole (REQUIP) 1 MG tablet Take 1 mg by mouth at bedtime.   tamsulosin (FLOMAX) 0.4 MG CAPS capsule Take 0.4 mg by mouth 2 (two) times daily.    [DISCONTINUED] rivaroxaban (XARELTO) 20 MG TABS tablet Take 1 tablet (20 mg total) by mouth daily with supper.     Allergies:   Peanut-containing drug products, Ace inhibitors, Diltiazem hcl, Meloxicam, Eliquis [apixaban], Sertraline hcl, Carvedilol, Clonidine hcl, and Duloxetine hcl   Social History   Socioeconomic History   Marital status: Married    Spouse name: Not on file   Number of children: Not on file   Years of education: Not on file   Highest education level: Not on file  Occupational History   Not on file  Tobacco Use   Smoking status: Former    Types: Pipe    Quit date: 38    Years since quitting: 50.7   Smokeless tobacco: Never   Tobacco comments:    "quit smoking in 1973"  Vaping Use   Vaping Use: Never used  Substance and Sexual Activity   Alcohol use: No    Drug use: No   Sexual activity: Not Currently  Other Topics Concern   Not on file  Social History Narrative   Not on file   Social Determinants of Health   Financial Resource Strain: Not on file  Food Insecurity: Not on file  Transportation Needs: Not on file  Physical Activity: Not on file  Stress: Not on file  Social Connections: Not on file     Family History: The patient's family history includes Coronary artery disease in his mother; Diabetes in his father and sister; Heart attack in his father and mother; Heart disease in his father, mother, and sister; Hyperlipidemia in his father; Hypertension in his mother and sister. ROS:   Please see the history of present illness.    All other systems reviewed and are negative.  EKGs/Labs/Other Studies Reviewed:    Vascular ultrasound studies 12/30/2021   Aorta and iliacs Summary:  Stenosis: +--------------------+-------------+---------------+  Location            Stenosis     Stent            +--------------------+-------------+---------------+  Right Common Iliac  <50% stenosis                 +--------------------+-------------+---------------+  Left Common Iliac   >50% stenosis                 +--------------------+-------------+---------------+  Right External Iliac<50% stenosis                 +--------------------+-------------+---------------+  Left External Iliac              50-99% stenosis  +--------------------+-------------+---------------+   Calcific atherosclerosis noted throughout the aorta and bilateral iliac arteries.   Essentially stable exam compared to prior study.   IVC/Iliac: There is no evidence of thrombus involving the IVC.    Lower extremity Summary:  Right: 75-99% stenosis noted in the superficial femoral artery. 50-74%  stenosis noted in the superficial femoral artery and/or popliteal artery.  Mild progression is noted compared to previous study. Atherosclerosis in  the  common femoral, femoral and  popliteal arteries.  Left: No significant change as compared to previous study. Atherosclerosis  in the common femoral, femoral and popliteal arteries.      See table(s) above for measurements and observations.   Suggest follow up study in 12 months.   Carotids  Summary:  Right Carotid: Velocities in the right ICA are consistent with a 40-59%                stenosis. Non-hemodynamically significant plaque <50% noted  in                the CCA.   Left Carotid: Velocities in the left ICA are consistent with a 1-39%  stenosis.               Non-hemodynamically significant plaque <50% noted in the  CCA. The               ECA appears >50% stenosed.   Vertebrals:  Bilateral vertebral arteries demonstrate antegrade flow. Subclavians: Normal flow hemodynamics were seen in bilateral subclavian              arteries.    EKG:  EKG is not ordered today.  The intracardiac electrogram shows atrial paced, ventricular sensed rhythm.  The most recent tracing from 12/20/2021 is personally reviewed i and surprisingly shows mostly AV sequentially paced rhythm at 94 bpm, with occasional PVCs and safety pacing  Recent Labs: 12/01/2021: ALT 10; BUN 20; Creatinine 0.92; Hemoglobin 10.8; Platelet Count 162; Potassium 3.8; Sodium 141  Recent Lipid Panel 04/16/2018 total cholesterol 78, HDL 31, LDL 33, triglycerides 69 06/19/2019 Total cholesterol 78, HDL 28, LDL 31, triglycerides 100 Creatinine 0.98, potassium 4.3, normal liver function tests 10/14/2019 Hemoglobin A1c 7.7% Lipid Panel     Component Value Date/Time   CHOL 88 (L) 06/30/2021 1120   TRIG 71 06/30/2021 1120   HDL 35 (L) 06/30/2021 1120   CHOLHDL 2.5 06/30/2021 1120   CHOLHDL 2.6 06/23/2020 0325   VLDL 14 06/23/2020 0325   LDLCALC 38 06/30/2021 1120   LABVLDL 15 06/30/2021 1120     Physical Exam:    VS:  BP 134/72 (BP Location: Left Arm, Patient Position: Sitting, Cuff Size: Normal)   Pulse 88   Ht  '5\' 7"'$  (1.702 m)   Wt 171 lb 3.2 oz (77.7 kg)   SpO2 96%   BMI 26.81 kg/m     Wt Readings from Last 3 Encounters:  01/09/22 171 lb 3.2 oz (77.7 kg)  12/20/21 174 lb 12.8 oz (79.3 kg)  12/01/21 176 lb 11.2 oz (80.2 kg)     General: Alert, oriented x3, no distress, healthy right subclavian pacemaker site Head: no evidence of trauma, PERRL, EOMI, no exophtalmos or lid lag, no myxedema, no xanthelasma; normal ears, nose and oropharynx Neck: normal jugular venous pulsations and no hepatojugular reflux; brisk carotid pulses without delay and no carotid bruits Chest: clear to auscultation, no signs of consolidation by percussion or palpation, normal fremitus, symmetrical and full respiratory excursions Cardiovascular: normal position and quality of the apical impulse, regular rhythm, normal first and widely split second heart sounds, no murmurs, rubs or gallops Abdomen: no tenderness or distention, no masses by palpation, no abnormal pulsatility or arterial bruits, normal bowel sounds, no hepatosplenomegaly Extremities: no clubbing, cyanosis or edema; 2+ radial, ulnar and brachial pulses bilaterally; 2+ right femoral, posterior tibial and dorsalis pedis pulses; 2+ left femoral, posterior tibial and dorsalis pedis pulses; no subclavian or femoral bruits Neurological: grossly nonfocal Psych: Normal mood and affect  ASSESSMENT:    1. Coronary artery disease of bypass graft of native heart with stable angina pectoris (HCC)   2. Paroxysmal atrial fibrillation ([place])   3. SSS (sick sinus syndrome), medtronic adapta   4. Pacemaker   5. PAD (peripheral artery disease) ([place])   6. Hypercholesterolemia   7. Essential hypertension   8. Type 2 diabetes mellitus with complication, without long-term current use of insulin ([place])   9. Acquired thrombophilia ([place])     PLAN:    In order of problems listed above:  CAD: He is currently on 3 antianginal medications (maximum dose amlodipine, isosorbide  mononitrate and high dose of beta-blocker) and does not have any angina pectoris. Parox AFib: Very low prevalence of arrhythmia.  Asymptomatic.  CHADSVasc 5 (age, hypertension, vascular disease, diabetes).   SSS: Heart rate histogram distribution suggest current sensor settings are appropriate. PPM: Continue remote downloads every 3 months.  Normal device function. PAD: Some evidence of disease progression in his lower extremities by ultrasound, but no intervention needed at this time.  On clopidogrel chronically. HLP: Excellent LDL cholesterol on statin, chronically low HDL. HTN: Good control. DM: Adequate control hemoglobin A1c 8% or less. Anticoagulation: No recent falls.  No bleeding complications.   Medication Adjustments/Labs and Tests Ordered: Current medicines are reviewed at length with the patient today.  Concerns regarding medicines are outlined above.  No orders of the defined types were placed in this encounter.  No orders of the defined types were placed in this encounter.  Patient Instructions  Medication Instructions:  No changes *If you need a refill on your cardiac medications before your next appointment, please call your pharmacy*   Lab Work: None ordered If you have labs (blood work) drawn today and your tests are completely normal, you will receive your results only by: [place] (if you have MyChart) OR A paper copy in the mail If you have any lab test that is abnormal or we need to change your treatment, we will call you to review the results.   Testing/Procedures: None ordered   Follow-Up: At [facility], you and your health needs are our priority.  As part of our continuing mission to provide you with exceptional heart care, we have created designated Provider Care Teams.  These Care Teams include your primary Cardiologist (physician) and Advanced Practice Providers (APPs -  Physician Assistants and Nurse Practitioners) who all work  together to provide you with the care you need, when you need it.  We recommend signing up for the patient portal called "MyChart".  Sign up information is provided on this After Visit Summary.  MyChart is used to connect with patients for Virtual Visits (Telemedicine).  Patients are able to view lab/test results, encounter notes, upcoming appointments, etc.  Non-urgent messages can be sent to your provider as well.   To learn more about what you can do with MyChart, go to [email or web address].    Your next appointment:   12 month(s)  The format for your next appointment:   In Person  Provider:   Dr. Sallyanne Kuster  Important Information About Sugar        Signed, Sanda Klein, MD  01/10/2022 5:47 PM    [place]

## 2022-01-09 NOTE — Patient Instructions (Signed)
Medication Instructions:  No changes *If you need a refill on your cardiac medications before your next appointment, please call your pharmacy*   Lab Work: None ordered If you have labs (blood work) drawn today and your tests are completely normal, you will receive your results only by: [place] (if you have MyChart) OR A paper copy in the mail If you have any lab test that is abnormal or we need to change your treatment, we will call you to review the results.   Testing/Procedures: None ordered   Follow-Up: At [facility], you and your health needs are our priority.  As part of our continuing mission to provide you with exceptional heart care, we have created designated Provider Care Teams.  These Care Teams include your primary Cardiologist (physician) and Advanced Practice Providers (APPs -  Physician Assistants and Nurse Practitioners) who all work together to provide you with the care you need, when you need it.  We recommend signing up for the patient portal called "MyChart".  Sign up information is provided on this After Visit Summary.  MyChart is used to connect with patients for Virtual Visits (Telemedicine).  Patients are able to view lab/test results, encounter notes, upcoming appointments, etc.  Non-urgent messages can be sent to your provider as well.   To learn more about what you can do with MyChart, go to [email or web address].    Your next appointment:   12 month(s)  The format for your next appointment:   In Person  Provider:   Dr. Sallyanne Kuster  Important Information About Sugar

## 2022-01-10 DIAGNOSIS — H35372 Puckering of macula, left eye: Secondary | ICD-10-CM | POA: Diagnosis not present

## 2022-01-10 DIAGNOSIS — H35352 Cystoid macular degeneration, left eye: Secondary | ICD-10-CM | POA: Diagnosis not present

## 2022-01-10 DIAGNOSIS — H34811 Central retinal vein occlusion, right eye, with macular edema: Secondary | ICD-10-CM | POA: Diagnosis not present

## 2022-01-10 NOTE — Telephone Encounter (Signed)
Prescription refill request for Xarelto received.  Indication:Afib Last office visit:10/23 Weight:77.7 kg Age:81 Scr:0.9 CrCl:69.55 ml/min  Prescription refilled

## 2022-02-05 ENCOUNTER — Inpatient Hospital Stay (HOSPITAL_COMMUNITY)
Admission: EM | Admit: 2022-02-05 | Discharge: 2022-02-07 | DRG: 303 | Disposition: A | Discharge status: Home / Self Care | Payer: Medicare HMO | Attending: Family Medicine | Admitting: Family Medicine

## 2022-02-05 ENCOUNTER — Emergency Department (HOSPITAL_COMMUNITY): Payer: Medicare HMO

## 2022-02-05 ENCOUNTER — Other Ambulatory Visit: Payer: Self-pay

## 2022-02-05 DIAGNOSIS — Z7902 Long term (current) use of antithrombotics/antiplatelets: Secondary | ICD-10-CM | POA: Diagnosis not present

## 2022-02-05 DIAGNOSIS — R410 Disorientation, unspecified: Secondary | ICD-10-CM

## 2022-02-05 DIAGNOSIS — R7989 Other specified abnormal findings of blood chemistry: Secondary | ICD-10-CM

## 2022-02-05 DIAGNOSIS — C9110 Chronic lymphocytic leukemia of B-cell type not having achieved remission: Secondary | ICD-10-CM | POA: Diagnosis not present

## 2022-02-05 DIAGNOSIS — G4733 Obstructive sleep apnea (adult) (pediatric): Secondary | ICD-10-CM | POA: Diagnosis present

## 2022-02-05 DIAGNOSIS — I25118 Atherosclerotic heart disease of native coronary artery with other forms of angina pectoris: Secondary | ICD-10-CM | POA: Diagnosis not present

## 2022-02-05 DIAGNOSIS — E118 Type 2 diabetes mellitus with unspecified complications: Secondary | ICD-10-CM | POA: Diagnosis present

## 2022-02-05 DIAGNOSIS — Z8249 Family history of ischemic heart disease and other diseases of the circulatory system: Secondary | ICD-10-CM

## 2022-02-05 DIAGNOSIS — Z955 Presence of coronary angioplasty implant and graft: Secondary | ICD-10-CM

## 2022-02-05 DIAGNOSIS — Z888 Allergy status to other drugs, medicaments and biological substances status: Secondary | ICD-10-CM | POA: Diagnosis not present

## 2022-02-05 DIAGNOSIS — R079 Chest pain, unspecified: Principal | ICD-10-CM

## 2022-02-05 DIAGNOSIS — Z95828 Presence of other vascular implants and grafts: Secondary | ICD-10-CM | POA: Diagnosis not present

## 2022-02-05 DIAGNOSIS — Z7984 Long term (current) use of oral hypoglycemic drugs: Secondary | ICD-10-CM | POA: Diagnosis not present

## 2022-02-05 DIAGNOSIS — Z91010 Allergy to peanuts: Secondary | ICD-10-CM

## 2022-02-05 DIAGNOSIS — Z833 Family history of diabetes mellitus: Secondary | ICD-10-CM

## 2022-02-05 DIAGNOSIS — R531 Weakness: Secondary | ICD-10-CM | POA: Diagnosis not present

## 2022-02-05 DIAGNOSIS — I25700 Atherosclerosis of coronary artery bypass graft(s), unspecified, with unstable angina pectoris: Secondary | ICD-10-CM | POA: Diagnosis not present

## 2022-02-05 DIAGNOSIS — R4182 Altered mental status, unspecified: Secondary | ICD-10-CM | POA: Diagnosis not present

## 2022-02-05 DIAGNOSIS — Z85828 Personal history of other malignant neoplasm of skin: Secondary | ICD-10-CM

## 2022-02-05 DIAGNOSIS — I25110 Atherosclerotic heart disease of native coronary artery with unstable angina pectoris: Secondary | ICD-10-CM | POA: Diagnosis not present

## 2022-02-05 DIAGNOSIS — Z886 Allergy status to analgesic agent status: Secondary | ICD-10-CM | POA: Diagnosis not present

## 2022-02-05 DIAGNOSIS — I25810 Atherosclerosis of coronary artery bypass graft(s) without angina pectoris: Secondary | ICD-10-CM | POA: Diagnosis present

## 2022-02-05 DIAGNOSIS — R778 Other specified abnormalities of plasma proteins: Secondary | ICD-10-CM | POA: Diagnosis not present

## 2022-02-05 DIAGNOSIS — I495 Sick sinus syndrome: Secondary | ICD-10-CM | POA: Diagnosis present

## 2022-02-05 DIAGNOSIS — Z951 Presence of aortocoronary bypass graft: Secondary | ICD-10-CM

## 2022-02-05 DIAGNOSIS — R319 Hematuria, unspecified: Secondary | ICD-10-CM | POA: Diagnosis present

## 2022-02-05 DIAGNOSIS — I10 Essential (primary) hypertension: Secondary | ICD-10-CM | POA: Diagnosis not present

## 2022-02-05 DIAGNOSIS — I25709 Atherosclerosis of coronary artery bypass graft(s), unspecified, with unspecified angina pectoris: Secondary | ICD-10-CM | POA: Diagnosis not present

## 2022-02-05 DIAGNOSIS — Z83438 Family history of other disorder of lipoprotein metabolism and other lipidemia: Secondary | ICD-10-CM

## 2022-02-05 DIAGNOSIS — R0789 Other chest pain: Secondary | ICD-10-CM | POA: Diagnosis not present

## 2022-02-05 DIAGNOSIS — Z79899 Other long term (current) drug therapy: Secondary | ICD-10-CM | POA: Diagnosis not present

## 2022-02-05 DIAGNOSIS — I480 Paroxysmal atrial fibrillation: Secondary | ICD-10-CM | POA: Diagnosis present

## 2022-02-05 DIAGNOSIS — I2510 Atherosclerotic heart disease of native coronary artery without angina pectoris: Secondary | ICD-10-CM | POA: Diagnosis not present

## 2022-02-05 DIAGNOSIS — G9340 Encephalopathy, unspecified: Secondary | ICD-10-CM | POA: Diagnosis present

## 2022-02-05 DIAGNOSIS — I639 Cerebral infarction, unspecified: Secondary | ICD-10-CM | POA: Diagnosis not present

## 2022-02-05 DIAGNOSIS — E785 Hyperlipidemia, unspecified: Secondary | ICD-10-CM | POA: Diagnosis not present

## 2022-02-05 DIAGNOSIS — Z87891 Personal history of nicotine dependence: Secondary | ICD-10-CM

## 2022-02-05 DIAGNOSIS — Z85820 Personal history of malignant melanoma of skin: Secondary | ICD-10-CM | POA: Diagnosis not present

## 2022-02-05 DIAGNOSIS — Z7901 Long term (current) use of anticoagulants: Secondary | ICD-10-CM

## 2022-02-05 DIAGNOSIS — E1151 Type 2 diabetes mellitus with diabetic peripheral angiopathy without gangrene: Secondary | ICD-10-CM | POA: Diagnosis not present

## 2022-02-05 DIAGNOSIS — E119 Type 2 diabetes mellitus without complications: Secondary | ICD-10-CM | POA: Diagnosis not present

## 2022-02-05 DIAGNOSIS — Z950 Presence of cardiac pacemaker: Secondary | ICD-10-CM | POA: Diagnosis not present

## 2022-02-05 DIAGNOSIS — I25708 Atherosclerosis of coronary artery bypass graft(s), unspecified, with other forms of angina pectoris: Secondary | ICD-10-CM | POA: Diagnosis not present

## 2022-02-05 DIAGNOSIS — I200 Unstable angina: Secondary | ICD-10-CM | POA: Diagnosis not present

## 2022-02-05 LAB — BASIC METABOLIC PANEL
Anion gap: 14 (ref 5–15)
BUN: 20 mg/dL (ref 8–23)
CO2: 24 mmol/L (ref 22–32)
Calcium: 9.4 mg/dL (ref 8.9–10.3)
Chloride: 104 mmol/L (ref 98–111)
Creatinine, Ser: 1.05 mg/dL (ref 0.61–1.24)
GFR, Estimated: >60 mL/min (ref >60)
Glucose, Bld: 72 mg/dL (ref 70–99)
Potassium: 4 mmol/L (ref 3.5–5.1)
Sodium: 142 mmol/L (ref 135–145)

## 2022-02-05 LAB — CBG MONITORING, ED
Glucose-Capillary: 68 mg/dL — ABNORMAL LOW (ref 70–99)
Glucose-Capillary: 92 mg/dL (ref 70–99)

## 2022-02-05 LAB — CBC
HCT: 34.8 % — ABNORMAL LOW (ref 39.0–52.0)
Hemoglobin: 11.1 g/dL — ABNORMAL LOW (ref 13.0–17.0)
MCH: 26.6 pg (ref 26.0–34.0)
MCHC: 31.9 g/dL (ref 30.0–36.0)
MCV: 83.3 fL (ref 80.0–100.0)
Platelets: 176 10*3/uL (ref 150–400)
RBC: 4.18 MIL/uL — ABNORMAL LOW (ref 4.22–5.81)
RDW: 16.1 % — ABNORMAL HIGH (ref 11.5–15.5)
WBC: 16.8 10*3/uL — ABNORMAL HIGH (ref 4.0–10.5)
nRBC: 0 % (ref 0.0–0.2)

## 2022-02-05 LAB — TROPONIN I (HIGH SENSITIVITY)
Troponin I (High Sensitivity): 150 ng/L (ref <18)
Troponin I (High Sensitivity): 176 ng/L (ref <18)

## 2022-02-05 NOTE — ED Triage Notes (Signed)
Pt here from home w family for gradually worsening weakness w/ AMS x1 week. Per daughter, pt has had abnormal gait - stumbles, but no falls, intermittent confusion where pt can't follow conversation. Pt reports he feels like his head is "in water", denies HA. Pt also reports intermittent chest pains, hx of 16 stents and triple bypass.

## 2022-02-05 NOTE — ED Notes (Signed)
Pt given orange juice and crackers for cbg 68. Pt reports only having a cup of coffee today.

## 2022-02-06 ENCOUNTER — Encounter (HOSPITAL_COMMUNITY): Payer: Self-pay | Admitting: Internal Medicine

## 2022-02-06 DIAGNOSIS — Z85828 Personal history of other malignant neoplasm of skin: Secondary | ICD-10-CM | POA: Diagnosis not present

## 2022-02-06 DIAGNOSIS — Z79899 Other long term (current) drug therapy: Secondary | ICD-10-CM | POA: Diagnosis not present

## 2022-02-06 DIAGNOSIS — I200 Unstable angina: Secondary | ICD-10-CM

## 2022-02-06 DIAGNOSIS — G4733 Obstructive sleep apnea (adult) (pediatric): Secondary | ICD-10-CM

## 2022-02-06 DIAGNOSIS — I2510 Atherosclerotic heart disease of native coronary artery without angina pectoris: Secondary | ICD-10-CM

## 2022-02-06 DIAGNOSIS — I10 Essential (primary) hypertension: Secondary | ICD-10-CM | POA: Diagnosis present

## 2022-02-06 DIAGNOSIS — I480 Paroxysmal atrial fibrillation: Secondary | ICD-10-CM

## 2022-02-06 DIAGNOSIS — Z7984 Long term (current) use of oral hypoglycemic drugs: Secondary | ICD-10-CM | POA: Diagnosis not present

## 2022-02-06 DIAGNOSIS — Z95828 Presence of other vascular implants and grafts: Secondary | ICD-10-CM | POA: Diagnosis not present

## 2022-02-06 DIAGNOSIS — Z83438 Family history of other disorder of lipoprotein metabolism and other lipidemia: Secondary | ICD-10-CM | POA: Diagnosis not present

## 2022-02-06 DIAGNOSIS — Z950 Presence of cardiac pacemaker: Secondary | ICD-10-CM | POA: Diagnosis not present

## 2022-02-06 DIAGNOSIS — Z951 Presence of aortocoronary bypass graft: Secondary | ICD-10-CM | POA: Diagnosis not present

## 2022-02-06 DIAGNOSIS — G9340 Encephalopathy, unspecified: Secondary | ICD-10-CM | POA: Diagnosis present

## 2022-02-06 DIAGNOSIS — C9110 Chronic lymphocytic leukemia of B-cell type not having achieved remission: Secondary | ICD-10-CM

## 2022-02-06 DIAGNOSIS — I25708 Atherosclerosis of coronary artery bypass graft(s), unspecified, with other forms of angina pectoris: Secondary | ICD-10-CM

## 2022-02-06 DIAGNOSIS — Z888 Allergy status to other drugs, medicaments and biological substances status: Secondary | ICD-10-CM | POA: Diagnosis not present

## 2022-02-06 DIAGNOSIS — I25700 Atherosclerosis of coronary artery bypass graft(s), unspecified, with unstable angina pectoris: Secondary | ICD-10-CM

## 2022-02-06 DIAGNOSIS — I639 Cerebral infarction, unspecified: Secondary | ICD-10-CM | POA: Diagnosis not present

## 2022-02-06 DIAGNOSIS — Z87891 Personal history of nicotine dependence: Secondary | ICD-10-CM | POA: Diagnosis not present

## 2022-02-06 DIAGNOSIS — Z955 Presence of coronary angioplasty implant and graft: Secondary | ICD-10-CM | POA: Diagnosis not present

## 2022-02-06 DIAGNOSIS — I25709 Atherosclerosis of coronary artery bypass graft(s), unspecified, with unspecified angina pectoris: Secondary | ICD-10-CM

## 2022-02-06 DIAGNOSIS — R7989 Other specified abnormal findings of blood chemistry: Secondary | ICD-10-CM | POA: Diagnosis not present

## 2022-02-06 DIAGNOSIS — Z8249 Family history of ischemic heart disease and other diseases of the circulatory system: Secondary | ICD-10-CM | POA: Diagnosis not present

## 2022-02-06 DIAGNOSIS — R079 Chest pain, unspecified: Secondary | ICD-10-CM | POA: Diagnosis not present

## 2022-02-06 DIAGNOSIS — E785 Hyperlipidemia, unspecified: Secondary | ICD-10-CM

## 2022-02-06 DIAGNOSIS — Z85820 Personal history of malignant melanoma of skin: Secondary | ICD-10-CM | POA: Diagnosis not present

## 2022-02-06 DIAGNOSIS — I25118 Atherosclerotic heart disease of native coronary artery with other forms of angina pectoris: Secondary | ICD-10-CM | POA: Diagnosis not present

## 2022-02-06 DIAGNOSIS — I25110 Atherosclerotic heart disease of native coronary artery with unstable angina pectoris: Secondary | ICD-10-CM | POA: Diagnosis present

## 2022-02-06 DIAGNOSIS — Z7902 Long term (current) use of antithrombotics/antiplatelets: Secondary | ICD-10-CM | POA: Diagnosis not present

## 2022-02-06 DIAGNOSIS — E1151 Type 2 diabetes mellitus with diabetic peripheral angiopathy without gangrene: Secondary | ICD-10-CM | POA: Diagnosis present

## 2022-02-06 DIAGNOSIS — I495 Sick sinus syndrome: Secondary | ICD-10-CM | POA: Diagnosis present

## 2022-02-06 DIAGNOSIS — E118 Type 2 diabetes mellitus with unspecified complications: Secondary | ICD-10-CM

## 2022-02-06 DIAGNOSIS — Z886 Allergy status to analgesic agent status: Secondary | ICD-10-CM | POA: Diagnosis not present

## 2022-02-06 DIAGNOSIS — Z91010 Allergy to peanuts: Secondary | ICD-10-CM | POA: Diagnosis not present

## 2022-02-06 LAB — URINALYSIS, ROUTINE W REFLEX MICROSCOPIC
Bacteria, UA: NONE SEEN
Bilirubin Urine: NEGATIVE
Glucose, UA: >=500 mg/dL — AB
Hgb urine dipstick: NEGATIVE
Ketones, ur: NEGATIVE mg/dL
Nitrite: NEGATIVE
Protein, ur: 30 mg/dL — AB
Specific Gravity, Urine: 1.009 (ref 1.005–1.030)
pH: 5 (ref 5.0–8.0)

## 2022-02-06 LAB — HEMOGLOBIN A1C
Hgb A1c MFr Bld: 6.8 % — ABNORMAL HIGH (ref 4.8–5.6)
Mean Plasma Glucose: 148.46 mg/dL

## 2022-02-06 LAB — CBG MONITORING, ED
Glucose-Capillary: 285 mg/dL — ABNORMAL HIGH (ref 70–99)
Glucose-Capillary: 151 mg/dL — ABNORMAL HIGH (ref 70–99)
Glucose-Capillary: 215 mg/dL — ABNORMAL HIGH (ref 70–99)
Glucose-Capillary: 128 mg/dL — ABNORMAL HIGH (ref 70–99)
Glucose-Capillary: 130 mg/dL — ABNORMAL HIGH (ref 70–99)
Glucose-Capillary: 223 mg/dL — ABNORMAL HIGH (ref 70–99)

## 2022-02-06 LAB — GLUCOSE, CAPILLARY: Glucose-Capillary: 141 mg/dL — ABNORMAL HIGH (ref 70–99)

## 2022-02-06 MED ORDER — ASPIRIN 81 MG PO CHEW
324.0000 mg | CHEWABLE_TABLET | Freq: Once | ORAL | Status: AC
  Administered 2022-02-06: 324 mg via ORAL
  Filled 2022-02-06: qty 4

## 2022-02-06 MED ORDER — ISOSORBIDE MONONITRATE ER 60 MG PO TB24
60.0000 mg | ORAL_TABLET | Freq: Every day | ORAL | Status: DC
  Administered 2022-02-06 – 2022-02-07 (×2): 60 mg via ORAL
  Filled 2022-02-06: qty 1
  Filled 2022-02-06: qty 2

## 2022-02-06 MED ORDER — HEPARIN (PORCINE) 25000 UT/250ML-% IV SOLN
900.0000 [IU]/h | INTRAVENOUS | Status: DC
  Administered 2022-02-06: 900 [IU]/h via INTRAVENOUS
  Filled 2022-02-06: qty 250

## 2022-02-06 MED ORDER — ONDANSETRON HCL 4 MG/2ML IJ SOLN: 4.0000 mg | Freq: Four times a day (QID) | INTRAMUSCULAR | Status: DC | PRN

## 2022-02-06 MED ORDER — ASPIRIN 81 MG PO TBEC: 81.0000 mg | DELAYED_RELEASE_TABLET | Freq: Every day | ORAL | Status: DC

## 2022-02-06 MED ORDER — DORZOLAMIDE HCL-TIMOLOL MAL 2-0.5 % OP SOLN
1.0000 [drp] | Freq: Two times a day (BID) | OPHTHALMIC | Status: DC
  Administered 2022-02-06 – 2022-02-07 (×3): 1 [drp] via OPHTHALMIC
  Filled 2022-02-06: qty 10

## 2022-02-06 MED ORDER — ACETAMINOPHEN 325 MG PO TABS: 650.0000 mg | ORAL_TABLET | ORAL | Status: DC | PRN

## 2022-02-06 MED ORDER — RIVAROXABAN 20 MG PO TABS
20.0000 mg | ORAL_TABLET | Freq: Every day | ORAL | Status: DC
  Administered 2022-02-06: 20 mg via ORAL
  Filled 2022-02-06: qty 2

## 2022-02-06 MED ORDER — RANOLAZINE ER 500 MG PO TB12
500.0000 mg | ORAL_TABLET | Freq: Two times a day (BID) | ORAL | Status: DC
  Administered 2022-02-06 – 2022-02-07 (×3): 500 mg via ORAL
  Filled 2022-02-06 (×3): qty 1

## 2022-02-06 MED ORDER — CLOPIDOGREL BISULFATE 75 MG PO TABS
75.0000 mg | ORAL_TABLET | Freq: Every day | ORAL | Status: DC
  Administered 2022-02-06: 75 mg via ORAL
  Filled 2022-02-06: qty 1

## 2022-02-06 MED ORDER — METOPROLOL TARTRATE 100 MG PO TABS
100.0000 mg | ORAL_TABLET | Freq: Two times a day (BID) | ORAL | Status: DC
  Administered 2022-02-06 – 2022-02-07 (×3): 100 mg via ORAL
  Filled 2022-02-06 (×2): qty 4
  Filled 2022-02-06 (×2): qty 1

## 2022-02-06 MED ORDER — POTASSIUM CHLORIDE CRYS ER 20 MEQ PO TBCR
20.0000 meq | EXTENDED_RELEASE_TABLET | Freq: Every day | ORAL | Status: DC
  Administered 2022-02-06 – 2022-02-07 (×2): 20 meq via ORAL
  Filled 2022-02-06 (×2): qty 1

## 2022-02-06 MED ORDER — HYDROCHLOROTHIAZIDE 25 MG PO TABS
25.0000 mg | ORAL_TABLET | Freq: Every day | ORAL | Status: DC
  Administered 2022-02-06 – 2022-02-07 (×2): 25 mg via ORAL
  Filled 2022-02-06 (×2): qty 1

## 2022-02-06 MED ORDER — MELATONIN 5 MG PO TABS
5.0000 mg | ORAL_TABLET | Freq: Every day | ORAL | Status: DC
  Administered 2022-02-06: 5 mg via ORAL
  Filled 2022-02-06: qty 1

## 2022-02-06 MED ORDER — HEPARIN (PORCINE) 25000 UT/250ML-% IV SOLN: 900.0000 [IU]/h | INTRAVENOUS | Status: DC

## 2022-02-06 MED ORDER — NITROGLYCERIN 0.4 MG SL SUBL: 0.4000 mg | SUBLINGUAL_TABLET | SUBLINGUAL | Status: DC | PRN

## 2022-02-06 MED ORDER — AMLODIPINE BESYLATE 10 MG PO TABS
10.0000 mg | ORAL_TABLET | Freq: Every day | ORAL | Status: DC
  Administered 2022-02-06 – 2022-02-07 (×2): 10 mg via ORAL
  Filled 2022-02-06: qty 1
  Filled 2022-02-06: qty 2

## 2022-02-06 MED ORDER — INSULIN ASPART 100 UNIT/ML IJ SOLN
0.0000 [IU] | INTRAMUSCULAR | Status: DC
  Administered 2022-02-06: 3 [IU] via SUBCUTANEOUS
  Administered 2022-02-06: 1 [IU] via SUBCUTANEOUS
  Administered 2022-02-06: 5 [IU] via SUBCUTANEOUS
  Administered 2022-02-06: 1 [IU] via SUBCUTANEOUS
  Administered 2022-02-06: 3 [IU] via SUBCUTANEOUS
  Administered 2022-02-06: 2 [IU] via SUBCUTANEOUS
  Administered 2022-02-07 (×2): 1 [IU] via SUBCUTANEOUS

## 2022-02-06 MED ORDER — ATORVASTATIN CALCIUM 80 MG PO TABS
80.0000 mg | ORAL_TABLET | Freq: Every day | ORAL | Status: DC
  Administered 2022-02-06: 80 mg via ORAL
  Filled 2022-02-06: qty 2

## 2022-02-06 MED ORDER — TAMSULOSIN HCL 0.4 MG PO CAPS
0.4000 mg | ORAL_CAPSULE | Freq: Two times a day (BID) | ORAL | Status: DC
  Administered 2022-02-06 – 2022-02-07 (×3): 0.4 mg via ORAL
  Filled 2022-02-06 (×3): qty 1

## 2022-02-06 NOTE — Assessment & Plan Note (Addendum)
UA / NSTEMI with positive trops, CP.  Very extensive h/o CAD including CABG, numerous stents.  Got stenting with last LHC in 2020. 1. ACS pathway 2. Cont DAPT 3. Hold home xarelto, put pt on heparin gtt instead 4. NPO 5. Cards to consult, they wanted medicine to admit 6. May need another LHC 7. Start SL NTG PRN 8. Cont Imdur and Ranexa 9. Cont home statin 1. See dyslipidemia discussion below 10. Tele monitor

## 2022-02-06 NOTE — Progress Notes (Signed)
    Pt's pacemaker is NOT MRI compatible.    Cecilie Kicks, FNP-C [facility] [place]  [phone] or after 5pm and on weekends call [phone] 02/06/2022.now

## 2022-02-06 NOTE — Plan of Care (Signed)
  Problem: Education: Goal: Understanding of cardiac disease, CV risk reduction, and recovery process will improve Outcome: Progressing   Problem: Activity: Goal: Ability to tolerate increased activity will improve Outcome: Progressing   Problem: Cardiac: Goal: Ability to achieve and maintain adequate cardiovascular perfusion will improve Outcome: Progressing   Problem: Health Behavior/Discharge Planning: Goal: Ability to safely manage health-related needs after discharge will improve Outcome: Progressing   Problem: Education: Goal: Ability to describe self-care measures that may prevent or decrease complications (Diabetes Survival Skills Education) will improve Outcome: Progressing   Problem: Coping: Goal: Ability to adjust to condition or change in health will improve Outcome: Progressing   Problem: Fluid Volume: Goal: Ability to maintain a balanced intake and output will improve Outcome: Progressing   Problem: Health Behavior/Discharge Planning: Goal: Ability to identify and utilize available resources and services will improve Outcome: Progressing Goal: Ability to manage health-related needs will improve Outcome: Progressing   Problem: Metabolic: Goal: Ability to maintain appropriate glucose levels will improve Outcome: Progressing   Problem: Nutritional: Goal: Maintenance of adequate nutrition will improve Outcome: Progressing Goal: Progress toward achieving an optimal weight will improve Outcome: Progressing   Problem: Skin Integrity: Goal: Risk for impaired skin integrity will decrease Outcome: Progressing   Problem: Tissue Perfusion: Goal: Adequacy of tissue perfusion will improve Outcome: Progressing

## 2022-02-06 NOTE — Assessment & Plan Note (Addendum)
Intermittent symptoms of lightheadedness / AMS over past couple of weeks. CT head neg Seems to have some delay to word finding on today's exam, Stroke / TIAs possible but even if present, patients heart clearly will not tolerate permissive HTN at the moment.  Therefore will treat BPs in setting of ACS / unstable angina. 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 Spoke with neurology: they just recommended getting repeat CTH in 24h, ill get this ordered for tomorrow.

## 2022-02-06 NOTE — Consult Note (Addendum)
Cardiology Consultation   Patient ID: [name] MRN: [id number]; DOB: 06/06/1940  Admit date: 02/05/2022 Date of Consult: 02/06/2022  PCP:  [name], MD   [facility] Cardiologist:  Quay Burow, MD  Cardiology APP:  Ledora Bottcher, [place]  Electrophysiologist:  Sanda Klein, MD       Patient Profile:   Robert Burns is a 81 y.o. male with a hx of bradycardia, PPM, CAD, CABG, stents and PAD, HLD, HTN, DM, PAF anticoagulation, OSA. CLL who is being seen 02/06/2022 for the evaluation of chest pain at the request of Dr. Horris Latino.[name]  History of Present Illness:   Robert Burns with above hx including complex CAD : multiple revascularization procedures (bypass surgery 2004, drug-eluting stent to SVG-PDA and SVG to diagonal in 2016, drug-eluting stents x2 to LAD and in-stent angioplasty for restenosis SVG-diagonal and proximal limb of sequential SVG-OM 02/08/2018) and PAD (angioplasty left SFA 2004 and stent September 2017, right SFA November 2017).  He has normal left ventricular systolic function by echo last performed 02/07/2018, with "posterior basal hypokinesis".  He has biatrial dilation and right ventricular dilation by echo.  His last nuclear study in 2018 showed normal perfusion with EF of 42%, but subsequent echo showed EF normal at 55%.  Stent 2019 with cath that  showed multiple coronary stenoses involving both the native and graft vessels.  He was evaluated for redo bypass surgery but felt to be a poor candidate and subsequently underwent 2 DES to LAD and PTCA to restenotic lesions previously placed stents to have his vein grafts (SVG-diagonal, proximal limb of sequential SVG-OM-PLV with distal occluded).   He does have occlusion of native RCA and several lesions in VG to RCA including an 80% distal stenosis.   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 Donnetta Hutching.     Cardiac cath 06/22/20   He has symptomatic SB and received PPM 2007 and gen change 2017 (Medtronic Adapta) last interrogated 01/09/22.  Hx of PAF with low prevalence of arrhythmia, CHA2DS2VASc of 5 on Xarelto 20 mg.  PCI to mSVG to diag graft and PTCA to areas of restenosis in this graft along with PTCA to pSVG to RCA instent restenosis  severe calcium, If he has recurrent restenosis, consider Shockwave IVL. (maximum dose amlodipine, isosorbide mononitrate and high dose of beta-blocker and Ranexa.    Pt presented to ER with AMS, swimmy headed and dizzy, intermittent confusion and word difficulty in addition to increased angina.  Admitted and xarelto held and IV heparin started,  CT head neg.  Possible TIA    EKG:  The EKG was personally reviewed and demonstrates:  atrially paced and V sensed and LBBB no acute ST changes Telemetry:  Telemetry was personally reviewed and demonstrates:  A paced.    Na 142, K+ 4.0 BUN 20 Cr 1.05  hs troponin 150 and 176 WBC 16.8 Hgb 11 plts 176 2V CXR NAD   BP 173/76 P 62 R 18 aafebrile  Past Medical History:  Diagnosis Date   Arthritis    "all over" (11/25/2015)   CAD (coronary artery disease)    a. s/p CABG  06/2002; b. 02/01/15 PCI: DES to prox SVG to PDA, staged PCI of SVG to Diag in 02/2015; c. 04/2017 Cath/PCI: LM nl, LAD 100ost, 58m 75d, LCX 60ost, OM2 80, RCA 100ost, RPDA 80, LIMA->LAD  ok, VG->D1 patent stent, VG->RPDA patent stent, 50p, VG->OM1->OM2 90p (3.0x24 Synergy DES), 100 between OM1->OM2 (med rx).   Cancer [facility])    Right Shoulder, Left Leg- BCC, SCC, AND MELANOMA   Epithelioid hemangioendothelioma    s/p resection of left SFA/mass with interposition 6 mm GoreTex graft 06/02/10, s/p repeat resection for positive margins 09/22/10   Hyperlipidemia    Hypertension    Leg pain    OSA on CPAP    PAD (peripheral artery disease) ([place])    a. 10/2002 L SFA PTA/BMS; b. 8/17 LE Angio: LEIA 90 (9x40 self exp stent), LSFA short segment prox  occlusion (staged PTA/stenting 01/03/2016), patent mid stent, RSFA 39m(staged PTA/DEB 02/14/2016).   Presence of permanent cardiac pacemaker    Medtronic   SSS (sick sinus syndrome) ([place]    a. s/p PPM in 2007 with gen change 04/2015 - Medtronic Adapta ADDRL1, ser # [id number].   Type II diabetes mellitus (HCC)    Type II   Urgency of urination     Past Surgical History:  Procedure Laterality Date   CARDIAC CATHETERIZATION N/A 02/01/2015   Procedure: Left Heart Cath and Coronary Angiography;  Surgeon: JLorretta Harp MD;  [facility];  Service: Cardiovascular;  Laterality: N/A;   CARDIAC CATHETERIZATION N/A 02/01/2015   Procedure: Coronary Stent Intervention;  Surgeon: JLorretta Harp MD;  [place];  Service: Cardiovascular;  Laterality: N/A;   CARDIAC CATHETERIZATION  06/2002   "just before bypass OR"   CARDIAC CATHETERIZATION N/A 03/01/2015   Procedure: Coronary Stent Intervention;  Surgeon: JLorretta Harp MD;  [facility];  Service: Cardiovascular;  Laterality: N/A;   CARDIAC CATHETERIZATION  02/06/2018   COLONOSCOPY     CORONARY ANGIOPLASTY     CORONARY ARTERY BYPASS GRAFT  06/2002   x5, LIMA-LAD;VG- Diag; seq VG- ramus & OM branch; VG-PDA   CORONARY STENT INTERVENTION N/A 04/12/2017   Procedure: CORONARY STENT INTERVENTION;  Surgeon: BLorretta Harp MD;  [place] [facility];  Service: Cardiovascular;  Laterality: N/A;   CORONARY STENT INTERVENTION N/A 02/08/2018   Procedure: CORONARY STENT INTERVENTION;  Surgeon: VJettie Booze MD;  [facility];  Service: Cardiovascular;  Laterality: N/A;   CORONARY STENT INTERVENTION N/A 11/12/2018   Procedure: CORONARY STENT INTERVENTION;  Surgeon: AWellington Hampshire MD;  [facility];  Service: Cardiovascular;  Laterality: N/A;   CORONARY STENT INTERVENTION N/A 06/22/2020   Procedure: CORONARY STENT INTERVENTION;  Surgeon: VJettie Booze MD;  [facility];  Service: Cardiovascular;  Laterality: N/A;   EP IMPLANTABLE DEVICE N/A 04/23/2015   Procedure: PPM Generator Changeout;  Surgeon: MSanda Klein MD;  [facility];  Service: Cardiovascular;  Laterality: N/A;   FALSE ANEURYSM REPAIR Left 11/29/2018   Procedure: REPAIR FALSE ANEURYSM LEFT RADIAL ARTERY;  Surgeon: ERosetta Posner MD;  [place]  Service: Vascular;  Laterality: Left;   FEMORAL ARTERY STENT Left ~ 2014   "taken out of my leg; couldn' catorgorize what kind so the put it under all 3"; cataroziepitheloid hemanioendotheliomau   FOOT FRACTURE SURGERY Left [place]/ REPLACE / REMOVE PACEMAKER  10/13/05   right side, medtronic Adapta   KNEE HARDWARE REMOVAL Right 1950's   "3-4 months after the insertion"   KNEE SURGERY Right 1950's   "broke my lower leg; had to put  pin in my knee to keep lower leg in place til it healed"   LEFT HEART CATH AND CORS/GRAFTS ANGIOGRAPHY N/A 04/12/2017   Procedure: LEFT HEART CATH AND CORS/GRAFTS ANGIOGRAPHY;  Surgeon: Lorretta Harp, MD;  Location: [place] [facility];  Service: Cardiovascular;  Laterality: N/A;   LEFT HEART CATH AND CORS/GRAFTS ANGIOGRAPHY N/A 02/06/2018   Procedure: LEFT HEART CATH AND CORS/GRAFTS ANGIOGRAPHY;  Surgeon: Troy Sine, MD;  Location: [place] [facility];  Service: Cardiovascular;  Laterality: N/A;   LEFT HEART CATH AND CORS/GRAFTS ANGIOGRAPHY N/A 11/12/2018   Procedure: LEFT HEART CATH AND CORS/GRAFTS ANGIOGRAPHY;  Surgeon: Wellington Hampshire, MD;  Location: [place] [facility];  Service: Cardiovascular;  Laterality: N/A;   LEFT HEART CATH AND CORS/GRAFTS ANGIOGRAPHY N/A 06/22/2020   Procedure: LEFT HEART CATH AND CORS/GRAFTS ANGIOGRAPHY;  Surgeon: Jettie Booze, MD;  Location: [place] [facility];  Service: Cardiovascular;  Laterality: N/A;   PERIPHERAL VASCULAR CATHETERIZATION N/A 11/25/2015   Procedure: Lower Extremity Angiography;   Surgeon: Lorretta Harp, MD;  Location: [place] [facility];  Service: Cardiovascular;  Laterality: N/A;   PERIPHERAL VASCULAR CATHETERIZATION Left 11/25/2015   Procedure: Peripheral Vascular Intervention;  Surgeon: Lorretta Harp, MD;  Location: [place] [facility];  Service: Cardiovascular;  Laterality: Left;  external iliac   PERIPHERAL VASCULAR CATHETERIZATION N/A 01/03/2016   Procedure: Lower Extremity Angiography;  Surgeon: Lorretta Harp, MD;  Location: [place] [facility];  Service: Cardiovascular;  Laterality: N/A;   PERIPHERAL VASCULAR CATHETERIZATION Left 01/03/2016   Procedure: Peripheral Vascular Intervention;  Surgeon: Lorretta Harp, MD;  Location: [place] [facility];  Service: Cardiovascular;  Laterality: Left;  SFA   PERIPHERAL VASCULAR CATHETERIZATION Right 02/14/2016   Procedure: Peripheral Vascular Atherectomy;  Surgeon: Lorretta Harp, MD;  Location: [place] [facility];  Service: Cardiovascular;  Laterality: Right;  SFA   POPLITEAL ARTERY STENT  01/03/2016   Contralateral access with a 7 [place] crossover sheath (second order catheter placement)   TONSILLECTOMY AND ADENOIDECTOMY     TUMOR EXCISION Right ~ 2005   cancerous tumor removed from shoulder   TUMOR EXCISION Right ~ 2000   benign tumor removed from under shoulder   TUMOR EXCISION Left 06/02/2010   resection of Lt SFA wth interposition of Gore-Tex graft     Home Medications:  Prior to Admission medications   Medication Sig Start Date End Date Taking? Authorizing Provider  amLODipine (NORVASC) 10 MG tablet Take 10 mg by mouth daily.   Yes [provider]  atorvastatin (LIPITOR) 80 MG tablet Take 1 tablet (80 mg total) by mouth daily at 6 PM. 02/09/18  Yes Eileen Stanford, PA-C  clopidogrel (PLAVIX) 75 MG tablet Take 1 tablet (75 mg total) by mouth daily with breakfast. 06/24/20  Yes Duke, Tami Lin, PA  dorzolamide-timolol (COSOPT) 22.3-6.8 MG/ML ophthalmic solution Place 1 drop into both eyes 2  (two) times daily. 04/30/20  Yes [provider]  glipiZIDE (GLUCOTROL) 5 MG tablet Take 5 mg by mouth daily with breakfast.  01/10/18  Yes [provider]  hydrochlorothiazide (HYDRODIURIL) 25 MG tablet Take 25 mg by mouth every morning.   Yes [provider]  isosorbide mononitrate (IMDUR) 60 MG 24 hr tablet TAKE 1 TABLET EVERY DAY Patient taking differently: Take 60 mg by mouth daily. 10/05/21  Yes Lorretta Harp, MD  melatonin 5 MG TABS Take 5 mg by mouth at bedtime.   Yes [provider]  metFORMIN (GLUCOPHAGE) 1000 MG tablet Take 1 tablet (1,000 mg total) by mouth 2 (two) times daily with a meal. Restart on 3/18. 06/23/20  Yes Duke, Tami Lin, PA  metoprolol tartrate (LOPRESSOR) 100 MG tablet Take 1 tablet (100 mg total) by mouth 2 (two) times daily. 08/07/17  Yes Lendon Colonel, NP  MULTIPLE VITAMIN-FOLIC ACID PO Take 1 tablet by mouth daily.   Yes [provider]  nitroGLYCERIN (NITROSTAT) 0.4 MG SL tablet Place 1 tablet (0.4 mg total) under the tongue every 5 (five) minutes as needed for chest pain. 04/15/17  Yes Theora Gianotti, NP  potassium chloride SA (K-DUR,KLOR-CON) 20 MEQ tablet Take 1 tablet (20 mEq total) by mouth daily. 02/18/16  Yes Regalado, Belkys A, MD  ranolazine (RANEXA) 500 MG 12 hr tablet Take 500 mg by mouth 2 (two) times daily. 01/09/22  Yes [provider]  rivaroxaban (XARELTO) 20 MG TABS tablet TAKE 1 TABLET BY MOUTH DAILY WITH SUPPER 01/10/22  Yes Croitoru, Mihai, MD  tamsulosin (FLOMAX) 0.4 MG CAPS capsule Take 0.4 mg by mouth 2 (two) times daily.  03/09/15  Yes [provider]  hydrALAZINE (APRESOLINE) 25 MG tablet Take 1 tablet (25 mg total) by mouth as needed (for BP >150 sys or 90 dia). Patient not taking: Reported on 01/09/2022 09/23/20   Deberah Pelton, NP  ketorolac (ACULAR) 0.4 % SOLN Place 1 drop into both eyes 4 (four) times daily. Patient not taking: Reported on 12/20/2021 06/01/20    [provider]    Inpatient Medications: Scheduled Meds:  amLODipine  10 mg Oral Daily   [START ON 02/07/2022] aspirin EC  81 mg Oral Daily   atorvastatin  80 mg Oral q1800   clopidogrel  75 mg Oral Q breakfast   dorzolamide-timolol  1 drop Both Eyes BID   hydrochlorothiazide  25 mg Oral BH-q7a   insulin aspart  0-9 Units Subcutaneous Q4H   isosorbide mononitrate  60 mg Oral Daily   melatonin  5 mg Oral QHS   metoprolol tartrate  100 mg Oral BID   potassium chloride SA  20 mEq Oral Daily   ranolazine  500 mg Oral BID   tamsulosin  0.4 mg Oral BID   Continuous Infusions:  heparin     PRN Meds: acetaminophen, nitroGLYCERIN, ondansetron (ZOFRAN) IV  Allergies:    Allergies  Allergen Reactions   Peanut-Containing Drug Products Anaphylaxis and Other (See Comments)    Tongue swelling is severe    Ace Inhibitors Cough        Diltiazem Hcl Other (See Comments)    UNSPECIFIED REACTION     Meloxicam Other (See Comments)    GI upset    Eliquis [Apixaban] Other (See Comments)    dizziness   Sertraline Hcl Other (See Comments)   Carvedilol Itching   Clonidine Hcl Other (See Comments)    Patch only - skin irritation   Duloxetine Hcl Other (See Comments)    Urinary frequency    Social History:   Social History   Socioeconomic History   Marital status: Married    Spouse name: Not on file   Number of children: Not on file   Years of education: Not on file   Highest education level: Not on file  Occupational History   Not on file  Tobacco Use   Smoking status: Former    Types: Pipe    Quit date: 1973    Years since quitting: 50.8   Smokeless tobacco: Never  Tobacco comments:    "quit smoking in 1973"  Vaping Use   Vaping Use: Never used  Substance and Sexual Activity   Alcohol use: No   Drug use: No   Sexual activity: Not Currently  Other Topics Concern   Not on file  Social History Narrative   Not on file   Social Determinants of Health    Financial Resource Strain: Not on file  Food Insecurity: Not on file  Transportation Needs: Not on file  Physical Activity: Not on file  Stress: Not on file  Social Connections: Not on file  Intimate Partner Violence: Not on file    Family History:    Family History  Problem Relation Age of Onset   Coronary artery disease Mother    Heart attack Mother    Hypertension Mother    Heart disease Mother        Open  Heart surgery   Diabetes Father    Heart disease Father    Hyperlipidemia Father    Heart attack Father    Hypertension Sister    Diabetes Sister    Heart disease Sister      ROS:  Please see the history of present illness.  General:no colds or fevers, no weight changes Skin:no rashes or ulcers HEENT:no blurred vision, no congestion CV:see HPI PUL:see HPI GI:no diarrhea constipation or melena, no indigestion GU:no hematuria, no dysuria MS:no joint pain, no claudication Neuro:no syncope, no lightheadedness Endo:+ diabetes, no thyroid disease  All other ROS reviewed and negative.     Physical Exam/Data:   Vitals:   02/06/22 0415 02/06/22 0430 02/06/22 0445 02/06/22 0500  BP: (!) 175/84 (!) 174/73 (!) 173/72   Pulse: 72 60 60   Resp: '17 20 19   '$ Temp:      TempSrc:      SpO2: 98% 96% 97%   Weight:    77.6 kg  Height:    '5\' 7"'$  (1.702 m)   No intake or output data in the 24 hours ending 02/06/22 0631    02/06/2022    5:00 AM 01/09/2022   11:31 AM 12/20/2021   10:00 AM  Last 3 Weights  Weight (lbs) 171 lb 171 lb 3.2 oz 174 lb 12.8 oz  Weight (kg) 77.565 kg 77.656 kg 79.289 kg     Body mass index is 26.78 kg/m.   No distress Elderly male No murmur Post sternotomy PPM under left clavicle Abdomen soft Palpable pedal pusles Neuro non focal   Relevant CV Studies:  Aorta and iliacs Summary:  Stenosis: +--------------------+-------------+---------------+  Location            Stenosis     Stent             +--------------------+-------------+---------------+  Right Common Iliac  <50% stenosis                 +--------------------+-------------+---------------+  Left Common Iliac   >50% stenosis                 +--------------------+-------------+---------------+  Right External Iliac<50% stenosis                 +--------------------+-------------+---------------+  Left External Iliac              50-99% stenosis  +--------------------+-------------+---------------+   Calcific atherosclerosis noted throughout the aorta and bilateral iliac arteries.   Essentially stable exam compared to prior study.   IVC/Iliac: There is no evidence of thrombus involving the IVC.  Lower extremity Summary:  Right: 75-99% stenosis noted in the superficial femoral artery. 50-74%  stenosis noted in the superficial femoral artery and/or popliteal artery.  Mild progression is noted compared to previous study. Atherosclerosis in  the common femoral, femoral and  popliteal arteries.   Left: No significant change as compared to previous study. Atherosclerosis  in the common femoral, femoral and popliteal arteries.      See table(s) above for measurements and observations.   Suggest follow up study in 12 months.    Carotids   Summary:  Right Carotid: Velocities in the right ICA are consistent with a 40-59%                stenosis. Non-hemodynamically significant plaque <50% noted  in                the CCA.   Left Carotid: Velocities in the left ICA are consistent with a 1-39%  stenosis.               Non-hemodynamically significant plaque <50% noted in the  CCA. The               ECA appears >50% stenosed.   Vertebrals:  Bilateral vertebral arteries demonstrate antegrade flow. Subclavians: Normal flow hemodynamics were seen in bilateral subclavian              arteries.   Cardiac cath 06/22/21 Mid LM lesion is 50% stenosed. Ost LAD to Prox LAD lesion is 100% stenosed. LIMA to  LAD is patent. Distal LAD stent appears better compared to prior study in 2020. Ost Cx lesion is 40% stenosed. Prox Cx lesion is 30% stenosed. Ost 3rd Mrg to 3rd Mrg lesion is 99% stenosed. SVG to OM is occluded. Mid Cx lesion is 75% stenosed. Ost Ramus lesion is 95% stenosed. Areas of in stent restenosis in the SVG to diag graft: Prox Graft lesion is 70% stenosed, Mid Graft to Dist Graft lesion is 70% stenosed. Scoring balloon angioplasty was performed using a BALLOON WOLVERINE 3.00X10. Post intervention, there is a 0% residual stenosis in the distal stent. Post intervention, there is a 30% residual stenosis in the proximal stent. SVG to diagonal Mid Graft lesion is 90% stenosed. A drug-eluting stent was successfully placed using a STENT RESOLUTE ONYX 3.0X12. IC verapamil given prior to stent placement. Post intervention, there is a 0% residual stenosis. Ost RCA to Prox RCA lesion is 100% stenosed. Origin to Prox SVG to PDA Graft lesion is 80% stenosed and scoring Balloon angioplasty was performed, with a 3.25 [place] cutting balloon. Post intervention, there is a 25% residual stenosis. The left ventricular systolic function is normal. LV end diastolic pressure is normal. The left ventricular ejection fraction is 50-55% by visual estimate. There is no aortic valve stenosis.   Successful PCI to the mid SVG to diagonal graft and PTCA to areas of restenosis in this graft.    PTCA to the proximal SVG to RCA in stent restenosis.  Severe calcium.  If he has recurrent restenosis, consider Shockwave IVL.    Plavix for 6 months.  Restart Xarelto in AM.     Diagnostic Dominance: Right  Intervention   Laboratory Data:  High Sensitivity Troponin:   Recent Labs  Lab 02/05/22 1725 02/05/22 2053  TROPONINIHS 150* 176*     Chemistry Recent Labs  Lab 02/05/22 1725  NA 142  K 4.0  CL 104  CO2 24  GLUCOSE 72  BUN 20  CREATININE 1.05  CALCIUM 9.4  GFRNONAA >60  ANIONGAP 14    No  results for input(s): "PROT", "ALBUMIN", "AST", "ALT", "ALKPHOS", "BILITOT" in the last 168 hours. Lipids No results for input(s): "CHOL", "TRIG", "HDL", "LABVLDL", "LDLCALC", "CHOLHDL" in the last 168 hours.  Hematology Recent Labs  Lab 02/05/22 1725  WBC 16.8*  RBC 4.18*  HGB 11.1*  HCT 34.8*  MCV 83.3  MCH 26.6  MCHC 31.9  RDW 16.1*  PLT 176   Thyroid No results for input(s): "TSH", "FREET4" in the last 168 hours.  BNPNo results for input(s): "BNP", "PROBNP" in the last 168 hours.  DDimer No results for input(s): "DDIMER" in the last 168 hours.   Radiology/Studies:  CT HEAD WO CONTRAST  Result Date: 02/05/2022 CLINICAL DATA:  Altered mental status. EXAM: CT HEAD WITHOUT CONTRAST TECHNIQUE: Contiguous axial images were obtained from the base of the skull through the vertex without intravenous contrast. RADIATION DOSE REDUCTION: This exam was performed according to the departmental dose-optimization program which includes automated exposure control, adjustment of the mA and/or kV according to patient size and/or use of iterative reconstruction technique. COMPARISON:  Head CT dated 08/09/2021. FINDINGS: Brain: Moderate age-related atrophy and chronic microvascular ischemic changes. There is no acute intracranial hemorrhage. No mass effect or midline shift. No extra-axial fluid collection. Vascular: No hyperdense vessel or unexpected calcification. Skull: Normal. Negative for fracture or focal lesion. Sinuses/Orbits: No acute finding. Other: None IMPRESSION: 1. No acute intracranial pathology. 2. Moderate age-related atrophy and chronic microvascular ischemic changes. Electronically Signed   By: Anner Crete M.D.   On: 02/05/2022 18:13   DG Chest 2 View  Result Date: 02/05/2022 CLINICAL DATA:  Chest pain and weakness EXAM: CHEST - 2 VIEW COMPARISON:  Chest x-ray 04/28/2021 FINDINGS: Sternotomy wires and mediastinal clips are again seen. Right-sided pacemaker is unchanged in position.  The heart size and mediastinal contours are within normal limits. Both lungs are clear. The visualized skeletal structures are unremarkable. IMPRESSION: No active cardiopulmonary disease. Electronically Signed   By: Ronney Asters M.D.   On: 02/05/2022 17:52     Assessment and Plan:   CAD/CABG:  He describes normal pattern of angina taking nitro 3-4 times / month no prolonged episodes Limited targets for revascularization mild elevation in troponin would continue medical Rx Do not think he needs another cath this admission as chest pain was not his main reason for coming to ER PPM normal function in sinus V pacing so ECG not interpretable for ischemia He has a Comptroller generator but old leads will ask EP But don't think system is MRI compatible  PAF:  ok to resume DOAC no plans for invasive cath in sinus with V pacing back up PVD:  stable ambulatory and still drives as well  Neuro:  per primary service has felt foggy headed and off balance CT head no bleed    Risk Assessment/Risk Scores:     TIMI Risk Score for Unstable Angina or Non-ST Elevation MI:   The patient's TIMI risk score is  , which indicates a  % risk of all cause mortality, new or recurrent myocardial infarction or need for urgent revascularization in the next 14 days.    CHA2DS2-VASc Score = 4   This indicates a 4.8% annual risk of stroke. The patient's score is based upon: CHF History: 0 HTN History: 1 Diabetes History: 0 Stroke History: 0 Vascular Disease History: 1 Age Score: 2 Gender Score: 0     Jenkins Rouge MD [facility]

## 2022-02-06 NOTE — Assessment & Plan Note (Signed)
Hold home metformin and glipizide Sensitive SSI Q4H for the moment.

## 2022-02-06 NOTE — Progress Notes (Addendum)
   Follow Up Note  HPI: Please see full H&P done earlier this morning  Briefly, patient with extensive CAD status post CABG with about 15 prior stents, history of PAD, SSS s/p PPM presented to the ED complaining of AMS, lightheadedness/dizziness, intermittent confusion with word finding difficulty as well as intermittent chronic chest pain.  CT head unremarkable, patient unable to have MRI due to incompatible pacemaker.  Cardiology consulted, with no further recommendations/work-up.  Neurology was curb sided by admitting physician, recommended to repeat CT head in 24 hours.   Saw patient this morning, denied any new complaints.  Reports symptoms have improved, denies any current worsening chest pain, shortness of breath, abdominal pain, nausea/vomiting, fever/chills.  Exam: General: NAD  Cardiovascular: S1, S2 present Respiratory: CTAB Abdomen: Soft, nontender, nondistended, bowel sounds present Musculoskeletal: No bilateral pedal edema noted Skin: Normal Psychiatry: Normal mood  Neurology: No obvious focal neurologic deficits noted, strength equal in all extremities, sensation intact   Present on Admission:  Unstable angina (HCC)  CAD, multiple vessel  CAD (coronary artery disease) of bypass graft  Dyslipidemia (high LDL; low HDL)  Essential hypertension  Paroxysmal atrial fibrillation (HCC)  Acute encephalopathy  Type 2 diabetes mellitus with complication, without long-term current use of insulin (HCC)  CLL (chronic lymphocytic leukemia) (HCC)   Elevated troponin, patient seen by cardiology, signed off with no further recommendations. Neurology recommends to repeat CT 24 hours, order placed (???  Early phase of mild cognitive impairment, ??  Vascular Vs TIA/CVA) PT eval pending Possible discharge if repeat CT stable

## 2022-02-06 NOTE — ED Notes (Signed)
ED TO INPATIENT HANDOFF REPORT  ED Nurse Name and Phone #: Casey Burkitt [phone]  S Name/Age/Gender Robert Burns 81 y.o. male Room/Bed: 022C/022C  Code Status   Code Status: Full Code  Home/SNF/Other Home Patient oriented to: self, place, time, and situation Is this baseline? Yes   Triage Complete: Triage complete  Chief Complaint Unstable angina ([place]) [I20.0]  Triage Note Pt here from home w family for gradually worsening weakness w/ AMS x1 week. Per daughter, pt has had abnormal gait - stumbles, but no falls, intermittent confusion where pt can't follow conversation. Pt reports he feels like his head is "in water", denies HA. Pt also reports intermittent chest pains, hx of 16 stents and triple bypass.    Allergies Allergies  Allergen Reactions   Peanut-Containing Drug Products Anaphylaxis and Other (See Comments)    Tongue swelling is severe    Ace Inhibitors Cough        Diltiazem Hcl Other (See Comments)    UNSPECIFIED REACTION     Meloxicam Other (See Comments)    GI upset    Eliquis [Apixaban] Other (See Comments)    dizziness   Sertraline Hcl Other (See Comments)   Carvedilol Itching   Clonidine Hcl Other (See Comments)    Patch only - skin irritation   Duloxetine Hcl Other (See Comments)    Urinary frequency    Level of Care/Admitting Diagnosis ED Disposition     ED Disposition  Admit   Condition  --   [place]: [place] [[id number]]  Level of Care: Progressive [102]  Admit to Progressive based on following criteria: CARDIOVASCULAR & THORACIC of moderate stability with acute coronary syndrome symptoms/low risk myocardial infarction/hypertensive urgency/arrhythmias/heart failure potentially compromising stability and stable post cardiovascular intervention patients.  May admit patient to [facility] or [facility] if equivalent level of care is available:: No  Covid Evaluation: Asymptomatic - no recent exposure (last  10 days) testing not required  Diagnosis: Unstable angina [facility]) [[id number]]  Admitting Physician: Etta Quill [[id number]]  Attending Physician: Etta Quill [[id number]]  Certification:: I certify this patient will need inpatient services for at least 2 midnights  Estimated Length of Stay: 3          B Medical/Surgery History Past Medical History:  Diagnosis Date   Arthritis    "all over" (11/25/2015)   CAD (coronary artery disease)    a. s/p CABG  06/2002; b. 02/01/15 PCI: DES to prox SVG to PDA, staged PCI of SVG to Diag in 02/2015; c. 04/2017 Cath/PCI: LM nl, LAD 100ost, 38m 75d, LCX 60ost, OM2 80, RCA 100ost, RPDA 80, LIMA->LAD ok, VG->D1 patent stent, VG->RPDA patent stent, 50p, VG->OM1->OM2 90p (3.0x24 Synergy DES), 100 between OM1->OM2 (med rx).   Cancer ([facility]    Right Shoulder, Left Leg- BCC, SCC, AND MELANOMA   Epithelioid hemangioendothelioma    s/p resection of left SFA/mass with interposition 6 mm GoreTex graft 06/02/10, s/p repeat resection for positive margins 09/22/10   Hyperlipidemia    Hypertension    Leg pain    OSA on CPAP    PAD (peripheral artery disease) ([place]    a. 10/2002 L SFA PTA/BMS; b. 8/17 LE Angio: LEIA 90 (9x40 self exp stent), LSFA short segment prox occlusion (staged PTA/stenting 01/03/2016), patent mid stent, RSFA 955mstaged PTA/DEB 02/14/2016).   Presence of permanent cardiac pacemaker    Medtronic   SSS (sick sinus syndrome) (HCC)    a. s/p PPM  in 2007 with gen change 04/2015 - Medtronic Adapta ADDRL1, ser # [id number] H.   Type II diabetes mellitus (HCC)    Type II   Urgency of urination    Past Surgical History:  Procedure Laterality Date   CARDIAC CATHETERIZATION N/A 02/01/2015   Procedure: Left Heart Cath and Coronary Angiography;  Surgeon: Lorretta Harp, MD;  Location: [place] [facility];  Service: Cardiovascular;  Laterality: N/A;   CARDIAC CATHETERIZATION N/A 02/01/2015   Procedure: Coronary Stent Intervention;  Surgeon: Lorretta Harp, MD;   Location: [place] [facility];  Service: Cardiovascular;  Laterality: N/A;   CARDIAC CATHETERIZATION  06/2002   "just before bypass OR"   CARDIAC CATHETERIZATION N/A 03/01/2015   Procedure: Coronary Stent Intervention;  Surgeon: Lorretta Harp, MD;  Location: [place] [facility];  Service: Cardiovascular;  Laterality: N/A;   CARDIAC CATHETERIZATION  02/06/2018   COLONOSCOPY     CORONARY ANGIOPLASTY     CORONARY ARTERY BYPASS GRAFT  06/2002   x5, LIMA-LAD;VG- Diag; seq VG- ramus & OM branch; VG-PDA   CORONARY STENT INTERVENTION N/A 04/12/2017   Procedure: CORONARY STENT INTERVENTION;  Surgeon: Lorretta Harp, MD;  Location: [place] [facility];  Service: Cardiovascular;  Laterality: N/A;   CORONARY STENT INTERVENTION N/A 02/08/2018   Procedure: CORONARY STENT INTERVENTION;  Surgeon: Jettie Booze, MD;  Location: [place] [facility];  Service: Cardiovascular;  Laterality: N/A;   CORONARY STENT INTERVENTION N/A 11/12/2018   Procedure: CORONARY STENT INTERVENTION;  Surgeon: Wellington Hampshire, MD;  Location: [place] [facility];  Service: Cardiovascular;  Laterality: N/A;   CORONARY STENT INTERVENTION N/A 06/22/2020   Procedure: CORONARY STENT INTERVENTION;  Surgeon: Jettie Booze, MD;  Location: [place] [facility];  Service: Cardiovascular;  Laterality: N/A;   EP IMPLANTABLE DEVICE N/A 04/23/2015   Procedure: PPM Generator Changeout;  Surgeon: Sanda Klein, MD;  Location: [place] [facility];  Service: Cardiovascular;  Laterality: N/A;   FALSE ANEURYSM REPAIR Left 11/29/2018   Procedure: REPAIR FALSE ANEURYSM LEFT RADIAL ARTERY;  Surgeon: [name], MD;  Location: [place];  Service: Vascular;  Laterality: Left;   FEMORAL ARTERY STENT Left ~ 2014   "taken out of my leg; couldn' catorgorize what kind so the put it under all 3"; cataroziepitheloid hemanioendotheliomau   FOOT FRACTURE SURGERY Left [place] / REPLACE / REMOVE PACEMAKER  10/13/05   right side,  medtronic Adapta   KNEE HARDWARE REMOVAL Right 1950's   "3-4 months after the insertion"   KNEE SURGERY Right 1950's   "broke my lower leg; had to put pin in my knee to keep lower leg in place til it healed"   LEFT HEART CATH AND CORS/GRAFTS ANGIOGRAPHY N/A 04/12/2017   Procedure: LEFT HEART CATH AND CORS/GRAFTS ANGIOGRAPHY;  Surgeon: Lorretta Harp, MD;  Location: [place] [facility];  Service: Cardiovascular;  Laterality: N/A;   LEFT HEART CATH AND CORS/GRAFTS ANGIOGRAPHY N/A 02/06/2018   Procedure: LEFT HEART CATH AND CORS/GRAFTS ANGIOGRAPHY;  Surgeon: Troy Sine, MD;  Location: [place] [facility];  Service: Cardiovascular;  Laterality: N/A;   LEFT HEART CATH AND CORS/GRAFTS ANGIOGRAPHY N/A 11/12/2018   Procedure: LEFT HEART CATH AND CORS/GRAFTS ANGIOGRAPHY;  Surgeon: Wellington Hampshire, MD;  Location: [place] [facility];  Service: Cardiovascular;  Laterality: N/A;   LEFT HEART CATH AND CORS/GRAFTS ANGIOGRAPHY N/A 06/22/2020   Procedure: LEFT HEART CATH AND CORS/GRAFTS ANGIOGRAPHY;  Surgeon: Jettie Booze, MD;  Location: [place] [facility];  Service: Cardiovascular;  Laterality: N/A;   PERIPHERAL VASCULAR CATHETERIZATION N/A 11/25/2015   Procedure: Lower Extremity Angiography;  Surgeon: Lorretta Harp, MD;  Location: [place] [facility];  Service: Cardiovascular;  Laterality: N/A;   PERIPHERAL VASCULAR CATHETERIZATION Left 11/25/2015   Procedure: Peripheral Vascular Intervention;  Surgeon: Lorretta Harp, MD;  Location: [place] [facility];  Service: Cardiovascular;  Laterality: Left;  external iliac   PERIPHERAL VASCULAR CATHETERIZATION N/A 01/03/2016   Procedure: Lower Extremity Angiography;  Surgeon: Lorretta Harp, MD;  Location: [place] [facility];  Service: Cardiovascular;  Laterality: N/A;   PERIPHERAL VASCULAR CATHETERIZATION Left 01/03/2016   Procedure: Peripheral Vascular Intervention;  Surgeon: Lorretta Harp, MD;  Location: [place] [facility];  Service: Cardiovascular;   Laterality: Left;  SFA   PERIPHERAL VASCULAR CATHETERIZATION Right 02/14/2016   Procedure: Peripheral Vascular Atherectomy;  Surgeon: Lorretta Harp, MD;  Location: [place] [facility];  Service: Cardiovascular;  Laterality: Right;  SFA   POPLITEAL ARTERY STENT  01/03/2016   Contralateral access with a 7 [place] crossover sheath (second order catheter placement)   TONSILLECTOMY AND ADENOIDECTOMY     TUMOR EXCISION Right ~ 2005   cancerous tumor removed from shoulder   TUMOR EXCISION Right ~ 2000   benign tumor removed from under shoulder   TUMOR EXCISION Left 06/02/2010   resection of Lt SFA wth interposition of Gore-Tex graft     A IV Location/Drains/Wounds Patient Lines/Drains/Airways Status     Active Line/Drains/Airways     Name Placement date Placement time Site Days   Peripheral IV 02/06/22 18 G 1.16" Anterior;Right Forearm 02/06/22  0550  Forearm  less than 1   Peripheral IV 02/06/22 18 G 1.16" Anterior;Left;Proximal Forearm 02/06/22  0550  Forearm  less than 1            Intake/Output Last 24 hours  Intake/Output Summary (Last 24 hours) at 02/06/2022 1906 Last data filed at 02/06/2022 1455 Gross per 24 hour  Intake --  Output 750 ml  Net -750 ml    Labs/Imaging Results for orders placed or performed during the hospital encounter of 02/05/22 (from the past 48 hour(s))  CBG monitoring, ED     Status: Abnormal   Collection Time: 02/05/22  5:14 PM  Result Value Ref Range   Glucose-Capillary 68 (L) 70 - 99 mg/dL    Comment: Glucose reference range applies only to samples taken after fasting for at least 8 hours.  Basic metabolic panel     Status: None   Collection Time: 02/05/22  5:25 PM  Result Value Ref Range   Sodium 142 135 - 145 mmol/L   Potassium 4.0 3.5 - 5.1 mmol/L   Chloride 104 98 - 111 mmol/L   CO2 24 22 - 32 mmol/L   Glucose, Bld 72 70 - 99 mg/dL    Comment: Glucose reference range applies only to samples taken after fasting for at least 8 hours.    BUN 20 8 - 23 mg/dL   Creatinine, Ser 1.05 0.61 - 1.24 mg/dL   Calcium 9.4 8.9 - 10.3 mg/dL   GFR, Estimated >60 >60 mL/min    Comment: (NOTE) Calculated using the CKD-EPI Creatinine Equation (2021)    Anion gap 14 5 - 15    Comment: Performed at [facility] [address], [place], [place] [address]  CBC     Status: Abnormal  Collection Time: 02/05/22  5:25 PM  Result Value Ref Range   WBC 16.8 (H) 4.0 - 10.5 K/uL   RBC 4.18 (L) 4.22 - 5.81 MIL/uL   Hemoglobin 11.1 (L) 13.0 - 17.0 g/dL   HCT 34.8 (L) 39.0 - 52.0 %   MCV 83.3 80.0 - 100.0 fL   MCH 26.6 26.0 - 34.0 pg   MCHC 31.9 30.0 - 36.0 g/dL   RDW 16.1 (H) 11.5 - 15.5 %   Platelets 176 150 - 400 K/uL   nRBC 0.0 0.0 - 0.2 %    Comment: Performed at [facility] [address] [place], [place] [address]  Troponin I (High Sensitivity)     Status: Abnormal   Collection Time: 02/05/22  5:25 PM  Result Value Ref Range   Troponin I (High Sensitivity) 150 (HH) <18 ng/L    Comment: CRITICAL RESULT CALLED TO, READ BACK BY AND VERIFIED WITH M,COCHRANE RN '@[id number]'$  02/05/22 E,[place] (NOTE) Elevated high sensitivity troponin I (hsTnI) values and significant  changes across serial measurements may suggest ACS but many other  chronic and acute conditions are known to elevate hsTnI results.  Refer to the Links section for chest pain algorithms and additional  guidance. Performed at [facility], [place] [address], [place], [place] [address]   CBG monitoring, ED     Status: None   Collection Time: 02/05/22  5:35 PM  Result Value Ref Range   Glucose-Capillary 92 70 - 99 mg/dL    Comment: Glucose reference range applies only to samples taken after fasting for at least 8 hours.  Troponin I (High Sensitivity)     Status: Abnormal   Collection Time: 02/05/22  8:53 PM  Result Value Ref Range   Troponin I (High Sensitivity) 176 (HH) <18 ng/L    Comment: CRITICAL VALUE NOTED. VALUE IS CONSISTENT WITH PREVIOUSLY  REPORTED/CALLED VALUE (NOTE) Elevated high sensitivity troponin I (hsTnI) values and significant  changes across serial measurements may suggest ACS but many other  chronic and acute conditions are known to elevate hsTnI results.  Refer to the "Links" section for chest pain algorithms and additional  guidance. Performed at [facility], [place] [address], [place], [place] [address]   Hemoglobin A1c     Status: Abnormal   Collection Time: 02/06/22  3:42 AM  Result Value Ref Range   Hgb A1c MFr Bld 6.8 (H) 4.8 - 5.6 %    Comment: (NOTE) Pre diabetes:          5.7%-6.4%  Diabetes:              >6.4%  Glycemic control for   <7.0% adults with diabetes    Mean Plasma Glucose 148.46 mg/dL    Comment: Performed at [facility] [address], [place], [place] [address]  Urinalysis, Routine w reflex microscopic Urine, Clean Catch     Status: Abnormal   Collection Time: 02/06/22  5:00 AM  Result Value Ref Range   Color, Urine YELLOW YELLOW   APPearance CLEAR CLEAR   Specific Gravity, Urine 1.009 1.005 - 1.030   pH 5.0 5.0 - 8.0   Glucose, UA >=500 (A) NEGATIVE mg/dL   Hgb urine dipstick NEGATIVE NEGATIVE   Bilirubin Urine NEGATIVE NEGATIVE   Ketones, ur NEGATIVE NEGATIVE mg/dL   Protein, ur 30 (A) NEGATIVE mg/dL   Nitrite NEGATIVE NEGATIVE   Leukocytes,Ua TRACE (A) NEGATIVE   RBC / HPF 0-5 0 - 5 RBC/hpf  WBC, UA 0-5 0 - 5 WBC/hpf   Bacteria, UA NONE SEEN NONE SEEN   Squamous Epithelial / LPF 0-5 0 - 5    Comment: Performed at [facility], [place] [address], [place], [place] [address]  CBG monitoring, ED     Status: Abnormal   Collection Time: 02/06/22  5:39 AM  Result Value Ref Range   Glucose-Capillary 285 (H) 70 - 99 mg/dL    Comment: Glucose reference range applies only to samples taken after fasting for at least 8 hours.  CBG monitoring, ED     Status: Abnormal   Collection Time: 02/06/22  7:39 AM  Result Value Ref Range   Glucose-Capillary 151 (H) 70 - 99  mg/dL    Comment: Glucose reference range applies only to samples taken after fasting for at least 8 hours.  CBG monitoring, ED     Status: Abnormal   Collection Time: 02/06/22 11:13 AM  Result Value Ref Range   Glucose-Capillary 215 (H) 70 - 99 mg/dL    Comment: Glucose reference range applies only to samples taken after fasting for at least 8 hours.  CBG monitoring, ED     Status: Abnormal   Collection Time: 02/06/22  3:36 PM  Result Value Ref Range   Glucose-Capillary 128 (H) 70 - 99 mg/dL    Comment: Glucose reference range applies only to samples taken after fasting for at least 8 hours.  CBG monitoring, ED     Status: Abnormal   Collection Time: 02/06/22  4:39 PM  Result Value Ref Range   Glucose-Capillary 130 (H) 70 - 99 mg/dL    Comment: Glucose reference range applies only to samples taken after fasting for at least 8 hours.   CT HEAD WO CONTRAST  Result Date: 02/05/2022 CLINICAL DATA:  Altered mental status. EXAM: CT HEAD WITHOUT CONTRAST TECHNIQUE: Contiguous axial images were obtained from the base of the skull through the vertex without intravenous contrast. RADIATION DOSE REDUCTION: This exam was performed according to the departmental dose-optimization program which includes automated exposure control, adjustment of the mA and/or kV according to patient size and/or use of iterative reconstruction technique. COMPARISON:  Head CT dated 08/09/2021. FINDINGS: Brain: Moderate age-related atrophy and chronic microvascular ischemic changes. There is no acute intracranial hemorrhage. No mass effect or midline shift. No extra-axial fluid collection. Vascular: No hyperdense vessel or unexpected calcification. Skull: Normal. Negative for fracture or focal lesion. Sinuses/Orbits: No acute finding. Other: None IMPRESSION: 1. No acute intracranial pathology. 2. Moderate age-related atrophy and chronic microvascular ischemic changes. Electronically Signed   By: Anner Crete M.D.   On:  02/05/2022 18:13   DG Chest 2 View  Result Date: 02/05/2022 CLINICAL DATA:  Chest pain and weakness EXAM: CHEST - 2 VIEW COMPARISON:  Chest x-ray 04/28/2021 FINDINGS: Sternotomy wires and mediastinal clips are again seen. Right-sided pacemaker is unchanged in position. The heart size and mediastinal contours are within normal limits. Both lungs are clear. The visualized skeletal structures are unremarkable. IMPRESSION: No active cardiopulmonary disease. Electronically Signed   By: Ronney Asters M.D.   On: 02/05/2022 17:52    Pending Labs Unresulted Labs (From admission, onward)     Start     Ordered   02/07/22 0500  CBC  Tomorrow morning,   R        02/06/22 1525   02/07/22 6269  Basic metabolic panel  Tomorrow morning,   R        02/06/22 1525  02/06/22 0500  Lipoprotein A (LPA)  Tomorrow morning,   R        02/06/22 0459            Vitals/Pain Today's Vitals   02/06/22 1400 02/06/22 1415 02/06/22 1507 02/06/22 1726  BP: 137/68 (!) 140/72  (!) 159/74  Pulse: 60 (!) 59  63  Resp: (!) '21 20  19  '$ Temp:   98.1 F (36.7 C)   TempSrc:   Oral   SpO2: 97% 98%  95%  Weight:      Height:      PainSc:        Isolation Precautions No active isolations  Medications Medications  aspirin EC tablet 81 mg (has no administration in time range)  nitroGLYCERIN (NITROSTAT) SL tablet 0.4 mg (has no administration in time range)  acetaminophen (TYLENOL) tablet 650 mg (has no administration in time range)  ondansetron (ZOFRAN) injection 4 mg (has no administration in time range)  atorvastatin (LIPITOR) tablet 80 mg (80 mg Oral Given 02/06/22 1707)  insulin aspart (novoLOG) injection 0-9 Units (1 Units Subcutaneous Given 02/06/22 1707)  clopidogrel (PLAVIX) tablet 75 mg (75 mg Oral Given 02/06/22 0744)  amLODipine (NORVASC) tablet 10 mg (10 mg Oral Given 02/06/22 1015)  dorzolamide-timolol (COSOPT) 2-0.5 % ophthalmic solution 1 drop (1 drop Both Eyes Given 02/06/22 1017)   hydrochlorothiazide (HYDRODIURIL) tablet 25 mg (25 mg Oral Given 02/06/22 1016)  isosorbide mononitrate (IMDUR) 24 hr tablet 60 mg (60 mg Oral Given 02/06/22 1016)  melatonin tablet 5 mg (has no administration in time range)  metoprolol tartrate (LOPRESSOR) tablet 100 mg (100 mg Oral Given 02/06/22 1016)  potassium chloride SA (KLOR-CON M) CR tablet 20 mEq (20 mEq Oral Given 02/06/22 1016)  ranolazine (RANEXA) 12 hr tablet 500 mg (500 mg Oral Given 02/06/22 1015)  tamsulosin (FLOMAX) capsule 0.4 mg (0.4 mg Oral Given 02/06/22 1015)  rivaroxaban (XARELTO) tablet 20 mg (20 mg Oral Given 02/06/22 1428)  aspirin chewable tablet 324 mg (324 mg Oral Given 02/06/22 0546)    Mobility walks High fall risk   Focused Assessments Cardiac Assessment Handoff:    Lab Results  Component Value Date   TROPONINI 4.76 ([place]) 04/14/2017   No results found for: "DDIMER" Does the Patient currently have chest pain? No    R Recommendations: See Admitting Provider Note  Report given to:   Additional Notes:

## 2022-02-06 NOTE — Assessment & Plan Note (Signed)
Pt with known h/o CLL, presumably the cause of his chronic mild leukocytosis, WBC of 16 today appears baseline and unchanged from priors.

## 2022-02-06 NOTE — Assessment & Plan Note (Signed)
Cont home BP meds Add NTG given unstable angina / NSTEMI.

## 2022-02-06 NOTE — H&P (Signed)
History and Physical    Patient: Robert Burns UJW:[id number] DOB: 04/27/1940 DOA: 02/05/2022 DOS: the patient was seen and examined on 02/06/2022 PCP: [name], MD  Patient coming from: Home  Chief Complaint:  Chief Complaint  Patient presents with   Altered Mental Status   Weakness   Chest Pain   HPI: Robert Burns is a 81 y.o. male with medical history significant of extensive CAD, s/p CABG, 15-16 prior stents, HLD, HTN, DM2, OSA, PAD, SSS s/p PPM.  PAD mostly stable but slight worsening in iliac / femoral artery noted on [place] in Sept.  Also has 40-59% R carotid stenosis, L carotid in the 1-39% range.  Last LHC that I can see was 2020, got 2 stents at that time, not felt to be candidate for redo of CABG.  Pt in to ED with c/o AMS, feeling swimmy headed and dizzy, intermittent confusion this week, word finding difficulty.  Also having intermittent chest pains as well c/w cardiac angina.  No unilateral weakness, numbness.    Review of Systems: As mentioned in the history of present illness. All other systems reviewed and are negative. Past Medical History:  Diagnosis Date   Arthritis    "all over" (11/25/2015)   CAD (coronary artery disease)    a. s/p CABG  06/2002; b. 02/01/15 PCI: DES to prox SVG to PDA, staged PCI of SVG to Diag in 02/2015; c. 04/2017 Cath/PCI: LM nl, LAD 100ost, 70m 75d, LCX 60ost, OM2 80, RCA 100ost, RPDA 80, LIMA->LAD ok, VG->D1 patent stent, VG->RPDA patent stent, 50p, VG->OM1->OM2 90p (3.0x24 Synergy DES), 100 between OM1->OM2 (med rx).   Cancer ([facility]    Right Shoulder, Left Leg- BCC, SCC, AND MELANOMA   Epithelioid hemangioendothelioma    s/p resection of left SFA/mass with interposition 6 mm GoreTex graft 06/02/10, s/p repeat resection for positive margins 09/22/10   Hyperlipidemia    Hypertension    Leg pain    OSA on CPAP    PAD (peripheral artery disease) ([facility]    a. 10/2002 L SFA PTA/BMS; b. 8/17 LE Angio: LEIA 90 (9x40 self exp stent),  LSFA short segment prox occlusion (staged PTA/stenting 01/03/2016), patent mid stent, RSFA 981mstaged PTA/DEB 02/14/2016).   Presence of permanent cardiac pacemaker    Medtronic   SSS [place]sick [place] syndrome) [place]   a. s/p PPM in 2007 with gen change 04/2015 - Medtronic Adapta ADDRL1, ser # [id number].   Type II diabetes mellitus (HCC)    Type II   Urgency of urination    Past Surgical History:  Procedure Laterality Date   CARDIAC CATHETERIZATION N/A 02/01/2015   Procedure: Left Heart Cath and Coronary Angiography;  Surgeon: JoLorretta HarpMD;  [facility];  Service: Cardiovascular;  Laterality: N/A;   CARDIAC CATHETERIZATION N/A 02/01/2015   Procedure: Coronary Stent Intervention;  Surgeon: JoLorretta HarpMD;  [facility];  Service: Cardiovascular;  Laterality: N/A;   CARDIAC CATHETERIZATION  06/2002   "just before bypass OR"   CARDIAC CATHETERIZATION N/A 03/01/2015   Procedure: Coronary Stent Intervention;  Surgeon: JoLorretta HarpMD;  [facility];  Service: Cardiovascular;  Laterality: N/A;   CARDIAC CATHETERIZATION  02/06/2018   COLONOSCOPY     CORONARY ANGIOPLASTY     CORONARY ARTERY BYPASS GRAFT  06/2002   x5, LIMA-LAD;VG- Diag; seq VG- ramus & OM branch; VG-PDA   CORONARY STENT INTERVENTION N/A 04/12/2017   Procedure: CORONARY STENT INTERVENTION;  Surgeon: Lorretta Harp, MD;  Location: [place] [facility];  Service: Cardiovascular;  Laterality: N/A;   CORONARY STENT INTERVENTION N/A 02/08/2018   Procedure: CORONARY STENT INTERVENTION;  Surgeon: Jettie Booze, MD;  Location: [place] [facility];  Service: Cardiovascular;  Laterality: N/A;   CORONARY STENT INTERVENTION N/A 11/12/2018   Procedure: CORONARY STENT INTERVENTION;  Surgeon: Wellington Hampshire, MD;  Location: [place] [facility];  Service: Cardiovascular;  Laterality: N/A;   CORONARY STENT INTERVENTION N/A 06/22/2020   Procedure: CORONARY STENT INTERVENTION;   Surgeon: Jettie Booze, MD;  Location: [place] [facility];  Service: Cardiovascular;  Laterality: N/A;   EP IMPLANTABLE DEVICE N/A 04/23/2015   Procedure: PPM Generator Changeout;  Surgeon: Sanda Klein, MD;  Location: [place] [facility];  Service: Cardiovascular;  Laterality: N/A;   FALSE ANEURYSM REPAIR Left 11/29/2018   Procedure: REPAIR FALSE ANEURYSM LEFT RADIAL ARTERY;  Surgeon: [name], MD;  Location: [place];  Service: Vascular;  Laterality: Left;   FEMORAL ARTERY STENT Left ~ 2014   "taken out of my leg; couldn' catorgorize what kind so the put it under all 3"; cataroziepitheloid hemanioendotheliomau   FOOT FRACTURE SURGERY Left [place] / REPLACE / REMOVE PACEMAKER  10/13/05   right side, medtronic Adapta   KNEE HARDWARE REMOVAL Right 1950's   "3-4 months after the insertion"   KNEE SURGERY Right 1950's   "broke my lower leg; had to put pin in my knee to keep lower leg in place til it healed"   LEFT HEART CATH AND CORS/GRAFTS ANGIOGRAPHY N/A 04/12/2017   Procedure: LEFT HEART CATH AND CORS/GRAFTS ANGIOGRAPHY;  Surgeon: Lorretta Harp, MD;  Location: [place] [facility];  Service: Cardiovascular;  Laterality: N/A;   LEFT HEART CATH AND CORS/GRAFTS ANGIOGRAPHY N/A 02/06/2018   Procedure: LEFT HEART CATH AND CORS/GRAFTS ANGIOGRAPHY;  Surgeon: Troy Sine, MD;  Location: [place] [facility];  Service: Cardiovascular;  Laterality: N/A;   LEFT HEART CATH AND CORS/GRAFTS ANGIOGRAPHY N/A 11/12/2018   Procedure: LEFT HEART CATH AND CORS/GRAFTS ANGIOGRAPHY;  Surgeon: Wellington Hampshire, MD;  Location: [place] [facility];  Service: Cardiovascular;  Laterality: N/A;   LEFT HEART CATH AND CORS/GRAFTS ANGIOGRAPHY N/A 06/22/2020   Procedure: LEFT HEART CATH AND CORS/GRAFTS ANGIOGRAPHY;  Surgeon: Jettie Booze, MD;  Location: [place] [facility];  Service: Cardiovascular;  Laterality: N/A;   PERIPHERAL VASCULAR CATHETERIZATION N/A 11/25/2015   Procedure: Lower  Extremity Angiography;  Surgeon: Lorretta Harp, MD;  Location: [place] [facility];  Service: Cardiovascular;  Laterality: N/A;   PERIPHERAL VASCULAR CATHETERIZATION Left 11/25/2015   Procedure: Peripheral Vascular Intervention;  Surgeon: Lorretta Harp, MD;  Location: [place] [facility];  Service: Cardiovascular;  Laterality: Left;  external iliac   PERIPHERAL VASCULAR CATHETERIZATION N/A 01/03/2016   Procedure: Lower Extremity Angiography;  Surgeon: Lorretta Harp, MD;  Location: [place] [facility];  Service: Cardiovascular;  Laterality: N/A;   PERIPHERAL VASCULAR CATHETERIZATION Left 01/03/2016   Procedure: Peripheral Vascular Intervention;  Surgeon: Lorretta Harp, MD;  Location: [place] [facility];  Service: Cardiovascular;  Laterality: Left;  SFA   PERIPHERAL VASCULAR CATHETERIZATION Right 02/14/2016   Procedure: Peripheral Vascular Atherectomy;  Surgeon: Lorretta Harp, MD;  Location: [place] [facility];  Service: Cardiovascular;  Laterality: Right;  SFA   POPLITEAL ARTERY STENT  01/03/2016   Contralateral access with a 7 [place] crossover sheath (  second order catheter placement)   TONSILLECTOMY AND ADENOIDECTOMY     TUMOR EXCISION Right ~ 2005   cancerous tumor removed from shoulder   TUMOR EXCISION Right ~ 2000   benign tumor removed from under shoulder   TUMOR EXCISION Left 06/02/2010   resection of Lt SFA wth interposition of Gore-Tex graft   Social History:  reports that he quit smoking about 50 years ago. His smoking use included pipe. He has never used smokeless tobacco. He reports that he does not drink alcohol and does not use drugs.  Allergies  Allergen Reactions   Peanut-Containing Drug Products Anaphylaxis and Other (See Comments)    Tongue swelling is severe    Ace Inhibitors Cough        Diltiazem Hcl Other (See Comments)    UNSPECIFIED REACTION     Meloxicam Other (See Comments)    GI upset    Eliquis [Apixaban] Other (See Comments)    dizziness    Sertraline Hcl Other (See Comments)   Carvedilol Itching   Clonidine Hcl Other (See Comments)    Patch only - skin irritation   Duloxetine Hcl Other (See Comments)    Urinary frequency    Family History  Problem Relation Age of Onset   Coronary artery disease Mother    Heart attack Mother    Hypertension Mother    Heart disease Mother        Open  Heart surgery   Diabetes Father    Heart disease Father    Hyperlipidemia Father    Heart attack Father    Hypertension Sister    Diabetes Sister    Heart disease Sister     Prior to Admission medications   Medication Sig Start Date End Date Taking? Authorizing Provider  amLODipine (NORVASC) 10 MG tablet Take 10 mg by mouth daily.   Yes [provider]  atorvastatin (LIPITOR) 80 MG tablet Take 1 tablet (80 mg total) by mouth daily at 6 PM. 02/09/18  Yes Eileen Stanford, PA-C  clopidogrel (PLAVIX) 75 MG tablet Take 1 tablet (75 mg total) by mouth daily with breakfast. 06/24/20  Yes Duke, Tami Lin, PA  dorzolamide-timolol (COSOPT) 22.3-6.8 MG/ML ophthalmic solution Place 1 drop into both eyes 2 (two) times daily. 04/30/20  Yes [provider]  glipiZIDE (GLUCOTROL) 5 MG tablet Take 5 mg by mouth daily with breakfast.  01/10/18  Yes [provider]  hydrochlorothiazide (HYDRODIURIL) 25 MG tablet Take 25 mg by mouth every morning.   Yes [provider]  isosorbide mononitrate (IMDUR) 60 MG 24 hr tablet TAKE 1 TABLET EVERY DAY Patient taking differently: Take 60 mg by mouth daily. 10/05/21  Yes Lorretta Harp, MD  melatonin 5 MG TABS Take 5 mg by mouth at bedtime.   Yes [provider]  metFORMIN (GLUCOPHAGE) 1000 MG tablet Take 1 tablet (1,000 mg total) by mouth 2 (two) times daily with a meal. Restart on 3/18. 06/23/20  Yes Duke, Tami Lin, PA  metoprolol tartrate (LOPRESSOR) 100 MG tablet Take 1 tablet (100 mg total) by mouth 2 (two) times daily. 08/07/17  Yes Lendon Colonel, NP   MULTIPLE VITAMIN-FOLIC ACID PO Take 1 tablet by mouth daily.   Yes [provider]  nitroGLYCERIN (NITROSTAT) 0.4 MG SL tablet Place 1 tablet (0.4 mg total) under the tongue every 5 (five) minutes as needed for chest pain. 04/15/17  Yes Theora Gianotti, NP  potassium chloride SA (K-DUR,KLOR-CON) 20 MEQ tablet Take 1  tablet (20 mEq total) by mouth daily. 02/18/16  Yes Regalado, Belkys A, MD  ranolazine (RANEXA) 500 MG 12 hr tablet Take 500 mg by mouth 2 (two) times daily. 01/09/22  Yes [provider]  rivaroxaban (XARELTO) 20 MG TABS tablet TAKE 1 TABLET BY MOUTH DAILY WITH SUPPER 01/10/22  Yes Croitoru, Mihai, MD  tamsulosin (FLOMAX) 0.4 MG CAPS capsule Take 0.4 mg by mouth 2 (two) times daily.  03/09/15  Yes [provider]  hydrALAZINE (APRESOLINE) 25 MG tablet Take 1 tablet (25 mg total) by mouth as needed (for BP >150 sys or 90 dia). Patient not taking: Reported on 01/09/2022 09/23/20   Deberah Pelton, NP  ketorolac (ACULAR) 0.4 % SOLN Place 1 drop into both eyes 4 (four) times daily. Patient not taking: Reported on 12/20/2021 06/01/20   [provider]    Physical Exam: Vitals:   02/06/22 0415 02/06/22 0430 02/06/22 0445 02/06/22 0500  BP: (!) 175/84 (!) 174/73 (!) 173/72   Pulse: 72 60 60   Resp: '17 20 19   '$ Temp:      TempSrc:      SpO2: 98% 96% 97%   Weight:    77.6 kg  Height:    '5\' 7"'$  (1.702 m)   Constitutional: NAD, calm, comfortable Eyes: PERRL, lids and conjunctivae normal ENMT: Mucous membranes are moist. Posterior pharynx clear of any exudate or lesions.Normal dentition.  Neck: normal, supple, no masses, no thyromegaly Respiratory: clear to auscultation bilaterally, no wheezing, no crackles. Normal respiratory effort. No accessory muscle use.  Cardiovascular: Regular rate and rhythm, no murmurs / rubs / gallops. No extremity edema. 2+ pedal pulses. No carotid bruits.  Abdomen: no tenderness, no masses palpated. No  hepatosplenomegaly. Bowel sounds positive.  Musculoskeletal: no clubbing / cyanosis. No joint deformity upper and lower extremities. Good ROM, no contractures. Normal muscle tone.  Skin: no rashes, lesions, ulcers. No induration Neurologic: CN 2-12 grossly intact. Sensation intact, DTR normal. Strength 5/5 in all 4. Question of word finding difficulty on exam, exam is otherwise non-focal. Psychiatric: Normal judgment and insight. Alert and oriented x 3. Normal mood.   Data Reviewed:    Reviewed vascular carotid and LE USs from last month.  Lipid Panel     Component Value Date/Time   CHOL 88 (L) 06/30/2021 1120   TRIG 71 06/30/2021 1120   HDL 35 (L) 06/30/2021 1120   CHOLHDL 2.5 06/30/2021 1120   CHOLHDL 2.6 06/23/2020 0325   VLDL 14 06/23/2020 0325   LDLCALC 38 06/30/2021 1120   LABVLDL 15 06/30/2021 1120   Trops 150 and 176 today.  Does not have chronic trop elevation, was previously normal in 2022 and again in 2020.  CMP, UA, are unremarkable (glucosuria due to SGLT2 but BGL not elevated today).     Latest Ref Rng & Units 02/05/2022    5:25 PM 12/01/2021    3:05 PM 08/24/2021    2:42 PM  CBC  WBC 4.0 - 10.5 K/uL 16.8  16.5  15.2   Hemoglobin 13.0 - 17.0 g/dL 11.1  10.8  10.9   Hematocrit 39.0 - 52.0 % 34.8  33.4  34.4   Platelets 150 - 400 K/uL 176  162  162       Assessment and Plan: * Unstable angina (HCC) UA / NSTEMI with positive trops, CP.  Very extensive h/o CAD including CABG, numerous stents.  Got stenting with last LHC in 2020. ACS pathway Cont DAPT Hold home xarelto, put pt  on heparin gtt instead NPO Cards to consult, they wanted medicine to admit May need another LHC Start SL NTG PRN Cont Imdur and Ranexa Cont home statin See dyslipidemia discussion below Tele monitor  Acute encephalopathy Intermittent symptoms of lightheadedness / AMS over past couple of weeks. CT head neg Seems to have some delay to word finding on today's exam, Stroke / TIAs  possible but even if present, patients heart clearly will not tolerate permissive HTN at the moment.  Therefore will treat BPs in setting of ACS / unstable angina. 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 Spoke with neurology: they just recommended getting repeat CTH in 24h, ill get this ordered for tomorrow.  Dyslipidemia (high LDL; low HDL) Cont statin Looks like this has been quite successful in controlling cholesterol: LDL running in the 30s for the past 2 years. Despite this, recent PAD [place] in Sept showed worsening of PAD apparently.  Though I'm not sure how hes still got worsening atherosclerotic disease with cholesterol this good? Defer to cards if theres any benefit to even more aggressive cholesterol control, ie PCSK9? In a patient whos cholesterol already looks excellent, but despite this seems to be having worsening atherosclerotic vascular disease.  Paroxysmal atrial fibrillation (HCC) Hold Xarelto and use heparin gtt instead. Continue BB  CLL (chronic lymphocytic leukemia) (HCC) Pt with known h/o CLL, presumably the cause of his chronic mild leukocytosis, WBC of 16 today appears baseline and unchanged from priors.  OSA on CPAP Cont CPAP QHS  Essential hypertension Cont home BP meds Add NTG given unstable angina / NSTEMI.  Type 2 diabetes mellitus with complication, without long-term current use of insulin ([place]) Hold home metformin and glipizide Sensitive SSI Q4H for the moment.      Advance Care Planning:   Code Status: Full Code  Consults: Cardiology to consult, curb sided Dr. Lorrin Goodell with neurology  Family Communication: No family in room  Severity of Illness: The appropriate patient status for this patient is INPATIENT. Inpatient status is judged to be reasonable and necessary in order to provide the required intensity of service to ensure the patient's safety. The patient's presenting symptoms, physical exam findings, and initial radiographic and laboratory data in the context  of their chronic comorbidities is felt to place them at high risk for further clinical deterioration. Furthermore, it is not anticipated that the patient will be medically stable for discharge from the hospital within 2 midnights of admission.   * I certify that at the point of admission it is my clinical judgment that the patient will require inpatient hospital care spanning beyond 2 midnights from the point of admission due to high intensity of service, high risk for further deterioration and high frequency of surveillance required.*  Author: Etta Quill., DO 02/06/2022 5:53 AM  For on call review [email or web address].

## 2022-02-06 NOTE — Assessment & Plan Note (Signed)
Cont CPAP QHS 

## 2022-02-06 NOTE — ED Provider Notes (Signed)
[facility] Emergency Department Provider Note MRN:  [id number]  Arrival date & time: 02/06/22     Chief Complaint   Altered Mental Status, Weakness, and Chest Pain   History of Present Illness   Robert Burns is a 81 y.o. year-old male presents to the ED with chief complaint of chest pains throughout the week that worsened yesterday morning prior to coming to the ED.  Patient states that he also felt swimmy headed and a bit dizzy.  Family states he has been a bit confused this week and has had more difficulty walking than normal, but it was ultimately the chest pain that brought him to the ER.  History provided by patient. Additional history provided by daughter.  Review of Systems  Pertinent positive and negative review of systems noted in HPI.    Physical Exam   Vitals:   02/06/22 0430 02/06/22 0445  BP: (!) 174/73 (!) 173/72  Pulse: 60 60  Resp: 20 19  Temp:    SpO2: 96% 97%    CONSTITUTIONAL:  well-appearing, NAD NEURO:  Alert and oriented x 3, CN 3-12 grossly intact EYES:  eyes equal and reactive ENT/NECK:  Supple, no stridor  CARDIO:  normal rate, regular rhythm, normal distal pulses PULM:  No respiratory distress, CTAB GI/GU:  non-distended, non tender MSK/SPINE:  No gross deformities, no edema, moves all extremities  SKIN:  no rash, atraumatic   *Additional and/or pertinent findings included in MDM below  Diagnostic and Interventional Summary    EKG Interpretation  Date/Time:  Sunday February 05 2022 16:50:24 EDT Ventricular Rate:  88 PR Interval:  282 QRS Duration: 120 QT Interval:  372 QTC Calculation: 450 R Axis:   95 Text Interpretation: Atrial-paced rhythm with prolonged AV conduction Rightward axis Anteroseptal infarct , age undetermined ST & T wave abnormality, consider inferior ischemia Abnormal ECG When compared with ECG of 23-Jun-2020 03:53, PREVIOUS ECG IS PRESENT Confirmed by Merrily Pew [phone]) on 02/06/2022  4:20:13 AM       Labs Reviewed  CBC - Abnormal; Notable for the following components:      Result Value   WBC 16.8 (*)    RBC 4.18 (*)    Hemoglobin 11.1 (*)    HCT 34.8 (*)    RDW 16.1 (*)    All other components within normal limits  URINALYSIS, ROUTINE W REFLEX MICROSCOPIC - Abnormal; Notable for the following components:   Glucose, UA >=500 (*)    Protein, ur 30 (*)    Leukocytes,Ua TRACE (*)    All other components within normal limits  CBG MONITORING, ED - Abnormal; Notable for the following components:   Glucose-Capillary 68 (*)    All other components within normal limits  TROPONIN I (HIGH SENSITIVITY) - Abnormal; Notable for the following components:   Troponin I (High Sensitivity) 150 (*)    All other components within normal limits  TROPONIN I (HIGH SENSITIVITY) - Abnormal; Notable for the following components:   Troponin I (High Sensitivity) 176 (*)    All other components within normal limits  BASIC METABOLIC PANEL  LIPOPROTEIN A (LPA)  HEMOGLOBIN A1C  CBG MONITORING, ED  CBG MONITORING, ED    CT HEAD WO CONTRAST  Final Result    DG Chest 2 View  Final Result      Medications  aspirin chewable tablet 324 mg (has no administration in time range)  aspirin EC tablet 81 mg (has no administration in time range)  nitroGLYCERIN (NITROSTAT)  SL tablet 0.4 mg (has no administration in time range)  acetaminophen (TYLENOL) tablet 650 mg (has no administration in time range)  ondansetron (ZOFRAN) injection 4 mg (has no administration in time range)  atorvastatin (LIPITOR) tablet 80 mg (has no administration in time range)  insulin aspart (novoLOG) injection 0-9 Units (has no administration in time range)  clopidogrel (PLAVIX) tablet 75 mg (has no administration in time range)     Procedures  /  Critical Care .Critical Care  Performed by: Montine Circle, PA-C Authorized by: Montine Circle, PA-C   Critical care provider statement:    Critical care time  (minutes):  36   Critical care was necessary to treat or prevent imminent or life-threatening deterioration of the following conditions:  Circulatory failure   Critical care was time spent personally by me on the following activities:  Development of treatment plan with patient or surrogate, discussions with consultants, evaluation of patient's response to treatment, examination of patient, ordering and review of laboratory studies, ordering and review of radiographic studies, ordering and performing treatments and interventions, pulse oximetry, re-evaluation of patient's condition and review of old charts   ED Course and Medical Decision Making  I have reviewed the triage vital signs, the nursing notes, and pertinent available records from the EMR.  Social Determinants Affecting Complexity of Care: Patient has no clinically significant social determinants affecting this chief complaint..   ED Course:    Medical Decision Making Patient here with chest pains that have been worsening over the past week.  Family reports some confusion.  Hx of 16 stents and a CABG.  Denies fevers or chills.  Trop is elevated 150->176.  Amount and/or Complexity of Data Reviewed Labs: ordered.    Details: Trop 150->176 No significant electrolyte derangement  Radiology: independent interpretation performed.    Details: CT shows no ICH  Risk OTC drugs. Decision regarding hospitalization.     Consultants: I discussed the case with Dr. Kalman Shan, cardiology, who recommends hospitalist admission.   Treatment and Plan: Patient's exam and diagnostic results are concerning for ACS.  Feel that patient will need admission to the hospital for further treatment and evaluation.  Patient discussed with attending physician, Dr. Dayna Barker, who agrees with plan.  Final Clinical Impressions(s) / ED Diagnoses     ICD-10-CM   1. Chest pain, unspecified type  R07.9     2. Elevated troponin  R79.89     3. Confusion  R41.0        ED Discharge Orders     None         Discharge Instructions Discussed with and Provided to Patient:   Discharge Instructions   None      Montine Circle, PA-C 02/06/22 0528    Mesner, Corene Cornea, MD 02/06/22 [phone]

## 2022-02-06 NOTE — Progress Notes (Signed)
ANTICOAGULATION CONSULT NOTE - Initial Consult  Pharmacy Consult for heparin Indication: chest pain/ACS and atrial fibrillation  Allergies  Allergen Reactions   Peanut-Containing Drug Products Anaphylaxis and Other (See Comments)    Tongue swelling is severe    Ace Inhibitors Cough        Diltiazem Hcl Other (See Comments)    UNSPECIFIED REACTION     Meloxicam Other (See Comments)    GI upset    Eliquis [Apixaban] Other (See Comments)    dizziness   Sertraline Hcl Other (See Comments)   Carvedilol Itching   Clonidine Hcl Other (See Comments)    Patch only - skin irritation   Duloxetine Hcl Other (See Comments)    Urinary frequency    Patient Measurements: Height: '5\' 7"'$  (170.2 cm) Weight: 77.6 kg (171 lb) IBW/kg (Calculated) : 66.1  Vital Signs: Temp: 97.6 F (36.4 C) (10/30 0304) BP: 173/72 (10/30 0445) Pulse Rate: 60 (10/30 0445)  Labs: Recent Labs    02/05/22 1725 02/05/22 2053  HGB 11.1*  --   HCT 34.8*  --   PLT 176  --   CREATININE 1.05  --   TROPONINIHS 150* 176*    Estimated Creatinine Clearance: 49.8 mL/min (by C-G formula based on SCr of 1.05 mg/dL).   Medical History: Past Medical History:  Diagnosis Date   Arthritis    "all over" (11/25/2015)   CAD (coronary artery disease)    a. s/p CABG  06/2002; b. 02/01/15 PCI: DES to prox SVG to PDA, staged PCI of SVG to Diag in 02/2015; c. 04/2017 Cath/PCI: LM nl, LAD 100ost, 53m 75d, LCX 60ost, OM2 80, RCA 100ost, RPDA 80, LIMA->LAD ok, VG->D1 patent stent, VG->RPDA patent stent, 50p, VG->OM1->OM2 90p (3.0x24 Synergy DES), 100 between OM1->OM2 (med rx).   Cancer ([facility]    Right Shoulder, Left Leg- BCC, SCC, AND MELANOMA   Epithelioid hemangioendothelioma    s/p resection of left SFA/mass with interposition 6 mm GoreTex graft 06/02/10, s/p repeat resection for positive margins 09/22/10   Hyperlipidemia    Hypertension    Leg pain    OSA on CPAP    [facility]    a. 10/2002 L  SFA PTA/BMS; b. 8/17 LE Angio: LEIA 90 (9x40 self exp stent), LSFA short segment prox occlusion (staged PTA/stenting 01/03/2016), patent mid stent, RSFA 950mstaged PTA/DEB 02/14/2016).   Presence of permanent cardiac pacemaker    Medtronic   [place]   a. s/p PPM in 2007 with gen change 04/2015 - Medtronic Adapta ADDRL1, ser # [id number].   Type II diabetes mellitus (HCC)    Type II   Urgency of urination     Assessment: [age]yoale c/o confusion and intermittent CP, troponins elevated >> to begin heparin; pt on Xarelto PTA for Afib, last dose taken 10/28 pm.  Goal of Therapy:  Heparin level 0.3-0.7 units/ml aPTT 66-102 seconds Monitor platelets by anticoagulation protocol: Yes   Plan:  Start heparin infusion at 900 units/hr. Monitor heparin levels, aPTT (while Xarelto affects anti-Xa assay), and CBC.  VeWynona NeatPharmD, BCPS  02/06/2022,6:06 AM

## 2022-02-06 NOTE — Progress Notes (Signed)
Patient went the restroom upon arrival to the unit. He had blood coming from his penis. He states that this in new as of today and when he goes to "pee" there is blood. The patient denies being cathed while in the emergency department. He denies pain or burning with urination. [facility] attending made aware via secure chat.

## 2022-02-06 NOTE — Assessment & Plan Note (Addendum)
Cont statin Looks like this has been quite successful in controlling cholesterol: LDL running in the 30s for the past 2 years. Despite this, recent PAD [place] in Sept showed worsening of PAD apparently.  Though I'm not sure how hes still got worsening atherosclerotic disease with cholesterol this good? Defer to cards if theres any benefit to even more aggressive cholesterol control, ie PCSK9? In a patient whos cholesterol already looks excellent, but despite this seems to be having worsening atherosclerotic vascular disease.

## 2022-02-06 NOTE — Assessment & Plan Note (Addendum)
Hold Xarelto and use heparin gtt instead. Continue BB

## 2022-02-07 ENCOUNTER — Inpatient Hospital Stay (HOSPITAL_COMMUNITY): Payer: Medicare HMO

## 2022-02-07 DIAGNOSIS — C9110 Chronic lymphocytic leukemia of B-cell type not having achieved remission: Secondary | ICD-10-CM

## 2022-02-07 DIAGNOSIS — E785 Hyperlipidemia, unspecified: Secondary | ICD-10-CM

## 2022-02-07 DIAGNOSIS — G9340 Encephalopathy, unspecified: Secondary | ICD-10-CM | POA: Diagnosis not present

## 2022-02-07 DIAGNOSIS — I200 Unstable angina: Secondary | ICD-10-CM

## 2022-02-07 DIAGNOSIS — R079 Chest pain, unspecified: Secondary | ICD-10-CM | POA: Diagnosis not present

## 2022-02-07 DIAGNOSIS — E119 Type 2 diabetes mellitus without complications: Secondary | ICD-10-CM

## 2022-02-07 DIAGNOSIS — I25118 Atherosclerotic heart disease of native coronary artery with other forms of angina pectoris: Secondary | ICD-10-CM

## 2022-02-07 DIAGNOSIS — I10 Essential (primary) hypertension: Secondary | ICD-10-CM

## 2022-02-07 DIAGNOSIS — I25708 Atherosclerosis of coronary artery bypass graft(s), unspecified, with other forms of angina pectoris: Secondary | ICD-10-CM | POA: Diagnosis not present

## 2022-02-07 DIAGNOSIS — G4733 Obstructive sleep apnea (adult) (pediatric): Secondary | ICD-10-CM

## 2022-02-07 LAB — BASIC METABOLIC PANEL
Anion gap: 11 (ref 5–15)
BUN: 23 mg/dL (ref 8–23)
CO2: 24 mmol/L (ref 22–32)
Calcium: 9.2 mg/dL (ref 8.9–10.3)
Chloride: 106 mmol/L (ref 98–111)
Creatinine, Ser: 1.12 mg/dL (ref 0.61–1.24)
GFR, Estimated: >60 mL/min (ref >60)
Glucose, Bld: 132 mg/dL — ABNORMAL HIGH (ref 70–99)
Potassium: 3.9 mmol/L (ref 3.5–5.1)
Sodium: 141 mmol/L (ref 135–145)

## 2022-02-07 LAB — CBC
HCT: 33.7 % — ABNORMAL LOW (ref 39.0–52.0)
Hemoglobin: 10.5 g/dL — ABNORMAL LOW (ref 13.0–17.0)
MCH: 25.5 pg — ABNORMAL LOW (ref 26.0–34.0)
MCHC: 31.2 g/dL (ref 30.0–36.0)
MCV: 81.8 fL (ref 80.0–100.0)
Platelets: 151 10*3/uL (ref 150–400)
RBC: 4.12 MIL/uL — ABNORMAL LOW (ref 4.22–5.81)
RDW: 15.9 % — ABNORMAL HIGH (ref 11.5–15.5)
WBC: 15.1 10*3/uL — ABNORMAL HIGH (ref 4.0–10.5)
nRBC: 0 % (ref 0.0–0.2)

## 2022-02-07 LAB — URINALYSIS, ROUTINE W REFLEX MICROSCOPIC
Bacteria, UA: NONE SEEN
Bilirubin Urine: NEGATIVE
Glucose, UA: NEGATIVE mg/dL
Hgb urine dipstick: NEGATIVE
Ketones, ur: NEGATIVE mg/dL
Leukocytes,Ua: NEGATIVE
Nitrite: NEGATIVE
Protein, ur: 30 mg/dL — AB
Specific Gravity, Urine: 1.011 (ref 1.005–1.030)
pH: 5 (ref 5.0–8.0)

## 2022-02-07 LAB — LIPOPROTEIN A (LPA): Lipoprotein (a): 136.2 nmol/L — ABNORMAL HIGH (ref <75.0)

## 2022-02-07 LAB — GLUCOSE, CAPILLARY
Glucose-Capillary: 133 mg/dL — ABNORMAL HIGH (ref 70–99)
Glucose-Capillary: 127 mg/dL — ABNORMAL HIGH (ref 70–99)
Glucose-Capillary: 135 mg/dL — ABNORMAL HIGH (ref 70–99)

## 2022-02-07 NOTE — Progress Notes (Signed)
Rounding Note    Patient Name: Robert Burns Date of Encounter: 02/07/2022  [place] Cardiologist: Quay Burow, MD   Subjective   No change in chest pain pattern   Inpatient Medications    Scheduled Meds:  amLODipine  10 mg Oral Daily   atorvastatin  80 mg Oral q1800   dorzolamide-timolol  1 drop Both Eyes BID   hydrochlorothiazide  25 mg Oral Daily   insulin aspart  0-9 Units Subcutaneous Q4H   isosorbide mononitrate  60 mg Oral Daily   melatonin  5 mg Oral QHS   metoprolol tartrate  100 mg Oral BID   potassium chloride SA  20 mEq Oral Daily   ranolazine  500 mg Oral BID   tamsulosin  0.4 mg Oral BID   Continuous Infusions:  PRN Meds: acetaminophen, nitroGLYCERIN, ondansetron (ZOFRAN) IV   Vital Signs    Vitals:   02/06/22 2340 02/07/22 0344 02/07/22 0719 02/07/22 0827  BP: (!) 159/79 (!) 173/80 (!) 141/72   Pulse: 72 81 62 64  Resp: '14 17 17 17  '$ Temp: (!) 97.5 F (36.4 C) 98.4 F (36.9 C) 98.4 F (36.9 C)   TempSrc: Oral Oral Oral   SpO2: 98% 93% 98% 96%  Weight:      Height:        Intake/Output Summary (Last 24 hours) at 02/07/2022 0906 Last data filed at 02/06/2022 1455 Gross per 24 hour  Intake --  Output 750 ml  Net -750 ml      02/06/2022    9:15 PM 02/06/2022    5:00 AM 01/09/2022   11:31 AM  Last 3 Weights  Weight (lbs) 171 lb 4.8 oz 171 lb 171 lb 3.2 oz  Weight (kg) 77.7 kg 77.565 kg 77.656 kg      Telemetry    SR - Personally Reviewed  ECG    No new - Personally Reviewed  Physical Exam  Per Dr. Johnsie Cancel GEN: No acute distress.   Neck: No JVD Cardiac: RRR, no murmurs, rubs, or gallops.  Respiratory: Clear to auscultation bilaterally. GI: Soft, nontender, non-distended  MS: No edema; No deformity. Neuro:  Nonfocal  Psych: Normal affect   Labs    High Sensitivity Troponin:   Recent Labs  Lab 02/05/22 1725 02/05/22 2053  TROPONINIHS 150* 176*     Chemistry Recent Labs  Lab 02/05/22 1725  02/07/22 0228  NA 142 141  K 4.0 3.9  CL 104 106  CO2 24 24  GLUCOSE 72 132*  BUN 20 23  CREATININE 1.05 1.12  CALCIUM 9.4 9.2  GFRNONAA >60 >60  ANIONGAP 14 11    Lipids No results for input(s): "CHOL", "TRIG", "HDL", "LABVLDL", "LDLCALC", "CHOLHDL" in the last 168 hours.  Hematology Recent Labs  Lab 02/05/22 1725 02/07/22 0228  WBC 16.8* 15.1*  RBC 4.18* 4.12*  HGB 11.1* 10.5*  HCT 34.8* 33.7*  MCV 83.3 81.8  MCH 26.6 25.5*  MCHC 31.9 31.2  RDW 16.1* 15.9*  PLT 176 151   Thyroid No results for input(s): "TSH", "FREET4" in the last 168 hours.  BNPNo results for input(s): "BNP", "PROBNP" in the last 168 hours.  DDimer No results for input(s): "DDIMER" in the last 168 hours.   Radiology    CT HEAD WO CONTRAST (5MM)  Result Date: 02/07/2022 CLINICAL DATA:  Stroke, follow up EXAM: CT HEAD WITHOUT CONTRAST TECHNIQUE: Contiguous axial images were obtained from the base of the skull through the vertex without intravenous contrast.  RADIATION DOSE REDUCTION: This exam was performed according to the departmental dose-optimization program which includes automated exposure control, adjustment of the mA and/or kV according to patient size and/or use of iterative reconstruction technique. COMPARISON:  CT head February 05, 2022. FINDINGS: Brain: No evidence of acute large vascular territory infarction, hemorrhage, hydrocephalus, extra-axial collection or mass lesion/mass effect. Patchy white matter hypodensities, nonspecific but compatible with microvascular ischemic disease. Vascular: No hyperdense vessel. Skull: No acute fracture. Sinuses/Orbits: Clear sinuses.  No acute orbital findings. Other: No mastoid effusions. IMPRESSION: No evidence of acute intracranial abnormality. Electronically Signed   By: Margaretha Sheffield M.D.   On: 02/07/2022 08:47   CT HEAD WO CONTRAST  Result Date: 02/05/2022 CLINICAL DATA:  Altered mental status. EXAM: CT HEAD WITHOUT CONTRAST TECHNIQUE: Contiguous  axial images were obtained from the base of the skull through the vertex without intravenous contrast. RADIATION DOSE REDUCTION: This exam was performed according to the departmental dose-optimization program which includes automated exposure control, adjustment of the mA and/or kV according to patient size and/or use of iterative reconstruction technique. COMPARISON:  Head CT dated 08/09/2021. FINDINGS: Brain: Moderate age-related atrophy and chronic microvascular ischemic changes. There is no acute intracranial hemorrhage. No mass effect or midline shift. No extra-axial fluid collection. Vascular: No hyperdense vessel or unexpected calcification. Skull: Normal. Negative for fracture or focal lesion. Sinuses/Orbits: No acute finding. Other: None IMPRESSION: 1. No acute intracranial pathology. 2. Moderate age-related atrophy and chronic microvascular ischemic changes. Electronically Signed   By: Anner Crete M.D.   On: 02/05/2022 18:13   DG Chest 2 View  Result Date: 02/05/2022 CLINICAL DATA:  Chest pain and weakness EXAM: CHEST - 2 VIEW COMPARISON:  Chest x-ray 04/28/2021 FINDINGS: Sternotomy wires and mediastinal clips are again seen. Right-sided pacemaker is unchanged in position. The heart size and mediastinal contours are within normal limits. Both lungs are clear. The visualized skeletal structures are unremarkable. IMPRESSION: No active cardiopulmonary disease. Electronically Signed   By: Ronney Asters M.D.   On: 02/05/2022 17:52    Cardiac Studies   Carotids   Summary:  Right Carotid: Velocities in the right ICA are consistent with a 40-59%                stenosis. Non-hemodynamically significant plaque <50% noted  in                the CCA.   Left Carotid: Velocities in the left ICA are consistent with a 1-39%  stenosis.               Non-hemodynamically significant plaque <50% noted in the  CCA. The               ECA appears >50% stenosed.   Vertebrals:  Bilateral vertebral arteries  demonstrate antegrade flow. Subclavians: Normal flow hemodynamics were seen in bilateral subclavian              arteries.    Cardiac cath 06/22/21 Mid LM lesion is 50% stenosed. Ost LAD to Prox LAD lesion is 100% stenosed. LIMA to LAD is patent. Distal LAD stent appears better compared to prior study in 2020. Ost Cx lesion is 40% stenosed. Prox Cx lesion is 30% stenosed. Ost 3rd Mrg to 3rd Mrg lesion is 99% stenosed. SVG to OM is occluded. Mid Cx lesion is 75% stenosed. Ost Ramus lesion is 95% stenosed. Areas of in stent restenosis in the SVG to diag graft: Prox Graft lesion is 70% stenosed, Mid Graft to  Dist Graft lesion is 70% stenosed. Scoring balloon angioplasty was performed using a BALLOON WOLVERINE 3.00X10. Post intervention, there is a 0% residual stenosis in the distal stent. Post intervention, there is a 30% residual stenosis in the proximal stent. SVG to diagonal Mid Graft lesion is 90% stenosed. A drug-eluting stent was successfully placed using a STENT RESOLUTE ONYX 3.0X12. IC verapamil given prior to stent placement. Post intervention, there is a 0% residual stenosis. Ost RCA to Prox RCA lesion is 100% stenosed. Origin to Prox SVG to PDA Graft lesion is 80% stenosed and scoring Balloon angioplasty was performed, with a 3.25 Wolverine cutting balloon. Post intervention, there is a 25% residual stenosis. The left ventricular systolic function is normal. LV end diastolic pressure is normal. The left ventricular ejection fraction is 50-55% by visual estimate. There is no aortic valve stenosis.   Successful PCI to the mid SVG to diagonal graft and PTCA to areas of restenosis in this graft.    PTCA to the proximal SVG to RCA in stent restenosis.  Severe calcium.  If he has recurrent restenosis, consider Shockwave IVL.    Plavix for 6 months.  Restart Xarelto in AM.     Diagnostic Dominance: Right  Intervention     Patient Profile     81 y.o. male a hx of bradycardia,  PPM, CAD, CABG, stents and PAD, HLD, HTN, DM, PAF anticoagulation, OSA. CLL  admitted with chest pain and AMS.   Assessment & Plan    CAD/CABG:  He describes normal pattern of angina taking nitro 3-4 times / month no prolonged episodes Limited targets for revascularization mild elevation in troponin would continue medical Rx Do not think he needs another cath this admission as chest pain was not his main reason for coming to ER PPM normal function in sinus V pacing so ECG not interpretable for ischemia He has a Comptroller generator but old leads NOT MRI compatible  PAF:  ok to resume DOAC no plans for invasive cath in sinus with V pacing back up PVD:  stable ambulatory and still drives as well  Neuro:  per primary service has felt foggy headed and off balance CT head no bleed      For questions or updates, please contact [place] Please consult [email or web address] for contact info under        Signed, Cecilie Kicks, NP  02/07/2022, 9:06 AM

## 2022-02-07 NOTE — Evaluation (Signed)
Physical Therapy Evaluation Patient Details Name: Robert Burns MRN: [id number] DOB: Aug 24, 1940 Today's Date: 02/07/2022  History of Present Illness  81 y.o. male presents to ED with c/o AMS, 10/30 with feeling swimmy headed and dizzy, intermittent confusion this week, word finding difficulty.  Also having intermittent chest pains as well c/w cardiac angina. Admitted for treatment of unstable angina. CT showed no evidence of acute intracranial abnormalityPMH: extensive CAD, s/p CABG, 15-16 prior stents, HLD, HTN, DM2, OSA, PAD, SSS s/p PPM.  Clinical Impression  PTA pt living with wife and daughter in multi-level home with bed and bath on main level. Pt reports independence with ambulation, driving, ADLs and iADLs, although daughter assists with cooking and cleaning. Pt is currently limited in safe mobility by brief bouts of dizziness with positional change, likely due to prior BPPV, as well as impaired balance with ambulation due to leg length discrepancy, and impulsive nature with mobility. Pt is independent with bed mobility, mod I for transfers and min guard for ambulation due to impaired balance. Pt reports he has shoe insert for leg length discrepancy however seldom wears it. Pt has RW however refuses to use it. Encouraged pt to wait until dizziness clears with positional change, regular use of shoe insert and Outpatient PT to work on balance. Plan for discharge this afternoon, however PT will continue to follow if he remains inpatient.      Recommendations for follow up therapy are one component of a multi-disciplinary discharge planning process, led by the attending physician.  Recommendations may be updated based on patient status, additional functional criteria and insurance authorization.  Follow Up Recommendations Outpatient PT      Assistance Recommended at Discharge Intermittent Supervision/Assistance  Patient can return home with the following  Help with stairs or ramp for  entrance;A little help with walking and/or transfers    Equipment Recommendations None recommended by PT     Functional Status Assessment Patient has had a recent decline in their functional status and demonstrates the ability to make significant improvements in function in a reasonable and predictable amount of time.     Precautions / Restrictions Precautions Precautions: Fall Precaution Comments: reports 6 or so falls in the last 6 months Restrictions Weight Bearing Restrictions: No      Mobility  Bed Mobility Overal bed mobility: Independent                  Transfers Overall transfer level: Modified independent                 General transfer comment: use of bed rail to power up to standing    Ambulation/Gait Ambulation/Gait assistance: Min guard Gait Distance (Feet): 420 Feet Assistive device: None Gait Pattern/deviations: Step-through pattern, Staggering right Gait velocity: too fast for conditions Gait velocity interpretation: 1.31 - 2.62 ft/sec, indicative of limited community ambulator   General Gait Details: pt with leg length decrepancy which causes bouts of unsteadiness, to R overall pt is able to self steady however can take 2-3 steps to steady. Pt reports he has insert for his shoe but he does not wear it often, encouraged pt to utilize to improve unsteadiness      Balance Overall balance assessment: History of Falls, Needs assistance Sitting-balance support: Feet supported, No upper extremity supported Sitting balance-Leahy Scale: Normal     Standing balance support: No upper extremity supported, During functional activity Standing balance-Leahy Scale: Good  High level balance activites: Direction changes High Level Balance Comments: has missteps with turning able to self steady             Pertinent Vitals/Pain Pain Assessment Pain Assessment: No/denies pain    Home Living Family/patient expects to be  discharged to:: Private residence Living Arrangements: Spouse/significant other Available Help at Discharge: Family;Available 24 hours/day;Available PRN/intermittently (wife 24 hr/day, daughter lives in the home but works during the day) Type of Home: House Home Access: Stairs to enter   [employer] of Steps: 1   Home Layout: Multi-level;Laundry or work area in basement;Able to live on main level with bedroom/bathroom Home Equipment: Conservation officer, nature (2 wheels);Cane - single point;BSC/3in1;Shower seat      Prior Function Prior Level of Function : Independent/Modified Independent             Mobility Comments: drives, walks community level distances without AD ADLs Comments: indepedent with ADLs, daughter assists with some iADLs cookin and cleaning        Extremity/Trunk Assessment   Upper Extremity Assessment Upper Extremity Assessment: Overall WFL for tasks assessed    Lower Extremity Assessment Lower Extremity Assessment: RLE deficits/detail;LLE deficits/detail RLE Deficits / Details: R LE is 1 1/2" shorter that L LE, which creates periods of unsteadiness with gait. ROM and strength WFL RLE Coordination: decreased fine motor       Communication   Communication: No difficulties  Cognition Arousal/Alertness: Awake/alert Behavior During Therapy: Impulsive (requires cuing to wait for PT before getting up) Overall Cognitive Status: Impaired/Different from baseline Area of Impairment: Safety/judgement, Problem solving                         Safety/Judgement: Decreased awareness of deficits, Decreased awareness of safety   Problem Solving: Requires verbal cues, Requires tactile cues General Comments: decreased awareness of his balance impairments, refuses AD for steadying        General Comments General comments (skin integrity, edema, etc.): VSS on RA        Assessment/Plan    PT Assessment Patient needs continued PT services  PT Problem  List Decreased balance;Decreased safety awareness;Other (comment) (leg length decrepency)       PT Treatment Interventions Gait training;Stair training;Functional mobility training;Therapeutic activities;Therapeutic exercise;Balance training;Cognitive remediation;Patient/family education    PT Goals (Current goals can be found in the Care Plan section)  Acute Rehab PT Goals Patient Stated Goal: get back to going to [facility] PT Goal Formulation: With patient/family Time For Goal Achievement: 02/21/22 Potential to Achieve Goals: Good    Frequency Min 3X/week        AM-PAC PT "6 Clicks" Mobility  Outcome Measure Help needed turning from your back to your side while in a flat bed without using bedrails?: None Help needed moving from lying on your back to sitting on the side of a flat bed without using bedrails?: None Help needed moving to and from a bed to a chair (including a wheelchair)?: None Help needed standing up from a chair using your arms (e.g., wheelchair or bedside chair)?: None Help needed to walk in hospital room?: None Help needed climbing 3-5 steps with a railing? : A Little 6 Click Score: 23    End of Session Equipment Utilized During Treatment: Gait belt Activity Tolerance: Patient tolerated treatment well Patient left: in bed;with call bell/phone within reach;with family/visitor present Nurse Communication: Mobility status PT Visit Diagnosis: Unsteadiness on feet (R26.81);Repeated falls (R29.6);Muscle weakness (generalized) (M62.81);Difficulty in walking, not  elsewhere classified (R26.2)    Time: 1449-1510 PT Time Calculation (min) (ACUTE ONLY): 21 min   Charges:   PT Evaluation $PT Eval Moderate Complexity: 1 Mod          Karlon Schlafer B. Migdalia Dk PT, DPT Acute Rehabilitation Services Please use secure chat or  Call Office [phone]   [place] 02/07/2022, 3:48 PM

## 2022-02-07 NOTE — Discharge Summary (Signed)
Physician Discharge Summary   Patient: Robert Burns MRN: [id number] DOB: 19-May-1940  Admit date:     02/05/2022  Discharge date: 02/07/22  Discharge Physician: Patrecia Pour   PCP: [name], MD   Recommendations at discharge:  Continue routine follow up with PCP and cardiology.  Consider neurology outpatient evaluation for disorientation with negative neuroimaging that has resolved.  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 male with a history of extensive CAD status post CABG with about 15 prior stents, history of PAD, SSS s/p PPM who presented to the ED 10/29 complaining of AMS, lightheadedness/dizziness, intermittent confusion with word finding difficulty as well as intermittent chronic chest pain.  CT head unremarkable, patient unable to have MRI due to incompatible pacemaker.  Cardiology consulted, with no further recommendations/work-up.  Neurology was curb sided by admitting physician, recommended to repeat CT head in 24 hours which has been unchanged. The patient is fully oriented at this time and cleared by physical therapy to return to home on 02/07/2022.   Assessment and Plan: Stable angina, CAD with limited options for revascularization. Normal pacemaker functioning, no further evaluation recommended by cardiology and no changes to medications.     Acute encephalopathy: Intermittent symptoms of lightheadedness / AMS over past couple of weeks. After admission, these symptoms are currently resolved. CT head neg, Cant do MRI due  to PPM. Spoke with neurology: they just recommended getting repeat CTH in 24h which was stable.   Dyslipidemia: - Continue statin. Note Lp (a) is 136.    Paroxysmal atrial fibrillation: Continue home medications   CLL: Pt with known h/o CLL, presumably the cause of his chronic mild leukocytosis, WBC of 16 today appears baseline and unchanged from priors.   OSA on CPAP - CPAP QHS   Essential hypertension Cont home BP meds   Type 2 diabetes mellitus with complication, without long-term current use of insulin - Restart home medications  Dark urine: there was initial concern for blood in the urine, though no RBCs or other significant abnormality noted on urinalysis.   Consultants: Cardiology, Neurology curbside Procedures performed: None  Disposition: Home Diet recommendation:  Cardiac and Carb modified diet DISCHARGE MEDICATION: Allergies as of 02/07/2022       Reactions   Peanut-containing Drug Products Anaphylaxis, Other (See Comments)   Tongue swelling is severe   Ace Inhibitors Cough      Diltiazem Hcl Other (See Comments)   UNSPECIFIED REACTION    Meloxicam Other (See Comments)   GI upset   Eliquis [apixaban] Other (See Comments)   dizziness   Sertraline Hcl Other (See Comments)   Carvedilol Itching   Clonidine Hcl Other (See Comments)   Patch only - skin irritation   Duloxetine Hcl Other (See Comments)   Urinary frequency        Medication List     TAKE these medications    amLODipine 10 MG tablet Commonly known as: NORVASC Take 10 mg by mouth daily.   atorvastatin 80 MG tablet Commonly known as: LIPITOR Take  1 tablet (80 mg total) by mouth daily at 6 PM.   clopidogrel 75 MG tablet Commonly known as: PLAVIX Take 1 tablet (75 mg total) by mouth daily with breakfast.   dorzolamide-timolol 2-0.5 % ophthalmic solution Commonly known as: COSOPT Place 1 drop into both eyes 2 (two) times daily.   glipiZIDE 5 MG tablet Commonly known as:  GLUCOTROL Take 5 mg by mouth daily with breakfast.   hydrALAZINE 25 MG tablet Commonly known as: APRESOLINE Take 1 tablet (25 mg total) by mouth as needed (for BP >150 sys or 90 dia).   hydrochlorothiazide 25 MG tablet Commonly known as: HYDRODIURIL Take 25 mg by mouth every morning.   isosorbide mononitrate 60 MG 24 hr tablet Commonly known as: IMDUR TAKE 1 TABLET EVERY DAY   ketorolac 0.4 % Soln Commonly known as: ACULAR Place 1 drop into both eyes 4 (four) times daily.   melatonin 5 MG Tabs Take 5 mg by mouth at bedtime.   metFORMIN 1000 MG tablet Commonly known as: GLUCOPHAGE Take 1 tablet (1,000 mg total) by mouth 2 (two) times daily with a meal. Restart on 3/18.   metoprolol tartrate 100 MG tablet Commonly known as: LOPRESSOR Take 1 tablet (100 mg total) by mouth 2 (two) times daily.   MULTIPLE VITAMIN-FOLIC ACID PO Take 1 tablet by mouth daily.   nitroGLYCERIN 0.4 MG SL tablet Commonly known as: Nitrostat Place 1 tablet (0.4 mg total) under the tongue every 5 (five) minutes as needed for chest pain.   potassium chloride SA 20 MEQ tablet Commonly known as: KLOR-CON M Take 1 tablet (20 mEq total) by mouth daily.   ranolazine 500 MG 12 hr tablet Commonly known as: RANEXA Take 500 mg by mouth 2 (two) times daily.   tamsulosin 0.4 MG Caps capsule Commonly known as: FLOMAX Take 0.4 mg by mouth 2 (two) times daily.   Xarelto 20 MG Tabs tablet Generic drug: rivaroxaban TAKE 1 TABLET BY MOUTH DAILY WITH SUPPER        Discharge Exam: Filed Weights   02/06/22 0500 02/06/22 2115  Weight: 77.6 kg 77.7 kg  No distress, elderly and pleasant. Fully oriented to name, day, date, time, hospital down to his room number and floor (6 [address] 28) and reason for admission.  RRR, no MR or pitting edema Clear, nonlabored.  No focal deficits.  Condition at discharge: stable  The results of significant diagnostics from this hospitalization (including imaging, microbiology,  ancillary and laboratory) are listed below for reference.   Imaging Studies: CT HEAD WO CONTRAST (5MM)  Result Date: 02/07/2022 CLINICAL DATA:  Stroke, follow up EXAM: CT HEAD WITHOUT CONTRAST TECHNIQUE: Contiguous axial images were obtained from the base of the skull through the vertex without intravenous contrast. RADIATION DOSE REDUCTION: This exam was performed according to the departmental dose-optimization program which includes automated exposure control, adjustment of the mA and/or kV according to patient size and/or use of iterative reconstruction technique. COMPARISON:  CT head February 05, 2022. FINDINGS: Brain: No evidence of acute large vascular territory infarction, hemorrhage, hydrocephalus, extra-axial collection or mass lesion/mass effect. Patchy white matter hypodensities, nonspecific but compatible with microvascular ischemic disease. Vascular: No hyperdense vessel. Skull: No acute fracture. Sinuses/Orbits: Clear sinuses.  No acute orbital findings. Other: No mastoid effusions. IMPRESSION: No evidence of acute intracranial abnormality. Electronically Signed   By: Margaretha Sheffield M.D.   On: 02/07/2022 08:47   CT HEAD WO CONTRAST  Result Date: 02/05/2022 CLINICAL DATA:  Altered mental status. EXAM:  CT HEAD WITHOUT CONTRAST TECHNIQUE: Contiguous axial images were obtained from the base of the skull through the vertex without intravenous contrast. RADIATION DOSE REDUCTION: This exam was performed according to the departmental dose-optimization program which includes automated exposure control, adjustment of the mA and/or kV according to patient size and/or use of iterative reconstruction technique. COMPARISON:  Head CT dated 08/09/2021. FINDINGS: Brain: Moderate age-related atrophy and chronic microvascular ischemic changes. There is no acute intracranial hemorrhage. No mass effect or midline shift. No extra-axial fluid collection. Vascular: No hyperdense vessel or unexpected  calcification. Skull: Normal. Negative for fracture or focal lesion. Sinuses/Orbits: No acute finding. Other: None IMPRESSION: 1. No acute intracranial pathology. 2. Moderate age-related atrophy and chronic microvascular ischemic changes. Electronically Signed   By: Anner Crete M.D.   On: 02/05/2022 18:13   DG Chest 2 View  Result Date: 02/05/2022 CLINICAL DATA:  Chest pain and weakness EXAM: CHEST - 2 VIEW COMPARISON:  Chest x-ray 04/28/2021 FINDINGS: Sternotomy wires and mediastinal clips are again seen. Right-sided pacemaker is unchanged in position. The heart size and mediastinal contours are within normal limits. Both lungs are clear. The visualized skeletal structures are unremarkable. IMPRESSION: No active cardiopulmonary disease. Electronically Signed   By: Ronney Asters M.D.   On: 02/05/2022 17:52    Microbiology: Results for orders placed or performed during the hospital encounter of 06/21/20  SARS CORONAVIRUS 2 (TAT 6-24 HRS) Nasopharyngeal Nasopharyngeal Swab     Status: None   Collection Time: 06/21/20  2:02 AM   Specimen: Nasopharyngeal Swab  Result Value Ref Range Status   SARS Coronavirus 2 NEGATIVE NEGATIVE Final    Comment: (NOTE) SARS-CoV-2 target nucleic acids are NOT DETECTED.  The SARS-CoV-2 RNA is generally detectable in upper and lower respiratory specimens during the acute phase of infection. Negative results do not preclude SARS-CoV-2 infection, do not rule out co-infections with other pathogens, and should not be used as the sole basis for treatment or other patient management decisions. Negative results must be combined with clinical observations, patient history, and epidemiological information. The expected result is Negative.  Fact Sheet for Patients: SugarRoll.be  Fact Sheet for Healthcare Providers: https://www.woods-mathews.com/  This test is not yet approved or cleared by the [place] FDA and  has  been authorized for detection and/or diagnosis of SARS-CoV-2 by FDA under an Emergency Use Authorization (EUA). This EUA will remain  in effect (meaning this test can be used) for the duration of the COVID-19 declaration under Se ction 564(b)(1) of the Act, 21 U.S.C. section 360bbb-3(b)(1), unless the authorization is terminated or revoked sooner.  Performed at [facility], [place] [address], [place], [place] [address]   MRSA PCR Screening     Status: Abnormal   Collection Time: 06/21/20  5:34 PM   Specimen: Nasal Mucosa; Nasopharyngeal  Result Value Ref Range Status   MRSA by PCR POSITIVE (A) NEGATIVE Final    Comment:        The GeneXpert MRSA Assay (FDA approved for NASAL specimens only), is one component of a comprehensive MRSA colonization surveillance program. It is not intended to diagnose MRSA infection nor to guide or monitor treatment for MRSA infections. RESULT CALLED TO, READ BACK BY AND VERIFIED WITHEllene Route RN [id number] 06/21/20 A BROWNING Performed at [facility], [place] [address] [place], [place] [address]     Labs: CBC: Recent Labs  Lab 02/05/22 1725 02/07/22 0228  WBC 16.8* 15.1*  HGB 11.1* 10.5*  HCT  34.8* 33.7*  MCV 83.3 81.8  PLT 176 458   Basic Metabolic Panel: Recent Labs  Lab 02/05/22 1725 02/07/22 0228  NA 142 141  K 4.0 3.9  CL 104 106  CO2 24 24  GLUCOSE 72 132*  BUN 20 23  CREATININE 1.05 1.12  CALCIUM 9.4 9.2   Liver Function Tests: No results for input(s): "AST", "ALT", "ALKPHOS", "BILITOT", "PROT", "ALBUMIN" in the last 168 hours. CBG: Recent Labs  Lab 02/06/22 2022 02/06/22 2334 02/07/22 0523 02/07/22 0718 02/07/22 1135  GLUCAP 223* 141* 133* 127* 135*    Discharge time spent: greater than 30 minutes.  Signed: Patrecia Pour, MD [facility] 02/07/2022

## 2022-02-07 NOTE — Progress Notes (Signed)
Mobility Specialist - Progress Note   02/07/22 1207  Mobility  Activity Refused mobility   Pt currently eating lunch. Will follow up.   Larey Seat

## 2022-02-07 NOTE — Care Management (Signed)
  Transition of Care [facility]) Screening Note   Patient Details  Name: Robert Burns Date of Birth: 25-Mar-1941   [facility] CM/SW Contact:    Bethena Roys, RN Phone Number: 02/07/2022, 3:29 PM    Transition of Care Department [facility]) has reviewed the patient. PTA patient was from home with spouse. Patient is agreeable to outpatient PT-ambulatory referral submitted via Epic. No further needs identified at this time.

## 2022-02-07 NOTE — Progress Notes (Signed)
Mobility Specialist - Progress Note   02/07/22 1357  Mobility  Activity Ambulated with assistance in hallway  Level of Assistance Standby assist, set-up cues, supervision of patient - no hands on  Assistive Device None  Distance Ambulated (ft) 420 ft  Activity Response Tolerated well  Mobility Referral Yes  $Mobility charge 1 Mobility   Pt was received in bed and agreeable to mobility. Pt c/o feeling slightly woozy d/t being in bed. Pt was returned to EOB with all needs met.   Larey Seat

## 2022-02-07 NOTE — Progress Notes (Signed)
Overnight progress note  Notified by RN that patient complained of noticing blood in his urine tonight.  He denied pain or dysuria.  Denied being cathed in the ED.  Chart reviewed.  In brief, patient with history of CAD status post CABG and multiple stents admitted for elevated troponin and was seen by cardiology, signed off with no further recommendations.  Per chart, last coronary intervention was in March 2022.  He is on aspirin and Plavix.  Also had strokelike symptoms with negative head CT and unable to get MRI due to incompatible pacemaker.  Supposed to be getting a repeat head CT in 24 hours.  History of paroxysmal A-fib on Xarelto.  -Hold antiplatelet agents and anticoagulation at this time -Stat UA and CBC ordered -Consult urology in a.m.

## 2022-02-14 ENCOUNTER — Ambulatory Visit (INDEPENDENT_AMBULATORY_CARE_PROVIDER_SITE_OTHER): Payer: Medicare HMO

## 2022-02-14 DIAGNOSIS — Z950 Presence of cardiac pacemaker: Secondary | ICD-10-CM

## 2022-02-14 DIAGNOSIS — I495 Sick sinus syndrome: Secondary | ICD-10-CM

## 2022-02-14 LAB — CUP PACEART REMOTE DEVICE CHECK
Battery Impedance: 710 Ohm
Battery Remaining Longevity: 81 mo
Battery Voltage: 2.79 V
Brady Statistic AP VP Percent: 2 %
Brady Statistic AP VS Percent: 98 %
Brady Statistic AS VP Percent: 0 %
Brady Statistic AS VS Percent: 1 %
Date Time Interrogation Session: 20231106205350
Implantable Lead Connection Status: 753985
Implantable Lead Connection Status: 753985
Implantable Lead Implant Date: 20070706
Implantable Lead Implant Date: 20070706
Implantable Lead Location: 753859
Implantable Lead Location: 753860
Implantable Lead Model: 5076
Implantable Lead Model: 5092
Implantable Pulse Generator Implant Date: 20170113
Lead Channel Impedance Value: 424 Ohm
Lead Channel Impedance Value: 761 Ohm
Lead Channel Pacing Threshold Amplitude: 0.75 V
Lead Channel Pacing Threshold Amplitude: 1.375 V
Lead Channel Pacing Threshold Pulse Width: 0.4 ms
Lead Channel Pacing Threshold Pulse Width: 0.4 ms
Lead Channel Setting Pacing Amplitude: 1.5 V
Lead Channel Setting Pacing Amplitude: 2.75 V
Lead Channel Setting Pacing Pulse Width: 0.4 ms
Lead Channel Setting Sensing Sensitivity: 5.6 mV
Zone Setting Status: 755011
Zone Setting Status: 755011

## 2022-02-16 DIAGNOSIS — C9110 Chronic lymphocytic leukemia of B-cell type not having achieved remission: Secondary | ICD-10-CM | POA: Diagnosis not present

## 2022-02-16 DIAGNOSIS — R310 Gross hematuria: Secondary | ICD-10-CM | POA: Diagnosis not present

## 2022-02-16 DIAGNOSIS — R410 Disorientation, unspecified: Secondary | ICD-10-CM | POA: Diagnosis not present

## 2022-02-16 DIAGNOSIS — I25709 Atherosclerosis of coronary artery bypass graft(s), unspecified, with unspecified angina pectoris: Secondary | ICD-10-CM | POA: Diagnosis not present

## 2022-02-22 ENCOUNTER — Other Ambulatory Visit: Payer: Self-pay

## 2022-02-22 ENCOUNTER — Encounter: Payer: Self-pay | Admitting: Physical Therapy

## 2022-02-22 ENCOUNTER — Ambulatory Visit: Payer: Medicare HMO | Attending: Family Medicine | Admitting: Physical Therapy

## 2022-02-22 DIAGNOSIS — R2689 Other abnormalities of gait and mobility: Secondary | ICD-10-CM | POA: Insufficient documentation

## 2022-02-22 DIAGNOSIS — R079 Chest pain, unspecified: Secondary | ICD-10-CM | POA: Diagnosis not present

## 2022-02-22 DIAGNOSIS — M6281 Muscle weakness (generalized): Secondary | ICD-10-CM | POA: Insufficient documentation

## 2022-02-22 NOTE — Therapy (Signed)
OUTPATIENT PHYSICAL THERAPY NEURO EVALUATION   Patient Name: Robert Burns MRN: [id number] DOB:13-Aug-1940, 81 y.o., male Today's Date: 02/22/2022   PCP: [name] REFERRING PROVIDER: Vance Gather   PT End of Session - 02/22/22 1425     Visit Number 1    Number of Visits 12    Date for PT Re-Evaluation 04/05/22    Authorization Type [employer]    Authorization - Visit Number 1    Progress Note Due on Visit 10             Past Medical History:  Diagnosis Date   Arthritis    "all over" (11/25/2015)   CAD (coronary artery disease)    a. s/p CABG  06/2002; b. 02/01/15 PCI: DES to prox SVG to PDA, staged PCI of SVG to Diag in 02/2015; c. 04/2017 Cath/PCI: LM nl, LAD 100ost, 53m 75d, LCX 60ost, OM2 80, RCA 100ost, RPDA 80, LIMA->LAD ok, VG->D1 patent stent, VG->RPDA patent stent, 50p, VG->OM1->OM2 90p (3.0x24 Synergy DES), 100 between OM1->OM2 (med rx).   Cancer ([facility]    Right Shoulder, Left Leg- BCC, SCC, AND MELANOMA   Epithelioid hemangioendothelioma    s/p resection of left SFA/mass with interposition 6 mm GoreTex graft 06/02/10, s/p repeat resection for positive margins 09/22/10   Hyperlipidemia    Hypertension    Leg pain    OSA on CPAP    PAD (peripheral artery disease) ([place]    a. 10/2002 L SFA PTA/BMS; b. 8/17 LE Angio: LEIA 90 (9x40 self exp stent), LSFA short segment prox occlusion (staged PTA/stenting 01/03/2016), patent mid stent, RSFA 960mstaged PTA/DEB 02/14/2016).   Presence of permanent cardiac pacemaker    Medtronic   SSS (sick sinus syndrome) ([place]   a. s/p PPM in 2007 with gen change 04/2015 - Medtronic Adapta ADDRL1, ser # [id number].   Type II diabetes mellitus (HCC)    Type II   Urgency of urination    Past Surgical History:  Procedure Laterality Date   CARDIAC CATHETERIZATION N/A 02/01/2015   Procedure: Left Heart Cath and Coronary Angiography;  Surgeon: JoLorretta HarpMD;  [facility];  Service: Cardiovascular;   Laterality: N/A;   CARDIAC CATHETERIZATION N/A 02/01/2015   Procedure: Coronary Stent Intervention;  Surgeon: JoLorretta HarpMD;  [facility];  Service: Cardiovascular;  Laterality: N/A;   CARDIAC CATHETERIZATION  06/2002   "just before bypass OR"   CARDIAC CATHETERIZATION N/A 03/01/2015   Procedure: Coronary Stent Intervention;  Surgeon: JoLorretta HarpMD;  [facility];  Service: Cardiovascular;  Laterality: N/A;   CARDIAC CATHETERIZATION  02/06/2018   COLONOSCOPY     CORONARY ANGIOPLASTY     CORONARY ARTERY BYPASS GRAFT  06/2002   x5, LIMA-LAD;VG- Diag; seq VG- ramus & OM branch; VG-PDA   CORONARY STENT INTERVENTION N/A 04/12/2017   Procedure: CORONARY STENT INTERVENTION;  Surgeon: BeLorretta HarpMD;  [facility];  Service: Cardiovascular;  Laterality: N/A;   CORONARY STENT INTERVENTION N/A 02/08/2018   Procedure: CORONARY STENT INTERVENTION;  Surgeon: VaJettie BoozeMD;  [place] [place];  Service: Cardiovascular;  Laterality: N/A;   CORONARY STENT INTERVENTION N/A 11/12/2018   Procedure: CORONARY STENT INTERVENTION;  Surgeon: ArWellington HampshireMD;  [facility];  Service: Cardiovascular;  Laterality: N/A;   CORONARY STENT INTERVENTION N/A 06/22/2020   Procedure: CORONARY STENT INTERVENTION;  Surgeon: VaJettie BoozeMD;  Location: [place] [facility];  Service: Cardiovascular;  Laterality: N/A;   EP IMPLANTABLE DEVICE N/A 04/23/2015   Procedure: PPM Generator Changeout;  Surgeon: Sanda Klein, MD;  Location: [place] [facility];  Service: Cardiovascular;  Laterality: N/A;   FALSE ANEURYSM REPAIR Left 11/29/2018   Procedure: REPAIR FALSE ANEURYSM LEFT RADIAL ARTERY;  Surgeon: [name], MD;  Location: [place];  Service: Vascular;  Laterality: Left;   FEMORAL ARTERY STENT Left ~ 2014   "taken out of my leg; couldn' catorgorize what kind so the put it under all 3"; cataroziepitheloid  hemanioendotheliomau   FOOT FRACTURE SURGERY Left [place] / REPLACE / REMOVE PACEMAKER  10/13/05   right side, medtronic Adapta   KNEE HARDWARE REMOVAL Right 1950's   "3-4 months after the insertion"   KNEE SURGERY Right 1950's   "broke my lower leg; had to put pin in my knee to keep lower leg in place til it healed"   LEFT HEART CATH AND CORS/GRAFTS ANGIOGRAPHY N/A 04/12/2017   Procedure: LEFT HEART CATH AND CORS/GRAFTS ANGIOGRAPHY;  Surgeon: Lorretta Harp, MD;  Location: [place] [facility];  Service: Cardiovascular;  Laterality: N/A;   LEFT HEART CATH AND CORS/GRAFTS ANGIOGRAPHY N/A 02/06/2018   Procedure: LEFT HEART CATH AND CORS/GRAFTS ANGIOGRAPHY;  Surgeon: Troy Sine, MD;  Location: [place] [facility];  Service: Cardiovascular;  Laterality: N/A;   LEFT HEART CATH AND CORS/GRAFTS ANGIOGRAPHY N/A 11/12/2018   Procedure: LEFT HEART CATH AND CORS/GRAFTS ANGIOGRAPHY;  Surgeon: Wellington Hampshire, MD;  Location: [place] [facility];  Service: Cardiovascular;  Laterality: N/A;   LEFT HEART CATH AND CORS/GRAFTS ANGIOGRAPHY N/A 06/22/2020   Procedure: LEFT HEART CATH AND CORS/GRAFTS ANGIOGRAPHY;  Surgeon: Jettie Booze, MD;  Location: [place] [facility];  Service: Cardiovascular;  Laterality: N/A;   PERIPHERAL VASCULAR CATHETERIZATION N/A 11/25/2015   Procedure: Lower Extremity Angiography;  Surgeon: Lorretta Harp, MD;  Location: [place] [facility];  Service: Cardiovascular;  Laterality: N/A;   PERIPHERAL VASCULAR CATHETERIZATION Left 11/25/2015   Procedure: Peripheral Vascular Intervention;  Surgeon: Lorretta Harp, MD;  Location: [place] [facility];  Service: Cardiovascular;  Laterality: Left;  external iliac   PERIPHERAL VASCULAR CATHETERIZATION N/A 01/03/2016   Procedure: Lower Extremity Angiography;  Surgeon: Lorretta Harp, MD;  Location: [place] [facility];  Service: Cardiovascular;  Laterality: N/A;   PERIPHERAL VASCULAR CATHETERIZATION Left 01/03/2016    Procedure: Peripheral Vascular Intervention;  Surgeon: Lorretta Harp, MD;  Location: [place] [facility];  Service: Cardiovascular;  Laterality: Left;  SFA   PERIPHERAL VASCULAR CATHETERIZATION Right 02/14/2016   Procedure: Peripheral Vascular Atherectomy;  Surgeon: Lorretta Harp, MD;  Location: [place] [facility];  Service: Cardiovascular;  Laterality: Right;  SFA   POPLITEAL ARTERY STENT  01/03/2016   Contralateral access with a 7 [place] crossover sheath (second order catheter placement)   TONSILLECTOMY AND ADENOIDECTOMY     TUMOR EXCISION Right ~ 2005   cancerous tumor removed from shoulder   TUMOR EXCISION Right ~ 2000   benign tumor removed from under shoulder   TUMOR EXCISION Left 06/02/2010   resection of Lt SFA wth interposition of Gore-Tex graft   Patient Active Problem List   Diagnosis Date Noted   Acute encephalopathy 02/06/2022   Elevated troponin    CLL (chronic lymphocytic leukemia) ([place]) 09/02/2021   Hematoma of right hip 01/10/2021   Primary osteoarthritis of right  elbow 01/10/2021   Chronic anticoagulation 08/12/2019   Dyslipidemia (high LDL; low HDL) 03/05/2019   Unstable angina ([place]) 11/11/2018   Hypercholesterolemia 02/20/2018   Paroxysmal atrial fibrillation ([place]) 02/20/2018   CAD (coronary artery disease) of bypass graft 02/06/2018   CAD, multiple vessel 02/06/2018   Leukocytosis 02/16/2016   Peripheral arterial disease ([place])    Pacemaker 04/23/2015   Positive cardiac stress test    Chest pain    OSA on CPAP 11/04/2012   SSS (sick sinus syndrome), medtronic adapta    Hyperlipidemia due to type 2 diabetes mellitus ([place])    Superficial femoral artery injury 05/09/2011   SPRAIN&STRAIN OTHER SPECIFIED SITES KNEE&LEG 01/31/2010   Type 2 diabetes mellitus with complication, without long-term current use of insulin ([place]) 11/26/2006   Essential hypertension 11/26/2006    ONSET DATE: 01/2022  REFERRING DIAG: chest pain, balance  THERAPY DIAG:  Balance  disorder  Muscle weakness (generalized)  Rationale for Evaluation and Treatment: Rehabilitation  SUBJECTIVE:                                                                                                                                                                                             SUBJECTIVE STATEMENT: Pt states he has been "getting clumsy" lately. He states he has had more frequent falls and feels like his toes are catching when he is walking. He has a known leg length discrepancy and wears a lift in his shoe. Pt accompanied by: self  PERTINENT HISTORY: prior PT for BPPV and neck, CHF  PAIN:  Are you having pain? No  PRECAUTIONS: None  WEIGHT BEARING RESTRICTIONS: No  FALLS: Has patient fallen in last 6 months? Yes. Number of falls unknown "quite a few times"  LIVING ENVIRONMENT: Lives with: lives with their spouse Lives in: House/apartment Stairs: Pt states he has 1 STE which he is able to perform, has steps down to the basement and a railing  PLOF: Independent  PATIENT GOALS: improve balance  OBJECTIVE:    COGNITION: Overall cognitive status: Within functional limits for tasks assessed     COORDINATION: RAMPs LE slow    LOWER EXTREMITY MMT:    MMT Right Eval Left Eval  Hip flexion    Hip extension    Hip abduction    Hip adduction    Hip internal rotation    Hip external rotation    Knee flexion    Knee extension    Ankle dorsiflexion 4- 4-  Ankle plantarflexion 4- 4-  Ankle inversion    Ankle eversion    (Blank rows = not tested)   GAIT: Gait with  head turns CGA Gait with eyes closed min A Gait with speed changes CGA   FUNCTIONAL TESTS:  5 times sit to stand: 17.58 seconds Timed up and go (TUG): 12.59 seconds Berg Balance Scale: 35/56 BERG BALANCE TEST Sitting to Standing: 4.      Stands without using hands and stabilize independently Standing Unsupported: 4.      Stands safely for 2 minutes Sitting Unsupported: 4.      Sits for 2 minutes independently Standing to Sitting: 3.     Controls descent with hands  Transfers: 4.     Transfers safely with minor use of hands Standing with eyes closed: 4.     Stands safely for 10 seconds  Standing with feet together: 1.     Needs help to attain position but can hold for 15 seconds Reaching forward with outstretched arm: 3.     Reaches forward 5 inches Retrieving object from the floor: 3.     Able to pick up with supervision Turning to look behind: 1.     Needs supervision when turning Turning 360 degrees: 2.     Able to turn slowly, but safely Place alternate foot on stool: 1.     Completes >2 steps with minimal assist Standing with one foot in front: 0.     Loses balance while standing/stepping Standing on one foot: 1.     Holds <3 seconds  Total Score: 35/56    TODAY'S TREATMENT:                                                                                                                              02/22/22 See HEP    PATIENT EDUCATION: Education details: HEP, PT POC and goals Person educated: Patient Education method: Explanation, Demonstration, and Handouts Education comprehension: verbalized understanding and returned demonstration  HOME EXERCISE PROGRAM: Access Code: [email or web address] URL: [email or web address] Date: 02/22/2022 Prepared by: Isabelle Course  Exercises - Standing Tandem Balance with Counter Support  - 1 x daily - 7 x weekly - 1 sets - 10 reps - 10-15 seconds hold - Standing Single Leg Stance with Counter Support  - 1 x daily - 7 x weekly - 1 sets - 10 reps - 10-15 secs hold - Heel Toe Raises with Counter Support  - 1 x daily - 7 x weekly - 2 sets - 10 reps  GOALS: Goals reviewed with patient? Yes  SHORT TERM GOALS: Target date: 03/08/2022  Pt will be independent with initial HEP Baseline: Goal status: INITIAL    LONG TERM GOALS: Target date: 04/05/2022  Pt will be independent with advanced HEP Baseline:  Goal  status: INITIAL  2.  Pt will improve 5x STS to <= 15 seconds to demo improved LE strength Baseline:  Goal status: INITIAL  3.  Pt will improve BERG balance score to >= 45 to demo decreased fall risk Baseline:  Goal status: INITIAL  4.  Pt will improve TUG  to <= 10 seconds to demo improved balance Baseline:  Goal status: INITIAL  5.  Pt will improve bilat ankle strength to 4+/5 to improve balance and gait Baseline:  Goal status: INITIAL   ASSESSMENT:  CLINICAL IMPRESSION: Patient is a 81 y.o. male who was seen today for physical therapy evaluation and treatment for balance deficits. Pt presents with decreased ankle and LE strength, decreased balance, impaired functional mobility and gait and will benefit from skilled PT to address deficits and reduce fall risk.   OBJECTIVE IMPAIRMENTS: decreased balance, decreased coordination, difficulty walking, and decreased strength.   ACTIVITY LIMITATIONS: stairs and locomotion level  PARTICIPATION LIMITATIONS: community activity and yard work  PERSONAL FACTORS: Age are also affecting patient's functional outcome.   REHAB POTENTIAL: Good  CLINICAL DECISION MAKING: Evolving/moderate complexity  EVALUATION COMPLEXITY: Moderate  PLAN:  PT FREQUENCY: 2x/week  PT DURATION: 6 weeks  PLANNED INTERVENTIONS: Therapeutic exercises, Therapeutic activity, Neuromuscular re-education, Balance training, Gait training, Patient/Family education, Self Care, Joint mobilization, Aquatic Therapy, Cryotherapy, Moist heat, Taping, Vasopneumatic device, Manual therapy, and Re-evaluation  PLAN FOR NEXT SESSION: assess and progress HEP for balance and ankle strength   Haniya Fern, PT 02/22/2022, 2:45 PM

## 2022-02-28 DIAGNOSIS — R197 Diarrhea, unspecified: Secondary | ICD-10-CM | POA: Diagnosis not present

## 2022-03-06 ENCOUNTER — Ambulatory Visit: Payer: Medicare HMO | Admitting: Physical Therapy

## 2022-03-09 ENCOUNTER — Ambulatory Visit: Payer: Medicare HMO | Admitting: Physical Therapy

## 2022-03-13 ENCOUNTER — Ambulatory Visit: Payer: Medicare HMO | Attending: Family Medicine | Admitting: Physical Therapy

## 2022-03-13 ENCOUNTER — Encounter: Payer: Self-pay | Admitting: Physical Therapy

## 2022-03-13 DIAGNOSIS — R2689 Other abnormalities of gait and mobility: Secondary | ICD-10-CM | POA: Insufficient documentation

## 2022-03-13 DIAGNOSIS — M6281 Muscle weakness (generalized): Secondary | ICD-10-CM | POA: Diagnosis not present

## 2022-03-13 NOTE — Progress Notes (Signed)
Remote pacemaker transmission.   

## 2022-03-13 NOTE — Therapy (Signed)
OUTPATIENT PHYSICAL THERAPY   Patient Name: Robert Burns MRN: [id number] DOB:[birth year], 81 y.o., male Today's Date: 03/13/2022   PCP: [name] REFERRING PROVIDER: Vance Gather   PT End of Session - 03/13/22 1138     Visit Number 2    Number of Visits 12    Date for PT Re-Evaluation 04/05/22    Authorization - Visit Number 2    Progress Note Due on Visit 10    PT Start Time 1100    PT Stop Time 1139    PT Time Calculation (min) 39 min    Activity Tolerance Patient tolerated treatment well    Behavior During Therapy [facility] for tasks assessed/performed              Past Medical History:  Diagnosis Date   Arthritis    "all over" (11/25/2015)   CAD (coronary artery disease)    a. s/p CABG  06/2002; b. 02/01/15 PCI: DES to prox SVG to PDA, staged PCI of SVG to Diag in 02/2015; c. 04/2017 Cath/PCI: LM nl, LAD 100ost, 52m 75d, LCX 60ost, OM2 80, RCA 100ost, RPDA 80, LIMA->LAD ok, VG->D1 patent stent, VG->RPDA patent stent, 50p, VG->OM1->OM2 90p (3.0x24 Synergy DES), 100 between OM1->OM2 (med rx).   Cancer ([facility]    Right Shoulder, Left Leg- BCC, SCC, AND MELANOMA   Epithelioid hemangioendothelioma    s/p resection of left SFA/mass with interposition 6 mm GoreTex graft 06/02/10, s/p repeat resection for positive margins 09/22/10   Hyperlipidemia    Hypertension    Leg pain    OSA on CPAP    PAD (peripheral artery disease) ([facility]    a. 10/2002 L SFA PTA/BMS; b. 8/17 LE Angio: LEIA 90 (9x40 self exp stent), LSFA short segment prox occlusion (staged PTA/stenting 01/03/2016), patent mid stent, RSFA 913mstaged PTA/DEB 02/14/2016).   Presence of permanent cardiac pacemaker    Medtronic   SSS (sick sinus syndrome) ([facility]   a. s/p PPM in 2007 with gen change 04/2015 - Medtronic Adapta ADDRL1, ser # [id number].   Type II diabetes mellitus (HCC)    Type II   Urgency of urination    Past Surgical History:  Procedure Laterality Date   CARDIAC CATHETERIZATION N/A 02/01/2015    Procedure: Left Heart Cath and Coronary Angiography;  Surgeon: JoLorretta HarpMD;  [facility];  Service: Cardiovascular;  Laterality: N/A;   CARDIAC CATHETERIZATION N/A 02/01/2015   Procedure: Coronary Stent Intervention;  Surgeon: JoLorretta HarpMD;  [facility];  Service: Cardiovascular;  Laterality: N/A;   CARDIAC CATHETERIZATION  06/2002   "just before bypass OR"   CARDIAC CATHETERIZATION N/A 03/01/2015   Procedure: Coronary Stent Intervention;  Surgeon: JoLorretta HarpMD;  [facility];  Service: Cardiovascular;  Laterality: N/A;   CARDIAC CATHETERIZATION  02/06/2018   COLONOSCOPY     CORONARY ANGIOPLASTY     CORONARY ARTERY BYPASS GRAFT  06/2002   x5, LIMA-LAD;VG- Diag; seq VG- ramus & OM branch; VG-PDA   CORONARY STENT INTERVENTION N/A 04/12/2017   Procedure: CORONARY STENT INTERVENTION;  Surgeon: BeLorretta HarpMD;  [facility];  Service: Cardiovascular;  Laterality: N/A;   CORONARY STENT INTERVENTION N/A 02/08/2018   Procedure: CORONARY STENT INTERVENTION;  Surgeon: VaJettie BoozeMD;  [facility];  Service: Cardiovascular;  Laterality: N/A;   CORONARY STENT INTERVENTION N/A 11/12/2018   Procedure: CORONARY STENT INTERVENTION;  Surgeon: ArKathlyn Sacramento  A, MD;  Location: [place] [facility];  Service: Cardiovascular;  Laterality: N/A;   CORONARY STENT INTERVENTION N/A 06/22/2020   Procedure: CORONARY STENT INTERVENTION;  Surgeon: Jettie Booze, MD;  Location: [place] [facility];  Service: Cardiovascular;  Laterality: N/A;   EP IMPLANTABLE DEVICE N/A 04/23/2015   Procedure: PPM Generator Changeout;  Surgeon: Sanda Klein, MD;  Location: [place] [facility];  Service: Cardiovascular;  Laterality: N/A;   FALSE ANEURYSM REPAIR Left 11/29/2018   Procedure: REPAIR FALSE ANEURYSM LEFT RADIAL ARTERY;  Surgeon: [name], MD;  Location: [place];  Service: Vascular;  Laterality: Left;    FEMORAL ARTERY STENT Left ~ 2014   "taken out of my leg; couldn' catorgorize what kind so the put it under all 3"; cataroziepitheloid hemanioendotheliomau   FOOT FRACTURE SURGERY Left [place] / REPLACE / REMOVE PACEMAKER  10/13/05   right side, medtronic Adapta   KNEE HARDWARE REMOVAL Right 1950's   "3-4 months after the insertion"   KNEE SURGERY Right 1950's   "broke my lower leg; had to put pin in my knee to keep lower leg in place til it healed"   LEFT HEART CATH AND CORS/GRAFTS ANGIOGRAPHY N/A 04/12/2017   Procedure: LEFT HEART CATH AND CORS/GRAFTS ANGIOGRAPHY;  Surgeon: Lorretta Harp, MD;  Location: [place] [facility];  Service: Cardiovascular;  Laterality: N/A;   LEFT HEART CATH AND CORS/GRAFTS ANGIOGRAPHY N/A 02/06/2018   Procedure: LEFT HEART CATH AND CORS/GRAFTS ANGIOGRAPHY;  Surgeon: Troy Sine, MD;  Location: [place] [facility];  Service: Cardiovascular;  Laterality: N/A;   LEFT HEART CATH AND CORS/GRAFTS ANGIOGRAPHY N/A 11/12/2018   Procedure: LEFT HEART CATH AND CORS/GRAFTS ANGIOGRAPHY;  Surgeon: Wellington Hampshire, MD;  Location: [place] [facility];  Service: Cardiovascular;  Laterality: N/A;   LEFT HEART CATH AND CORS/GRAFTS ANGIOGRAPHY N/A 06/22/2020   Procedure: LEFT HEART CATH AND CORS/GRAFTS ANGIOGRAPHY;  Surgeon: Jettie Booze, MD;  Location: [place] [facility];  Service: Cardiovascular;  Laterality: N/A;   PERIPHERAL VASCULAR CATHETERIZATION N/A 11/25/2015   Procedure: Lower Extremity Angiography;  Surgeon: Lorretta Harp, MD;  Location: [place] [facility];  Service: Cardiovascular;  Laterality: N/A;   PERIPHERAL VASCULAR CATHETERIZATION Left 11/25/2015   Procedure: Peripheral Vascular Intervention;  Surgeon: Lorretta Harp, MD;  Location: [place] [facility];  Service: Cardiovascular;  Laterality: Left;  external iliac   PERIPHERAL VASCULAR CATHETERIZATION N/A 01/03/2016   Procedure: Lower Extremity Angiography;  Surgeon: Lorretta Harp, MD;  Location: [place] [facility];  Service: Cardiovascular;  Laterality: N/A;   PERIPHERAL VASCULAR CATHETERIZATION Left 01/03/2016   Procedure: Peripheral Vascular Intervention;  Surgeon: Lorretta Harp, MD;  Location: [place] [facility];  Service: Cardiovascular;  Laterality: Left;  SFA   PERIPHERAL VASCULAR CATHETERIZATION Right 02/14/2016   Procedure: Peripheral Vascular Atherectomy;  Surgeon: Lorretta Harp, MD;  Location: [place] [facility];  Service: Cardiovascular;  Laterality: Right;  SFA   POPLITEAL ARTERY STENT  01/03/2016   Contralateral access with a 7 [place] crossover sheath (second order catheter placement)   TONSILLECTOMY AND ADENOIDECTOMY     TUMOR EXCISION Right ~ 2005   cancerous tumor removed from shoulder   TUMOR EXCISION Right ~ 2000   benign tumor removed from under shoulder   TUMOR EXCISION Left 06/02/2010   resection of Lt SFA wth interposition of Gore-Tex graft   Patient Active Problem List  Diagnosis Date Noted   Acute encephalopathy 02/06/2022   Elevated troponin    CLL (chronic lymphocytic leukemia) ([place]) 09/02/2021   Hematoma of right hip 01/10/2021   Primary osteoarthritis of right elbow 01/10/2021   Chronic anticoagulation 08/12/2019   Dyslipidemia (high LDL; low HDL) 03/05/2019   Unstable angina ([place]) 11/11/2018   Hypercholesterolemia 02/20/2018   Paroxysmal atrial fibrillation ([place]) 02/20/2018   CAD (coronary artery disease) of bypass graft 02/06/2018   CAD, multiple vessel 02/06/2018   Leukocytosis 02/16/2016   Peripheral arterial disease ([place])    Pacemaker 04/23/2015   Positive cardiac stress test    Chest pain    OSA on CPAP 11/04/2012   SSS (sick sinus syndrome), medtronic adapta    Hyperlipidemia due to type 2 diabetes mellitus ([place])    Superficial femoral artery injury 05/09/2011   SPRAIN&STRAIN OTHER SPECIFIED SITES KNEE&LEG 01/31/2010   Type 2 diabetes mellitus with complication, without long-term current use of insulin ([place])  11/26/2006   Essential hypertension 11/26/2006    ONSET DATE: 01/2022  REFERRING DIAG: chest pain, balance  THERAPY DIAG:  Balance disorder  Muscle weakness (generalized)  Rationale for Evaluation and Treatment: Rehabilitation  SUBJECTIVE:                                                                                                                                                                                             SUBJECTIVE STATEMENT: Pt states he was sick last week with salmonella. He is feeling better now, just weak.  PERTINENT HISTORY: prior PT for BPPV and neck, CHF  PAIN:  Are you having pain? No  PRECAUTIONS: None  WEIGHT BEARING RESTRICTIONS: No  FALLS: Has patient fallen in last 6 months? Yes. Number of falls unknown "quite a few times"   PATIENT GOALS: improve balance  OBJECTIVE:    LOWER EXTREMITY MMT:    MMT Right Eval Left Eval  Hip flexion    Hip extension    Hip abduction    Hip adduction    Hip internal rotation    Hip external rotation    Knee flexion    Knee extension    Ankle dorsiflexion 4- 4-  Ankle plantarflexion 4- 4-  Ankle inversion    Ankle eversion    (Blank rows = not tested)   GAIT: (eval) Gait with head turns CGA Gait with eyes closed min A Gait with speed changes CGA   FUNCTIONAL TESTS: (eval) 5 times sit to stand: 17.58 seconds Timed up and go (TUG): 12.59 seconds Berg Balance Scale: 35/56 BERG BALANCE TEST Sitting to Standing: 4.      Stands without  using hands and stabilize independently Standing Unsupported: 4.      Stands safely for 2 minutes Sitting Unsupported: 4.     Sits for 2 minutes independently Standing to Sitting: 3.     Controls descent with hands  Transfers: 4.     Transfers safely with minor use of hands Standing with eyes closed: 4.     Stands safely for 10 seconds  Standing with feet together: 1.     Needs help to attain position but can hold for 15 seconds Reaching forward with  outstretched arm: 3.     Reaches forward 5 inches Retrieving object from the floor: 3.     Able to pick up with supervision Turning to look behind: 1.     Needs supervision when turning Turning 360 degrees: 2.     Able to turn slowly, but safely Place alternate foot on stool: 1.     Completes >2 steps with minimal assist Standing with one foot in front: 0.     Loses balance while standing/stepping Standing on one foot: 1.     Holds <3 seconds  Total Score: 35/56    TODAY'S TREATMENT:                                                                                                                              [facility] Adult PT Treatment:                                                DATE: 03/13/22 Therapeutic Exercise: Nustep L5 x 5 min for warm up Heel/toe raises x 20  Neuromuscular re-ed: Rocker board x 1 min each A/P, laterally with intermittent UE support/CGA Standing on foam head turns, head nods 2 x 30 sec each Gait with head turns, head nods, backward gait, gait with eyes closed all with CGA Obstacle course stepping over and around obstacles CGA Anti-rotation red TB with marching 2 x 10 bilat  02/22/22 See HEP    PATIENT EDUCATION: Education details: HEP, PT POC and goals Person educated: Patient Education method: Explanation, Demonstration, and Handouts Education comprehension: verbalized understanding and returned demonstration  HOME EXERCISE PROGRAM: Access Code: [email or web address] URL: [email or web address] Date: 02/22/2022 Prepared by: Isabelle Course  Exercises - Standing Tandem Balance with Counter Support  - 1 x daily - 7 x weekly - 1 sets - 10 reps - 10-15 seconds hold - Standing Single Leg Stance with Counter Support  - 1 x daily - 7 x weekly - 1 sets - 10 reps - 10-15 secs hold - Heel Toe Raises with Counter Support  - 1 x daily - 7 x weekly - 2 sets - 10 reps  GOALS: Goals reviewed with patient? Yes  SHORT TERM GOALS: Target date: 03/08/2022  Pt will  be independent with initial HEP Baseline:  Goal status: INITIAL    LONG TERM GOALS: Target date: 04/05/2022  Pt will be independent with advanced HEP Baseline:  Goal status: INITIAL  2.  Pt will improve 5x STS to <= 15 seconds to demo improved LE strength Baseline:  Goal status: INITIAL  3.  Pt will improve BERG balance score to >= 45 to demo decreased fall risk Baseline:  Goal status: INITIAL  4.  Pt will improve TUG to <= 10 seconds to demo improved balance Baseline:  Goal status: INITIAL  5.  Pt will improve bilat ankle strength to 4+/5 to improve balance and gait Baseline:  Goal status: INITIAL   ASSESSMENT:  CLINICAL IMPRESSION: Pt with good tolerance to dynamic gait and balance activities. Most difficulty with eyes closed walking and tandem gait and balance. Reviewed HEP and pt states he will try to do exercises at home  OBJECTIVE IMPAIRMENTS: decreased balance, decreased coordination, difficulty walking, and decreased strength.    PLAN:  PT FREQUENCY: 2x/week  PT DURATION: 6 weeks  PLANNED INTERVENTIONS: Therapeutic exercises, Therapeutic activity, Neuromuscular re-education, Balance training, Gait training, Patient/Family education, Self Care, Joint mobilization, Aquatic Therapy, Cryotherapy, Moist heat, Taping, Vasopneumatic device, Manual therapy, and Re-evaluation  PLAN FOR NEXT SESSION: assess and progress HEP for balance and ankle strength   Dameian Crisman, PT 03/13/2022, 11:39 AM

## 2022-03-16 ENCOUNTER — Encounter: Payer: Self-pay | Admitting: Physical Therapy

## 2022-03-16 ENCOUNTER — Ambulatory Visit: Payer: Medicare HMO | Admitting: Physical Therapy

## 2022-03-16 DIAGNOSIS — M6281 Muscle weakness (generalized): Secondary | ICD-10-CM

## 2022-03-16 DIAGNOSIS — R2689 Other abnormalities of gait and mobility: Secondary | ICD-10-CM | POA: Diagnosis not present

## 2022-03-16 NOTE — Therapy (Signed)
OUTPATIENT PHYSICAL THERAPY   Patient Name: Robert Burns MRN: [id number] DOB:11-19-1940, 81 y.o., male Today's Date: 03/16/2022   PCP: [name] REFERRING PROVIDER: Vance Gather   PT End of Session - 03/16/22 1144     Visit Number 3    Number of Visits 12    Date for PT Re-Evaluation 04/05/22    Authorization - Visit Number 3    Progress Note Due on Visit 10    PT Start Time 1100    PT Stop Time 1144    PT Time Calculation (min) 44 min    Activity Tolerance Patient tolerated treatment well    Behavior During Therapy [facility] for tasks assessed/performed               Past Medical History:  Diagnosis Date   Arthritis    "all over" (11/25/2015)   CAD (coronary artery disease)    a. s/p CABG  06/2002; b. 02/01/15 PCI: DES to prox SVG to PDA, staged PCI of SVG to Diag in 02/2015; c. 04/2017 Cath/PCI: LM nl, LAD 100ost, 33m 75d, LCX 60ost, OM2 80, RCA 100ost, RPDA 80, LIMA->LAD ok, VG->D1 patent stent, VG->RPDA patent stent, 50p, VG->OM1->OM2 90p (3.0x24 Synergy DES), 100 between OM1->OM2 (med rx).   Cancer ([facility]    Right Shoulder, Left Leg- BCC, SCC, AND MELANOMA   Epithelioid hemangioendothelioma    s/p resection of left SFA/mass with interposition 6 mm GoreTex graft 06/02/10, s/p repeat resection for positive margins 09/22/10   Hyperlipidemia    Hypertension    Leg pain    OSA on CPAP    PAD [place]peripheral artery disease) [place]    a. 10/2002 L SFA PTA/BMS; b. 8/17 LE Angio: LEIA 90 (9x40 self exp stent), LSFA short segment prox occlusion (staged PTA/stenting 01/03/2016), patent mid stent, RSFA 957mstaged PTA/DEB 02/14/2016).   Presence of permanent cardiac pacemaker    Medtronic   SSS (sick [place] syndrome) ([place]   a. s/p PPM in 2007 with gen change 04/2015 - Medtronic Adapta ADDRL1, ser # [id number].   Type II diabetes mellitus (HCC)    Type II   Urgency of urination    Past Surgical History:  Procedure Laterality Date   CARDIAC CATHETERIZATION N/A 02/01/2015    Procedure: Left Heart Cath and Coronary Angiography;  Surgeon: JoLorretta HarpMD;  [facility];  Service: Cardiovascular;  Laterality: N/A;   CARDIAC CATHETERIZATION N/A 02/01/2015   Procedure: Coronary Stent Intervention;  Surgeon: JoLorretta HarpMD;  [facility];  Service: Cardiovascular;  Laterality: N/A;   CARDIAC CATHETERIZATION  06/2002   "just before bypass OR"   CARDIAC CATHETERIZATION N/A 03/01/2015   Procedure: Coronary Stent Intervention;  Surgeon: JoLorretta HarpMD;  [facility];  Service: Cardiovascular;  Laterality: N/A;   CARDIAC CATHETERIZATION  02/06/2018   COLONOSCOPY     CORONARY ANGIOPLASTY     CORONARY ARTERY BYPASS GRAFT  06/2002   x5, LIMA-LAD;VG- Diag; seq VG- ramus & OM branch; VG-PDA   CORONARY STENT INTERVENTION N/A 04/12/2017   Procedure: CORONARY STENT INTERVENTION;  Surgeon: BeLorretta HarpMD;  [facility];  Service: Cardiovascular;  Laterality: N/A;   CORONARY STENT INTERVENTION N/A 02/08/2018   Procedure: CORONARY STENT INTERVENTION;  Surgeon: VaJettie BoozeMD;  [facility];  Service: Cardiovascular;  Laterality: N/A;   CORONARY STENT INTERVENTION N/A 11/12/2018   Procedure: CORONARY STENT INTERVENTION;  Surgeon: ArFletcher Anon  Mertie Clause, MD;  Location: [place] [facility];  Service: Cardiovascular;  Laterality: N/A;   CORONARY STENT INTERVENTION N/A 06/22/2020   Procedure: CORONARY STENT INTERVENTION;  Surgeon: Jettie Booze, MD;  Location: [place] [facility] LAB;  Service: Cardiovascular;  Laterality: N/A;   EP IMPLANTABLE DEVICE N/A 04/23/2015   Procedure: PPM Generator Changeout;  Surgeon: Sanda Klein, MD;  Location: [place] [facility] LAB;  Service: Cardiovascular;  Laterality: N/A;   FALSE ANEURYSM REPAIR Left 11/29/2018   Procedure: REPAIR FALSE ANEURYSM LEFT RADIAL ARTERY;  Surgeon: [name], MD;  Location: [place];  Service: Vascular;  Laterality: Left;    FEMORAL ARTERY STENT Left ~ 2014   "taken out of my leg; couldn' catorgorize what kind so the put it under all 3"; cataroziepitheloid hemanioendotheliomau   FOOT FRACTURE SURGERY Left [place] / REPLACE / REMOVE PACEMAKER  10/13/05   right side, medtronic Adapta   KNEE HARDWARE REMOVAL Right 1950's   "3-4 months after the insertion"   KNEE SURGERY Right 1950's   "broke my lower leg; had to put pin in my knee to keep lower leg in place til it healed"   LEFT HEART CATH AND CORS/GRAFTS ANGIOGRAPHY N/A 04/12/2017   Procedure: LEFT HEART CATH AND CORS/GRAFTS ANGIOGRAPHY;  Surgeon: Lorretta Harp, MD;  Location: [place] [facility] LAB;  Service: Cardiovascular;  Laterality: N/A;   LEFT HEART CATH AND CORS/GRAFTS ANGIOGRAPHY N/A 02/06/2018   Procedure: LEFT HEART CATH AND CORS/GRAFTS ANGIOGRAPHY;  Surgeon: Troy Sine, MD;  Location: [place] [facility] LAB;  Service: Cardiovascular;  Laterality: N/A;   LEFT HEART CATH AND CORS/GRAFTS ANGIOGRAPHY N/A 11/12/2018   Procedure: LEFT HEART CATH AND CORS/GRAFTS ANGIOGRAPHY;  Surgeon: Wellington Hampshire, MD;  Location: [place] [facility];  Service: Cardiovascular;  Laterality: N/A;   LEFT HEART CATH AND CORS/GRAFTS ANGIOGRAPHY N/A 06/22/2020   Procedure: LEFT HEART CATH AND CORS/GRAFTS ANGIOGRAPHY;  Surgeon: Jettie Booze, MD;  Location: [place] [facility] LAB;  Service: Cardiovascular;  Laterality: N/A;   PERIPHERAL VASCULAR CATHETERIZATION N/A 11/25/2015   Procedure: Lower Extremity Angiography;  Surgeon: Lorretta Harp, MD;  Location: [place] [facility];  Service: Cardiovascular;  Laterality: N/A;   PERIPHERAL VASCULAR CATHETERIZATION Left 11/25/2015   Procedure: Peripheral Vascular Intervention;  Surgeon: Lorretta Harp, MD;  Location: [place] [facility] LAB;  Service: Cardiovascular;  Laterality: Left;  external iliac   PERIPHERAL VASCULAR CATHETERIZATION N/A 01/03/2016   Procedure: Lower Extremity Angiography;  Surgeon: Lorretta Harp, MD;  Location: [place] [facility] LAB;  Service: Cardiovascular;  Laterality: N/A;   PERIPHERAL VASCULAR CATHETERIZATION Left 01/03/2016   Procedure: Peripheral Vascular Intervention;  Surgeon: Lorretta Harp, MD;  Location: [place] [facility] LAB;  Service: Cardiovascular;  Laterality: Left;  SFA   PERIPHERAL VASCULAR CATHETERIZATION Right 02/14/2016   Procedure: Peripheral Vascular Atherectomy;  Surgeon: Lorretta Harp, MD;  Location: [place] [facility];  Service: Cardiovascular;  Laterality: Right;  SFA   POPLITEAL ARTERY STENT  01/03/2016   Contralateral access with a 7 [place] crossover sheath (second order catheter placement)   TONSILLECTOMY AND ADENOIDECTOMY     TUMOR EXCISION Right ~ 2005   cancerous tumor removed from shoulder   TUMOR EXCISION Right ~ 2000   benign tumor removed from under shoulder   TUMOR EXCISION Left 06/02/2010   resection of Lt SFA wth interposition of Gore-Tex graft   Patient Active Problem List  Diagnosis Date Noted   Acute encephalopathy 02/06/2022   Elevated troponin    CLL (chronic lymphocytic leukemia) ([place]) 09/02/2021   Hematoma of right hip 01/10/2021   Primary osteoarthritis of right elbow 01/10/2021   Chronic anticoagulation 08/12/2019   Dyslipidemia (high LDL; low HDL) 03/05/2019   Unstable angina ([place]) 11/11/2018   Hypercholesterolemia 02/20/2018   Paroxysmal atrial fibrillation ([place]) 02/20/2018   CAD (coronary artery disease) of bypass graft 02/06/2018   CAD, multiple vessel 02/06/2018   Leukocytosis 02/16/2016   Peripheral arterial disease ([place])    Pacemaker 04/23/2015   Positive cardiac stress test    Chest pain    OSA on CPAP 11/04/2012   SSS (sick sinus syndrome), medtronic adapta    Hyperlipidemia due to type 2 diabetes mellitus ([place])    Superficial femoral artery injury 05/09/2011   SPRAIN&STRAIN OTHER SPECIFIED SITES KNEE&LEG 01/31/2010   Type 2 diabetes mellitus with complication, without long-term current use of insulin ([place])  11/26/2006   Essential hypertension 11/26/2006    ONSET DATE: 01/2022  REFERRING DIAG: chest pain, balance  THERAPY DIAG:  Balance disorder  Muscle weakness (generalized)  Rationale for Evaluation and Treatment: Rehabilitation  SUBJECTIVE:                                                                                                                                                                                             SUBJECTIVE STATEMENT: Pt states he feels "blah" today. No pain, just tired  PERTINENT HISTORY: prior PT for BPPV and neck, CHF  PAIN:  Are you having pain? No  PRECAUTIONS: None  WEIGHT BEARING RESTRICTIONS: No  FALLS: Has patient fallen in last 6 months? Yes. Number of falls unknown "quite a few times"   PATIENT GOALS: improve balance  OBJECTIVE:    LOWER EXTREMITY MMT:    MMT Right Eval Left Eval  Hip flexion    Hip extension    Hip abduction    Hip adduction    Hip internal rotation    Hip external rotation    Knee flexion    Knee extension    Ankle dorsiflexion 4- 4-  Ankle plantarflexion 4- 4-  Ankle inversion    Ankle eversion    (Blank rows = not tested)   GAIT: (eval) Gait with head turns CGA Gait with eyes closed min A Gait with speed changes CGA   FUNCTIONAL TESTS: (eval) 5 times sit to stand: 17.58 seconds Timed up and go (TUG): 12.59 seconds Berg Balance Scale: 35/56 BERG BALANCE TEST Sitting to Standing: 4.      Stands without using hands and stabilize independently Standing  Unsupported: 4.      Stands safely for 2 minutes Sitting Unsupported: 4.     Sits for 2 minutes independently Standing to Sitting: 3.     Controls descent with hands  Transfers: 4.     Transfers safely with minor use of hands Standing with eyes closed: 4.     Stands safely for 10 seconds  Standing with feet together: 1.     Needs help to attain position but can hold for 15 seconds Reaching forward with outstretched arm: 3.     Reaches  forward 5 inches Retrieving object from the floor: 3.     Able to pick up with supervision Turning to look behind: 1.     Needs supervision when turning Turning 360 degrees: 2.     Able to turn slowly, but safely Place alternate foot on stool: 1.     Completes >2 steps with minimal assist Standing with one foot in front: 0.     Loses balance while standing/stepping Standing on one foot: 1.     Holds <3 seconds  Total Score: 35/56    TODAY'S TREATMENT:                                                                                                                              [facility] Adult PT Treatment:                                                DATE: 03/16/22 Therapeutic Exercise: Resisted walking 15# backward x 10 Resisted walking sideways 10# x 5 bilat Heel/toe raises x 20 Neuromuscular re-ed: Walking laps holding 15# KB with CGA for balance x 4 laps Walking with basketball bounce and toss x 3 laps CGA Tandem stance on foam 2 x 30 sec Tandem stance on ground with reaching 2 x 10 EC on foam 2 x 30 sec Standing on foam head turns laterally and vertically 2 x 10   [facility] Adult PT Treatment:                                                DATE: 03/13/22 Therapeutic Exercise: Nustep L5 x 5 min for warm up Heel/toe raises x 20  Neuromuscular re-ed: Rocker board x 1 min each A/P, laterally with intermittent UE support/CGA Standing on foam head turns, head nods 2 x 30 sec each Gait with head turns, head nods, backward gait, gait with eyes closed all with CGA Obstacle course stepping over and around obstacles CGA Anti-rotation red TB with marching 2 x 10 bilat  02/22/22 See HEP    PATIENT EDUCATION: Education details: HEP, PT POC and goals Person educated: Patient Education method:  Explanation, Demonstration, and Handouts Education comprehension: verbalized understanding and returned demonstration  HOME EXERCISE PROGRAM: Access Code: [email or web address] URL:  [email or web address] Date: 02/22/2022 Prepared by: Isabelle Course  Exercises - Standing Tandem Balance with Counter Support  - 1 x daily - 7 x weekly - 1 sets - 10 reps - 10-15 seconds hold - Standing Single Leg Stance with Counter Support  - 1 x daily - 7 x weekly - 1 sets - 10 reps - 10-15 secs hold - Heel Toe Raises with Counter Support  - 1 x daily - 7 x weekly - 2 sets - 10 reps  GOALS: Goals reviewed with patient? Yes  SHORT TERM GOALS: Target date: 03/08/2022  Pt will be independent with initial HEP Baseline: Goal status: MET    LONG TERM GOALS: Target date: 04/05/2022  Pt will be independent with advanced HEP Baseline:  Goal status: INITIAL  2.  Pt will improve 5x STS to <= 15 seconds to demo improved LE strength Baseline:  Goal status: INITIAL  3.  Pt will improve BERG balance score to >= 45 to demo decreased fall risk Baseline:  Goal status: INITIAL  4.  Pt will improve TUG to <= 10 seconds to demo improved balance Baseline:  Goal status: INITIAL  5.  Pt will improve bilat ankle strength to 4+/5 to improve balance and gait Baseline:  Goal status: INITIAL   ASSESSMENT:  CLINICAL IMPRESSION: Pt requires CGA for dynamic balance challenges, able to correct LOB with CGA during dynamic balance on foam  OBJECTIVE IMPAIRMENTS: decreased balance, decreased coordination, difficulty walking, and decreased strength.    PLAN:  PT FREQUENCY: 2x/week  PT DURATION: 6 weeks  PLANNED INTERVENTIONS: Therapeutic exercises, Therapeutic activity, Neuromuscular re-education, Balance training, Gait training, Patient/Family education, Self Care, Joint mobilization, Aquatic Therapy, Cryotherapy, Moist heat, Taping, Vasopneumatic device, Manual therapy, and Re-evaluation  PLAN FOR NEXT SESSION: dynamic gait and balance and ankle strength   Ramiah Helfrich, PT 03/16/2022, 11:45 AM

## 2022-03-20 DIAGNOSIS — E1142 Type 2 diabetes mellitus with diabetic polyneuropathy: Secondary | ICD-10-CM | POA: Diagnosis not present

## 2022-03-20 DIAGNOSIS — R5382 Chronic fatigue, unspecified: Secondary | ICD-10-CM | POA: Diagnosis not present

## 2022-03-20 DIAGNOSIS — C9110 Chronic lymphocytic leukemia of B-cell type not having achieved remission: Secondary | ICD-10-CM | POA: Diagnosis not present

## 2022-03-20 DIAGNOSIS — I25709 Atherosclerosis of coronary artery bypass graft(s), unspecified, with unspecified angina pectoris: Secondary | ICD-10-CM | POA: Diagnosis not present

## 2022-03-22 ENCOUNTER — Encounter: Payer: Self-pay | Admitting: Physical Therapy

## 2022-03-22 ENCOUNTER — Ambulatory Visit: Payer: Medicare HMO | Admitting: Physical Therapy

## 2022-03-22 DIAGNOSIS — M6281 Muscle weakness (generalized): Secondary | ICD-10-CM

## 2022-03-22 DIAGNOSIS — R2689 Other abnormalities of gait and mobility: Secondary | ICD-10-CM | POA: Diagnosis not present

## 2022-03-22 NOTE — Therapy (Signed)
OUTPATIENT PHYSICAL THERAPY   Patient Name: Robert Burns MRN: [id number] DOB:[birth year], 81 y.o., male Today's Date: 03/22/2022   PCP: [name] REFERRING PROVIDER: Vance Gather   PT End of Session - 03/22/22 1400     Visit Number 4    Number of Visits 12    Date for PT Re-Evaluation 04/05/22    Authorization - Visit Number 4    Progress Note Due on Visit 10    PT Start Time 6811    PT Stop Time 1400    PT Time Calculation (min) 42 min    Activity Tolerance Patient tolerated treatment well    Behavior During Therapy [facility] for tasks assessed/performed                Past Medical History:  Diagnosis Date   Arthritis    "all over" (11/25/2015)   CAD (coronary artery disease)    a. s/p CABG  06/2002; b. 02/01/15 PCI: DES to prox SVG to PDA, staged PCI of SVG to Diag in 02/2015; c. 04/2017 Cath/PCI: LM nl, LAD 100ost, 24m 75d, LCX 60ost, OM2 80, RCA 100ost, RPDA 80, LIMA->LAD ok, VG->D1 patent stent, VG->RPDA patent stent, 50p, VG->OM1->OM2 90p (3.0x24 Synergy DES), 100 between OM1->OM2 (med rx).   Cancer ([facility]    Right Shoulder, Left Leg- BCC, SCC, AND MELANOMA   Epithelioid hemangioendothelioma    s/p resection of left SFA/mass with interposition 6 mm GoreTex graft 06/02/10, s/p repeat resection for positive margins 09/22/10   Hyperlipidemia    Hypertension    Leg pain    OSA on CPAP    PAD (peripheral artery disease) ([place]    a. 10/2002 L SFA PTA/BMS; b. 8/17 LE Angio: LEIA 90 (9x40 self exp stent), LSFA short segment prox occlusion (staged PTA/stenting 01/03/2016), patent mid stent, RSFA 963mstaged PTA/DEB 02/14/2016).   Presence of permanent cardiac pacemaker    Medtronic   SSS (sick sinus syndrome) ([facility]   a. s/p PPM in 2007 with gen change 04/2015 - Medtronic Adapta ADDRL1, ser # [id number].   Type II diabetes mellitus (HCC)    Type II   Urgency of urination    Past Surgical History:  Procedure Laterality Date   CARDIAC CATHETERIZATION N/A 02/01/2015    Procedure: Left Heart Cath and Coronary Angiography;  Surgeon: JoLorretta HarpMD;  [place] [place] [place];  Service: Cardiovascular;  Laterality: N/A;   CARDIAC CATHETERIZATION N/A 02/01/2015   Procedure: Coronary Stent Intervention;  Surgeon: JoLorretta HarpMD;  [place] [facility];  Service: Cardiovascular;  Laterality: N/A;   CARDIAC CATHETERIZATION  06/2002   "just before bypass OR"   CARDIAC CATHETERIZATION N/A 03/01/2015   Procedure: Coronary Stent Intervention;  Surgeon: JoLorretta HarpMD;  [place] [facility];  Service: Cardiovascular;  Laterality: N/A;   CARDIAC CATHETERIZATION  02/06/2018   COLONOSCOPY     CORONARY ANGIOPLASTY     CORONARY ARTERY BYPASS GRAFT  06/2002   x5, LIMA-LAD;VG- Diag; seq VG- ramus & OM branch; VG-PDA   CORONARY STENT INTERVENTION N/A 04/12/2017   Procedure: CORONARY STENT INTERVENTION;  Surgeon: BeLorretta HarpMD;  [place];  Service: Cardiovascular;  Laterality: N/A;   CORONARY STENT INTERVENTION N/A 02/08/2018   Procedure: CORONARY STENT INTERVENTION;  Surgeon: VaJettie BoozeMD;  [place] [facility];  Service: Cardiovascular;  Laterality: N/A;   CORONARY STENT INTERVENTION N/A 11/12/2018   Procedure: CORONARY STENT INTERVENTION;  Surgeon:  Wellington Hampshire, MD;  Location: [place] [facility] LAB;  Service: Cardiovascular;  Laterality: N/A;   CORONARY STENT INTERVENTION N/A 06/22/2020   Procedure: CORONARY STENT INTERVENTION;  Surgeon: Jettie Booze, MD;  Location: [place] CV LAB;  Service: Cardiovascular;  Laterality: N/A;   EP IMPLANTABLE DEVICE N/A 04/23/2015   Procedure: PPM Generator Changeout;  Surgeon: Sanda Klein, MD;  Location: [place] CV LAB;  Service: Cardiovascular;  Laterality: N/A;   FALSE ANEURYSM REPAIR Left 11/29/2018   Procedure: REPAIR FALSE ANEURYSM LEFT RADIAL ARTERY;  Surgeon: [name], MD;  Location: [place];  Service: Vascular;  Laterality: Left;    FEMORAL ARTERY STENT Left ~ 2014   "taken out of my leg; couldn' catorgorize what kind so the put it under all 3"; cataroziepitheloid hemanioendotheliomau   FOOT FRACTURE SURGERY Left [place] / REPLACE / REMOVE PACEMAKER  10/13/05   right side, medtronic Adapta   KNEE HARDWARE REMOVAL Right 1950's   "3-4 months after the insertion"   KNEE SURGERY Right 1950's   "broke my lower leg; had to put pin in my knee to keep lower leg in place til it healed"   LEFT HEART CATH AND CORS/GRAFTS ANGIOGRAPHY N/A 04/12/2017   Procedure: LEFT HEART CATH AND CORS/GRAFTS ANGIOGRAPHY;  Surgeon: Lorretta Harp, MD;  Location: [place] [facility] LAB;  Service: Cardiovascular;  Laterality: N/A;   LEFT HEART CATH AND CORS/GRAFTS ANGIOGRAPHY N/A 02/06/2018   Procedure: LEFT HEART CATH AND CORS/GRAFTS ANGIOGRAPHY;  Surgeon: Troy Sine, MD;  Location: [place] [facility] LAB;  Service: Cardiovascular;  Laterality: N/A;   LEFT HEART CATH AND CORS/GRAFTS ANGIOGRAPHY N/A 11/12/2018   Procedure: LEFT HEART CATH AND CORS/GRAFTS ANGIOGRAPHY;  Surgeon: Wellington Hampshire, MD;  Location: [place] [facility] LAB;  Service: Cardiovascular;  Laterality: N/A;   LEFT HEART CATH AND CORS/GRAFTS ANGIOGRAPHY N/A 06/22/2020   Procedure: LEFT HEART CATH AND CORS/GRAFTS ANGIOGRAPHY;  Surgeon: Jettie Booze, MD;  Location: [place] [facility] LAB;  Service: Cardiovascular;  Laterality: N/A;   PERIPHERAL VASCULAR CATHETERIZATION N/A 11/25/2015   Procedure: Lower Extremity Angiography;  Surgeon: Lorretta Harp, MD;  Location: [place] [facility] LAB;  Service: Cardiovascular;  Laterality: N/A;   PERIPHERAL VASCULAR CATHETERIZATION Left 11/25/2015   Procedure: Peripheral Vascular Intervention;  Surgeon: Lorretta Harp, MD;  Location: [place] [facility] LAB;  Service: Cardiovascular;  Laterality: Left;  external iliac   PERIPHERAL VASCULAR CATHETERIZATION N/A 01/03/2016   Procedure: Lower Extremity Angiography;  Surgeon: Lorretta Harp, MD;  Location: [place] [facility] LAB;  Service: Cardiovascular;  Laterality: N/A;   PERIPHERAL VASCULAR CATHETERIZATION Left 01/03/2016   Procedure: Peripheral Vascular Intervention;  Surgeon: Lorretta Harp, MD;  Location: [place] [facility] LAB;  Service: Cardiovascular;  Laterality: Left;  SFA   PERIPHERAL VASCULAR CATHETERIZATION Right 02/14/2016   Procedure: Peripheral Vascular Atherectomy;  Surgeon: Lorretta Harp, MD;  Location: [place] [facility] LAB;  Service: Cardiovascular;  Laterality: Right;  SFA   POPLITEAL ARTERY STENT  01/03/2016   Contralateral access with a 7 [place] crossover sheath (second order catheter placement)   TONSILLECTOMY AND ADENOIDECTOMY     TUMOR EXCISION Right ~ 2005   cancerous tumor removed from shoulder   TUMOR EXCISION Right ~ 2000   benign tumor removed from under shoulder   TUMOR EXCISION Left 06/02/2010   resection of Lt SFA wth interposition of Gore-Tex graft   Patient Active Problem List  Diagnosis Date Noted   Acute encephalopathy 02/06/2022   Elevated troponin    CLL (chronic lymphocytic leukemia) ([place]) 09/02/2021   Hematoma of right hip 01/10/2021   Primary osteoarthritis of right elbow 01/10/2021   Chronic anticoagulation 08/12/2019   Dyslipidemia (high LDL; low HDL) 03/05/2019   Unstable angina ([place]) 11/11/2018   Hypercholesterolemia 02/20/2018   Paroxysmal atrial fibrillation ([place]) 02/20/2018   CAD (coronary artery disease) of bypass graft 02/06/2018   CAD, multiple vessel 02/06/2018   Leukocytosis 02/16/2016   Peripheral arterial disease ([place])    Pacemaker 04/23/2015   Positive cardiac stress test    Chest pain    OSA on CPAP 11/04/2012   SSS (sick sinus syndrome), medtronic adapta    Hyperlipidemia due to type 2 diabetes mellitus ([place])    Superficial femoral artery injury 05/09/2011   SPRAIN&STRAIN OTHER SPECIFIED SITES KNEE&LEG 01/31/2010   Type 2 diabetes mellitus with complication, without long-term current use of insulin ([place])  11/26/2006   Essential hypertension 11/26/2006    ONSET DATE: 01/2022  REFERRING DIAG: chest pain, balance  THERAPY DIAG:  Balance disorder  Muscle weakness (generalized)  Rationale for Evaluation and Treatment: Rehabilitation  SUBJECTIVE:                                                                                                                                                                                             SUBJECTIVE STATEMENT: Pt states he still feels fatigued. He went to [facility] yesterday and had blood drawn but does not know results.  PERTINENT HISTORY: prior PT for BPPV and neck, CHF  PAIN:  Are you having pain? No  PRECAUTIONS: None  WEIGHT BEARING RESTRICTIONS: No  FALLS: Has patient fallen in last 6 months? Yes. Number of falls unknown "quite a few times"   PATIENT GOALS: improve balance  OBJECTIVE:    LOWER EXTREMITY MMT:    MMT Right Eval Left Eval  Hip flexion    Hip extension    Hip abduction    Hip adduction    Hip internal rotation    Hip external rotation    Knee flexion    Knee extension    Ankle dorsiflexion 4- 4-  Ankle plantarflexion 4- 4-  Ankle inversion    Ankle eversion    (Blank rows = not tested)   GAIT: (eval) Gait with head turns CGA Gait with eyes closed min A Gait with speed changes CGA   FUNCTIONAL TESTS: (eval) 5 times sit to stand: 17.58 seconds Timed up and go (TUG): 12.59 seconds Berg Balance Scale: 35/56 BERG BALANCE TEST Sitting to Standing: 4.  Stands without using hands and stabilize independently Standing Unsupported: 4.      Stands safely for 2 minutes Sitting Unsupported: 4.     Sits for 2 minutes independently Standing to Sitting: 3.     Controls descent with hands  Transfers: 4.     Transfers safely with minor use of hands Standing with eyes closed: 4.     Stands safely for 10 seconds  Standing with feet together: 1.     Needs help to attain position but can hold for 15  seconds Reaching forward with outstretched arm: 3.     Reaches forward 5 inches Retrieving object from the floor: 3.     Able to pick up with supervision Turning to look behind: 1.     Needs supervision when turning Turning 360 degrees: 2.     Able to turn slowly, but safely Place alternate foot on stool: 1.     Completes >2 steps with minimal assist Standing with one foot in front: 0.     Loses balance while standing/stepping Standing on one foot: 1.     Holds <3 seconds  Total Score: 35/56  BERG BALANCE TEST Sitting to Standing: 4.      Stands without using hands and stabilize independently Standing Unsupported: 4.      Stands safely for 2 minutes Sitting Unsupported: 4.     Sits for 2 minutes independently Standing to Sitting: 4.     Sits safely with minimal use of hands Transfers: 4.     Transfers safely with minor use of hands Standing with eyes closed: 3.     Stands 10 seconds with supervision Standing with feet together: 4.     Stands for 1 minute safely Reaching forward with outstretched arm: 3.     Reaches forward 5 inches Retrieving object from the floor: 4.      Able to pick up easily and safely Turning to look behind: 2.     Turns sideways only, maintains balance Turning 360 degrees: 4.     Able to turn in </=4 seconds  Place alternate foot on stool: 1.     Completes >2 steps with minimal assist Standing with one foot in front: 1.     Needs help to step, but can hold for 15 seconds Standing on one foot: 1.     Holds <3 seconds  Total Score: 43/56   TUG (12/13) 11.36 seconds 5 times sit to stand (12/13) 15.27 seconds  TODAY'S TREATMENT:                                                                                                                              [facility] Adult PT Treatment:  DATE: 03/22/22 Therapeutic Exercise: Nustep L5 x 5 min for warm up Resisted walking 15# backward x 10 Resisted walking sideways 10# x 5  bilat Neuromuscular re-ed: Retest TUG, 5x STS and Berg - see results above Walking laps holding 15# KB with CGA for balance x 4 laps Walking with basketball bounce and toss x 3 laps  [facility] Adult PT Treatment:                                                DATE: 03/16/22 Therapeutic Exercise: Resisted walking 15# backward x 10 Resisted walking sideways 10# x 5 bilat Heel/toe raises x 20 Neuromuscular re-ed: Walking laps holding 15# KB with CGA for balance x 4 laps Walking with basketball bounce and toss x 3 laps CGA Tandem stance on foam 2 x 30 sec Tandem stance on ground with reaching 2 x 10 EC on foam 2 x 30 sec Standing on foam head turns laterally and vertically 2 x 10    PATIENT EDUCATION: Education details: HEP, PT POC and goals Person educated: Patient Education method: Explanation, Demonstration, and Handouts Education comprehension: verbalized understanding and returned demonstration  HOME EXERCISE PROGRAM: Access Code: [email or web address] URL: [email or web address] Date: 02/22/2022 Prepared by: Isabelle Course  Exercises - Standing Tandem Balance with Counter Support  - 1 x daily - 7 x weekly - 1 sets - 10 reps - 10-15 seconds hold - Standing Single Leg Stance with Counter Support  - 1 x daily - 7 x weekly - 1 sets - 10 reps - 10-15 secs hold - Heel Toe Raises with Counter Support  - 1 x daily - 7 x weekly - 2 sets - 10 reps  GOALS: Goals reviewed with patient? Yes  SHORT TERM GOALS: Target date: 03/08/2022  Pt will be independent with initial HEP Baseline: Goal status: MET    LONG TERM GOALS: Target date: 04/05/2022  Pt will be independent with advanced HEP Baseline:  Goal status: INITIAL  2.  Pt will improve 5x STS to <= 15 seconds to demo improved LE strength Baseline:  Goal status: INITIAL  3.  Pt will improve BERG balance score to >= 45 to demo decreased fall risk Baseline:  Goal status: INITIAL  4.  Pt will improve TUG to <= 10 seconds to  demo improved balance Baseline:  Goal status: INITIAL  5.  Pt will improve bilat ankle strength to 4+/5 to improve balance and gait Baseline:  Goal status: INITIAL   ASSESSMENT:  CLINICAL IMPRESSION: Pt has improved TUG, 5x STS and Berg. He is demonstrating improved balance. He still struggles with decreased endurance and activity tolerance and will benefit from skilled PT to work towards remaining goals  OBJECTIVE IMPAIRMENTS: decreased balance, decreased coordination, difficulty walking, and decreased strength.    PLAN:  PT FREQUENCY: 2x/week  PT DURATION: 6 weeks  PLANNED INTERVENTIONS: Therapeutic exercises, Therapeutic activity, Neuromuscular re-education, Balance training, Gait training, Patient/Family education, Self Care, Joint mobilization, Aquatic Therapy, Cryotherapy, Moist heat, Taping, Vasopneumatic device, Manual therapy, and Re-evaluation  PLAN FOR NEXT SESSION: dynamic gait and balance and ankle strength, d/c   Gar Glance, PT 03/22/2022, 2:01 PM

## 2022-03-28 DIAGNOSIS — H35352 Cystoid macular degeneration, left eye: Secondary | ICD-10-CM | POA: Diagnosis not present

## 2022-03-28 DIAGNOSIS — H35372 Puckering of macula, left eye: Secondary | ICD-10-CM | POA: Diagnosis not present

## 2022-03-28 DIAGNOSIS — E119 Type 2 diabetes mellitus without complications: Secondary | ICD-10-CM | POA: Diagnosis not present

## 2022-03-28 DIAGNOSIS — H401234 Low-tension glaucoma, bilateral, indeterminate stage: Secondary | ICD-10-CM | POA: Diagnosis not present

## 2022-03-28 DIAGNOSIS — H34811 Central retinal vein occlusion, right eye, with macular edema: Secondary | ICD-10-CM | POA: Diagnosis not present

## 2022-03-28 DIAGNOSIS — Z7984 Long term (current) use of oral hypoglycemic drugs: Secondary | ICD-10-CM | POA: Diagnosis not present

## 2022-03-29 ENCOUNTER — Encounter: Payer: Self-pay | Admitting: Physical Therapy

## 2022-03-29 ENCOUNTER — Ambulatory Visit: Payer: Medicare HMO | Admitting: Physical Therapy

## 2022-03-29 DIAGNOSIS — R2689 Other abnormalities of gait and mobility: Secondary | ICD-10-CM

## 2022-03-29 DIAGNOSIS — M6281 Muscle weakness (generalized): Secondary | ICD-10-CM | POA: Diagnosis not present

## 2022-03-29 NOTE — Therapy (Signed)
OUTPATIENT PHYSICAL THERAPY AND DISCHARGE   Patient Name: Robert Burns MRN: [id number] DOB:1940/04/24, 81 y.o., male Today's Date: 03/29/2022   PCP: [name] REFERRING PROVIDER: Vance Gather   PT End of Session - 03/29/22 1436     Visit Number 5    Number of Visits 12    Date for PT Re-Evaluation 04/05/22    Authorization Type [employer]    Authorization - Visit Number 5    Progress Note Due on Visit 10    PT Start Time 6599    PT Stop Time 1441    PT Time Calculation (min) 38 min    Activity Tolerance Patient tolerated treatment well    Behavior During Therapy WFL for tasks assessed/performed                 Past Medical History:  Diagnosis Date   Arthritis    "all over" (11/25/2015)   CAD (coronary artery disease)    a. s/p CABG  06/2002; b. 02/01/15 PCI: DES to prox SVG to PDA, staged PCI of SVG to Diag in 02/2015; c. 04/2017 Cath/PCI: LM nl, LAD 100ost, 64m 75d, LCX 60ost, OM2 80, RCA 100ost, RPDA 80, LIMA->LAD ok, VG->D1 patent stent, VG->RPDA patent stent, 50p, VG->OM1->OM2 90p (3.0x24 Synergy DES), 100 between OM1->OM2 (med rx).   Cancer ([facility]    Right Shoulder, Left Leg- BCC, SCC, AND MELANOMA   Epithelioid hemangioendothelioma    s/p resection of left SFA/mass with interposition 6 mm GoreTex graft 06/02/10, s/p repeat resection for positive margins 09/22/10   Hyperlipidemia    Hypertension    Leg pain    OSA on CPAP    PAD (peripheral artery disease) ([place]    a. 10/2002 L SFA PTA/BMS; b. 8/17 LE Angio: LEIA 90 (9x40 self exp stent), LSFA short segment prox occlusion (staged PTA/stenting 01/03/2016), patent mid stent, RSFA 921mstaged PTA/DEB 02/14/2016).   Presence of permanent cardiac pacemaker    Medtronic   SSS (sick [place] syndrome) ([place]   a. s/p PPM in 2007 with gen change 04/2015 - Medtronic Adapta ADDRL1, ser # [id number].   Type II diabetes mellitus (HCC)    Type II   Urgency of urination    Past Surgical History:  Procedure  Laterality Date   CARDIAC CATHETERIZATION N/A 02/01/2015   Procedure: Left Heart Cath and Coronary Angiography;  Surgeon: JoLorretta HarpMD;  [facility];  Service: Cardiovascular;  Laterality: N/A;   CARDIAC CATHETERIZATION N/A 02/01/2015   Procedure: Coronary Stent Intervention;  Surgeon: JoLorretta HarpMD;  [facility];  Service: Cardiovascular;  Laterality: N/A;   CARDIAC CATHETERIZATION  06/2002   "just before bypass OR"   CARDIAC CATHETERIZATION N/A 03/01/2015   Procedure: Coronary Stent Intervention;  Surgeon: JoLorretta HarpMD;  [facility];  Service: Cardiovascular;  Laterality: N/A;   CARDIAC CATHETERIZATION  02/06/2018   COLONOSCOPY     CORONARY ANGIOPLASTY     CORONARY ARTERY BYPASS GRAFT  06/2002   x5, LIMA-LAD;VG- Diag; seq VG- ramus & OM branch; VG-PDA   CORONARY STENT INTERVENTION N/A 04/12/2017   Procedure: CORONARY STENT INTERVENTION;  Surgeon: BeLorretta HarpMD;  [facility];  Service: Cardiovascular;  Laterality: N/A;   CORONARY STENT INTERVENTION N/A 02/08/2018   Procedure: CORONARY STENT INTERVENTION;  Surgeon: VaJettie BoozeMD;  [facility];  Service: Cardiovascular;  Laterality: N/A;   CORONARY STENT INTERVENTION  N/A 11/12/2018   Procedure: CORONARY STENT INTERVENTION;  Surgeon: Wellington Hampshire, MD;  Location: [place] [facility];  Service: Cardiovascular;  Laterality: N/A;   CORONARY STENT INTERVENTION N/A 06/22/2020   Procedure: CORONARY STENT INTERVENTION;  Surgeon: Jettie Booze, MD;  Location: [place] [facility];  Service: Cardiovascular;  Laterality: N/A;   EP IMPLANTABLE DEVICE N/A 04/23/2015   Procedure: PPM Generator Changeout;  Surgeon: Sanda Klein, MD;  Location: [place] [facility] LAB;  Service: Cardiovascular;  Laterality: N/A;   FALSE ANEURYSM REPAIR Left 11/29/2018   Procedure: REPAIR FALSE ANEURYSM LEFT RADIAL ARTERY;  Surgeon: [name], MD;   Location: [place];  Service: Vascular;  Laterality: Left;   FEMORAL ARTERY STENT Left ~ 2014   "taken out of my leg; couldn' catorgorize what kind so the put it under all 3"; cataroziepitheloid hemanioendotheliomau   FOOT FRACTURE SURGERY Left [place] / REPLACE / REMOVE PACEMAKER  10/13/05   right side, medtronic Adapta   KNEE HARDWARE REMOVAL Right 1950's   "3-4 months after the insertion"   KNEE SURGERY Right 1950's   "broke my lower leg; had to put pin in my knee to keep lower leg in place til it healed"   LEFT HEART CATH AND CORS/GRAFTS ANGIOGRAPHY N/A 04/12/2017   Procedure: LEFT HEART CATH AND CORS/GRAFTS ANGIOGRAPHY;  Surgeon: Lorretta Harp, MD;  Location: [place] [facility];  Service: Cardiovascular;  Laterality: N/A;   LEFT HEART CATH AND CORS/GRAFTS ANGIOGRAPHY N/A 02/06/2018   Procedure: LEFT HEART CATH AND CORS/GRAFTS ANGIOGRAPHY;  Surgeon: Troy Sine, MD;  Location: [place] [facility];  Service: Cardiovascular;  Laterality: N/A;   LEFT HEART CATH AND CORS/GRAFTS ANGIOGRAPHY N/A 11/12/2018   Procedure: LEFT HEART CATH AND CORS/GRAFTS ANGIOGRAPHY;  Surgeon: Wellington Hampshire, MD;  Location: [place] [facility] LAB;  Service: Cardiovascular;  Laterality: N/A;   LEFT HEART CATH AND CORS/GRAFTS ANGIOGRAPHY N/A 06/22/2020   Procedure: LEFT HEART CATH AND CORS/GRAFTS ANGIOGRAPHY;  Surgeon: Jettie Booze, MD;  Location: [place] [facility] LAB;  Service: Cardiovascular;  Laterality: N/A;   PERIPHERAL VASCULAR CATHETERIZATION N/A 11/25/2015   Procedure: Lower Extremity Angiography;  Surgeon: Lorretta Harp, MD;  Location: [place] CV LAB;  Service: Cardiovascular;  Laterality: N/A;   PERIPHERAL VASCULAR CATHETERIZATION Left 11/25/2015   Procedure: Peripheral Vascular Intervention;  Surgeon: Lorretta Harp, MD;  Location: [place] CV LAB;  Service: Cardiovascular;  Laterality: Left;  external iliac   PERIPHERAL VASCULAR CATHETERIZATION N/A 01/03/2016    Procedure: Lower Extremity Angiography;  Surgeon: Lorretta Harp, MD;  Location: [place] CV LAB;  Service: Cardiovascular;  Laterality: N/A;   PERIPHERAL VASCULAR CATHETERIZATION Left 01/03/2016   Procedure: Peripheral Vascular Intervention;  Surgeon: Lorretta Harp, MD;  Location: [place] [facility] LAB;  Service: Cardiovascular;  Laterality: Left;  SFA   PERIPHERAL VASCULAR CATHETERIZATION Right 02/14/2016   Procedure: Peripheral Vascular Atherectomy;  Surgeon: Lorretta Harp, MD;  Location: [place] [facility];  Service: Cardiovascular;  Laterality: Right;  SFA   POPLITEAL ARTERY STENT  01/03/2016   Contralateral access with a 7 [place] crossover sheath (second order catheter placement)   TONSILLECTOMY AND ADENOIDECTOMY     TUMOR EXCISION Right ~ 2005   cancerous tumor removed from shoulder   TUMOR EXCISION Right ~ 2000   benign tumor removed from under shoulder   TUMOR EXCISION Left 06/02/2010   resection of Lt SFA wth  interposition of Gore-Tex graft   Patient Active Problem List   Diagnosis Date Noted   Acute encephalopathy 02/06/2022   Elevated troponin    CLL (chronic lymphocytic leukemia) ([place]) 09/02/2021   Hematoma of right hip 01/10/2021   Primary osteoarthritis of right elbow 01/10/2021   Chronic anticoagulation 08/12/2019   Dyslipidemia (high LDL; low HDL) 03/05/2019   Unstable angina (HCC) 11/11/2018   Hypercholesterolemia 02/20/2018   Paroxysmal atrial fibrillation ([place]) 02/20/2018   CAD (coronary artery disease) of bypass graft 02/06/2018   CAD, multiple vessel 02/06/2018   Leukocytosis 02/16/2016   Peripheral arterial disease ([place])    Pacemaker 04/23/2015   Positive cardiac stress test    Chest pain    OSA on CPAP 11/04/2012   SSS (sick sinus syndrome), medtronic adapta    Hyperlipidemia due to type 2 diabetes mellitus ([place])    Superficial femoral artery injury 05/09/2011   SPRAIN&STRAIN OTHER SPECIFIED SITES KNEE&LEG 01/31/2010   Type 2 diabetes mellitus with  complication, without long-term current use of insulin ([place]) 11/26/2006   Essential hypertension 11/26/2006    ONSET DATE: 01/2022  REFERRING DIAG: chest pain, balance  THERAPY DIAG:  Balance disorder  Muscle weakness (generalized)  Rationale for Evaluation and Treatment: Rehabilitation  SUBJECTIVE:                                                                                                                                                                                             SUBJECTIVE STATEMENT: Pt states he has to start taking medicine for his salmonella again. He feels fatigued.  PERTINENT HISTORY: prior PT for BPPV and neck, CHF  PAIN:  Are you having pain? No  PRECAUTIONS: None  WEIGHT BEARING RESTRICTIONS: No  FALLS: Has patient fallen in last 6 months? Yes. Number of falls unknown "quite a few times"   PATIENT GOALS: improve balance  OBJECTIVE:    LOWER EXTREMITY MMT:    MMT Right Eval Left Eval  Hip flexion    Hip extension    Hip abduction    Hip adduction    Hip internal rotation    Hip external rotation    Knee flexion    Knee extension    Ankle dorsiflexion 4- 4-  Ankle plantarflexion 4- 4-  Ankle inversion    Ankle eversion    (Blank rows = not tested)   GAIT: (eval) Gait with head turns CGA Gait with eyes closed min A Gait with speed changes CGA   FUNCTIONAL TESTS: (eval) 5 times sit to stand: 17.58 seconds Timed up and go (TUG): 12.59 seconds Berg Balance Scale: 35/56 BERG BALANCE TEST  Sitting to Standing: 4.      Stands without using hands and stabilize independently Standing Unsupported: 4.      Stands safely for 2 minutes Sitting Unsupported: 4.     Sits for 2 minutes independently Standing to Sitting: 3.     Controls descent with hands  Transfers: 4.     Transfers safely with minor use of hands Standing with eyes closed: 4.     Stands safely for 10 seconds  Standing with feet together: 1.     Needs help to attain  position but can hold for 15 seconds Reaching forward with outstretched arm: 3.     Reaches forward 5 inches Retrieving object from the floor: 3.     Able to pick up with supervision Turning to look behind: 1.     Needs supervision when turning Turning 360 degrees: 2.     Able to turn slowly, but safely Place alternate foot on stool: 1.     Completes >2 steps with minimal assist Standing with one foot in front: 0.     Loses balance while standing/stepping Standing on one foot: 1.     Holds <3 seconds  Total Score: 35/56  BERG BALANCE TEST Sitting to Standing: 4.      Stands without using hands and stabilize independently Standing Unsupported: 4.      Stands safely for 2 minutes Sitting Unsupported: 4.     Sits for 2 minutes independently Standing to Sitting: 4.     Sits safely with minimal use of hands Transfers: 4.     Transfers safely with minor use of hands Standing with eyes closed: 3.     Stands 10 seconds with supervision Standing with feet together: 4.     Stands for 1 minute safely Reaching forward with outstretched arm: 3.     Reaches forward 5 inches Retrieving object from the floor: 4.      Able to pick up easily and safely Turning to look behind: 2.     Turns sideways only, maintains balance Turning 360 degrees: 4.     Able to turn in </=4 seconds  Place alternate foot on stool: 1.     Completes >2 steps with minimal assist Standing with one foot in front: 1.     Needs help to step, but can hold for 15 seconds Standing on one foot: 1.     Holds <3 seconds  Total Score: 43/56   TUG (12/13) 11.36 seconds 5 times sit to stand (12/13) 15.27 seconds  TODAY'S TREATMENT:                                                                                                                              [facility] Adult PT Treatment:  DATE: 03/29/22 Therapeutic Exercise: Nustep L6 x 6 min for conditioning Neuromuscular re-ed: LAQ 2# x 10  bilat Hip abd 2# x 10 bilat HS curl 2# x 10 bilat Toe raises x 10 bilat Heel raises x 10 bilat Mini squat without support x 10 Figure 8 walk x 3 Side step at counter 10' x 8 Tandem walking at counter 10' x 8 SLS x 10 sec bilat Heel walking 10' x 4 Toe walking 10' x 4 Sit <> stand without UEs x 10  [facility] Adult PT Treatment:                                                DATE: 03/22/22 Therapeutic Exercise: Nustep L5 x 5 min for warm up Resisted walking 15# backward x 10 Resisted walking sideways 10# x 5 bilat Neuromuscular re-ed: Retest TUG, 5x STS and Berg - see results above Walking laps holding 15# KB with CGA for balance x 4 laps Walking with basketball bounce and toss x 3 laps   PATIENT EDUCATION: Education details: HEP, PT POC and goals Person educated: Patient Education method: Explanation, Demonstration, and Handouts Education comprehension: verbalized understanding and returned demonstration  HOME EXERCISE PROGRAM: Pt given handout of [place] strengthening and balance exercises  GOALS: Goals reviewed with patient? Yes  SHORT TERM GOALS: Target date: 03/08/2022  Pt will be independent with initial HEP Baseline: Goal status: MET    LONG TERM GOALS: Target date: 04/05/2022  Pt will be independent with advanced HEP Baseline:  Goal status: MET  2.  Pt will improve 5x STS to <= 15 seconds to demo improved LE strength Baseline:  Goal status: NOT MET  3.  Pt will improve BERG balance score to >= 45 to demo decreased fall risk Baseline:  Goal status: NOT MET  4.  Pt will improve TUG to <= 10 seconds to demo improved balance Baseline:  Goal status: NOT MET  5.  Pt will improve bilat ankle strength to 4+/5 to improve balance and gait Baseline:  Goal status: MET   ASSESSMENT:  CLINICAL IMPRESSION: Pt has improved strength and balance. Continues with decreased activity tolerance. Provided pt with extensive HEP for strength and balance training and pt  able to verbalize and demo understanding. Pt feels comfortable with d/c to HEP  OBJECTIVE IMPAIRMENTS: decreased balance, decreased coordination, difficulty walking, and decreased strength.    PLAN:  PT FREQUENCY: 2x/week  PT DURATION: 6 weeks  PLANNED INTERVENTIONS: Therapeutic exercises, Therapeutic activity, Neuromuscular re-education, Balance training, Gait training, Patient/Family education, Self Care, Joint mobilization, Aquatic Therapy, Cryotherapy, Moist heat, Taping, Vasopneumatic device, Manual therapy, and Re-evaluation  PLAN FOR NEXT SESSION: d/c  PHYSICAL THERAPY DISCHARGE SUMMARY  Visits from Start of Care: 5  Current functional level related to goals / functional outcomes: Improved balance and strength   Remaining deficits: See above   Education / Equipment: HEP   Patient agrees to discharge. Patient goals were partially met. Patient is being discharged due to being pleased with the current functional level.  Yaret Hush, PT 03/29/2022, 2:37 PM

## 2022-05-02 DIAGNOSIS — H35372 Puckering of macula, left eye: Secondary | ICD-10-CM | POA: Diagnosis not present

## 2022-05-02 DIAGNOSIS — H35352 Cystoid macular degeneration, left eye: Secondary | ICD-10-CM | POA: Diagnosis not present

## 2022-05-02 DIAGNOSIS — H34811 Central retinal vein occlusion, right eye, with macular edema: Secondary | ICD-10-CM | POA: Diagnosis not present

## 2022-05-12 DIAGNOSIS — C9110 Chronic lymphocytic leukemia of B-cell type not having achieved remission: Secondary | ICD-10-CM | POA: Diagnosis not present

## 2022-05-12 DIAGNOSIS — I25709 Atherosclerosis of coronary artery bypass graft(s), unspecified, with unspecified angina pectoris: Secondary | ICD-10-CM | POA: Diagnosis not present

## 2022-05-12 DIAGNOSIS — E1142 Type 2 diabetes mellitus with diabetic polyneuropathy: Secondary | ICD-10-CM | POA: Diagnosis not present

## 2022-05-12 DIAGNOSIS — A09 Infectious gastroenteritis and colitis, unspecified: Secondary | ICD-10-CM | POA: Diagnosis not present

## 2022-05-12 DIAGNOSIS — R053 Chronic cough: Secondary | ICD-10-CM | POA: Diagnosis not present

## 2022-05-12 DIAGNOSIS — I10 Essential (primary) hypertension: Secondary | ICD-10-CM | POA: Diagnosis not present

## 2022-05-16 ENCOUNTER — Ambulatory Visit: Payer: Medicare HMO

## 2022-05-16 DIAGNOSIS — I495 Sick sinus syndrome: Secondary | ICD-10-CM

## 2022-05-17 ENCOUNTER — Other Ambulatory Visit: Payer: Self-pay | Admitting: Hematology and Oncology

## 2022-05-17 ENCOUNTER — Inpatient Hospital Stay (HOSPITAL_BASED_OUTPATIENT_CLINIC_OR_DEPARTMENT_OTHER): Payer: Medicare HMO | Admitting: Hematology and Oncology

## 2022-05-17 ENCOUNTER — Inpatient Hospital Stay: Payer: Medicare HMO | Attending: Hematology and Oncology

## 2022-05-17 ENCOUNTER — Other Ambulatory Visit: Payer: Self-pay

## 2022-05-17 VITALS — BP 133/83 | HR 100 | Temp 97.5°F | Resp 17 | Wt 172.5 lb

## 2022-05-17 DIAGNOSIS — Z79899 Other long term (current) drug therapy: Secondary | ICD-10-CM | POA: Diagnosis not present

## 2022-05-17 DIAGNOSIS — C9110 Chronic lymphocytic leukemia of B-cell type not having achieved remission: Secondary | ICD-10-CM | POA: Insufficient documentation

## 2022-05-17 DIAGNOSIS — R5383 Other fatigue: Secondary | ICD-10-CM | POA: Diagnosis not present

## 2022-05-17 DIAGNOSIS — D72820 Lymphocytosis (symptomatic): Secondary | ICD-10-CM

## 2022-05-17 LAB — CUP PACEART REMOTE DEVICE CHECK
Battery Impedance: 760 Ohm
Battery Remaining Longevity: 77 mo
Battery Voltage: 2.79 V
Brady Statistic AP VP Percent: 3 %
Brady Statistic AP VS Percent: 97 %
Brady Statistic AS VP Percent: 0 %
Brady Statistic AS VS Percent: 0 %
Date Time Interrogation Session: 20240207101924
Implantable Lead Connection Status: 753985
Implantable Lead Connection Status: 753985
Implantable Lead Implant Date: 20070706
Implantable Lead Implant Date: 20070706
Implantable Lead Location: 753859
Implantable Lead Location: 753860
Implantable Lead Model: 5076
Implantable Lead Model: 5092
Implantable Pulse Generator Implant Date: 20170113
Lead Channel Impedance Value: 402 Ohm
Lead Channel Impedance Value: 761 Ohm
Lead Channel Pacing Threshold Amplitude: 0.75 V
Lead Channel Pacing Threshold Amplitude: 1.375 V
Lead Channel Pacing Threshold Pulse Width: 0.4 ms
Lead Channel Pacing Threshold Pulse Width: 0.4 ms
Lead Channel Setting Pacing Amplitude: 1.5 V
Lead Channel Setting Pacing Amplitude: 2.75 V
Lead Channel Setting Pacing Pulse Width: 0.4 ms
Lead Channel Setting Sensing Sensitivity: 5.6 mV
Zone Setting Status: 755011
Zone Setting Status: 755011

## 2022-05-17 LAB — CBC WITH DIFFERENTIAL (CANCER CENTER ONLY)
Abs Immature Granulocytes: 0.05 10*3/uL (ref 0.00–0.07)
Basophils Absolute: 0.1 10*3/uL (ref 0.0–0.1)
Basophils Relative: 0 %
Eosinophils Absolute: 0.1 10*3/uL (ref 0.0–0.5)
Eosinophils Relative: 1 %
HCT: 35.1 % — ABNORMAL LOW (ref 39.0–52.0)
Hemoglobin: 11.1 g/dL — ABNORMAL LOW (ref 13.0–17.0)
Immature Granulocytes: 0 %
Lymphocytes Relative: 67 %
Lymphs Abs: 13.3 10*3/uL — ABNORMAL HIGH (ref 0.7–4.0)
MCH: 25.9 pg — ABNORMAL LOW (ref 26.0–34.0)
MCHC: 31.6 g/dL (ref 30.0–36.0)
MCV: 82 fL (ref 80.0–100.0)
Monocytes Absolute: 1.4 10*3/uL — ABNORMAL HIGH (ref 0.1–1.0)
Monocytes Relative: 7 %
Neutro Abs: 5 10*3/uL (ref 1.7–7.7)
Neutrophils Relative %: 25 %
Platelet Count: 165 10*3/uL (ref 150–400)
RBC: 4.28 MIL/uL (ref 4.22–5.81)
RDW: 16.4 % — ABNORMAL HIGH (ref 11.5–15.5)
WBC Count: 20 10*3/uL — ABNORMAL HIGH (ref 4.0–10.5)
nRBC: 0 % (ref 0.0–0.2)

## 2022-05-17 LAB — CMP (CANCER CENTER ONLY)
ALT: 18 U/L (ref 0–44)
AST: 21 U/L (ref 15–41)
Albumin: 4.3 g/dL (ref 3.5–5.0)
Alkaline Phosphatase: 58 U/L (ref 38–126)
Anion gap: 9 (ref 5–15)
BUN: 24 mg/dL — ABNORMAL HIGH (ref 8–23)
CO2: 28 mmol/L (ref 22–32)
Calcium: 9.4 mg/dL (ref 8.9–10.3)
Chloride: 105 mmol/L (ref 98–111)
Creatinine: 1.02 mg/dL (ref 0.61–1.24)
GFR, Estimated: >60 mL/min (ref >60)
Glucose, Bld: 154 mg/dL — ABNORMAL HIGH (ref 70–99)
Potassium: 3.9 mmol/L (ref 3.5–5.1)
Sodium: 142 mmol/L (ref 135–145)
Total Bilirubin: 0.8 mg/dL (ref 0.3–1.2)
Total Protein: 7.3 g/dL (ref 6.5–8.1)

## 2022-05-17 LAB — LACTATE DEHYDROGENASE: LDH: 111 U/L (ref 98–192)

## 2022-05-17 NOTE — Progress Notes (Signed)
[place] [phone]   [phone]  PROGRESS NOTE  Patient Care Team: [name], MD as PCP - General (Internal Medicine) Lorretta Harp, MD as PCP - Cardiology (Cardiology) Sanda Klein, MD as PCP - Electrophysiology (Cardiology) Lorretta Harp, MD as Consulting Physician (Cardiology) [place], Tami Lin, [place] as Physician Assistant (Cardiology)  Hematological/Oncological History # CLL Rai Stage 0. Del 13 1) 08/09/2021: Labs from PCP, Dr. [name]: --WBC 18.6, Hgb 11.7, MCV 80.4, Plt 164, Lymph 12.40.  2)  08/24/2021: [facility].  Flow cytometry results most consistent with CLL  Interval History:  Ludy Litherland Besaw 82 y.o. male with medical history significant for newly diagnosed CLL who presents for a follow up visit. The patient's last visit was on 12/01/2021. In the interim since the last visit he has had no major changes in his health.  On exam today Mr. Simard reports he has been having "a little setback".  He has been having issues with occasional cough and headache.  Has been seen by his family doctor and ENT.  He underwent direct visualization and reports that "everything looked good".  He notes that he did have a 10-day course of steroids which helped with his cough.  He reports that it does continue to come back.  He is also now on Symbicort.  He notes that he also did have an infection with Salmonella in the interim since our last visit where he spent 2 days in the emergency room and he had "the runs".  He notes that he has not been having any issues with lymphadenopathy or bumps or lumps.  He reports his appetite is steady though he has lost about 3 to 4 pounds in the interim since her last visit.  He reports that thankfully his stools are now back to being solid.  He reports that fatigue continues to be his largest issue.  He denies any early satiety, abdominal swelling, or enlarged lymph nodes.  He otherwise  denies any fevers, chills, sweats, nausea, vomiting or diarrhea.  Full 10 point ROS is listed below.   MEDICAL HISTORY:  Past Medical History:  Diagnosis Date   Arthritis    "all over" (11/25/2015)   CAD (coronary artery disease)    a. s/p CABG  06/2002; b. 02/01/15 PCI: DES to prox SVG to PDA, staged PCI of SVG to Diag in 02/2015; c. 04/2017 Cath/PCI: LM nl, LAD 100ost, 21m 75d, LCX 60ost, OM2 80, RCA 100ost, RPDA 80, LIMA->LAD ok, VG->D1 patent stent, VG->RPDA patent stent, 50p, VG->OM1->OM2 90p (3.0x24 Synergy DES), 100 between OM1->OM2 (med rx).   Cancer ([facility]    Right Shoulder, Left Leg- BCC, SCC, AND MELANOMA   Epithelioid hemangioendothelioma    s/p resection of left SFA/mass with interposition 6 mm GoreTex graft 06/02/10, s/p repeat resection for positive margins 09/22/10   Hyperlipidemia    Hypertension    Leg pain    OSA on CPAP    PAD [place]peripheral artery disease) ([place]    a. 10/2002 L SFA PTA/BMS; b. 8/17 LE Angio: LEIA 90 (9x40 self exp stent), LSFA short segment prox occlusion (staged PTA/stenting 01/03/2016), patent mid stent, RSFA 939mstaged PTA/DEB 02/14/2016).   Presence of permanent cardiac pacemaker    Medtronic   SSS (sick sinus syndrome) ([place]   a. s/p PPM in 2007 with gen change 04/2015 - Medtronic Adapta ADDRL1, ser # [id number].   Type II diabetes mellitus ([place]   Type II  Urgency of urination     SURGICAL HISTORY: Past Surgical History:  Procedure Laterality Date   CARDIAC CATHETERIZATION N/A 02/01/2015   Procedure: Left Heart Cath and Coronary Angiography;  Surgeon: Lorretta Harp, MD;  Location: [place] CV [facility];  Service: Cardiovascular;  Laterality: N/A;   CARDIAC CATHETERIZATION N/A 02/01/2015   Procedure: Coronary Stent Intervention;  Surgeon: Lorretta Harp, MD;  Location: [place] [facility];  Service: Cardiovascular;  Laterality: N/A;   CARDIAC CATHETERIZATION  06/2002   "just before bypass OR"   CARDIAC CATHETERIZATION N/A 03/01/2015    Procedure: Coronary Stent Intervention;  Surgeon: Lorretta Harp, MD;  Location: [place] CV LAB;  Service: Cardiovascular;  Laterality: N/A;   CARDIAC CATHETERIZATION  02/06/2018   COLONOSCOPY     CORONARY ANGIOPLASTY     CORONARY ARTERY BYPASS GRAFT  06/2002   x5, LIMA-LAD;VG- Diag; seq VG- ramus & OM branch; VG-PDA   CORONARY STENT INTERVENTION N/A 04/12/2017   Procedure: CORONARY STENT INTERVENTION;  Surgeon: Lorretta Harp, MD;  Location: [place] CV LAB;  Service: Cardiovascular;  Laterality: N/A;   CORONARY STENT INTERVENTION N/A 02/08/2018   Procedure: CORONARY STENT INTERVENTION;  Surgeon: Jettie Booze, MD;  Location: [place] CV LAB;  Service: Cardiovascular;  Laterality: N/A;   CORONARY STENT INTERVENTION N/A 11/12/2018   Procedure: CORONARY STENT INTERVENTION;  Surgeon: Wellington Hampshire, MD;  Location: [place] [facility] LAB;  Service: Cardiovascular;  Laterality: N/A;   CORONARY STENT INTERVENTION N/A 06/22/2020   Procedure: CORONARY STENT INTERVENTION;  Surgeon: Jettie Booze, MD;  Location: [place] CV LAB;  Service: Cardiovascular;  Laterality: N/A;   EP IMPLANTABLE DEVICE N/A 04/23/2015   Procedure: PPM Generator Changeout;  Surgeon: Sanda Klein, MD;  Location: [place] CV LAB;  Service: Cardiovascular;  Laterality: N/A;   FALSE ANEURYSM REPAIR Left 11/29/2018   Procedure: REPAIR FALSE ANEURYSM LEFT RADIAL ARTERY;  Surgeon: [name], MD;  Location: [place];  Service: Vascular;  Laterality: Left;   FEMORAL ARTERY STENT Left ~ 2014   "taken out of my leg; couldn' catorgorize what kind so the put it under all 3"; cataroziepitheloid hemanioendotheliomau   FOOT FRACTURE SURGERY Left [place] / REPLACE / REMOVE PACEMAKER  10/13/05   right side, medtronic Adapta   KNEE HARDWARE REMOVAL Right 1950's   "3-4 months after the insertion"   KNEE SURGERY Right 1950's   "broke my lower leg; had to put pin in my knee to keep lower leg  in place til it healed"   LEFT HEART CATH AND CORS/GRAFTS ANGIOGRAPHY N/A 04/12/2017   Procedure: LEFT HEART CATH AND CORS/GRAFTS ANGIOGRAPHY;  Surgeon: Lorretta Harp, MD;  Location: [place] CV LAB;  Service: Cardiovascular;  Laterality: N/A;   LEFT HEART CATH AND CORS/GRAFTS ANGIOGRAPHY N/A 02/06/2018   Procedure: LEFT HEART CATH AND CORS/GRAFTS ANGIOGRAPHY;  Surgeon: Troy Sine, MD;  Location: [place] CV LAB;  Service: Cardiovascular;  Laterality: N/A;   LEFT HEART CATH AND CORS/GRAFTS ANGIOGRAPHY N/A 11/12/2018   Procedure: LEFT HEART CATH AND CORS/GRAFTS ANGIOGRAPHY;  Surgeon: Wellington Hampshire, MD;  Location: [place] CV LAB;  Service: Cardiovascular;  Laterality: N/A;   LEFT HEART CATH AND CORS/GRAFTS ANGIOGRAPHY N/A 06/22/2020   Procedure: LEFT HEART CATH AND CORS/GRAFTS ANGIOGRAPHY;  Surgeon: Jettie Booze, MD;  Location: [place] CV LAB;  Service: Cardiovascular;  Laterality: N/A;   PERIPHERAL VASCULAR CATHETERIZATION N/A 11/25/2015  Procedure: Lower Extremity Angiography;  Surgeon: Lorretta Harp, MD;  Location: [place] CV LAB;  Service: Cardiovascular;  Laterality: N/A;   PERIPHERAL VASCULAR CATHETERIZATION Left 11/25/2015   Procedure: Peripheral Vascular Intervention;  Surgeon: Lorretta Harp, MD;  Location: [place] [facility];  Service: Cardiovascular;  Laterality: Left;  external iliac   PERIPHERAL VASCULAR CATHETERIZATION N/A 01/03/2016   Procedure: Lower Extremity Angiography;  Surgeon: Lorretta Harp, MD;  Location: [place] CV LAB;  Service: Cardiovascular;  Laterality: N/A;   PERIPHERAL VASCULAR CATHETERIZATION Left 01/03/2016   Procedure: Peripheral Vascular Intervention;  Surgeon: Lorretta Harp, MD;  Location: [place] CV LAB;  Service: Cardiovascular;  Laterality: Left;  SFA   PERIPHERAL VASCULAR CATHETERIZATION Right 02/14/2016   Procedure: Peripheral Vascular Atherectomy;  Surgeon: Lorretta Harp, MD;  Location: [place] CV LAB;   Service: Cardiovascular;  Laterality: Right;  SFA   POPLITEAL ARTERY STENT  01/03/2016   Contralateral access with a 7 [place] crossover sheath (second order catheter placement)   TONSILLECTOMY AND ADENOIDECTOMY     TUMOR EXCISION Right ~ 2005   cancerous tumor removed from shoulder   TUMOR EXCISION Right ~ 2000   benign tumor removed from under shoulder   TUMOR EXCISION Left 06/02/2010   resection of Lt SFA wth interposition of Gore-Tex graft    SOCIAL HISTORY: Social History   Socioeconomic History   Marital status: Married    Spouse name: Not on file   Number of children: Not on file   Years of education: Not on file   Highest education level: Not on file  Occupational History   Not on file  Tobacco Use   Smoking status: Former    Types: Pipe    Quit date: 90    Years since quitting: 51.1   Smokeless tobacco: Never   Tobacco comments:    "quit smoking in 1973"  Vaping Use   Vaping Use: Never used  Substance and Sexual Activity   Alcohol use: No   Drug use: No   Sexual activity: Not Currently  Other Topics Concern   Not on file  Social History Narrative   Not on file   Social Determinants of Health   Financial Resource Strain: Not on file  Food Insecurity: Not on file  Transportation Needs: Not on file  Physical Activity: Not on file  Stress: Not on file  Social Connections: Not on file  Intimate Partner Violence: Not on file    FAMILY HISTORY: Family History  Problem Relation Age of Onset   Coronary artery disease Mother    Heart attack Mother    Hypertension Mother    Heart disease Mother        Open  Heart surgery   Diabetes Father    Heart disease Father    Hyperlipidemia Father    Heart attack Father    Hypertension Sister    Diabetes Sister    Heart disease Sister     ALLERGIES:  is allergic to peanut-containing drug products, ace inhibitors, diltiazem hcl, meloxicam, eliquis [apixaban], sertraline hcl, carvedilol, clonidine hcl, and  duloxetine hcl.  MEDICATIONS:  Current Outpatient Medications  Medication Sig Dispense Refill   amLODipine (NORVASC) 10 MG tablet Take 10 mg by mouth daily.     atorvastatin (LIPITOR) 80 MG tablet Take 1 tablet (80 mg total) by mouth daily at 6 PM. 90 tablet 1   clopidogrel (PLAVIX) 75 MG tablet Take 1 tablet (75 mg total) by mouth daily  with breakfast. 90 tablet 3   dorzolamide-timolol (COSOPT) 22.3-6.8 MG/ML ophthalmic solution Place 1 drop into both eyes 2 (two) times daily.     glipiZIDE (GLUCOTROL) 5 MG tablet Take 5 mg by mouth daily with breakfast.      hydrALAZINE (APRESOLINE) 25 MG tablet Take 1 tablet (25 mg total) by mouth as needed (for BP >150 sys or 90 dia). (Patient not taking: Reported on 01/09/2022)     hydrochlorothiazide (HYDRODIURIL) 25 MG tablet Take 25 mg by mouth every morning.     isosorbide mononitrate (IMDUR) 60 MG 24 hr tablet TAKE 1 TABLET EVERY DAY (Patient taking differently: Take 60 mg by mouth daily.) 90 tablet 1   ketorolac (ACULAR) 0.4 % SOLN Place 1 drop into both eyes 4 (four) times daily. (Patient not taking: Reported on 12/20/2021)     melatonin 5 MG TABS Take 5 mg by mouth at bedtime.     metFORMIN (GLUCOPHAGE) 1000 MG tablet Take 1 tablet (1,000 mg total) by mouth 2 (two) times daily with a meal. Restart on 3/18.     metoprolol tartrate (LOPRESSOR) 100 MG tablet Take 1 tablet (100 mg total) by mouth 2 (two) times daily. 180 tablet 3   MULTIPLE VITAMIN-FOLIC ACID PO Take 1 tablet by mouth daily.     nitroGLYCERIN (NITROSTAT) 0.4 MG SL tablet Place 1 tablet (0.4 mg total) under the tongue every 5 (five) minutes as needed for chest pain. 25 tablet 3   potassium chloride SA (K-DUR,KLOR-CON) 20 MEQ tablet Take 1 tablet (20 mEq total) by mouth daily. 15 tablet 0   ranolazine (RANEXA) 500 MG 12 hr tablet Take 500 mg by mouth 2 (two) times daily.     rivaroxaban (XARELTO) 20 MG TABS tablet TAKE 1 TABLET BY MOUTH DAILY WITH SUPPER 90 tablet 1   tamsulosin (FLOMAX)  0.4 MG CAPS capsule Take 0.4 mg by mouth 2 (two) times daily.      No current facility-administered medications for this visit.    REVIEW OF SYSTEMS:   Constitutional: ( - ) fevers, ( - )  chills , ( - ) night sweats Eyes: ( - ) blurriness of vision, ( - ) double vision, ( - ) watery eyes Ears, nose, mouth, throat, and face: ( - ) mucositis, ( - ) sore throat Respiratory: ( - ) cough, ( - ) dyspnea, ( - ) wheezes Cardiovascular: ( - ) palpitation, ( - ) chest discomfort, ( - ) lower extremity swelling Gastrointestinal:  ( - ) nausea, ( - ) heartburn, ( - ) change in bowel habits Skin: ( - ) abnormal skin rashes Lymphatics: ( - ) new lymphadenopathy, ( - ) easy bruising Neurological: ( - ) numbness, ( - ) tingling, ( - ) new weaknesses Behavioral/Psych: ( - ) mood change, ( - ) new changes  All other systems were reviewed with the patient and are negative.  PHYSICAL EXAMINATION: ECOG PERFORMANCE STATUS: 0 - Asymptomatic  Vitals:   05/17/22 1540  BP: 133/83  Pulse: 100  Resp: 17  Temp: (!) 97.5 F (36.4 C)  SpO2: 99%   Filed Weights   05/17/22 1540  Weight: 172 lb 8 oz (78.2 kg)    GENERAL: Well-appearing elderly Caucasian male, alert, no distress and comfortable SKIN: skin color, texture, turgor are normal, no rashes or significant lesions EYES: conjunctiva are Burns and non-injected, sclera clear NECK: supple, non-tender LYMPH:  no palpable lymphadenopathy in the cervical, axillary or inguinal LUNGS: clear to auscultation and  percussion with normal breathing effort HEART: regular rate & rhythm and no murmurs and no lower extremity edema ABDOMEN: No evidence of hepatosplenomegaly.  Soft, non-tender, non-distended, normal bowel sounds Musculoskeletal: no cyanosis of digits and no clubbing  PSYCH: alert & oriented x 3, fluent speech NEURO: no focal motor/sensory deficits  LABORATORY DATA:  I have reviewed the data as listed    Latest Ref Rng & Units 05/17/2022    2:33 PM  02/07/2022    2:28 AM 02/05/2022    5:25 PM  CBC  WBC 4.0 - 10.5 K/uL 20.0  15.1  16.8   Hemoglobin 13.0 - 17.0 g/dL 11.1  10.5  11.1   Hematocrit 39.0 - 52.0 % 35.1  33.7  34.8   Platelets 150 - 400 K/uL 165  151  176        Latest Ref Rng & Units 05/17/2022    2:33 PM 02/07/2022    2:28 AM 02/05/2022    5:25 PM  CMP  Glucose 70 - 99 mg/dL 154  132  72   BUN 8 - 23 mg/dL 24  23  20   $ Creatinine 0.61 - 1.24 mg/dL 1.02  1.12  1.05   Sodium 135 - 145 mmol/L 142  141  142   Potassium 3.5 - 5.1 mmol/L 3.9  3.9  4.0   Chloride 98 - 111 mmol/L 105  106  104   CO2 22 - 32 mmol/L 28  24  24   $ Calcium 8.9 - 10.3 mg/dL 9.4  9.2  9.4   Total Protein 6.5 - 8.1 g/dL 7.3     Total Bilirubin 0.3 - 1.2 mg/dL 0.8     Alkaline Phos 38 - 126 U/L 58     AST 15 - 41 U/L 21     ALT 0 - 44 U/L 18       RADIOGRAPHIC STUDIES: CUP PACEART REMOTE DEVICE CHECK  Result Date: 05/17/2022 Scheduled remote reviewed. Normal device function.  Next remote 91 days. [place]   ASSESSMENT & PLAN Robert Burns 82 y.o. male with medical history significant for newly diagnosed CLL who presents for a follow up visit.  Previously we discussed the diagnosis of CLL and treatment options moving forward.  We discussed that this is a chronic condition with no definitive cure.  Additionally we discussed that treatment is reserved until certain criteria are met.  Typically treatment is started with rapid increase in lymphocytes, massively enlarged lymph nodes, anemia, or thrombocytopenia.  The patient currently does not meet any criteria necessary to start treatment.  As such I would recommend continued close observation of his blood counts and symptoms.  The patient knows to call the clinic with any issues involving fevers, chills, sweats, sudden weight loss, or rapidly enlarging lymph node.  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   --Prognostic panel showed del 13 with positive ZAP 70  and negative IgVH --Labs today show white blood cell count 20.0, hemoglobin 11.1, platelets of 165 --No indication for imaging at this time --No criteria met for beginning treatment.  Would recommend continued monitoring --Return to clinic in 6 months time for further evaluation.  No orders of the defined types were placed in this encounter.   All questions were answered. The patient knows to call the clinic with any problems, questions or concerns.  A total of more than 30 minutes were spent on this encounter with face-to-face  time and non-face-to-face time, including preparing to see the patient, ordering tests and/or medications, counseling the patient and coordination of care as outlined above.   Ledell Peoples, MD Department of Hematology/Oncology [place] at [facility] Phone: [phone] Pager: [phone] Email: [email or web address]  05/21/2022 5:56 PM  Kristian Covey BD, Catovsky D, Caligaris-Cappio F, Dighiero G, Dhner H, [place], [place], [place] E, Chiorazzi N, [place], Rai KR, [place], Eichhorst B, O'Brien S, Robak T, Seymour JF, Kipps TJ. iwCLL guidelines for diagnosis, indications for treatment, response assessment, and supportive management of CLL. Blood. 2018 Jun 21;131(25):2745-2760.  Active disease should be clearly documented to initiate therapy. At least 1 of the following criteria should be met.  1) Evidence of progressive marrow failure as manifested by the development of, or worsening of, anemia and/or thrombocytopenia. Cutoff levels of Hb <10 g/dL or platelet counts <100  109/L are generally regarded as indication for treatment. However, in some patients, platelet counts <100  109/L may remain stable over a long period; this situation does not automatically require therapeutic intervention. 2) Massive (ie, ?6 cm below the left costal margin) or progressive or symptomatic splenomegaly. 3) Massive nodes (ie, ?10 cm  in longest diameter) or progressive or symptomatic lymphadenopathy. 4) Progressive lymphocytosis with an increase of ?50% over a 31-monthperiod, or lymphocyte doubling time (LDT) <6 months. LDT can be obtained by linear regression extrapolation of absolute lymphocyte counts obtained at intervals of 2 weeks over an observation period of 2 to 3 months; patients with initial blood lymphocyte counts <30  109/L may require a longer observation period to determine the LDT. Factors contributing to lymphocytosis other than CLL (eg, infections, steroid administration) should be excluded. 5) Autoimmune complications including anemia or thrombocytopenia poorly responsive to corticosteroids. 6) Symptomatic or functional extranodal involvement (eg, skin, kidney, lung, spine). Disease-related symptoms as defined by any of the following: Unintentional weight loss ?10% within the previous 6 months. Significant fatigue (ie, ECOG performance scale 2 or worse; cannot work or unable to perform usual activities). Fevers ?100.54F or 38.0C for 2 or more weeks without evidence of infection. Night sweats for ?1 month without evidence of infection.

## 2022-05-18 ENCOUNTER — Telehealth: Payer: Self-pay | Admitting: Hematology and Oncology

## 2022-05-18 NOTE — Telephone Encounter (Signed)
Called patient to schedule f/u per 2/7 los notes. Left voicemail with new appointment information and contact details if needing to reschedule.

## 2022-06-02 ENCOUNTER — Other Ambulatory Visit: Payer: Medicare HMO

## 2022-06-02 ENCOUNTER — Ambulatory Visit: Payer: Medicare HMO | Admitting: Hematology and Oncology

## 2022-06-07 ENCOUNTER — Encounter: Payer: Self-pay | Admitting: Hematology and Oncology

## 2022-06-14 NOTE — Progress Notes (Signed)
Remote pacemaker transmission.   

## 2022-06-16 DIAGNOSIS — I25709 Atherosclerosis of coronary artery bypass graft(s), unspecified, with unspecified angina pectoris: Secondary | ICD-10-CM | POA: Diagnosis not present

## 2022-06-16 DIAGNOSIS — I10 Essential (primary) hypertension: Secondary | ICD-10-CM | POA: Diagnosis not present

## 2022-06-16 DIAGNOSIS — I480 Paroxysmal atrial fibrillation: Secondary | ICD-10-CM | POA: Diagnosis not present

## 2022-06-16 DIAGNOSIS — I495 Sick sinus syndrome: Secondary | ICD-10-CM | POA: Diagnosis not present

## 2022-06-16 DIAGNOSIS — C9110 Chronic lymphocytic leukemia of B-cell type not having achieved remission: Secondary | ICD-10-CM | POA: Diagnosis not present

## 2022-06-16 DIAGNOSIS — E1142 Type 2 diabetes mellitus with diabetic polyneuropathy: Secondary | ICD-10-CM | POA: Diagnosis not present

## 2022-06-16 DIAGNOSIS — E1151 Type 2 diabetes mellitus with diabetic peripheral angiopathy without gangrene: Secondary | ICD-10-CM | POA: Diagnosis not present

## 2022-06-16 DIAGNOSIS — D6869 Other thrombophilia: Secondary | ICD-10-CM | POA: Diagnosis not present

## 2022-06-27 DIAGNOSIS — H35372 Puckering of macula, left eye: Secondary | ICD-10-CM | POA: Diagnosis not present

## 2022-06-27 DIAGNOSIS — H34811 Central retinal vein occlusion, right eye, with macular edema: Secondary | ICD-10-CM | POA: Diagnosis not present

## 2022-06-27 DIAGNOSIS — H401234 Low-tension glaucoma, bilateral, indeterminate stage: Secondary | ICD-10-CM | POA: Diagnosis not present

## 2022-06-27 DIAGNOSIS — H35352 Cystoid macular degeneration, left eye: Secondary | ICD-10-CM | POA: Diagnosis not present

## 2022-06-29 ENCOUNTER — Other Ambulatory Visit: Payer: Self-pay | Admitting: Cardiovascular Disease

## 2022-07-06 DIAGNOSIS — L570 Actinic keratosis: Secondary | ICD-10-CM | POA: Diagnosis not present

## 2022-07-06 DIAGNOSIS — D485 Neoplasm of uncertain behavior of skin: Secondary | ICD-10-CM | POA: Diagnosis not present

## 2022-07-06 DIAGNOSIS — L821 Other seborrheic keratosis: Secondary | ICD-10-CM | POA: Diagnosis not present

## 2022-07-19 ENCOUNTER — Telehealth: Payer: Self-pay | Admitting: Cardiovascular Disease

## 2022-07-19 NOTE — Telephone Encounter (Signed)
Left message to call back, call back or go to the ER to be evaluated.

## 2022-07-19 NOTE — Telephone Encounter (Signed)
Pt c/o of Chest Pain: STAT if CP now or developed within 24 hours  1. Are you having CP right now? No  2. Are you experiencing any other symptoms (ex. SOB, nausea, vomiting, sweating)? No  3. How long have you been experiencing CP? Few weeks  4. Is your CP continuous or coming and going? Come and go  5. Have you taken Nitroglycerin? No ?

## 2022-07-20 NOTE — Telephone Encounter (Signed)
Straight to VM.  Left message to please cal as we are following up if patient went to ER as no notes seen and to see how he is doing.

## 2022-07-24 ENCOUNTER — Encounter: Payer: Self-pay | Admitting: *Deleted

## 2022-07-24 DIAGNOSIS — C4359 Malignant melanoma of other part of trunk: Secondary | ICD-10-CM | POA: Diagnosis not present

## 2022-07-24 DIAGNOSIS — L578 Other skin changes due to chronic exposure to nonionizing radiation: Secondary | ICD-10-CM | POA: Diagnosis not present

## 2022-07-24 DIAGNOSIS — L820 Inflamed seborrheic keratosis: Secondary | ICD-10-CM | POA: Diagnosis not present

## 2022-07-24 DIAGNOSIS — L728 Other follicular cysts of the skin and subcutaneous tissue: Secondary | ICD-10-CM | POA: Diagnosis not present

## 2022-07-25 NOTE — Telephone Encounter (Signed)
Left message for pt to call.

## 2022-07-26 NOTE — Telephone Encounter (Signed)
Spoke with patient, did not go to the ED. States Dr Allyson Sabal and his family state this is going to occur due to his heart failure. When having chest pain he will  sit down and relax. Takes Nitro once or twice a week X1 dose. He states there is no ryme or reason to pain. 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  It just comes and goes with no reasons.  Advised to keep appt. 5/22.  Also if chest pain does not resolve in the 5-10 minutes with rest, to take one nitro.  If does not resolve in 5 minutes, take a 2nd dose. If not resolved take a third and call EMS .  He states understanding.

## 2022-08-01 DIAGNOSIS — D0359 Melanoma in situ of other part of trunk: Secondary | ICD-10-CM | POA: Diagnosis not present

## 2022-08-15 ENCOUNTER — Ambulatory Visit (INDEPENDENT_AMBULATORY_CARE_PROVIDER_SITE_OTHER): Payer: Medicare HMO

## 2022-08-15 DIAGNOSIS — I495 Sick sinus syndrome: Secondary | ICD-10-CM

## 2022-08-15 LAB — CUP PACEART REMOTE DEVICE CHECK
Battery Impedance: 891 Ohm
Battery Remaining Longevity: 67 mo
Battery Voltage: 2.79 V
Brady Statistic AP VP Percent: 2 %
Brady Statistic AP VS Percent: 96 %
Brady Statistic AS VP Percent: 0 %
Brady Statistic AS VS Percent: 1 %
Date Time Interrogation Session: 20240506184154
Implantable Lead Connection Status: 753985
Implantable Lead Connection Status: 753985
Implantable Lead Implant Date: 20070706
Implantable Lead Implant Date: 20070706
Implantable Lead Location: 753859
Implantable Lead Location: 753860
Implantable Lead Model: 5076
Implantable Lead Model: 5092
Implantable Pulse Generator Implant Date: 20170113
Lead Channel Impedance Value: 380 Ohm
Lead Channel Impedance Value: 732 Ohm
Lead Channel Pacing Threshold Amplitude: 0.875 V
Lead Channel Pacing Threshold Amplitude: 1.5 V
Lead Channel Pacing Threshold Pulse Width: 0.4 ms
Lead Channel Pacing Threshold Pulse Width: 0.4 ms
Lead Channel Setting Pacing Amplitude: 1.875
Lead Channel Setting Pacing Amplitude: 3 V
Lead Channel Setting Pacing Pulse Width: 0.4 ms
Lead Channel Setting Sensing Sensitivity: 5.6 mV
Zone Setting Status: 755011
Zone Setting Status: 755011

## 2022-08-29 DIAGNOSIS — L0100 Impetigo, unspecified: Secondary | ICD-10-CM | POA: Diagnosis not present

## 2022-08-29 NOTE — Progress Notes (Unsigned)
Cardiology Clinic Note   Patient Name: Robert Burns Date of Encounter: 08/30/2022  Primary Care Provider:  [name], MD Primary Cardiologist:  Nanetta Batty, MD  Patient Profile    Robert Burns 82 year old male presents the clinic today for follow-up evaluation of his essential hypertension and coronary artery disease.  Past Medical History    Past Medical History:  Diagnosis Date   Arthritis    "all over" (11/25/2015)   CAD (coronary artery disease)    a. s/p CABG  06/2002; b. 02/01/15 PCI: DES to prox SVG to PDA, staged PCI of SVG to Diag in 02/2015; c. 04/2017 Cath/PCI: LM nl, LAD 100ost, 48m, 75d, LCX 60ost, OM2 80, RCA 100ost, RPDA 80, LIMA->LAD ok, VG->D1 patent stent, VG->RPDA patent stent, 50p, VG->OM1->OM2 90p (3.0x24 Synergy DES), 100 between OM1->OM2 (med rx).   Cancer [facility])    Right Shoulder, Left Leg- BCC, SCC, AND MELANOMA   Epithelioid hemangioendothelioma    s/p resection of left SFA/mass with interposition 6 mm GoreTex graft 06/02/10, s/p repeat resection for positive margins 09/22/10   Hyperlipidemia    Hypertension    Leg pain    OSA on CPAP    PAD (peripheral artery disease) (HCC)    a. 10/2002 L SFA PTA/BMS; b. 8/17 LE Angio: LEIA 90 (9x40 self exp stent), LSFA short segment prox occlusion (staged PTA/stenting 01/03/2016), patent mid stent, RSFA 89m (staged PTA/DEB 02/14/2016).   Presence of permanent cardiac pacemaker    Medtronic   SSS (sick sinus syndrome) (HCC)    a. s/p PPM in 2007 with gen change 04/2015 - Medtronic Adapta ADDRL1, ser # [id number].   Type II diabetes mellitus (HCC)    Type II   Urgency of urination    Past Surgical History:  Procedure Laterality Date   CARDIAC CATHETERIZATION N/A 02/01/2015   Procedure: Left Heart Cath and Coronary Angiography;  Surgeon: Runell Gess, MD;  Location: [facility] INVASIVE CV LAB;  Service: Cardiovascular;  Laterality: N/A;   CARDIAC CATHETERIZATION N/A 02/01/2015   Procedure: Coronary Stent  Intervention;  Surgeon: Runell Gess, MD;  Location: [facility] INVASIVE CV LAB;  Service: Cardiovascular;  Laterality: N/A;   CARDIAC CATHETERIZATION  06/2002   "just before bypass OR"   CARDIAC CATHETERIZATION N/A 03/01/2015   Procedure: Coronary Stent Intervention;  Surgeon: Runell Gess, MD;  Location: [facility] INVASIVE CV LAB;  Service: Cardiovascular;  Laterality: N/A;   CARDIAC CATHETERIZATION  02/06/2018   COLONOSCOPY     CORONARY ANGIOPLASTY     CORONARY ARTERY BYPASS GRAFT  06/2002   x5, LIMA-LAD;VG- Diag; seq VG- ramus & OM branch; VG-PDA   CORONARY STENT INTERVENTION N/A 04/12/2017   Procedure: CORONARY STENT INTERVENTION;  Surgeon: Runell Gess, MD;  Location: [facility] INVASIVE CV LAB;  Service: Cardiovascular;  Laterality: N/A;   CORONARY STENT INTERVENTION N/A 02/08/2018   Procedure: CORONARY STENT INTERVENTION;  Surgeon: Corky Crafts, MD;  Location: [facility] INVASIVE CV LAB;  Service: Cardiovascular;  Laterality: N/A;   CORONARY STENT INTERVENTION N/A 11/12/2018   Procedure: CORONARY STENT INTERVENTION;  Surgeon: Iran Ouch, MD;  Location: [facility] INVASIVE CV LAB;  Service: Cardiovascular;  Laterality: N/A;   CORONARY STENT INTERVENTION N/A 06/22/2020   Procedure: CORONARY STENT INTERVENTION;  Surgeon: Corky Crafts, MD;  Location: [facility] INVASIVE CV LAB;  Service: Cardiovascular;  Laterality: N/A;   EP IMPLANTABLE DEVICE N/A 04/23/2015   Procedure: PPM Generator Changeout;  Surgeon: Thurmon Fair, MD;  Location: [facility] INVASIVE CV  LAB;  Service: Cardiovascular;  Laterality: N/A;   FALSE ANEURYSM REPAIR Left 11/29/2018   Procedure: REPAIR FALSE ANEURYSM LEFT RADIAL ARTERY;  Surgeon: Larina Earthly, MD;  Location: [facility] OR;  Service: Vascular;  Laterality: Left;   FEMORAL ARTERY STENT Left ~ 2014   "taken out of my leg; couldn' catorgorize what kind so the put it under all 3"; cataroziepitheloid hemanioendotheliomau   FOOT FRACTURE SURGERY Left 1973   FRACTURE SURGERY     INSERT / REPLACE /  REMOVE PACEMAKER  10/13/05   right side, medtronic Adapta   KNEE HARDWARE REMOVAL Right 1950's   "3-4 months after the insertion"   KNEE SURGERY Right 1950's   "broke my lower leg; had to put pin in my knee to keep lower leg in place til it healed"   LEFT HEART CATH AND CORS/GRAFTS ANGIOGRAPHY N/A 04/12/2017   Procedure: LEFT HEART CATH AND CORS/GRAFTS ANGIOGRAPHY;  Surgeon: Runell Gess, MD;  Location: [facility] INVASIVE CV LAB;  Service: Cardiovascular;  Laterality: N/A;   LEFT HEART CATH AND CORS/GRAFTS ANGIOGRAPHY N/A 02/06/2018   Procedure: LEFT HEART CATH AND CORS/GRAFTS ANGIOGRAPHY;  Surgeon: Lennette Bihari, MD;  Location: [facility] INVASIVE CV LAB;  Service: Cardiovascular;  Laterality: N/A;   LEFT HEART CATH AND CORS/GRAFTS ANGIOGRAPHY N/A 11/12/2018   Procedure: LEFT HEART CATH AND CORS/GRAFTS ANGIOGRAPHY;  Surgeon: Iran Ouch, MD;  Location: [facility] INVASIVE CV LAB;  Service: Cardiovascular;  Laterality: N/A;   LEFT HEART CATH AND CORS/GRAFTS ANGIOGRAPHY N/A 06/22/2020   Procedure: LEFT HEART CATH AND CORS/GRAFTS ANGIOGRAPHY;  Surgeon: Corky Crafts, MD;  Location: [facility] INVASIVE CV LAB;  Service: Cardiovascular;  Laterality: N/A;   PERIPHERAL VASCULAR CATHETERIZATION N/A 11/25/2015   Procedure: Lower Extremity Angiography;  Surgeon: Runell Gess, MD;  Location: [facility] INVASIVE CV LAB;  Service: Cardiovascular;  Laterality: N/A;   PERIPHERAL VASCULAR CATHETERIZATION Left 11/25/2015   Procedure: Peripheral Vascular Intervention;  Surgeon: Runell Gess, MD;  Location: [facility] INVASIVE CV LAB;  Service: Cardiovascular;  Laterality: Left;  external iliac   PERIPHERAL VASCULAR CATHETERIZATION N/A 01/03/2016   Procedure: Lower Extremity Angiography;  Surgeon: Runell Gess, MD;  Location: [facility] INVASIVE CV LAB;  Service: Cardiovascular;  Laterality: N/A;   PERIPHERAL VASCULAR CATHETERIZATION Left 01/03/2016   Procedure: Peripheral Vascular Intervention;  Surgeon: Runell Gess, MD;  Location: [facility]  INVASIVE CV LAB;  Service: Cardiovascular;  Laterality: Left;  SFA   PERIPHERAL VASCULAR CATHETERIZATION Right 02/14/2016   Procedure: Peripheral Vascular Atherectomy;  Surgeon: Runell Gess, MD;  Location: [facility] INVASIVE CV LAB;  Service: Cardiovascular;  Laterality: Right;  SFA   POPLITEAL ARTERY STENT  01/03/2016   Contralateral access with a 7 [name] crossover sheath (second order catheter placement)   TONSILLECTOMY AND ADENOIDECTOMY     TUMOR EXCISION Right ~ 2005   cancerous tumor removed from shoulder   TUMOR EXCISION Right ~ 2000   benign tumor removed from under shoulder   TUMOR EXCISION Left 06/02/2010   resection of Lt SFA wth interposition of Gore-Tex graft    Allergies  Allergies  Allergen Reactions   Peanut-Containing Drug Products Anaphylaxis and Other (See Comments)    Tongue swelling is severe    Ace Inhibitors Cough        Diltiazem Hcl Other (See Comments)    UNSPECIFIED REACTION     Meloxicam Other (See Comments)    GI upset    Doxycycline Other (See Comments) and Cough    Reaction of  cough and runny nose   Eliquis [Apixaban] Other (See Comments)    dizziness   Sertraline Hcl Other (See Comments)   Carvedilol Itching   Clonidine Hcl Other (See Comments)    Patch only - skin irritation   Duloxetine Hcl Other (See Comments)    Urinary frequency    History of Present Illness    Robert Burns has a PMH of essential hypertension, sick sinus syndrome status post PPM, coronary artery disease, paroxysmal atrial fibrillation, stable angina, OSA on CPAP, type 2 diabetes, acute encephalopathy, hyperlipidemia, and chronic lymphocytic leukemia.  He has had multiple revascularization procedures, CABG 2004, DES to SVG-PDA and SVG-diagonal in 2016, DES x 2 to LAD and in-stent angioplasty for restenosis SVG-diagonal and proximal limb of SVG-OM 2019.  His PMH also includes PAD with angioplasty of left SFA 2004 and stent 9/17, right SFA 11/17.  Echocardiogram 02/07/2018  showed biatrial dilation.  Nuclear stress test 2018 showed normal perfusion and EF of 42%.  Had subsequent echo which showed EF 55%.  Underwent cardiac catheterization 8/20 with stenting to SVG-PDA and SVG-diagonal.  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  He underwent cardiac catheterization 06/22/2020.  He had generator change out in 2017.  CHA2DS2-VASc score 5 on Xarelto.  He presented to the emergency department (02/06/2022) with altered mental status and dizziness with intermittent confusion and pressured speech in addition to increased angina.  He was admitted.  His Xarelto was held.  He was placed on IV heparin.  His CT head was negative.  It was felt that he had a possible TIA.  His EKG at that time showed atrial paced and ventricular sensed rhythm with LBBB and no acute ST changes.  He contacted the nurse triage line on 07/19/2022.  He reported episodes of chest discomfort that would come and go over the past few weeks.  He presents to the clinic today for evaluation and states he has been having intermittent chest discomfort.  He presents with his daughter.  He reports that he has been noticing chest discomfort at rest and with physical activity.  His episodes are 3 to 4 minutes and dissipate with rest.  He has used 1 dose of nitroglycerin this week which relieved his chest discomfort.  We reviewed his prior cardiac catheterization and echocardiograms.  They expressed understanding.  He reports that he had a mass removed from his back.  The area has become infected and continues to weep.  I have asked him to increase the iron in his diet and start the prescribed antibiotic.  It appears that his angina is relatively stable.  I will increase his Ranexa to 1000 mg twice daily order CBC, BMP and plan follow-up in 1 month.    Home Medications    Prior to Admission medications   Medication Sig Start Date End Date Taking? Authorizing Provider  amLODipine (NORVASC)  10 MG tablet Take 10 mg by mouth daily.    [provider]  atorvastatin (LIPITOR) 80 MG tablet Take 1 tablet (80 mg total) by mouth daily at 6 PM. 02/09/18   Janetta Hora, PA-C  clopidogrel (PLAVIX) 75 MG tablet Take 1 tablet (75 mg total) by mouth daily with breakfast. 06/24/20   Duke, Roe Rutherford, PA  dorzolamide-timolol (COSOPT) 22.3-6.8 MG/ML ophthalmic solution Place 1 drop into both eyes 2 (two) times daily. 04/30/20   [provider]  glipiZIDE (GLUCOTROL) 5 MG tablet Take 5 mg by mouth daily with breakfast.  01/10/18  [provider]  hydrALAZINE (APRESOLINE) 25 MG tablet Take 1 tablet (25 mg total) by mouth as needed (for BP >150 sys or 90 dia). Patient not taking: Reported on 01/09/2022 09/23/20   Ronney Asters, NP  hydrochlorothiazide (HYDRODIURIL) 25 MG tablet Take 25 mg by mouth every morning.    [provider]  isosorbide mononitrate (IMDUR) 60 MG 24 hr tablet TAKE 1 TABLET EVERY DAY 06/29/22   Runell Gess, MD  ketorolac (ACULAR) 0.4 % SOLN Place 1 drop into both eyes 4 (four) times daily. Patient not taking: Reported on 12/20/2021 06/01/20   [provider]  melatonin 5 MG TABS Take 5 mg by mouth at bedtime.    [provider]  metFORMIN (GLUCOPHAGE) 1000 MG tablet Take 1 tablet (1,000 mg total) by mouth 2 (two) times daily with a meal. Restart on 3/18. 06/23/20   Duke, Roe Rutherford, PA  metoprolol tartrate (LOPRESSOR) 100 MG tablet Take 1 tablet (100 mg total) by mouth 2 (two) times daily. 08/07/17   Jodelle Gross, NP  MULTIPLE VITAMIN-FOLIC ACID PO Take 1 tablet by mouth daily.    [provider]  nitroGLYCERIN (NITROSTAT) 0.4 MG SL tablet Place 1 tablet (0.4 mg total) under the tongue every 5 (five) minutes as needed for chest pain. 04/15/17   Creig Hines, NP  potassium chloride SA (K-DUR,KLOR-CON) 20 MEQ tablet Take 1 tablet (20 mEq total) by mouth daily. 02/18/16   Regalado, Belkys A, MD   ranolazine (RANEXA) 500 MG 12 hr tablet Take 500 mg by mouth 2 (two) times daily. 01/09/22   [provider]  rivaroxaban (XARELTO) 20 MG TABS tablet TAKE 1 TABLET BY MOUTH DAILY WITH SUPPER 01/10/22   Croitoru, Mihai, MD  tamsulosin (FLOMAX) 0.4 MG CAPS capsule Take 0.4 mg by mouth 2 (two) times daily.  03/09/15   [provider]    Family History    Family History  Problem Relation Age of Onset   Coronary artery disease Mother    Heart attack Mother    Hypertension Mother    Heart disease Mother        Open  Heart surgery   Diabetes Father    Heart disease Father    Hyperlipidemia Father    Heart attack Father    Hypertension Sister    Diabetes Sister    Heart disease Sister    He indicated that his mother is deceased. He indicated that his father is deceased. He indicated that his sister is alive. He indicated that his maternal grandmother is deceased. He indicated that his maternal grandfather is deceased. He indicated that his paternal grandmother is deceased. He indicated that his paternal grandfather is deceased.  Social History    Social History   Socioeconomic History   Marital status: Married    Spouse name: Not on file   Number of children: Not on file   Years of education: Not on file   Highest education level: Not on file  Occupational History   Not on file  Tobacco Use   Smoking status: Former    Types: Pipe    Quit date: 71    Years since quitting: 51.4   Smokeless tobacco: Never   Tobacco comments:    "quit smoking in 1973"  Vaping Use   Vaping Use: Never used  Substance and Sexual Activity   Alcohol use: No   Drug use: No   Sexual activity: Not Currently  Other Topics Concern  Not on file  Social History Narrative   Not on file   Social Determinants of Health   Financial Resource Strain: Not on file  Food Insecurity: Not on file  Transportation Needs: Not on file  Physical Activity: Not on file  Stress: Not on file   Social Connections: Not on file  Intimate Partner Violence: Not on file     Review of Systems    General:  No chills, fever, night sweats or weight changes.  Cardiovascular:  No chest pain, dyspnea on exertion, edema, orthopnea, palpitations, paroxysmal nocturnal dyspnea. Dermatological: No rash, lesions/masses Respiratory: No cough, dyspnea Urologic: No hematuria, dysuria Abdominal:   No nausea, vomiting, diarrhea, bright red blood per rectum, melena, or hematemesis Neurologic:  No visual changes, wkns, changes in mental status. All other systems reviewed and are otherwise negative except as noted above.  Physical Exam    VS:  BP (!) 118/54 (BP Location: Right Arm, Patient Position: Sitting, Cuff Size: Normal)   Pulse 64   Ht 5\' 7"  (1.702 m)   Wt 172 lb 6.4 oz (78.2 kg)   SpO2 96%   BMI 27.00 kg/m  , BMI Body mass index is 27 kg/m. GEN: Well nourished, well developed, in no acute distress. HEENT: normal. Neck: Supple, no JVD, carotid bruits, or masses. Cardiac: RRR, no murmurs, rubs, or gallops. No clubbing, cyanosis, edema.  Radials/DP/PT 2+ and equal bilaterally.  Respiratory:  Respirations regular and unlabored, clear to auscultation bilaterally. GI: Soft, nontender, nondistended, BS + x 4. MS: no deformity or atrophy. Skin: warm and dry, no rash. Neuro:  Strength and sensation are intact. Psych: Normal affect.  Accessory Clinical Findings    Recent Labs: 05/17/2022: ALT 18; BUN 24; Creatinine 1.02; Hemoglobin 11.1; Platelet Count 165; Potassium 3.9; Sodium 142   Recent Lipid Panel    Component Value Date/Time   CHOL 88 (L) 06/30/2021 1120   TRIG 71 06/30/2021 1120   HDL 35 (L) 06/30/2021 1120   CHOLHDL 2.5 06/30/2021 1120   CHOLHDL 2.6 06/23/2020 0325   VLDL 14 06/23/2020 0325   LDLCALC 38 06/30/2021 1120         ECG personally reviewed by me today-none today.  Echocardiogram 06/22/2020  IMPRESSIONS     1. Left ventricular ejection fraction, by  estimation, is 55 to 60%. The  left ventricle has normal function. The left ventricle demonstrates  regional wall motion abnormalities (see scoring diagram/findings for  description). There is mild concentric left  ventricular hypertrophy. Left ventricular diastolic parameters are  consistent with Grade II diastolic dysfunction (pseudonormalization).  Elevated left atrial pressure. There is mild hypokinesis of the left  ventricular, basal inferior wall.   2. Right ventricular systolic function is normal. The right ventricular  size is normal. There is normal pulmonary artery systolic pressure. The  estimated right ventricular systolic pressure is 30.1 mmHg.   3. Left atrial size was moderately dilated.   4. Right atrial size was mildly dilated.   5. The mitral valve is normal in structure. No evidence of mitral valve  regurgitation. No evidence of mitral stenosis.   6. The aortic valve is normal in structure. Aortic valve regurgitation is  not visualized. No aortic stenosis is present.   7. The inferior vena cava is normal in size with greater than 50%  respiratory variability, suggesting right atrial pressure of 3 mmHg.   Comparison(s): No significant change from prior study. Prior images  reviewed side by side. The left ventricular function is unchanged.  The  left ventricular wall motion abnormality is unchanged.   FINDINGS   Left Ventricle: Left ventricular ejection fraction, by estimation, is 55  to 60%. The left ventricle has normal function. The left ventricle  demonstrates regional wall motion abnormalities. Mild hypokinesis of the  left ventricular, basal inferior wall.  The left ventricular internal cavity size was normal in size. There is  mild concentric left ventricular hypertrophy. Abnormal (paradoxical)  septal motion consistent with post-operative status. Left ventricular  diastolic parameters are consistent with  Grade II diastolic dysfunction (pseudonormalization).  Elevated left atrial  pressure.   Right Ventricle: The right ventricular size is normal. No increase in  right ventricular wall thickness. Right ventricular systolic function is  normal. There is normal pulmonary artery systolic pressure. The tricuspid  regurgitant velocity is 2.35 m/s, and   with an assumed right atrial pressure of 8 mmHg, the estimated right  ventricular systolic pressure is 30.1 mmHg.   Left Atrium: Left atrial size was moderately dilated.   Right Atrium: Right atrial size was mildly dilated.   Pericardium: There is no evidence of pericardial effusion.   Mitral Valve: The mitral valve is normal in structure. No evidence of  mitral valve regurgitation. No evidence of mitral valve stenosis. MV peak  gradient, 4.7 mmHg. The mean mitral valve gradient is 2.0 mmHg.   Tricuspid Valve: The tricuspid valve is normal in structure. Tricuspid  valve regurgitation is not demonstrated. No evidence of tricuspid  stenosis.   Aortic Valve: The aortic valve is normal in structure. Aortic valve  regurgitation is not visualized. No aortic stenosis is present. Aortic  valve mean gradient measures 2.0 mmHg. Aortic valve peak gradient measures  4.1 mmHg. Aortic valve area, by VTI  measures 2.47 cm.   Pulmonic Valve: The pulmonic valve was normal in structure. Pulmonic valve  regurgitation is not visualized. No evidence of pulmonic stenosis.   Aorta: The aortic root is normal in size and structure.   Venous: The inferior vena cava is normal in size with greater than 50%  respiratory variability, suggesting right atrial pressure of 3 mmHg.   IAS/Shunts: No atrial level shunt detected by color flow Doppler.   Additional Comments: A device lead is visualized.    Nuclear stress test 11/12/2020  There was no ST segment deviation noted during stress. The left ventricular ejection fraction is moderately decreased (30-44%). Nuclear stress EF: 37%. Defect 1: There is a small defect  of mild severity present in the basal inferior and mid inferior location. Findings consistent with prior myocardial infarction with peri-infarct ischemia. This is an intermediate risk study.   1. Partially reversible basal to mid inferior perfusion defect, consistent with infarct with peri-infarct ischemia. 2. Intermediate risk study due to moderate systolic dysfunction (37%).  Amount of ischemia is small      Assessment & Plan   1.  Chest discomfort-intermittent.  Lasting 3-4 minutes.  Radiates from right arm across chest.  Relieved with rest.  Nonexertional.  Atypical in nature. Continue as needed nitroglycerin No plans for ischemic evaluation at this time Order CBC, BMP  Paroxysmal atrial fibrillation-Hr today 64 bpm.  Reports compliance with Xarelto and denies bleeding issues.  CHA2DS2-VASc score 5. Continue Xarelto, metoprolol Avoid triggers caffeine, chocolate, EtOH, dehydration etc.  Sick sinus syndrome, PPM-device interrogation 08/14/2022.  Not pacemaker dependent.  Battery status and leads appropriate.  Appropriate histograms.  No significant episodes of high ventricular rate or atrial mode switch. Follows with Dr. Royann Shivers  Essential hypertension-BP  today 118/54. Continue current medical therapy Maintain blood pressure log Low-sodium diet  Coronary artery disease-compliant with medical therapy.  Has had multiple PCI and CABG. Continue Imdur, Plavix, atorvastatin, amlodipine, metoprolol Increase Ranexa to 1000 mg twice daily Low-sodium diet  Hyperlipidemia-LDL 38 on 06/30/2021 High-fiber diet Continue atorvastatin, Plavix  Disposition: Follow-up with Dr.Berry or me in 1 months.   Thomasene Ripple. Wiatt Mahabir NP-C     08/30/2022, 2:33 PM [facility] [address] Office [phone] Fax [phone]    I spent 14 minutes examining this patient, reviewing medications, and using patient centered shared decision making involving her  cardiac care.  Prior to her visit I spent greater than 20 minutes reviewing her past medical history,  medications, and prior cardiac tests.

## 2022-08-30 ENCOUNTER — Encounter: Payer: Self-pay | Admitting: General Practice

## 2022-08-30 ENCOUNTER — Ambulatory Visit: Payer: Medicare HMO | Attending: General Practice | Admitting: General Practice

## 2022-08-30 VITALS — BP 118/54 | HR 64 | Ht 67.0 in | Wt 172.4 lb

## 2022-08-30 DIAGNOSIS — Z7984 Long term (current) use of oral hypoglycemic drugs: Secondary | ICD-10-CM | POA: Diagnosis not present

## 2022-08-30 DIAGNOSIS — E785 Hyperlipidemia, unspecified: Secondary | ICD-10-CM

## 2022-08-30 DIAGNOSIS — Z950 Presence of cardiac pacemaker: Secondary | ICD-10-CM

## 2022-08-30 DIAGNOSIS — R0789 Other chest pain: Secondary | ICD-10-CM | POA: Diagnosis not present

## 2022-08-30 DIAGNOSIS — E1169 Type 2 diabetes mellitus with other specified complication: Secondary | ICD-10-CM | POA: Diagnosis not present

## 2022-08-30 DIAGNOSIS — I10 Essential (primary) hypertension: Secondary | ICD-10-CM | POA: Diagnosis not present

## 2022-08-30 DIAGNOSIS — I480 Paroxysmal atrial fibrillation: Secondary | ICD-10-CM

## 2022-08-30 DIAGNOSIS — I495 Sick sinus syndrome: Secondary | ICD-10-CM

## 2022-08-30 DIAGNOSIS — I25708 Atherosclerosis of coronary artery bypass graft(s), unspecified, with other forms of angina pectoris: Secondary | ICD-10-CM | POA: Diagnosis not present

## 2022-08-30 MED ORDER — RANOLAZINE ER 1000 MG PO TB12: 1000.0000 mg | ORAL_TABLET | Freq: Two times a day (BID) | ORAL | 5 refills | Status: DC

## 2022-08-30 NOTE — Patient Instructions (Addendum)
Medication Instructions:  Increase Ranexa 1000 mg twice daily  *If you need a refill on your cardiac medications before your next appointment, please call your pharmacy*   Lab Work: CBC & BMET today  If you have labs (blood work) drawn today and your tests are completely normal, you will receive your results only by: MyChart Message (if you have MyChart) OR A paper copy in the mail If you have any lab test that is abnormal or we need to change your treatment, we will call you to review the results.   Testing/Procedures: NONE ordered at this time of appointment     Follow-Up: At [facility], you and your health needs are our priority.  As part of our continuing mission to provide you with exceptional heart care, we have created designated Provider Care Teams.  These Care Teams include your primary Cardiologist (physician) and Advanced Practice Providers (APPs -  Physician Assistants and Nurse Practitioners) who all work together to provide you with the care you need, when you need it.  We recommend signing up for the patient portal called "MyChart".  Sign up information is provided on this After Visit Summary.  MyChart is used to connect with patients for Virtual Visits (Telemedicine).  Patients are able to view lab/test results, encounter notes, upcoming appointments, etc.  Non-urgent messages can be sent to your provider as well.   To learn more about what you can do with MyChart, go to [email or web address].    Your next appointment:   1 month(s)  Provider:   Edd Fabian, FNP        Other Instructions Ok to start Antibiotic given by other provider. Please add iron enriched food to your diet.

## 2022-08-31 ENCOUNTER — Telehealth: Payer: Self-pay

## 2022-08-31 LAB — CBC
Hematocrit: 32.6 % — ABNORMAL LOW (ref 37.5–51.0)
Hemoglobin: 10.2 g/dL — ABNORMAL LOW (ref 13.0–17.7)
MCH: 25.4 pg — ABNORMAL LOW (ref 26.6–33.0)
MCHC: 31.3 g/dL — ABNORMAL LOW (ref 31.5–35.7)
MCV: 81 fL (ref 79–97)
Platelets: 190 10*3/uL (ref 150–450)
RBC: 4.01 x10E6/uL — ABNORMAL LOW (ref 4.14–5.80)
RDW: 16.3 % — ABNORMAL HIGH (ref 11.6–15.4)
WBC: 17.9 10*3/uL — ABNORMAL HIGH (ref 3.4–10.8)

## 2022-08-31 LAB — BASIC METABOLIC PANEL
BUN/Creatinine Ratio: 25 — ABNORMAL HIGH (ref 10–24)
BUN: 25 mg/dL (ref 8–27)
CO2: 20 mmol/L (ref 20–29)
Calcium: 9.8 mg/dL (ref 8.6–10.2)
Chloride: 107 mmol/L — ABNORMAL HIGH (ref 96–106)
Creatinine, Ser: 0.99 mg/dL (ref 0.76–1.27)
Glucose: 103 mg/dL — ABNORMAL HIGH (ref 70–99)
Potassium: 4.6 mmol/L (ref 3.5–5.2)
Sodium: 146 mmol/L — ABNORMAL HIGH (ref 134–144)
eGFR: 76 mL/min/{1.73_m2} (ref >59)

## 2022-08-31 NOTE — Telephone Encounter (Addendum)
Left voice message for patient to give our office a call back for recent labs.   ----- Message from Ronney Asters, NP sent at 08/31/2022  6:18 AM EDT ----- Please contact Robert Burns and let him know that his lab work from yesterday has been reviewed.  His white blood cell count is elevated at 17.9.  This is an expected value related to his recent infection post resection from his back mass.  His hemoglobin and hematocrit are slightly worse than his previous drawl at 10.2 and 32.6.  Please ask him to continue to increase the iron in his diet.  His glucose is fairly well-controlled and his kidney function is stable.  We will continue with his current medication regimen.  Please forward these results to his PCP.  Thank you.

## 2022-09-06 NOTE — Progress Notes (Signed)
Remote pacemaker transmission.   

## 2022-09-07 ENCOUNTER — Telehealth: Payer: Self-pay | Admitting: Cardiovascular Disease

## 2022-09-07 NOTE — Telephone Encounter (Signed)
Patient calling to go over lab results. Please advise.

## 2022-09-07 NOTE — Telephone Encounter (Signed)
Left voicemail for patient to return call to office. 

## 2022-09-08 DIAGNOSIS — Z1330 Encounter for screening examination for mental health and behavioral disorders, unspecified: Secondary | ICD-10-CM | POA: Diagnosis not present

## 2022-09-08 DIAGNOSIS — G4733 Obstructive sleep apnea (adult) (pediatric): Secondary | ICD-10-CM | POA: Diagnosis not present

## 2022-09-08 NOTE — Telephone Encounter (Signed)
Call to patient , straight to VM. LM that our calls go straight to VM and to please reach out to [place]

## 2022-09-10 ENCOUNTER — Other Ambulatory Visit: Payer: Self-pay | Admitting: Cardiovascular Disease

## 2022-09-10 DIAGNOSIS — G8911 Acute pain due to trauma: Secondary | ICD-10-CM | POA: Diagnosis not present

## 2022-09-10 DIAGNOSIS — R9431 Abnormal electrocardiogram [ECG] [EKG]: Secondary | ICD-10-CM | POA: Diagnosis not present

## 2022-09-10 DIAGNOSIS — R0789 Other chest pain: Secondary | ICD-10-CM | POA: Diagnosis not present

## 2022-09-10 DIAGNOSIS — D72829 Elevated white blood cell count, unspecified: Secondary | ICD-10-CM | POA: Diagnosis not present

## 2022-09-10 DIAGNOSIS — S0990XA Unspecified injury of head, initial encounter: Secondary | ICD-10-CM | POA: Diagnosis not present

## 2022-09-10 DIAGNOSIS — Z856 Personal history of leukemia: Secondary | ICD-10-CM | POA: Diagnosis not present

## 2022-09-10 DIAGNOSIS — I2510 Atherosclerotic heart disease of native coronary artery without angina pectoris: Secondary | ICD-10-CM | POA: Diagnosis not present

## 2022-09-10 DIAGNOSIS — I10 Essential (primary) hypertension: Secondary | ICD-10-CM | POA: Diagnosis not present

## 2022-09-10 DIAGNOSIS — S8992XA Unspecified injury of left lower leg, initial encounter: Secondary | ICD-10-CM | POA: Diagnosis not present

## 2022-09-10 DIAGNOSIS — E119 Type 2 diabetes mellitus without complications: Secondary | ICD-10-CM | POA: Diagnosis not present

## 2022-09-10 DIAGNOSIS — R079 Chest pain, unspecified: Secondary | ICD-10-CM | POA: Diagnosis not present

## 2022-09-10 DIAGNOSIS — I440 Atrioventricular block, first degree: Secondary | ICD-10-CM | POA: Diagnosis not present

## 2022-09-10 DIAGNOSIS — M25562 Pain in left knee: Secondary | ICD-10-CM | POA: Diagnosis not present

## 2022-09-11 DIAGNOSIS — M25562 Pain in left knee: Secondary | ICD-10-CM | POA: Diagnosis not present

## 2022-09-11 NOTE — Telephone Encounter (Signed)
Third attempt to contact patient.  Advised if still needs results to please call our office, otherwise we will see him at next scheduled appointment.

## 2022-09-11 NOTE — Telephone Encounter (Signed)
Prescription refill request for Xarelto received.  Indication:AFIB Last office visit:5/24 Weight:78.2  kg Age:82 Scr:0.99 CrCl:62.61  ml/min  Prescription refilled

## 2022-09-19 DIAGNOSIS — I10 Essential (primary) hypertension: Secondary | ICD-10-CM | POA: Diagnosis not present

## 2022-09-19 DIAGNOSIS — Z950 Presence of cardiac pacemaker: Secondary | ICD-10-CM | POA: Diagnosis not present

## 2022-09-19 DIAGNOSIS — G319 Degenerative disease of nervous system, unspecified: Secondary | ICD-10-CM | POA: Diagnosis not present

## 2022-09-19 DIAGNOSIS — W57XXXA Bitten or stung by nonvenomous insect and other nonvenomous arthropods, initial encounter: Secondary | ICD-10-CM | POA: Diagnosis not present

## 2022-09-19 DIAGNOSIS — I25708 Atherosclerosis of coronary artery bypass graft(s), unspecified, with other forms of angina pectoris: Secondary | ICD-10-CM | POA: Diagnosis not present

## 2022-09-19 DIAGNOSIS — R296 Repeated falls: Secondary | ICD-10-CM | POA: Diagnosis not present

## 2022-09-19 DIAGNOSIS — E1121 Type 2 diabetes mellitus with diabetic nephropathy: Secondary | ICD-10-CM | POA: Diagnosis not present

## 2022-09-19 DIAGNOSIS — Z8614 Personal history of Methicillin resistant Staphylococcus aureus infection: Secondary | ICD-10-CM | POA: Diagnosis not present

## 2022-09-19 DIAGNOSIS — R002 Palpitations: Secondary | ICD-10-CM | POA: Diagnosis not present

## 2022-09-19 DIAGNOSIS — F0283 Dementia in other diseases classified elsewhere, unspecified severity, with mood disturbance: Secondary | ICD-10-CM | POA: Diagnosis not present

## 2022-09-19 DIAGNOSIS — F330 Major depressive disorder, recurrent, mild: Secondary | ICD-10-CM | POA: Diagnosis not present

## 2022-09-22 NOTE — Progress Notes (Unsigned)
Cardiology Clinic Note   Patient Name: Robert Burns Date of Encounter: 09/25/2022  Primary Care Provider:  [name], MD (Inactive) Primary Cardiologist:  Nanetta Batty, MD  Patient Profile    Robert Burns 82 year old male presents the clinic today for follow-up evaluation of his essential hypertension and coronary artery disease.  Past Medical History    Past Medical History:  Diagnosis Date   Arthritis    "all over" (11/25/2015)   CAD (coronary artery disease)    a. s/p CABG  06/2002; b. 02/01/15 PCI: DES to prox SVG to PDA, staged PCI of SVG to Diag in 02/2015; c. 04/2017 Cath/PCI: LM nl, LAD 100ost, 62m, 75d, LCX 60ost, OM2 80, RCA 100ost, RPDA 80, LIMA->LAD ok, VG->D1 patent stent, VG->RPDA patent stent, 50p, VG->OM1->OM2 90p (3.0x24 Synergy DES), 100 between OM1->OM2 (med rx).   Cancer [facility])    Right Shoulder, Left Leg- BCC, SCC, AND MELANOMA   Epithelioid hemangioendothelioma    s/p resection of left SFA/mass with interposition 6 mm GoreTex graft 06/02/10, s/p repeat resection for positive margins 09/22/10   Hyperlipidemia    Hypertension    Leg pain    OSA on CPAP    PAD (peripheral artery disease) (HCC)    a. 10/2002 L SFA PTA/BMS; b. 8/17 LE Angio: LEIA 90 (9x40 self exp stent), LSFA short segment prox occlusion (staged PTA/stenting 01/03/2016), patent mid stent, RSFA 1m (staged PTA/DEB 02/14/2016).   Presence of permanent cardiac pacemaker    Medtronic   SSS (sick sinus syndrome) (HCC)    a. s/p PPM in 2007 with gen change 04/2015 - Medtronic Adapta ADDRL1, ser # [id number].   Type II diabetes mellitus (HCC)    Type II   Urgency of urination    Past Surgical History:  Procedure Laterality Date   CARDIAC CATHETERIZATION N/A 02/01/2015   Procedure: Left Heart Cath and Coronary Angiography;  Surgeon: Runell Gess, MD;  Location: [facility] INVASIVE CV LAB;  Service: Cardiovascular;  Laterality: N/A;   CARDIAC CATHETERIZATION N/A 02/01/2015   Procedure: Coronary  Stent Intervention;  Surgeon: Runell Gess, MD;  Location: [facility] INVASIVE CV LAB;  Service: Cardiovascular;  Laterality: N/A;   CARDIAC CATHETERIZATION  06/2002   "just before bypass OR"   CARDIAC CATHETERIZATION N/A 03/01/2015   Procedure: Coronary Stent Intervention;  Surgeon: Runell Gess, MD;  Location: [facility] INVASIVE CV LAB;  Service: Cardiovascular;  Laterality: N/A;   CARDIAC CATHETERIZATION  02/06/2018   COLONOSCOPY     CORONARY ANGIOPLASTY     CORONARY ARTERY BYPASS GRAFT  06/2002   x5, LIMA-LAD;VG- Diag; seq VG- ramus & OM branch; VG-PDA   CORONARY STENT INTERVENTION N/A 04/12/2017   Procedure: CORONARY STENT INTERVENTION;  Surgeon: Runell Gess, MD;  Location: [facility] INVASIVE CV LAB;  Service: Cardiovascular;  Laterality: N/A;   CORONARY STENT INTERVENTION N/A 02/08/2018   Procedure: CORONARY STENT INTERVENTION;  Surgeon: Corky Crafts, MD;  Location: [facility] INVASIVE CV LAB;  Service: Cardiovascular;  Laterality: N/A;   CORONARY STENT INTERVENTION N/A 11/12/2018   Procedure: CORONARY STENT INTERVENTION;  Surgeon: Iran Ouch, MD;  Location: [facility] INVASIVE CV LAB;  Service: Cardiovascular;  Laterality: N/A;   CORONARY STENT INTERVENTION N/A 06/22/2020   Procedure: CORONARY STENT INTERVENTION;  Surgeon: Corky Crafts, MD;  Location: [facility] INVASIVE CV LAB;  Service: Cardiovascular;  Laterality: N/A;   EP IMPLANTABLE DEVICE N/A 04/23/2015   Procedure: PPM Generator Changeout;  Surgeon: Thurmon Fair, MD;  Location: [facility] INVASIVE  CV LAB;  Service: Cardiovascular;  Laterality: N/A;   FALSE ANEURYSM REPAIR Left 11/29/2018   Procedure: REPAIR FALSE ANEURYSM LEFT RADIAL ARTERY;  Surgeon: Larina Earthly, MD;  Location: [facility] OR;  Service: Vascular;  Laterality: Left;   FEMORAL ARTERY STENT Left ~ 2014   "taken out of my leg; couldn' catorgorize what kind so the put it under all 3"; cataroziepitheloid hemanioendotheliomau   FOOT FRACTURE SURGERY Left 1973   FRACTURE SURGERY     INSERT /  REPLACE / REMOVE PACEMAKER  10/13/05   right side, medtronic Adapta   KNEE HARDWARE REMOVAL Right 1950's   "3-4 months after the insertion"   KNEE SURGERY Right 1950's   "broke my lower leg; had to put pin in my knee to keep lower leg in place til it healed"   LEFT HEART CATH AND CORS/GRAFTS ANGIOGRAPHY N/A 04/12/2017   Procedure: LEFT HEART CATH AND CORS/GRAFTS ANGIOGRAPHY;  Surgeon: Runell Gess, MD;  Location: [facility] INVASIVE CV LAB;  Service: Cardiovascular;  Laterality: N/A;   LEFT HEART CATH AND CORS/GRAFTS ANGIOGRAPHY N/A 02/06/2018   Procedure: LEFT HEART CATH AND CORS/GRAFTS ANGIOGRAPHY;  Surgeon: Lennette Bihari, MD;  Location: [facility] INVASIVE CV LAB;  Service: Cardiovascular;  Laterality: N/A;   LEFT HEART CATH AND CORS/GRAFTS ANGIOGRAPHY N/A 11/12/2018   Procedure: LEFT HEART CATH AND CORS/GRAFTS ANGIOGRAPHY;  Surgeon: Iran Ouch, MD;  Location: [facility] INVASIVE CV LAB;  Service: Cardiovascular;  Laterality: N/A;   LEFT HEART CATH AND CORS/GRAFTS ANGIOGRAPHY N/A 06/22/2020   Procedure: LEFT HEART CATH AND CORS/GRAFTS ANGIOGRAPHY;  Surgeon: Corky Crafts, MD;  Location: [facility] INVASIVE CV LAB;  Service: Cardiovascular;  Laterality: N/A;   PERIPHERAL VASCULAR CATHETERIZATION N/A 11/25/2015   Procedure: Lower Extremity Angiography;  Surgeon: Runell Gess, MD;  Location: [facility] INVASIVE CV LAB;  Service: Cardiovascular;  Laterality: N/A;   PERIPHERAL VASCULAR CATHETERIZATION Left 11/25/2015   Procedure: Peripheral Vascular Intervention;  Surgeon: Runell Gess, MD;  Location: [facility] INVASIVE CV LAB;  Service: Cardiovascular;  Laterality: Left;  external iliac   PERIPHERAL VASCULAR CATHETERIZATION N/A 01/03/2016   Procedure: Lower Extremity Angiography;  Surgeon: Runell Gess, MD;  Location: [facility] INVASIVE CV LAB;  Service: Cardiovascular;  Laterality: N/A;   PERIPHERAL VASCULAR CATHETERIZATION Left 01/03/2016   Procedure: Peripheral Vascular Intervention;  Surgeon: Runell Gess, MD;   Location: [facility] INVASIVE CV LAB;  Service: Cardiovascular;  Laterality: Left;  SFA   PERIPHERAL VASCULAR CATHETERIZATION Right 02/14/2016   Procedure: Peripheral Vascular Atherectomy;  Surgeon: Runell Gess, MD;  Location: [facility] INVASIVE CV LAB;  Service: Cardiovascular;  Laterality: Right;  SFA   POPLITEAL ARTERY STENT  01/03/2016   Contralateral access with a 7 [name] crossover sheath (second order catheter placement)   TONSILLECTOMY AND ADENOIDECTOMY     TUMOR EXCISION Right ~ 2005   cancerous tumor removed from shoulder   TUMOR EXCISION Right ~ 2000   benign tumor removed from under shoulder   TUMOR EXCISION Left 06/02/2010   resection of Lt SFA wth interposition of Gore-Tex graft    Allergies  Allergies  Allergen Reactions   Peanut-Containing Drug Products Anaphylaxis and Other (See Comments)    Tongue swelling is severe    Ace Inhibitors Cough        Diltiazem Hcl Other (See Comments)    UNSPECIFIED REACTION     Meloxicam Other (See Comments)    GI upset    Doxycycline Other (See Comments) and Cough    Reaction  of cough and runny nose   Eliquis [Apixaban] Other (See Comments)    dizziness   Sertraline Hcl Other (See Comments)   Carvedilol Itching   Clonidine Hcl Other (See Comments)    Patch only - skin irritation   Duloxetine Hcl Other (See Comments)    Urinary frequency    History of Present Illness    Robert Burns has a PMH of essential hypertension, sick sinus syndrome status post PPM, coronary artery disease, paroxysmal atrial fibrillation, stable angina, OSA on CPAP, type 2 diabetes, acute encephalopathy, hyperlipidemia, and chronic lymphocytic leukemia.  He has had multiple revascularization procedures, CABG 2004, DES to SVG-PDA and SVG-diagonal in 2016, DES x 2 to LAD and in-stent angioplasty for restenosis SVG-diagonal and proximal limb of SVG-OM 2019.  His PMH also includes PAD with angioplasty of left SFA 2004 and stent 9/17, right SFA 11/17.   Echocardiogram 02/07/2018 showed biatrial dilation.  Nuclear stress test 2018 showed normal perfusion and EF of 42%.  Had subsequent echo which showed EF 55%.  Underwent cardiac catheterization 8/20 with stenting to SVG-PDA and SVG-diagonal.  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  He underwent cardiac catheterization 06/22/2020.  He had generator change out in 2017.  CHA2DS2-VASc score 5 on Xarelto.  He presented to the emergency department (02/06/2022) with altered mental status and dizziness with intermittent confusion and pressured speech in addition to increased angina.  He was admitted.  His Xarelto was held.  He was placed on IV heparin.  His CT head was negative.  It was felt that he had a possible TIA.  His EKG at that time showed atrial paced and ventricular sensed rhythm with LBBB and no acute ST changes.  He contacted the nurse triage line on 07/19/2022.  He reported episodes of chest discomfort that would come and go over the past few weeks.  He presented to the clinic 08/30/22 for evaluation and stated he had been having intermittent chest discomfort.  He presented with his daughter.  He reported that he had been noticing chest discomfort at rest and with physical activity.  His episodes were 3 to 4 minutes and dissipated with rest.  He had used 1 dose of nitroglycerin that week which relieved his chest discomfort.  We reviewed his prior cardiac catheterization and echocardiograms.  They expressed understanding.  He reported that he had a mass removed from his back.  The area had become infected and continued to weep.  I  asked him to increase the iron in his diet and start the prescribed antibiotic.  It appeared that his angina was stable.  I will increased his Ranexa to 1000 mg twice daily order CBC, BMP and planned follow-up in 1 month.  His lab work showed slightly elevated sodium and chloride.  His creatinine was 0.99.  His hemoglobin was noted to be  slightly reduced at 10.2 and his hematocrit was 32.6.  He presents to the clinic today for follow-up evaluation and states he completed his antibiotics and his back wound is healing.  It is no longer weeping.  We reviewed his blood work from his previous visit.  He and his wife expressed understanding.  He is now exercising at the [facility] and feeling much better.  He has had 2 recent falls.  He was evaluated in the emergency department.  His blood pressure is well-controlled.  He denies exertional chest pain.  He does note chest discomfort occasionally with driving.  This appears to be  atypical versus musculoskeletal.  I will have him maintain his physical activity and current medication regimen we will plan follow-up in 4 to 6 months.  Today he denies chest pain, shortness of breath, lower extremity edema, fatigue, palpitations, melena, hematuria, hemoptysis, diaphoresis, weakness, presyncope, syncope, orthopnea, and PND.   Home Medications    Prior to Admission medications   Medication Sig Start Date End Date Taking? Authorizing Provider  amLODipine (NORVASC) 10 MG tablet Take 10 mg by mouth daily.    [provider]  atorvastatin (LIPITOR) 80 MG tablet Take 1 tablet (80 mg total) by mouth daily at 6 PM. 02/09/18   Janetta Hora, PA-C  clopidogrel (PLAVIX) 75 MG tablet Take 1 tablet (75 mg total) by mouth daily with breakfast. 06/24/20   Duke, Roe Rutherford, PA  dorzolamide-timolol (COSOPT) 22.3-6.8 MG/ML ophthalmic solution Place 1 drop into both eyes 2 (two) times daily. 04/30/20   [provider]  glipiZIDE (GLUCOTROL) 5 MG tablet Take 5 mg by mouth daily with breakfast.  01/10/18   [provider]  hydrALAZINE (APRESOLINE) 25 MG tablet Take 1 tablet (25 mg total) by mouth as needed (for BP >150 sys or 90 dia). Patient not taking: Reported on 01/09/2022 09/23/20   Ronney Asters, NP  hydrochlorothiazide (HYDRODIURIL) 25 MG tablet Take 25 mg by mouth every morning.     [provider]  isosorbide mononitrate (IMDUR) 60 MG 24 hr tablet TAKE 1 TABLET EVERY DAY 06/29/22   Runell Gess, MD  ketorolac (ACULAR) 0.4 % SOLN Place 1 drop into both eyes 4 (four) times daily. Patient not taking: Reported on 12/20/2021 06/01/20   [provider]  melatonin 5 MG TABS Take 5 mg by mouth at bedtime.    [provider]  metFORMIN (GLUCOPHAGE) 1000 MG tablet Take 1 tablet (1,000 mg total) by mouth 2 (two) times daily with a meal. Restart on 3/18. 06/23/20   Duke, Roe Rutherford, PA  metoprolol tartrate (LOPRESSOR) 100 MG tablet Take 1 tablet (100 mg total) by mouth 2 (two) times daily. 08/07/17   Jodelle Gross, NP  MULTIPLE VITAMIN-FOLIC ACID PO Take 1 tablet by mouth daily.    [provider]  nitroGLYCERIN (NITROSTAT) 0.4 MG SL tablet Place 1 tablet (0.4 mg total) under the tongue every 5 (five) minutes as needed for chest pain. 04/15/17   Creig Hines, NP  potassium chloride SA (K-DUR,KLOR-CON) 20 MEQ tablet Take 1 tablet (20 mEq total) by mouth daily. 02/18/16   Regalado, Belkys A, MD  ranolazine (RANEXA) 500 MG 12 hr tablet Take 500 mg by mouth 2 (two) times daily. 01/09/22   [provider]  rivaroxaban (XARELTO) 20 MG TABS tablet TAKE 1 TABLET BY MOUTH DAILY WITH SUPPER 01/10/22   Croitoru, Mihai, MD  tamsulosin (FLOMAX) 0.4 MG CAPS capsule Take 0.4 mg by mouth 2 (two) times daily.  03/09/15   [provider]    Family History    Family History  Problem Relation Age of Onset   Coronary artery disease Mother    Heart attack Mother    Hypertension Mother    Heart disease Mother        Open  Heart surgery   Diabetes Father    Heart disease Father    Hyperlipidemia Father    Heart attack Father    Hypertension Sister    Diabetes Sister    Heart disease Sister    He indicated that his mother is deceased. He  indicated that his father is deceased. He indicated that his sister is alive. He indicated  that his maternal grandmother is deceased. He indicated that his maternal grandfather is deceased. He indicated that his paternal grandmother is deceased. He indicated that his paternal grandfather is deceased.  Social History    Social History   Socioeconomic History   Marital status: Married    Spouse name: Not on file   Number of children: Not on file   Years of education: Not on file   Highest education level: Not on file  Occupational History   Not on file  Tobacco Use   Smoking status: Former    Types: Pipe    Quit date: 39    Years since quitting: 51.4   Smokeless tobacco: Never   Tobacco comments:    "quit smoking in 1973"  Vaping Use   Vaping Use: Never used  Substance and Sexual Activity   Alcohol use: No   Drug use: No   Sexual activity: Not Currently  Other Topics Concern   Not on file  Social History Narrative   Not on file   Social Determinants of Health   Financial Resource Strain: Not on file  Food Insecurity: Not on file  Transportation Needs: Not on file  Physical Activity: Not on file  Stress: Not on file  Social Connections: Not on file  Intimate Partner Violence: Not on file     Review of Systems    General:  No chills, fever, night sweats or weight changes.  Cardiovascular:  No chest pain, dyspnea on exertion, edema, orthopnea, palpitations, paroxysmal nocturnal dyspnea. Dermatological: No rash, lesions/masses Respiratory: No cough, dyspnea Urologic: No hematuria, dysuria Abdominal:   No nausea, vomiting, diarrhea, bright red blood per rectum, melena, or hematemesis Neurologic:  No visual changes, wkns, changes in mental status. All other systems reviewed and are otherwise negative except as noted above.  Physical Exam    VS:  BP 126/63 (BP Location: Right Arm, Patient Position: Sitting, Cuff Size: Normal)   Pulse 87   Ht 5\' 7"  (1.702 m)   Wt 171 lb 3.2 oz (77.7 kg)   SpO2 97%   BMI 26.81 kg/m  , BMI Body mass index is 26.81  kg/m. GEN: Well nourished, well developed, in no acute distress. HEENT: normal. Neck: Supple, no JVD, carotid bruits, or masses. Cardiac: RRR, no murmurs, rubs, or gallops. No clubbing, cyanosis, edema.  Radials/DP/PT 2+ and equal bilaterally.  Respiratory:  Respirations regular and unlabored, clear to auscultation bilaterally. GI: Soft, nontender, nondistended, BS + x 4. MS: no deformity or atrophy. Skin: warm and dry, no rash. Neuro:  Strength and sensation are intact. Psych: Normal affect.  Accessory Clinical Findings    Recent Labs: 05/17/2022: ALT 18 08/30/2022: BUN 25; Creatinine, Ser 0.99; Hemoglobin 10.2; Platelets 190; Potassium 4.6; Sodium 146   Recent Lipid Panel    Component Value Date/Time   CHOL 88 (L) 06/30/2021 1120   TRIG 71 06/30/2021 1120   HDL 35 (L) 06/30/2021 1120   CHOLHDL 2.5 06/30/2021 1120   CHOLHDL 2.6 06/23/2020 0325   VLDL 14 06/23/2020 0325   LDLCALC 38 06/30/2021 1120         ECG personally reviewed by me today-none today.  Echocardiogram 06/22/2020  IMPRESSIONS     1. Left ventricular ejection fraction, by estimation, is 55 to 60%. The  left ventricle has normal function. The left ventricle demonstrates  regional wall motion abnormalities (see scoring diagram/findings for  description).  There is mild concentric left  ventricular hypertrophy. Left ventricular diastolic parameters are  consistent with Grade II diastolic dysfunction (pseudonormalization).  Elevated left atrial pressure. There is mild hypokinesis of the left  ventricular, basal inferior wall.   2. Right ventricular systolic function is normal. The right ventricular  size is normal. There is normal pulmonary artery systolic pressure. The  estimated right ventricular systolic pressure is 30.1 mmHg.   3. Left atrial size was moderately dilated.   4. Right atrial size was mildly dilated.   5. The mitral valve is normal in structure. No evidence of mitral valve  regurgitation.  No evidence of mitral stenosis.   6. The aortic valve is normal in structure. Aortic valve regurgitation is  not visualized. No aortic stenosis is present.   7. The inferior vena cava is normal in size with greater than 50%  respiratory variability, suggesting right atrial pressure of 3 mmHg.   Comparison(s): No significant change from prior study. Prior images  reviewed side by side. The left ventricular function is unchanged. The  left ventricular wall motion abnormality is unchanged.   FINDINGS   Left Ventricle: Left ventricular ejection fraction, by estimation, is 55  to 60%. The left ventricle has normal function. The left ventricle  demonstrates regional wall motion abnormalities. Mild hypokinesis of the  left ventricular, basal inferior wall.  The left ventricular internal cavity size was normal in size. There is  mild concentric left ventricular hypertrophy. Abnormal (paradoxical)  septal motion consistent with post-operative status. Left ventricular  diastolic parameters are consistent with  Grade II diastolic dysfunction (pseudonormalization). Elevated left atrial  pressure.   Right Ventricle: The right ventricular size is normal. No increase in  right ventricular wall thickness. Right ventricular systolic function is  normal. There is normal pulmonary artery systolic pressure. The tricuspid  regurgitant velocity is 2.35 m/s, and   with an assumed right atrial pressure of 8 mmHg, the estimated right  ventricular systolic pressure is 30.1 mmHg.   Left Atrium: Left atrial size was moderately dilated.   Right Atrium: Right atrial size was mildly dilated.   Pericardium: There is no evidence of pericardial effusion.   Mitral Valve: The mitral valve is normal in structure. No evidence of  mitral valve regurgitation. No evidence of mitral valve stenosis. MV peak  gradient, 4.7 mmHg. The mean mitral valve gradient is 2.0 mmHg.   Tricuspid Valve: The tricuspid valve is normal  in structure. Tricuspid  valve regurgitation is not demonstrated. No evidence of tricuspid  stenosis.   Aortic Valve: The aortic valve is normal in structure. Aortic valve  regurgitation is not visualized. No aortic stenosis is present. Aortic  valve mean gradient measures 2.0 mmHg. Aortic valve peak gradient measures  4.1 mmHg. Aortic valve area, by VTI  measures 2.47 cm.   Pulmonic Valve: The pulmonic valve was normal in structure. Pulmonic valve  regurgitation is not visualized. No evidence of pulmonic stenosis.   Aorta: The aortic root is normal in size and structure.   Venous: The inferior vena cava is normal in size with greater than 50%  respiratory variability, suggesting right atrial pressure of 3 mmHg.   IAS/Shunts: No atrial level shunt detected by color flow Doppler.   Additional Comments: A device lead is visualized.    Nuclear stress test 11/12/2020  There was no ST segment deviation noted during stress. The left ventricular ejection fraction is moderately decreased (30-44%). Nuclear stress EF: 37%. Defect 1: There is a small  defect of mild severity present in the basal inferior and mid inferior location. Findings consistent with prior myocardial infarction with peri-infarct ischemia. This is an intermediate risk study.   1. Partially reversible basal to mid inferior perfusion defect, consistent with infarct with peri-infarct ischemia. 2. Intermediate risk study due to moderate systolic dysfunction (37%).  Amount of ischemia is small      Assessment & Plan   1.  Chest discomfort-has improved.  Now is less frequent.  He has not used any sublingual nitroglycerin.  Continues to be nonexertional and atypical in nature.  [facility] Continue current medical therapy No plans for ischemic evaluation at this time   Paroxysmal atrial fibrillation-Hr today 87 bpm.  On Xarelto, denies bleeding issues.  CHA2DS2-VASc  score 5. Continue Xarelto, metoprolol Avoid triggers caffeine, chocolate, EtOH, dehydration etc.  Coronary artery disease-compliant with medical therapy.  Has had multiple PCI and CABG. Continue Imdur, Plavix, atorvastatin, amlodipine, metoprolol Continue Ranexa to 1000 mg twice daily Low-sodium diet  Sick sinus syndrome, PPM-device interrogation 08/14/2022.  Not pacemaker dependent.  Battery status and leads appropriate.  Appropriate histograms.  No significant episodes of high ventricular rate or atrial mode switch. Follows with Dr. Royann Shivers  Essential hypertension-BP today 126/63 Continue amlodipine, hydralazine, HCTZ, Imdur, metoprolol Maintain blood pressure log Low-sodium diet  Disposition: Follow-up with Dr.Berry or me in 4 -6 months.   Thomasene Ripple. Namine Beahm NP-C     09/25/2022, 2:20 PM [facility] [address] Office [phone] Fax [phone]    I spent 14 minutes examining this patient, reviewing medications, and using patient centered shared decision making involving her cardiac care.  Prior to her visit I spent greater than 20 minutes reviewing her past medical history,  medications, and prior cardiac tests.

## 2022-09-25 ENCOUNTER — Ambulatory Visit: Payer: Medicare HMO | Attending: General Practice | Admitting: General Practice

## 2022-09-25 ENCOUNTER — Encounter: Payer: Self-pay | Admitting: General Practice

## 2022-09-25 VITALS — BP 126/63 | HR 87 | Ht 67.0 in | Wt 171.2 lb

## 2022-09-25 DIAGNOSIS — S20212A Contusion of left front wall of thorax, initial encounter: Secondary | ICD-10-CM | POA: Diagnosis not present

## 2022-09-25 DIAGNOSIS — I480 Paroxysmal atrial fibrillation: Secondary | ICD-10-CM | POA: Diagnosis not present

## 2022-09-25 DIAGNOSIS — I10 Essential (primary) hypertension: Secondary | ICD-10-CM | POA: Diagnosis not present

## 2022-09-25 DIAGNOSIS — I495 Sick sinus syndrome: Secondary | ICD-10-CM

## 2022-09-25 DIAGNOSIS — R0789 Other chest pain: Secondary | ICD-10-CM

## 2022-09-25 DIAGNOSIS — W19XXXA Unspecified fall, initial encounter: Secondary | ICD-10-CM | POA: Diagnosis not present

## 2022-09-25 DIAGNOSIS — M25522 Pain in left elbow: Secondary | ICD-10-CM | POA: Diagnosis not present

## 2022-09-25 DIAGNOSIS — Z950 Presence of cardiac pacemaker: Secondary | ICD-10-CM

## 2022-09-25 DIAGNOSIS — I25708 Atherosclerosis of coronary artery bypass graft(s), unspecified, with other forms of angina pectoris: Secondary | ICD-10-CM

## 2022-09-25 NOTE — Patient Instructions (Signed)
Medication Instructions:  The current medical regimen is effective;  continue present plan and medications as directed. Please refer to the Current Medication list given to you today.  *If you need a refill on your cardiac medications before your next appointment, please call your pharmacy*  Lab Work: NONE ordered at this time of appointment   If you have labs (blood work) drawn today and your tests are completely normal, you will receive your results only by: MyChart Message (if you have MyChart) OR A paper copy in the mail If you have any lab test that is abnormal or we need to change your treatment, we will call you to review the results.  Testing/Procedures: NONE ordered at this time of appointment    Other Instructions CONTINUE YOUR PHYSICAL ACTIVITY CONTINUE [facility]  MAKE SURE YOU USE YOUR CANE  Follow-Up: At [facility], you and your health needs are our priority.  As part of our continuing mission to provide you with exceptional heart care, we have created designated Provider Care Teams.  These Care Teams include your primary Cardiologist (physician) and Advanced Practice Providers (APPs -  Physician Assistants and Nurse Practitioners) who all work together to provide you with the care you need, when you need it.  Your next appointment:   4-6 month(s)  Provider:   Nanetta Batty, MD

## 2022-10-02 DIAGNOSIS — R0982 Postnasal drip: Secondary | ICD-10-CM | POA: Diagnosis not present

## 2022-10-02 DIAGNOSIS — R0781 Pleurodynia: Secondary | ICD-10-CM | POA: Diagnosis not present

## 2022-10-03 DIAGNOSIS — H34811 Central retinal vein occlusion, right eye, with macular edema: Secondary | ICD-10-CM | POA: Diagnosis not present

## 2022-10-03 DIAGNOSIS — H35352 Cystoid macular degeneration, left eye: Secondary | ICD-10-CM | POA: Diagnosis not present

## 2022-10-03 DIAGNOSIS — H40023 Open angle with borderline findings, high risk, bilateral: Secondary | ICD-10-CM | POA: Diagnosis not present

## 2022-10-03 DIAGNOSIS — H35372 Puckering of macula, left eye: Secondary | ICD-10-CM | POA: Diagnosis not present

## 2022-10-03 DIAGNOSIS — Z961 Presence of intraocular lens: Secondary | ICD-10-CM | POA: Diagnosis not present

## 2022-10-31 DIAGNOSIS — H35352 Cystoid macular degeneration, left eye: Secondary | ICD-10-CM | POA: Diagnosis not present

## 2022-10-31 DIAGNOSIS — H35372 Puckering of macula, left eye: Secondary | ICD-10-CM | POA: Diagnosis not present

## 2022-10-31 DIAGNOSIS — H34811 Central retinal vein occlusion, right eye, with macular edema: Secondary | ICD-10-CM | POA: Diagnosis not present

## 2022-11-06 DIAGNOSIS — R059 Cough, unspecified: Secondary | ICD-10-CM | POA: Diagnosis not present

## 2022-11-06 DIAGNOSIS — R195 Other fecal abnormalities: Secondary | ICD-10-CM | POA: Diagnosis not present

## 2022-11-14 ENCOUNTER — Ambulatory Visit (INDEPENDENT_AMBULATORY_CARE_PROVIDER_SITE_OTHER): Payer: Medicare HMO

## 2022-11-14 DIAGNOSIS — I495 Sick sinus syndrome: Secondary | ICD-10-CM

## 2022-11-15 ENCOUNTER — Other Ambulatory Visit: Payer: Self-pay | Admitting: Hematology and Oncology

## 2022-11-15 ENCOUNTER — Inpatient Hospital Stay: Payer: Medicare HMO | Attending: Hematology and Oncology

## 2022-11-15 ENCOUNTER — Other Ambulatory Visit: Payer: Self-pay

## 2022-11-15 ENCOUNTER — Inpatient Hospital Stay: Payer: Medicare HMO | Admitting: Hematology and Oncology

## 2022-11-15 VITALS — BP 149/67 | HR 66 | Temp 97.5°F | Resp 18 | Wt 170.6 lb

## 2022-11-15 DIAGNOSIS — C9110 Chronic lymphocytic leukemia of B-cell type not having achieved remission: Secondary | ICD-10-CM

## 2022-11-15 DIAGNOSIS — Z79899 Other long term (current) drug therapy: Secondary | ICD-10-CM | POA: Insufficient documentation

## 2022-11-15 DIAGNOSIS — D649 Anemia, unspecified: Secondary | ICD-10-CM | POA: Diagnosis not present

## 2022-11-15 DIAGNOSIS — Z87891 Personal history of nicotine dependence: Secondary | ICD-10-CM | POA: Diagnosis not present

## 2022-11-15 DIAGNOSIS — D500 Iron deficiency anemia secondary to blood loss (chronic): Secondary | ICD-10-CM | POA: Diagnosis not present

## 2022-11-15 LAB — CBC WITH DIFFERENTIAL (CANCER CENTER ONLY)
Abs Immature Granulocytes: 0.03 10*3/uL (ref 0.00–0.07)
Basophils Absolute: 0.1 10*3/uL (ref 0.0–0.1)
Basophils Relative: 0 %
Eosinophils Absolute: 0.1 10*3/uL (ref 0.0–0.5)
Eosinophils Relative: 1 %
HCT: 25.6 % — ABNORMAL LOW (ref 39.0–52.0)
Hemoglobin: 7.8 g/dL — ABNORMAL LOW (ref 13.0–17.0)
Immature Granulocytes: 0 %
Lymphocytes Relative: 76 %
Lymphs Abs: 13.2 10*3/uL — ABNORMAL HIGH (ref 0.7–4.0)
MCH: 23.2 pg — ABNORMAL LOW (ref 26.0–34.0)
MCHC: 30.5 g/dL (ref 30.0–36.0)
MCV: 76.2 fL — ABNORMAL LOW (ref 80.0–100.0)
Monocytes Absolute: 0.7 10*3/uL (ref 0.1–1.0)
Monocytes Relative: 4 %
Neutro Abs: 3.4 10*3/uL (ref 1.7–7.7)
Neutrophils Relative %: 19 %
Platelet Count: 135 10*3/uL — ABNORMAL LOW (ref 150–400)
RBC: 3.36 MIL/uL — ABNORMAL LOW (ref 4.22–5.81)
RDW: 16.6 % — ABNORMAL HIGH (ref 11.5–15.5)
WBC Count: 17.5 10*3/uL — ABNORMAL HIGH (ref 4.0–10.5)
nRBC: 0 % (ref 0.0–0.2)

## 2022-11-15 LAB — IRON AND IRON BINDING CAPACITY (CC-WL,HP ONLY)
Iron: 16 ug/dL — ABNORMAL LOW (ref 45–182)
Saturation Ratios: 3 % — ABNORMAL LOW (ref 17.9–39.5)
TIBC: 487 ug/dL — ABNORMAL HIGH (ref 250–450)
UIBC: 471 ug/dL — ABNORMAL HIGH (ref 117–376)

## 2022-11-15 LAB — CMP (CANCER CENTER ONLY)
ALT: 11 U/L (ref 0–44)
AST: 14 U/L — ABNORMAL LOW (ref 15–41)
Albumin: 4.2 g/dL (ref 3.5–5.0)
Alkaline Phosphatase: 51 U/L (ref 38–126)
Anion gap: 7 (ref 5–15)
BUN: 23 mg/dL (ref 8–23)
CO2: 26 mmol/L (ref 22–32)
Calcium: 9 mg/dL (ref 8.9–10.3)
Chloride: 108 mmol/L (ref 98–111)
Creatinine: 1 mg/dL (ref 0.61–1.24)
GFR, Estimated: >60 mL/min (ref >60)
Glucose, Bld: 293 mg/dL — ABNORMAL HIGH (ref 70–99)
Potassium: 4.2 mmol/L (ref 3.5–5.1)
Sodium: 141 mmol/L (ref 135–145)
Total Bilirubin: 0.5 mg/dL (ref 0.3–1.2)
Total Protein: 6.3 g/dL — ABNORMAL LOW (ref 6.5–8.1)

## 2022-11-15 LAB — RETIC PANEL
Immature Retic Fract: 14.2 % (ref 2.3–15.9)
RBC.: 3.48 MIL/uL — ABNORMAL LOW (ref 4.22–5.81)
Retic Count, Absolute: 54.3 10*3/uL (ref 19.0–186.0)
Retic Ct Pct: 1.6 % (ref 0.4–3.1)
Reticulocyte Hemoglobin: 18.4 pg — ABNORMAL LOW (ref >27.9)

## 2022-11-15 LAB — LACTATE DEHYDROGENASE: LDH: 130 U/L (ref 98–192)

## 2022-11-15 NOTE — Progress Notes (Signed)
Rechecked VS. BP and pulse have come back down to normal. Denies any chest pain at this time. Pt was ok to go home with daughter, Alvis Lemmings and pt's wife.

## 2022-11-15 NOTE — Progress Notes (Signed)
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 [name]: --WBC 18.6, Hgb 11.7, MCV 80.4, Plt 164, Lymph 12.40.  2)  08/24/2021: Establish care with [facility].  Flow cytometry results most consistent with CLL  Interval History:  Robert Burns 82 y.o. male with medical history significant for newly diagnosed CLL who presents for a follow up visit. The patient's last visit was on 05/17/2022. In the interim since the last visit he has had no major changes in his health.  On exam today Robert Burns reports he feels he is taking his nitroglycerin more often.  He notes that for some strange region he has the most chest discomfort and shortness of breath when he is sitting in his car.  Either he or someone else could be driving and this happens.  He notes he does not see any overt signs of bleeding though he reports his skin is thin.  A few months ago he did have a skin lesion removed from his back did have considerable bleeding from that lesion.  He notes it did take quite some time to heal.  He notes that he is not having any shortness of breath, lightheadedness, or dizziness but he feels like he is "falling apart".  His energy levels have been quite poor.  He notes that he sleeps a lot and can sleep up to 11 to 12 hours/day.  He notes that he is having issues with allergies.  Also of note about 3 to 4 weeks ago he did have some dark brown stools.  He otherwise denies any fevers, chills, sweats, nausea, vomiting or diarrhea.  Full 10 point ROS is  listed below.   MEDICAL HISTORY:  Past Medical History:  Diagnosis Date   Arthritis    "all over" (11/25/2015)   CAD (coronary artery disease)    a. s/p CABG  06/2002; b. 02/01/15 PCI: DES to prox SVG to PDA, staged PCI of SVG to Diag in 02/2015; c. 04/2017 Cath/PCI: LM nl, LAD 100ost, 54m, 75d, LCX 60ost, OM2 80, RCA 100ost, RPDA 80, LIMA->LAD ok, VG->D1 patent stent, VG->RPDA patent stent, 50p, VG->OM1->OM2 90p (3.0x24 Synergy DES), 100 between OM1->OM2 (med rx).   Cancer [facility])    Right Shoulder, Left Leg- BCC, SCC, AND MELANOMA   Epithelioid hemangioendothelioma    s/p resection of left SFA/mass with interposition 6 mm GoreTex graft 06/02/10, s/p repeat resection for positive margins 09/22/10   Hyperlipidemia    Hypertension    Leg pain    OSA on CPAP    PAD (peripheral artery disease) (HCC)    a. 10/2002 L SFA PTA/BMS; b. 8/17 LE Angio: LEIA 90 (9x40 self exp stent), LSFA short segment prox occlusion (staged PTA/stenting 01/03/2016), patent mid stent, RSFA 93m (staged PTA/DEB 02/14/2016).   Presence of permanent cardiac pacemaker    Medtronic   SSS (sick sinus syndrome) (HCC)    a. s/p PPM in 2007 with gen change 04/2015 - Medtronic Adapta ADDRL1, ser # [id number] H.   Type II diabetes mellitus (HCC)    Type II   Urgency of urination  SURGICAL HISTORY: Past Surgical History:  Procedure Laterality Date   CARDIAC CATHETERIZATION N/A 02/01/2015   Procedure: Left Heart Cath and Coronary Angiography;  Surgeon: Runell Gess, MD;  Location: [facility] INVASIVE CV LAB;  Service: Cardiovascular;  Laterality: N/A;   CARDIAC CATHETERIZATION N/A 02/01/2015   Procedure: Coronary Stent Intervention;  Surgeon: Runell Gess, MD;  Location: [facility] INVASIVE CV LAB;  Service: Cardiovascular;  Laterality: N/A;   CARDIAC CATHETERIZATION  06/2002   "just before bypass OR"   CARDIAC CATHETERIZATION N/A 03/01/2015   Procedure: Coronary Stent Intervention;  Surgeon: Runell Gess, MD;  Location: [facility] INVASIVE  CV LAB;  Service: Cardiovascular;  Laterality: N/A;   CARDIAC CATHETERIZATION  02/06/2018   COLONOSCOPY     CORONARY ANGIOPLASTY     CORONARY ARTERY BYPASS GRAFT  06/2002   x5, LIMA-LAD;VG- Diag; seq VG- ramus & OM branch; VG-PDA   CORONARY STENT INTERVENTION N/A 04/12/2017   Procedure: CORONARY STENT INTERVENTION;  Surgeon: Runell Gess, MD;  Location: [facility] INVASIVE CV LAB;  Service: Cardiovascular;  Laterality: N/A;   CORONARY STENT INTERVENTION N/A 02/08/2018   Procedure: CORONARY STENT INTERVENTION;  Surgeon: Corky Crafts, MD;  Location: [facility] INVASIVE CV LAB;  Service: Cardiovascular;  Laterality: N/A;   CORONARY STENT INTERVENTION N/A 11/12/2018   Procedure: CORONARY STENT INTERVENTION;  Surgeon: Iran Ouch, MD;  Location: [facility] INVASIVE CV LAB;  Service: Cardiovascular;  Laterality: N/A;   CORONARY STENT INTERVENTION N/A 06/22/2020   Procedure: CORONARY STENT INTERVENTION;  Surgeon: Corky Crafts, MD;  Location: [facility] INVASIVE CV LAB;  Service: Cardiovascular;  Laterality: N/A;   EP IMPLANTABLE DEVICE N/A 04/23/2015   Procedure: PPM Generator Changeout;  Surgeon: Thurmon Fair, MD;  Location: [facility] INVASIVE CV LAB;  Service: Cardiovascular;  Laterality: N/A;   FALSE ANEURYSM REPAIR Left 11/29/2018   Procedure: REPAIR FALSE ANEURYSM LEFT RADIAL ARTERY;  Surgeon: Larina Earthly, MD;  Location: [facility] OR;  Service: Vascular;  Laterality: Left;   FEMORAL ARTERY STENT Left ~ 2014   "taken out of my leg; couldn' catorgorize what kind so the put it under all 3"; cataroziepitheloid hemanioendotheliomau   FOOT FRACTURE SURGERY Left 1973   FRACTURE SURGERY     INSERT / REPLACE / REMOVE PACEMAKER  10/13/05   right side, medtronic Adapta   KNEE HARDWARE REMOVAL Right 1950's   "3-4 months after the insertion"   KNEE SURGERY Right 1950's   "broke my lower leg; had to put pin in my knee to keep lower leg in place til it healed"   LEFT HEART CATH AND CORS/GRAFTS ANGIOGRAPHY N/A 04/12/2017    Procedure: LEFT HEART CATH AND CORS/GRAFTS ANGIOGRAPHY;  Surgeon: Runell Gess, MD;  Location: [facility] INVASIVE CV LAB;  Service: Cardiovascular;  Laterality: N/A;   LEFT HEART CATH AND CORS/GRAFTS ANGIOGRAPHY N/A 02/06/2018   Procedure: LEFT HEART CATH AND CORS/GRAFTS ANGIOGRAPHY;  Surgeon: Lennette Bihari, MD;  Location: [facility] INVASIVE CV LAB;  Service: Cardiovascular;  Laterality: N/A;   LEFT HEART CATH AND CORS/GRAFTS ANGIOGRAPHY N/A 11/12/2018   Procedure: LEFT HEART CATH AND CORS/GRAFTS ANGIOGRAPHY;  Surgeon: Iran Ouch, MD;  Location: [facility] INVASIVE CV LAB;  Service: Cardiovascular;  Laterality: N/A;   LEFT HEART CATH AND CORS/GRAFTS ANGIOGRAPHY N/A 06/22/2020   Procedure: LEFT HEART CATH AND CORS/GRAFTS ANGIOGRAPHY;  Surgeon: Corky Crafts, MD;  Location: [facility] INVASIVE CV LAB;  Service: Cardiovascular;  Laterality: N/A;   PERIPHERAL VASCULAR CATHETERIZATION N/A 11/25/2015   Procedure: Lower Extremity Angiography;  Surgeon: Runell Gess, MD;  Location: [facility] INVASIVE CV LAB;  Service: Cardiovascular;  Laterality: N/A;   PERIPHERAL VASCULAR CATHETERIZATION Left 11/25/2015   Procedure: Peripheral Vascular Intervention;  Surgeon: Runell Gess, MD;  Location: [facility] INVASIVE CV LAB;  Service: Cardiovascular;  Laterality: Left;  external iliac   PERIPHERAL VASCULAR CATHETERIZATION N/A 01/03/2016   Procedure: Lower Extremity Angiography;  Surgeon: Runell Gess, MD;  Location: [facility] INVASIVE CV LAB;  Service: Cardiovascular;  Laterality: N/A;   PERIPHERAL VASCULAR CATHETERIZATION Left 01/03/2016   Procedure: Peripheral Vascular Intervention;  Surgeon: Runell Gess, MD;  Location: [facility] INVASIVE CV LAB;  Service: Cardiovascular;  Laterality: Left;  SFA   PERIPHERAL VASCULAR CATHETERIZATION Right 02/14/2016   Procedure: Peripheral Vascular Atherectomy;  Surgeon: Runell Gess, MD;  Location: [facility] INVASIVE CV LAB;  Service: Cardiovascular;  Laterality: Right;  SFA   POPLITEAL ARTERY STENT  01/03/2016    Contralateral access with a 7 [name] crossover sheath (second order catheter placement)   TONSILLECTOMY AND ADENOIDECTOMY     TUMOR EXCISION Right ~ 2005   cancerous tumor removed from shoulder   TUMOR EXCISION Right ~ 2000   benign tumor removed from under shoulder   TUMOR EXCISION Left 06/02/2010   resection of Lt SFA wth interposition of Gore-Tex graft    SOCIAL HISTORY: Social History   Socioeconomic History   Marital status: Married    Spouse name: Not on file   Number of children: Not on file   Years of education: Not on file   Highest education level: Not on file  Occupational History   Not on file  Tobacco Use   Smoking status: Former    Types: Pipe    Quit date: 69    Years since quitting: 51.6   Smokeless tobacco: Never   Tobacco comments:    "quit smoking in 1973"  Vaping Use   Vaping status: Never Used  Substance and Sexual Activity   Alcohol use: No   Drug use: No   Sexual activity: Not Currently  Other Topics Concern   Not on file  Social History Narrative   Not on file   Social Determinants of Health   Financial Resource Strain: Not on file  Food Insecurity: No Food Insecurity (09/07/2021)   Received from [facility]   Hunger Vital Sign    Worried About Running Out of Food in the Last Year: Never true    Ran Out of Food in the Last Year: Never true  Transportation Needs: Not on file  Physical Activity: Not on file  Stress: Not on file  Social Connections: Unknown (08/18/2021)   Received from [facility]   Social Network    Social Network: Not on file  Intimate Partner Violence: Not At Risk (09/10/2022)   Received from [name]   HITS    Over the last 12 months how often did your partner physically hurt you?: 1    Over the last 12 months how often did your partner insult you or talk down to you?: 1    Over the last 12 months how often did your partner threaten you with physical harm?: 1     Over the last 12 months how often did your partner scream or curse at you?: 1    FAMILY HISTORY: Family History  Problem Relation Age of Onset   Coronary artery disease Mother    Heart attack Mother    Hypertension Mother  Heart disease Mother        Open  Heart surgery   Diabetes Father    Heart disease Father    Hyperlipidemia Father    Heart attack Father    Hypertension Sister    Diabetes Sister    Heart disease Sister     ALLERGIES:  is allergic to peanut-containing drug products, ace inhibitors, diltiazem hcl, meloxicam, doxycycline, eliquis [apixaban], sertraline hcl, carvedilol, clonidine hcl, and duloxetine hcl.  MEDICATIONS:  Current Outpatient Medications  Medication Sig Dispense Refill   amLODipine (NORVASC) 10 MG tablet Take 10 mg by mouth daily.     atorvastatin (LIPITOR) 80 MG tablet Take 1 tablet (80 mg total) by mouth daily at 6 PM. 90 tablet 1   clopidogrel (PLAVIX) 75 MG tablet Take 1 tablet (75 mg total) by mouth daily with breakfast. 90 tablet 3   dorzolamide-timolol (COSOPT) 22.3-6.8 MG/ML ophthalmic solution Place 1 drop into both eyes 2 (two) times daily.     glipiZIDE (GLUCOTROL) 5 MG tablet Take 5 mg by mouth daily with breakfast.      hydrALAZINE (APRESOLINE) 25 MG tablet Take 1 tablet (25 mg total) by mouth as needed (for BP >150 sys or 90 dia).     hydrochlorothiazide (HYDRODIURIL) 25 MG tablet Take 25 mg by mouth every morning.     isosorbide mononitrate (IMDUR) 60 MG 24 hr tablet TAKE 1 TABLET EVERY DAY 90 tablet 3   ketorolac (ACULAR) 0.4 % SOLN Place 1 drop into both eyes 4 (four) times daily.     linezolid (ZYVOX) 600 MG tablet Take 600 mg by mouth 2 (two) times daily.     melatonin 5 MG TABS Take 5 mg by mouth at bedtime.     metFORMIN (GLUCOPHAGE) 1000 MG tablet Take 1 tablet (1,000 mg total) by mouth 2 (two) times daily with a meal. Restart on 3/18.     metoprolol tartrate (LOPRESSOR) 100 MG tablet Take 1 tablet (100 mg total) by mouth 2  (two) times daily. 180 tablet 3   MULTIPLE VITAMIN-FOLIC ACID PO Take 1 tablet by mouth daily.     mupirocin ointment (BACTROBAN) 2 % Apply 1 Application topically 2 (two) times daily.     nitroGLYCERIN (NITROSTAT) 0.4 MG SL tablet Place 1 tablet (0.4 mg total) under the tongue every 5 (five) minutes as needed for chest pain. 25 tablet 3   potassium chloride SA (K-DUR,KLOR-CON) 20 MEQ tablet Take 1 tablet (20 mEq total) by mouth daily. 15 tablet 0   ranolazine (RANEXA) 1000 MG SR tablet Take 1 tablet (1,000 mg total) by mouth 2 (two) times daily. 60 tablet 5   tamsulosin (FLOMAX) 0.4 MG CAPS capsule Take 0.4 mg by mouth 2 (two) times daily.      XARELTO 20 MG TABS tablet TAKE 1 TABLET EVERY DAY WITH SUPPER 90 tablet 3   No current facility-administered medications for this visit.    REVIEW OF SYSTEMS:   Constitutional: ( - ) fevers, ( - )  chills , ( - ) night sweats Eyes: ( - ) blurriness of vision, ( - ) double vision, ( - ) watery eyes Ears, nose, mouth, throat, and face: ( - ) mucositis, ( - ) sore throat Respiratory: ( - ) cough, ( - ) dyspnea, ( - ) wheezes Cardiovascular: ( - ) palpitation, ( - ) chest discomfort, ( - ) lower extremity swelling Gastrointestinal:  ( - ) nausea, ( - ) heartburn, ( - ) change in bowel  habits Skin: ( - ) abnormal skin rashes Lymphatics: ( - ) new lymphadenopathy, ( - ) easy bruising Neurological: ( - ) numbness, ( - ) tingling, ( - ) new weaknesses Behavioral/Psych: ( - ) mood change, ( - ) new changes  All other systems were reviewed with the patient and are negative.  PHYSICAL EXAMINATION: ECOG PERFORMANCE STATUS: 0 - Asymptomatic  Vitals:   11/15/22 1432 11/15/22 1527  BP: (!) 193/89 (!) 149/67  Pulse: (!) 104 66  Resp: 17 18  Temp: (!) 97.5 F (36.4 C)   SpO2: 100%     Filed Weights   11/15/22 1432  Weight: 170 lb 9.6 oz (77.4 kg)     GENERAL: Well-appearing elderly Caucasian male, alert, no distress and comfortable SKIN: skin  color, texture, turgor are normal, no rashes or significant lesions EYES: conjunctiva are pink and non-injected, sclera clear NECK: supple, non-tender LYMPH:  no palpable lymphadenopathy in the cervical, axillary or inguinal LUNGS: clear to auscultation and percussion with normal breathing effort HEART: regular rate & rhythm and no murmurs and no lower extremity edema ABDOMEN: No evidence of hepatosplenomegaly.  Soft, non-tender, non-distended, normal bowel sounds Musculoskeletal: no cyanosis of digits and no clubbing  PSYCH: alert & oriented x 3, fluent speech NEURO: no focal motor/sensory deficits  LABORATORY DATA:  I have reviewed the data as listed    Latest Ref Rng & Units 11/15/2022    2:19 PM 08/30/2022    2:30 PM 05/17/2022    2:33 PM  CBC  WBC 4.0 - 10.5 K/uL 17.5  17.9  20.0   Hemoglobin 13.0 - 17.0 g/dL 7.8  62.1  30.8   Hematocrit 39.0 - 52.0 % 25.6  32.6  35.1   Platelets 150 - 400 K/uL 135  190  165        Latest Ref Rng & Units 11/15/2022    2:19 PM 08/30/2022    2:30 PM 05/17/2022    2:33 PM  CMP  Glucose 70 - 99 mg/dL 657  846  962   BUN 8 - 23 mg/dL 23  25  24    Creatinine 0.61 - 1.24 mg/dL 9.52  8.41  3.24   Sodium 135 - 145 mmol/L 141  146  142   Potassium 3.5 - 5.1 mmol/L 4.2  4.6  3.9   Chloride 98 - 111 mmol/L 108  107  105   CO2 22 - 32 mmol/L 26  20  28    Calcium 8.9 - 10.3 mg/dL 9.0  9.8  9.4   Total Protein 6.5 - 8.1 g/dL 6.3   7.3   Total Bilirubin 0.3 - 1.2 mg/dL 0.5   0.8   Alkaline Phos 38 - 126 U/L 51   58   AST 15 - 41 U/L 14   21   ALT 0 - 44 U/L 11   18     RADIOGRAPHIC STUDIES: CUP PACEART REMOTE DEVICE CHECK  Result Date: 11/15/2022 Scheduled remote reviewed. Normal device function.  3 AHR episodes on 09/10/22 between 9:32 pm and 9:46 pm, longest 9 min 59 sec, hx of PAF and on Xarelto per Epic. Next remote 91 days. [facility], [facility].   ASSESSMENT & PLAN Robert Burns 82 y.o. male with medical history significant for newly diagnosed CLL who  presents for a follow up visit.  Previously we discussed the diagnosis of CLL and treatment options moving forward.  We discussed that this is a chronic condition with no definitive cure.  Additionally we discussed that treatment is reserved until certain criteria are met.  Typically treatment is started with rapid increase in lymphocytes, massively enlarged lymph nodes, anemia, or thrombocytopenia.  The patient currently does not meet any criteria necessary to start treatment.  As such I would recommend continued close observation of his blood counts and symptoms.  The patient knows to call the clinic with any issues involving fevers, chills, sweats, sudden weight loss, or rapidly enlarging lymph node.  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   --Prognostic panel showed del 13 with positive ZAP 70 and negative IgVH --Labs today show white blood cell count 17.5, Hgb 7.8, MCV 76.2, Plt 135 --No indication for imaging at this time --No criteria met for beginning treatment.  Would recommend continued monitoring --Return to clinic in 6 months time for further evaluation.  # Anemia -- Appears microcytic, concern for iron deficiency anemia as the etiology. -- Added on iron panel, ferritin, and reticulocyte panel today -- We will plan to proceed with IV iron therapy if he is found to be genuinely iron deficient. --Would recommend prompt referral to GI if he was found to be iron deficient.  No colonoscopy on file, unsure who performed his last colonoscopy. -- Return to clinic 4 to 6 weeks after his last dose of IV iron therapy.  Orders Placed This Encounter  Procedures   Retic Panel    Standing Status:   Future    Number of Occurrences:   1    Standing Expiration Date:   11/15/2023   Ferritin    Standing Status:   Future    Number of Occurrences:   1    Standing Expiration Date:   11/15/2023   Iron and Iron Binding Capacity ([facility] only)    Standing  Status:   Future    Number of Occurrences:   1    Standing Expiration Date:   11/15/2023    All questions were answered. The patient knows to call the clinic with any problems, questions or concerns.  A total of more than 30 minutes were spent on this encounter with face-to-face time and non-face-to-face time, including preparing to see the patient, ordering tests and/or medications, counseling the patient and coordination of care as outlined above.   Ulysees Barns, MD Department of Hematology/Oncology [facility] Phone: [phone] Pager: [phone] Email: [email or web address]  11/15/2022 5:00 PM  Mathis Fare BD, Catovsky D, Caligaris-Cappio F, Dighiero G, Dhner H, [place], [place], [place] E, Chiorazzi N, [place], Rai KR, [place], Eichhorst B, O'Brien S, Robak T, Seymour JF, Kipps TJ. iwCLL guidelines for diagnosis, indications for treatment, response assessment, and supportive management of CLL. Blood. 2018 Jun 21;131(25):2745-2760.  Active disease should be clearly documented to initiate therapy. At least 1 of the following criteria should be met.  1) Evidence of progressive marrow failure as manifested by the development of, or worsening of, anemia and/or thrombocytopenia. Cutoff levels of Hb <10 g/dL or platelet counts <696  109/L are generally regarded as indication for treatment. However, in some patients, platelet counts <100  109/L may remain stable over a long period; this situation does not automatically require therapeutic intervention. 2) Massive (ie, ?6 cm below the left costal margin) or progressive or symptomatic splenomegaly. 3) Massive nodes (ie, ?10 cm in longest diameter) or progressive or symptomatic lymphadenopathy. 4) Progressive lymphocytosis with an increase of ?  50% over a 14-month period, or lymphocyte doubling time (LDT) <6 months. LDT can be obtained by linear regression extrapolation of absolute  lymphocyte counts obtained at intervals of 2 weeks over an observation period of 2 to 3 months; patients with initial blood lymphocyte counts <30  109/L may require a longer observation period to determine the LDT. Factors contributing to lymphocytosis other than CLL (eg, infections, steroid administration) should be excluded. 5) Autoimmune complications including anemia or thrombocytopenia poorly responsive to corticosteroids. 6) Symptomatic or functional extranodal involvement (eg, skin, kidney, lung, spine). Disease-related symptoms as defined by any of the following: Unintentional weight loss ?10% within the previous 6 months. Significant fatigue (ie, ECOG performance scale 2 or worse; cannot work or unable to perform usual activities). Fevers ?100.27F or 38.0C for 2 or more weeks without evidence of infection. Night sweats for ?1 month without evidence of infection.

## 2022-11-16 ENCOUNTER — Telehealth: Payer: Self-pay | Admitting: Pharmacy Technician

## 2022-11-16 ENCOUNTER — Telehealth: Payer: Self-pay | Admitting: *Deleted

## 2022-11-16 ENCOUNTER — Other Ambulatory Visit: Payer: Self-pay

## 2022-11-16 ENCOUNTER — Telehealth: Payer: Self-pay | Admitting: Hematology and Oncology

## 2022-11-16 NOTE — Telephone Encounter (Signed)
Dr. Leonides Schanz,  Monoferric is non preferred and will likely be denied.  Preferred medication is Venofer. Would you like to try venofer?  Auth Submission: NO AUTH NEEDED Site of care: Site of care: [facility] Payer: [employer] medicare Medication & CPT/J Code(s) submitted: Venofer (Iron Sucrose) J1756 Route of submission (phone, fax, portal):  Phone # Fax # Auth type: Buy/Bill PB Units/visits requested:  Reference number:  Approval from: 11/16/22 to 02/16/23

## 2022-11-16 NOTE — Telephone Encounter (Signed)
TCT pt's daughter, Goerge Loboda.  Spoke with her. Advised that his findings are concerning for iron deficiency anemia. He has an iron sat of 3%. IDr. Leonides Schanz has ordered IV iron therapy as soon as is feasible. I would also recommend that if he has a gastroenterologist he meets with him ASAP. The patient does not have a GI doctor we will make a referral to [facility] to be promptly evaluated for GI bleed. [name] states her father does not has a GI doctor. Advised that we will refer him to [facility]. [name] voiced understanding and is aware of future appts.

## 2022-11-16 NOTE — Telephone Encounter (Signed)
-----   Message from Ulysees Barns IV sent at 11/15/2022  5:00 PM EDT ----- Please let Robert Burns's daughter know that his findings are concerning for iron deficiency anemia.  He has an iron sat of 3%.  I will try to schedule IV iron therapy as soon as is feasible.  I would also recommend that if he has a gastroenterologist he meets with him ASAP.  The patient does not have a GI doctor we will make a referral to [facility] to be promptly evaluated for GI bleed. ----- Message ----- From: Leory Plowman, Lab In Gas Sent: 11/15/2022   3:50 PM EDT To: Jaci Standard, MD

## 2022-11-20 ENCOUNTER — Telehealth: Payer: Self-pay | Admitting: Cardiovascular Disease

## 2022-11-20 ENCOUNTER — Encounter: Payer: Self-pay | Admitting: Internal Medicine

## 2022-11-20 ENCOUNTER — Ambulatory Visit (INDEPENDENT_AMBULATORY_CARE_PROVIDER_SITE_OTHER): Payer: Medicare HMO

## 2022-11-20 VITALS — BP 140/56 | HR 63 | Temp 97.8°F | Resp 16 | Ht 67.0 in | Wt 168.6 lb

## 2022-11-20 DIAGNOSIS — D509 Iron deficiency anemia, unspecified: Secondary | ICD-10-CM

## 2022-11-20 DIAGNOSIS — D500 Iron deficiency anemia secondary to blood loss (chronic): Secondary | ICD-10-CM

## 2022-11-20 MED ORDER — DIPHENHYDRAMINE HCL 25 MG PO CAPS
25.0000 mg | ORAL_CAPSULE | Freq: Once | ORAL | Status: AC
  Administered 2022-11-20: 25 mg via ORAL
  Filled 2022-11-20: qty 1

## 2022-11-20 MED ORDER — ACETAMINOPHEN 325 MG PO TABS
650.0000 mg | ORAL_TABLET | Freq: Once | ORAL | Status: AC
  Administered 2022-11-20: 650 mg via ORAL
  Filled 2022-11-20: qty 2

## 2022-11-20 MED ORDER — SODIUM CHLORIDE 0.9 % IV SOLN
200.0000 mg | Freq: Once | INTRAVENOUS | Status: AC
  Administered 2022-11-20: 200 mg via INTRAVENOUS
  Filled 2022-11-20: qty 10

## 2022-11-20 NOTE — Progress Notes (Unsigned)
Cardiology Clinic Note   Patient Name: Robert Burns Date of Encounter: 11/21/2022  Primary Care Provider:  [name], MD (Inactive) Primary Cardiologist:  Nanetta Batty, MD  Patient Profile    82 year old male with history of hypertension, coronary artery disease, status post CABG in 2004, (LIMA to LAD, SVG to diagonal 1 SVG to RPDA, SVG to OM1 and OM 2, PCI with drug-eluting stent to the proximal SVG and PDA in 2016, staged PCI of the SVG to diagonal and November 2016, PAF on Xarelto, CHA2DS2-VASc 4 of 5, hyperlipidemia, OSA on CPAP, PAD status post left SFA PTA/bare-metal stent in 2004, L EIA stent in 2017, L SFA short segment occlusion with staged PTA and stenting in 2017, pacemaker in situ (Medtronic Adapta L ADD RL 1) in the setting of sick sinus syndrome, type 2 diabetes.  Possible TIA October 2023 after a visit to the ED with altered mental status.  CT scan was negative.  Past Medical History    Past Medical History:  Diagnosis Date   Arthritis    "all over" (11/25/2015)   CAD (coronary artery disease)    a. s/p CABG  06/2002; b. 02/01/15 PCI: DES to prox SVG to PDA, staged PCI of SVG to Diag in 02/2015; c. 04/2017 Cath/PCI: LM nl, LAD 100ost, 54m, 75d, LCX 60ost, OM2 80, RCA 100ost, RPDA 80, LIMA->LAD ok, VG->D1 patent stent, VG->RPDA patent stent, 50p, VG->OM1->OM2 90p (3.0x24 Synergy DES), 100 between OM1->OM2 (med rx).   Cancer [facility])    Right Shoulder, Left Leg- BCC, SCC, AND MELANOMA   Epithelioid hemangioendothelioma    s/p resection of left SFA/mass with interposition 6 mm GoreTex graft 06/02/10, s/p repeat resection for positive margins 09/22/10   Hyperlipidemia    Hypertension    Leg pain    OSA on CPAP    PAD (peripheral artery disease) (HCC)    a. 10/2002 L SFA PTA/BMS; b. 8/17 LE Angio: LEIA 90 (9x40 self exp stent), LSFA short segment prox occlusion (staged PTA/stenting 01/03/2016), patent mid stent, RSFA 76m (staged PTA/DEB 02/14/2016).   Presence of permanent  cardiac pacemaker    Medtronic   SSS (sick sinus syndrome) (HCC)    a. s/p PPM in 2007 with gen change 04/2015 - Medtronic Adapta ADDRL1, ser # [id number].   Type II diabetes mellitus (HCC)    Type II   Urgency of urination    Past Surgical History:  Procedure Laterality Date   CARDIAC CATHETERIZATION N/A 02/01/2015   Procedure: Left Heart Cath and Coronary Angiography;  Surgeon: Runell Gess, MD;  Location: [facility] INVAS[facility] CV LAB;  Service: Cardiovascular;  Laterality: N/A;   CARDIAC CATHETERIZATION N/A 02/01/2015   Procedure: Coronary Stent Intervention;  Surgeon: Runell Gess, MD;  Location: [facility] INVASIVE CV LAB;  Service: Cardiovascular;  Laterality: N/A;   CARDIAC CATHETERIZATION  06/2002   "just before bypass OR"   CARDIAC CATHETERIZATION N/A 03/01/2015   Procedure: Coronary Stent Intervention;  Surgeon: Runell Gess, MD;  Location: [facility] INVASIVE CV LAB;  Service: Cardiovascular;  Laterality: N/A;   CARDIAC CATHETERIZATION  02/06/2018   COLONOSCOPY     CORONARY ANGIOPLASTY     CORONARY ARTERY BYPASS GRAFT  06/2002   x5, LIMA-LAD;VG- Diag; seq VG- ramus & OM branch; VG-PDA   CORONARY STENT INTERVENTION N/A 04/12/2017   Procedure: CORONARY STENT INTERVENTION;  Surgeon: Runell Gess, MD;  Location: [facility] INVASIVE CV LAB;  Service: Cardiovascular;  Laterality: N/A;   CORONARY STENT INTERVENTION N/A 02/08/2018  Procedure: CORONARY STENT INTERVENTION;  Surgeon: Corky Crafts, MD;  Location: [facility] INVASIVE CV LAB;  Service: Cardiovascular;  Laterality: N/A;   CORONARY STENT INTERVENTION N/A 11/12/2018   Procedure: CORONARY STENT INTERVENTION;  Surgeon: Iran Ouch, MD;  Location: [facility] INVASIVE CV LAB;  Service: Cardiovascular;  Laterality: N/A;   CORONARY STENT INTERVENTION N/A 06/22/2020   Procedure: CORONARY STENT INTERVENTION;  Surgeon: Corky Crafts, MD;  Location: [facility] INVASIVE CV LAB;  Service: Cardiovascular;  Laterality: N/A;   EP IMPLANTABLE DEVICE N/A 04/23/2015    Procedure: PPM Generator Changeout;  Surgeon: Thurmon Fair, MD;  Location: [facility] INVASIVE CV LAB;  Service: Cardiovascular;  Laterality: N/A;   FALSE ANEURYSM REPAIR Left 11/29/2018   Procedure: REPAIR FALSE ANEURYSM LEFT RADIAL ARTERY;  Surgeon: Larina Earthly, MD;  Location: [facility] OR;  Service: Vascular;  Laterality: Left;   FEMORAL ARTERY STENT Left ~ 2014   "taken out of my leg; couldn' catorgorize what kind so the put it under all 3"; cataroziepitheloid hemanioendotheliomau   FOOT FRACTURE SURGERY Left 1973   FRACTURE SURGERY     INSERT / REPLACE / REMOVE PACEMAKER  10/13/05   right side, medtronic Adapta   KNEE HARDWARE REMOVAL Right 1950's   "3-4 months after the insertion"   KNEE SURGERY Right 1950's   "broke my lower leg; had to put pin in my knee to keep lower leg in place til it healed"   LEFT HEART CATH AND CORS/GRAFTS ANGIOGRAPHY N/A 04/12/2017   Procedure: LEFT HEART CATH AND CORS/GRAFTS ANGIOGRAPHY;  Surgeon: Runell Gess, MD;  Location: [facility] INVASIVE CV LAB;  Service: Cardiovascular;  Laterality: N/A;   LEFT HEART CATH AND CORS/GRAFTS ANGIOGRAPHY N/A 02/06/2018   Procedure: LEFT HEART CATH AND CORS/GRAFTS ANGIOGRAPHY;  Surgeon: Lennette Bihari, MD;  Location: [facility] INVASIVE CV LAB;  Service: Cardiovascular;  Laterality: N/A;   LEFT HEART CATH AND CORS/GRAFTS ANGIOGRAPHY N/A 11/12/2018   Procedure: LEFT HEART CATH AND CORS/GRAFTS ANGIOGRAPHY;  Surgeon: Iran Ouch, MD;  Location: [facility] INVASIVE CV LAB;  Service: Cardiovascular;  Laterality: N/A;   LEFT HEART CATH AND CORS/GRAFTS ANGIOGRAPHY N/A 06/22/2020   Procedure: LEFT HEART CATH AND CORS/GRAFTS ANGIOGRAPHY;  Surgeon: Corky Crafts, MD;  Location: [facility] INVASIVE CV LAB;  Service: Cardiovascular;  Laterality: N/A;   PERIPHERAL VASCULAR CATHETERIZATION N/A 11/25/2015   Procedure: Lower Extremity Angiography;  Surgeon: Runell Gess, MD;  Location: [facility] INVASIVE CV LAB;  Service: Cardiovascular;  Laterality: N/A;   PERIPHERAL  VASCULAR CATHETERIZATION Left 11/25/2015   Procedure: Peripheral Vascular Intervention;  Surgeon: Runell Gess, MD;  Location: [facility] INVASIVE CV LAB;  Service: Cardiovascular;  Laterality: Left;  external iliac   PERIPHERAL VASCULAR CATHETERIZATION N/A 01/03/2016   Procedure: Lower Extremity Angiography;  Surgeon: Runell Gess, MD;  Location: [facility] INVASIVE CV LAB;  Service: Cardiovascular;  Laterality: N/A;   PERIPHERAL VASCULAR CATHETERIZATION Left 01/03/2016   Procedure: Peripheral Vascular Intervention;  Surgeon: Runell Gess, MD;  Location: [facility] INVASIVE CV LAB;  Service: Cardiovascular;  Laterality: Left;  SFA   PERIPHERAL VASCULAR CATHETERIZATION Right 02/14/2016   Procedure: Peripheral Vascular Atherectomy;  Surgeon: Runell Gess, MD;  Location: [facility] INVASIVE CV LAB;  Service: Cardiovascular;  Laterality: Right;  SFA   POPLITEAL ARTERY STENT  01/03/2016   Contralateral access with a 7 [name] crossover sheath (second order catheter placement)   TONSILLECTOMY AND ADENOIDECTOMY     TUMOR EXCISION Right ~ 2005   cancerous tumor removed from shoulder  TUMOR EXCISION Right ~ 2000   benign tumor removed from under shoulder   TUMOR EXCISION Left 06/02/2010   resection of Lt SFA wth interposition of Gore-Tex graft    Allergies  Allergies  Allergen Reactions   Peanut-Containing Drug Products Anaphylaxis and Other (See Comments)    Tongue swelling is severe    Ace Inhibitors Cough        Diltiazem Hcl Other (See Comments)    UNSPECIFIED REACTION     Meloxicam Other (See Comments)    GI upset    Doxycycline Other (See Comments) and Cough    Reaction of cough and runny nose   Eliquis [Apixaban] Other (See Comments)    dizziness   Sertraline Hcl Other (See Comments)   Carvedilol Itching   Clonidine Hcl Other (See Comments)    Patch only - skin irritation   Duloxetine Hcl Other (See Comments)    Urinary frequency    History of Present Illness    Mr. Roosevelt returns to the  office today for ongoing assessment and management of coronary artery disease, PAF on Xarelto CHA2DS2-VASc score of 5, hypertension, hyperlipidemia, with history of PAD, pacemaker in situ and type 2 diabetes.    Last seen in the office by Edd Fabian on 09/25/2022 and was doing well, exercising at the [facility].  Unfortunately he had 2 recent falls.,  No medication changes were made on that office visit.  He was to continue remote pacemaker interrogation.  He comes today with multiple complaints.  His daughter is also on the phone listening in to the clinic visit and helping with history.  The patient states that he is taking more and more nitroglycerin sublingual lately.  He states he feels chest discomfort and tightness in the upper chest radiating into the neck with significant pain and tightness.  He takes nitroglycerin and it is relieved.  He reports that on 1 day he took 4 separate doses of nitroglycerin a few hours apart for recurrent discomfort in his chest.  With the aid of his daughter on the phone, she states that he is not very active at all.  He has also been recently diagnosed with CLL, is being followed by oncology and is receiving iron infusions as well.  He denies any bleeding, hemoptysis, or dizziness, or dyspnea on exertion.  However he is deconditioned and finds it difficult to do exertional like.  His other complaints are more musculoskeletal in etiology.  Home Medications    Current Outpatient Medications  Medication Sig Dispense Refill   amLODipine (NORVASC) 5 MG tablet Take 1 tablet (5 mg total) by mouth daily. 90 tablet 3   atorvastatin (LIPITOR) 80 MG tablet Take 1 tablet (80 mg total) by mouth daily at 6 PM. 90 tablet 1   clopidogrel (PLAVIX) 75 MG tablet Take 1 tablet (75 mg total) by mouth daily with breakfast. 90 tablet 3   dorzolamide-timolol (COSOPT) 22.3-6.8 MG/ML ophthalmic solution Place 1 drop into both eyes 2 (two) times daily.     glipiZIDE (GLUCOTROL) 5 MG tablet  Take 5 mg by mouth daily with breakfast.      hydrALAZINE (APRESOLINE) 25 MG tablet Take 1 tablet (25 mg total) by mouth as needed (for BP >150 sys or 90 dia).     hydrochlorothiazide (HYDRODIURIL) 25 MG tablet Take 25 mg by mouth every morning.     isosorbide mononitrate (IMDUR) 60 MG 24 hr tablet Take 1.5 tablets (90 mg total) by mouth daily. 90 tablet 3  ketorolac (ACULAR) 0.4 % SOLN Place 1 drop into both eyes 4 (four) times daily.     linezolid (ZYVOX) 600 MG tablet Take 600 mg by mouth 2 (two) times daily.     melatonin 5 MG TABS Take 5 mg by mouth at bedtime.     metFORMIN (GLUCOPHAGE) 1000 MG tablet Take 1 tablet (1,000 mg total) by mouth 2 (two) times daily with a meal. Restart on 3/18.     metoprolol tartrate (LOPRESSOR) 100 MG tablet Take 1 tablet (100 mg total) by mouth 2 (two) times daily. 180 tablet 3   MULTIPLE VITAMIN-FOLIC ACID PO Take 1 tablet by mouth daily.     mupirocin ointment (BACTROBAN) 2 % Apply 1 Application topically 2 (two) times daily.     potassium chloride SA (K-DUR,KLOR-CON) 20 MEQ tablet Take 1 tablet (20 mEq total) by mouth daily. 15 tablet 0   ranolazine (RANEXA) 1000 MG SR tablet Take 1 tablet (1,000 mg total) by mouth 2 (two) times daily. 60 tablet 5   tamsulosin (FLOMAX) 0.4 MG CAPS capsule Take 0.4 mg by mouth 2 (two) times daily.      XARELTO 20 MG TABS tablet TAKE 1 TABLET EVERY DAY WITH SUPPER 90 tablet 3   nitroGLYCERIN (NITROSTAT) 0.4 MG SL tablet Place 1 tablet (0.4 mg total) under the tongue every 5 (five) minutes as needed for chest pain. 25 tablet 3   No current facility-administered medications for this visit.     Family History    Family History  Problem Relation Age of Onset   Coronary artery disease Mother    Heart attack Mother    Hypertension Mother    Heart disease Mother        Open  Heart surgery   Diabetes Father    Heart disease Father    Hyperlipidemia Father    Heart attack Father    Hypertension Sister    Diabetes  Sister    Heart disease Sister    He indicated that his mother is deceased. He indicated that his father is deceased. He indicated that his sister is alive. He indicated that his maternal grandmother is deceased. He indicated that his maternal grandfather is deceased. He indicated that his paternal grandmother is deceased. He indicated that his paternal grandfather is deceased.  Social History    Social History   Socioeconomic History   Marital status: Married    Spouse name: Not on file   Number of children: Not on file   Years of education: Not on file   Highest education level: Not on file  Occupational History   Not on file  Tobacco Use   Smoking status: Former    Types: Pipe    Quit date: 76    Years since quitting: 51.6   Smokeless tobacco: Never   Tobacco comments:    "quit smoking in 1973"  Vaping Use   Vaping status: Never Used  Substance and Sexual Activity   Alcohol use: No   Drug use: No   Sexual activity: Not Currently  Other Topics Concern   Not on file  Social History Narrative   Not on file   Social Determinants of Health   Financial Resource Strain: Not on file  Food Insecurity: No Food Insecurity (09/07/2021)   Received from [facility], [facility]   Hunger Vital Sign    Worried About Running Out of Food in the Last Year: Never true    Ran Out of Food in the  Last Year: Never true  Transportation Needs: Not on file  Physical Activity: Not on file  Stress: Not on file  Social Connections: Unknown (08/18/2021)   Received from [facility], [facility]   Social Network    Social Network: Not on file  Intimate Partner Violence: Not At Risk (09/10/2022)   Received from [facility], [facility]   HITS    Over the last 12 months how often did your partner physically hurt you?: 1    Over the last 12 months how often did your partner insult you or talk down to you?: 1    Over the last 12 months how often did your partner threaten you with  physical harm?: 1    Over the last 12 months how often did your partner scream or curse at you?: 1     Review of Systems    General:  No chills, fever, night sweats or weight changes.  Cardiovascular: Positive for chest pain, positive for dyspnea on exertion, edema, orthopnea, palpitations, paroxysmal nocturnal dyspnea. Dermatological: No rash, lesions/masses Respiratory: No cough, dyspnea Urologic: No hematuria, dysuria Abdominal:   No nausea, vomiting, diarrhea, bright red blood per rectum, melena, or hematemesis Neurologic:  No visual changes, wkns, changes in mental status. All other systems reviewed and are otherwise negative except as noted above.       Physical Exam    VS:  BP (!) 150/60 (BP Location: Right Arm, Patient Position: Sitting, Cuff Size: Normal)   Pulse 75   Ht 5' 7.5" (1.715 m)   Wt 169 lb 6.4 oz (76.8 kg)   SpO2 98%   BMI 26.14 kg/m  , BMI Body mass index is 26.14 kg/m.     GEN: Well nourished, well developed, in no acute distress. HEENT: normal. Neck: Supple, no JVD, carotid bruits, or masses. Cardiac: RRR, soft systolic murmurs, no rubs, or gallops. No clubbing, cyanosis, edema.  Radials/DP/PT 2+ and equal bilaterally.  Respiratory:  Respirations regular and unlabored, clear to auscultation bilaterally. GI: Soft, nontender, nondistended, BS + x 4. MS: no deformity or atrophy. Skin: warm and dry, no rash.  Pale Neuro:  Strength and sensation are intact. Psych: Normal affect.      Lab Results  Component Value Date   WBC 17.5 (H) 11/15/2022   HGB 7.8 (L) 11/15/2022   HCT 25.6 (L) 11/15/2022   MCV 76.2 (L) 11/15/2022   PLT 135 (L) 11/15/2022   Lab Results  Component Value Date   CREATININE 1.00 11/15/2022   BUN 23 11/15/2022   NA 141 11/15/2022   K 4.2 11/15/2022   CL 108 11/15/2022   CO2 26 11/15/2022   Lab Results  Component Value Date   ALT 11 11/15/2022   AST 14 (L) 11/15/2022   ALKPHOS 51 11/15/2022   BILITOT 0.5 11/15/2022    Lab Results  Component Value Date   CHOL 88 (L) 06/30/2021   HDL 35 (L) 06/30/2021   LDLCALC 38 06/30/2021   TRIG 71 06/30/2021   CHOLHDL 2.5 06/30/2021    Lab Results  Component Value Date   HGBA1C 6.8 (H) 02/06/2022     Review of Prior Studies    Echocardiogram 06/22/2020   IMPRESSIONS     1. Left ventricular ejection fraction, by estimation, is 55 to 60%. The  left ventricle has normal function. The left ventricle demonstrates  regional wall motion abnormalities (see scoring diagram/findings for  description). There is mild concentric left  ventricular hypertrophy. Left ventricular diastolic parameters  are  consistent with Grade II diastolic dysfunction (pseudonormalization).  Elevated left atrial pressure. There is mild hypokinesis of the left  ventricular, basal inferior wall.   2. Right ventricular systolic function is normal. The right ventricular  size is normal. There is normal pulmonary artery systolic pressure. The  estimated right ventricular systolic pressure is 30.1 mmHg.   3. Left atrial size was moderately dilated.   4. Right atrial size was mildly dilated.   5. The mitral valve is normal in structure. No evidence of mitral valve  regurgitation. No evidence of mitral stenosis.   6. The aortic valve is normal in structure. Aortic valve regurgitation is  not visualized. No aortic stenosis is present.   7. The inferior vena cava is normal in size with greater than 50%  respiratory variability, suggesting right atrial pressure of 3 mmHg.   Comparison(s): No significant change from prior study. Prior images  reviewed side by side. The left ventricular function is unchanged. The  left ventricular wall motion abnormality is unchanged.   FINDINGS   Left Ventricle: Left ventricular ejection fraction, by estimation, is 55  to 60%. The left ventricle has normal function. The left ventricle  demonstrates regional wall motion abnormalities. Mild hypokinesis of the   left ventricular, basal inferior wall.  The left ventricular internal cavity size was normal in size. There is  mild concentric left ventricular hypertrophy. Abnormal (paradoxical)  septal motion consistent with post-operative status. Left ventricular  diastolic parameters are consistent with  Grade II diastolic dysfunction (pseudonormalization). Elevated left atrial  pressure.   Right Ventricle: The right ventricular size is normal. No increase in  right ventricular wall thickness. Right ventricular systolic function is  normal. There is normal pulmonary artery systolic pressure. The tricuspid  regurgitant velocity is 2.35 m/s, and   with an assumed right atrial pressure of 8 mmHg, the estimated right  ventricular systolic pressure is 30.1 mmHg.   Left Atrium: Left atrial size was moderately dilated.   Right Atrium: Right atrial size was mildly dilated.   Pericardium: There is no evidence of pericardial effusion.   Mitral Valve: The mitral valve is normal in structure. No evidence of  mitral valve regurgitation. No evidence of mitral valve stenosis. MV peak  gradient, 4.7 mmHg. The mean mitral valve gradient is 2.0 mmHg.   Tricuspid Valve: The tricuspid valve is normal in structure. Tricuspid  valve regurgitation is not demonstrated. No evidence of tricuspid  stenosis.   Aortic Valve: The aortic valve is normal in structure. Aortic valve  regurgitation is not visualized. No aortic stenosis is present. Aortic  valve mean gradient measures 2.0 mmHg. Aortic valve peak gradient measures  4.1 mmHg. Aortic valve area, by VTI  measures 2.47 cm.   Pulmonic Valve: The pulmonic valve was normal in structure. Pulmonic valve  regurgitation is not visualized. No evidence of pulmonic stenosis.   Aorta: The aortic root is normal in size and structure.   Venous: The inferior vena cava is normal in size with greater than 50%  respiratory variability, suggesting right atrial pressure of 3  mmHg.   IAS/Shunts: No atrial level shunt detected by color flow Doppler.   Additional Comments: A device lead is visualized.      Nuclear stress test 11/12/2020   There was no ST segment deviation noted during stress. The left ventricular ejection fraction is moderately decreased (30-44%). Nuclear stress EF: 37%. Defect 1: There is a small defect of mild severity present in the basal inferior  and mid inferior location. Findings consistent with prior myocardial infarction with peri-infarct ischemia. This is an intermediate risk study.   1. Partially reversible basal to mid inferior perfusion defect, consistent with infarct with peri-infarct ischemia. 2. Intermediate risk study due to moderate systolic dysfunction (37%).  Amount of ischemia is small      Assessment & Plan   1.  Unstable angina: Known history of CABG, multiple stent placements.  The patient is having recurrent discomfort in his upper chest, radiating into his neck neck which is nonexertional and relieved by nitroglycerin.  The patient has multiple etiologies to have recurrent chest pain to include anemia, with requirement of iron infusions.  Review of labs revealed a hemoglobin of 7.16 November 2022.  I am uncertain that we need to move forward with repeating cardiac catheterization although this is something he would like to do.  I had considered repeating a stress test but he reports that stress test do not reveal ischemia, but cardiac catheterizations are more diagnostic each time he has required stenting.    Due to other comorbidities with CLL, significant anemia, chronic deconditioning, I would like to treat him conservatively by increasing his isosorbide from 60 mg daily to 90 mg daily.  As result of going up on the isosorbide I will decrease his amlodipine from 10 mg daily to 5 mg daily to avoid hypotension.  I am going to see him back in a few weeks to evaluate his response to medication.  He should continue Ranexa 1000  mg twice daily as directed.  He was also given refills on nitroglycerin sublingual.  I will discuss this with Dr. Allyson Sabal in more detail, he is also to see Dr. Allyson Sabal in October 2024.  For now continue DAPT.  No evidence of bleeding currently reported by the patient.  2.  Hypercholesterolemia: Remains on atorvastatin.  Goal of LDL less than 70.  Repeat labs are followed by PCP.  If not completed on next office visit will plan to order.  3.  Paroxysmal atrial fibrillation: Remains on rate control with metoprolol 100 mg twice daily, anticoagulation with Xarelto 20 mg daily.  He denies any racing heart or palpitations currently.  No changes in his medication regimen for now.  CHA2DS2-VASc score of 4.  4.  Pacemaker in situ: Followed remotely, and annual visits per protocol.  5.  PAD: History of bare-metal stent to the L FSA, staged PTA being of the L SFA short segment proximal occlusion, staged PTCA with the be in 2017 of the R SFA.  Continues to complain of generalized weakness in his legs, but no intermittent claudication symptoms.  6.  Anemia: Patient's daughter states that he is being evaluated for bleeding as a cause of his anemia.  He is being followed by oncology for iron deficiency with infusions.  The patient remains on Xarelto and Plavix.  May need to consider discontinuing antiplatelet therapy.  Will defer to Dr. Gery Pray concerning this if he does indeed have evidence of discrete bleeding on evaluation.  Concerning Xarelto this will also need to be discussed once follow-up testing is completed.  High risk for CVA with CHA2DS2-VASc score of 4.      I have spent 40 minutes with this patient discussing his symptoms, speaking with his daughter via phone, and assessment. Signed, Bettey Mare. Liborio Nixon, ANP, AACC   11/21/2022 4:10 PM      Office [phone] Fax [phone]  Notice: This dictation was prepared with Dragon dictation along with  smaller phrase technology. Any transcriptional  errors that result from this process are unintentional and may not be corrected upon review.

## 2022-11-20 NOTE — Progress Notes (Signed)
Diagnosis: Iron Deficiency Anemia  Provider:  Chilton Greathouse MD  Procedure: IV Infusion  IV Type: Peripheral, IV Location: R Hand  Venofer (Iron Sucrose), Dose: 200 mg  Infusion Start Time: 1349  Infusion Stop Time: 1406  Post Infusion IV Care: Observation period completed. PIV removed.  Discharge: Condition: Good, Destination: Home . AVS Provided  Performed by:  Rico Ala, LPN

## 2022-11-20 NOTE — Telephone Encounter (Signed)
*  STAT* If patient is at the pharmacy, call can be transferred to refill team.   1. Which medications need to be refilled? (please list name of each medication and dose if known)  nitroGLYCERIN (NITROSTAT) 0.4 MG SL tablet  2. Which pharmacy/location (including street and city if local pharmacy) is medication to be sent to?  [place], [address] AT [place]  3. Do they need a 30 day or 90 day supply? [place]

## 2022-11-21 ENCOUNTER — Ambulatory Visit: Payer: Medicare HMO | Attending: Adult Health | Admitting: Adult Health

## 2022-11-21 ENCOUNTER — Encounter: Payer: Self-pay | Admitting: Adult Health

## 2022-11-21 VITALS — BP 150/60 | HR 75 | Ht 67.5 in | Wt 169.4 lb

## 2022-11-21 DIAGNOSIS — I25110 Atherosclerotic heart disease of native coronary artery with unstable angina pectoris: Secondary | ICD-10-CM | POA: Diagnosis not present

## 2022-11-21 DIAGNOSIS — Z950 Presence of cardiac pacemaker: Secondary | ICD-10-CM | POA: Diagnosis not present

## 2022-11-21 DIAGNOSIS — I739 Peripheral vascular disease, unspecified: Secondary | ICD-10-CM

## 2022-11-21 DIAGNOSIS — D500 Iron deficiency anemia secondary to blood loss (chronic): Secondary | ICD-10-CM | POA: Diagnosis not present

## 2022-11-21 DIAGNOSIS — E785 Hyperlipidemia, unspecified: Secondary | ICD-10-CM

## 2022-11-21 DIAGNOSIS — I480 Paroxysmal atrial fibrillation: Secondary | ICD-10-CM | POA: Diagnosis not present

## 2022-11-21 DIAGNOSIS — I2510 Atherosclerotic heart disease of native coronary artery without angina pectoris: Secondary | ICD-10-CM

## 2022-11-21 DIAGNOSIS — E1169 Type 2 diabetes mellitus with other specified complication: Secondary | ICD-10-CM

## 2022-11-21 DIAGNOSIS — I200 Unstable angina: Secondary | ICD-10-CM

## 2022-11-21 MED ORDER — NITROGLYCERIN 0.4 MG SL SUBL: 0.4000 mg | SUBLINGUAL_TABLET | SUBLINGUAL | 3 refills | Status: DC | PRN

## 2022-11-21 MED ORDER — AMLODIPINE BESYLATE 5 MG PO TABS: 5.0000 mg | ORAL_TABLET | Freq: Every day | ORAL | 3 refills | Status: DC

## 2022-11-21 MED ORDER — ISOSORBIDE MONONITRATE ER 60 MG PO TB24: 90.0000 mg | ORAL_TABLET | Freq: Every day | ORAL | 3 refills | Status: DC

## 2022-11-21 NOTE — Telephone Encounter (Signed)
Desired medication sent to desired pharmacy. I was unable to reach patient.

## 2022-11-21 NOTE — Patient Instructions (Signed)
Medication Instructions:  Decrease Amlodipine to 5 mg ( Take 1 Tablet Daily). Increase Imdur to 90 mg ( Take 1.5 Tablets Daily). *If you need a refill on your cardiac medications before your next appointment, please call your pharmacy*   Lab Work: No labs If you have labs (blood work) drawn today and your tests are completely normal, you will receive your results only by: MyChart Message (if you have MyChart) OR A paper copy in the mail If you have any lab test that is abnormal or we need to change your treatment, we will call you to review the results.   Testing/Procedures: No Testing    Follow-Up: At [facility], you and your health needs are our priority.  As part of our continuing mission to provide you with exceptional heart care, we have created designated Provider Care Teams.  These Care Teams include your primary Cardiologist (physician) and Advanced Practice Providers (APPs -  Physician Assistants and Nurse Practitioners) who all work together to provide you with the care you need, when you need it.  We recommend signing up for the patient portal called "MyChart".  Sign up information is provided on this After Visit Summary.  MyChart is used to connect with patients for Virtual Visits (Telemedicine).  Patients are able to view lab/test results, encounter notes, upcoming appointments, etc.  Non-urgent messages can be sent to your provider as well.   To learn more about what you can do with MyChart, go to [email or web address].    Your next appointment:   3 week(s)  Provider:   Joni Reining, DNP, ANP      Other Instructions Blood Pressure Below 110/60 Please Call Office.

## 2022-11-23 ENCOUNTER — Inpatient Hospital Stay: Payer: Medicare HMO

## 2022-11-23 ENCOUNTER — Other Ambulatory Visit: Payer: Self-pay

## 2022-11-23 DIAGNOSIS — Z79899 Other long term (current) drug therapy: Secondary | ICD-10-CM | POA: Diagnosis not present

## 2022-11-23 DIAGNOSIS — D500 Iron deficiency anemia secondary to blood loss (chronic): Secondary | ICD-10-CM

## 2022-11-23 DIAGNOSIS — Z87891 Personal history of nicotine dependence: Secondary | ICD-10-CM | POA: Diagnosis not present

## 2022-11-23 DIAGNOSIS — C9110 Chronic lymphocytic leukemia of B-cell type not having achieved remission: Secondary | ICD-10-CM | POA: Diagnosis not present

## 2022-11-23 DIAGNOSIS — D649 Anemia, unspecified: Secondary | ICD-10-CM | POA: Diagnosis not present

## 2022-11-23 LAB — CBC WITH DIFFERENTIAL (CANCER CENTER ONLY)
Abs Immature Granulocytes: 0.06 10*3/uL (ref 0.00–0.07)
Basophils Absolute: 0.1 10*3/uL (ref 0.0–0.1)
Basophils Relative: 0 %
Eosinophils Absolute: 0.2 10*3/uL (ref 0.0–0.5)
Eosinophils Relative: 1 %
HCT: 28.3 % — ABNORMAL LOW (ref 39.0–52.0)
Hemoglobin: 8.5 g/dL — ABNORMAL LOW (ref 13.0–17.0)
Immature Granulocytes: 0 %
Lymphocytes Relative: 66 %
Lymphs Abs: 12 10*3/uL — ABNORMAL HIGH (ref 0.7–4.0)
MCH: 22.8 pg — ABNORMAL LOW (ref 26.0–34.0)
MCHC: 30 g/dL (ref 30.0–36.0)
MCV: 75.9 fL — ABNORMAL LOW (ref 80.0–100.0)
Monocytes Absolute: 0.8 10*3/uL (ref 0.1–1.0)
Monocytes Relative: 5 %
Neutro Abs: 5.1 10*3/uL (ref 1.7–7.7)
Neutrophils Relative %: 28 %
Platelet Count: 175 10*3/uL (ref 150–400)
RBC: 3.73 MIL/uL — ABNORMAL LOW (ref 4.22–5.81)
RDW: 16.8 % — ABNORMAL HIGH (ref 11.5–15.5)
WBC Count: 18.2 10*3/uL — ABNORMAL HIGH (ref 4.0–10.5)
nRBC: 0 % (ref 0.0–0.2)

## 2022-11-23 LAB — IRON AND IRON BINDING CAPACITY (CC-WL,HP ONLY)
Iron: 35 ug/dL — ABNORMAL LOW (ref 45–182)
Saturation Ratios: 7 % — ABNORMAL LOW (ref 17.9–39.5)
TIBC: 487 ug/dL — ABNORMAL HIGH (ref 250–450)
UIBC: 452 ug/dL — ABNORMAL HIGH (ref 117–376)

## 2022-11-23 LAB — CMP (CANCER CENTER ONLY)
ALT: 11 U/L (ref 0–44)
AST: 13 U/L — ABNORMAL LOW (ref 15–41)
Albumin: 4.3 g/dL (ref 3.5–5.0)
Alkaline Phosphatase: 57 U/L (ref 38–126)
Anion gap: 9 (ref 5–15)
BUN: 33 mg/dL — ABNORMAL HIGH (ref 8–23)
CO2: 25 mmol/L (ref 22–32)
Calcium: 9.7 mg/dL (ref 8.9–10.3)
Chloride: 108 mmol/L (ref 98–111)
Creatinine: 1.02 mg/dL (ref 0.61–1.24)
GFR, Estimated: >60 mL/min (ref >60)
Glucose, Bld: 134 mg/dL — ABNORMAL HIGH (ref 70–99)
Potassium: 4.3 mmol/L (ref 3.5–5.1)
Sodium: 142 mmol/L (ref 135–145)
Total Bilirubin: 0.5 mg/dL (ref 0.3–1.2)
Total Protein: 6.5 g/dL (ref 6.5–8.1)

## 2022-11-23 LAB — SAMPLE TO BLOOD BANK

## 2022-11-23 LAB — FERRITIN: Ferritin: 59 ng/mL (ref 24–336)

## 2022-11-25 ENCOUNTER — Encounter: Payer: Self-pay | Admitting: Hematology and Oncology

## 2022-11-27 DIAGNOSIS — M25559 Pain in unspecified hip: Secondary | ICD-10-CM | POA: Diagnosis not present

## 2022-11-27 DIAGNOSIS — M25562 Pain in left knee: Secondary | ICD-10-CM | POA: Diagnosis not present

## 2022-11-27 DIAGNOSIS — D649 Anemia, unspecified: Secondary | ICD-10-CM | POA: Diagnosis not present

## 2022-11-27 DIAGNOSIS — I209 Angina pectoris, unspecified: Secondary | ICD-10-CM | POA: Diagnosis not present

## 2022-11-27 DIAGNOSIS — R21 Rash and other nonspecific skin eruption: Secondary | ICD-10-CM | POA: Diagnosis not present

## 2022-11-27 DIAGNOSIS — M1990 Unspecified osteoarthritis, unspecified site: Secondary | ICD-10-CM | POA: Diagnosis not present

## 2022-11-28 ENCOUNTER — Ambulatory Visit (INDEPENDENT_AMBULATORY_CARE_PROVIDER_SITE_OTHER): Payer: Medicare HMO | Admitting: *Deleted

## 2022-11-28 VITALS — BP 154/72 | HR 60 | Temp 97.5°F | Resp 16 | Ht 67.0 in | Wt 170.0 lb

## 2022-11-28 DIAGNOSIS — D500 Iron deficiency anemia secondary to blood loss (chronic): Secondary | ICD-10-CM

## 2022-11-28 DIAGNOSIS — D509 Iron deficiency anemia, unspecified: Secondary | ICD-10-CM

## 2022-11-28 MED ORDER — SODIUM CHLORIDE 0.9 % IV SOLN
200.0000 mg | Freq: Once | INTRAVENOUS | Status: AC
  Administered 2022-11-28: 200 mg via INTRAVENOUS
  Filled 2022-11-28: qty 10

## 2022-11-28 MED ORDER — ACETAMINOPHEN 325 MG PO TABS
650.0000 mg | ORAL_TABLET | Freq: Once | ORAL | Status: AC
  Administered 2022-11-28: 650 mg via ORAL
  Filled 2022-11-28: qty 2

## 2022-11-28 MED ORDER — DIPHENHYDRAMINE HCL 25 MG PO CAPS
25.0000 mg | ORAL_CAPSULE | Freq: Once | ORAL | Status: AC
  Administered 2022-11-28: 25 mg via ORAL
  Filled 2022-11-28: qty 1

## 2022-11-28 NOTE — Patient Instructions (Signed)
 Iron Sucrose Injection What is this medication? IRON SUCROSE (EYE ern SOO krose) treats low levels of iron (iron deficiency anemia) in people with kidney disease. Iron is a mineral that plays an important role in making red blood cells, which carry oxygen from your lungs to the rest of your body. This medicine may be used for other purposes; ask your health care provider or pharmacist if you have questions. COMMON BRAND NAME(S): Venofer What should I tell my care team before I take this medication? They need to know if you have any of these conditions: Anemia not caused by low iron levels Heart disease High levels of iron in the blood Kidney disease Liver disease An unusual or allergic reaction to iron, other medications, foods, dyes, or preservatives Pregnant or trying to get pregnant Breastfeeding How should I use this medication? This medication is for infusion into a vein. It is given in a hospital or clinic setting. Talk to your care team about the use of this medication in children. While this medication may be prescribed for children as young as 2 years for selected conditions, precautions do apply. Overdosage: If you think you have taken too much of this medicine contact a poison control center or emergency room at once. NOTE: This medicine is only for you. Do not share this medicine with others. What if I miss a dose? Keep appointments for follow-up doses. It is important not to miss your dose. Call your care team if you are unable to keep an appointment. What may interact with this medication? Do not take this medication with any of the following: Deferoxamine Dimercaprol Other iron products This medication may also interact with the following: Chloramphenicol Deferasirox This list may not describe all possible interactions. Give your health care provider a list of all the medicines, herbs, non-prescription drugs, or dietary supplements you use. Also tell them if you smoke,  drink alcohol, or use illegal drugs. Some items may interact with your medicine. What should I watch for while using this medication? Visit your care team regularly. Tell your care team if your symptoms do not start to get better or if they get worse. You may need blood work done while you are taking this medication. You may need to follow a special diet. Talk to your care team. Foods that contain iron include: whole grains/cereals, dried fruits, beans, or peas, leafy green vegetables, and organ meats (liver, kidney). What side effects may I notice from receiving this medication? Side effects that you should report to your care team as soon as possible: Allergic reactions--skin rash, itching, hives, swelling of the face, lips, tongue, or throat Low blood pressure--dizziness, feeling faint or lightheaded, blurry vision Shortness of breath Side effects that usually do not require medical attention (report to your care team if they continue or are bothersome): Flushing Headache Joint pain Muscle pain Nausea Pain, redness, or irritation at injection site This list may not describe all possible side effects. Call your doctor for medical advice about side effects. You may report side effects to FDA at 1-800-FDA-1088. Where should I keep my medication? This medication is given in a hospital or clinic. It will not be stored at home. NOTE: This sheet is a summary. It may not cover all possible information. If you have questions about this medicine, talk to your doctor, pharmacist, or health care provider.  2024 [employer]vier/Gold Standard (2022-09-01 00:00:00)

## 2022-11-28 NOTE — Progress Notes (Addendum)
Diagnosis: Iron Deficiency Anemia  Provider:  Chilton Greathouse MD  Procedure: IV Infusion  IV Type: Peripheral, IV Location: R Antecubital  Venofer (Iron Sucrose), Dose: 200 mg  Infusion Start Time: 1046 am  Infusion Stop Time: 1103 am  Post Infusion IV Care: Observation period completed and Peripheral IV Discontinued  Discharge: Condition: Good, Destination: Home . AVS Provided  Performed by:  Forrest Moron, RN

## 2022-11-30 ENCOUNTER — Inpatient Hospital Stay (HOSPITAL_COMMUNITY)
Admission: EM | Admit: 2022-11-30 | Discharge: 2022-12-02 | DRG: 281 | Disposition: A | Discharge status: Home / Self Care | Payer: Medicare HMO | Attending: Family Medicine | Admitting: Family Medicine

## 2022-11-30 ENCOUNTER — Encounter (HOSPITAL_COMMUNITY): Payer: Self-pay | Admitting: Emergency Medicine

## 2022-11-30 ENCOUNTER — Other Ambulatory Visit: Payer: Self-pay

## 2022-11-30 ENCOUNTER — Emergency Department (HOSPITAL_COMMUNITY): Payer: Medicare HMO

## 2022-11-30 ENCOUNTER — Inpatient Hospital Stay (HOSPITAL_COMMUNITY): Payer: Medicare HMO

## 2022-11-30 ENCOUNTER — Inpatient Hospital Stay: Payer: Medicare HMO

## 2022-11-30 DIAGNOSIS — Z79899 Other long term (current) drug therapy: Secondary | ICD-10-CM

## 2022-11-30 DIAGNOSIS — I480 Paroxysmal atrial fibrillation: Secondary | ICD-10-CM | POA: Diagnosis not present

## 2022-11-30 DIAGNOSIS — C9110 Chronic lymphocytic leukemia of B-cell type not having achieved remission: Secondary | ICD-10-CM | POA: Diagnosis not present

## 2022-11-30 DIAGNOSIS — Z85828 Personal history of other malignant neoplasm of skin: Secondary | ICD-10-CM | POA: Diagnosis not present

## 2022-11-30 DIAGNOSIS — D508 Other iron deficiency anemias: Secondary | ICD-10-CM

## 2022-11-30 DIAGNOSIS — E118 Type 2 diabetes mellitus with unspecified complications: Secondary | ICD-10-CM | POA: Diagnosis not present

## 2022-11-30 DIAGNOSIS — I2510 Atherosclerotic heart disease of native coronary artery without angina pectoris: Secondary | ICD-10-CM | POA: Diagnosis not present

## 2022-11-30 DIAGNOSIS — R918 Other nonspecific abnormal finding of lung field: Secondary | ICD-10-CM | POA: Diagnosis not present

## 2022-11-30 DIAGNOSIS — I25810 Atherosclerosis of coronary artery bypass graft(s) without angina pectoris: Secondary | ICD-10-CM | POA: Diagnosis present

## 2022-11-30 DIAGNOSIS — Z951 Presence of aortocoronary bypass graft: Secondary | ICD-10-CM

## 2022-11-30 DIAGNOSIS — I200 Unstable angina: Secondary | ICD-10-CM | POA: Diagnosis present

## 2022-11-30 DIAGNOSIS — Z7901 Long term (current) use of anticoagulants: Secondary | ICD-10-CM

## 2022-11-30 DIAGNOSIS — D500 Iron deficiency anemia secondary to blood loss (chronic): Secondary | ICD-10-CM | POA: Diagnosis present

## 2022-11-30 DIAGNOSIS — I214 Non-ST elevation (NSTEMI) myocardial infarction: Principal | ICD-10-CM | POA: Diagnosis present

## 2022-11-30 DIAGNOSIS — Z881 Allergy status to other antibiotic agents status: Secondary | ICD-10-CM

## 2022-11-30 DIAGNOSIS — I495 Sick sinus syndrome: Secondary | ICD-10-CM | POA: Diagnosis not present

## 2022-11-30 DIAGNOSIS — I110 Hypertensive heart disease with heart failure: Secondary | ICD-10-CM | POA: Diagnosis not present

## 2022-11-30 DIAGNOSIS — Z950 Presence of cardiac pacemaker: Secondary | ICD-10-CM

## 2022-11-30 DIAGNOSIS — Z7982 Long term (current) use of aspirin: Secondary | ICD-10-CM

## 2022-11-30 DIAGNOSIS — Z8249 Family history of ischemic heart disease and other diseases of the circulatory system: Secondary | ICD-10-CM | POA: Diagnosis not present

## 2022-11-30 DIAGNOSIS — R079 Chest pain, unspecified: Secondary | ICD-10-CM | POA: Diagnosis not present

## 2022-11-30 DIAGNOSIS — Z7984 Long term (current) use of oral hypoglycemic drugs: Secondary | ICD-10-CM | POA: Diagnosis not present

## 2022-11-30 DIAGNOSIS — I5022 Chronic systolic (congestive) heart failure: Secondary | ICD-10-CM | POA: Diagnosis present

## 2022-11-30 DIAGNOSIS — E1151 Type 2 diabetes mellitus with diabetic peripheral angiopathy without gangrene: Secondary | ICD-10-CM | POA: Diagnosis present

## 2022-11-30 DIAGNOSIS — M1990 Unspecified osteoarthritis, unspecified site: Secondary | ICD-10-CM | POA: Diagnosis not present

## 2022-11-30 DIAGNOSIS — Z87891 Personal history of nicotine dependence: Secondary | ICD-10-CM | POA: Diagnosis not present

## 2022-11-30 DIAGNOSIS — Z91010 Allergy to peanuts: Secondary | ICD-10-CM

## 2022-11-30 DIAGNOSIS — M159 Polyosteoarthritis, unspecified: Secondary | ICD-10-CM | POA: Diagnosis present

## 2022-11-30 DIAGNOSIS — Z833 Family history of diabetes mellitus: Secondary | ICD-10-CM

## 2022-11-30 DIAGNOSIS — I700 Atherosclerosis of aorta: Secondary | ICD-10-CM | POA: Diagnosis not present

## 2022-11-30 DIAGNOSIS — I5021 Acute systolic (congestive) heart failure: Secondary | ICD-10-CM | POA: Diagnosis not present

## 2022-11-30 DIAGNOSIS — I10 Essential (primary) hypertension: Secondary | ICD-10-CM

## 2022-11-30 DIAGNOSIS — Z888 Allergy status to other drugs, medicaments and biological substances status: Secondary | ICD-10-CM

## 2022-11-30 DIAGNOSIS — G4733 Obstructive sleep apnea (adult) (pediatric): Secondary | ICD-10-CM | POA: Diagnosis present

## 2022-11-30 DIAGNOSIS — N400 Enlarged prostate without lower urinary tract symptoms: Secondary | ICD-10-CM | POA: Diagnosis present

## 2022-11-30 DIAGNOSIS — Z8349 Family history of other endocrine, nutritional and metabolic diseases: Secondary | ICD-10-CM

## 2022-11-30 DIAGNOSIS — E785 Hyperlipidemia, unspecified: Secondary | ICD-10-CM | POA: Diagnosis present

## 2022-11-30 DIAGNOSIS — E1165 Type 2 diabetes mellitus with hyperglycemia: Secondary | ICD-10-CM | POA: Diagnosis not present

## 2022-11-30 DIAGNOSIS — Z955 Presence of coronary angioplasty implant and graft: Secondary | ICD-10-CM

## 2022-11-30 DIAGNOSIS — Z85820 Personal history of malignant melanoma of skin: Secondary | ICD-10-CM | POA: Diagnosis not present

## 2022-11-30 DIAGNOSIS — Z7902 Long term (current) use of antithrombotics/antiplatelets: Secondary | ICD-10-CM

## 2022-11-30 LAB — CMP (CANCER CENTER ONLY)
ALT: 16 U/L (ref 0–44)
AST: 20 U/L (ref 15–41)
Albumin: 4.8 g/dL (ref 3.5–5.0)
Alkaline Phosphatase: 58 U/L (ref 38–126)
Anion gap: 13 (ref 5–15)
BUN: 32 mg/dL — ABNORMAL HIGH (ref 8–23)
CO2: 23 mmol/L (ref 22–32)
Calcium: 9.7 mg/dL (ref 8.9–10.3)
Chloride: 105 mmol/L (ref 98–111)
Creatinine: 1.07 mg/dL (ref 0.61–1.24)
GFR, Estimated: >60 mL/min (ref >60)
Glucose, Bld: 208 mg/dL — ABNORMAL HIGH (ref 70–99)
Potassium: 4.7 mmol/L (ref 3.5–5.1)
Sodium: 141 mmol/L (ref 135–145)
Total Bilirubin: 0.8 mg/dL (ref 0.3–1.2)
Total Protein: 7.4 g/dL (ref 6.5–8.1)

## 2022-11-30 LAB — ECHOCARDIOGRAM COMPLETE
Area-P 1/2: 3.51 cm2
Height: 67 in
MV M vel: 2.73 m/s
MV Peak grad: 29.8 mmHg
P 1/2 time: 605 msec
S' Lateral: 4.4 cm
Weight: 2608 oz

## 2022-11-30 LAB — CBC WITH DIFFERENTIAL (CANCER CENTER ONLY)
Abs Immature Granulocytes: 0.14 10*3/uL — ABNORMAL HIGH (ref 0.00–0.07)
Basophils Absolute: 0.1 10*3/uL (ref 0.0–0.1)
Basophils Relative: 0 %
Eosinophils Absolute: 0 10*3/uL (ref 0.0–0.5)
Eosinophils Relative: 0 %
HCT: 34.7 % — ABNORMAL LOW (ref 39.0–52.0)
Hemoglobin: 9.9 g/dL — ABNORMAL LOW (ref 13.0–17.0)
Immature Granulocytes: 0 %
Lymphocytes Relative: 64 %
Lymphs Abs: 21.4 10*3/uL — ABNORMAL HIGH (ref 0.7–4.0)
MCH: 22.6 pg — ABNORMAL LOW (ref 26.0–34.0)
MCHC: 28.5 g/dL — ABNORMAL LOW (ref 30.0–36.0)
MCV: 79.2 fL — ABNORMAL LOW (ref 80.0–100.0)
Monocytes Absolute: 1 10*3/uL (ref 0.1–1.0)
Monocytes Relative: 3 %
Neutro Abs: 11.2 10*3/uL — ABNORMAL HIGH (ref 1.7–7.7)
Neutrophils Relative %: 33 %
Platelet Count: 212 10*3/uL (ref 150–400)
RBC: 4.38 MIL/uL (ref 4.22–5.81)
RDW: 18.7 % — ABNORMAL HIGH (ref 11.5–15.5)
WBC Count: 33.8 10*3/uL — ABNORMAL HIGH (ref 4.0–10.5)
nRBC: 0 % (ref 0.0–0.2)

## 2022-11-30 LAB — TROPONIN I (HIGH SENSITIVITY)
Troponin I (High Sensitivity): 34 ng/L — ABNORMAL HIGH (ref <18)
Troponin I (High Sensitivity): 931 ng/L (ref <18)

## 2022-11-30 LAB — BASIC METABOLIC PANEL
Anion gap: 13 (ref 5–15)
BUN: 29 mg/dL — ABNORMAL HIGH (ref 8–23)
CO2: 20 mmol/L — ABNORMAL LOW (ref 22–32)
Calcium: 9.5 mg/dL (ref 8.9–10.3)
Chloride: 104 mmol/L (ref 98–111)
Creatinine, Ser: 1.18 mg/dL (ref 0.61–1.24)
GFR, Estimated: >60 mL/min (ref >60)
Glucose, Bld: 229 mg/dL — ABNORMAL HIGH (ref 70–99)
Potassium: 4.6 mmol/L (ref 3.5–5.1)
Sodium: 137 mmol/L (ref 135–145)

## 2022-11-30 LAB — CBC
HCT: 33.5 % — ABNORMAL LOW (ref 39.0–52.0)
Hemoglobin: 9.6 g/dL — ABNORMAL LOW (ref 13.0–17.0)
MCH: 22.3 pg — ABNORMAL LOW (ref 26.0–34.0)
MCHC: 28.7 g/dL — ABNORMAL LOW (ref 30.0–36.0)
MCV: 77.7 fL — ABNORMAL LOW (ref 80.0–100.0)
Platelets: 175 10*3/uL (ref 150–400)
RBC: 4.31 MIL/uL (ref 4.22–5.81)
RDW: 18.5 % — ABNORMAL HIGH (ref 11.5–15.5)
WBC: 32.6 10*3/uL — ABNORMAL HIGH (ref 4.0–10.5)
nRBC: 0 % (ref 0.0–0.2)

## 2022-11-30 LAB — HEMOGLOBIN A1C
Hgb A1c MFr Bld: 6.6 % — ABNORMAL HIGH (ref 4.8–5.6)
Mean Plasma Glucose: 142.72 mg/dL

## 2022-11-30 LAB — SAMPLE TO BLOOD BANK

## 2022-11-30 LAB — IRON AND IRON BINDING CAPACITY (CC-WL,HP ONLY)
Iron: 56 ug/dL (ref 45–182)
Saturation Ratios: 11 % — ABNORMAL LOW (ref 17.9–39.5)
TIBC: 512 ug/dL — ABNORMAL HIGH (ref 250–450)
UIBC: 456 ug/dL — ABNORMAL HIGH (ref 117–376)

## 2022-11-30 LAB — GLUCOSE, CAPILLARY
Glucose-Capillary: 157 mg/dL — ABNORMAL HIGH (ref 70–99)
Glucose-Capillary: 191 mg/dL — ABNORMAL HIGH (ref 70–99)
Glucose-Capillary: 220 mg/dL — ABNORMAL HIGH (ref 70–99)

## 2022-11-30 LAB — MRSA NEXT GEN BY PCR, NASAL: MRSA by PCR Next Gen: NOT DETECTED

## 2022-11-30 LAB — FERRITIN: Ferritin: 118 ng/mL (ref 24–336)

## 2022-11-30 MED ORDER — PREDNISONE 20 MG PO TABS
20.0000 mg | ORAL_TABLET | Freq: Every day | ORAL | Status: AC
  Administered 2022-12-01: 20 mg via ORAL
  Filled 2022-11-30: qty 1

## 2022-11-30 MED ORDER — ALBUTEROL SULFATE (2.5 MG/3ML) 0.083% IN NEBU
2.5000 mg | INHALATION_SOLUTION | Freq: Four times a day (QID) | RESPIRATORY_TRACT | Status: DC
  Administered 2022-11-30 – 2022-12-01 (×2): 2.5 mg via RESPIRATORY_TRACT
  Filled 2022-11-30 (×2): qty 3

## 2022-11-30 MED ORDER — ONDANSETRON HCL 4 MG PO TABS: 4.0000 mg | ORAL_TABLET | Freq: Four times a day (QID) | ORAL | Status: DC | PRN

## 2022-11-30 MED ORDER — ONDANSETRON HCL 4 MG/2ML IJ SOLN: 4.0000 mg | Freq: Four times a day (QID) | INTRAMUSCULAR | Status: DC | PRN

## 2022-11-30 MED ORDER — NITROGLYCERIN 0.4 MG SL SUBL: 0.4000 mg | SUBLINGUAL_TABLET | SUBLINGUAL | Status: DC | PRN

## 2022-11-30 MED ORDER — ISOSORBIDE MONONITRATE ER 60 MG PO TB24
90.0000 mg | ORAL_TABLET | Freq: Every day | ORAL | Status: DC
  Administered 2022-12-01 – 2022-12-02 (×2): 90 mg via ORAL
  Filled 2022-11-30 (×2): qty 1

## 2022-11-30 MED ORDER — HEPARIN (PORCINE) 25000 UT/250ML-% IV SOLN
1100.0000 [IU]/h | INTRAVENOUS | Status: DC
  Administered 2022-11-30: 900 [IU]/h via INTRAVENOUS
  Filled 2022-11-30: qty 250

## 2022-11-30 MED ORDER — TAMSULOSIN HCL 0.4 MG PO CAPS
0.4000 mg | ORAL_CAPSULE | Freq: Two times a day (BID) | ORAL | Status: DC
  Administered 2022-11-30 – 2022-12-02 (×4): 0.4 mg via ORAL
  Filled 2022-11-30 (×4): qty 1

## 2022-11-30 MED ORDER — INSULIN GLARGINE-YFGN 100 UNIT/ML ~~LOC~~ SOLN
5.0000 [IU] | Freq: Every day | SUBCUTANEOUS | Status: DC
  Administered 2022-11-30 – 2022-12-02 (×3): 5 [IU] via SUBCUTANEOUS
  Filled 2022-11-30 (×3): qty 0.05

## 2022-11-30 MED ORDER — INSULIN ASPART 100 UNIT/ML IJ SOLN
0.0000 [IU] | Freq: Every day | INTRAMUSCULAR | Status: DC
  Administered 2022-11-30: 2 [IU] via SUBCUTANEOUS
  Administered 2022-12-01: 4 [IU] via SUBCUTANEOUS

## 2022-11-30 MED ORDER — METOPROLOL TARTRATE 100 MG PO TABS
100.0000 mg | ORAL_TABLET | Freq: Two times a day (BID) | ORAL | Status: DC
  Administered 2022-11-30 – 2022-12-02 (×4): 100 mg via ORAL
  Filled 2022-11-30 (×4): qty 1

## 2022-11-30 MED ORDER — ACETAMINOPHEN 650 MG RE SUPP: 650.0000 mg | Freq: Four times a day (QID) | RECTAL | Status: DC | PRN

## 2022-11-30 MED ORDER — PERFLUTREN LIPID MICROSPHERE
1.0000 mL | INTRAVENOUS | Status: AC | PRN
  Administered 2022-11-30: 2 mL via INTRAVENOUS

## 2022-11-30 MED ORDER — HYDROCHLOROTHIAZIDE 25 MG PO TABS
25.0000 mg | ORAL_TABLET | ORAL | Status: DC
  Administered 2022-12-01: 25 mg via ORAL
  Filled 2022-11-30 (×2): qty 1

## 2022-11-30 MED ORDER — ATORVASTATIN CALCIUM 80 MG PO TABS
80.0000 mg | ORAL_TABLET | Freq: Every day | ORAL | Status: DC
  Administered 2022-11-30 – 2022-12-01 (×2): 80 mg via ORAL
  Filled 2022-11-30 (×2): qty 1

## 2022-11-30 MED ORDER — INSULIN ASPART 100 UNIT/ML IJ SOLN
0.0000 [IU] | Freq: Three times a day (TID) | INTRAMUSCULAR | Status: DC
  Administered 2022-11-30: 3 [IU] via SUBCUTANEOUS
  Administered 2022-12-01: 8 [IU] via SUBCUTANEOUS
  Administered 2022-12-01: 3 [IU] via SUBCUTANEOUS
  Administered 2022-12-02: 2 [IU] via SUBCUTANEOUS

## 2022-11-30 MED ORDER — HYDRALAZINE HCL 25 MG PO TABS: 25.0000 mg | ORAL_TABLET | ORAL | Status: DC | PRN

## 2022-11-30 MED ORDER — RANOLAZINE ER 500 MG PO TB12
1000.0000 mg | ORAL_TABLET | Freq: Two times a day (BID) | ORAL | Status: DC
  Administered 2022-11-30 – 2022-12-02 (×4): 1000 mg via ORAL
  Filled 2022-11-30 (×4): qty 2

## 2022-11-30 MED ORDER — PREDNISONE 5 MG PO TABS: 5.0000 mg | ORAL_TABLET | Freq: Every day | ORAL | Status: DC

## 2022-11-30 MED ORDER — PREDNISONE 5 MG (21) PO TBPK: 5.0000 mg | ORAL_TABLET | ORAL | Status: DC

## 2022-11-30 MED ORDER — AMLODIPINE BESYLATE 5 MG PO TABS
5.0000 mg | ORAL_TABLET | Freq: Every day | ORAL | Status: DC
  Administered 2022-12-01: 5 mg via ORAL
  Filled 2022-11-30 (×2): qty 1

## 2022-11-30 MED ORDER — CLOPIDOGREL BISULFATE 75 MG PO TABS
75.0000 mg | ORAL_TABLET | Freq: Every day | ORAL | Status: DC
  Administered 2022-11-30 – 2022-12-02 (×3): 75 mg via ORAL
  Filled 2022-11-30 (×3): qty 1

## 2022-11-30 MED ORDER — HEPARIN BOLUS VIA INFUSION
4000.0000 [IU] | Freq: Once | INTRAVENOUS | Status: DC
  Filled 2022-11-30: qty 4000

## 2022-11-30 MED ORDER — ASPIRIN 81 MG PO CHEW
324.0000 mg | CHEWABLE_TABLET | Freq: Once | ORAL | Status: AC
  Administered 2022-11-30: 324 mg via ORAL
  Filled 2022-11-30: qty 4

## 2022-11-30 MED ORDER — ACETAMINOPHEN 325 MG PO TABS: 650.0000 mg | ORAL_TABLET | Freq: Four times a day (QID) | ORAL | Status: DC | PRN

## 2022-11-30 MED ORDER — PREDNISONE 5 MG PO TABS
15.0000 mg | ORAL_TABLET | Freq: Every day | ORAL | Status: AC
  Administered 2022-12-02: 15 mg via ORAL
  Filled 2022-11-30: qty 1

## 2022-11-30 MED ORDER — PREDNISONE 20 MG PO TABS
20.0000 mg | ORAL_TABLET | Freq: Once | ORAL | Status: AC
  Administered 2022-11-30: 20 mg via ORAL
  Filled 2022-11-30: qty 1

## 2022-11-30 MED ORDER — PREDNISONE 10 MG PO TABS
10.0000 mg | ORAL_TABLET | Freq: Every day | ORAL | Status: DC
  Filled 2022-11-30: qty 1

## 2022-11-30 NOTE — Progress Notes (Signed)
Remote pacemaker transmission.   

## 2022-11-30 NOTE — Consult Note (Addendum)
Cardiology Consultation   Patient ID: Robert Burns MRN: [id number]; DOB: [birth year]  Admit date: 11/30/2022 Date of Consult: 11/30/2022  PCP:  [name], MD (Inactive)   [facility] Providers Cardiologist:  Nanetta Batty, MD  Cardiology APP:  Marcelino Duster, [name]  Electrophysiologist:  Thurmon Fair, MD  {  Patient Profile:   Robert Burns is a 82 y.o. male with a hx of coronary artery disease, status post CABG in 2004, (LIMA to LAD, SVG to diagonal 1 SVG to RPDA, SVG to OM1 and OM 2, PCI with drug-eluting stent to the proximal SVG and PDA in 2016, staged PCI of the SVG to diagonal and November 2016, PAF on Xarelto, hyperlipidemia, OSA on CPAP, PAD status post left SFA PTA/bare-metal stent in 2004, L EIA stent in 2017, L SFA short segment occlusion with staged PTA and stenting in 2017, pacemaker 2007 with change out in 2017 (Medtronic Adapta L ADD RL 1) in the setting of sick sinus syndrome, type 2 diabetes, possible TIA October 2023, CLL  who is being seen 11/30/2022 for the evaluation of NSTEMI at the request of Dr. Maple Hudson.  History of Present Illness:   Mr. Robert Burns has extensive coronary artery disease and vascular disease dating all the way back to 2004 when he had his CABG.  Since then he has had multiple revascularization procedures as listed above.  His last cardiac catheterization was in March 2022 where he had stenting done to his SVG to diagonal graft and PTCA to the areas of restenosis.  Also had PTCA to the proximal SVG to RCA due to restent stenosis.  Severe calcium was noted on catheterization.  If he had recurrent stenosis shockwave IVL was considered.  He had a normal Myoview later in August 2022 that showed intermediate risk and suggesting peri-infarct and small amount of ischemia however read to be normal per MD.  Echo throughout the years has been preserved.  However Lexiscan in August 2022 assessed EF to be around 30 to 44%.  Since that time patient's had  ongoing complaints of intermittent unstable angina throughout this year.  He was last seen by our office earlier this month on the 13th when he had been complaining of more chest discomfort with radiation to his neck.  Given his multiple comorbid issues such as anemia with history of multiple iron, chronic deconditioning, CLL, a conservative approach was taken to uptitrate his antianginals.  His Imdur was increased to 90 mg, amlodipine decreased to 5 mg, and continuation of his Ranexa 1000 mg twice daily. He's been on Plavix chronically and also Xarelto for his PAF.  His last device check was earlier this month on the 6 that noted he was not pacemaker dependent and he had low prevalence of atrial fibrillation.  Currently patient is presenting again today due to recurrent complaints of unstable angina.  He states that after the office visit he had done good and did not have any complaints of chest pain for about a week.  However after going to one of his appointments he started to have multiple episodes of chest pain worse than prior events.  Overall he had taken 3 nitroglycerin with relief.  Previously he had stated that he had been taking the nitroglycerin somewhat erratically as he has had some days/weeks where he has not needed it however some days he takes multiple.  He states that with his episodes he has chest pain with radiation to his neck but no accompanied symptoms such  as nausea, vomiting, shortness of breath, diaphoresis.  He states that these episodes generally occur in the car but does not have these complaints during his water aerobic exercises when he works out at [employer] or with exertion.  His daughter is with him today who expresses significant concern and feels frustrated that he has not had a cardiac catheterization yet.  She states that patient downplays his symptoms and believes that they are worse than what he states.  Additionally, she states that he is followed by [facility] and he is  currently being worked up for his IDA with concerns of acute bleeding per daughter. He has required multiple iron transfusions.  He has had some stool changes however no frank red blood in his urine or stool.  Still awaiting fecal occult.  Denies any abdominal pain, however tender to palpation.  She states that he has an appointment next week to address these concerns.  Not having chest pain currently.  EKG showing normal sinus rhythm, heart rate 79.  Mild ST depressions in lateral leads.  Troponin [id number].  Chest x-ray showing minimal left basilar atelectasis or scar.  Hemoglobin 9.6, hematocrit 33.5.  Past Medical History:  Diagnosis Date   Arthritis    "all over" (11/25/2015)   CAD (coronary artery disease)    a. s/p CABG  06/2002; b. 02/01/15 PCI: DES to prox SVG to PDA, staged PCI of SVG to Diag in 02/2015; c. 04/2017 Cath/PCI: LM nl, LAD 100ost, 61m, 75d, LCX 60ost, OM2 80, RCA 100ost, RPDA 80, LIMA->LAD ok, VG->D1 patent stent, VG->RPDA patent stent, 50p, VG->OM1->OM2 90p (3.0x24 Synergy DES), 100 between OM1->OM2 (med rx).   Cancer [facility])    Right Shoulder, Left Leg- BCC, SCC, AND MELANOMA   Epithelioid hemangioendothelioma    s/p resection of left SFA/mass with interposition 6 mm GoreTex graft 06/02/10, s/p repeat resection for positive margins 09/22/10   Hyperlipidemia    Hypertension    Leg pain    OSA on CPAP    PAD (peripheral artery disease) (HCC)    a. 10/2002 L SFA PTA/BMS; b. 8/17 LE Angio: LEIA 90 (9x40 self exp stent), LSFA short segment prox occlusion (staged PTA/stenting 01/03/2016), patent mid stent, RSFA 73m (staged PTA/DEB 02/14/2016).   Presence of permanent cardiac pacemaker    Medtronic   SSS (sick sinus syndrome) (HCC)    a. s/p PPM in 2007 with gen change 04/2015 - Medtronic Adapta ADDRL1, ser # [id number].   Type II diabetes mellitus (HCC)    Type II   Urgency of urination     Past Surgical History:  Procedure Laterality Date   CARDIAC CATHETERIZATION N/A 02/01/2015    Procedure: Left Heart Cath and Coronary Angiography;  Surgeon: Runell Gess, MD;  Location: [facility] INVASIVE CV LAB;  Service: Cardiovascular;  Laterality: N/A;   CARDIAC CATHETERIZATION N/A 02/01/2015   Procedure: Coronary Stent Intervention;  Surgeon: Runell Gess, MD;  Location: [facility] INVASIVE CV LAB;  Service: Cardiovascular;  Laterality: N/A;   CARDIAC CATHETERIZATION  06/2002   "just before bypass OR"   CARDIAC CATHETERIZATION N/A 03/01/2015   Procedure: Coronary Stent Intervention;  Surgeon: Runell Gess, MD;  Location: [facility] INVASIVE CV LAB;  Service: Cardiovascular;  Laterality: N/A;   CARDIAC CATHETERIZATION  02/06/2018   COLONOSCOPY     CORONARY ANGIOPLASTY     CORONARY ARTERY BYPASS GRAFT  06/2002   x5, LIMA-LAD;VG- Diag; seq VG- ramus & OM branch; VG-PDA   CORONARY STENT INTERVENTION N/A 04/12/2017  Procedure: CORONARY STENT INTERVENTION;  Surgeon: Runell Gess, MD;  Location: [facility] INVASIVE CV LAB;  Service: Cardiovascular;  Laterality: N/A;   CORONARY STENT INTERVENTION N/A 02/08/2018   Procedure: CORONARY STENT INTERVENTION;  Surgeon: Corky Crafts, MD;  Location: [facility] INVASIVE CV LAB;  Service: Cardiovascular;  Laterality: N/A;   CORONARY STENT INTERVENTION N/A 11/12/2018   Procedure: CORONARY STENT INTERVENTION;  Surgeon: Iran Ouch, MD;  Location: [facility] INVASIVE CV LAB;  Service: Cardiovascular;  Laterality: N/A;   CORONARY STENT INTERVENTION N/A 06/22/2020   Procedure: CORONARY STENT INTERVENTION;  Surgeon: Corky Crafts, MD;  Location: [facility] INVASIVE CV LAB;  Service: Cardiovascular;  Laterality: N/A;   EP IMPLANTABLE DEVICE N/A 04/23/2015   Procedure: PPM Generator Changeout;  Surgeon: Thurmon Fair, MD;  Location: [facility] INVASIVE CV LAB;  Service: Cardiovascular;  Laterality: N/A;   FALSE ANEURYSM REPAIR Left 11/29/2018   Procedure: REPAIR FALSE ANEURYSM LEFT RADIAL ARTERY;  Surgeon: Larina Earthly, MD;  Location: [facility] OR;  Service: Vascular;  Laterality: Left;    FEMORAL ARTERY STENT Left ~ 2014   "taken out of my leg; couldn' catorgorize what kind so the put it under all 3"; cataroziepitheloid hemanioendotheliomau   FOOT FRACTURE SURGERY Left 1973   FRACTURE SURGERY     INSERT / REPLACE / REMOVE PACEMAKER  10/13/05   right side, medtronic Adapta   KNEE HARDWARE REMOVAL Right 1950's   "3-4 months after the insertion"   KNEE SURGERY Right 1950's   "broke my lower leg; had to put pin in my knee to keep lower leg in place til it healed"   LEFT HEART CATH AND CORS/GRAFTS ANGIOGRAPHY N/A 04/12/2017   Procedure: LEFT HEART CATH AND CORS/GRAFTS ANGIOGRAPHY;  Surgeon: Runell Gess, MD;  Location: [facility] INVASIVE CV LAB;  Service: Cardiovascular;  Laterality: N/A;   LEFT HEART CATH AND CORS/GRAFTS ANGIOGRAPHY N/A 02/06/2018   Procedure: LEFT HEART CATH AND CORS/GRAFTS ANGIOGRAPHY;  Surgeon: Lennette Bihari, MD;  Location: [facility] INVASIVE CV LAB;  Service: Cardiovascular;  Laterality: N/A;   LEFT HEART CATH AND CORS/GRAFTS ANGIOGRAPHY N/A 11/12/2018   Procedure: LEFT HEART CATH AND CORS/GRAFTS ANGIOGRAPHY;  Surgeon: Iran Ouch, MD;  Location: [facility] INVASIVE CV LAB;  Service: Cardiovascular;  Laterality: N/A;   LEFT HEART CATH AND CORS/GRAFTS ANGIOGRAPHY N/A 06/22/2020   Procedure: LEFT HEART CATH AND CORS/GRAFTS ANGIOGRAPHY;  Surgeon: Corky Crafts, MD;  Location: [facility] INVASIVE CV LAB;  Service: Cardiovascular;  Laterality: N/A;   PERIPHERAL VASCULAR CATHETERIZATION N/A 11/25/2015   Procedure: Lower Extremity Angiography;  Surgeon: Runell Gess, MD;  Location: [facility] INVASIVE CV LAB;  Service: Cardiovascular;  Laterality: N/A;   PERIPHERAL VASCULAR CATHETERIZATION Left 11/25/2015   Procedure: Peripheral Vascular Intervention;  Surgeon: Runell Gess, MD;  Location: [facility] INVASIVE CV LAB;  Service: Cardiovascular;  Laterality: Left;  external iliac   PERIPHERAL VASCULAR CATHETERIZATION N/A 01/03/2016   Procedure: Lower Extremity Angiography;  Surgeon: Runell Gess, MD;  Location: [facility] INVASIVE CV LAB;  Service: Cardiovascular;  Laterality: N/A;   PERIPHERAL VASCULAR CATHETERIZATION Left 01/03/2016   Procedure: Peripheral Vascular Intervention;  Surgeon: Runell Gess, MD;  Location: [facility] INVASIVE CV LAB;  Service: Cardiovascular;  Laterality: Left;  SFA   PERIPHERAL VASCULAR CATHETERIZATION Right 02/14/2016   Procedure: Peripheral Vascular Atherectomy;  Surgeon: Runell Gess, MD;  Location: [facility] INVASIVE CV LAB;  Service: Cardiovascular;  Laterality: Right;  SFA   POPLITEAL ARTERY STENT  01/03/2016   Contralateral access with  a 7 [name] crossover sheath (second order catheter placement)   TONSILLECTOMY AND ADENOIDECTOMY     TUMOR EXCISION Right ~ 2005   cancerous tumor removed from shoulder   TUMOR EXCISION Right ~ 2000   benign tumor removed from under shoulder   TUMOR EXCISION Left 06/02/2010   resection of Lt SFA wth interposition of Gore-Tex graft     Inpatient Medications: Scheduled Meds:  Continuous Infusions:  PRN Meds:   Allergies:    Allergies  Allergen Reactions   Peanut-Containing Drug Products Anaphylaxis and Other (See Comments)    Tongue swelling is severe    Ace Inhibitors Cough        Diltiazem Hcl Other (See Comments)    UNSPECIFIED REACTION     Meloxicam Other (See Comments)    GI upset    Doxycycline Other (See Comments) and Cough    Reaction of cough and runny nose   Eliquis [Apixaban] Other (See Comments)    dizziness   Sertraline Hcl Other (See Comments)   Carvedilol Itching   Clonidine Hcl Other (See Comments)    Patch only - skin irritation   Duloxetine Hcl Other (See Comments)    Urinary frequency    Social History:   Social History   Socioeconomic History   Marital status: Married    Spouse name: Not on file   Number of children: Not on file   Years of education: Not on file   Highest education level: Not on file  Occupational History   Not on file  Tobacco Use   Smoking status:  Former    Types: Pipe    Quit date: 64    Years since quitting: 51.6   Smokeless tobacco: Never   Tobacco comments:    "quit smoking in 1973"  Vaping Use   Vaping status: Never Used  Substance and Sexual Activity   Alcohol use: No   Drug use: No   Sexual activity: Not Currently  Other Topics Concern   Not on file  Social History Narrative   Not on file   Social Determinants of Health   Financial Resource Strain: Not on file  Food Insecurity: No Food Insecurity (09/07/2021)   Received from [facility], [facility]   Hunger Vital Sign    Worried About Running Out of Food in the Last Year: Never true    Ran Out of Food in the Last Year: Never true  Transportation Needs: Not on file  Physical Activity: Not on file  Stress: Not on file  Social Connections: Unknown (08/18/2021)   [facility] from [facility], [facility]   Social Network    Social Network: Not on file  Intimate Partner Violence: Not At Risk (09/10/2022)   Received from [facility], [facility]   HITS    Over the last 12 months how often did your partner physically hurt you?: 1    Over the last 12 months how often did your partner insult you or talk down to you?: 1    Over the last 12 months how often did your partner threaten you with physical harm?: 1    Over the last 12 months how often did your partner scream or curse at you?: 1    Family History:   Family History  Problem Relation Age of Onset   Coronary artery disease Mother    Heart attack Mother    Hypertension Mother    Heart disease Mother        Open  Heart surgery   Diabetes Father    Heart disease Father    Hyperlipidemia Father    Heart attack Father    Hypertension Sister    Diabetes Sister    Heart disease Sister      ROS:  Please see the history of present illness.  All other ROS reviewed and negative.     Physical Exam/Data:   Vitals:   11/30/22 1000 11/30/22 1030 11/30/22 1130 11/30/22 1215  BP: (!) 140/60  134/62  135/62  Pulse: 60 60 62 (!) 59  Resp: (!) 21 20 12 18   Temp:      TempSrc:      SpO2: 100% 100% 100% 100%  Weight:      Height:       No intake or output data in the 24 hours ending 11/30/22 1308    11/30/2022    9:15 AM 11/28/2022   10:05 AM 11/21/2022   10:44 AM  Last 3 Weights  Weight (lbs) 163 lb 170 lb 169 lb 6.4 oz  Weight (kg) 73.936 kg 77.111 kg 76.839 kg     Body mass index is 25.53 kg/m.  General:  Well nourished, well developed, in no acute distress HEENT: normal Neck: no JVD Vascular: No carotid bruits; Distal pulses 2+ bilaterally Cardiac:  normal S1, S2; RRR; no murmur  Lungs:  clear to auscultation bilaterally, no wheezing, rhonchi or rales  Abd: soft, soft bulging in lower rib space Ext: no edema Musculoskeletal:  No deformities, BUE and BLE strength normal and equal Skin: warm and dry  Neuro:  CNs 2-12 intact, no focal abnormalities noted Psych:  Normal affect   EKG:  The EKG was personally reviewed and demonstrates:  EKG showing normal sinus rhythm, heart rate 79.  Mild ST depressions in lateral leads Telemetry:  Telemetry was personally reviewed and demonstrates: Normal sinus rhythm heart rate 70s  Relevant CV Studies:  Lexiscan 11/12/2020 There was no ST segment deviation noted during stress. The left ventricular ejection fraction is moderately decreased (30-44%). Nuclear stress EF: 37%. Defect 1: There is a small defect of mild severity present in the basal inferior and mid inferior location. Findings consistent with prior myocardial infarction with peri-infarct ischemia. This is an intermediate risk study.   1. Partially reversible basal to mid inferior perfusion defect, consistent with infarct with peri-infarct ischemia. 2. Intermediate risk study due to moderate systolic dysfunction (37%).  Amount of ischemia is small  Echocardiogram 06/22/2020 1. Left ventricular ejection fraction, by estimation, is 55 to 60%. The  left ventricle has normal  function. The left ventricle demonstrates  regional wall motion abnormalities (see scoring diagram/findings for  description). There is mild concentric left  ventricular hypertrophy. Left ventricular diastolic parameters are  consistent with Grade II diastolic dysfunction (pseudonormalization).  Elevated left atrial pressure. There is mild hypokinesis of the left  ventricular, basal inferior wall.   2. Right ventricular systolic function is normal. The right ventricular  size is normal. There is normal pulmonary artery systolic pressure. The  estimated right ventricular systolic pressure is 30.1 mmHg.   3. Left atrial size was moderately dilated.   4. Right atrial size was mildly dilated.   5. The mitral valve is normal in structure. No evidence of mitral valve  regurgitation. No evidence of mitral stenosis.   6. The aortic valve is normal in structure. Aortic valve regurgitation is  not visualized. No aortic stenosis is present.   7. The inferior vena cava is normal in  size with greater than 50%  respiratory variability, suggesting right atrial pressure of 3 mmHg.   Comparison(s): No significant change from prior study. Prior images  reviewed side by side. The left ventricular function is unchanged. The  left ventricular wall motion abnormality is unchanged.   Left heart catheterization 06/22/2020 Mid LM lesion is 50% stenosed. Ost LAD to Prox LAD lesion is 100% stenosed. LIMA to LAD is patent. Distal LAD stent appears better compared to prior study in 2020. Ost Cx lesion is 40% stenosed. Prox Cx lesion is 30% stenosed. Ost 3rd Mrg to 3rd Mrg lesion is 99% stenosed. SVG to OM is occluded. Mid Cx lesion is 75% stenosed. Ost Ramus lesion is 95% stenosed. Areas of in stent restenosis in the SVG to diag graft: Prox Graft lesion is 70% stenosed, Mid Graft to Dist Graft lesion is 70% stenosed. Scoring balloon angioplasty was performed using a BALLOON WOLVERINE 3.00X10. Post intervention,  there is a 0% residual stenosis in the distal stent. Post intervention, there is a 30% residual stenosis in the proximal stent. SVG to diagonal Mid Graft lesion is 90% stenosed. A drug-eluting stent was successfully placed using a STENT RESOLUTE ONYX 3.0X12. IC verapamil given prior to stent placement. Post intervention, there is a 0% residual stenosis. Ost RCA to Prox RCA lesion is 100% stenosed. Origin to Prox SVG to PDA Graft lesion is 80% stenosed and scoring Balloon angioplasty was performed, with a 3.25 [place]olver[place] cutting balloon. Post intervention, there is a 25% residual stenosis. The left ventricular systolic function is normal. LV end diastolic pressure is normal. The left ventricular ejection fraction is 50-55% by visual estimate. There is no aortic valve stenosis.   Successful PCI to the mid SVG to diagonal graft and PTCA to areas of restenosis in this graft.    PTCA to the proximal SVG to RCA in stent restenosis.  Severe calcium.  If he has recurrent restenosis, consider Shockwave IVL.    Plavix for 6 months.  Restart Xarelto in AM.   Laboratory Data:  High Sensitivity Troponin:   Recent Labs  Lab 11/30/22 0916 11/30/22 1116  TROPONINIHS 34* 931*     Chemistry Recent Labs  Lab 11/30/22 0803 11/30/22 0916  NA 141 137  K 4.7 4.6  CL 105 104  CO2 23 20*  GLUCOSE 208* 229*  BUN 32* 29*  CREATININE 1.07 1.18  CALCIUM 9.7 9.5  GFRNONAA >60 >60  ANIONGAP 13 13    Recent Labs  Lab 11/30/22 0803  PROT 7.4  ALBUMIN 4.8  AST 20  ALT 16  ALKPHOS 58  BILITOT 0.8   Lipids No results for input(s): "CHOL", "TRIG", "HDL", "LABVLDL", "LDLCALC", "CHOLHDL" in the last 168 hours.  Hematology Recent Labs  Lab 11/30/22 0803 11/30/22 0916  WBC 33.8* 32.6*  RBC 4.38 4.31  HGB 9.9* 9.6*  HCT 34.7* 33.5*  MCV 79.2* 77.7*  MCH 22.6* 22.3*  MCHC 28.5* 28.7*  RDW 18.7* 18.5*  PLT 212 175   Thyroid No results for input(s): "TSH", "FREET4" in the last 168 hours.   BNPNo results for input(s): "BNP", "PROBNP" in the last 168 hours.  DDimer No results for input(s): "DDIMER" in the last 168 hours.   Radiology/Studies:  DG Chest Portable 1 View  Result Date: 11/30/2022 CLINICAL DATA:  Chest pain EXAM: PORTABLE CHEST 1 VIEW COMPARISON:  X-ray 02/05/2022 FINDINGS: Sternal wires. Right upper chest battery pack with pacemaker leads along the right side of the heart are similar to previous. Minimal  left basilar atelectasis. No consolidation, pneumothorax, effusion or edema. Calcified aorta. Overlapping cardiac leads. Degenerative changes of the spine and shoulders. IMPRESSION: Postop chest.  Pacemaker.  Minimal left basilar atelectasis or scar Electronically Signed   By: Karen Kays M.D.   On: 11/30/2022 10:12     Assessment and Plan:   NSTEMI CAD status post CABG with multiple revascularizations Has had ongoing unstable angina for several months with attempt to medically manage.  States hes had chest pain with radiation to his neck that is nonexertional and relieved by nitroglycerin.  EKG showing mild ST depressions in lateral leads.  Troponins 34-931.  No chest pain currently. Will proceed with cath.  Start IV heparin, will give aspirin load, on Lopressor 100 mg twice daily.   Will check echocardiogram, lipid.  Lexiscan in 2022 indicated reduced EF.  Prior echocardiograms were normal though.. Chronically on Plavix, Ranexa 1000 mg, Imdur 90 mg.  Paroxysmal atrial fibrillation Normal sinus rhythm today.  Device check this month showed low burden.  Continue with Lopressor 100 mg twice daily twice daily.  Will hold Xarelto anticipation for cardiac catheterization  Sick sinus syndrome status post pacemaker Normal device function.  Stable.  PAD No symptoms of claudication currently.  Status post multiple procedures.  HLD LDL 38 1 year ago.  Will repeat. Continue atorvastatin 80 mg.  Hypertension PTA amlodipine 5 mg, hydralazine 25 mg, HCTZ 25 mg,  Lopressor 100 mg.  CLL Iron deficiency anemia Followed by oncology and [facility] with need for multiple iron infusions.  Has follow-up appointment next week to further evaluate this. Discussed with MD hemoglobin stable to proceed with cath.   Type 2 diabetes mellitus Metformin around catheterization.  May continue 48 hours after catheterization.  OSA on CPAP  Informed Consent   Shared Decision Making/Informed Consent The risks [stroke (1 in 1000), death (1 in 1000), kidney failure [usually temporary] (1 in 500), bleeding (1 in 200), allergic reaction [possibly serious] (1 in 200)], benefits (diagnostic support and management of coronary artery disease) and alternatives of a cardiac catheterization were discussed in detail with Mr. Rothwell and he is willing to proceed.        Risk Assessment/Risk Scores:   The patient's TIMI risk score is 7, which indicates a 41% risk of all cause mortality, new or recurrent myocardial infarction or need for urgent revascularization in the next 14 days.{   CHA2DS2-VASc Score = 7  This indicates a 11.2% annual risk of stroke. The patient's score is based upon: CHF History: 0 HTN History: 1 Diabetes History: 1 Stroke History: 2 Vascular Disease History: 1 Age Score: 2 Gender Score: 0     For questions or updates, please contact [facility] Please consult [email or web address] for contact info under    Signed, Abagail Kitchens, PA-C  11/30/2022 1:08 PM    Pt. Seen and examined. Agree with the Resident/NP/PA-C note as written. Robert Burns is a 82 y.o. male who is being seen today for the evaluation of NSTEMI at the request of Dr. Maple Hudson. This is a pleasant 82 year old male with a complex history of coronary artery disease status post CABG in 2004 and subsequent PCI in 2016.  He also has PAF on Xarelto, dyslipidemia, obstructive sleep apnea, PAD and is status post permanent pacemaker for sick sinus syndrome.  He was also diagnosed with CLL and  recently has had some degree of iron deficiency anemia receiving IV iron.  He has been seen in the office twice within  the past several months for chest pain.  Initially was felt to be atypical however more recently concerning for accelerating stable angina.  Today presented after the most intense chest pain that he is ever had which required for nitroglycerin to lead to some relief.  He reports being seen recently with issues for his knee and was started on a steroid taper.  He had taken 60 mg of prednisone.  He was apparently referred to GI to look for possible GI source of microcytic anemia.  EKG today showed mild ST depressions in the lateral leads.  Initial troponin was 34 but subsequent troponin rose to 931.  He reports minimal chest discomfort at this time.  Hemoglobin 9.6 hematocrit 33.5.  Physical exam he is awake, sitting upright, appears pale but no acute distress, heart regular rate and rhythm, lungs clear, abdomen soft, nontender, extremities without edema, awake and orient x 3, follows commands, psych: Pleasant.  This is an 82 year old male with multiple medical problems including CABG in 2004 and subsequent PCI in 2016.  He now presents with the most severe chest discomfort he has had and has ruled in for NSTEMI with troponin over 900.  I suspect it will go higher and we will trend that.  Would recommend starting IV heparin.  He is on Xarelto which was last dose last evening.  Hopefully if we washed that out he might be a candidate for cardiac catheterization late tomorrow afternoon otherwise it would likely be on Monday.  He is also on Plavix but not aspirin.  Will continue Plavix.  Monitor for signs or symptoms of bleeding.  Thanks for the consultation.  Given the patient's multiple other noncardiac issues, would request hospital medicine admission.  Time Spent with Patient: I have spent a total of 45 minutes with patient reviewing hospital notes, telemetry, EKGs, labs and examining the  patient as well as establishing an assessment and plan that was discussed with the patient.  > 50% of time was spent in direct patient care.  Chrystie Nose, MD, [facility], FACP  [facility]  Medical Director of the [facility] Lipid [facility]  Cardiovascular Risk Reduction Clinic Diplomate of the American Board of Clinical Lipidology Attending Cardiologist  Direct Dial: [phone]  Fax: [phone]  Website:  [email or web address]

## 2022-11-30 NOTE — ED Provider Notes (Signed)
[place] EMERGENCY DEPARTMENT AT [facility] Provider Note   CSN: [id number] Arrival date & time: 11/30/22  1607     History  Chief Complaint  Patient presents with   Chest Pain    Robert Burns is a 82 y.o. male.  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  Central pain, radiating to neck.  Patient has been having increasing episodes of chest pain over the past several months.  He has taken 4 nitros this morning.  Reports that he is finally getting some relief after the fourth nitro.  He is not having any shortness of breath.  No lower extremity edema.        Home Medications Prior to Admission medications   Medication Sig Start Date End Date Taking? Authorizing Provider  amLODipine (NORVASC) 5 MG tablet Take 1 tablet (5 mg total) by mouth daily. 11/21/22 02/19/23 Yes Jodelle Gross, NP  atorvastatin (LIPITOR) 80 MG tablet Take 1 tablet (80 mg total) by mouth daily at 6 PM. 02/09/18  Yes Janetta Hora, PA-C  clopidogrel (PLAVIX) 75 MG tablet Take 1 tablet (75 mg total) by mouth daily with breakfast. 06/24/20  Yes Duke, Roe Rutherford, PA  dorzolamide-timolol (COSOPT) 22.3-6.8 MG/ML ophthalmic solution Place 1 drop into both eyes daily. 04/30/20  Yes [provider]  glipiZIDE (GLUCOTROL) 5 MG tablet Take 5 mg by mouth daily with breakfast.  01/10/18  Yes [provider]  hydrALAZINE (APRESOLINE) 25 MG tablet Take 1 tablet (25 mg total) by mouth as needed (for BP >150 sys or 90 dia). 09/23/20  Yes Cleaver, Thomasene Ripple, NP  hydrochlorothiazide (HYDRODIURIL) 25 MG tablet Take 25 mg by mouth every morning.   Yes [provider]  isosorbide mononitrate (IMDUR) 60 MG 24 hr tablet Take 1.5 tablets (90 mg total) by mouth daily. 11/21/22 02/19/23 Yes Jodelle Gross, NP  melatonin 5 MG TABS Take 5 mg by mouth at bedtime.   Yes [provider]  metFORMIN (GLUCOPHAGE) 1000 MG tablet  Take 1 tablet (1,000 mg total) by mouth 2 (two) times daily with a meal. Restart on 3/18. 06/23/20  Yes Duke, Roe Rutherford, PA  metoprolol tartrate (LOPRESSOR) 100 MG tablet Take 1 tablet (100 mg total) by mouth 2 (two) times daily. 08/07/17  Yes Jodelle Gross, NP  MULTIPLE VITAMIN-FOLIC ACID PO Take 1 tablet by mouth daily.   Yes [provider]  nitroGLYCERIN (NITROSTAT) 0.4 MG SL tablet Place 1 tablet (0.4 mg total) under the tongue every 5 (five) minutes as needed for chest pain. 11/21/22  Yes Jodelle Gross, NP  potassium chloride SA (K-DUR,KLOR-CON) 20 MEQ tablet Take 1 tablet (20 mEq total) by mouth daily. 02/18/16  Yes Regalado, Belkys A, MD  predniSONE (STERAPRED UNI-PAK 21 TAB) 5 MG (21) TBPK tablet Take 5 mg by mouth See admin instructions. follow package directions 11/27/22  Yes [provider]  ranolazine (RANEXA) 1000 MG SR tablet Take 1 tablet (1,000 mg total) by mouth 2 (two) times daily. 08/30/22  Yes Ronney Asters, NP  tamsulosin (FLOMAX) 0.4 MG CAPS capsule Take 0.4 mg by mouth 2 (two) times daily.  03/09/15  Yes [provider]  XARELTO 20 MG TABS tablet TAKE 1 TABLET EVERY DAY WITH SUPPER 09/11/22  Yes Croitoru, Mihai, MD      Allergies    Peanut-containing drug products, Ace inhibitors, Diltiazem hcl, Meloxicam, Doxycycline, Eliquis [apixaban], Sertraline hcl, Carvedilol, Clonidine hcl, and Duloxetine  hcl    Review of Systems   Review of Systems  Physical Exam Updated Vital Signs BP (!) 142/74   Pulse 75   Temp 98.2 F (36.8 C) (Oral)   Resp 20   Ht 5\' 7"  (1.702 m)   Wt 73.9 kg   SpO2 99%   BMI 25.53 kg/m  Physical Exam Vitals reviewed.  Constitutional:      General: He is not in acute distress.    Appearance: He is not toxic-appearing.  HENT:     Head: Normocephalic.     Nose: Nose normal.     Mouth/Throat:     Mouth: Mucous membranes are moist.  Eyes:     Conjunctiva/sclera: Conjunctivae normal.  Cardiovascular:      Rate and Rhythm: Normal rate and regular rhythm.  Pulmonary:     Effort: Pulmonary effort is normal.     Breath sounds: Normal breath sounds.  Abdominal:     General: Abdomen is flat. There is no distension.     Tenderness: There is no abdominal tenderness. There is no guarding or rebound.  Musculoskeletal:     Right lower leg: No edema.     Left lower leg: No edema.  Skin:    General: Skin is warm.     Capillary Refill: Capillary refill takes less than 2 seconds.  Neurological:     Mental Status: He is alert and oriented to person, place, and time.  Psychiatric:        Mood and Affect: Mood normal.     ED Results / Procedures / Treatments   Labs (all labs ordered are listed, but only abnormal results are displayed) Labs Reviewed  BASIC METABOLIC PANEL - Abnormal; Notable for the following components:      Result Value   CO2 20 (*)    Glucose, Bld 229 (*)    BUN 29 (*)    All other components within normal limits  CBC - Abnormal; Notable for the following components:   WBC 32.6 (*)    Hemoglobin 9.6 (*)    HCT 33.5 (*)    MCV 77.7 (*)    MCH 22.3 (*)    MCHC 28.7 (*)    RDW 18.5 (*)    All other components within normal limits  GLUCOSE, CAPILLARY - Abnormal; Notable for the following components:   Glucose-Capillary 157 (*)    All other components within normal limits  TROPONIN I (HIGH SENSITIVITY) - Abnormal; Notable for the following components:   Troponin I (High Sensitivity) 34 (*)    All other components within normal limits  TROPONIN I (HIGH SENSITIVITY) - Abnormal; Notable for the following components:   Troponin I (High Sensitivity) 931 (*)    All other components within normal limits  HEPARIN LEVEL (UNFRACTIONATED)  APTT  HEMOGLOBIN A1C  OCCULT BLOOD X 1 CARD TO LAB, STOOL    EKG EKG Interpretation Date/Time:  Thursday November 30 2022 09:03:57 EDT Ventricular Rate:  79 PR Interval:  165 QRS Duration:  130 QT Interval:  392 QTC Calculation: 450 R  Axis:   87  Text Interpretation: Sinus rhythm Nonspecific intraventricular conduction delay Anterior infarct, old Minimal ST depression, lateral leads Confirmed by Estanislado Pandy [phone]) on 11/30/2022 9:58:06 AM  Radiology DG Chest Portable 1 View  Result Date: 11/30/2022 CLINICAL DATA:  Chest pain EXAM: PORTABLE CHEST 1 VIEW COMPARISON:  X-ray 02/05/2022 FINDINGS: Sternal wires. Right upper chest battery pack with pacemaker leads along the right side of the heart  are similar to previous. Minimal left basilar atelectasis. No consolidation, pneumothorax, effusion or edema. Calcified aorta. Overlapping cardiac leads. Degenerative changes of the spine and shoulders. IMPRESSION: Postop chest.  Pacemaker.  Minimal left basilar atelectasis or scar Electronically Signed   By: Karen Kays M.D.   On: 11/30/2022 10:12    Procedures Procedures    Medications Ordered in ED Medications  heparin ADULT infusion 100 units/mL (25000 units/231mL) (900 Units/hr Intravenous New Bag/Given 11/30/22 1504)  insulin aspart (novoLOG) injection 0-15 Units (has no administration in time range)  insulin aspart (novoLOG) injection 0-5 Units (has no administration in time range)  perflutren lipid microspheres (DEFINITY) IV suspension (2 mLs Intravenous Given 11/30/22 1525)  insulin glargine-yfgn (SEMGLEE) injection 5 Units (has no administration in time range)  acetaminophen (TYLENOL) tablet 650 mg (has no administration in time range)    Or  acetaminophen (TYLENOL) suppository 650 mg (has no administration in time range)  albuterol (PROVENTIL) (2.5 MG/3ML) 0.083% nebulizer solution 2.5 mg (has no administration in time range)  ondansetron (ZOFRAN) tablet 4 mg (has no administration in time range)    Or  ondansetron (ZOFRAN) injection 4 mg (has no administration in time range)  aspirin chewable tablet 324 mg (324 mg Oral Given 11/30/22 1345)    ED Course/ Medical Decision Making/ A&P Clinical Course as of 11/30/22 1627   Thu Nov 30, 2022  1050 Case discussed with on-call cardiology, they will see patient. [TY]  1239 Troponin I (High Sensitivity)(!!): 931 Reaching back out to cardiology regarding admission and possibly starting heparin for patients NSTEMI [TY]  1626 Cardiology requesting medicine admission, they will handle anticoagulation.  Plan for cath this hospitalization. [TY]  1627 Spoke with hospitalist who agrees to see admit patient. [TY]    Clinical Course User Index [TY] Coral Spikes, DO                                 Medical Decision Making Is an 82 year old male extensive cardiac history presenting emergency department with chest pain.  He is afebrile, hypertensive, nontachycardic maintaining oxygen saturation on room air.  On exam clinically appears well, equal pulses throughout and clear lungs.  Per chart review was seen by a nurse practitioner on 8/13 who wanted to medically manage his symptoms.  Will get cardiac workup, and plan to talk with cardiologist regarding patient's unstable angina.  Amount and/or Complexity of Data Reviewed Independent Historian:     Details: Daughter notes that patient has never had a positive stress test despite severe multivessel disease and multiple stents. Labs: ordered. Decision-making details documented in ED Course. Radiology: ordered. Decision-making details documented in ED Course.  Risk Decision regarding hospitalization.          Final Clinical Impression(s) / ED Diagnoses Final diagnoses:  NSTEMI (non-ST elevated myocardial infarction) [facility])    Rx / DC Orders ED Discharge Orders     None         Coral Spikes, DO 11/30/22 1627

## 2022-11-30 NOTE — ED Notes (Signed)
ED TO INPATIENT HANDOFF REPORT  ED Nurse Name and Phone #: Einar Grad [phone]  S Name/Age/Gender Robert Burns 82 y.o. male Room/Bed: 027C/027C  Code Status   Code Status: Prior  Home/SNF/Other Home Patient oriented to: self, place, time, and situation Is this baseline? Yes   Triage Complete: Triage complete  Chief Complaint NSTEMI (non-ST elevated myocardial infarction) [facility]) [I21.4]  Triage Note Patient presents from home with midsternal chest pain radiating into neck and bilateral shoulders since approximately 0700 today. Patient reports that he took a total of 4 sublingual nitros PTA with temporary relief of pain. Patient reports that he takes xarelto. Patient is AAOx4; on continuous cardiac and pulse oximetry monitoring. Patient reports that he was at the cancer center getting a blood draw this morning due to history of low iron, low hemoglobin, and leukemia. Patient denies any emesis or bloody stools but endorses that his stool has been darker than usual.    Allergies Allergies  Allergen Reactions   Peanut-Containing Drug Products Anaphylaxis and Other (See Comments)    Tongue swelling is severe    Ace Inhibitors Cough        Diltiazem Hcl Other (See Comments)    UNSPECIFIED REACTION     Meloxicam Other (See Comments)    GI upset    Doxycycline Other (See Comments) and Cough    Reaction of cough and runny nose   Eliquis [Apixaban] Other (See Comments)    dizziness   Sertraline Hcl Other (See Comments)   Carvedilol Itching   Clonidine Hcl Other (See Comments)    Patch only - skin irritation   Duloxetine Hcl Other (See Comments)    Urinary frequency    Level of Care/Admitting Diagnosis ED Disposition     ED Disposition  Admit   Condition  --   Comment  Hospital Area: [facility] [[id number]]  Level of Care: Telemetry Cardiac [103]  May admit patient to [facility] or [facility] if equivalent level of care is available:: No  Covid  Evaluation: Asymptomatic - no recent exposure (last 10 days) testing not required  Diagnosis: NSTEMI (non-ST elevated myocardial infarction) [facility]) [[id number]]  Admitting Physician: Clydie Braun [[id number]]  Attending Physician: Clydie Braun [[id number]]  Certification:: I certify this patient will need inpatient services for at least 2 midnights  Expected Medical Readiness: 12/02/2022          B Medical/Surgery History Past Medical History:  Diagnosis Date   Arthritis    "all over" (11/25/2015)   CAD (coronary artery disease)    a. s/p CABG  06/2002; b. 02/01/15 PCI: DES to prox SVG to PDA, staged PCI of SVG to Diag in 02/2015; c. 04/2017 Cath/PCI: LM nl, LAD 100ost, 54m, 75d, LCX 60ost, OM2 80, RCA 100ost, RPDA 80, LIMA->LAD ok, VG->D1 patent stent, VG->RPDA patent stent, 50p, VG->OM1->OM2 90p (3.0x24 Synergy DES), 100 between OM1->OM2 (med rx).   Cancer [facility])    Right Shoulder, Left Leg- BCC, SCC, AND MELANOMA   Epithelioid hemangioendothelioma    s/p resection of left SFA/mass with interposition 6 mm GoreTex graft 06/02/10, s/p repeat resection for positive margins 09/22/10   Hyperlipidemia    Hypertension    Leg pain    OSA on CPAP    PAD (peripheral artery disease) (HCC)    a. 10/2002 L SFA PTA/BMS; b. 8/17 LE Angio: LEIA 90 (9x40 self exp stent), LSFA short segment prox occlusion (staged PTA/stenting 01/03/2016), patent mid stent, RSFA 42m (staged PTA/DEB 02/14/2016).  Presence of permanent cardiac pacemaker    Medtronic   SSS (sick sinus syndrome) (HCC)    a. s/p PPM in 2007 with gen change 04/2015 - Medtronic Adapta ADDRL1, ser # [id number].   Type II diabetes mellitus (HCC)    Type II   Urgency of urination    Past Surgical History:  Procedure Laterality Date   CARDIAC CATHETERIZATION N/A 02/01/2015   Procedure: Left Heart Cath and Coronary Angiography;  Surgeon: Runell Gess, MD;  Location: [facility] INVASIVE CV LAB;  Service: Cardiovascular;  Laterality: N/A;   CARDIAC  CATHETERIZATION N/A 02/01/2015   Procedure: Coronary Stent Intervention;  Surgeon: Runell Gess, MD;  Location: [facility] INVASIVE CV LAB;  Service: Cardiovascular;  Laterality: N/A;   CARDIAC CATHETERIZATION  06/2002   "just before bypass OR"   CARDIAC CATHETERIZATION N/A 03/01/2015   Procedure: Coronary Stent Intervention;  Surgeon: Runell Gess, MD;  Location: [facility] INVASIVE CV LAB;  Service: Cardiovascular;  Laterality: N/A;   CARDIAC CATHETERIZATION  02/06/2018   COLONOSCOPY     CORONARY ANGIOPLASTY     CORONARY ARTERY BYPASS GRAFT  06/2002   x5, LIMA-LAD;VG- Diag; seq VG- ramus & OM branch; VG-PDA   CORONARY STENT INTERVENTION N/A 04/12/2017   Procedure: CORONARY STENT INTERVENTION;  Surgeon: Runell Gess, MD;  Location: [facility] INVASIVE CV LAB;  Service: Cardiovascular;  Laterality: N/A;   CORONARY STENT INTERVENTION N/A 02/08/2018   Procedure: CORONARY STENT INTERVENTION;  Surgeon: Corky Crafts, MD;  Location: [facility] INVASIVE CV LAB;  Service: Cardiovascular;  Laterality: N/A;   CORONARY STENT INTERVENTION N/A 11/12/2018   Procedure: CORONARY STENT INTERVENTION;  Surgeon: Iran Ouch, MD;  Location: [facility] INVASIVE CV LAB;  Service: Cardiovascular;  Laterality: N/A;   CORONARY STENT INTERVENTION N/A 06/22/2020   Procedure: CORONARY STENT INTERVENTION;  Surgeon: Corky Crafts, MD;  Location: [facility] INVASIVE CV LAB;  Service: Cardiovascular;  Laterality: N/A;   EP IMPLANTABLE DEVICE N/A 04/23/2015   Procedure: PPM Generator Changeout;  Surgeon: Thurmon Fair, MD;  Location: [facility] INVASIVE CV LAB;  Service: Cardiovascular;  Laterality: N/A;   FALSE ANEURYSM REPAIR Left 11/29/2018   Procedure: REPAIR FALSE ANEURYSM LEFT RADIAL ARTERY;  Surgeon: Larina Earthly, MD;  Location: [facility] OR;  Service: Vascular;  Laterality: Left;   FEMORAL ARTERY STENT Left ~ 2014   "taken out of my leg; couldn' catorgorize what kind so the put it under all 3"; cataroziepitheloid hemanioendotheliomau   FOOT FRACTURE  SURGERY Left 1973   FRACTURE SURGERY     INSERT / REPLACE / REMOVE PACEMAKER  10/13/05   right side, medtronic Adapta   KNEE HARDWARE REMOVAL Right 1950's   "3-4 months after the insertion"   KNEE SURGERY Right 1950's   "broke my lower leg; had to put pin in my knee to keep lower leg in place til it healed"   LEFT HEART CATH AND CORS/GRAFTS ANGIOGRAPHY N/A 04/12/2017   Procedure: LEFT HEART CATH AND CORS/GRAFTS ANGIOGRAPHY;  Surgeon: Runell Gess, MD;  Location: [facility] INVASIVE CV LAB;  Service: Cardiovascular;  Laterality: N/A;   LEFT HEART CATH AND CORS/GRAFTS ANGIOGRAPHY N/A 02/06/2018   Procedure: LEFT HEART CATH AND CORS/GRAFTS ANGIOGRAPHY;  Surgeon: Lennette Bihari, MD;  Location: [facility] INVASIVE CV LAB;  Service: Cardiovascular;  Laterality: N/A;   LEFT HEART CATH AND CORS/GRAFTS ANGIOGRAPHY N/A 11/12/2018   Procedure: LEFT HEART CATH AND CORS/GRAFTS ANGIOGRAPHY;  Surgeon: Iran Ouch, MD;  Location: [facility] INVASIVE CV LAB;  Service: Cardiovascular;  Laterality: N/A;   LEFT HEART CATH AND CORS/GRAFTS ANGIOGRAPHY N/A 06/22/2020   Procedure: LEFT HEART CATH AND CORS/GRAFTS ANGIOGRAPHY;  Surgeon: Corky Crafts, MD;  Location: [facility] INVASIVE CV LAB;  Service: Cardiovascular;  Laterality: N/A;   PERIPHERAL VASCULAR CATHETERIZATION N/A 11/25/2015   Procedure: Lower Extremity Angiography;  Surgeon: Runell Gess, MD;  Location: [facility] INVASIVE CV LAB;  Service: Cardiovascular;  Laterality: N/A;   PERIPHERAL VASCULAR CATHETERIZATION Left 11/25/2015   Procedure: Peripheral Vascular Intervention;  Surgeon: Runell Gess, MD;  Location: [facility] INVASIVE CV LAB;  Service: Cardiovascular;  Laterality: Left;  external iliac   PERIPHERAL VASCULAR CATHETERIZATION N/A 01/03/2016   Procedure: Lower Extremity Angiography;  Surgeon: Runell Gess, MD;  Location: [facility] INVASIVE CV LAB;  Service: Cardiovascular;  Laterality: N/A;   PERIPHERAL VASCULAR CATHETERIZATION Left 01/03/2016   Procedure: Peripheral Vascular  Intervention;  Surgeon: Runell Gess, MD;  Location: [facility] INVASIVE CV LAB;  Service: Cardiovascular;  Laterality: Left;  SFA   PERIPHERAL VASCULAR CATHETERIZATION Right 02/14/2016   Procedure: Peripheral Vascular Atherectomy;  Surgeon: Runell Gess, MD;  Location: [facility] INVASIVE CV LAB;  Service: Cardiovascular;  Laterality: Right;  SFA   POPLITEAL ARTERY STENT  01/03/2016   Contralateral access with a 7 [name] crossover sheath (second order catheter placement)   TONSILLECTOMY AND ADENOIDECTOMY     TUMOR EXCISION Right ~ 2005   cancerous tumor removed from shoulder   TUMOR EXCISION Right ~ 2000   benign tumor removed from under shoulder   TUMOR EXCISION Left 06/02/2010   resection of Lt SFA wth interposition of Gore-Tex graft     A IV Location/Drains/Wounds Patient Lines/Drains/Airways Status     Active Line/Drains/Airways     Name Placement date Placement time Site Days   Peripheral IV 11/30/22 20 G Right Antecubital 11/30/22  0920  Antecubital  less than 1            Intake/Output Last 24 hours No intake or output data in the 24 hours ending 11/30/22 1357  Labs/Imaging Results for orders placed or performed during the hospital encounter of 11/30/22 (from the past 48 hour(s))  Basic metabolic panel     Status: Abnormal   Collection Time: 11/30/22  9:16 AM  Result Value Ref Range   Sodium 137 135 - 145 mmol/L   Potassium 4.6 3.5 - 5.1 mmol/L   Chloride 104 98 - 111 mmol/L   CO2 20 (L) 22 - 32 mmol/L   Glucose, Bld 229 (H) 70 - 99 mg/dL    Comment: Glucose reference range applies only to samples taken after fasting for at least 8 hours.   BUN 29 (H) 8 - 23 mg/dL   Creatinine, Ser 4.09 0.61 - 1.24 mg/dL   Calcium 9.5 8.9 - 81.1 mg/dL   GFR, Estimated >91 >47 mL/min    Comment: (NOTE) Calculated using the CKD-EPI Creatinine Equation (2021)    Anion gap 13 5 - 15    Comment: Performed at [facility], [address] [place], [place] [address]  CBC     Status:  Abnormal   Collection Time: 11/30/22  9:16 AM  Result Value Ref Range   WBC 32.6 (H) 4.0 - 10.5 K/uL   RBC 4.31 4.22 - 5.81 MIL/uL   Hemoglobin 9.6 (L) 13.0 - 17.0 g/dL   HCT 21.3 (L) 08.6 - 57.8 %   MCV 77.7 (L) 80.0 - 100.0 fL   MCH 22.3 (L) 26.0 - 34.0 pg  MCHC 28.7 (L) 30.0 - 36.0 g/dL   RDW 02.7 (H) 25.3 - 66.4 %   Platelets 175 150 - 400 K/uL    Comment: REPEATED TO VERIFY   nRBC 0.0 0.0 - 0.2 %    Comment: Performed at [facility], [address], [place], [place] [address]  Troponin I (High Sensitivity)     Status: Abnormal   Collection Time: 11/30/22  9:16 AM  Result Value Ref Range   Troponin I (High Sensitivity) 34 (H) <18 ng/L    Comment: (NOTE) Elevated high sensitivity troponin I (hsTnI) values and significant  changes across serial measurements may suggest ACS but many other  chronic and acute conditions are known to elevate hsTnI results.  Refer to the "Links" section for chest pain algorithms and additional  guidance. Performed at [facility], [address], [place], [place] [address]   Troponin I (High Sensitivity)     Status: Abnormal   Collection Time: 11/30/22 11:16 AM  Result Value Ref Range   Troponin I (High Sensitivity) 931 (HH) <18 ng/L    Comment: CRITICAL RESULT CALLED TO, READ BACK BY AND VERIFIED WITH Manley Mason, RN 1223 11/30/22 L. KLAR (NOTE) Elevated high sensitivity troponin I (hsTnI) values and significant  changes across serial measurements may suggest ACS but many other  chronic and acute conditions are known to elevate hsTnI results.  Refer to the "Links" section for chest pain algorithms and additional  guidance. Performed at [facility], [address], [place], [place] [address]    DG Chest Portable 1 View  Result Date: 11/30/2022 CLINICAL DATA:  Chest pain EXAM: PORTABLE CHEST 1 VIEW COMPARISON:  X-ray 02/05/2022 FINDINGS: Sternal wires. Right upper chest battery pack with pacemaker leads along the right side  of the heart are similar to previous. Minimal left basilar atelectasis. No consolidation, pneumothorax, effusion or edema. Calcified aorta. Overlapping cardiac leads. Degenerative changes of the spine and shoulders. IMPRESSION: Postop chest.  Pacemaker.  Minimal left basilar atelectasis or scar Electronically Signed   By: Karen Kays M.D.   On: 11/30/2022 10:12    Pending Labs Unresulted Labs (From admission, onward)    None       Vitals/Pain Today's Vitals   11/30/22 1215 11/30/22 1314 11/30/22 1315 11/30/22 1357  BP: 135/62  (!) 142/74   Pulse: (!) 59  75   Resp: 18  20   Temp:  98.2 F (36.8 C)    TempSrc:  Oral    SpO2: 100%  99%   Weight:      Height:      PainSc:    0-No pain    Isolation Precautions No active isolations  Medications Medications  heparin bolus via infusion 4,000 Units (has no administration in time range)  heparin ADULT infusion 100 units/mL (25000 units/242mL) (has no administration in time range)  aspirin chewable tablet 324 mg (324 mg Oral Given 11/30/22 1345)    Mobility walks with device (cane or walker)     Focused Assessments Cardiac Assessment Handoff:    Lab Results  Component Value Date   TROPONINI 4.76 (HH) 04/14/2017   No results found for: "DDIMER" Does the Patient currently have chest pain? No    R Recommendations: See Admitting Provider Note  Report given to:   Additional Notes: VSS; family at bedside

## 2022-11-30 NOTE — Progress Notes (Signed)
   11/30/22 2217  BiPAP/CPAP/SIPAP  $ Non-Invasive Home Ventilator  Subsequent  BiPAP/CPAP/SIPAP Pt Type Adult  BiPAP/CPAP/SIPAP Resmed  Mask Type Full face mask  Mask Size Medium  PEEP 13 cmH20  FiO2 (%) 21 %  Patient Home Equipment Yes  Nasal massage performed Yes  CPAP/SIPAP surface wiped down Yes  Safety Check Completed by RT for Home Unit Yes, no issues noted  BiPAP/CPAP /SiPAP Vitals  Pulse Rate 62  Resp 16  BP (!) 143/63  MEWS Score/Color  MEWS Score 0  MEWS Score Color [place] Si

## 2022-11-30 NOTE — Progress Notes (Addendum)
ANTICOAGULATION CONSULT NOTE - Initial Consult  Pharmacy Consult for heparin Indication: chest pain/ACS  Allergies  Allergen Reactions   Peanut-Containing Drug Products Anaphylaxis and Other (See Comments)    Tongue swelling is severe    Ace Inhibitors Cough        Diltiazem Hcl Other (See Comments)    UNSPECIFIED REACTION     Meloxicam Other (See Comments)    GI upset    Doxycycline Other (See Comments) and Cough    Reaction of cough and runny nose   Eliquis [Apixaban] Other (See Comments)    dizziness   Sertraline Hcl Other (See Comments)   Carvedilol Itching   Clonidine Hcl Other (See Comments)    Patch only - skin irritation   Duloxetine Hcl Other (See Comments)    Urinary frequency    Patient Measurements: Height: 5\' 7"  (170.2 cm) Weight: 73.9 kg (163 lb) IBW/kg (Calculated) : 66.1 Heparin Dosing Weight: 73.9 kg  Vital Signs: Temp: 98.2 F (36.8 C) (08/22 1314) Temp Source: Oral (08/22 1314) BP: 142/74 (08/22 1315) Pulse Rate: 75 (08/22 1315)  Labs: Recent Labs    11/30/22 0803 11/30/22 0916 11/30/22 1116  HGB 9.9* 9.6*  --   HCT 34.7* 33.5*  --   PLT 212 175  --   CREATININE 1.07 1.18  --   TROPONINIHS  --  34* 931*    Estimated Creatinine Clearance: 44.3 mL/min (by C-G formula based on SCr of 1.18 mg/dL).   Medical History: Past Medical History:  Diagnosis Date   Arthritis    "all over" (11/25/2015)   CAD (coronary artery disease)    a. s/p CABG  06/2002; b. 02/01/15 PCI: DES to prox SVG to PDA, staged PCI of SVG to Diag in 02/2015; c. 04/2017 Cath/PCI: LM nl, LAD 100ost, 30m, 75d, LCX 60ost, OM2 80, RCA 100ost, RPDA 80, LIMA->LAD ok, VG->D1 patent stent, VG->RPDA patent stent, 50p, VG->OM1->OM2 90p (3.0x24 Synergy DES), 100 between OM1->OM2 (med rx).   Cancer [facility])    Right Shoulder, Left Leg- BCC, SCC, AND MELANOMA   Epithelioid hemangioendothelioma    s/p resection of left SFA/mass with interposition 6 mm GoreTex graft 06/02/10, s/p repeat  resection for positive margins 09/22/10   Hyperlipidemia    Hypertension    Leg pain    OSA on CPAP    PAD (peripheral artery disease) (HCC)    a. 10/2002 L SFA PTA/BMS; b. 8/17 LE Angio: LEIA 90 (9x40 self exp stent), LSFA short segment prox occlusion (staged PTA/stenting 01/03/2016), patent mid stent, RSFA 17m (staged PTA/DEB 02/14/2016).   Presence of permanent cardiac pacemaker    Medtronic   SSS (sick sinus syndrome) (HCC)    a. s/p PPM in 2007 with gen change 04/2015 - Medtronic Adapta ADDRL1, ser # [id number].   Type II diabetes mellitus (HCC)    Type II   Urgency of urination     Medications:  (Not in a hospital admission)  Scheduled:   aspirin  324 mg Oral Once   Infusions:   Assessment: 82 yo male with extensive history or coronary artery disease and vascular disease s/p CABG (2004). Pt is presenting with recurrent unstable angina. Of note, patient is being worked up for potential acute bleeding associated with iron deficiency anemia by GI and has required multiple transfusions. Spoke with pt and family, no active bleeding noted. On Xarelto PTA for for afib (last dose 8/21 at 2000). Troponins 931, hgb 9.6 (iron associated anemia), plt wnl.  Goal of Therapy:  Heparin level 0.3-0.7 units/ml aPTT 66-102 seconds Monitor platelets by anticoagulation protocol: Yes   Plan:  Start heparin 900 units/hr  8 hour anti-Xa level and aPTT until levels coordinate Daily anti-Xa level and CBC  Monitor CBC and for s/sx of bleeding   Leonardo Makris I Oleda Borski 11/30/2022,1:40 PM

## 2022-11-30 NOTE — Progress Notes (Signed)
  Echocardiogram 2D Echocardiogram has been performed.  Milda Smart 11/30/2022, 3:26 PM

## 2022-11-30 NOTE — H&P (Signed)
History and Physical    Patient: Robert Burns MWN:[id number] DOB: [birth year] DOA: 11/30/2022 DOS: the patient was seen and examined on 11/30/2022 PCP: [name], MD (Inactive)  Patient coming from: Home  Chief Complaint:  Chief Complaint  Patient presents with   Chest Pain   HPI: Robert Burns is a 82 y.o. male with medical history significant of hypertension, hyperlipidemia, CAD s/p CABG with multiple PCI's, PAF, sick sinus syndrome s/p pacemaker, CLL, and BPH who presents with complaints of chest pain starting this morning while riding in the car.  Patient reports pain was severe and radiated to his neck and across his shoulders.  He had taken nitroglycerin with temporary relief 4 times prior to coming to the hospital.  He has never had to do that previously and reported last week not needing to use the nitroglycerin at all.  He makes note that this was the worst chest pain that he has had when compared to his other episodes in the past.  His daughter makes note that he had recently been dealing with a lot of pain in his hip, back, and knees for which it was difficult for him to get around.  His primary care provider had started him on prednisone pack until he can get in with orthopedics.  Furthermore his daughter makes note that he was scheduled to follow-up with gastroenterology in the near future and for workup for possible internal bleeding.  In the emergency department patient was noted to be afebrile with blood pressures elevated up to 178/76.  Labs noted WBC 33.8 ->32.6, hemoglobin 9.9->9.6, glucose 229, BUN 29, creatinine 1.18,  and high-sensitivity troponin 34->931.  Cardiology had been formally consulted.  Patient had been given full dose aspirin and started on a heparin drip per pharmacy.  Review of Systems: As mentioned in the history of present illness. All other systems reviewed and are negative. Past Medical History:  Diagnosis Date   Arthritis    "all over" (11/25/2015)    CAD (coronary artery disease)    a. s/p CABG  06/2002; b. 02/01/15 PCI: DES to prox SVG to PDA, staged PCI of SVG to Diag in 02/2015; c. 04/2017 Cath/PCI: LM nl, LAD 100ost, 56m, 75d, LCX 60ost, OM2 80, RCA 100ost, RPDA 80, LIMA->LAD ok, VG->D1 patent stent, VG->RPDA patent stent, 50p, VG->OM1->OM2 90p (3.0x24 Synergy DES), 100 between OM1->OM2 (med rx).   Cancer [facility])    Right Shoulder, Left Leg- BCC, SCC, AND MELANOMA   Epithelioid hemangioendothelioma    s/p resection of left SFA/mass with interposition 6 mm GoreTex graft 06/02/10, s/p repeat resection for positive margins 09/22/10   Hyperlipidemia    Hypertension    Leg pain    OSA on CPAP    PAD (peripheral artery disease) (HCC)    a. 10/2002 L SFA PTA/BMS; b. 8/17 LE Angio: LEIA 90 (9x40 self exp stent), LSFA short segment prox occlusion (staged PTA/stenting 01/03/2016), patent mid stent, RSFA 49m (staged PTA/DEB 02/14/2016).   Presence of permanent cardiac pacemaker    Medtronic   SSS (sick sinus syndrome) (HCC)    a. s/p PPM in 2007 with gen change 04/2015 - Medtronic Adapta ADDRL1, ser # [id number].   Type II diabetes mellitus (HCC)    Type II   Urgency of urination    Past Surgical History:  Procedure Laterality Date   CARDIAC CATHETERIZATION N/A 02/01/2015   Procedure: Left Heart Cath and Coronary Angiography;  Surgeon: Runell Gess, MD;  Location: [facility] INVASIVE CV LAB;  Service: Cardiovascular;  Laterality: N/A;   CARDIAC CATHETERIZATION N/A 02/01/2015   Procedure: Coronary Stent Intervention;  Surgeon: Runell Gess, MD;  Location: [facility] INVASIVE CV LAB;  Service: Cardiovascular;  Laterality: N/A;   CARDIAC CATHETERIZATION  06/2002   "just before bypass OR"   CARDIAC CATHETERIZATION N/A 03/01/2015   Procedure: Coronary Stent Intervention;  Surgeon: Runell Gess, MD;  Location: [facility] INVASIVE CV LAB;  Service: Cardiovascular;  Laterality: N/A;   CARDIAC CATHETERIZATION  02/06/2018   COLONOSCOPY     CORONARY ANGIOPLASTY      CORONARY ARTERY BYPASS GRAFT  06/2002   x5, LIMA-LAD;VG- Diag; seq VG- ramus & OM branch; VG-PDA   CORONARY STENT INTERVENTION N/A 04/12/2017   Procedure: CORONARY STENT INTERVENTION;  Surgeon: Runell Gess, MD;  Location: [facility] INVASIVE CV LAB;  Service: Cardiovascular;  Laterality: N/A;   CORONARY STENT INTERVENTION N/A 02/08/2018   Procedure: CORONARY STENT INTERVENTION;  Surgeon: Corky Crafts, MD;  Location: [facility] INVASIVE CV LAB;  Service: Cardiovascular;  Laterality: N/A;   CORONARY STENT INTERVENTION N/A 11/12/2018   Procedure: CORONARY STENT INTERVENTION;  Surgeon: Iran Ouch, MD;  Location: [facility] INVASIVE CV LAB;  Service: Cardiovascular;  Laterality: N/A;   CORONARY STENT INTERVENTION N/A 06/22/2020   Procedure: CORONARY STENT INTERVENTION;  Surgeon: Corky Crafts, MD;  [facility] LAB;  Service: Cardiovascular;  Laterality: N/A;   EP IMPLANTABLE DEVICE N/A 04/23/2015   Procedure: PPM Generator Changeout;  Surgeon: Thurmon Fair, MD;  Location: [facility] INVASIVE CV LAB;  Service: Cardiovascular;  Laterality: N/A;   FALSE ANEURYSM REPAIR Left 11/29/2018   Procedure: REPAIR FALSE ANEURYSM LEFT RADIAL ARTERY;  Surgeon: Larina Earthly, MD;  Location: [facility] OR;  Service: Vascular;  Laterality: Left;   FEMORAL ARTERY STENT Left ~ 2014   "taken out of my leg; couldn' catorgorize what kind so the put it under all 3"; cataroziepitheloid hemanioendotheliomau   FOOT FRACTURE SURGERY Left 1973   FRACTURE SURGERY     INSERT / REPLACE / REMOVE PACEMAKER  10/13/05   right side, medtronic Adapta   KNEE HARDWARE REMOVAL Right 1950's   "3-4 months after the insertion"   KNEE SURGERY Right 1950's   "broke my lower leg; had to put pin in my knee to keep lower leg in place til it healed"   LEFT HEART CATH AND CORS/GRAFTS ANGIOGRAPHY N/A 04/12/2017   Procedure: LEFT HEART CATH AND CORS/GRAFTS ANGIOGRAPHY;  Surgeon: Runell Gess, MD;  Location: [facility] INVASIVE CV LAB;  Service: Cardiovascular;   Laterality: N/A;   LEFT HEART CATH AND CORS/GRAFTS ANGIOGRAPHY N/A 02/06/2018   Procedure: LEFT HEART CATH AND CORS/GRAFTS ANGIOGRAPHY;  Surgeon: Lennette Bihari, MD;  Location: [facility] INVASIVE CV LAB;  Service: Cardiovascular;  Laterality: N/A;   LEFT HEART CATH AND CORS/GRAFTS ANGIOGRAPHY N/A 11/12/2018   Procedure: LEFT HEART CATH AND CORS/GRAFTS ANGIOGRAPHY;  Surgeon: Iran Ouch, MD;  Location: [facility] INVASIVE CV LAB;  Service: Cardiovascular;  Laterality: N/A;   LEFT HEART CATH AND CORS/GRAFTS ANGIOGRAPHY N/A 06/22/2020   Procedure: LEFT HEART CATH AND CORS/GRAFTS ANGIOGRAPHY;  Surgeon: Corky Crafts, MD;  Location: [facility] INVASIVE CV LAB;  Service: Cardiovascular;  Laterality: N/A;   PERIPHERAL VASCULAR CATHETERIZATION N/A 11/25/2015   Procedure: Lower Extremity Angiography;  Surgeon: Runell Gess, MD;  Location: [facility] INVASIVE CV LAB;  Service: Cardiovascular;  Laterality: N/A;   PERIPHERAL VASCULAR CATHETERIZATION Left 11/25/2015   Procedure: Peripheral Vascular Intervention;  Surgeon: Runell Gess, MD;  Location: [facility] INVASIVE CV LAB;  Service: Cardiovascular;  Laterality: Left;  external iliac   PERIPHERAL VASCULAR CATHETERIZATION N/A 01/03/2016   Procedure: Lower Extremity Angiography;  Surgeon: Runell Gess, MD;  Location: [facility] INVASIVE CV LAB;  Service: Cardiovascular;  Laterality: N/A;   PERIPHERAL VASCULAR CATHETERIZATION Left 01/03/2016   Procedure: Peripheral Vascular Intervention;  Surgeon: Runell Gess, MD;  Location: [facility] INVASIVE CV LAB;  Service: Cardiovascular;  Laterality: Left;  SFA   PERIPHERAL VASCULAR CATHETERIZATION Right 02/14/2016   Procedure: Peripheral Vascular Atherectomy;  Surgeon: Runell Gess, MD;  Location: [facility] INVASIVE CV LAB;  Service: Cardiovascular;  Laterality: Right;  SFA   POPLITEAL ARTERY STENT  01/03/2016   Contralateral access with a 7 [name] crossover sheath (second order catheter placement)   TONSILLECTOMY AND ADENOIDECTOMY     TUMOR EXCISION  Right ~ 2005   cancerous tumor removed from shoulder   TUMOR EXCISION Right ~ 2000   benign tumor removed from under shoulder   TUMOR EXCISION Left 06/02/2010   resection of Lt SFA wth interposition of Gore-Tex graft   Social History:  reports that he quit smoking about 51 years ago. His smoking use included pipe. He has never used smokeless tobacco. He reports that he does not drink alcohol and does not use drugs.  Allergies  Allergen Reactions   Peanut-Containing Drug Products Anaphylaxis and Other (See Comments)    Tongue swelling is severe    Ace Inhibitors Cough        Diltiazem Hcl Other (See Comments)    UNSPECIFIED REACTION     Meloxicam Other (See Comments)    GI upset    Doxycycline Other (See Comments) and Cough    Reaction of cough and runny nose   Eliquis [Apixaban] Other (See Comments)    dizziness   Sertraline Hcl Other (See Comments)   Carvedilol Itching   Clonidine Hcl Other (See Comments)    Patch only - skin irritation   Duloxetine Hcl Other (See Comments)    Urinary frequency    Family History  Problem Relation Age of Onset   Coronary artery disease Mother    Heart attack Mother    Hypertension Mother    Heart disease Mother        Open  Heart surgery   Diabetes Father    Heart disease Father    Hyperlipidemia Father    Heart attack Father    Hypertension Sister    Diabetes Sister    Heart disease Sister     Prior to Admission medications   Medication Sig Start Date End Date Taking? Authorizing Provider  amLODipine (NORVASC) 5 MG tablet Take 1 tablet (5 mg total) by mouth daily. 11/21/22 02/19/23 Yes Jodelle Gross, NP  atorvastatin (LIPITOR) 80 MG tablet Take 1 tablet (80 mg total) by mouth daily at 6 PM. 02/09/18  Yes Janetta Hora, PA-C  clopidogrel (PLAVIX) 75 MG tablet Take 1 tablet (75 mg total) by mouth daily with breakfast. 06/24/20  Yes Duke, Roe Rutherford, PA  dorzolamide-timolol (COSOPT) 22.3-6.8 MG/ML ophthalmic solution  Place 1 drop into both eyes daily. 04/30/20  Yes [provider]  glipiZIDE (GLUCOTROL) 5 MG tablet Take 5 mg by mouth daily with breakfast.  01/10/18  Yes [provider]  hydrALAZINE (APRESOLINE) 25 MG tablet Take 1 tablet (25 mg total) by mouth as needed (for BP >150 sys or 90 dia). 09/23/20  Yes Ronney Asters, NP  hydrochlorothiazide (HYDRODIURIL) 25 MG tablet Take  25 mg by mouth every morning.   Yes [provider]  isosorbide mononitrate (IMDUR) 60 MG 24 hr tablet Take 1.5 tablets (90 mg total) by mouth daily. 11/21/22 02/19/23 Yes Jodelle Gross, NP  melatonin 5 MG TABS Take 5 mg by mouth at bedtime.   Yes [provider]  metFORMIN (GLUCOPHAGE) 1000 MG tablet Take 1 tablet (1,000 mg total) by mouth 2 (two) times daily with a meal. Restart on 3/18. 06/23/20  Yes Duke, Roe Rutherford, PA  metoprolol tartrate (LOPRESSOR) 100 MG tablet Take 1 tablet (100 mg total) by mouth 2 (two) times daily. 08/07/17  Yes Jodelle Gross, NP  MULTIPLE VITAMIN-FOLIC ACID PO Take 1 tablet by mouth daily.   Yes [provider]  nitroGLYCERIN (NITROSTAT) 0.4 MG SL tablet Place 1 tablet (0.4 mg total) under the tongue every 5 (five) minutes as needed for chest pain. 11/21/22  Yes Jodelle Gross, NP  potassium chloride SA (K-DUR,KLOR-CON) 20 MEQ tablet Take 1 tablet (20 mEq total) by mouth daily. 02/18/16  Yes Regalado, Belkys A, MD  predniSONE (STERAPRED UNI-PAK 21 TAB) 5 MG (21) TBPK tablet Take 5 mg by mouth See admin instructions. follow package directions 11/27/22  Yes [provider]  ranolazine (RANEXA) 1000 MG SR tablet Take 1 tablet (1,000 mg total) by mouth 2 (two) times daily. 08/30/22  Yes Ronney Asters, NP  tamsulosin (FLOMAX) 0.4 MG CAPS capsule Take 0.4 mg by mouth 2 (two) times daily.  03/09/15  Yes [provider]  XARELTO 20 MG TABS tablet TAKE 1 TABLET EVERY DAY WITH SUPPER 09/11/22  Yes Croitoru, Mihai, MD    Physical  Exam: Vitals:   11/30/22 1130 11/30/22 1215 11/30/22 1314 11/30/22 1315  BP: 134/62 135/62  (!) 142/74  Pulse: 62 (!) 59  75  Resp: 12 18  20   Temp:   98.2 F (36.8 C)   TempSrc:   Oral   SpO2: 100% 100%  99%  Weight:      Height:       Constitutional: Elderly male who peers to be in no acute distress at this time. Eyes: PERRL, lids and conjunctivae normal ENMT: Mucous membranes are moist.  Fair dentition Neck: normal, supple Respiratory: clear to auscultation bilaterally, no wheezing, no crackles. Normal respiratory effort. No accessory muscle use.  Cardiovascular: Regular rate and rhythm, no murmurs / rubs / gallops. No extremity edema.  Abdomen: no tenderness, no masses palpated.  Bowel sounds positive.  Musculoskeletal: no clubbing / cyanosis. No joint deformity upper and lower extremities. Good ROM, no contractures. Normal muscle tone.  Skin: no rashes, lesions, ulcers. No induration Neurologic: CN 2-12 grossly intact.   Strength 5/5 in all 4.  Psychiatric: Normal judgment and insight. Alert and oriented x 3. Normal mood.   Data Reviewed:  EKG revealed a sinus rhythm at 79 bpm with ST depressions noted in the lateral leads.  Reviewed labs, imaging, and pertinent records as noted above in HPI.  Assessment and Plan:  NSTEMI CAD Patient presented with complaints of acute onset of chest pain this morning that radiated into his neck and across his shoulder blades.  Symptoms only temporarily relieved with nitroglycerin which he took a total of 4.  EKG noted ST depressions in the lateral leads.  Patient with history of three-vessel CABG back in 2004 and multiple subsequent PCIs. Patient had been loaded with aspirin and placed on a heparin drip.  Currently denies any chest pain. -Admit to a cardiac  telemetry bed -Heparin drip per pharmacy -Continue Plavix , statin, Ranexa, Imdur -Cardiology consulted, will follow-up for any further recommendations  Paroxysmal atrial fibrillation  on chronic anticoagulation Sick sinus syndrome s/p pacemaker Patient noted to be in sinus rhythm currently. -Hold Xarelto  Diabetes mellitus type 2 with hyperglycemia, without long-term use of insulin On admission glucose noted to be significantly elevated to 229.  Last hemoglobin A1c was 6.8 02/06/2022.  Home regimen includes metformin and glipizide.  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  WBC elevated at 33.8.  Patient is followed by hematology oncology in the outpatient setting and is on iron infusions.  He is scheduled to have a repeat iron infusion sometime next week.  Family notes he is scheduled to see [facility] gastroenterology for workup for concern for possibility of internal bleeding. -Check stool guaiac -Continue iron infusions in the outpatient setting -Continue up outpatient follow-up with GI and hematology oncology  Essential hypertension Blood pressures currently maintained. -Continue current home regimen  Hyperlipidemia LDL was 38 in 06/2021. -Continue atorvastatin  Arthritis pain Patient had been complaining of pain in his hips, back, and knees for which it was difficult for him to get around.  His primary care provider had recently started him on prednisone. -Continue prednisone  BPH -Continue Flomax  OSA  continue CPAP at night  DVT prophylaxis: Heparin Advance Care Planning:   Code Status: Prior    Consults: Cardiology  Family Communication: Daughter  Severity of Illness: The appropriate patient status for this patient is INPATIENT. Inpatient status is judged to be reasonable and necessary in order to provide the required intensity of service to ensure the patient's safety. The patient's  presenting symptoms, physical exam findings, and initial radiographic and laboratory data in the context of their chronic comorbidities is felt to place them at high risk for further clinical deterioration. Furthermore, it is not anticipated that the patient will be medically stable for discharge from the hospital within 2 midnights of admission.   * I certify that at the point of admission it is my clinical judgment that the patient will require inpatient hospital care spanning beyond 2 midnights from the point of admission due to high intensity of service, high risk for further deterioration and high frequency of surveillance required.*  Author: Clydie Braun, MD 11/30/2022 2:27 PM  For on call review [email or web address].

## 2022-11-30 NOTE — ED Triage Notes (Signed)
Patient presents from home with midsternal chest pain radiating into neck and bilateral shoulders since approximately 0700 today. Patient reports that he took a total of 4 sublingual nitros PTA with temporary relief of pain. Patient reports that he takes xarelto. Patient is AAOx4; on continuous cardiac and pulse oximetry monitoring. Patient reports that he was at the cancer center getting a blood draw this morning due to history of low iron, low hemoglobin, and leukemia. Patient denies any emesis or bloody stools but endorses that his stool has been darker than usual.

## 2022-11-30 NOTE — ED Notes (Signed)
Pt ambulatory to bathroom with cane.

## 2022-12-01 ENCOUNTER — Other Ambulatory Visit (HOSPITAL_COMMUNITY): Payer: Self-pay

## 2022-12-01 ENCOUNTER — Inpatient Hospital Stay (HOSPITAL_COMMUNITY)
Admission: EM | Disposition: A | Discharge status: Home / Self Care | Payer: Self-pay | Source: Home / Self Care | Attending: Family Medicine

## 2022-12-01 DIAGNOSIS — I2510 Atherosclerotic heart disease of native coronary artery without angina pectoris: Secondary | ICD-10-CM | POA: Diagnosis not present

## 2022-12-01 DIAGNOSIS — I214 Non-ST elevation (NSTEMI) myocardial infarction: Secondary | ICD-10-CM | POA: Diagnosis not present

## 2022-12-01 DIAGNOSIS — C9110 Chronic lymphocytic leukemia of B-cell type not having achieved remission: Secondary | ICD-10-CM | POA: Diagnosis not present

## 2022-12-01 HISTORY — PX: LEFT HEART CATH AND CORS/GRAFTS ANGIOGRAPHY: CATH118250

## 2022-12-01 LAB — GLUCOSE, CAPILLARY
Glucose-Capillary: 164 mg/dL — ABNORMAL HIGH (ref 70–99)
Glucose-Capillary: 158 mg/dL — ABNORMAL HIGH (ref 70–99)
Glucose-Capillary: 309 mg/dL — ABNORMAL HIGH (ref 70–99)
Glucose-Capillary: 297 mg/dL — ABNORMAL HIGH (ref 70–99)
Glucose-Capillary: 316 mg/dL — ABNORMAL HIGH (ref 70–99)

## 2022-12-01 LAB — APTT
aPTT: 52 seconds — ABNORMAL HIGH (ref 24–36)
aPTT: 66 seconds — ABNORMAL HIGH (ref 24–36)

## 2022-12-01 LAB — HEPARIN LEVEL (UNFRACTIONATED)
Heparin Unfractionated: >1.1 [IU]/mL — ABNORMAL HIGH (ref 0.30–0.70)
Heparin Unfractionated: >1.1 [IU]/mL — ABNORMAL HIGH (ref 0.30–0.70)

## 2022-12-01 LAB — LIPID PANEL
Cholesterol: 61 mg/dL (ref 0–200)
HDL: 29 mg/dL — ABNORMAL LOW (ref >40)
LDL Cholesterol: 23 mg/dL (ref 0–99)
Total CHOL/HDL Ratio: 2.1 RATIO
Triglycerides: 45 mg/dL (ref <150)
VLDL: 9 mg/dL (ref 0–40)

## 2022-12-01 LAB — CBC
HCT: 28.7 % — ABNORMAL LOW (ref 39.0–52.0)
Hemoglobin: 8.5 g/dL — ABNORMAL LOW (ref 13.0–17.0)
MCH: 22.8 pg — ABNORMAL LOW (ref 26.0–34.0)
MCHC: 29.6 g/dL — ABNORMAL LOW (ref 30.0–36.0)
MCV: 77.2 fL — ABNORMAL LOW (ref 80.0–100.0)
Platelets: 170 10*3/uL (ref 150–400)
RBC: 3.72 MIL/uL — ABNORMAL LOW (ref 4.22–5.81)
RDW: 18.6 % — ABNORMAL HIGH (ref 11.5–15.5)
WBC: 25.2 10*3/uL — ABNORMAL HIGH (ref 4.0–10.5)
nRBC: 0 % (ref 0.0–0.2)

## 2022-12-01 LAB — BASIC METABOLIC PANEL
Anion gap: 9 (ref 5–15)
BUN: 28 mg/dL — ABNORMAL HIGH (ref 8–23)
CO2: 22 mmol/L (ref 22–32)
Calcium: 9.2 mg/dL (ref 8.9–10.3)
Chloride: 105 mmol/L (ref 98–111)
Creatinine, Ser: 1.03 mg/dL (ref 0.61–1.24)
GFR, Estimated: >60 mL/min (ref >60)
Glucose, Bld: 194 mg/dL — ABNORMAL HIGH (ref 70–99)
Potassium: 4 mmol/L (ref 3.5–5.1)
Sodium: 136 mmol/L (ref 135–145)

## 2022-12-01 LAB — POCT ACTIVATED CLOTTING TIME: Activated Clotting Time: 128 seconds

## 2022-12-01 SURGERY — LEFT HEART CATH AND CORS/GRAFTS ANGIOGRAPHY
Anesthesia: LOCAL

## 2022-12-01 MED ORDER — IOHEXOL 350 MG/ML SOLN
INTRAVENOUS | Status: DC | PRN
  Administered 2022-12-01: 35 mL

## 2022-12-01 MED ORDER — FENTANYL CITRATE (PF) 100 MCG/2ML IJ SOLN
INTRAMUSCULAR | Status: DC | PRN
  Administered 2022-12-01: 25 ug via INTRAVENOUS

## 2022-12-01 MED ORDER — LIDOCAINE HCL (PF) 1 % IJ SOLN
INTRAMUSCULAR | Status: AC
  Filled 2022-12-01: qty 30

## 2022-12-01 MED ORDER — LIDOCAINE HCL (PF) 1 % IJ SOLN
INTRAMUSCULAR | Status: DC | PRN
  Administered 2022-12-01: 15 mL

## 2022-12-01 MED ORDER — VERAPAMIL HCL 2.5 MG/ML IV SOLN
INTRAVENOUS | Status: AC
  Filled 2022-12-01: qty 2

## 2022-12-01 MED ORDER — SODIUM CHLORIDE 0.9% FLUSH: 3.0000 mL | INTRAVENOUS | Status: DC | PRN

## 2022-12-01 MED ORDER — SODIUM CHLORIDE 0.9 % IV SOLN: 250.0000 mL | INTRAVENOUS | Status: DC | PRN

## 2022-12-01 MED ORDER — MIDAZOLAM HCL 2 MG/2ML IJ SOLN
INTRAMUSCULAR | Status: AC
  Filled 2022-12-01: qty 2

## 2022-12-01 MED ORDER — SODIUM CHLORIDE 0.9 % IV SOLN: INTRAVENOUS | Status: AC

## 2022-12-01 MED ORDER — SODIUM CHLORIDE 0.9% FLUSH
3.0000 mL | Freq: Two times a day (BID) | INTRAVENOUS | Status: DC
  Administered 2022-12-01 – 2022-12-02 (×2): 3 mL via INTRAVENOUS

## 2022-12-01 MED ORDER — HYDRALAZINE HCL 20 MG/ML IJ SOLN: 10.0000 mg | INTRAMUSCULAR | Status: AC | PRN

## 2022-12-01 MED ORDER — SODIUM CHLORIDE 0.9 % WEIGHT BASED INFUSION: 3.0000 mL/kg/h | INTRAVENOUS | Status: DC

## 2022-12-01 MED ORDER — ASPIRIN 81 MG PO CHEW
81.0000 mg | CHEWABLE_TABLET | ORAL | Status: AC
  Administered 2022-12-01: 81 mg via ORAL
  Filled 2022-12-01: qty 1

## 2022-12-01 MED ORDER — SODIUM CHLORIDE 0.9 % WEIGHT BASED INFUSION
3.0000 mL/kg/h | INTRAVENOUS | Status: DC
  Administered 2022-12-01: 3 mL/kg/h via INTRAVENOUS

## 2022-12-01 MED ORDER — FENTANYL CITRATE (PF) 100 MCG/2ML IJ SOLN
INTRAMUSCULAR | Status: AC
  Filled 2022-12-01: qty 2

## 2022-12-01 MED ORDER — SODIUM CHLORIDE 0.9 % WEIGHT BASED INFUSION: 1.0000 mL/kg/h | INTRAVENOUS | Status: DC

## 2022-12-01 MED ORDER — HEPARIN SODIUM (PORCINE) 5000 UNIT/ML IJ SOLN
5000.0000 [IU] | Freq: Three times a day (TID) | INTRAMUSCULAR | Status: DC
  Administered 2022-12-01 – 2022-12-02 (×2): 5000 [IU] via SUBCUTANEOUS
  Filled 2022-12-01 (×2): qty 1

## 2022-12-01 MED ORDER — ALBUTEROL SULFATE (2.5 MG/3ML) 0.083% IN NEBU
2.5000 mg | INHALATION_SOLUTION | Freq: Two times a day (BID) | RESPIRATORY_TRACT | Status: DC
  Administered 2022-12-01: 2.5 mg via RESPIRATORY_TRACT
  Filled 2022-12-01 (×2): qty 3

## 2022-12-01 MED ORDER — MIDAZOLAM HCL 2 MG/2ML IJ SOLN
INTRAMUSCULAR | Status: DC | PRN
  Administered 2022-12-01: 1 mg via INTRAVENOUS

## 2022-12-01 MED ORDER — SODIUM CHLORIDE 0.9 % WEIGHT BASED INFUSION
1.0000 mL/kg/h | INTRAVENOUS | Status: DC
  Administered 2022-12-01: 1 mL/kg/h via INTRAVENOUS

## 2022-12-01 MED ORDER — HEPARIN (PORCINE) IN NACL 1000-0.9 UT/500ML-% IV SOLN
INTRAVENOUS | Status: DC | PRN
  Administered 2022-12-01 (×2): 500 mL

## 2022-12-01 SURGICAL SUPPLY — 12 items
CATH INFINITI 5 FR MPA2 (CATHETERS) IMPLANT
CATH INFINITI 5FR AL1 (CATHETERS) IMPLANT
CATH INFINITI 5FR MULTPACK ANG (CATHETERS) IMPLANT
GLIDESHEATH SLEND SS 6F .021 (SHEATH) IMPLANT
GUIDEWIRE INQWIRE 1.5J.035X260 (WIRE) IMPLANT
INQWIRE 1.5J .035X260CM (WIRE) ×1
KIT MICROPUNCTURE NIT STIFF (SHEATH) IMPLANT
PACK CARDIAC CATHETERIZATION (CUSTOM PROCEDURE TRAY) ×1 IMPLANT
SET ATX-X65L (MISCELLANEOUS) IMPLANT
SHEATH PINNACLE 5F 10CM (SHEATH) IMPLANT
SHEATH PROBE COVER 6X72 (BAG) IMPLANT
WIRE HI TORQ VERSACORE-J 145CM (WIRE) IMPLANT

## 2022-12-01 NOTE — Progress Notes (Signed)
   Patient Name: Robert Burns Date of Encounter: 12/01/2022 [facility] Cardiologist: Nanetta Batty, MD   Interval Summary  .    Patient with complex CAD history s/p CABG in 2004 admitted with symptoms concerning for accelerating angina and was found to have troponin elevation to 931, ruling in for NSTEMI. He is now on IV heparin pending LHC today.  Patient reports no overnight chest pain or dyspnea. He is generally feeling well this morning but expresses some distress with his multiple chronic illnesses included CLL.   Vital Signs .    Vitals:   11/30/22 2217 11/30/22 2327 12/01/22 0226 12/01/22 0455  BP: (!) 143/63 126/78 (!) 156/73   Pulse: 62 82 69   Resp: 16 16 16    Temp:  (!) 97.3 F (36.3 C) 97.8 F (36.6 C)   TempSrc:  Axillary Oral   SpO2: 98% 99% 97%   Weight:    79 kg  Height:        Intake/Output Summary (Last 24 hours) at 12/01/2022 0735 Last data filed at 12/01/2022 0228 Gross per 24 hour  Intake 40.02 ml  Output 101 ml  Net -60.98 ml      12/01/2022    4:55 AM 11/30/2022    9:15 AM 11/28/2022   10:05 AM  Last 3 Weights  Weight (lbs) 174 lb 2.6 oz 163 lb 170 lb  Weight (kg) 79 kg 73.936 kg 77.111 kg      Telemetry/ECG    Atrial paced - Personally Reviewed  Physical Exam .   GEN: No acute distress.   Neck: No JVD Cardiac: RRR, no murmurs, rubs, or gallops.  Respiratory: Clear to auscultation bilaterally. GI: Soft, nontender, non-distended  MS: No edema  Assessment & Plan .     NSTEMI Complex CAD  Patient status post CABG in 2004, (LIMA to LAD, SVG to diagonal 1 SVG to RPDA, SVG to OM1 and OM 2, PCI with drug-eluting stent to the proximal SVG and PDA in 2016, staged PCI of the SVG to diagonal and November 2016. Increased anginal symptoms over the last year, now consistent with accelerating angina. Troponin 34->931. ECG with mild ST depression in lateral leads.   No chest pain this morning LHC today, continue heparin to  procedure. Continue ASA 81mg , Plavix 75mg , Imdur 90mg , Metoprolol Tartrate 100mg  BID, Ranexa 1000mg  BID.  HFmrEF  TTE this admission shows LVEF 45-50% with global hypokinesis and grade II DD. Normal right atrial pressure with collapsible IVC. This LVEF is similar to 2022 stress myoview.   Patient euvolemic on physical exam. Given reduced LVEF, would consider ARB or Entresto follow LHC today. Patient also a good candidate for SGLT2 with DM type II. Could also consider replacing hydrochlorothiazide with spironolactone.   Paroxysmal atrial fibrillation SSS s/p PPM  Patient with atrial paced rhythm this admission, no recurrent afib. Xarelto currently on hold with pending LHC.  PAD  Stable symptoms. Continue to monitor.  Hyperlipidemia  Last LDL 38 from March 2023. Repeat this admission. Continue Atorvastatin 80mg .  Hypertension  BP moderately elevated this admission. For now, continue amlodipine 5 mg, hydralazine 25 mg (PRN PTA), HCTZ 25 mg, Lopressor 100 mg BID.  Per primary team: CLL Iron deficiency anemia DM type II  For questions or updates, please contact [facility] Please consult [email or web address] for contact info under        Signed, Perlie Gold, PA-C

## 2022-12-01 NOTE — H&P (View-Only) (Signed)
   Patient Name: Robert Burns Date of Encounter: 12/01/2022 [facility] Cardiologist: Nanetta Batty, MD   Interval Summary  .    Patient with complex CAD history s/p CABG in 2004 admitted with symptoms concerning for accelerating angina and was found to have troponin elevation to 931, ruling in for NSTEMI. He is now on IV heparin pending LHC today.  Patient reports no overnight chest pain or dyspnea. He is generally feeling well this morning but expresses some distress with his multiple chronic illnesses included CLL.   Vital Signs .    Vitals:   11/30/22 2217 11/30/22 2327 12/01/22 0226 12/01/22 0455  BP: (!) 143/63 126/78 (!) 156/73   Pulse: 62 82 69   Resp: 16 16 16    Temp:  (!) 97.3 F (36.3 C) 97.8 F (36.6 C)   TempSrc:  Axillary Oral   SpO2: 98% 99% 97%   Weight:    79 kg  Height:        Intake/Output Summary (Last 24 hours) at 12/01/2022 0735 Last data filed at 12/01/2022 0228 Gross per 24 hour  Intake 40.02 ml  Output 101 ml  Net -60.98 ml      12/01/2022    4:55 AM 11/30/2022    9:15 AM 11/28/2022   10:05 AM  Last 3 Weights  Weight (lbs) 174 lb 2.6 oz 163 lb 170 lb  Weight (kg) 79 kg 73.936 kg 77.111 kg      Telemetry/ECG    Atrial paced - Personally Reviewed  Physical Exam .   GEN: No acute distress.   Neck: No JVD Cardiac: RRR, no murmurs, rubs, or gallops.  Respiratory: Clear to auscultation bilaterally. GI: Soft, nontender, non-distended  MS: No edema  Assessment & Plan .     NSTEMI Complex CAD  Patient status post CABG in 2004, (LIMA to LAD, SVG to diagonal 1 SVG to RPDA, SVG to OM1 and OM 2, PCI with drug-eluting stent to the proximal SVG and PDA in 2016, staged PCI of the SVG to diagonal and November 2016. Increased anginal symptoms over the last year, now consistent with accelerating angina. Troponin 34->931. ECG with mild ST depression in lateral leads.   No chest pain this morning LHC today, continue heparin to  procedure. Continue ASA 81mg , Plavix 75mg , Imdur 90mg , Metoprolol Tartrate 100mg  BID, Ranexa 1000mg  BID.  HFmrEF  TTE this admission shows LVEF 45-50% with global hypokinesis and grade II DD. Normal right atrial pressure with collapsible IVC. This LVEF is similar to 2022 stress myoview.   Patient euvolemic on physical exam. Given reduced LVEF, would consider ARB or Entresto follow LHC today. Patient also a good candidate for SGLT2 with DM type II. Could also consider replacing hydrochlorothiazide with spironolactone.   Paroxysmal atrial fibrillation SSS s/p PPM  Patient with atrial paced rhythm this admission, no recurrent afib. Xarelto currently on hold with pending LHC.  PAD  Stable symptoms. Continue to monitor.  Hyperlipidemia  Last LDL 38 from March 2023. Repeat this admission. Continue Atorvastatin 80mg .  Hypertension  BP moderately elevated this admission. For now, continue amlodipine 5 mg, hydralazine 25 mg (PRN PTA), HCTZ 25 mg, Lopressor 100 mg BID.  Per primary team: CLL Iron deficiency anemia DM type II  For questions or updates, please contact [facility] Please consult [email or web address] for contact info under        Signed, Perlie Gold, PA-C

## 2022-12-01 NOTE — Progress Notes (Signed)
PROGRESS NOTE    Robert Burns  FAO:[id number] DOB: 1941-01-10 DOA: 11/30/2022 PCP: Robert Funk, MD (Inactive)   Brief Narrative:  HPI: Robert Burns is a 82 y.o. male with medical history significant of hypertension, hyperlipidemia, CAD s/p CABG with multiple PCI's, PAF, sick sinus syndrome s/p pacemaker, CLL, and BPH who presents with complaints of chest pain starting this morning while riding in the car.  Patient reports pain was severe and radiated to his neck and across his shoulders.  He had taken nitroglycerin with temporary relief 4 times prior to coming to the hospital.  He has never had to do that previously and reported last week not needing to use the nitroglycerin at all.  He makes note that this was the worst chest pain that he has had when compared to his other episodes in the past.  His daughter makes note that he had recently been dealing with a lot of pain in his hip, back, and knees for which it was difficult for him to get around.  His primary care provider had started him on prednisone pack until he can get in with orthopedics.  Furthermore his daughter makes note that he was scheduled to follow-up with gastroenterology in the near future and for workup for possible internal bleeding.   In the emergency department patient was noted to be afebrile with blood pressures elevated up to 178/76.  Labs noted WBC 33.8 ->32.6, hemoglobin 9.9->9.6, glucose 229, BUN 29, creatinine 1.18,  and high-sensitivity troponin 34->931.  Cardiology had been formally consulted.  Patient had been given full dose aspirin and started on a heparin drip per pharmacy.    Assessment & Plan:   Active Problems:   CAD, multiple vessel   NSTEMI (non-ST elevated myocardial infarction) (HCC)   SSS (sick sinus syndrome), medtronic adapta   Paroxysmal atrial fibrillation (HCC)   Type 2 diabetes mellitus with complication, without long-term current use of insulin (HCC)   CLL (chronic lymphocytic leukemia)  (HCC)   Iron deficiency anemia due to chronic blood loss   Essential hypertension   Dyslipidemia (high LDL; low HDL)   Arthritis pain   BPH (benign prostatic hyperplasia)   OSA on CPAP  History of CAD status post CABG in the past, now here with NSTEMI: EKG noted ST depressions in the lateral leads.  Patient with history of three-vessel CABG back in 2004 and multiple subsequent PCIs.  Troponin 34> 931.  Patient had been loaded with aspirin and placed on a heparin drip.  Cardiology on board.  Plan for cardiac cath today.   Paroxysmal atrial fibrillation on chronic anticoagulation Sick sinus syndrome s/p pacemaker Patient noted to be in sinus rhythm currently. -Hold Xarelto, patient on heparin.   Diabetes mellitus type 2 with hyperglycemia, without long-term use of insulin On admission glucose noted to be significantly elevated to 229.  Hemoglobin A1c 6.6.  Home regimen includes metformin and glipizide which are on hold.  Currently on Semglee 5 units and SSI and blood sugar controlled.   CLL / Iron deficiency anemia due to chronic blood loss: On admission hemoglobin noted to be 9.6 which appears around patient's baseline with low MCV and MCH.  WBC elevated at 33.8.  Patient is followed by hematology oncology in the outpatient setting and is on iron infusions.  He is scheduled to have a repeat iron infusion sometime next week.  Family notes he is scheduled to see [facility] gastroenterology for workup for concern for possibility of internal bleeding.  Follow-up  with hematology and GI as outpatient.   Essential hypertension Blood pressures currently maintained.  On amlodipine 5, hydrochlorothiazide 25, Imdur 90, metoprolol 100 twice daily.   Hyperlipidemia LDL was 38 in 06/2021. -Continue atorvastatin   Arthritis pain Patient had been complaining of pain in his hips, back, and knees for which it was difficult for him to get around.  His primary care provider had recently started him on  prednisone. -Continue prednisone   BPH -Continue Flomax   OSA  continue CPAP at night  DVT prophylaxis: Heparin   Code Status: Full Code  Family Communication:  None present at bedside.  Plan of care discussed with patient in length and he/she verbalized understanding and agreed with it.  Status is: Inpatient Remains inpatient appropriate because: Cardiac cath today.   Estimated body mass index is 27.28 kg/m as calculated from the following:   Height as of this encounter: 5\' 7"  (1.702 m).   Weight as of this encounter: 79 kg.    Nutritional Assessment: Body mass index is 27.28 kg/m.[name] Seen by dietician.  I agree with the assessment and plan as outlined below: Nutrition Status:        . Skin Assessment: I have examined the patient's skin and I agree with the wound assessment as performed by the wound care RN as outlined below:    Consultants:  Cardiology  Procedures:  As above  Antimicrobials:  Anti-infectives (From admission, onward)    None         Subjective: Patient seen and examined.  No complaints, no chest pain or shortness of breath.  Objective: Vitals:   11/30/22 2217 11/30/22 2327 12/01/22 0226 12/01/22 0455  BP: (!) 143/63 126/78 (!) 156/73   Pulse: 62 82 69   Resp: 16 16 16    Temp:  (!) 97.3 F (36.3 C) 97.8 F (36.6 C)   TempSrc:  Axillary Oral   SpO2: 98% 99% 97%   Weight:    79 kg  Height:        Intake/Output Summary (Last 24 hours) at 12/01/2022 0732 Last data filed at 12/01/2022 0228 Gross per 24 hour  Intake 40.02 ml  Output 101 ml  Net -60.98 ml   Filed Weights   11/30/22 0915 12/01/22 0455  Weight: 73.9 kg 79 kg    Examination:  General exam: Appears calm and comfortable  Respiratory system: Clear to auscultation. Respiratory effort normal. Cardiovascular system: S1 & S2 heard, RRR. No JVD, murmurs, rubs, gallops or clicks. No pedal edema. Gastrointestinal system: Abdomen is nondistended, soft and nontender. No  organomegaly or masses felt. Normal bowel sounds heard. Central nervous system: Alert and oriented. No focal neurological deficits. Extremities: Symmetric 5 x 5 power. Skin: No rashes, lesions or ulcers Psychiatry: Judgement and insight appear normal. Mood & affect appropriate.    Data Reviewed: I have personally reviewed following labs and imaging studies  CBC: Recent Labs  Lab 11/30/22 0803 11/30/22 0916 12/01/22 0329  WBC 33.8* 32.6* 25.2*  NEUTROABS 11.2*  --   --   HGB 9.9* 9.6* 8.5*  HCT 34.7* 33.5* 28.7*  MCV 79.2* 77.7* 77.2*  PLT 212 175 170   Basic Metabolic Panel: Recent Labs  Lab 11/30/22 0803 11/30/22 0916 12/01/22 0329  NA 141 137 136  K 4.7 4.6 4.0  CL 105 104 105  CO2 23 20* 22  GLUCOSE 208* 229* 194*  BUN 32* 29* 28*  CREATININE 1.07 1.18 1.03  CALCIUM 9.7 9.5 9.2   GFR: Estimated  Creatinine Clearance: 50.8 mL/min (by C-G formula based on SCr of 1.03 mg/dL). Liver Function Tests: Recent Labs  Lab 11/30/22 0803  AST 20  ALT 16  ALKPHOS 58  BILITOT 0.8  PROT 7.4  ALBUMIN 4.8   No results for input(s): "LIPASE", "AMYLASE" in the last 168 hours. No results for input(s): "AMMONIA" in the last 168 hours. Coagulation Profile: No results for input(s): "INR", "PROTIME" in the last 168 hours. Cardiac Enzymes: No results for input(s): "CKTOTAL", "CKMB", "CKMBINDEX", "TROPONINI" in the last 168 hours. BNP (last 3 results) No results for input(s): "PROBNP" in the last 8760 hours. HbA1C: Recent Labs    11/30/22 0916  HGBA1C 6.6*   CBG: Recent Labs  Lab 11/30/22 1559 11/30/22 1747 11/30/22 2205  GLUCAP 157* 191* 220*   Lipid Profile: No results for input(s): "CHOL", "HDL", "LDLCALC", "TRIG", "CHOLHDL", "LDLDIRECT" in the last 72 hours. Thyroid Function Tests: No results for input(s): "TSH", "T4TOTAL", "FREET4", "T3FREE", "THYROIDAB" in the last 72 hours. Anemia Panel: Recent Labs    11/30/22 0803 11/30/22 0804  FERRITIN  --  118   TIBC 512*  --   IRON 56  --    Sepsis Labs: No results for input(s): "PROCALCITON", "LATICACIDVEN" in the last 168 hours.  Recent Results (from the past 240 hour(s))  MRSA Next Gen by PCR, Nasal     Status: None   Collection Time: 11/30/22  7:54 PM   Specimen: Nasal Mucosa; Nasal Swab  Result Value Ref Range Status   MRSA by PCR Next Gen NOT DETECTED NOT DETECTED Final    Comment: (NOTE) The GeneXpert MRSA Assay (FDA approved for NASAL specimens only), is one component of a comprehensive MRSA colonization surveillance program. It is not intended to diagnose MRSA infection nor to guide or monitor treatment for MRSA infections. Test performance is not FDA approved in patients less than 85 years old. Performed at [facility], [address], [place], [place] [address]      Radiology Studies: ECHOCARDIOGRAM COMPLETE  Result Date: 11/30/2022    ECHOCARDIOGRAM REPORT   Patient Name:   ALISHA DURR Gritton Date of Exam: 11/30/2022 Medical Rec #:  [id number]       Height:       67.0 in Accession #:    [id number]      Weight:       163.0 lb Date of Birth:  22-Jan-1941       BSA:          1.854 m Patient Age:    58 years        BP:           139/66 mmHg Patient Gender: M               HR:           85 bpm. Exam Location:  Inpatient Procedure: 2D Echo, Cardiac Doppler, Color Doppler and Intracardiac            Opacification Agent Indications:    NSTEMI  History:        Patient has prior history of Echocardiogram examinations, most                 recent 06/22/2020. CAD, Pacemaker, PAD, Arrythmias:SSS; Risk                 Factors:Diabetes, Hypertension, Dyslipidemia and Sleep Apnea.                 Cancer.  Sonographer:  Milda Smart Referring Phys: [id number] SHENG L HALEY  Sonographer Comments: Image acquisition challenging due to patient body habitus. IMPRESSIONS  1. Left ventricular ejection fraction, by estimation, is 45 to 50%. The left ventricle has mildly decreased function. The left  ventricle demonstrates global hypokinesis. The left ventricular internal cavity size was mildly to moderately dilated. Left ventricular diastolic parameters are consistent with Grade II diastolic dysfunction (pseudonormalization).  2. Right ventricular systolic function is normal. The right ventricular size is normal. There is normal pulmonary artery systolic pressure.  3. Left atrial size was moderately dilated.  4. Right atrial size was moderately dilated.  5. The mitral valve is normal in structure. Trivial mitral valve regurgitation. No evidence of mitral stenosis.  6. The aortic valve is tricuspid. There is mild calcification of the aortic valve. There is mild thickening of the aortic valve. Aortic valve regurgitation is mild.  7. The inferior vena cava is normal in size with greater than 50% respiratory variability, suggesting right atrial pressure of 3 mmHg. Comparison(s): Changes from prior study are noted. Mild to moderately dilated LV with mildly reduced function but no apparent focal wall motion abnormalities. FINDINGS  Left Ventricle: Left ventricular ejection fraction, by estimation, is 45 to 50%. The left ventricle has mildly decreased function. The left ventricle demonstrates global hypokinesis. Definity contrast agent was given IV to delineate the left ventricular  endocardial borders. The left ventricular internal cavity size was mildly to moderately dilated. There is no left ventricular hypertrophy. Left ventricular diastolic parameters are consistent with Grade II diastolic dysfunction (pseudonormalization). Right Ventricle: The right ventricular size is normal. Right vetricular wall thickness was not well visualized. Right ventricular systolic function is normal. There is normal pulmonary artery systolic pressure. The tricuspid regurgitant velocity is 2.78 m/s, and with an assumed right atrial pressure of 3 mmHg, the estimated right ventricular systolic pressure is 33.9 mmHg. Left Atrium: Left  atrial size was moderately dilated. Right Atrium: Right atrial size was moderately dilated. Pericardium: There is no evidence of pericardial effusion. Mitral Valve: The mitral valve is normal in structure. Trivial mitral valve regurgitation. No evidence of mitral valve stenosis. Tricuspid Valve: The tricuspid valve is normal in structure. Tricuspid valve regurgitation is trivial. No evidence of tricuspid stenosis. Aortic Valve: The aortic valve is tricuspid. There is mild calcification of the aortic valve. There is mild thickening of the aortic valve. Aortic valve regurgitation is mild. Aortic regurgitation PHT measures 605 msec. Pulmonic Valve: The pulmonic valve was grossly normal. Pulmonic valve regurgitation is trivial. No evidence of pulmonic stenosis. Aorta: The aortic root and ascending aorta are structurally normal, with no evidence of dilitation. Venous: The inferior vena cava is normal in size with greater than 50% respiratory variability, suggesting right atrial pressure of 3 mmHg. IAS/Shunts: The atrial septum is grossly normal. Additional Comments: A device lead is visualized.  LEFT VENTRICLE PLAX 2D LVIDd:         5.40 cm   Diastology LVIDs:         4.40 cm   LV e' medial:    4.68 cm/s LV PW:         1.00 cm   LV E/e' medial:  20.0 LV IVS:        1.10 cm   LV e' lateral:   7.51 cm/s LVOT diam:     2.30 cm   LV E/e' lateral: 12.5 LV SV:         90 LV SV Index:   49 LVOT  Area:     4.15 cm  RIGHT VENTRICLE             IVC RV S prime:     11.10 cm/s  IVC diam: 2.00 cm TAPSE (M-mode): 1.9 cm LEFT ATRIUM             Index        RIGHT ATRIUM           Index LA diam:        5.30 cm 2.86 cm/m   RA Area:     23.20 cm LA Vol (A2C):   71.6 ml 38.62 ml/m  RA Volume:   69.10 ml  37.27 ml/m LA Vol (A4C):   74.9 ml 40.40 ml/m LA Biplane Vol: 76.7 ml 41.37 ml/m  AORTIC VALVE LVOT Vmax:   85.10 cm/s LVOT Vmean:  62.400 cm/s LVOT VTI:    0.217 m AI PHT:      605 msec  AORTA Ao Root diam: 3.40 cm Ao Asc diam:   3.70 cm MITRAL VALVE                TRICUSPID VALVE MV Area (PHT): 3.51 cm     TR Peak grad:   30.9 mmHg MV Decel Time: 216 msec     TR Mean grad:   23.0 mmHg MR Peak grad: 29.8 mmHg     TR Vmax:        278.00 cm/s MR Vmax:      273.00 cm/s   TR Vmean:       231.0 cm/s MV E velocity: 93.60 cm/s MV A velocity: 105.00 cm/s  SHUNTS MV E/A ratio:  0.89         Systemic VTI:  0.22 m                             Systemic Diam: 2.30 cm Jodelle Red MD Electronically signed by Jodelle Red MD Signature Date/Time: 11/30/2022/4:57:31 PM    Final    DG Chest Portable 1 View  Result Date: 11/30/2022 CLINICAL DATA:  Chest pain EXAM: PORTABLE CHEST 1 VIEW COMPARISON:  X-ray 02/05/2022 FINDINGS: Sternal wires. Right upper chest battery pack with pacemaker leads along the right side of the heart are similar to previous. Minimal left basilar atelectasis. No consolidation, pneumothorax, effusion or edema. Calcified aorta. Overlapping cardiac leads. Degenerative changes of the spine and shoulders. IMPRESSION: Postop chest.  Pacemaker.  Minimal left basilar atelectasis or scar Electronically Signed   By: Karen Kays M.D.   On: 11/30/2022 10:12    Scheduled Meds:  albuterol  2.5 mg Nebulization Q6H   amLODipine  5 mg Oral Daily   atorvastatin  80 mg Oral q1800   clopidogrel  75 mg Oral Q breakfast   hydrochlorothiazide  25 mg Oral BH-q7a   insulin aspart  0-15 Units Subcutaneous TID WC   insulin aspart  0-5 Units Subcutaneous QHS   insulin glargine-yfgn  5 Units Subcutaneous Daily   isosorbide mononitrate  90 mg Oral Daily   metoprolol tartrate  100 mg Oral BID   predniSONE  20 mg Oral Q breakfast   Followed by   Melene Muller ON 12/02/2022] predniSONE  15 mg Oral Q breakfast   Followed by   Melene Muller ON 12/03/2022] predniSONE  10 mg Oral Q breakfast   Followed by   Melene Muller ON 12/04/2022] predniSONE  5 mg Oral Q breakfast   ranolazine  1,000 mg Oral  BID   tamsulosin  0.4 mg Oral BID   Continuous  Infusions:  sodium chloride 3 mL/kg/hr (12/01/22 0425)   Followed by   sodium chloride 1 mL/kg/hr (12/01/22 0533)   heparin 1,100 Units/hr (12/01/22 0102)     LOS: 1 day   Hughie Closs, MD [facility]  12/01/2022, 7:32 AM   *Please note that this is a verbal dictation therefore any spelling or grammatical errors are due to the "Dragon Medical One" system interpretation.  Please page via Amion and do not message via secure chat for urgent patient care matters. Secure chat can be used for non urgent patient care matters.  How to contact the [facility] Attending or Consulting provider 7A - 7P or covering provider during after hours 7P -7A, for this patient?  Check the care team in [facility] and look for a) attending/consulting [facility] provider listed and b) the [facility] team listed. Page or secure chat 7A-7P. Log into [email or web address] and use 's universal password to access. If you do not have the password, please contact the hospital operator. Locate the [facility] provider you are looking for under [facility] and page to a number that you can be directly reached. If you still have difficulty reaching the provider, please page the [facility] (Director on Call) for the Hospitalists listed on amion for assistance.

## 2022-12-01 NOTE — Progress Notes (Signed)
   12/01/22 2200  BiPAP/CPAP/SIPAP  $ Non-Invasive Home Ventilator  Subsequent  BiPAP/CPAP/SIPAP Pt Type Adult  BiPAP/CPAP/SIPAP Resmed  Mask Type Full face mask  Mask Size Medium  PEEP 13 cmH20  FiO2 (%) 21 %  Patient Home Equipment Yes  Nasal massage performed Yes  CPAP/SIPAP surface wiped down Yes  Safety Check Completed by RT for Home Unit Yes, no issues noted  BiPAP/CPAP /SiPAP Vitals  Pulse Rate 70  Resp 18  SpO2 96 %  Bilateral Breath Sounds Clear;Diminished  MEWS Score/Color  MEWS Score 0  MEWS Score Color Robert Burns

## 2022-12-01 NOTE — Interval H&P Note (Signed)
History and Physical Interval Note:  12/01/2022 10:50 AM  Robert Burns  has presented today for surgery, with the diagnosis of nstemi.  The various methods of treatment have been discussed with the patient and family. After consideration of risks, benefits and other options for treatment, the patient has consented to  Procedure(s): LEFT HEART CATH AND CORS/GRAFTS ANGIOGRAPHY (N/A) as a surgical intervention.  The patient's history has been reviewed, patient examined, no change in status, stable for surgery.  I have reviewed the patient's chart and labs.  Questions were answered to the patient's satisfaction.   Cath Lab Visit (complete for each Cath Lab visit)  Clinical Evaluation Leading to the Procedure:   ACS: Yes.    Non-ACS:    Anginal Classification: CCS IV  Anti-ischemic medical therapy: Maximal Therapy (2 or more classes of medications)  Non-Invasive Test Results: No non-invasive testing performed  Prior CABG: Previous CABG        Theron Arista [facility] 12/01/2022 10:50 AM

## 2022-12-01 NOTE — Progress Notes (Signed)
SITE AREA: right groin/femoral  SITE PRIOR TO REMOVAL:  LEVEL 0  PRESSURE APPLIED FOR: approximately 25 minutes  MANUAL: yes  PATIENT STATUS DURING PULL: stable  POST PULL SITE:  LEVEL 0  POST PULL INSTRUCTIONS GIVEN: yes, drsg x 24 hours, may shower in 24 hours, but no sitting in water x 1 week, no hot tubs, bath tubs, or pools, take it easy on steps and climbing into vehicles, no running, jumping, keep right groin area dry  POST PULL PULSES PRESENT:  bilateral pedal pulses at +2  DRESSING APPLIED: gauze with tegaderm  BEDREST BEGINS @ 1305  COMMENTS:

## 2022-12-01 NOTE — Plan of Care (Signed)
  Problem: Education: Goal: Understanding of CV disease, CV risk reduction, and recovery process will improve Outcome: Progressing   Problem: Skin Integrity: Goal: Risk for impaired skin integrity will decrease Outcome: Progressing   Problem: Clinical Measurements: Goal: Will remain free from infection Outcome: Progressing   Problem: Safety: Goal: Ability to remain free from injury will improve Outcome: Progressing

## 2022-12-01 NOTE — TOC Benefit Eligibility Note (Signed)
Pharmacy Patient Advocate Encounter  Insurance verification completed.    The patient is insured through [facility] gold plus. Patient has Medicare and is not eligible for a copay card, but may be able to apply for patient assistance, if available.    Ran test claim for Entresto and the current 30 day co-pay is $161.76.  Ran test claim for Jardiance and the current 30 day co-pay is $143.69.  Ran test claim for Marcelline Deist and the current 30 day co-pay is $136.90. Patient is in coverage GAP  This test claim was processed through [facility]- copay amounts may vary at other pharmacies due to [employer], or as the patient moves through the different stages of their insurance plan.

## 2022-12-01 NOTE — Progress Notes (Signed)
ANTICOAGULATION CONSULT NOTE Pharmacy Consult for heparin Indication: chest pain/ACS Brief A/P: aPTT subtherapeutic Increase Heparin rate  Allergies  Allergen Reactions   Peanut-Containing Drug Products Anaphylaxis and Other (See Comments)    Tongue swelling is severe    Ace Inhibitors Cough        Diltiazem Hcl Other (See Comments)    UNSPECIFIED REACTION     Meloxicam Other (See Comments)    GI upset    Doxycycline Other (See Comments) and Cough    Reaction of cough and runny nose   Eliquis [Apixaban] Other (See Comments)    dizziness   Sertraline Hcl Other (See Comments)   Carvedilol Itching   Clonidine Hcl Other (See Comments)    Patch only - skin irritation   Duloxetine Hcl Other (See Comments)    Urinary frequency    Patient Measurements: Height: 5\' 7"  (170.2 cm) Weight: 73.9 kg (163 lb) IBW/kg (Calculated) : 66.1 Heparin Dosing Weight: 73.9 kg  Vital Signs: Temp: 97.3 F (36.3 C) (08/22 2327) Temp Source: Axillary (08/22 2327) BP: 126/78 (08/22 2327) Pulse Rate: 82 (08/22 2327)  Labs: Recent Labs    11/30/22 0803 11/30/22 0916 11/30/22 1116 11/30/22 2303  HGB 9.9* 9.6*  --   --   HCT 34.7* 33.5*  --   --   PLT 212 175  --   --   APTT  --   --   --  52*  HEPARINUNFRC  --   --   --  >1.10*  CREATININE 1.07 1.18  --   --   TROPONINIHS  --  34* 931*  --     Estimated Creatinine Clearance: 44.3 mL/min (by C-G formula based on SCr of 1.18 mg/dL).  Assessment: 82 y.o. male with chest pain, h/o Afib and Xarelto on hold, for heparin  Goal of Therapy:  Heparin level 0.3-0.7 units/ml aPTT 66-102 seconds Monitor platelets by anticoagulation protocol: Yes   Plan:  Increase Heparin 1100 units/hr Check aPTT in 8 hours   Darryel Diodato, Gary Fleet 12/01/2022,12:53 AM

## 2022-12-02 DIAGNOSIS — I200 Unstable angina: Secondary | ICD-10-CM | POA: Diagnosis not present

## 2022-12-02 DIAGNOSIS — E118 Type 2 diabetes mellitus with unspecified complications: Secondary | ICD-10-CM | POA: Diagnosis not present

## 2022-12-02 DIAGNOSIS — I480 Paroxysmal atrial fibrillation: Secondary | ICD-10-CM | POA: Diagnosis not present

## 2022-12-02 DIAGNOSIS — I214 Non-ST elevation (NSTEMI) myocardial infarction: Secondary | ICD-10-CM | POA: Diagnosis not present

## 2022-12-02 DIAGNOSIS — I5021 Acute systolic (congestive) heart failure: Secondary | ICD-10-CM

## 2022-12-02 LAB — BASIC METABOLIC PANEL
Anion gap: 10 (ref 5–15)
BUN: 27 mg/dL — ABNORMAL HIGH (ref 8–23)
CO2: 23 mmol/L (ref 22–32)
Calcium: 8.8 mg/dL — ABNORMAL LOW (ref 8.9–10.3)
Chloride: 102 mmol/L (ref 98–111)
Creatinine, Ser: 1.13 mg/dL (ref 0.61–1.24)
GFR, Estimated: >60 mL/min (ref >60)
Glucose, Bld: 157 mg/dL — ABNORMAL HIGH (ref 70–99)
Potassium: 4.1 mmol/L (ref 3.5–5.1)
Sodium: 135 mmol/L (ref 135–145)

## 2022-12-02 LAB — GLUCOSE, CAPILLARY
Glucose-Capillary: 134 mg/dL — ABNORMAL HIGH (ref 70–99)
Glucose-Capillary: 209 mg/dL — ABNORMAL HIGH (ref 70–99)

## 2022-12-02 MED ORDER — RIVAROXABAN 20 MG PO TABS: 20.0000 mg | ORAL_TABLET | Freq: Every day | ORAL | Status: DC

## 2022-12-02 MED ORDER — SACUBITRIL-VALSARTAN 49-51 MG PO TABS
1.0000 | ORAL_TABLET | Freq: Two times a day (BID) | ORAL | Status: DC
  Administered 2022-12-02: 1 via ORAL
  Filled 2022-12-02: qty 1

## 2022-12-02 MED ORDER — EMPAGLIFLOZIN 10 MG PO TABS
10.0000 mg | ORAL_TABLET | Freq: Every day | ORAL | Status: DC
  Administered 2022-12-02: 10 mg via ORAL
  Filled 2022-12-02: qty 1

## 2022-12-02 MED ORDER — RIVAROXABAN 15 MG PO TABS: 15.0000 mg | ORAL_TABLET | Freq: Every day | ORAL | Status: DC

## 2022-12-02 MED ORDER — RIVAROXABAN 15 MG PO TABS: 15.0000 mg | ORAL_TABLET | Freq: Every day | ORAL | 0 refills | Status: DC

## 2022-12-02 MED ORDER — EMPAGLIFLOZIN 10 MG PO TABS: 10.0000 mg | ORAL_TABLET | Freq: Every day | ORAL | 0 refills | Status: AC

## 2022-12-02 MED ORDER — SACUBITRIL-VALSARTAN 49-51 MG PO TABS: 1.0000 | ORAL_TABLET | Freq: Two times a day (BID) | ORAL | 0 refills | Status: AC

## 2022-12-02 NOTE — Plan of Care (Signed)
Problem: Education: Goal: Understanding of CV disease, CV risk reduction, and recovery process will improve 12/02/2022 1004 by Herma Carson, RN Outcome: Adequate for Discharge 12/02/2022 8657 by Herma Carson, RN Outcome: Progressing Goal: Individualized Educational Video(s) 12/02/2022 1004 by Herma Carson, RN Outcome: Adequate for Discharge 12/02/2022 8469 by Herma Carson, RN Outcome: Progressing   Problem: Activity: Goal: Ability to return to baseline activity level will improve 12/02/2022 1004 by Herma Carson, RN Outcome: Adequate for Discharge 12/02/2022 6295 by Herma Carson, RN Outcome: Progressing   Problem: Cardiovascular: Goal: Ability to achieve and maintain adequate cardiovascular perfusion will improve 12/02/2022 1004 by Herma Carson, RN Outcome: Adequate for Discharge 12/02/2022 2841 by Herma Carson, RN Outcome: Progressing Goal: Vascular access site(s) Level 0-1 will be maintained 12/02/2022 1004 by Herma Carson, RN Outcome: Adequate for Discharge 12/02/2022 3244 by Herma Carson, RN Outcome: Progressing   Problem: Health Behavior/Discharge Planning: Goal: Ability to safely manage health-related needs after discharge will improve 12/02/2022 1004 by Herma Carson, RN Outcome: Adequate for Discharge 12/02/2022 0102 by Herma Carson, RN Outcome: Progressing   Problem: Education: Goal: Ability to describe self-care measures that may prevent or decrease complications (Diabetes Survival Skills Education) will improve 12/02/2022 1004 by Herma Carson, RN Outcome: Adequate for Discharge 12/02/2022 7253 by Herma Carson, RN Outcome: Progressing Goal: Individualized Educational Video(s) 12/02/2022 1004 by Herma Carson, RN Outcome: Adequate for Discharge 12/02/2022 6644 by Herma Carson, RN Outcome: Progressing   Problem: Coping: Goal: Ability to adjust to condition or change in health will improve 12/02/2022 1004 by Herma Carson,  RN Outcome: Adequate for Discharge 12/02/2022 0347 by Herma Carson, RN Outcome: Progressing   Problem: Fluid Volume: Goal: Ability to maintain a balanced intake and output will improve 12/02/2022 1004 by Herma Carson, RN Outcome: Adequate for Discharge 12/02/2022 4259 by Herma Carson, RN Outcome: Progressing   Problem: Health Behavior/Discharge Planning: Goal: Ability to identify and utilize available resources and services will improve 12/02/2022 1004 by Herma Carson, RN Outcome: Adequate for Discharge 12/02/2022 5638 by Herma Carson, RN Outcome: Progressing Goal: Ability to manage health-related needs will improve 12/02/2022 1004 by Herma Carson, RN Outcome: Adequate for Discharge 12/02/2022 7564 by Herma Carson, RN Outcome: Progressing   Problem: Metabolic: Goal: Ability to maintain appropriate glucose levels will improve 12/02/2022 1004 by Herma Carson, RN Outcome: Adequate for Discharge 12/02/2022 3329 by Herma Carson, RN Outcome: Progressing   Problem: Nutritional: Goal: Maintenance of adequate nutrition will improve 12/02/2022 1004 by Herma Carson, RN Outcome: Adequate for Discharge 12/02/2022 5188 by Herma Carson, RN Outcome: Progressing Goal: Progress toward achieving an optimal weight will improve 12/02/2022 1004 by Herma Carson, RN Outcome: Adequate for Discharge 12/02/2022 4166 by Herma Carson, RN Outcome: Progressing   Problem: Skin Integrity: Goal: Risk for impaired skin integrity will decrease 12/02/2022 1004 by Herma Carson, RN Outcome: Adequate for Discharge 12/02/2022 0630 by Herma Carson, RN Outcome: Progressing   Problem: Tissue Perfusion: Goal: Adequacy of tissue perfusion will improve 12/02/2022 1004 by Herma Carson, RN Outcome: Adequate for Discharge 12/02/2022 1601 by Herma Carson, RN Outcome: Progressing   Problem: Education: Goal: Knowledge of General Education information will improve Description:  Including pain rating scale, medication(s)/side effects and non-pharmacologic comfort measures 12/02/2022 1004 by Herma Carson, RN Outcome: Adequate for Discharge 12/02/2022 0932 by Herma Carson, RN Outcome:  Progressing   Problem: Health Behavior/Discharge Planning: Goal: Ability to manage health-related needs will improve 12/02/2022 1004 by Herma Carson, RN Outcome: Adequate for Discharge 12/02/2022 6578 by Herma Carson, RN Outcome: Progressing   Problem: Clinical Measurements: Goal: Ability to maintain clinical measurements within normal limits will improve 12/02/2022 1004 by Herma Carson, RN Outcome: Adequate for Discharge 12/02/2022 4696 by Herma Carson, RN Outcome: Progressing Goal: Will remain free from infection 12/02/2022 1004 by Herma Carson, RN Outcome: Adequate for Discharge 12/02/2022 2952 by Herma Carson, RN Outcome: Progressing Goal: Diagnostic test results will improve 12/02/2022 1004 by Herma Carson, RN Outcome: Adequate for Discharge 12/02/2022 8413 by Herma Carson, RN Outcome: Progressing Goal: Respiratory complications will improve 12/02/2022 1004 by Herma Carson, RN Outcome: Adequate for Discharge 12/02/2022 2440 by Herma Carson, RN Outcome: Progressing Goal: Cardiovascular complication will be avoided 12/02/2022 1004 by Herma Carson, RN Outcome: Adequate for Discharge 12/02/2022 1027 by Herma Carson, RN Outcome: Progressing   Problem: Activity: Goal: Risk for activity intolerance will decrease 12/02/2022 1004 by Herma Carson, RN Outcome: Adequate for Discharge 12/02/2022 2536 by Herma Carson, RN Outcome: Progressing   Problem: Nutrition: Goal: Adequate nutrition will be maintained 12/02/2022 1004 by Herma Carson, RN Outcome: Adequate for Discharge 12/02/2022 6440 by Herma Carson, RN Outcome: Progressing   Problem: Coping: Goal: Level of anxiety will decrease 12/02/2022 1004 by Herma Carson, RN Outcome:  Adequate for Discharge 12/02/2022 3474 by Herma Carson, RN Outcome: Progressing   Problem: Elimination: Goal: Will not experience complications related to bowel motility 12/02/2022 1004 by Herma Carson, RN Outcome: Adequate for Discharge 12/02/2022 2595 by Herma Carson, RN Outcome: Progressing Goal: Will not experience complications related to urinary retention 12/02/2022 1004 by Herma Carson, RN Outcome: Adequate for Discharge 12/02/2022 6387 by Herma Carson, RN Outcome: Progressing   Problem: Pain Managment: Goal: General experience of comfort will improve 12/02/2022 1004 by Herma Carson, RN Outcome: Adequate for Discharge 12/02/2022 5643 by Herma Carson, RN Outcome: Progressing   Problem: Safety: Goal: Ability to remain free from injury will improve 12/02/2022 1004 by Herma Carson, RN Outcome: Adequate for Discharge 12/02/2022 3295 by Herma Carson, RN Outcome: Progressing   Problem: Skin Integrity: Goal: Risk for impaired skin integrity will decrease 12/02/2022 1004 by Herma Carson, RN Outcome: Adequate for Discharge 12/02/2022 1884 by Herma Carson, RN Outcome: Progressing   Problem: Education: Goal: Understanding of CV disease, CV risk reduction, and recovery process will improve 12/02/2022 1004 by Herma Carson, RN Outcome: Adequate for Discharge 12/02/2022 1660 by Herma Carson, RN Outcome: Progressing Goal: Individualized Educational Video(s) 12/02/2022 1004 by Herma Carson, RN Outcome: Adequate for Discharge 12/02/2022 6301 by Herma Carson, RN Outcome: Progressing   Problem: Activity: Goal: Ability to return to baseline activity level will improve 12/02/2022 1004 by Herma Carson, RN Outcome: Adequate for Discharge 12/02/2022 6010 by Herma Carson, RN Outcome: Progressing   Problem: Cardiovascular: Goal: Ability to achieve and maintain adequate cardiovascular perfusion will improve 12/02/2022 1004 by Herma Carson,  RN Outcome: Adequate for Discharge 12/02/2022 9323 by Herma Carson, RN Outcome: Progressing Goal: Vascular access site(s) Level 0-1 will be maintained 12/02/2022 1004 by Herma Carson, RN Outcome: Adequate for Discharge 12/02/2022 5573 by Herma Carson, RN Outcome: Progressing   Problem: Health Behavior/Discharge Planning: Goal: Ability to safely manage health-related needs after  discharge will improve 12/02/2022 1004 by Herma Carson, RN Outcome: Adequate for Discharge 12/02/2022 4098 by Herma Carson, RN Outcome: Progressing

## 2022-12-02 NOTE — TOC Transition Note (Signed)
Transition of Care [facility]) - CM/SW Discharge Note   Patient Details  Name: Robert Burns MRN: [id number] Date of Birth: 1940/05/05  Transition of [facility] CM/SW Contact:  Ronny Bacon, RN Phone Number: 12/02/2022, 11:15 AM   Clinical Narrative:  patient is being discharged today. Entresto 30 day card sent to floor RN to give to patient at discharge.     Final next level of care: Home/Self Care Barriers to Discharge: No Barriers Identified   Patient Goals and CMS Choice      Discharge Placement                         Discharge Plan and Services Additional resources added to the After Visit Summary for                                       Social Determinants of Health (SDOH) Interventions SDOH Screenings   Food Insecurity: No Food Insecurity (11/30/2022)  Housing: Low Risk  (11/30/2022)  Transportation Needs: No Transportation Needs (11/30/2022)  Utilities: Not At Risk (11/30/2022)  Social Connections: Unknown (08/18/2021)   Received from [facility]  Tobacco Use: Medium Risk (11/30/2022)     Readmission Risk Interventions     No data to display

## 2022-12-02 NOTE — Progress Notes (Addendum)
Patient Name: Robert Burns Date of Encounter: 12/02/2022 [facility] Cardiologist: Nanetta Batty, MD   Interval Summary  .    Underwent cath yesterday- found to have acutely occluded SVG to the RCA, that could not be salvaged. No further chest pain noted overnight. Creatinine stable today at 1.13.    Vital Signs .    Vitals:   12/01/22 2200 12/02/22 0026 12/02/22 0305 12/02/22 0821  BP:  127/69 (!) 140/74 (!) 146/65  Pulse: 70 61 79 62  Resp: 18 17 18 19   Temp:  97.7 F (36.5 C) 97.6 F (36.4 C) 98.3 F (36.8 C)  TempSrc:  Axillary Axillary Axillary  SpO2: 96% 98% 99%   Weight:      Height:        Intake/Output Summary (Last 24 hours) at 12/02/2022 0847 Last data filed at 12/01/2022 1300 Gross per 24 hour  Intake --  Output 300 ml  Net -300 ml      12/01/2022    4:55 AM 11/30/2022    9:15 AM 11/28/2022   10:05 AM  Last 3 Weights  Weight (lbs) 174 lb 2.6 oz 163 lb 170 lb  Weight (kg) 79 kg 73.936 kg 77.111 kg      Telemetry/ECG    Atrial paced - Personally Reviewed  Physical Exam .    GEN: No acute distress.   Neck: No JVD Cardiac: RRR, no murmurs, rubs, or gallops.  Respiratory: Clear to auscultation bilaterally. GI: Soft, nontender, non-distended  MS: No edema  Assessment & Plan .     NSTEMI Complex CAD  Patient status post CABG in 2004, (LIMA to LAD, SVG to diagonal 1 SVG to RPDA, SVG to OM1 and OM 2, PCI with drug-eluting stent to the proximal SVG and PDA in 2016, staged PCI of the SVG to diagonal and November 2016. Increased anginal symptoms over the last year, now consistent with accelerating angina. Troponin 34->931. ECG with mild ST depression in lateral leads.   No further chest pain LHC showed occluded SVG to RCA, no intervention possible Continue ASA 81mg , Plavix 75mg , Imdur 90mg , Metoprolol Tartrate 100mg  BID, Ranexa 1000mg  BID.  HFmrEF  TTE this admission shows LVEF 45-50% with global hypokinesis (although appears to have  basal inferior and inferolateral hypokinesis to my read) and grade II DD. Normal right atrial pressure with collapsible IVC. This LVEF is similar to 2022 stress myoview.   Patient appears euvolemic on physical exam. Will start Entresto 49/51 mg BID today.  Stop amlodipine and hydrochlorothiazide Start jardiance 10 mg daily. Do not feel he will need lasix as well. Can titrate additional GDMT as outpatient.  Paroxysmal atrial fibrillation SSS s/p PPM  Patient with atrial paced rhythm this admission, no recurrent afib. Will restart Xarelto 15 mg (reduced for CrCl>50 per pharmacy) at bedtime.  PAD  Stable symptoms. Continue to monitor.  Hyperlipidemia  LDL 23. Continue Atorvastatin 80mg .  Hypertension  Med changes as for HFmrEF above.  Could probably be discharged this afternoon. Has follow-up appt scheduled on 9/4 with Bailey Mech, DNP.  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

## 2022-12-02 NOTE — Discharge Summary (Signed)
Physician Discharge Summary  Robert Burns HYQ:[id number] DOB: 1940/07/04 DOA: 11/30/2022  PCP: Robert Funk, MD (Inactive)  Admit date: 11/30/2022 Discharge date: 12/02/2022 30 Day Unplanned Readmission Risk Score    Flowsheet Row ED to Hosp-Admission (Current) from 11/30/2022 in [facility]  30 Day Unplanned Readmission Risk Score (%) 22.38 Filed at 12/02/2022 0800       This score is the patient's risk of an unplanned readmission within 30 days of being discharged (0 -100%). The score is based on dignosis, age, lab data, medications, orders, and past utilization.   Low:  0-14.9   Medium: 15-21.9   High: 22-29.9   Extreme: 30 and above          Admitted From: Home Disposition: Home  Recommendations for Outpatient Follow-up:  Follow up with PCP in 1-2 weeks Please obtain BMP/CBC in one week Follow-up with cardiology/has appointment on 12/13/2022. Please follow up with your PCP on the following pending results: Unresulted Labs (From admission, onward)     Start     Ordered   11/30/22 1615  Occult blood card to lab, stool RN will collect  Once,   R       Question:  Specimen to be collected by:  Answer:  RN will collect   11/30/22 1614              Home Health: None Equipment/Devices: None  Discharge Condition: stable CODE STATUS: Full code Diet recommendation: Cardiac  Subjective: Seen and examined.  No chest pain or any other complaint.  He has already discussed with cardiology and he understands discharge plans.  Brief/Interim Summary: Robert Burns is a 82 y.o. male with medical history significant of hypertension, hyperlipidemia, CAD s/p CABG with multiple PCI's, PAF, sick sinus syndrome s/p pacemaker, CLL, and BPH who presented with complaints of chest pain starting morning of admission while riding in the car.   He had taken nitroglycerin with temporary relief 4 times prior to coming to the hospital.  He has never had to do that previously  and reported last week not needing to use the nitroglycerin at all.  He makes note that this was the worst chest pain that he has had when compared to his other episodes in the past.  His daughter makes note that he had recently been dealing with a lot of pain in his hip, back, and knees for which it was difficult for him to get around.  His primary care provider had started him on prednisone pack until he can get in with orthopedics.  Furthermore his daughter makes note that he was scheduled to follow-up with gastroenterology in the near future and for workup for possible internal bleeding.   In the emergency department patient was noted to be afebrile with blood pressures elevated up to 178/76.  Labs noted WBC 33.8 ->32.6, hemoglobin 9.9->9.6, glucose 229, BUN 29, creatinine 1.18,  and high-sensitivity troponin 34->931.  Cardiology had been formally consulted.  Patient had been given full dose aspirin and started on a heparin drip per pharmacy.  Admitted to hospital service.  Details below.  History of CAD status post CABG in the past, now here with NSTEMI: EKG noted ST depressions in the lateral leads.  Patient with history of three-vessel CABG back in 2004 and multiple subsequent PCIs.  Troponin 34> 931.  Patient had been loaded with aspirin and placed on a heparin drip.  Seen by cardiology, underwent LHC which showed occluded SVG to RCA, no  intervention possible, per cardiology.  They recommended medical management and recommended continuing aspirin, Plavix, Imdur, metoprolol tartrate and Ranexa.   Paroxysmal atrial fibrillation on chronic anticoagulation Sick sinus syndrome s/p pacemaker Patient noted to be in sinus rhythm currently.  Cardiology recommended reducing his Xarelto dose to 15 mg p.o. daily.   Diabetes mellitus type 2 with hyperglycemia, without long-term use of insulin On admission glucose noted to be significantly elevated to 229.  Hemoglobin A1c 6.6.  Home regimen includes metformin  and glipizide, resume that.   CLL / Iron deficiency anemia due to chronic blood loss: On admission hemoglobin noted to be 9.6 which appears around patient's baseline with low MCV and MCH.  WBC elevated at 33.8.  Patient is followed by hematology oncology in the outpatient setting and is on iron infusions.  He is scheduled to have a repeat iron infusion sometime next week.  Family notes he is scheduled to see [name] gastroenterology for workup for concern for possibility of internal bleeding.  Follow-up with hematology and GI as outpatient.   Essential hypertension Blood pressures currently maintained.  Cardiology recommended discontinuing amlodipine and hydrochlorothiazide but continuing Imdur 90, metoprolol 100 twice daily.  They have added [name].  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  Cardiology started him on [name].  Discontinue amlodipine and hydrochlorothiazide.  They did not feel that he needs Lasix.  They are planning to titrate additional GDMT as outpatient.   Hyperlipidemia LDL was 38 in 06/2021. -Continue atorvastatin   Arthritis pain Patient had been complaining of pain in his hips, back, and knees for which it was difficult for him to get around.  His primary care provider had recently started him on prednisone. -Continue prednisone   BPH -Continue Flomax   OSA  continue CPAP at night  Discharge plan was discussed with patient and/or family member and they verbalized understanding and agreed with it.  Discharge Diagnoses:  Active Problems:   Unstable angina (HCC)   CAD, multiple vessel   NSTEMI (non-ST elevated myocardial infarction) (HCC)   SSS (sick sinus syndrome), medtronic adapta   Paroxysmal atrial fibrillation (HCC)   Type 2 diabetes mellitus with complication, without long-term current use of insulin (HCC)   CLL (chronic lymphocytic leukemia) (HCC)   Iron  deficiency anemia due to chronic blood loss   Essential hypertension   Dyslipidemia (high LDL; low HDL)   Arthritis pain   BPH (benign prostatic hyperplasia)   OSA on CPAP    Discharge Instructions  Discharge Instructions     Amb Referral to Cardiac Rehabilitation   Complete by: As directed    Diagnosis: NSTEMI   After initial evaluation and assessments completed: Virtual Based Care may be provided alone or in conjunction with Phase 2 Cardiac Rehab based on patient barriers.: Yes   Intensive Cardiac Rehabilitation (ICR) [facility] location only OR Traditional Cardiac Rehabilitation (TCR) *If criteria for ICR are not met will enroll in TCR [facility] only): Yes      Allergies as of 12/02/2022       Reactions   Peanut-containing Drug Products Anaphylaxis, Other (See Comments)   Tongue swelling is severe   Ace Inhibitors Cough      Diltiazem Hcl Other (See Comments)   UNSPECIFIED REACTION    Meloxicam Other (See Comments)   GI upset   Doxycycline Other (See Comments), Cough   Reaction of cough and runny nose  Eliquis [apixaban] Other (See Comments)   dizziness   Sertraline Hcl Other (See Comments)   Carvedilol Itching   Clonidine Hcl Other (See Comments)   Patch only - skin irritation   Duloxetine Hcl Other (See Comments)   Urinary frequency        Medication List     STOP taking these medications    amLODipine 5 MG tablet Commonly known as: NORVASC   hydrochlorothiazide 25 MG tablet Commonly known as: HYDRODIURIL       TAKE these medications    atorvastatin 80 MG tablet Commonly known as: LIPITOR Take 1 tablet (80 mg total) by mouth daily at 6 PM.   clopidogrel 75 MG tablet Commonly known as: PLAVIX Take 1 tablet (75 mg total) by mouth daily with breakfast.   dorzolamide-timolol 2-0.5 % ophthalmic solution Commonly known as: COSOPT Place 1 drop into both eyes daily.   empagliflozin 10 MG Tabs tablet Commonly known as: JARDIANCE Take 1 tablet (10 mg total)  by mouth daily. Start taking on: December 03, 2022   glipiZIDE 5 MG tablet Commonly known as: GLUCOTROL Take 5 mg by mouth daily with breakfast.   hydrALAZINE 25 MG tablet Commonly known as: APRESOLINE Take 1 tablet (25 mg total) by mouth as needed (for BP >150 sys or 90 dia).   isosorbide mononitrate 60 MG 24 hr tablet Commonly known as: IMDUR Take 1.5 tablets (90 mg total) by mouth daily.   melatonin 5 MG Tabs Take 5 mg by mouth at bedtime.   metFORMIN 1000 MG tablet Commonly known as: GLUCOPHAGE Take 1 tablet (1,000 mg total) by mouth 2 (two) times daily with a meal. Restart on 3/18.   metoprolol tartrate 100 MG tablet Commonly known as: LOPRESSOR Take 1 tablet (100 mg total) by mouth 2 (two) times daily.   MULTIPLE VITAMIN-FOLIC ACID PO Take 1 tablet by mouth daily.   nitroGLYCERIN 0.4 MG SL tablet Commonly known as: Nitrostat Place 1 tablet (0.4 mg total) under the tongue every 5 (five) minutes as needed for chest pain.   potassium chloride SA 20 MEQ tablet Commonly known as: KLOR-CON M Take 1 tablet (20 mEq total) by mouth daily.   predniSONE 5 MG (21) Tbpk tablet Commonly known as: STERAPRED UNI-PAK 21 TAB Take 5 mg by mouth See admin instructions. Take 6 tablets by mouth on day one, then take 5 tablets by mouth on day two, take 4 tablets by mouth on day three, take 3 tablets by mouth on day four, take 2 tablets by mouth on day five, then take 1 tablet by mouth on day six per patient   ranolazine 1000 MG SR tablet Commonly known as: RANEXA Take 1 tablet (1,000 mg total) by mouth 2 (two) times daily.   Rivaroxaban 15 MG Tabs tablet Commonly known as: XARELTO Take 1 tablet (15 mg total) by mouth daily with supper. What changed:  medication strength See the new instructions.   sacubitril-valsartan 49-51 MG Commonly known as: ENTRESTO Take 1 tablet by mouth 2 (two) times daily.   tamsulosin 0.4 MG Caps capsule Commonly known as: FLOMAX Take 0.4 mg by mouth 2  (two) times daily.        Follow-up Information     Robert Funk, MD Follow up in 1 week(s).   Specialty: Internal Medicine Contact information: [address] [place] [address] [phone]                Allergies  Allergen Reactions  Peanut-Containing Drug Products Anaphylaxis and Other (See Comments)    Tongue swelling is severe    Ace Inhibitors Cough        Diltiazem Hcl Other (See Comments)    UNSPECIFIED REACTION     Meloxicam Other (See Comments)    GI upset    Doxycycline Other (See Comments) and Cough    Reaction of cough and runny nose   Eliquis [Apixaban] Other (See Comments)    dizziness   Sertraline Hcl Other (See Comments)   Carvedilol Itching   Clonidine Hcl Other (See Comments)    Patch only - skin irritation   Duloxetine Hcl Other (See Comments)    Urinary frequency    Consultations: Cardiology   Procedures/Studies: CARDIAC CATHETERIZATION  Result Date: 12/01/2022   Mid LM lesion is 50% stenosed.   Ost LAD to Prox LAD lesion is 100% stenosed.   Mid LAD-2 lesion is 20% stenosed.   Dist LAD lesion is 25% stenosed.   Ost Cx lesion is 40% stenosed.   Prox Cx lesion is 30% stenosed.   Mid Cx lesion is 75% stenosed.   Ost RCA to Dist RCA lesion is 100% stenosed.   Ost Ramus lesion is 95% stenosed.   Prox Graft to Dist Graft lesion between Ramus and 2nd Mrg  is 100% stenosed.   Origin to Mid Graft lesion before Prox LAD  is 100% stenosed.   Origin to Prox Graft lesion is 100% stenosed.   Ost 3rd Mrg to 3rd Mrg lesion is 99% stenosed.   Prox Graft lesion is 70% stenosed.   Non-stenotic Mid LAD-1 lesion.   Non-stenotic Mid Graft to Dist Graft lesion was previously treated.   Non-stenotic Mid Graft lesion was previously treated.   Non-stenotic Mid Graft to Dist Graft lesion was previously treated.   LV end diastolic pressure is moderately elevated. Severe 3 vessel obstructive CAD Patent LIMA to the LAD Known occlusion of SVG to LCx  Patent SVG to diagonal. Multiple prior stents. Within the proximal stent there is a focal 70% stenosis. Other stented segments look good. Occluded SVG to RCA. This is new. There are some left to right collaterals to distal RCA Moderately elevated LVEDP 27 mm Hg Plan: SVG to RCA cannot be salvaged. This appears to be the culprit. Recommend continued medical therapy. If refractory angina could consider balloon angioplasty of in stent disease in SVG to diagonal but long term patency would be limited.   ECHOCARDIOGRAM COMPLETE  Result Date: 11/30/2022    ECHOCARDIOGRAM REPORT   Patient Name:   Robert Burns Date of Exam: 11/30/2022 Medical Rec #:  [id number]       Height:       67.0 in Accession #:    [id number]      Weight:       163.0 lb Date of Birth:  [birth year]       BSA:          1.854 m Patient Age:    52 years        BP:           139/66 mmHg Patient Gender: M               HR:           85 bpm. Exam Location:  Inpatient Procedure: 2D Echo, Cardiac Doppler, Color Doppler and Intracardiac            Opacification Agent Indications:    NSTEMI  History:  Patient has prior history of Echocardiogram examinations, most                 recent 06/22/2020. CAD, Pacemaker, PAD, Arrythmias:SSS; Risk                 Factors:Diabetes, Hypertension, Dyslipidemia and Sleep Apnea.                 Cancer.  Sonographer:    Milda Smart Referring Phys: [id number] SHENG L HALEY  Sonographer Comments: Image acquisition challenging due to patient body habitus. IMPRESSIONS  1. Left ventricular ejection fraction, by estimation, is 45 to 50%. The left ventricle has mildly decreased function. The left ventricle demonstrates global hypokinesis. The left ventricular internal cavity size was mildly to moderately dilated. Left ventricular diastolic parameters are consistent with Grade II diastolic dysfunction (pseudonormalization).  2. Right ventricular systolic function is normal. The right ventricular size is normal. There is  normal pulmonary artery systolic pressure.  3. Left atrial size was moderately dilated.  4. Right atrial size was moderately dilated.  5. The mitral valve is normal in structure. Trivial mitral valve regurgitation. No evidence of mitral stenosis.  6. The aortic valve is tricuspid. There is mild calcification of the aortic valve. There is mild thickening of the aortic valve. Aortic valve regurgitation is mild.  7. The inferior vena cava is normal in size with greater than 50% respiratory variability, suggesting right atrial pressure of 3 mmHg. Comparison(s): Changes from prior study are noted. Mild to moderately dilated LV with mildly reduced function but no apparent focal wall motion abnormalities. FINDINGS  Left Ventricle: Left ventricular ejection fraction, by estimation, is 45 to 50%. The left ventricle has mildly decreased function. The left ventricle demonstrates global hypokinesis. Definity contrast agent was given IV to delineate the left ventricular  endocardial borders. The left ventricular internal cavity size was mildly to moderately dilated. There is no left ventricular hypertrophy. Left ventricular diastolic parameters are consistent with Grade II diastolic dysfunction (pseudonormalization). Right Ventricle: The right ventricular size is normal. Right vetricular wall thickness was not well visualized. Right ventricular systolic function is normal. There is normal pulmonary artery systolic pressure. The tricuspid regurgitant velocity is 2.78 m/s, and with an assumed right atrial pressure of 3 mmHg, the estimated right ventricular systolic pressure is 33.9 mmHg. Left Atrium: Left atrial size was moderately dilated. Right Atrium: Right atrial size was moderately dilated. Pericardium: There is no evidence of pericardial effusion. Mitral Valve: The mitral valve is normal in structure. Trivial mitral valve regurgitation. No evidence of mitral valve stenosis. Tricuspid Valve: The tricuspid valve is normal in  structure. Tricuspid valve regurgitation is trivial. No evidence of tricuspid stenosis. Aortic Valve: The aortic valve is tricuspid. There is mild calcification of the aortic valve. There is mild thickening of the aortic valve. Aortic valve regurgitation is mild. Aortic regurgitation PHT measures 605 msec. Pulmonic Valve: The pulmonic valve was grossly normal. Pulmonic valve regurgitation is trivial. No evidence of pulmonic stenosis. Aorta: The aortic root and ascending aorta are structurally normal, with no evidence of dilitation. Venous: The inferior vena cava is normal in size with greater than 50% respiratory variability, suggesting right atrial pressure of 3 mmHg. IAS/Shunts: The atrial septum is grossly normal. Additional Comments: A device lead is visualized.  LEFT VENTRICLE PLAX 2D LVIDd:         5.40 cm   Diastology LVIDs:         4.40 cm   LV e' medial:  4.68 cm/s LV PW:         1.00 cm   LV E/e' medial:  20.0 LV IVS:        1.10 cm   LV e' lateral:   7.51 cm/s LVOT diam:     2.30 cm   LV E/e' lateral: 12.5 LV SV:         90 LV SV Index:   49 LVOT Area:     4.15 cm  RIGHT VENTRICLE             IVC RV S prime:     11.10 cm/s  IVC diam: 2.00 cm TAPSE (M-mode): 1.9 cm LEFT ATRIUM             Index        RIGHT ATRIUM           Index LA diam:        5.30 cm 2.86 cm/m   RA Area:     23.20 cm LA Vol (A2C):   71.6 ml 38.62 ml/m  RA Volume:   69.10 ml  37.27 ml/m LA Vol (A4C):   74.9 ml 40.40 ml/m LA Biplane Vol: 76.7 ml 41.37 ml/m  AORTIC VALVE LVOT Vmax:   85.10 cm/s LVOT Vmean:  62.400 cm/s LVOT VTI:    0.217 m AI PHT:      605 msec  AORTA Ao Root diam: 3.40 cm Ao Asc diam:  3.70 cm MITRAL VALVE                TRICUSPID VALVE MV Area (PHT): 3.51 cm     TR Peak grad:   30.9 mmHg MV Decel Time: 216 msec     TR Mean grad:   23.0 mmHg MR Peak grad: 29.8 mmHg     TR Vmax:        278.00 cm/s MR Vmax:      273.00 cm/s   TR Vmean:       231.0 cm/s MV E velocity: 93.60 cm/s MV A velocity: 105.00 cm/s  SHUNTS  MV E/A ratio:  0.89         Systemic VTI:  0.22 m                             Systemic Diam: 2.30 cm Jodelle Red MD Electronically signed by Jodelle Red MD Signature Date/Time: 11/30/2022/4:57:31 PM    Final    DG Chest Portable 1 View  Result Date: 11/30/2022 CLINICAL DATA:  Chest pain EXAM: PORTABLE CHEST 1 VIEW COMPARISON:  X-ray 02/05/2022 FINDINGS: Sternal wires. Right upper chest battery pack with pacemaker leads along the right side of the heart are similar to previous. Minimal left basilar atelectasis. No consolidation, pneumothorax, effusion or edema. Calcified aorta. Overlapping cardiac leads. Degenerative changes of the spine and shoulders. IMPRESSION: Postop chest.  Pacemaker.  Minimal left basilar atelectasis or scar Electronically Signed   By: Karen Kays M.D.   On: 11/30/2022 10:12   CUP PACEART REMOTE DEVICE CHECK  Result Date: 11/15/2022 Scheduled remote reviewed. Normal device function.  3 AHR episodes on 09/10/22 between 9:32 pm and 9:46 pm, longest 9 min 59 sec, hx of PAF and on Xarelto per Epic. Next remote 91 days. [facility], [facility].    Discharge Exam: Vitals:   12/02/22 0305 12/02/22 0821  BP: (!) 140/74 (!) 146/65  Pulse: 79 62  Resp: 18 19  Temp: 97.6 F (36.4 C) 98.3 F (  36.8 C)  SpO2: 99%    Vitals:   12/01/22 2200 12/02/22 0026 12/02/22 0305 12/02/22 0821  BP:  127/69 (!) 140/74 (!) 146/65  Pulse: 70 61 79 62  Resp: 18 17 18 19   Temp:  97.7 F (36.5 C) 97.6 F (36.4 C) 98.3 F (36.8 C)  TempSrc:  Axillary Axillary Axillary  SpO2: 96% 98% 99%   Weight:      Height:        General: Pt is alert, awake, not in acute distress Cardiovascular: RRR, S1/S2 +, no rubs, no gallops Respiratory: CTA bilaterally, no wheezing, no rhonchi Abdominal: Soft, NT, ND, bowel sounds + Extremities: no edema, no cyanosis    The results of significant diagnostics from this hospitalization (including imaging, microbiology, ancillary and laboratory) are listed  below for reference.     Microbiology: Recent Results (from the past 240 hour(s))  MRSA Next Gen by PCR, Nasal     Status: None   Collection Time: 11/30/22  7:54 PM   Specimen: Nasal Mucosa; Nasal Swab  Result Value Ref Range Status   MRSA by PCR Next Gen NOT DETECTED NOT DETECTED Final    Comment: (NOTE) The GeneXpert MRSA Assay (FDA approved for NASAL specimens only), is one component of a comprehensive MRSA colonization surveillance program. It is not intended to diagnose MRSA infection nor to guide or monitor treatment for MRSA infections. Test performance is not FDA approved in patients less than 91 years old. Performed at [facility], [address] [place], [place] [address]      Labs: BNP (last 3 results) No results for input(s): "BNP" in the last 8760 hours. Basic Metabolic Panel: Recent Labs  Lab 11/30/22 0803 11/30/22 0916 12/01/22 0329 12/02/22 0349  NA 141 137 136 135  K 4.7 4.6 4.0 4.1  CL 105 104 105 102  CO2 23 20* 22 23  GLUCOSE 208* 229* 194* 157*  BUN 32* 29* 28* 27*  CREATININE 1.07 1.18 1.03 1.13  CALCIUM 9.7 9.5 9.2 8.8*   Liver Function Tests: Recent Labs  Lab 11/30/22 0803  AST 20  ALT 16  ALKPHOS 58  BILITOT 0.8  PROT 7.4  ALBUMIN 4.8   No results for input(s): "LIPASE", "AMYLASE" in the last 168 hours. No results for input(s): "AMMONIA" in the last 168 hours. CBC: Recent Labs  Lab 11/30/22 0803 11/30/22 0916 12/01/22 0329  WBC 33.8* 32.6* 25.2*  NEUTROABS 11.2*  --   --   HGB 9.9* 9.6* 8.5*  HCT 34.7* 33.5* 28.7*  MCV 79.2* 77.7* 77.2*  PLT 212 175 170   Cardiac Enzymes: No results for input(s): "CKTOTAL", "CKMB", "CKMBINDEX", "TROPONINI" in the last 168 hours. BNP: Invalid input(s): "POCBNP" CBG: Recent Labs  Lab 12/01/22 1234 12/01/22 1648 12/01/22 1807 12/01/22 2040 12/02/22 0820  GLUCAP 158* 309* 297* 316* 134*   D-Dimer No results for input(s): "DDIMER" in the last 72 hours. Hgb A1c Recent Labs     11/30/22 0916  HGBA1C 6.6*   Lipid Profile Recent Labs    12/01/22 0857  CHOL 61  HDL 29*  LDLCALC 23  TRIG 45  CHOLHDL 2.1   Thyroid function studies No results for input(s): "TSH", "T4TOTAL", "T3FREE", "THYROIDAB" in the last 72 hours.  Invalid input(s): "FREET3" Anemia work up Recent Labs    11/30/22 0803 11/30/22 0804  FERRITIN  --  118  TIBC 512*  --   IRON 56  --    Urinalysis    Component  Value Date/Time   COLORURINE YELLOW 02/07/2022 0813   APPEARANCEUR CLEAR 02/07/2022 0813   LABSPEC 1.011 02/07/2022 0813   PHURINE 5.0 02/07/2022 0813   GLUCOSEU NEGATIVE 02/07/2022 0813   HGBUR NEGATIVE 02/07/2022 0813   BILIRUBINUR NEGATIVE 02/07/2022 0813   KETONESUR NEGATIVE 02/07/2022 0813   PROTEINUR 30 (A) 02/07/2022 0813   UROBILINOGEN 0.2 09/19/2010 1045   NITRITE NEGATIVE 02/07/2022 0813   LEUKOCYTESUR NEGATIVE 02/07/2022 0813   Sepsis Labs Recent Labs  Lab 11/30/22 0803 11/30/22 0916 12/01/22 0329  WBC 33.8* 32.6* 25.2*   Microbiology Recent Results (from the past 240 hour(s))  MRSA Next Gen by PCR, Nasal     Status: None   Collection Time: 11/30/22  7:54 PM   Specimen: Nasal Mucosa; Nasal Swab  Result Value Ref Range Status   MRSA by PCR Next Gen NOT DETECTED NOT DETECTED Final    Comment: (NOTE) The GeneXpert MRSA Assay (FDA approved for NASAL specimens only), is one component of a comprehensive MRSA colonization surveillance program. It is not intended to diagnose MRSA infection nor to guide or monitor treatment for MRSA infections. Test performance is not FDA approved in patients less than 27 years old. Performed at [facility], [address] [place], [place] [address]     FURTHER DISCHARGE INSTRUCTIONS:   Get Medicines reviewed and adjusted: Please take all your medications with you for your next visit with your Primary MD   Laboratory/radiological data: Please request your Primary MD to go over all hospital tests and  procedure/radiological results at the follow up, please ask your Primary MD to get all Hospital records sent to his/her office.   In some cases, they will be blood work, cultures and biopsy results pending at the time of your discharge. Please request that your primary care M.D. goes through all the records of your hospital data and follows up on these results.   Also Note the following: If you experience worsening of your admission symptoms, develop shortness of breath, life threatening emergency, suicidal or homicidal thoughts you must seek medical attention immediately by calling 911 or calling your MD immediately  if symptoms less severe.   You must read complete instructions/literature along with all the possible adverse reactions/side effects for all the Medicines you take and that have been prescribed to you. Take any new Medicines after you have completely understood and accpet all the possible adverse reactions/side effects.    Do not drive when taking Pain medications or sleeping medications (Benzodaizepines)   Do not take more than prescribed Pain, Sleep and Anxiety Medications. It is not advisable to combine anxiety,sleep and pain medications without talking with your primary care practitioner   Special Instructions: If you have smoked or chewed Tobacco  in the last 2 yrs please stop smoking, stop any regular Alcohol  and or any Recreational drug use.   Wear Seat belts while driving.   Please note: You were cared for by a hospitalist during your hospital stay. Once you are discharged, your primary care physician will handle any further medical issues. Please note that NO REFILLS for any discharge medications will be authorized once you are discharged, as it is imperative that you return to your primary care physician (or establish a relationship with a primary care physician if you do not have one) for your post hospital discharge needs so that they can reassess your need for  medications and monitor your lab values  Time coordinating discharge: Over 30 minutes  SIGNED:  Hughie Closs, MD  [facility] 12/02/2022, 10:04 AM *Please note that this is a verbal dictation therefore any spelling or grammatical errors are due to the "Dragon Medical One" system interpretation. If 7PM-7AM, please contact night-coverage [email or web address]

## 2022-12-02 NOTE — Progress Notes (Addendum)
CARDIAC REHAB PHASE I   PRE:  Rate/Rhythm: 63 AV paced  BP:  Sitting: 146/65      SaO2: 96 RA  MODE:  Ambulation: 470 ft   AD:   Cane  POST:  Rate/Rhythm: 116 AV paced  BP:  Sitting: 159/85      SaO2: 99 RA  Pt amb with standby assistance, pt reports light chest pain after finishing walk (nurse aware), denies SOB. Pt also c/p of R knee and hip pain. Pt is not interested in CR.  Pt was educated on  wt restrictions, no baths/daily wash-ups, s/s of infection, ex guidelines, s/s to stop exercising, NTG use and calling 911, heart healthy diet, risk factors and CRPII. Pt received MI book and materials on exercise, diet, and CRPII. Will refer to [facility].   Faustino Congress  ACSM-CEP 9:19 AM 12/02/2022    Service time is from 0830 to 0919.

## 2022-12-02 NOTE — Plan of Care (Signed)
  Problem: Education: Goal: Understanding of CV disease, CV risk reduction, and recovery process will improve Outcome: Progressing Goal: Individualized Educational Video(s) Outcome: Progressing   Problem: Activity: Goal: Ability to return to baseline activity level will improve Outcome: Progressing   Problem: Cardiovascular: Goal: Ability to achieve and maintain adequate cardiovascular perfusion will improve Outcome: Progressing Goal: Vascular access site(s) Level 0-1 will be maintained Outcome: Progressing   Problem: Health Behavior/Discharge Planning: Goal: Ability to safely manage health-related needs after discharge will improve Outcome: Progressing   Problem: Education: Goal: Ability to describe self-care measures that may prevent or decrease complications (Diabetes Survival Skills Education) will improve Outcome: Progressing Goal: Individualized Educational Video(s) Outcome: Progressing   Problem: Coping: Goal: Ability to adjust to condition or change in health will improve Outcome: Progressing   Problem: Fluid Volume: Goal: Ability to maintain a balanced intake and output will improve Outcome: Progressing   Problem: Health Behavior/Discharge Planning: Goal: Ability to identify and utilize available resources and services will improve Outcome: Progressing Goal: Ability to manage health-related needs will improve Outcome: Progressing   Problem: Metabolic: Goal: Ability to maintain appropriate glucose levels will improve Outcome: Progressing   Problem: Nutritional: Goal: Maintenance of adequate nutrition will improve Outcome: Progressing Goal: Progress toward achieving an optimal weight will improve Outcome: Progressing   Problem: Skin Integrity: Goal: Risk for impaired skin integrity will decrease Outcome: Progressing   Problem: Tissue Perfusion: Goal: Adequacy of tissue perfusion will improve Outcome: Progressing   Problem: Education: Goal: Knowledge  of General Education information will improve Description: Including pain rating scale, medication(s)/side effects and non-pharmacologic comfort measures Outcome: Progressing   Problem: Health Behavior/Discharge Planning: Goal: Ability to manage health-related needs will improve Outcome: Progressing   Problem: Clinical Measurements: Goal: Ability to maintain clinical measurements within normal limits will improve Outcome: Progressing Goal: Will remain free from infection Outcome: Progressing Goal: Diagnostic test results will improve Outcome: Progressing Goal: Respiratory complications will improve Outcome: Progressing Goal: Cardiovascular complication will be avoided Outcome: Progressing   Problem: Activity: Goal: Risk for activity intolerance will decrease Outcome: Progressing   Problem: Nutrition: Goal: Adequate nutrition will be maintained Outcome: Progressing   Problem: Coping: Goal: Level of anxiety will decrease Outcome: Progressing   Problem: Elimination: Goal: Will not experience complications related to bowel motility Outcome: Progressing Goal: Will not experience complications related to urinary retention Outcome: Progressing   Problem: Pain Managment: Goal: General experience of comfort will improve Outcome: Progressing   Problem: Safety: Goal: Ability to remain free from injury will improve Outcome: Progressing   Problem: Skin Integrity: Goal: Risk for impaired skin integrity will decrease Outcome: Progressing   Problem: Education: Goal: Understanding of CV disease, CV risk reduction, and recovery process will improve Outcome: Progressing Goal: Individualized Educational Video(s) Outcome: Progressing   Problem: Activity: Goal: Ability to return to baseline activity level will improve Outcome: Progressing   Problem: Cardiovascular: Goal: Ability to achieve and maintain adequate cardiovascular perfusion will improve Outcome: Progressing Goal:  Vascular access site(s) Level 0-1 will be maintained Outcome: Progressing   Problem: Health Behavior/Discharge Planning: Goal: Ability to safely manage health-related needs after discharge will improve Outcome: Progressing

## 2022-12-04 ENCOUNTER — Telehealth (HOSPITAL_COMMUNITY): Payer: Self-pay

## 2022-12-04 ENCOUNTER — Encounter (HOSPITAL_COMMUNITY): Payer: Self-pay | Admitting: Cardiology

## 2022-12-04 MED FILL — Verapamil HCl IV Soln 2.5 MG/ML: INTRAVENOUS | Qty: 2 | Status: AC

## 2022-12-04 NOTE — Telephone Encounter (Signed)
Per Phase 1 Cardiac Rehab fax referral to [facility].

## 2022-12-05 ENCOUNTER — Ambulatory Visit (INDEPENDENT_AMBULATORY_CARE_PROVIDER_SITE_OTHER): Payer: Medicare HMO

## 2022-12-05 VITALS — BP 121/63 | HR 60 | Temp 97.5°F | Resp 16 | Ht 67.5 in | Wt 168.2 lb

## 2022-12-05 DIAGNOSIS — D509 Iron deficiency anemia, unspecified: Secondary | ICD-10-CM | POA: Diagnosis not present

## 2022-12-05 DIAGNOSIS — D500 Iron deficiency anemia secondary to blood loss (chronic): Secondary | ICD-10-CM

## 2022-12-05 MED ORDER — SODIUM CHLORIDE 0.9 % IV SOLN
200.0000 mg | Freq: Once | INTRAVENOUS | Status: AC
  Administered 2022-12-05: 200 mg via INTRAVENOUS
  Filled 2022-12-05: qty 10

## 2022-12-05 MED ORDER — DIPHENHYDRAMINE HCL 25 MG PO CAPS
25.0000 mg | ORAL_CAPSULE | Freq: Once | ORAL | Status: AC
  Administered 2022-12-05: 25 mg via ORAL
  Filled 2022-12-05: qty 1

## 2022-12-05 MED ORDER — ACETAMINOPHEN 325 MG PO TABS
650.0000 mg | ORAL_TABLET | Freq: Once | ORAL | Status: AC
  Administered 2022-12-05: 650 mg via ORAL
  Filled 2022-12-05: qty 2

## 2022-12-05 NOTE — Progress Notes (Signed)
Diagnosis: Iron Deficiency Anemia  Provider:  Chilton Greathouse MD  Procedure: IV Infusion  IV Type: Peripheral, IV Location: L Forearm  Venofer (Iron Sucrose), Dose: 200 mg  Infusion Start Time: 1117  Infusion Stop Time: 1133  Post Infusion IV Care: Patient declined observation and Peripheral IV Discontinued  Discharge: Condition: Good, Destination: Home . AVS Declined  Performed by:  Wyvonne Lenz, RN

## 2022-12-07 ENCOUNTER — Ambulatory Visit: Payer: Medicare HMO | Admitting: Internal Medicine

## 2022-12-07 ENCOUNTER — Encounter: Payer: Self-pay | Admitting: Internal Medicine

## 2022-12-07 VITALS — BP 122/62 | HR 68 | Ht 67.0 in | Wt 170.0 lb

## 2022-12-07 DIAGNOSIS — N178 Other acute kidney failure: Secondary | ICD-10-CM | POA: Diagnosis not present

## 2022-12-07 DIAGNOSIS — N138 Other obstructive and reflux uropathy: Secondary | ICD-10-CM | POA: Diagnosis not present

## 2022-12-07 DIAGNOSIS — Z7901 Long term (current) use of anticoagulants: Secondary | ICD-10-CM

## 2022-12-07 DIAGNOSIS — D72829 Elevated white blood cell count, unspecified: Secondary | ICD-10-CM | POA: Diagnosis not present

## 2022-12-07 DIAGNOSIS — R531 Weakness: Secondary | ICD-10-CM | POA: Diagnosis not present

## 2022-12-07 DIAGNOSIS — I480 Paroxysmal atrial fibrillation: Secondary | ICD-10-CM | POA: Diagnosis not present

## 2022-12-07 DIAGNOSIS — E119 Type 2 diabetes mellitus without complications: Secondary | ICD-10-CM | POA: Diagnosis not present

## 2022-12-07 DIAGNOSIS — I2510 Atherosclerotic heart disease of native coronary artery without angina pectoris: Secondary | ICD-10-CM | POA: Diagnosis not present

## 2022-12-07 DIAGNOSIS — D509 Iron deficiency anemia, unspecified: Secondary | ICD-10-CM

## 2022-12-07 DIAGNOSIS — K5730 Diverticulosis of large intestine without perforation or abscess without bleeding: Secondary | ICD-10-CM | POA: Diagnosis not present

## 2022-12-07 DIAGNOSIS — Z951 Presence of aortocoronary bypass graft: Secondary | ICD-10-CM | POA: Diagnosis not present

## 2022-12-07 DIAGNOSIS — R310 Gross hematuria: Secondary | ICD-10-CM | POA: Diagnosis not present

## 2022-12-07 DIAGNOSIS — I10 Essential (primary) hypertension: Secondary | ICD-10-CM | POA: Diagnosis not present

## 2022-12-07 DIAGNOSIS — N401 Enlarged prostate with lower urinary tract symptoms: Secondary | ICD-10-CM | POA: Diagnosis not present

## 2022-12-07 DIAGNOSIS — C9110 Chronic lymphocytic leukemia of B-cell type not having achieved remission: Secondary | ICD-10-CM | POA: Diagnosis not present

## 2022-12-07 DIAGNOSIS — R338 Other retention of urine: Secondary | ICD-10-CM | POA: Diagnosis not present

## 2022-12-07 DIAGNOSIS — Z950 Presence of cardiac pacemaker: Secondary | ICD-10-CM | POA: Diagnosis not present

## 2022-12-07 DIAGNOSIS — N179 Acute kidney failure, unspecified: Secondary | ICD-10-CM | POA: Diagnosis not present

## 2022-12-07 DIAGNOSIS — R339 Retention of urine, unspecified: Secondary | ICD-10-CM | POA: Diagnosis not present

## 2022-12-07 DIAGNOSIS — I119 Hypertensive heart disease without heart failure: Secondary | ICD-10-CM | POA: Diagnosis not present

## 2022-12-07 DIAGNOSIS — N3289 Other specified disorders of bladder: Secondary | ICD-10-CM | POA: Diagnosis not present

## 2022-12-07 DIAGNOSIS — Z8679 Personal history of other diseases of the circulatory system: Secondary | ICD-10-CM | POA: Diagnosis not present

## 2022-12-07 DIAGNOSIS — T8383XA Hemorrhage of genitourinary prosthetic devices, implants and grafts, initial encounter: Secondary | ICD-10-CM | POA: Diagnosis not present

## 2022-12-07 DIAGNOSIS — N1330 Unspecified hydronephrosis: Secondary | ICD-10-CM | POA: Diagnosis not present

## 2022-12-07 DIAGNOSIS — I214 Non-ST elevation (NSTEMI) myocardial infarction: Secondary | ICD-10-CM | POA: Diagnosis not present

## 2022-12-07 MED ORDER — PANTOPRAZOLE SODIUM 40 MG PO TBEC: 40.0000 mg | DELAYED_RELEASE_TABLET | Freq: Every day | ORAL | 3 refills | Status: DC

## 2022-12-07 NOTE — Patient Instructions (Signed)
We have sent the following medications to your pharmacy for you to pick up at your convenience:  Pantoprazole.   You have been scheduled for a CT Virtual Colonoscopy on 01/15/2023 at 9:00am at [facility]. They are located at [address]  Please arrive 20 minutes prior to your appointment. You will need to go by their office at least 4 days prior to the day of your appointment to pick up contrast for the procedure. If you need to contact [facility] their number is [phone].   A Virtual colonoscopy is a medical imaging procedure which uses x-rays and computers to produce two- and three-dimensional images of the colon from the lowest part, the rectum, all the way to the lower end of the small intestine and display them on a screen. A Virtual colonoscopy can show ulcers, polyps and cancer.   _______________________________________________________  If your blood pressure at your visit was 140/90 or greater, please contact your primary care physician to follow up on this.  _______________________________________________________  If you are age 84 or older, your body mass index should be between 23-30. Your Body mass index is 26.63 kg/m. If this is out of the aforementioned range listed, please consider follow up with your Primary Care Provider.  If you are age 8 or younger, your body mass index should be between 19-25. Your Body mass index is 26.63 kg/m. If this is out of the aformentioned range listed, please consider follow up with your Primary Care Provider.   ________________________________________________________  The [facility] providers would like to encourage you to use [facility] to communicate with providers for non-urgent requests or questions.  Due to long hold times on the telephone, sending your provider a message by [facility] may be a faster and more efficient way to get a response.  Please allow 48 business hours for a response.  Please remember that this is  for non-urgent requests.  _______________________________________________________

## 2022-12-07 NOTE — Progress Notes (Signed)
HISTORY OF PRESENT ILLNESS:  Robert Burns Robert Burns is a 82 y.o. male with MULTIPLE significant medical problems including hypertension, hyperlipidemia, obstructive sleep apnea, peripheral vascular disease, sick sinus syndrome status post pacemaker placement, diabetes mellitus, CLL and coronary artery disease with prior CABG.  He was hospitalized last week with a NSTEMI.  He is on both chronic Plavix and Xarelto therapies.  He is sent today by his hematologist/oncologist regarding iron deficiency anemia.  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  Now with progressive microcytic anemia consistent with iron deficiency.  No overt bleeding.  He is new to this office.  He is accompanied by his daughter Alvis Lemmings.  He thinks that he has had colonoscopy in [place].  No records.  These have been requested.  Tells me that he has received 3 iron infusions this month.  Most recently, 3 days ago.  He is scheduled for 2 additional infusions.  He is not on PPI.  He denies melena, hematochezia, or weight loss.  Does have chronic diarrhea which has been ascribed to his metformin.  His hospitalization and outpatient evaluations including cardiology and oncology reviewed.  Hemoglobin December 02 2022 was 8.5 with MCV 77.2.  REVIEW OF SYSTEMS:  All non-GI ROS negative unless otherwise stated in the HPI except for allergies, anxiety, back pain, cough, fatigue, headaches, urinary frequency, urinary leakage  Past Medical History:  Diagnosis Date   Anemia    Anxiety    Arthritis    "all over" (11/25/2015)   CAD (coronary artery disease)    a. s/p CABG  06/2002; b. 02/01/15 PCI: DES to prox SVG to PDA, staged PCI of SVG to Diag in 02/2015; c. 04/2017 Cath/PCI: LM nl, LAD 100ost, 54m, 75d, LCX 60ost, OM2 80, RCA 100ost, RPDA 80, LIMA->LAD ok, VG->D1 patent stent, VG->RPDA patent stent, 50p, VG->OM1->OM2 90p (3.0x24 Synergy DES), 100 between OM1->OM2 (med rx).   Cancer [facility])    Right Shoulder, Left Leg- BCC, SCC, AND MELANOMA    Colon polyp    Elevated cholesterol    Epithelioid hemangioendothelioma    s/p resection of left SFA/mass with interposition 6 mm GoreTex graft 06/02/10, s/p repeat resection for positive margins 09/22/10   High blood pressure    Hyperlipidemia    Hypertension    Leg pain    OSA on CPAP    PAD (peripheral artery disease) (HCC)    a. 10/2002 L SFA PTA/BMS; b. 8/17 LE Angio: LEIA 90 (9x40 self exp stent), LSFA short segment prox occlusion (staged PTA/stenting 01/03/2016), patent mid stent, RSFA 67m (staged PTA/DEB 02/14/2016).   Presence of permanent cardiac pacemaker    Medtronic   SSS (sick sinus syndrome) (HCC)    a. s/p PPM in 2007 with gen change 04/2015 - Medtronic Adapta ADDRL1, ser # [id number].   Type II diabetes mellitus (HCC)    Type II   Urgency of urination     Past Surgical History:  Procedure Laterality Date   CARDIAC CATHETERIZATION N/A 02/01/2015   Procedure: Left Heart Cath and Coronary Angiography;  Surgeon: Runell Gess, MD;  Location: [facility] INVASIVE CV LAB;  Service: Cardiovascular;  Laterality: N/A;   CARDIAC CATHETERIZATION N/A 02/01/2015   Procedure: Coronary Stent Intervention;  Surgeon: Runell Gess, MD;  Location: [facility] INVASIVE CV LAB;  Service: Cardiovascular;  Laterality: N/A;   CARDIAC CATHETERIZATION  06/2002   "just before bypass OR"   CARDIAC CATHETERIZATION N/A 03/01/2015   Procedure: Coronary Stent Intervention;  Surgeon: Runell Gess, MD;  Location:  [facility] INVASIVE CV LAB;  Service: Cardiovascular;  Laterality: N/A;   CARDIAC CATHETERIZATION  02/06/2018   COLONOSCOPY     CORONARY ANGIOPLASTY     CORONARY ARTERY BYPASS GRAFT  06/2002   x5, LIMA-LAD;VG- Diag; seq VG- ramus & OM branch; VG-PDA   CORONARY STENT INTERVENTION N/A 04/12/2017   Procedure: CORONARY STENT INTERVENTION;  Surgeon: Runell Gess, MD;  Location: [facility] INVASIVE CV LAB;  Service: Cardiovascular;  Laterality: N/A;   CORONARY STENT INTERVENTION N/A 02/08/2018   Procedure: CORONARY  STENT INTERVENTION;  Surgeon: Corky Crafts, MD;  Location: [facility] INVASIVE CV LAB;  Service: Cardiovascular;  Laterality: N/A;   CORONARY STENT INTERVENTION N/A 11/12/2018   Procedure: CORONARY STENT INTERVENTION;  Surgeon: Iran Ouch, MD;  Location: [facility] INVASIVE CV LAB;  Service: Cardiovascular;  Laterality: N/A;   CORONARY STENT INTERVENTION N/A 06/22/2020   Procedure: CORONARY STENT INTERVENTION;  Surgeon: Corky Crafts, MD;  Location: [facility] INVASIVE CV LAB;  Service: Cardiovascular;  Laterality: N/A;   EP IMPLANTABLE DEVICE N/A 04/23/2015   Procedure: PPM Generator Changeout;  Surgeon: Thurmon Fair, MD;  Location: [facility] INVASIVE CV LAB;  Service: Cardiovascular;  Laterality: N/A;   FALSE ANEURYSM REPAIR Left 11/29/2018   Procedure: REPAIR FALSE ANEURYSM LEFT RADIAL ARTERY;  Surgeon: Larina Earthly, MD;  Location: [facility] OR;  Service: Vascular;  Laterality: Left;   FEMORAL ARTERY STENT Left ~ 2014   "taken out of my leg; couldn' catorgorize what kind so the put it under all 3"; cataroziepitheloid hemanioendotheliomau   FOOT FRACTURE SURGERY Left 1973   FRACTURE SURGERY     INSERT / REPLACE / REMOVE PACEMAKER  10/13/05   right side, medtronic Adapta   KNEE HARDWARE REMOVAL Right 1950's   "3-4 months after the insertion"   KNEE SURGERY Right 1950's   "broke my lower leg; had to put pin in my knee to keep lower leg in place til it healed"   LEFT HEART CATH AND CORS/GRAFTS ANGIOGRAPHY N/A 04/12/2017   Procedure: LEFT HEART CATH AND CORS/GRAFTS ANGIOGRAPHY;  Surgeon: Runell Gess, MD;  Location: [facility] INVASIVE CV LAB;  Service: Cardiovascular;  Laterality: N/A;   LEFT HEART CATH AND CORS/GRAFTS ANGIOGRAPHY N/A 02/06/2018   Procedure: LEFT HEART CATH AND CORS/GRAFTS ANGIOGRAPHY;  Surgeon: Lennette Bihari, MD;  Location: [facility] INVASIVE CV LAB;  Service: Cardiovascular;  Laterality: N/A;   LEFT HEART CATH AND CORS/GRAFTS ANGIOGRAPHY N/A 11/12/2018   Procedure: LEFT HEART CATH AND CORS/GRAFTS  ANGIOGRAPHY;  Surgeon: Iran Ouch, MD;  Location: [facility] INVASIVE CV LAB;  Service: Cardiovascular;  Laterality: N/A;   LEFT HEART CATH AND CORS/GRAFTS ANGIOGRAPHY N/A 06/22/2020   Procedure: LEFT HEART CATH AND CORS/GRAFTS ANGIOGRAPHY;  Surgeon: Corky Crafts, MD;  Location: [facility] INVASIVE CV LAB;  Service: Cardiovascular;  Laterality: N/A;   LEFT HEART CATH AND CORS/GRAFTS ANGIOGRAPHY N/A 12/01/2022   Procedure: LEFT HEART CATH AND CORS/GRAFTS ANGIOGRAPHY;  Surgeon: Swazil[place], Peter M, MD;  Location: [facility] INVASIVE CV LAB;  Service: Cardiovascular;  Laterality: N/A;   PERIPHERAL VASCULAR CATHETERIZATION N/A 11/25/2015   Procedure: Lower Extremity Angiography;  Surgeon: Runell Gess, MD;  Location: [facility] INVASIVE CV LAB;  Service: Cardiovascular;  Laterality: N/A;   PERIPHERAL VASCULAR CATHETERIZATION Left 11/25/2015   Procedure: Peripheral Vascular Intervention;  Surgeon: Runell Gess, MD;  Location: [facility] INVASIVE CV LAB;  Service: Cardiovascular;  Laterality: Left;  external iliac   PERIPHERAL VASCULAR CATHETERIZATION N/A 01/03/2016   Procedure: Lower Extremity Angiography;  Surgeon: Christiane Ha  Erlene Quan, MD;  Location: [facility] INVASIVE CV LAB;  Service: Cardiovascular;  Laterality: N/A;   PERIPHERAL VASCULAR CATHETERIZATION Left 01/03/2016   Procedure: Peripheral Vascular Intervention;  Surgeon: Runell Gess, MD;  Location: [facility] INVASIVE CV LAB;  Service: Cardiovascular;  Laterality: Left;  SFA   PERIPHERAL VASCULAR CATHETERIZATION Right 02/14/2016   Procedure: Peripheral Vascular Atherectomy;  Surgeon: Runell Gess, MD;  Location: [facility] INVASIVE CV LAB;  Service: Cardiovascular;  Laterality: Right;  SFA   POPLITEAL ARTERY STENT  01/03/2016   Contralateral access with a 7 [name] crossover sheath (second order catheter placement)   TONSILLECTOMY AND ADENOIDECTOMY     TUMOR EXCISION Right ~ 2005   cancerous tumor removed from shoulder   TUMOR EXCISION Right ~ 2000   benign tumor removed from under  shoulder   TUMOR EXCISION Left 06/02/2010   resection of Lt SFA wth interposition of Gore-Tex graft    Social History Angelito Verdejo Boehne  reports that he quit smoking about 51 years ago. His smoking use included pipe. He has never used smokeless tobacco. He reports that he does not drink alcohol and does not use drugs.  family history includes Coronary artery disease in his mother; Diabetes in his father and sister; Heart attack in his father and mother; Heart disease in his father, mother, and sister; Hyperlipidemia in his father; Hypertension in his mother and sister.  Allergies  Allergen Reactions   Peanut-Containing Drug Products Anaphylaxis and Other (See Comments)    Tongue swelling is severe    Ace Inhibitors Cough        Diltiazem Hcl Other (See Comments)    UNSPECIFIED REACTION     Meloxicam Other (See Comments)    GI upset    Doxycycline Other (See Comments) and Cough    Reaction of cough and runny nose   Eliquis [Apixaban] Other (See Comments)    dizziness   Sertraline Hcl Other (See Comments)   Carvedilol Itching   Clonidine Hcl Other (See Comments)    Patch only - skin irritation   Duloxetine Hcl Other (See Comments)    Urinary frequency       PHYSICAL EXAMINATION: Vital signs: BP 122/62   Pulse 68   Ht 5\' 7"  (1.702 m)   Wt 170 lb (77.1 kg)   BMI 26.63 kg/m   Constitutional: Elderly but generally well-appearing, no acute distress Psychiatric: alert and oriented x3, cooperative Eyes: extraocular movements intact, anicteric, conjunctiva pink Mouth: oral pharynx moist, no lesions Neck: supple no lymphadenopathy Cardiovascular: heart regular rate and rhythm, no murmur Lungs: clear to auscultation bilaterally Abdomen: soft, nontender, nondistended, no obvious ascites, no peritoneal signs, normal bowel sounds, no organomegaly Rectal: Omitted Extremities: no clubbing, cyanosis, or lower extremity edema bilaterally Skin: no lesions on visible  extremities Neuro: No focal deficits.  Cranial nerves intact  ASSESSMENT:  1.  Iron deficiency anemia.  No overt bleeding.  Suspect chronic GI blood loss in a patient on both Plavix and Xarelto.  Possibilities include upper GI mucosal lesions (ulcers, erosions), AVMs, neoplasia.  In addition, nonspecific mucosal oozing, in patients on these agents, is not unusual. 2.  MULTIPLE significant medical problems as outlined, including very recent myocardial infarction 3.  Outside GI records elsewhere.  Being requested for review   PLAN:  1.  Iron replacement therapy and monitoring of blood counts with Dr. Leonides Schanz.  Transfuse if clinically indicated.  I would expect him to respond to iron replacement therapy, assuming his bone marrow function is  adequate. 2.  Prescribed pantoprazole 40 mg daily.  He should take this indefinitely for upper GI mucosal protection. 3.  Virtual colonoscopy to rule out significant colonic pathology.  As the patient is not a candidate for optical colonoscopy, given his comorbidities and recent myocardial infarction. 4.  Review of outside records obtained 5.  Ongoing general medical care with PCP A total time of 65 minutes was spent preparing to see the patient, reviewing the myriad of records and data, obtaining comprehensive history, performing medically appropriate physical examination, counseling and educating the patient and his daughter regarding the above listed issues, ordering medication, ordering advanced radiology study, requesting outside records for review, and documenting clinical information in the health record.

## 2022-12-08 DIAGNOSIS — R338 Other retention of urine: Secondary | ICD-10-CM | POA: Diagnosis not present

## 2022-12-08 DIAGNOSIS — Z8679 Personal history of other diseases of the circulatory system: Secondary | ICD-10-CM | POA: Diagnosis not present

## 2022-12-08 DIAGNOSIS — C9110 Chronic lymphocytic leukemia of B-cell type not having achieved remission: Secondary | ICD-10-CM | POA: Diagnosis not present

## 2022-12-08 DIAGNOSIS — I480 Paroxysmal atrial fibrillation: Secondary | ICD-10-CM | POA: Diagnosis not present

## 2022-12-08 DIAGNOSIS — N179 Acute kidney failure, unspecified: Secondary | ICD-10-CM | POA: Diagnosis not present

## 2022-12-08 DIAGNOSIS — I10 Essential (primary) hypertension: Secondary | ICD-10-CM | POA: Diagnosis not present

## 2022-12-08 DIAGNOSIS — E119 Type 2 diabetes mellitus without complications: Secondary | ICD-10-CM | POA: Diagnosis not present

## 2022-12-09 DIAGNOSIS — C9110 Chronic lymphocytic leukemia of B-cell type not having achieved remission: Secondary | ICD-10-CM | POA: Diagnosis not present

## 2022-12-09 DIAGNOSIS — Z8679 Personal history of other diseases of the circulatory system: Secondary | ICD-10-CM | POA: Diagnosis not present

## 2022-12-09 DIAGNOSIS — I10 Essential (primary) hypertension: Secondary | ICD-10-CM | POA: Diagnosis not present

## 2022-12-09 DIAGNOSIS — R338 Other retention of urine: Secondary | ICD-10-CM | POA: Diagnosis not present

## 2022-12-09 DIAGNOSIS — N179 Acute kidney failure, unspecified: Secondary | ICD-10-CM | POA: Diagnosis not present

## 2022-12-09 DIAGNOSIS — I480 Paroxysmal atrial fibrillation: Secondary | ICD-10-CM | POA: Diagnosis not present

## 2022-12-09 DIAGNOSIS — E119 Type 2 diabetes mellitus without complications: Secondary | ICD-10-CM | POA: Diagnosis not present

## 2022-12-10 DIAGNOSIS — R338 Other retention of urine: Secondary | ICD-10-CM | POA: Diagnosis not present

## 2022-12-10 DIAGNOSIS — I480 Paroxysmal atrial fibrillation: Secondary | ICD-10-CM | POA: Diagnosis not present

## 2022-12-10 DIAGNOSIS — Z8679 Personal history of other diseases of the circulatory system: Secondary | ICD-10-CM | POA: Diagnosis not present

## 2022-12-10 DIAGNOSIS — I10 Essential (primary) hypertension: Secondary | ICD-10-CM | POA: Diagnosis not present

## 2022-12-10 DIAGNOSIS — E119 Type 2 diabetes mellitus without complications: Secondary | ICD-10-CM | POA: Diagnosis not present

## 2022-12-10 DIAGNOSIS — C9110 Chronic lymphocytic leukemia of B-cell type not having achieved remission: Secondary | ICD-10-CM | POA: Diagnosis not present

## 2022-12-10 DIAGNOSIS — N179 Acute kidney failure, unspecified: Secondary | ICD-10-CM | POA: Diagnosis not present

## 2022-12-11 DIAGNOSIS — I480 Paroxysmal atrial fibrillation: Secondary | ICD-10-CM | POA: Diagnosis not present

## 2022-12-11 DIAGNOSIS — Z8679 Personal history of other diseases of the circulatory system: Secondary | ICD-10-CM | POA: Diagnosis not present

## 2022-12-11 DIAGNOSIS — E119 Type 2 diabetes mellitus without complications: Secondary | ICD-10-CM | POA: Diagnosis not present

## 2022-12-11 DIAGNOSIS — C9110 Chronic lymphocytic leukemia of B-cell type not having achieved remission: Secondary | ICD-10-CM | POA: Diagnosis not present

## 2022-12-11 DIAGNOSIS — R338 Other retention of urine: Secondary | ICD-10-CM | POA: Diagnosis not present

## 2022-12-11 DIAGNOSIS — N179 Acute kidney failure, unspecified: Secondary | ICD-10-CM | POA: Diagnosis not present

## 2022-12-11 DIAGNOSIS — I10 Essential (primary) hypertension: Secondary | ICD-10-CM | POA: Diagnosis not present

## 2022-12-11 NOTE — Progress Notes (Unsigned)
Cardiology Clinic Note   Patient Name: Robert Burns Date of Encounter: 12/13/2022  Primary Care Provider:  Thana Ates, MD Primary Cardiologist:  Nanetta Batty, MD  Patient Profile    82 year old male with history of hypertension, coronary artery disease status post CABG with multiple PCI's, PAF, sick sinus syndrome status post pacemaker in situ, hyperlipidemia, CLL and BPH.  Recent admission on 11/30/2022 in the setting of recurrent chest pain.  He was diagnosed with NSTEMI and cardiology was consulted.  Xarelto dose was decreased to 15 mg daily as he was found to be in sinus rhythm.  Cardiac catheterization was completed by Dr. Swaziland on 12/01/2022 revealing severe three-vessel obstructive CAD with patent LIMA to LAD, known occlusion of SVG to circumflex, patent SVG to diagonal with multiple prior stents.  Within proximal stent there is a focal 70% stenosis but other stented segments were open.  SVG to RCA could not be salvaged.  It was recommended that he continue on continued medical therapy but if refractory angina could consider balloon angioplasty of in-stent disease of the SVG to diagonal but long-term patency would be limited.  Amlodipine and HCTZ were discontinued but Imdur 90 mg daily metoprolol 100 mg daily were continued.  Marcelline Deist and [place] were added as EF showed EF of 45 to 50%.  He was not placed on diuretics.   Past Medical History    Past Medical History:  Diagnosis Date   Anemia    Anxiety    Arthritis    "all over" (11/25/2015)   CAD (coronary artery disease)    a. s/p CABG  06/2002; b. 02/01/15 PCI: DES to prox SVG to PDA, staged PCI of SVG to Diag in 02/2015; c. 04/2017 Cath/PCI: LM nl, LAD 100ost, 39m, 75d, LCX 60ost, OM2 80, RCA 100ost, RPDA 80, LIMA->LAD ok, VG->D1 patent stent, VG->RPDA patent stent, 50p, VG->OM1->OM2 90p (3.0x24 Synergy DES), 100 between OM1->OM2 (med rx).   Cancer [facility])    Right Shoulder, Left Leg- BCC, SCC, AND MELANOMA   Colon polyp     Elevated cholesterol    Epithelioid hemangioendothelioma    s/p resection of left SFA/mass with interposition 6 mm GoreTex graft 06/02/10, s/p repeat resection for positive margins 09/22/10   High blood pressure    Hyperlipidemia    Hypertension    Leg pain    OSA on CPAP    PAD (peripheral artery disease) (HCC)    a. 10/2002 L SFA PTA/BMS; b. 8/17 LE Angio: LEIA 90 (9x40 self exp stent), LSFA short segment prox occlusion (staged PTA/stenting 01/03/2016), patent mid stent, RSFA 58m (staged PTA/DEB 02/14/2016).   Presence of permanent cardiac pacemaker    Medtronic   SSS (sick sinus syndrome) (HCC)    a. s/p PPM in 2007 with gen change 04/2015 - Medtronic Adapta ADDRL1, ser # [id number].   Type II diabetes mellitus (HCC)    Type II   Urgency of urination    Past Surgical History:  Procedure Laterality Date   CARDIAC CATHETERIZATION N/A 02/01/2015   Procedure: Left Heart Cath and Coronary Angiography;  Surgeon: Runell Gess, MD;  Location: [facility] INVASIVE CV LAB;  Service: Cardiovascular;  Laterality: N/A;   CARDIAC CATHETERIZATION N/A 02/01/2015   Procedure: Coronary Stent Intervention;  Surgeon: Runell Gess, MD;  Location: [facility] INVASIVE CV LAB;  Service: Cardiovascular;  Laterality: N/A;   CARDIAC CATHETERIZATION  06/2002   "just before bypass OR"   CARDIAC CATHETERIZATION N/A 03/01/2015   Procedure: Coronary Stent Intervention;  Surgeon: Runell Gess, MD;  Location: [facility] INVASIVE CV LAB;  Service: Cardiovascular;  Laterality: N/A;   CARDIAC CATHETERIZATION  02/06/2018   COLONOSCOPY     CORONARY ANGIOPLASTY     CORONARY ARTERY BYPASS GRAFT  06/2002   x5, LIMA-LAD;VG- Diag; seq VG- ramus & OM branch; VG-PDA   CORONARY STENT INTERVENTION N/A 04/12/2017   Procedure: CORONARY STENT INTERVENTION;  Surgeon: Runell Gess, MD;  Location: [facility] INVASIVE CV LAB;  Service: Cardiovascular;  Laterality: N/A;   CORONARY STENT INTERVENTION N/A 02/08/2018   Procedure: CORONARY STENT INTERVENTION;   Surgeon: Corky Crafts, MD;  Location: [facility] INVASIVE CV LAB;  Service: Cardiovascular;  Laterality: N/A;   CORONARY STENT INTERVENTION N/A 11/12/2018   Procedure: CORONARY STENT INTERVENTION;  Surgeon: Iran Ouch, MD;  Location: [facility] INVASIVE CV LAB;  Service: Cardiovascular;  Laterality: N/A;   CORONARY STENT INTERVENTION N/A 06/22/2020   Procedure: CORONARY STENT INTERVENTION;  Surgeon: Corky Crafts, MD;  Location: [facility] INVASIVE CV LAB;  Service: Cardiovascular;  Laterality: N/A;   EP IMPLANTABLE DEVICE N/A 04/23/2015   Procedure: PPM Generator Changeout;  Surgeon: Thurmon Fair, MD;  Location: [facility] INVASIVE CV LAB;  Service: Cardiovascular;  Laterality: N/A;   FALSE ANEURYSM REPAIR Left 11/29/2018   Procedure: REPAIR FALSE ANEURYSM LEFT RADIAL ARTERY;  Surgeon: Larina Earthly, MD;  Location: [facility] OR;  Service: Vascular;  Laterality: Left;   FEMORAL ARTERY STENT Left ~ 2014   "taken out of my leg; couldn' catorgorize what kind so the put it under all 3"; cataroziepitheloid hemanioendotheliomau   FOOT FRACTURE SURGERY Left 1973   FRACTURE SURGERY     INSERT / REPLACE / REMOVE PACEMAKER  10/13/05   right side, medtronic Adapta   KNEE HARDWARE REMOVAL Right 1950's   "3-4 months after the insertion"   KNEE SURGERY Right 1950's   "broke my lower leg; had to put pin in my knee to keep lower leg in place til it healed"   LEFT HEART CATH AND CORS/GRAFTS ANGIOGRAPHY N/A 04/12/2017   Procedure: LEFT HEART CATH AND CORS/GRAFTS ANGIOGRAPHY;  Surgeon: Runell Gess, MD;  Location: [facility] INVASIVE CV LAB;  Service: Cardiovascular;  Laterality: N/A;   LEFT HEART CATH AND CORS/GRAFTS ANGIOGRAPHY N/A 02/06/2018   Procedure: LEFT HEART CATH AND CORS/GRAFTS ANGIOGRAPHY;  Surgeon: Lennette Bihari, MD;  Location: [facility] INVASIVE CV LAB;  Service: Cardiovascular;  Laterality: N/A;   LEFT HEART CATH AND CORS/GRAFTS ANGIOGRAPHY N/A 11/12/2018   Procedure: LEFT HEART CATH AND CORS/GRAFTS ANGIOGRAPHY;  Surgeon: Iran Ouch, MD;  Location: [facility] INVASIVE CV LAB;  Service: Cardiovascular;  Laterality: N/A;   LEFT HEART CATH AND CORS/GRAFTS ANGIOGRAPHY N/A 06/22/2020   Procedure: LEFT HEART CATH AND CORS/GRAFTS ANGIOGRAPHY;  Surgeon: Corky Crafts, MD;  Location: [facility] INVASIVE CV LAB;  Service: Cardiovascular;  Laterality: N/A;   LEFT HEART CATH AND CORS/GRAFTS ANGIOGRAPHY N/A 12/01/2022   Procedure: LEFT HEART CATH AND CORS/GRAFTS ANGIOGRAPHY;  Surgeon: Swaziland, Peter M, MD;  Location: [facility] INVASIVE CV LAB;  Service: Cardiovascular;  Laterality: N/A;   PERIPHERAL VASCULAR CATHETERIZATION N/A 11/25/2015   Procedure: Lower Extremity Angiography;  Surgeon: Runell Gess, MD;  Location: [facility] INVASIVE CV LAB;  Service: Cardiovascular;  Laterality: N/A;   PERIPHERAL VASCULAR CATHETERIZATION Left 11/25/2015   Procedure: Peripheral Vascular Intervention;  Surgeon: Runell Gess, MD;  Location[facility] INVAS[facility] CV LAB;  Service: Cardiovascular;  Laterality: Left;  external iliac   PERIPHERAL VASCULAR CATHETERIZATION N/A 01/03/2016  Procedure: Lower Extremity Angiography;  Surgeon: Runell Gess, MD;  Location: [facility] INVASIVE CV LAB;  Service: Cardiovascular;  Laterality: N/A;   PERIPHERAL VASCULAR CATHETERIZATION Left 01/03/2016   Procedure: Peripheral Vascular Intervention;  Surgeon: Runell Gess, MD;  Location: [facility] INVASIVE CV LAB;  Service: Cardiovascular;  Laterality: Left;  SFA   PERIPHERAL VASCULAR CATHETERIZATION Right 02/14/2016   Procedure: Peripheral Vascular Atherectomy;  Surgeon: Runell Gess, MD;  Location: [facility] INVASIVE CV LAB;  Service: Cardiovascular;  Laterality: Right;  SFA   POPLITEAL ARTERY STENT  01/03/2016   Contralateral access with a 7 [name] crossover sheath (second order catheter placement)   TONSILLECTOMY AND ADENOIDECTOMY     TUMOR EXCISION Right ~ 2005   cancerous tumor removed from shoulder   TUMOR EXCISION Right ~ 2000   benign tumor removed from under shoulder   TUMOR EXCISION Left  06/02/2010   resection of Lt SFA wth interposition of Gore-Tex graft    Allergies  Allergies  Allergen Reactions   Peanut-Containing Drug Products Anaphylaxis and Other (See Comments)    Tongue swelling is severe    Ace Inhibitors Cough        Diltiazem Hcl Other (See Comments)    UNSPECIFIED REACTION     Meloxicam Other (See Comments)    GI upset    Doxycycline Other (See Comments) and Cough    Reaction of cough and runny nose   Eliquis [Apixaban] Other (See Comments)    dizziness   Sertraline Hcl Other (See Comments)   Carvedilol Itching   Clonidine Hcl Other (See Comments)    Patch only - skin irritation   Duloxetine Hcl Other (See Comments)    Urinary frequency    History of Present Illness    Mr. Nohl returns to the office today post hospitalization for him cardiac catheterization.  As described above the patient did not have any areas which could be targeted to increase his overall cardiovascular perfusion.  He will be continued to be treated medically.  Since discharge he has been seen at [facility] for inability to urinate. Had urinary catheter inserted due to enlarged prostate. He has had significant hematuria since that time. Hgb dropping from 8.5 to 8.2 on last labs   He has also complained of recurrent chest pain for which nitroglycerin has been helpful.  He did have 1 episode on his way to the ED for recent insertion of urinary catheter.  He is followed by urology at [facility] and continues to wear an external catheter leg bag.  There has been significant amount of hematuria in the bag.  Other than generalized fatigue, ongoing hematuria, and recurrent chest discomfort, the patient continues to be treated for CLL and also has iron infusions for chronic anemia.  He is accompanied by caregiver and his daughter is on the phone during this visit.  Home Medications    Current Outpatient Medications  Medication Sig Dispense Refill   atorvastatin (LIPITOR) 80 MG tablet  Take 1 tablet (80 mg total) by mouth daily at 6 PM. 90 tablet 1   Cholecalciferol (VITAMIN D) 50 MCG (2000 UT) CAPS Take by mouth.     empagliflozin (JARDIANCE) 10 MG TABS tablet Take 1 tablet (10 mg total) by mouth daily. 30 tablet 0   finasteride (PROSCAR) 5 MG tablet Take 5 mg by mouth daily.     glipiZIDE (GLUCOTROL) 5 MG tablet Take 5 mg by mouth daily with breakfast.      HYDROcodone-acetaminophen (NORCO/VICODIN) 5-325 MG tablet  Take 1 tablet by mouth every 6 (six) hours as needed for moderate pain.     isosorbide mononitrate (IMDUR) 60 MG 24 hr tablet Take 1.5 tablets (90 mg total) by mouth daily. 90 tablet 3   melatonin 5 MG TABS Take 5 mg by mouth at bedtime.     metFORMIN (GLUCOPHAGE) 1000 MG tablet Take 1 tablet (1,000 mg total) by mouth 2 (two) times daily with a meal. Restart on 3/18.     metoprolol tartrate (LOPRESSOR) 100 MG tablet Take 1 tablet (100 mg total) by mouth 2 (two) times daily. 180 tablet 3   MULTIPLE VITAMIN-FOLIC ACID PO Take 1 tablet by mouth daily.     nitroGLYCERIN (NITROSTAT) 0.4 MG SL tablet Place 1 tablet (0.4 mg total) under the tongue every 5 (five) minutes as needed for chest pain. 25 tablet 3   ranolazine (RANEXA) 1000 MG SR tablet Take 1 tablet (1,000 mg total) by mouth 2 (two) times daily. 60 tablet 5   Rivaroxaban (XARELTO) 15 MG TABS tablet Take 1 tablet (15 mg total) by mouth daily with supper. 30 tablet 0   sacubitril-valsartan (ENTRESTO) 49-51 MG Take 1 tablet by mouth 2 (two) times daily. 60 tablet 0   tamsulosin (FLOMAX) 0.4 MG CAPS capsule Take 0.4 mg by mouth 2 (two) times daily.      No current facility-administered medications for this visit.     Family History    Family History  Problem Relation Age of Onset   Coronary artery disease Mother    Heart attack Mother    Hypertension Mother    Heart disease Mother        Open  Heart surgery   Diabetes Father    Heart disease Father    Hyperlipidemia Father    Heart attack Father     Hypertension Sister    Diabetes Sister    Heart disease Sister    Liver disease Neg Hx    Esophageal cancer Neg Hx    Colon cancer Neg Hx    He indicated that his mother is deceased. He indicated that his father is deceased. He indicated that his sister is alive. He indicated that his maternal grandmother is deceased. He indicated that his maternal grandfather is deceased. He indicated that his paternal grandmother is deceased. He indicated that his paternal grandfather is deceased. He indicated that the status of his neg hx is unknown.  Social History    Social History   Socioeconomic History   Marital status: Married    Spouse name: Not on file   Number of children: 2   Years of education: Not on file   Highest education level: Not on file  Occupational History   Occupation: retired  Tobacco Use   Smoking status: Former    Types: Pipe    Quit date: 1973    Years since quitting: 51.7   Smokeless tobacco: Never   Tobacco comments:    "quit smoking in 1973"  Vaping Use   Vaping status: Never Used  Substance and Sexual Activity   Alcohol use: No   Drug use: No   Sexual activity: Not Currently  Other Topics Concern   Not on file  Social History Narrative   Not on file   Social Determinants of Health   Financial Resource Strain: Not on file  Food Insecurity: No Food Insecurity (12/07/2022)   [facility]   Hunger Vital Sign    Worried About Running Out of Food  in the Last Year: Never true    Ran Out of Food in the Last Year: Never true  Transportation Needs: No Transportation Needs (12/11/2022)   Received from [facility] - Transportation    Lack of Transportation (Medical): No    Lack of Transportation (Non-Medical): No  Physical Activity: Not on file  Stress: No Stress Concern Present (12/07/2022)   Received from [facility] - Occupational Stress Questionnaire    Feeling of Stress : Not at all   Social Connections: Unknown (08/18/2021)   Received from [facility]   Social Network    Social Network: Not on file  Intimate Partner Violence: Not At Risk (12/07/2022)   Received from [facility]   HITS    Over the last 12 months how often did your partner physically hurt you?: 1    Over the last 12 months how often did your partner insult you or talk down to you?: 1    Over the last 12 months how often did your partner threaten you with physical harm?: 1    Over the last 12 months how often did your partner scream or curse at you?: 1     Review of Systems    General:  No chills, fever, night sweats or weight changes.  Cardiovascular: Positive for chest pain requiring nitroglycerin, , dyspnea on exertion, edema, orthopnea, palpitations, paroxysmal nocturnal dyspnea. Dermatological: No rash, lesions/masses Respiratory: No cough, dyspnea Urologic: Positive for  hematuria, dysuria Abdominal:   No nausea, vomiting, diarrhea, bright red blood per rectum, melena, or hematemesis Neurologic:  No visual changes, wkns, changes in mental status. All other systems reviewed and are otherwise negative except as noted above.       Physical Exam    VS:  BP 128/62   Pulse 70   Ht 5' 7.5" (1.715 m)   Wt 166 lb 12.8 oz (75.7 kg)   SpO2 98%   BMI 25.74 kg/m  , BMI Body mass index is 25.74 kg/m.     GEN: Well nourished, well developed, in no acute distress.  Pale HEENT: normal. Neck: Supple, no JVD, carotid bruits, or masses. Cardiac: RRR, no murmurs, rubs, or gallops. No clubbing, cyanosis, edema.  Radials/DP/PT 2+ and equal bilaterally.  Respiratory:  Respirations regular and unlabored, clear to auscultation bilaterally. GI: Soft, nontender, nondistended, BS + x 4. MS: no deformity or atrophy. GU: Hematuria noted in urinary catheter leg bag. Skin: warm and dry, no rash. Neuro:  Strength and sensation are intact. Psych: Normal affect.      Lab Results  Component  Value Date   WBC 25.2 (H) 12/01/2022   HGB 8.5 (L) 12/01/2022   HCT 28.7 (L) 12/01/2022   MCV 77.2 (L) 12/01/2022   PLT 170 12/01/2022   Lab Results  Component Value Date   CREATININE 1.13 12/02/2022   BUN 27 (H) 12/02/2022   NA 135 12/02/2022   K 4.1 12/02/2022   CL 102 12/02/2022   CO2 23 12/02/2022   Lab Results  Component Value Date   ALT 16 11/30/2022   AST 20 11/30/2022   ALKPHOS 58 11/30/2022   BILITOT 0.8 11/30/2022   Lab Results  Component Value Date   CHOL 61 12/01/2022   HDL 29 (L) 12/01/2022   LDLCALC 23 12/01/2022   TRIG 45 12/01/2022   CHOLHDL 2.1 12/01/2022    Lab Results  Component Value Date   HGBA1C 6.6 (H)  11/30/2022     Review of Prior Studies    LHC 12/01/2022  Mid LM lesion is 50% stenosed.   Ost LAD to Prox LAD lesion is 100% stenosed.   Mid LAD-2 lesion is 20% stenosed.   Dist LAD lesion is 25% stenosed.   Ost Cx lesion is 40% stenosed.   Prox Cx lesion is 30% stenosed.   Mid Cx lesion is 75% stenosed.   Ost RCA to Dist RCA lesion is 100% stenosed.   Ost Ramus lesion is 95% stenosed.   Prox Graft to Dist Graft lesion between Ramus and 2nd Mrg  is 100% stenosed.   Origin to Mid Graft lesion before Prox LAD  is 100% stenosed.   Origin to Prox Graft lesion is 100% stenosed.   Ost 3rd Mrg to 3rd Mrg lesion is 99% stenosed.   Prox Graft lesion is 70% stenosed.   Non-stenotic Mid LAD-1 lesion.   Non-stenotic Mid Graft to Dist Graft lesion was previously treated.   Non-stenotic Mid Graft lesion was previously treated.   Non-stenotic Mid Graft to Dist Graft lesion was previously treated.   LV end diastolic pressure is moderately elevated.   Severe 3 vessel obstructive CAD Patent LIMA to the LAD Known occlusion of SVG to LCx Patent SVG to diagonal. Multiple prior stents. Within the proximal stent there is a focal 70% stenosis. Other stented segments look good.  Occluded SVG to RCA. This is new. There are some left to right collaterals to  distal RCA Moderately elevated LVEDP 27 mm Hg   Plan: SVG to RCA cannot be salvaged. This appears to be the culprit. Recommend continued medical therapy. If refractory angina could consider balloon angioplasty of in stent disease in SVG to diagonal but long term patency would be limited.    Echocardiogram 12/02/2022   1. Left ventricular ejection fraction, by estimation, is 45 to 50%. The  left ventricle has mildly decreased function. The left ventricle  demonstrates global hypokinesis. The left ventricular internal cavity size  was mildly to moderately dilated. Left  ventricular diastolic parameters are consistent with Grade II diastolic  dysfunction (pseudonormalization).   2. Right ventricular systolic function is normal. The right ventricular  size is normal. There is normal pulmonary artery systolic pressure.   3. Left atrial size was moderately dilated.   4. Right atrial size was moderately dilated.   5. The mitral valve is normal in structure. Trivial mitral valve  regurgitation. No evidence of mitral stenosis.   6. The aortic valve is tricuspid. There is mild calcification of the  aortic valve. There is mild thickening of the aortic valve. Aortic valve  regurgitation is mild.   7. The inferior vena cava is normal in size with greater than 50%  respiratory variability, suggesting right atrial pressure of 3 mmHg.   Comparison(s): Changes from prior study are noted. Mild to moderately  dilated LV with mildly reduced function but no apparent focal wall motion  abnormalities.   Assessment & Plan   1.  Multivessel coronary artery disease: The patient will be treated medically as it is extensive and no areas of intervention can be determined which would make improvement in his overall cardiovascular status.  He will be continued to be treated medically with secondary prevention, blood pressure control, and statin therapy.  He will continue on nitrates for recurrent chronic angina at  baseline.  Multiple stents in situ along with CABG.  Dr. Swaziland did state that consideration for balloon angioplasty of in-stent  disease of the SVG to the diagonal could be considered but long-term patency would be very limited.  2.  Sick sinus syndrome: Pacemaker in situ, Medtronic Adapta.  Interrogation remotely and in person per protocol.  3.  Hypertension well-controlled today.  Continue current medication regimen.  4.  PAD: Patient has had intervention per Dr. Allyson Sabal with PTA and stenting of the left external iliac artery in 2017.  Continue to be treated with statin, aspirin, and clopidogrel.  I have spoken with Dr. Gery Pray concerning stopping Plavix as the patient is having hematuria.  He has agreed that this will be safe to do.  5.  Atrial fibrillation: Remains on Xarelto 15 mg daily with weight and age calculated dose.  Metoprolol for rate control.  I have spent 45 minutes speaking with patient, daughter and with Dr. Allyson Sabal concerning patient medications and planned treatment.        Signed, Bettey Mare. Liborio Nixon, ANP, AACC   12/13/2022 1:04 PM      Office [phone] Fax [phone]  Notice: This dictation was prepared with Dragon dictation along with smaller phrase technology. Any transcriptional errors that result from this process are unintentional and may not be corrected upon review.

## 2022-12-12 ENCOUNTER — Ambulatory Visit: Payer: Medicare HMO

## 2022-12-12 DIAGNOSIS — R310 Gross hematuria: Secondary | ICD-10-CM | POA: Diagnosis not present

## 2022-12-12 DIAGNOSIS — R339 Retention of urine, unspecified: Secondary | ICD-10-CM | POA: Diagnosis not present

## 2022-12-12 MED ORDER — ACETAMINOPHEN 325 MG PO TABS: 650.0000 mg | ORAL_TABLET | Freq: Once | ORAL | Status: DC

## 2022-12-12 MED ORDER — SODIUM CHLORIDE 0.9 % IV SOLN
200.0000 mg | Freq: Once | INTRAVENOUS | Status: DC
  Filled 2022-12-12: qty 10

## 2022-12-12 MED ORDER — DIPHENHYDRAMINE HCL 25 MG PO CAPS: 25.0000 mg | ORAL_CAPSULE | Freq: Once | ORAL | Status: DC

## 2022-12-13 ENCOUNTER — Ambulatory Visit: Payer: Medicare HMO | Attending: Adult Health | Admitting: Adult Health

## 2022-12-13 ENCOUNTER — Encounter: Payer: Self-pay | Admitting: Adult Health

## 2022-12-13 VITALS — BP 128/62 | HR 70 | Ht 67.5 in | Wt 166.8 lb

## 2022-12-13 DIAGNOSIS — I480 Paroxysmal atrial fibrillation: Secondary | ICD-10-CM | POA: Diagnosis not present

## 2022-12-13 NOTE — Patient Instructions (Addendum)
Medication Instructions:  Stop Plavix *If you need a refill on your cardiac medications before your next appointment, please call your pharmacy*   Lab Work: No Labs If you have labs (blood work) drawn today and your tests are completely normal, you will receive your results only by: MyChart Message (if you have MyChart) OR A paper copy in the mail If you have any lab test that is abnormal or we need to change your treatment, we will call you to review the results.   Testing/Procedures: No Testing   Follow-Up: At [facility], you and your health needs are our priority.  As part of our continuing mission to provide you with exceptional heart care, we have created designated Provider Care Teams.  These Care Teams include your primary Cardiologist (physician) and Advanced Practice Providers (APPs -  Physician Assistants and Nurse Practitioners) who all work together to provide you with the care you need, when you need it.  We recommend signing up for the patient portal called "MyChart".  Sign up information is provided on this After Visit Summary.  MyChart is used to connect with patients for Virtual Visits (Telemedicine).  Patients are able to view lab/test results, encounter notes, upcoming appointments, etc.  Non-urgent messages can be sent to your provider as well.   To learn more about what you can do with MyChart, go to [email or web address].    Your next appointment:   Keep Scheduled Appointment  Provider:   Nanetta Batty, MD

## 2022-12-14 ENCOUNTER — Inpatient Hospital Stay: Payer: Medicare HMO | Admitting: Hematology and Oncology

## 2022-12-14 ENCOUNTER — Inpatient Hospital Stay: Payer: Medicare HMO | Attending: Hematology and Oncology

## 2022-12-14 VITALS — BP 120/66 | HR 94 | Temp 97.4°F | Resp 16 | Wt 164.9 lb

## 2022-12-14 DIAGNOSIS — Z79899 Other long term (current) drug therapy: Secondary | ICD-10-CM | POA: Diagnosis not present

## 2022-12-14 DIAGNOSIS — D500 Iron deficiency anemia secondary to blood loss (chronic): Secondary | ICD-10-CM

## 2022-12-14 DIAGNOSIS — Z87891 Personal history of nicotine dependence: Secondary | ICD-10-CM | POA: Diagnosis not present

## 2022-12-14 DIAGNOSIS — C9110 Chronic lymphocytic leukemia of B-cell type not having achieved remission: Secondary | ICD-10-CM | POA: Insufficient documentation

## 2022-12-14 DIAGNOSIS — D72820 Lymphocytosis (symptomatic): Secondary | ICD-10-CM

## 2022-12-14 DIAGNOSIS — D509 Iron deficiency anemia, unspecified: Secondary | ICD-10-CM | POA: Diagnosis not present

## 2022-12-14 LAB — CBC WITH DIFFERENTIAL (CANCER CENTER ONLY)
Abs Immature Granulocytes: 0.09 10*3/uL — ABNORMAL HIGH (ref 0.00–0.07)
Basophils Absolute: 0.1 10*3/uL (ref 0.0–0.1)
Basophils Relative: 0 %
Eosinophils Absolute: 0.1 10*3/uL (ref 0.0–0.5)
Eosinophils Relative: 1 %
HCT: 34.6 % — ABNORMAL LOW (ref 39.0–52.0)
Hemoglobin: 10.4 g/dL — ABNORMAL LOW (ref 13.0–17.0)
Immature Granulocytes: 0 %
Lymphocytes Relative: 65 %
Lymphs Abs: 15.7 10*3/uL — ABNORMAL HIGH (ref 0.7–4.0)
MCH: 23.9 pg — ABNORMAL LOW (ref 26.0–34.0)
MCHC: 30.1 g/dL (ref 30.0–36.0)
MCV: 79.4 fL — ABNORMAL LOW (ref 80.0–100.0)
Monocytes Absolute: 0.8 10*3/uL (ref 0.1–1.0)
Monocytes Relative: 3 %
Neutro Abs: 7.5 10*3/uL (ref 1.7–7.7)
Neutrophils Relative %: 31 %
Platelet Count: 233 10*3/uL (ref 150–400)
RBC: 4.36 MIL/uL (ref 4.22–5.81)
RDW: 22.6 % — ABNORMAL HIGH (ref 11.5–15.5)
WBC Count: 24.3 10*3/uL — ABNORMAL HIGH (ref 4.0–10.5)
nRBC: 0 % (ref 0.0–0.2)

## 2022-12-14 LAB — CMP (CANCER CENTER ONLY)
ALT: 14 U/L (ref 0–44)
AST: 12 U/L — ABNORMAL LOW (ref 15–41)
Albumin: 3.9 g/dL (ref 3.5–5.0)
Alkaline Phosphatase: 63 U/L (ref 38–126)
Anion gap: 7 (ref 5–15)
BUN: 15 mg/dL (ref 8–23)
CO2: 27 mmol/L (ref 22–32)
Calcium: 9.2 mg/dL (ref 8.9–10.3)
Chloride: 110 mmol/L (ref 98–111)
Creatinine: 1.03 mg/dL (ref 0.61–1.24)
GFR, Estimated: >60 mL/min (ref >60)
Glucose, Bld: 141 mg/dL — ABNORMAL HIGH (ref 70–99)
Potassium: 4.5 mmol/L (ref 3.5–5.1)
Sodium: 144 mmol/L (ref 135–145)
Total Bilirubin: 0.6 mg/dL (ref 0.3–1.2)
Total Protein: 6.4 g/dL — ABNORMAL LOW (ref 6.5–8.1)

## 2022-12-14 LAB — IRON AND IRON BINDING CAPACITY (CC-WL,HP ONLY)
Iron: 39 ug/dL — ABNORMAL LOW (ref 45–182)
Saturation Ratios: 11 % — ABNORMAL LOW (ref 17.9–39.5)
TIBC: 358 ug/dL (ref 250–450)
UIBC: 319 ug/dL (ref 117–376)

## 2022-12-14 LAB — SAMPLE TO BLOOD BANK

## 2022-12-14 LAB — FERRITIN: Ferritin: 133 ng/mL (ref 24–336)

## 2022-12-14 NOTE — Progress Notes (Signed)
[facility] [phone]   [phone]  PROGRESS NOTE  Patient Care Team: Thana Ates, MD as PCP - General (Internal Medicine) Runell Gess, MD as PCP - Cardiology (Cardiology) Thurmon Fair, MD as PCP - Electrophysiology (Cardiology) Runell Gess, MD as Consulting Physician (Cardiology) Duke, Roe Rutherford, PA as Physician Assistant (Cardiology)  Hematological/Oncological History # CLL Rai Stage 0. Del 13 # Iron Deficiency Anemia  1) 08/09/2021: Labs from PCP, Dr. [name]: --WBC 18.6, Hgb 11.7, MCV 80.4, Plt 164, Lymph 12.40.  2)  08/24/2021: Establish care with [facility].  Flow cytometry results most consistent with CLL  Interval History:  Robert Burns 82 y.o. male with medical history significant for newly diagnosed CLL who presents for a follow up visit. The patient's last visit was on 11/15/2022. In the interim since the last visit he had a recent stay in the hospital for NSTEMI and was discharged on 12/02/2022.  Additionally he has received 4 of our planned 5 doses of IV iron.  On exam today Robert Burns reports he had "a terrible episode" in the interim since our last visit.  He underwent cardiac evaluation at [facility] during an admission in August ending on 12/02/2022.  He reports that he underwent catheterization into the heart.  He reports that there are no major interventions at this time other than medical interventions for his situation.  He also recently went to [facility] hospital because he was retaining fluid at which time he had a catheter placed which is now attached to his leg.  He notes that he is also going to have a GI doctor evaluate him next week.  He reports he has been quite busy.  He notes that he is also currently holding his Plavix because he was told he "was not enough blood thinners".  He is also been having some blood in his urine for which she will be seeing a urologist.  He notes that despite all this he  continues to have every other day bowel movements with no clear signs of blood loss.  He is not having any lightheadedness, dizziness, or shortness of breath.  He continues to eat a strict cardiac diet with no salt and plenty of vegetables.  He reports his energy levels are not very good but he does not think they are any worse than during her prior visit.  He has been tolerating his iron infusions well without any difficulty.  He otherwise denies any fevers, chills, sweats, nausea, vomiting or diarrhea.  Full 10 point ROS is listed below.   MEDICAL HISTORY:  Past Medical History:  Diagnosis Date   Anemia    Anxiety    Arthritis    "all over" (11/25/2015)   CAD (coronary artery disease)    a. s/p CABG  06/2002; b. 02/01/15 PCI: DES to prox SVG to PDA, staged PCI of SVG to Diag in 02/2015; c. 04/2017 Cath/PCI: LM nl, LAD 100ost, 20m, 75d, LCX 60ost, OM2 80, RCA 100ost, RPDA 80, LIMA->LAD ok, VG->D1 patent stent, VG->RPDA patent stent, 50p, VG->OM1->OM2 90p (3.0x24 Synergy DES), 100 between OM1->OM2 (med rx).   Cancer [facility])    Right Shoulder, Left Leg- BCC, SCC, AND MELANOMA   Colon polyp    Elevated cholesterol    Epithelioid hemangioendothelioma    s/p resection of left SFA/mass with interposition 6 mm GoreTex graft 06/02/10, s/p repeat resection for positive margins 09/22/10   High blood pressure    Hyperlipidemia  Hypertension    Leg pain    OSA on CPAP    PAD (peripheral artery disease) (HCC)    a. 10/2002 L SFA PTA/BMS; b. 8/17 LE Angio: LEIA 90 (9x40 self exp stent), LSFA short segment prox occlusion (staged PTA/stenting 01/03/2016), patent mid stent, RSFA 12m (staged PTA/DEB 02/14/2016).   Presence of permanent cardiac pacemaker    Medtronic   SSS (sick sinus syndrome) (HCC)    a. s/p PPM in 2007 with gen change 04/2015 - Medtronic Adapta ADDRL1, ser # [id number].   Type II diabetes mellitus (HCC)    Type II   Urgency of urination     SURGICAL HISTORY: Past Surgical History:   Procedure Laterality Date   CARDIAC CATHETERIZATION N/A 02/01/2015   Procedure: Left Heart Cath and Coronary Angiography;  Surgeon: Runell Gess, MD;  Location: [facility] INVASIVE CV LAB;  Service: Cardiovascular;  Laterality: N/A;   CARDIAC CATHETERIZATION N/A 02/01/2015   Procedure: Coronary Stent Intervention;  Surgeon: Runell Gess, MD;  Location: [facility] INVASIVE CV LAB;  Service: Cardiovascular;  Laterality: N/A;   CARDIAC CATHETERIZATION  06/2002   "just before bypass OR"   CARDIAC CATHETERIZATION N/A 03/01/2015   Procedure: Coronary Stent Intervention;  Surgeon: Runell Gess, MD;  Location: [facility] INVASIVE CV LAB;  Service: Cardiovascular;  Laterality: N/A;   CARDIAC CATHETERIZATION  02/06/2018   COLONOSCOPY     CORONARY ANGIOPLASTY     CORONARY ARTERY BYPASS GRAFT  06/2002   x5, LIMA-LAD;VG- Diag; seq VG- ramus & OM branch; VG-PDA   CORONARY STENT INTERVENTION N/A 04/12/2017   Procedure: CORONARY STENT INTERVENTION;  Surgeon: Runell Gess, MD;  Location: [facility] INVASIVE CV LAB;  Service: Cardiovascular;  Laterality: N/A;   CORONARY STENT INTERVENTION N/A 02/08/2018   Procedure: CORONARY STENT INTERVENTION;  Surgeon: Corky Crafts, MD;  Location: [facility] INVASIVE CV LAB;  Service: Cardiovascular;  Laterality: N/A;   CORONARY STENT INTERVENTION N/A 11/12/2018   Procedure: CORONARY STENT INTERVENTION;  Surgeon: Iran Ouch, MD;  Location: [facility] INVASIVE CV LAB;  Service: Cardiovascular;  Laterality: N/A;   CORONARY STENT INTERVENTION N/A 06/22/2020   Procedure: CORONARY STENT INTERVENTION;  Surgeon: Corky Crafts, MD;  Location: [facility] INVASIVE CV LAB;  Service: Cardiovascular;  Laterality: N/A;   EP IMPLANTABLE DEVICE N/A 04/23/2015   Procedure: PPM Generator Changeout;  Surgeon: Thurmon Fair, MD;  Location: [facility] INVASIVE CV LAB;  Service: Cardiovascular;  Laterality: N/A;   FALSE ANEURYSM REPAIR Left 11/29/2018   Procedure: REPAIR FALSE ANEURYSM LEFT RADIAL ARTERY;  Surgeon: Larina Earthly, MD;  Location: [facility] OR;  Service: Vascular;  Laterality: Left;   FEMORAL ARTERY STENT Left ~ 2014   "taken out of my leg; couldn' catorgorize what kind so the put it under all 3"; cataroziepitheloid hemanioendotheliomau   FOOT FRACTURE SURGERY Left 1973   FRACTURE SURGERY     INSERT / REPLACE / REMOVE PACEMAKER  10/13/05   right side, medtronic Adapta   KNEE HARDWARE REMOVAL Right 1950's   "3-4 months after the insertion"   KNEE SURGERY Right 1950's   "broke my lower leg; had to put pin in my knee to keep lower leg in place til it healed"   LEFT HEART CATH AND CORS/GRAFTS ANGIOGRAPHY N/A 04/12/2017   Procedure: LEFT HEART CATH AND CORS/GRAFTS ANGIOGRAPHY;  Surgeon: Runell Gess, MD;  Location: [facility] INVAS[facility] CV LAB;  Service: Cardiovascular;  Laterality: N/A;   LEFT HEART CATH AND CORS/GRAFTS ANGIOGRAPHY N/A 02/06/2018   Procedure:  LEFT HEART CATH AND CORS/GRAFTS ANGIOGRAPHY;  Surgeon: Lennette Bihari, MD;  Location: [facility] INVASIVE CV LAB;  Service: Cardiovascular;  Laterality: N/A;   LEFT HEART CATH AND CORS/GRAFTS ANGIOGRAPHY N/A 11/12/2018   Procedure: LEFT HEART CATH AND CORS/GRAFTS ANGIOGRAPHY;  Surgeon: Iran Ouch, MD;  Location: [facility] INVASIVE CV LAB;  Service: Cardiovascular;  Laterality: N/A;   LEFT HEART CATH AND CORS/GRAFTS ANGIOGRAPHY N/A 06/22/2020   Procedure: LEFT HEART CATH AND CORS/GRAFTS ANGIOGRAPHY;  Surgeon: Corky Crafts, MD;  Location: [facility] INVASIVE CV LAB;  Service: Cardiovascular;  Laterality: N/A;   LEFT HEART CATH AND CORS/GRAFTS ANGIOGRAPHY N/A 12/01/2022   Procedure: LEFT HEART CATH AND CORS/GRAFTS ANGIOGRAPHY;  Surgeon: [place]az[place], Peter M, MD;  Location: [facility] INVASIVE CV LAB;  Service: Cardiovascular;  Laterality: N/A;   PERIPHERAL VASCULAR CATHETERIZATION N/A 11/25/2015   Procedure: Lower Extremity Angiography;  Surgeon: Runell Gess, MD;  Location: [facility] INVASIVE CV LAB;  Service: Cardiovascular;  Laterality: N/A;   PERIPHERAL VASCULAR CATHETERIZATION Left 11/25/2015    Procedure: Peripheral Vascular Intervention;  Surgeon: Runell Gess, MD;  Location: [facility] INVASIVE CV LAB;  Service: Cardiovascular;  Laterality: Left;  external iliac   PERIPHERAL VASCULAR CATHETERIZATION N/A 01/03/2016   Procedure: Lower Extremity Angiography;  Surgeon: Runell Gess, MD;  Location: [facility] INVASIVE CV LAB;  Service: Cardiovascular;  Laterality: N/A;   PERIPHERAL VASCULAR CATHETERIZATION Left 01/03/2016   Procedure: Peripheral Vascular Intervention;  Surgeon: Runell Gess, MD;  Location: [facility] INVASIVE CV LAB;  Service: Cardiovascular;  Laterality: Left;  SFA   PERIPHERAL VASCULAR CATHETERIZATION Right 02/14/2016   Procedure: Peripheral Vascular Atherectomy;  Surgeon: Runell Gess, MD;  Location: [facility] INVASIVE CV LAB;  Service: Cardiovascular;  Laterality: Right;  SFA   POPLITEAL ARTERY STENT  01/03/2016   Contralateral access with a 7 [name] crossover sheath (second order catheter placement)   TONSILLECTOMY AND ADENOIDECTOMY     TUMOR EXCISION Right ~ 2005   cancerous tumor removed from shoulder   TUMOR EXCISION Right ~ 2000   benign tumor removed from under shoulder   TUMOR EXCISION Left 06/02/2010   resection of Lt SFA wth interposition of Gore-Tex graft    SOCIAL HISTORY: Social History   Socioeconomic History   Marital status: Married    Spouse name: Not on file   Number of children: 2   Years of education: Not on file   Highest education level: Not on file  Occupational History   Occupation: retired  Tobacco Use   Smoking status: Former    Types: Pipe    Quit date: 1973    Years since quitting: 51.7   Smokeless tobacco: Never   Tobacco comments:    "quit smoking in 1973"  Vaping Use   Vaping status: Never Used  Substance and Sexual Activity   Alcohol use: No   Drug use: No   Sexual activity: Not Currently  Other Topics Concern   Not on file  Social History Narrative   Not on file   Social Determinants of Health   Financial Resource Strain:  Not on file  Food Insecurity: No Food Insecurity (12/07/2022)   Received from [facility]   Hunger Vital Sign    Worried About Running Out of Food in the Last Year: Never true    Ran Out of Food in the Last Year: Never true  Transportation Needs: No Transportation Needs (12/11/2022)   Received from [facility] - Transportation    Lack of Transportation (  Medical): No    Lack of Transportation (Non-Medical): No  Physical Activity: Not on file  Stress: No Stress Concern Present (12/07/2022)   Received from [facility] - Occupational Stress Questionnaire    Feeling of Stress : Not at all  Social Connections: Unknown (08/18/2021)   Received from [facility]   Social Network    Social Network: Not on file  Intimate Partner Violence: Not At Risk (12/07/2022)   Received from [facility]   HITS    Over the last 12 months how often did your partner physically hurt you?: 1    Over the last 12 months how often did your partner insult you or talk down to you?: 1    Over the last 12 months how often did your partner threaten you with physical harm?: 1    Over the last 12 months how often did your partner scream or curse at you?: 1    FAMILY HISTORY: Family History  Problem Relation Age of Onset   Coronary artery disease Mother    Heart attack Mother    Hypertension Mother    Heart disease Mother        Open  Heart surgery   Diabetes Father    Heart disease Father    Hyperlipidemia Father    Heart attack Father    Hypertension Sister    Diabetes Sister    Heart disease Sister    Liver disease Neg Hx    Esophageal cancer Neg Hx    Colon cancer Neg Hx     ALLERGIES:  is allergic to peanut-containing drug products, ace inhibitors, diltiazem hcl, meloxicam, doxycycline, eliquis [apixaban], sertraline hcl, carvedilol, clonidine hcl, and duloxetine hcl.  MEDICATIONS:  Current Outpatient Medications  Medication Sig  Dispense Refill   atorvastatin (LIPITOR) 80 MG tablet Take 1 tablet (80 mg total) by mouth daily at 6 PM. 90 tablet 1   Cholecalciferol (VITAMIN D) 50 MCG (2000 UT) CAPS Take by mouth.     empagliflozin (JARDIANCE) 10 MG TABS tablet Take 1 tablet (10 mg total) by mouth daily. 30 tablet 0   finasteride (PROSCAR) 5 MG tablet Take 5 mg by mouth daily.     glipiZIDE (GLUCOTROL) 5 MG tablet Take 5 mg by mouth daily with breakfast.      HYDROcodone-acetaminophen (NORCO/VICODIN) 5-325 MG tablet Take 1 tablet by mouth every 6 (six) hours as needed for moderate pain.     isosorbide mononitrate (IMDUR) 60 MG 24 hr tablet Take 1.5 tablets (90 mg total) by mouth daily. 90 tablet 3   melatonin 5 MG TABS Take 5 mg by mouth at bedtime.     metFORMIN (GLUCOPHAGE) 1000 MG tablet Take 1 tablet (1,000 mg total) by mouth 2 (two) times daily with a meal. Restart on 3/18.     metoprolol tartrate (LOPRESSOR) 100 MG tablet Take 1 tablet (100 mg total) by mouth 2 (two) times daily. 180 tablet 3   MULTIPLE VITAMIN-FOLIC ACID PO Take 1 tablet by mouth daily.     nitroGLYCERIN (NITROSTAT) 0.4 MG SL tablet Place 1 tablet (0.4 mg total) under the tongue every 5 (five) minutes as needed for chest pain. 25 tablet 3   ranolazine (RANEXA) 1000 MG SR tablet Take 1 tablet (1,000 mg total) by mouth 2 (two) times daily. 60 tablet 5   Rivaroxaban (XARELTO) 15 MG TABS tablet Take 1 tablet (15 mg total) by mouth daily with supper. 30 tablet  0   sacubitril-valsartan (ENTRESTO) 49-51 MG Take 1 tablet by mouth 2 (two) times daily. 60 tablet 0   tamsulosin (FLOMAX) 0.4 MG CAPS capsule Take 0.4 mg by mouth 2 (two) times daily.      No current facility-administered medications for this visit.    REVIEW OF SYSTEMS:   Constitutional: ( - ) fevers, ( - )  chills , ( - ) night sweats Eyes: ( - ) blurriness of vision, ( - ) double vision, ( - ) watery eyes Ears, nose, mouth, throat, and face: ( - ) mucositis, ( - ) sore throat Respiratory: (  - ) cough, ( - ) dyspnea, ( - ) wheezes Cardiovascular: ( - ) palpitation, ( - ) chest discomfort, ( - ) lower extremity swelling Gastrointestinal:  ( - ) nausea, ( - ) heartburn, ( - ) change in bowel habits Skin: ( - ) abnormal skin rashes Lymphatics: ( - ) new lymphadenopathy, ( - ) easy bruising Neurological: ( - ) numbness, ( - ) tingling, ( - ) new weaknesses Behavioral/Psych: ( - ) mood change, ( - ) new changes  All other systems were reviewed with the patient and are negative.  PHYSICAL EXAMINATION: ECOG PERFORMANCE STATUS: 0 - Asymptomatic  Vitals:   12/14/22 1124  BP: 120/66  Pulse: 94  Resp: 16  Temp: (!) 97.4 F (36.3 C)  SpO2: 100%     Filed Weights   12/14/22 1124  Weight: 164 lb 14.4 oz (74.8 kg)      GENERAL: Well-appearing elderly Caucasian male, alert, no distress and comfortable SKIN: skin color, texture, turgor are normal, no rashes or significant lesions EYES: conjunctiva are pink and non-injected, sclera clear NECK: supple, non-tender LYMPH:  no palpable lymphadenopathy in the cervical, axillary or inguinal LUNGS: clear to auscultation and percussion with normal breathing effort HEART: regular rate & rhythm and no murmurs and no lower extremity edema ABDOMEN: No evidence of hepatosplenomegaly.  Soft, non-tender, non-distended, normal bowel sounds Musculoskeletal: no cyanosis of digits and no clubbing  PSYCH: alert & oriented x 3, fluent speech NEURO: no focal motor/sensory deficits  LABORATORY DATA:  I have reviewed the data as listed    Latest Ref Rng & Units 12/14/2022   11:00 AM 12/01/2022    3:29 AM 11/30/2022    9:16 AM  CBC  WBC 4.0 - 10.5 K/uL 24.3  25.2  32.6   Hemoglobin 13.0 - 17.0 g/dL 28.4  8.5  9.6   Hematocrit 39.0 - 52.0 % 34.6  28.7  33.5   Platelets 150 - 400 K/uL 233  170  175        Latest Ref Rng & Units 12/14/2022   11:00 AM 12/02/2022    3:49 AM 12/01/2022    3:29 AM  CMP  Glucose 70 - 99 mg/dL 132  440  102   BUN 8 -  23 mg/dL 15  27  28    Creatinine 0.61 - 1.24 mg/dL 7.25  3.66  4.40   Sodium 135 - 145 mmol/L 144  135  136   Potassium 3.5 - 5.1 mmol/L 4.5  4.1  4.0   Chloride 98 - 111 mmol/L 110  102  105   CO2 22 - 32 mmol/L 27  23  22    Calcium 8.9 - 10.3 mg/dL 9.2  8.8  9.2   Total Protein 6.5 - 8.1 g/dL 6.4     Total Bilirubin 0.3 - 1.2 mg/dL 0.6  Alkaline Phos 38 - 126 U/L 63     AST 15 - 41 U/L 12     ALT 0 - 44 U/L 14       RADIOGRAPHIC STUDIES: CARDIAC CATHETERIZATION  Result Date: 12/01/2022   Mid LM lesion is 50% stenosed.   Ost LAD to Prox LAD lesion is 100% stenosed.   Mid LAD-2 lesion is 20% stenosed.   Dist LAD lesion is 25% stenosed.   Ost Cx lesion is 40% stenosed.   Prox Cx lesion is 30% stenosed.   Mid Cx lesion is 75% stenosed.   Ost RCA to Dist RCA lesion is 100% stenosed.   Ost Ramus lesion is 95% stenosed.   Prox Graft to Dist Graft lesion between Ramus and 2nd Mrg  is 100% stenosed.   Origin to Mid Graft lesion before Prox LAD  is 100% stenosed.   Origin to Prox Graft lesion is 100% stenosed.   Ost 3rd Mrg to 3rd Mrg lesion is 99% stenosed.   Prox Graft lesion is 70% stenosed.   Non-stenotic Mid LAD-1 lesion.   Non-stenotic Mid Graft to Dist Graft lesion was previously treated.   Non-stenotic Mid Graft lesion was previously treated.   Non-stenotic Mid Graft to Dist Graft lesion was previously treated.   LV end diastolic pressure is moderately elevated. Severe 3 vessel obstructive CAD Patent LIMA to the LAD Known occlusion of SVG to LCx Patent SVG to diagonal. Multiple prior stents. Within the proximal stent there is a focal 70% stenosis. Other stented segments look good. Occluded SVG to RCA. This is new. There are some left to right collaterals to distal RCA Moderately elevated LVEDP 27 mm Hg Plan: SVG to RCA cannot be salvaged. This appears to be the culprit. Recommend continued medical therapy. If refractory angina could consider balloon angioplasty of in stent disease in SVG to  diagonal but long term patency would be limited.   ECHOCARDIOGRAM COMPLETE  Result Date: 11/30/2022    ECHOCARDIOGRAM REPORT   Patient Name:   Robert Burns Date of Exam: 11/30/2022 Medical Rec #:  [id number]       Height:       67.0 in Accession #:    [id number]      Weight:       163.0 lb Date of Birth:  08-Nov-1940       BSA:          1.854 m Patient Age:    35 years        BP:           139/66 mmHg Patient Gender: M               HR:           85 bpm. Exam Location:  Inpatient Procedure: 2D Echo, Cardiac Doppler, Color Doppler and Intracardiac            Opacification Agent Indications:    NSTEMI  History:        Patient has prior history of Echocardiogram examinations, most                 recent 06/22/2020. CAD, Pacemaker, PAD, Arrythmias:SSS; Risk                 Factors:Diabetes, Hypertension, Dyslipidemia and Sleep Apnea.                 Cancer.  Sonographer:    Milda Smart Referring Phys: [id number] SHENG L HALEY  Sonographer  Comments: Image acquisition challenging due to patient body habitus. IMPRESSIONS  1. Left ventricular ejection fraction, by estimation, is 45 to 50%. The left ventricle has mildly decreased function. The left ventricle demonstrates global hypokinesis. The left ventricular internal cavity size was mildly to moderately dilated. Left ventricular diastolic parameters are consistent with Grade II diastolic dysfunction (pseudonormalization).  2. Right ventricular systolic function is normal. The right ventricular size is normal. There is normal pulmonary artery systolic pressure.  3. Left atrial size was moderately dilated.  4. Right atrial size was moderately dilated.  5. The mitral valve is normal in structure. Trivial mitral valve regurgitation. No evidence of mitral stenosis.  6. The aortic valve is tricuspid. There is mild calcification of the aortic valve. There is mild thickening of the aortic valve. Aortic valve regurgitation is mild.  7. The inferior vena cava is normal in size  with greater than 50% respiratory variability, suggesting right atrial pressure of 3 mmHg. Comparison(s): Changes from prior study are noted. Mild to moderately dilated LV with mildly reduced function but no apparent focal wall motion abnormalities. FINDINGS  Left Ventricle: Left ventricular ejection fraction, by estimation, is 45 to 50%. The left ventricle has mildly decreased function. The left ventricle demonstrates global hypokinesis. Definity contrast agent was given IV to delineate the left ventricular  endocardial borders. The left ventricular internal cavity size was mildly to moderately dilated. There is no left ventricular hypertrophy. Left ventricular diastolic parameters are consistent with Grade II diastolic dysfunction (pseudonormalization). Right Ventricle: The right ventricular size is normal. Right vetricular wall thickness was not well visualized. Right ventricular systolic function is normal. There is normal pulmonary artery systolic pressure. The tricuspid regurgitant velocity is 2.78 m/s, and with an assumed right atrial pressure of 3 mmHg, the estimated right ventricular systolic pressure is 33.9 mmHg. Left Atrium: Left atrial size was moderately dilated. Right Atrium: Right atrial size was moderately dilated. Pericardium: There is no evidence of pericardial effusion. Mitral Valve: The mitral valve is normal in structure. Trivial mitral valve regurgitation. No evidence of mitral valve stenosis. Tricuspid Valve: The tricuspid valve is normal in structure. Tricuspid valve regurgitation is trivial. No evidence of tricuspid stenosis. Aortic Valve: The aortic valve is tricuspid. There is mild calcification of the aortic valve. There is mild thickening of the aortic valve. Aortic valve regurgitation is mild. Aortic regurgitation PHT measures 605 msec. Pulmonic Valve: The pulmonic valve was grossly normal. Pulmonic valve regurgitation is trivial. No evidence of pulmonic stenosis. Aorta: The aortic  root and ascending aorta are structurally normal, with no evidence of dilitation. Venous: The inferior vena cava is normal in size with greater than 50% respiratory variability, suggesting right atrial pressure of 3 mmHg. IAS/Shunts: The atrial septum is grossly normal. Additional Comments: A device lead is visualized.  LEFT VENTRICLE PLAX 2D LVIDd:         5.40 cm   Diastology LVIDs:         4.40 cm   LV e' medial:    4.68 cm/s LV PW:         1.00 cm   LV E/e' medial:  20.0 LV IVS:        1.10 cm   LV e' lateral:   7.51 cm/s LVOT diam:     2.30 cm   LV E/e' lateral: 12.5 LV SV:         90 LV SV Index:   49 LVOT Area:     4.15 cm  RIGHT VENTRICLE  IVC RV S prime:     11.10 cm/s  IVC diam: 2.00 cm TAPSE (M-mode): 1.9 cm LEFT ATRIUM             Index        RIGHT ATRIUM           Index LA diam:        5.30 cm 2.86 cm/m   RA Area:     23.20 cm LA Vol (A2C):   71.6 ml 38.62 ml/m  RA Volume:   69.10 ml  37.27 ml/m LA Vol (A4C):   74.9 ml 40.40 ml/m LA Biplane Vol: 76.7 ml 41.37 ml/m  AORTIC VALVE LVOT Vmax:   85.10 cm/s LVOT Vmean:  62.400 cm/s LVOT VTI:    0.217 m AI PHT:      605 msec  AORTA Ao Root diam: 3.40 cm Ao Asc diam:  3.70 cm MITRAL VALVE                TRICUSPID VALVE MV Area (PHT): 3.51 cm     TR Peak grad:   30.9 mmHg MV Decel Time: 216 msec     TR Mean grad:   23.0 mmHg MR Peak grad: 29.8 mmHg     TR Vmax:        278.00 cm/s MR Vmax:      273.00 cm/s   TR Vmean:       231.0 cm/s MV E velocity: 93.60 cm/s MV A velocity: 105.00 cm/s  SHUNTS MV E/A ratio:  0.89         Systemic VTI:  0.22 m                             Systemic Diam: 2.30 cm Jodelle Red MD Electronically signed by Jodelle Red MD Signature Date/Time: 11/30/2022/4:57:31 PM    Final    DG Chest Portable 1 View  Result Date: 11/30/2022 CLINICAL DATA:  Chest pain EXAM: PORTABLE CHEST 1 VIEW COMPARISON:  X-ray 02/05/2022 FINDINGS: Sternal wires. Right upper chest battery pack with pacemaker leads along the  right side of the heart are similar to previous. Minimal left basilar atelectasis. No consolidation, pneumothorax, effusion or edema. Calcified aorta. Overlapping cardiac leads. Degenerative changes of the spine and shoulders. IMPRESSION: Postop chest.  Pacemaker.  Minimal left basilar atelectasis or scar Electronically Signed   By: Karen Kays M.D.   On: 11/30/2022 10:12    ASSESSMENT & PLAN Khalen Betton Kopka 82 y.o. male with medical history significant for newly diagnosed CLL who presents for a follow up visit.  Previously we discussed the diagnosis of CLL and treatment options moving forward.  We discussed that this is a chronic condition with no definitive cure.  Additionally we discussed that treatment is reserved until certain criteria are met.  Typically treatment is started with rapid increase in lymphocytes, massively enlarged lymph nodes, anemia, or thrombocytopenia.  The patient currently does not meet any criteria necessary to start treatment.  As such I would recommend continued close observation of his blood counts and symptoms.  The patient knows to call the clinic with any issues involving fevers, chills, sweats, sudden weight loss, or rapidly enlarging lymph node.  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   --Prognostic panel showed del 13 with positive ZAP 70 and negative IgVH --Labs today show white blood cell count 24.3, hemoglobin 10.4,  MCV 79.4, and platelets of 233 --No indication for imaging at this time --No criteria met for beginning treatment.  Would recommend continued monitoring --Return to clinic in 6 months time for further evaluation of CLL, however following him more closely for his iron deficiency anemia.  # Anemia -- Appears microcytic, concern for iron deficiency anemia as the etiology. -- Added on iron panel, ferritin, and reticulocyte panel today -- Patient has completed 4 out of 5 planned IV iron --Iron labs today show  ferritin of 133 with an iron sat of 11%. --Patient notes he will be meeting with gastroenterology shortly as he has an appointment with them and also has an appointment with urology next week for his hematuria and urinary retention. -- Return to clinic 4 weeks to re-evaluate  No orders of the defined types were placed in this encounter.   All questions were answered. The patient knows to call the clinic with any problems, questions or concerns.  A total of more than 30 minutes were spent on this encounter with face-to-face time and non-face-to-face time, including preparing to see the patient, ordering tests and/or medications, counseling the patient and coordination of care as outlined above.   Ulysees Barns, MD Department of Hematology/Oncology [facility] at [facility] Phone: [phone] Pager: [phone] Email: [email or web address]  12/16/2022 4:20 PM  Mathis Fare BD, Catovsky D, Caligaris-Cappio F, Dighiero G, Dhner H, Shidler, Soquel, [place] E, Chiorazzi N, Hoffman, Rai KR, [place], Eichhorst B, O'Brien S, Robak T, Seymour JF, Kipps TJ. iwCLL guidelines for diagnosis, indications for treatment, response assessment, and supportive management of CLL. Blood. 2018 Jun 21;131(25):2745-2760.  Active disease should be clearly documented to initiate therapy. At least 1 of the following criteria should be met.  1) Evidence of progressive marrow failure as manifested by the development of, or worsening of, anemia and/or thrombocytopenia. Cutoff levels of Hb <10 g/dL or platelet counts <295  109/L are generally regarded as indication for treatment. However, in some patients, platelet counts <100  109/L may remain stable over a long period; this situation does not automatically require therapeutic intervention. 2) Massive (ie, ?6 cm below the left costal margin) or progressive or symptomatic splenomegaly. 3) Massive nodes (ie, ?10 cm in  longest diameter) or progressive or symptomatic lymphadenopathy. 4) Progressive lymphocytosis with an increase of ?50% over a 30-month period, or lymphocyte doubling time (LDT) <6 months. LDT can be obtained by linear regression extrapolation of absolute lymphocyte counts obtained at intervals of 2 weeks over an observation period of 2 to 3 months; patients with initial blood lymphocyte counts <30  109/L may require a longer observation period to determine the LDT. Factors contributing to lymphocytosis other than CLL (eg, infections, steroid administration) should be excluded. 5) Autoimmune complications including anemia or thrombocytopenia poorly responsive to corticosteroids. 6) Symptomatic or functional extranodal involvement (eg, skin, kidney, lung, spine). Disease-related symptoms as defined by any of the following: Unintentional weight loss ?10% within the previous 6 months. Significant fatigue (ie, ECOG performance scale 2 or worse; cannot work or unable to perform usual activities). Fevers ?100.97F or 38.0C for 2 or more weeks without evidence of infection. Night sweats for ?1 month without evidence of infection.

## 2022-12-16 ENCOUNTER — Encounter: Payer: Self-pay | Admitting: Hematology and Oncology

## 2022-12-18 ENCOUNTER — Other Ambulatory Visit: Payer: Self-pay

## 2022-12-18 ENCOUNTER — Encounter (HOSPITAL_COMMUNITY): Payer: Self-pay

## 2022-12-18 ENCOUNTER — Emergency Department (HOSPITAL_COMMUNITY): Payer: Medicare HMO

## 2022-12-18 ENCOUNTER — Inpatient Hospital Stay (HOSPITAL_COMMUNITY)
Admission: EM | Admit: 2022-12-18 | Discharge: 2022-12-23 | DRG: 698 | Disposition: A | Discharge status: Home Health | Payer: Medicare HMO | Attending: Internal Medicine | Admitting: Internal Medicine

## 2022-12-18 DIAGNOSIS — I959 Hypotension, unspecified: Secondary | ICD-10-CM | POA: Diagnosis present

## 2022-12-18 DIAGNOSIS — Z83438 Family history of other disorder of lipoprotein metabolism and other lipidemia: Secondary | ICD-10-CM

## 2022-12-18 DIAGNOSIS — I2510 Atherosclerotic heart disease of native coronary artery without angina pectoris: Secondary | ICD-10-CM | POA: Diagnosis not present

## 2022-12-18 DIAGNOSIS — Z888 Allergy status to other drugs, medicaments and biological substances status: Secondary | ICD-10-CM

## 2022-12-18 DIAGNOSIS — Z87892 Personal history of anaphylaxis: Secondary | ICD-10-CM

## 2022-12-18 DIAGNOSIS — G4733 Obstructive sleep apnea (adult) (pediatric): Secondary | ICD-10-CM | POA: Diagnosis present

## 2022-12-18 DIAGNOSIS — N390 Urinary tract infection, site not specified: Secondary | ICD-10-CM | POA: Diagnosis not present

## 2022-12-18 DIAGNOSIS — D6832 Hemorrhagic disorder due to extrinsic circulating anticoagulants: Secondary | ICD-10-CM | POA: Diagnosis present

## 2022-12-18 DIAGNOSIS — N3001 Acute cystitis with hematuria: Secondary | ICD-10-CM | POA: Diagnosis not present

## 2022-12-18 DIAGNOSIS — D62 Acute posthemorrhagic anemia: Secondary | ICD-10-CM | POA: Diagnosis not present

## 2022-12-18 DIAGNOSIS — I25810 Atherosclerosis of coronary artery bypass graft(s) without angina pectoris: Secondary | ICD-10-CM | POA: Diagnosis present

## 2022-12-18 DIAGNOSIS — E785 Hyperlipidemia, unspecified: Secondary | ICD-10-CM | POA: Diagnosis present

## 2022-12-18 DIAGNOSIS — N138 Other obstructive and reflux uropathy: Secondary | ICD-10-CM | POA: Diagnosis present

## 2022-12-18 DIAGNOSIS — Z91018 Allergy to other foods: Secondary | ICD-10-CM

## 2022-12-18 DIAGNOSIS — R54 Age-related physical debility: Secondary | ICD-10-CM | POA: Diagnosis present

## 2022-12-18 DIAGNOSIS — I5043 Acute on chronic combined systolic (congestive) and diastolic (congestive) heart failure: Secondary | ICD-10-CM | POA: Diagnosis not present

## 2022-12-18 DIAGNOSIS — N289 Disorder of kidney and ureter, unspecified: Secondary | ICD-10-CM | POA: Diagnosis not present

## 2022-12-18 DIAGNOSIS — K5730 Diverticulosis of large intestine without perforation or abscess without bleeding: Secondary | ICD-10-CM | POA: Diagnosis not present

## 2022-12-18 DIAGNOSIS — A419 Sepsis, unspecified organism: Secondary | ICD-10-CM | POA: Diagnosis present

## 2022-12-18 DIAGNOSIS — I480 Paroxysmal atrial fibrillation: Secondary | ICD-10-CM | POA: Diagnosis not present

## 2022-12-18 DIAGNOSIS — Z8249 Family history of ischemic heart disease and other diseases of the circulatory system: Secondary | ICD-10-CM

## 2022-12-18 DIAGNOSIS — E118 Type 2 diabetes mellitus with unspecified complications: Secondary | ICD-10-CM | POA: Diagnosis present

## 2022-12-18 DIAGNOSIS — E611 Iron deficiency: Secondary | ICD-10-CM | POA: Diagnosis not present

## 2022-12-18 DIAGNOSIS — R319 Hematuria, unspecified: Secondary | ICD-10-CM | POA: Diagnosis present

## 2022-12-18 DIAGNOSIS — T45515A Adverse effect of anticoagulants, initial encounter: Secondary | ICD-10-CM | POA: Diagnosis present

## 2022-12-18 DIAGNOSIS — T83518A Infection and inflammatory reaction due to other urinary catheter, initial encounter: Principal | ICD-10-CM | POA: Diagnosis present

## 2022-12-18 DIAGNOSIS — Z79899 Other long term (current) drug therapy: Secondary | ICD-10-CM

## 2022-12-18 DIAGNOSIS — I110 Hypertensive heart disease with heart failure: Secondary | ICD-10-CM | POA: Diagnosis present

## 2022-12-18 DIAGNOSIS — C9110 Chronic lymphocytic leukemia of B-cell type not having achieved remission: Secondary | ICD-10-CM | POA: Diagnosis not present

## 2022-12-18 DIAGNOSIS — Z881 Allergy status to other antibiotic agents status: Secondary | ICD-10-CM

## 2022-12-18 DIAGNOSIS — E1151 Type 2 diabetes mellitus with diabetic peripheral angiopathy without gangrene: Secondary | ICD-10-CM | POA: Diagnosis present

## 2022-12-18 DIAGNOSIS — Z7984 Long term (current) use of oral hypoglycemic drugs: Secondary | ICD-10-CM

## 2022-12-18 DIAGNOSIS — Z86010 Personal history of colonic polyps: Secondary | ICD-10-CM

## 2022-12-18 DIAGNOSIS — Y846 Urinary catheterization as the cause of abnormal reaction of the patient, or of later complication, without mention of misadventure at the time of the procedure: Secondary | ICD-10-CM | POA: Diagnosis present

## 2022-12-18 DIAGNOSIS — R42 Dizziness and giddiness: Secondary | ICD-10-CM | POA: Diagnosis not present

## 2022-12-18 DIAGNOSIS — I25119 Atherosclerotic heart disease of native coronary artery with unspecified angina pectoris: Secondary | ICD-10-CM | POA: Diagnosis present

## 2022-12-18 DIAGNOSIS — R6520 Severe sepsis without septic shock: Secondary | ICD-10-CM

## 2022-12-18 DIAGNOSIS — E7800 Pure hypercholesterolemia, unspecified: Secondary | ICD-10-CM | POA: Diagnosis present

## 2022-12-18 DIAGNOSIS — Z833 Family history of diabetes mellitus: Secondary | ICD-10-CM

## 2022-12-18 DIAGNOSIS — N401 Enlarged prostate with lower urinary tract symptoms: Secondary | ICD-10-CM | POA: Diagnosis present

## 2022-12-18 DIAGNOSIS — Z951 Presence of aortocoronary bypass graft: Secondary | ICD-10-CM

## 2022-12-18 DIAGNOSIS — I495 Sick sinus syndrome: Secondary | ICD-10-CM | POA: Diagnosis present

## 2022-12-18 DIAGNOSIS — Z85828 Personal history of other malignant neoplasm of skin: Secondary | ICD-10-CM

## 2022-12-18 DIAGNOSIS — I214 Non-ST elevation (NSTEMI) myocardial infarction: Secondary | ICD-10-CM | POA: Diagnosis present

## 2022-12-18 DIAGNOSIS — Z85820 Personal history of malignant melanoma of skin: Secondary | ICD-10-CM

## 2022-12-18 DIAGNOSIS — Z950 Presence of cardiac pacemaker: Secondary | ICD-10-CM | POA: Diagnosis not present

## 2022-12-18 DIAGNOSIS — E872 Acidosis, unspecified: Secondary | ICD-10-CM | POA: Diagnosis present

## 2022-12-18 DIAGNOSIS — Z5181 Encounter for therapeutic drug level monitoring: Secondary | ICD-10-CM | POA: Diagnosis not present

## 2022-12-18 DIAGNOSIS — Z7901 Long term (current) use of anticoagulants: Secondary | ICD-10-CM

## 2022-12-18 DIAGNOSIS — Z886 Allergy status to analgesic agent status: Secondary | ICD-10-CM

## 2022-12-18 DIAGNOSIS — Z87891 Personal history of nicotine dependence: Secondary | ICD-10-CM

## 2022-12-18 DIAGNOSIS — R338 Other retention of urine: Secondary | ICD-10-CM | POA: Diagnosis not present

## 2022-12-18 DIAGNOSIS — N281 Cyst of kidney, acquired: Secondary | ICD-10-CM | POA: Diagnosis not present

## 2022-12-18 DIAGNOSIS — Z955 Presence of coronary angioplasty implant and graft: Secondary | ICD-10-CM

## 2022-12-18 HISTORY — DX: Disorder of kidney and ureter, unspecified: N28.9

## 2022-12-18 LAB — COMPREHENSIVE METABOLIC PANEL
ALT: 15 U/L (ref 0–44)
AST: 17 U/L (ref 15–41)
Albumin: 3.1 g/dL — ABNORMAL LOW (ref 3.5–5.0)
Alkaline Phosphatase: 51 U/L (ref 38–126)
Anion gap: 12 (ref 5–15)
BUN: 21 mg/dL (ref 8–23)
CO2: 17 mmol/L — ABNORMAL LOW (ref 22–32)
Calcium: 8.2 mg/dL — ABNORMAL LOW (ref 8.9–10.3)
Chloride: 110 mmol/L (ref 98–111)
Creatinine, Ser: 1.25 mg/dL — ABNORMAL HIGH (ref 0.61–1.24)
GFR, Estimated: 57 mL/min — ABNORMAL LOW (ref >60)
Glucose, Bld: 101 mg/dL — ABNORMAL HIGH (ref 70–99)
Potassium: 5 mmol/L (ref 3.5–5.1)
Sodium: 139 mmol/L (ref 135–145)
Total Bilirubin: 0.3 mg/dL (ref 0.3–1.2)
Total Protein: 5.6 g/dL — ABNORMAL LOW (ref 6.5–8.1)

## 2022-12-18 LAB — TROPONIN I (HIGH SENSITIVITY)
Troponin I (High Sensitivity): 8 ng/L (ref <18)
Troponin I (High Sensitivity): 8 ng/L (ref <18)

## 2022-12-18 LAB — APTT: aPTT: 39 s — ABNORMAL HIGH (ref 24–36)

## 2022-12-18 LAB — I-STAT CG4 LACTIC ACID, ED
Lactic Acid, Venous: 3.3 mmol/L (ref 0.5–1.9)
Lactic Acid, Venous: 3.1 mmol/L (ref 0.5–1.9)

## 2022-12-18 LAB — CBC WITH DIFFERENTIAL/PLATELET
Abs Immature Granulocytes: 0 10*3/uL (ref 0.00–0.07)
Basophils Absolute: 0 10*3/uL (ref 0.0–0.1)
Basophils Relative: 0 %
Eosinophils Absolute: 0 10*3/uL (ref 0.0–0.5)
Eosinophils Relative: 0 %
HCT: 32.7 % — ABNORMAL LOW (ref 39.0–52.0)
Hemoglobin: 9.3 g/dL — ABNORMAL LOW (ref 13.0–17.0)
Lymphocytes Relative: 46 %
Lymphs Abs: 10.7 10*3/uL — ABNORMAL HIGH (ref 0.7–4.0)
MCH: 24 pg — ABNORMAL LOW (ref 26.0–34.0)
MCHC: 28.4 g/dL — ABNORMAL LOW (ref 30.0–36.0)
MCV: 84.3 fL (ref 80.0–100.0)
Monocytes Absolute: 0.2 10*3/uL (ref 0.1–1.0)
Monocytes Relative: 1 %
Neutro Abs: 12.3 10*3/uL — ABNORMAL HIGH (ref 1.7–7.7)
Neutrophils Relative %: 53 %
Platelets: 180 10*3/uL (ref 150–400)
RBC: 3.88 MIL/uL — ABNORMAL LOW (ref 4.22–5.81)
RDW: 23.1 % — ABNORMAL HIGH (ref 11.5–15.5)
WBC: 23.2 10*3/uL — ABNORMAL HIGH (ref 4.0–10.5)
nRBC: 0 % (ref 0.0–0.2)
nRBC: 0 /100{WBCs}

## 2022-12-18 LAB — PROTIME-INR
INR: 2.8 — ABNORMAL HIGH (ref 0.8–1.2)
Prothrombin Time: 29.4 s — ABNORMAL HIGH (ref 11.4–15.2)

## 2022-12-18 LAB — CBG MONITORING, ED: Glucose-Capillary: 94 mg/dL (ref 70–99)

## 2022-12-18 LAB — URINALYSIS, W/ REFLEX TO CULTURE (INFECTION SUSPECTED)
RBC / HPF: >50 RBC/hpf (ref 0–5)
Squamous Epithelial / HPF: NONE SEEN /HPF (ref 0–5)
WBC, UA: >50 WBC/hpf (ref 0–5)

## 2022-12-18 LAB — HEMOGLOBIN AND HEMATOCRIT, BLOOD
HCT: 29.1 % — ABNORMAL LOW (ref 39.0–52.0)
HCT: 26.7 % — ABNORMAL LOW (ref 39.0–52.0)
Hemoglobin: 8.3 g/dL — ABNORMAL LOW (ref 13.0–17.0)
Hemoglobin: 7.7 g/dL — ABNORMAL LOW (ref 13.0–17.0)

## 2022-12-18 LAB — POC OCCULT BLOOD, ED: Fecal Occult Bld: NEGATIVE

## 2022-12-18 LAB — GLUCOSE, CAPILLARY: Glucose-Capillary: 136 mg/dL — ABNORMAL HIGH (ref 70–99)

## 2022-12-18 MED ORDER — SODIUM CHLORIDE 0.9 % IV SOLN
1.0000 g | Freq: Once | INTRAVENOUS | Status: AC
  Administered 2022-12-18: 1 g via INTRAVENOUS
  Filled 2022-12-18: qty 10

## 2022-12-18 MED ORDER — EMPAGLIFLOZIN 10 MG PO TABS
10.0000 mg | ORAL_TABLET | Freq: Every day | ORAL | Status: DC
  Administered 2022-12-19 – 2022-12-23 (×5): 10 mg via ORAL
  Filled 2022-12-18 (×5): qty 1

## 2022-12-18 MED ORDER — SODIUM CHLORIDE 0.9 % IR SOLN
3000.0000 mL | Status: DC
  Administered 2022-12-18 – 2022-12-20 (×5): 3000 mL

## 2022-12-18 MED ORDER — LACTATED RINGERS IV BOLUS
1000.0000 mL | Freq: Once | INTRAVENOUS | Status: AC
  Administered 2022-12-18: 1000 mL via INTRAVENOUS

## 2022-12-18 MED ORDER — ALBUTEROL SULFATE (2.5 MG/3ML) 0.083% IN NEBU: 2.5000 mg | INHALATION_SOLUTION | Freq: Four times a day (QID) | RESPIRATORY_TRACT | Status: DC | PRN

## 2022-12-18 MED ORDER — TAMSULOSIN HCL 0.4 MG PO CAPS
0.4000 mg | ORAL_CAPSULE | Freq: Two times a day (BID) | ORAL | Status: DC
  Administered 2022-12-19 – 2022-12-23 (×9): 0.4 mg via ORAL
  Filled 2022-12-18 (×9): qty 1

## 2022-12-18 MED ORDER — INSULIN ASPART 100 UNIT/ML IJ SOLN
0.0000 [IU] | Freq: Three times a day (TID) | INTRAMUSCULAR | Status: DC
  Administered 2022-12-19: 2 [IU] via SUBCUTANEOUS
  Administered 2022-12-19 – 2022-12-20 (×3): 1 [IU] via SUBCUTANEOUS
  Administered 2022-12-20: 3 [IU] via SUBCUTANEOUS
  Administered 2022-12-21: 2 [IU] via SUBCUTANEOUS
  Administered 2022-12-22: 1 [IU] via SUBCUTANEOUS
  Administered 2022-12-22 (×2): 2 [IU] via SUBCUTANEOUS
  Administered 2022-12-23: 3 [IU] via SUBCUTANEOUS

## 2022-12-18 MED ORDER — SODIUM CHLORIDE 0.9 % IV SOLN
2.0000 g | INTRAVENOUS | Status: DC
  Administered 2022-12-19 – 2022-12-22 (×4): 2 g via INTRAVENOUS
  Filled 2022-12-18 (×4): qty 20

## 2022-12-18 MED ORDER — MELATONIN 5 MG PO TABS
5.0000 mg | ORAL_TABLET | Freq: Every day | ORAL | Status: DC
  Administered 2022-12-18 – 2022-12-22 (×5): 5 mg via ORAL
  Filled 2022-12-18 (×5): qty 1

## 2022-12-18 MED ORDER — FINASTERIDE 5 MG PO TABS
5.0000 mg | ORAL_TABLET | Freq: Every day | ORAL | Status: DC
  Administered 2022-12-19 – 2022-12-23 (×5): 5 mg via ORAL
  Filled 2022-12-18 (×5): qty 1

## 2022-12-18 MED ORDER — SODIUM CHLORIDE 0.9 % IV SOLN
250.0000 mg | Freq: Every day | INTRAVENOUS | Status: AC
  Administered 2022-12-18 – 2022-12-19 (×2): 250 mg via INTRAVENOUS
  Filled 2022-12-18 (×2): qty 20

## 2022-12-18 MED ORDER — ACETAMINOPHEN 650 MG RE SUPP: 650.0000 mg | Freq: Four times a day (QID) | RECTAL | Status: DC | PRN

## 2022-12-18 MED ORDER — SODIUM CHLORIDE 0.9 % IV SOLN: INTRAVENOUS | Status: DC

## 2022-12-18 MED ORDER — SODIUM CHLORIDE 0.9% FLUSH
3.0000 mL | Freq: Two times a day (BID) | INTRAVENOUS | Status: DC
  Administered 2022-12-18 – 2022-12-23 (×10): 3 mL via INTRAVENOUS

## 2022-12-18 MED ORDER — HYDROCODONE-ACETAMINOPHEN 5-325 MG PO TABS: 1.0000 | ORAL_TABLET | Freq: Four times a day (QID) | ORAL | Status: DC | PRN

## 2022-12-18 MED ORDER — ATORVASTATIN CALCIUM 80 MG PO TABS
80.0000 mg | ORAL_TABLET | Freq: Every day | ORAL | Status: DC
  Administered 2022-12-19 – 2022-12-22 (×4): 80 mg via ORAL
  Filled 2022-12-18 (×4): qty 1

## 2022-12-18 MED ORDER — ACETAMINOPHEN 325 MG PO TABS: 650.0000 mg | ORAL_TABLET | Freq: Four times a day (QID) | ORAL | Status: DC | PRN

## 2022-12-18 NOTE — ED Notes (Signed)
ED TO INPATIENT HANDOFF REPORT  ED Nurse Name and Phone #: Gustavo Lah, rn [phone]  S Name/Age/Gender Robert Burns 82 y.o. male Room/Bed: 029C/029C  Code Status   Code Status: Full Code  Home/SNF/Other Home Patient oriented to: self, place, time, and situation Is this baseline? Yes   Triage Complete: Triage complete  Chief Complaint Urinary tract infection with hematuria [N39.0, R31.9]  Triage Note Pt c.o dizziness for the past 2 days and blood in his foley catheter for the past 4 days. Pt pale and hypotensive in triage.   Allergies Allergies  Allergen Reactions   Peanut-Containing Drug Products Anaphylaxis and Other (See Comments)    Tongue swelling is severe    Ace Inhibitors Cough        Diltiazem Hcl Other (See Comments)    UNSPECIFIED REACTION     Meloxicam Other (See Comments)    GI upset    Doxycycline Other (See Comments) and Cough    Reaction of cough and runny nose   Eliquis [Apixaban] Other (See Comments)    dizziness   Sertraline Hcl Other (See Comments)   Carvedilol Itching   Clonidine Hcl Other (See Comments)    Patch only - skin irritation   Duloxetine Hcl Other (See Comments)    Urinary frequency    Level of Care/Admitting Diagnosis ED Disposition     ED Disposition  Admit   Condition  --   Comment  Hospital Area: [facility] [[id number]]  Level of Care: Progressive [102]  Admit to Progressive based on following criteria: NEPHROLOGY stable condition requiring close monitoring for AKI, requiring Hemodialysis or Peritoneal Dialysis either from expected electrolyte imbalance, acidosis, or fluid overload that can be managed by NIPPV or high flow oxygen.  May admit patient to [facility] or [facility] if equivalent level of care is available:: No  Covid Evaluation: Asymptomatic - no recent exposure (last 10 days) testing not required  Diagnosis: Urinary tract infection with hematuria [[id number]]  Admitting Physician: Clydie Braun [[id number]]  Attending Physician: Clydie Braun [[id number]]  Certification:: I certify this patient will need inpatient services for at least 2 midnights  Expected Medical Readiness: 12/20/2022          B Medical/Surgery History Past Medical History:  Diagnosis Date   Anemia    Anxiety    Arthritis    "all over" (11/25/2015)   CAD (coronary artery disease)    a. s/p CABG  06/2002; b. 02/01/15 PCI: DES to prox SVG to PDA, staged PCI of SVG to Diag in 02/2015; c. 04/2017 Cath/PCI: LM nl, LAD 100ost, 82m, 75d, LCX 60ost, OM2 80, RCA 100ost, RPDA 80, LIMA->LAD ok, VG->D1 patent stent, VG->RPDA patent stent, 50p, VG->OM1->OM2 90p (3.0x24 Synergy DES), 100 between OM1->OM2 (med rx).   Cancer [facility])    Right Shoulder, Left Leg- BCC, SCC, AND MELANOMA   Colon polyp    Elevated cholesterol    Epithelioid hemangioendothelioma    s/p resection of left SFA/mass with interposition 6 mm GoreTex graft 06/02/10, s/p repeat resection for positive margins 09/22/10   High blood pressure    Hyperlipidemia    Hypertension    Leg pain    OSA on CPAP    PAD (peripheral artery disease) (HCC)    a. 10/2002 L SFA PTA/BMS; b. 8/17 LE Angio: LEIA 90 (9x40 self exp stent), LSFA short segment prox occlusion (staged PTA/stenting 01/03/2016), patent mid stent, RSFA 72m (staged PTA/DEB 02/14/2016).   Presence of permanent  cardiac pacemaker    Medtronic   SSS (sick sinus syndrome) (HCC)    a. s/p PPM in 2007 with gen change 04/2015 - Medtronic Adapta ADDRL1, ser # [id number] H.   Type II diabetes mellitus (HCC)    Type II   Urgency of urination    Past Surgical History:  Procedure Laterality Date   CARDIAC CATHETERIZATION N/A 02/01/2015   Procedure: Left Heart Cath and Coronary Angiography;  Surgeon: Runell Gess, MD;  Location: [facility] INVASIVE CV LAB;  Service: Cardiovascular;  Laterality: N/A;   CARDIAC CATHETERIZATION N/A 02/01/2015   Procedure: Coronary Stent Intervention;  Surgeon: Runell Gess, MD;   Location: [facility] INVASIVE CV LAB;  Service: Cardiovascular;  Laterality: N/A;   CARDIAC CATHETERIZATION  06/2002   "just before bypass OR"   CARDIAC CATHETERIZATION N/A 03/01/2015   Procedure: Coronary Stent Intervention;  Surgeon: Runell Gess, MD;  Location: [facility] INVASIVE CV LAB;  Service: Cardiovascular;  Laterality: N/A;   CARDIAC CATHETERIZATION  02/06/2018   COLONOSCOPY     CORONARY ANGIOPLASTY     CORONARY ARTERY BYPASS GRAFT  06/2002   x5, LIMA-LAD;VG- Diag; seq VG- ramus & OM branch; VG-PDA   CORONARY STENT INTERVENTION N/A 04/12/2017   Procedure: CORONARY STENT INTERVENTION;  Surgeon: Runell Gess, MD;  Location: [facility] INVASIVE CV LAB;  Service: Cardiovascular;  Laterality: N/A;   CORONARY STENT INTERVENTION N/A 02/08/2018   Procedure: CORONARY STENT INTERVENTION;  Surgeon: Corky Crafts, MD;  Location: [facility] INVASIVE CV LAB;  Service: Cardiovascular;  Laterality: N/A;   CORONARY STENT INTERVENTION N/A 11/12/2018   Procedure: CORONARY STENT INTERVENTION;  Surgeon: Iran Ouch, MD;  Location: [facility] INVASIVE CV LAB;  Service: Cardiovascular;  Laterality: N/A;   CORONARY STENT INTERVENTION N/A 06/22/2020   Procedure: CORONARY STENT INTERVENTION;  Surgeon: Corky Crafts, MD;  Location: [facility] INVASIVE CV LAB;  Service: Cardiovascular;  Laterality: N/A;   EP IMPLANTABLE DEVICE N/A 04/23/2015   Procedure: PPM Generator Changeout;  Surgeon: Thurmon Fair, MD;  Location: [facility] INVASIVE CV LAB;  Service: Cardiovascular;  Laterality: N/A;   FALSE ANEURYSM REPAIR Left 11/29/2018   Procedure: REPAIR FALSE ANEURYSM LEFT RADIAL ARTERY;  Surgeon: Larina Earthly, MD;  Location: [facility] OR;  Service: Vascular;  Laterality: Left;   FEMORAL ARTERY STENT Left ~ 2014   "taken out of my leg; couldn' catorgorize what kind so the put it under all 3"; cataroziepitheloid hemanioendotheliomau   FOOT FRACTURE SURGERY Left 1973   FRACTURE SURGERY     INSERT / REPLACE / REMOVE PACEMAKER  10/13/05   right side,  medtronic Adapta   KNEE HARDWARE REMOVAL Right 1950's   "3-4 months after the insertion"   KNEE SURGERY Right 1950's   "broke my lower leg; had to put pin in my knee to keep lower leg in place til it healed"   LEFT HEART CATH AND CORS/GRAFTS ANGIOGRAPHY N/A 04/12/2017   Procedure: LEFT HEART CATH AND CORS/GRAFTS ANGIOGRAPHY;  Surgeon: Runell Gess, MD;  Location: [facility] INVASIVE CV LAB;  Service: Cardiovascular;  Laterality: N/A;   LEFT HEART CATH AND CORS/GRAFTS ANGIOGRAPHY N/A 02/06/2018   Procedure: LEFT HEART CATH AND CORS/GRAFTS ANGIOGRAPHY;  Surgeon: Lennette Bihari, MD;  Location: [facility] INVASIVE CV LAB;  Service: Cardiovascular;  Laterality: N/A;   LEFT HEART CATH AND CORS/GRAFTS ANGIOGRAPHY N/A 11/12/2018   Procedure: LEFT HEART CATH AND CORS/GRAFTS ANGIOGRAPHY;  Surgeon: Iran Ouch, MD;  Location: [facility] INVASIVE CV LAB;  Service: Cardiovascular;  Laterality: N/A;  LEFT HEART CATH AND CORS/GRAFTS ANGIOGRAPHY N/A 06/22/2020   Procedure: LEFT HEART CATH AND CORS/GRAFTS ANGIOGRAPHY;  Surgeon: Corky Crafts, MD;  Location: [facility] INVASIVE CV LAB;  Service: Cardiovascular;  Laterality: N/A;   LEFT HEART CATH AND CORS/GRAFTS ANGIOGRAPHY N/A 12/01/2022   Procedure: LEFT HEART CATH AND CORS/GRAFTS ANGIOGRAPHY;  Surgeon: Swaziland, Peter M, MD;  Location: [facility] INVASIVE CV LAB;  Service: Cardiovascular;  Laterality: N/A;   PERIPHERAL VASCULAR CATHETERIZATION N/A 11/25/2015   Procedure: Lower Extremity Angiography;  Surgeon: Runell Gess, MD;  Location: [facility] INVASIVE CV LAB;  Service: Cardiovascular;  Laterality: N/A;   PERIPHERAL VASCULAR CATHETERIZATION Left 11/25/2015   Procedure: Peripheral Vascular Intervention;  Surgeon: Runell Gess, MD;  Location: [facility] INVASIVE CV LAB;  Service: Cardiovascular;  Laterality: Left;  external iliac   PERIPHERAL VASCULAR CATHETERIZATION N/A 01/03/2016   Procedure: Lower Extremity Angiography;  Surgeon: Runell Gess, MD;  Location: [facility] INVASIVE CV LAB;  Service:  Cardiovascular;  Laterality: N/A;   PERIPHERAL VASCULAR CATHETERIZATION Left 01/03/2016   Procedure: Peripheral Vascular Intervention;  Surgeon: Runell Gess, MD;  Location: [facility] INVASIVE CV LAB;  Service: Cardiovascular;  Laterality: Left;  SFA   PERIPHERAL VASCULAR CATHETERIZATION Right 02/14/2016   Procedure: Peripheral Vascular Atherectomy;  Surgeon: Runell Gess, MD;  Location: [facility] INVASIVE CV LAB;  Service: Cardiovascular;  Laterality: Right;  SFA   POPLITEAL ARTERY STENT  01/03/2016   Contralateral access with a 7 [name] crossover sheath (second order catheter placement)   TONSILLECTOMY AND ADENOIDECTOMY     TUMOR EXCISION Right ~ 2005   cancerous tumor removed from shoulder   TUMOR EXCISION Right ~ 2000   benign tumor removed from under shoulder   TUMOR EXCISION Left 06/02/2010   resection of Lt SFA wth interposition of Gore-Tex graft     A IV Location/Drains/Wounds Patient Lines/Drains/Airways Status     Active Line/Drains/Airways     Name Placement date Placement time Site Days   Peripheral IV 12/18/22 18 G Left Forearm 12/18/22  --  Forearm  less than 1   Peripheral IV 12/18/22 20 G Anterior;Right Forearm 12/18/22  1300  Forearm  less than 1   Urethral Catheter Alonza Knisley RN Triple-lumen 18 Fr. 12/18/22  1309  Triple-lumen  less than 1            Intake/Output Last 24 hours  Intake/Output Summary (Last 24 hours) at 12/18/2022 1619 Last data filed at 12/18/2022 1619 Gross per 24 hour  Intake 1100 ml  Output --  Net 1100 ml    Labs/Imaging Results for orders placed or performed during the hospital encounter of 12/18/22 (from the past 48 hour(s))  CBG monitoring, ED     Status: None   Collection Time: 12/18/22 11:23 AM  Result Value Ref Range   Glucose-Capillary 94 70 - 99 mg/dL    Comment: Glucose reference range applies only to samples taken after fasting for at least 8 hours.  Comprehensive metabolic panel     Status: Abnormal   Collection Time: 12/18/22 11:28  AM  Result Value Ref Range   Sodium 139 135 - 145 mmol/L   Potassium 5.0 3.5 - 5.1 mmol/L   Chloride 110 98 - 111 mmol/L   CO2 17 (L) 22 - 32 mmol/L   Glucose, Bld 101 (H) 70 - 99 mg/dL    Comment: Glucose reference range applies only to samples taken after fasting for at least 8 hours.   BUN 21 8 - 23 mg/dL  Creatinine, Ser 1.25 (H) 0.61 - 1.24 mg/dL   Calcium 8.2 (L) 8.9 - 10.3 mg/dL   Total Protein 5.6 (L) 6.5 - 8.1 g/dL   Albumin 3.1 (L) 3.5 - 5.0 g/dL   AST 17 15 - 41 U/L   ALT 15 0 - 44 U/L   Alkaline Phosphatase 51 38 - 126 U/L   Total Bilirubin 0.3 0.3 - 1.2 mg/dL   GFR, Estimated 57 (L) >60 mL/min    Comment: (NOTE) Calculated using the CKD-EPI Creatinine Equation (2021)    Anion gap 12 5 - 15    Comment: Performed at [facility], [address], [place], [place] [address]  CBC with Differential     Status: Abnormal   Collection Time: 12/18/22 11:28 AM  Result Value Ref Range   WBC 23.2 (H) 4.0 - 10.5 K/uL   RBC 3.88 (L) 4.22 - 5.81 MIL/uL   Hemoglobin 9.3 (L) 13.0 - 17.0 g/dL   HCT 29.5 (L) 18.8 - 41.6 %   MCV 84.3 80.0 - 100.0 fL   MCH 24.0 (L) 26.0 - 34.0 pg   MCHC 28.4 (L) 30.0 - 36.0 g/dL   RDW 60.6 (H) 30.1 - 60.1 %   Platelets 180 150 - 400 K/uL   nRBC 0.0 0.0 - 0.2 %   Neutrophils Relative % 53 %   Neutro Abs 12.3 (H) 1.7 - 7.7 K/uL   Lymphocytes Relative 46 %   Lymphs Abs 10.7 (H) 0.7 - 4.0 K/uL   Monocytes Relative 1 %   Monocytes Absolute 0.2 0.1 - 1.0 K/uL   Eosinophils Relative 0 %   Eosinophils Absolute 0.0 0.0 - 0.5 K/uL   Basophils Relative 0 %   Basophils Absolute 0.0 0.0 - 0.1 K/uL   nRBC 0 0 /100 WBC   Abs Immature Granulocytes 0.00 0.00 - 0.07 K/uL   Acanthocytes PRESENT    Polychromasia PRESENT     Comment: Performed at [facility], [address], [place], [place] [address]  Protime-INR     Status: Abnormal   Collection Time: 12/18/22 11:28 AM  Result Value Ref Range   Prothrombin Time 29.4 (H) 11.4 - 15.2 seconds    INR 2.8 (H) 0.8 - 1.2    Comment: (NOTE) INR goal varies based on device and disease states. Performed at [facility], [address] [place], [place] [address]   APTT     Status: Abnormal   Collection Time: 12/18/22 11:28 AM  Result Value Ref Range   aPTT 39 (H) 24 - 36 seconds    Comment:        IF BASELINE aPTT IS ELEVATED, SUGGEST PATIENT RISK ASSESSMENT BE USED TO DETERMINE APPROPRIATE ANTICOAGULANT THERAPY. Performed at [facility], [address], [place], [place] [address]   Urinalysis, w/ Reflex to Culture (Infection Suspected) -Urine, Catheterized     Status: Abnormal   Collection Time: 12/18/22 11:28 AM  Result Value Ref Range   Specimen Source URINE, CATHETERIZED    Color, Urine RED (A) YELLOW    Comment: BIOCHEMICALS MAY BE AFFECTED BY COLOR CORRECTED ON 09/09 AT 1452: PREVIOUSLY REPORTED AS RED    APPearance TURBID (A) CLEAR   Specific Gravity, Urine  1.005 - 1.030    TEST NOT REPORTED DUE TO COLOR INTERFERENCE OF URINE PIGMENT   pH  5.0 - 8.0    TEST NOT REPORTED DUE TO COLOR INTERFERENCE OF URINE PIGMENT   Glucose, UA (A) NEGATIVE  mg/dL    TEST NOT REPORTED DUE TO COLOR INTERFERENCE OF URINE PIGMENT   Hgb urine dipstick (A) NEGATIVE    TEST NOT REPORTED DUE TO COLOR INTERFERENCE OF URINE PIGMENT   Bilirubin Urine (A) NEGATIVE    TEST NOT REPORTED DUE TO COLOR INTERFERENCE OF URINE PIGMENT   Ketones, ur (A) NEGATIVE mg/dL    TEST NOT REPORTED DUE TO COLOR INTERFERENCE OF URINE PIGMENT   Protein, ur (A) NEGATIVE mg/dL    TEST NOT REPORTED DUE TO COLOR INTERFERENCE OF URINE PIGMENT   Nitrite (A) NEGATIVE    TEST NOT REPORTED DUE TO COLOR INTERFERENCE OF URINE PIGMENT   Leukocytes,Ua (A) NEGATIVE    TEST NOT REPORTED DUE TO COLOR INTERFERENCE OF URINE PIGMENT   Squamous Epithelial / HPF NONE SEEN 0 - 5 /HPF   WBC, UA >50 0 - 5 WBC/hpf    Comment: Reflex urine culture not performed if WBC <=10, OR if Squamous epithelial cells >5. If Squamous  epithelial cells >5, suggest recollection.   RBC / HPF >50 0 - 5 RBC/hpf   Bacteria, UA MANY (A) NONE SEEN   Mucus PRESENT     Comment: Performed at [facility], [address], [place], [place] [address]  Troponin I (High Sensitivity)     Status: None   Collection Time: 12/18/22 11:28 AM  Result Value Ref Range   Troponin I (High Sensitivity) 8 <18 ng/L    Comment: (NOTE) Elevated high sensitivity troponin I (hsTnI) values and significant  changes across serial measurements may suggest ACS but many other  chronic and acute conditions are known to elevate hsTnI results.  Refer to the "Links" section for chest pain algorithms and additional  guidance. Performed at [facility], [address], [place], [place] [address]   POC occult blood, ED     Status: None   Collection Time: 12/18/22 11:36 AM  Result Value Ref Range   Fecal Occult Bld NEGATIVE NEGATIVE  I-Stat Lactic Acid, ED     Status: Abnormal   Collection Time: 12/18/22 11:48 AM  Result Value Ref Range   Lactic Acid, Venous 3.3 (HH) 0.5 - 1.9 mmol/L   Comment NOTIFIED PHYSICIAN   Troponin I (High Sensitivity)     Status: None   Collection Time: 12/18/22  1:28 PM  Result Value Ref Range   Troponin I (High Sensitivity) 8 <18 ng/L    Comment: (NOTE) Elevated high sensitivity troponin I (hsTnI) values and significant  changes across serial measurements may suggest ACS but many other  chronic and acute conditions are known to elevate hsTnI results.  Refer to the "Links" section for chest pain algorithms and additional  guidance. Performed at [facility], [address] [place], [place] [address]   I-Stat Lactic Acid, ED     Status: Abnormal   Collection Time: 12/18/22  1:46 PM  Result Value Ref Range   Lactic Acid, Venous 3.1 (HH) 0.5 - 1.9 mmol/L   Comment NOTIFIED PHYSICIAN    DG Chest Port 1 View  Result Date: 12/18/2022 CLINICAL DATA:  Dizziness. Hematuria. Possible sepsis. Hypotension. Short  of breath. EXAM: PORTABLE CHEST 1 VIEW COMPARISON:  11/30/2022 FINDINGS: Previous median sternotomy and CABG. Dual lead pacemaker with leads in the region of the right atrium and right ventricle. The pulmonary vascularity is normal. The lungs are clear. No visible effusion. No acute bone finding. IMPRESSION: No active disease. Previous CABG. Dual lead pacemaker. Electronically Signed  By: Paulina Fusi M.D.   On: 12/18/2022 12:47    Pending Labs Unresulted Labs (From admission, onward)     Start     Ordered   12/19/22 0500  CBC  Tomorrow morning,   R        12/18/22 1531   12/19/22 0500  Basic metabolic panel  Tomorrow morning,   R        12/18/22 1531   12/18/22 1600  Hemoglobin and hematocrit, blood  Once,   R        12/18/22 1531   12/18/22 1128  Blood Culture (routine x 2)  (Undifferentiated presentation (screening labs and basic nursing orders))  BLOOD CULTURE X 2,   STAT      12/18/22 1128   12/18/22 1128  Urine Culture  Once,   R        12/18/22 1128            Vitals/Pain Today's Vitals   12/18/22 1124 12/18/22 1135 12/18/22 1259 12/18/22 1500  BP:  95/60 124/60 120/60  Pulse:  64 63 61  Resp:  15 18 (!) 22  Temp:      TempSrc:      SpO2:  100% 100% 100%  PainSc: 0-No pain       Isolation Precautions No active isolations  Medications Medications  sodium chloride irrigation 0.9 % 3,000 mL (0 mLs Irrigation Stopped 12/18/22 1455)  sodium chloride flush (NS) 0.9 % injection 3 mL (3 mLs Intravenous Given 12/18/22 1551)  0.9 %  sodium chloride infusion ( Intravenous New Bag/Given 12/18/22 1618)  acetaminophen (TYLENOL) tablet 650 mg (has no administration in time range)    Or  acetaminophen (TYLENOL) suppository 650 mg (has no administration in time range)  albuterol (PROVENTIL) (2.5 MG/3ML) 0.083% nebulizer solution 2.5 mg (has no administration in time range)  cefTRIAXone (ROCEPHIN) 2 g in sodium chloride 0.9 % 100 mL IVPB (has no administration in time range)   cefTRIAXone (ROCEPHIN) 1 g in sodium chloride 0.9 % 100 mL IVPB (1 g Intravenous New Bag/Given 12/18/22 1618)  lactated ringers bolus 1,000 mL (0 mLs Intravenous Stopped 12/18/22 1334)  cefTRIAXone (ROCEPHIN) 1 g in sodium chloride 0.9 % 100 mL IVPB (0 g Intravenous Stopped 12/18/22 1619)    Mobility Usually walks, cannot now due to CBI     Focused Assessments Cardiac Assessment Handoff:    Lab Results  Component Value Date   TROPONINI 4.76 (HH) 04/14/2017   No results found for: "DDIMER" Does the Patient currently have chest pain? No    R Recommendations: See Admitting Provider Note  Report given to:   Additional Notes: continuous bladder irrigation

## 2022-12-18 NOTE — ED Notes (Signed)
Bag of irrigation fluid started

## 2022-12-18 NOTE — Progress Notes (Signed)
This nurse started of CBI fluid.

## 2022-12-18 NOTE — ED Notes (Signed)
Got patient ito a gown on the monitor patient is resting with nurse and family at bedside

## 2022-12-18 NOTE — ED Notes (Signed)
Second bag irrigation fluid bag hung

## 2022-12-18 NOTE — H&P (Signed)
History and Physical    Patient: Robert Burns GNF:[id number] DOB: 09-16-1940 DOA: 12/18/2022 DOS: the patient was seen and examined on 12/18/2022 PCP: Thana Ates, MD  Patient coming from: Home  Chief Complaint:  Chief Complaint  Patient presents with   Dizziness   Hematuria   HPI: Robert Burns is a 82 y.o. male with medical history significant of hypertension, hyperlipidemia, CAD s/p CABG with multiple PCI's, PAF, sick sinus syndrome s/p pacemaker, CLL, and BPH with urinary retention who presents with complaints of blood in urine.  He had just recently been hospitalized 8/22-8/24 with NSTEMI high-sensitivity troponins elevated up to 931.  Left heart cath showed occluded SVG to RCA, but was unamenable to intervention and medical management was recommended.  Subsequently patient developed urinary retention was hospitalized at [facility] 8/29-9/3 for urinary retention.  Patient had Foley catheter placed and was noted to have blood present in his urine.  He had been continued on Xarelto and Plavix.  Patient's son notes that he has not had blood present in his urine since leaving the house.  Patient had been complaining of lightheadedness since leaving home.  He had taken his medications including Xarelto and Plavix.  He had gone to follow-up appointment today and was noted to have an episode of staring off and would like he was going to pass out.  Denies any recent fevers at home.  In the emergency department patient was noted to be afebrile with respirations 15-22, blood pressures reported to be as low as 47/22 with improvement after also 1 L of IV fluids.  Labs significant for WBC 23.2 hemoglobin 9.3, BUN 21, creatinine 1.25, glucose 101, lactic acid 3.3->3.1.  Urinalysis was noted to be red in color with many bacteria present, greater than 50 RBCs, and greater than 50 WBCs.  Blood and urine cultures have been obtained.  Patient's Foley catheter was exchanged out for three-way bladder  irrigation.  Patient was given 1 L normal saline IV fluids and started on Rocephin IV.   Review of Systems: As mentioned in the history of present illness. All other systems reviewed and are negative. Past Medical History:  Diagnosis Date   Anemia    Anxiety    Arthritis    "all over" (11/25/2015)   CAD (coronary artery disease)    a. s/p CABG  06/2002; b. 02/01/15 PCI: DES to prox SVG to PDA, staged PCI of SVG to Diag in 02/2015; c. 04/2017 Cath/PCI: LM nl, LAD 100ost, 38m, 75d, LCX 60ost, OM2 80, RCA 100ost, RPDA 80, LIMA->LAD ok, VG->D1 patent stent, VG->RPDA patent stent, 50p, VG->OM1->OM2 90p (3.0x24 Synergy DES), 100 between OM1->OM2 (med rx).   Cancer [facility])    Right Shoulder, Left Leg- BCC, SCC, AND MELANOMA   Colon polyp    Elevated cholesterol    Epithelioid hemangioendothelioma    s/p resection of left SFA/mass with interposition 6 mm GoreTex graft 06/02/10, s/p repeat resection for positive margins 09/22/10   High blood pressure    Hyperlipidemia    Hypertension    Leg pain    OSA on CPAP    PAD (peripheral artery disease) (HCC)    a. 10/2002 L SFA PTA/BMS; b. 8/17 LE Angio: LEIA 90 (9x40 self exp stent), LSFA short segment prox occlusion (staged PTA/stenting 01/03/2016), patent mid stent, RSFA 84m (staged PTA/DEB 02/14/2016).   Presence of permanent cardiac pacemaker    Medtronic   SSS (sick sinus syndrome) (HCC)    a. s/p PPM in 2007  with gen change 04/2015 - Medtronic Adapta ADDRL1, ser # [id number].   Type II diabetes mellitus (HCC)    Type II   Urgency of urination    Past Surgical History:  Procedure Laterality Date   CARDIAC CATHETERIZATION N/A 02/01/2015   Procedure: Left Heart Cath and Coronary Angiography;  Surgeon: Runell Gess, MD;  Location: [facility] INVASIVE CV LAB;  Service: Cardiovascular;  Laterality: N/A;   CARDIAC CATHETERIZATION N/A 02/01/2015   Procedure: Coronary Stent Intervention;  Surgeon: Runell Gess, MD;  Location: [facility] INVASIVE CV LAB;  Service:  Cardiovascular;  Laterality: N/A;   CARDIAC CATHETERIZATION  06/2002   "just before bypass OR"   CARDIAC CATHETERIZATION N/A 03/01/2015   Procedure: Coronary Stent Intervention;  Surgeon: Runell Gess, MD;  Location: [facility] INVASIVE CV LAB;  Service: Cardiovascular;  Laterality: N/A;   CARDIAC CATHETERIZATION  02/06/2018   COLONOSCOPY     CORONARY ANGIOPLASTY     CORONARY ARTERY BYPASS GRAFT  06/2002   x5, LIMA-LAD;VG- Diag; seq VG- ramus & OM branch; VG-PDA   CORONARY STENT INTERVENTION N/A 04/12/2017   Procedure: CORONARY STENT INTERVENTION;  Surgeon: Runell Gess, MD;  Location: [facility] INVASIVE CV LAB;  Service: Cardiovascular;  Laterality: N/A;   CORONARY STENT INTERVENTION N/A 02/08/2018   Procedure: CORONARY STENT INTERVENTION;  Surgeon: Corky Crafts, MD;  Location: [facility] INVASIVE CV LAB;  Service: Cardiovascular;  Laterality: N/A;   CORONARY STENT INTERVENTION N/A 11/12/2018   Procedure: CORONARY STENT INTERVENTION;  Surgeon: Iran Ouch, MD;  Location: [facility] INVASIVE CV LAB;  Service: Cardiovascular;  Laterality: N/A;   CORONARY STENT INTERVENTION N/A 06/22/2020   Procedure: CORONARY STENT INTERVENTION;  Surgeon: Corky Crafts, MD;  Location: [facility] INVASIVE CV LAB;  Service: Cardiovascular;  Laterality: N/A;   EP IMPLANTABLE DEVICE N/A 04/23/2015   Procedure: PPM Generator Changeout;  Surgeon: Thurmon Fair, MD;  Location: [facility] INVASIVE CV LAB;  Service: Cardiovascular;  Laterality: N/A;   FALSE ANEURYSM REPAIR Left 11/29/2018   Procedure: REPAIR FALSE ANEURYSM LEFT RADIAL ARTERY;  Surgeon: Larina Earthly, MD;  Location: [facility] OR;  Service: Vascular;  Laterality: Left;   FEMORAL ARTERY STENT Left ~ 2014   "taken out of my leg; couldn' catorgorize what kind so the put it under all 3"; cataroziepitheloid hemanioendotheliomau   FOOT FRACTURE SURGERY Left 1973   FRACTURE SURGERY     INSERT / REPLACE / REMOVE PACEMAKER  10/13/05   right side, medtronic Adapta   KNEE HARDWARE REMOVAL Right  1950's   "3-4 months after the insertion"   KNEE SURGERY Right 1950's   "broke my lower leg; had to put pin in my knee to keep lower leg in place til it healed"   LEFT HEART CATH AND CORS/GRAFTS ANGIOGRAPHY N/A 04/12/2017   Procedure: LEFT HEART CATH AND CORS/GRAFTS ANGIOGRAPHY;  Surgeon: Runell Gess, MD;  Location: [facility] INVASIVE CV LAB;  Service: Cardiovascular;  Laterality: N/A;   LEFT HEART CATH AND CORS/GRAFTS ANGIOGRAPHY N/A 02/06/2018   Procedure: LEFT HEART CATH AND CORS/GRAFTS ANGIOGRAPHY;  Surgeon: Lennette Bihari, MD;  Location: [facility] INVASIVE CV LAB;  Service: Cardiovascular;  Laterality: N/A;   LEFT HEART CATH AND CORS/GRAFTS ANGIOGRAPHY N/A 11/12/2018   Procedure: LEFT HEART CATH AND CORS/GRAFTS ANGIOGRAPHY;  Surgeon: Iran Ouch, MD;  Location: [facility] INVASIVE CV LAB;  Service: Cardiovascular;  Laterality: N/A;   LEFT HEART CATH AND CORS/GRAFTS ANGIOGRAPHY N/A 06/22/2020   Procedure: LEFT HEART CATH AND CORS/GRAFTS ANGIOGRAPHY;  Surgeon: Eldridge Dace,  Donnie Coffin, MD;  Location: [facility] INVASIVE CV LAB;  Service: Cardiovascular;  Laterality: N/A;   LEFT HEART CATH AND CORS/GRAFTS ANGIOGRAPHY N/A 12/01/2022   Procedure: LEFT HEART CATH AND CORS/GRAFTS ANGIOGRAPHY;  Surgeon: [place], Peter M, MD;  Location: [facility] INVASIVE CV LAB;  Service: Cardiovascular;  Laterality: N/A;   PERIPHERAL VASCULAR CATHETERIZATION N/A 11/25/2015   Procedure: Lower Extremity Angiography;  Surgeon: Runell Gess, MD;  Location: [facility] INVASIVE CV LAB;  Service: Cardiovascular;  Laterality: N/A;   PERIPHERAL VASCULAR CATHETERIZATION Left 11/25/2015   Procedure: Peripheral Vascular Intervention;  Surgeon: Runell Gess, MD;  Location: [facility] INVASIVE CV LAB;  Service: Cardiovascular;  Laterality: Left;  external iliac   PERIPHERAL VASCULAR CATHETERIZATION N/A 01/03/2016   Procedure: Lower Extremity Angiography;  Surgeon: Runell Gess, MD;  Location: [facility] INVASIVE CV LAB;  Service: Cardiovascular;  Laterality: N/A;   PERIPHERAL  VASCULAR CATHETERIZATION Left 01/03/2016   Procedure: Peripheral Vascular Intervention;  Surgeon: Runell Gess, MD;  Location: [facility] INVASIVE CV LAB;  Service: Cardiovascular;  Laterality: Left;  SFA   PERIPHERAL VASCULAR CATHETERIZATION Right 02/14/2016   Procedure: Peripheral Vascular Atherectomy;  Surgeon: Runell Gess, MD;  Location: [facility] INVASIVE CV LAB;  Service: Cardiovascular;  Laterality: Right;  SFA   POPLITEAL ARTERY STENT  01/03/2016   Contralateral access with a 7 [name] crossover sheath (second order catheter placement)   TONSILLECTOMY AND ADENOIDECTOMY     TUMOR EXCISION Right ~ 2005   cancerous tumor removed from shoulder   TUMOR EXCISION Right ~ 2000   benign tumor removed from under shoulder   TUMOR EXCISION Left 06/02/2010   resection of Lt SFA wth interposition of Gore-Tex graft   Social History:  reports that he quit smoking about 51 years ago. His smoking use included pipe. He has never used smokeless tobacco. He reports that he does not drink alcohol and does not use drugs.  Allergies  Allergen Reactions   Peanut-Containing Drug Products Anaphylaxis and Other (See Comments)    Tongue swelling is severe    Ace Inhibitors Cough        Diltiazem Hcl Other (See Comments)    UNSPECIFIED REACTION     Meloxicam Other (See Comments)    GI upset    Doxycycline Other (See Comments) and Cough    Reaction of cough and runny nose   Eliquis [Apixaban] Other (See Comments)    dizziness   Sertraline Hcl Other (See Comments)   Carvedilol Itching   Clonidine Hcl Other (See Comments)    Patch only - skin irritation   Duloxetine Hcl Other (See Comments)    Urinary frequency    Family History  Problem Relation Age of Onset   Coronary artery disease Mother    Heart attack Mother    Hypertension Mother    Heart disease Mother        Open  Heart surgery   Diabetes Father    Heart disease Father    Hyperlipidemia Father    Heart attack Father    Hypertension Sister     Diabetes Sister    Heart disease Sister    Liver disease Neg Hx    Esophageal cancer Neg Hx    Colon cancer Neg Hx     Prior to Admission medications   Medication Sig Start Date End Date Taking? Authorizing Provider  atorvastatin (LIPITOR) 80 MG tablet Take 1 tablet (80 mg total) by mouth daily at 6 PM. 02/09/18   Janetta Hora, PA-C  Cholecalciferol (VITAMIN D) 50 MCG (2000 UT) CAPS Take by mouth.    [provider]  empagliflozin (JARDIANCE) 10 MG TABS tablet Take 1 tablet (10 mg total) by mouth daily. 12/03/22 01/02/23  Hughie Closs, MD  finasteride (PROSCAR) 5 MG tablet Take 5 mg by mouth daily.    [provider]  glipiZIDE (GLUCOTROL) 5 MG tablet Take 5 mg by mouth daily with breakfast.  01/10/18   [provider]  HYDROcodone-acetaminophen (NORCO/VICODIN) 5-325 MG tablet Take 1 tablet by mouth every 6 (six) hours as needed for moderate pain.    [provider]  isosorbide mononitrate (IMDUR) 60 MG 24 hr tablet Take 1.5 tablets (90 mg total) by mouth daily. 11/21/22 02/19/23  Jodelle Gross, NP  melatonin 5 MG TABS Take 5 mg by mouth at bedtime.    [provider]  metFORMIN (GLUCOPHAGE) 1000 MG tablet Take 1 tablet (1,000 mg total) by mouth 2 (two) times daily with a meal. Restart on 3/18. 06/23/20   [facility], Roe Rutherford, PA  metoprolol tartrate (LOPRESSOR) 100 MG tablet Take 1 tablet (100 mg total) by mouth 2 (two) times daily. 08/07/17   Jodelle Gross, NP  MULTIPLE VITAMIN-FOLIC ACID PO Take 1 tablet by mouth daily.    [provider]  nitroGLYCERIN (NITROSTAT) 0.4 MG SL tablet Place 1 tablet (0.4 mg total) under the tongue every 5 (five) minutes as needed for chest pain. 11/21/22   Jodelle Gross, NP  ranolazine (RANEXA) 1000 MG SR tablet Take 1 tablet (1,000 mg total) by mouth 2 (two) times daily. 08/30/22   Ronney Asters, NP  Rivaroxaban (XARELTO) 15 MG TABS tablet Take 1 tablet (15 mg total) by mouth daily  with supper. 12/02/22 01/01/23  Hughie Closs, MD  sacubitril-valsartan (ENTRESTO) 49-51 MG Take 1 tablet by mouth 2 (two) times daily. 12/02/22 01/01/23  Hughie Closs, MD  tamsulosin (FLOMAX) 0.4 MG CAPS capsule Take 0.4 mg by mouth 2 (two) times daily.  03/09/15   [provider]    Physical Exam: Vitals:   12/18/22 1118 12/18/22 1135 12/18/22 1259 12/18/22 1500  BP: (!) 47/22 95/60 124/60 120/60  Pulse: 62 64 63 61  Resp:  15 18 (!) 22  Temp:      TempSrc:      SpO2:  100% 100% 100%   Constitutional: Elderly man in NAD, calm, comfortable Eyes: PERRL, lids and conjunctivae normal ENMT: Mucous membranes are moist.   Fair detention. Neck: normal, supple  Respiratory: clear to auscultation bilaterally, no wheezing, no crackles.  No accessory muscle use.  Cardiovascular: Regular rate and rhythm, no murmurs / rubs / gallops. No extremity edema.   Abdomen: no tenderness, no masses palpated.  Bowel sounds positive.  Musculoskeletal: no clubbing / cyanosis. No joint deformity upper and lower extremities. Good ROM, no contractures. Normal muscle tone.  Skin: no rashes, lesions, ulcers. Pallor present  Neurologic: CN 2-12 grossly intact. Strength 5/5 in all 4.  Psychiatric: Normal judgment and insight. Alert and oriented x 3. Normal mood.   Data Reviewed:  EKG reveals atrial paced rhythm at 76 bpm with prolonged AV conduction and rightward axis deviation.  Reviewed labs, imaging, and pertinent records as noted above in HPI.  Assessment and Plan:  Sepsis  2/2 urinary tract infection with hematuria Patient has Foley catheter in place.  Urinalysis noted to be bloody with many bacteria, greater than 50 RBCs/hpf, and greater than 50 WBCs.  WBC appears to chronically be elevated, but  lactic acid was 3.3.  Urine and blood cultures were obtained.  Patient was started on empiric antibiotics of Rocephin.  Patient notes that he had taken Xarelto, but Plavix have been on hold since sometime last  week.  Urology had been consulted. -Admit to a progressive bed -Follow-up urine and blood cultures -Hold Xarelto -Continue three-way bladder irrigation -Empiric antibiotics of Rocephin IV -Trend lactic acid levels -Appreciate Dr. Lafonda Mosses of urology consultative services we will follow-up for any further recommendations  Transient hypotension Systolic blood pressures initially reported to be 47/22.  Blood pressures improved after 1 L of IV fluids. -Hold home blood pressure regimen.  Determine when medically appropriate to start resuming home meds. 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 Hemoglobin noted to be 9.3 with low MCH.  He reports that he was scheduled to have iron infusion tomorrow. -Type and screen -Ferric gluconate infusion ordered -Serial monitoring of H&H -Transfuse blood products as need for Hbg <8 g/dL  Renal insuffiencey On admission creatinine elevated up at 1.25.  Baseline creatinine previously been around 1-1.1. -Continue to monitor kidney function daily.  Paroxysmal atrial fibrillation on chronic anticoagulation Sick sinus syndrome s/p pacemaker Patient reports last taking Xarelto this morning. -Hold Xarelto  Diabetes mellitus type 2, without long-term use of insulin Last Hbga1c was 6.6. -Hypoglycemic protocols -Hold metformin and glipizide -CBGs qAC with SSI  CAD Patient just recently had NSTEMI at the end of last month.  Patient underwent left heart cath on 8/23 by Dr. [place] which showed occluded SVG to RCA, no intervention possible, per cardiology.  They recommended medical management and recommended continuing aspirin, Plavix, Imdur, metoprolol tartrate and Ranexa  -Continue atorvastatin  Dyslipidemia -Continue Atorvastatin   DVT prophylaxis: SCDs Advance Care Planning:   Code Status: Full Code  Consults: Urology  Family Communication: Son updated at  bedside  Severity of Illness: The appropriate patient status for this patient is INPATIENT. Inpatient status is judged to be reasonable and necessary in order to provide the required intensity of service to ensure the patient's safety. The patient's presenting symptoms, physical exam findings, and initial radiographic and laboratory data in the context of their chronic comorbidities is felt to place them at high risk for further clinical deterioration. Furthermore, it is not anticipated that the patient will be medically stable for discharge from the hospital within 2 midnights of admission.   * I certify that at the point of admission it is my clinical judgment that the patient will require inpatient hospital care spanning beyond 2 midnights from the point of admission due to high intensity of service, high risk for further deterioration and high frequency of surveillance required.*  Author: Clydie Braun, MD 12/18/2022 3:13 PM  For on call review [email or web address].

## 2022-12-18 NOTE — ED Provider Notes (Signed)
EMERGENCY DEPARTMENT AT [facility] Provider Note   CSN: [id number] Arrival date & time: 12/18/22  1031     History  Chief Complaint  Patient presents with   Dizziness   Hematuria    Robert Burns is a 82 y.o. male.   Dizziness Hematuria     82 year old male with medical history significant for CAD status post CABG, atrial fibrillation, sick sinus syndrome status post pacemaker, CLL, recent episode of urinary retention status post catheterization who presents to the emergency department with blood in his urine and low blood pressure.  The patient has had some generalized suprapubic discomfort as well as persistent hematuria with some clots noted.  He has had to have his Foley catheter exchanged.  He denies any cough, shortness of breath, chest pain.  The patient arrived to the emergency department with soft blood pressures BP 92/53 and was emergently bedded.  He denies any recent melena or hematochezia.  Home Medications Prior to Admission medications   Medication Sig Start Date End Date Taking? Authorizing Provider  atorvastatin (LIPITOR) 80 MG tablet Take 1 tablet (80 mg total) by mouth daily at 6 PM. 02/09/18  Yes Janetta Hora, PA-C  Cholecalciferol (VITAMIN D) 50 MCG (2000 UT) CAPS Take 1 capsule by mouth 2 (two) times daily.   Yes [provider]  empagliflozin (JARDIANCE) 10 MG TABS tablet Take 1 tablet (10 mg total) by mouth daily. 12/03/22 01/02/23 Yes Pahwani, Daleen Bo, MD  finasteride (PROSCAR) 5 MG tablet Take 5 mg by mouth daily.   Yes [provider]  glipiZIDE (GLUCOTROL) 5 MG tablet Take 5 mg by mouth daily with breakfast.  01/10/18  Yes [provider]  HYDROcodone-acetaminophen (NORCO/VICODIN) 5-325 MG tablet Take 1 tablet by mouth every 6 (six) hours as needed for moderate pain.   Yes [provider]  isosorbide mononitrate (IMDUR) 60 MG 24 hr tablet Take 1.5 tablets (90 mg total) by mouth daily. 11/21/22  02/19/23 Yes Jodelle Gross, NP  melatonin 5 MG TABS Take 5 mg by mouth at bedtime.   Yes [provider]  metFORMIN (GLUCOPHAGE) 1000 MG tablet Take 1 tablet (1,000 mg total) by mouth 2 (two) times daily with a meal. Restart on 3/18. 06/23/20  Yes Duke, Roe Rutherford, PA  metoprolol tartrate (LOPRESSOR) 100 MG tablet Take 1 tablet (100 mg total) by mouth 2 (two) times daily. 08/07/17  Yes Jodelle Gross, NP  MULTIPLE VITAMIN-FOLIC ACID PO Take 1 tablet by mouth daily.   Yes [provider]  nitroGLYCERIN (NITROSTAT) 0.4 MG SL tablet Place 1 tablet (0.4 mg total) under the tongue every 5 (five) minutes as needed for chest pain. 11/21/22  Yes Jodelle Gross, NP  ranolazine (RANEXA) 500 MG 12 hr tablet Take 500 mg by mouth 2 (two) times daily.   Yes [provider]  rivaroxaban (XARELTO) 20 MG TABS tablet Take 20 mg by mouth daily with supper.   Yes [provider]  sacubitril-valsartan (ENTRESTO) 49-51 MG Take 1 tablet by mouth 2 (two) times daily. 12/02/22 01/01/23 Yes Pahwani, Daleen Bo, MD  tamsulosin (FLOMAX) 0.4 MG CAPS capsule Take 0.4 mg by mouth 2 (two) times daily.  03/09/15  Yes [provider]      Allergies    Peanut-containing drug products, Ace inhibitors, Diltiazem hcl, Meloxicam, Doxycycline, Eliquis [apixaban], Sertraline hcl, Carvedilol, Clonidine hcl, and Duloxetine hcl    Review of Systems   Review of Systems  Genitourinary:  Positive for  hematuria.  Neurological:  Positive for dizziness.  All other systems reviewed and are negative.   Physical Exam Updated Vital Signs BP (!) 132/54 (BP Location: Right Arm)   Pulse 60   Temp 97.9 F (36.6 C) (Oral)   Resp 17   SpO2 100%  Physical Exam Vitals and nursing note reviewed. Exam conducted with a chaperone present.  Constitutional:      General: He is not in acute distress.    Appearance: He is well-developed.  HENT:     Head: Normocephalic and atraumatic.  Eyes:      Conjunctiva/sclera: Conjunctivae normal.  Cardiovascular:     Rate and Rhythm: Normal rate and regular rhythm.     Pulses: Normal pulses.  Pulmonary:     Effort: Pulmonary effort is normal. No respiratory distress.     Breath sounds: Normal breath sounds.  Abdominal:     Palpations: Abdomen is soft.     Tenderness: There is abdominal tenderness in the suprapubic area.  Genitourinary:    Comments: No melena or hematochezia, fecal occult negative.  Foley catheter in place draining dark cloudy red urine Musculoskeletal:        General: No swelling.     Cervical back: Neck supple.  Skin:    General: Skin is warm and dry.     Capillary Refill: Capillary refill takes less than 2 seconds.     Coloration: Skin is pale.  Neurological:     Mental Status: He is alert.  Psychiatric:        Mood and Affect: Mood normal.     ED Results / Procedures / Treatments   Labs (all labs ordered are listed, but only abnormal results are displayed) Labs Reviewed  COMPREHENSIVE METABOLIC PANEL - Abnormal; Notable for the following components:      Result Value   CO2 17 (*)    Glucose, Bld 101 (*)    Creatinine, Ser 1.25 (*)    Calcium 8.2 (*)    Total Protein 5.6 (*)    Albumin 3.1 (*)    GFR, Estimated 57 (*)    All other components within normal limits  CBC WITH DIFFERENTIAL/PLATELET - Abnormal; Notable for the following components:   WBC 23.2 (*)    RBC 3.88 (*)    Hemoglobin 9.3 (*)    HCT 32.7 (*)    MCH 24.0 (*)    MCHC 28.4 (*)    RDW 23.1 (*)    Neutro Abs 12.3 (*)    Lymphs Abs 10.7 (*)    All other components within normal limits  PROTIME-INR - Abnormal; Notable for the following components:   Prothrombin Time 29.4 (*)    INR 2.8 (*)    All other components within normal limits  APTT - Abnormal; Notable for the following components:   aPTT 39 (*)    All other components within normal limits  URINALYSIS, W/ REFLEX TO CULTURE (INFECTION SUSPECTED) - Abnormal; Notable for the  following components:   Color, Urine RED (*)    APPearance TURBID (*)    Glucose, UA   (*)    Value: TEST NOT REPORTED DUE TO COLOR INTERFERENCE OF URINE PIGMENT   Hgb urine dipstick   (*)    Value: TEST NOT REPORTED DUE TO COLOR INTERFERENCE OF URINE PIGMENT   Bilirubin Urine   (*)    Value: TEST NOT REPORTED DUE TO COLOR INTERFERENCE OF URINE PIGMENT   Ketones, ur   (*)    Value:  TEST NOT REPORTED DUE TO COLOR INTERFERENCE OF URINE PIGMENT   Protein, ur   (*)    Value: TEST NOT REPORTED DUE TO COLOR INTERFERENCE OF URINE PIGMENT   Nitrite   (*)    Value: TEST NOT REPORTED DUE TO COLOR INTERFERENCE OF URINE PIGMENT   Leukocytes,Ua   (*)    Value: TEST NOT REPORTED DUE TO COLOR INTERFERENCE OF URINE PIGMENT   Bacteria, UA MANY (*)    All other components within normal limits  HEMOGLOBIN AND HEMATOCRIT, BLOOD - Abnormal; Notable for the following components:   Hemoglobin 8.3 (*)    HCT 29.1 (*)    All other components within normal limits  HEMOGLOBIN AND HEMATOCRIT, BLOOD - Abnormal; Notable for the following components:   Hemoglobin 7.7 (*)    HCT 26.7 (*)    All other components within normal limits  GLUCOSE, CAPILLARY - Abnormal; Notable for the following components:   Glucose-Capillary 136 (*)    All other components within normal limits  I-STAT CG4 LACTIC ACID, ED - Abnormal; Notable for the following components:   Lactic Acid, Venous 3.3 (*)    All other components within normal limits  I-STAT CG4 LACTIC ACID, ED - Abnormal; Notable for the following components:   Lactic Acid, Venous 3.1 (*)    All other components within normal limits  CULTURE, BLOOD (ROUTINE X 2)  CULTURE, BLOOD (ROUTINE X 2)  URINE CULTURE  CBC  BASIC METABOLIC PANEL  CBG MONITORING, ED  POC OCCULT BLOOD, ED  TYPE AND SCREEN  TROPONIN I (HIGH SENSITIVITY)  TROPONIN I (HIGH SENSITIVITY)    EKG EKG Interpretation Date/Time:  Monday December 18 2022 10:25:09 EDT Ventricular Rate:  76 PR  Interval:  226 QRS Duration:  118 QT Interval:  400 QTC Calculation: 450 R Axis:   98  Text Interpretation: Atrial-paced rhythm with prolonged AV conduction Rightward axis Anteroseptal infarct , age undetermined T wave abnormality, consider inferior ischemia Abnormal ECG No significant change since last tracing Confirmed by Ernie Avena ([id number]) on 12/18/2022 2:18:03 PM  Radiology DG Chest Port 1 View  Result Date: 12/18/2022 CLINICAL DATA:  Dizziness. Hematuria. Possible sepsis. Hypotension. Short of breath. EXAM: PORTABLE CHEST 1 VIEW COMPARISON:  11/30/2022 FINDINGS: Previous median sternotomy and CABG. Dual lead pacemaker with leads in the region of the right atrium and right ventricle. The pulmonary vascularity is normal. The lungs are clear. No visible effusion. No acute bone finding. IMPRESSION: No active disease. Previous CABG. Dual lead pacemaker. Electronically Signed   By: Paulina Fusi M.D.   On: 12/18/2022 12:47    Procedures .Critical Care  Performed by: Ernie Avena, MD Authorized by: Ernie Avena, MD   Critical care provider statement:    Critical care time (minutes):  30   Critical care was necessary to treat or prevent imminent or life-threatening deterioration of the following conditions:  Sepsis   Critical care was time spent personally by me on the following activities:  Development of treatment plan with patient or surrogate, discussions with consultants, evaluation of patient's response to treatment, examination of patient, ordering and review of laboratory studies, ordering and review of radiographic studies, ordering and performing treatments and interventions, pulse oximetry, re-evaluation of patient's condition and review of old charts     Medications Ordered in ED Medications  sodium chloride irrigation 0.9 % 3,000 mL (3,000 mLs Irrigation New Bag/Given 12/18/22 2030)  sodium chloride flush (NS) 0.9 % injection 3 mL (3 mLs Intravenous Given 12/18/22 2109)  0.9 %   sodium chloride infusion ( Intravenous New Bag/Given 12/18/22 1618)  acetaminophen (TYLENOL) tablet 650 mg (has no administration in time range)    Or  acetaminophen (TYLENOL) suppository 650 mg (has no administration in time range)  albuterol (PROVENTIL) (2.5 MG/3ML) 0.083% nebulizer solution 2.5 mg (has no administration in time range)  cefTRIAXone (ROCEPHIN) 2 g in sodium chloride 0.9 % 100 mL IVPB (has no administration in time range)  ferric gluconate (FERRLECIT) 250 mg in sodium chloride 0.9 % 250 mL IVPB (250 mg Intravenous New Bag/Given 12/18/22 1823)  atorvastatin (LIPITOR) tablet 80 mg (has no administration in time range)  HYDROcodone-acetaminophen (NORCO/VICODIN) 5-325 MG per tablet 1 tablet (has no administration in time range)  empagliflozin (JARDIANCE) tablet 10 mg (has no administration in time range)  finasteride (PROSCAR) tablet 5 mg (has no administration in time range)  tamsulosin (FLOMAX) capsule 0.4 mg (has no administration in time range)  melatonin tablet 5 mg (5 mg Oral Given 12/18/22 2109)  insulin aspart (novoLOG) injection 0-9 Units (has no administration in time range)  lactated ringers bolus 1,000 mL (0 mLs Intravenous Stopped 12/18/22 1334)  cefTRIAXone (ROCEPHIN) 1 g in sodium chloride 0.9 % 100 mL IVPB (0 g Intravenous Stopped 12/18/22 1619)  cefTRIAXone (ROCEPHIN) 1 g in sodium chloride 0.9 % 100 mL IVPB (0 g Intravenous Stopped 12/18/22 1753)    ED Course/ Medical Decision Making/ A&P Clinical Course as of 12/18/22 2324  Mon Dec 18, 2022  1230 WBC(!): 23.2 [JL]  1230 Lactic Acid, Venous(!!): 3.3 [JL]  1230 CO2(!): 17 [JL]    Clinical Course User Index [JL] Ernie Avena, MD                                 Medical Decision Making Amount and/or Complexity of Data Reviewed Labs: ordered. Decision-making details documented in ED Course. Radiology: ordered. ECG/medicine tests: ordered.  Risk Prescription drug management. Decision regarding  hospitalization.    82 year old male with medical history significant for CAD status post CABG, atrial fibrillation, sick sinus syndrome status post pacemaker, CLL, recent episode of urinary retention status post catheterization who presents to the emergency department with blood in his urine and low blood pressure.  The patient has had some generalized suprapubic discomfort as well as persistent hematuria with some clots noted.  He has had to have his Foley catheter exchanged.  He denies any cough, shortness of breath, chest pain.  The patient arrived to the emergency department with soft blood pressures BP 92/53 and was emergently bedded.  He denies any recent melena or hematochezia.  On arrival, the patient was afebrile, not tachycardic or tachypneic, initial blood pressure 92/53.  While in triage, the patient became pale, diaphoretic and near syncopal, blood pressure was 47/22 and the patient was emergently bedded.  IV access was obtained and the patient was administered IV fluid bolus.  Differential diagnosis includes infectious etiology of the patient's hypotension which would include urinary tract infection, catheter associated urinary tract infection, intra-abdominal infection, pneumonia.  Additionally considered dehydration, considered vasovagal episode.  On my evaluation, the blood pressure had improved.  Considered GI bleed because the patient was on Xarelto and had low blood pressure.  Minimal suprapubic discomfort noted.  Blood in the patient's Foley catheter.  Hematuria noted.  The patient's Foley catheter was exchanged with a three-way Foley catheter and was irrigated, continued hematuria was noted.  He was started on continuous  bladder irrigation.  CBC revealed a anemia to 9.3, WBC 23.2 which is at the patient's baseline.  Urinalysis revealed evidence of UTI with greater than 50 WBCs, many bacteria present.  Hematuria noted.  The remainder of urinalysis difficult.  The patient had a lactic  acidosis to 3.3 which could be due to the patient's hypotension, consider sepsis.  Blood cultures and urine cultures were sent.  Given the patient's urinalysis, the patient was started on IV Rocephin.  He remained hemodynamically stable.  His fecal occult was negative.  I did speak with on-call urology who will see the patient in consultation.  Message sent to Dr. Lafonda Mosses of Urology per the oncall NP's request.  The patient was subsequently admitted to the hospitalist service, Dr. Katrinka Blazing accepting.  Final Clinical Impression(s) / ED Diagnoses Final diagnoses:  Lower urinary tract infectious disease  Hypotension, unspecified hypotension type  Lactic acidosis  Sepsis, due to unspecified organism, unspecified whether acute organ dysfunction present [facility])    Rx / DC Orders ED Discharge Orders     None         Ernie Avena, MD 12/18/22 2324

## 2022-12-18 NOTE — ED Triage Notes (Signed)
Pt c.o dizziness for the past 2 days and blood in his foley catheter for the past 4 days. Pt pale and hypotensive in triage.

## 2022-12-19 ENCOUNTER — Ambulatory Visit: Payer: Medicare HMO

## 2022-12-19 DIAGNOSIS — A419 Sepsis, unspecified organism: Secondary | ICD-10-CM | POA: Diagnosis not present

## 2022-12-19 DIAGNOSIS — R6520 Severe sepsis without septic shock: Secondary | ICD-10-CM | POA: Diagnosis not present

## 2022-12-19 DIAGNOSIS — Z7901 Long term (current) use of anticoagulants: Secondary | ICD-10-CM | POA: Diagnosis not present

## 2022-12-19 DIAGNOSIS — Z5181 Encounter for therapeutic drug level monitoring: Secondary | ICD-10-CM | POA: Diagnosis not present

## 2022-12-19 DIAGNOSIS — Z951 Presence of aortocoronary bypass graft: Secondary | ICD-10-CM | POA: Diagnosis not present

## 2022-12-19 DIAGNOSIS — Z950 Presence of cardiac pacemaker: Secondary | ICD-10-CM | POA: Diagnosis not present

## 2022-12-19 DIAGNOSIS — I480 Paroxysmal atrial fibrillation: Secondary | ICD-10-CM | POA: Diagnosis not present

## 2022-12-19 DIAGNOSIS — I2510 Atherosclerotic heart disease of native coronary artery without angina pectoris: Secondary | ICD-10-CM | POA: Diagnosis not present

## 2022-12-19 LAB — VITAMIN B12: Vitamin B-12: 508 pg/mL (ref 180–914)

## 2022-12-19 LAB — C-REACTIVE PROTEIN: CRP: 1.5 mg/dL — ABNORMAL HIGH (ref <1.0)

## 2022-12-19 LAB — CBC
HCT: 23.9 % — ABNORMAL LOW (ref 39.0–52.0)
HCT: 29.4 % — ABNORMAL LOW (ref 39.0–52.0)
Hemoglobin: 7 g/dL — ABNORMAL LOW (ref 13.0–17.0)
Hemoglobin: 9 g/dL — ABNORMAL LOW (ref 13.0–17.0)
MCH: 23.2 pg — ABNORMAL LOW (ref 26.0–34.0)
MCH: 25 pg — ABNORMAL LOW (ref 26.0–34.0)
MCHC: 29.3 g/dL — ABNORMAL LOW (ref 30.0–36.0)
MCHC: 30.6 g/dL (ref 30.0–36.0)
MCV: 79.1 fL — ABNORMAL LOW (ref 80.0–100.0)
MCV: 81.7 fL (ref 80.0–100.0)
Platelets: 143 10*3/uL — ABNORMAL LOW (ref 150–400)
Platelets: 138 10*3/uL — ABNORMAL LOW (ref 150–400)
RBC: 3.02 MIL/uL — ABNORMAL LOW (ref 4.22–5.81)
RBC: 3.6 MIL/uL — ABNORMAL LOW (ref 4.22–5.81)
RDW: 23 % — ABNORMAL HIGH (ref 11.5–15.5)
RDW: 21.1 % — ABNORMAL HIGH (ref 11.5–15.5)
WBC: 20.7 10*3/uL — ABNORMAL HIGH (ref 4.0–10.5)
WBC: 19.2 10*3/uL — ABNORMAL HIGH (ref 4.0–10.5)
nRBC: 0 % (ref 0.0–0.2)
nRBC: 0 % (ref 0.0–0.2)

## 2022-12-19 LAB — GLUCOSE, CAPILLARY
Glucose-Capillary: 116 mg/dL — ABNORMAL HIGH (ref 70–99)
Glucose-Capillary: 185 mg/dL — ABNORMAL HIGH (ref 70–99)
Glucose-Capillary: 126 mg/dL — ABNORMAL HIGH (ref 70–99)
Glucose-Capillary: 143 mg/dL — ABNORMAL HIGH (ref 70–99)

## 2022-12-19 LAB — BASIC METABOLIC PANEL
Anion gap: 6 (ref 5–15)
BUN: 25 mg/dL — ABNORMAL HIGH (ref 8–23)
CO2: 19 mmol/L — ABNORMAL LOW (ref 22–32)
Calcium: 7.9 mg/dL — ABNORMAL LOW (ref 8.9–10.3)
Chloride: 111 mmol/L (ref 98–111)
Creatinine, Ser: 1.17 mg/dL (ref 0.61–1.24)
GFR, Estimated: >60 mL/min (ref >60)
Glucose, Bld: 99 mg/dL (ref 70–99)
Potassium: 4.1 mmol/L (ref 3.5–5.1)
Sodium: 136 mmol/L (ref 135–145)

## 2022-12-19 LAB — FERRITIN: Ferritin: 136 ng/mL (ref 24–336)

## 2022-12-19 LAB — IRON AND TIBC
Iron: 218 ug/dL — ABNORMAL HIGH (ref 45–182)
Saturation Ratios: 85 % — ABNORMAL HIGH (ref 17.9–39.5)
TIBC: 258 ug/dL (ref 250–450)
UIBC: 40 ug/dL

## 2022-12-19 LAB — FOLATE: Folate: 10.3 ng/mL (ref >5.9)

## 2022-12-19 LAB — RETICULOCYTES
Immature Retic Fract: 31 % — ABNORMAL HIGH (ref 2.3–15.9)
RBC.: 3.58 MIL/uL — ABNORMAL LOW (ref 4.22–5.81)
Retic Count, Absolute: 78.4 10*3/uL (ref 19.0–186.0)
Retic Ct Pct: 2.2 % (ref 0.4–3.1)

## 2022-12-19 LAB — BRAIN NATRIURETIC PEPTIDE: B Natriuretic Peptide: 234.3 pg/mL — ABNORMAL HIGH (ref 0.0–100.0)

## 2022-12-19 LAB — PROCALCITONIN: Procalcitonin: <0.1 ng/mL

## 2022-12-19 LAB — PREPARE RBC (CROSSMATCH)

## 2022-12-19 MED ORDER — ISOSORBIDE MONONITRATE ER 30 MG PO TB24
30.0000 mg | ORAL_TABLET | Freq: Every day | ORAL | Status: DC
  Administered 2022-12-20 – 2022-12-23 (×4): 30 mg via ORAL
  Filled 2022-12-19 (×4): qty 1

## 2022-12-19 MED ORDER — PANTOPRAZOLE SODIUM 40 MG PO TBEC
40.0000 mg | DELAYED_RELEASE_TABLET | Freq: Every day | ORAL | Status: DC
  Administered 2022-12-19 – 2022-12-23 (×5): 40 mg via ORAL
  Filled 2022-12-19 (×5): qty 1

## 2022-12-19 MED ORDER — RANOLAZINE ER 500 MG PO TB12
500.0000 mg | ORAL_TABLET | Freq: Two times a day (BID) | ORAL | Status: DC
  Administered 2022-12-19 – 2022-12-23 (×9): 500 mg via ORAL
  Filled 2022-12-19 (×9): qty 1

## 2022-12-19 MED ORDER — METOPROLOL TARTRATE 25 MG PO TABS
25.0000 mg | ORAL_TABLET | Freq: Two times a day (BID) | ORAL | Status: DC
  Administered 2022-12-19 – 2022-12-22 (×7): 25 mg via ORAL
  Filled 2022-12-19 (×7): qty 1

## 2022-12-19 MED ORDER — FUROSEMIDE 10 MG/ML IJ SOLN
20.0000 mg | Freq: Once | INTRAMUSCULAR | Status: AC
  Administered 2022-12-19: 20 mg via INTRAVENOUS
  Filled 2022-12-19: qty 2

## 2022-12-19 MED ORDER — CHLORHEXIDINE GLUCONATE CLOTH 2 % EX PADS
6.0000 | MEDICATED_PAD | Freq: Every day | CUTANEOUS | Status: DC
  Administered 2022-12-19 – 2022-12-23 (×5): 6 via TOPICAL

## 2022-12-19 MED ORDER — SODIUM CHLORIDE 0.9% IV SOLUTION: Freq: Once | INTRAVENOUS | Status: AC

## 2022-12-19 NOTE — Evaluation (Signed)
Physical Therapy Brief Evaluation and Discharge Note Patient Details Name: Robert Burns MRN: [id number] DOB: 09-16-1940 Today's Date: 12/19/2022   History of Present Illness  82 y.o. male who presents 12/18/22 with complaints of blood in urine. +UTI, sepsis, hypotension, PMH: extensive CAD s/p CABG, 15-16 prior stents, HLD, HTN, DM2, OSA, PAD, PAF, SSS s/p PPM, CLL  Clinical Impression   Patient evaluated by Physical Therapy with no further acute PT needs identified. All education has been completed and the patient has no further questions. Orthostatic BPs were completed with pt initially dropping SBP , and then BP rebounded to nearly baseline (see vitals flowsheet for details).  Patient demonstrated modified independence with RW walking 200 ft. PT is signing off with referral to the Mobility Team. Thank you for this referral.        PT Assessment Patient does not need any further PT services  Assistance Needed at Discharge  None    Equipment Recommendations None recommended by PT  Recommendations for Other Services       Precautions/Restrictions Precautions Precautions: Other (comment);Fall (3 way foley catheter/CBI)        Mobility  Bed Mobility   Supine/Sidelying to sit: Modified independent (Device/Increased time) Sit to supine/sidelying: Modified independent (Device/Increased time) General bed mobility comments: HOB elevated, no rails, slight incr time  Transfers Overall transfer level: Needs assistance Equipment used: Rolling walker (2 wheels) Transfers: Sit to/from Stand Sit to Stand: Supervision           General transfer comment: vc for hand placement    Ambulation/Gait Ambulation/Gait assistance: Modified independent (Device/Increase time) Gait Distance (Feet): 200 Feet Assistive device: Rolling walker (2 wheels) Gait Pattern/deviations: Step-through pattern, Trunk flexed Gait Speed: Pace WFL General Gait Details: slight kyphosis  Home  Activity Instructions    Stairs            Modified Rankin (Stroke Patients Only)        Balance Overall balance assessment: Mild deficits observed, not formally tested Sitting-balance support: No upper extremity supported, Feet supported Sitting balance-Leahy Scale: Good     Standing balance support: No upper extremity supported, During functional activity Standing balance-Leahy Scale: Fair            Pertinent Vitals/Pain PT - Brief Vital Signs All Vital Signs Stable: Other (comment) (see vitals flowsheet) Pain Assessment Pain Assessment: Faces Faces Pain Scale: Hurts a little bit Pain Location: left knee Pain Descriptors / Indicators: Discomfort Pain Intervention(s): Limited activity within patient's tolerance, Monitored during session     Home Living Family/patient expects to be discharged to:: Private residence Living Arrangements: Spouse/significant other Available Help at Discharge: Family;Available 24 hours/day Home Environment: Stairs to enter;Stairs in home  Stairs-Number of Steps: 2 Home Equipment: Rolling Walker (2 wheels);Cane - single point;BSC/3in1;Shower seat   Additional Comments: Can live on main level; stairs down to basement with rail    Prior Function Level of Independence: Independent with assistive device(s) Comments: Has been usin gthe RW for 4-5 weeks due to L knee and hip pain due to "sciatica" - had an ortho appointment where BP was low and was admitted to the hospital    UE/LE Assessment   UE ROM/Strength/Tone/Coordination:  (defer to OT)    LE ROM/Strength/Tone/Coordination: Generalized weakness      Communication   Communication Communication: No apparent difficulties     Cognition Overall Cognitive Status: Appears within functional limits for tasks assessed/performed       General Comments General comments (skin integrity, edema,  etc.): Wife and son present.    Exercises     Assessment/Plan    PT Problem  List         PT Visit Diagnosis Difficulty in walking, not elsewhere classified (R26.2)    No Skilled PT Patient at baseline level of functioning;Patient is modified independent with all activity/mobility   Co-evaluation                AMPAC 6 Clicks Help needed turning from your back to your side while in a flat bed without using bedrails?: None Help needed moving from lying on your back to sitting on the side of a flat bed without using bedrails?: None Help needed moving to and from a bed to a chair (including a wheelchair)?: None Help needed standing up from a chair using your arms (e.g., wheelchair or bedside chair)?: None Help needed to walk in hospital room?: None Help needed climbing 3-5 steps with a railing? : None 6 Click Score: 24      End of Session Equipment Utilized During Treatment: Gait belt Activity Tolerance: Patient tolerated treatment well Patient left: in bed;with call bell/phone within reach;with bed alarm set;with family/visitor present Nurse Communication: Mobility status PT Visit Diagnosis: Difficulty in walking, not elsewhere classified (R26.2)     Time: 7829-5621 PT Time Calculation (min) (ACUTE ONLY): 27 min  Charges:   PT Evaluation $PT Eval Low Complexity: 1 Low PT Treatments $Gait Training: 8-22 mins     Jerolyn Center, PT Acute Rehabilitation Services  Office [phone]   Zena Amos  12/19/2022, 5:07 PM

## 2022-12-19 NOTE — Consult Note (Signed)
CARDIOLOGY CONSULT NOTE       Patient ID: Robert Burns MRN: [id number] DOB/AGE: May 13, 1940 82 y.o.  Admit date: 12/18/2022 Referring Physician: Thedore Mins Primary Physician: Thana Ates, MD Primary Cardiologist: Allyson Sabal Reason for Consultation: Anticoagulation management  Principal Problem:   Sepsis [facility]) Active Problems:   Type 2 diabetes mellitus with complication, without long-term current use of insulin (HCC)   SSS (sick sinus syndrome), medtronic adapta   Transient hypotension   CAD, multiple vessel   Paroxysmal atrial fibrillation (HCC)   Dyslipidemia (high LDL; low HDL)   CLL (chronic lymphocytic leukemia) (HCC)   Urinary tract infection with hematuria   Iron deficiency   Renal insufficiency   HPI:  82 y.o. admitted with urinary retention and hematuria. History of CAD prior CABG Last cath 12/01/22 with culprit occluded SVG to RCA not salvageable no intervention done  LIMA patent to LAD, known prior occlusion to LCX graft and some in stent restenosis in SVG to diagonal. Medical Rx He also has PAF and SSS S/P dual chamber PPM. He has been in NSR with A pacing. TTE done 11/30/22 EF 45-50% moderate bi atrial enlargement and mild AR.  No angina. Urology at bedside irrigating foley with continued hematuria. May need cystoscopy and possible tranfer to [facility] depending on OR coverage. Prior to admission no longer on palvix. Was on xarelto being held. For angina has been on lopressor, nitrates and ranexa. For decreased EF entresto no diuretic EDP at cath was 18 mmhg.    ROS All other systems reviewed and negative except as noted above  Past Medical History:  Diagnosis Date   Anemia    Anxiety    Arthritis    "all over" (11/25/2015)   CAD (coronary artery disease)    a. s/p CABG  06/2002; b. 02/01/15 PCI: DES to prox SVG to PDA, staged PCI of SVG to Diag in 02/2015; c. 04/2017 Cath/PCI: LM nl, LAD 100ost, 55m, 75d, LCX 60ost, OM2 80, RCA 100ost, RPDA 80, LIMA->LAD ok, VG->D1 patent stent,  VG->RPDA patent stent, 50p, VG->OM1->OM2 90p (3.0x24 Synergy DES), 100 between OM1->OM2 (med rx).   Cancer [facility])    Right Shoulder, Left Leg- BCC, SCC, AND MELANOMA   Colon polyp    Elevated cholesterol    Epithelioid hemangioendothelioma    s/p resection of left SFA/mass with interposition 6 mm GoreTex graft 06/02/10, s/p repeat resection for positive margins 09/22/10   High blood pressure    Hyperlipidemia    Hypertension    Leg pain    OSA on CPAP    PAD (peripheral artery disease) (HCC)    a. 10/2002 L SFA PTA/BMS; b. 8/17 LE Angio: LEIA 90 (9x40 self exp stent), LSFA short segment prox occlusion (staged PTA/stenting 01/03/2016), patent mid stent, RSFA 55m (staged PTA/DEB 02/14/2016).   Presence of permanent cardiac pacemaker    Medtronic   Renal insufficiency 12/18/2022   SSS (sick sinus syndrome) (HCC)    a. s/p PPM in 2007 with gen change 04/2015 - Medtronic Adapta ADDRL1, ser # [id number].   Type II diabetes mellitus (HCC)    Type II   Urgency of urination     Family History  Problem Relation Age of Onset   Coronary artery disease Mother    Heart attack Mother    Hypertension Mother    Heart disease Mother        Open  Heart surgery   Diabetes Father    Heart disease Father    Hyperlipidemia Father  Heart attack Father    Hypertension Sister    Diabetes Sister    Heart disease Sister    Liver disease Neg Hx    Esophageal cancer Neg Hx    Colon cancer Neg Hx     Social History   Socioeconomic History   Marital status: Married    Spouse name: Not on file   Number of children: 2   Years of education: Not on file   Highest education level: Not on file  Occupational History   Occupation: retired  Tobacco Use   Smoking status: Former    Types: Pipe    Quit date: 1973    Years since quitting: 51.7   Smokeless tobacco: Never   Tobacco comments:    "quit smoking in 1973"  Vaping Use   Vaping status: Never Used  Substance and Sexual Activity   Alcohol use: No    Drug use: No   Sexual activity: Not Currently  Other Topics Concern   Not on file  Social History Narrative   Not on file   Social Determinants of Health   Financial Resource Strain: Not on file  Food Insecurity: No Food Insecurity (12/18/2022)   Hunger Vital Sign    Worried About Running Out of Food in the Last Year: Never true    Ran Out of Food in the Last Year: Never true  Transportation Needs: No Transportation Needs (12/18/2022)   PRAPARE - Administrator, Civil Service (Medical): No    Lack of Transportation (Non-Medical): No  Physical Activity: Not on file  Stress: No Stress Concern Present (12/07/2022)   Received from [facility] - Occupational Stress Questionnaire    Feeling of Stress : Not at all  Social Connections: Unknown (08/18/2021)   Received from [facility]   Social Network    Social Network: Not on file  Intimate Partner Violence: Not At Risk (12/07/2022)   Received from [facility]   HITS    Over the last 12 months how often did your partner physically hurt you?: 1    Over the last 12 months how often did your partner insult you or talk down to you?: 1    Over the last 12 months how often did your partner threaten you with physical harm?: 1    Over the last 12 months how often did your partner scream or curse at you?: 1    Past Surgical History:  Procedure Laterality Date   CARDIAC CATHETERIZATION N/A 02/01/2015   Procedure: Left Heart Cath and Coronary Angiography;  Surgeon: Runell Gess, MD;  Location: [facility] INVASIVE CV LAB;  Service: Cardiovascular;  Laterality: N/A;   CARDIAC CATHETERIZATION N/A 02/01/2015   Procedure: Coronary Stent Intervention;  Surgeon: Runell Gess, MD;  Location: [facility] INVASIVE CV LAB;  Service: Cardiovascular;  Laterality: N/A;   CARDIAC CATHETERIZATION  06/2002   "just before bypass OR"   CARDIAC CATHETERIZATION N/A 03/01/2015   Procedure: Coronary Stent  Intervention;  Surgeon: Runell Gess, MD;  Location: [facility] INVASIVE CV LAB;  Service: Cardiovascular;  Laterality: N/A;   CARDIAC CATHETERIZATION  02/06/2018   COLONOSCOPY     CORONARY ANGIOPLASTY     CORONARY ARTERY BYPASS GRAFT  06/2002   x5, LIMA-LAD;VG- Diag; seq VG- ramus & OM branch; VG-PDA   CORONARY STENT INTERVENTION N/A 04/12/2017   Procedure: CORONARY STENT INTERVENTION;  Surgeon: Runell Gess, MD;  Location: [facility] INVASIVE CV  LAB;  Service: Cardiovascular;  Laterality: N/A;   CORONARY STENT INTERVENTION N/A 02/08/2018   Procedure: CORONARY STENT INTERVENTION;  Surgeon: Corky Crafts, MD;  Location: [facility] INVASIVE CV LAB;  Service: Cardiovascular;  Laterality: N/A;   CORONARY STENT INTERVENTION N/A 11/12/2018   Procedure: CORONARY STENT INTERVENTION;  Surgeon: Iran Ouch, MD;  Location: [facility] INVASIVE CV LAB;  Service: Cardiovascular;  Laterality: N/A;   CORONARY STENT INTERVENTION N/A 06/22/2020   Procedure: CORONARY STENT INTERVENTION;  Surgeon: Corky Crafts, MD;  Location: [facility] INVASIVE CV LAB;  Service: Cardiovascular;  Laterality: N/A;   EP IMPLANTABLE DEVICE N/A 04/23/2015   Procedure: PPM Generator Changeout;  Surgeon: Thurmon Fair, MD;  Location: [facility] INVASIVE CV LAB;  Service: Cardiovascular;  Laterality: N/A;   FALSE ANEURYSM REPAIR Left 11/29/2018   Procedure: REPAIR FALSE ANEURYSM LEFT RADIAL ARTERY;  Surgeon: Larina Earthly, MD;  Location: [facility] OR;  Service: Vascular;  Laterality: Left;   FEMORAL ARTERY STENT Left ~ 2014   "taken out of my leg; couldn' catorgorize what kind so the put it under all 3"; cataroziepitheloid hemanioendotheliomau   FOOT FRACTURE SURGERY Left 1973   FRACTURE SURGERY     INSERT / REPLACE / REMOVE PACEMAKER  10/13/05   right side, medtronic Adapta   KNEE HARDWARE REMOVAL Right 1950's   "3-4 months after the insertion"   KNEE SURGERY Right 1950's   "broke my lower leg; had to put pin in my knee to keep lower leg in place til it healed"    LEFT HEART CATH AND CORS/GRAFTS ANGIOGRAPHY N/A 04/12/2017   Procedure: LEFT HEART CATH AND CORS/GRAFTS ANGIOGRAPHY;  Surgeon: Runell Gess, MD;  Location: [facility] INVASIVE CV LAB;  Service: Cardiovascular;  Laterality: N/A;   LEFT HEART CATH AND CORS/GRAFTS ANGIOGRAPHY N/A 02/06/2018   Procedure: LEFT HEART CATH AND CORS/GRAFTS ANGIOGRAPHY;  Surgeon: Lennette Bihari, MD;  Location: [facility] INVASIVE CV LAB;  Service: Cardiovascular;  Laterality: N/A;   LEFT HEART CATH AND CORS/GRAFTS ANGIOGRAPHY N/A 11/12/2018   Procedure: LEFT HEART CATH AND CORS/GRAFTS ANGIOGRAPHY;  Surgeon: Iran Ouch, MD;  Location: [facility] INVASIVE CV LAB;  Service: Cardiovascular;  Laterality: N/A;   LEFT HEART CATH AND CORS/GRAFTS ANGIOGRAPHY N/A 06/22/2020   Procedure: LEFT HEART CATH AND CORS/GRAFTS ANGIOGRAPHY;  Surgeon: Corky Crafts, MD;  Location: [facility] INVASIVE CV LAB;  Service: Cardiovascular;  Laterality: N/A;   LEFT HEART CATH AND CORS/GRAFTS ANGIOGRAPHY N/A 12/01/2022   Procedure: LEFT HEART CATH AND CORS/GRAFTS ANGIOGRAPHY;  Surgeon: [place], Janoah Menna M, MD;  Location: [facility] INVASIVE CV LAB;  Service: Cardiovascular;  Laterality: N/A;   PERIPHERAL VASCULAR CATHETERIZATION N/A 11/25/2015   Procedure: Lower Extremity Angiography;  Surgeon: Runell Gess, MD;  Location: [facility] INVASIVE CV LAB;  Service: Cardiovascular;  Laterality: N/A;   PERIPHERAL VASCULAR CATHETERIZATION Left 11/25/2015   Procedure: Peripheral Vascular Intervention;  Surgeon: Runell Gess, MD;  Location: [facility] INVASIVE CV LAB;  Service: Cardiovascular;  Laterality: Left;  external iliac   PERIPHERAL VASCULAR CATHETERIZATION N/A 01/03/2016   Procedure: Lower Extremity Angiography;  Surgeon: Runell Gess, MD;  Location: [facility] INVASIVE CV LAB;  Service: Cardiovascular;  Laterality: N/A;   PERIPHERAL VASCULAR CATHETERIZATION Left 01/03/2016   Procedure: Peripheral Vascular Intervention;  Surgeon: Runell Gess, MD;  Location: [facility] INVASIVE CV LAB;  Service:  Cardiovascular;  Laterality: Left;  SFA   PERIPHERAL VASCULAR CATHETERIZATION Right 02/14/2016   Procedure: Peripheral Vascular Atherectomy;  Surgeon: Runell Gess, MD;  Location: [facility] INVASIVE  CV LAB;  Service: Cardiovascular;  Laterality: Right;  SFA   POPLITEAL ARTERY STENT  01/03/2016   Contralateral access with a 7 [name] crossover sheath (second order catheter placement)   TONSILLECTOMY AND ADENOIDECTOMY     TUMOR EXCISION Right ~ 2005   cancerous tumor removed from shoulder   TUMOR EXCISION Right ~ 2000   benign tumor removed from under shoulder   TUMOR EXCISION Left 06/02/2010   resection of Lt SFA wth interposition of Gore-Tex graft      Current Facility-Administered Medications:    0.9 %  sodium chloride infusion, , Intravenous, Continuous, Katrinka Blazing, Rondell A, MD, Last Rate: 75 mL/hr at 12/18/22 1618, New Bag at 12/18/22 1618   acetaminophen (TYLENOL) tablet 650 mg, 650 mg, Oral, Q6H PRN **OR** acetaminophen (TYLENOL) suppository 650 mg, 650 mg, Rectal, Q6H PRN, Katrinka Blazing, Rondell A, MD   albuterol (PROVENTIL) (2.5 MG/3ML) 0.083% nebulizer solution 2.5 mg, 2.5 mg, Nebulization, Q6H PRN, Katrinka Blazing, Rondell A, MD   atorvastatin (LIPITOR) tablet 80 mg, 80 mg, Oral, q1800, Smith, Rondell A, MD   cefTRIAXone (ROCEPHIN) 2 g in sodium chloride 0.9 % 100 mL IVPB, 2 g, Intravenous, Q24H, Smith, Rondell A, MD   Chlorhexidine Gluconate Cloth 2 % PADS 6 each, 6 each, Topical, Daily, Thedore Mins, Stanford Scotland, MD   empagliflozin (JARDIANCE) tablet 10 mg, 10 mg, Oral, Daily, Smith, Rondell A, MD   ferric gluconate (FERRLECIT) 250 mg in sodium chloride 0.9 % 250 mL IVPB, 250 mg, Intravenous, Daily, Madelyn Flavors A, MD, Last Rate: 135 mL/hr at 12/18/22 1823, 250 mg at 12/18/22 1823   finasteride (PROSCAR) tablet 5 mg, 5 mg, Oral, Daily, Smith, Rondell A, MD   furosemide (LASIX) injection 20 mg, 20 mg, Intravenous, Once, Thedore Mins, Stanford Scotland, MD   HYDROcodone-acetaminophen (NORCO/VICODIN) 5-325 MG per tablet 1  tablet, 1 tablet, Oral, Q6H PRN, Smith, Rondell A, MD   insulin aspart (novoLOG) injection 0-9 Units, 0-9 Units, Subcutaneous, TID WC, Smith, Rondell A, MD   melatonin tablet 5 mg, 5 mg, Oral, QHS, Smith, Rondell A, MD, 5 mg at 12/18/22 2109   sodium chloride flush (NS) 0.9 % injection 3 mL, 3 mL, Intravenous, Q12H, Smith, Rondell A, MD, 3 mL at 12/18/22 2109   Continuous Bladder Irrigation, , , Until Discontinued **AND** sodium chloride irrigation 0.9 % 3,000 mL, 3,000 mL, Irrigation, Continuous, Ernie Avena, MD, Last Rate: 0 mL/hr at 12/18/22 1455, 3,000 mL at 12/18/22 2030   tamsulosin (FLOMAX) capsule 0.4 mg, 0.4 mg, Oral, BID, Smith, Rondell A, MD  atorvastatin  80 mg Oral q1800   Chlorhexidine Gluconate Cloth  6 each Topical Daily   empagliflozin  10 mg Oral Daily   finasteride  5 mg Oral Daily   furosemide  20 mg Intravenous Once   insulin aspart  0-9 Units Subcutaneous TID WC   melatonin  5 mg Oral QHS   sodium chloride flush  3 mL Intravenous Q12H   tamsulosin  0.4 mg Oral BID    sodium chloride 75 mL/hr at 12/18/22 1618   cefTRIAXone (ROCEPHIN)  IV     ferric gluconate (FERRLECIT) IVPB 250 mg (12/18/22 1823)   sodium chloride irrigation 0 mL (12/18/22 1455)    Physical Exam: Blood pressure (!) 116/48, pulse (!) 59, temperature 98 F (36.7 C), temperature source Oral, resp. rate (!) 24, SpO2 97%.     Elderly frail male PPM under left clavicle SEM Lipoma over right chest Post sternotomy Abdomen benign No edema Foley with blood tinged urine  being irrigated   Labs:   Lab Results  Component Value Date   WBC 20.7 (H) 12/19/2022   HGB 7.0 (L) 12/19/2022   HCT 23.9 (L) 12/19/2022   MCV 79.1 (L) 12/19/2022   PLT 143 (L) 12/19/2022    Recent Labs  Lab 12/18/22 1128 12/19/22 0328  NA 139 136  K 5.0 4.1  CL 110 111  CO2 17* 19*  BUN 21 25*  CREATININE 1.25* 1.17  CALCIUM 8.2* 7.9*  PROT 5.6*  --   BILITOT 0.3  --   ALKPHOS 51  --   ALT 15  --   AST 17  --    GLUCOSE 101* 99   Lab Results  Component Value Date   TROPONINI 4.76 (HH) 04/14/2017    Lab Results  Component Value Date   CHOL 61 12/01/2022   CHOL 88 (L) 06/30/2021   CHOL 72 06/23/2020   Lab Results  Component Value Date   HDL 29 (L) 12/01/2022   HDL 35 (L) 06/30/2021   HDL 28 (L) 06/23/2020   Lab Results  Component Value Date   LDLCALC 23 12/01/2022   LDLCALC 38 06/30/2021   LDLCALC 30 06/23/2020   Lab Results  Component Value Date   TRIG 45 12/01/2022   TRIG 71 06/30/2021   TRIG 70 06/23/2020   Lab Results  Component Value Date   CHOLHDL 2.1 12/01/2022   CHOLHDL 2.5 06/30/2021   CHOLHDL 2.6 06/23/2020   No results found for: "LDLDIRECT"    Radiology: [facility] Chest Port 1 View  Result Date: 12/18/2022 CLINICAL DATA:  Dizziness. Hematuria. Possible sepsis. Hypotension. Short of breath. EXAM: PORTABLE CHEST 1 VIEW COMPARISON:  11/30/2022 FINDINGS: Previous median sternotomy and CABG. Dual lead pacemaker with leads in the region of the right atrium and right ventricle. The pulmonary vascularity is normal. The lungs are clear. No visible effusion. No acute bone finding. IMPRESSION: No active disease. Previous CABG. Dual lead pacemaker. Electronically Signed   By: Paulina Fusi M.D.   On: 12/18/2022 12:47   CARDIAC CATHETERIZATION  Result Date: 12/01/2022   Mid LM lesion is 50% stenosed.   Ost LAD to Prox LAD lesion is 100% stenosed.   Mid LAD-2 lesion is 20% stenosed.   Dist LAD lesion is 25% stenosed.   Ost Cx lesion is 40% stenosed.   Prox Cx lesion is 30% stenosed.   Mid Cx lesion is 75% stenosed.   Ost RCA to Dist RCA lesion is 100% stenosed.   Ost Ramus lesion is 95% stenosed.   Prox Graft to Dist Graft lesion between Ramus and 2nd Mrg  is 100% stenosed.   Origin to Mid Graft lesion before Prox LAD  is 100% stenosed.   Origin to Prox Graft lesion is 100% stenosed.   Ost 3rd Mrg to 3rd Mrg lesion is 99% stenosed.   Prox Graft lesion is 70% stenosed.   Non-stenotic Mid  LAD-1 lesion.   Non-stenotic Mid Graft to Dist Graft lesion was previously treated.   Non-stenotic Mid Graft lesion was previously treated.   Non-stenotic Mid Graft to Dist Graft lesion was previously treated.   LV end diastolic pressure is moderately elevated. Severe 3 vessel obstructive CAD Patent LIMA to the LAD Known occlusion of SVG to LCx Patent SVG to diagonal. Multiple prior stents. Within the proximal stent there is a focal 70% stenosis. Other stented segments look good. Occluded SVG to RCA. This is new. There are some left to right collaterals to distal  RCA Moderately elevated LVEDP 27 mm Hg Plan: SVG to RCA cannot be salvaged. This appears to be the culprit. Recommend continued medical therapy. If refractory angina could consider balloon angioplasty of in stent disease in SVG to diagonal but long term patency would be limited.   ECHOCARDIOGRAM COMPLETE  Result Date: 11/30/2022    ECHOCARDIOGRAM REPORT   Patient Name:   PURAV FLENER Boettner Date of Exam: 11/30/2022 Medical Rec #:  [id number]       Height:       67.0 in Accession #:    [id number]      Weight:       163.0 lb Date of Birth:  Jul 12, 1940       BSA:          1.854 m Patient Age:    35 years        BP:           139/66 mmHg Patient Gender: M               HR:           85 bpm. Exam Location:  Inpatient Procedure: 2D Echo, Cardiac Doppler, Color Doppler and Intracardiac            Opacification Agent Indications:    NSTEMI  History:        Patient has prior history of Echocardiogram examinations, most                 recent 06/22/2020. CAD, Pacemaker, PAD, Arrythmias:SSS; Risk                 Factors:Diabetes, Hypertension, Dyslipidemia and Sleep Apnea.                 Cancer.  Sonographer:    Milda Smart Referring Phys: [id number] SHENG L HALEY  Sonographer Comments: Image acquisition challenging due to patient body habitus. IMPRESSIONS  1. Left ventricular ejection fraction, by estimation, is 45 to 50%. The left ventricle has mildly decreased  function. The left ventricle demonstrates global hypokinesis. The left ventricular internal cavity size was mildly to moderately dilated. Left ventricular diastolic parameters are consistent with Grade II diastolic dysfunction (pseudonormalization).  2. Right ventricular systolic function is normal. The right ventricular size is normal. There is normal pulmonary artery systolic pressure.  3. Left atrial size was moderately dilated.  4. Right atrial size was moderately dilated.  5. The mitral valve is normal in structure. Trivial mitral valve regurgitation. No evidence of mitral stenosis.  6. The aortic valve is tricuspid. There is mild calcification of the aortic valve. There is mild thickening of the aortic valve. Aortic valve regurgitation is mild.  7. The inferior vena cava is normal in size with greater than 50% respiratory variability, suggesting right atrial pressure of 3 mmHg. Comparison(s): Changes from prior study are noted. Mild to moderately dilated LV with mildly reduced function but no apparent focal wall motion abnormalities. FINDINGS  Left Ventricle: Left ventricular ejection fraction, by estimation, is 45 to 50%. The left ventricle has mildly decreased function. The left ventricle demonstrates global hypokinesis. Definity contrast agent was given IV to delineate the left ventricular  endocardial borders. The left ventricular internal cavity size was mildly to moderately dilated. There is no left ventricular hypertrophy. Left ventricular diastolic parameters are consistent with Grade II diastolic dysfunction (pseudonormalization). Right Ventricle: The right ventricular size is normal. Right vetricular wall thickness was not well visualized. Right ventricular systolic function is normal. There is normal  pulmonary artery systolic pressure. The tricuspid regurgitant velocity is 2.78 m/s, and with an assumed right atrial pressure of 3 mmHg, the estimated right ventricular systolic pressure is 33.9 mmHg.  Left Atrium: Left atrial size was moderately dilated. Right Atrium: Right atrial size was moderately dilated. Pericardium: There is no evidence of pericardial effusion. Mitral Valve: The mitral valve is normal in structure. Trivial mitral valve regurgitation. No evidence of mitral valve stenosis. Tricuspid Valve: The tricuspid valve is normal in structure. Tricuspid valve regurgitation is trivial. No evidence of tricuspid stenosis. Aortic Valve: The aortic valve is tricuspid. There is mild calcification of the aortic valve. There is mild thickening of the aortic valve. Aortic valve regurgitation is mild. Aortic regurgitation PHT measures 605 msec. Pulmonic Valve: The pulmonic valve was grossly normal. Pulmonic valve regurgitation is trivial. No evidence of pulmonic stenosis. Aorta: The aortic root and ascending aorta are structurally normal, with no evidence of dilitation. Venous: The inferior vena cava is normal in size with greater than 50% respiratory variability, suggesting right atrial pressure of 3 mmHg. IAS/Shunts: The atrial septum is grossly normal. Additional Comments: A device lead is visualized.  LEFT VENTRICLE PLAX 2D LVIDd:         5.40 cm   Diastology LVIDs:         4.40 cm   LV e' medial:    4.68 cm/s LV PW:         1.00 cm   LV E/e' medial:  20.0 LV IVS:        1.10 cm   LV e' lateral:   7.51 cm/s LVOT diam:     2.30 cm   LV E/e' lateral: 12.5 LV SV:         90 LV SV Index:   49 LVOT Area:     4.15 cm  RIGHT VENTRICLE             IVC RV S prime:     11.10 cm/s  IVC diam: 2.00 cm TAPSE (M-mode): 1.9 cm LEFT ATRIUM             Index        RIGHT ATRIUM           Index LA diam:        5.30 cm 2.86 cm/m   RA Area:     23.20 cm LA Vol (A2C):   71.6 ml 38.62 ml/m  RA Volume:   69.10 ml  37.27 ml/m LA Vol (A4C):   74.9 ml 40.40 ml/m LA Biplane Vol: 76.7 ml 41.37 ml/m  AORTIC VALVE LVOT Vmax:   85.10 cm/s LVOT Vmean:  62.400 cm/s LVOT VTI:    0.217 m AI PHT:      605 msec  AORTA Ao Root diam: 3.40 cm  Ao Asc diam:  3.70 cm MITRAL VALVE                TRICUSPID VALVE MV Area (PHT): 3.51 cm     TR Peak grad:   30.9 mmHg MV Decel Time: 216 msec     TR Mean grad:   23.0 mmHg MR Peak grad: 29.8 mmHg     TR Vmax:        278.00 cm/s MR Vmax:      273.00 cm/s   TR Vmean:       231.0 cm/s MV E velocity: 93.60 cm/s MV A velocity: 105.00 cm/s  SHUNTS MV E/A ratio:  0.89  Systemic VTI:  0.22 m                             Systemic Diam: 2.30 cm Jodelle Red MD Electronically signed by Jodelle Red MD Signature Date/Time: 11/30/2022/4:57:31 PM    Final    DG Chest Portable 1 View  Result Date: 11/30/2022 CLINICAL DATA:  Chest pain EXAM: PORTABLE CHEST 1 VIEW COMPARISON:  X-ray 02/05/2022 FINDINGS: Sternal wires. Right upper chest battery pack with pacemaker leads along the right side of the heart are similar to previous. Minimal left basilar atelectasis. No consolidation, pneumothorax, effusion or edema. Calcified aorta. Overlapping cardiac leads. Degenerative changes of the spine and shoulders. IMPRESSION: Postop chest.  Pacemaker.  Minimal left basilar atelectasis or scar Electronically Signed   By: Karen Kays M.D.   On: 11/30/2022 10:12    EKG: A pacing no acute changes    ASSESSMENT AND PLAN:   CABG/CAD:  known occlusion of LCX SVG, recent occlusion SVG RCA not intervened on 70% stenosis in proximal stent to Diagonal and patent LIMA Like start 81 mg ASA when hematuria resolves Resume home lopressor at lower dose 50 mg BID has PPM back up, ranexa and imdur at lower dose 30 mg daily PAF:  in sinus with A pacing hold xarelto for now no need for heparin  CHF:  received iv lasix this am. EF 45-50% volume ok hold entresto for now  Hematuria:  per urology foley irrigated may need cystoscopy ok to proceed from cardiac perspective here or at [facility] Continue proscar and flomax DM:  continue Jardiance given cardiac issues  Signed: Charlton Haws 12/19/2022, 9:21 AM

## 2022-12-19 NOTE — Consult Note (Signed)
Urology Consult Note   Requesting Attending Physician:  Leroy Sea, MD Service Providing Consult: Urology  Consulting Attending: Dr. Margo Aye   Reason for Consult:  Hematuria  HPI: Robert Burns is seen in consultation for reasons noted above at the request of Leroy Sea, MD. patient was recently hospitalized from 8/22 through 8/24 for NSTEMI's, ultimately a result of occluded SVG to RCA which was not amenable to intervention.  Patient later went on to develop urinary retention.  It is unclear if hematuria was secondary to the retention or retention was from clot obstruction.  He was hospitalized from 8/29 through 9/3 for this reason at [facility].  He was started on Flomax and finasteride at this time.  He has been trying to schedule an appointment with [facility] urology for voiding trial.  He has remained on Xarelto and Plavix and has been undergoing workup for undifferentiated blood loss.  PMH significant for CAD, s/p CABG, s/p NSTEMI, A-fib, sick sinus syndrome s/p pacemaker, CLL, and BPH.  His Foley catheter was exchanged in the emergency department for three-way catheter and CBI was initiated.  I reviewed the plan with patient and his daughter by the phone.  He was alert, oriented, in no distress on assessment this morning.  He did not report ongoing hematuria at home.  He states that he has seen a urologist in the past though he cannot say specifically for what.  He reports that surgery was recommended but he did not receive cardiac clearance.  Presumably this is for bladder outlet obstruction 2/2 BPH.  He denies any knowledge of prostate cancer or radiation to the pelvis.  ------------------  Assessment:  82 y.o. male with BPH and hematuria on chronic blood thinners   Recommendations: #Hematuria  Continue Flomax and finasteride.  He will appreciate benefit from both of these medications in the long-term but not enough time has been provided for them to fully take  effect. Patient has not yet had cystoscopy.  His hematuria remains undifferentiated.  We can perform this on an outpatient basis. CT hematuria will be ordered for today Fortunately I was able to meet with cardiology in the room with the patient.  He is presently rate controlled and able to come off of all blood thinners for the time being.  I copiously hand irrigated patient on my arrival and no clot material was returned. Continue CBI with hand irrigation as necessary while his Plavix and Xarelto washout.  Cardiology reports he is appropriate for anesthesia if necessary.  We will proceed conservatively for the next 72 hours and then failing improvement on continuous bladder irrigation, will consider cystoscopy with fulguration. We will continue to follow along with you.  Please call with questions.  Case and plan discussed with Dr. Margo Aye  Past Medical History: Past Medical History:  Diagnosis Date   Anemia    Anxiety    Arthritis    "all over" (11/25/2015)   CAD (coronary artery disease)    a. s/p CABG  06/2002; b. 02/01/15 PCI: DES to prox SVG to PDA, staged PCI of SVG to Diag in 02/2015; c. 04/2017 Cath/PCI: LM nl, LAD 100ost, 40m, 75d, LCX 60ost, OM2 80, RCA 100ost, RPDA 80, LIMA->LAD ok, VG->D1 patent stent, VG->RPDA patent stent, 50p, VG->OM1->OM2 90p (3.0x24 Synergy DES), 100 between OM1->OM2 (med rx).   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 10/2002 L SFA PTA/BMS; b. 8/17 LE Angio: LEIA 90 (9x40 self exp stent), LSFA short segment prox occlusion (staged PTA/stenting 01/03/2016), patent mid stent, RSFA 56m (staged PTA/DEB  02/14/2016).   Presence of permanent cardiac pacemaker    Medtronic   Renal insufficiency 12/18/2022   SSS (sick sinus syndrome) (HCC)    a. s/p PPM in 2007 with gen change 04/2015 - Medtronic Adapta ADDRL1, ser # [id number] H.   Type II diabetes mellitus (HCC)    Type II   Urgency of urination     Past Surgical History:  Past Surgical History:  Procedure Laterality Date   CARDIAC CATHETERIZATION N/A 02/01/2015   Procedure: Left Heart Cath and Coronary Angiography;  Surgeon: Runell Gess, MD;  Location: [facility] INVASIVE CV LAB;  Service: Cardiovascular;  Laterality: N/A;   CARDIAC CATHETERIZATION N/A 02/01/2015   Procedure: Coronary Stent Intervention;  Surgeon: Runell Gess, MD;  Location: [facility] INVASIVE CV LAB;  Service: Cardiovascular;  Laterality: N/A;   CARDIAC CATHETERIZATION  06/2002   "just before bypass OR"   CARDIAC CATHETERIZATION N/A 03/01/2015   Procedure: Coronary Stent Intervention;  Surgeon: Runell Gess, MD;  Location: [facility] INVASIVE CV LAB;  Service: Cardiovascular;  Laterality: N/A;   CARDIAC CATHETERIZATION  02/06/2018   COLONOSCOPY     CORONARY ANGIOPLASTY     CORONARY ARTERY BYPASS GRAFT  06/2002   x5, LIMA-LAD;VG- Diag; seq VG- ramus & OM branch; VG-PDA   CORONARY STENT INTERVENTION N/A 04/12/2017   Procedure: CORONARY STENT INTERVENTION;  Surgeon: Runell Gess, MD;  Location: [facility] INVASIVE CV LAB;  Service: Cardiovascular;  Laterality: N/A;   CORONARY STENT INTERVENTION N/A 02/08/2018   Procedure: CORONARY STENT INTERVENTION;  Surgeon: Corky Crafts, MD;  Location: [facility] INVASIVE CV LAB;  Service: Cardiovascular;  Laterality: N/A;   CORONARY STENT INTERVENTION N/A 11/12/2018   Procedure: CORONARY STENT INTERVENTION;  Surgeon: Iran Ouch, MD;  Location: [facility] INVASIVE CV LAB;  Service: Cardiovascular;  Laterality: N/A;   CORONARY STENT INTERVENTION N/A 06/22/2020   Procedure: CORONARY STENT INTERVENTION;  Surgeon: Corky Crafts, MD;  Location: [facility] INVASIVE  CV LAB;  Service: Cardiovascular;  Laterality: N/A;   EP IMPLANTABLE DEVICE N/A 04/23/2015   Procedure: PPM Generator Changeout;  Surgeon: Thurmon Fair, MD;  Location: [facility] INVASIVE CV LAB;  Service: Cardiovascular;  Laterality: N/A;   FALSE ANEURYSM REPAIR Left 11/29/2018   Procedure: REPAIR FALSE ANEURYSM LEFT RADIAL ARTERY;  Surgeon: Larina Earthly, MD;  Location: [facility] OR;  Service: Vascular;  Laterality: Left;   FEMORAL ARTERY STENT Left ~ 2014   "taken out of my leg; couldn' catorgorize what kind so the put it under all 3"; cataroziepitheloid hemanioendotheliomau   FOOT FRACTURE SURGERY Left 1973   FRACTURE SURGERY     INSERT / REPLACE / REMOVE PACEMAKER  10/13/05   right side, medtronic Adapta   KNEE HARDWARE REMOVAL Right 1950's   "3-4 months after the insertion"   KNEE SURGERY Right 1950's   "broke my lower leg; had to put pin in my knee to keep lower leg in place til it healed"   LEFT HEART CATH AND CORS/GRAFTS ANGIOGRAPHY N/A 04/12/2017  Procedure: LEFT HEART CATH AND CORS/GRAFTS ANGIOGRAPHY;  Surgeon: Runell Gess, MD;  Location: [facility] INVASIVE CV LAB;  Service: Cardiovascular;  Laterality: N/A;   LEFT HEART CATH AND CORS/GRAFTS ANGIOGRAPHY N/A 02/06/2018   Procedure: LEFT HEART CATH AND CORS/GRAFTS ANGIOGRAPHY;  Surgeon: Lennette Bihari, MD;  Location: [facility] INVASIVE CV LAB;  Service: Cardiovascular;  Laterality: N/A;   LEFT HEART CATH AND CORS/GRAFTS ANGIOGRAPHY N/A 11/12/2018   Procedure: LEFT HEART CATH AND CORS/GRAFTS ANGIOGRAPHY;  Surgeon: Iran Ouch, MD;  Location: [facility] INVASIVE CV LAB;  Service: Cardiovascular;  Laterality: N/A;   LEFT HEART CATH AND CORS/GRAFTS ANGIOGRAPHY N/A 06/22/2020   Procedure: LEFT HEART CATH AND CORS/GRAFTS ANGIOGRAPHY;  Surgeon: Corky Crafts, MD;  Location: [facility] INVASIVE CV LAB;  Service: Cardiovascular;  Laterality: N/A;   LEFT HEART CATH AND CORS/GRAFTS ANGIOGRAPHY N/A 12/01/2022   Procedure: LEFT HEART CATH AND CORS/GRAFTS ANGIOGRAPHY;  Surgeon:  [place], Peter M, MD;  Location: [facility] INVASIVE CV LAB;  Service: Cardiovascular;  Laterality: N/A;   PERIPHERAL VASCULAR CATHETERIZATION N/A 11/25/2015   Procedure: Lower Extremity Angiography;  Surgeon: Runell Gess, MD;  Location: [facility] INVASIVE CV LAB;  Service: Cardiovascular;  Laterality: N/A;   PERIPHERAL VASCULAR CATHETERIZATION Left 11/25/2015   Procedure: Peripheral Vascular Intervention;  Surgeon: Runell Gess, MD;  Location: [facility] INVASIVE CV LAB;  Service: Cardiovascular;  Laterality: Left;  external iliac   PERIPHERAL VASCULAR CATHETERIZATION N/A 01/03/2016   Procedure: Lower Extremity Angiography;  Surgeon: Runell Gess, MD;  Location: [facility] INVASIVE CV LAB;  Service: Cardiovascular;  Laterality: N/A;   PERIPHERAL VASCULAR CATHETERIZATION Left 01/03/2016   Procedure: Peripheral Vascular Intervention;  Surgeon: Runell Gess, MD;  Location: [facility] INVASIVE CV LAB;  Service: Cardiovascular;  Laterality: Left;  SFA   PERIPHERAL VASCULAR CATHETERIZATION Right 02/14/2016   Procedure: Peripheral Vascular Atherectomy;  Surgeon: Runell Gess, MD;  Location: [facility] INVASIVE CV LAB;  Service: Cardiovascular;  Laterality: Right;  SFA   POPLITEAL ARTERY STENT  01/03/2016   Contralateral access with a 7 [name] crossover sheath (second order catheter placement)   TONSILLECTOMY AND ADENOIDECTOMY     TUMOR EXCISION Right ~ 2005   cancerous tumor removed from shoulder   TUMOR EXCISION Right ~ 2000   benign tumor removed from under shoulder   TUMOR EXCISION Left 06/02/2010   resection of Lt SFA wth interposition of Gore-Tex graft    Medication: Current Facility-Administered Medications  Medication Dose Route Frequency Provider Last Rate Last Admin   0.9 %  sodium chloride infusion   Intravenous Continuous Madelyn Flavors A, MD 75 mL/hr at 12/18/22 1618 New Bag at 12/18/22 1618   acetaminophen (TYLENOL) tablet 650 mg  650 mg Oral Q6H PRN Clydie Braun, MD       Or   acetaminophen (TYLENOL)  suppository 650 mg  650 mg Rectal Q6H PRN Madelyn Flavors A, MD       albuterol (PROVENTIL) (2.5 MG/3ML) 0.083% nebulizer solution 2.5 mg  2.5 mg Nebulization Q6H PRN Katrinka Blazing, Rondell A, MD       atorvastatin (LIPITOR) tablet 80 mg  80 mg Oral q1800 Smith, Rondell A, MD       cefTRIAXone (ROCEPHIN) 2 g in sodium chloride 0.9 % 100 mL IVPB  2 g Intravenous Q24H Smith, Rondell A, MD       Chlorhexidine Gluconate Cloth 2 % PADS 6 each  6 each Topical Daily Leroy Sea, MD       empagliflozin (JARDIANCE) tablet 10  mg  10 mg Oral Daily Smith, Rondell A, MD       ferric gluconate (FERRLECIT) 250 mg in sodium chloride 0.9 % 250 mL IVPB  250 mg Intravenous Daily Madelyn Flavors A, MD 135 mL/hr at 12/18/22 1823 250 mg at 12/18/22 1823   finasteride (PROSCAR) tablet 5 mg  5 mg Oral Daily Smith, Rondell A, MD       furosemide (LASIX) injection 20 mg  20 mg Intravenous Once Leroy Sea, MD       HYDROcodone-acetaminophen (NORCO/VICODIN) 5-325 MG per tablet 1 tablet  1 tablet Oral Q6H PRN Smith, Rondell A, MD       insulin aspart (novoLOG) injection 0-9 Units  0-9 Units Subcutaneous TID WC Smith, Rondell A, MD       melatonin tablet 5 mg  5 mg Oral QHS Smith, Rondell A, MD   5 mg at 12/18/22 2109   sodium chloride flush (NS) 0.9 % injection 3 mL  3 mL Intravenous Q12H Smith, Rondell A, MD   3 mL at 12/18/22 2109   sodium chloride irrigation 0.9 % 3,000 mL  3,000 mL Irrigation Continuous Ernie Avena, MD 0 mL/hr at 12/18/22 1455 3,000 mL at 12/18/22 2030   tamsulosin (FLOMAX) capsule 0.4 mg  0.4 mg Oral BID Clydie Braun, MD        Allergies: Allergies  Allergen Reactions   Peanut-Containing Drug Products Anaphylaxis and Other (See Comments)    Tongue swelling is severe    Ace Inhibitors Cough        Diltiazem Hcl Other (See Comments)    UNSPECIFIED REACTION     Meloxicam Other (See Comments)    GI upset    Doxycycline Other (See Comments) and Cough    Reaction of cough and runny nose    Eliquis [Apixaban] Other (See Comments)    dizziness   Sertraline Hcl Other (See Comments)   Carvedilol Itching   Clonidine Hcl Other (See Comments)    Patch only - skin irritation   Duloxetine Hcl Other (See Comments)    Urinary frequency    Social History: Social History   Tobacco Use   Smoking status: Former    Types: Pipe    Quit date: 1973    Years since quitting: 51.7   Smokeless tobacco: Never   Tobacco comments:    "quit smoking in 1973"  Vaping Use   Vaping status: Never Used  Substance Use Topics   Alcohol use: No   Drug use: No    Family History Family History  Problem Relation Age of Onset   Coronary artery disease Mother    Heart attack Mother    Hypertension Mother    Heart disease Mother        Open  Heart surgery   Diabetes Father    Heart disease Father    Hyperlipidemia Father    Heart attack Father    Hypertension Sister    Diabetes Sister    Heart disease Sister    Liver disease Neg Hx    Esophageal cancer Neg Hx    Colon cancer Neg Hx     Review of Systems  Genitourinary:  Positive for hematuria.     Objective   Vital signs in last 24 hours: BP (!) 116/48 (BP Location: Right Arm)   Pulse (!) 59   Temp 98 F (36.7 C) (Oral)   Resp (!) 24   SpO2 97%   Physical Exam General: NAD, A&O, resting,  appropriate HEENT: [place]/AT Pulmonary: Normal work of breathing Cardiovascular: RRR, no cyanosis Abdomen: Soft, NTTP, nondistended GU: Three-way catheter in place with pink lemonade colored irrigant.  No clot material present Neuro: Appropriate, no focal neurological deficits  Most Recent Labs: Lab Results  Component Value Date   WBC 20.7 (H) 12/19/2022   HGB 7.0 (L) 12/19/2022   HCT 23.9 (L) 12/19/2022   PLT 143 (L) 12/19/2022    Lab Results  Component Value Date   NA 136 12/19/2022   K 4.1 12/19/2022   CL 111 12/19/2022   CO2 19 (L) 12/19/2022   BUN 25 (H) 12/19/2022   CREATININE 1.17 12/19/2022   CALCIUM 7.9 (L)  12/19/2022   MG 2.1 02/15/2016    Lab Results  Component Value Date   INR 2.8 (H) 12/18/2022   APTT 39 (H) 12/18/2022     Urine Culture: @[id number] (laburin,org,[id number],[id number])@   IMAGING: DG Chest Port 1 View  Result Date: 12/18/2022 CLINICAL DATA:  Dizziness. Hematuria. Possible sepsis. Hypotension. Short of breath. EXAM: PORTABLE CHEST 1 VIEW COMPARISON:  11/30/2022 FINDINGS: Previous median sternotomy and CABG. Dual lead pacemaker with leads in the region of the right atrium and right ventricle. The pulmonary vascularity is normal. The lungs are clear. No visible effusion. No acute bone finding. IMPRESSION: No active disease. Previous CABG. Dual lead pacemaker. Electronically Signed   By: Paulina Fusi M.D.   On: 12/18/2022 12:47    ------  Elmon Kirschner, NP Pager: [phone]   Please contact the urology consult pager with any further questions/concerns.

## 2022-12-19 NOTE — Evaluation (Signed)
Occupational Therapy Evaluation Patient Details Name: Robert Burns MRN: [id number] DOB: September 23, 1940 Today's Date: 12/19/2022   History of Present Illness 82 y.o. male who presents 12/18/22 with complaints of blood in urine. +UTI, sepsis, hypotension, PMH: extensive CAD s/p CABG, 15-16 prior stents, HLD, HTN, DM2, OSA, PAD, PAF, SSS s/p PPM, CLL   Clinical Impression   PTA pt lives at home independently with his wife. Pt states he was having problems with his L knee and hip due to "sciatica" and was at the orthopedics office when he was sent to the hospital. Pt receiving blood at time of evaluation. Pt overall S with mobility @ RW level and Min A with LB ADL. Acute OT will follow to maximize functional independence to facilitate safe DC home. Do not feel he will need OT follow up after DC. VSS during session. Pt very appreciative.       If plan is discharge home, recommend the following: A little help with bathing/dressing/bathroom;Assistance with cooking/housework;Assist for transportation    Functional Status Assessment  Patient has had a recent decline in their functional status and demonstrates the ability to make significant improvements in function in a reasonable and predictable amount of time.  Equipment Recommendations  None recommended by OT    Recommendations for Other Services       Precautions / Restrictions Precautions Precautions: Other (comment);Fall (3 way foley catheter/CBI)      Mobility Bed Mobility               General bed mobility comments: OOB in chair    Transfers Overall transfer level: Needs assistance Equipment used: Rolling walker (2 wheels) Transfers: Sit to/from Stand Sit to Stand: Supervision                  Balance Overall balance assessment: No apparent balance deficits (not formally assessed)    Has been using RW at home lately                                     ADL either performed or assessed with  clinical judgement   ADL Overall ADL's : Needs assistance/impaired Eating/Feeding: Independent   Grooming: Set up;Sitting   Upper Body Bathing: Set up;Sitting   Lower Body Bathing: Minimal assistance Lower Body Bathing Details (indicate cue type and reason): limited by catheter discomfort Upper Body Dressing : Set up   Lower Body Dressing: Minimal assistance Lower Body Dressing Details (indicate cue type and reason): limited due to catheter disocomfort Toilet Transfer: Supervision/safety     Toileting - Clothing Manipulation Details (indicate cue type and reason): foley; S with pericare     Functional mobility during ADLs: Supervision/safety;Rolling walker (2 wheels)  Discussed need to use shower chair after DC     Vision Baseline Vision/History: 1 Wears glasses       Perception         Praxis         Pertinent Vitals/Pain Pain Assessment Pain Assessment: Faces Faces Pain Scale: Hurts a little bit Pain Location: bottom Pain Descriptors / Indicators: Discomfort Pain Intervention(s): Limited activity within patient's tolerance     Extremity/Trunk Assessment Upper Extremity Assessment Upper Extremity Assessment: Generalized weakness   Lower Extremity Assessment Lower Extremity Assessment: Defer to PT evaluation   Cervical / Trunk Assessment Cervical / Trunk Assessment: Normal;Other exceptions (hx of "sciatica")   Communication Communication Communication: No apparent difficulties   Cognition  Arousal: Alert Behavior During Therapy: WFL for tasks assessed/performed Overall Cognitive Status: Within Functional Limits for tasks assessed                                       General Comments       Exercises Exercises: Other exercises Other Exercises Other Exercises: chair marching x 20 Other Exercises: encouraged general seated level exercise - discussed importance of mobility   Shoulder Instructions      Home Living Family/patient  expects to be discharged to:: Private residence Living Arrangements: Spouse/significant other Available Help at Discharge: Family;Available 24 hours/day Type of Home: House Home Access: Stairs to enter [employer] of Steps: 2   Home Layout: Multi-level;Laundry or work area in basement;Able to live on main level with bedroom/bathroom Alternate Teacher, music of Steps: flight of steps with handrail; descends steps backwards   Bathroom Shower/Tub: Producer, television/film/video: Standard Bathroom Accessibility: No   Home Equipment: Agricultural consultant (2 wheels);Cane - single point;BSC/3in1;Shower seat          Prior Functioning/Environment Prior Level of Function : Independent/Modified Independent             Mobility Comments: Has been usin gthe RW for 4-5 weeks due to L knee and hip pain due to "sciatica" - had an ortho appointment where BP was low adn was admitted to the hospital ADLs Comments: Could do his own self care; participates in IADL task        OT Problem List: Decreased strength;Decreased activity tolerance;Decreased knowledge of use of DME or AE      OT Treatment/Interventions: Self-care/ADL training;Therapeutic exercise;Energy conservation;DME and/or AE instruction;Therapeutic activities;Patient/family education    OT Goals(Current goals can be found in the care plan section) Acute Rehab OT Goals Patient Stated Goal: to get better and go home OT Goal Formulation: With patient Time For Goal Achievement: 01/02/23 Potential to Achieve Goals: Good  OT Frequency: Min 1X/week    Co-evaluation              AM-PAC OT "6 Clicks" Daily Activity     Outcome Measure Help from another person eating meals?: None Help from another person taking care of personal grooming?: A Little Help from another person toileting, which includes using toliet, bedpan, or urinal?: A Little Help from another person bathing (including washing, rinsing, drying)?: A  Little Help from another person to put on and taking off regular upper body clothing?: A Little Help from another person to put on and taking off regular lower body clothing?: A Little 6 Click Score: 19   End of Session Equipment Utilized During Treatment: Rolling walker (2 wheels) Nurse Communication: Mobility status  Activity Tolerance: Patient tolerated treatment well Patient left: in chair;with call bell/phone within reach  OT Visit Diagnosis: Unsteadiness on feet (R26.81);Muscle weakness (generalized) (M62.81)                Time: 2536-6440 OT Time Calculation (min): 22 min Charges:  OT General Charges $OT Visit: 1 Visit OT Evaluation $OT Eval Moderate Complexity: 1 Mod  Oyinkansola Truax, OT/L   Acute OT Clinical Specialist Acute Rehabilitation Services Pager [phone] Office [phone]   [facility] 12/19/2022, 1:30 PM

## 2022-12-19 NOTE — Plan of Care (Signed)
  Problem: Activity: Goal: Ability to tolerate increased activity will improve Outcome: Progressing   Problem: Cardiac: Goal: Ability to achieve and maintain adequate cardiovascular perfusion will improve Outcome: Progressing   Problem: Health Behavior/Discharge Planning: Goal: Ability to safely manage health-related needs after discharge will improve Outcome: Progressing   Problem: Education: Goal: Understanding of CV disease, CV risk reduction, and recovery process will improve Outcome: Progressing   Problem: Education: Goal: Knowledge of General Education information will improve Description: Including pain rating scale, medication(s)/side effects and non-pharmacologic comfort measures Outcome: Progressing   Problem: Clinical Measurements: Goal: Cardiovascular complication will be avoided Outcome: Progressing

## 2022-12-19 NOTE — Progress Notes (Signed)
PROGRESS NOTE                                                                                                                                                                                                             Patient Demographics:    Robert Burns, is a 82 y.o. male, DOB - 04-27-1940, HBZ:[id number]  Outpatient Primary MD for the patient is Robert Ates, MD    LOS - 1  Admit date - 12/18/2022    Chief Complaint  Patient presents with   Dizziness   Hematuria       Brief Narrative (HPI from H&P)   82 y.o. male with medical history significant of hypertension, hyperlipidemia, CAD s/p CABG with multiple PCI's, PAF, sick sinus syndrome s/p pacemaker, CLL, and BPH with urinary retention who presents with complaints of blood in urine.  He had just recently been hospitalized 8/22-8/24 with NSTEMI high-sensitivity troponins elevated up to 931.    Left heart cath showed occluded SVG to RCA, but was unamenable to intervention and medical management was recommended, he was discharged from the hospital on 12/02/2022, subsequently patient developed urinary retention was hospitalized at [facility] 12/06/2021 he had Foley catheter placed and discharged home on 12/12/2021 with Foley catheter, Xarelto, Plavix and ongoing hematuria.  Subsequently saw his cardiologist and at that time Plavix was discontinued, unfortunately he continued to have hematuria and started developing weakness and dizziness presented to the hospital with severe hypotension, worsening anemia and hematuria and was admitted.   Subjective:    Robert Burns today has, No headache, No chest pain, No abdominal pain - No Nausea, No new weakness tingling or numbness, no SOB   Assessment  & Plan :   Sepsis due to UTI in the presence of indwelling Foley catheter present on admission, ongoing hematuria present on admission - he has been seen by urology, currently having  three-way bladder irrigation, Plavix was discontinued a week ago by cardiology, Xarelto currently on hold.  Foley catheter indwelling which was present on admission being continued, on Flomax, 2 units of packed RBC transfusion on 12/19/2022.  On IV antibiotics with improving sepsis pathophysiology, continue to monitor cultures, CBC, appreciate urology input.  Acute blood loss anemia underlying history of CLL.  Anemia panel pending 2 units of packed RBC on 12/19/2022 blood loss plan as in #1  above.  Chronic CLL follows with Dr. Leonides Burns.  Hypotension, prerenal azotemia due to sepsis and anemia, AKI.  All improving with IV fluids, transfusion and antibiotics.  Continue to monitor.  AKI almost resolved.  CAD s/p CABG and multiple PCI's.  Plavix recently discontinued, chest pain-free, appreciate cardiology follow-up.  No acute issues.  Try to keep hemoglobin above 8.  Statin continued, beta-blocker as tolerated by blood pressure.  No antiplatelets due to severe hematuria.  Paroxysmal atrial fibrillation, sick sinus syndrome s/p pacemaker.  [place] vas 2 score of greater than 3.  Beta-blocker.  Xarelto on hold, cardiology on board.  Dyslipidemia.  On statin.  DM type II.  Oral glipizide and metformin held.  SSI.  Lab Results  Component Value Date   HGBA1C 6.6 (H) 11/30/2022   CBG (last 3)  Recent Labs    12/18/22 1123 12/18/22 2246 12/19/22 0923  GLUCAP 94 136* 116*         Condition -  Guarded  Family Communication  :  daughter Robert Burns [phone]  on 12/19/2022  Code Status : Full code  Consults  : Cardiology, urology  PUD Prophylaxis : PPI   Procedures  :           Disposition Plan  :    Status is: Inpatient   DVT Prophylaxis  :    SCDs Start: 12/18/22 1524  Lab Results  Component Value Date   PLT 143 (L) 12/19/2022    Diet :  Diet Order             Diet heart healthy/carb modified Room service appropriate? Yes; Fluid consistency: Thin  Diet effective now                     Inpatient Medications  Scheduled Meds:  atorvastatin  80 mg Oral q1800   Chlorhexidine Gluconate Cloth  6 each Topical Daily   empagliflozin  10 mg Oral Daily   finasteride  5 mg Oral Daily   insulin aspart  0-9 Units Subcutaneous TID WC   isosorbide mononitrate  30 mg Oral Daily   melatonin  5 mg Oral QHS   metoprolol tartrate  25 mg Oral BID   ranolazine  500 mg Oral BID   sodium chloride flush  3 mL Intravenous Q12H   tamsulosin  0.4 mg Oral BID   Continuous Infusions:  sodium chloride 75 mL/hr at 12/19/22 0944   cefTRIAXone (ROCEPHIN)  IV     ferric gluconate (FERRLECIT) IVPB 250 mg (12/18/22 1823)   sodium chloride irrigation 0 mL (12/18/22 1455)   PRN Meds:.acetaminophen **OR** acetaminophen, albuterol, HYDROcodone-acetaminophen    Objective:   Vitals:   12/19/22 0628 12/19/22 0643 12/19/22 0800 12/19/22 0933  BP: (!) 99/43 (!) 103/41 (!) 116/48 104/62  Pulse: 60 60 (!) 59 60  Resp: 19 20 (!) 24 19  Temp:  98.3 F (36.8 C) 98 F (36.7 C)   TempSrc:  Oral Oral   SpO2:  97% 97% 99%    Wt Readings from Last 3 Encounters:  12/14/22 74.8 kg  12/13/22 75.7 kg  12/07/22 77.1 kg     Intake/Output Summary (Last 24 hours) at 12/19/2022 0956 Last data filed at 12/19/2022 1914 Gross per 24 hour  Intake 24138.75 ml  Output 78295 ml  Net 6763.75 ml     Physical Exam  Awake Alert, No new F.N deficits, Normal affect, Foley with bloody urine Robert Burns,PERRAL Supple Neck, No JVD,   Symmetrical Chest wall movement,  Good air movement bilaterally, CTAB RRR,No Gallops,Rubs or new Murmurs,  +ve B.Sounds, Abd Soft, No tenderness,   No Cyanosis, Clubbing or edema      Data Review:    Recent Labs  Lab 12/14/22 1100 12/18/22 1128 12/18/22 1600 12/18/22 2131 12/19/22 0328  WBC 24.3* 23.2*  --   --  20.7*  HGB 10.4* 9.3* 8.3* 7.7* 7.0*  HCT 34.6* 32.7* 29.1* 26.7* 23.9*  PLT 233 180  --   --  143*  MCV 79.4* 84.3  --   --  79.1*  MCH 23.9* 24.0*   --   --  23.2*  MCHC 30.1 28.4*  --   --  29.3*  RDW 22.6* 23.1*  --   --  23.0*  LYMPHSABS 15.7* 10.7*  --   --   --   MONOABS 0.8 0.2  --   --   --   EOSABS 0.1 0.0  --   --   --   BASOSABS 0.1 0.0  --   --   --     Recent Labs  Lab 12/14/22 1100 12/18/22 1128 12/18/22 1148 12/18/22 1346 12/19/22 0328  NA 144 139  --   --  136  K 4.5 5.0  --   --  4.1  CL 110 110  --   --  111  CO2 27 17*  --   --  19*  ANIONGAP 7 12  --   --  6  GLUCOSE 141* 101*  --   --  99  BUN 15 21  --   --  25*  CREATININE 1.03 1.25*  --   --  1.17  AST 12* 17  --   --   --   ALT 14 15  --   --   --   ALKPHOS 63 51  --   --   --   BILITOT 0.6 0.3  --   --   --   ALBUMIN 3.9 3.1*  --   --   --   LATICACIDVEN  --   --  3.3* 3.1*  --   INR  --  2.8*  --   --   --   CALCIUM 9.2 8.2*  --   --  7.9*    Micro Results Recent Results (from the past 240 hour(s))  Blood Culture (routine x 2)     Status: None (Preliminary result)   Collection Time: 12/18/22 11:28 AM   Specimen: BLOOD  Result Value Ref Range Status   Specimen Description BLOOD RIGHT ANTECUBITAL  Final   Special Requests   Final    BOTTLES DRAWN AEROBIC ONLY Blood Culture results may not be optimal due to an excessive volume of blood received in culture bottles   Culture   Final    NO GROWTH < 24 HOURS Performed at [facility], [address] [place], [place] [address]    Report Status PENDING  Incomplete  Blood Culture (routine x 2)     Status: None (Preliminary result)   Collection Time: 12/18/22 12:00 PM   Specimen: BLOOD LEFT HAND  Result Value Ref Range Status   Specimen Description BLOOD LEFT HAND  Final   Special Requests   Final    BOTTLES DRAWN AEROBIC ONLY Blood Culture adequate volume   Culture   Final    NO GROWTH < 24 HOURS Performed at [facility], [address] [place], [place] [address]    Report  Status PENDING  Incomplete    Radiology Reports DG Chest Port 1 View  Result Date:  12/18/2022 CLINICAL DATA:  Dizziness. Hematuria. Possible sepsis. Hypotension. Short of breath. EXAM: PORTABLE CHEST 1 VIEW COMPARISON:  11/30/2022 FINDINGS: Previous median sternotomy and CABG. Dual lead pacemaker with leads in the region of the right atrium and right ventricle. The pulmonary vascularity is normal. The lungs are clear. No visible effusion. No acute bone finding. IMPRESSION: No active disease. Previous CABG. Dual lead pacemaker. Electronically Signed   By: Paulina Fusi M.D.   On: 12/18/2022 12:47      Signature  -   Susa Raring M.D on 12/19/2022 at 9:56 AM   -  To page go to [email or web address]

## 2022-12-19 NOTE — Progress Notes (Signed)
PT Cancellation Note  Patient Details Name: Robert Burns MRN: [id number] DOB: Apr 17, 1940   Cancelled Treatment:    Reason Eval/Treat Not Completed: Fatigue/lethargy limiting ability to participate  Patient just back to bed with nursing assist after sitting up for several hours. Reasonably asked for PT to return at a later time. Will see as schedule permits.   Jerolyn Center, PT Acute Rehabilitation Services  Office [phone]  Zena Amos 12/19/2022, 2:24 PM

## 2022-12-20 ENCOUNTER — Inpatient Hospital Stay (HOSPITAL_COMMUNITY): Payer: Medicare HMO

## 2022-12-20 DIAGNOSIS — Z951 Presence of aortocoronary bypass graft: Secondary | ICD-10-CM | POA: Diagnosis not present

## 2022-12-20 DIAGNOSIS — Z5181 Encounter for therapeutic drug level monitoring: Secondary | ICD-10-CM | POA: Diagnosis not present

## 2022-12-20 DIAGNOSIS — I2510 Atherosclerotic heart disease of native coronary artery without angina pectoris: Secondary | ICD-10-CM | POA: Diagnosis not present

## 2022-12-20 DIAGNOSIS — Z950 Presence of cardiac pacemaker: Secondary | ICD-10-CM | POA: Diagnosis not present

## 2022-12-20 DIAGNOSIS — A419 Sepsis, unspecified organism: Secondary | ICD-10-CM | POA: Diagnosis not present

## 2022-12-20 DIAGNOSIS — R6520 Severe sepsis without septic shock: Secondary | ICD-10-CM | POA: Diagnosis not present

## 2022-12-20 DIAGNOSIS — I480 Paroxysmal atrial fibrillation: Secondary | ICD-10-CM | POA: Diagnosis not present

## 2022-12-20 DIAGNOSIS — Z7901 Long term (current) use of anticoagulants: Secondary | ICD-10-CM | POA: Diagnosis not present

## 2022-12-20 LAB — BPAM RBC
Blood Product Expiration Date: 202410012359
Blood Product Expiration Date: 202410022359
ISSUE DATE / TIME: 202409100624
ISSUE DATE / TIME: 202409101045
Unit Type and Rh: 5100
Unit Type and Rh: 5100

## 2022-12-20 LAB — C-REACTIVE PROTEIN: CRP: 1.7 mg/dL — ABNORMAL HIGH (ref <1.0)

## 2022-12-20 LAB — CBC WITH DIFFERENTIAL/PLATELET
Abs Immature Granulocytes: 0.05 10*3/uL (ref 0.00–0.07)
Basophils Absolute: 0.1 10*3/uL (ref 0.0–0.1)
Basophils Relative: 0 %
Eosinophils Absolute: 0.1 10*3/uL (ref 0.0–0.5)
Eosinophils Relative: 1 %
HCT: 28.4 % — ABNORMAL LOW (ref 39.0–52.0)
Hemoglobin: 8.8 g/dL — ABNORMAL LOW (ref 13.0–17.0)
Immature Granulocytes: 0 %
Lymphocytes Relative: 64 %
Lymphs Abs: 10.9 10*3/uL — ABNORMAL HIGH (ref 0.7–4.0)
MCH: 25.7 pg — ABNORMAL LOW (ref 26.0–34.0)
MCHC: 31 g/dL (ref 30.0–36.0)
MCV: 83 fL (ref 80.0–100.0)
Monocytes Absolute: 1.5 10*3/uL — ABNORMAL HIGH (ref 0.1–1.0)
Monocytes Relative: 9 %
Neutro Abs: 4.5 10*3/uL (ref 1.7–7.7)
Neutrophils Relative %: 26 %
Platelets: 127 10*3/uL — ABNORMAL LOW (ref 150–400)
RBC: 3.42 MIL/uL — ABNORMAL LOW (ref 4.22–5.81)
RDW: 21.5 % — ABNORMAL HIGH (ref 11.5–15.5)
WBC: 17 10*3/uL — ABNORMAL HIGH (ref 4.0–10.5)
nRBC: 0 % (ref 0.0–0.2)

## 2022-12-20 LAB — BASIC METABOLIC PANEL
Anion gap: 6 (ref 5–15)
BUN: 21 mg/dL (ref 8–23)
CO2: 20 mmol/L — ABNORMAL LOW (ref 22–32)
Calcium: 7.9 mg/dL — ABNORMAL LOW (ref 8.9–10.3)
Chloride: 112 mmol/L — ABNORMAL HIGH (ref 98–111)
Creatinine, Ser: 1.24 mg/dL (ref 0.61–1.24)
GFR, Estimated: 58 mL/min — ABNORMAL LOW (ref >60)
Glucose, Bld: 125 mg/dL — ABNORMAL HIGH (ref 70–99)
Potassium: 3.5 mmol/L (ref 3.5–5.1)
Sodium: 138 mmol/L (ref 135–145)

## 2022-12-20 LAB — GLUCOSE, CAPILLARY
Glucose-Capillary: 124 mg/dL — ABNORMAL HIGH (ref 70–99)
Glucose-Capillary: 215 mg/dL — ABNORMAL HIGH (ref 70–99)
Glucose-Capillary: 128 mg/dL — ABNORMAL HIGH (ref 70–99)
Glucose-Capillary: 139 mg/dL — ABNORMAL HIGH (ref 70–99)

## 2022-12-20 LAB — TYPE AND SCREEN
ABO/RH(D): O POS
Antibody Screen: NEGATIVE
Unit division: 0
Unit division: 0

## 2022-12-20 LAB — URINE CULTURE

## 2022-12-20 LAB — PROCALCITONIN: Procalcitonin: <0.1 ng/mL

## 2022-12-20 LAB — MAGNESIUM: Magnesium: 1.8 mg/dL (ref 1.7–2.4)

## 2022-12-20 LAB — BRAIN NATRIURETIC PEPTIDE: B Natriuretic Peptide: 277 pg/mL — ABNORMAL HIGH (ref 0.0–100.0)

## 2022-12-20 MED ORDER — LACTATED RINGERS IV BOLUS
500.0000 mL | Freq: Once | INTRAVENOUS | Status: AC
  Administered 2022-12-20: 500 mL via INTRAVENOUS

## 2022-12-20 MED ORDER — FUROSEMIDE 10 MG/ML IJ SOLN
20.0000 mg | Freq: Once | INTRAMUSCULAR | Status: AC
  Administered 2022-12-20: 20 mg via INTRAVENOUS
  Filled 2022-12-20: qty 2

## 2022-12-20 MED ORDER — IOHEXOL 350 MG/ML SOLN
100.0000 mL | Freq: Once | INTRAVENOUS | Status: AC | PRN
  Administered 2022-12-20: 100 mL via INTRAVENOUS

## 2022-12-20 MED ORDER — POTASSIUM CHLORIDE CRYS ER 20 MEQ PO TBCR
40.0000 meq | EXTENDED_RELEASE_TABLET | Freq: Once | ORAL | Status: AC
  Administered 2022-12-20: 40 meq via ORAL
  Filled 2022-12-20: qty 2

## 2022-12-20 MED ORDER — MAGNESIUM SULFATE 2 GM/50ML IV SOLN
2.0000 g | Freq: Once | INTRAVENOUS | Status: AC
  Administered 2022-12-20: 2 g via INTRAVENOUS
  Filled 2022-12-20: qty 50

## 2022-12-20 NOTE — Plan of Care (Signed)
Foley out today patient denies discomfort. Patient has not voided at this time.

## 2022-12-20 NOTE — Progress Notes (Signed)
Cardiologist:  Allyson Sabal  Subjective:  Denies SSCP, palpitations or Dyspnea Foley blood tinged with clots   Objective:  Vitals:   12/19/22 2300 12/20/22 0400 12/20/22 0800 12/20/22 0900  BP: 134/65 (!) 132/42 139/60 134/60  Pulse: 60 60 (!) 59 60  Resp: 14 12 18 16   Temp: 98.1 F (36.7 C) 98.5 F (36.9 C)    TempSrc: Oral Oral    SpO2: 99% 99%      Intake/Output from previous day:  Intake/Output Summary (Last 24 hours) at 12/20/2022 0945 Last data filed at 12/20/2022 0800 Gross per 24 hour  Intake 6508 ml  Output 40981 ml  Net -8567 ml    Physical Exam: Elderly frail male PPM under left clavicle SEM Lipoma over right chest Post sternotomy Abdomen benign No edema Foley with blood tinged urine being irrigated   Lab Results: Basic Metabolic Panel: Recent Labs    12/19/22 0328 12/20/22 0402  NA 136 138  K 4.1 3.5  CL 111 112*  CO2 19* 20*  GLUCOSE 99 125*  BUN 25* 21  CREATININE 1.17 1.24  CALCIUM 7.9* 7.9*  MG  --  1.8   Liver Function Tests: Recent Labs    12/18/22 1128  AST 17  ALT 15  ALKPHOS 51  BILITOT 0.3  PROT 5.6*  ALBUMIN 3.1*   No results for input(s): "LIPASE", "AMYLASE" in the last 72 hours. CBC: Recent Labs    12/18/22 1128 12/18/22 1600 12/19/22 1433 12/20/22 0402  WBC 23.2*   < > 19.2* 17.0*  NEUTROABS 12.3*  --   --  4.5  HGB 9.3*   < > 9.0* 8.8*  HCT 32.7*   < > 29.4* 28.4*  MCV 84.3   < > 81.7 83.0  PLT 180   < > 138* 127*   < > = values in this interval not displayed.    Anemia Panel: Recent Labs    12/19/22 1433 12/19/22 1438  VITAMINB12  --  508  FOLATE  --  10.3  FERRITIN  --  136  TIBC  --  258  IRON  --  218*  RETICCTPCT 2.2  --     Imaging: DG Chest Port 1 View  Result Date: 12/18/2022 CLINICAL DATA:  Dizziness. Hematuria. Possible sepsis. Hypotension. Short of breath. EXAM: PORTABLE CHEST 1 VIEW COMPARISON:  11/30/2022 FINDINGS: Previous median sternotomy and CABG. Dual lead pacemaker with leads in  the region of the right atrium and right ventricle. The pulmonary vascularity is normal. The lungs are clear. No visible effusion. No acute bone finding. IMPRESSION: No active disease. Previous CABG. Dual lead pacemaker. Electronically Signed   By: Paulina Fusi M.D.   On: 12/18/2022 12:47    Cardiac Studies:  ECG: A pacing no acute changes    Telemetry:  a pacing   Echo: EF 45-50% mild AR moderate bi atrial enlargement   Medications:    atorvastatin  80 mg Oral q1800   Chlorhexidine Gluconate Cloth  6 each Topical Daily   empagliflozin  10 mg Oral Daily   finasteride  5 mg Oral Daily   insulin aspart  0-9 Units Subcutaneous TID WC   isosorbide mononitrate  30 mg Oral Daily   melatonin  5 mg Oral QHS   metoprolol tartrate  25 mg Oral BID   pantoprazole  40 mg Oral Daily   ranolazine  500 mg Oral BID   sodium chloride flush  3 mL Intravenous Q12H   tamsulosin  0.4 mg  Oral BID      cefTRIAXone (ROCEPHIN)  IV 2 g (12/19/22 1419)   sodium chloride irrigation 0 mL (12/18/22 1455)    Assessment/Plan:  CABG/CAD:  known occlusion of LCX SVG, recent occlusion SVG RCA not intervened on 70% stenosis in proximal stent to Diagonal and patent LIMA Like start 81 mg ASA when hematuria resolves Resume home lopressor at lower dose 50 mg BID has PPM back up, ranexa and imdur at lower dose 30 mg daily PAF:  in sinus with A pacing hold xarelto for now no need for heparin  CHF:  received iv lasixx 1 . EF 45-50% volume ok hold entresto for now  Hematuria:  per urology foley irrigated may need cystoscopy ok to proceed from cardiac perspective here or at [facility] Continue proscar and flomax Covered with Rocephin Some bladder spasm clinically  DM:  continue Jardiance given cardiac issues CLL :  followed by Leonides Schanz PLT 127  Anemia:  post transfusion 2 unitis Hct 28.4  Charlton Haws 12/20/2022, 9:45 AM

## 2022-12-20 NOTE — Consult Note (Signed)
   [facility]  [facility] Inpatient Consult   12/20/2022  Kieren Gordin [place] 1940-12-16 [id number]  [facility] Liaison met patient at bedside at [facility]. Patient was in the midst of nursing care and family member at bedside. Will follow up when more appropriate.  Insurance: [employer]   The patient was screened for 30 day readmission hospitalization with noted high risk score for unplanned readmission risk with 3 admission in 6 months.  Primary Care Provider: Thana Ates, MD with Deboraha Sprang at [place]  The patient was assessed for potential [facility]) Care Management service needs for post hospital transition for care coordination. Review of patient's electronic medical record reveals patient is recently from [facility] noted.  Plan: [facility] Liaison will continue to follow progress and disposition to asess for post hospital community care coordination/management needs.  Referral request for community care coordination: Anticipate follow up care coordination with [facility] team. No further needs at this time.   [facility] Care Management/Population Health does not replace or interfere with any arrangements made by the Inpatient Transition of Care team.   For questions contact:   Charlesetta Shanks, RN, BSN, CCM [place]  [facility], [facility] Liaison Direct Dial: [phone] or secure chat Website: [email or web address]

## 2022-12-20 NOTE — TOC CM/SW Note (Signed)
Transition of Care [facility]) - Inpatient Brief Assessment   Patient Details  Name: [name] MRN: [id number] Date of Birth: 1941-02-15  Transition of Care [facility]) CM/SW Contact:    Mearl Latin, LCSW Phone Number: 12/20/2022, 3:34 PM   Clinical Narrative: Patient admitted from home with spouse currently undergoing workup for sepsis. No current TOC needs identified at this time. Please place consult if needs arise.    Transition of Care Asessment: Insurance and Status: Insurance coverage has been reviewed Patient has primary care physician: Yes Home environment has been reviewed: From home Prior level of function:: Independent Prior/Current Home Services: No current home services Social Determinants of Health Reivew: SDOH reviewed no interventions necessary Readmission risk has been reviewed: Yes Transition of care needs: no transition of care needs at this time

## 2022-12-20 NOTE — Progress Notes (Signed)
Patient voided for the second time small bloody pink urine. Less than 10ml.

## 2022-12-20 NOTE — Progress Notes (Signed)
PROGRESS NOTE                                                                                                                                                                                                             Patient Demographics:    Robert Burns, is a 82 y.o. male, DOB - December 07, 1940, QMV:[id number]  Outpatient Primary MD for the patient is Thana Ates, MD    LOS - 2  Admit date - 12/18/2022    Chief Complaint  Patient presents with   Dizziness   Hematuria       Brief Narrative (HPI from H&P)   82 y.o. male with medical history significant of hypertension, hyperlipidemia, CAD s/p CABG with multiple PCI's, PAF, sick sinus syndrome s/p pacemaker, CLL, and BPH with urinary retention who presents with complaints of blood in urine.  He had just recently been hospitalized 8/22-8/24 with NSTEMI high-sensitivity troponins elevated up to 931.    Left heart cath showed occluded SVG to RCA, but was unamenable to intervention and medical management was recommended, he was discharged from the hospital on 12/02/2022, subsequently patient developed urinary retention was hospitalized at [facility] health 12/06/2021 he had Foley catheter placed and discharged home on 12/12/2021 with Foley catheter, Xarelto, Plavix and ongoing hematuria.  Subsequently saw his cardiologist and at that time Plavix was discontinued, unfortunately he continued to have hematuria and started developing weakness and dizziness presented to the hospital with severe hypotension, worsening anemia and hematuria and was admitted.   Subjective:   Patient in bed, appears comfortable, denies any headache, no fever, no chest pain or pressure, no shortness of breath , no abdominal pain. No new focal weakness.   Assessment  & Plan :   Sepsis due to UTI in the presence of indwelling Foley catheter present on admission, ongoing hematuria present on admission - he has been seen  by urology, currently having three-way bladder irrigation, urine getting clear on 12/20/2022, Plavix was discontinued a week ago by cardiology, Xarelto currently on hold.  Foley catheter indwelling which was present on admission being continued, on Flomax, 2 units of packed RBC transfusion on 12/19/2022.  On IV antibiotics with improving sepsis pathophysiology, continue to monitor cultures, CBC, appreciate urology input.  Acute blood loss anemia underlying history of CLL.  Anemia panel pending 2 units of packed RBC  on 12/19/2022 blood loss plan as in #1 above.  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  Hypotension, prerenal azotemia due to sepsis and anemia, AKI.  All improving with IV fluids, transfusion and antibiotics.  Continue to monitor.  AKI almost resolved.  CAD s/p CABG and multiple PCI's.  Plavix recently discontinued, chest pain-free, appreciate cardiology follow-up.  No acute issues.  Try to keep hemoglobin above 8.  Statin continued, beta-blocker as tolerated by blood pressure.  No antiplatelets due to severe hematuria.  Paroxysmal atrial fibrillation, sick sinus syndrome s/p pacemaker.  [place] vas 2 score of greater than 3.  Beta-blocker. Xarelto on hold, cardiology on board.  If CBC remains stable might challenge him with prophylactic heparin for a few days on 12/21/2022, thereafter resume Xarelto once cleared by cardiology.  Dyslipidemia.  On statin.  DM type II.  Oral glipizide and metformin held.  SSI.  Lab Results  Component Value Date   HGBA1C 6.6 (H) 11/30/2022   CBG (last 3)  Recent Labs    12/19/22 1801 12/19/22 2112 12/20/22 0843  GLUCAP 126* 143* 124*         Condition -  Guarded  Family Communication  :  daughter Alvis Lemmings [phone]  on 12/19/2022, 12/20/22  Code Status : Full code  Consults  : Cardiology, urology  PUD Prophylaxis : PPI   Procedures  :           Disposition Plan  :    Status is: Inpatient   DVT Prophylaxis  :    SCDs Start: 12/18/22  1524  Lab Results  Component Value Date   PLT 127 (L) 12/20/2022    Diet :  Diet Order             Diet heart healthy/carb modified Room service appropriate? Yes; Fluid consistency: Thin  Diet effective now                    Inpatient Medications  Scheduled Meds:  atorvastatin  80 mg Oral q1800   Chlorhexidine Gluconate Cloth  6 each Topical Daily   empagliflozin  10 mg Oral Daily   finasteride  5 mg Oral Daily   insulin aspart  0-9 Units Subcutaneous TID WC   isosorbide mononitrate  30 mg Oral Daily   melatonin  5 mg Oral QHS   metoprolol tartrate  25 mg Oral BID   pantoprazole  40 mg Oral Daily   ranolazine  500 mg Oral BID   sodium chloride flush  3 mL Intravenous Q12H   tamsulosin  0.4 mg Oral BID   Continuous Infusions:  cefTRIAXone (ROCEPHIN)  IV 2 g (12/19/22 1419)   sodium chloride irrigation 0 mL (12/18/22 1455)   PRN Meds:.acetaminophen **OR** acetaminophen, albuterol, HYDROcodone-acetaminophen    Objective:   Vitals:   12/19/22 2300 12/20/22 0400 12/20/22 0800 12/20/22 0900  BP: 134/65 (!) 132/42 139/60 134/60  Pulse: 60 60 (!) 59 60  Resp: 14 12 18 16   Temp: 98.1 F (36.7 C) 98.5 F (36.9 C)    TempSrc: Oral Oral    SpO2: 99% 99%      Wt Readings from Last 3 Encounters:  12/14/22 74.8 kg  12/13/22 75.7 kg  12/07/22 77.1 kg     Intake/Output Summary (Last 24 hours) at 12/20/2022 0943 Last data filed at 12/20/2022 0800 Gross per 24 hour  Intake 6508 ml  Output 09811 ml  Net -8567 ml     Physical Exam  Awake Alert, No  new F.N deficits, Normal affect, Foley with bloody urine [place].AT,PERRAL Supple Neck, No JVD,   Symmetrical Chest wall movement, Good air movement bilaterally, CTAB RRR,No Gallops,Rubs or new Murmurs,  +ve B.Sounds, Abd Soft, No tenderness,   No Cyanosis, Clubbing or edema     Data Review:    Recent Labs  Lab 12/14/22 1100 12/14/22 1100 12/18/22 1128 12/18/22 1600 12/18/22 2131 12/19/22 0328 12/19/22 1433  12/20/22 0402  WBC 24.3*  --  23.2*  --   --  20.7* 19.2* 17.0*  HGB 10.4*   < > 9.3* 8.3* 7.7* 7.0* 9.0* 8.8*  HCT 34.6*  --  32.7* 29.1* 26.7* 23.9* 29.4* 28.4*  PLT 233  --  180  --   --  143* 138* 127*  MCV 79.4*  --  84.3  --   --  79.1* 81.7 83.0  MCH 23.9*  --  24.0*  --   --  23.2* 25.0* 25.7*  MCHC 30.1  --  28.4*  --   --  29.3* 30.6 31.0  RDW 22.6*  --  23.1*  --   --  23.0* 21.1* 21.5*  LYMPHSABS 15.7*  --  10.7*  --   --   --   --  10.9*  MONOABS 0.8  --  0.2  --   --   --   --  1.5*  EOSABS 0.1  --  0.0  --   --   --   --  0.1  BASOSABS 0.1  --  0.0  --   --   --   --  0.1   < > = values in this interval not displayed.    Recent Labs  Lab 12/14/22 1100 12/18/22 1128 12/18/22 1148 12/18/22 1346 12/19/22 0328 12/19/22 1438 12/20/22 0402  NA 144 139  --   --  136  --  138  K 4.5 5.0  --   --  4.1  --  3.5  CL 110 110  --   --  111  --  112*  CO2 27 17*  --   --  19*  --  20*  ANIONGAP 7 12  --   --  6  --  6  GLUCOSE 141* 101*  --   --  99  --  125*  BUN 15 21  --   --  25*  --  21  CREATININE 1.03 1.25*  --   --  1.17  --  1.24  AST 12* 17  --   --   --   --   --   ALT 14 15  --   --   --   --   --   ALKPHOS 63 51  --   --   --   --   --   BILITOT 0.6 0.3  --   --   --   --   --   ALBUMIN 3.9 3.1*  --   --   --   --   --   CRP  --   --   --   --   --  1.5* 1.7*  PROCALCITON  --   --   --   --   --  <0.10 <0.10  LATICACIDVEN  --   --  3.3* 3.1*  --   --   --   INR  --  2.8*  --   --   --   --   --  BNP  --   --   --   --  234.3*  --  277.0*  MG  --   --   --   --   --   --  1.8  CALCIUM 9.2 8.2*  --   --  7.9*  --  7.9*    Micro Results Recent Results (from the past 240 hour(s))  Blood Culture (routine x 2)     Status: None (Preliminary result)   Collection Time: 12/18/22 11:28 AM   Specimen: BLOOD  Result Value Ref Range Status   Specimen Description BLOOD RIGHT ANTECUBITAL  Final   Special Requests   Final    BOTTLES DRAWN AEROBIC ONLY Blood  Culture results may not be optimal due to an excessive volume of blood received in culture bottles   Culture   Final    NO GROWTH 2 DAYS Performed at [facility], [address], [place], [place] [address]    Report Status PENDING  Incomplete  Blood Culture (routine x 2)     Status: None (Preliminary result)   Collection Time: 12/18/22 12:00 PM   Specimen: BLOOD LEFT HAND  Result Value Ref Range Status   Specimen Description BLOOD LEFT HAND  Final   Special Requests   Final    BOTTLES DRAWN AEROBIC ONLY Blood Culture adequate volume   Culture   Final    NO GROWTH 2 DAYS Performed at [facility], [address], [place], [place] [address]    Report Status PENDING  Incomplete    Radiology Reports DG Chest Port 1 View  Result Date: 12/18/2022 CLINICAL DATA:  Dizziness. Hematuria. Possible sepsis. Hypotension. Short of breath. EXAM: PORTABLE CHEST 1 VIEW COMPARISON:  11/30/2022 FINDINGS: Previous median sternotomy and CABG. Dual lead pacemaker with leads in the region of the right atrium and right ventricle. The pulmonary vascularity is normal. The lungs are clear. No visible effusion. No acute bone finding. IMPRESSION: No active disease. Previous CABG. Dual lead pacemaker. Electronically Signed   By: Paulina Fusi M.D.   On: 12/18/2022 12:47      Signature  -   Susa Raring M.D on 12/20/2022 at 9:43 AM   -  To page go to [email or web address]

## 2022-12-20 NOTE — Progress Notes (Signed)
Patient voided small amount of pink bloody urine. No pain upon voiding. He states he felt the urge to go. Patient denies pain.

## 2022-12-20 NOTE — Progress Notes (Signed)
Occupational Therapy Treatment Patient Details Name: Robert Burns MRN: [id number] DOB: 12-01-1940 Today's Date: 12/20/2022   History of present illness 82 y.o. male who presents 12/18/22 with complaints of blood in urine. +UTI, sepsis, hypotension, PMH: extensive CAD s/p CABG, 15-16 prior stents, HLD, HTN, DM2, OSA, PAD, PAF, SSS s/p PPM, CLL   OT comments  Pt at this time presented in bed and noted needing catheter empted at this time as completely full. Pt was educated about monitoring when home and line management. Pt then when attempting to go from supine to sitting had L IV line started to bleed but was able to be stopped with elevation but nursing was informed. Pt was able to complete sit to stand transfers with supervision and mobility with one hand assist due to IV line with CGA. Pt reoprted they do not want any HH/OP therapies at this time and they will have son at home as needed when dcing.       If plan is discharge home, recommend the following:  A little help with bathing/dressing/bathroom;Assistance with cooking/housework;Assist for transportation   Equipment Recommendations  None recommended by OT    Recommendations for Other Services      Precautions / Restrictions Precautions Precautions: Other (comment);Fall       Mobility Bed Mobility Overal bed mobility: Modified Independent             General bed mobility comments: HOB elevated but as moterized bed    Transfers Overall transfer level: Needs assistance Equipment used: Rolling walker (2 wheels) Transfers: Sit to/from Stand Sit to Stand: Supervision           General transfer comment: Pt reports due to space in home they do not always use walker     Balance Overall balance assessment: No apparent balance deficits (not formally assessed) Sitting-balance support: No upper extremity supported, Feet supported Sitting balance-Leahy Scale: Good     Standing balance support: No upper extremity  supported, During functional activity Standing balance-Leahy Scale: Fair                             ADL either performed or assessed with clinical judgement   ADL Overall ADL's : Needs assistance/impaired Eating/Feeding: Independent   Grooming: Set up;Sitting   Upper Body Bathing: Set up;Sitting   Lower Body Bathing: Contact guard assist;Sit to/from stand   Upper Body Dressing : Set up   Lower Body Dressing: Contact guard assist;Sit to/from stand   Toilet Transfer: Supervision/safety     Toileting - Clothing Manipulation Details (indicate cue type and reason): foley; S with pericare     Functional mobility during ADLs: Supervision/safety;Rolling walker (2 wheels)      Extremity/Trunk Assessment Upper Extremity Assessment Upper Extremity Assessment: Generalized weakness   Lower Extremity Assessment Lower Extremity Assessment: Defer to PT evaluation        Vision   Vision Assessment?: No apparent visual deficits   Perception     Praxis      Cognition Arousal: Alert Behavior During Therapy: WFL for tasks assessed/performed Overall Cognitive Status: Within Functional Limits for tasks assessed                                          Exercises Other Exercises Other Exercises: chair marching x 20 x 3 sets    Shoulder Instructions  General Comments Per son was conerened about one open skin spot on back but was not sure if present prior and was asking sister    Pertinent Vitals/ Pain       Pain Assessment Pain Assessment: Faces Faces Pain Scale: Hurts a little bit Pain Location: bsck Pain Descriptors / Indicators: Discomfort Pain Intervention(s): Limited activity within patient's tolerance, Monitored during session  Home Living                                          Prior Functioning/Environment              Frequency  Min 1X/week        Progress Toward Goals  OT Goals(current goals  can now be found in the care plan section)  Progress towards OT goals: Progressing toward goals  Acute Rehab OT Goals Patient Stated Goal: to get home OT Goal Formulation: With patient Time For Goal Achievement: 01/02/23 Potential to Achieve Goals: Good ADL Goals Pt Will Perform Lower Body Bathing: with modified independence;sit to/from stand Pt Will Perform Lower Body Dressing: with modified independence;sit to/from stand Pt Will Transfer to Toilet: with modified independence;ambulating Pt/caregiver will Perform Home Exercise Program: Increased strength;Both right and left upper extremity;With theraband;Independently;With written HEP provided Additional ADL Goal #1: Pt will independently verbalize 3 strategies to reduce risk of falls.  Plan      Co-evaluation                 AM-PAC OT "6 Clicks" Daily Activity     Outcome Measure   Help from another person eating meals?: None Help from another person taking care of personal grooming?: A Little Help from another person toileting, which includes using toliet, bedpan, or urinal?: A Little Help from another person bathing (including washing, rinsing, drying)?: A Little Help from another person to put on and taking off regular upper body clothing?: A Little Help from another person to put on and taking off regular lower body clothing?: A Little 6 Click Score: 19    End of Session Equipment Utilized During Treatment: Rolling walker (2 wheels);Gait belt  OT Visit Diagnosis: Unsteadiness on feet (R26.81);Muscle weakness (generalized) (M62.81)   Activity Tolerance Patient tolerated treatment well   Patient Left in chair;with call bell/phone within reach   Nurse Communication Mobility status (IV line bleeding and foley bag needing draining)        Time: 1050-1150 OT Time Calculation (min): 60 min  Charges: OT General Charges $OT Visit: 1 Visit OT Treatments $Self Care/Home Management : 53-67 mins  Presley Raddle OTR/L   Acute Rehab Services  [phone] office number   Alphia Moh 12/20/2022, 11:59 AM

## 2022-12-20 NOTE — Progress Notes (Signed)
     Subjective: Patient in bed having breakfast on arrival this morning.  I spoke with he and his daughter, who joined [place] over the phone, with updates and reviewed the plan again.  All questions were answered to their satisfaction.  Objective: Vital signs in last 24 hours: Temp:  [97.5 F (36.4 C)-99 F (37.2 C)] 98.5 F (36.9 C) (09/11 0400) Pulse Rate:  [58-84] 60 (09/11 0900) Resp:  [12-24] 16 (09/11 0900) BP: (94-139)/(42-65) 134/60 (09/11 0900) SpO2:  [80 %-99 %] 99 % (09/11 0400)  Assessment/Plan: #Hematuria- resolved   Continue Flomax and finasteride.  He will appreciate benefit from both of these medications in the long-term but not enough time has been provided for them to fully take effect.  Patient has not yet had cystoscopy.  His hematuria remains undifferentiated.  We can perform this on an outpatient basis. He se other providers in the same building and daughter will see that he follows up.   CT hematuria today.   Hematuria has resolved as of this morning.  CBI clamped.  If still clear in a few hours will discontinue CBI and consider voiding trial.  Plavix and Xarelto on hold.  Can challenge again at the discretion of primary team.  We will continue to follow along with you.  Please call with questions.  Intake/Output from previous day: 09/10 0701 - 09/11 0700 In: 6818 [Blood:818] Out: 44010 [Urine:14800]  Intake/Output this shift: Total I/O In: 60 [Other:60] Out: 2900 [Urine:2900]  Physical Exam:  General: Alert and oriented CV: No cyanosis Lungs: equal chest rise Gu: Three-way Foley catheter in place with CBI on low gtt. and clear irrigant.  Clamped this morning.  Lab Results: Recent Labs    12/19/22 0328 12/19/22 1433 12/20/22 0402  HGB 7.0* 9.0* 8.8*  HCT 23.9* 29.4* 28.4*   BMET Recent Labs    12/19/22 0328 12/20/22 0402  NA 136 138  K 4.1 3.5  CL 111 112*  CO2 19* 20*  GLUCOSE 99 125*  BUN 25* 21  CREATININE 1.17 1.24  CALCIUM  7.9* 7.9*     Studies/Results: DG Chest Port 1 View  Result Date: 12/18/2022 CLINICAL DATA:  Dizziness. Hematuria. Possible sepsis. Hypotension. Short of breath. EXAM: PORTABLE CHEST 1 VIEW COMPARISON:  11/30/2022 FINDINGS: Previous median sternotomy and CABG. Dual lead pacemaker with leads in the region of the right atrium and right ventricle. The pulmonary vascularity is normal. The lungs are clear. No visible effusion. No acute bone finding. IMPRESSION: No active disease. Previous CABG. Dual lead pacemaker. Electronically Signed   By: Paulina Fusi M.D.   On: 12/18/2022 12:47      LOS: 2 days   Elmon Kirschner, NP [facility] Pager: [phone]  12/20/2022, 10:02 AM

## 2022-12-20 NOTE — Plan of Care (Signed)
  Problem: Activity: Goal: Ability to tolerate increased activity will improve Outcome: Progressing   Problem: Cardiac: Goal: Ability to achieve and maintain adequate cardiovascular perfusion will improve Outcome: Progressing   Problem: Education: Goal: Understanding of CV disease, CV risk reduction, and recovery process will improve Outcome: Progressing Goal: Individualized Educational Video(s) Outcome: Progressing

## 2022-12-21 DIAGNOSIS — Z951 Presence of aortocoronary bypass graft: Secondary | ICD-10-CM | POA: Diagnosis not present

## 2022-12-21 DIAGNOSIS — Z7901 Long term (current) use of anticoagulants: Secondary | ICD-10-CM | POA: Diagnosis not present

## 2022-12-21 DIAGNOSIS — Z5181 Encounter for therapeutic drug level monitoring: Secondary | ICD-10-CM | POA: Diagnosis not present

## 2022-12-21 DIAGNOSIS — I480 Paroxysmal atrial fibrillation: Secondary | ICD-10-CM | POA: Diagnosis not present

## 2022-12-21 DIAGNOSIS — Z950 Presence of cardiac pacemaker: Secondary | ICD-10-CM | POA: Diagnosis not present

## 2022-12-21 DIAGNOSIS — I2510 Atherosclerotic heart disease of native coronary artery without angina pectoris: Secondary | ICD-10-CM | POA: Diagnosis not present

## 2022-12-21 LAB — BASIC METABOLIC PANEL
Anion gap: 7 (ref 5–15)
BUN: 21 mg/dL (ref 8–23)
CO2: 19 mmol/L — ABNORMAL LOW (ref 22–32)
Calcium: 8 mg/dL — ABNORMAL LOW (ref 8.9–10.3)
Chloride: 112 mmol/L — ABNORMAL HIGH (ref 98–111)
Creatinine, Ser: 1.48 mg/dL — ABNORMAL HIGH (ref 0.61–1.24)
GFR, Estimated: 47 mL/min — ABNORMAL LOW (ref >60)
Glucose, Bld: 136 mg/dL — ABNORMAL HIGH (ref 70–99)
Potassium: 3.6 mmol/L (ref 3.5–5.1)
Sodium: 138 mmol/L (ref 135–145)

## 2022-12-21 LAB — CBC WITH DIFFERENTIAL/PLATELET
Abs Immature Granulocytes: 0.2 10*3/uL — ABNORMAL HIGH (ref 0.00–0.07)
Basophils Absolute: 0.2 10*3/uL — ABNORMAL HIGH (ref 0.0–0.1)
Basophils Relative: 1 %
Eosinophils Absolute: 0.5 10*3/uL (ref 0.0–0.5)
Eosinophils Relative: 3 %
HCT: 27.7 % — ABNORMAL LOW (ref 39.0–52.0)
Hemoglobin: 8.3 g/dL — ABNORMAL LOW (ref 13.0–17.0)
Lymphocytes Relative: 42 %
Lymphs Abs: 6.6 10*3/uL — ABNORMAL HIGH (ref 0.7–4.0)
MCH: 24.5 pg — ABNORMAL LOW (ref 26.0–34.0)
MCHC: 30 g/dL (ref 30.0–36.0)
MCV: 81.7 fL (ref 80.0–100.0)
Metamyelocytes Relative: 1 %
Monocytes Absolute: 0.9 10*3/uL (ref 0.1–1.0)
Monocytes Relative: 6 %
Neutro Abs: 7.4 10*3/uL (ref 1.7–7.7)
Neutrophils Relative %: 47 %
Platelets: 139 10*3/uL — ABNORMAL LOW (ref 150–400)
RBC: 3.39 MIL/uL — ABNORMAL LOW (ref 4.22–5.81)
RDW: 22.5 % — ABNORMAL HIGH (ref 11.5–15.5)
WBC: 15.8 10*3/uL — ABNORMAL HIGH (ref 4.0–10.5)
nRBC: 0 % (ref 0.0–0.2)
nRBC: 0 /100{WBCs}

## 2022-12-21 LAB — GLUCOSE, CAPILLARY
Glucose-Capillary: 197 mg/dL — ABNORMAL HIGH (ref 70–99)
Glucose-Capillary: 119 mg/dL — ABNORMAL HIGH (ref 70–99)
Glucose-Capillary: 102 mg/dL — ABNORMAL HIGH (ref 70–99)
Glucose-Capillary: 145 mg/dL — ABNORMAL HIGH (ref 70–99)

## 2022-12-21 LAB — C-REACTIVE PROTEIN: CRP: 1.5 mg/dL — ABNORMAL HIGH (ref <1.0)

## 2022-12-21 LAB — PROCALCITONIN: Procalcitonin: <0.1 ng/mL

## 2022-12-21 LAB — BRAIN NATRIURETIC PEPTIDE: B Natriuretic Peptide: 320.4 pg/mL — ABNORMAL HIGH (ref 0.0–100.0)

## 2022-12-21 LAB — MAGNESIUM: Magnesium: 2.1 mg/dL (ref 1.7–2.4)

## 2022-12-21 MED ORDER — LACTATED RINGERS IV SOLN: INTRAVENOUS | Status: AC

## 2022-12-21 MED ORDER — RIVAROXABAN 15 MG PO TABS: 15.0000 mg | ORAL_TABLET | Freq: Every day | ORAL | Status: DC

## 2022-12-21 MED ORDER — RIVAROXABAN 15 MG PO TABS
15.0000 mg | ORAL_TABLET | Freq: Every day | ORAL | Status: DC
  Administered 2022-12-22 – 2022-12-23 (×2): 15 mg via ORAL
  Filled 2022-12-21 (×2): qty 1

## 2022-12-21 NOTE — Progress Notes (Signed)
Subjective: Patient in bed having breakfast on arrival this morning.  I spoke with he and his daughter, who joined [place] over the phone, with updates and reviewed the plan again.  All questions were answered to their satisfaction.  Objective: Vital signs in last 24 hours: Temp:  [98.3 F (36.8 C)-98.4 F (36.9 C)] 98.3 F (36.8 C) (09/12 0400) Pulse Rate:  [56-71] 60 (09/12 0400) Resp:  [14-25] 20 (09/12 0400) BP: (89-149)/(47-87) 137/59 (09/12 0400) SpO2:  [98 %-100 %] 99 % (09/12 0400)  Assessment/Plan: #Hematuria- resolved   Continue Flomax and finasteride.  He will appreciate benefit from both of these medications in the long-term but not enough time has been provided for them to fully take effect.  Patient has not yet had cystoscopy.  His hematuria remains undifferentiated. We can perform this on an outpatient basis. He does not need to hold anticoagulation for flexible cysto. He sees other providers in the same building and daughter will see that he follows up.   CT hematuria unremarkable.  Blood products noted in bladder.  No obvious lesions  Unfortunately Foley catheter was removed at bedside without being fully deflated.  Since went back into urinary retention last night and catheter was replaced.  On examination today he has clear tea colored urine.  No evidence of active bleeding.  Hospitalist to challenge his Xarelto this evening.  Patient will discharge home with Foley catheter.  Close follow-up to discuss surgical options, if any are available.   We will continue to follow along with you.  Please call with questions.  Intake/Output from previous day: 09/11 0701 - 09/12 0700 In: 1640 [P.O.:1080; IV Piggyback:500] Out: 3244 [Urine:6618]  Intake/Output this shift: Total I/O In: 760 [P.O.:760] Out: -   Physical Exam:  General: Alert and oriented CV: No cyanosis Lungs: equal chest rise Gu: foley in place draining clear tea colored urine, clear yellow in tubing.    Lab Results: Recent Labs    12/19/22 1433 12/20/22 0402 12/21/22 0322  HGB 9.0* 8.8* 8.3*  HCT 29.4* 28.4* 27.7*   BMET Recent Labs    12/20/22 0402 12/21/22 0322  NA 138 138  K 3.5 3.6  CL 112* 112*  CO2 20* 19*  GLUCOSE 125* 136*  BUN 21 21  CREATININE 1.24 1.48*  CALCIUM 7.9* 8.0*     Studies/Results: CT HEMATURIA WORKUP  Result Date: 12/21/2022 CLINICAL DATA:  Hematuria EXAM: CT ABDOMEN AND PELVIS WITHOUT AND WITH CONTRAST TECHNIQUE: Multidetector CT imaging of the abdomen and pelvis was performed following the standard protocol before and following the bolus administration of intravenous contrast. RADIATION DOSE REDUCTION: This exam was performed according to the departmental dose-optimization program which includes automated exposure control, adjustment of the mA and/or kV according to patient size and/or use of iterative reconstruction technique. CONTRAST:  OMNIPAQUE IOHEXOL 350 MG/ML SOLN COMPARISON:  02/15/2016 FINDINGS: Lower chest: Lungs are essentially clear. Hepatobiliary: Unenhanced liver is unremarkable. Gallbladder is unremarkable. No intrahepatic or extrahepatic duct dilatation. Pancreas: Parenchymal calcifications, suggesting sequela of chronic pancreatitis. Spleen: Within normal limits. Adrenals/Urinary Tract: Adrenal glands are within normal limits. Dominant 8.1 cm simple left lower pole renal cyst (series 3/image 30), benign. Two probable hemorrhagic cysts in the right lower pole measuring up to 9 mm (series 2/image 30), technically too small to characterize, most likely benign (Bosniak II). No follow-up is recommended. No hydronephrosis. Thick-walled bladder. Layering high density in the bladder favors hemorrhage, less likely an underlying polypoid lesion (series 3/image 68).  Stomach/Bowel: Stomach is within normal limits. No evidence of bowel obstruction. Normal appendix (series 3/image 46) Scattered left colonic diverticulosis, without evidence of  diverticulitis. Vascular/Lymphatic: No evidence of abdominal aortic aneurysm. Atherosclerotic calcifications of the abdominal aorta and branch vessels. No suspicious abdominopelvic lymphadenopathy. Reproductive: Prostatomegaly, with enlargement of the central gland indenting the base the bladder, suggesting BPH. Other: No abdominopelvic ascites. Musculoskeletal: Degenerative changes of the visualized thoracolumbar spine. IMPRESSION: Layering high density in the bladder favors hemorrhage, less likely an underlying polypoid lesion. Cystoscopy is suggested. Prostatomegaly, suggesting BPH. Associated thick-walled bladder, suggesting chronic bladder outlet obstruction. Additional ancillary findings as above. Electronically Signed   By: Charline Bills M.D.   On: 12/21/2022 00:39      LOS: 3 days   Elmon Kirschner, NP [facility] Pager: [phone]  12/21/2022, 12:21 PM

## 2022-12-21 NOTE — Plan of Care (Signed)
  Problem: Education: Goal: Understanding of cardiac disease, CV risk reduction, and recovery process will improve Outcome: Progressing Goal: Individualized Educational Video(s) Outcome: Progressing   Problem: Activity: Goal: Ability to tolerate increased activity will improve Outcome: Progressing   Problem: Cardiac: Goal: Ability to achieve and maintain adequate cardiovascular perfusion will improve Outcome: Progressing   Problem: Activity: Goal: Ability to return to baseline activity level will improve Outcome: Progressing   Problem: Cardiovascular: Goal: Ability to achieve and maintain adequate cardiovascular perfusion will improve Outcome: Progressing   Problem: Health Behavior/Discharge Planning: Goal: Ability to safely manage health-related needs after discharge will improve Outcome: Progressing   Problem: Education: Goal: Knowledge of General Education information will improve Description: Including pain rating scale, medication(s)/side effects and non-pharmacologic comfort measures Outcome: Progressing   Problem: Clinical Measurements: Goal: Will remain free from infection Outcome: Progressing Goal: Diagnostic test results will improve Outcome: Progressing   Problem: Activity: Goal: Risk for activity intolerance will decrease Outcome: Progressing   Problem: Coping: Goal: Level of anxiety will decrease Outcome: Progressing   Problem: Elimination: Goal: Will not experience complications related to urinary retention Outcome: Progressing

## 2022-12-21 NOTE — Progress Notes (Signed)
Mobility Specialist: Progress Note   12/21/22 1514  Mobility  Activity Ambulated with assistance in hallway  Level of Assistance Contact guard assist, steadying assist  Assistive Device Front wheel walker  Distance Ambulated (ft) 200 ft  Activity Response Tolerated well  Mobility Referral Yes  $Mobility charge 1 Mobility  Mobility Specialist Start Time (ACUTE ONLY) 1348  Mobility Specialist Stop Time (ACUTE ONLY) 1406  Mobility Specialist Time Calculation (min) (ACUTE ONLY) 18 min    Pre-Mobility: BP 117/56 (74) During Mobility: BP 119/59 (78)  Pt was agreeable to mobility session - received in bed. CG for bed mobility to sit up to EOB and for ambulation. Had c/o knee discomfort and slight dizziness towards EOS. Returned to room without fault. Left in chair with all needs met, call bell in reach. NT and family in room.   Maurene Capes Mobility Specialist Please contact via SecureChat or Rehab office at [phone]

## 2022-12-21 NOTE — Progress Notes (Signed)
PROGRESS NOTE                                                                                                                                                                                                             Patient Demographics:    Robert Burns, is a 82 y.o. male, DOB - 25-Jul-1940, IHK:[id number]  Outpatient Primary MD for the patient is Thana Ates, MD    LOS - 3  Admit date - 12/18/2022    Chief Complaint  Patient presents with   Dizziness   Hematuria       Brief Narrative (HPI from H&P)   82 y.o. male with medical history significant of hypertension, hyperlipidemia, CAD s/p CABG with multiple PCI's, PAF, sick sinus syndrome s/p pacemaker, CLL, and BPH with urinary retention who presents with complaints of blood in urine.  He had just recently been hospitalized 8/22-8/24 with NSTEMI high-sensitivity troponins elevated up to 931.    Left heart cath showed occluded SVG to RCA, but was unamenable to intervention and medical management was recommended, he was discharged from the hospital on 12/02/2022, subsequently patient developed urinary retention was hospitalized at [facility] health 12/06/2021 he had Foley catheter placed and discharged home on 12/12/2021 with Foley catheter, Xarelto, Plavix and ongoing hematuria.  Subsequently saw his cardiologist and at that time Plavix was discontinued, unfortunately he continued to have hematuria and started developing weakness and dizziness presented to the hospital with severe hypotension, worsening anemia and hematuria and was admitted.   Subjective:   Patient in bed, appears comfortable, denies any headache, no fever, no chest pain or pressure, no shortness of breath , no abdominal pain. No new focal weakness.   Assessment  & Plan :   Sepsis due to UTI in the presence of indwelling Foley catheter present on admission, ongoing hematuria present on admission - he has been seen  by urology, currently having three-way bladder irrigation, urine getting clear on 12/20/2022, Plavix was discontinued a week ago by cardiology, Xarelto currently on hold, if bleeding continues to abate then will resume Xarelto with caution 12/22/2022 morning.  Foley catheter indwelling which was present on admission, it was discontinued 12/20/2022 however he retained again and Foley was reinserted midnight 12/21/2022, on Flomax, it is post 2 units of packed RBC transfusion on 12/19/2022.  On IV antibiotics with improving  sepsis pathophysiology, continue to monitor cultures, CBC, appreciate urology input.  If bleeding is not worse and CBC holds good likely discharge home on Xarelto on 12/22/2022 with outpatient urology and cardiology follow-up.   Acute blood loss anemia underlying history of CLL.  Anemia panel pending 2 units of packed RBC on 12/19/2022 blood loss plan as in #1 above.  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  Hypotension, prerenal azotemia due to sepsis and anemia, AKI.  All improving with IV fluids, transfusion and antibiotics.  Gentle hydration again on 12/21/2022, monitor AKI.  CAD s/p CABG and multiple PCI's.  Plavix recently discontinued, chest pain-free, appreciate cardiology follow-up.  No acute issues.  Try to keep hemoglobin above 8.  Statin continued, beta-blocker as tolerated by blood pressure.  No antiplatelets due to severe hematuria.  Paroxysmal atrial fibrillation, sick sinus syndrome s/p pacemaker.  [place] vas 2 score of greater than 3.  Beta-blocker, cardiology on board.  Xarelto as above.  Dyslipidemia.  On statin.  DM type II.  Oral glipizide and metformin held.  SSI.  Lab Results  Component Value Date   HGBA1C 6.6 (H) 11/30/2022   CBG (last 3)  Recent Labs    12/20/22 1708 12/20/22 2126 12/21/22 0839  GLUCAP 128* 139* 197*         Condition -  Guarded  Family Communication  :  daughter Alvis Lemmings [phone]  on 12/19/2022, 12/20/22, 12/21/2022  Code Status : Full  code  Consults  : Cardiology, urology  PUD Prophylaxis : PPI   Procedures  :           Disposition Plan  :    Status is: Inpatient   DVT Prophylaxis  :    Place TED hose Start: 12/20/22 0946 SCDs Start: 12/18/22 1524  Lab Results  Component Value Date   PLT 139 (L) 12/21/2022    Diet :  Diet Order             Diet heart healthy/carb modified Room service appropriate? Yes; Fluid consistency: Thin  Diet effective now                    Inpatient Medications  Scheduled Meds:  atorvastatin  80 mg Oral q1800   Chlorhexidine Gluconate Cloth  6 each Topical Daily   empagliflozin  10 mg Oral Daily   finasteride  5 mg Oral Daily   insulin aspart  0-9 Units Subcutaneous TID WC   isosorbide mononitrate  30 mg Oral Daily   melatonin  5 mg Oral QHS   metoprolol tartrate  25 mg Oral BID   pantoprazole  40 mg Oral Daily   ranolazine  500 mg Oral BID   sodium chloride flush  3 mL Intravenous Q12H   tamsulosin  0.4 mg Oral BID   Continuous Infusions:  cefTRIAXone (ROCEPHIN)  IV 2 g (12/20/22 1555)   sodium chloride irrigation 0 mL (12/18/22 1455)   PRN Meds:.acetaminophen **OR** acetaminophen, albuterol, HYDROcodone-acetaminophen    Objective:   Vitals:   12/20/22 1950 12/20/22 2216 12/20/22 2300 12/21/22 0400  BP: (!) 139/59 (!) 149/61 136/87 (!) 137/59  Pulse: 68 61 60 60  Resp: 20  17 20   Temp: 98.4 F (36.9 C)  98.4 F (36.9 C) 98.3 F (36.8 C)  TempSrc: Oral  Oral Oral  SpO2: 98%  98% 99%    Wt Readings from Last 3 Encounters:  12/14/22 74.8 kg  12/13/22 75.7 kg  12/07/22 77.1 kg  Intake/Output Summary (Last 24 hours) at 12/21/2022 1018 Last data filed at 12/21/2022 0800 Gross per 24 hour  Intake 1580 ml  Output 3718 ml  Net -2138 ml     Physical Exam  Awake Alert, No new F.N deficits, Normal affect, Foley with bloody urine [place].AT,PERRAL Supple Neck, No JVD,   Symmetrical Chest wall movement, Good air movement bilaterally,  CTAB RRR,No Gallops,Rubs or new Murmurs,  +ve B.Sounds, Abd Soft, No tenderness,   No Cyanosis, Clubbing or edema     Data Review:    Recent Labs  Lab 12/14/22 1100 12/18/22 1128 12/18/22 1600 12/18/22 2131 12/19/22 0328 12/19/22 1433 12/20/22 0402 12/21/22 0322  WBC 24.3* 23.2*  --   --  20.7* 19.2* 17.0* 15.8*  HGB 10.4* 9.3*   < > 7.7* 7.0* 9.0* 8.8* 8.3*  HCT 34.6* 32.7*   < > 26.7* 23.9* 29.4* 28.4* 27.7*  PLT 233 180  --   --  143* 138* 127* 139*  MCV 79.4* 84.3  --   --  79.1* 81.7 83.0 81.7  MCH 23.9* 24.0*  --   --  23.2* 25.0* 25.7* 24.5*  MCHC 30.1 28.4*  --   --  29.3* 30.6 31.0 30.0  RDW 22.6* 23.1*  --   --  23.0* 21.1* 21.5* 22.5*  LYMPHSABS 15.7* 10.7*  --   --   --   --  10.9* 6.6*  MONOABS 0.8 0.2  --   --   --   --  1.5* 0.9  EOSABS 0.1 0.0  --   --   --   --  0.1 0.5  BASOSABS 0.1 0.0  --   --   --   --  0.1 0.2*   < > = values in this interval not displayed.    Recent Labs  Lab 12/14/22 1100 12/18/22 1128 12/18/22 1148 12/18/22 1346 12/19/22 0328 12/19/22 1438 12/20/22 0402 12/21/22 0322  NA 144 139  --   --  136  --  138 138  K 4.5 5.0  --   --  4.1  --  3.5 3.6  CL 110 110  --   --  111  --  112* 112*  CO2 27 17*  --   --  19*  --  20* 19*  ANIONGAP 7 12  --   --  6  --  6 7  GLUCOSE 141* 101*  --   --  99  --  125* 136*  BUN 15 21  --   --  25*  --  21 21  CREATININE 1.03 1.25*  --   --  1.17  --  1.24 1.48*  AST 12* 17  --   --   --   --   --   --   ALT 14 15  --   --   --   --   --   --   ALKPHOS 63 51  --   --   --   --   --   --   BILITOT 0.6 0.3  --   --   --   --   --   --   ALBUMIN 3.9 3.1*  --   --   --   --   --   --   CRP  --   --   --   --   --  1.5* 1.7* 1.5*  PROCALCITON  --   --   --   --   --  <  0.10 <0.10 <0.10  LATICACIDVEN  --   --  3.3* 3.1*  --   --   --   --   INR  --  2.8*  --   --   --   --   --   --   BNP  --   --   --   --  234.3*  --  277.0* 320.4*  MG  --   --   --   --   --   --  1.8 2.1  CALCIUM 9.2 8.2*   --   --  7.9*  --  7.9* 8.0*    Micro Results Recent Results (from the past 240 hour(s))  Blood Culture (routine x 2)     Status: None (Preliminary result)   Collection Time: 12/18/22 11:28 AM   Specimen: BLOOD  Result Value Ref Range Status   Specimen Description BLOOD RIGHT ANTECUBITAL  Final   Special Requests   Final    BOTTLES DRAWN AEROBIC ONLY Blood Culture results may not be optimal due to an excessive volume of blood received in culture bottles   Culture   Final    NO GROWTH 3 DAYS Performed at [facility], [address], [place], [place] [address]    Report Status PENDING  Incomplete  Urine Culture     Status: Abnormal   Collection Time: 12/18/22 11:28 AM   Specimen: Urine, Random  Result Value Ref Range Status   Specimen Description URINE, RANDOM  Final   Special Requests   Final    NONE Reflexed from [id number] Performed at [facility], [address], [place], [place] [address]    Culture MULTIPLE SPECIES PRESENT, SUGGEST RECOLLECTION (A)  Final   Report Status 12/20/2022 FINAL  Final  Blood Culture (routine x 2)     Status: None (Preliminary result)   Collection Time: 12/18/22 12:00 PM   Specimen: BLOOD LEFT HAND  Result Value Ref Range Status   Specimen Description BLOOD LEFT HAND  Final   Special Requests   Final    BOTTLES DRAWN AEROBIC ONLY Blood Culture adequate volume   Culture   Final    NO GROWTH 3 DAYS Performed at [facility], [address] [place], [place] [address]    Report Status PENDING  Incomplete    Radiology Reports CT HEMATURIA WORKUP  Result Date: 12/21/2022 CLINICAL DATA:  Hematuria EXAM: CT ABDOMEN AND PELVIS WITHOUT AND WITH CONTRAST TECHNIQUE: Multidetector CT imaging of the abdomen and pelvis was performed following the standard protocol before and following the bolus administration of intravenous contrast. RADIATION DOSE REDUCTION: This exam was performed according to the departmental dose-optimization program  which includes automated exposure control, adjustment of the mA and/or kV according to patient size and/or use of iterative reconstruction technique. CONTRAST:  OMNIPAQUE IOHEXOL 350 MG/ML SOLN COMPARISON:  02/15/2016 FINDINGS: Lower chest: Lungs are essentially clear. Hepatobiliary: Unenhanced liver is unremarkable. Gallbladder is unremarkable. No intrahepatic or extrahepatic duct dilatation. Pancreas: Parenchymal calcifications, suggesting sequela of chronic pancreatitis. Spleen: Within normal limits. Adrenals/Urinary Tract: Adrenal glands are within normal limits. Dominant 8.1 cm simple left lower pole renal cyst (series 3/image 30), benign. Two probable hemorrhagic cysts in the right lower pole measuring up to 9 mm (series 2/image 30), technically too small to characterize, most likely benign (Bosniak II). No follow-up is recommended. No hydronephrosis. Thick-walled bladder. Layering high density in the bladder favors hemorrhage, less likely an underlying polypoid lesion (  series 3/image 68). Stomach/Bowel: Stomach is within normal limits. No evidence of bowel obstruction. Normal appendix (series 3/image 46) Scattered left colonic diverticulosis, without evidence of diverticulitis. Vascular/Lymphatic: No evidence of abdominal aortic aneurysm. Atherosclerotic calcifications of the abdominal aorta and branch vessels. No suspicious abdominopelvic lymphadenopathy. Reproductive: Prostatomegaly, with enlargement of the central gland indenting the base the bladder, suggesting BPH. Other: No abdominopelvic ascites. Musculoskeletal: Degenerative changes of the visualized thoracolumbar spine. IMPRESSION: Layering high density in the bladder favors hemorrhage, less likely an underlying polypoid lesion. Cystoscopy is suggested. Prostatomegaly, suggesting BPH. Associated thick-walled bladder, suggesting chronic bladder outlet obstruction. Additional ancillary findings as above. Electronically Signed   By: Charline Bills M.D.   On: 12/21/2022 00:39   DG Chest Port 1 View  Result Date: 12/18/2022 CLINICAL DATA:  Dizziness. Hematuria. Possible sepsis. Hypotension. Short of breath. EXAM: PORTABLE CHEST 1 VIEW COMPARISON:  11/30/2022 FINDINGS: Previous median sternotomy and CABG. Dual lead pacemaker with leads in the region of the right atrium and right ventricle. The pulmonary vascularity is normal. The lungs are clear. No visible effusion. No acute bone finding. IMPRESSION: No active disease. Previous CABG. Dual lead pacemaker. Electronically Signed   By: Paulina Fusi M.D.   On: 12/18/2022 12:47      Signature  -   Susa Raring M.D on 12/21/2022 at 10:18 AM   -  To page go to [email or web address]

## 2022-12-21 NOTE — Progress Notes (Signed)
Patient bladder scan showed 424 ml urine retention.Dr.Gardner made aware,ordered foley insertion.Patient and pt's family at bedside educated.Patient stated he will try and void at this time  At 22:30 pm-:Patient only managed to void 50 cc urine.Bladder scan showed 660 ml retention.foley's inserted,drained 750 ml of light pink urine.

## 2022-12-21 NOTE — Plan of Care (Signed)
  Problem: Education: Goal: Understanding of cardiac disease, CV risk reduction, and recovery process will improve Outcome: Progressing Goal: Individualized Educational Video(s) Outcome: Progressing   Problem: Activity: Goal: Ability to tolerate increased activity will improve Outcome: Progressing   Problem: Cardiac: Goal: Ability to achieve and maintain adequate cardiovascular perfusion will improve Outcome: Progressing   Problem: Activity: Goal: Ability to return to baseline activity level will improve Outcome: Progressing   Problem: Cardiovascular: Goal: Ability to achieve and maintain adequate cardiovascular perfusion will improve Outcome: Progressing Goal: Vascular access site(s) Level 0-1 will be maintained Outcome: Progressing

## 2022-12-21 NOTE — Progress Notes (Signed)
Cardiologist:  Allyson Sabal  Subjective:  Denies SSCP, palpitations or Dyspnea Urinary retention 800 cc  Still pending plan from urology   Objective:  Vitals:   12/20/22 1950 12/20/22 2216 12/20/22 2300 12/21/22 0400  BP: (!) 139/59 (!) 149/61 136/87 (!) 137/59  Pulse: 68 61 60 60  Resp: 20  17 20   Temp: 98.4 F (36.9 C)  98.4 F (36.9 C) 98.3 F (36.8 C)  TempSrc: Oral  Oral Oral  SpO2: 98%  98% 99%    Intake/Output from previous day:  Intake/Output Summary (Last 24 hours) at 12/21/2022 0857 Last data filed at 12/21/2022 0800 Gross per 24 hour  Intake 1900 ml  Output 3718 ml  Net -1818 ml    Physical Exam: Elderly frail male PPM under left clavicle SEM Lipoma over right chest Post sternotomy Abdomen benign No edema Foley with blood tinged urine being irrigated   Lab Results: Basic Metabolic Panel: Recent Labs    12/20/22 0402 12/21/22 0322  NA 138 138  K 3.5 3.6  CL 112* 112*  CO2 20* 19*  GLUCOSE 125* 136*  BUN 21 21  CREATININE 1.24 1.48*  CALCIUM 7.9* 8.0*  MG 1.8 2.1   Liver Function Tests: Recent Labs    12/18/22 1128  AST 17  ALT 15  ALKPHOS 51  BILITOT 0.3  PROT 5.6*  ALBUMIN 3.1*   No results for input(s): "LIPASE", "AMYLASE" in the last 72 hours. CBC: Recent Labs    12/20/22 0402 12/21/22 0322  WBC 17.0* 15.8*  NEUTROABS 4.5 7.4  HGB 8.8* 8.3*  HCT 28.4* 27.7*  MCV 83.0 81.7  PLT 127* 139*    Anemia Panel: Recent Labs    12/19/22 1433 12/19/22 1438  VITAMINB12  --  508  FOLATE  --  10.3  FERRITIN  --  136  TIBC  --  258  IRON  --  218*  RETICCTPCT 2.2  --     Imaging: CT HEMATURIA WORKUP  Result Date: 12/21/2022 CLINICAL DATA:  Hematuria EXAM: CT ABDOMEN AND PELVIS WITHOUT AND WITH CONTRAST TECHNIQUE: Multidetector CT imaging of the abdomen and pelvis was performed following the standard protocol before and following the bolus administration of intravenous contrast. RADIATION DOSE REDUCTION: This exam was performed  according to the departmental dose-optimization program which includes automated exposure control, adjustment of the mA and/or kV according to patient size and/or use of iterative reconstruction technique. CONTRAST:  OMNIPAQUE IOHEXOL 350 MG/ML SOLN COMPARISON:  02/15/2016 FINDINGS: Lower chest: Lungs are essentially clear. Hepatobiliary: Unenhanced liver is unremarkable. Gallbladder is unremarkable. No intrahepatic or extrahepatic duct dilatation. Pancreas: Parenchymal calcifications, suggesting sequela of chronic pancreatitis. Spleen: Within normal limits. Adrenals/Urinary Tract: Adrenal glands are within normal limits. Dominant 8.1 cm simple left lower pole renal cyst (series 3/image 30), benign. Two probable hemorrhagic cysts in the right lower pole measuring up to 9 mm (series 2/image 30), technically too small to characterize, most likely benign (Bosniak II). No follow-up is recommended. No hydronephrosis. Thick-walled bladder. Layering high density in the bladder favors hemorrhage, less likely an underlying polypoid lesion (series 3/image 68). Stomach/Bowel: Stomach is within normal limits. No evidence of bowel obstruction. Normal appendix (series 3/image 46) Scattered left colonic diverticulosis, without evidence of diverticulitis. Vascular/Lymphatic: No evidence of abdominal aortic aneurysm. Atherosclerotic calcifications of the abdominal aorta and branch vessels. No suspicious abdominopelvic lymphadenopathy. Reproductive: Prostatomegaly, with enlargement of the central gland indenting the base the bladder, suggesting BPH. Other: No abdominopelvic ascites. Musculoskeletal: Degenerative changes of the  visualized thoracolumbar spine. IMPRESSION: Layering high density in the bladder favors hemorrhage, less likely an underlying polypoid lesion. Cystoscopy is suggested. Prostatomegaly, suggesting BPH. Associated thick-walled bladder, suggesting chronic bladder outlet obstruction. Additional ancillary  findings as above. Electronically Signed   By: Charline Bills M.D.   On: 12/21/2022 00:39    Cardiac Studies:  ECG: A pacing no acute changes    Telemetry:  a pacing   Echo: EF 45-50% mild AR moderate bi atrial enlargement   Medications:    atorvastatin  80 mg Oral q1800   Chlorhexidine Gluconate Cloth  6 each Topical Daily   empagliflozin  10 mg Oral Daily   finasteride  5 mg Oral Daily   insulin aspart  0-9 Units Subcutaneous TID WC   isosorbide mononitrate  30 mg Oral Daily   melatonin  5 mg Oral QHS   metoprolol tartrate  25 mg Oral BID   pantoprazole  40 mg Oral Daily   ranolazine  500 mg Oral BID   sodium chloride flush  3 mL Intravenous Q12H   tamsulosin  0.4 mg Oral BID      cefTRIAXone (ROCEPHIN)  IV 2 g (12/20/22 1555)   sodium chloride irrigation 0 mL (12/18/22 1455)    Assessment/Plan:  CABG/CAD:  known occlusion of LCX SVG, recent occlusion SVG RCA not intervened on 70% stenosis in proximal stent to Diagonal and patent LIMA Like start 81 mg ASA when hematuria resolves Resume home lopressor at lower dose 50 mg BID has PPM back up, ranexa and imdur at lower dose 30 mg daily PAF:  in sinus with A pacing hold xarelto for now no need for heparin  CHF:  received iv lasixx 1 . EF 45-50% volume ok hold entresto for now  Hematuria:  per urology foley irrigated may need cystoscopy ok to proceed from cardiac perspective here or at [facility] Continue proscar and flomax Covered with Rocephin Some bladder spasm clinically  DM:  continue Jardiance given cardiac issues CLL :  followed by Leonides Schanz PLT 127  Anemia:  post transfusion 2 unitis Hct 27.7 Consider transfusion of another unit   Still holding ASA and xarelto pending plan from urology Patient should be transferred to [facility] and have cystoscopy as inpatient !! Will be difficult to assess resumption of ASA and xarelto as outpatient as well as need for ongoing foley   Charlton Haws 12/21/2022, 8:57 AM

## 2022-12-21 NOTE — Plan of Care (Signed)
Less discomfort and urine with less blood noted. Patient to keep foley catheter in place.

## 2022-12-22 DIAGNOSIS — Z950 Presence of cardiac pacemaker: Secondary | ICD-10-CM | POA: Diagnosis not present

## 2022-12-22 DIAGNOSIS — I480 Paroxysmal atrial fibrillation: Secondary | ICD-10-CM | POA: Diagnosis not present

## 2022-12-22 DIAGNOSIS — Z951 Presence of aortocoronary bypass graft: Secondary | ICD-10-CM | POA: Diagnosis not present

## 2022-12-22 DIAGNOSIS — I2510 Atherosclerotic heart disease of native coronary artery without angina pectoris: Secondary | ICD-10-CM | POA: Diagnosis not present

## 2022-12-22 LAB — CBC WITH DIFFERENTIAL/PLATELET
Abs Immature Granulocytes: 0.07 10*3/uL (ref 0.00–0.07)
Basophils Absolute: 0 10*3/uL (ref 0.0–0.1)
Basophils Relative: 0 %
Eosinophils Absolute: 0.2 10*3/uL (ref 0.0–0.5)
Eosinophils Relative: 1 %
HCT: 27.5 % — ABNORMAL LOW (ref 39.0–52.0)
Hemoglobin: 8.3 g/dL — ABNORMAL LOW (ref 13.0–17.0)
Immature Granulocytes: 1 %
Lymphocytes Relative: 67 %
Lymphs Abs: 9.8 10*3/uL — ABNORMAL HIGH (ref 0.7–4.0)
MCH: 25.9 pg — ABNORMAL LOW (ref 26.0–34.0)
MCHC: 30.2 g/dL (ref 30.0–36.0)
MCV: 85.7 fL (ref 80.0–100.0)
Monocytes Absolute: 0.5 10*3/uL (ref 0.1–1.0)
Monocytes Relative: 4 %
Neutro Abs: 4 10*3/uL (ref 1.7–7.7)
Neutrophils Relative %: 27 %
Platelets: 140 10*3/uL — ABNORMAL LOW (ref 150–400)
RBC: 3.21 MIL/uL — ABNORMAL LOW (ref 4.22–5.81)
RDW: 23.3 % — ABNORMAL HIGH (ref 11.5–15.5)
Smear Review: DECREASED
WBC: 14.6 10*3/uL — ABNORMAL HIGH (ref 4.0–10.5)
nRBC: 0 % (ref 0.0–0.2)

## 2022-12-22 LAB — MAGNESIUM: Magnesium: 2 mg/dL (ref 1.7–2.4)

## 2022-12-22 LAB — GLUCOSE, CAPILLARY
Glucose-Capillary: 167 mg/dL — ABNORMAL HIGH (ref 70–99)
Glucose-Capillary: 157 mg/dL — ABNORMAL HIGH (ref 70–99)
Glucose-Capillary: 134 mg/dL — ABNORMAL HIGH (ref 70–99)
Glucose-Capillary: 143 mg/dL — ABNORMAL HIGH (ref 70–99)

## 2022-12-22 LAB — PROCALCITONIN: Procalcitonin: <0.1 ng/mL

## 2022-12-22 LAB — BASIC METABOLIC PANEL
Anion gap: 7 (ref 5–15)
BUN: 19 mg/dL (ref 8–23)
CO2: 22 mmol/L (ref 22–32)
Calcium: 8.1 mg/dL — ABNORMAL LOW (ref 8.9–10.3)
Chloride: 110 mmol/L (ref 98–111)
Creatinine, Ser: 1.14 mg/dL (ref 0.61–1.24)
GFR, Estimated: >60 mL/min (ref >60)
Glucose, Bld: 241 mg/dL — ABNORMAL HIGH (ref 70–99)
Potassium: 4.3 mmol/L (ref 3.5–5.1)
Sodium: 139 mmol/L (ref 135–145)

## 2022-12-22 LAB — BRAIN NATRIURETIC PEPTIDE: B Natriuretic Peptide: 641.3 pg/mL — ABNORMAL HIGH (ref 0.0–100.0)

## 2022-12-22 LAB — C-REACTIVE PROTEIN: CRP: 1.2 mg/dL — ABNORMAL HIGH (ref <1.0)

## 2022-12-22 NOTE — Progress Notes (Signed)
Subjective: Patient in good spirits this morning. His daughter joined [place] by phone to review case and plan.  Objective: Vital signs in last 24 hours: Temp:  [97.6 F (36.4 C)-98.2 F (36.8 C)] 97.6 F (36.4 C) (09/13 0758) Pulse Rate:  [55-102] 75 (09/13 0758) Resp:  [16-28] 20 (09/13 0758) BP: (112-147)/(50-66) 138/53 (09/13 0758) SpO2:  [96 %-100 %] 96 % (09/13 0758)  Assessment/Plan: #Hematuria- resolved   Continue Flomax and finasteride.  He will appreciate benefit from both of these medications in the long-term but not enough time has been provided for them to fully take effect.  Patient has not yet had cystoscopy.  His hematuria remains undifferentiated. We perform this on an outpatient basis. He does not need to hold anticoagulation for flexible cysto. He sees other providers in the same building and daughter will see that he follows up.   CT hematuria unremarkable.  Blood products noted in bladder.  No obvious lesions  Unfortunately Foley catheter was removed at bedside without being fully deflated.  Pt went back into urinary retention and catheter was replaced.  On examination today he again has clear tea colored urine.  No evidence of active bleeding.  Hospitalist to challenge his Xarelto this morning.  Patient will discharge home with Foley catheter.  Close follow-up to discuss surgical options, if any are available.   Probable discharge tomorrow am per attending.  Please call with questions.  Intake/Output from previous day: 09/12 0701 - 09/13 0700 In: 1280 [P.O.:1280] Out: 2550 [Urine:2550]  Intake/Output this shift: Total I/O In: 420 [P.O.:420] Out: -   Physical Exam:  General: Alert and oriented CV: No cyanosis Lungs: equal chest rise Gu: foley in place draining clear tea colored urine, clear yellow in tubing.   Lab Results: Recent Labs    12/19/22 1433 12/20/22 0402 12/21/22 0322  HGB 9.0* 8.8* 8.3*  HCT 29.4* 28.4* 27.7*   BMET Recent  Labs    12/20/22 0402 12/21/22 0322  NA 138 138  K 3.5 3.6  CL 112* 112*  CO2 20* 19*  GLUCOSE 125* 136*  BUN 21 21  CREATININE 1.24 1.48*  CALCIUM 7.9* 8.0*     Studies/Results: CT HEMATURIA WORKUP  Result Date: 12/21/2022 CLINICAL DATA:  Hematuria EXAM: CT ABDOMEN AND PELVIS WITHOUT AND WITH CONTRAST TECHNIQUE: Multidetector CT imaging of the abdomen and pelvis was performed following the standard protocol before and following the bolus administration of intravenous contrast. RADIATION DOSE REDUCTION: This exam was performed according to the departmental dose-optimization program which includes automated exposure control, adjustment of the mA and/or kV according to patient size and/or use of iterative reconstruction technique. CONTRAST:  OMNIPAQUE IOHEXOL 350 MG/ML SOLN COMPARISON:  02/15/2016 FINDINGS: Lower chest: Lungs are essentially clear. Hepatobiliary: Unenhanced liver is unremarkable. Gallbladder is unremarkable. No intrahepatic or extrahepatic duct dilatation. Pancreas: Parenchymal calcifications, suggesting sequela of chronic pancreatitis. Spleen: Within normal limits. Adrenals/Urinary Tract: Adrenal glands are within normal limits. Dominant 8.1 cm simple left lower pole renal cyst (series 3/image 30), benign. Two probable hemorrhagic cysts in the right lower pole measuring up to 9 mm (series 2/image 30), technically too small to characterize, most likely benign (Bosniak II). No follow-up is recommended. No hydronephrosis. Thick-walled bladder. Layering high density in the bladder favors hemorrhage, less likely an underlying polypoid lesion (series 3/image 68). Stomach/Bowel: Stomach is within normal limits. No evidence of bowel obstruction. Normal appendix (series 3/image 46) Scattered left colonic diverticulosis, without evidence of diverticulitis. Vascular/Lymphatic: No evidence  of abdominal aortic aneurysm. Atherosclerotic calcifications of the abdominal aorta and branch  vessels. No suspicious abdominopelvic lymphadenopathy. Reproductive: Prostatomegaly, with enlargement of the central gland indenting the base the bladder, suggesting BPH. Other: No abdominopelvic ascites. Musculoskeletal: Degenerative changes of the visualized thoracolumbar spine. IMPRESSION: Layering high density in the bladder favors hemorrhage, less likely an underlying polypoid lesion. Cystoscopy is suggested. Prostatomegaly, suggesting BPH. Associated thick-walled bladder, suggesting chronic bladder outlet obstruction. Additional ancillary findings as above. Electronically Signed   By: Charline Bills M.D.   On: 12/21/2022 00:39      LOS: 4 days   Elmon Kirschner, NP [facility] Pager: [phone]  12/22/2022, 10:14 AM

## 2022-12-22 NOTE — Progress Notes (Signed)
Cardiologist:  Allyson Sabal  Subjective:  Denies SSCP, palpitations or Dyspnea Still pending plan from urology   Objective:  Vitals:   12/21/22 2126 12/22/22 0022 12/22/22 0400 12/22/22 0758  BP: (!) 147/58 (!) 137/56 126/65 (!) 138/53  Pulse: 60 (!) 57 (!) 57 75  Resp:  18 18 20   Temp:  97.7 F (36.5 C) 97.8 F (36.6 C) 97.6 F (36.4 C)  TempSrc:  Axillary Axillary Oral  SpO2:  98%  96%    Intake/Output from previous day:  Intake/Output Summary (Last 24 hours) at 12/22/2022 0934 Last data filed at 12/22/2022 0820 Gross per 24 hour  Intake 1060 ml  Output 2550 ml  Net -1490 ml    Physical Exam: Elderly frail male PPM under left clavicle SEM Lipoma over right chest Post sternotomy Abdomen benign No edema Foley with blood tinged urine being irrigated   Lab Results: Basic Metabolic Panel: Recent Labs    12/20/22 0402 12/21/22 0322  NA 138 138  K 3.5 3.6  CL 112* 112*  CO2 20* 19*  GLUCOSE 125* 136*  BUN 21 21  CREATININE 1.24 1.48*  CALCIUM 7.9* 8.0*  MG 1.8 2.1    CBC: Recent Labs    12/20/22 0402 12/21/22 0322  WBC 17.0* 15.8*  NEUTROABS 4.5 7.4  HGB 8.8* 8.3*  HCT 28.4* 27.7*  MCV 83.0 81.7  PLT 127* 139*    Anemia Panel: Recent Labs    12/19/22 1433 12/19/22 1438  VITAMINB12  --  508  FOLATE  --  10.3  FERRITIN  --  136  TIBC  --  258  IRON  --  218*  RETICCTPCT 2.2  --     Imaging: CT HEMATURIA WORKUP  Result Date: 12/21/2022 CLINICAL DATA:  Hematuria EXAM: CT ABDOMEN AND PELVIS WITHOUT AND WITH CONTRAST TECHNIQUE: Multidetector CT imaging of the abdomen and pelvis was performed following the standard protocol before and following the bolus administration of intravenous contrast. RADIATION DOSE REDUCTION: This exam was performed according to the departmental dose-optimization program which includes automated exposure control, adjustment of the mA and/or kV according to patient size and/or use of iterative reconstruction technique.  CONTRAST:  OMNIPAQUE IOHEXOL 350 MG/ML SOLN COMPARISON:  02/15/2016 FINDINGS: Lower chest: Lungs are essentially clear. Hepatobiliary: Unenhanced liver is unremarkable. Gallbladder is unremarkable. No intrahepatic or extrahepatic duct dilatation. Pancreas: Parenchymal calcifications, suggesting sequela of chronic pancreatitis. Spleen: Within normal limits. Adrenals/Urinary Tract: Adrenal glands are within normal limits. Dominant 8.1 cm simple left lower pole renal cyst (series 3/image 30), benign. Two probable hemorrhagic cysts in the right lower pole measuring up to 9 mm (series 2/image 30), technically too small to characterize, most likely benign (Bosniak II). No follow-up is recommended. No hydronephrosis. Thick-walled bladder. Layering high density in the bladder favors hemorrhage, less likely an underlying polypoid lesion (series 3/image 68). Stomach/Bowel: Stomach is within normal limits. No evidence of bowel obstruction. Normal appendix (series 3/image 46) Scattered left colonic diverticulosis, without evidence of diverticulitis. Vascular/Lymphatic: No evidence of abdominal aortic aneurysm. Atherosclerotic calcifications of the abdominal aorta and branch vessels. No suspicious abdominopelvic lymphadenopathy. Reproductive: Prostatomegaly, with enlargement of the central gland indenting the base the bladder, suggesting BPH. Other: No abdominopelvic ascites. Musculoskeletal: Degenerative changes of the visualized thoracolumbar spine. IMPRESSION: Layering high density in the bladder favors hemorrhage, less likely an underlying polypoid lesion. Cystoscopy is suggested. Prostatomegaly, suggesting BPH. Associated thick-walled bladder, suggesting chronic bladder outlet obstruction. Additional ancillary findings as above. Electronically Signed   By: Lurlean Horns  Rito Ehrlich M.D.   On: 12/21/2022 00:39    Cardiac Studies:  ECG: A pacing no acute changes    Telemetry:  a pacing   Echo: EF 45-50% mild AR moderate  bi atrial enlargement   Medications:    atorvastatin  80 mg Oral q1800   Chlorhexidine Gluconate Cloth  6 each Topical Daily   empagliflozin  10 mg Oral Daily   finasteride  5 mg Oral Daily   insulin aspart  0-9 Units Subcutaneous TID WC   isosorbide mononitrate  30 mg Oral Daily   melatonin  5 mg Oral QHS   metoprolol tartrate  25 mg Oral BID   pantoprazole  40 mg Oral Daily   ranolazine  500 mg Oral BID   rivaroxaban  15 mg Oral Daily   sodium chloride flush  3 mL Intravenous Q12H   tamsulosin  0.4 mg Oral BID      cefTRIAXone (ROCEPHIN)  IV 2 g (12/21/22 1504)   sodium chloride irrigation 0 mL (12/18/22 1455)    Assessment/Plan:  CABG/CAD:  known occlusion of LCX SVG, recent occlusion SVG RCA not intervened on 70% stenosis in proximal stent to Diagonal and patent LIMA Like start 81 mg ASA when hematuria resolves Resume home lopressor at lower dose 50 mg BID has PPM back up, ranexa and imdur at lower dose 30 mg daily PAF:  in sinus with A pacing hold xarelto for now no need for heparin  CHF:  received iv lasixx 1 . EF 45-50% volume ok hold entresto for now  Hematuria:  per urology foley irrigated may need cystoscopy ok to proceed from cardiac perspective here or at [facility] Continue proscar and flomax Covered with Rocephin Some bladder spasm clinically  DM:  continue Jardiance given cardiac issues CLL :  followed by Leonides Schanz PLT 127  Anemia:  post transfusion 2 unitis Hct 27.7 Consider transfusion of another unit   Xarelto 15 mg resumed today would also d/c /start ASA 81 mg when possible given old CABG and SVG disease    Charlton Haws 12/22/2022, 9:34 AM

## 2022-12-22 NOTE — Progress Notes (Addendum)
PROGRESS NOTE                                                                                                                                                                                                             Patient Demographics:    Robert Burns, is a 82 y.o. male, DOB - 08-17-1940, ONG:[id number]  Outpatient Primary MD for the patient is Thana Ates, MD    LOS - 4  Admit date - 12/18/2022    Chief Complaint  Patient presents with   Dizziness   Hematuria       Brief Narrative (HPI from H&P)   82 y.o. male with medical history significant of hypertension, hyperlipidemia, CAD s/p CABG with multiple PCI's, PAF, sick sinus syndrome s/p pacemaker, CLL, and BPH with urinary retention who presents with complaints of blood in urine.  He had just recently been hospitalized 8/22-8/24 with NSTEMI high-sensitivity troponins elevated up to 931.    Left heart cath showed occluded SVG to RCA, but was unamenable to intervention and medical management was recommended, he was discharged from the hospital on 12/02/2022, subsequently patient developed urinary retention was hospitalized at [facility] health 12/06/2021 he had Foley catheter placed and discharged home on 12/12/2021 with Foley catheter, Xarelto, Plavix and ongoing hematuria.  Subsequently saw his cardiologist and at that time Plavix was discontinued, unfortunately he continued to have hematuria and started developing weakness and dizziness presented to the hospital with severe hypotension, worsening anemia and hematuria and was admitted.   Subjective:   Patient in bed, appears comfortable, denies any headache, no fever, no chest pain or pressure, no shortness of breath , no abdominal pain. No new focal weakness.   Assessment  & Plan :   Sepsis due to UTI in the presence of indwelling Foley catheter present on admission, ongoing hematuria present on admission - he has been seen  by urology, currently having three-way bladder irrigation, urine getting clear on 12/20/2022, Plavix was discontinued a week ago by cardiology, Xarelto currently on hold, bleeding is to have resolved, will resume Xarelto with caution 12/22/2022 morning.  Remained stable on Xarelto without worsening of bleed likely discharge on 12/23/2022.  Foley catheter indwelling which was present on admission, it was discontinued 12/20/2022 however he retained again and Foley was reinserted midnight 12/21/2022, on Flomax, it is post 2 units  of packed RBC transfusion on 12/19/2022.  On IV antibiotics with improving sepsis pathophysiology, continue to monitor cultures, CBC, appreciate urology input.  Bleeding improved see Xarelto above.   Acute blood loss anemia underlying history of CLL.  Anemia panel pending 2 units of packed RBC on 12/19/2022 blood loss plan as in #1 above.  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  Hypotension, prerenal azotemia due to sepsis and anemia, AKI.  All improving with IV fluids, transfusion and antibiotics.  Gentle hydration again on 12/21/2022, monitor AKI.  CAD s/p CABG and multiple PCI's.  Plavix recently discontinued, chest pain-free, appreciate cardiology follow-up.  No acute issues.  Try to keep hemoglobin above 8.  Statin continued, beta-blocker as tolerated by blood pressure.  No antiplatelets due to severe hematuria.  Paroxysmal atrial fibrillation, sick sinus syndrome s/p pacemaker.  [place] vas 2 score of greater than 3.  Beta-blocker, cardiology on board.  Xarelto as above.  Dyslipidemia.  On statin.  DM type II.  Oral glipizide and metformin held.  SSI.  Lab Results  Component Value Date   HGBA1C 6.6 (H) 11/30/2022   CBG (last 3)  Recent Labs    12/21/22 1234 12/21/22 1630 12/21/22 2129  GLUCAP 119* 102* 145*         Condition -  Guarded  Family Communication  :  daughter Alvis Lemmings [phone]  on 12/19/2022, 12/20/22, 12/21/2022, 12/22/2022 message left 7:36 AM  Code  Status : Full code  Consults  : Cardiology, urology  PUD Prophylaxis : PPI   Procedures  :           Disposition Plan  :    Status is: Inpatient   DVT Prophylaxis  :    Place TED hose Start: 12/20/22 0946 SCDs Start: 12/18/22 1524 Rivaroxaban (XARELTO) tablet 15 mg  Lab Results  Component Value Date   PLT 139 (L) 12/21/2022    Diet :  Diet Order             Diet heart healthy/carb modified Room service appropriate? Yes; Fluid consistency: Thin  Diet effective now                    Inpatient Medications  Scheduled Meds:  atorvastatin  80 mg Oral q1800   Chlorhexidine Gluconate Cloth  6 each Topical Daily   empagliflozin  10 mg Oral Daily   finasteride  5 mg Oral Daily   insulin aspart  0-9 Units Subcutaneous TID WC   isosorbide mononitrate  30 mg Oral Daily   melatonin  5 mg Oral QHS   metoprolol tartrate  25 mg Oral BID   pantoprazole  40 mg Oral Daily   ranolazine  500 mg Oral BID   rivaroxaban  15 mg Oral Daily   sodium chloride flush  3 mL Intravenous Q12H   tamsulosin  0.4 mg Oral BID   Continuous Infusions:  cefTRIAXone (ROCEPHIN)  IV 2 g (12/21/22 1504)   sodium chloride irrigation 0 mL (12/18/22 1455)   PRN Meds:.acetaminophen **OR** acetaminophen, albuterol, HYDROcodone-acetaminophen    Objective:   Vitals:   12/21/22 1947 12/21/22 2126 12/22/22 0022 12/22/22 0400  BP:  (!) 147/58 (!) 137/56 126/65  Pulse:  60 (!) 57 (!) 57  Resp:   18 18  Temp: 98.2 F (36.8 C)  97.7 F (36.5 C) 97.8 F (36.6 C)  TempSrc: Oral  Axillary Axillary  SpO2:   98%     Wt Readings from Last 3 Encounters:  12/14/22  74.8 kg  12/13/22 75.7 kg  12/07/22 77.1 kg     Intake/Output Summary (Last 24 hours) at 12/22/2022 0733 Last data filed at 12/22/2022 0532 Gross per 24 hour  Intake 1280 ml  Output 2550 ml  Net -1270 ml     Physical Exam  Awake Alert, No new F.N deficits, Normal affect, Foley with clear urine [place].AT,PERRAL Supple Neck, No  JVD,   Symmetrical Chest wall movement, Good air movement bilaterally, CTAB RRR,No Gallops,Rubs or new Murmurs,  +ve B.Sounds, Abd Soft, No tenderness,   No Cyanosis, Clubbing or edema     Data Review:    Recent Labs  Lab 12/18/22 1128 12/18/22 1600 12/18/22 2131 12/19/22 0328 12/19/22 1433 12/20/22 0402 12/21/22 0322  WBC 23.2*  --   --  20.7* 19.2* 17.0* 15.8*  HGB 9.3*   < > 7.7* 7.0* 9.0* 8.8* 8.3*  HCT 32.7*   < > 26.7* 23.9* 29.4* 28.4* 27.7*  PLT 180  --   --  143* 138* 127* 139*  MCV 84.3  --   --  79.1* 81.7 83.0 81.7  MCH 24.0*  --   --  23.2* 25.0* 25.7* 24.5*  MCHC 28.4*  --   --  29.3* 30.6 31.0 30.0  RDW 23.1*  --   --  23.0* 21.1* 21.5* 22.5*  LYMPHSABS 10.7*  --   --   --   --  10.9* 6.6*  MONOABS 0.2  --   --   --   --  1.5* 0.9  EOSABS 0.0  --   --   --   --  0.1 0.5  BASOSABS 0.0  --   --   --   --  0.1 0.2*   < > = values in this interval not displayed.    Recent Labs  Lab 12/18/22 1128 12/18/22 1148 12/18/22 1346 12/19/22 0328 12/19/22 1438 12/20/22 0402 12/21/22 0322  NA 139  --   --  136  --  138 138  K 5.0  --   --  4.1  --  3.5 3.6  CL 110  --   --  111  --  112* 112*  CO2 17*  --   --  19*  --  20* 19*  ANIONGAP 12  --   --  6  --  6 7  GLUCOSE 101*  --   --  99  --  125* 136*  BUN 21  --   --  25*  --  21 21  CREATININE 1.25*  --   --  1.17  --  1.24 1.48*  AST 17  --   --   --   --   --   --   ALT 15  --   --   --   --   --   --   ALKPHOS 51  --   --   --   --   --   --   BILITOT 0.3  --   --   --   --   --   --   ALBUMIN 3.1*  --   --   --   --   --   --   CRP  --   --   --   --  1.5* 1.7* 1.5*  PROCALCITON  --   --   --   --  <0.10 <0.10 <0.10  LATICACIDVEN  --  3.3* 3.1*  --   --   --   --  INR 2.8*  --   --   --   --   --   --   BNP  --   --   --  234.3*  --  277.0* 320.4*  MG  --   --   --   --   --  1.8 2.1  CALCIUM 8.2*  --   --  7.9*  --  7.9* 8.0*    Micro Results Recent Results (from the past 240 hour(s))  Blood  Culture (routine x 2)     Status: None (Preliminary result)   Collection Time: 12/18/22 11:28 AM   Specimen: BLOOD  Result Value Ref Range Status   Specimen Description BLOOD RIGHT ANTECUBITAL  Final   Special Requests   Final    BOTTLES DRAWN AEROBIC ONLY Blood Culture results may not be optimal due to an excessive volume of blood received in culture bottles   Culture   Final    NO GROWTH 3 DAYS Performed at [facility], [address], [place], [place] [address]    Report Status PENDING  Incomplete  Urine Culture     Status: Abnormal   Collection Time: 12/18/22 11:28 AM   Specimen: Urine, Random  Result Value Ref Range Status   Specimen Description URINE, RANDOM  Final   Special Requests   Final    NONE Reflexed from [id number] Performed at [facility], [address] [place], [place] [address]    Culture MULTIPLE SPECIES PRESENT, SUGGEST RECOLLECTION (A)  Final   Report Status 12/20/2022 FINAL  Final  Blood Culture (routine x 2)     Status: None (Preliminary result)   Collection Time: 12/18/22 12:00 PM   Specimen: BLOOD LEFT HAND  Result Value Ref Range Status   Specimen Description BLOOD LEFT HAND  Final   Special Requests   Final    BOTTLES DRAWN AEROBIC ONLY Blood Culture adequate volume   Culture   Final    NO GROWTH 3 DAYS Performed at [facility], [address] [place], [place] [address]    Report Status PENDING  Incomplete    Radiology Reports CT HEMATURIA WORKUP  Result Date: 12/21/2022 CLINICAL DATA:  Hematuria EXAM: CT ABDOMEN AND PELVIS WITHOUT AND WITH CONTRAST TECHNIQUE: Multidetector CT imaging of the abdomen and pelvis was performed following the standard protocol before and following the bolus administration of intravenous contrast. RADIATION DOSE REDUCTION: This exam was performed according to the departmental dose-optimization program which includes automated exposure control, adjustment of the mA and/or kV according to patient size  and/or use of iterative reconstruction technique. CONTRAST:  OMNIPAQUE IOHEXOL 350 MG/ML SOLN COMPARISON:  02/15/2016 FINDINGS: Lower chest: Lungs are essentially clear. Hepatobiliary: Unenhanced liver is unremarkable. Gallbladder is unremarkable. No intrahepatic or extrahepatic duct dilatation. Pancreas: Parenchymal calcifications, suggesting sequela of chronic pancreatitis. Spleen: Within normal limits. Adrenals/Urinary Tract: Adrenal glands are within normal limits. Dominant 8.1 cm simple left lower pole renal cyst (series 3/image 30), benign. Two probable hemorrhagic cysts in the right lower pole measuring up to 9 mm (series 2/image 30), technically too small to characterize, most likely benign (Bosniak II). No follow-up is recommended. No hydronephrosis. Thick-walled bladder. Layering high density in the bladder favors hemorrhage, less likely an underlying polypoid lesion (series 3/image 68). Stomach/Bowel: Stomach is within normal limits. No evidence of bowel obstruction. Normal appendix (series 3/image 46) Scattered left colonic diverticulosis, without evidence of diverticulitis. Vascular/Lymphatic: No evidence of abdominal aortic aneurysm. Atherosclerotic calcifications  of the abdominal aorta and branch vessels. No suspicious abdominopelvic lymphadenopathy. Reproductive: Prostatomegaly, with enlargement of the central gland indenting the base the bladder, suggesting BPH. Other: No abdominopelvic ascites. Musculoskeletal: Degenerative changes of the visualized thoracolumbar spine. IMPRESSION: Layering high density in the bladder favors hemorrhage, less likely an underlying polypoid lesion. Cystoscopy is suggested. Prostatomegaly, suggesting BPH. Associated thick-walled bladder, suggesting chronic bladder outlet obstruction. Additional ancillary findings as above. Electronically Signed   By: Charline Bills M.D.   On: 12/21/2022 00:39   DG Chest Port 1 View  Result Date: 12/18/2022 CLINICAL DATA:   Dizziness. Hematuria. Possible sepsis. Hypotension. Short of breath. EXAM: PORTABLE CHEST 1 VIEW COMPARISON:  11/30/2022 FINDINGS: Previous median sternotomy and CABG. Dual lead pacemaker with leads in the region of the right atrium and right ventricle. The pulmonary vascularity is normal. The lungs are clear. No visible effusion. No acute bone finding. IMPRESSION: No active disease. Previous CABG. Dual lead pacemaker. Electronically Signed   By: Paulina Fusi M.D.   On: 12/18/2022 12:47      Signature  -   Susa Raring M.D on 12/22/2022 at 7:33 AM   -  To page go to [email or web address]

## 2022-12-22 NOTE — Progress Notes (Signed)
Tele called about pm not sensing. Patient denies complaints. MP shows paced and sensing. MD made aware. BP in the `40's systolic.

## 2022-12-23 ENCOUNTER — Other Ambulatory Visit (HOSPITAL_COMMUNITY): Payer: Self-pay

## 2022-12-23 DIAGNOSIS — R6520 Severe sepsis without septic shock: Secondary | ICD-10-CM | POA: Diagnosis not present

## 2022-12-23 DIAGNOSIS — A419 Sepsis, unspecified organism: Secondary | ICD-10-CM | POA: Diagnosis not present

## 2022-12-23 LAB — CBC WITH DIFFERENTIAL/PLATELET
Abs Immature Granulocytes: 0 10*3/uL (ref 0.00–0.07)
Basophils Absolute: 0 10*3/uL (ref 0.0–0.1)
Basophils Relative: 0 %
Eosinophils Absolute: 0.3 10*3/uL (ref 0.0–0.5)
Eosinophils Relative: 2 %
HCT: 28.6 % — ABNORMAL LOW (ref 39.0–52.0)
Hemoglobin: 8.9 g/dL — ABNORMAL LOW (ref 13.0–17.0)
Lymphocytes Relative: 48 %
Lymphs Abs: 7.4 10*3/uL — ABNORMAL HIGH (ref 0.7–4.0)
MCH: 26.1 pg (ref 26.0–34.0)
MCHC: 31.1 g/dL (ref 30.0–36.0)
MCV: 83.9 fL (ref 80.0–100.0)
Monocytes Absolute: 0.2 10*3/uL (ref 0.1–1.0)
Monocytes Relative: 1 %
Neutro Abs: 7.5 10*3/uL (ref 1.7–7.7)
Neutrophils Relative %: 49 %
Platelets: 142 10*3/uL — ABNORMAL LOW (ref 150–400)
RBC: 3.41 MIL/uL — ABNORMAL LOW (ref 4.22–5.81)
RDW: 23.3 % — ABNORMAL HIGH (ref 11.5–15.5)
WBC: 15.4 10*3/uL — ABNORMAL HIGH (ref 4.0–10.5)
nRBC: 0 % (ref 0.0–0.2)
nRBC: 0 /100{WBCs}

## 2022-12-23 LAB — BRAIN NATRIURETIC PEPTIDE: B Natriuretic Peptide: 676.9 pg/mL — ABNORMAL HIGH (ref 0.0–100.0)

## 2022-12-23 LAB — BASIC METABOLIC PANEL
Anion gap: 9 (ref 5–15)
BUN: 19 mg/dL (ref 8–23)
CO2: 21 mmol/L — ABNORMAL LOW (ref 22–32)
Calcium: 8.3 mg/dL — ABNORMAL LOW (ref 8.9–10.3)
Chloride: 111 mmol/L (ref 98–111)
Creatinine, Ser: 1.08 mg/dL (ref 0.61–1.24)
GFR, Estimated: >60 mL/min (ref >60)
Glucose, Bld: 130 mg/dL — ABNORMAL HIGH (ref 70–99)
Potassium: 3.8 mmol/L (ref 3.5–5.1)
Sodium: 141 mmol/L (ref 135–145)

## 2022-12-23 LAB — GLUCOSE, CAPILLARY
Glucose-Capillary: 202 mg/dL — ABNORMAL HIGH (ref 70–99)
Glucose-Capillary: 118 mg/dL — ABNORMAL HIGH (ref 70–99)

## 2022-12-23 LAB — CULTURE, BLOOD (ROUTINE X 2)
Culture: NO GROWTH
Culture: NO GROWTH
Special Requests: ADEQUATE

## 2022-12-23 LAB — MAGNESIUM: Magnesium: 2 mg/dL (ref 1.7–2.4)

## 2022-12-23 LAB — C-REACTIVE PROTEIN: CRP: 0.8 mg/dL (ref <1.0)

## 2022-12-23 LAB — PROCALCITONIN: Procalcitonin: <0.1 ng/mL

## 2022-12-23 MED ORDER — SODIUM CHLORIDE 0.9 % IR SOLN: 3000.0000 mL | Status: DC

## 2022-12-23 MED ORDER — METOPROLOL TARTRATE 100 MG PO TABS: 100.0000 mg | ORAL_TABLET | Freq: Two times a day (BID) | ORAL | Status: DC

## 2022-12-23 MED ORDER — CEPHALEXIN 500 MG PO CAPS
500.0000 mg | ORAL_CAPSULE | Freq: Three times a day (TID) | ORAL | 0 refills | Status: AC
  Filled 2022-12-23: qty 12, 4d supply, fill #0

## 2022-12-23 MED ORDER — SACUBITRIL-VALSARTAN 49-51 MG PO TABS
1.0000 | ORAL_TABLET | Freq: Two times a day (BID) | ORAL | Status: DC
  Administered 2022-12-23: 1 via ORAL
  Filled 2022-12-23 (×2): qty 1

## 2022-12-23 NOTE — TOC Transition Note (Signed)
Transition of Care [facility]) - CM/SW Discharge Note   Patient Details  Name: Robert Burns MRN: [id number] Date of Birth: 05-25-1940  Transition of Care [facility] CM/SW Contact:  Lawerance Sabal, RN Phone Number: 12/23/2022, 9:14 AM   Clinical Narrative:     Sherron Monday w patient's daughter Alvis Lemmings, she is agreeable to [facility] services, deferes choice to CM. Frances Furbish accepted referral will call [name] to schedule . No other TOC needs at this time   Final next level of care: Home w Home Health Services Barriers to Discharge: No Barriers Identified   Patient Goals and CMS Choice CMS Medicare.gov Compare Post Acute Care list provided to:: Other (Comment Required) Choice offered to / list presented to : Adult Children  Discharge Placement                         Discharge Plan and Services Additional resources added to the After Visit Summary for                  DME Arranged: N/A         HH Arranged: PT, Nurse's Aide, RN [facility] Agency: [facility] Date [facility] Agency Contacted: 12/23/22 Time HH Agency Contacted: [phone] Representative spoke with at [facility] Agency: Kandee Keen  Social Determinants of Health (SDOH) Interventions SDOH Screenings   Food Insecurity: No Food Insecurity (12/18/2022)  Housing: Patient Declined (12/18/2022)  Transportation Needs: No Transportation Needs (12/18/2022)  Utilities: Not At Risk (12/18/2022)  Social Connections: Unknown (08/18/2021)   Received from [facility]  Stress: No Stress Concern Present (12/07/2022)   Received from [facility]  Tobacco Use: Medium Risk (12/18/2022)     Readmission Risk Interventions     No data to display

## 2022-12-23 NOTE — Progress Notes (Signed)
Pt son and pt educated on foley irrigation. Return demonstration by son completed.

## 2022-12-23 NOTE — Discharge Summary (Signed)
Robert Burns WUJ:[id number] DOB: 1940/07/07 DOA: 12/18/2022  PCP: Thana Ates, MD  Admit date: 12/18/2022  Discharge date: 12/23/2022  Admitted From: Home   Disposition:  Home   Recommendations for Outpatient Follow-up:   Follow up with PCP in 1-2 weeks  PCP Please obtain BMP/CBC, 2 view CXR in 1week,  (see Discharge instructions)   PCP Please follow up on the following pending results: Close outpatient follow-up with urology in 1 to 2 weeks and his cardiologist.   Home Health: PT, RN, aide if qualifies Equipment/Devices: Will be discharged with Foley catheter Consultations: Cards, Urology Discharge Condition: Stable    CODE STATUS: Full    Diet Recommendation: Heart Healthy - low carbohydrate, 1.5 L fluid restriction per day  Chief Complaint  Patient presents with   Dizziness   Hematuria     Brief history of present illness from the day of admission and additional interim summary    82 y.o. male with medical history significant of hypertension, hyperlipidemia, CAD s/p CABG with multiple PCI's, PAF, sick sinus syndrome s/p pacemaker, CLL, and BPH with urinary retention who presents with complaints of blood in urine.  He had just recently been hospitalized 8/22-8/24 with NSTEMI high-sensitivity troponins elevated up to 931.     Left heart cath showed occluded SVG to RCA, but was unamenable to intervention and medical management was recommended, he was discharged from the hospital on 12/02/2022, subsequently patient developed urinary retention was hospitalized at [facility] health 12/06/2021 he had Foley catheter placed and discharged home on 12/12/2021 with Foley catheter, Xarelto, Plavix and ongoing hematuria.   Subsequently saw his cardiologist and at that time Plavix was discontinued, unfortunately he continued to  have hematuria and started developing weakness and dizziness presented to the hospital with severe hypotension, worsening anemia and hematuria and was admitted.                                                                 Hospital Course   Sepsis due to UTI in the presence of indwelling Foley catheter present on admission, ongoing hematuria present on admission - he has been seen by urology, currently having three-way bladder irrigation, Plavix was discontinued in the outpatient setting a week before admission and it was continued to be held, Xarelto was held for few days, he was seen by urology and cardiology.  He failed Foley catheter trial removal and Foley had to be reinserted, he also received 2 units of packed RBCs on 12/19/2022 after which his CBCs are stable.  Sepsis pathophysiology has resolved.  His bleeding is to have resolved, Xarelto was resumed 12/22/2022, no further bleeding, urine clear in Foley catheter, will be discharged with Foley catheter and his dual home alpha blockers with outpatient PCP and urology follow-up.  Family and patient educated  about how to flush his Foley 3 times a day and to care for the Foley catheter.     Acute blood loss anemia underlying history of CLL.  Received 2 units of packed RBC as above, CBC now stable, PCP to monitor.  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   Hypotension, prerenal azotemia due to sepsis and anemia, AKI.  Resolved after hydration with IV fluids, blood pressure medications now being reintroduced gradually, full dose Lopressor today, Entresto from tomorrow at home.   CAD s/p CABG and multiple PCI's.  Plavix recently discontinued, chest pain-free, appreciate cardiology follow-up.  No acute issues.  Try to keep hemoglobin above 8.  Statin continued, beta-blocker as tolerated by blood pressure.  No antiplatelets due to severe hematuria.   Paroxysmal atrial fibrillation, sick sinus syndrome s/p pacemaker.  [place] vas 2 score of greater than 3.   Beta-blocker, cardiology on board.  Xarelto as above.   Dyslipidemia.  On statin.   DM type II.  Continue home regimen no acute issues  Discharge diagnosis     Principal Problem:   Sepsis (HCC) Active Problems:   Urinary tract infection with hematuria   Transient hypotension   CLL (chronic lymphocytic leukemia) (HCC)   Iron deficiency   Renal insufficiency   SSS (sick sinus syndrome), medtronic adapta   PAF (paroxysmal atrial fibrillation) (HCC)   Type 2 diabetes mellitus with complication, without long-term current use of insulin (HCC)   CAD, multiple vessel   Dyslipidemia (high LDL; low HDL)   Hx of CABG    Discharge instructions    Discharge Instructions     Discharge instructions   Complete by: As directed    Flush Foley catheter with the syringe provided to you upon discharge with sterile water every 8 hours.  Follow with Primary MD Thana Ates, MD in 7 days   Get CBC, CMP, 2 view Chest X ray -  checked next visit with your primary MD   Activity: As tolerated with Full fall precautions use walker/cane & assistance as needed  Disposition Home   Diet: Heart Healthy low carbohydrate, 1.5 L fluid restriction per day.  Check CBGs q. ACH S.  Special Instructions: If you have smoked or chewed Tobacco  in the last 2 yrs please stop smoking, stop any regular Alcohol  and or any Recreational drug use.  On your next visit with your primary care physician please Get Medicines reviewed and adjusted.  Please request your Prim.MD to go over all Hospital Tests and Procedure/Radiological results at the follow up, please get all Hospital records sent to your Prim MD by signing hospital release before you go home.  If you experience worsening of your admission symptoms, develop shortness of breath, life threatening emergency, suicidal or homicidal thoughts you must seek medical attention immediately by calling 911 or calling your MD immediately  if symptoms less severe.  You  Must read complete instructions/literature along with all the possible adverse reactions/side effects for all the Medicines you take and that have been prescribed to you. Take any new Medicines after you have completely understood and accpet all the possible adverse reactions/side effects.   Increase activity slowly   Complete by: As directed        Discharge Medications   Allergies as of 12/23/2022       Reactions   Peanut-containing Drug Products Anaphylaxis, Other (See Comments)   Tongue swelling is severe   Ace Inhibitors Cough      Diltiazem Hcl  Other (See Comments)   UNSPECIFIED REACTION    Meloxicam Other (See Comments)   GI upset   Doxycycline Other (See Comments), Cough   Reaction of cough and runny nose   Eliquis [apixaban] Other (See Comments)   dizziness   Sertraline Hcl Other (See Comments)   Carvedilol Itching   Clonidine Hcl Other (See Comments)   Patch only - skin irritation   Duloxetine Hcl Other (See Comments)   Urinary frequency        Medication List     TAKE these medications    atorvastatin 80 MG tablet Commonly known as: LIPITOR Take 1 tablet (80 mg total) by mouth daily at 6 PM.   cephALEXin 500 MG capsule Commonly known as: KEFLEX Take 1 capsule (500 mg total) by mouth 3 (three) times daily for 4 days.   empagliflozin 10 MG Tabs tablet Commonly known as: JARDIANCE Take 1 tablet (10 mg total) by mouth daily.   finasteride 5 MG tablet Commonly known as: PROSCAR Take 5 mg by mouth daily.   glipiZIDE 5 MG tablet Commonly known as: GLUCOTROL Take 5 mg by mouth daily with breakfast.   HYDROcodone-acetaminophen 5-325 MG tablet Commonly known as: NORCO/VICODIN Take 1 tablet by mouth every 6 (six) hours as needed for moderate pain.   isosorbide mononitrate 60 MG 24 hr tablet Commonly known as: IMDUR Take 1.5 tablets (90 mg total) by mouth daily.   melatonin 5 MG Tabs Take 5 mg by mouth at bedtime.   metFORMIN 1000 MG  tablet Commonly known as: GLUCOPHAGE Take 1 tablet (1,000 mg total) by mouth 2 (two) times daily with a meal. Restart on 3/18.   metoprolol tartrate 100 MG tablet Commonly known as: LOPRESSOR Take 1 tablet (100 mg total) by mouth 2 (two) times daily.   MULTIPLE VITAMIN-FOLIC ACID PO Take 1 tablet by mouth daily.   nitroGLYCERIN 0.4 MG SL tablet Commonly known as: Nitrostat Place 1 tablet (0.4 mg total) under the tongue every 5 (five) minutes as needed for chest pain.   ranolazine 500 MG 12 hr tablet Commonly known as: RANEXA Take 500 mg by mouth 2 (two) times daily.   rivaroxaban 20 MG Tabs tablet Commonly known as: XARELTO Take 20 mg by mouth daily with supper.   sacubitril-valsartan 49-51 MG Commonly known as: ENTRESTO Take 1 tablet by mouth 2 (two) times daily.   tamsulosin 0.4 MG Caps capsule Commonly known as: FLOMAX Take 0.4 mg by mouth 2 (two) times daily.   Vitamin D 50 MCG (2000 UT) Caps Take 1 capsule by mouth 2 (two) times daily.         Follow-up Information     Thana Ates, MD. Schedule an appointment as soon as possible for a visit in 1 week(s).   Specialty: Internal Medicine Contact information: [address] [place] [address] [phone]         Crist Fat, MD. Schedule an appointment as soon as possible for a visit in 1 week(s).   Specialty: Urology Why: Hematuria, indwelling Foley, requires TURP Contact information: [address] [place] [address] [phone]                 Major procedures and Radiology Reports - PLEASE review detailed and final reports thoroughly  -     CT HEMATURIA WORKUP  Result Date: 12/21/2022 CLINICAL DATA:  Hematuria EXAM: CT ABDOMEN AND PELVIS WITHOUT AND WITH CONTRAST TECHNIQUE: Multidetector CT imaging of the abdomen  and pelvis was performed following the standard protocol before and following the bolus administration of intravenous contrast. RADIATION DOSE  REDUCTION: This exam was performed according to the departmental dose-optimization program which includes automated exposure control, adjustment of the mA and/or kV according to patient size and/or use of iterative reconstruction technique. CONTRAST:  OMNIPAQUE IOHEXOL 350 MG/ML SOLN COMPARISON:  02/15/2016 FINDINGS: Lower chest: Lungs are essentially clear. Hepatobiliary: Unenhanced liver is unremarkable. Gallbladder is unremarkable. No intrahepatic or extrahepatic duct dilatation. Pancreas: Parenchymal calcifications, suggesting sequela of chronic pancreatitis. Spleen: Within normal limits. Adrenals/Urinary Tract: Adrenal glands are within normal limits. Dominant 8.1 cm simple left lower pole renal cyst (series 3/image 30), benign. Two probable hemorrhagic cysts in the right lower pole measuring up to 9 mm (series 2/image 30), technically too small to characterize, most likely benign (Bosniak II). No follow-up is recommended. No hydronephrosis. Thick-walled bladder. Layering high density in the bladder favors hemorrhage, less likely an underlying polypoid lesion (series 3/image 68). Stomach/Bowel: Stomach is within normal limits. No evidence of bowel obstruction. Normal appendix (series 3/image 46) Scattered left colonic diverticulosis, without evidence of diverticulitis. Vascular/Lymphatic: No evidence of abdominal aortic aneurysm. Atherosclerotic calcifications of the abdominal aorta and branch vessels. No suspicious abdominopelvic lymphadenopathy. Reproductive: Prostatomegaly, with enlargement of the central gland indenting the base the bladder, suggesting BPH. Other: No abdominopelvic ascites. Musculoskeletal: Degenerative changes of the visualized thoracolumbar spine. IMPRESSION: Layering high density in the bladder favors hemorrhage, less likely an underlying polypoid lesion. Cystoscopy is suggested. Prostatomegaly, suggesting BPH. Associated thick-walled bladder, suggesting chronic bladder outlet  obstruction. Additional ancillary findings as above. Electronically Signed   By: Charline Bills M.D.   On: 12/21/2022 00:39   DG Chest Port 1 View  Result Date: 12/18/2022 CLINICAL DATA:  Dizziness. Hematuria. Possible sepsis. Hypotension. Short of breath. EXAM: PORTABLE CHEST 1 VIEW COMPARISON:  11/30/2022 FINDINGS: Previous median sternotomy and CABG. Dual lead pacemaker with leads in the region of the right atrium and right ventricle. The pulmonary vascularity is normal. The lungs are clear. No visible effusion. No acute bone finding. IMPRESSION: No active disease. Previous CABG. Dual lead pacemaker. Electronically Signed   By: Paulina Fusi M.D.   On: 12/18/2022 12:47   CARDIAC CATHETERIZATION  Result Date: 12/01/2022   Mid LM lesion is 50% stenosed.   Ost LAD to Prox LAD lesion is 100% stenosed.   Mid LAD-2 lesion is 20% stenosed.   Dist LAD lesion is 25% stenosed.   Ost Cx lesion is 40% stenosed.   Prox Cx lesion is 30% stenosed.   Mid Cx lesion is 75% stenosed.   Ost RCA to Dist RCA lesion is 100% stenosed.   Ost Ramus lesion is 95% stenosed.   Prox Graft to Dist Graft lesion between Ramus and 2nd Mrg  is 100% stenosed.   Origin to Mid Graft lesion before Prox LAD  is 100% stenosed.   Origin to Prox Graft lesion is 100% stenosed.   Ost 3rd Mrg to 3rd Mrg lesion is 99% stenosed.   Prox Graft lesion is 70% stenosed.   Non-stenotic Mid LAD-1 lesion.   Non-stenotic Mid Graft to Dist Graft lesion was previously treated.   Non-stenotic Mid Graft lesion was previously treated.   Non-stenotic Mid Graft to Dist Graft lesion was previously treated.   LV end diastolic pressure is moderately elevated. Severe 3 vessel obstructive CAD Patent LIMA to the LAD Known occlusion of SVG to LCx Patent SVG to diagonal. Multiple prior stents. Within the proximal  stent there is a focal 70% stenosis. Other stented segments look good. Occluded SVG to RCA. This is new. There are some left to right collaterals to distal RCA  Moderately elevated LVEDP 27 mm Hg Plan: SVG to RCA cannot be salvaged. This appears to be the culprit. Recommend continued medical therapy. If refractory angina could consider balloon angioplasty of in stent disease in SVG to diagonal but long term patency would be limited.   ECHOCARDIOGRAM COMPLETE  Result Date: 11/30/2022    ECHOCARDIOGRAM REPORT   Patient Name:   AAYAAN AL Solow Date of Exam: 11/30/2022 Medical Rec #:  [id number]       Height:       67.0 in Accession #:    [id number]      Weight:       163.0 lb Date of Birth:  [birth year]       BSA:          1.854 m Patient Age:    21 years        BP:           139/66 mmHg Patient Gender: M               HR:           85 bpm. Exam Location:  Inpatient Procedure: 2D Echo, Cardiac Doppler, Color Doppler and Intracardiac            Opacification Agent Indications:    NSTEMI  History:        Patient has prior history of Echocardiogram examinations, most                 recent 06/22/2020. CAD, Pacemaker, PAD, Arrythmias:SSS; Risk                 Factors:Diabetes, Hypertension, Dyslipidemia and Sleep Apnea.                 Cancer.  Sonographer:    Milda Smart Referring Phys: [id number] SHENG L HALEY  Sonographer Comments: Image acquisition challenging due to patient body habitus. IMPRESSIONS  1. Left ventricular ejection fraction, by estimation, is 45 to 50%. The left ventricle has mildly decreased function. The left ventricle demonstrates global hypokinesis. The left ventricular internal cavity size was mildly to moderately dilated. Left ventricular diastolic parameters are consistent with Grade II diastolic dysfunction (pseudonormalization).  2. Right ventricular systolic function is normal. The right ventricular size is normal. There is normal pulmonary artery systolic pressure.  3. Left atrial size was moderately dilated.  4. Right atrial size was moderately dilated.  5. The mitral valve is normal in structure. Trivial mitral valve regurgitation. No evidence of  mitral stenosis.  6. The aortic valve is tricuspid. There is mild calcification of the aortic valve. There is mild thickening of the aortic valve. Aortic valve regurgitation is mild.  7. The inferior vena cava is normal in size with greater than 50% respiratory variability, suggesting right atrial pressure of 3 mmHg. Comparison(s): Changes from prior study are noted. Mild to moderately dilated LV with mildly reduced function but no apparent focal wall motion abnormalities. FINDINGS  Left Ventricle: Left ventricular ejection fraction, by estimation, is 45 to 50%. The left ventricle has mildly decreased function. The left ventricle demonstrates global hypokinesis. Definity contrast agent was given IV to delineate the left ventricular  endocardial borders. The left ventricular internal cavity size was mildly to moderately dilated. There is no left ventricular hypertrophy. Left ventricular diastolic parameters are consistent with Grade  II diastolic dysfunction (pseudonormalization). Right Ventricle: The right ventricular size is normal. Right vetricular wall thickness was not well visualized. Right ventricular systolic function is normal. There is normal pulmonary artery systolic pressure. The tricuspid regurgitant velocity is 2.78 m/s, and with an assumed right atrial pressure of 3 mmHg, the estimated right ventricular systolic pressure is 33.9 mmHg. Left Atrium: Left atrial size was moderately dilated. Right Atrium: Right atrial size was moderately dilated. Pericardium: There is no evidence of pericardial effusion. Mitral Valve: The mitral valve is normal in structure. Trivial mitral valve regurgitation. No evidence of mitral valve stenosis. Tricuspid Valve: The tricuspid valve is normal in structure. Tricuspid valve regurgitation is trivial. No evidence of tricuspid stenosis. Aortic Valve: The aortic valve is tricuspid. There is mild calcification of the aortic valve. There is mild thickening of the aortic valve.  Aortic valve regurgitation is mild. Aortic regurgitation PHT measures 605 msec. Pulmonic Valve: The pulmonic valve was grossly normal. Pulmonic valve regurgitation is trivial. No evidence of pulmonic stenosis. Aorta: The aortic root and ascending aorta are structurally normal, with no evidence of dilitation. Venous: The inferior vena cava is normal in size with greater than 50% respiratory variability, suggesting right atrial pressure of 3 mmHg. IAS/Shunts: The atrial septum is grossly normal. Additional Comments: A device lead is visualized.  LEFT VENTRICLE PLAX 2D LVIDd:         5.40 cm   Diastology LVIDs:         4.40 cm   LV e' medial:    4.68 cm/s LV PW:         1.00 cm   LV E/e' medial:  20.0 LV IVS:        1.10 cm   LV e' lateral:   7.51 cm/s LVOT diam:     2.30 cm   LV E/e' lateral: 12.5 LV SV:         90 LV SV Index:   49 LVOT Area:     4.15 cm  RIGHT VENTRICLE             IVC RV S prime:     11.10 cm/s  IVC diam: 2.00 cm TAPSE (M-mode): 1.9 cm LEFT ATRIUM             Index        RIGHT ATRIUM           Index LA diam:        5.30 cm 2.86 cm/m   RA Area:     23.20 cm LA Vol (A2C):   71.6 ml 38.62 ml/m  RA Volume:   69.10 ml  37.27 ml/m LA Vol (A4C):   74.9 ml 40.40 ml/m LA Biplane Vol: 76.7 ml 41.37 ml/m  AORTIC VALVE LVOT Vmax:   85.10 cm/s LVOT Vmean:  62.400 cm/s LVOT VTI:    0.217 m AI PHT:      605 msec  AORTA Ao Root diam: 3.40 cm Ao Asc diam:  3.70 cm MITRAL VALVE                TRICUSPID VALVE MV Area (PHT): 3.51 cm     TR Peak grad:   30.9 mmHg MV Decel Time: 216 msec     TR Mean grad:   23.0 mmHg MR Peak grad: 29.8 mmHg     TR Vmax:        278.00 cm/s MR Vmax:      273.00 cm/s   TR Vmean:  231.0 cm/s MV E velocity: 93.60 cm/s MV A velocity: 105.00 cm/s  SHUNTS MV E/A ratio:  0.89         Systemic VTI:  0.22 m                             Systemic Diam: 2.30 cm Jodelle Red MD Electronically signed by Jodelle Red MD Signature Date/Time: 11/30/2022/4:57:31 PM    Final     DG Chest Portable 1 View  Result Date: 11/30/2022 CLINICAL DATA:  Chest pain EXAM: PORTABLE CHEST 1 VIEW COMPARISON:  X-ray 02/05/2022 FINDINGS: Sternal wires. Right upper chest battery pack with pacemaker leads along the right side of the heart are similar to previous. Minimal left basilar atelectasis. No consolidation, pneumothorax, effusion or edema. Calcified aorta. Overlapping cardiac leads. Degenerative changes of the spine and shoulders. IMPRESSION: Postop chest.  Pacemaker.  Minimal left basilar atelectasis or scar Electronically Signed   By: Karen Kays M.D.   On: 11/30/2022 10:12    Micro Results    Recent Results (from the past 240 hour(s))  Blood Culture (routine x 2)     Status: None (Preliminary result)   Collection Time: 12/18/22 11:28 AM   Specimen: BLOOD  Result Value Ref Range Status   Specimen Description BLOOD RIGHT ANTECUBITAL  Final   Special Requests   Final    BOTTLES DRAWN AEROBIC ONLY Blood Culture results may not be optimal due to an excessive volume of blood received in culture bottles   Culture   Final    NO GROWTH 4 DAYS Performed at [facility], [address] [place], [place] [address]    Report Status PENDING  Incomplete  Urine Culture     Status: Abnormal   Collection Time: 12/18/22 11:28 AM   Specimen: Urine, Random  Result Value Ref Range Status   Specimen Description URINE, RANDOM  Final   Special Requests   Final    NONE Reflexed from [id number] Performed at [facility], [address] [place], [place] [address]    Culture MULTIPLE SPECIES PRESENT, SUGGEST RECOLLECTION (A)  Final   Report Status 12/20/2022 FINAL  Final  Blood Culture (routine x 2)     Status: None (Preliminary result)   Collection Time: 12/18/22 12:00 PM   Specimen: BLOOD LEFT HAND  Result Value Ref Range Status   Specimen Description BLOOD LEFT HAND  Final   Special Requests   Final    BOTTLES DRAWN AEROBIC ONLY Blood Culture adequate volume   Culture    Final    NO GROWTH 4 DAYS Performed at [facility], [address] [place], [place] [address]    Report Status PENDING  Incomplete    Today   Subjective    Maggie Krippner today has no headache,no chest abdominal pain,no new weakness tingling or numbness, feels much better wants to go home today.     Objective   Blood pressure 150/90, pulse 63, temperature 97.6 F (36.4 C), temperature source Oral, resp. rate (!) 23, SpO2 99%.   Intake/Output Summary (Last 24 hours) at 12/23/2022 0743 Last data filed at 12/23/2022 0620 Gross per 24 hour  Intake 1263 ml  Output 3759 ml  Net -2496 ml    Exam  Awake Alert, No new F.N deficits,    [place].AT,PERRAL Supple Neck,   Symmetrical Chest wall movement, Good air movement bilaterally, CTAB RRR,No Gallops,   +ve B.Sounds,  Abd Soft, Non tender,  No Cyanosis, Foley - clear Urine   Data Review   Recent Labs  Lab 12/18/22 1128 12/18/22 1600 12/19/22 1433 12/20/22 0402 12/21/22 0322 12/22/22 1226 12/23/22 0524  WBC 23.2*   < > 19.2* 17.0* 15.8* 14.6* 15.4*  HGB 9.3*   < > 9.0* 8.8* 8.3* 8.3* 8.9*  HCT 32.7*   < > 29.4* 28.4* 27.7* 27.5* 28.6*  PLT 180   < > 138* 127* 139* 140* 142*  MCV 84.3   < > 81.7 83.0 81.7 85.7 83.9  MCH 24.0*   < > 25.0* 25.7* 24.5* 25.9* 26.1  MCHC 28.4*   < > 30.6 31.0 30.0 30.2 31.1  RDW 23.1*   < > 21.1* 21.5* 22.5* 23.3* 23.3*  LYMPHSABS 10.7*  --   --  10.9* 6.6* 9.8* 7.4*  MONOABS 0.2  --   --  1.5* 0.9 0.5 0.2  EOSABS 0.0  --   --  0.1 0.5 0.2 0.3  BASOSABS 0.0  --   --  0.1 0.2* 0.0 0.0   < > = values in this interval not displayed.    Recent Labs  Lab 12/18/22 1128 12/18/22 1148 12/18/22 1346 12/19/22 0328 12/19/22 1438 12/20/22 0402 12/21/22 0322 12/22/22 1226 12/23/22 0524  NA 139  --   --  136  --  138 138 139 141  K 5.0  --   --  4.1  --  3.5 3.6 4.3 3.8  CL 110  --   --  111  --  112* 112* 110 111  CO2 17*  --   --  19*  --  20* 19* 22 21*  ANIONGAP 12  --   --  6  --   6 7 7 9   GLUCOSE 101*  --   --  99  --  125* 136* 241* 130*  BUN 21  --   --  25*  --  21 21 19 19   CREATININE 1.25*  --   --  1.17  --  1.24 1.48* 1.14 1.08  AST 17  --   --   --   --   --   --   --   --   ALT 15  --   --   --   --   --   --   --   --   ALKPHOS 51  --   --   --   --   --   --   --   --   BILITOT 0.3  --   --   --   --   --   --   --   --   ALBUMIN 3.1*  --   --   --   --   --   --   --   --   CRP  --   --   --   --  1.5* 1.7* 1.5* 1.2* 0.8  PROCALCITON  --   --   --   --  <0.10 <0.10 <0.10 <0.10 <0.10  LATICACIDVEN  --  3.3* 3.1*  --   --   --   --   --   --   INR 2.8*  --   --   --   --   --   --   --   --   BNP  --   --   --  234.3*  --  277.0* 320.4* 641.3* 676.9*  MG  --   --   --   --   --  1.8 2.1 2.0 2.0  CALCIUM 8.2*  --   --  7.9*  --  7.9* 8.0* 8.1* 8.3*    Total Time in preparing paper work, data evaluation and todays exam - 35 minutes  Signature  -    Susa Raring M.D on 12/23/2022 at 7:43 AM   -  To page go to [email or web address]

## 2022-12-23 NOTE — Plan of Care (Signed)
Problem: Education: Goal: Knowledge of General Education information will improve Description Including pain rating scale, medication(s)/side effects and non-pharmacologic comfort measures Outcome: Progressing   Problem: Clinical Measurements: Goal: Ability to maintain clinical measurements within normal limits will improve Outcome: Progressing Goal: Will remain free from infection Outcome: Progressing   Problem: Elimination: Goal: Will not experience complications related to urinary retention Outcome: Progressing   Problem: Pain Managment: Goal: General experience of comfort will improve Outcome: Progressing   Problem: Safety: Goal: Ability to remain free from injury will improve Outcome: Progressing   Problem: Skin Integrity: Goal: Risk for impaired skin integrity will decrease Outcome: Progressing

## 2022-12-23 NOTE — Discharge Instructions (Addendum)
Flush Foley catheter with the syringe provided to you upon discharge with sterile water every 8 hours.  Follow with Primary MD Thana Ates, MD in 7 days   Get CBC, CMP, 2 view Chest X ray -  checked next visit with your primary MD   Activity: As tolerated with Full fall precautions use walker/cane & assistance as needed  Disposition Home   Diet: Heart Healthy low carbohydrate, 1.5 L fluid restriction per day.  Check CBGs q. ACHS.  Special Instructions: If you have smoked or chewed Tobacco  in the last 2 yrs please stop smoking, stop any regular Alcohol  and or any Recreational drug use.  On your next visit with your primary care physician please Get Medicines reviewed and adjusted.  Please request your Prim.MD to go over all Hospital Tests and Procedure/Radiological results at the follow up, please get all Hospital records sent to your Prim MD by signing hospital release before you go home.  If you experience worsening of your admission symptoms, develop shortness of breath, life threatening emergency, suicidal or homicidal thoughts you must seek medical attention immediately by calling 911 or calling your MD immediately  if symptoms less severe.  You Must read complete instructions/literature along with all the possible adverse reactions/side effects for all the Medicines you take and that have been prescribed to you. Take any new Medicines after you have completely understood and accpet all the possible adverse reactions/side effects.

## 2022-12-23 NOTE — Progress Notes (Signed)
Patient complain of urgency but unable to urinate, has abdominal pain.Bladder scan show 689 ml. Made MD aware. Flush the foley's catheter and remove the blood clots. Meanwhile, patient urinate and post bladder scan showed (59 ml).

## 2022-12-25 LAB — PATHOLOGIST SMEAR REVIEW

## 2022-12-26 ENCOUNTER — Ambulatory Visit: Payer: Medicare HMO

## 2022-12-26 DIAGNOSIS — E1151 Type 2 diabetes mellitus with diabetic peripheral angiopathy without gangrene: Secondary | ICD-10-CM | POA: Diagnosis not present

## 2022-12-26 DIAGNOSIS — N390 Urinary tract infection, site not specified: Secondary | ICD-10-CM | POA: Diagnosis not present

## 2022-12-26 DIAGNOSIS — I480 Paroxysmal atrial fibrillation: Secondary | ICD-10-CM | POA: Diagnosis not present

## 2022-12-26 DIAGNOSIS — E1121 Type 2 diabetes mellitus with diabetic nephropathy: Secondary | ICD-10-CM | POA: Diagnosis not present

## 2022-12-26 DIAGNOSIS — T83511A Infection and inflammatory reaction due to indwelling urethral catheter, initial encounter: Secondary | ICD-10-CM | POA: Diagnosis not present

## 2022-12-26 DIAGNOSIS — C9110 Chronic lymphocytic leukemia of B-cell type not having achieved remission: Secondary | ICD-10-CM | POA: Diagnosis not present

## 2022-12-26 DIAGNOSIS — I2510 Atherosclerotic heart disease of native coronary artery without angina pectoris: Secondary | ICD-10-CM | POA: Diagnosis not present

## 2022-12-26 DIAGNOSIS — I119 Hypertensive heart disease without heart failure: Secondary | ICD-10-CM | POA: Diagnosis not present

## 2022-12-26 DIAGNOSIS — I495 Sick sinus syndrome: Secondary | ICD-10-CM | POA: Diagnosis not present

## 2022-12-27 DIAGNOSIS — E1151 Type 2 diabetes mellitus with diabetic peripheral angiopathy without gangrene: Secondary | ICD-10-CM | POA: Diagnosis not present

## 2022-12-27 DIAGNOSIS — I480 Paroxysmal atrial fibrillation: Secondary | ICD-10-CM | POA: Diagnosis not present

## 2022-12-27 DIAGNOSIS — I2510 Atherosclerotic heart disease of native coronary artery without angina pectoris: Secondary | ICD-10-CM | POA: Diagnosis not present

## 2022-12-27 DIAGNOSIS — N390 Urinary tract infection, site not specified: Secondary | ICD-10-CM | POA: Diagnosis not present

## 2022-12-27 DIAGNOSIS — I119 Hypertensive heart disease without heart failure: Secondary | ICD-10-CM | POA: Diagnosis not present

## 2022-12-27 DIAGNOSIS — E1121 Type 2 diabetes mellitus with diabetic nephropathy: Secondary | ICD-10-CM | POA: Diagnosis not present

## 2022-12-27 DIAGNOSIS — T83511A Infection and inflammatory reaction due to indwelling urethral catheter, initial encounter: Secondary | ICD-10-CM | POA: Diagnosis not present

## 2022-12-27 DIAGNOSIS — I495 Sick sinus syndrome: Secondary | ICD-10-CM | POA: Diagnosis not present

## 2022-12-27 DIAGNOSIS — C9110 Chronic lymphocytic leukemia of B-cell type not having achieved remission: Secondary | ICD-10-CM | POA: Diagnosis not present

## 2023-01-01 DIAGNOSIS — I480 Paroxysmal atrial fibrillation: Secondary | ICD-10-CM | POA: Diagnosis not present

## 2023-01-01 DIAGNOSIS — E1151 Type 2 diabetes mellitus with diabetic peripheral angiopathy without gangrene: Secondary | ICD-10-CM | POA: Diagnosis not present

## 2023-01-01 DIAGNOSIS — T83511A Infection and inflammatory reaction due to indwelling urethral catheter, initial encounter: Secondary | ICD-10-CM | POA: Diagnosis not present

## 2023-01-01 DIAGNOSIS — E1121 Type 2 diabetes mellitus with diabetic nephropathy: Secondary | ICD-10-CM | POA: Diagnosis not present

## 2023-01-01 DIAGNOSIS — I2510 Atherosclerotic heart disease of native coronary artery without angina pectoris: Secondary | ICD-10-CM | POA: Diagnosis not present

## 2023-01-01 DIAGNOSIS — N390 Urinary tract infection, site not specified: Secondary | ICD-10-CM | POA: Diagnosis not present

## 2023-01-01 DIAGNOSIS — C9110 Chronic lymphocytic leukemia of B-cell type not having achieved remission: Secondary | ICD-10-CM | POA: Diagnosis not present

## 2023-01-01 DIAGNOSIS — I495 Sick sinus syndrome: Secondary | ICD-10-CM | POA: Diagnosis not present

## 2023-01-01 DIAGNOSIS — I119 Hypertensive heart disease without heart failure: Secondary | ICD-10-CM | POA: Diagnosis not present

## 2023-01-02 ENCOUNTER — Other Ambulatory Visit: Payer: Self-pay

## 2023-01-02 ENCOUNTER — Telehealth: Payer: Self-pay | Admitting: Cardiovascular Disease

## 2023-01-02 ENCOUNTER — Telehealth: Payer: Self-pay | Admitting: *Deleted

## 2023-01-02 ENCOUNTER — Encounter (HOSPITAL_COMMUNITY): Payer: Self-pay

## 2023-01-02 ENCOUNTER — Emergency Department (HOSPITAL_COMMUNITY)
Admission: EM | Admit: 2023-01-02 | Discharge: 2023-01-02 | Disposition: A | Discharge status: Home / Self Care | Payer: Medicare HMO | Attending: Emergency Medicine | Admitting: Emergency Medicine

## 2023-01-02 DIAGNOSIS — D649 Anemia, unspecified: Secondary | ICD-10-CM | POA: Diagnosis not present

## 2023-01-02 DIAGNOSIS — I959 Hypotension, unspecified: Secondary | ICD-10-CM | POA: Diagnosis not present

## 2023-01-02 DIAGNOSIS — R55 Syncope and collapse: Secondary | ICD-10-CM | POA: Insufficient documentation

## 2023-01-02 DIAGNOSIS — R001 Bradycardia, unspecified: Secondary | ICD-10-CM | POA: Diagnosis not present

## 2023-01-02 DIAGNOSIS — R338 Other retention of urine: Secondary | ICD-10-CM | POA: Diagnosis not present

## 2023-01-02 DIAGNOSIS — Z960 Presence of urogenital implants: Secondary | ICD-10-CM | POA: Diagnosis not present

## 2023-01-02 DIAGNOSIS — N390 Urinary tract infection, site not specified: Secondary | ICD-10-CM | POA: Diagnosis not present

## 2023-01-02 DIAGNOSIS — D72829 Elevated white blood cell count, unspecified: Secondary | ICD-10-CM | POA: Insufficient documentation

## 2023-01-02 DIAGNOSIS — R531 Weakness: Secondary | ICD-10-CM | POA: Diagnosis not present

## 2023-01-02 DIAGNOSIS — R42 Dizziness and giddiness: Secondary | ICD-10-CM | POA: Diagnosis not present

## 2023-01-02 DIAGNOSIS — R21 Rash and other nonspecific skin eruption: Secondary | ICD-10-CM | POA: Diagnosis not present

## 2023-01-02 DIAGNOSIS — R310 Gross hematuria: Secondary | ICD-10-CM | POA: Diagnosis not present

## 2023-01-02 DIAGNOSIS — C9110 Chronic lymphocytic leukemia of B-cell type not having achieved remission: Secondary | ICD-10-CM | POA: Insufficient documentation

## 2023-01-02 DIAGNOSIS — R231 Pallor: Secondary | ICD-10-CM | POA: Diagnosis not present

## 2023-01-02 DIAGNOSIS — Z91010 Allergy to peanuts: Secondary | ICD-10-CM | POA: Insufficient documentation

## 2023-01-02 LAB — COMPREHENSIVE METABOLIC PANEL
ALT: 14 U/L (ref 0–44)
AST: 18 U/L (ref 15–41)
Albumin: 3.3 g/dL — ABNORMAL LOW (ref 3.5–5.0)
Alkaline Phosphatase: 51 U/L (ref 38–126)
Anion gap: 8 (ref 5–15)
BUN: 24 mg/dL — ABNORMAL HIGH (ref 8–23)
CO2: 21 mmol/L — ABNORMAL LOW (ref 22–32)
Calcium: 8.5 mg/dL — ABNORMAL LOW (ref 8.9–10.3)
Chloride: 108 mmol/L (ref 98–111)
Creatinine, Ser: 0.93 mg/dL (ref 0.61–1.24)
GFR, Estimated: >60 mL/min (ref >60)
Glucose, Bld: 115 mg/dL — ABNORMAL HIGH (ref 70–99)
Potassium: 4.5 mmol/L (ref 3.5–5.1)
Sodium: 137 mmol/L (ref 135–145)
Total Bilirubin: 0.7 mg/dL (ref 0.3–1.2)
Total Protein: 5.7 g/dL — ABNORMAL LOW (ref 6.5–8.1)

## 2023-01-02 LAB — CBC WITH DIFFERENTIAL/PLATELET
Abs Immature Granulocytes: 0.04 10*3/uL (ref 0.00–0.07)
Basophils Absolute: 0 10*3/uL (ref 0.0–0.1)
Basophils Relative: 0 %
Eosinophils Absolute: 0 10*3/uL (ref 0.0–0.5)
Eosinophils Relative: 0 %
HCT: 34.6 % — ABNORMAL LOW (ref 39.0–52.0)
Hemoglobin: 10.4 g/dL — ABNORMAL LOW (ref 13.0–17.0)
Immature Granulocytes: 0 %
Lymphocytes Relative: 57 %
Lymphs Abs: 7.8 10*3/uL — ABNORMAL HIGH (ref 0.7–4.0)
MCH: 26.1 pg (ref 26.0–34.0)
MCHC: 30.1 g/dL (ref 30.0–36.0)
MCV: 86.9 fL (ref 80.0–100.0)
Monocytes Absolute: 0.4 10*3/uL (ref 0.1–1.0)
Monocytes Relative: 3 %
Neutro Abs: 5.4 10*3/uL (ref 1.7–7.7)
Neutrophils Relative %: 40 %
Platelets: 159 10*3/uL (ref 150–400)
RBC: 3.98 MIL/uL — ABNORMAL LOW (ref 4.22–5.81)
RDW: 23.5 % — ABNORMAL HIGH (ref 11.5–15.5)
WBC Morphology: ABNORMAL
WBC: 13.6 10*3/uL — ABNORMAL HIGH (ref 4.0–10.5)
nRBC: 0 % (ref 0.0–0.2)

## 2023-01-02 LAB — URINALYSIS, ROUTINE W REFLEX MICROSCOPIC
Bilirubin Urine: NEGATIVE
Glucose, UA: >=500 mg/dL — AB
Ketones, ur: NEGATIVE mg/dL
Nitrite: NEGATIVE
Protein, ur: NEGATIVE mg/dL
RBC / HPF: >50 RBC/hpf (ref 0–5)
Specific Gravity, Urine: 1.01 (ref 1.005–1.030)
pH: 5 (ref 5.0–8.0)

## 2023-01-02 LAB — BRAIN NATRIURETIC PEPTIDE: B Natriuretic Peptide: 386.7 pg/mL — ABNORMAL HIGH (ref 0.0–100.0)

## 2023-01-02 LAB — IRON AND TIBC
Iron: 49 ug/dL (ref 45–182)
Saturation Ratios: 17 % — ABNORMAL LOW (ref 17.9–39.5)
TIBC: 283 ug/dL (ref 250–450)
UIBC: 234 ug/dL

## 2023-01-02 LAB — FERRITIN: Ferritin: 105 ng/mL (ref 24–336)

## 2023-01-02 NOTE — Telephone Encounter (Signed)
I do not need this encounter 

## 2023-01-02 NOTE — Discharge Instructions (Addendum)
Today's evaluation has demonstrated the persistency of your anemia as well as evidence for CLL.  Your findings are otherwise generally reassuring.  Please monitor your condition carefully and be sure to follow-up with both your oncology and cardiology teams.  Cardiology should call you today or tomorrow for follow-up within this week.

## 2023-01-02 NOTE — ED Provider Notes (Signed)
[facility] AT [facility] Provider Note   CSN: [id number] Arrival date & time: 01/02/23  1053     History  Chief Complaint  Patient presents with   Hypotension   Near Syncope    [facility] is a 82 y.o. male.  HPI Patient presents with his son who assists with the history. Patient has history notable for recent hospitalization for MRI, hematuria, has a indwelling Foley catheter in place.  Today he was going to a scheduled urology follow-up visit when he felt lightheaded and route.  No fall, no syncope.  Per report patient was hypotensive on arrival to the urology clinic, systolic 70.  Patient received fluids,feels somewhat better currently, has no focal pain.    Home Medications Prior to Admission medications   Medication Sig Start Date End Date Taking? Authorizing Provider  atorvastatin (LIPITOR) 80 MG tablet Take 1 tablet (80 mg total) by mouth daily at 6 PM. 02/09/18   Janetta Hora, PA-C  Cholecalciferol (VITAMIN D) 50 MCG (2000 UT) CAPS Take 1 capsule by mouth 2 (two) times daily.    [provider]  empagliflozin (JARDIANCE) 10 MG TABS tablet Take 1 tablet (10 mg total) by mouth daily. 12/03/22 01/02/23  Hughie Closs, MD  finasteride (PROSCAR) 5 MG tablet Take 5 mg by mouth daily.    [provider]  glipiZIDE (GLUCOTROL) 5 MG tablet Take 5 mg by mouth daily with breakfast.  01/10/18   [provider]  HYDROcodone-acetaminophen (NORCO/VICODIN) 5-325 MG tablet Take 1 tablet by mouth every 6 (six) hours as needed for moderate pain.    [provider]  isosorbide mononitrate (IMDUR) 60 MG 24 hr tablet Take 1.5 tablets (90 mg total) by mouth daily. 11/21/22 02/19/23  Jodelle Gross, NP  melatonin 5 MG TABS Take 5 mg by mouth at bedtime.    [provider]  metFORMIN (GLUCOPHAGE) 1000 MG tablet Take 1 tablet (1,000 mg total) by mouth 2 (two) times daily with a meal. Restart on 3/18. 06/23/20    [facility], Roe Rutherford, PA  metoprolol tartrate (LOPRESSOR) 100 MG tablet Take 1 tablet (100 mg total) by mouth 2 (two) times daily. 08/07/17   Jodelle Gross, NP  MULTIPLE VITAMIN-FOLIC ACID PO Take 1 tablet by mouth daily.    [provider]  nitroGLYCERIN (NITROSTAT) 0.4 MG SL tablet Place 1 tablet (0.4 mg total) under the tongue every 5 (five) minutes as needed for chest pain. 11/21/22   Jodelle Gross, NP  ranolazine (RANEXA) 500 MG 12 hr tablet Take 500 mg by mouth 2 (two) times daily.    [provider]  rivaroxaban (XARELTO) 20 MG TABS tablet Take 20 mg by mouth daily with supper.    [provider]  tamsulosin (FLOMAX) 0.4 MG CAPS capsule Take 0.4 mg by mouth 2 (two) times daily.  03/09/15   [provider]      Allergies    Peanut-containing drug products, Ace inhibitors, Diltiazem hcl, Meloxicam, Doxycycline, Eliquis [apixaban], Sertraline hcl, Carvedilol, Clonidine hcl, and Duloxetine hcl    Review of Systems   Review of Systems  All other systems reviewed and are negative.   Physical Exam Updated Vital Signs BP 133/63 (BP Location: Right Arm)   Pulse 71   Temp (!) 97.4 F (36.3 C) (Oral)   Resp 15   Ht 5\' 7"  (1.702 m)   Wt 72.6 kg   SpO2 98%   BMI 25.06 kg/m  Physical  Exam Vitals and nursing note reviewed.  Constitutional:      General: He is not in acute distress.    Appearance: He is well-developed.  HENT:     Head: Normocephalic and atraumatic.  Eyes:     Conjunctiva/sclera: Conjunctivae normal.  Cardiovascular:     Rate and Rhythm: Normal rate and regular rhythm.  Pulmonary:     Effort: Pulmonary effort is normal. No respiratory distress.     Breath sounds: No stridor.  Abdominal:     General: There is no distension.     Tenderness: There is no abdominal tenderness. There is no guarding.  Genitourinary:    Comments: Inguinal rash consistent with yeast Skin:    General: Skin is warm and dry.     Coloration:  Skin is pale.  Neurological:     Mental Status: He is alert and oriented to person, place, and time.     ED Results / Procedures / Treatments   Labs (all labs ordered are listed, but only abnormal results are displayed) Labs Reviewed  BRAIN NATRIURETIC PEPTIDE - Abnormal; Notable for the following components:      Result Value   B Natriuretic Peptide 386.7 (*)    All other components within normal limits  COMPREHENSIVE METABOLIC PANEL - Abnormal; Notable for the following components:   CO2 21 (*)    Glucose, Bld 115 (*)    BUN 24 (*)    Calcium 8.5 (*)    Total Protein 5.7 (*)    Albumin 3.3 (*)    All other components within normal limits  CBC WITH DIFFERENTIAL/PLATELET - Abnormal; Notable for the following components:   WBC 13.6 (*)    RBC 3.98 (*)    Hemoglobin 10.4 (*)    HCT 34.6 (*)    RDW 23.5 (*)    Lymphs Abs 7.8 (*)    All other components within normal limits  URINALYSIS, ROUTINE W REFLEX MICROSCOPIC - Abnormal; Notable for the following components:   APPearance HAZY (*)    Glucose, UA >=500 (*)    Hgb urine dipstick LARGE (*)    Leukocytes,Ua MODERATE (*)    Bacteria, UA RARE (*)    All other components within normal limits    EKG EKG Interpretation Date/Time:  Tuesday January 02 2023 11:38:18 EDT Ventricular Rate:  61 PR Interval:    QRS Duration:  126 QT Interval:  428 QTC Calculation: 432 R Axis:   92  Text Interpretation: ATRIAL PACED RHYTHM Confirmed by Gerhard Munch [phone]) on 01/02/2023 12:09:48 PM  Radiology No results found.  Procedures Procedures    Medications Ordered in ED Medications - No data to display  ED Course/ Medical Decision Making/ A&P                                 Medical Decision Making Adult male presents after a sewed of lightheadedness with context of recent hospitalization, ongoing concern for indwelling Foley catheter and episodes of weakness that occur throughout the day.  Patient is currently awake and  alert, mildly hypotensive but not tachycardic, suspicion for hypovolemia versus anemia, less likely infection or arrhythmia. Cardiac 75 sinus normal Pulse ox 99% room air normal   Amount and/or Complexity of Data Reviewed Independent Historian: caregiver    Details: Son at bedside External Data Reviewed: notes.    Details: Recent hospitalization Labs: ordered. Decision-making details documented in ED Course. Radiology: ordered and  independent interpretation performed. Decision-making details documented in ED Course. ECG/medicine tests: ordered and independent interpretation performed. Decision-making details documented in ED Course.  Risk Prescription drug management. Decision regarding hospitalization. Diagnosis or treatment significantly limited by social determinants of health.   2:18 PM Repeat exam the patient is awake, patient's face color has improved, he notes that he feels better, son states that he appears better as well.  Discussed and reviewed all lab findings, notable for evidence for CLL, as well as persistent anemia.  Some suspicion for the patient's anemia, CLL contributing to his episodic lightheadedness.  No evidence for infection, abnormal UA consistent with prior, slightly better, mild leukocytosis noted.  Patient is afebrile, not hypotensive, some suspicion for hypovolemia versus medication effect versus anemia contributing to his presentation.  With his history of heart failure, CLL he will follow-up with both oncology and cardiology.        Final Clinical Impression(s) / ED Diagnoses Final diagnoses:  Near syncope  Anemia, unspecified type    Rx / DC Orders ED Discharge Orders          Ordered    Ambulatory referral to Cardiology       Comments: If you have not heard from the Cardiology office within the next 72 hours please call [phone].   01/02/23 1418              Gerhard Munch, MD 01/02/23 1419

## 2023-01-02 NOTE — Telephone Encounter (Signed)
Daughter states patient went to the ED today due to BP issues, is being released today. Is requesting to have Iron levels checked sooner than scheduled.   Message to scheduler to get in for labs this week

## 2023-01-02 NOTE — ED Triage Notes (Signed)
Patient was over at Urology and had a near syncopal episode with hypotension that was 70/40. Pt given NS and blood pressure has come up some. Pt has history of CHF.

## 2023-01-03 DIAGNOSIS — I119 Hypertensive heart disease without heart failure: Secondary | ICD-10-CM | POA: Diagnosis not present

## 2023-01-03 DIAGNOSIS — C9110 Chronic lymphocytic leukemia of B-cell type not having achieved remission: Secondary | ICD-10-CM | POA: Diagnosis not present

## 2023-01-03 DIAGNOSIS — I480 Paroxysmal atrial fibrillation: Secondary | ICD-10-CM | POA: Diagnosis not present

## 2023-01-03 DIAGNOSIS — E1121 Type 2 diabetes mellitus with diabetic nephropathy: Secondary | ICD-10-CM | POA: Diagnosis not present

## 2023-01-03 DIAGNOSIS — T83511A Infection and inflammatory reaction due to indwelling urethral catheter, initial encounter: Secondary | ICD-10-CM | POA: Diagnosis not present

## 2023-01-03 DIAGNOSIS — N390 Urinary tract infection, site not specified: Secondary | ICD-10-CM | POA: Diagnosis not present

## 2023-01-03 DIAGNOSIS — I495 Sick sinus syndrome: Secondary | ICD-10-CM | POA: Diagnosis not present

## 2023-01-03 DIAGNOSIS — E1151 Type 2 diabetes mellitus with diabetic peripheral angiopathy without gangrene: Secondary | ICD-10-CM | POA: Diagnosis not present

## 2023-01-03 DIAGNOSIS — I2510 Atherosclerotic heart disease of native coronary artery without angina pectoris: Secondary | ICD-10-CM | POA: Diagnosis not present

## 2023-01-04 DIAGNOSIS — I10 Essential (primary) hypertension: Secondary | ICD-10-CM | POA: Diagnosis not present

## 2023-01-04 DIAGNOSIS — C9110 Chronic lymphocytic leukemia of B-cell type not having achieved remission: Secondary | ICD-10-CM | POA: Diagnosis not present

## 2023-01-04 DIAGNOSIS — E1142 Type 2 diabetes mellitus with diabetic polyneuropathy: Secondary | ICD-10-CM | POA: Diagnosis not present

## 2023-01-04 DIAGNOSIS — I25709 Atherosclerosis of coronary artery bypass graft(s), unspecified, with unspecified angina pectoris: Secondary | ICD-10-CM | POA: Diagnosis not present

## 2023-01-04 DIAGNOSIS — I480 Paroxysmal atrial fibrillation: Secondary | ICD-10-CM | POA: Diagnosis not present

## 2023-01-04 NOTE — Progress Notes (Signed)
Cardiology Clinic Note   Patient Name: Robert Burns Date of Encounter: 01/05/2023  Primary Care Provider:  Thana Ates, MD Primary Cardiologist:  Nanetta Batty, MD  Patient Profile    82 year old male with hx of CAD, s/p CABG, most recent cath 12/01/2022 with occluded SVG to RCA, but not salvageable, therefore not amenable to intervention. LIMA patent to LAD, known prior occlusion to LCX graft and some in stent restenosis in SVG to diagonal. Medical Rx only. He also has PAF and SSS S/P dual chamber PPM.   He has multiple medical problems to include CLL, anemia, BPH, PAD, and 3 hospitalizations since 12/01/2022.  He was admitted through [facility] on 12/07/2022 with urinary retention, urinary catheter was placed, this resulted in subsequent hematuria, blood loss anemia, requiring readmission again 2 days later for repair.  Rehospitalization from 12/18/2022-12/23/2022 with sepsis and urinary retention and syncopal episode. He was anemic and received 2 units of PRBC/s. Xarelto was restarted on discharge.   Multiple medication adjustments have been made during the 3 admissions with discontinuation and adjustments concerning antihypertensives.  Metoprolol has been decreased from 100 mg twice daily to 50 mg twice daily.  Melatonin has been discontinued.  Plavix was discontinued, Xarelto was held for several days and restarted resumed on 12/22/2022.  Urology has been following him and he is on 2 alpha blockers and is to be followed by them closely.  Multiple lab draws have been made.  He also is being treated for diabetes post hospitalization and now is wearing a libre monitor.  Past Medical History    Past Medical History:  Diagnosis Date   Anemia    Anxiety    Arthritis    "all over" (11/25/2015)   CAD (coronary artery disease)    a. s/p CABG  06/2002; b. 02/01/15 PCI: DES to prox SVG to PDA, staged PCI of SVG to Diag in 02/2015; c. 04/2017 Cath/PCI: LM nl, LAD 100ost, 1m, 75d, LCX 60ost,  OM2 80, RCA 100ost, RPDA 80, LIMA->LAD ok, VG->D1 patent stent, VG->RPDA patent stent, 50p, VG->OM1->OM2 90p (3.0x24 Synergy DES), 100 between OM1->OM2 (med rx).   Cancer [facility])    Right Shoulder, Left Leg- BCC, SCC, AND MELANOMA   Colon polyp    Elevated cholesterol    Epithelioid hemangioendothelioma    s/p resection of left SFA/mass with interposition 6 mm GoreTex graft 06/02/10, s/p repeat resection for positive margins 09/22/10   High blood pressure    Hyperlipidemia    Hypertension    Leg pain    OSA on CPAP    PAD (peripheral artery disease) (HCC)    a. 10/2002 L SFA PTA/BMS; b. 8/17 LE Angio: LEIA 90 (9x40 self exp stent), LSFA short segment prox occlusion (staged PTA/stenting 01/03/2016), patent mid stent, RSFA 21m (staged PTA/DEB 02/14/2016).   Presence of permanent cardiac pacemaker    Medtronic   Renal insufficiency 12/18/2022   SSS (sick sinus syndrome) (HCC)    a. s/p PPM in 2007 with gen change 04/2015 - Medtronic Adapta ADDRL1, ser # [id number] H.   Type II diabetes mellitus (HCC)    Type II   Urgency of urination    Past Surgical History:  Procedure Laterality Date   CARDIAC CATHETERIZATION N/A 02/01/2015   Procedure: Left Heart Cath and Coronary Angiography;  Surgeon: Runell Gess, MD;  Location: [facility] INVASIVE CV LAB;  Service: Cardiovascular;  Laterality: N/A;   CARDIAC CATHETERIZATION N/A 02/01/2015   Procedure: Coronary Stent Intervention;  Surgeon: Christiane Ha  Erlene Quan, MD;  Location: [facility] INVASIVE CV LAB;  Service: Cardiovascular;  Laterality: N/A;   CARDIAC CATHETERIZATION  06/2002   "just before bypass OR"   CARDIAC CATHETERIZATION N/A 03/01/2015   Procedure: Coronary Stent Intervention;  Surgeon: Runell Gess, MD;  Location: [facility] INVASIVE CV LAB;  Service: Cardiovascular;  Laterality: N/A;   CARDIAC CATHETERIZATION  02/06/2018   COLONOSCOPY     CORONARY ANGIOPLASTY     CORONARY ARTERY BYPASS GRAFT  06/2002   x5, LIMA-LAD;VG- Diag; seq VG- ramus & OM branch; VG-PDA    CORONARY STENT INTERVENTION N/A 04/12/2017   Procedure: CORONARY STENT INTERVENTION;  Surgeon: Runell Gess, MD;  Location: [facility] INVASIVE CV LAB;  Service: Cardiovascular;  Laterality: N/A;   CORONARY STENT INTERVENTION N/A 02/08/2018   Procedure: CORONARY STENT INTERVENTION;  Surgeon: Corky Crafts, MD;  Location: [facility] INVASIVE CV LAB;  Service: Cardiovascular;  Laterality: N/A;   CORONARY STENT INTERVENTION N/A 11/12/2018   Procedure: CORONARY STENT INTERVENTION;  Surgeon: Iran Ouch, MD;  Location: [facility] INVASIVE CV LAB;  Service: Cardiovascular;  Laterality: N/A;   CORONARY STENT INTERVENTION N/A 06/22/2020   Procedure: CORONARY STENT INTERVENTION;  Surgeon: Corky Crafts, MD;  Location[facility] INVASIVE CV LAB;  Service: Cardiovascular;  Laterality: N/A;   EP IMPLANTABLE DEVICE N/A 04/23/2015   Procedure: PPM Generator Changeout;  Surgeon: Thurmon Fair, MD;  Location: [facility] INVASIVE CV LAB;  Service: Cardiovascular;  Laterality: N/A;   FALSE ANEURYSM REPAIR Left 11/29/2018   Procedure: REPAIR FALSE ANEURYSM LEFT RADIAL ARTERY;  Surgeon: Larina Earthly, MD;  Location: [facility] OR;  Service: Vascular;  Laterality: Left;   FEMORAL ARTERY STENT Left ~ 2014   "taken out of my leg; couldn' catorgorize what kind so the put it under all 3"; cataroziepitheloid hemanioendotheliomau   FOOT FRACTURE SURGERY Left 1973   FRACTURE SURGERY     INSERT / REPLACE / REMOVE PACEMAKER  10/13/05   right side, medtronic Adapta   KNEE HARDWARE REMOVAL Right 1950's   "3-4 months after the insertion"   KNEE SURGERY Right 1950's   "broke my lower leg; had to put pin in my knee to keep lower leg in place til it healed"   LEFT HEART CATH AND CORS/GRAFTS ANGIOGRAPHY N/A 04/12/2017   Procedure: LEFT HEART CATH AND CORS/GRAFTS ANGIOGRAPHY;  Surgeon: Runell Gess, MD;  Location: [facility] INVASIVE CV LAB;  Service: Cardiovascular;  Laterality: N/A;   LEFT HEART CATH AND CORS/GRAFTS ANGIOGRAPHY N/A 02/06/2018   Procedure: LEFT  HEART CATH AND CORS/GRAFTS ANGIOGRAPHY;  Surgeon: Lennette Bihari, MD;  Location: [facility] INVASIVE CV LAB;  Service: Cardiovascular;  Laterality: N/A;   LEFT HEART CATH AND CORS/GRAFTS ANGIOGRAPHY N/A 11/12/2018   Procedure: LEFT HEART CATH AND CORS/GRAFTS ANGIOGRAPHY;  Surgeon: Iran Ouch, MD;  Location: [facility] INVASIVE CV LAB;  Service: Cardiovascular;  Laterality: N/A;   LEFT HEART CATH AND CORS/GRAFTS ANGIOGRAPHY N/A 06/22/2020   Procedure: LEFT HEART CATH AND CORS/GRAFTS ANGIOGRAPHY;  Surgeon: Corky Crafts, MD;  Location: [facility] INVASIVE CV LAB;  Service: Cardiovascular;  Laterality: N/A;   LEFT HEART CATH AND CORS/GRAFTS ANGIOGRAPHY N/A 12/01/2022   Procedure: LEFT HEART CATH AND CORS/GRAFTS ANGIOGRAPHY;  Surgeon: Swazil[place], Peter M, MD;  Location: [facility] INVASIVE CV LAB;  Service: Cardiovascular;  Laterality: N/A;   PERIPHERAL VASCULAR CATHETERIZATION N/A 11/25/2015   Procedure: Lower Extremity Angiography;  Surgeon: Runell Gess, MD;  Location: [facility] INVASIVE CV LAB;  Service: Cardiovascular;  Laterality: N/A;   PERIPHERAL  VASCULAR CATHETERIZATION Left 11/25/2015   Procedure: Peripheral Vascular Intervention;  Surgeon: Runell Gess, MD;  Location: [facility] INVASIVE CV LAB;  Service: Cardiovascular;  Laterality: Left;  external iliac   PERIPHERAL VASCULAR CATHETERIZATION N/A 01/03/2016   Procedure: Lower Extremity Angiography;  Surgeon: Runell Gess, MD;  Location: [facility] INVASIVE CV LAB;  Service: Cardiovascular;  Laterality: N/A;   PERIPHERAL VASCULAR CATHETERIZATION Left 01/03/2016   Procedure: Peripheral Vascular Intervention;  Surgeon: Runell Gess, MD;  Location: [facility] INVASIVE CV LAB;  Service: Cardiovascular;  Laterality: Left;  SFA   PERIPHERAL VASCULAR CATHETERIZATION Right 02/14/2016   Procedure: Peripheral Vascular Atherectomy;  Surgeon: Runell Gess, MD;  Location: [facility] INVASIVE CV LAB;  Service: Cardiovascular;  Laterality: Right;  SFA   POPLITEAL ARTERY STENT  01/03/2016   Contralateral  access with a 7 [name] crossover sheath (second order catheter placement)   TONSILLECTOMY AND ADENOIDECTOMY     TUMOR EXCISION Right ~ 2005   cancerous tumor removed from shoulder   TUMOR EXCISION Right ~ 2000   benign tumor removed from under shoulder   TUMOR EXCISION Left 06/02/2010   resection of Lt SFA wth interposition of Gore-Tex graft    Allergies  Allergies  Allergen Reactions   Peanut-Containing Drug Products Anaphylaxis and Other (See Comments)    Tongue swelling is severe    Ace Inhibitors Cough        Diltiazem Hcl Other (See Comments)    UNSPECIFIED REACTION     Meloxicam Other (See Comments)    GI upset    Doxycycline Other (See Comments) and Cough    Reaction of cough and runny nose   Eliquis [Apixaban] Other (See Comments)    dizziness   Sertraline Hcl Other (See Comments)   Carvedilol Itching   Clonidine Hcl Other (See Comments)    Patch only - skin irritation   Duloxetine Hcl Other (See Comments)    Urinary frequency    History of Present Illness    Mr. Chopin comes today posthospitalization follow-up last of 3, most recent discharge 12/23/2022.  Majority of his issues have been related to urinary retention, urinary sepsis, blood loss anemia in the setting of Foley catheter dislodgment and injury.  He is slowly returning back to his baseline but continues to have some memory loss.  He is family members who are with him are stating that he is feeling some better and memory is returning.    He continues to have generalized fatigue and weakness.  He is working with physical therapy and Occupational Therapy.  They are carefully monitoring his blood pressure.  Blood pressures have gotten as low as 94/56, and up as high as 140/80.  After taking medications blood pressures have been running around to 100/50 to 115/54.  Today's blood pressure is 112/54.  He continues to have some mild lightheadedness although this is not consistent.  He continues to be very frail.   He has had no further evidence of bleeding from Foley catheter, bowels, or hemoptysis or epistaxis with returning to Xarelto.  Family members have multiple questions concerning his status and progress.   Home Medications    Current Outpatient Medications  Medication Sig Dispense Refill   atorvastatin (LIPITOR) 80 MG tablet Take 1 tablet (80 mg total) by mouth daily at 6 PM. 90 tablet 1   Cholecalciferol (VITAMIN D) 50 MCG (2000 UT) CAPS Take 1 capsule by mouth 2 (two) times daily.     finasteride (PROSCAR) 5 MG tablet Take  5 mg by mouth daily.     glipiZIDE (GLUCOTROL) 5 MG tablet Take 5 mg by mouth daily with breakfast.      HYDROcodone-acetaminophen (NORCO/VICODIN) 5-325 MG tablet Take 1 tablet by mouth every 6 (six) hours as needed for moderate pain.     isosorbide mononitrate (IMDUR) 60 MG 24 hr tablet Take 60 mg by mouth daily.     melatonin 5 MG TABS Take 5 mg by mouth at bedtime.     metFORMIN (GLUCOPHAGE) 1000 MG tablet Take 1 tablet (1,000 mg total) by mouth 2 (two) times daily with a meal. Restart on 3/18.     metoprolol tartrate (LOPRESSOR) 50 MG tablet Take 50 mg by mouth 2 (two) times daily.     MULTIPLE VITAMIN-FOLIC ACID PO Take 1 tablet by mouth daily.     nitroGLYCERIN (NITROSTAT) 0.4 MG SL tablet Place 1 tablet (0.4 mg total) under the tongue every 5 (five) minutes as needed for chest pain. 25 tablet 3   ranolazine (RANEXA) 500 MG 12 hr tablet Take 500 mg by mouth 2 (two) times daily.     rivaroxaban (XARELTO) 20 MG TABS tablet Take 20 mg by mouth daily with supper.     tamsulosin (FLOMAX) 0.4 MG CAPS capsule Take 0.4 mg by mouth 2 (two) times daily.      No current facility-administered medications for this visit.     Family History    Family History  Problem Relation Age of Onset   Coronary artery disease Mother    Heart attack Mother    Hypertension Mother    Heart disease Mother        Open  Heart surgery   Diabetes Father    Heart disease Father     Hyperlipidemia Father    Heart attack Father    Hypertension Sister    Diabetes Sister    Heart disease Sister    Liver disease Neg Hx    Esophageal cancer Neg Hx    Colon cancer Neg Hx    He indicated that his mother is deceased. He indicated that his father is deceased. He indicated that his sister is alive. He indicated that his maternal grandmother is deceased. He indicated that his maternal grandfather is deceased. He indicated that his paternal grandmother is deceased. He indicated that his paternal grandfather is deceased. He indicated that the status of his neg hx is unknown.  Social History    Social History   Socioeconomic History   Marital status: Married    Spouse name: Not on file   Number of children: 2   Years of education: Not on file   Highest education level: Not on file  Occupational History   Occupation: retired  Tobacco Use   Smoking status: Former    Types: Pipe    Quit date: 1973    Years since quitting: 51.7   Smokeless tobacco: Never   Tobacco comments:    "quit smoking in 1973"  Vaping Use   Vaping status: Never Used  Substance and Sexual Activity   Alcohol use: No   Drug use: No   Sexual activity: Not Currently  Other Topics Concern   Not on file  Social History Narrative   Not on file   Social Determinants of Health   Financial Resource Strain: Not on file  Food Insecurity: No Food Insecurity (12/18/2022)   Hunger Vital Sign    Worried About Running Out of Food in the Last Year: Never true  Ran Out of Food in the Last Year: Never true  Transportation Needs: No Transportation Needs (12/18/2022)   PRAPARE - Administrator, Civil Service (Medical): No    Lack of Transportation (Non-Medical): No  Physical Activity: Not on file  Stress: No Stress Concern Present (12/07/2022)   Received from [facility] - Occupational Stress Questionnaire    Feeling of Stress : Not at all  Social  Connections: Unknown (08/18/2021)   Received from [facility], [facility]   Social Network    Social Network: Not on file  Intimate Partner Violence: Not At Risk (12/07/2022)   Received from [facility]   HITS    Over the last 12 months how often did your partner physically hurt you?: 1    Over the last 12 months how often did your partner insult you or talk down to you?: 1    Over the last 12 months how often did your partner threaten you with physical harm?: 1    Over the last 12 months how often did your partner scream or curse at you?: 1     Review of Systems    General:  No chills, fever, night sweats or weight changes.  Complains of hoarseness in his throat and trouble swallowing sometimes.  Generalized fatigue. Cardiovascular:  No chest pain, dyspnea on exertion, edema, orthopnea, palpitations, paroxysmal nocturnal dyspnea. Dermatological: No rash, lesions/masses Respiratory: No cough, dyspnea Urologic: No hematuria, dysuria Abdominal:   No nausea, vomiting, diarrhea, bright red blood per rectum, melena, or hematemesis Neurologic:  No visual changes, wkns, changes in mental status. All other systems reviewed and are otherwise negative except as noted above.       Physical Exam    VS:  BP (!) 112/54 (BP Location: Left Arm, Patient Position: Sitting, Cuff Size: Normal)   Pulse 95   Ht 5\' 7"  (1.702 m)   Wt 158 lb (71.7 kg)   SpO2 98%   BMI 24.75 kg/m  , BMI Body mass index is 24.75 kg/m.     GEN: Well nourished, well developed, in no acute distress.  Frail  HEENT: normal. Neck: Supple, no JVD, carotid bruits, or masses. Cardiac: RRR, no murmurs, rubs, or gallops. No clubbing, cyanosis, edema.  Radials/DP/PT 2+ and equal bilaterally.  Respiratory:  Respirations regular and unlabored, clear to auscultation bilaterally. GI: Soft, nontender, nondistended, BS + x 4. MS: no deformity or atrophy.  Uses cane for ambulation, continues unsteady on his feet. Skin: warm and  dry, no rash.  Pale Neuro:  Strength and sensation are intact. Psych: Normal affect.    Lab Results  Component Value Date   WBC 13.6 (H) 01/02/2023   HGB 10.4 (L) 01/02/2023   HCT 34.6 (L) 01/02/2023   MCV 86.9 01/02/2023   PLT 159 01/02/2023   Lab Results  Component Value Date   CREATININE 0.93 01/02/2023   BUN 24 (H) 01/02/2023   NA 137 01/02/2023   K 4.5 01/02/2023   CL 108 01/02/2023   CO2 21 (L) 01/02/2023   Lab Results  Component Value Date   ALT 14 01/02/2023   AST 18 01/02/2023   ALKPHOS 51 01/02/2023   BILITOT 0.7 01/02/2023   Lab Results  Component Value Date   CHOL 61 12/01/2022   HDL 29 (L) 12/01/2022   LDLCALC 23 12/01/2022   TRIG 45 12/01/2022   CHOLHDL 2.1 12/01/2022    Lab Results  Component Value Date  HGBA1C 6.6 (H) 11/30/2022     Review of Prior Studies    [facility] 12/01/2022.  Mid LM lesion is 50% stenosed.   Ost LAD to Prox LAD lesion is 100% stenosed.   Mid LAD-2 lesion is 20% stenosed.   Dist LAD lesion is 25% stenosed.   Ost Cx lesion is 40% stenosed.   Prox Cx lesion is 30% stenosed.   Mid Cx lesion is 75% stenosed.   Ost RCA to Dist RCA lesion is 100% stenosed.   Ost Ramus lesion is 95% stenosed.   Prox Graft to Dist Graft lesion between Ramus and 2nd Mrg  is 100% stenosed.   Origin to Mid Graft lesion before Prox LAD  is 100% stenosed.   Origin to Prox Graft lesion is 100% stenosed.   Ost 3rd Mrg to 3rd Mrg lesion is 99% stenosed.   Prox Graft lesion is 70% stenosed.   Non-stenotic Mid LAD-1 lesion.   Non-stenotic Mid Graft to Dist Graft lesion was previously treated.   Non-stenotic Mid Graft lesion was previously treated.   Non-stenotic Mid Graft to Dist Graft lesion was previously treated.   LV end diastolic pressure is moderately elevated.   Severe 3 vessel obstructive CAD Patent LIMA to the LAD Known occlusion of SVG to LCx Patent SVG to diagonal. Multiple prior stents. Within the proximal stent there is a focal 70%  stenosis. Other stented segments look good.  Occluded SVG to RCA. This is new. There are some left to right collaterals to distal RCA Moderately elevated LVEDP 27 mm Hg   Plan: SVG to RCA cannot be salvaged. This appears to be the culprit. Recommend continued medical therapy. If refractory angina could consider balloon angioplasty of in stent disease in SVG to diagonal but long term patency would be limited.    Echocardiogram 11/30/2022 1. Left ventricular ejection fraction, by estimation, is 45 to 50%. The  left ventricle has mildly decreased function. The left ventricle  demonstrates global hypokinesis. The left ventricular internal cavity size  was mildly to moderately dilated. Left  ventricular diastolic parameters are consistent with Grade II diastolic  dysfunction (pseudonormalization).   2. Right ventricular systolic function is normal. The right ventricular  size is normal. There is normal pulmonary artery systolic pressure.   3. Left atrial size was moderately dilated.   4. Right atrial size was moderately dilated.   5. The mitral valve is normal in structure. Trivial mitral valve  regurgitation. No evidence of mitral stenosis.   6. The aortic valve is tricuspid. There is mild calcification of the  aortic valve. There is mild thickening of the aortic valve. Aortic valve  regurgitation is mild.   7. The inferior vena cava is normal in size with greater than 50%  respiratory variability, suggesting right atrial pressure of 3 mmHg.     Assessment & Plan   1.  Coronary artery disease: Status post cardiac catheterization on 12/01/2022.  Reveals severe three-vessel obstructive CAD with patent LIMA to LAD, known occlusion of SVG to left circumflex and patent SVG to diagonal with multiple prior stents.  He was found to have a new occluded SVG to RCA with some left-to-right collaterals to the distal RCA.  As above SVG to RCA could not be salvaged.  The patient is lightheaded dizzy and  without complaints of chest pain.  I think this is related to anemia and hypotension.  He is off of all antihypertensives with the exception of residual hypotension related to metoprolol.  I am going to decrease his isosorbide mononitrate from 90 mg daily to 60 mg daily to allow for a little bit better blood pressure.  If he begins to have angina symptoms I have advised him to go back up on the dose to 90 mg daily.  He will continue ranolazine 500 mg twice daily.    Metoprolol was decreased during recent hospitalization due to hypotension and he is now at 50 mg twice daily.  Heart rate has been running in the 80s to 90s.  Again I believe this is more related to anemia.  I have discussed this with the family and if his heart rate is between the 100-100 20s sustained and he is symptomatic he will need to take an additional dose of metoprolol call us and we will adjust the dose.  2.  Hypotension: Multifactorial as discussed above.  Decreasing isosorbide from 90 mg daily to 60 mg daily.  May have to uptitrate if he begins to have angina in the setting of anemia.  They are to take his blood pressure daily at the same time every day with him seated with both feet on the floor.  They are to report blood pressures less than 100/50 or blood pressures 170/90 sustained.  They are take given his medications prior to taking his blood pressures.  3.  PAF: Has been placed back on Xarelto 20 mg daily, heart rate is slightly elevated with decreased dose of metoprolol which I believe is more related to anemia.  Most recent EKG reveals atrial paced rhythm on 01/02/2023.  Will not increase metoprolol at this time.  4.  Acute blood loss anemia.  Noted during hospitalization with significant hematuria due to trauma on removing Foley catheter followed by urology.  Most recent labs dated 01/02/2023 hemoglobin 10.4 hematocrit 34.6, more labs that were drawn yesterday at PCP office.  Uncertain results at this time.  The patient is not  short of breath.  He is slightly hypotensive as discussed above.     I have spent 50 minutes with this patient, his family members, answering multiple questions, going over recent history, and hearing about his hospitalizations and treatment.  Multiple questions have been answered.  He will follow-up with his many providers who will answer specific questions about their specific procedures illnesses and follow-up.  Signed, Bettey Mare. Liborio Nixon, ANP, AACC   01/05/2023 5:20 PM      Office [phone] Fax [phone]  Notice: This dictation was prepared with Dragon dictation along with smaller phrase technology. Any transcriptional errors that result from this process are unintentional and may not be corrected upon review.

## 2023-01-05 ENCOUNTER — Encounter: Payer: Self-pay | Admitting: Adult Health

## 2023-01-05 ENCOUNTER — Ambulatory Visit: Payer: Medicare HMO | Attending: Adult Health | Admitting: Adult Health

## 2023-01-05 VITALS — BP 112/54 | HR 95 | Ht 67.0 in | Wt 158.0 lb

## 2023-01-05 DIAGNOSIS — I10 Essential (primary) hypertension: Secondary | ICD-10-CM

## 2023-01-05 DIAGNOSIS — I2510 Atherosclerotic heart disease of native coronary artery without angina pectoris: Secondary | ICD-10-CM

## 2023-01-05 DIAGNOSIS — A419 Sepsis, unspecified organism: Secondary | ICD-10-CM | POA: Diagnosis not present

## 2023-01-05 DIAGNOSIS — I495 Sick sinus syndrome: Secondary | ICD-10-CM

## 2023-01-05 DIAGNOSIS — E611 Iron deficiency: Secondary | ICD-10-CM

## 2023-01-05 DIAGNOSIS — I119 Hypertensive heart disease without heart failure: Secondary | ICD-10-CM | POA: Diagnosis not present

## 2023-01-05 DIAGNOSIS — N390 Urinary tract infection, site not specified: Secondary | ICD-10-CM

## 2023-01-05 DIAGNOSIS — I480 Paroxysmal atrial fibrillation: Secondary | ICD-10-CM | POA: Diagnosis not present

## 2023-01-05 DIAGNOSIS — C9110 Chronic lymphocytic leukemia of B-cell type not having achieved remission: Secondary | ICD-10-CM | POA: Diagnosis not present

## 2023-01-05 DIAGNOSIS — E1121 Type 2 diabetes mellitus with diabetic nephropathy: Secondary | ICD-10-CM | POA: Diagnosis not present

## 2023-01-05 DIAGNOSIS — E1151 Type 2 diabetes mellitus with diabetic peripheral angiopathy without gangrene: Secondary | ICD-10-CM | POA: Diagnosis not present

## 2023-01-05 DIAGNOSIS — T83511A Infection and inflammatory reaction due to indwelling urethral catheter, initial encounter: Secondary | ICD-10-CM | POA: Diagnosis not present

## 2023-01-05 NOTE — Patient Instructions (Signed)
Medication Instructions:  DECREASE YOUR ISOSORBIDE TO 60 MG DAILY.   Lab Work: NONE   Testing/Procedures: NONE   Follow-Up: At [facility], you and your health needs are our priority.  As part of our continuing mission to provide you with exceptional heart care, we have created designated Provider Care Teams.  These Care Teams include your primary Cardiologist (physician) and Advanced Practice Providers (APPs -  Physician Assistants and Nurse Practitioners) who all work together to provide you with the care you need, when you need it.    Your next appointment:   KEEP UPCOMING APPOINTMENT WITH DR. Allyson Sabal  Provider:   Nanetta Batty, MD    Other Instructions KEEP A LOG OF YOUR BLOOD PRESSURES AS WELL AS YOUR WEIGHT.

## 2023-01-08 ENCOUNTER — Telehealth: Payer: Self-pay | Admitting: Cardiovascular Disease

## 2023-01-08 DIAGNOSIS — E1121 Type 2 diabetes mellitus with diabetic nephropathy: Secondary | ICD-10-CM | POA: Diagnosis not present

## 2023-01-08 DIAGNOSIS — I119 Hypertensive heart disease without heart failure: Secondary | ICD-10-CM | POA: Diagnosis not present

## 2023-01-08 DIAGNOSIS — R338 Other retention of urine: Secondary | ICD-10-CM | POA: Diagnosis not present

## 2023-01-08 DIAGNOSIS — I495 Sick sinus syndrome: Secondary | ICD-10-CM | POA: Diagnosis not present

## 2023-01-08 DIAGNOSIS — E1151 Type 2 diabetes mellitus with diabetic peripheral angiopathy without gangrene: Secondary | ICD-10-CM | POA: Diagnosis not present

## 2023-01-08 DIAGNOSIS — T83511A Infection and inflammatory reaction due to indwelling urethral catheter, initial encounter: Secondary | ICD-10-CM | POA: Diagnosis not present

## 2023-01-08 DIAGNOSIS — R8271 Bacteriuria: Secondary | ICD-10-CM | POA: Diagnosis not present

## 2023-01-08 DIAGNOSIS — N390 Urinary tract infection, site not specified: Secondary | ICD-10-CM | POA: Diagnosis not present

## 2023-01-08 DIAGNOSIS — I480 Paroxysmal atrial fibrillation: Secondary | ICD-10-CM | POA: Diagnosis not present

## 2023-01-08 DIAGNOSIS — C9110 Chronic lymphocytic leukemia of B-cell type not having achieved remission: Secondary | ICD-10-CM | POA: Diagnosis not present

## 2023-01-08 DIAGNOSIS — I2510 Atherosclerotic heart disease of native coronary artery without angina pectoris: Secondary | ICD-10-CM | POA: Diagnosis not present

## 2023-01-08 NOTE — Telephone Encounter (Signed)
Spoke with patient and he wanted to know if switching from Tamsulosin  0.4 mg to Silodosin 8 mg would interfere with his cardiac medications.

## 2023-01-08 NOTE — Telephone Encounter (Signed)
His ranexa can increase the effects of silodosin. The recommendation is to monitor for increase effects of silodosin (blood pressure, orthostatics, dizziness).

## 2023-01-08 NOTE — Telephone Encounter (Signed)
Pt c/o medication issue:  1. Name of Medication: Tamsulosin 0.4 mg &  8 mg Silodosin.  2. How are you currently taking this medication (dosage and times per day)?   3. Are you having a reaction (difficulty breathing--STAT)?   4. What is your medication issue? Patient was seen by urologist today and they are wanting to change patient from Tamsulosin .4 mg to the Silodosin 8 mg.

## 2023-01-08 NOTE — Telephone Encounter (Signed)
8mg  is the correct dose of silodosin. You can't compare the two, its like comparing an apple to an orange. They should talk with the prescriber about why he/she wants to make the change.

## 2023-01-08 NOTE — Telephone Encounter (Signed)
His ranexa can increase the effects of silodosin. The recommendation is to monitor for increase effects of silodosin (blood pressure, orthostatics, dizziness).   I went over the the information above with the patient and his son- together. They are concerned about the difference in "dosage"- the Tamsulosin is 0.4mg  and the Silodosin 8mg . Afraid that it would cause more side effects because it is a larger dose. I informed them that these are different medications and recommended dosages are different as well. Told them that I would ask the Pharmacist and get back with them.

## 2023-01-08 NOTE — Telephone Encounter (Signed)
8mg  is the correct dose of silodosin. You can't compare the two, its like comparing an apple to an orange. They should talk with the prescriber about why he/she wants to make the change.     Called and went over the information above. Verbalized understanding.

## 2023-01-10 DIAGNOSIS — I2510 Atherosclerotic heart disease of native coronary artery without angina pectoris: Secondary | ICD-10-CM | POA: Diagnosis not present

## 2023-01-10 DIAGNOSIS — E1151 Type 2 diabetes mellitus with diabetic peripheral angiopathy without gangrene: Secondary | ICD-10-CM | POA: Diagnosis not present

## 2023-01-10 DIAGNOSIS — I119 Hypertensive heart disease without heart failure: Secondary | ICD-10-CM | POA: Diagnosis not present

## 2023-01-10 DIAGNOSIS — I495 Sick sinus syndrome: Secondary | ICD-10-CM | POA: Diagnosis not present

## 2023-01-10 DIAGNOSIS — R310 Gross hematuria: Secondary | ICD-10-CM | POA: Diagnosis not present

## 2023-01-10 DIAGNOSIS — N390 Urinary tract infection, site not specified: Secondary | ICD-10-CM | POA: Diagnosis not present

## 2023-01-10 DIAGNOSIS — E1121 Type 2 diabetes mellitus with diabetic nephropathy: Secondary | ICD-10-CM | POA: Diagnosis not present

## 2023-01-10 DIAGNOSIS — T83511A Infection and inflammatory reaction due to indwelling urethral catheter, initial encounter: Secondary | ICD-10-CM | POA: Diagnosis not present

## 2023-01-10 DIAGNOSIS — C9110 Chronic lymphocytic leukemia of B-cell type not having achieved remission: Secondary | ICD-10-CM | POA: Diagnosis not present

## 2023-01-10 DIAGNOSIS — R338 Other retention of urine: Secondary | ICD-10-CM | POA: Diagnosis not present

## 2023-01-10 DIAGNOSIS — I480 Paroxysmal atrial fibrillation: Secondary | ICD-10-CM | POA: Diagnosis not present

## 2023-01-10 DIAGNOSIS — N401 Enlarged prostate with lower urinary tract symptoms: Secondary | ICD-10-CM | POA: Diagnosis not present

## 2023-01-11 ENCOUNTER — Inpatient Hospital Stay: Payer: Medicare HMO | Attending: Hematology and Oncology

## 2023-01-11 ENCOUNTER — Inpatient Hospital Stay: Payer: Medicare HMO | Admitting: Hematology and Oncology

## 2023-01-11 VITALS — BP 135/72 | HR 96 | Temp 97.7°F | Resp 16 | Wt 159.5 lb

## 2023-01-11 DIAGNOSIS — C9110 Chronic lymphocytic leukemia of B-cell type not having achieved remission: Secondary | ICD-10-CM | POA: Insufficient documentation

## 2023-01-11 DIAGNOSIS — D500 Iron deficiency anemia secondary to blood loss (chronic): Secondary | ICD-10-CM

## 2023-01-11 DIAGNOSIS — Z79899 Other long term (current) drug therapy: Secondary | ICD-10-CM | POA: Diagnosis not present

## 2023-01-11 DIAGNOSIS — D509 Iron deficiency anemia, unspecified: Secondary | ICD-10-CM | POA: Insufficient documentation

## 2023-01-11 DIAGNOSIS — D72820 Lymphocytosis (symptomatic): Secondary | ICD-10-CM

## 2023-01-11 LAB — CMP (CANCER CENTER ONLY)
ALT: 10 U/L (ref 0–44)
AST: 11 U/L — ABNORMAL LOW (ref 15–41)
Albumin: 3.9 g/dL (ref 3.5–5.0)
Alkaline Phosphatase: 61 U/L (ref 38–126)
Anion gap: 7 (ref 5–15)
BUN: 24 mg/dL — ABNORMAL HIGH (ref 8–23)
CO2: 24 mmol/L (ref 22–32)
Calcium: 9.4 mg/dL (ref 8.9–10.3)
Chloride: 109 mmol/L (ref 98–111)
Creatinine: 1.12 mg/dL (ref 0.61–1.24)
GFR, Estimated: >60 mL/min (ref >60)
Glucose, Bld: 192 mg/dL — ABNORMAL HIGH (ref 70–99)
Potassium: 4.3 mmol/L (ref 3.5–5.1)
Sodium: 140 mmol/L (ref 135–145)
Total Bilirubin: 0.5 mg/dL (ref 0.3–1.2)
Total Protein: 6 g/dL — ABNORMAL LOW (ref 6.5–8.1)

## 2023-01-11 LAB — CBC WITH DIFFERENTIAL (CANCER CENTER ONLY)
Abs Immature Granulocytes: 0 10*3/uL (ref 0.00–0.07)
Basophils Absolute: 0 10*3/uL (ref 0.0–0.1)
Basophils Relative: 0 %
Eosinophils Absolute: 0 10*3/uL (ref 0.0–0.5)
Eosinophils Relative: 0 %
HCT: 33.8 % — ABNORMAL LOW (ref 39.0–52.0)
Hemoglobin: 10.8 g/dL — ABNORMAL LOW (ref 13.0–17.0)
Lymphocytes Relative: 80 %
Lymphs Abs: 10.2 10*3/uL — ABNORMAL HIGH (ref 0.7–4.0)
MCH: 27.6 pg (ref 26.0–34.0)
MCHC: 32 g/dL (ref 30.0–36.0)
MCV: 86.2 fL (ref 80.0–100.0)
Monocytes Absolute: 0.6 10*3/uL (ref 0.1–1.0)
Monocytes Relative: 5 %
Neutro Abs: 1.9 10*3/uL (ref 1.7–7.7)
Neutrophils Relative %: 15 %
Platelet Count: 136 10*3/uL — ABNORMAL LOW (ref 150–400)
RBC: 3.92 MIL/uL — ABNORMAL LOW (ref 4.22–5.81)
RDW: 22.3 % — ABNORMAL HIGH (ref 11.5–15.5)
WBC Count: 12.8 10*3/uL — ABNORMAL HIGH (ref 4.0–10.5)
nRBC: 0 % (ref 0.0–0.2)

## 2023-01-11 LAB — IRON AND IRON BINDING CAPACITY (CC-WL,HP ONLY)
Iron: 26 ug/dL — ABNORMAL LOW (ref 45–182)
Saturation Ratios: 9 % — ABNORMAL LOW (ref 17.9–39.5)
TIBC: 302 ug/dL (ref 250–450)
UIBC: 276 ug/dL (ref 117–376)

## 2023-01-11 LAB — FERRITIN: Ferritin: 71 ng/mL (ref 24–336)

## 2023-01-11 LAB — SAMPLE TO BLOOD BANK

## 2023-01-11 NOTE — Progress Notes (Signed)
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 [name]: --WBC 18.6, Hgb 11.7, MCV 80.4, Plt 164, Lymph 12.40.  2)  08/24/2021: Establish care with [facility]emat[facility].  Flow cytometry results most consistent with CLL  Interval History:  Robert Burns 82 y.o. male with medical history significant for newly diagnosed CLL who presents for a follow up visit. The patient's last visit was on 12/14/2022. In the interim since the last visit he had a recent stay in the hospital on 12/23/2022.   On exam today Robert Burns reports he feels better overall in the interim since her last visit.  He reports that he was in the hospital all last month due to a heart attack.  He was also having some difficulty with urination.  He reports he is a virtual colonoscopy coming up later this week.  He reports that his energy levels are improving and he is not having any clear sources of bleeding such as nosebleeds, gum bleeding, or dark stools.  He has had some blood in the urine thought to be secondary to the Foley catheter placement.  He notes that he saw a urologist yesterday who reported that things "looked good".  He reports that he is no longer taking melatonin, Vicodin, and stopped his glipizide.  Otherwise he has no questions concerns or complaints today.  He otherwise denies any fevers, chills, sweats, nausea, vomiting or diarrhea.  Full 10 point ROS is listed below.   MEDICAL HISTORY:  Past Medical History:  Diagnosis Date   Anemia    Anxiety     Arthritis    "all over" (11/25/2015)   CAD (coronary artery disease)    a. s/p CABG  06/2002; b. 02/01/15 PCI: DES to prox SVG to PDA, staged PCI of SVG to Diag in 02/2015; c. 04/2017 Cath/PCI: LM nl, LAD 100ost, 48m, 75d, LCX 60ost, OM2 80, RCA 100ost, RPDA 80, LIMA->LAD ok, VG->D1 patent stent, VG->RPDA patent stent, 50p, VG->OM1->OM2 90p (3.0x24 Synergy DES), 100 between OM1->OM2 (med rx).   Cancer [facility])    Right Shoulder, Left Leg- BCC, SCC, AND MELANOMA   Colon polyp    Elevated cholesterol    Epithelioid hemangioendothelioma    s/p resection of left SFA/mass with interposition 6 mm GoreTex graft 06/02/10, s/p repeat resection for positive margins 09/22/10   High blood pressure    Hyperlipidemia    Hypertension    Leg pain    OSA on CPAP    PAD (peripheral artery disease) (HCC)    a. 10/2002 L SFA PTA/BMS; b. 8/17 LE Angio: LEIA 90 (9x40 self exp stent), LSFA short segment prox occlusion (staged PTA/stenting 01/03/2016), patent mid stent, RSFA 84m (staged PTA/DEB 02/14/2016).   Presence of permanent cardiac pacemaker    Medtronic   Renal insufficiency 12/18/2022   SSS (sick sinus syndrome) (HCC)    a. s/p PPM in 2007 with gen change 04/2015 - Medtronic Adapta ADDRL1, ser # [id number] H.   Type II diabetes mellitus (HCC)    Type II   Urgency of urination     SURGICAL HISTORY:  Past Surgical History:  Procedure Laterality Date   CARDIAC CATHETERIZATION N/A 02/01/2015   Procedure: Left Heart Cath and Coronary Angiography;  Surgeon: Runell Gess, MD;  Location: [facility] INVASIVE CV LAB;  Service: Cardiovascular;  Laterality: N/A;   CARDIAC CATHETERIZATION N/A 02/01/2015   Procedure: Coronary Stent Intervention;  Surgeon: Runell Gess, MD;  Location: [facility] INVASIVE CV LAB;  Service: Cardiovascular;  Laterality: N/A;   CARDIAC CATHETERIZATION  06/2002   "just before bypass OR"   CARDIAC CATHETERIZATION N/A 03/01/2015   Procedure: Coronary Stent Intervention;  Surgeon: Runell Gess, MD;   Location: [facility] INVASIVE CV LAB;  Service: Cardiovascular;  Laterality: N/A;   CARDIAC CATHETERIZATION  02/06/2018   COLONOSCOPY     CORONARY ANGIOPLASTY     CORONARY ARTERY BYPASS GRAFT  06/2002   x5, LIMA-LAD;VG- Diag; seq VG- ramus & OM branch; VG-PDA   CORONARY STENT INTERVENTION N/A 04/12/2017   Procedure: CORONARY STENT INTERVENTION;  Surgeon: Runell Gess, MD;  Location: [facility] INVASIVE CV LAB;  Service: Cardiovascular;  Laterality: N/A;   CORONARY STENT INTERVENTION N/A 02/08/2018   Procedure: CORONARY STENT INTERVENTION;  Surgeon: Corky Crafts, MD;  Location: [facility] INVASIVE CV LAB;  Service: Cardiovascular;  Laterality: N/A;   CORONARY STENT INTERVENTION N/A 11/12/2018   Procedure: CORONARY STENT INTERVENTION;  Surgeon: Iran Ouch, MD;  Location: [facility] INVASIVE CV LAB;  Service: Cardiovascular;  Laterality: N/A;   CORONARY STENT INTERVENTION N/A 06/22/2020   Procedure: CORONARY STENT INTERVENTION;  Surgeon: Corky Crafts, MD;  Location: [facility] INVASIVE CV LAB;  Service: Cardiovascular;  Laterality: N/A;   EP IMPLANTABLE DEVICE N/A 04/23/2015   Procedure: PPM Generator Changeout;  Surgeon: Thurmon Fair, MD;  Location: [facility] INVASIVE CV LAB;  Service: Cardiovascular;  Laterality: N/A;   FALSE ANEURYSM REPAIR Left 11/29/2018   Procedure: REPAIR FALSE ANEURYSM LEFT RADIAL ARTERY;  Surgeon: Larina Earthly, MD;  Location: [facility] OR;  Service: Vascular;  Laterality: Left;   FEMORAL ARTERY STENT Left ~ 2014   "taken out of my leg; couldn' catorgorize what kind so the put it under all 3"; cataroziepitheloid hemanioendotheliomau   FOOT FRACTURE SURGERY Left 1973   FRACTURE SURGERY     INSERT / REPLACE / REMOVE PACEMAKER  10/13/05   right side, medtronic Adapta   KNEE HARDWARE REMOVAL Right 1950's   "3-4 months after the insertion"   KNEE SURGERY Right 1950's   "broke my lower leg; had to put pin in my knee to keep lower leg in place til it healed"   LEFT HEART CATH AND CORS/GRAFTS ANGIOGRAPHY N/A  04/12/2017   Procedure: LEFT HEART CATH AND CORS/GRAFTS ANGIOGRAPHY;  Surgeon: Runell Gess, MD;  Location: [facility] INVAS[facility] CV LAB;  Service: Cardiovascular;  Laterality: N/A;   LEFT HEART CATH AND CORS/GRAFTS ANGIOGRAPHY N/A 02/06/2018   Procedure: LEFT HEART CATH AND CORS/GRAFTS ANGIOGRAPHY;  Surgeon: Lennette Bihari, MD;  Location: [facility] INVASIVE CV LAB;  Service: Cardiovascular;  Laterality: N/A;   LEFT HEART CATH AND CORS/GRAFTS ANGIOGRAPHY N/A 11/12/2018   Procedure: LEFT HEART CATH AND CORS/GRAFTS ANGIOGRAPHY;  Surgeon: Iran Ouch, MD;  Location: [facility] INVASIVE CV LAB;  Service: Cardiovascular;  Laterality: N/A;   LEFT HEART CATH AND CORS/GRAFTS ANGIOGRAPHY N/A 06/22/2020   Procedure: LEFT HEART CATH AND CORS/GRAFTS ANGIOGRAPHY;  Surgeon: Corky Crafts, MD;  Location: [facility] INVASIVE CV LAB;  Service: Cardiovascular;  Laterality: N/A;   LEFT HEART CATH AND CORS/GRAFTS ANGIOGRAPHY N/A 12/01/2022   Procedure: LEFT HEART CATH  AND CORS/GRAFTS ANGIOGRAPHY;  Surgeon: Swazil[place], Peter M, MD;  Location: [facility] INVASIVE CV LAB;  Service: Cardiovascular;  Laterality: N/A;   PERIPHERAL VASCULAR CATHETERIZATION N/A 11/25/2015   Procedure: Lower Extremity Angiography;  Surgeon: Runell Gess, MD;  Location: [facility] INVASIVE CV LAB;  Service: Cardiovascular;  Laterality: N/A;   PERIPHERAL VASCULAR CATHETERIZATION Left 11/25/2015   Procedure: Peripheral Vascular Intervention;  Surgeon: Runell Gess, MD;  Location: [facility] INVASIVE CV LAB;  Service: Cardiovascular;  Laterality: Left;  external iliac   PERIPHERAL VASCULAR CATHETERIZATION N/A 01/03/2016   Procedure: Lower Extremity Angiography;  Surgeon: Runell Gess, MD;  Location: [facility] INVASIVE CV LAB;  Service: Cardiovascular;  Laterality: N/A;   PERIPHERAL VASCULAR CATHETERIZATION Left 01/03/2016   Procedure: Peripheral Vascular Intervention;  Surgeon: Runell Gess, MD;  Location: [facility] INVASIVE CV LAB;  Service: Cardiovascular;  Laterality: Left;  SFA   PERIPHERAL  VASCULAR CATHETERIZATION Right 02/14/2016   Procedure: Peripheral Vascular Atherectomy;  Surgeon: Runell Gess, MD;  Location: [facility] INVASIVE CV LAB;  Service: Cardiovascular;  Laterality: Right;  SFA   POPLITEAL ARTERY STENT  01/03/2016   Contralateral access with a 7 [name] crossover sheath (second order catheter placement)   TONSILLECTOMY AND ADENOIDECTOMY     TUMOR EXCISION Right ~ 2005   cancerous tumor removed from shoulder   TUMOR EXCISION Right ~ 2000   benign tumor removed from under shoulder   TUMOR EXCISION Left 06/02/2010   resection of Lt SFA wth interposition of Gore-Tex graft    SOCIAL HISTORY: Social History   Socioeconomic History   Marital status: Married    Spouse name: Not on file   Number of children: 2   Years of education: Not on file   Highest education level: Not on file  Occupational History   Occupation: retired  Tobacco Use   Smoking status: Former    Types: Pipe    Quit date: 1973    Years since quitting: 51.8   Smokeless tobacco: Never   Tobacco comments:    "quit smoking in 1973"  Vaping Use   Vaping status: Never Used  Substance and Sexual Activity   Alcohol use: No   Drug use: No   Sexual activity: Not Currently  Other Topics Concern   Not on file  Social History Narrative   Not on file   Social Determinants of Health   Financial Resource Strain: Not on file  Food Insecurity: No Food Insecurity (12/18/2022)   Hunger Vital Sign    Worried About Running Out of Food in the Last Year: Never true    Ran Out of Food in the Last Year: Never true  Transportation Needs: No Transportation Needs (12/18/2022)   PRAPARE - Administrator, Civil Service (Medical): No    Lack of Transportation (Non-Medical): No  Physical Activity: Not on file  Stress: No Stress Concern Present (12/07/2022)   Received from [facility] - Occupational Stress Questionnaire    Feeling of Stress : Not at all   Social Connections: Unknown (08/18/2021)   Received from [facility]   Social Network    Social Network: Not on file  Intimate Partner Violence: Not At Risk (12/07/2022)   Received from [facility]   HITS    Over the last 12 months how often did your partner physically hurt you?: 1    Over the last 12 months how often did your partner insult you or talk  down to you?: 1    Over the last 12 months how often did your partner threaten you with physical harm?: 1    Over the last 12 months how often did your partner scream or curse at you?: 1    FAMILY HISTORY: Family History  Problem Relation Age of Onset   Coronary artery disease Mother    Heart attack Mother    Hypertension Mother    Heart disease Mother        Open  Heart surgery   Diabetes Father    Heart disease Father    Hyperlipidemia Father    Heart attack Father    Hypertension Sister    Diabetes Sister    Heart disease Sister    Liver disease Neg Hx    Esophageal cancer Neg Hx    Colon cancer Neg Hx     ALLERGIES:  is allergic to peanut-containing drug products, ace inhibitors, diltiazem hcl, meloxicam, doxycycline, eliquis [apixaban], sertraline hcl, carvedilol, clonidine hcl, and duloxetine hcl.  MEDICATIONS:  Current Outpatient Medications  Medication Sig Dispense Refill   atorvastatin (LIPITOR) 80 MG tablet Take 1 tablet (80 mg total) by mouth daily at 6 PM. 90 tablet 1   Cholecalciferol (VITAMIN D) 50 MCG (2000 UT) CAPS Take 1 capsule by mouth 2 (two) times daily.     finasteride (PROSCAR) 5 MG tablet Take 5 mg by mouth daily.     glipiZIDE (GLUCOTROL) 5 MG tablet Take 5 mg by mouth daily with breakfast.      isosorbide mononitrate (IMDUR) 60 MG 24 hr tablet Take 60 mg by mouth daily.     metFORMIN (GLUCOPHAGE) 1000 MG tablet Take 1 tablet (1,000 mg total) by mouth 2 (two) times daily with a meal. Restart on 3/18.     metoprolol tartrate (LOPRESSOR) 50 MG tablet Take 50 mg by mouth 2 (two)  times daily.     MULTIPLE VITAMIN-FOLIC ACID PO Take 1 tablet by mouth daily.     nitroGLYCERIN (NITROSTAT) 0.4 MG SL tablet Place 1 tablet (0.4 mg total) under the tongue every 5 (five) minutes as needed for chest pain. 25 tablet 3   ranolazine (RANEXA) 500 MG 12 hr tablet Take 500 mg by mouth 2 (two) times daily.     rivaroxaban (XARELTO) 20 MG TABS tablet Take 20 mg by mouth daily with supper.     silodosin (RAPAFLO) 8 MG CAPS capsule Take 8 mg by mouth daily.     No current facility-administered medications for this visit.    REVIEW OF SYSTEMS:   Constitutional: ( - ) fevers, ( - )  chills , ( - ) night sweats Eyes: ( - ) blurriness of vision, ( - ) double vision, ( - ) watery eyes Ears, nose, mouth, throat, and face: ( - ) mucositis, ( - ) sore throat Respiratory: ( - ) cough, ( - ) dyspnea, ( - ) wheezes Cardiovascular: ( - ) palpitation, ( - ) chest discomfort, ( - ) lower extremity swelling Gastrointestinal:  ( - ) nausea, ( - ) heartburn, ( - ) change in bowel habits Skin: ( - ) abnormal skin rashes Lymphatics: ( - ) new lymphadenopathy, ( - ) easy bruising Neurological: ( - ) numbness, ( - ) tingling, ( - ) new weaknesses Behavioral/Psych: ( - ) mood change, ( - ) new changes  All other systems were reviewed with the patient and are negative.  PHYSICAL EXAMINATION: ECOG PERFORMANCE STATUS: 0 - Asymptomatic  Vitals:   01/11/23 1119  BP: 135/72  Pulse: 96  Resp: 16  Temp: 97.7 F (36.5 C)  SpO2: 100%      Filed Weights   01/11/23 1119  Weight: 159 lb 8 oz (72.3 kg)       GENERAL: Well-appearing elderly Caucasian male, alert, no distress and comfortable SKIN: skin color, texture, turgor are normal, no rashes or significant lesions EYES: conjunctiva are pink and non-injected, sclera clear NECK: supple, non-tender LYMPH:  no palpable lymphadenopathy in the cervical, axillary or inguinal LUNGS: clear to auscultation and percussion with normal breathing  effort HEART: regular rate & rhythm and no murmurs and no lower extremity edema ABDOMEN: No evidence of hepatosplenomegaly.  Soft, non-tender, non-distended, normal bowel sounds Musculoskeletal: no cyanosis of digits and no clubbing  PSYCH: alert & oriented x 3, fluent speech NEURO: no focal motor/sensory deficits  LABORATORY DATA:  I have reviewed the data as listed    Latest Ref Rng & Units 01/11/2023    9:50 AM 01/02/2023   11:32 AM 12/23/2022    5:24 AM  CBC  WBC 4.0 - 10.5 K/uL 12.8  13.6  15.4   Hemoglobin 13.0 - 17.0 g/dL 40.9  81.1  8.9   Hematocrit 39.0 - 52.0 % 33.8  34.6  28.6   Platelets 150 - 400 K/uL 136  159  142        Latest Ref Rng & Units 01/11/2023    9:50 AM 01/02/2023   11:32 AM 12/23/2022    5:24 AM  CMP  Glucose 70 - 99 mg/dL 914  782  956   BUN 8 - 23 mg/dL 24  24  19    Creatinine 0.61 - 1.24 mg/dL 2.13  0.86  5.78   Sodium 135 - 145 mmol/L 140  137  141   Potassium 3.5 - 5.1 mmol/L 4.3  4.5  3.8   Chloride 98 - 111 mmol/L 109  108  111   CO2 22 - 32 mmol/L 24  21  21    Calcium 8.9 - 10.3 mg/dL 9.4  8.5  8.3   Total Protein 6.5 - 8.1 g/dL 6.0  5.7    Total Bilirubin 0.3 - 1.2 mg/dL 0.5  0.7    Alkaline Phos 38 - 126 U/L 61  51    AST 15 - 41 U/L 11  18    ALT 0 - 44 U/L 10  14      RADIOGRAPHIC STUDIES: CT VIRTUAL COLONOSCOPY DIAGNOSTIC  Result Date: 01/18/2023 CLINICAL DATA:  Iron deficiency anemia EXAM: CT VIRTUAL COLONOSCOPY DIAGNOSTIC TECHNIQUE: The patient was given a standard bowel preparation with Gastrografin and barium for fluid and stool tagging respectively. The quality of the bowel preparation is poor. Automated CO2 insufflation of the colon was performed prior to image acquisition and colonic distention is poor. Image post processing was used to generate a 3D endoluminal fly-through projection of the colon and to electronically subtract stool/fluid as appropriate. COMPARISON:  12/20/2022 FINDINGS: VIRTUAL COLONOSCOPY There is a large  amount of retained barium throughout the colon. Under distension of the sigmoid colon on both supine and prone imaging limits evaluation. Extensive sigmoid diverticulosis. Wall thickening noted in the mid sigmoid colon presumably related to diverticular disease and muscular hypertrophy although annular constricting lesion cannot be excluded. Also difficult to exclude polypoid lesion in this area due to nondistention. No additional suspicious non barium tagged polypoid filling defects or annular constricting lesion. Virtual colonoscopy is not designed to detect  diminutive polyps (i.e., less than or equal to 5 mm), the presence or absence of which may not affect clinical management. CT ABDOMEN AND PELVIS WITHOUT CONTRAST Lower chest: No acute findings Hepatobiliary: No focal hepatic abnormality. Gallbladder unremarkable. Pancreas: No focal abnormality or ductal dilatation. Spleen: No focal abnormality.  Normal size. Adrenals/Urinary Tract: 8 cm cyst in the left kidney is unchanged since prior study and most compatible with benign cyst. No follow-up imaging recommended. Adrenal glands unremarkable. No stones or hydronephrosis. Urinary bladder decompressed with Foley catheter in place. Stomach/Bowel: Diffuse aortoiliac atherosclerosis. No evidence of aneurysm or adenopathy. Vascular/Lymphatic: Diffuse aortoiliac atherosclerosis. No aneurysm. Borderline periaortic lymph nodes. Enlarged retroperitoneal lymph nodes. Left common iliac node has a short axis diameter of 1.4 cm, stable since prior study. Left external iliac node with a short axis diameter of 1.3 cm, stable. Left inguinal node has a short axis diameter of 9 mm. Right inguinal node has a short axis diameter of 9 mm. Mesenteric adenopathy. Central mesenteric lymph node has a short axis diameter of 1.8 cm. Several other similarly enlarged central mesenteric lymph nodes. These nodes are similar to most recent study, new since 2017. Reproductive: Prostate  enlargement peripheral calcifications. Other: No free fluid or free air. Musculoskeletal: Diffuse degenerative disc and facet disease. No acute bony abnormality. IMPRESSION: Suboptimal study due to a large amount of retained barium throughout the colon and under distension of the sigmoid colon. The study is nondiagnostic in the sigmoid colon. There is extensive sigmoid diverticulosis with area of wall thickening and nondistention in the mid sigmoid colon. This may be related to muscular hypertrophy from diverticular disease, but cannot exclude annular constricting lesion in this area. No suspicious areas in the remainder of the colon. Retroperitoneal and mesenteric adenopathy, similar to recent study, new since 2017. Cannot exclude lymphoproliferative disorder/lymphoma. Consider further evaluation with nuclear medicine PET CT. Electronically Signed   By: Charlett Nose M.D.   On: 01/18/2023 09:35    ASSESSMENT & PLAN Robert Burns 82 y.o. male with medical history significant for newly diagnosed CLL who presents for a follow up visit.  Previously we discussed the diagnosis of CLL and treatment options moving forward.  We discussed that this is a chronic condition with no definitive cure.  Additionally we discussed that treatment is reserved until certain criteria are met.  Typically treatment is started with rapid increase in lymphocytes, massively enlarged lymph nodes, anemia, or thrombocytopenia.  The patient currently does not meet any criteria necessary to start treatment.  As such I would recommend continued close observation of his blood counts and symptoms.  The patient knows to call the clinic with any issues involving fevers, chills, sweats, sudden weight loss, or rapidly enlarging lymph node.  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   --Prognostic panel showed del 13 with positive ZAP 70 and negative IgVH --Labs today show white blood cell count 12.8,  hemoglobin 10.8, MCV 86.2, platelets 136 --No indication for imaging at this time --No criteria met for beginning treatment.  Would recommend continued monitoring --Return to clinic in 6 months time for further evaluation of CLL, however following him more closely for his iron deficiency anemia.  # Anemia -- Appears microcytic, concern for iron deficiency anemia as the etiology. --Iron labs today show ferritin 71 with iron sat of 9% and iron of 26 --Patient notes he will be meeting with gastroenterology shortly as he has an appointment  with them and also has an appointment with urology next week for his hematuria and urinary retention. -- Return to clinic 12 weeks to re-evaluate.   No orders of the defined types were placed in this encounter.   All questions were answered. The patient knows to call the clinic with any problems, questions or concerns.  A total of more than 30 minutes were spent on this encounter with face-to-face time and non-face-to-face time, including preparing to see the patient, ordering tests and/or medications, counseling the patient and coordination of care as outlined above.   Ulysees Barns, MD Department of Hematology/Oncology [facility] Phone: [phone] Pager: [phone] Email: [email or web address]  01/23/2023 1:20 PM  Mathis Fare BD, Catovsky D, Caligaris-Cappio F, Dighiero G, Dhner H, [place], Finzel, [place] E, Chiorazzi N, [place], Rai KR, Allison, Eichhorst B, O'Brien S, Robak T, Seymour JF, Kipps TJ. iwCLL guidelines for diagnosis, indications for treatment, response assessment, and supportive management of CLL. Blood. 2018 Jun 21;131(25):2745-2760.  Active disease should be clearly documented to initiate therapy. At least 1 of the following criteria should be met.  1) Evidence of progressive marrow failure as manifested by the development of, or worsening of, anemia and/or  thrombocytopenia. Cutoff levels of Hb <10 g/dL or platelet counts <295  109/L are generally regarded as indication for treatment. However, in some patients, platelet counts <100  109/L may remain stable over a long period; this situation does not automatically require therapeutic intervention. 2) Massive (ie, >=6 cm below the left costal margin) or progressive or symptomatic splenomegaly. 3) Massive nodes (ie, >=10 cm in longest diameter) or progressive or symptomatic lymphadenopathy. 4) Progressive lymphocytosis with an increase of >=50% over a 23-month period, or lymphocyte doubling time (LDT) <6 months. LDT can be obtained by linear regression extrapolation of absolute lymphocyte counts obtained at intervals of 2 weeks over an observation period of 2 to 3 months; patients with initial blood lymphocyte counts <30  109/L may require a longer observation period to determine the LDT. Factors contributing to lymphocytosis other than CLL (eg, infections, steroid administration) should be excluded. 5) Autoimmune complications including anemia or thrombocytopenia poorly responsive to corticosteroids. 6) Symptomatic or functional extranodal involvement (eg, skin, kidney, lung, spine). Disease-related symptoms as defined by any of the following: Unintentional weight loss >=10% within the previous 6 months. Significant fatigue (ie, ECOG performance scale 2 or worse; cannot work or unable to perform usual activities). Fevers >=100.63F or 38.0C for 2 or more weeks without evidence of infection. Night sweats for >=1 month without evidence of infection.

## 2023-01-12 ENCOUNTER — Other Ambulatory Visit: Payer: Self-pay | Admitting: Urology

## 2023-01-12 DIAGNOSIS — I495 Sick sinus syndrome: Secondary | ICD-10-CM | POA: Diagnosis not present

## 2023-01-12 DIAGNOSIS — N401 Enlarged prostate with lower urinary tract symptoms: Secondary | ICD-10-CM

## 2023-01-12 DIAGNOSIS — I2510 Atherosclerotic heart disease of native coronary artery without angina pectoris: Secondary | ICD-10-CM | POA: Diagnosis not present

## 2023-01-12 DIAGNOSIS — N390 Urinary tract infection, site not specified: Secondary | ICD-10-CM | POA: Diagnosis not present

## 2023-01-12 DIAGNOSIS — E1121 Type 2 diabetes mellitus with diabetic nephropathy: Secondary | ICD-10-CM | POA: Diagnosis not present

## 2023-01-12 DIAGNOSIS — E1151 Type 2 diabetes mellitus with diabetic peripheral angiopathy without gangrene: Secondary | ICD-10-CM | POA: Diagnosis not present

## 2023-01-12 DIAGNOSIS — I480 Paroxysmal atrial fibrillation: Secondary | ICD-10-CM | POA: Diagnosis not present

## 2023-01-12 DIAGNOSIS — C9110 Chronic lymphocytic leukemia of B-cell type not having achieved remission: Secondary | ICD-10-CM | POA: Diagnosis not present

## 2023-01-12 DIAGNOSIS — T83511A Infection and inflammatory reaction due to indwelling urethral catheter, initial encounter: Secondary | ICD-10-CM | POA: Diagnosis not present

## 2023-01-12 DIAGNOSIS — I119 Hypertensive heart disease without heart failure: Secondary | ICD-10-CM | POA: Diagnosis not present

## 2023-01-14 ENCOUNTER — Encounter: Payer: Self-pay | Admitting: Hematology and Oncology

## 2023-01-15 ENCOUNTER — Encounter: Payer: Self-pay | Admitting: Urology

## 2023-01-15 ENCOUNTER — Ambulatory Visit
Admission: RE | Admit: 2023-01-15 | Discharge: 2023-01-15 | Disposition: A | Discharge status: Home / Self Care | Payer: Medicare HMO | Source: Ambulatory Visit | Attending: Internal Medicine | Admitting: Internal Medicine

## 2023-01-15 DIAGNOSIS — D509 Iron deficiency anemia, unspecified: Secondary | ICD-10-CM

## 2023-01-15 DIAGNOSIS — E1151 Type 2 diabetes mellitus with diabetic peripheral angiopathy without gangrene: Secondary | ICD-10-CM | POA: Diagnosis not present

## 2023-01-15 DIAGNOSIS — E1121 Type 2 diabetes mellitus with diabetic nephropathy: Secondary | ICD-10-CM | POA: Diagnosis not present

## 2023-01-15 DIAGNOSIS — N390 Urinary tract infection, site not specified: Secondary | ICD-10-CM | POA: Diagnosis not present

## 2023-01-15 DIAGNOSIS — T83511A Infection and inflammatory reaction due to indwelling urethral catheter, initial encounter: Secondary | ICD-10-CM | POA: Diagnosis not present

## 2023-01-15 DIAGNOSIS — K5730 Diverticulosis of large intestine without perforation or abscess without bleeding: Secondary | ICD-10-CM | POA: Diagnosis not present

## 2023-01-15 DIAGNOSIS — I495 Sick sinus syndrome: Secondary | ICD-10-CM | POA: Diagnosis not present

## 2023-01-15 DIAGNOSIS — D649 Anemia, unspecified: Secondary | ICD-10-CM | POA: Diagnosis not present

## 2023-01-15 DIAGNOSIS — I119 Hypertensive heart disease without heart failure: Secondary | ICD-10-CM | POA: Diagnosis not present

## 2023-01-15 DIAGNOSIS — I2510 Atherosclerotic heart disease of native coronary artery without angina pectoris: Secondary | ICD-10-CM | POA: Diagnosis not present

## 2023-01-15 DIAGNOSIS — Z7901 Long term (current) use of anticoagulants: Secondary | ICD-10-CM

## 2023-01-15 DIAGNOSIS — R599 Enlarged lymph nodes, unspecified: Secondary | ICD-10-CM | POA: Diagnosis not present

## 2023-01-15 DIAGNOSIS — I480 Paroxysmal atrial fibrillation: Secondary | ICD-10-CM | POA: Diagnosis not present

## 2023-01-15 DIAGNOSIS — C9110 Chronic lymphocytic leukemia of B-cell type not having achieved remission: Secondary | ICD-10-CM | POA: Diagnosis not present

## 2023-01-16 ENCOUNTER — Other Ambulatory Visit: Payer: Self-pay | Admitting: Urology

## 2023-01-16 DIAGNOSIS — N138 Other obstructive and reflux uropathy: Secondary | ICD-10-CM

## 2023-01-16 DIAGNOSIS — D485 Neoplasm of uncertain behavior of skin: Secondary | ICD-10-CM | POA: Diagnosis not present

## 2023-01-16 DIAGNOSIS — D235 Other benign neoplasm of skin of trunk: Secondary | ICD-10-CM | POA: Diagnosis not present

## 2023-01-16 DIAGNOSIS — L821 Other seborrheic keratosis: Secondary | ICD-10-CM | POA: Diagnosis not present

## 2023-01-16 DIAGNOSIS — L905 Scar conditions and fibrosis of skin: Secondary | ICD-10-CM | POA: Diagnosis not present

## 2023-01-16 DIAGNOSIS — L570 Actinic keratosis: Secondary | ICD-10-CM | POA: Diagnosis not present

## 2023-01-16 DIAGNOSIS — D0439 Carcinoma in situ of skin of other parts of face: Secondary | ICD-10-CM | POA: Diagnosis not present

## 2023-01-18 ENCOUNTER — Ambulatory Visit
Admission: RE | Admit: 2023-01-18 | Discharge: 2023-01-18 | Disposition: A | Discharge status: Home / Self Care | Payer: Medicare HMO | Source: Ambulatory Visit | Attending: Urology | Admitting: Urology

## 2023-01-18 DIAGNOSIS — R599 Enlarged lymph nodes, unspecified: Secondary | ICD-10-CM | POA: Diagnosis not present

## 2023-01-18 DIAGNOSIS — K5730 Diverticulosis of large intestine without perforation or abscess without bleeding: Secondary | ICD-10-CM | POA: Diagnosis not present

## 2023-01-18 DIAGNOSIS — N401 Enlarged prostate with lower urinary tract symptoms: Secondary | ICD-10-CM

## 2023-01-18 DIAGNOSIS — N400 Enlarged prostate without lower urinary tract symptoms: Secondary | ICD-10-CM | POA: Diagnosis not present

## 2023-01-18 MED ORDER — IOPAMIDOL (ISOVUE-370) INJECTION 76%
100.0000 mL | Freq: Once | INTRAVENOUS | Status: AC | PRN
  Administered 2023-01-18: 100 mL via INTRAVENOUS

## 2023-01-19 DIAGNOSIS — E1121 Type 2 diabetes mellitus with diabetic nephropathy: Secondary | ICD-10-CM | POA: Diagnosis not present

## 2023-01-19 DIAGNOSIS — I119 Hypertensive heart disease without heart failure: Secondary | ICD-10-CM | POA: Diagnosis not present

## 2023-01-19 DIAGNOSIS — N390 Urinary tract infection, site not specified: Secondary | ICD-10-CM | POA: Diagnosis not present

## 2023-01-19 DIAGNOSIS — T83511A Infection and inflammatory reaction due to indwelling urethral catheter, initial encounter: Secondary | ICD-10-CM | POA: Diagnosis not present

## 2023-01-19 DIAGNOSIS — I495 Sick sinus syndrome: Secondary | ICD-10-CM | POA: Diagnosis not present

## 2023-01-19 DIAGNOSIS — I2510 Atherosclerotic heart disease of native coronary artery without angina pectoris: Secondary | ICD-10-CM | POA: Diagnosis not present

## 2023-01-19 DIAGNOSIS — I480 Paroxysmal atrial fibrillation: Secondary | ICD-10-CM | POA: Diagnosis not present

## 2023-01-19 DIAGNOSIS — E1151 Type 2 diabetes mellitus with diabetic peripheral angiopathy without gangrene: Secondary | ICD-10-CM | POA: Diagnosis not present

## 2023-01-19 DIAGNOSIS — C9110 Chronic lymphocytic leukemia of B-cell type not having achieved remission: Secondary | ICD-10-CM | POA: Diagnosis not present

## 2023-01-22 DIAGNOSIS — N390 Urinary tract infection, site not specified: Secondary | ICD-10-CM | POA: Diagnosis not present

## 2023-01-22 DIAGNOSIS — I495 Sick sinus syndrome: Secondary | ICD-10-CM | POA: Diagnosis not present

## 2023-01-22 DIAGNOSIS — I2510 Atherosclerotic heart disease of native coronary artery without angina pectoris: Secondary | ICD-10-CM | POA: Diagnosis not present

## 2023-01-22 DIAGNOSIS — T83511A Infection and inflammatory reaction due to indwelling urethral catheter, initial encounter: Secondary | ICD-10-CM | POA: Diagnosis not present

## 2023-01-22 DIAGNOSIS — E1121 Type 2 diabetes mellitus with diabetic nephropathy: Secondary | ICD-10-CM | POA: Diagnosis not present

## 2023-01-22 DIAGNOSIS — E1151 Type 2 diabetes mellitus with diabetic peripheral angiopathy without gangrene: Secondary | ICD-10-CM | POA: Diagnosis not present

## 2023-01-22 DIAGNOSIS — C9110 Chronic lymphocytic leukemia of B-cell type not having achieved remission: Secondary | ICD-10-CM | POA: Diagnosis not present

## 2023-01-22 DIAGNOSIS — I119 Hypertensive heart disease without heart failure: Secondary | ICD-10-CM | POA: Diagnosis not present

## 2023-01-22 DIAGNOSIS — I480 Paroxysmal atrial fibrillation: Secondary | ICD-10-CM | POA: Diagnosis not present

## 2023-01-29 ENCOUNTER — Ambulatory Visit: Payer: Medicare HMO | Attending: Cardiovascular Disease | Admitting: Cardiovascular Disease

## 2023-01-29 VITALS — BP 130/70 | HR 69 | Ht 67.0 in | Wt 157.0 lb

## 2023-01-29 DIAGNOSIS — I25709 Atherosclerosis of coronary artery bypass graft(s), unspecified, with unspecified angina pectoris: Secondary | ICD-10-CM | POA: Diagnosis not present

## 2023-01-29 DIAGNOSIS — I739 Peripheral vascular disease, unspecified: Secondary | ICD-10-CM

## 2023-01-29 DIAGNOSIS — I495 Sick sinus syndrome: Secondary | ICD-10-CM | POA: Diagnosis not present

## 2023-01-29 DIAGNOSIS — I480 Paroxysmal atrial fibrillation: Secondary | ICD-10-CM

## 2023-01-29 DIAGNOSIS — E1169 Type 2 diabetes mellitus with other specified complication: Secondary | ICD-10-CM

## 2023-01-29 DIAGNOSIS — I10 Essential (primary) hypertension: Secondary | ICD-10-CM | POA: Diagnosis not present

## 2023-01-29 DIAGNOSIS — E785 Hyperlipidemia, unspecified: Secondary | ICD-10-CM | POA: Diagnosis not present

## 2023-01-29 MED ORDER — MULTIPLE VITAMIN-FOLIC ACID PO TABS: 1.0000 | ORAL_TABLET | Freq: Every day | ORAL | Status: AC

## 2023-01-29 NOTE — Assessment & Plan Note (Signed)
History of peripheral arterial disease status post multiple interventions by myself most recently 01/03/2016 involving his external iliac artery.  He denies claudication.  His most recent Doppler studies performed 12/30/2021 revealed a right ABI of 0.72 with moderate disease in his right SFA and a left ABI of 0.97.

## 2023-01-29 NOTE — Progress Notes (Signed)
01/29/2023 Robert Burns   1941-01-23  [id number]  Primary Physician Thana Ates, MD Primary Cardiologist: Runell Gess MD Nicholes Calamity, [place]  HPI:  Robert Burns is a 82 y.o.  married Caucasian male, father of 2, grandfather to 3 grandchildren whose wife Robert Burns who is also a patient of mine. I last saw him in the office 12/20/2021.[name]  He is accompanied by his daughter Robert Burns today and his son was on the phone..  He has a history of CAD status post coronary artery bypass grafting March 2004 with a LIMA to his LAD, a vein to a diagonal branch, a sequential vein to a ramus and OM branch, as well as a vein to the PDA. Last functional study performed July 28, 2011, was entirely normal. 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 He had a pacemaker placed for sick sinus syndrome November 2008 followed by Dr. Royann Shivers. He has obstructive sleep apnea on CPAP. He complain of left thigh pain and I angiogram'd him revealing patent arteries though he did have a space-occupying lesion which was removed by Dr. Hart Rochester with placement of an interposition 6-mm Gore-Tex graft. The pathology was uncertain. 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 Dr. Drue Second follows his lipid profile.Since I saw him back 11/07/12 he has done well.  Followup Dopplers performed in our office 09/27/12 revealed a high-grade lesion in the distal right SFA with an ABI of 0.3. His left ABI 1.1 without obstructive disease.  His most recent lower extremity Doppler Dopplers performed 9/32/16 revealed a right ABI 0.82  And a left ABI of 0.89. Over the last 3 months he's noticed anginal chest pain occurring both at rest and with exertion with left upper extremity radiation. He also complains of left lower extremity discomfort. to moderate anterolateral ischemia. Because of ongoing chest pain and a Myoview that showed anterolateral ischemia and he underwent cardiac catheterization on 02/01/15 revealing  high grade segmental proximal right SVG disease and subtotally occluded diagonal branch SVG. He underwent stenting of his RCA SVG successfully. He does have continued chest pain although somewhat improved since his last procedure.  I brought him back for staged diagonal branch SVG intervention on 03/01/15. This was successful and since that time he denies chest pain or shortness of breath. He underwent a generator change by Dr. Royann Shivers in January for end-of-life of his Medtronic pacemaker. Since I saw him 6 months ago he developed new left calf claudication of the last 3 months. Lower extremity arterial Doppler studies performed today revealed a decline in his left ABI from 0.87 down to 0.59 with an occluded left SFA that is a new finding. 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 He had a short segment occlusion of the proximal left SFA with patent mid left SFA stent. He had 90% segmental calcified mid right SFA stenosis with three-vessel runoff bilaterally. I ended up stenting his left external iliac artery with a 9 mm x 40 mm long nitinol self-expanding  stent which improved his symptoms somewhat although he continued to have lifestyle limiting claudication. He underwent staged intervention of his left SFA on 01/03/16. I stented the entire quadrant totally occluded segment with a 6 mm x 250 mm long Viabahn  Stent. His claudication on the left  has resolved and his ABIs normalized. He now complains  of right leg medication. I did demonstrate a 90% calcified segmental mid right SFA stenosis at the time of angiography 11/25/15. He underwent diamondback orbital rotation atherectomy/drug-eluting angioplasty of his mid calcified right SFA by myself 02/14/16. This follow-up Dopplers performed 02/24/16 revealed normal ABIs bilaterally. His claudication has improved. When I  saw him in the office possibly 3 weeks ago he was complaining of fairly new onset substernal chest pain over the prior several months occurring several times a week. These were new since his RCA and diagonal branch vein graft interventions at the end of 2016. He is on good antianginal medications. A Myoview stress test was ordered that showed subtle inferolateral ischemia and echo showed normal LV function.[name]   He was admitted to the hospital 02/06/2018 for 3 days with unstable angina.  He underwent catheterization by Dr. Tresa Endo on 02/06/2018 revealing disease in all 3 vein grafts as well as in the LAD beyond LIMA insertion, 2 days later he underwent complex intervention on his diagonal and ramus branch vein grafts with restenting as well as intervention on his LAD via LIMA insertion.  His RCA vein graft was not intervened on.  He currently denies angina or claudication.    He was admitted to the hospital with unstable angina on 11/11/2018.  Enzymes were negative.  He underwent catheterization and intervention by Dr. Kirke Corin 11/12/2018 via the left radial approach with stenting of his proximal diagonal branch vein graft in his distal PDA vein graft.  Since discharge, his anginal symptoms have significantly improved although he still has a "heaviness feeling" in his chest.  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  This was subsequently surgically addressed by Dr. Arbie Cookey.   Dr. Royann Shivers did identify significant A. fib burden on remote interrogation of his pacemaker and he was subsequent begun on Eliquis which was transitioned to Xarelto.  He gets occasional chest pain not requiring sublingual nitroglycerin.  His most recent lower extremity arterial Doppler studies revealed patent left iliac and SFAs bilaterally.  Carotid Dopplers performed last December showed moderate right ICA stenosis.   He was admitted to the hospital with unstable angina 06/21/2020 and underwent diagnostic coronary  angiography by Dr. Eldridge Dace via the femoral approach with intervention of his ramus branch vein graft and PDA vein graft with excellent result.  Since discharge on 06/24/2020 he feels clinically improved but was feeling an episode of chest pain while raking leaves although no discomfort while doing water aerobics for 45 minutes.  He was seen by Dr. Carmon Ginsberg 11/09/2020 relating some of the symptoms of chest pain.  A Myoview stress test was ordered on 11/12/2020 which was entirely normal.  At this point, he is on maximum antianginal medications without room to add or increase and given the paucity of symptoms I did not feel compelled to pursue a more invasive approach at this time.  Since I saw him in the office a year ago he said multiple hospitalizations most recently in August with non-STEMI.  He also had genitourinary bleeding requiring transfusion.  He underwent a heart catheterization by Dr. Swaziland 12/01/2022 revealing an occluded native RCA and RCA vein graft which apparently was his "culprit vessel.  Patent LIMA to the LAD, known occluded sequential vein to ramus branch and OM and a patent vein to the diagonal branch with 80% "in-stent restenosis within the proximal vein graft stent.  It was elected to treat this medically.  He does get occasional nitrate responsive angina.  He is on a beta-blocker, long acting oral nitrate and ranolazine.  His clopidogrel was discontinued.   Current Meds  Medication Sig   atorvastatin (LIPITOR) 80 MG tablet Take 1 tablet (80 mg total) by mouth daily at 6 PM.   Cholecalciferol (VITAMIN D) 50 MCG (2000 UT) CAPS Take 1 capsule by mouth 2 (two) times daily.   finasteride (PROSCAR) 5 MG tablet Take 5 mg by mouth daily.   glipiZIDE (GLUCOTROL) 5 MG tablet Take 5 mg by mouth daily with breakfast.    isosorbide mononitrate (IMDUR) 60 MG 24 hr tablet Take 60 mg by mouth daily.   JARDIANCE 10 MG TABS tablet Take 10 mg by mouth daily.   metFORMIN (GLUCOPHAGE) 1000 MG tablet Take  1 tablet (1,000 mg total) by mouth 2 (two) times daily with a meal. Restart on 3/18.   metoprolol tartrate (LOPRESSOR) 50 MG tablet Take 50 mg by mouth 2 (two) times daily.   nitroGLYCERIN (NITROSTAT) 0.4 MG SL tablet Place 1 tablet (0.4 mg total) under the tongue every 5 (five) minutes as needed for chest pain.   ranolazine (RANEXA) 500 MG 12 hr tablet Take 500 mg by mouth 2 (two) times daily.   rivaroxaban (XARELTO) 20 MG TABS tablet Take 20 mg by mouth daily with supper.   silodosin (RAPAFLO) 8 MG CAPS capsule Take 8 mg by mouth daily.   tamsulosin (FLOMAX) 0.4 MG CAPS capsule Take 0.4 mg by mouth daily.     Allergies  Allergen Reactions   Peanut-Containing Drug Products Anaphylaxis and Other (See Comments)    Tongue swelling is severe    Ace Inhibitors Cough        Diltiazem Hcl Other (See Comments)    UNSPECIFIED REACTION     Meloxicam Other (See Comments)    GI upset    Doxycycline Other (See Comments) and Cough    Reaction of cough and runny nose   Eliquis [Apixaban] Other (See Comments)    dizziness   Sertraline Hcl Other (See Comments)   Carvedilol Itching   Clonidine Hcl Other (See Comments)    Patch only - skin irritation   Duloxetine Hcl Other (See Comments)    Urinary frequency    Social History   Socioeconomic History   Marital status: Married    Spouse name: Not on file   Number of children: 2   Years of education: Not on file   Highest education level: Not on file  Occupational History   Occupation: retired  Tobacco Use   Smoking status: Former    Types: Pipe    Quit date: 1973    Years since quitting: 51.8   Smokeless tobacco: Never   Tobacco comments:    "quit smoking in 1973"  Vaping Use   Vaping status: Never Used  Substance and Sexual Activity   Alcohol use: No   Drug use: No   Sexual activity: Not Currently  Other Topics Concern   Not on file  Social History Narrative   Not on file   Social Determinants of Health   Financial  Resource Strain: Not on file  Food Insecurity: No Food Insecurity (12/18/2022)   Hunger Vital Sign    Worried About Running Out of Food in the Last Year: Never true    Ran Out of Food in the Last Year: Never true  Transportation Needs: No Transportation Needs (12/18/2022)   PRAPARE - Administrator, Civil Service (Medical): No    Lack of Transportation (  Non-Medical): No  Physical Activity: Not on file  Stress: No Stress Concern Present (12/07/2022)   Received from [facility] - Occupational Stress Questionnaire    Feeling of Stress : Not at all  Social Connections: Unknown (08/18/2021)   Received from [facility]   Social Network    Social Network: Not on file  Intimate Partner Violence: Not At Risk (12/07/2022)   Received from [facility]   HITS    Over the last 12 months how often did your partner physically hurt you?: 1    Over the last 12 months how often did your partner insult you or talk down to you?: 1    Over the last 12 months how often did your partner threaten you with physical harm?: 1    Over the last 12 months how often did your partner scream or curse at you?: 1     Review of Systems: General: negative for chills, fever, night sweats or weight changes.  Cardiovascular: negative for chest pain, dyspnea on exertion, edema, orthopnea, palpitations, paroxysmal nocturnal dyspnea or shortness of breath Dermatological: negative for rash Respiratory: negative for cough or wheezing Urologic: negative for hematuria Abdominal: negative for nausea, vomiting, diarrhea, bright red blood per rectum, melena, or hematemesis Neurologic: negative for visual changes, syncope, or dizziness All other systems reviewed and are otherwise negative except as noted above.    Blood pressure 130/70, pulse 69, height 5\' 7"  (1.702 m), weight 157 lb (71.2 kg).  General appearance: alert and no distress Neck: no adenopathy, no  carotid bruit, no JVD, supple, symmetrical, trachea midline, and thyroid not enlarged, symmetric, no tenderness/mass/nodules Lungs: clear to auscultation bilaterally Heart: regular rate and rhythm, S1, S2 normal, no murmur, click, rub or gallop Extremities: extremities normal, atraumatic, no cyanosis or edema Pulses: 2+ and symmetric Skin: Skin color, texture, turgor normal. No rashes or lesions Neurologic: Grossly normal  EKG not performed today      ASSESSMENT AND PLAN:   Essential hypertension History of essential hypertension with blood pressure measured today at 130/70.  He is on metoprolol.  SSS (sick sinus syndrome), medtronic adapta History of sick sinus syndrome status post permanent transvenous pacemaker implantation followed by Dr. Royann Shivers.  He does have PAF on Xarelto oral anticoagulation.  Hyperlipidemia due to type 2 diabetes mellitus (HCC) History of hyperlipidemia on high-dose atorvastatin with lipid profile performed 12/01/2022 revealing total cholesterol 61, LDL 23 and HDL 29.  CAD (coronary artery disease) of bypass graft History of CAD status post coronary bypass grafting March 2004 with a LIMA to his LAD, vein to diagonal branch, sequential vein to the OM and a vein to the PDA.  He has had multiple cardiac catheterizations over the years most recently by Dr. Swaziland in the setting of non-STEMI 12/01/2022 revealing an occluded RCA vein graft which was probably "culprit vessel", a total native RCA, faint left-to-right collaterals, patent LIMA to the LAD and an occluded circumflex vein graft as well as a 75 to 80% "in-stent restenosis within a diagonal branch vein graft stent.  This was elected to be treated medically.  The patient does have nitrate responsive chest pain which is fairly well-controlled on Imdur and ranolazine.  Peripheral arterial disease (HCC) History of peripheral arterial disease status post multiple interventions by myself most recently 01/03/2016  involving his external iliac artery.  He denies claudication.  His most recent Doppler studies performed 12/30/2021 revealed a right ABI of 0.72 with  moderate disease in his right SFA and a left ABI of 0.97.  PAF (paroxysmal atrial fibrillation) (HCC) History of PAF status post remote permanent transvenous pacemaker implantation for "sick sinus syndrome" November 2008.  He had a generator change by Dr. Royann Shivers in January 2016.  He currently is on Xarelto.  He has had some genitourinary bleeding requiring transfusion.  His Plavix was discontinued.  His most recent EKG revealed atrial pacing at 61.     Runell Gess MD FACP,FACC,FAHA, [facility] 01/29/2023 4:16 PM

## 2023-01-29 NOTE — Assessment & Plan Note (Signed)
History of sick sinus syndrome status post permanent transvenous pacemaker implantation followed by Dr. Royann Shivers.  He does have PAF on Xarelto oral anticoagulation.

## 2023-01-29 NOTE — Assessment & Plan Note (Signed)
History of CAD status post coronary bypass grafting March 2004 with a LIMA to his LAD, vein to diagonal branch, sequential vein to the OM and a vein to the PDA.  He has had multiple cardiac catheterizations over the years most recently by Dr. Swaziland in the setting of non-STEMI 12/01/2022 revealing an occluded RCA vein graft which was probably "culprit vessel", a total native RCA, faint left-to-right collaterals, patent LIMA to the LAD and an occluded circumflex vein graft as well as a 75 to 80% "in-stent restenosis within a diagonal branch vein graft stent.  This was elected to be treated medically.  The patient does have nitrate responsive chest pain which is fairly well-controlled on Imdur and ranolazine.

## 2023-01-29 NOTE — Assessment & Plan Note (Signed)
History of essential hypertension with blood pressure measured today at 130/70.  He is on metoprolol.

## 2023-01-29 NOTE — Patient Instructions (Signed)
Medication Instructions:  Your physician recommends that you continue on your current medications as directed. Please refer to the Current Medication list given to you today.  *If you need a refill on your cardiac medications before your next appointment, please call your pharmacy*   Follow-Up: At [facility], you and your health needs are our priority.  As part of our continuing mission to provide you with exceptional heart care, we have created designated Provider Care Teams.  These Care Teams include your primary Cardiologist (physician) and Advanced Practice Providers (APPs -  Physician Assistants and Nurse Practitioners) who all work together to provide you with the care you need, when you need it.  We recommend signing up for the patient portal called "MyChart".  Sign up information is provided on this After Visit Summary.  MyChart is used to connect with patients for Virtual Visits (Telemedicine).  Patients are able to view lab/test results, encounter notes, upcoming appointments, etc.  Non-urgent messages can be sent to your provider as well.   To learn more about what you can do with MyChart, go to [email or web address].    Your next appointment:   3 month(s)  Provider:   Jory Sims, DNP, ANP      Then, Quay Burow, MD will plan to see you again in 6 month(s).

## 2023-01-29 NOTE — Assessment & Plan Note (Signed)
History of hyperlipidemia on high-dose atorvastatin with lipid profile performed 12/01/2022 revealing total cholesterol 61, LDL 23 and HDL 29.

## 2023-01-29 NOTE — Assessment & Plan Note (Signed)
History of PAF status post remote permanent transvenous pacemaker implantation for "sick sinus syndrome" November 2008.  He had a generator change by Dr. Royann Shivers in January 2016.  He currently is on Xarelto.  He has had some genitourinary bleeding requiring transfusion.  His Plavix was discontinued.  His most recent EKG revealed atrial pacing at 61.

## 2023-02-01 DIAGNOSIS — E1151 Type 2 diabetes mellitus with diabetic peripheral angiopathy without gangrene: Secondary | ICD-10-CM | POA: Diagnosis not present

## 2023-02-01 DIAGNOSIS — C9110 Chronic lymphocytic leukemia of B-cell type not having achieved remission: Secondary | ICD-10-CM | POA: Diagnosis not present

## 2023-02-01 DIAGNOSIS — I480 Paroxysmal atrial fibrillation: Secondary | ICD-10-CM | POA: Diagnosis not present

## 2023-02-01 DIAGNOSIS — I495 Sick sinus syndrome: Secondary | ICD-10-CM | POA: Diagnosis not present

## 2023-02-01 DIAGNOSIS — N390 Urinary tract infection, site not specified: Secondary | ICD-10-CM | POA: Diagnosis not present

## 2023-02-01 DIAGNOSIS — I119 Hypertensive heart disease without heart failure: Secondary | ICD-10-CM | POA: Diagnosis not present

## 2023-02-01 DIAGNOSIS — I2510 Atherosclerotic heart disease of native coronary artery without angina pectoris: Secondary | ICD-10-CM | POA: Diagnosis not present

## 2023-02-01 DIAGNOSIS — T83511A Infection and inflammatory reaction due to indwelling urethral catheter, initial encounter: Secondary | ICD-10-CM | POA: Diagnosis not present

## 2023-02-01 DIAGNOSIS — E1121 Type 2 diabetes mellitus with diabetic nephropathy: Secondary | ICD-10-CM | POA: Diagnosis not present

## 2023-02-01 NOTE — Progress Notes (Signed)
Reason for visit: BPH with LUTS. Foley catheter dependent.   Care Team(s): Primary Care; Thana Ates, MD Urology; Showalter,Victor C and Anne Fu, NP  History of Present Illness:  Robert Burns is a 82 y.o. male comorbid including CAD s/p CABG(06/2002), Afib on Xarelto, and PAD. Pertinently, recent NSTEMI(12/01/2022) requring cardiac catheterization with post op urinary retention requiring foley catheterization which was complicated by hematuria, requiring ER visit, CBI and transfusion. Pt is referred by his urologist, Dr Jennette Bill, for PAE evaluation and is joined in the consultation by his adult daughter, Robert Burns, who reports that he has previously had multiple catheters in the past for intermittent urinary obstruction.  His voiding trials have been unsuccessful x2, despite medical management with tamsulosin, silodosin / rapaflo and finasteride. They are concerned about his cardiac comorbidities and bleeding risk on anticoagulation, contraindicating urinary operative interventions.  I used a International Prostatism Symptom Score* (IPSS) Score to quantify his symptoms : 17 points (Moderate symptoms)   *Gery Pray MJ, Melene Muller MP, et al. The American Urological Association symptom index for benign prostatic hyperplasia. The Measurement Committee of the American Urological Association. J Urol [id number]; 161:0960.   Review of Systems: A 12 point ROS discussed and pertinent positives are indicated in the HPI above.  All other systems are negative.   Past Medical History:  Diagnosis Date   Anemia    Anxiety    Arthritis    "all over" (11/25/2015)   CAD (coronary artery disease)    a. s/p CABG  06/2002; b. 02/01/15 PCI: DES to prox SVG to PDA, staged PCI of SVG to Diag in 02/2015; c. 04/2017 Cath/PCI: LM nl, LAD 100ost, 77m, 75d, LCX 60ost, OM2 80, RCA 100ost, RPDA 80, LIMA->LAD ok, VG->D1 patent stent, VG->RPDA patent stent, 50p, VG->OM1->OM2 90p (3.0x24 Synergy DES), 100 between  OM1->OM2 (med rx).   Cancer [facility])    Right Shoulder, Left Leg- BCC, SCC, AND MELANOMA   Colon polyp    Elevated cholesterol    Epithelioid hemangioendothelioma    s/p resection of left SFA/mass with interposition 6 mm GoreTex graft 06/02/10, s/p repeat resection for positive margins 09/22/10   High blood pressure    Hyperlipidemia    Hypertension    Leg pain    OSA on CPAP    PAD (peripheral artery disease) (HCC)    a. 10/2002 L SFA PTA/BMS; b. 8/17 LE Angio: LEIA 90 (9x40 self exp stent), LSFA short segment prox occlusion (staged PTA/stenting 01/03/2016), patent mid stent, RSFA 92m (staged PTA/DEB 02/14/2016).   Presence of permanent cardiac pacemaker    Medtronic   Renal insufficiency 12/18/2022   SSS (sick sinus syndrome) (HCC)    a. s/p PPM in 2007 with gen change 04/2015 - Medtronic Adapta ADDRL1, ser # [id number].   Type II diabetes mellitus (HCC)    Type II   Urgency of urination     Past Surgical History:  Procedure Laterality Date   CARDIAC CATHETERIZATION N/A 02/01/2015   Procedure: Left Heart Cath and Coronary Angiography;  Surgeon: Runell Gess, MD;  Location: [facility] INVASIVE CV LAB;  Service: Cardiovascular;  Laterality: N/A;   CARDIAC CATHETERIZATION N/A 02/01/2015   Procedure: Coronary Stent Intervention;  Surgeon: Runell Gess, MD;  Location: [facility] INVASIVE CV LAB;  Service: Cardiovascular;  Laterality: N/A;   CARDIAC CATHETERIZATION  06/2002   "just before bypass OR"   CARDIAC CATHETERIZATION N/A 03/01/2015   Procedure: Coronary Stent Intervention;  Surgeon: Runell Gess, MD;  Location:  [facility] INVASIVE CV LAB;  Service: Cardiovascular;  Laterality: N/A;   CARDIAC CATHETERIZATION  02/06/2018   COLONOSCOPY     CORONARY ANGIOPLASTY     CORONARY ARTERY BYPASS GRAFT  06/2002   x5, LIMA-LAD;VG- Diag; seq VG- ramus & OM branch; VG-PDA   CORONARY STENT INTERVENTION N/A 04/12/2017   Procedure: CORONARY STENT INTERVENTION;  Surgeon: Runell Gess, MD;  Location: [facility] INVASIVE CV  LAB;  Service: Cardiovascular;  Laterality: N/A;   CORONARY STENT INTERVENTION N/A 02/08/2018   Procedure: CORONARY STENT INTERVENTION;  Surgeon: Corky Crafts, MD;  Location: [facility] INVASIVE CV LAB;  Service: Cardiovascular;  Laterality: N/A;   CORONARY STENT INTERVENTION N/A 11/12/2018   Procedure: CORONARY STENT INTERVENTION;  Surgeon: Iran Ouch, MD;  Location: [facility] INVASIVE CV LAB;  Service: Cardiovascular;  Laterality: N/A;   CORONARY STENT INTERVENTION N/A 06/22/2020   Procedure: CORONARY STENT INTERVENTION;  Surgeon: Corky Crafts, MD;  Location: [facility] INVASIVE CV LAB;  Service: Cardiovascular;  Laterality: N/A;   EP IMPLANTABLE DEVICE N/A 04/23/2015   Procedure: PPM Generator Changeout;  Surgeon: Thurmon Fair, MD;  Location: [facility] INVASIVE CV LAB;  Service: Cardiovascular;  Laterality: N/A;   FALSE ANEURYSM REPAIR Left 11/29/2018   Procedure: REPAIR FALSE ANEURYSM LEFT RADIAL ARTERY;  Surgeon: Larina Earthly, MD;  Location: [facility] OR;  Service: Vascular;  Laterality: Left;   FEMORAL ARTERY STENT Left ~ 2014   "taken out of my leg; couldn' catorgorize what kind so the put it under all 3"; cataroziepitheloid hemanioendotheliomau   FOOT FRACTURE SURGERY Left 1973   FRACTURE SURGERY     INSERT / REPLACE / REMOVE PACEMAKER  10/13/05   right side, medtronic Adapta   IR RADIOLOGIST EVAL & MGMT  02/02/2023   KNEE HARDWARE REMOVAL Right 1950's   "3-4 months after the insertion"   KNEE SURGERY Right 1950's   "broke my lower leg; had to put pin in my knee to keep lower leg in place til it healed"   LEFT HEART CATH AND CORS/GRAFTS ANGIOGRAPHY N/A 04/12/2017   Procedure: LEFT HEART CATH AND CORS/GRAFTS ANGIOGRAPHY;  Surgeon: Runell Gess, MD;  Location: [facility] INVASIVE CV LAB;  Service: Cardiovascular;  Laterality: N/A;   LEFT HEART CATH AND CORS/GRAFTS ANGIOGRAPHY N/A 02/06/2018   Procedure: LEFT HEART CATH AND CORS/GRAFTS ANGIOGRAPHY;  Surgeon: Lennette Bihari, MD;  Location: [facility] INVASIVE CV LAB;   Service: Cardiovascular;  Laterality: N/A;   LEFT HEART CATH AND CORS/GRAFTS ANGIOGRAPHY N/A 11/12/2018   Procedure: LEFT HEART CATH AND CORS/GRAFTS ANGIOGRAPHY;  Surgeon: Iran Ouch, MD;  Location: [facility] INVASIVE CV LAB;  Service: Cardiovascular;  Laterality: N/A;   LEFT HEART CATH AND CORS/GRAFTS ANGIOGRAPHY N/A 06/22/2020   Procedure: LEFT HEART CATH AND CORS/GRAFTS ANGIOGRAPHY;  Surgeon: Corky Crafts, MD;  Location: [facility] INVASIVE CV LAB;  Service: Cardiovascular;  Laterality: N/A;   LEFT HEART CATH AND CORS/GRAFTS ANGIOGRAPHY N/A 12/01/2022   Procedure: LEFT HEART CATH AND CORS/GRAFTS ANGIOGRAPHY;  Surgeon: [place], Peter M, MD;  Location: [facility] INVASIVE CV LAB;  Service: Cardiovascular;  Laterality: N/A;   PERIPHERAL VASCULAR CATHETERIZATION N/A 11/25/2015   Procedure: Lower Extremity Angiography;  Surgeon: Runell Gess, MD;  Location: [facility] INVASIVE CV LAB;  Service: Cardiovascular;  Laterality: N/A;   PERIPHERAL VASCULAR CATHETERIZATION Left 11/25/2015   Procedure: Peripheral Vascular Intervention;  Surgeon: Runell Gess, MD;  Location: [facility] INVASIVE CV LAB;  Service: Cardiovascular;  Laterality: Left;  external iliac   PERIPHERAL VASCULAR CATHETERIZATION N/A 01/03/2016  Procedure: Lower Extremity Angiography;  Surgeon: Runell Gess, MD;  Location: [facility] INVASIVE CV LAB;  Service: Cardiovascular;  Laterality: N/A;   PERIPHERAL VASCULAR CATHETERIZATION Left 01/03/2016   Procedure: Peripheral Vascular Intervention;  Surgeon: Runell Gess, MD;  Location: [facility] INVASIVE CV LAB;  Service: Cardiovascular;  Laterality: Left;  SFA   PERIPHERAL VASCULAR CATHETERIZATION Right 02/14/2016   Procedure: Peripheral Vascular Atherectomy;  Surgeon: Runell Gess, MD;  Location: [facility] INVASIVE CV LAB;  Service: Cardiovascular;  Laterality: Right;  SFA   POPLITEAL ARTERY STENT  01/03/2016   Contralateral access with a 7 [name] crossover sheath (second order catheter placement)   TONSILLECTOMY AND  ADENOIDECTOMY     TUMOR EXCISION Right ~ 2005   cancerous tumor removed from shoulder   TUMOR EXCISION Right ~ 2000   benign tumor removed from under shoulder   TUMOR EXCISION Left 06/02/2010   resection of Lt SFA wth interposition of Gore-Tex graft    Allergies: Peanut-containing drug products, Ace inhibitors, Diltiazem hcl, Meloxicam, Doxycycline, Eliquis [apixaban], Sertraline hcl, Carvedilol, Clonidine hcl, and Duloxetine hcl  Medications: Prior to Admission medications   Medication Sig Start Date End Date Taking? Authorizing Provider  atorvastatin (LIPITOR) 80 MG tablet Take 1 tablet (80 mg total) by mouth daily at 6 PM. 02/09/18   Janetta Hora, PA-C  Cholecalciferol (VITAMIN D) 50 MCG (2000 UT) CAPS Take 1 capsule by mouth 2 (two) times daily.    [provider]  finasteride (PROSCAR) 5 MG tablet Take 5 mg by mouth daily.    [provider]  glipiZIDE (GLUCOTROL) 5 MG tablet Take 5 mg by mouth daily with breakfast.  01/10/18   [provider]  isosorbide mononitrate (IMDUR) 60 MG 24 hr tablet Take 60 mg by mouth daily.    [provider]  JARDIANCE 10 MG TABS tablet Take 10 mg by mouth daily. 01/12/23   [provider]  metFORMIN (GLUCOPHAGE) 1000 MG tablet Take 1 tablet (1,000 mg total) by mouth 2 (two) times daily with a meal. Restart on 3/18. 06/23/20   [facility], Roe Rutherford, PA  metoprolol tartrate (LOPRESSOR) 50 MG tablet Take 50 mg by mouth 2 (two) times daily.    [provider]  Multiple Vitamin-Folic Acid TABS Take 1 tablet by mouth daily. 01/29/23   Runell Gess, MD  nitroGLYCERIN (NITROSTAT) 0.4 MG SL tablet Place 1 tablet (0.4 mg total) under the tongue every 5 (five) minutes as needed for chest pain. 11/21/22   Jodelle Gross, NP  ranolazine (RANEXA) 500 MG 12 hr tablet Take 500 mg by mouth 2 (two) times daily.    [provider]  rivaroxaban (XARELTO) 20 MG TABS tablet Take 20 mg by mouth daily with  supper.    [provider]  silodosin (RAPAFLO) 8 MG CAPS capsule Take 8 mg by mouth daily.    [provider]  tamsulosin (FLOMAX) 0.4 MG CAPS capsule Take 0.4 mg by mouth daily.    [provider]     Family History  Problem Relation Age of Onset   Coronary artery disease Mother    Heart attack Mother    Hypertension Mother    Heart disease Mother        Open  Heart surgery   Diabetes Father    Heart disease Father    Hyperlipidemia Father    Heart attack Father    Hypertension Sister    Diabetes Sister    Heart disease Sister  Liver disease Neg Hx    Esophageal cancer Neg Hx    Colon cancer Neg Hx     Social History   Socioeconomic History   Marital status: Married    Spouse name: Not on file   Number of children: 2   Years of education: Not on file   Highest education level: Not on file  Occupational History   Occupation: retired  Tobacco Use   Smoking status: Former    Types: Pipe    Quit date: 1973    Years since quitting: 51.8   Smokeless tobacco: Never   Tobacco comments:    "quit smoking in 1973"  Vaping Use   Vaping status: Never Used  Substance and Sexual Activity   Alcohol use: No   Drug use: No   Sexual activity: Not Currently  Other Topics Concern   Not on file  Social History Narrative   Not on file   Social Determinants of Health   Financial Resource Strain: Not on file  Food Insecurity: No Food Insecurity (12/18/2022)   Hunger Vital Sign    Worried About Running Out of Food in the Last Year: Never true    Ran Out of Food in the Last Year: Never true  Transportation Needs: No Transportation Needs (12/18/2022)   PRAPARE - Administrator, Civil Service (Medical): No    Lack of Transportation (Non-Medical): No  Physical Activity: Not on file  Stress: No Stress Concern Present (12/07/2022)   Received from [facility] Occupational Health - Occupational Stress Questionnaire     Feeling of Stress : Not at all  Social Connections: Unknown (08/18/2021)   Received from [name]   Social Network    Social Network: Not on file    Review of Systems As above  Vital Signs: BP 114/67   Pulse 97   Temp 97.8 F (36.6 C)   Resp 20   SpO2 99%   Physical Exam  General: WN, NAD  CV: RRR on monitor Pulm: normal work of breathing on RA Abd: S, ND, NT GU: foley catheter in place MSK: Grossly normal Psych: Appropriate affect.  Imaging:  CTA AP, 01/18/23 Independently reviewed demonstrating prostatomegaly L hypogastric artery is stenosed at origin. R prostatic artery is not definitely visualized. Prostate measures 5.4 x 5.7 x 6.3 cm, with an estimated calculated volume of 102 mL    CT Angio Abd/Pel w/ and/or w/o  Result Date: 01/25/2023 CLINICAL DATA:  82 year old male with history of benign prostatic hyperplasia and urinary obstruction. EXAM: CT ANGIOGRAPHY ABDOMEN AND PELVIS WITH CONTRAST AND WITHOUT CONTRAST TECHNIQUE: Multidetector CT imaging of the abdomen and pelvis was performed using the standard protocol during bolus administration of intravenous contrast. Multiplanar reconstructed images and MIPs were obtained and reviewed to evaluate the vascular anatomy. RADIATION DOSE REDUCTION: This exam was performed according to the departmental dose-optimization program which includes automated exposure control, adjustment of the mA and/or kV according to patient size and/or use of iterative reconstruction technique. CONTRAST:  ISOVUE-370 IOPAMIDOL (ISOVUE-370) INJECTION 76% COMPARISON:  12/20/2022, 01/15/2023 FINDINGS: VASCULAR Aorta: Patent and normal caliber throughout with similar appearing circumferential fibrofatty and calcific atherosclerotic changes, most prominent about the infrarenal segment. Celiac: Mild ostial stenosis secondary to atherosclerotic plaque with minimal poststenotic fusiform ectasia. Otherwise patent. SMA: Mild ostial  stenosis secondary to fibrofatty and calcific atherosclerotic plaque. Patent distally. Renals: Dual bilateral renal arteries. The main, inferior right renal artery demonstrates moderate ostial  stenosis secondary to atherosclerotic calcification. The main, superior left renal artery demonstrates mild ostial stenosis secondary to calcified atherosclerotic plaque. There is severe focal stenosis about the left inferior renal artery secondary to calcified atherosclerotic plaque. IMA: Moderate ostial stenosis secondary to a atherosclerotic calcification of the abdominal aorta. Distally. Inflow: Coarse circumferential calcified atherosclerotic plaque about the common iliac arteries bilaterally. The bilateral internal and external iliac arteries are patent with scattered atherosclerotic calcifications. The left prostatic artery appears to arise from the proximal internal pudendal artery. The right prostatic artery is not well visualized. Proximal Outflow: Bilateral common femoral and visualized portions of the superficial and profunda femoral arteries are patent without evidence of aneurysm, dissection, vasculitis or significant stenosis. Scattered atherosclerotic calcifications. Veins: No obvious venous abnormality within the limitations of this arterial phase study. Review of the MIP images confirms the above findings. NON-VASCULAR Lower chest: No acute abnormality. Hepatobiliary: No focal liver abnormality is seen. No gallstones, gallbladder wall thickening, or biliary dilatation. Pancreas: Unremarkable. No pancreatic ductal dilatation or surrounding inflammatory changes. Spleen: Normal in size without focal abnormality. Adrenals/Urinary Tract: Adrenal glands are unremarkable. Similar appearing diffuse thinning of the renal cortices bilaterally. Unchanged simple exophytic renal cyst arising from the anterior aspect of the interpolar region of the left kidney measuring up to 7.9 cm, not requiring additional follow-up.  There are few distal scattered subcentimeter simple cysts bilaterally. No nephrolithiasis or hydronephrosis. The bladder is decompressed with indwelling Foley catheter. Stomach/Bowel: Stomach is within normal limits. Appendix is not definitively identified. Retained enteric contrast is visualized throughout the colon. Again seen are multifocal diverticula descending and sigmoid colon without surrounding inflammatory changes. No evidence of bowel wall thickening, distention, or inflammatory changes. Lymphatic: Multifocal enlarged central mesenteric, retroperitoneal, iliac, and inguinal lymph nodes, the largest measuring up to approximately 2.1 cm in short axis in the left inferior iliac chain, similar to comparison. Reproductive: The prostate is enlarged measuring approximately 5.4 x 5.7 x 6.3 cm (AP by trans by cc) with a calculated volume of 102 g. There are few scattered dystrophic calcifications. Other: No abdominal wall hernia or abnormality. No abdominopelvic ascites. Musculoskeletal: No acute osseous abnormality. Similar appearing multilevel degenerative changes of the visualized thoracolumbar spine, most pronounced at L4-L5 and L5-S1. IMPRESSION: VASCULAR 1. 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 2. The right prostatic artery is not definitively visualized. 3. Aortic Atherosclerosis (ICD10-I70.0). NON-VASCULAR 1. Prostatomegaly with a calculated volume of approximately 102 g. 2. Unchanged mesenteric, retroperitoneal, iliac, and inguinal lymphadenopathy. Again, PET-CT could be considered for further characterization. 3. Diverticulosis Marliss Coots, MD [facility] Radiology Electronically Signed   By: Marliss Coots M.D.   On: 01/25/2023 07:43    Labs:  CBC: Recent Labs    12/22/22 1226 12/23/22 0524 01/02/23 1132 01/11/23 0950  WBC 14.6* 15.4* 13.6* 12.8*  HGB 8.3* 8.9* 10.4* 10.8*   HCT 27.5* 28.6* 34.6* 33.8*  PLT 140* 142* 159 136*    COAGS: Recent Labs    11/30/22 2303 12/01/22 0329 12/18/22 1128  INR  --   --  2.8*  APTT 52* 66* 39*    BMP: Recent Labs    12/22/22 1226 12/23/22 0524 01/02/23 1132 01/11/23 0950  NA 139 141 137 140  K 4.3 3.8 4.5 4.3  CL 110 111 108 109  CO2 22 21* 21* 24  GLUCOSE 241* 130* 115* 192*  BUN 19 19 24* 24*  CALCIUM 8.1* 8.3* 8.5*  9.4  CREATININE 1.14 1.08 0.93 1.12  GFRNONAA >60 >60 >60 >60    Assessment and Plan:  82 y/o M comorbid including CAD s/p CABG(06/2002), Afib on Xarelto, and PAD. Recent NSTEMI(12/01/2022) requring cardiac catheterization with post op urinary retention requiring foley catheterization which was complicated by hematuria, requiring ER visit, CBI and transfusion.   Prostatomegaly (~100 mL) w intermittent hematuria and currently foley catheter dependent s/p failed voiding trials. PSA, last unavailable IPSS Score Total, 17 points (Moderate symptoms) QoL, decreased (5, "Unhappy"), on tamsulosin, 0.4 mg QD, silodosin 8 mg QD and finasteride 5 mg QD  Given the nature of the disease, and concern for general anesthesia given significant cardiac risk, I felt that the patient would benefit from Prostate Artery Embolization (PAE).    The procedure has been fully reviewed with the patient/patient's authorized representative. The risks, benefits and alternatives of PAE were fully discussed including technique, potential complications, timeline of improvement, efficacy and durability. The patient/patient's authorized representative has consented to the procedure.  *CTA AP 01/18/23 reviewed. No additional imaging required. *OK to proceed to schedule PAE on mutual availability.  *Same day procedure, no overnight admission. Procedure to be performed at [facility] *given Pt height, < 6 ft, will plan for trans-radial access. *No AC (xarelto) hold required. *on Metformin. Request 2 day hold prior to procedure and  resume 2 days post op, to reduce risk of LA with iodinated contrast  *Rocephin for pre op Abx *CBC / Coags / PSA to be drawn on procedure day  Thank you for this interesting consult.  I greatly enjoyed meeting Robert Burns and look forward to participating in their care.  A copy of this report was sent to the requesting provider on this date.  Electronically Signed:  Roanna Banning, MD [facility] Radiology   Pager. [phone] Clinic. [phone]  I spent a total of 45 Minutes in face to face in clinical consultation, greater than 50% of which was counseling/coordinating care for Robert Burns's BPH and LUTS management

## 2023-02-02 ENCOUNTER — Ambulatory Visit
Admission: RE | Admit: 2023-02-02 | Discharge: 2023-02-02 | Disposition: A | Discharge status: Home / Self Care | Payer: Medicare HMO | Source: Ambulatory Visit | Attending: Urology | Admitting: Urology

## 2023-02-02 DIAGNOSIS — I480 Paroxysmal atrial fibrillation: Secondary | ICD-10-CM | POA: Diagnosis not present

## 2023-02-02 DIAGNOSIS — N401 Enlarged prostate with lower urinary tract symptoms: Secondary | ICD-10-CM | POA: Diagnosis not present

## 2023-02-02 DIAGNOSIS — I25709 Atherosclerosis of coronary artery bypass graft(s), unspecified, with unspecified angina pectoris: Secondary | ICD-10-CM | POA: Diagnosis not present

## 2023-02-02 DIAGNOSIS — G4733 Obstructive sleep apnea (adult) (pediatric): Secondary | ICD-10-CM | POA: Diagnosis not present

## 2023-02-02 DIAGNOSIS — N138 Other obstructive and reflux uropathy: Secondary | ICD-10-CM | POA: Diagnosis not present

## 2023-02-02 DIAGNOSIS — C9110 Chronic lymphocytic leukemia of B-cell type not having achieved remission: Secondary | ICD-10-CM | POA: Diagnosis not present

## 2023-02-02 DIAGNOSIS — D6869 Other thrombophilia: Secondary | ICD-10-CM | POA: Diagnosis not present

## 2023-02-02 DIAGNOSIS — I110 Hypertensive heart disease with heart failure: Secondary | ICD-10-CM | POA: Diagnosis not present

## 2023-02-02 DIAGNOSIS — E1142 Type 2 diabetes mellitus with diabetic polyneuropathy: Secondary | ICD-10-CM | POA: Diagnosis not present

## 2023-02-02 DIAGNOSIS — Z0000 Encounter for general adult medical examination without abnormal findings: Secondary | ICD-10-CM | POA: Diagnosis not present

## 2023-02-02 HISTORY — PX: IR RADIOLOGIST EVAL & MGMT: IMG5224

## 2023-02-04 DIAGNOSIS — I10 Essential (primary) hypertension: Secondary | ICD-10-CM | POA: Diagnosis not present

## 2023-02-04 DIAGNOSIS — I252 Old myocardial infarction: Secondary | ICD-10-CM | POA: Diagnosis not present

## 2023-02-04 DIAGNOSIS — T83091A Other mechanical complication of indwelling urethral catheter, initial encounter: Secondary | ICD-10-CM | POA: Diagnosis not present

## 2023-02-04 DIAGNOSIS — N3000 Acute cystitis without hematuria: Secondary | ICD-10-CM | POA: Diagnosis not present

## 2023-02-04 DIAGNOSIS — E119 Type 2 diabetes mellitus without complications: Secondary | ICD-10-CM | POA: Diagnosis not present

## 2023-02-04 DIAGNOSIS — Z7901 Long term (current) use of anticoagulants: Secondary | ICD-10-CM | POA: Diagnosis not present

## 2023-02-04 DIAGNOSIS — Z87891 Personal history of nicotine dependence: Secondary | ICD-10-CM | POA: Diagnosis not present

## 2023-02-04 DIAGNOSIS — B9689 Other specified bacterial agents as the cause of diseases classified elsewhere: Secondary | ICD-10-CM | POA: Diagnosis not present

## 2023-02-04 DIAGNOSIS — T83011A Breakdown (mechanical) of indwelling urethral catheter, initial encounter: Secondary | ICD-10-CM | POA: Diagnosis not present

## 2023-02-04 DIAGNOSIS — N400 Enlarged prostate without lower urinary tract symptoms: Secondary | ICD-10-CM | POA: Diagnosis not present

## 2023-02-05 DIAGNOSIS — N3000 Acute cystitis without hematuria: Secondary | ICD-10-CM | POA: Diagnosis not present

## 2023-02-05 DIAGNOSIS — T839XXD Unspecified complication of genitourinary prosthetic device, implant and graft, subsequent encounter: Secondary | ICD-10-CM | POA: Diagnosis not present

## 2023-02-06 DIAGNOSIS — H35352 Cystoid macular degeneration, left eye: Secondary | ICD-10-CM | POA: Diagnosis not present

## 2023-02-06 DIAGNOSIS — H401234 Low-tension glaucoma, bilateral, indeterminate stage: Secondary | ICD-10-CM | POA: Diagnosis not present

## 2023-02-06 DIAGNOSIS — H34811 Central retinal vein occlusion, right eye, with macular edema: Secondary | ICD-10-CM | POA: Diagnosis not present

## 2023-02-06 DIAGNOSIS — Z961 Presence of intraocular lens: Secondary | ICD-10-CM | POA: Diagnosis not present

## 2023-02-06 DIAGNOSIS — H35372 Puckering of macula, left eye: Secondary | ICD-10-CM | POA: Diagnosis not present

## 2023-02-07 ENCOUNTER — Other Ambulatory Visit (HOSPITAL_COMMUNITY): Payer: Self-pay | Admitting: Interventional Radiology

## 2023-02-07 DIAGNOSIS — N138 Other obstructive and reflux uropathy: Secondary | ICD-10-CM

## 2023-02-12 ENCOUNTER — Other Ambulatory Visit: Payer: Self-pay | Admitting: Cardiovascular Disease

## 2023-02-12 DIAGNOSIS — L570 Actinic keratosis: Secondary | ICD-10-CM | POA: Diagnosis not present

## 2023-02-12 DIAGNOSIS — D0439 Carcinoma in situ of skin of other parts of face: Secondary | ICD-10-CM | POA: Diagnosis not present

## 2023-02-12 NOTE — Telephone Encounter (Signed)
Prescription refill request for Xarelto received.  Indication: Afib  Last office visit: 01/29/23 Robert Burns)  Weight: 71.2kg Age: 82 Scr: 1.12 (01/11/23)   CrCl: 49.61ml/min  Per dosing criteria, current dose not appropriate. Will forward to PharmD for review.

## 2023-02-12 NOTE — Telephone Encounter (Signed)
*  STAT* If patient is at the pharmacy, call can be transferred to refill team.   1. Which medications need to be refilled? (please list name of each medication and dose if known) rivaroxaban (XARELTO) 20 MG TABS tablet    4. Which pharmacy/location (including street and city if local pharmacy) is medication to be sent to? [facility] - [place], [place] - [address] Phone: [phone]  Fax: [phone]       5. Do they need a 30 day or 90 day supply? 90

## 2023-02-13 ENCOUNTER — Ambulatory Visit (INDEPENDENT_AMBULATORY_CARE_PROVIDER_SITE_OTHER): Payer: Medicare HMO

## 2023-02-13 DIAGNOSIS — I495 Sick sinus syndrome: Secondary | ICD-10-CM | POA: Diagnosis not present

## 2023-02-14 MED ORDER — RIVAROXABAN 20 MG PO TABS: 20.0000 mg | ORAL_TABLET | Freq: Every day | ORAL | 0 refills | Status: DC

## 2023-02-14 NOTE — Telephone Encounter (Signed)
Recommend staying on Xarelto 20mg 

## 2023-02-15 DIAGNOSIS — C9110 Chronic lymphocytic leukemia of B-cell type not having achieved remission: Secondary | ICD-10-CM | POA: Diagnosis not present

## 2023-02-15 DIAGNOSIS — N390 Urinary tract infection, site not specified: Secondary | ICD-10-CM | POA: Diagnosis not present

## 2023-02-15 DIAGNOSIS — E1151 Type 2 diabetes mellitus with diabetic peripheral angiopathy without gangrene: Secondary | ICD-10-CM | POA: Diagnosis not present

## 2023-02-15 DIAGNOSIS — I495 Sick sinus syndrome: Secondary | ICD-10-CM | POA: Diagnosis not present

## 2023-02-15 DIAGNOSIS — I480 Paroxysmal atrial fibrillation: Secondary | ICD-10-CM | POA: Diagnosis not present

## 2023-02-15 DIAGNOSIS — I2510 Atherosclerotic heart disease of native coronary artery without angina pectoris: Secondary | ICD-10-CM | POA: Diagnosis not present

## 2023-02-15 DIAGNOSIS — E1121 Type 2 diabetes mellitus with diabetic nephropathy: Secondary | ICD-10-CM | POA: Diagnosis not present

## 2023-02-15 DIAGNOSIS — T83511A Infection and inflammatory reaction due to indwelling urethral catheter, initial encounter: Secondary | ICD-10-CM | POA: Diagnosis not present

## 2023-02-15 DIAGNOSIS — I119 Hypertensive heart disease without heart failure: Secondary | ICD-10-CM | POA: Diagnosis not present

## 2023-02-15 LAB — CUP PACEART REMOTE DEVICE CHECK
Battery Impedance: 1024 Ohm
Battery Remaining Longevity: 66 mo
Battery Voltage: 2.78 V
Brady Statistic AP VP Percent: 2 %
Brady Statistic AP VS Percent: 96 %
Brady Statistic AS VP Percent: 0 %
Brady Statistic AS VS Percent: 2 %
Date Time Interrogation Session: 20241106142626
Implantable Lead Connection Status: 753985
Implantable Lead Connection Status: 753985
Implantable Lead Implant Date: 20070706
Implantable Lead Implant Date: 20070706
Implantable Lead Location: 753859
Implantable Lead Location: 753860
Implantable Lead Model: 5076
Implantable Lead Model: 5092
Implantable Pulse Generator Implant Date: 20170113
Lead Channel Impedance Value: 412 Ohm
Lead Channel Impedance Value: 767 Ohm
Lead Channel Pacing Threshold Amplitude: 0.75 V
Lead Channel Pacing Threshold Amplitude: 1.375 V
Lead Channel Pacing Threshold Pulse Width: 0.4 ms
Lead Channel Pacing Threshold Pulse Width: 0.4 ms
Lead Channel Setting Pacing Amplitude: 1.5 V
Lead Channel Setting Pacing Amplitude: 2.75 V
Lead Channel Setting Pacing Pulse Width: 0.4 ms
Lead Channel Setting Sensing Sensitivity: 5.6 mV
Zone Setting Status: 755011
Zone Setting Status: 755011

## 2023-02-23 ENCOUNTER — Other Ambulatory Visit: Payer: Self-pay | Admitting: Radiology

## 2023-02-23 DIAGNOSIS — N401 Enlarged prostate with lower urinary tract symptoms: Secondary | ICD-10-CM

## 2023-02-25 NOTE — H&P (Signed)
Chief Complaint: Patient was seen in consultation today for bilateral prostate artery embolization for management of BPH with urinary retention, at the request of Dr Jennette Bill.  Supervising Physician: Roanna Banning  Patient Status: [facility] - Out-pt  History of Present Illness: Robert Burns is a 82 y.o. male   FULL Code status per patient and chart.   Patient is known to IR, and consulted with Dr. Milford Cage for bilateral prostate artery embolization . Per his note on 10/25: Robert Burns is a 82 y.o. male comorbid including CAD s/p CABG(06/2002), Afib on Xarelto, and PAD. Pertinently, recent NSTEMI(12/01/2022) requring cardiac catheterization with post op urinary retention requiring foley catheterization which was complicated by hematuria, requiring ER visit, CBI and transfusion.   Pt is referred by his urologist, Dr Jennette Bill, for PAE evaluation. He has previously had multiple catheters in the past for intermittent urinary obstruction.  His voiding trials have been unsuccessful x2, despite medical management with tamsulosin, silodosin / rapaflo and finasteride. Patient and his daughter are concerned about his cardiac comorbidities and bleeding risk on anticoagulation, contraindicating urinary operative interventions.   Patient underwent CT angio A/P w/wo on 10/10: IMPRESSION: VASCULAR   1. 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 2. The right prostatic artery is not definitively visualized. 3. Aortic Atherosclerosis (ICD10-I70.0).   NON-VASCULAR   1. Prostatomegaly with a calculated volume of approximately 102 g. 2. Unchanged mesenteric, retroperitoneal, iliac, and inguinal lymphadenopathy. Again, PET-CT could be considered for further characterization. 3. Diverticulosis   Marliss Coots, MD   Patient is has been approved and is scheduled for bilateral prostate artery embolization in IR today.   Past Medical  History:  Diagnosis Date   Anemia    Anxiety    Arthritis    "all over" (11/25/2015)   CAD (coronary artery disease)    a. s/p CABG  06/2002; b. 02/01/15 PCI: DES to prox SVG to PDA, staged PCI of SVG to Diag in 02/2015; c. 04/2017 Cath/PCI: LM nl, LAD 100ost, 31m, 75d, LCX 60ost, OM2 80, RCA 100ost, RPDA 80, LIMA->LAD ok, VG->D1 patent stent, VG->RPDA patent stent, 50p, VG->OM1->OM2 90p (3.0x24 Synergy DES), 100 between OM1->OM2 (med rx).   Cancer [facility])    Right Shoulder, Left Leg- BCC, SCC, AND MELANOMA   Colon polyp    Elevated cholesterol    Epithelioid hemangioendothelioma    s/p resection of left SFA/mass with interposition 6 mm GoreTex graft 06/02/10, s/p repeat resection for positive margins 09/22/10   High blood pressure    Hyperlipidemia    Hypertension    Leg pain    OSA on CPAP    PAD (peripheral artery disease) (HCC)    a. 10/2002 L SFA PTA/BMS; b. 8/17 LE Angio: LEIA 90 (9x40 self exp stent), LSFA short segment prox occlusion (staged PTA/stenting 01/03/2016), patent mid stent, RSFA 43m (staged PTA/DEB 02/14/2016).   Presence of permanent cardiac pacemaker    Medtronic   Renal insufficiency 12/18/2022   SSS (sick sinus syndrome) (HCC)    a. s/p PPM in 2007 with gen change 04/2015 - Medtronic Adapta ADDRL1, ser # [id number].   Type II diabetes mellitus (HCC)    Type II   Urgency of urination     Past Surgical History:  Procedure Laterality Date   CARDIAC CATHETERIZATION N/A 02/01/2015   Procedure: Left Heart Cath and Coronary Angiography;  Surgeon: Runell Gess, MD;  Location: [facility] INVASIVE CV LAB;  Service: Cardiovascular;  Laterality: N/A;   CARDIAC CATHETERIZATION N/A 02/01/2015   Procedure: Coronary Stent Intervention;  Surgeon: Runell Gess, MD;  Location: [facility] INVASIVE CV LAB;  Service: Cardiovascular;  Laterality: N/A;   CARDIAC CATHETERIZATION  06/2002   "just before bypass OR"   CARDIAC CATHETERIZATION N/A 03/01/2015   Procedure: Coronary Stent Intervention;   Surgeon: Runell Gess, MD;  Location: [facility] INVASIVE CV LAB;  Service: Cardiovascular;  Laterality: N/A;   CARDIAC CATHETERIZATION  02/06/2018   COLONOSCOPY     CORONARY ANGIOPLASTY     CORONARY ARTERY BYPASS GRAFT  06/2002   x5, LIMA-LAD;VG- Diag; seq VG- ramus & OM branch; VG-PDA   CORONARY STENT INTERVENTION N/A 04/12/2017   Procedure: CORONARY STENT INTERVENTION;  Surgeon: Runell Gess, MD;  Location: [facility] INVASIVE CV LAB;  Service: Cardiovascular;  Laterality: N/A;   CORONARY STENT INTERVENTION N/A 02/08/2018   Procedure: CORONARY STENT INTERVENTION;  Surgeon: Corky Crafts, MD;  Location: [facility] INVASIVE CV LAB;  Service: Cardiovascular;  Laterality: N/A;   CORONARY STENT INTERVENTION N/A 11/12/2018   Procedure: CORONARY STENT INTERVENTION;  Surgeon: Iran Ouch, MD;  Location: [facility] INVASIVE CV LAB;  Service: Cardiovascular;  Laterality: N/A;   CORONARY STENT INTERVENTION N/A 06/22/2020   Procedure: CORONARY STENT INTERVENTION;  Surgeon: Corky Crafts, MD;  Location: [facility] INVASIVE CV LAB;  Service: Cardiovascular;  Laterality: N/A;   EP IMPLANTABLE DEVICE N/A 04/23/2015   Procedure: PPM Generator Changeout;  Surgeon: Thurmon Fair, MD;  Location: [facility] INVASIVE CV LAB;  Service: Cardiovascular;  Laterality: N/A;   FALSE ANEURYSM REPAIR Left 11/29/2018   Procedure: REPAIR FALSE ANEURYSM LEFT RADIAL ARTERY;  Surgeon: Larina Earthly, MD;  Location: [facility] OR;  Service: Vascular;  Laterality: Left;   FEMORAL ARTERY STENT Left ~ 2014   "taken out of my leg; couldn' catorgorize what kind so the put it under all 3"; cataroziepitheloid hemanioendotheliomau   FOOT FRACTURE SURGERY Left 1973   FRACTURE SURGERY     INSERT / REPLACE / REMOVE PACEMAKER  10/13/05   right side, medtronic Adapta   IR RADIOLOGIST EVAL & MGMT  02/02/2023   KNEE HARDWARE REMOVAL Right 1950's   "3-4 months after the insertion"   KNEE SURGERY Right 1950's   "broke my lower leg; had to put pin in my knee to keep lower leg  in place til it healed"   LEFT HEART CATH AND CORS/GRAFTS ANGIOGRAPHY N/A 04/12/2017   Procedure: LEFT HEART CATH AND CORS/GRAFTS ANGIOGRAPHY;  Surgeon: Runell Gess, MD;  Location: [facility] INVASIVE CV LAB;  Service: Cardiovascular;  Laterality: N/A;   LEFT HEART CATH AND CORS/GRAFTS ANGIOGRAPHY N/A 02/06/2018   Procedure: LEFT HEART CATH AND CORS/GRAFTS ANGIOGRAPHY;  Surgeon: Lennette Bihari, MD;  Location: [facility] INVASIVE CV LAB;  Service: Cardiovascular;  Laterality: N/A;   LEFT HEART CATH AND CORS/GRAFTS ANGIOGRAPHY N/A 11/12/2018   Procedure: LEFT HEART CATH AND CORS/GRAFTS ANGIOGRAPHY;  Surgeon: Iran Ouch, MD;  Location: [facility] INVASIVE CV LAB;  Service: Cardiovascular;  Laterality: N/A;   LEFT HEART CATH AND CORS/GRAFTS ANGIOGRAPHY N/A 06/22/2020   Procedure: LEFT HEART CATH AND CORS/GRAFTS ANGIOGRAPHY;  Surgeon: Corky Crafts, MD;  Location: [facility] INVASIVE CV LAB;  Service: Cardiovascular;  Laterality: N/A;   LEFT HEART CATH AND CORS/GRAFTS ANGIOGRAPHY N/A 12/01/2022   Procedure: LEFT HEART CATH AND CORS/GRAFTS ANGIOGRAPHY;  Surgeon: [place], Peter M, MD;  Location: [facility] INVASIVE CV LAB;  Service: Cardiovascular;  Laterality: N/A;   PERIPHERAL VASCULAR CATHETERIZATION N/A 11/25/2015  Procedure: Lower Extremity Angiography;  Surgeon: Runell Gess, MD;  Location: [facility] INVASIVE CV LAB;  Service: Cardiovascular;  Laterality: N/A;   PERIPHERAL VASCULAR CATHETERIZATION Left 11/25/2015   Procedure: Peripheral Vascular Intervention;  Surgeon: Runell Gess, MD;  Location: [facility] INVASIVE CV LAB;  Service: Cardiovascular;  Laterality: Left;  external iliac   PERIPHERAL VASCULAR CATHETERIZATION N/A 01/03/2016   Procedure: Lower Extremity Angiography;  Surgeon: Runell Gess, MD;  Location: [facility] INVASIVE CV LAB;  Service: Cardiovascular;  Laterality: N/A;   PERIPHERAL VASCULAR CATHETERIZATION Left 01/03/2016   Procedure: Peripheral Vascular Intervention;  Surgeon: Runell Gess, MD;  Location: [facility]  INVASIVE CV LAB;  Service: Cardiovascular;  Laterality: Left;  SFA   PERIPHERAL VASCULAR CATHETERIZATION Right 02/14/2016   Procedure: Peripheral Vascular Atherectomy;  Surgeon: Runell Gess, MD;  Location: [facility] INVASIVE CV LAB;  Service: Cardiovascular;  Laterality: Right;  SFA   POPLITEAL ARTERY STENT  01/03/2016   Contralateral access with a 7 [name] crossover sheath (second order catheter placement)   TONSILLECTOMY AND ADENOIDECTOMY     TUMOR EXCISION Right ~ 2005   cancerous tumor removed from shoulder   TUMOR EXCISION Right ~ 2000   benign tumor removed from under shoulder   TUMOR EXCISION Left 06/02/2010   resection of Lt SFA wth interposition of Gore-Tex graft    Allergies: Peanut-containing drug products, Ace inhibitors, Diltiazem hcl, Meloxicam, Doxycycline, Eliquis [apixaban], Sertraline hcl, Carvedilol, Clonidine hcl, and Duloxetine hcl  Medications: Prior to Admission medications   Medication Sig Start Date End Date Taking? Authorizing Provider  atorvastatin (LIPITOR) 80 MG tablet Take 1 tablet (80 mg total) by mouth daily at 6 PM. 02/09/18   Janetta Hora, PA-C  Cholecalciferol (VITAMIN D) 50 MCG (2000 UT) CAPS Take 1 capsule by mouth 2 (two) times daily.    [provider]  finasteride (PROSCAR) 5 MG tablet Take 5 mg by mouth daily.    [provider]  glipiZIDE (GLUCOTROL) 5 MG tablet Take 5 mg by mouth daily with breakfast.  01/10/18   [provider]  isosorbide mononitrate (IMDUR) 60 MG 24 hr tablet Take 60 mg by mouth daily.    [provider]  JARDIANCE 10 MG TABS tablet Take 10 mg by mouth daily. 01/12/23   [provider]  metFORMIN (GLUCOPHAGE) 1000 MG tablet Take 1 tablet (1,000 mg total) by mouth 2 (two) times daily with a meal. Restart on 3/18. 06/23/20   [facility], Roe Rutherford, PA  metoprolol tartrate (LOPRESSOR) 50 MG tablet Take 50 mg by mouth 2 (two) times daily.    [provider]  Multiple Vitamin-Folic  Acid TABS Take 1 tablet by mouth daily. 01/29/23   Runell Gess, MD  nitroGLYCERIN (NITROSTAT) 0.4 MG SL tablet Place 1 tablet (0.4 mg total) under the tongue every 5 (five) minutes as needed for chest pain. 11/21/22   Jodelle Gross, NP  ranolazine (RANEXA) 500 MG 12 hr tablet Take 500 mg by mouth 2 (two) times daily.    [provider]  rivaroxaban (XARELTO) 20 MG TABS tablet Take 1 tablet (20 mg total) by mouth daily with supper. 02/14/23   Runell Gess, MD  silodosin (RAPAFLO) 8 MG CAPS capsule Take 8 mg by mouth daily.    [provider]  tamsulosin (FLOMAX) 0.4 MG CAPS capsule Take 0.4 mg by mouth daily.    [provider]     Family History  Problem Relation Age of Onset   Coronary  artery disease Mother    Heart attack Mother    Hypertension Mother    Heart disease Mother        Open  Heart surgery   Diabetes Father    Heart disease Father    Hyperlipidemia Father    Heart attack Father    Hypertension Sister    Diabetes Sister    Heart disease Sister    Liver disease Neg Hx    Esophageal cancer Neg Hx    Colon cancer Neg Hx     Social History   Socioeconomic History   Marital status: Married    Spouse name: Not on file   Number of children: 2   Years of education: Not on file   Highest education level: Not on file  Occupational History   Occupation: retired  Tobacco Use   Smoking status: Former    Types: Pipe    Quit date: 1973    Years since quitting: 51.9   Smokeless tobacco: Never   Tobacco comments:    "quit smoking in 1973"  Vaping Use   Vaping status: Never Used  Substance and Sexual Activity   Alcohol use: No   Drug use: No   Sexual activity: Not Currently  Other Topics Concern   Not on file  Social History Narrative   Not on file   Social Determinants of Health   Financial Resource Strain: Not on file  Food Insecurity: No Food Insecurity (12/18/2022)   Hunger Vital Sign    Worried About Running Out of  Food in the Last Year: Never true    Ran Out of Food in the Last Year: Never true  Transportation Needs: No Transportation Needs (12/18/2022)   PRAPARE - Administrator, Civil Service (Medical): No    Lack of Transportation (Non-Medical): No  Physical Activity: Not on file  Stress: No Stress Concern Present (12/07/2022)   Received from [facility] - Occupational Stress Questionnaire    Feeling of Stress : Not at all  Social Connections: Unknown (08/18/2021)   [facility] from [facility]   Social Network    Social Network: Not on file    Review of Systems: A 12 point ROS discussed and pertinent positives are indicated in the HPI above.  All other systems are negative.  Review of Systems  Constitutional:  Negative for activity change, appetite change and fever.  Respiratory:  Positive for shortness of breath and wheezing. Negative for chest tightness.   Cardiovascular:  Negative for chest pain.  Skin:  Negative for color change.  Psychiatric/Behavioral:  Negative for behavioral problems and confusion.     Vital Signs: There were no vitals taken for this visit.  Advance Care Plan: The advanced care plan/surrogate decision maker was discussed at the time of visit and documented in the medical record.    Physical Exam Constitutional:      General: He is not in acute distress.    Appearance: Normal appearance.  HENT:     Mouth/Throat:     Mouth: Mucous membranes are moist.  Cardiovascular:     Rate and Rhythm: Normal rate and regular rhythm.     Heart sounds: No murmur heard. Pulmonary:     Effort: Pulmonary effort is normal. No respiratory distress.     Breath sounds: Normal breath sounds.  Musculoskeletal:        General: Normal range of motion.  Skin:    General:  Skin is warm and dry.  Neurological:     Mental Status: He is alert and oriented to person, place, and time.  Psychiatric:        Mood and  Affect: Mood normal.        Behavior: Behavior normal.        Thought Content: Thought content normal.        Judgment: Judgment normal.     Imaging: CUP PACEART REMOTE DEVICE CHECK  Result Date: 02/15/2023 Scheduled remote reviewed. Normal device function.  3 AHR episodes,  longest 3 min 55 sec, hx of PAF,  Xarelto per PA report Next remote 91 days. LA, CVRS.  IR Radiologist Eval & Mgmt  Result Date: 02/02/2023 EXAM: NEW PATIENT OFFICE VISIT CHIEF COMPLAINT: See below HISTORY OF PRESENT ILLNESS: See below REVIEW OF SYSTEMS: See below PHYSICAL EXAMINATION: See below ASSESSMENT AND PLAN: Please refer to completed note in the electronic medical record on [facility] Roanna Banning, MD [facility] Radiology Electronically Signed   By: Roanna Banning M.D.   On: 02/02/2023 10:56    Labs:  CBC: Recent Labs    12/22/22 1226 12/23/22 0524 01/02/23 1132 01/11/23 0950  WBC 14.6* 15.4* 13.6* 12.8*  HGB 8.3* 8.9* 10.4* 10.8*  HCT 27.5* 28.6* 34.6* 33.8*  PLT 140* 142* 159 136*    COAGS: Recent Labs    11/30/22 2303 12/01/22 0329 12/18/22 1128  INR  --   --  2.8*  APTT 52* 66* 39*    BMP: Recent Labs    12/22/22 1226 12/23/22 0524 01/02/23 1132 01/11/23 0950  NA 139 141 137 140  K 4.3 3.8 4.5 4.3  CL 110 111 108 109  CO2 22 21* 21* 24  GLUCOSE 241* 130* 115* 192*  BUN 19 19 24* 24*  CALCIUM 8.1* 8.3* 8.5* 9.4  CREATININE 1.14 1.08 0.93 1.12  GFRNONAA >60 >60 >60 >60    LIVER FUNCTION TESTS: Recent Labs    12/14/22 1100 12/18/22 1128 01/02/23 1132 01/11/23 0950  BILITOT 0.6 0.3 0.7 0.5  AST 12* 17 18 11*  ALT 14 15 14 10   ALKPHOS 63 51 51 61  PROT 6.4* 5.6* 5.7* 6.0*  ALBUMIN 3.9 3.1* 3.3* 3.9    TUMOR MARKERS: No results for input(s): "AFPTM", "CEA", "CA199", "CHROMGRNA" in the last 8760 hours.  Assessment and Plan:  Patient presents for bilateral prostate artery embolization in IR today for management  of refractory urinary retention.  ...   Thank you for this interesting consult.  I greatly enjoyed meeting Robert Burns and look forward to participating in their care.  A copy of this report was sent to the requesting provider on this date.  Electronically Signed: Sable Feil, PA-C 02/25/2023, 9:58 PM   I spent a total of    15 Minutes in face to face in clinical consultation, greater than 50% of which was counseling/coordinating care for bilateral prostate artery embolization

## 2023-02-26 ENCOUNTER — Ambulatory Visit (HOSPITAL_COMMUNITY)
Admission: RE | Admit: 2023-02-26 | Discharge: 2023-02-26 | Disposition: A | Discharge status: Home / Self Care | Payer: Medicare HMO | Source: Ambulatory Visit | Attending: Interventional Radiology | Admitting: Interventional Radiology

## 2023-02-26 ENCOUNTER — Other Ambulatory Visit (HOSPITAL_COMMUNITY): Payer: Self-pay

## 2023-02-26 ENCOUNTER — Encounter (HOSPITAL_COMMUNITY): Payer: Self-pay

## 2023-02-26 ENCOUNTER — Other Ambulatory Visit (HOSPITAL_COMMUNITY): Payer: Self-pay | Admitting: Interventional Radiology

## 2023-02-26 ENCOUNTER — Other Ambulatory Visit: Payer: Self-pay

## 2023-02-26 ENCOUNTER — Ambulatory Visit (HOSPITAL_COMMUNITY)
Admission: RE | Admit: 2023-02-26 | Discharge: 2023-02-26 | Disposition: A | Discharge status: Home / Self Care | Payer: Medicare HMO | Source: Ambulatory Visit | Attending: Interventional Radiology

## 2023-02-26 DIAGNOSIS — E1151 Type 2 diabetes mellitus with diabetic peripheral angiopathy without gangrene: Secondary | ICD-10-CM | POA: Diagnosis not present

## 2023-02-26 DIAGNOSIS — N401 Enlarged prostate with lower urinary tract symptoms: Secondary | ICD-10-CM | POA: Insufficient documentation

## 2023-02-26 DIAGNOSIS — I2510 Atherosclerotic heart disease of native coronary artery without angina pectoris: Secondary | ICD-10-CM | POA: Diagnosis not present

## 2023-02-26 DIAGNOSIS — I4891 Unspecified atrial fibrillation: Secondary | ICD-10-CM | POA: Diagnosis not present

## 2023-02-26 DIAGNOSIS — I708 Atherosclerosis of other arteries: Secondary | ICD-10-CM | POA: Diagnosis not present

## 2023-02-26 DIAGNOSIS — Z955 Presence of coronary angioplasty implant and graft: Secondary | ICD-10-CM | POA: Insufficient documentation

## 2023-02-26 DIAGNOSIS — Z7901 Long term (current) use of anticoagulants: Secondary | ICD-10-CM | POA: Insufficient documentation

## 2023-02-26 DIAGNOSIS — Z951 Presence of aortocoronary bypass graft: Secondary | ICD-10-CM | POA: Insufficient documentation

## 2023-02-26 DIAGNOSIS — N138 Other obstructive and reflux uropathy: Secondary | ICD-10-CM | POA: Insufficient documentation

## 2023-02-26 DIAGNOSIS — Z87891 Personal history of nicotine dependence: Secondary | ICD-10-CM | POA: Diagnosis not present

## 2023-02-26 DIAGNOSIS — Z950 Presence of cardiac pacemaker: Secondary | ICD-10-CM | POA: Insufficient documentation

## 2023-02-26 HISTORY — PX: IR EMBO TUMOR ORGAN ISCHEMIA INFARCT INC GUIDE ROADMAPPING: IMG5449

## 2023-02-26 HISTORY — PX: IR US GUIDE VASC ACCESS LEFT: IMG2389

## 2023-02-26 HISTORY — PX: IR ANGIOGRAM SELECTIVE EACH ADDITIONAL VESSEL: IMG667

## 2023-02-26 HISTORY — PX: IR ANGIOGRAM PELVIS SELECTIVE OR SUPRASELECTIVE: IMG661

## 2023-02-26 HISTORY — PX: IR 3D INDEPENDENT WKST: IMG2385

## 2023-02-26 LAB — CBC WITH DIFFERENTIAL/PLATELET
Abs Immature Granulocytes: 0.02 10*3/uL (ref 0.00–0.07)
Basophils Absolute: 0 10*3/uL (ref 0.0–0.1)
Basophils Relative: 0 %
Eosinophils Absolute: 0.1 10*3/uL (ref 0.0–0.5)
Eosinophils Relative: 1 %
HCT: 38.3 % — ABNORMAL LOW (ref 39.0–52.0)
Hemoglobin: 12 g/dL — ABNORMAL LOW (ref 13.0–17.0)
Immature Granulocytes: 0 %
Lymphocytes Relative: 65 %
Lymphs Abs: 8.5 10*3/uL — ABNORMAL HIGH (ref 0.7–4.0)
MCH: 26.7 pg (ref 26.0–34.0)
MCHC: 31.3 g/dL (ref 30.0–36.0)
MCV: 85.3 fL (ref 80.0–100.0)
Monocytes Absolute: 0.7 10*3/uL (ref 0.1–1.0)
Monocytes Relative: 5 %
Neutro Abs: 3.9 10*3/uL (ref 1.7–7.7)
Neutrophils Relative %: 29 %
Platelets: 139 10*3/uL — ABNORMAL LOW (ref 150–400)
RBC: 4.49 MIL/uL (ref 4.22–5.81)
RDW: 17.1 % — ABNORMAL HIGH (ref 11.5–15.5)
WBC Morphology: ABNORMAL
WBC: 13.3 10*3/uL — ABNORMAL HIGH (ref 4.0–10.5)
nRBC: 0 % (ref 0.0–0.2)

## 2023-02-26 LAB — COMPREHENSIVE METABOLIC PANEL
ALT: 25 U/L (ref 0–44)
AST: 22 U/L (ref 15–41)
Albumin: 4.1 g/dL (ref 3.5–5.0)
Alkaline Phosphatase: 54 U/L (ref 38–126)
Anion gap: 8 (ref 5–15)
BUN: 28 mg/dL — ABNORMAL HIGH (ref 8–23)
CO2: 26 mmol/L (ref 22–32)
Calcium: 9.3 mg/dL (ref 8.9–10.3)
Chloride: 107 mmol/L (ref 98–111)
Creatinine, Ser: 0.83 mg/dL (ref 0.61–1.24)
GFR, Estimated: >60 mL/min (ref >60)
Glucose, Bld: 79 mg/dL (ref 70–99)
Potassium: 4.4 mmol/L (ref 3.5–5.1)
Sodium: 141 mmol/L (ref 135–145)
Total Bilirubin: 0.8 mg/dL (ref <1.2)
Total Protein: 6.8 g/dL (ref 6.5–8.1)

## 2023-02-26 LAB — GLUCOSE, CAPILLARY: Glucose-Capillary: 78 mg/dL (ref 70–99)

## 2023-02-26 LAB — PSA: Prostatic Specific Antigen: 3.11 ng/mL (ref 0.00–4.00)

## 2023-02-26 LAB — PROTIME-INR
INR: 2.1 — ABNORMAL HIGH (ref 0.8–1.2)
Prothrombin Time: 23.9 s — ABNORMAL HIGH (ref 11.4–15.2)

## 2023-02-26 MED ORDER — NAPROXEN 250 MG PO TABS
500.0000 mg | ORAL_TABLET | Freq: Once | ORAL | Status: AC
  Administered 2023-02-26: 500 mg via ORAL
  Filled 2023-02-26: qty 2

## 2023-02-26 MED ORDER — MIDAZOLAM HCL 2 MG/2ML IJ SOLN
INTRAMUSCULAR | Status: AC | PRN
  Administered 2023-02-26 (×3): .5 mg via INTRAVENOUS
  Administered 2023-02-26: 1 mg via INTRAVENOUS

## 2023-02-26 MED ORDER — FENTANYL CITRATE (PF) 100 MCG/2ML IJ SOLN
INTRAMUSCULAR | Status: AC | PRN
  Administered 2023-02-26: 50 ug via INTRAVENOUS

## 2023-02-26 MED ORDER — NITROGLYCERIN 1 MG/10 ML FOR IR/CATH LAB
INTRA_ARTERIAL | Status: AC | PRN
  Administered 2023-02-26: 200 ug via INTRA_ARTERIAL

## 2023-02-26 MED ORDER — VERAPAMIL HCL 2.5 MG/ML IV SOLN
INTRAVENOUS | Status: AC | PRN
  Administered 2023-02-26: 2.5 mg via INTRA_ARTERIAL

## 2023-02-26 MED ORDER — IOHEXOL 300 MG/ML  SOLN
100.0000 mL | Freq: Once | INTRAMUSCULAR | Status: AC | PRN
  Administered 2023-02-26: 30 mL via INTRA_ARTERIAL

## 2023-02-26 MED ORDER — LIDOCAINE HCL (PF) 1 % IJ SOLN
10.0000 mL | Freq: Once | INTRAMUSCULAR | Status: AC
  Administered 2023-02-26: 2 mL via INTRADERMAL

## 2023-02-26 MED ORDER — SOLIFENACIN SUCCINATE 5 MG PO TABS
5.0000 mg | ORAL_TABLET | Freq: Every day | ORAL | 0 refills | Status: DC
  Filled 2023-02-26: qty 7, 7d supply, fill #0

## 2023-02-26 MED ORDER — SODIUM CHLORIDE 0.9 % IV SOLN: INTRAVENOUS | Status: DC

## 2023-02-26 MED ORDER — CIPROFLOXACIN HCL 500 MG PO TABS
500.0000 mg | ORAL_TABLET | Freq: Two times a day (BID) | ORAL | 0 refills | Status: DC
  Filled 2023-02-26: qty 14, 7d supply, fill #0

## 2023-02-26 MED ORDER — SODIUM CHLORIDE 0.9% FLUSH: 3.0000 mL | Freq: Two times a day (BID) | INTRAVENOUS | Status: DC

## 2023-02-26 MED ORDER — FENTANYL CITRATE (PF) 100 MCG/2ML IJ SOLN
INTRAMUSCULAR | Status: AC
  Filled 2023-02-26: qty 2

## 2023-02-26 MED ORDER — DEXAMETHASONE SODIUM PHOSPHATE 10 MG/ML IJ SOLN
8.0000 mg | Freq: Once | INTRAMUSCULAR | Status: AC
  Administered 2023-02-26: 8 mg via INTRAVENOUS
  Filled 2023-02-26: qty 1

## 2023-02-26 MED ORDER — LIDOCAINE HCL (PF) 1 % IJ SOLN
INTRAMUSCULAR | Status: AC
  Filled 2023-02-26: qty 30

## 2023-02-26 MED ORDER — IOHEXOL 300 MG/ML  SOLN
50.0000 mL | Freq: Once | INTRAMUSCULAR | Status: AC | PRN
  Administered 2023-02-26: 25 mL via INTRA_ARTERIAL

## 2023-02-26 MED ORDER — MIDAZOLAM HCL 2 MG/2ML IJ SOLN
INTRAMUSCULAR | Status: AC
Start: 2023-02-26 — End: ?
  Filled 2023-02-26: qty 2

## 2023-02-26 MED ORDER — PHENAZOPYRIDINE HCL 100 MG PO TABS
100.0000 mg | ORAL_TABLET | Freq: Three times a day (TID) | ORAL | 0 refills | Status: DC | PRN
  Filled 2023-02-26: qty 11, 4d supply, fill #0

## 2023-02-26 MED ORDER — SODIUM CHLORIDE 0.9% FLUSH: 3.0000 mL | INTRAVENOUS | Status: DC | PRN

## 2023-02-26 MED ORDER — VERAPAMIL HCL 2.5 MG/ML IV SOLN
INTRAVENOUS | Status: AC
  Filled 2023-02-26: qty 2

## 2023-02-26 MED ORDER — MIDAZOLAM HCL 2 MG/2ML IJ SOLN
INTRAMUSCULAR | Status: AC
  Filled 2023-02-26: qty 2

## 2023-02-26 MED ORDER — IOHEXOL 300 MG/ML  SOLN
100.0000 mL | Freq: Once | INTRAMUSCULAR | Status: AC | PRN
  Administered 2023-02-26: 25 mL via INTRA_ARTERIAL

## 2023-02-26 MED ORDER — NITROGLYCERIN IN D5W 100-5 MCG/ML-% IV SOLN
INTRAVENOUS | Status: AC
  Filled 2023-02-26: qty 250

## 2023-02-26 MED ORDER — IOHEXOL 300 MG/ML  SOLN
100.0000 mL | Freq: Once | INTRAMUSCULAR | Status: AC | PRN
  Administered 2023-02-26: 50 mL via INTRA_ARTERIAL

## 2023-02-26 MED ORDER — SODIUM CHLORIDE 0.9 % IV SOLN
250.0000 mL | INTRAVENOUS | Status: DC | PRN
Start: 2023-02-26 — End: 2023-02-27

## 2023-02-26 MED ORDER — HEPARIN SODIUM (PORCINE) 1000 UNIT/ML IJ SOLN
INTRAMUSCULAR | Status: AC | PRN
  Administered 2023-02-26: 3000 [IU] via INTRA_ARTERIAL

## 2023-02-26 MED ORDER — SULFAMETHOXAZOLE-TRIMETHOPRIM 400-80 MG PO TABS
1.0000 | ORAL_TABLET | Freq: Once | ORAL | Status: AC
  Administered 2023-02-26: 1 via ORAL
  Filled 2023-02-26: qty 1

## 2023-02-26 MED ORDER — HEPARIN SODIUM (PORCINE) 1000 UNIT/ML IJ SOLN
INTRAMUSCULAR | Status: AC
  Filled 2023-02-26: qty 10

## 2023-02-26 MED ORDER — FENTANYL CITRATE (PF) 100 MCG/2ML IJ SOLN
INTRAMUSCULAR | Status: AC | PRN
  Administered 2023-02-26 (×5): 25 ug via INTRAVENOUS

## 2023-02-26 NOTE — Discharge Instructions (Signed)
Please call Interventional Radiology clinic [phone] with any questions or concerns.  You may remove your dressing and shower tomorrow.  After the procedure, it is common to have: Increased frequency of urination Mild pressure or discomfort in the low belly Blood in the urine and/or painful urination for 7-14 days  Blood in the semen for 7-21 days Pressure, pain, or the urge to have a bowel movement when urinating and/or a small amount of blood in the stool for 4-7 days Low-grade fever (temperature less than 101.5 degrees Fahrenheit or 38.6 degrees Celsius) for 4-7 days Tenderness and/or bruising around the puncture site. Bruising can take 2-3 weeks to go away completely. Please call immediately if you feel a lump that is growing or varies with your pulse  Follow these instructions at home:  Medication: Do not use Aspirin or ibuprofen products, such as Advil or Motrin, as it may increase bleeding You may resume your usual medications as ordered by your doctor If your doctor prescribed antibiotics, take them as directed. Do not stop taking them just because you feel better  Eating and drinking: Drink plenty of liquids to keep your urine pale yellow You can resume your regular diet as directed by your doctor   Activity For procedures where we entered your artery from the top of your leg (groin):  Avoid strenuous activity, such as climbing long flights of stairs, for 24 hours after your procedure  No heavy lifting (greater than 15-20 pounds or 6-9 kg) for 7 days or until the puncture site heels Do not take baths, swim, or use a hot tub until your health care provider approves. Take showers only Keep all follow-up visits as told by your doctor  Care of the procedure site Follow instructions from your health care provider about how to take care of the puncture site.  Wash your hands with soap and water before you change your bandage (dressing). If soap and water are not available, use  hand sanitizer Change your dressing as told by your health care provider Leave stitches (sutures), skin glue, or adhesive strips in place. These skin closures may need to stay in place for 2 weeks or longer. If adhesive strip edges start to loosen and curl up, you may trim the loose edges. Do not remove adhesive strips completely unless your health care provider tells you to do that Check your puncture site every day for signs of infection. Check for: More redness, swelling, or pain Warmth/heat at puncture site Pus or a bad odor  Contact a health care provider if: You have a fever You have more redness, swelling, or pain around your incision You have more fluid or blood coming from your incision site Your incision feels warm to the touch You have pus or a bad smell coming from your incision You have a rash You have nausea, or you cannot eat or drink anything without vomiting  Get help right away if: You have trouble breathing You have chest pain You have severe pain in your abdomen, and it does not get better with medicine You have leg pain or leg swelling You feel dizzy, or you faint   Moderate Conscious Sedation-Care After  This sheet gives you information about how to care for yourself after your procedure. Your health care provider may also give you more specific instructions. If you have problems or questions, contact your health care provider.  After the procedure, it is common to have: Sleepiness for several hours. Impaired judgment for several hours.  Difficulty with balance. Vomiting if you eat too soon.  Follow these instructions at home:  Rest. Do not participate in activities where you could fall or become injured. Do not drive or use machinery. Do not drink alcohol. Do not take sleeping pills or medicines that cause drowsiness. Do not make important decisions or sign legal documents. Do not take care of children on your own.  Eating and drinking Follow the  diet recommended by your health care provider. Drink enough fluid to keep your urine pale yellow. If you vomit: Drink water, juice, or soup when you can drink without vomiting. Make sure you have little or no nausea before eating solid foods.  General instructions Take over-the-counter and prescription medicines only as told by your health care provider. Have a responsible adult stay with you for the time you are told. It is important to have someone help care for you until you are awake and alert. Do not smoke. Keep all follow-up visits as told by your health care provider. This is important.  Contact a health care provider if: You are still sleepy or having trouble with balance after 24 hours. You feel light-headed. You keep feeling nauseous or you keep vomiting. You develop a rash. You have a fever. You have redness or swelling around the IV site.  Get help right away if: You have trouble breathing. You have new-onset confusion at home.  This information is not intended to replace advice given to you by your health care provider. Make sure you discuss any questions you have with your healthcare provider.

## 2023-02-26 NOTE — Procedures (Signed)
Vascular and Interventional Radiology Procedure Note  Patient: Robert Burns DOB: [birth year] Medical Record Number: [id number] Note Date/Time: 02/26/23 10:55 AM   Performing Physician: Roanna Banning, MD Assistant(s): None  Diagnosis: BPH w LUTS. Foley catheter dependence.   Procedure(s):  PELVIC ARTERIOGRAPHY *Aborted* PROSTATE ARTERY EMBOLIZATION   Anesthesia: Conscious Sedation Complications: None Estimated Blood Loss: Minimal Specimens: None  Findings:  - access via the LEFT radial artery. - Significant stenosis at the L hypogastric artery and internal pudendal artery origin, feeding the L prostatic artery. - Stenosed L prostatic artery origin. - Unsuccessful prostatic artery access bilaterally. Aborted embolization - TR band closure at L wrist at the end of the case  Plan: - Standard post radial access deflation protocol.   Final report to follow once all images are reviewed and compared with previous studies.  See detailed dictation with images in PACS. The patient tolerated the procedure well without incident or complication and was returned to Recovery in stable condition.    Roanna Banning, MD [facility] Inter[facility]iology [facility] Radiology   Pager. [phone] Clinic. [phone]

## 2023-03-13 NOTE — Progress Notes (Signed)
Remote pacemaker transmission.   

## 2023-03-22 DIAGNOSIS — H6123 Impacted cerumen, bilateral: Secondary | ICD-10-CM | POA: Diagnosis not present

## 2023-03-22 DIAGNOSIS — H903 Sensorineural hearing loss, bilateral: Secondary | ICD-10-CM | POA: Diagnosis not present

## 2023-03-23 DIAGNOSIS — R338 Other retention of urine: Secondary | ICD-10-CM | POA: Diagnosis not present

## 2023-03-23 DIAGNOSIS — R310 Gross hematuria: Secondary | ICD-10-CM | POA: Diagnosis not present

## 2023-03-23 DIAGNOSIS — N401 Enlarged prostate with lower urinary tract symptoms: Secondary | ICD-10-CM | POA: Diagnosis not present

## 2023-03-23 DIAGNOSIS — D6869 Other thrombophilia: Secondary | ICD-10-CM | POA: Diagnosis not present

## 2023-03-23 DIAGNOSIS — I4891 Unspecified atrial fibrillation: Secondary | ICD-10-CM | POA: Diagnosis not present

## 2023-03-23 DIAGNOSIS — I10 Essential (primary) hypertension: Secondary | ICD-10-CM | POA: Diagnosis not present

## 2023-03-23 DIAGNOSIS — E1142 Type 2 diabetes mellitus with diabetic polyneuropathy: Secondary | ICD-10-CM | POA: Diagnosis not present

## 2023-04-02 DIAGNOSIS — R338 Other retention of urine: Secondary | ICD-10-CM | POA: Diagnosis not present

## 2023-04-02 NOTE — Addendum Note (Signed)
Encounter addended by: Arby Barrette on: 04/02/2023 1:19 PM  Actions taken: Imaging Exam ended

## 2023-04-05 DIAGNOSIS — R310 Gross hematuria: Secondary | ICD-10-CM | POA: Diagnosis not present

## 2023-04-05 DIAGNOSIS — N401 Enlarged prostate with lower urinary tract symptoms: Secondary | ICD-10-CM | POA: Diagnosis not present

## 2023-04-12 NOTE — Progress Notes (Signed)
 [facility] [phone]   [phone]  PROGRESS NOTE  Patient Care Team: Dwight Trula SQUIBB, MD as PCP - General (Internal Medicine) Francyne Headland, MD as PCP - Electrophysiology (Cardiology) Court Dorn PARAS, MD as PCP - Cardiology (Cardiology) Court Dorn PARAS, MD as Consulting Physician (Cardiology) Duke, Jon Garre, PA as Physician Assistant (Cardiology)  Hematological/Oncological History # CLL Robert Stage 0. Del 13 # Iron  Deficiency Anemia  1) 08/09/2021: Labs from PCP, Dr. Norleen Lewis: --WBC 18.6, Hgb 11.7, MCV 80.4, Plt 164, Lymph 12.40.  2)  08/24/2021: Establish care with [facility] (Robert Burns) Hematology.  Flow cytometry results most consistent with CLL  Interval History:  Robert Burns 83 y.o. male with medical history significant for newly diagnosed CLL who presents for a follow up visit. The patient'Burns last visit was on 01/11/2023. In the interim since the last visit he has had no major changes in his health.  On exam today Robert Burns reports he was scheduled for a prostate artery embolization but due to difficulties during the procedure this had to be canceled.  This was performed because he was having issues with blood in his urine, but reports he has not had any subsequent blood in his urine since that attempted procedure.  He notes that he is currently being considered for a TURP with urology but he would need cardiology clearance (which his family states would be unlikely).  He notes that he has continued low energy with his energy being about 2 or 3 out of 10.  His appetite however is quite good and he has been keeping a steady weight.  He reports that he is trying to watch what he eats and avoiding added salt.  Otherwise he has no questions concerns or complaints today.  He otherwise denies any fevers, chills, sweats, nausea, vomiting or diarrhea.  Full 10 point ROS is listed below.   MEDICAL HISTORY:  Past Medical History:  Diagnosis Date   Anemia     Anxiety    Arthritis    all over (11/25/2015)   CAD (coronary artery disease)    a. Burns/Burns CABG  06/2002; Burns. 02/01/15 PCI: DES to prox SVG to PDA, staged PCI of SVG to Diag in 02/2015; c. 04/2017 Cath/PCI: LM nl, LAD 100ost, 3m, 75d, LCX 60ost, OM2 80, RCA 100ost, RPDA 80, LIMA->LAD ok, VG->D1 patent stent, VG->RPDA patent stent, 50p, VG->OM1->OM2 90p (3.0x24 Synergy DES), 100 between OM1->OM2 (med rx).   [facility])    Right Shoulder, Left Leg- BCC, SCC, AND MELANOMA   Colon polyp    Elevated cholesterol    Epithelioid hemangioendothelioma    Burns/Burns resection of left SFA/mass with interposition 6 mm GoreTex graft 06/02/10, Burns/Burns repeat resection for positive margins 09/22/10   High blood pressure    Hyperlipidemia    Hypertension    Leg pain    OSA on CPAP    PAD (peripheral artery disease) (HCC)    a. 10/2002 L SFA PTA/BMS; Burns. 8/17 LE Angio: LEIA 90 (9x40 self exp stent), LSFA short segment prox occlusion (staged PTA/stenting 01/03/2016), patent mid stent, RSFA 73m (staged PTA/DEB 02/14/2016).   Presence of permanent cardiac pacemaker    Medtronic   Renal insufficiency 12/18/2022   SSS (sick sinus syndrome) (HCC)    a. Burns/Burns PPM in 2007 with gen change 04/2015 - Medtronic Adapta ADDRL1, ser # [id number] Burns.   Type II diabetes mellitus (HCC)    Type II   Urgency of urination     SURGICAL  HISTORY: Past Surgical History:  Procedure Laterality Date   CARDIAC CATHETERIZATION Burns/A 02/01/2015   Procedure: Left Heart Cath and Coronary Angiography;  Surgeon: Dorn JINNY Lesches, MD;  Location: [facility] INVASIVE CV LAB;  Service: Cardiovascular;  Laterality: Burns/A;   CARDIAC CATHETERIZATION Burns/A 02/01/2015   Procedure: Coronary Stent Intervention;  Surgeon: Dorn JINNY Lesches, MD;  Location: [facility] INVASIVE CV LAB;  Service: Cardiovascular;  Laterality: Burns/A;   CARDIAC CATHETERIZATION  06/2002   just before bypass OR   CARDIAC CATHETERIZATION Burns/A 03/01/2015   Procedure: Coronary Stent Intervention;  Surgeon: Dorn JINNY Lesches, MD;  Location: [facility] INVASIVE CV LAB;  Service: Cardiovascular;  Laterality: Burns/A;   CARDIAC CATHETERIZATION  02/06/2018   COLONOSCOPY     CORONARY ANGIOPLASTY     CORONARY ARTERY BYPASS GRAFT  06/2002   x5, LIMA-LAD;VG- Diag; seq VG- ramus & OM branch; VG-PDA   CORONARY STENT INTERVENTION Burns/A 04/12/2017   Procedure: CORONARY STENT INTERVENTION;  Surgeon: Lesches Dorn JINNY, MD;  Location: [facility] INVASIVE CV LAB;  Service: Cardiovascular;  Laterality: Burns/A;   CORONARY STENT INTERVENTION Burns/A 02/08/2018   Procedure: CORONARY STENT INTERVENTION;  Surgeon: Dann Candyce RAMAN, MD;  Location: [facility] INVASIVE CV LAB;  Service: Cardiovascular;  Laterality: Burns/A;   CORONARY STENT INTERVENTION Burns/A 11/12/2018   Procedure: CORONARY STENT INTERVENTION;  Surgeon: Darron Deatrice LABOR, MD;  Location: [facility] INVASIVE CV LAB;  Service: Cardiovascular;  Laterality: Burns/A;   CORONARY STENT INTERVENTION Burns/A 06/22/2020   Procedure: CORONARY STENT INTERVENTION;  Surgeon: Dann Candyce RAMAN, MD;  Location: [facility] INVASIVE CV LAB;  Service: Cardiovascular;  Laterality: Burns/A;   EP IMPLANTABLE DEVICE Burns/A 04/23/2015   Procedure: PPM Generator Changeout;  Surgeon: Jerel Balding, MD;  Location: [facility] INVASIVE CV LAB;  Service: Cardiovascular;  Laterality: Burns/A;   FALSE ANEURYSM REPAIR Left 11/29/2018   Procedure: REPAIR FALSE ANEURYSM LEFT RADIAL ARTERY;  Surgeon: Oris Krystal FALCON, MD;  Location: [facility] OR;  Service: Vascular;  Laterality: Left;   FEMORAL ARTERY STENT Left ~ 2014   taken out of my leg; couldn' catorgorize what kind so the put it under all 3; cataroziepitheloid hemanioendotheliomau   FOOT FRACTURE SURGERY Left 1973   FRACTURE SURGERY     INSERT / REPLACE / REMOVE PACEMAKER  10/13/05   right side, medtronic Adapta   IR 3D INDEPENDENT WKST  02/26/2023   IR ANGIOGRAM PELVIS SELECTIVE OR SUPRASELECTIVE  02/26/2023   IR ANGIOGRAM SELECTIVE EACH ADDITIONAL VESSEL  02/26/2023   IR ANGIOGRAM SELECTIVE EACH ADDITIONAL VESSEL  02/26/2023   IR EMBO  TUMOR ORGAN ISCHEMIA INFARCT INC GUIDE ROADMAPPING  02/26/2023   IR RADIOLOGIST EVAL & MGMT  02/02/2023   IR US  GUIDE VASC ACCESS LEFT  02/26/2023   IR US  GUIDE VASC ACCESS LEFT  02/26/2023   IR US  GUIDE VASC ACCESS LEFT  02/26/2023   KNEE HARDWARE REMOVAL Right 1950'Burns   3-4 months after the insertion   KNEE SURGERY Right 1950'Burns   broke my lower leg; had to put pin in my knee to keep lower leg in place til it healed   LEFT HEART CATH AND CORS/GRAFTS ANGIOGRAPHY Burns/A 04/12/2017   Procedure: LEFT HEART CATH AND CORS/GRAFTS ANGIOGRAPHY;  Surgeon: Lesches Dorn JINNY, MD;  Location: [facility] INVASIVE CV LAB;  Service: Cardiovascular;  Laterality: Burns/A;   LEFT HEART CATH AND CORS/GRAFTS ANGIOGRAPHY Burns/A 02/06/2018   Procedure: LEFT HEART CATH AND CORS/GRAFTS ANGIOGRAPHY;  Surgeon: Burnard Debby LABOR, MD;  Location: [facility] INVASIVE CV LAB;  Service: Cardiovascular;  Laterality:  Burns/A;   LEFT HEART CATH AND CORS/GRAFTS ANGIOGRAPHY Burns/A 11/12/2018   Procedure: LEFT HEART CATH AND CORS/GRAFTS ANGIOGRAPHY;  Surgeon: Darron Deatrice LABOR, MD;  Location: [facility] INVASIVE CV LAB;  Service: Cardiovascular;  Laterality: Burns/A;   LEFT HEART CATH AND CORS/GRAFTS ANGIOGRAPHY Burns/A 06/22/2020   Procedure: LEFT HEART CATH AND CORS/GRAFTS ANGIOGRAPHY;  Surgeon: Dann Candyce RAMAN, MD;  Location: [facility] INVASIVE CV LAB;  Service: Cardiovascular;  Laterality: Burns/A;   LEFT HEART CATH AND CORS/GRAFTS ANGIOGRAPHY Burns/A 12/01/2022   Procedure: LEFT HEART CATH AND CORS/GRAFTS ANGIOGRAPHY;  Surgeon: Jordan, Peter M, MD;  Location: [facility] INVASIVE CV LAB;  Service: Cardiovascular;  Laterality: Burns/A;   PERIPHERAL VASCULAR CATHETERIZATION Burns/A 11/25/2015   Procedure: Lower Extremity Angiography;  Surgeon: Dorn JINNY Lesches, MD;  Location: [facility] INVASIVE CV LAB;  Service: Cardiovascular;  Laterality: Burns/A;   PERIPHERAL VASCULAR CATHETERIZATION Left 11/25/2015   Procedure: Peripheral Vascular Intervention;  Surgeon: Dorn JINNY Lesches, MD;  Location: [facility] INVASIVE CV LAB;  Service:  Cardiovascular;  Laterality: Left;  external iliac   PERIPHERAL VASCULAR CATHETERIZATION Burns/A 01/03/2016   Procedure: Lower Extremity Angiography;  Surgeon: Dorn JINNY Lesches, MD;  Location: [facility] INVASIVE CV LAB;  Service: Cardiovascular;  Laterality: Burns/A;   PERIPHERAL VASCULAR CATHETERIZATION Left 01/03/2016   Procedure: Peripheral Vascular Intervention;  Surgeon: Dorn JINNY Lesches, MD;  Location: [facility] INVASIVE CV LAB;  Service: Cardiovascular;  Laterality: Left;  SFA   PERIPHERAL VASCULAR CATHETERIZATION Right 02/14/2016   Procedure: Peripheral Vascular Atherectomy;  Surgeon: Dorn JINNY Lesches, MD;  Location: [facility] INVASIVE CV LAB;  Service: Cardiovascular;  Laterality: Right;  SFA   POPLITEAL ARTERY STENT  01/03/2016   Contralateral access with a 7 French crossover sheath (second order catheter placement)   TONSILLECTOMY AND ADENOIDECTOMY     TUMOR EXCISION Right ~ 2005   cancerous tumor removed from shoulder   TUMOR EXCISION Right ~ 2000   benign tumor removed from under shoulder   TUMOR EXCISION Left 06/02/2010   resection of Lt SFA wth interposition of Gore-Tex graft    SOCIAL HISTORY: Social History   Socioeconomic History   Marital status: Married    Spouse name: Not on file   Number of children: 2   Years of education: Not on file   Highest education level: Not on file  Occupational History   Occupation: retired  Tobacco Use   Smoking status: Former    Types: Pipe    Quit date: 1973    Years since quitting: 52.0   Smokeless tobacco: Never   Tobacco comments:    quit smoking in 1973  Vaping Use   Vaping status: Never Used  Substance and Sexual Activity   Alcohol use: No   Drug use: No   Sexual activity: Not Currently  Other Topics Concern   Not on file  Social History Narrative   Not on file   Social Drivers of Health   Financial Resource Strain: Not on file  Food Insecurity: No Food Insecurity (12/18/2022)   Hunger Vital Sign    Worried About Running Out of Food in the  Last Year: Never true    Ran Out of Food in the Last Year: Never true  Transportation Needs: No Transportation Needs (12/18/2022)   PRAPARE - Administrator, Civil Service (Medical): No    Lack of Transportation (Non-Medical): No  Physical Activity: Not on file  Stress: No Stress Concern Present (12/07/2022)   Received from [facility] - Occupational Stress Questionnaire    Feeling of Stress : Not at all  Social Connections: Unknown (08/18/2021)   Received from [facility]   Social Network    Social Network: Not on file  Intimate Partner Violence: Not At Risk (02/04/2023)   Received from [facility]   HITS    Over the last 12 months how often did your partner physically hurt you?: Never    Over the last 12 months how often did your partner insult you or talk down to you?: Never    Over the last 12 months how often did your partner threaten you with physical harm?: Never    Over the last 12 months how often did your partner scream or curse at you?: Never    FAMILY HISTORY: Family History  Problem Relation Age of Onset   Coronary artery disease Mother    Heart attack Mother    Hypertension Mother    Heart disease Mother        Open  Heart surgery   Diabetes Father    Heart disease Father    Hyperlipidemia Father    Heart attack Father    Hypertension Sister    Diabetes Sister    Heart disease Sister    Liver disease Neg Hx    Esophageal cancer Neg Hx    Colon cancer Neg Hx     ALLERGIES:  is allergic to peanut-containing drug products, ace inhibitors, diltiazem hcl, meloxicam, doxycycline, eliquis  [apixaban ], sertraline  hcl, carvedilol, clonidine  hcl, and duloxetine hcl.  MEDICATIONS:  Current Outpatient Medications  Medication Sig Dispense Refill   atorvastatin  (LIPITOR ) 80 MG tablet Take 1 tablet (80 mg total) by mouth daily at 6 PM. 90 tablet 1   Cholecalciferol  (VITAMIN Burns ) 50 MCG (2000 UT) CAPS  Take 1 capsule by mouth 2 (two) times daily.     finasteride  (PROSCAR ) 5 MG tablet Take 5 mg by mouth daily.     glipiZIDE  (GLUCOTROL ) 5 MG tablet Take 5 mg by mouth daily with breakfast.      isosorbide  mononitrate (IMDUR ) 60 MG 24 hr tablet Take 60 mg by mouth daily.     JARDIANCE  10 MG TABS tablet Take 10 mg by mouth daily.     metFORMIN  (GLUCOPHAGE ) 1000 MG tablet Take 1 tablet (1,000 mg total) by mouth 2 (two) times daily with a meal. Restart on 3/18.     metoprolol  tartrate (LOPRESSOR ) 50 MG tablet Take 50 mg by mouth 2 (two) times daily.     Multiple Vitamin-Folic Acid  TABS Take 1 tablet by mouth daily.     nitroGLYCERIN  (NITROSTAT ) 0.4 MG SL tablet Place 1 tablet (0.4 mg total) under the tongue every 5 (five) minutes as needed for chest pain. 25 tablet 3   ranolazine  (RANEXA ) 500 MG 12 hr tablet Take 500 mg by mouth 2 (two) times daily.     rivaroxaban  (XARELTO ) 20 MG TABS tablet Take 1 tablet (20 mg total) by mouth daily with supper. 90 tablet 0   silodosin (RAPAFLO) 8 MG CAPS capsule Take 8 mg by mouth daily.     tamsulosin  (FLOMAX ) 0.4 MG CAPS capsule Take 0.4 mg by mouth daily.     No current facility-administered medications for this visit.    REVIEW OF SYSTEMS:   Constitutional: ( - ) fevers, ( - )  chills , ( - ) night sweats Eyes: ( - ) blurriness of vision, ( - ) double vision, ( - ) watery eyes  Ears, nose, mouth, throat, and face: ( - ) mucositis, ( - ) sore throat Respiratory: ( - ) cough, ( - ) dyspnea, ( - ) wheezes Cardiovascular: ( - ) palpitation, ( - ) chest discomfort, ( - ) lower extremity swelling Gastrointestinal:  ( - ) nausea, ( - ) heartburn, ( - ) change in bowel habits Skin: ( - ) abnormal skin rashes Lymphatics: ( - ) new lymphadenopathy, ( - ) easy bruising Neurological: ( - ) numbness, ( - ) tingling, ( - ) new weaknesses Behavioral/Psych: ( - ) mood change, ( - ) new changes  All other systems were reviewed with the patient and are  negative.  PHYSICAL EXAMINATION: ECOG PERFORMANCE STATUS: 0 - Asymptomatic  There were no vitals filed for this visit.     There were no vitals filed for this visit.      GENERAL: Well-appearing elderly Caucasian male, alert, no distress and comfortable SKIN: skin color, texture, turgor are normal, no rashes or significant lesions EYES: conjunctiva are pink and non-injected, sclera clear NECK: supple, non-tender LYMPH:  no palpable lymphadenopathy in the cervical, axillary or inguinal LUNGS: clear to auscultation and percussion with normal breathing effort HEART: regular rate & rhythm and no murmurs and no lower extremity edema ABDOMEN: No evidence of hepatosplenomegaly.  Soft, non-tender, non-distended, normal bowel sounds Musculoskeletal: no cyanosis of digits and no clubbing  PSYCH: alert & oriented x 3, fluent speech NEURO: no focal motor/sensory deficits  LABORATORY DATA:  I have reviewed the data as listed    Latest Ref Rng & Units 04/13/2023    2:13 PM 02/26/2023    9:23 AM 01/11/2023    9:50 AM  CBC  WBC 4.0 - 10.5 K/uL 14.1  13.3  12.8   Hemoglobin 13.0 - 17.0 Burns/dL 87.9  87.9  89.1   Hematocrit 39.0 - 52.0 % 37.5  38.3  33.8   Platelets 150 - 400 K/uL 150  139  136        Latest Ref Rng & Units 04/13/2023    2:13 PM 02/26/2023    9:23 AM 01/11/2023    9:50 AM  CMP  Glucose 70 - 99 mg/dL 723  79  807   BUN 8 - 23 mg/dL 18  28  24    Creatinine 0.61 - 1.24 mg/dL 9.17  9.16  8.87   Sodium 135 - 145 mmol/L 138  141  140   Potassium 3.5 - 5.1 mmol/L 4.1  4.4  4.3   Chloride 98 - 111 mmol/L 105  107  109   CO2 22 - 32 mmol/L 28  26  24    Calcium  8.9 - 10.3 mg/dL 9.3  9.3  9.4   Total Protein 6.5 - 8.1 Burns/dL 6.2  6.8  6.0   Total Bilirubin 0.0 - 1.2 mg/dL 0.5  0.8  0.5   Alkaline Phos 38 - 126 U/L 67  54  61   AST 15 - 41 U/L 15  22  11    ALT 0 - 44 U/L 16  25  10      RADIOGRAPHIC STUDIES: No results found.  ASSESSMENT & PLAN Robert Burns 83 y.o. male  with medical history significant for newly diagnosed CLL who presents for a follow up visit.  Previously we discussed the diagnosis of CLL and treatment options moving forward.  We discussed that this is a chronic condition with no definitive cure.  Additionally we discussed that treatment is reserved until certain  criteria are met.  Typically treatment is started with rapid increase in lymphocytes, massively enlarged lymph nodes, anemia, or thrombocytopenia.  The patient currently does not meet any criteria necessary to start treatment.  As such I would recommend continued close observation of his blood counts and symptoms.  The patient knows to call the clinic with any issues involving fevers, chills, sweats, sudden weight loss, or rapidly enlarging lymph node.  # CLL Robert Stage 0 -- Findings at this time are consistent with a CLL Robert stage 0.   --Prognostic panel showed del 13 with positive ZAP 70 and negative IgVH --Labs today show white blood cell count 14.1, Hgb 12.0, MCV 81.9, Plt 150 --No indication for imaging at this time --No criteria met for beginning treatment.  Would recommend continued monitoring --Return to clinic in 6 months time for further evaluation of CLL, however following him more closely for his iron  deficiency anemia.  # Anemia -- Appears microcytic, concern for iron  deficiency anemia as the etiology. --Iron  labs today show ferritin 71 with iron  sat of 9% and iron  of 26 --seen by urology and IR, planned for prostate artery embolization, but this was aborted during the procedure and only pelvic arteriography was performed. Unable to reach target artery.  No orders of the defined types were placed in this encounter.   All questions were answered. The patient knows to call the clinic with any problems, questions or concerns.  A total of more than 30 minutes were spent on this encounter with face-to-face time and non-face-to-face time, including preparing to see the patient,  ordering tests and/or medications, counseling the patient and coordination of care as outlined above.   Robert IVAR Kidney, MD Department of Hematology/Oncology [facility] Phone: [phone] Pager: [phone] Email: [email or web address]  04/15/2023 11:10 AM  Robert Burns Robert Burns, Robert Burns, Robert Burns, Robert Burns, Robert Burns, Robert Burns, Robert Burns, Robert Burns, Robert Burns, Robert Burns, Robert Burns, Robert Burns, Robert Burns, Robert Burns, Robert Burns, Seymour JF, Remington [facility]. iwCLL guidelines for diagnosis, indications for treatment, response assessment, and supportive management of CLL. Blood. 2018 Jun 21;131(25):2745-2760.  Active disease should be clearly documented to initiate therapy. At least 1 of the following criteria should be met.  1) Evidence of progressive marrow failure as manifested by the development of, or worsening of, anemia and/or thrombocytopenia. Cutoff levels of Hb <10 Burns/dL or platelet counts <899  109/L are generally regarded as indication for treatment. However, in some patients, platelet counts <100  109/L may remain stable over a long period; this situation does not automatically require therapeutic intervention. 2) Massive (ie, >=6 cm below the left costal margin) or progressive or symptomatic splenomegaly. 3) Massive nodes (ie, >=10 cm in longest diameter) or progressive or symptomatic lymphadenopathy. 4) Progressive lymphocytosis with an increase of >=50% over a 46-month period, or lymphocyte doubling time (LDT) <6 months. LDT can be obtained by linear regression extrapolation of absolute lymphocyte counts obtained at intervals of 2 weeks over an observation period of 2 to 3 months; patients with initial blood lymphocyte counts <30  109/L may require a longer observation period to determine the LDT. Factors contributing to lymphocytosis other than CLL (eg, infections, steroid administration) should be excluded. 5) Autoimmune  complications including anemia or thrombocytopenia poorly responsive to corticosteroids. 6) Symptomatic or functional extranodal involvement (eg, skin, Burns, lung, spine). Disease-related symptoms as defined by any of the following: Unintentional weight loss >=10% within the previous 6 months. Significant  fatigue (ie, ECOG performance scale 2 or worse; cannot work or unable to perform usual activities). Fevers >=100.9F or 38.0C for 2 or more weeks without evidence of infection. Night sweats for >=1 month without evidence of infection.

## 2023-04-13 ENCOUNTER — Inpatient Hospital Stay: Payer: Medicare HMO | Attending: Hematology and Oncology

## 2023-04-13 ENCOUNTER — Inpatient Hospital Stay: Payer: Medicare HMO | Admitting: Hematology and Oncology

## 2023-04-13 DIAGNOSIS — D500 Iron deficiency anemia secondary to blood loss (chronic): Secondary | ICD-10-CM | POA: Diagnosis not present

## 2023-04-13 DIAGNOSIS — D649 Anemia, unspecified: Secondary | ICD-10-CM | POA: Diagnosis not present

## 2023-04-13 DIAGNOSIS — C9110 Chronic lymphocytic leukemia of B-cell type not having achieved remission: Secondary | ICD-10-CM | POA: Insufficient documentation

## 2023-04-13 DIAGNOSIS — Z79899 Other long term (current) drug therapy: Secondary | ICD-10-CM | POA: Insufficient documentation

## 2023-04-13 LAB — CBC WITH DIFFERENTIAL (CANCER CENTER ONLY)
Abs Immature Granulocytes: 0.03 10*3/uL (ref 0.00–0.07)
Basophils Absolute: 0 10*3/uL (ref 0.0–0.1)
Basophils Relative: 0 %
Eosinophils Absolute: 0.2 10*3/uL (ref 0.0–0.5)
Eosinophils Relative: 2 %
HCT: 37.5 % — ABNORMAL LOW (ref 39.0–52.0)
Hemoglobin: 12 g/dL — ABNORMAL LOW (ref 13.0–17.0)
Immature Granulocytes: 0 %
Lymphocytes Relative: 65 %
Lymphs Abs: 9.2 10*3/uL — ABNORMAL HIGH (ref 0.7–4.0)
MCH: 26.2 pg (ref 26.0–34.0)
MCHC: 32 g/dL (ref 30.0–36.0)
MCV: 81.9 fL (ref 80.0–100.0)
Monocytes Absolute: 0.6 10*3/uL (ref 0.1–1.0)
Monocytes Relative: 5 %
Neutro Abs: 4 10*3/uL (ref 1.7–7.7)
Neutrophils Relative %: 28 %
Platelet Count: 150 10*3/uL (ref 150–400)
RBC: 4.58 MIL/uL (ref 4.22–5.81)
RDW: 15.6 % — ABNORMAL HIGH (ref 11.5–15.5)
Smear Review: NORMAL
WBC Count: 14.1 10*3/uL — ABNORMAL HIGH (ref 4.0–10.5)
nRBC: 0 % (ref 0.0–0.2)

## 2023-04-13 LAB — CMP (CANCER CENTER ONLY)
ALT: 16 U/L (ref 0–44)
AST: 15 U/L (ref 15–41)
Albumin: 4 g/dL (ref 3.5–5.0)
Alkaline Phosphatase: 67 U/L (ref 38–126)
Anion gap: 5 (ref 5–15)
BUN: 18 mg/dL (ref 8–23)
CO2: 28 mmol/L (ref 22–32)
Calcium: 9.3 mg/dL (ref 8.9–10.3)
Chloride: 105 mmol/L (ref 98–111)
Creatinine: 0.82 mg/dL (ref 0.61–1.24)
GFR, Estimated: >60 mL/min (ref >60)
Glucose, Bld: 276 mg/dL — ABNORMAL HIGH (ref 70–99)
Potassium: 4.1 mmol/L (ref 3.5–5.1)
Sodium: 138 mmol/L (ref 135–145)
Total Bilirubin: 0.5 mg/dL (ref 0.0–1.2)
Total Protein: 6.2 g/dL — ABNORMAL LOW (ref 6.5–8.1)

## 2023-04-13 LAB — FERRITIN: Ferritin: 25 ng/mL (ref 24–336)

## 2023-04-13 LAB — SAMPLE TO BLOOD BANK

## 2023-04-13 LAB — IRON AND IRON BINDING CAPACITY (CC-WL,HP ONLY)
Iron: 35 ug/dL — ABNORMAL LOW (ref 45–182)
Saturation Ratios: 10 % — ABNORMAL LOW (ref 17.9–39.5)
TIBC: 351 ug/dL (ref 250–450)
UIBC: 316 ug/dL (ref 117–376)

## 2023-04-16 ENCOUNTER — Telehealth: Payer: Self-pay | Admitting: *Deleted

## 2023-04-16 NOTE — Telephone Encounter (Signed)
-----   Message from Norleen ONEIDA Kidney IV sent at 04/15/2023 11:11 AM EST ----- Please let Robert Burns know that his ferritin levels are below 30.  He would qualify for another round of IV iron  therapy.  If he is willing to proceed we will get it scheduled as soon as is feasible.  Otherwise encourage him to eat an iron  rich diet and prescribed for him ferrous sulfate 325 mg p.o. daily. ----- Message ----- From: Rebecka, Lab In [place] Sent: 04/13/2023   2:30 PM EST To: Norleen ONEIDA Kidney MADISON, MD

## 2023-04-16 NOTE — Telephone Encounter (Signed)
 TCT patient regarding recent lab results. No answer but was able to leave vm message for pt to return this call at his earliest convenience to [phone]

## 2023-04-18 ENCOUNTER — Telehealth: Payer: Self-pay | Admitting: *Deleted

## 2023-04-18 NOTE — Telephone Encounter (Addendum)
 Patient's daughter [name] called in response to message left requesting they contact office for results. Gave her information per Dr. Lafonda message below. [name] verbalized understanding of information. She states her father is not the best at eating iron  rich foods and does not like the oral iron  tablet. She said the IV iron  would be the best option. Advised her that Dr. Federico will be informed of their choice.    ----- Message from Nurse Almarie T sent at 04/16/2023  4:53 PM EST ----- Try calling him again. No answer today, 04/16/23 BT ----- Message ----- From: Federico Norleen ONEIDA MADISON, MD Sent: 04/15/2023  11:12 AM EST To: Almarie DELENA Arabia, RN  Please let Mr. Matton know that his ferritin levels are below 30.  He would qualify for another round of IV iron  therapy.  If he is willing to proceed we will get it scheduled as soon as is feasible.  Otherwise encourage him to eat an iron  rich diet and prescribed for him ferrous sulfate 325 mg p.o. daily.

## 2023-04-20 DIAGNOSIS — R338 Other retention of urine: Secondary | ICD-10-CM | POA: Diagnosis not present

## 2023-04-20 DIAGNOSIS — N401 Enlarged prostate with lower urinary tract symptoms: Secondary | ICD-10-CM | POA: Diagnosis not present

## 2023-04-26 ENCOUNTER — Other Ambulatory Visit: Payer: Self-pay | Admitting: Hematology and Oncology

## 2023-04-27 ENCOUNTER — Telehealth: Payer: Self-pay

## 2023-04-27 NOTE — Telephone Encounter (Addendum)
Auth Submission: PENDING Site of care: Site of care: [facility] Payer: [employer] Medication & CPT/J Code(s) submitted: Feraheme (ferumoxytol) [id number] Route of submission (phone, fax, portal):  Phone # Fax # Auth type: Buy/Bill PB Units/visits requested: 510mg  x 2 doses Reference number:  Approval from:  to

## 2023-04-29 NOTE — Progress Notes (Unsigned)
Cardiology Office Note:  .   Date:  05/01/2023  ID:  Robert Burns, DOB 1940/04/24, MRN [id number] PCP: Robert Ates, MD  [facility] Providers Cardiologist:Robert Allyson Sabal, MD }   History of Present Illness: Robert Burns is a 83 y.o. 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 Allyson Burns in 2004, PPM in situ for SSS. OSA on CPAP,   He has had multiple hospitalizations for recurrent chest pain, unstable angina, GI bleed, with most recent on December 01, 2022 with repeat cardiac catheterization by Dr. [place] revealing an occluded native RCA and RCA vein graft which apparently was his "culprit vessel" he had a patent LIMA to the LAD and known occluded SVG to ramus branch and OM and patent vein graft to the diagonal with 80% in-stent restenosis within the proximal vein graft stent.  He was to be treated medically.    His clopidogrel was discontinued.  He has been diagnosed with PAF during one of his hospitalizations and remains on Xarelto for oral anticoagulation.  Permanent pacemaker continues remote interrogations and did reveal some PAF   When last seen by Dr. Allyson Burns on 01/29/2023 no medication or  treatment changes were made.  He was to continue secondary prevention of CAD, he had nitrate responsive chest pain and therefore was continued on antianginal meds.  [name] comes today with multiple complaints.  He talks in detail about all of his physical ailments and cardiac issues.  He now has a indwelling Foley catheter with the urostomy bag which is being followed by Dr. Jennette Burns urologist.  The patient has been having issues with stream disturbance and is under the impression that he will need to have surgery for repair.   He is here today for surgical clearance although we have  not received any surgical clearance request form from any of the urological physicians.  The patient has chronic back pain, he did have chest pain today on his way into the office for which she took a nitroglycerin.  He is very frail.  He denies any significant shortness of breath or dizziness.  ROS: As above otherwise negative  Studies Reviewed: [name]    LHC 12/01/2022 Mid LM lesion is 50% stenosed.   Ost LAD to Prox LAD lesion is 100% stenosed.   Mid LAD-2 lesion is 20% stenosed.   Dist LAD lesion is 25% stenosed.   Ost Cx lesion is 40% stenosed.   Prox Cx lesion is 30% stenosed.   Mid Cx lesion is 75% stenosed.   Ost RCA to Dist RCA lesion is 100% stenosed.   Ost Ramus lesion is 95% stenosed.   Prox Graft to Dist Graft lesion between Ramus and 2nd Mrg  is 100% stenosed.   Origin to Mid Graft lesion before Prox LAD  is 100% stenosed.   Origin to Prox Graft lesion is 100% stenosed.   Ost 3rd Mrg to 3rd Mrg lesion is 99% stenosed.   Prox Graft lesion is 70% stenosed.   Non-stenotic Mid LAD-1 lesion.   Non-stenotic Mid Graft to Dist Graft lesion was previously treated.   Non-stenotic Mid Graft lesion was previously treated.   Non-stenotic Mid Graft to Dist Graft lesion was previously treated.  LV end diastolic pressure is moderately elevated.   Severe 3 vessel obstructive CAD Patent LIMA to the LAD Known occlusion of SVG to LCx Patent SVG to diagonal. Multiple prior stents. Within the proximal stent there is a focal 70% stenosis. Other stented segments look good.  Occluded SVG to RCA. This is new. There are some left to right collaterals to distal RCA Moderately elevated LVEDP 27 mm Hg   Plan: SVG to RCA cannot be salvaged. This appears to be the culprit. Recommend continued medical therapy. If refractory angina could consider balloon angioplasty of in stent disease in SVG to diagonal but long term patency would be limited.   Physical Exam:   VS:  BP (!) 90/58 (BP Location: Right Arm,  Patient Position: Sitting, Cuff Size: Normal)   Pulse 88   Ht 5\' 7"  (1.702 m)   Wt 164 lb (74.4 kg)   SpO2 96%   BMI 25.69 kg/m    Wt Readings from Last 3 Encounters:  05/01/23 164 lb (74.4 kg)  02/26/23 159 lb (72.1 kg)  01/29/23 157 lb (71.2 kg)    GEN: Well nourished, well developed in no acute distress pale NECK: No JVD; No carotid bruits CARDIAC: RRR, soft systolic murmurs, rubs, gallops RESPIRATORY:  Clear to auscultation without rales, wheezing or rhonchi  ABDOMEN: Soft, non-tender, non-distended GU: Foley catheter bag with hematuria noted EXTREMITIES: Bilateral 2+ lower extremity edema; No deformity   ASSESSMENT AND PLAN: .    Multivessel coronary artery disease: The patient has had CABG, multiple cardiac catheterizations most recent in the setting of non-STEMI on 12/01/2022 with an occluded RCA vein graft which was culprit vessel, with faint left-to-right collaterals patent LIMA to LAD and occluded circumflex vein graft as well as 75 to 80% in-stent restenosis within the diagonal branch stent.  He is now being treated medically.  The patient is on isosorbide mononitrate 60 mg daily and has taken 1 nitroglycerin on his way to the office today as he was having discomfort in his chest.  I will not make any changes in his medication regimen at this time.  2.  Hematuria: Patient apparently has issues with BPH, and is being followed by Dr. Jennette Burns and now has an indwelling Foley catheter.  The patient does have some hematuria noted in the bag he is very pale and does have some lower extremity edema along with hypotension.  I will check a CBC.  Most recent labs revealed an hemoglobin of 10.0 within the last 3 days.         I have advised him to follow-up with Dr. Jennette Burns for ongoing management.  We have not received any type of surgical clearance in order to move forward with any type of procedure or surgery.  Once we receive this we can make recommendations concerning his Xarelto as  well as risk evaluation to proceed.  3.  Peripheral arterial disease: History of multiple interventions by Dr. Allyson Burns most recently in 2017 to the external iliac artery.  He has chronic lower extremity edema.  Most recent Doppler studies were in 2023 with moderate disease in his right SFA with an ABI of 0.72.  Will defer to Dr. Allyson Burns for any further testing.  4.  Sick sinus syndrome.  Pacemaker in situ.  Being followed remotely and evaluated by Dr. Royann Shivers.  5.  Paroxysmal atrial fibrillation: Patient's heart rate was regular today.  He remains on Xarelto.  With hematuria we may need to revisit this.  Especially if his  hemoglobin has decreased significantly.         Signed, Bettey Mare. Liborio Nixon, ANP, AACC

## 2023-05-01 ENCOUNTER — Encounter: Payer: Self-pay | Admitting: Adult Health

## 2023-05-01 ENCOUNTER — Ambulatory Visit: Payer: Medicare HMO | Attending: Adult Health | Admitting: Adult Health

## 2023-05-01 VITALS — BP 90/58 | HR 88 | Ht 67.0 in | Wt 164.0 lb

## 2023-05-01 DIAGNOSIS — I739 Peripheral vascular disease, unspecified: Secondary | ICD-10-CM

## 2023-05-01 DIAGNOSIS — I2510 Atherosclerotic heart disease of native coronary artery without angina pectoris: Secondary | ICD-10-CM

## 2023-05-01 DIAGNOSIS — R319 Hematuria, unspecified: Secondary | ICD-10-CM

## 2023-05-01 DIAGNOSIS — N3001 Acute cystitis with hematuria: Secondary | ICD-10-CM | POA: Diagnosis not present

## 2023-05-01 DIAGNOSIS — I959 Hypotension, unspecified: Secondary | ICD-10-CM | POA: Diagnosis not present

## 2023-05-01 DIAGNOSIS — Z950 Presence of cardiac pacemaker: Secondary | ICD-10-CM | POA: Diagnosis not present

## 2023-05-01 MED ORDER — JARDIANCE 10 MG PO TABS: 10.0000 mg | ORAL_TABLET | Freq: Every day | ORAL | 6 refills | Status: DC

## 2023-05-01 MED ORDER — RIVAROXABAN 20 MG PO TABS: 20.0000 mg | ORAL_TABLET | Freq: Every day | ORAL | 3 refills | Status: DC

## 2023-05-01 NOTE — Patient Instructions (Addendum)
Medication Instructions:  No Changes *If you need a refill on your cardiac medications before your next appointment, please call your pharmacy*   Lab Work: CBC, BMET If you have labs (blood work) drawn today and your tests are completely normal, you will receive your results only by: MyChart Message (if you have MyChart) OR A paper copy in the mail If you have any lab test that is abnormal or we need to change your treatment, we will call you to review the results.   Testing/Procedures: No Testing   Follow-Up: At [facility], you and your health needs are our priority.  As part of our continuing mission to provide you with exceptional heart care, we have created designated Provider Care Teams.  These Care Teams include your primary Cardiologist (physician) and Advanced Practice Providers (APPs -  Physician Assistants and Nurse Practitioners) who all work together to provide you with the care you need, when you need it.  We recommend signing up for the patient portal called "MyChart".  Sign up information is provided on this After Visit Summary.  MyChart is used to connect with patients for Virtual Visits (Telemedicine).  Patients are able to view lab/test results, encounter notes, upcoming appointments, etc.  Non-urgent messages can be sent to your provider as well.   To learn more about what you can do with MyChart, go to [email or web address].    Your next appointment:   6 month(s)  Provider:   Nanetta Batty, MD

## 2023-05-02 ENCOUNTER — Inpatient Hospital Stay (HOSPITAL_COMMUNITY)
Admission: EM | Admit: 2023-05-02 | Discharge: 2023-05-07 | DRG: 699 | Disposition: A | Discharge status: Home / Self Care | Payer: Medicare HMO | Attending: Internal Medicine | Admitting: Internal Medicine

## 2023-05-02 ENCOUNTER — Encounter (HOSPITAL_COMMUNITY): Payer: Self-pay | Admitting: *Deleted

## 2023-05-02 ENCOUNTER — Encounter: Payer: Self-pay | Admitting: Hematology and Oncology

## 2023-05-02 ENCOUNTER — Other Ambulatory Visit: Payer: Self-pay

## 2023-05-02 ENCOUNTER — Other Ambulatory Visit: Payer: Self-pay | Admitting: Hematology and Oncology

## 2023-05-02 DIAGNOSIS — Z888 Allergy status to other drugs, medicaments and biological substances status: Secondary | ICD-10-CM

## 2023-05-02 DIAGNOSIS — Z950 Presence of cardiac pacemaker: Secondary | ICD-10-CM | POA: Diagnosis not present

## 2023-05-02 DIAGNOSIS — Z85820 Personal history of malignant melanoma of skin: Secondary | ICD-10-CM | POA: Diagnosis not present

## 2023-05-02 DIAGNOSIS — T83511A Infection and inflammatory reaction due to indwelling urethral catheter, initial encounter: Principal | ICD-10-CM | POA: Diagnosis present

## 2023-05-02 DIAGNOSIS — Z7984 Long term (current) use of oral hypoglycemic drugs: Secondary | ICD-10-CM | POA: Diagnosis not present

## 2023-05-02 DIAGNOSIS — Z789 Other specified health status: Secondary | ICD-10-CM

## 2023-05-02 DIAGNOSIS — R319 Hematuria, unspecified: Principal | ICD-10-CM

## 2023-05-02 DIAGNOSIS — Z79899 Other long term (current) drug therapy: Secondary | ICD-10-CM

## 2023-05-02 DIAGNOSIS — Z87891 Personal history of nicotine dependence: Secondary | ICD-10-CM

## 2023-05-02 DIAGNOSIS — Z794 Long term (current) use of insulin: Secondary | ICD-10-CM | POA: Diagnosis not present

## 2023-05-02 DIAGNOSIS — Z85828 Personal history of other malignant neoplasm of skin: Secondary | ICD-10-CM | POA: Diagnosis not present

## 2023-05-02 DIAGNOSIS — Z7901 Long term (current) use of anticoagulants: Secondary | ICD-10-CM

## 2023-05-02 DIAGNOSIS — R7989 Other specified abnormal findings of blood chemistry: Secondary | ICD-10-CM

## 2023-05-02 DIAGNOSIS — E785 Hyperlipidemia, unspecified: Secondary | ICD-10-CM | POA: Diagnosis present

## 2023-05-02 DIAGNOSIS — R591 Generalized enlarged lymph nodes: Secondary | ICD-10-CM | POA: Diagnosis not present

## 2023-05-02 DIAGNOSIS — E7800 Pure hypercholesterolemia, unspecified: Secondary | ICD-10-CM | POA: Diagnosis present

## 2023-05-02 DIAGNOSIS — Z23 Encounter for immunization: Secondary | ICD-10-CM

## 2023-05-02 DIAGNOSIS — I25709 Atherosclerosis of coronary artery bypass graft(s), unspecified, with unspecified angina pectoris: Secondary | ICD-10-CM | POA: Diagnosis not present

## 2023-05-02 DIAGNOSIS — D509 Iron deficiency anemia, unspecified: Secondary | ICD-10-CM | POA: Diagnosis not present

## 2023-05-02 DIAGNOSIS — N390 Urinary tract infection, site not specified: Principal | ICD-10-CM | POA: Diagnosis present

## 2023-05-02 DIAGNOSIS — I480 Paroxysmal atrial fibrillation: Secondary | ICD-10-CM | POA: Diagnosis present

## 2023-05-02 DIAGNOSIS — Z83438 Family history of other disorder of lipoprotein metabolism and other lipidemia: Secondary | ICD-10-CM

## 2023-05-02 DIAGNOSIS — Y846 Urinary catheterization as the cause of abnormal reaction of the patient, or of later complication, without mention of misadventure at the time of the procedure: Secondary | ICD-10-CM | POA: Diagnosis present

## 2023-05-02 DIAGNOSIS — I495 Sick sinus syndrome: Secondary | ICD-10-CM | POA: Diagnosis present

## 2023-05-02 DIAGNOSIS — C9110 Chronic lymphocytic leukemia of B-cell type not having achieved remission: Secondary | ICD-10-CM | POA: Diagnosis not present

## 2023-05-02 DIAGNOSIS — I2510 Atherosclerotic heart disease of native coronary artery without angina pectoris: Secondary | ICD-10-CM | POA: Diagnosis present

## 2023-05-02 DIAGNOSIS — E872 Acidosis, unspecified: Secondary | ICD-10-CM | POA: Diagnosis not present

## 2023-05-02 DIAGNOSIS — I739 Peripheral vascular disease, unspecified: Secondary | ICD-10-CM | POA: Diagnosis present

## 2023-05-02 DIAGNOSIS — Z833 Family history of diabetes mellitus: Secondary | ICD-10-CM | POA: Diagnosis not present

## 2023-05-02 DIAGNOSIS — Z955 Presence of coronary angioplasty implant and graft: Secondary | ICD-10-CM | POA: Diagnosis not present

## 2023-05-02 DIAGNOSIS — E1151 Type 2 diabetes mellitus with diabetic peripheral angiopathy without gangrene: Secondary | ICD-10-CM | POA: Diagnosis not present

## 2023-05-02 DIAGNOSIS — Z8249 Family history of ischemic heart disease and other diseases of the circulatory system: Secondary | ICD-10-CM

## 2023-05-02 DIAGNOSIS — D72829 Elevated white blood cell count, unspecified: Secondary | ICD-10-CM | POA: Diagnosis not present

## 2023-05-02 DIAGNOSIS — M7989 Other specified soft tissue disorders: Secondary | ICD-10-CM | POA: Diagnosis present

## 2023-05-02 DIAGNOSIS — Z860100 Personal history of colon polyps, unspecified: Secondary | ICD-10-CM

## 2023-05-02 DIAGNOSIS — B9562 Methicillin resistant Staphylococcus aureus infection as the cause of diseases classified elsewhere: Secondary | ICD-10-CM | POA: Diagnosis not present

## 2023-05-02 DIAGNOSIS — Z951 Presence of aortocoronary bypass graft: Secondary | ICD-10-CM

## 2023-05-02 DIAGNOSIS — G4733 Obstructive sleep apnea (adult) (pediatric): Secondary | ICD-10-CM

## 2023-05-02 DIAGNOSIS — Z881 Allergy status to other antibiotic agents status: Secondary | ICD-10-CM

## 2023-05-02 DIAGNOSIS — K5730 Diverticulosis of large intestine without perforation or abscess without bleeding: Secondary | ICD-10-CM | POA: Diagnosis not present

## 2023-05-02 DIAGNOSIS — I10 Essential (primary) hypertension: Secondary | ICD-10-CM | POA: Diagnosis present

## 2023-05-02 DIAGNOSIS — N400 Enlarged prostate without lower urinary tract symptoms: Secondary | ICD-10-CM | POA: Diagnosis present

## 2023-05-02 DIAGNOSIS — E119 Type 2 diabetes mellitus without complications: Secondary | ICD-10-CM | POA: Diagnosis not present

## 2023-05-02 DIAGNOSIS — I25810 Atherosclerosis of coronary artery bypass graft(s) without angina pectoris: Secondary | ICD-10-CM | POA: Diagnosis present

## 2023-05-02 DIAGNOSIS — N281 Cyst of kidney, acquired: Secondary | ICD-10-CM | POA: Diagnosis not present

## 2023-05-02 LAB — CBC
HCT: 38.4 % — ABNORMAL LOW (ref 39.0–52.0)
Hematocrit: 38.4 % (ref 37.5–51.0)
Hemoglobin: 11.7 g/dL — ABNORMAL LOW (ref 13.0–17.7)
Hemoglobin: 12 g/dL — ABNORMAL LOW (ref 13.0–17.0)
MCH: 25.7 pg — ABNORMAL LOW (ref 26.6–33.0)
MCH: 26.1 pg (ref 26.0–34.0)
MCHC: 30.5 g/dL — ABNORMAL LOW (ref 31.5–35.7)
MCHC: 31.3 g/dL (ref 30.0–36.0)
MCV: 84 fL (ref 79–97)
MCV: 83.5 fL (ref 80.0–100.0)
Platelets: 186 10*3/uL (ref 150–450)
Platelets: 193 10*3/uL (ref 150–400)
RBC: 4.55 x10E6/uL (ref 4.14–5.80)
RBC: 4.6 MIL/uL (ref 4.22–5.81)
RDW: 15 % (ref 11.6–15.4)
RDW: 16.5 % — ABNORMAL HIGH (ref 11.5–15.5)
WBC: 19.8 10*3/uL — ABNORMAL HIGH (ref 3.4–10.8)
WBC: 28.8 10*3/uL — ABNORMAL HIGH (ref 4.0–10.5)
nRBC: 0 % (ref 0.0–0.2)

## 2023-05-02 LAB — COMPREHENSIVE METABOLIC PANEL
ALT: 22 U/L (ref 0–44)
AST: 29 U/L (ref 15–41)
Albumin: 4.2 g/dL (ref 3.5–5.0)
Alkaline Phosphatase: 63 U/L (ref 38–126)
Anion gap: 15 (ref 5–15)
BUN: 26 mg/dL — ABNORMAL HIGH (ref 8–23)
CO2: 20 mmol/L — ABNORMAL LOW (ref 22–32)
Calcium: 9.7 mg/dL (ref 8.9–10.3)
Chloride: 104 mmol/L (ref 98–111)
Creatinine, Ser: 1.01 mg/dL (ref 0.61–1.24)
GFR, Estimated: >60 mL/min (ref >60)
Glucose, Bld: 129 mg/dL — ABNORMAL HIGH (ref 70–99)
Potassium: 4.2 mmol/L (ref 3.5–5.1)
Sodium: 139 mmol/L (ref 135–145)
Total Bilirubin: 0.9 mg/dL (ref 0.0–1.2)
Total Protein: 6.8 g/dL (ref 6.5–8.1)

## 2023-05-02 LAB — BASIC METABOLIC PANEL
BUN/Creatinine Ratio: 33 — ABNORMAL HIGH (ref 10–24)
BUN: 36 mg/dL — ABNORMAL HIGH (ref 8–27)
CO2: 22 mmol/L (ref 20–29)
Calcium: 10.2 mg/dL (ref 8.6–10.2)
Chloride: 104 mmol/L (ref 96–106)
Creatinine, Ser: 1.08 mg/dL (ref 0.76–1.27)
Glucose: 170 mg/dL — ABNORMAL HIGH (ref 70–99)
Potassium: 5.2 mmol/L (ref 3.5–5.2)
Sodium: 142 mmol/L (ref 134–144)
eGFR: 68 mL/min/{1.73_m2} (ref >59)

## 2023-05-02 LAB — LIPASE, BLOOD: Lipase: 33 U/L (ref 11–51)

## 2023-05-02 NOTE — ED Triage Notes (Signed)
The pt is c/o lt flank then  rt flank pain for several days and his bun has been increasing  he has a foley cath also

## 2023-05-03 ENCOUNTER — Other Ambulatory Visit: Payer: Self-pay

## 2023-05-03 ENCOUNTER — Telehealth: Payer: Self-pay

## 2023-05-03 ENCOUNTER — Telehealth: Payer: Self-pay | Admitting: Oncology

## 2023-05-03 ENCOUNTER — Telehealth: Payer: Self-pay | Admitting: Hematology and Oncology

## 2023-05-03 ENCOUNTER — Other Ambulatory Visit: Payer: Self-pay | Admitting: Physician Assistant

## 2023-05-03 ENCOUNTER — Emergency Department (HOSPITAL_COMMUNITY): Payer: Medicare HMO

## 2023-05-03 ENCOUNTER — Inpatient Hospital Stay (HOSPITAL_COMMUNITY): Payer: Medicare HMO

## 2023-05-03 DIAGNOSIS — B9562 Methicillin resistant Staphylococcus aureus infection as the cause of diseases classified elsewhere: Secondary | ICD-10-CM | POA: Diagnosis present

## 2023-05-03 DIAGNOSIS — I2510 Atherosclerotic heart disease of native coronary artery without angina pectoris: Secondary | ICD-10-CM | POA: Diagnosis present

## 2023-05-03 DIAGNOSIS — D509 Iron deficiency anemia, unspecified: Secondary | ICD-10-CM | POA: Diagnosis present

## 2023-05-03 DIAGNOSIS — T83511A Infection and inflammatory reaction due to indwelling urethral catheter, initial encounter: Secondary | ICD-10-CM | POA: Diagnosis present

## 2023-05-03 DIAGNOSIS — E119 Type 2 diabetes mellitus without complications: Secondary | ICD-10-CM | POA: Diagnosis not present

## 2023-05-03 DIAGNOSIS — Z85820 Personal history of malignant melanoma of skin: Secondary | ICD-10-CM | POA: Diagnosis not present

## 2023-05-03 DIAGNOSIS — Z881 Allergy status to other antibiotic agents status: Secondary | ICD-10-CM | POA: Diagnosis not present

## 2023-05-03 DIAGNOSIS — N390 Urinary tract infection, site not specified: Secondary | ICD-10-CM | POA: Diagnosis present

## 2023-05-03 DIAGNOSIS — K5730 Diverticulosis of large intestine without perforation or abscess without bleeding: Secondary | ICD-10-CM | POA: Diagnosis not present

## 2023-05-03 DIAGNOSIS — R7989 Other specified abnormal findings of blood chemistry: Secondary | ICD-10-CM | POA: Diagnosis present

## 2023-05-03 DIAGNOSIS — Y846 Urinary catheterization as the cause of abnormal reaction of the patient, or of later complication, without mention of misadventure at the time of the procedure: Secondary | ICD-10-CM | POA: Diagnosis present

## 2023-05-03 DIAGNOSIS — I480 Paroxysmal atrial fibrillation: Secondary | ICD-10-CM | POA: Diagnosis present

## 2023-05-03 DIAGNOSIS — I25709 Atherosclerosis of coronary artery bypass graft(s), unspecified, with unspecified angina pectoris: Secondary | ICD-10-CM

## 2023-05-03 DIAGNOSIS — C9110 Chronic lymphocytic leukemia of B-cell type not having achieved remission: Secondary | ICD-10-CM

## 2023-05-03 DIAGNOSIS — Z8249 Family history of ischemic heart disease and other diseases of the circulatory system: Secondary | ICD-10-CM | POA: Diagnosis not present

## 2023-05-03 DIAGNOSIS — E872 Acidosis, unspecified: Secondary | ICD-10-CM | POA: Diagnosis present

## 2023-05-03 DIAGNOSIS — R591 Generalized enlarged lymph nodes: Secondary | ICD-10-CM | POA: Diagnosis not present

## 2023-05-03 DIAGNOSIS — E7800 Pure hypercholesterolemia, unspecified: Secondary | ICD-10-CM | POA: Diagnosis present

## 2023-05-03 DIAGNOSIS — Z7901 Long term (current) use of anticoagulants: Secondary | ICD-10-CM | POA: Diagnosis not present

## 2023-05-03 DIAGNOSIS — Z950 Presence of cardiac pacemaker: Secondary | ICD-10-CM | POA: Diagnosis not present

## 2023-05-03 DIAGNOSIS — M7989 Other specified soft tissue disorders: Secondary | ICD-10-CM

## 2023-05-03 DIAGNOSIS — G4733 Obstructive sleep apnea (adult) (pediatric): Secondary | ICD-10-CM | POA: Diagnosis not present

## 2023-05-03 DIAGNOSIS — Z833 Family history of diabetes mellitus: Secondary | ICD-10-CM | POA: Diagnosis not present

## 2023-05-03 DIAGNOSIS — Z7984 Long term (current) use of oral hypoglycemic drugs: Secondary | ICD-10-CM | POA: Diagnosis not present

## 2023-05-03 DIAGNOSIS — I495 Sick sinus syndrome: Secondary | ICD-10-CM | POA: Diagnosis present

## 2023-05-03 DIAGNOSIS — Z85828 Personal history of other malignant neoplasm of skin: Secondary | ICD-10-CM | POA: Diagnosis not present

## 2023-05-03 DIAGNOSIS — I10 Essential (primary) hypertension: Secondary | ICD-10-CM | POA: Diagnosis present

## 2023-05-03 DIAGNOSIS — E1151 Type 2 diabetes mellitus with diabetic peripheral angiopathy without gangrene: Secondary | ICD-10-CM | POA: Diagnosis present

## 2023-05-03 DIAGNOSIS — Z794 Long term (current) use of insulin: Secondary | ICD-10-CM | POA: Diagnosis not present

## 2023-05-03 DIAGNOSIS — N281 Cyst of kidney, acquired: Secondary | ICD-10-CM | POA: Diagnosis not present

## 2023-05-03 DIAGNOSIS — Z23 Encounter for immunization: Secondary | ICD-10-CM | POA: Diagnosis present

## 2023-05-03 DIAGNOSIS — Z87891 Personal history of nicotine dependence: Secondary | ICD-10-CM | POA: Diagnosis not present

## 2023-05-03 DIAGNOSIS — N400 Enlarged prostate without lower urinary tract symptoms: Secondary | ICD-10-CM | POA: Diagnosis present

## 2023-05-03 DIAGNOSIS — Z955 Presence of coronary angioplasty implant and graft: Secondary | ICD-10-CM | POA: Diagnosis not present

## 2023-05-03 LAB — DIFFERENTIAL
Abs Immature Granulocytes: 0 10*3/uL (ref 0.00–0.07)
Basophils Absolute: 0 10*3/uL (ref 0.0–0.1)
Basophils Relative: 0 %
Eosinophils Absolute: 0.3 10*3/uL (ref 0.0–0.5)
Eosinophils Relative: 1 %
Lymphocytes Relative: 82 %
Lymphs Abs: 23.6 10*3/uL — ABNORMAL HIGH (ref 0.7–4.0)
Monocytes Absolute: 0.9 10*3/uL (ref 0.1–1.0)
Monocytes Relative: 3 %
Neutro Abs: 4 10*3/uL (ref 1.7–7.7)
Neutrophils Relative %: 14 %

## 2023-05-03 LAB — URINALYSIS, ROUTINE W REFLEX MICROSCOPIC
Bilirubin Urine: NEGATIVE
Glucose, UA: >=500 mg/dL — AB
Ketones, ur: NEGATIVE mg/dL
Nitrite: NEGATIVE
Protein, ur: 100 mg/dL — AB
RBC / HPF: >50 RBC/hpf (ref 0–5)
Specific Gravity, Urine: 1.008 (ref 1.005–1.030)
WBC, UA: >50 WBC/hpf (ref 0–5)
pH: 6 (ref 5.0–8.0)

## 2023-05-03 LAB — URINALYSIS, W/ REFLEX TO CULTURE (INFECTION SUSPECTED)
Bilirubin Urine: NEGATIVE
Glucose, UA: >=500 mg/dL — AB
Ketones, ur: NEGATIVE mg/dL
Nitrite: POSITIVE — AB
Protein, ur: 100 mg/dL — AB
RBC / HPF: >50 RBC/hpf (ref 0–5)
Specific Gravity, Urine: 1.015 (ref 1.005–1.030)
WBC, UA: >50 WBC/hpf (ref 0–5)
pH: 5 (ref 5.0–8.0)

## 2023-05-03 LAB — I-STAT CG4 LACTIC ACID, ED
Lactic Acid, Venous: 3 mmol/L (ref 0.5–1.9)
Lactic Acid, Venous: 2.5 mmol/L (ref 0.5–1.9)

## 2023-05-03 LAB — PATHOLOGIST SMEAR REVIEW

## 2023-05-03 LAB — GLUCOSE, CAPILLARY
Glucose-Capillary: 154 mg/dL — ABNORMAL HIGH (ref 70–99)
Glucose-Capillary: 163 mg/dL — ABNORMAL HIGH (ref 70–99)
Glucose-Capillary: 214 mg/dL — ABNORMAL HIGH (ref 70–99)

## 2023-05-03 LAB — CBG MONITORING, ED: Glucose-Capillary: 99 mg/dL (ref 70–99)

## 2023-05-03 MED ORDER — INSULIN ASPART 100 UNIT/ML IJ SOLN
0.0000 [IU] | Freq: Every day | INTRAMUSCULAR | Status: DC
  Administered 2023-05-03: 2 [IU] via SUBCUTANEOUS

## 2023-05-03 MED ORDER — INFLUENZA VAC A&B SURF ANT ADJ 0.5 ML IM SUSY
0.5000 mL | PREFILLED_SYRINGE | INTRAMUSCULAR | Status: AC
  Administered 2023-05-04: 0.5 mL via INTRAMUSCULAR
  Filled 2023-05-03: qty 0.5

## 2023-05-03 MED ORDER — MORPHINE SULFATE (PF) 4 MG/ML IV SOLN
4.0000 mg | Freq: Once | INTRAVENOUS | Status: AC
Start: 2023-05-03 — End: 2023-05-03
  Administered 2023-05-03: 4 mg via INTRAVENOUS
  Filled 2023-05-03: qty 1

## 2023-05-03 MED ORDER — OXYCODONE HCL 5 MG PO TABS
5.0000 mg | ORAL_TABLET | ORAL | Status: DC | PRN
  Filled 2023-05-03: qty 1

## 2023-05-03 MED ORDER — INSULIN ASPART 100 UNIT/ML IJ SOLN
0.0000 [IU] | Freq: Three times a day (TID) | INTRAMUSCULAR | Status: DC
  Administered 2023-05-04 (×3): 2 [IU] via SUBCUTANEOUS
  Administered 2023-05-05 (×2): 3 [IU] via SUBCUTANEOUS
  Administered 2023-05-05: 2 [IU] via SUBCUTANEOUS

## 2023-05-03 MED ORDER — SODIUM CHLORIDE 0.9 % IV SOLN
2.0000 g | INTRAVENOUS | Status: DC
  Administered 2023-05-03 – 2023-05-06 (×4): 2 g via INTRAVENOUS
  Filled 2023-05-03 (×4): qty 20

## 2023-05-03 MED ORDER — POLYETHYLENE GLYCOL 3350 17 G PO PACK
17.0000 g | PACK | Freq: Two times a day (BID) | ORAL | Status: AC
  Filled 2023-05-03 (×5): qty 1

## 2023-05-03 MED ORDER — ATORVASTATIN CALCIUM 20 MG PO TABS
80.0000 mg | ORAL_TABLET | Freq: Every day | ORAL | Status: DC
Start: 2023-05-03 — End: 2023-05-07
  Administered 2023-05-03 – 2023-05-06 (×4): 80 mg via ORAL
  Filled 2023-05-03 (×4): qty 4

## 2023-05-03 MED ORDER — MELATONIN 5 MG PO TABS: 5.0000 mg | ORAL_TABLET | Freq: Every evening | ORAL | Status: DC | PRN

## 2023-05-03 MED ORDER — ACETAMINOPHEN 650 MG RE SUPP: 650.0000 mg | Freq: Four times a day (QID) | RECTAL | Status: DC | PRN

## 2023-05-03 MED ORDER — SODIUM CHLORIDE 0.9 % IV SOLN
2.0000 g | Freq: Once | INTRAVENOUS | Status: AC
  Administered 2023-05-03: 2 g via INTRAVENOUS
  Filled 2023-05-03: qty 20

## 2023-05-03 MED ORDER — SODIUM CHLORIDE 0.9 % IV SOLN: 1.0000 g | INTRAVENOUS | Status: DC

## 2023-05-03 MED ORDER — ISOSORBIDE MONONITRATE ER 60 MG PO TB24
60.0000 mg | ORAL_TABLET | Freq: Every day | ORAL | Status: DC
  Administered 2023-05-03 – 2023-05-07 (×5): 60 mg via ORAL
  Filled 2023-05-03 (×2): qty 1
  Filled 2023-05-03: qty 2
  Filled 2023-05-03 (×2): qty 1

## 2023-05-03 MED ORDER — LACTATED RINGERS IV BOLUS
2500.0000 mL | Freq: Once | INTRAVENOUS | Status: AC
  Administered 2023-05-03: 2500 mL via INTRAVENOUS

## 2023-05-03 MED ORDER — ACETAMINOPHEN 325 MG PO TABS
650.0000 mg | ORAL_TABLET | Freq: Four times a day (QID) | ORAL | Status: DC | PRN
  Administered 2023-05-03 – 2023-05-07 (×4): 650 mg via ORAL
  Filled 2023-05-03 (×6): qty 2

## 2023-05-03 MED ORDER — FINASTERIDE 5 MG PO TABS
5.0000 mg | ORAL_TABLET | Freq: Every day | ORAL | Status: DC
  Administered 2023-05-03 – 2023-05-07 (×5): 5 mg via ORAL
  Filled 2023-05-03 (×5): qty 1

## 2023-05-03 MED ORDER — HYDROMORPHONE HCL 1 MG/ML IJ SOLN: 0.5000 mg | INTRAMUSCULAR | Status: DC | PRN

## 2023-05-03 MED ORDER — ONDANSETRON HCL 4 MG/2ML IJ SOLN: 4.0000 mg | Freq: Four times a day (QID) | INTRAMUSCULAR | Status: DC | PRN

## 2023-05-03 MED ORDER — RANOLAZINE ER 500 MG PO TB12
500.0000 mg | ORAL_TABLET | Freq: Two times a day (BID) | ORAL | Status: DC
Start: 2023-05-03 — End: 2023-05-07
  Administered 2023-05-03 – 2023-05-07 (×9): 500 mg via ORAL
  Filled 2023-05-03 (×9): qty 1

## 2023-05-03 MED ORDER — ONDANSETRON HCL 4 MG PO TABS: 4.0000 mg | ORAL_TABLET | Freq: Four times a day (QID) | ORAL | Status: DC | PRN

## 2023-05-03 MED ORDER — TAMSULOSIN HCL 0.4 MG PO CAPS
0.4000 mg | ORAL_CAPSULE | Freq: Every day | ORAL | Status: DC
Start: 2023-05-03 — End: 2023-05-07
  Administered 2023-05-03 – 2023-05-06 (×4): 0.4 mg via ORAL
  Filled 2023-05-03 (×4): qty 1

## 2023-05-03 MED ORDER — METOPROLOL TARTRATE 50 MG PO TABS
50.0000 mg | ORAL_TABLET | Freq: Two times a day (BID) | ORAL | Status: DC
  Administered 2023-05-03 – 2023-05-07 (×9): 50 mg via ORAL
  Filled 2023-05-03: qty 1
  Filled 2023-05-03: qty 2
  Filled 2023-05-03 (×7): qty 1

## 2023-05-03 MED ORDER — CHLORHEXIDINE GLUCONATE CLOTH 2 % EX PADS
6.0000 | MEDICATED_PAD | Freq: Every day | CUTANEOUS | Status: DC
  Administered 2023-05-03 – 2023-05-07 (×5): 6 via TOPICAL

## 2023-05-03 MED ORDER — RIVAROXABAN 10 MG PO TABS
20.0000 mg | ORAL_TABLET | Freq: Every day | ORAL | Status: DC
  Administered 2023-05-03 – 2023-05-06 (×4): 20 mg via ORAL
  Filled 2023-05-03 (×4): qty 2

## 2023-05-03 MED ORDER — SENNOSIDES-DOCUSATE SODIUM 8.6-50 MG PO TABS: 1.0000 | ORAL_TABLET | Freq: Every evening | ORAL | Status: DC | PRN

## 2023-05-03 NOTE — Progress Notes (Signed)
LLE venous duplex has been completed.   Results can be found under chart review under CV PROC. 05/03/2023 11:18 AM Lynnae Ludemann RVT, RDMS

## 2023-05-03 NOTE — ED Provider Notes (Signed)
[place] EMERGENCY DEPARTMENT AT [facility] Provider Note   CSN: [id number] Arrival date & time: 05/02/23  2110     History  Chief Complaint  Patient presents with   Flank Pain    Robert Burns is a 83 y.o. male.  The history is provided by the patient.  Flank Pain  He has history of hypertension, diabetes, coronary artery disease, chronic lymphocytic leukemia and comes in because of flank pain which has been getting worse over the last week.  Pain started out of the left flank and now is present on both sides.  He denies fever, chills, sweats.  He denies any nausea or vomiting.  He does have an indwelling Foley catheter and urine intermittently gets cloudy and a clear.   Home Medications Prior to Admission medications   Medication Sig Start Date End Date Taking? Authorizing Provider  atorvastatin (LIPITOR) 80 MG tablet Take 1 tablet (80 mg total) by mouth daily at 6 PM. 02/09/18   Janetta Hora, PA-C  Cholecalciferol (VITAMIN D) 50 MCG (2000 UT) CAPS Take 1 capsule by mouth 2 (two) times daily.    [provider]  finasteride (PROSCAR) 5 MG tablet Take 5 mg by mouth daily.    [provider]  glipiZIDE (GLUCOTROL) 5 MG tablet Take 5 mg by mouth daily with breakfast.  01/10/18   [provider]  isosorbide mononitrate (IMDUR) 60 MG 24 hr tablet Take 60 mg by mouth daily.    [provider]  JARDIANCE 10 MG TABS tablet Take 1 tablet (10 mg total) by mouth daily. 05/01/23   Jodelle Gross, NP  metFORMIN (GLUCOPHAGE) 1000 MG tablet Take 1 tablet (1,000 mg total) by mouth 2 (two) times daily with a meal. Restart on 3/18. 06/23/20   [facility], Roe Rutherford, PA  metoprolol tartrate (LOPRESSOR) 50 MG tablet Take 50 mg by mouth 2 (two) times daily.    [provider]  Multiple Vitamin-Folic Acid TABS Take 1 tablet by mouth daily. 01/29/23   Runell Gess, MD  nitroGLYCERIN (NITROSTAT) 0.4 MG SL tablet Place 1 tablet (0.4 mg  total) under the tongue every 5 (five) minutes as needed for chest pain. 11/21/22   Jodelle Gross, NP  ranolazine (RANEXA) 500 MG 12 hr tablet Take 500 mg by mouth 2 (two) times daily.    [provider]  rivaroxaban (XARELTO) 20 MG TABS tablet Take 1 tablet (20 mg total) by mouth daily with supper. 05/01/23   Jodelle Gross, NP  silodosin (RAPAFLO) 8 MG CAPS capsule Take 8 mg by mouth daily.    [provider]  tamsulosin (FLOMAX) 0.4 MG CAPS capsule Take 0.4 mg by mouth daily.    [provider]      Allergies    Peanut-containing drug products, Ace inhibitors, Diltiazem hcl, Meloxicam, Doxycycline, Eliquis [apixaban], Sertraline hcl, Carvedilol, Clonidine hcl, and Duloxetine hcl    Review of Systems   Review of Systems  Genitourinary:  Positive for flank pain.  All other systems reviewed and are negative.   Physical Exam Updated Vital Signs BP 134/64 (BP Location: Left Arm)   Pulse 61   Temp 97.6 F (36.4 C) (Oral)   Resp 18   Ht 5\' 7"  (1.702 m)   Wt 74.4 kg   SpO2 100%   BMI 25.69 kg/m  Physical Exam Vitals and nursing note reviewed.   83 year old male, resting comfortably and in no acute distress. Vital signs are  normal. Oxygen saturation is 100%, which is normal. Head is normocephalic and atraumatic. PERRLA, EOMI.  Back is nontender in the midline.  There is bilateral CVA tenderness, worse on the right. Lungs are clear without rales, wheezes, or rhonchi. Chest is nontender. Heart has regular rate and rhythm without murmur. Abdomen is soft, flat, with mild suprapubic tenderness. Extremities have 1+ edema, full range of motion is present. Skin is warm and dry without rash. Neurologic: Mental status is normal, moves all extremities equally.  ED Results / Procedures / Treatments   Labs (all labs ordered are listed, but only abnormal results are displayed) Labs Reviewed  COMPREHENSIVE METABOLIC PANEL - Abnormal; Notable for the  following components:      Result Value   CO2 20 (*)    Glucose, Bld 129 (*)    BUN 26 (*)    All other components within normal limits  CBC - Abnormal; Notable for the following components:   WBC 28.8 (*)    Hemoglobin 12.0 (*)    HCT 38.4 (*)    RDW 16.5 (*)    All other components within normal limits  LIPASE, BLOOD  URINALYSIS, ROUTINE W REFLEX MICROSCOPIC    EKG DACORION, ASMAR GN:[id number] 03-May-2023 01:53:11 [facility] ROUTINE RECORD [birth year] (32 yr) Male Caucasian Room:[facility]19 Loc:0 Technician: [id number] Test ind: Vent. rate 61 BPM PR interval 179 ms QRS duration 130 ms QT/QTcB 417/420 ms P-R-T axes 99 99 24 Incomplete analysis due to missing data in precordial lead(s) ATRIAL PACED RHYTHM Nonspecific intraventricular conduction delay Anterior infarct, old Minimal ST depression, inferior leads Missing lead(s): V5 When compared with ECG of 01/02/2023, No significant change was found Confirmed by Dione Booze ([id number]) on 05/03/2023 2:02:38 AM Confirmed By: Dione Booze  Radiology No results found.  Procedures Procedures    Medications Ordered in ED Medications - No data to display  ED Course/ Medical Decision Making/ A&P                                 Medical Decision Making Amount and/or Complexity of Data Reviewed Labs: ordered. Radiology: ordered.  Risk Prescription drug management. Decision regarding hospitalization.   Bilateral flank pain in patient with chronic indwelling Foley catheter concerning for UTI, consider urolithiasis.  Doubt abdominal aortic aneurysm.  I reviewed his past records, and note on 01/18/2023 he had CT angiogram of abdomen and pelvis showing no evidence of abdominal aneurysm, no evidence of nephrolithiasis.  I have reviewed his laboratory tests, and my interpretation is elevated BUN which has actually decreased compared with yesterday, stable creatinine, stable anemia, leukocytosis which is increased  significantly compared with yesterday.  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  Today WBC is 28.8 concerning for possible infection.  Urinalysis is pending but I strongly suspect he has a urinary tract infection.  I have ordered lactic acid level and the renal stone protocol CT scan.  Lactic acid level has come back slightly elevated at 3.0 and I have ordered orally, goal-directed fluids.  CT scan shows bladder wall thickening with radiologist stating is in keeping with chronic outlet obstruction.  I have independently viewed the images, and I am concerned that the thickened bladder wall actually represents UTI.  Urinalysis is consistent with UTI with greater than 50 RBCs and greater than 50 WBCs with WBC clumps, positive nitrite.  I have ordered ceftriaxone to  treat UTI, repeat lactic acid level is pending.  Of note, he remains hemodynamically stable with no fever and no tachycardia.  WBC differential is mainly lymphocytes, therefore I do not feel his leukocytosis is indicative of sepsis.  Repeat lactic acid level is pending.  I have discussed the case with Dr. Haroldine Laws of [facility], who agrees to admit the patient.  CRITICAL CARE Performed by: Dione Booze Total critical care time: 60 minutes Critical care time was exclusive of separately billable procedures and treating other patients. Critical care was necessary to treat or prevent imminent or life-threatening deterioration. Critical care was time spent personally by me on the following activities: development of treatment plan with patient and/or surrogate as well as nursing, discussions with consultants, evaluation of patient's response to treatment, examination of patient, obtaining history from patient or surrogate, ordering and performing treatments and interventions, ordering and review of laboratory studies, ordering and review of radiographic studies, pulse oximetry and re-evaluation of  patient's condition.  Final Clinical Impression(s) / ED Diagnoses Final diagnoses:  Urinary tract infection with hematuria, site unspecified  Elevated lactic acid level  Leukocytosis, unspecified type    Rx / DC Orders ED Discharge Orders     None         Dione Booze, MD 05/03/23 [id number]

## 2023-05-03 NOTE — Progress Notes (Signed)
PROGRESS NOTE    Robert Burns  ZOX:[id number] DOB: 22-Apr-1940 DOA: 05/02/2023 PCP: Thana Ates, MD    Chief Complaint  Patient presents with   Flank Pain    Brief Narrative:    This is a no charge note as patient was seen and admitted earlier today, patient was seen and examined, chart, imaging and labs were reviewed.  This is a 83 year old male with past medical history of CLL, HTN, HLD, BPH [with chronic indwelling Foley catheter, OSA on CPAP, PAD, T2DM.  Patient presents with complaint of back pain for the last week or so.  Initially it was on the left, has been persistent and progressive, now moved to the right.  Pain is constant.  He denies nausea, vomiting, fevers, chills.  Patient has a chronic indwelling Foley catheter.  The Foley changed monthly at the urologist office.  It is down due for change.  He denies increased smells or sedimentation.  He states is impossible for him to find a comfortable position due to the severity of the pain on the left side   In the ER vital stable.  Lactic acid 3.0.  WBC 28.8, yesterday 19.8.  CT renal protocol with no acute intra-abdominal difficult only for extensive pathologic adenopathy in the abdomen and pelvis, but no acute findings in kidneys, unknown renal bladder wall thickening in the setting of chronic outlet obstruction, urinalysis consistent with infection, admitted for further treatment.    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  Blood cultures x 2 ordered.  -Continue with IV Rocephin -Follow on urine cultures -Foley catheter exchanged in ED -Foley changed ordered with repeat UA -Pain meds as needed ordered -Proscar and Flomax resumed    Left lower extremity swelling -less likely DVT as patient on Xarelto.  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 Leonides Schanz, imaging significant for extensive lymphadenopathy in abdomen and pelvis    Dyslipidemia (high LDL; low HDL) -Atorvastatin resumed    PAF (paroxysmal atrial fibrillation) (HCC) -Xarelto, metoprolol resumed    Peripheral arterial disease (HCC) -Xarelto, atorvastatin resumed    T2DM -Sliding scale insulin ordered    DVT prophylaxis: On Xarelto Code Status: Full Family Communication: disussed in details with the patient Disposition:   Status is: Inpatient    Consultants:  None  Subjective:  Patient reports pain much improved, but he just received Dilaudid for that  Objective: Vitals:   05/03/23 0715 05/03/23 0959 05/03/23 1018 05/03/23 1143  BP: 138/61 (!) 147/64 (!) 146/64 136/63  Pulse: 62 62 61 67  Resp: 14  20   Temp:   98.1 F (36.7 C) (!) 97.5 F (36.4 C)  TempSrc:   Oral Oral  SpO2: 93%  93% 97%  Weight:      Height:  Intake/Output Summary (Last 24 hours) at 05/03/2023 1200 Last data filed at 05/03/2023 0533 Gross per 24 hour  Intake 102.83 ml  Output --  Net 102.83 ml   Filed Weights   05/02/23 2122  Weight: 74.4 kg    Examination:  Awake Alert, Oriented X 3, frail Symmetrical Chest wall movement, Good air movement bilaterally, CTAB RRR,No Gallops,Rubs or new Murmurs, No Parasternal Heave +ve B.Sounds, Abd Soft, No tenderness, No rebound - guarding or rigidity. No Cyanosis,  Clubbing, +1 edema, No new Rash or bruise       Data Reviewed: I have personally reviewed following labs and imaging studies  CBC: Recent Labs  Lab 05/01/23 1141 05/02/23 2147  WBC 19.8* 28.8*  NEUTROABS  --  4.0  HGB 11.7* 12.0*  HCT 38.4 38.4*  MCV 84 83.5  PLT 186 193    Basic Metabolic Panel: Recent Labs  Lab 05/01/23 1141 05/02/23 2147  NA 142 139  K 5.2 4.2  CL 104 104  CO2 22 20*  GLUCOSE 170* 129*  BUN 36* 26*  CREATININE 1.08 1.01  CALCIUM 10.2 9.7    GFR: Estimated Creatinine Clearance: 50.9 mL/min (by C-G formula based on SCr of 1.01 mg/dL).  Liver Function Tests: Recent Labs  Lab 05/02/23 2147  AST 29  ALT 22  ALKPHOS 63  BILITOT 0.9  PROT 6.8  ALBUMIN 4.2    CBG: Recent Labs  Lab 05/03/23 0843 05/03/23 1158  GLUCAP 99 154*     Recent Results (from the past 240 hours)  Urine Culture     Status: None (Preliminary result)   Collection Time: 05/03/23  5:19 AM   Specimen: Urine, Catheterized  Result Value Ref Range Status   Specimen Description URINE, CATHETERIZED  Final   Special Requests   Final    NONE Reflexed from [id number] Performed at [facility], [address] [place], [place] [address]    Culture PENDING  Incomplete   Report Status PENDING  Incomplete         Radiology Studies: VAS [place] LOWER EXTREMITY VENOUS (DVT) Result Date: 05/03/2023  Lower Venous DVT Study Patient Name:  Robert Burns  Date of Exam:   05/03/2023 Medical Rec #: [id number]        Accession #:    [id number] Date of Birth: 03-11-1941        Patient Gender: M Patient Age:   12 years Exam Location:  [facility] Procedure:      VAS [place] LOWER EXTREMITY VENOUS (DVT) Referring Phys: DEBBY CROSLEY --------------------------------------------------------------------------------  Indications: Edema.  Anticoagulation: Xarelto. Comparison Study: No previous venous exams Performing Technologist: Jody Hill RVT, RDMS  Examination Guidelines: A complete  evaluation includes B-mode imaging, spectral Doppler, color Doppler, and power Doppler as needed of all accessible portions of each vessel. Bilateral testing is considered an integral part of a complete examination. Limited examinations for reoccurring indications may be performed as noted. The reflux portion of the exam is performed with the patient in reverse Trendelenburg.  +-----+---------------+---------+-----------+----------+--------------+ RIGHTCompressibilityPhasicitySpontaneityPropertiesThrombus Aging +-----+---------------+---------+-----------+----------+--------------+ CFV  Full           Yes      Yes                                 +-----+---------------+---------+-----------+----------+--------------+   +---------+---------------+---------+-----------+----------+--------------+ LEFT     CompressibilityPhasicitySpontaneityPropertiesThrombus Aging +---------+---------------+---------+-----------+----------+--------------+ CFV      Full  Yes      Yes                                 +---------+---------------+---------+-----------+----------+--------------+ SFJ      Full                                                        +---------+---------------+---------+-----------+----------+--------------+ FV Prox  Full           Yes      Yes                                 +---------+---------------+---------+-----------+----------+--------------+ FV Mid   Full           Yes      Yes                                 +---------+---------------+---------+-----------+----------+--------------+ FV DistalFull           Yes      Yes                                 +---------+---------------+---------+-----------+----------+--------------+ PFV      Full                                                        +---------+---------------+---------+-----------+----------+--------------+ POP      Full           Yes      Yes                                  +---------+---------------+---------+-----------+----------+--------------+ PTV      Full                                                        +---------+---------------+---------+-----------+----------+--------------+ PERO     Full                                                        +---------+---------------+---------+-----------+----------+--------------+     Summary: RIGHT: - No evidence of common femoral vein obstruction.   LEFT: - There is no evidence of deep vein thrombosis in the lower extremity.  - No cystic structure found in the popliteal fossa. Subcutaneous edema from knee to ankle.  *See table(s) above for measurements and observations.    Preliminary    CT Renal Stone Study Result Date: 05/03/2023 CLINICAL DATA:  Abdominal pain, flank pain bilaterally EXAM: CT ABDOMEN AND PELVIS WITHOUT CONTRAST TECHNIQUE: Multidetector CT imaging of the abdomen  and pelvis was performed following the standard protocol without IV contrast. RADIATION DOSE REDUCTION: This exam was performed according to the departmental dose-optimization program which includes automated exposure control, adjustment of the mA and/or kV according to patient size and/or use of iterative reconstruction technique. COMPARISON:  01/18/2023 FINDINGS: Lower chest: No acute abnormality. Hepatobiliary: No focal liver abnormality is seen. No gallstones, gallbladder wall thickening, or biliary dilatation. Pancreas: Punctate calcifications are seen within the tail the pancreas, similar prior examination in keeping with changes of chronic pancreatitis in this location. No superimposed acute peripancreatic inflammatory changes are identified. No peripancreatic fluid collections. Spleen: Unremarkable Adrenals/Urinary Tract: The adrenal glands are. Kidneys are normal size and position simple cortical cyst arises from the anterior interpolar region kidney for which no follow-up imaging is recommended. Additional tiny simple  cyst arises from the posterior interpolar region of the left kidney. Kidneys are otherwise unremarkable. The bladder is thick walled, in keeping with changes of chronic outlet obstruction, and is decompressed with a Foley catheter balloon seen within its lumen Stomach/Bowel: Moderate colonic stool burden. Moderate sigmoid diverticulosis. The stomach, small bowel, large bowel are otherwise unremarkable. Appendix normal. Vascular/Lymphatic: Extensive aortoiliac atherosclerotic calcification. No aortic aneurysm. Extensive pathologic adenopathy is again seen within the periportal, mesenteric, aortocaval, left periaortic, common iliac, and external iliac lymph node groups most in keeping with a lymphoproliferative process such as lymphoma or leukemia. Index lymph node measures 3.4 cm in short axis diameter within the periportal lymph node group. Reproductive: Marked prostatic hypertrophy. Other: No abdominal wall hernia Musculoskeletal: No acute bone abnormality. No lytic or blastic bone lesion. Degenerative changes are seen within the thoracolumbar spine. IMPRESSION: 1. No acute intra-abdominal pathology identified. 2. Extensive pathologic adenopathy within the abdomen and pelvis most in keeping with a lymphoproliferative process such as lymphoma or leukemia. If indicated, PET CT examination would be helpful for further evaluation and to direct biopsy strategy. 3. Marked prostatic hypertrophy. Bladder wall thickening, in keeping with changes of chronic outlet obstruction. 4. Moderate colonic stool burden. 5. Moderate sigmoid diverticulosis. Aortic Atherosclerosis (ICD10-I70.0). Electronically Signed   By: Helyn Numbers M.D.   On: 05/03/2023 03:29        Scheduled Meds:  atorvastatin  80 mg Oral q1800   Chlorhexidine Gluconate Cloth  6 each Topical Daily   finasteride  5 mg Oral Daily   insulin aspart  0-15 Units Subcutaneous TID WC   insulin aspart  0-5 Units Subcutaneous QHS   isosorbide mononitrate  60  mg Oral Daily   metoprolol tartrate  50 mg Oral BID   polyethylene glycol  17 g Oral BID   ranolazine  500 mg Oral BID   rivaroxaban  20 mg Oral Q supper   tamsulosin  0.4 mg Oral QPC supper   Continuous Infusions:  cefTRIAXone (ROCEPHIN)  IV       LOS: 0 days     Huey Bienenstock, MD [facility]   To contact the attending provider between 7A-7P or the covering provider during after hours 7P-7A, please log into the web site [email or web address] and access using universal [place] password for that web site. If you do not have the password, please call the hospital operator.  05/03/2023, 12:00 PM

## 2023-05-03 NOTE — Telephone Encounter (Signed)
 [name]

## 2023-05-03 NOTE — Plan of Care (Signed)
  Problem: Education: Goal: Ability to describe self-care measures that may prevent or decrease complications (Diabetes Survival Skills Education) will improve Outcome: Progressing   Problem: Education: Goal: Knowledge of General Education information will improve Description: Including pain rating scale, medication(s)/side effects and non-pharmacologic comfort measures Outcome: Progressing

## 2023-05-03 NOTE — Progress Notes (Signed)
   05/03/23 2230  BiPAP/CPAP/SIPAP  BiPAP/CPAP/SIPAP Pt Type Adult  BiPAP/CPAP/SIPAP Resmed (Pts. own unit.)  Mask Type Full face mask (Pts, mask from home.)  Respiratory Rate 18 breaths/min  FiO2 (%) 21 %  Flow Rate 0 lpm  Patient Home Equipment Yes  CPAP/SIPAP surface wiped down Yes   Pt. using own CPAP/BiPAP unit from home, remains on room air, able to place on independently, made aware to notify in needed.

## 2023-05-03 NOTE — H&P (Signed)
PCP:   Thana Ates, MD   Chief Complaint:  Flank pain B/L  HPI: This is a 83 year old male with past medical history of CLL, HTN, HLD, BPH [with chronic indwelling Foley catheter], OSA on CPAP, PAD, T2DM.  Patient presents with complaint of back pain for the last week or so.  Initially it was on the left, has been persistent and progressive, now moved to the right.  Pain is constant.  He denies nausea, vomiting, fevers, chills.  Patient has a chronic indwelling Foley catheter.  The Foley changed monthly at the urologist office.  It is down due for change.  He denies increased smells or sedimentation.  He states is impossible for him to find a comfortable position due to the severity of the pain on the left side  In the ER vital stable.  Lactic acid 3.0.  WBC 28.8, yesterday 19.8.  CT abdomen pelvis unrevealing.  Kidneys normal.  Significant adenopathy related to lymphoma/leukemia.  Urinalysis consistent with infection.  Review of Systems:  Per HPI  Past Medical History: Past Medical History:  Diagnosis Date   Anemia    Anxiety    Arthritis    "all over" (11/25/2015)   CAD (coronary artery disease)    a. s/p CABG  06/2002; b. 02/01/15 PCI: DES to prox SVG to PDA, staged PCI of SVG to Diag in 02/2015; c. 04/2017 Cath/PCI: LM nl, LAD 100ost, 30m, 75d, LCX 60ost, OM2 80, RCA 100ost, RPDA 80, LIMA->LAD ok, VG->D1 patent stent, VG->RPDA patent stent, 50p, VG->OM1->OM2 90p (3.0x24 Synergy DES), 100 between OM1->OM2 (med rx).   Cancer [facility])    Right Shoulder, Left Leg- BCC, SCC, AND MELANOMA   Colon polyp    Elevated cholesterol    Epithelioid hemangioendothelioma    s/p resection of left SFA/mass with interposition 6 mm GoreTex graft 06/02/10, s/p repeat resection for positive margins 09/22/10   High blood pressure    Hyperlipidemia    Hypertension    Leg pain    OSA on CPAP    PAD (peripheral artery disease) (HCC)    a. 10/2002 L SFA PTA/BMS; b. 8/17 LE Angio: LEIA 90 (9x40 self exp stent),  LSFA short segment prox occlusion (staged PTA/stenting 01/03/2016), patent mid stent, RSFA 71m (staged PTA/DEB 02/14/2016).   Presence of permanent cardiac pacemaker    Medtronic   Renal insufficiency 12/18/2022   SSS (sick sinus syndrome) (HCC)    a. s/p PPM in 2007 with gen change 04/2015 - Medtronic Adapta ADDRL1, ser # [id number] H.   Type II diabetes mellitus (HCC)    Type II   Urgency of urination    Past Surgical History:  Procedure Laterality Date   CARDIAC CATHETERIZATION N/A 02/01/2015   Procedure: Left Heart Cath and Coronary Angiography;  Surgeon: Runell Gess, MD;  Location: [facility] INVASIVE CV LAB;  Service: Cardiovascular;  Laterality: N/A;   CARDIAC CATHETERIZATION N/A 02/01/2015   Procedure: Coronary Stent Intervention;  Surgeon: Runell Gess, MD;  Location: [facility] INVASIVE CV LAB;  Service: Cardiovascular;  Laterality: N/A;   CARDIAC CATHETERIZATION  06/2002   "just before bypass OR"   CARDIAC CATHETERIZATION N/A 03/01/2015   Procedure: Coronary Stent Intervention;  Surgeon: Runell Gess, MD;  Location: [facility] INVASIVE CV LAB;  Service: Cardiovascular;  Laterality: N/A;   CARDIAC CATHETERIZATION  02/06/2018   COLONOSCOPY     CORONARY ANGIOPLASTY     CORONARY ARTERY BYPASS GRAFT  06/2002   x5, LIMA-LAD;VG- Diag; seq VG- ramus & OM branch;  VG-PDA   CORONARY STENT INTERVENTION N/A 04/12/2017   Procedure: CORONARY STENT INTERVENTION;  Surgeon: Runell Gess, MD;  Location: [facility] INVASIVE CV LAB;  Service: Cardiovascular;  Laterality: N/A;   CORONARY STENT INTERVENTION N/A 02/08/2018   Procedure: CORONARY STENT INTERVENTION;  Surgeon: Corky Crafts, MD;  Location: [facility] INVASIVE CV LAB;  Service: Cardiovascular;  Laterality: N/A;   CORONARY STENT INTERVENTION N/A 11/12/2018   Procedure: CORONARY STENT INTERVENTION;  Surgeon: Iran Ouch, MD;  Location: [facility] INVASIVE CV LAB;  Service: Cardiovascular;  Laterality: N/A;   CORONARY STENT INTERVENTION N/A 06/22/2020   Procedure:  CORONARY STENT INTERVENTION;  Surgeon: Corky Crafts, MD;  Location: [facility] INVASIVE CV LAB;  Service: Cardiovascular;  Laterality: N/A;   EP IMPLANTABLE DEVICE N/A 04/23/2015   Procedure: PPM Generator Changeout;  Surgeon: Thurmon Fair, MD;  Location: [facility] INVASIVE CV LAB;  Service: Cardiovascular;  Laterality: N/A;   FALSE ANEURYSM REPAIR Left 11/29/2018   Procedure: REPAIR FALSE ANEURYSM LEFT RADIAL ARTERY;  Surgeon: Larina Earthly, MD;  Location: [facility] OR;  Service: Vascular;  Laterality: Left;   FEMORAL ARTERY STENT Left ~ 2014   "taken out of my leg; couldn' catorgorize what kind so the put it under all 3"; cataroziepitheloid hemanioendotheliomau   FOOT FRACTURE SURGERY Left 1973   FRACTURE SURGERY     INSERT / REPLACE / REMOVE PACEMAKER  10/13/05   right side, medtronic Adapta   IR 3D INDEPENDENT WKST  02/26/2023   IR ANGIOGRAM PELVIS SELECTIVE OR SUPRASELECTIVE  02/26/2023   IR ANGIOGRAM SELECTIVE EACH ADDITIONAL VESSEL  02/26/2023   IR ANGIOGRAM SELECTIVE EACH ADDITIONAL VESSEL  02/26/2023   IR EMBO TUMOR ORGAN ISCHEMIA INFARCT INC GUIDE ROADMAPPING  02/26/2023   IR RADIOLOGIST EVAL & MGMT  02/02/2023   IR US GUIDE VASC ACCESS LEFT  02/26/2023   IR US GUIDE VASC ACCESS LEFT  02/26/2023   IR US GUIDE VASC ACCESS LEFT  02/26/2023   KNEE HARDWARE REMOVAL Right 1950's   "3-4 months after the insertion"   KNEE SURGERY Right 1950's   "broke my lower leg; had to put pin in my knee to keep lower leg in place til it healed"   LEFT HEART CATH AND CORS/GRAFTS ANGIOGRAPHY N/A 04/12/2017   Procedure: LEFT HEART CATH AND CORS/GRAFTS ANGIOGRAPHY;  Surgeon: Runell Gess, MD;  Location: [facility] INVASIVE CV LAB;  Service: Cardiovascular;  Laterality: N/A;   LEFT HEART CATH AND CORS/GRAFTS ANGIOGRAPHY N/A 02/06/2018   Procedure: LEFT HEART CATH AND CORS/GRAFTS ANGIOGRAPHY;  Surgeon: Lennette Bihari, MD;  Location: [facility] INVASIVE CV LAB;  Service: Cardiovascular;  Laterality: N/A;   LEFT HEART CATH AND  CORS/GRAFTS ANGIOGRAPHY N/A 11/12/2018   Procedure: LEFT HEART CATH AND CORS/GRAFTS ANGIOGRAPHY;  Surgeon: Iran Ouch, MD;  Location: [facility] INVASIVE CV LAB;  Service: Cardiovascular;  Laterality: N/A;   LEFT HEART CATH AND CORS/GRAFTS ANGIOGRAPHY N/A 06/22/2020   Procedure: LEFT HEART CATH AND CORS/GRAFTS ANGIOGRAPHY;  Surgeon: Corky Crafts, MD;  Location: [facility] INVASIVE CV LAB;  Service: Cardiovascular;  Laterality: N/A;   LEFT HEART CATH AND CORS/GRAFTS ANGIOGRAPHY N/A 12/01/2022   Procedure: LEFT HEART CATH AND CORS/GRAFTS ANGIOGRAPHY;  Surgeon: [place], Peter M, MD;  Location: [facility] INVASIVE CV LAB;  Service: Cardiovascular;  Laterality: N/A;   PERIPHERAL VASCULAR CATHETERIZATION N/A 11/25/2015   Procedure: Lower Extremity Angiography;  Surgeon: Runell Gess, MD;  Location: [facility] INVASIVE CV LAB;  Service: Cardiovascular;  Laterality: N/A;   PERIPHERAL VASCULAR CATHETERIZATION Left  11/25/2015   Procedure: Peripheral Vascular Intervention;  Surgeon: Runell Gess, MD;  Location: [facility] INVASIVE CV LAB;  Service: Cardiovascular;  Laterality: Left;  external iliac   PERIPHERAL VASCULAR CATHETERIZATION N/A 01/03/2016   Procedure: Lower Extremity Angiography;  Surgeon: Runell Gess, MD;  Location: [facility] INVASIVE CV LAB;  Service: Cardiovascular;  Laterality: N/A;   PERIPHERAL VASCULAR CATHETERIZATION Left 01/03/2016   Procedure: Peripheral Vascular Intervention;  Surgeon: Runell Gess, MD;  Location: [facility] INVASIVE CV LAB;  Service: Cardiovascular;  Laterality: Left;  SFA   PERIPHERAL VASCULAR CATHETERIZATION Right 02/14/2016   Procedure: Peripheral Vascular Atherectomy;  Surgeon: Runell Gess, MD;  Location: [facility] INVASIVE CV LAB;  Service: Cardiovascular;  Laterality: Right;  SFA   POPLITEAL ARTERY STENT  01/03/2016   Contralateral access with a 7 [name] crossover sheath (second order catheter placement)   TONSILLECTOMY AND ADENOIDECTOMY     TUMOR EXCISION Right ~ 2005   cancerous tumor removed  from shoulder   TUMOR EXCISION Right ~ 2000   benign tumor removed from under shoulder   TUMOR EXCISION Left 06/02/2010   resection of Lt SFA wth interposition of Gore-Tex graft    Medications: Prior to Admission medications   Medication Sig Start Date End Date Taking? Authorizing Provider  atorvastatin (LIPITOR) 80 MG tablet Take 1 tablet (80 mg total) by mouth daily at 6 PM. 02/09/18   Janetta Hora, PA-C  Cholecalciferol (VITAMIN D) 50 MCG (2000 UT) CAPS Take 1 capsule by mouth 2 (two) times daily.    [provider]  finasteride (PROSCAR) 5 MG tablet Take 5 mg by mouth daily.    [provider]  glipiZIDE (GLUCOTROL) 5 MG tablet Take 5 mg by mouth daily with breakfast.  01/10/18   [provider]  isosorbide mononitrate (IMDUR) 60 MG 24 hr tablet Take 60 mg by mouth daily.    [provider]  JARDIANCE 10 MG TABS tablet Take 1 tablet (10 mg total) by mouth daily. 05/01/23   Jodelle Gross, NP  metFORMIN (GLUCOPHAGE) 1000 MG tablet Take 1 tablet (1,000 mg total) by mouth 2 (two) times daily with a meal. Restart on 3/18. 06/23/20   [facility], Roe Rutherford, PA  metoprolol tartrate (LOPRESSOR) 50 MG tablet Take 50 mg by mouth 2 (two) times daily.    [provider]  Multiple Vitamin-Folic Acid TABS Take 1 tablet by mouth daily. 01/29/23   Runell Gess, MD  nitroGLYCERIN (NITROSTAT) 0.4 MG SL tablet Place 1 tablet (0.4 mg total) under the tongue every 5 (five) minutes as needed for chest pain. 11/21/22   Jodelle Gross, NP  ranolazine (RANEXA) 500 MG 12 hr tablet Take 500 mg by mouth 2 (two) times daily.    [provider]  rivaroxaban (XARELTO) 20 MG TABS tablet Take 1 tablet (20 mg total) by mouth daily with supper. 05/01/23   Jodelle Gross, NP  silodosin (RAPAFLO) 8 MG CAPS capsule Take 8 mg by mouth daily.    [provider]  tamsulosin (FLOMAX) 0.4 MG CAPS capsule Take 0.4 mg by mouth daily.    [provider]    Allergies:   Allergies  Allergen Reactions   Peanut-Containing Drug Products Anaphylaxis and Other (See Comments)    Tongue swelling is severe    Ace Inhibitors Cough        Diltiazem Hcl Other (See Comments)    UNSPECIFIED REACTION     Meloxicam Other (See Comments)  GI upset    Doxycycline Other (See Comments) and Cough    Reaction of cough and runny nose   Eliquis [Apixaban] Other (See Comments)    dizziness   Sertraline Hcl Other (See Comments)   Carvedilol Itching   Clonidine Hcl Other (See Comments)    Patch only - skin irritation   Duloxetine Hcl Other (See Comments)    Urinary frequency    Social History:  reports that he quit smoking about 52 years ago. His smoking use included pipe. He has never used smokeless tobacco. He reports that he does not drink alcohol and does not use drugs.  Family History: Family History  Problem Relation Age of Onset   Coronary artery disease Mother    Heart attack Mother    Hypertension Mother    Heart disease Mother        Open  Heart surgery   Diabetes Father    Heart disease Father    Hyperlipidemia Father    Heart attack Father    Hypertension Sister    Diabetes Sister    Heart disease Sister    Liver disease Neg Hx    Esophageal cancer Neg Hx    Colon cancer Neg Hx     Physical Exam: Vitals:   05/02/23 2123 05/03/23 0139 05/03/23 0400 05/03/23 0404  BP: (!) 145/84 134/64 (!) 129/58   Pulse: 82 61 60   Resp: 16 18 20    Temp: (!) 96.3 F (35.7 C) 97.6 F (36.4 C)  (!) 97.4 F (36.3 C)  TempSrc:  Oral  Oral  SpO2: 100% 100% 98%   Weight:      Height:        General:  A&O x3, well developed and nourished, no acute distress Eyes: Pink conjunctiva, no scleral icterus ENT: Moist oral mucosa, neck supple, no thyromegaly Lungs: CTA B/L, no wheeze, no crackles, no use of accessory muscles Cardiovascular: r RRR, no carotid bruits, no JVD.  PPM right chest wall Abdomen: soft, positive BS,  NTND, no organomegaly, not an acute abdomen GU: not examined Neuro: CN II - XII grossly intact, sensation intact Musculoskeletal: Moves all extremities.  RLE slightly larger than left.  Plus pitting edema right.  1+ left Skin: no rash, no subcutaneous crepitation, no decubitus Psych: appropriate patient   Labs on Admission:  Recent Labs    05/01/23 1141 05/02/23 2147  NA 142 139  K 5.2 4.2  CL 104 104  CO2 22 20*  GLUCOSE 170* 129*  BUN 36* 26*  CREATININE 1.08 1.01  CALCIUM 10.2 9.7   Recent Labs    05/02/23 2147  AST 29  ALT 22  ALKPHOS 63  BILITOT 0.9  PROT 6.8  ALBUMIN 4.2   Recent Labs    05/02/23 2147  LIPASE 33   Recent Labs    05/01/23 1141 05/02/23 2147  WBC 19.8* 28.8*  NEUTROABS  --  4.0  HGB 11.7* 12.0*  HCT 38.4 38.4*  MCV 84 83.5  PLT 186 193    Micro Results: Recent Results (from the past 240 hours)  Urine Culture     Status: None (Preliminary result)   Collection Time: 05/03/23  5:19 AM   Specimen: Urine, Catheterized  Result Value Ref Range Status   Specimen Description URINE, CATHETERIZED  Final   Special Requests   Final    NONE Reflexed from [id number] Performed at [facility], [address], [place], [place] [address]    Culture PENDING  Incomplete   Report Status PENDING  Incomplete     Radiological Exams on Admission: CT Renal Stone Study Result Date: 05/03/2023 CLINICAL DATA:  Abdominal pain, flank pain bilaterally EXAM: CT ABDOMEN AND PELVIS WITHOUT CONTRAST TECHNIQUE: Multidetector CT imaging of the abdomen and pelvis was performed following the standard protocol without IV contrast. RADIATION DOSE REDUCTION: This exam was performed according to the departmental dose-optimization program which includes automated exposure control, adjustment of the mA and/or kV according to patient size and/or use of iterative reconstruction technique. COMPARISON:  01/18/2023 FINDINGS: Lower chest: No acute abnormality. Hepatobiliary: No  focal liver abnormality is seen. No gallstones, gallbladder wall thickening, or biliary dilatation. Pancreas: Punctate calcifications are seen within the tail the pancreas, similar prior examination in keeping with changes of chronic pancreatitis in this location. No superimposed acute peripancreatic inflammatory changes are identified. No peripancreatic fluid collections. Spleen: Unremarkable Adrenals/Urinary Tract: The adrenal glands are. Kidneys are normal size and position simple cortical cyst arises from the anterior interpolar region kidney for which no follow-up imaging is recommended. Additional tiny simple cyst arises from the posterior interpolar region of the left kidney. Kidneys are otherwise unremarkable. The bladder is thick walled, in keeping with changes of chronic outlet obstruction, and is decompressed with a Foley catheter balloon seen within its lumen Stomach/Bowel: Moderate colonic stool burden. Moderate sigmoid diverticulosis. The stomach, small bowel, large bowel are otherwise unremarkable. Appendix normal. Vascular/Lymphatic: Extensive aortoiliac atherosclerotic calcification. No aortic aneurysm. Extensive pathologic adenopathy is again seen within the periportal, mesenteric, aortocaval, left periaortic, common iliac, and external iliac lymph node groups most in keeping with a lymphoproliferative process such as lymphoma or leukemia. Index lymph node measures 3.4 cm in short axis diameter within the periportal lymph node group. Reproductive: Marked prostatic hypertrophy. Other: No abdominal wall hernia Musculoskeletal: No acute bone abnormality. No lytic or blastic bone lesion. Degenerative changes are seen within the thoracolumbar spine. IMPRESSION: 1. No acute intra-abdominal pathology identified. 2. Extensive pathologic adenopathy within the abdomen and pelvis most in keeping with a lymphoproliferative process such as lymphoma or leukemia. If indicated, PET CT examination would be helpful  for further evaluation and to direct biopsy strategy. 3. Marked prostatic hypertrophy. Bladder wall thickening, in keeping with changes of chronic outlet obstruction. 4. Moderate colonic stool burden. 5. Moderate sigmoid diverticulosis. Aortic Atherosclerosis (ICD10-I70.0). Electronically Signed   By: Helyn Numbers M.D.   On: 05/03/2023 03:29    Assessment/Plan Present on Admission:  Complicated UTI (urinary tract infection) //  Chronic indwelling Foley catheter -Urinalysis collected.  Blood cultures x 2 ordered.  IV Rocephin given, continued. -Foley changed ordered with repeat UA -Pain meds as needed ordered -Proscar and Flomax resumed   Left lower extremity swelling -Doppler ordered but less likely DVT as patient on Xarelto.  Likely edema.  Lasix 40 mg x 1 ordered   CAD (coronary artery disease) of bypass graft//  HTN //  SSS -Imdur, metoprolol, Ranexa, Xarelto resumed -Pacemaker in place   CLL (chronic lymphocytic leukemia) (HCC) -Per oncologist   Dyslipidemia (high LDL; low HDL) -Atorvastatin resumed   PAF (paroxysmal atrial fibrillation) (HCC) -Xarelto, metoprolol resumed   Peripheral arterial disease (HCC) -Xarelto, atorvastatin resumed   T2DM -Sliding scale insulin ordered  Axavier Pressley 05/03/2023, 6:10 AM

## 2023-05-03 NOTE — Telephone Encounter (Signed)
Robert Burns, patient will be scheduled as soon as possible.  Auth Submission: NO AUTH NEEDED Site of care: Site of care: [facility] Payer: [employer] Medication & CPT/J Code(s) submitted: Venofer (Iron Sucrose) [id number] Route of submission (phone, fax, portal):  Phone # Fax # Auth type: Buy/Bill PB Units/visits requested: 200mg  x 5 doses Reference number:  Approval from: 05/03/23 to 10/01/23

## 2023-05-04 ENCOUNTER — Telehealth: Payer: Self-pay

## 2023-05-04 ENCOUNTER — Encounter: Payer: Self-pay | Admitting: Physician Assistant

## 2023-05-04 DIAGNOSIS — N390 Urinary tract infection, site not specified: Secondary | ICD-10-CM

## 2023-05-04 LAB — CBC WITH DIFFERENTIAL/PLATELET
Abs Immature Granulocytes: 0.04 10*3/uL (ref 0.00–0.07)
Basophils Absolute: 0 10*3/uL (ref 0.0–0.1)
Basophils Relative: 0 %
Eosinophils Absolute: 0.2 10*3/uL (ref 0.0–0.5)
Eosinophils Relative: 1 %
HCT: 31.7 % — ABNORMAL LOW (ref 39.0–52.0)
Hemoglobin: 10 g/dL — ABNORMAL LOW (ref 13.0–17.0)
Immature Granulocytes: 0 %
Lymphocytes Relative: 62 %
Lymphs Abs: 9.6 10*3/uL — ABNORMAL HIGH (ref 0.7–4.0)
MCH: 26.2 pg (ref 26.0–34.0)
MCHC: 31.5 g/dL (ref 30.0–36.0)
MCV: 83.2 fL (ref 80.0–100.0)
Monocytes Absolute: 0.9 10*3/uL (ref 0.1–1.0)
Monocytes Relative: 6 %
Neutro Abs: 4.7 10*3/uL (ref 1.7–7.7)
Neutrophils Relative %: 31 %
Platelet Morphology: NORMAL
Platelets: 155 10*3/uL (ref 150–400)
RBC: 3.81 MIL/uL — ABNORMAL LOW (ref 4.22–5.81)
RDW: 16.7 % — ABNORMAL HIGH (ref 11.5–15.5)
WBC Morphology: ABNORMAL
WBC: 15.4 10*3/uL — ABNORMAL HIGH (ref 4.0–10.5)
nRBC: 0 % (ref 0.0–0.2)

## 2023-05-04 LAB — BASIC METABOLIC PANEL
Anion gap: 10 (ref 5–15)
BUN: 28 mg/dL — ABNORMAL HIGH (ref 8–23)
CO2: 24 mmol/L (ref 22–32)
Calcium: 8.7 mg/dL — ABNORMAL LOW (ref 8.9–10.3)
Chloride: 105 mmol/L (ref 98–111)
Creatinine, Ser: 1.18 mg/dL (ref 0.61–1.24)
GFR, Estimated: >60 mL/min (ref >60)
Glucose, Bld: 145 mg/dL — ABNORMAL HIGH (ref 70–99)
Potassium: 3.5 mmol/L (ref 3.5–5.1)
Sodium: 139 mmol/L (ref 135–145)

## 2023-05-04 LAB — GLUCOSE, CAPILLARY
Glucose-Capillary: 140 mg/dL — ABNORMAL HIGH (ref 70–99)
Glucose-Capillary: 143 mg/dL — ABNORMAL HIGH (ref 70–99)
Glucose-Capillary: 143 mg/dL — ABNORMAL HIGH (ref 70–99)
Glucose-Capillary: 179 mg/dL — ABNORMAL HIGH (ref 70–99)

## 2023-05-04 NOTE — Inpatient Diabetes Management (Signed)
Inpatient Diabetes Program Recommendations  AACE/ADA: New Consensus Statement on Inpatient Glycemic Control (2015)  Target Ranges:  Prepandial:   less than 140 mg/dL      Peak postprandial:   less than 180 mg/dL (1-2 hours)      Critically ill patients:  140 - 180 mg/dL   Lab Results  Component Value Date   GLUCAP 143 (H) 05/04/2023   HGBA1C 6.6 (H) 11/30/2022    Review of Glycemic Control  Diabetes history: DM2 Outpatient Diabetes medications: Jardiance 10 mg daily, glipizide 5 mg daily, metformin 1000 mg BID Current orders for Inpatient glycemic control: Novolog 0-15 TID with meals and 0-5 HS  HgbA1C - 6.6% CBGs 145, 140, 143 mg/dL  Inpatient Diabetes Program Recommendations:    Answered questions regarding Dexcom G7 CGM. Pt thought he needed to be more strict with being in range all of the time. Explained how blood sugars fluctuate with food, stress, illness, coming into hospital, etc. Pt had a couple of low blood sugars and we discussed how to treat. Instructed pt that it was better to be a little high, than a little low. Long discussion regarding healthy diet, plate method, portion control and eating lean meat to boost his iron. Delightful patient and stressed to him that he was doing a great job with blood sugar control.   Continue to follow glucose trends.  Thank you. Ailene Ards, RD, LDN, CDCES Inpatient Diabetes Coordinator [phone]

## 2023-05-04 NOTE — Progress Notes (Signed)
PROGRESS NOTE Robert Burns  ZOX:[id number] DOB: 04-14-1940 DOA: 05/02/2023 PCP: Thana Ates, MD  Brief Narrative/Hospital Course: 83 yom w/ history of CLL, HTN, HLD, BPH [with chronic indwelling Foley catheter, OSA on CPAP, PAD, T2DM , presents with complaints of abdominal pain, workup significant for UTI, Foley catheter exchanged in ED. The Foley is changed monthly at the urologist office.  In the ED vitals were stable, labs with significant leukocytosis 19>22k, stable renal function blood culture urine culture obtained, CT renal protocol with no acute intra-abdominal difficult only for extensive pathologic adenopathy in the abdomen and pelvis, but no acute findings in kidneys, unknown renal bladder wall thickening in the setting of chronic outlet obstruction.     Subjective: Patient examined this morning Complains of some pain overall feels better Overnight afebrile vitals stable, Shows improving WBC count  Assessment and Plan: Principal Problem:   Complicated UTI (urinary tract infection) Active Problems:   CAD (coronary artery disease) of bypass graft   CLL (chronic lymphocytic leukemia) (HCC)   Essential hypertension   SSS (sick sinus syndrome), medtronic adapta   PAF (paroxysmal atrial fibrillation) (HCC)   OSA on CPAP   Dyslipidemia (high LDL; low HDL)   Peripheral arterial disease (HCC)   Hypercholesterolemia   Chronic anticoagulation     Complicated UTI due to chronic indwelling Foley catheter: Labs showed leukocytosis UA grossly abnormal, Foley catheter exchanged in the ED, was due for monthly exchange. Follow-up urine and blood culture.  Leukocytosis is downtrending nicely,continue ceftriaxone.  Continue home Proscar Flomax pain management. Recent Labs  Lab 05/01/23 1141 05/02/23 2147 05/03/23 0315 05/03/23 0811 05/04/23 0418  WBC 19.8* 28.8*  --   --  15.4*  LATICACIDVEN  --   --  3.0* 2.5*  --    Left lower extremity swelling Duplex negative for DVT.  CAD  s/p bypass graft Hypertension SSS HLD PAD: Stable currently.  Continue home Imdur and metoprolol Ranexa Xarelto and atorvastatin.  Pacemaker in place.  CLL: Followed by outpatient oncology continues outpatient care with Dr. Leonides Schanz, imaging significant for extensive lymphadenopathy in abdomen and pelvis  PAF: Rate controlled.  Continue Xarelto and metoprolol.  T2DM: Blood sugar fairly controlled.  Continue SSI  Recent Labs  Lab 05/03/23 0843 05/03/23 1158 05/03/23 1557 05/03/23 2112 05/04/23 0728  GLUCAP 99 154* 163* 214* 140*     DVT prophylaxis: rivaroxaban (XARELTO) tablet 20 mg Start: 05/03/23 1700 Code Status:   Code Status: Full Code Family Communication: plan of care discussed with patient at bedside. Patient status is: Remains hospitalized because of severity of illness Level of care: Med-Surg   Dispo: The patient is from: Home with family            Anticipated disposition: TBD Objective: Vitals last 24 hrs: Vitals:   05/03/23 2104 05/04/23 0306 05/04/23 0624 05/04/23 1001  BP: (!) 125/56 130/68 119/62   Pulse: 68 (!) 59 (!) 54 80  Resp: 18 17 18    Temp: 98 F (36.7 C) 97.6 F (36.4 C) 97.7 F (36.5 C)   TempSrc: Oral Oral    SpO2: 98% 99% 98%   Weight:      Height:       Weight change:   Physical Examination: General exam: alert awake,at baseline, older than stated age HEENT:Oral mucosa moist, Ear/Nose WNL grossly Respiratory system: Bilaterally clear BS,no use of accessory muscle Cardiovascular system: S1 & S2 +, No JVD. Gastrointestinal system: Abdomen soft,NT,ND, BS+ Nervous System: Alert, awake, moving all extremities,and following  commands. Extremities: LE edema neg,distal peripheral pulses palpable and warm.  Skin: No rashes,no icterus. MSK: Normal muscle bulk,tone, power  Foley+  Medications reviewed:  Scheduled Meds:  atorvastatin  80 mg Oral q1800   Chlorhexidine Gluconate Cloth  6 each Topical Daily   finasteride  5 mg Oral Daily    insulin aspart  0-15 Units Subcutaneous TID WC   insulin aspart  0-5 Units Subcutaneous QHS   isosorbide mononitrate  60 mg Oral Daily   metoprolol tartrate  50 mg Oral BID   polyethylene glycol  17 g Oral BID   ranolazine  500 mg Oral BID   rivaroxaban  20 mg Oral Q supper   tamsulosin  0.4 mg Oral QPC supper   Continuous Infusions:  cefTRIAXone (ROCEPHIN)  IV Stopped (05/03/23 2139)      Diet Order             Diet heart healthy/carb modified Room service appropriate? Yes; Fluid consistency: Thin  Diet effective now                  Intake/Output Summary (Last 24 hours) at 05/04/2023 1145 Last data filed at 05/04/2023 1000 Gross per 24 hour  Intake 1360 ml  Output 3700 ml  Net -2340 ml   Net IO Since Admission: -2,237.17 mL [05/04/23 1145]  Wt Readings from Last 3 Encounters:  05/02/23 74.4 kg  05/01/23 74.4 kg  02/26/23 72.1 kg     Unresulted Labs (From admission, onward)    None     Data Reviewed: I have personally reviewed following labs and imaging studies CBC: Recent Labs  Lab 05/01/23 1141 05/02/23 2147 05/04/23 0418  WBC 19.8* 28.8* 15.4*  NEUTROABS  --  4.0 4.7  HGB 11.7* 12.0* 10.0*  HCT 38.4 38.4* 31.7*  MCV 84 83.5 83.2  PLT 186 193 155   Basic Metabolic Panel:  Recent Labs  Lab 05/01/23 1141 05/02/23 2147 05/04/23 0418  NA 142 139 139  K 5.2 4.2 3.5  CL 104 104 105  CO2 22 20* 24  GLUCOSE 170* 129* 145*  BUN 36* 26* 28*  CREATININE 1.08 1.01 1.18  CALCIUM 10.2 9.7 8.7*   GFR: Estimated Creatinine Clearance: 43.6 mL/min (by C-G formula based on SCr of 1.18 mg/dL). Liver Function Tests:  Recent Labs  Lab 05/02/23 2147  AST 29  ALT 22  ALKPHOS 63  BILITOT 0.9  PROT 6.8  ALBUMIN 4.2   Recent Labs  Lab 05/03/23 0315 05/03/23 0811  LATICACIDVEN 3.0* 2.5*   Recent Results (from the past 240 hours)  Urine Culture     Status: Abnormal (Preliminary result)   Collection Time: 05/03/23  5:19 AM   Specimen: Urine,  Catheterized  Result Value Ref Range Status   Specimen Description URINE, CATHETERIZED  Final   Special Requests NONE Reflexed from [id number]  Final   Culture (A)  Final    >=100,000 COLONIES/mL STAPHYLOCOCCUS AUREUS SUSCEPTIBILITIES TO FOLLOW Performed at [facility], [address], [place], [place] [address]    Report Status PENDING  Incomplete  Culture, blood (Routine X 2) w Reflex to ID Panel     Status: None (Preliminary result)   Collection Time: 05/03/23  7:57 AM   Specimen: BLOOD LEFT HAND  Result Value Ref Range Status   Specimen Description BLOOD LEFT HAND  Final   Special Requests   Final    BOTTLES DRAWN AEROBIC AND ANAEROBIC Blood Culture adequate volume   Culture  Final    NO GROWTH 1 DAY Performed at [facility], [address], [place], [place] [address]    Report Status PENDING  Incomplete  Culture, blood (Routine X 2) w Reflex to ID Panel     Status: None (Preliminary result)   Collection Time: 05/03/23  8:01 AM   Specimen: BLOOD LEFT ARM  Result Value Ref Range Status   Specimen Description BLOOD LEFT ARM  Final   Special Requests   Final    BOTTLES DRAWN AEROBIC AND ANAEROBIC Blood Culture adequate volume   Culture   Final    NO GROWTH 1 DAY Performed at [facility], [address] [place], [place] [address]    Report Status PENDING  Incomplete    Antimicrobials/Microbiology: Anti-infectives (From admission, onward)    Start     Dose/Rate Route Frequency Ordered Stop   05/03/23 2200  cefTRIAXone (ROCEPHIN) 1 g in sodium chloride 0.9 % 100 mL IVPB  Status:  Discontinued        1 g 200 mL/hr over 30 Minutes Intravenous Every 24 hours 05/03/23 0613 05/03/23 0914   05/03/23 2200  cefTRIAXone (ROCEPHIN) 2 g in sodium chloride 0.9 % 100 mL IVPB        2 g 200 mL/hr over 30 Minutes Intravenous Every 24 hours 05/03/23 0914     05/03/23 0415  cefTRIAXone (ROCEPHIN) 2 g in sodium chloride 0.9 % 100 mL IVPB        2 g 200 mL/hr over 30  Minutes Intravenous  Once 05/03/23 0411 05/03/23 0533         Component Value Date/Time   SDES BLOOD LEFT ARM 05/03/2023 0801   SPECREQUEST  05/03/2023 0801    BOTTLES DRAWN AEROBIC AND ANAEROBIC Blood Culture adequate volume   CULT  05/03/2023 0801    NO GROWTH 1 DAY Performed at [facility], [address], [place], [place] [address]    REPTSTATUS PENDING 05/03/2023 0272     Radiology Studies: VAS [place] LOWER EXTREMITY VENOUS (DVT) Result Date: 05/03/2023  Lower Venous DVT Study Patient Name:  BEULAH MATUSEK Figley  Date of Exam:   05/03/2023 Medical Rec #: [id number]        Accession #:    [id number] Date of Birth: 1940/06/17        Patient Gender: M Patient Age:   34 years Exam Location:  [facility] Procedure:      VAS [place] LOWER EXTREMITY VENOUS (DVT) Referring Phys: DEBBY CROSLEY --------------------------------------------------------------------------------  Indications: Edema.  Anticoagulation: Xarelto. Comparison Study: No previous venous exams Performing Technologist: Jody Hill RVT, RDMS  Examination Guidelines: A complete evaluation includes B-mode imaging, spectral Doppler, color Doppler, and power Doppler as needed of all accessible portions of each vessel. Bilateral testing is considered an integral part of a complete examination. Limited examinations for reoccurring indications may be performed as noted. The reflux portion of the exam is performed with the patient in reverse Trendelenburg.  +-----+---------------+---------+-----------+----------+--------------+ RIGHTCompressibilityPhasicitySpontaneityPropertiesThrombus Aging +-----+---------------+---------+-----------+----------+--------------+ CFV  Full           Yes      Yes                                 +-----+---------------+---------+-----------+----------+--------------+   +---------+---------------+---------+-----------+----------+--------------+ LEFT      CompressibilityPhasicitySpontaneityPropertiesThrombus Aging +---------+---------------+---------+-----------+----------+--------------+ CFV      Full  Yes      Yes                                 +---------+---------------+---------+-----------+----------+--------------+ SFJ      Full                                                        +---------+---------------+---------+-----------+----------+--------------+ FV Prox  Full           Yes      Yes                                 +---------+---------------+---------+-----------+----------+--------------+ FV Mid   Full           Yes      Yes                                 +---------+---------------+---------+-----------+----------+--------------+ FV DistalFull           Yes      Yes                                 +---------+---------------+---------+-----------+----------+--------------+ PFV      Full                                                        +---------+---------------+---------+-----------+----------+--------------+ POP      Full           Yes      Yes                                 +---------+---------------+---------+-----------+----------+--------------+ PTV      Full                                                        +---------+---------------+---------+-----------+----------+--------------+ PERO     Full                                                        +---------+---------------+---------+-----------+----------+--------------+     Summary: RIGHT: - No evidence of common femoral vein obstruction.   LEFT: - There is no evidence of deep vein thrombosis in the lower extremity.  - No cystic structure found in the popliteal fossa. Subcutaneous edema from knee to ankle.  *See table(s) above for measurements and observations. Electronically signed by Sherald Hess MD on 05/03/2023 at 1:23:46 PM.    Final    CT Renal Stone Study Result Date: 05/03/2023 CLINICAL  DATA:  Abdominal pain, flank pain bilaterally EXAM: CT ABDOMEN  AND PELVIS WITHOUT CONTRAST TECHNIQUE: Multidetector CT imaging of the abdomen and pelvis was performed following the standard protocol without IV contrast. RADIATION DOSE REDUCTION: This exam was performed according to the departmental dose-optimization program which includes automated exposure control, adjustment of the mA and/or kV according to patient size and/or use of iterative reconstruction technique. COMPARISON:  01/18/2023 FINDINGS: Lower chest: No acute abnormality. Hepatobiliary: No focal liver abnormality is seen. No gallstones, gallbladder wall thickening, or biliary dilatation. Pancreas: Punctate calcifications are seen within the tail the pancreas, similar prior examination in keeping with changes of chronic pancreatitis in this location. No superimposed acute peripancreatic inflammatory changes are identified. No peripancreatic fluid collections. Spleen: Unremarkable Adrenals/Urinary Tract: The adrenal glands are. Kidneys are normal size and position simple cortical cyst arises from the anterior interpolar region kidney for which no follow-up imaging is recommended. Additional tiny simple cyst arises from the posterior interpolar region of the left kidney. Kidneys are otherwise unremarkable. The bladder is thick walled, in keeping with changes of chronic outlet obstruction, and is decompressed with a Foley catheter balloon seen within its lumen Stomach/Bowel: Moderate colonic stool burden. Moderate sigmoid diverticulosis. The stomach, small bowel, large bowel are otherwise unremarkable. Appendix normal. Vascular/Lymphatic: Extensive aortoiliac atherosclerotic calcification. No aortic aneurysm. Extensive pathologic adenopathy is again seen within the periportal, mesenteric, aortocaval, left periaortic, common iliac, and external iliac lymph node groups most in keeping with a lymphoproliferative process such as lymphoma or leukemia. Index  lymph node measures 3.4 cm in short axis diameter within the periportal lymph node group. Reproductive: Marked prostatic hypertrophy. Other: No abdominal wall hernia Musculoskeletal: No acute bone abnormality. No lytic or blastic bone lesion. Degenerative changes are seen within the thoracolumbar spine. IMPRESSION: 1. No acute intra-abdominal pathology identified. 2. Extensive pathologic adenopathy within the abdomen and pelvis most in keeping with a lymphoproliferative process such as lymphoma or leukemia. If indicated, PET CT examination would be helpful for further evaluation and to direct biopsy strategy. 3. Marked prostatic hypertrophy. Bladder wall thickening, in keeping with changes of chronic outlet obstruction. 4. Moderate colonic stool burden. 5. Moderate sigmoid diverticulosis. Aortic Atherosclerosis (ICD10-I70.0). Electronically Signed   By: Helyn Numbers M.D.   On: 05/03/2023 03:29     LOS: 1 day   Total time spent in review of labs and imaging, patient evaluation, formulation of plan, documentation and communication with family: 35 minutes  Lanae Boast, MD  [facility]  05/04/2023, 11:45 AM

## 2023-05-04 NOTE — Telephone Encounter (Signed)
Patient did not call. Patient checked out in person.

## 2023-05-04 NOTE — Progress Notes (Signed)
   05/04/23 2351  BiPAP/CPAP/SIPAP  BiPAP/CPAP/SIPAP Pt Type Adult  BiPAP/CPAP/SIPAP Resmed  FiO2 (%) 21 %  Patient Home Equipment Yes

## 2023-05-04 NOTE — Progress Notes (Signed)
Mobility Specialist - Progress Note   05/04/23 1023  Mobility  Activity Ambulated with assistance in hallway  Level of Assistance Standby assist, set-up cues, supervision of patient - no hands on  Assistive Device Front wheel walker  Distance Ambulated (ft) 400 ft  Activity Response Tolerated well  Mobility Referral Yes  Mobility visit 1 Mobility  Mobility Specialist Start Time (ACUTE ONLY) 0950  Mobility Specialist Stop Time (ACUTE ONLY) 1000  Mobility Specialist Time Calculation (min) (ACUTE ONLY) 10 min   Pt received in recliner and agreeable to mobility. C/o lower back pain throughout session.  Pt to recliner after session with all needs met.    [facility]

## 2023-05-04 NOTE — Telephone Encounter (Addendum)
Called patient regarding results. Left detailed message for patient regarding results. Letter mailed to patient on 05/07/23.----- Message from Joni Reining sent at 05/03/2023  7:40 AM EST ----- I have reviewed the labs.  Still anemic, Hgb 11/7 compared to 12.0 on last blood draw. WBC are elevated.  Please see PCP for investigation concerning infection, likely from the bladder with the foley. Kidney function is okay. BUN is elevated due to the blood in the urine. Please follow up with your urologist.  No further cardiac testing is planned.

## 2023-05-04 NOTE — TOC CM/SW Note (Signed)
Transition of Care [facility]) - Inpatient Brief Assessment   Patient Details  Name: Robert Burns MRN: [id number] Date of Birth: 02-Jan-1941  Transition of Care [facility]) CM/SW Contact:    Howell Rucks, RN Phone Number: 05/04/2023, 10:53 AM   Clinical Narrative: Met with pt at bedside to introduce role of TOC/NCM and review for dc planning, pt reports he has an established PCP and pharmacy, no current home care services, reports he has a walker he uses as needed, reports he feels safe returning home with support from family, confirmed transportation is available at discharge. TOC Brief Assessment completed. No TOC needs identified at this time.     Transition of Care Asessment: Insurance and Status: Insurance coverage has been reviewed Patient has primary care physician: Yes Home environment has been reviewed: resides in private residence with family support Prior level of function:: Independent with walker as needed Prior/Current Home Services: No current home services Social Drivers of Health Review: SDOH reviewed no interventions necessary Readmission risk has been reviewed: Yes Transition of care needs: no transition of care needs at this time

## 2023-05-04 NOTE — Hospital Course (Addendum)
82 yom w/ history of CLL, HTN, HLD, BPH [with chronic indwelling Foley catheter, OSA on CPAP, PAD, T2DM , presents with complaints of abdominal pain, workup significant for UTI, Foley catheter exchanged in ED. The Foley is changed monthly at the urologist office.  In the ED vitals were stable, labs with significant leukocytosis 19>22k, stable renal function blood culture urine culture obtained, CT renal protocol with no acute intra-abdominal difficult only for extensive pathologic adenopathy in the abdomen and pelvis, but no acute findings in kidneys, unknown renal bladder wall thickening in the setting of chronic outlet obstruction.

## 2023-05-05 DIAGNOSIS — N390 Urinary tract infection, site not specified: Secondary | ICD-10-CM | POA: Diagnosis not present

## 2023-05-05 LAB — CBC
HCT: 32.1 % — ABNORMAL LOW (ref 39.0–52.0)
Hemoglobin: 10 g/dL — ABNORMAL LOW (ref 13.0–17.0)
MCH: 26.3 pg (ref 26.0–34.0)
MCHC: 31.2 g/dL (ref 30.0–36.0)
MCV: 84.5 fL (ref 80.0–100.0)
Platelets: 134 10*3/uL — ABNORMAL LOW (ref 150–400)
RBC: 3.8 MIL/uL — ABNORMAL LOW (ref 4.22–5.81)
RDW: 16.7 % — ABNORMAL HIGH (ref 11.5–15.5)
WBC: 13.6 10*3/uL — ABNORMAL HIGH (ref 4.0–10.5)
nRBC: 0 % (ref 0.0–0.2)

## 2023-05-05 LAB — GLUCOSE, CAPILLARY
Glucose-Capillary: 128 mg/dL — ABNORMAL HIGH (ref 70–99)
Glucose-Capillary: 154 mg/dL — ABNORMAL HIGH (ref 70–99)
Glucose-Capillary: 167 mg/dL — ABNORMAL HIGH (ref 70–99)

## 2023-05-05 LAB — URINE CULTURE: Culture: >=100000 — AB

## 2023-05-05 MED ORDER — VANCOMYCIN HCL IN DEXTROSE 1-5 GM/200ML-% IV SOLN
1000.0000 mg | Freq: Two times a day (BID) | INTRAVENOUS | Status: DC
  Administered 2023-05-06 – 2023-05-07 (×2): 1000 mg via INTRAVENOUS
  Filled 2023-05-05 (×3): qty 200

## 2023-05-05 MED ORDER — VITAMIN D 25 MCG (1000 UNIT) PO TABS
1000.0000 [IU] | ORAL_TABLET | Freq: Every day | ORAL | Status: DC
  Administered 2023-05-05 – 2023-05-07 (×3): 1000 [IU] via ORAL
  Filled 2023-05-05 (×3): qty 1

## 2023-05-05 MED ORDER — ADULT MULTIVITAMIN W/MINERALS CH
1.0000 | ORAL_TABLET | Freq: Every day | ORAL | Status: DC
  Administered 2023-05-05 – 2023-05-07 (×3): 1 via ORAL
  Filled 2023-05-05 (×3): qty 1

## 2023-05-05 MED ORDER — VANCOMYCIN HCL 1500 MG/300ML IV SOLN
1500.0000 mg | Freq: Once | INTRAVENOUS | Status: AC
  Administered 2023-05-05: 1500 mg via INTRAVENOUS
  Filled 2023-05-05: qty 300

## 2023-05-05 NOTE — Progress Notes (Signed)
Pharmacy Antibiotic Note  Robert Burns is a 83 y.o. male admitted on 05/02/2023 with UTI.  Pharmacy has been consulted for vancomycin dosing. Day #3 ceftriaxone.  WBC 28.8>> 13.6 Afebrile. SCr 1.18: UCx drawn from foley before exchanged with > 100 K MRSA.   Plan: Vancomycin 1500 mg IV x 1 then Vancomycin 1000 mg IV q24 for eAUC 460.2 using SCr 1.18, VD 0.72 Continues on ceftriaxone 2 gm IV q24 per MD Check SCr in AM to f/u renal fxn  Height: 5\' 7"  (170.2 cm) Weight: 74.4 kg (164 lb 0.4 oz) IBW/kg (Calculated) : 66.1  Temp (24hrs), Avg:98 F (36.7 C), Min:97.5 F (36.4 C), Max:98.4 F (36.9 C)  Recent Labs  Lab 05/01/23 1141 05/02/23 2147 05/03/23 0315 05/03/23 0811 05/04/23 0418 05/05/23 0714  WBC 19.8* 28.8*  --   --  15.4* 13.6*  CREATININE 1.08 1.01  --   --  1.18  --   LATICACIDVEN  --   --  3.0* 2.5*  --   --     Estimated Creatinine Clearance: 43.6 mL/min (by C-G formula based on SCr of 1.18 mg/dL).    Allergies  Allergen Reactions   Peanut-Containing Drug Products Anaphylaxis and Other (See Comments)    Tongue swelling is severe    Ace Inhibitors Cough        Diltiazem Hcl Other (See Comments)    UNSPECIFIED REACTION     Meloxicam Other (See Comments)    GI upset    Doxycycline Other (See Comments) and Cough    Reaction of cough and runny nose   Eliquis [Apixaban] Other (See Comments)    dizziness   Sertraline Hcl Other (See Comments)   Carvedilol Itching   Clonidine Hcl Other (See Comments)    Patch only - skin irritation   Duloxetine Hcl Other (See Comments)    Urinary frequency    Antimicrobials this admission: 1/23 ceftriaxone>> 1/25 vancomycin>> Dose adjustments this admission:  Microbiology results: 1/23 BCx2 ngtd 1/32 cath UCx: > 100 K MRSA   Thank you for allowing pharmacy to be a part of this patient's care.  Herby Abraham, Pharm.D Use secure chat for questions 05/05/2023 3:26 PM

## 2023-05-05 NOTE — Progress Notes (Signed)
PROGRESS NOTE Robert Burns  WUJ:[id number] DOB: 22-Apr-1940 DOA: 05/02/2023 PCP: Thana Ates, MD  Brief Narrative/Hospital Course: 67 yom w/ history of CLL, HTN, HLD, BPH [with chronic indwelling Foley catheter, OSA on CPAP, PAD, T2DM , presents with complaints of abdominal pain, workup significant for UTI, Foley catheter exchanged in ED. The Foley is changed monthly at the urologist office.  In the ED vitals were stable, labs with significant leukocytosis 19>22k, stable renal function blood culture urine culture obtained, CT renal protocol with no acute intra-abdominal difficult only for extensive pathologic adenopathy in the abdomen and pelvis, but no acute findings in kidneys, unknown renal bladder wall thickening in the setting of chronic outlet obstruction.  Patient continued on IV antibiotics leukocytosis nicely improving.  Urine culture with Staph aureus >100,000. But blood cx negative.     Subjective: Complains of pain on bottom/back  No dysuria no fever chills  CBC with improving WBC count   Assessment and Plan: Principal Problem:   Complicated UTI (urinary tract infection) Active Problems:   CAD (coronary artery disease) of bypass graft   CLL (chronic lymphocytic leukemia) (HCC)   Essential hypertension   SSS (sick sinus syndrome), medtronic adapta   PAF (paroxysmal atrial fibrillation) (HCC)   OSA on CPAP   Dyslipidemia (high LDL; low HDL)   Peripheral arterial disease (HCC)   Hypercholesterolemia   Chronic anticoagulation     Concern for complicated UTI due to chronic indwelling Foley catheter: Labs showed leukocytosis UA grossly abnormal but has chronic Foley which was changed in the ED> admitted due to concern for UTI Urine culture with MRSA But blood culture no growth to date-I have requested ID input question coronation, as patient appears to be clinically improving as WBC improving with ceftriaxone.  Await further ID recommendation Continue Proscar Flomax, patient  has urology as outpatient Recent Labs  Lab 05/01/23 1141 05/02/23 2147 05/03/23 0315 05/03/23 0811 05/04/23 0418 05/05/23 0714  WBC 19.8* 28.8*  --   --  15.4* 13.6*  LATICACIDVEN  --   --  3.0* 2.5*  --   --    Left lower extremity swelling Duplex negative for DVT.  CAD s/p bypass graft Hypertension SSS HLD PAD: No chest pain BP stable, PPM in place. Continue home Imdur and metoprolol Ranexa Xarelto and atorvastatin.  CLL: Followed by outpatient oncology continues outpatient care with Dr. Leonides Schanz, imaging significant for extensive lymphadenopathy in abdomen and pelvis  PAF: Rate controlled on metoprolol.  Continue home Xarelto.  T2DM: Blood sugar fairly controlled.Continue SSI  Recent Labs  Lab 05/04/23 1151 05/04/23 1636 05/04/23 2204 05/05/23 0731 05/05/23 1122  GLUCAP 143* 143* 179* 128* 154*   DVT prophylaxis: rivaroxaban (XARELTO) tablet 20 mg Start: 05/03/23 1700 Code Status:   Code Status: Full Code Family Communication: plan of care discussed with patient at bedside. Patient status is: Remains hospitalized because of severity of illness Level of care: Med-Surg   Dispo: The patient is from: Home with family            Anticipated disposition: Pending further ID inputs   Objective: Vitals last 24 hrs: Vitals:   05/04/23 2210 05/05/23 0642 05/05/23 0915 05/05/23 1308  BP: (!) 144/62 (!) 125/57 133/61 (!) 127/57  Pulse: 73 60 62 66  Resp: 17 17 16 20   Temp: 98.4 F (36.9 C) (!) 97.5 F (36.4 C) 97.8 F (36.6 C) 98.2 F (36.8 C)  TempSrc: Oral Oral Oral Oral  SpO2: 96% 98% 96% 97%  Weight:  Height:       Weight change:   Physical Examination: General exam: alert awakex3 HEENT:Oral mucosa moist, Ear/Nose WNL grossly Respiratory system: Bilaterally clear BS,no use of accessory muscle Cardiovascular system: S1 & S2 +, No JVD. Gastrointestinal system: Abdomen soft,NT,ND, BS+ Nervous System: Alert, awake, moving all extremities,and following  commands. Extremities: LE edema neg,distal peripheral pulses palpable and warm.  Skin: No rashes,no icterus. MSK: Normal muscle bulk,tone, power  Foley+  Medications reviewed:  Scheduled Meds:  atorvastatin  80 mg Oral q1800   Chlorhexidine Gluconate Cloth  6 each Topical Daily   finasteride  5 mg Oral Daily   insulin aspart  0-15 Units Subcutaneous TID WC   insulin aspart  0-5 Units Subcutaneous QHS   isosorbide mononitrate  60 mg Oral Daily   metoprolol tartrate  50 mg Oral BID   ranolazine  500 mg Oral BID   rivaroxaban  20 mg Oral Q supper   tamsulosin  0.4 mg Oral QPC supper   Continuous Infusions:  cefTRIAXone (ROCEPHIN)  IV Stopped (05/04/23 2205)      Diet Order             Diet heart healthy/carb modified Room service appropriate? Yes; Fluid consistency: Thin  Diet effective now                  Intake/Output Summary (Last 24 hours) at 05/05/2023 1424 Last data filed at 05/05/2023 1249 Gross per 24 hour  Intake 1261.52 ml  Output 1450 ml  Net -188.48 ml   Net IO Since Admission: -2,165.65 mL [05/05/23 1424]  Wt Readings from Last 3 Encounters:  05/02/23 74.4 kg  05/01/23 74.4 kg  02/26/23 72.1 kg     Unresulted Labs (From admission, onward)    None     Data Reviewed: I have personally reviewed following labs and imaging studies CBC: Recent Labs  Lab 05/01/23 1141 05/02/23 2147 05/04/23 0418 05/05/23 0714  WBC 19.8* 28.8* 15.4* 13.6*  NEUTROABS  --  4.0 4.7  --   HGB 11.7* 12.0* 10.0* 10.0*  HCT 38.4 38.4* 31.7* 32.1*  MCV 84 83.5 83.2 84.5  PLT 186 193 155 134*   Basic Metabolic Panel:  Recent Labs  Lab 05/01/23 1141 05/02/23 2147 05/04/23 0418  NA 142 139 139  K 5.2 4.2 3.5  CL 104 104 105  CO2 22 20* 24  GLUCOSE 170* 129* 145*  BUN 36* 26* 28*  CREATININE 1.08 1.01 1.18  CALCIUM 10.2 9.7 8.7*   GFR: Estimated Creatinine Clearance: 43.6 mL/min (by C-G formula based on SCr of 1.18 mg/dL). Liver Function Tests:  Recent Labs   Lab 05/02/23 2147  AST 29  ALT 22  ALKPHOS 63  BILITOT 0.9  PROT 6.8  ALBUMIN 4.2   Recent Labs  Lab 05/03/23 0315 05/03/23 0811  LATICACIDVEN 3.0* 2.5*   Recent Results (from the past 240 hours)  Urine Culture     Status: Abnormal   Collection Time: 05/03/23  5:19 AM   Specimen: Urine, Catheterized  Result Value Ref Range Status   Specimen Description URINE, CATHETERIZED  Final   Special Requests   Final    NONE Reflexed from [id number] Performed at [facility], [address] [place], [place] [address]    Culture (A)  Final    >=100,000 COLONIES/mL METHICILLIN RESISTANT STAPHYLOCOCCUS AUREUS   Report Status 05/05/2023 FINAL  Final   Organism ID, Bacteria METHICILLIN RESISTANT STAPHYLOCOCCUS AUREUS (A)  Final  Susceptibility   Methicillin resistant staphylococcus aureus - MIC*    CIPROFLOXACIN 1 SENSITIVE Sensitive     GENTAMICIN <=0.5 SENSITIVE Sensitive     NITROFURANTOIN <=16 SENSITIVE Sensitive     OXACILLIN >=4 RESISTANT Resistant     TETRACYCLINE <=1 SENSITIVE Sensitive     VANCOMYCIN <=0.5 SENSITIVE Sensitive     TRIMETH/SULFA <=10 SENSITIVE Sensitive     RIFAMPIN <=0.5 SENSITIVE Sensitive     Inducible Clindamycin NEGATIVE Sensitive     LINEZOLID 2 SENSITIVE Sensitive     * >=100,000 COLONIES/mL METHICILLIN RESISTANT STAPHYLOCOCCUS AUREUS  Culture, blood (Routine X 2) w Reflex to ID Panel     Status: None (Preliminary result)   Collection Time: 05/03/23  7:57 AM   Specimen: BLOOD LEFT HAND  Result Value Ref Range Status   Specimen Description BLOOD LEFT HAND  Final   Special Requests   Final    BOTTLES DRAWN AEROBIC AND ANAEROBIC Blood Culture adequate volume   Culture   Final    NO GROWTH 2 DAYS Performed at [facility], [address] [place], [place] [address]    Report Status PENDING  Incomplete  Culture, blood (Routine X 2) w Reflex to ID Panel     Status: None (Preliminary result)   Collection Time: 05/03/23  8:01 AM   Specimen:  BLOOD LEFT ARM  Result Value Ref Range Status   Specimen Description BLOOD LEFT ARM  Final   Special Requests   Final    BOTTLES DRAWN AEROBIC AND ANAEROBIC Blood Culture adequate volume   Culture   Final    NO GROWTH 2 DAYS Performed at [facility], [address], [place], [place] [address]    Report Status PENDING  Incomplete    Antimicrobials/Microbiology: Anti-infectives (From admission, onward)    Start     Dose/Rate Route Frequency Ordered Stop   05/03/23 2200  cefTRIAXone (ROCEPHIN) 1 g in sodium chloride 0.9 % 100 mL IVPB  Status:  Discontinued        1 g 200 mL/hr over 30 Minutes Intravenous Every 24 hours 05/03/23 0613 05/03/23 0914   05/03/23 2200  cefTRIAXone (ROCEPHIN) 2 g in sodium chloride 0.9 % 100 mL IVPB        2 g 200 mL/hr over 30 Minutes Intravenous Every 24 hours 05/03/23 0914     05/03/23 0415  cefTRIAXone (ROCEPHIN) 2 g in sodium chloride 0.9 % 100 mL IVPB        2 g 200 mL/hr over 30 Minutes Intravenous  Once 05/03/23 0411 05/03/23 0533         Component Value Date/Time   SDES BLOOD LEFT ARM 05/03/2023 0801   SPECREQUEST  05/03/2023 0801    BOTTLES DRAWN AEROBIC AND ANAEROBIC Blood Culture adequate volume   CULT  05/03/2023 0801    NO GROWTH 2 DAYS Performed at [facility], [address] [place], [place] [address]    REPTSTATUS PENDING 05/03/2023 9147     Radiology Studies: No results found.  LOS: 2 days  Total time spent in review of labs and imaging, patient evaluation, formulation of plan, documentation and communication with family: 35 minutes  Lanae Boast, MD  [facility]  05/05/2023, 2:24 PM

## 2023-05-05 NOTE — Consult Note (Signed)
[facility]  Total days of antibiotics 4 ceftriaxone       Reason for Consult: complicated uti, possible MRSA   Referring Physician: Lanae Boast  Principal Problem:   Complicated UTI (urinary tract infection) Active Problems:   Essential hypertension   SSS (sick sinus syndrome), medtronic adapta   OSA on CPAP   Peripheral arterial disease (HCC)   CAD (coronary artery disease) of bypass graft   Hypercholesterolemia   PAF (paroxysmal atrial fibrillation) (HCC)   Dyslipidemia (high LDL; low HDL)   Chronic anticoagulation   CLL (chronic lymphocytic leukemia) (HCC)    HPI: Robert Burns is a 83 y.o. male with hx of CLL, CAD s/p CABG, HTN, BPH with chronic foley catheter who was admitted on 1/21 for report of worsening bilateral flank pain in the last week that was constant, he denies nausea and vomiting/ nor fever or chills. Pain was more concerning on left side. He is also to be seen by urology for his upcoming foley catheter change. In the ER, work up was notable for labs showing leukocytosis of 28K and renal stone CT showing adenopathy but fel to be due to CLL. Has marked prostatic hypertrophy. He had ua and urine cx that + MRSA. He was empirically started on ceftriaxone. His foley catheter has since been changed. Concurrent blood cx on admit were collected and have been no growth to date. He still feels like he has left sided flank pain and discomfort. Urine in foley catheter is clearing per his aide. He noticed his blood sugars running higher than normal   In terms of CLL, He was also seen by dr Leonides Schanz from oncology in early January 2025 and felt that he did not meet criteria yet for beginning treatment and recommended follow up in 6 Month  Past Medical History:  Diagnosis Date   Anemia    Anxiety    Arthritis    "all over" (11/25/2015)   CAD (coronary artery disease)    a. s/p CABG  06/2002; b. 02/01/15 PCI: DES to prox SVG to PDA, staged PCI of SVG to  Diag in 02/2015; c. 04/2017 Cath/PCI: LM nl, LAD 100ost, 54m, 75d, LCX 60ost, OM2 80, RCA 100ost, RPDA 80, LIMA->LAD ok, VG->D1 patent stent, VG->RPDA patent stent, 50p, VG->OM1->OM2 90p (3.0x24 Synergy DES), 100 between OM1->OM2 (med rx).   Cancer [facility])    Right Shoulder, Left Leg- BCC, SCC, AND MELANOMA   Colon polyp    Elevated cholesterol    Epithelioid hemangioendothelioma    s/p resection of left SFA/mass with interposition 6 mm GoreTex graft 06/02/10, s/p repeat resection for positive margins 09/22/10   High blood pressure    Hyperlipidemia    Hypertension    Leg pain    OSA on CPAP    PAD (peripheral artery disease) (HCC)    a. 10/2002 L SFA PTA/BMS; b. 8/17 LE Angio: LEIA 90 (9x40 self exp stent), LSFA short segment prox occlusion (staged PTA/stenting 01/03/2016), patent mid stent, RSFA 37m (staged PTA/DEB 02/14/2016).   Presence of permanent cardiac pacemaker    Medtronic   Renal insufficiency 12/18/2022   SSS (sick sinus syndrome) (HCC)    a. s/p PPM in 2007 with gen change 04/2015 - Medtronic Adapta ADDRL1, ser # [id number] H.   Type II diabetes mellitus (HCC)    Type II   Urgency of urination     Allergies:  Allergies  Allergen Reactions   Peanut-Containing Drug Products Anaphylaxis and Other (See  Comments)    Tongue swelling is severe    Ace Inhibitors Cough        Diltiazem Hcl Other (See Comments)    UNSPECIFIED REACTION     Meloxicam Other (See Comments)    GI upset    Doxycycline Other (See Comments) and Cough    Reaction of cough and runny nose   Eliquis [Apixaban] Other (See Comments)    dizziness   Sertraline Hcl Other (See Comments)   Carvedilol Itching   Clonidine Hcl Other (See Comments)    Patch only - skin irritation   Duloxetine Hcl Other (See Comments)    Urinary frequency     MEDICATIONS:  atorvastatin  80 mg Oral q1800   Chlorhexidine Gluconate Cloth  6 each Topical Daily   cholecalciferol  1,000 Units Oral Daily   finasteride  5 mg Oral  Daily   insulin aspart  0-15 Units Subcutaneous TID WC   insulin aspart  0-5 Units Subcutaneous QHS   isosorbide mononitrate  60 mg Oral Daily   metoprolol tartrate  50 mg Oral BID   multivitamin with minerals  1 tablet Oral Daily   ranolazine  500 mg Oral BID   rivaroxaban  20 mg Oral Q supper   tamsulosin  0.4 mg Oral QPC supper    Social History   Tobacco Use   Smoking status: Former    Types: Pipe    Quit date: 1973    Years since quitting: 52.1   Smokeless tobacco: Never   Tobacco comments:    "quit smoking in 1973"  Vaping Use   Vaping status: Never Used  Substance Use Topics   Alcohol use: No   Drug use: No    Family History  Problem Relation Age of Onset   Coronary artery disease Mother    Heart attack Mother    Hypertension Mother    Heart disease Mother        Open  Heart surgery   Diabetes Father    Heart disease Father    Hyperlipidemia Father    Heart attack Father    Hypertension Sister    Diabetes Sister    Heart disease Sister    Liver disease Neg Hx    Esophageal cancer Neg Hx    Colon cancer Neg Hx     Review of Systems - left flank pain otherwise no other complaint   OBJECTIVE: Temp:  [97.5 F (36.4 C)-98.4 F (36.9 C)] 98.2 F (36.8 C) (01/25 1308) Pulse Rate:  [60-73] 66 (01/25 1308) Resp:  [16-20] 20 (01/25 1308) BP: (125-144)/(57-62) 127/57 (01/25 1308) SpO2:  [96 %-98 %] 97 % (01/25 1308) FiO2 (%):  [21 %] 21 % (01/24 2351) Physical Exam  Constitutional: He is oriented to person, place, and time. He appears well-developed and well-nourished. No distress.  HENT:  Mouth/Throat: Oropharynx is clear and moist. No oropharyngeal exudate.  Cardiovascular: Normal rate, regular rhythm and normal heart sounds. Exam reveals no gallop and no friction rub.  No murmur heard.  Pulmonary/Chest: Effort normal and breath sounds normal. No respiratory distress. He has no wheezes.  Abdominal: Soft. Bowel sounds are normal. He exhibits no  distension. There is no tenderness.  Lymphadenopathy:  He has no cervical adenopathy.  Neurological: He is alert and oriented to person, place, and time.  Skin: Skin is warm and dry. No rash noted. No erythema.  Psychiatric: He has a normal mood and affect. His behavior is normal.   LABS: Results for  orders placed or performed during the hospital encounter of 05/02/23 (from the past 48 hours)  Glucose, capillary     Status: Abnormal   Collection Time: 05/03/23  9:12 PM  Result Value Ref Range   Glucose-Capillary 214 (H) 70 - 99 mg/dL    Comment: Glucose reference range applies only to samples taken after fasting for at least 8 hours.  Basic metabolic panel     Status: Abnormal   Collection Time: 05/04/23  4:18 AM  Result Value Ref Range   Sodium 139 135 - 145 mmol/L   Potassium 3.5 3.5 - 5.1 mmol/L   Chloride 105 98 - 111 mmol/L   CO2 24 22 - 32 mmol/L   Glucose, Bld 145 (H) 70 - 99 mg/dL    Comment: Glucose reference range applies only to samples taken after fasting for at least 8 hours.   BUN 28 (H) 8 - 23 mg/dL   Creatinine, Ser 1.61 0.61 - 1.24 mg/dL   Calcium 8.7 (L) 8.9 - 10.3 mg/dL   GFR, Estimated >09 >60 mL/min    Comment: (NOTE) Calculated using the CKD-EPI Creatinine Equation (2021)    Anion gap 10 5 - 15    Comment: Performed at [facility], [address] [place], [place] [address]  CBC with Differential/Platelet     Status: Abnormal   Collection Time: 05/04/23  4:18 AM  Result Value Ref Range   WBC 15.4 (H) 4.0 - 10.5 K/uL   RBC 3.81 (L) 4.22 - 5.81 MIL/uL   Hemoglobin 10.0 (L) 13.0 - 17.0 g/dL   HCT 81.1 (L) 91.4 - 78.2 %   MCV 83.2 80.0 - 100.0 fL   MCH 26.2 26.0 - 34.0 pg   MCHC 31.5 30.0 - 36.0 g/dL   RDW 95.6 (H) 21.3 - 08.6 %   Platelets 155 150 - 400 K/uL   nRBC 0.0 0.0 - 0.2 %   Neutrophils Relative % 31 %   Neutro Abs 4.7 1.7 - 7.7 K/uL   Lymphocytes Relative 62 %   Lymphs Abs 9.6 (H) 0.7 - 4.0 K/uL   Monocytes Relative 6 %    Monocytes Absolute 0.9 0.1 - 1.0 K/uL   Eosinophils Relative 1 %   Eosinophils Absolute 0.2 0.0 - 0.5 K/uL   Basophils Relative 0 %   Basophils Absolute 0.0 0.0 - 0.1 K/uL   WBC Morphology Abnormal lymphocytes present    Immature Granulocytes 0 %   Abs Immature Granulocytes 0.04 0.00 - 0.07 K/uL   Smudge Cells PRESENT    Ovalocytes PRESENT    Platelet Morphology NORMAL     Comment: Performed at [facility], [address], [place], [place] [address]  Glucose, capillary     Status: Abnormal   Collection Time: 05/04/23  7:28 AM  Result Value Ref Range   Glucose-Capillary 140 (H) 70 - 99 mg/dL    Comment: Glucose reference range applies only to samples taken after fasting for at least 8 hours.  Glucose, capillary     Status: Abnormal   Collection Time: 05/04/23 11:51 AM  Result Value Ref Range   Glucose-Capillary 143 (H) 70 - 99 mg/dL    Comment: Glucose reference range applies only to samples taken after fasting for at least 8 hours.  Glucose, capillary     Status: Abnormal   Collection Time: 05/04/23  4:36 PM  Result Value Ref Range   Glucose-Capillary 143 (H) 70 - 99 mg/dL    Comment: Glucose  reference range applies only to samples taken after fasting for at least 8 hours.  Glucose, capillary     Status: Abnormal   Collection Time: 05/04/23 10:04 PM  Result Value Ref Range   Glucose-Capillary 179 (H) 70 - 99 mg/dL    Comment: Glucose reference range applies only to samples taken after fasting for at least 8 hours.  CBC     Status: Abnormal   Collection Time: 05/05/23  7:14 AM  Result Value Ref Range   WBC 13.6 (H) 4.0 - 10.5 K/uL   RBC 3.80 (L) 4.22 - 5.81 MIL/uL   Hemoglobin 10.0 (L) 13.0 - 17.0 g/dL   HCT 52.8 (L) 41.3 - 24.4 %   MCV 84.5 80.0 - 100.0 fL   MCH 26.3 26.0 - 34.0 pg   MCHC 31.2 30.0 - 36.0 g/dL   RDW 01.0 (H) 27.2 - 53.6 %   Platelets 134 (L) 150 - 400 K/uL   nRBC 0.0 0.0 - 0.2 %    Comment: Performed at [facility], [address], [place], [place] [address]  Glucose, capillary     Status: Abnormal   Collection Time: 05/05/23  7:31 AM  Result Value Ref Range   Glucose-Capillary 128 (H) 70 - 99 mg/dL    Comment: Glucose reference range applies only to samples taken after fasting for at least 8 hours.  Glucose, capillary     Status: Abnormal   Collection Time: 05/05/23 11:22 AM  Result Value Ref Range   Glucose-Capillary 154 (H) 70 - 99 mg/dL    Comment: Glucose reference range applies only to samples taken after fasting for at least 8 hours.  Glucose, capillary     Status: Abnormal   Collection Time: 05/05/23  4:39 PM  Result Value Ref Range   Glucose-Capillary 167 (H) 70 - 99 mg/dL    Comment: Glucose reference range applies only to samples taken after fasting for at least 8 hours.    MICRO: U cx= MRSA Methicillin resistant staphylococcus aureus      MIC    CIPROFLOXACIN 1 SENSITIVE Sensitive    GENTAMICIN <=0.5 SENSI... Sensitive    Inducible Clindamycin NEGATIVE Sensitive    LINEZOLID 2 SENSITIVE Sensitive    NITROFURANTOIN <=16 SENSIT... Sensitive    OXACILLIN >=4 RESISTANT Resistant    RIFAMPIN <=0.5 SENSI... Sensitive    TETRACYCLINE <=1 SENSITIVE Sensitive    TRIMETH/SULFA <=10 SENSIT... Sensitive    VANCOMYCIN <=0.5 SENSI... Sensitive    IMAGING: No results found.   Assessment/Plan:  83yo M with possible pyelonephritis/complicated UTI due to chronic foley. He was only cultured from his indwelling catheter which is likely colonized, however he did have leukocytosis of 29K above his baseline of 14K from CLL. Luckily blood cx ngtd thus far at the 48hr mark  - I suspect MRSA still can be a part of his presentation, recommend to start vancomycin. Await blood cx tomorrow to see if can treat with oral regimen if no bacteremia - will continue to follow surveillance blood cx at 3-4 day mark -hyperglycemia = defer to primary team for sliding scale. Likely elevated in the setting  of infection  - place on contact isolation due mitigation strategy to minimize disease transmission

## 2023-05-06 DIAGNOSIS — N390 Urinary tract infection, site not specified: Secondary | ICD-10-CM | POA: Diagnosis not present

## 2023-05-06 LAB — GLUCOSE, CAPILLARY
Glucose-Capillary: 189 mg/dL — ABNORMAL HIGH (ref 70–99)
Glucose-Capillary: 156 mg/dL — ABNORMAL HIGH (ref 70–99)
Glucose-Capillary: 207 mg/dL — ABNORMAL HIGH (ref 70–99)
Glucose-Capillary: 178 mg/dL — ABNORMAL HIGH (ref 70–99)
Glucose-Capillary: 187 mg/dL — ABNORMAL HIGH (ref 70–99)

## 2023-05-06 LAB — CREATININE, SERUM
Creatinine, Ser: 0.95 mg/dL (ref 0.61–1.24)
GFR, Estimated: >60 mL/min (ref >60)

## 2023-05-06 MED ORDER — INSULIN ASPART 100 UNIT/ML IJ SOLN: 0.0000 [IU] | Freq: Every day | INTRAMUSCULAR | Status: DC

## 2023-05-06 MED ORDER — INSULIN ASPART 100 UNIT/ML IJ SOLN
0.0000 [IU] | Freq: Three times a day (TID) | INTRAMUSCULAR | Status: DC
  Administered 2023-05-06: 2 [IU] via SUBCUTANEOUS
  Administered 2023-05-07: 1 [IU] via SUBCUTANEOUS

## 2023-05-06 NOTE — Progress Notes (Signed)
PROGRESS NOTE Robert Burns  ZOX:[id number] DOB: Sep 02, 1940 DOA: 05/02/2023 PCP: Thana Ates, MD  Brief Narrative/Hospital Course: 54 yom w/ history of CLL, HTN, HLD, BPH [with chronic indwelling Foley catheter, OSA on CPAP, PAD, T2DM , presents with complaints of abdominal pain, workup significant for UTI, Foley catheter exchanged in ED. The Foley is changed monthly at the urologist office.  In the ED vitals were stable, labs with significant leukocytosis 19>22k, stable renal function blood culture urine culture obtained, CT renal protocol with no acute intra-abdominal difficult only for extensive pathologic adenopathy in the abdomen and pelvis, but no acute findings in kidneys, unknown renal bladder wall thickening in the setting of chronic outlet obstruction.  Patient continued on IV antibiotics leukocytosis nicely improving.  Urine culture with Staph aureus >100,000. But blood cx negative.  Subjective: Seen and examined. Doing well. Overnight afebrile BP stable  Complains of some left buttock pain  Assessment and Plan: Principal Problem:   Complicated UTI (urinary tract infection) Active Problems:   CAD (coronary artery disease) of bypass graft   CLL (chronic lymphocytic leukemia) (HCC)   Essential hypertension   SSS (sick sinus syndrome), medtronic adapta   PAF (paroxysmal atrial fibrillation) (HCC)   OSA on CPAP   Dyslipidemia (high LDL; low HDL)   Peripheral arterial disease (HCC)   Hypercholesterolemia   Chronic anticoagulation  MRSA UTI Concern for complicated UTI due to chronic indwelling Foley catheter: Labs showed leukocytosis UA grossly abnormal but has chronic Foley which was changed in the ED> admitted due to concern for UTI Urine culture with MRSA But blood culture no growth to date.  Given his presenting symptoms and also with baseline CLL-concern about MRSA UTI fortunately blood culture no growth so far.  Appreciate ID input-though had CLL with expected leukocytosis  his WBC count continues to improve.  Follow surveillance blood culture at 3 to 4-day mark, continue IV vancomycin 1/25>>, contact isolation, ceftriaxone 1/23>>.Continue Proscar Flomax, patient has urology follow up as outpatient.. Recent Labs  Lab 05/01/23 1141 05/02/23 2147 05/03/23 0315 05/03/23 0811 05/04/23 0418 05/05/23 0714  WBC 19.8* 28.8*  --   --  15.4* 13.6*  LATICACIDVEN  --   --  3.0* 2.5*  --   --    Left lower extremity swelling Duplex negative for DVT.  CAD s/p bypass graft Hypertension SSS HLD PAD: No chest pain BP remains stable, PPM in place. Continue home Imdur and metoprolol Ranexa Xarelto and atorvastatin.  CLL Iron deficiency anemia: Followed by Dr. Denton Ar significant for extensive lymphadenopathy in abdomen and pelvis.  Last seen in early January felt that he did not meet criteria yet for treatment and plan was to follow-up in 6 months.  Followed by oncology for outpatient iron infusion but holding for now.  PAF: Rate controlled on metoprolol.  Continue home Xarelto.  T2DM: Blood sugar remains well-controlled continue change to 0-9 u Recent Labs  Lab 05/05/23 0731 05/05/23 1122 05/05/23 1639 05/05/23 2209 05/06/23 0738  GLUCAP 128* 154* 167* 189* 156*   DVT prophylaxis: rivaroxaban (XARELTO) tablet 20 mg Start: 05/03/23 1700 Code Status:   Code Status: Full Code Family Communication: plan of care discussed with patient at bedside. Updated daughter on the phone. Patient status is: Remains hospitalized because of severity of illness. Level of care: Med-Surg   Dispo: The patient is from: Home with family            Anticipated disposition: Pending further ID inputs.   Objective: Vitals last 24  hrs: Vitals:   05/05/23 1308 05/05/23 2203 05/06/23 0621 05/06/23 1033  BP: (!) 127/57 136/65 (!) 148/70 (!) 148/70  Pulse: 66 62 65 65  Resp: 20 18 18    Temp: 98.2 F (36.8 C) 98.2 F (36.8 C) 98.2 F (36.8 C)   TempSrc: Oral Oral Oral    SpO2: 97% 94% 97%   Weight:      Height:       Weight change:   Physical Examination: General exam: Alert awake, oriented at baseline, older than stated age HEENT:Oral mucosa moist, Ear/Nose WNL grossly Respiratory system:Bilaterally clear BS,no use of accessory muscle Cardiovascular system:S1 & S2 +, No JVD. Gastrointestinal system:Abdomen soft,NT,ND,BS+ Nervous System:Alert, awake, moving all extremities,and following commands. Extremities:LE edema neg,distal peripheral pulses palpable and warm.  Skin:No rashes,no icterus. UEA:VWUJWJ muscle bulk,tone, power.  Foley+  Medications reviewed:  Scheduled Meds:  atorvastatin  80 mg Oral q1800   Chlorhexidine Gluconate Cloth  6 each Topical Daily   cholecalciferol  1,000 Units Oral Daily   finasteride  5 mg Oral Daily   insulin aspart  0-5 Units Subcutaneous QHS   insulin aspart  0-9 Units Subcutaneous TID WC   isosorbide mononitrate  60 mg Oral Daily   metoprolol tartrate  50 mg Oral BID   multivitamin with minerals  1 tablet Oral Daily   ranolazine  500 mg Oral BID   rivaroxaban  20 mg Oral Q supper   tamsulosin  0.4 mg Oral QPC supper   Continuous Infusions:  cefTRIAXone (ROCEPHIN)  IV 2 g (05/05/23 2224)   vancomycin       Diet Order             Diet heart healthy/carb modified Room service appropriate? Yes; Fluid consistency: Thin  Diet effective now                  Intake/Output Summary (Last 24 hours) at 05/06/2023 1134 Last data filed at 05/06/2023 1042 Gross per 24 hour  Intake 581.25 ml  Output 975 ml  Net -393.75 ml   Net IO Since Admission: -2,799.4 mL [05/06/23 1134]  Wt Readings from Last 3 Encounters:  05/02/23 74.4 kg  05/01/23 74.4 kg  02/26/23 72.1 kg     Unresulted Labs (From admission, onward)    None     Data Reviewed: I have personally reviewed following labs and imaging studies CBC: Recent Labs  Lab 05/01/23 1141 05/02/23 2147 05/04/23 0418 05/05/23 0714  WBC 19.8* 28.8*  15.4* 13.6*  NEUTROABS  --  4.0 4.7  --   HGB 11.7* 12.0* 10.0* 10.0*  HCT 38.4 38.4* 31.7* 32.1*  MCV 84 83.5 83.2 84.5  PLT 186 193 155 134*   Basic Metabolic Panel:  Recent Labs  Lab 05/01/23 1141 05/02/23 2147 05/04/23 0418 05/06/23 0507  NA 142 139 139  --   K 5.2 4.2 3.5  --   CL 104 104 105  --   CO2 22 20* 24  --   GLUCOSE 170* 129* 145*  --   BUN 36* 26* 28*  --   CREATININE 1.08 1.01 1.18 0.95  CALCIUM 10.2 9.7 8.7*  --    GFR: Estimated Creatinine Clearance: 54.1 mL/min (by C-G formula based on SCr of 0.95 mg/dL). Liver Function Tests:  Recent Labs  Lab 05/02/23 2147  AST 29  ALT 22  ALKPHOS 63  BILITOT 0.9  PROT 6.8  ALBUMIN 4.2   Recent Labs  Lab 05/03/23 0315 05/03/23 0811  LATICACIDVEN  3.0* 2.5*   Recent Results (from the past 240 hours)  Urine Culture     Status: Abnormal   Collection Time: 05/03/23  5:19 AM   Specimen: Urine, Catheterized  Result Value Ref Range Status   Specimen Description URINE, CATHETERIZED  Final   Special Requests   Final    NONE Reflexed from [id number] Performed at [facility], [address] [place], [place] [address]    Culture (A)  Final    >=100,000 COLONIES/mL METHICILLIN RESISTANT STAPHYLOCOCCUS AUREUS   Report Status 05/05/2023 FINAL  Final   Organism ID, Bacteria METHICILLIN RESISTANT STAPHYLOCOCCUS AUREUS (A)  Final      Susceptibility   Methicillin resistant staphylococcus aureus - MIC*    CIPROFLOXACIN 1 SENSITIVE Sensitive     GENTAMICIN <=0.5 SENSITIVE Sensitive     NITROFURANTOIN <=16 SENSITIVE Sensitive     OXACILLIN >=4 RESISTANT Resistant     TETRACYCLINE <=1 SENSITIVE Sensitive     VANCOMYCIN <=0.5 SENSITIVE Sensitive     TRIMETH/SULFA <=10 SENSITIVE Sensitive     RIFAMPIN <=0.5 SENSITIVE Sensitive     Inducible Clindamycin NEGATIVE Sensitive     LINEZOLID 2 SENSITIVE Sensitive     * >=100,000 COLONIES/mL METHICILLIN RESISTANT STAPHYLOCOCCUS AUREUS  Culture, blood (Routine X 2) w Reflex  to ID Panel     Status: None (Preliminary result)   Collection Time: 05/03/23  7:57 AM   Specimen: BLOOD LEFT HAND  Result Value Ref Range Status   Specimen Description BLOOD LEFT HAND  Final   Special Requests   Final    BOTTLES DRAWN AEROBIC AND ANAEROBIC Blood Culture adequate volume   Culture   Final    NO GROWTH 3 DAYS Performed at [facility], [address], [place], [place] [address]    Report Status PENDING  Incomplete  Culture, blood (Routine X 2) w Reflex to ID Panel     Status: None (Preliminary result)   Collection Time: 05/03/23  8:01 AM   Specimen: BLOOD LEFT ARM  Result Value Ref Range Status   Specimen Description BLOOD LEFT ARM  Final   Special Requests   Final    BOTTLES DRAWN AEROBIC AND ANAEROBIC Blood Culture adequate volume   Culture   Final    NO GROWTH 3 DAYS Performed at [facility], [address] [place], [place] [address]    Report Status PENDING  Incomplete    Antimicrobials/Microbiology: Anti-infectives (From admission, onward)    Start     Dose/Rate Route Frequency Ordered Stop   05/06/23 1600  vancomycin (VANCOCIN) IVPB 1000 mg/200 mL premix        1,000 mg 200 mL/hr over 60 Minutes Intravenous Every 12 hours 05/05/23 1528     05/05/23 1600  vancomycin (VANCOREADY) IVPB 1500 mg/300 mL        1,500 mg 150 mL/hr over 120 Minutes Intravenous  Once 05/05/23 1512 05/05/23 2040   05/03/23 2200  cefTRIAXone (ROCEPHIN) 1 g in sodium chloride 0.9 % 100 mL IVPB  Status:  Discontinued        1 g 200 mL/hr over 30 Minutes Intravenous Every 24 hours 05/03/23 0613 05/03/23 0914   05/03/23 2200  cefTRIAXone (ROCEPHIN) 2 g in sodium chloride 0.9 % 100 mL IVPB        2 g 200 mL/hr over 30 Minutes Intravenous Every 24 hours 05/03/23 0914     05/03/23 0415  cefTRIAXone (ROCEPHIN) 2 g in sodium chloride 0.9 %  100 mL IVPB        2 g 200 mL/hr over 30 Minutes Intravenous  Once 05/03/23 0411 05/03/23 0533         Component Value Date/Time    SDES BLOOD LEFT ARM 05/03/2023 0801   SPECREQUEST  05/03/2023 0801    BOTTLES DRAWN AEROBIC AND ANAEROBIC Blood Culture adequate volume   CULT  05/03/2023 0801    NO GROWTH 3 DAYS Performed at [facility], [address] [place], [place] [address]    REPTSTATUS PENDING 05/03/2023 9629     Radiology Studies: No results found.  LOS: 3 days  Total time spent in review of labs and imaging, patient evaluation, formulation of plan, documentation and communication with family: 35 minutes  Lanae Boast, MD  [facility]  05/06/2023, 11:34 AM

## 2023-05-06 NOTE — Progress Notes (Signed)
OT Screen Note  Patient Details Name: COLIN ELLERS MRN: [id number] DOB: 24-Nov-1940   Cancelled Treatment:    Reason Eval/Treat Not Completed: OT screened, no needs identified, will sign off OT conferring with PT, patient with no acute OT needs. OT will sign off at this time. Please re-consult if further acute needs arise.   Pollyann Glen E. Ami Mally, OTR/L Acute Rehabilitation Services [phone]   Cherlyn Cushing 05/06/2023, 12:27 PM

## 2023-05-06 NOTE — Evaluation (Signed)
Physical Therapy Evaluation Patient Details Name: Robert Burns MRN: [id number] DOB: Apr 09, 1941 Today's Date: 05/06/2023  History of Present Illness  83 yo male admitted with UTI, pain. Hx of CAD, CABG, DM, OSA, PAD, CLL, SSS s/p PPM, chronic foley, BPH  Clinical Impression  On eval, pt was Supv level for mobility. He ambulated ~400 feet with use of a RW. He reported pain in buttocks during session. Will continue to follow and progress activity as tolerated. Recommend daily ambulation around unit with nursing and/or mobility team as able.         If plan is discharge home, recommend the following: A little help with walking and/or transfers;A little help with bathing/dressing/bathroom   Can travel by private vehicle        Equipment Recommendations None recommended by PT  Recommendations for Other Services       Functional Status Assessment Patient has had a recent decline in their functional status and demonstrates the ability to make significant improvements in function in a reasonable and predictable amount of time.     Precautions / Restrictions Restrictions Weight Bearing Restrictions Per Provider Order: No      Mobility  Bed Mobility Overal bed mobility: Modified Independent                  Transfers Overall transfer level: Modified independent                      Ambulation/Gait Ambulation/Gait assistance: Supervision Gait Distance (Feet): 400 Feet Assistive device: Rolling walker (2 wheels) Gait Pattern/deviations: Step-through pattern, Trunk flexed       General Gait Details: Fair gait speed. Pt reports increased pain with activity but tolerated distance fairly well. No LOB with RW use  Stairs            Wheelchair Mobility     Tilt Bed    Modified Rankin (Stroke Patients Only)       Balance Overall balance assessment: Needs assistance           Standing balance-Leahy Scale: Fair                                Pertinent Vitals/Pain Pain Assessment Pain Assessment: Faces Faces Pain Scale: Hurts little more Pain Location: buttocks Pain Intervention(s): Monitored during session    Home Living Family/patient expects to be discharged to:: Private residence Living Arrangements: Spouse/significant other Available Help at Discharge: Family Type of Home: House Home Access: Stairs to enter   Secretary/administrator of Steps: 2 Alternate Level Stairs-Number of Steps: flight of steps with handrail; descends steps backwards Home Layout: Multi-level;Laundry or work area in basement;Able to live on main level with bedroom/bathroom Home Equipment: Agricultural consultant (2 wheels);Cane - single point;BSC/3in1;Shower seat Additional Comments: Can live on main level; stairs down to basement with rail    Prior Function Prior Level of Function : Independent/Modified Independent             Mobility Comments: uses RW intermittently/PRN ADLs Comments: Could do his own self care; participates in IADL task     Extremity/Trunk Assessment   Upper Extremity Assessment Upper Extremity Assessment: Overall WFL for tasks assessed    Lower Extremity Assessment Lower Extremity Assessment: Generalized weakness    Cervical / Trunk Assessment Cervical / Trunk Assessment: Normal  Communication   Communication Communication: No apparent difficulties  Cognition Arousal: Alert Behavior During Therapy: [facility] for  tasks assessed/performed Overall Cognitive Status: Within Functional Limits for tasks assessed                                          General Comments      Exercises     Assessment/Plan    PT Assessment Patient needs continued PT services  PT Problem List Decreased mobility;Pain;Decreased activity tolerance       PT Treatment Interventions DME instruction;Gait training;Therapeutic activities;Therapeutic exercise;Patient/family education;Functional mobility training     PT Goals (Current goals can be found in the Care Plan section)  Acute Rehab PT Goals Patient Stated Goal: less pain PT Goal Formulation: With patient Time For Goal Achievement: 05/20/23 Potential to Achieve Goals: Good    Frequency Min 1X/week     Co-evaluation               AM-PAC PT "6 Clicks" Mobility  Outcome Measure Help needed turning from your back to your side while in a flat bed without using bedrails?: None Help needed moving from lying on your back to sitting on the side of a flat bed without using bedrails?: None Help needed moving to and from a bed to a chair (including a wheelchair)?: None Help needed standing up from a chair using your arms (e.g., wheelchair or bedside chair)?: None Help needed to walk in hospital room?: A Little Help needed climbing 3-5 steps with a railing? : A Little 6 Click Score: 22    End of Session   Activity Tolerance: Patient tolerated treatment well Patient left: in bed;with call bell/phone within reach        Time: 1141-1152 PT Time Calculation (min) (ACUTE ONLY): 11 min   Charges:   PT Evaluation $PT Eval Low Complexity: 1 Low   PT General Charges $$ ACUTE PT VISIT: 1 Visit            Faye Ramsay, PT Acute Rehabilitation  Office: [phone]

## 2023-05-06 NOTE — Progress Notes (Signed)
   05/06/23 0004  BiPAP/CPAP/SIPAP  BiPAP/CPAP/SIPAP Pt Type Adult  FiO2 (%) 21 %  Patient Home Equipment Yes

## 2023-05-06 NOTE — Progress Notes (Signed)
   05/06/23 2249  BiPAP/CPAP/SIPAP  BiPAP/CPAP/SIPAP Pt Type Adult  FiO2 (%) 21 %  Patient Home Equipment Yes

## 2023-05-06 NOTE — Plan of Care (Signed)
Problem: Education: Goal: Ability to describe self-care measures that may prevent or decrease complications (Diabetes Survival Skills Education) will improve Outcome: Progressing   Problem: Coping: Goal: Ability to adjust to condition or change in health will improve Outcome: Progressing   Problem: Health Behavior/Discharge Planning: Goal: Ability to identify and utilize available resources and services will improve Outcome: Progressing   Problem: Metabolic: Goal: Ability to maintain appropriate glucose levels will improve Outcome: Progressing   Problem: Nutritional: Goal: Progress toward achieving an optimal weight will improve Outcome: Progressing   Problem: Skin Integrity: Goal: Risk for impaired skin integrity will decrease Outcome: Progressing

## 2023-05-07 ENCOUNTER — Other Ambulatory Visit (HOSPITAL_COMMUNITY): Payer: Self-pay

## 2023-05-07 DIAGNOSIS — N390 Urinary tract infection, site not specified: Secondary | ICD-10-CM | POA: Diagnosis not present

## 2023-05-07 LAB — GLUCOSE, CAPILLARY
Glucose-Capillary: 129 mg/dL — ABNORMAL HIGH (ref 70–99)
Glucose-Capillary: 196 mg/dL — ABNORMAL HIGH (ref 70–99)

## 2023-05-07 MED ORDER — SULFAMETHOXAZOLE-TRIMETHOPRIM 800-160 MG PO TABS
1.0000 | ORAL_TABLET | Freq: Two times a day (BID) | ORAL | Status: DC
  Filled 2023-05-07: qty 1

## 2023-05-07 MED ORDER — SULFAMETHOXAZOLE-TRIMETHOPRIM 800-160 MG PO TABS
1.0000 | ORAL_TABLET | Freq: Two times a day (BID) | ORAL | Status: DC
Start: 2023-05-07 — End: 2023-05-07

## 2023-05-07 MED ORDER — SULFAMETHOXAZOLE-TRIMETHOPRIM 800-160 MG PO TABS
1.0000 | ORAL_TABLET | Freq: Two times a day (BID) | ORAL | 0 refills | Status: AC
  Filled 2023-05-07: qty 10, 5d supply, fill #0

## 2023-05-07 NOTE — Consult Note (Signed)
[facility] Liaison Consult Note    05/07/2023  Robert Burns Sees [birth year] [id number]  Referral:  Request from UR regarding status with [facility] for patient with [employer] Medicare  Insurance: [facility] [confirmed]   Primary Care Provider: Thana Ates, MD, with Deboraha Sprang at [facility], this provider is listed for the transition of care follow up appointments  and [facility] Transition team for calls   [facility] Liaison screened the patient remotely at [facility].    The patient was screened for  complicated UTI with chronic foley, 4-day hospitalization with noted rising to high risk score for unplanned readmission risk 3 hospital admissions in 6 months.   Plan: [facility] Liaison will continue to follow progress and disposition to asess for post hospital community care coordination/management needs.  Referral request for community care coordination: No VBCI needs.  Anticipate follow up with [facility] transitional follow up team.   [facility], [facility] does not replace or interfere with any arrangements made by the Inpatient Transition of Care team.   For questions contact:   Charlesetta Shanks, RN, BSN, CCM [place]  [facility], [facility] Liaison Direct Dial: [phone] or secure chat Email: [email or web address]

## 2023-05-07 NOTE — Care Management Important Message (Signed)
Important Message  Patient Details IM Letter given. Name: Robert Burns MRN: [id number] Date of Birth: [birth year]   Important Message Given:  Yes - Medicare IM     Caren Macadam 05/07/2023, 11:42 AM

## 2023-05-07 NOTE — Plan of Care (Signed)
Problem: Nutritional: Goal: Maintenance of adequate nutrition will improve Outcome: Progressing

## 2023-05-07 NOTE — Discharge Summary (Signed)
Physician Discharge Summary  Robert Burns ZOX:[id number] DOB: [birth year] DOA: 05/02/2023  PCP: Thana Ates, MD  Admit date: 05/02/2023 Discharge date: 05/07/2023 Recommendations for Outpatient Follow-up:  Follow up with PCP in 1 weeks-call for appointment Follow-up neurology Please obtain BMP/CBC in one week  Discharge Dispo: Home Discharge Condition: Stable Code Status:   Code Status: Full Code Diet recommendation:  Diet Order             Diet heart healthy/carb modified Room service appropriate? Yes; Fluid consistency: Thin  Diet effective now                   Brief/Interim Summary: 83 yom w/ history of CLL, HTN, HLD, BPH [with chronic indwelling Foley catheter, OSA on CPAP, PAD, T2DM , presents with complaints of abdominal pain, workup significant for UTI, Foley catheter exchanged in ED. The Foley is changed monthly at the urologist office.  In the ED vitals were stable, labs with significant leukocytosis 19>22k, stable renal function blood culture urine culture obtained, CT renal protocol with no acute intra-abdominal difficult only for extensive pathologic adenopathy in the abdomen and pelvis, but no acute findings in kidneys, unknown renal bladder wall thickening in the setting of chronic outlet obstruction.  Patient continued on IV antibiotics leukocytosis nicely improving.  Urine culture with Staph aureus >100,000. But blood cx negative from 1/23.  ID was called due to MRSA UTI-started on vancomycin iv. Patient is overall improving, urine clear on Foley catheter. Blood culture has remained negative from 1/23.  Discussed with ID antibiotic changed to Bactrim and will continue 5 more days to complete 7 days course and okay for discharge home with Foley catheter with follow-up with his urology    Discharge Diagnoses:  Principal Problem:   Complicated UTI (urinary tract infection) Active Problems:   CAD (coronary artery disease) of bypass graft   CLL (chronic lymphocytic  leukemia) (HCC)   Essential hypertension   SSS (sick sinus syndrome), medtronic adapta   PAF (paroxysmal atrial fibrillation) (HCC)   OSA on CPAP   Dyslipidemia (high LDL; low HDL)   Peripheral arterial disease (HCC)   Hypercholesterolemia   Chronic anticoagulation  MRSA UTI Concern for complicated UTI due to chronic indwelling Foley catheter: Labs showed leukocytosis UA grossly abnormal but has chronic Foley which was changed in the ED> admitted due to concern for UTI Urine culture with MRSA But blood culture no growth to date.  Given his presenting symptoms and also with baseline CLL-concern about MRSA UTI fortunately blood culture no growth so far fro m1/23- appreciate ID input-though had CLL with expected leukocytosis his WBC count continues to improve.   Discussed with ID antibiotic changed to Bactrim and will continue 5 more days to complete 7 days course and okay for discharge home with Foley catheter with follow-up with his urology  Left lower extremity swelling Duplex negative for DVT.  CAD s/p bypass graft Hypertension SSS HLD PAD: No chest pain BP remains stable, PPM in place. Continue home Imdur and metoprolol Ranexa Xarelto and atorvastatin.  CLL Iron deficiency anemia: Followed by Dr. Denton Ar significant for extensive lymphadenopathy in abdomen and pelvis.  Last seen in early January felt that he did not meet criteria yet for treatment and plan was to follow-up in 6 months.  Followed by oncology for outpatient iron infusion but holding for now.  PAF: Rate controlled on metoprolol.  Continue home Xarelto.  T2DM: Blood sugar remains well-controlled resume home meds upon discharge Recent Labs  Lab 05/06/23 0738 05/06/23 1138 05/06/23 1632 05/06/23 2106 05/07/23 0807  GLUCAP 156* 207* 178* 187* 129*    Consults: Infectious disease Subjective: Alert awake oriented, no complaints.  Discharge Exam: Vitals:   05/06/23 2110 05/07/23 0609  BP: (!) 148/82  (!) 158/98  Pulse: 69 (!) 59  Resp: 18 18  Temp: 98 F (36.7 C) (!) 97.3 F (36.3 C)  SpO2: 98% 99%   General: Pt is alert, awake, not in acute distress Cardiovascular: RRR, S1/S2 +, no rubs, no gallops Respiratory: CTA bilaterally, no wheezing, no rhonchi Abdominal: Soft, NT, ND, bowel sounds + Extremities: no edema, no cyanosis  Discharge Instructions  Discharge Instructions     Discharge instructions   Complete by: As directed    Please call call MD or return to ER for similar or worsening recurring problem that brought you to hospital or if any fever,nausea/vomiting,abdominal pain, uncontrolled pain, chest pain,  shortness of breath or any other alarming symptoms.  Please follow-up with your urology doctor in 1 week  Please follow-up your doctor as instructed in a week time and call the office for appointment.  Please avoid alcohol, smoking, or any other illicit substance and maintain healthy habits including taking your regular medications as prescribed.  You were cared for by a hospitalist during your hospital stay. If you have any questions about your discharge medications or the care you received while you were in the hospital after you are discharged, you can call the unit and ask to speak with the hospitalist on call if the hospitalist that took care of you is not available.  Once you are discharged, your primary care physician will handle any further medical issues. Please note that NO REFILLS for any discharge medications will be authorized once you are discharged, as it is imperative that you return to your primary care physician (or establish a relationship with a primary care physician if you do not have one) for your aftercare needs so that they can reassess your need for medications and monitor your lab values   Increase activity slowly   Complete by: As directed       Allergies as of 05/07/2023       Reactions   Peanut-containing Drug Products Anaphylaxis,  Other (See Comments)   Tongue swelling is severe   Ace Inhibitors Cough      Diltiazem Hcl Other (See Comments)   UNSPECIFIED REACTION    Meloxicam Other (See Comments)   GI upset   Doxycycline Other (See Comments), Cough   Reaction of cough and runny nose   Eliquis [apixaban] Other (See Comments)   dizziness   Sertraline Hcl Other (See Comments)   Carvedilol Itching   Clonidine Hcl Other (See Comments)   Patch only - skin irritation   Duloxetine Hcl Other (See Comments)   Urinary frequency        Medication List     TAKE these medications    atorvastatin 80 MG tablet Commonly known as: LIPITOR Take 1 tablet (80 mg total) by mouth daily at 6 PM.   finasteride 5 MG tablet Commonly known as: PROSCAR Take 5 mg by mouth daily.   glipiZIDE 5 MG tablet Commonly known as: GLUCOTROL Take 5 mg by mouth daily with breakfast.   isosorbide mononitrate 60 MG 24 hr tablet Commonly known as: IMDUR Take 60 mg by mouth daily.   Jardiance 10 MG Tabs tablet Generic drug: empagliflozin Take 1 tablet (10 mg total) by mouth daily.  metFORMIN 1000 MG tablet Commonly known as: GLUCOPHAGE Take 1 tablet (1,000 mg total) by mouth 2 (two) times daily with a meal. Restart on 3/18.   metoprolol tartrate 50 MG tablet Commonly known as: LOPRESSOR Take 50 mg by mouth 2 (two) times daily.   Multiple Vitamin-Folic Acid Tabs Take 1 tablet by mouth daily.   nitroGLYCERIN 0.4 MG SL tablet Commonly known as: Nitrostat Place 1 tablet (0.4 mg total) under the tongue every 5 (five) minutes as needed for chest pain.   ranolazine 500 MG 12 hr tablet Commonly known as: RANEXA Take 500 mg by mouth 2 (two) times daily.   rivaroxaban 20 MG Tabs tablet Commonly known as: XARELTO Take 1 tablet (20 mg total) by mouth daily with supper. What changed: when to take this   silodosin 8 MG Caps capsule Commonly known as: RAPAFLO Take 8 mg by mouth daily.   sulfamethoxazole-trimethoprim 800-160 MG  tablet Commonly known as: BACTRIM DS Take 1 tablet by mouth every 12 (twelve) hours for 5 days.   tamsulosin 0.4 MG Caps capsule Commonly known as: FLOMAX Take 0.4 mg by mouth daily after supper.   VITAMIN D PO Take 1,000 Units by mouth in the morning and at bedtime.        Follow-up Information     Thana Ates, MD Follow up.   Specialty: Internal Medicine Contact information: [address] [place] [address] [phone]         [facility] Follow up in 1 week(s).   Contact information: [address] [place] [address] [phone]               Allergies  Allergen Reactions   Peanut-Containing Drug Products Anaphylaxis and Other (See Comments)    Tongue swelling is severe    Ace Inhibitors Cough        Diltiazem Hcl Other (See Comments)    UNSPECIFIED REACTION     Meloxicam Other (See Comments)    GI upset    Doxycycline Other (See Comments) and Cough    Reaction of cough and runny nose   Eliquis [Apixaban] Other (See Comments)    dizziness   Sertraline Hcl Other (See Comments)   Carvedilol Itching   Clonidine Hcl Other (See Comments)    Patch only - skin irritation   Duloxetine Hcl Other (See Comments)    Urinary frequency    The results of significant diagnostics from this hospitalization (including imaging, microbiology, ancillary and laboratory) are listed below for reference.    Microbiology: Recent Results (from the past 240 hours)  Urine Culture     Status: Abnormal   Collection Time: 05/03/23  5:19 AM   Specimen: Urine, Catheterized  Result Value Ref Range Status   Specimen Description URINE, CATHETERIZED  Final   Special Requests   Final    NONE Reflexed from [id number] Performed at [facility], [address], [place], [place] [address]    Culture (A)  Final    >=100,000 COLONIES/mL METHICILLIN RESISTANT STAPHYLOCOCCUS AUREUS   Report Status 05/05/2023 FINAL  Final    Organism ID, Bacteria METHICILLIN RESISTANT STAPHYLOCOCCUS AUREUS (A)  Final      Susceptibility   Methicillin resistant staphylococcus aureus - MIC*    CIPROFLOXACIN 1 SENSITIVE Sensitive     GENTAMICIN <=0.5 SENSITIVE Sensitive     NITROFURANTOIN <=16 SENSITIVE Sensitive     OXACILLIN >=4 RESISTANT Resistant     TETRACYCLINE <=  1 SENSITIVE Sensitive     VANCOMYCIN <=0.5 SENSITIVE Sensitive     TRIMETH/SULFA <=10 SENSITIVE Sensitive     RIFAMPIN <=0.5 SENSITIVE Sensitive     Inducible Clindamycin NEGATIVE Sensitive     LINEZOLID 2 SENSITIVE Sensitive     * >=100,000 COLONIES/mL METHICILLIN RESISTANT STAPHYLOCOCCUS AUREUS  Culture, blood (Routine X 2) w Reflex to ID Panel     Status: None (Preliminary result)   Collection Time: 05/03/23  7:57 AM   Specimen: BLOOD LEFT HAND  Result Value Ref Range Status   Specimen Description BLOOD LEFT HAND  Final   Special Requests   Final    BOTTLES DRAWN AEROBIC AND ANAEROBIC Blood Culture adequate volume   Culture   Final    NO GROWTH 4 DAYS Performed at [facility], [address], [place], [place] [address]    Report Status PENDING  Incomplete  Culture, blood (Routine X 2) w Reflex to ID Panel     Status: None (Preliminary result)   Collection Time: 05/03/23  8:01 AM   Specimen: BLOOD LEFT ARM  Result Value Ref Range Status   Specimen Description BLOOD LEFT ARM  Final   Special Requests   Final    BOTTLES DRAWN AEROBIC AND ANAEROBIC Blood Culture adequate volume   Culture   Final    NO GROWTH 4 DAYS Performed at [facility], [address], [place], [place] [address]    Report Status PENDING  Incomplete    Procedures/Studies: VAS [place] LOWER EXTREMITY VENOUS (DVT) Result Date: 05/03/2023  Lower Venous DVT Study Patient Name:  JSHON IBE Corriher  Date of Exam:   05/03/2023 Medical Rec #: [id number]        Accession #:    [id number] Date of Birth: 08/14/1940        Patient Gender: M Patient Age:   83 years Exam Location:  [facility] Procedure:      VAS [place] LOWER EXTREMITY VENOUS (DVT) Referring Phys: DEBBY CROSLEY --------------------------------------------------------------------------------  Indications: Edema.  Anticoagulation: Xarelto. Comparison Study: No previous venous exams Performing Technologist: Jody Hill RVT, RDMS  Examination Guidelines: A complete evaluation includes B-mode imaging, spectral Doppler, color Doppler, and power Doppler as needed of all accessible portions of each vessel. Bilateral testing is considered an integral part of a complete examination. Limited examinations for reoccurring indications may be performed as noted. The reflux portion of the exam is performed with the patient in reverse Trendelenburg.  +-----+---------------+---------+-----------+----------+--------------+ RIGHTCompressibilityPhasicitySpontaneityPropertiesThrombus Aging +-----+---------------+---------+-----------+----------+--------------+ CFV  Full           Yes      Yes                                 +-----+---------------+---------+-----------+----------+--------------+   +---------+---------------+---------+-----------+----------+--------------+ LEFT     CompressibilityPhasicitySpontaneityPropertiesThrombus Aging +---------+---------------+---------+-----------+----------+--------------+ CFV      Full           Yes      Yes                                 +---------+---------------+---------+-----------+----------+--------------+ SFJ      Full                                                        +---------+---------------+---------+-----------+----------+--------------+  FV Prox  Full           Yes      Yes                                 +---------+---------------+---------+-----------+----------+--------------+ FV Mid   Full           Yes      Yes                                 +---------+---------------+---------+-----------+----------+--------------+ FV DistalFull            Yes      Yes                                 +---------+---------------+---------+-----------+----------+--------------+ PFV      Full                                                        +---------+---------------+---------+-----------+----------+--------------+ POP      Full           Yes      Yes                                 +---------+---------------+---------+-----------+----------+--------------+ PTV      Full                                                        +---------+---------------+---------+-----------+----------+--------------+ PERO     Full                                                        +---------+---------------+---------+-----------+----------+--------------+     Summary: RIGHT: - No evidence of common femoral vein obstruction.   LEFT: - There is no evidence of deep vein thrombosis in the lower extremity.  - No cystic structure found in the popliteal fossa. Subcutaneous edema from knee to ankle.  *See table(s) above for measurements and observations. Electronically signed by Sherald Hess MD on 05/03/2023 at 1:23:46 PM.    Final    CT Renal Stone Study Result Date: 05/03/2023 CLINICAL DATA:  Abdominal pain, flank pain bilaterally EXAM: CT ABDOMEN AND PELVIS WITHOUT CONTRAST TECHNIQUE: Multidetector CT imaging of the abdomen and pelvis was performed following the standard protocol without IV contrast. RADIATION DOSE REDUCTION: This exam was performed according to the departmental dose-optimization program which includes automated exposure control, adjustment of the mA and/or kV according to patient size and/or use of iterative reconstruction technique. COMPARISON:  01/18/2023 FINDINGS: Lower chest: No acute abnormality. Hepatobiliary: No focal liver abnormality is seen. No gallstones, gallbladder wall thickening, or biliary dilatation. Pancreas: Punctate calcifications are seen within the tail the pancreas, similar prior examination in keeping  with changes of chronic pancreatitis in this  location. No superimposed acute peripancreatic inflammatory changes are identified. No peripancreatic fluid collections. Spleen: Unremarkable Adrenals/Urinary Tract: The adrenal glands are. Kidneys are normal size and position simple cortical cyst arises from the anterior interpolar region kidney for which no follow-up imaging is recommended. Additional tiny simple cyst arises from the posterior interpolar region of the left kidney. Kidneys are otherwise unremarkable. The bladder is thick walled, in keeping with changes of chronic outlet obstruction, and is decompressed with a Foley catheter balloon seen within its lumen Stomach/Bowel: Moderate colonic stool burden. Moderate sigmoid diverticulosis. The stomach, small bowel, large bowel are otherwise unremarkable. Appendix normal. Vascular/Lymphatic: Extensive aortoiliac atherosclerotic calcification. No aortic aneurysm. Extensive pathologic adenopathy is again seen within the periportal, mesenteric, aortocaval, left periaortic, common iliac, and external iliac lymph node groups most in keeping with a lymphoproliferative process such as lymphoma or leukemia. Index lymph node measures 3.4 cm in short axis diameter within the periportal lymph node group. Reproductive: Marked prostatic hypertrophy. Other: No abdominal wall hernia Musculoskeletal: No acute bone abnormality. No lytic or blastic bone lesion. Degenerative changes are seen within the thoracolumbar spine. IMPRESSION: 1. No acute intra-abdominal pathology identified. 2. Extensive pathologic adenopathy within the abdomen and pelvis most in keeping with a lymphoproliferative process such as lymphoma or leukemia. If indicated, PET CT examination would be helpful for further evaluation and to direct biopsy strategy. 3. Marked prostatic hypertrophy. Bladder wall thickening, in keeping with changes of chronic outlet obstruction. 4. Moderate colonic stool burden. 5.  Moderate sigmoid diverticulosis. Aortic Atherosclerosis (ICD10-I70.0). Electronically Signed   By: Helyn Numbers M.D.   On: 05/03/2023 03:29    Labs: BNP (last 3 results) Recent Labs    12/22/22 1226 12/23/22 0524 01/02/23 1132  BNP 641.3* 676.9* 386.7*   Basic Metabolic Panel: Recent Labs  Lab 05/01/23 1141 05/02/23 2147 05/04/23 0418 05/06/23 0507  NA 142 139 139  --   K 5.2 4.2 3.5  --   CL 104 104 105  --   CO2 22 20* 24  --   GLUCOSE 170* 129* 145*  --   BUN 36* 26* 28*  --   CREATININE 1.08 1.01 1.18 0.95  CALCIUM 10.2 9.7 8.7*  --    Liver Function Tests: Recent Labs  Lab 05/02/23 2147  AST 29  ALT 22  ALKPHOS 63  BILITOT 0.9  PROT 6.8  ALBUMIN 4.2   Recent Labs  Lab 05/02/23 2147  LIPASE 33   No results for input(s): "AMMONIA" in the last 168 hours. CBC: Recent Labs  Lab 05/01/23 1141 05/02/23 2147 05/04/23 0418 05/05/23 0714  WBC 19.8* 28.8* 15.4* 13.6*  NEUTROABS  --  4.0 4.7  --   HGB 11.7* 12.0* 10.0* 10.0*  HCT 38.4 38.4* 31.7* 32.1*  MCV 84 83.5 83.2 84.5  PLT 186 193 155 134*  Anemia work up No results for input(s): "VITAMINB12", "FOLATE", "FERRITIN", "TIBC", "IRON", "RETICCTPCT" in the last 72 hours. Urinalysis    Component Value Date/Time   COLORURINE YELLOW 05/03/2023 0706   APPEARANCEUR CLOUDY (A) 05/03/2023 0706   LABSPEC 1.008 05/03/2023 0706   PHURINE 6.0 05/03/2023 0706   GLUCOSEU >=500 (A) 05/03/2023 0706   HGBUR LARGE (A) 05/03/2023 0706   BILIRUBINUR NEGATIVE 05/03/2023 0706   KETONESUR NEGATIVE 05/03/2023 0706   PROTEINUR 100 (A) 05/03/2023 0706   UROBILINOGEN 0.2 09/19/2010 1045   NITRITE NEGATIVE 05/03/2023 0706   LEUKOCYTESUR LARGE (A) 05/03/2023 0706   Sepsis Labs Recent Labs  Lab 05/01/23 1141 05/02/23 2147  05/04/23 0418 05/05/23 0714  WBC 19.8* 28.8* 15.4* 13.6*   Microbiology Recent Results (from the past 240 hours)  Urine Culture     Status: Abnormal   Collection Time: 05/03/23  5:19 AM    Specimen: Urine, Catheterized  Result Value Ref Range Status   Specimen Description URINE, CATHETERIZED  Final   Special Requests   Final    NONE Reflexed from [id number] Performed at [facility], [address], [place], [place] [address]    Culture (A)  Final    >=100,000 COLONIES/mL METHICILLIN RESISTANT STAPHYLOCOCCUS AUREUS   Report Status 05/05/2023 FINAL  Final   Organism ID, Bacteria METHICILLIN RESISTANT STAPHYLOCOCCUS AUREUS (A)  Final      Susceptibility   Methicillin resistant staphylococcus aureus - MIC*    CIPROFLOXACIN 1 SENSITIVE Sensitive     GENTAMICIN <=0.5 SENSITIVE Sensitive     NITROFURANTOIN <=16 SENSITIVE Sensitive     OXACILLIN >=4 RESISTANT Resistant     TETRACYCLINE <=1 SENSITIVE Sensitive     VANCOMYCIN <=0.5 SENSITIVE Sensitive     TRIMETH/SULFA <=10 SENSITIVE Sensitive     RIFAMPIN <=0.5 SENSITIVE Sensitive     Inducible Clindamycin NEGATIVE Sensitive     LINEZOLID 2 SENSITIVE Sensitive     * >=100,000 COLONIES/mL METHICILLIN RESISTANT STAPHYLOCOCCUS AUREUS  Culture, blood (Routine X 2) w Reflex to ID Panel     Status: None (Preliminary result)   Collection Time: 05/03/23  7:57 AM   Specimen: BLOOD LEFT HAND  Result Value Ref Range Status   Specimen Description BLOOD LEFT HAND  Final   Special Requests   Final    BOTTLES DRAWN AEROBIC AND ANAEROBIC Blood Culture adequate volume   Culture   Final    NO GROWTH 4 DAYS Performed at [facility], [address], [place], [place] [address]    Report Status PENDING  Incomplete  Culture, blood (Routine X 2) w Reflex to ID Panel     Status: None (Preliminary result)   Collection Time: 05/03/23  8:01 AM   Specimen: BLOOD LEFT ARM  Result Value Ref Range Status   Specimen Description BLOOD LEFT ARM  Final   Special Requests   Final    BOTTLES DRAWN AEROBIC AND ANAEROBIC Blood Culture adequate volume   Culture   Final    NO GROWTH 4 DAYS Performed at [facility], [address],  [place], [place] [address]    Report Status PENDING  Incomplete   Time coordinating discharge: 25 minutes  SIGNED: Lanae Boast, MD  [facility] 05/07/2023, 10:35 AM  If 7PM-7AM, please contact night-coverage [email or web address]

## 2023-05-07 NOTE — Progress Notes (Signed)
Mobility Specialist - Progress Note   05/07/23 0852  Mobility  Activity Ambulated with assistance in hallway  Level of Assistance Standby assist, set-up cues, supervision of patient - no hands on  Assistive Device Front wheel walker  Distance Ambulated (ft) 500 ft  Range of Motion/Exercises Active  Activity Response Tolerated well  Mobility Referral Yes  Mobility visit 1 Mobility  Mobility Specialist Start Time (ACUTE ONLY) 0840  Mobility Specialist Stop Time (ACUTE ONLY) [id number]  Mobility Specialist Time Calculation (min) (ACUTE ONLY) 12 min   Pt was found in bed and agreeable to ambulate. Stated feeling chest tightness during session. At EOS returned to recliner chair with all needs met. Call bell in reach.  Billey Chang Mobility Specialist

## 2023-05-07 NOTE — Progress Notes (Signed)
Patient discharged home, IV removed, discharge paperwork provided and explained, patient verbalized understanding.

## 2023-05-08 LAB — CULTURE, BLOOD (ROUTINE X 2)
Culture: NO GROWTH
Culture: NO GROWTH
Special Requests: ADEQUATE
Special Requests: ADEQUATE

## 2023-05-15 ENCOUNTER — Ambulatory Visit (INDEPENDENT_AMBULATORY_CARE_PROVIDER_SITE_OTHER): Payer: Medicare HMO

## 2023-05-15 ENCOUNTER — Encounter: Payer: Self-pay | Admitting: Hematology and Oncology

## 2023-05-15 DIAGNOSIS — H34811 Central retinal vein occlusion, right eye, with macular edema: Secondary | ICD-10-CM | POA: Diagnosis not present

## 2023-05-15 DIAGNOSIS — H35372 Puckering of macula, left eye: Secondary | ICD-10-CM | POA: Diagnosis not present

## 2023-05-15 DIAGNOSIS — H35352 Cystoid macular degeneration, left eye: Secondary | ICD-10-CM | POA: Diagnosis not present

## 2023-05-15 DIAGNOSIS — I495 Sick sinus syndrome: Secondary | ICD-10-CM

## 2023-05-15 DIAGNOSIS — H401234 Low-tension glaucoma, bilateral, indeterminate stage: Secondary | ICD-10-CM | POA: Diagnosis not present

## 2023-05-15 DIAGNOSIS — E1151 Type 2 diabetes mellitus with diabetic peripheral angiopathy without gangrene: Secondary | ICD-10-CM | POA: Diagnosis not present

## 2023-05-15 DIAGNOSIS — C9110 Chronic lymphocytic leukemia of B-cell type not having achieved remission: Secondary | ICD-10-CM | POA: Diagnosis not present

## 2023-05-15 DIAGNOSIS — Z961 Presence of intraocular lens: Secondary | ICD-10-CM | POA: Diagnosis not present

## 2023-05-16 ENCOUNTER — Ambulatory Visit: Payer: Medicare HMO

## 2023-05-16 VITALS — BP 112/64 | HR 63 | Temp 97.5°F | Resp 20 | Ht 67.0 in | Wt 157.0 lb

## 2023-05-16 DIAGNOSIS — D500 Iron deficiency anemia secondary to blood loss (chronic): Secondary | ICD-10-CM

## 2023-05-16 DIAGNOSIS — N3000 Acute cystitis without hematuria: Secondary | ICD-10-CM | POA: Diagnosis not present

## 2023-05-16 DIAGNOSIS — R338 Other retention of urine: Secondary | ICD-10-CM | POA: Diagnosis not present

## 2023-05-16 DIAGNOSIS — D509 Iron deficiency anemia, unspecified: Secondary | ICD-10-CM

## 2023-05-16 DIAGNOSIS — N401 Enlarged prostate with lower urinary tract symptoms: Secondary | ICD-10-CM | POA: Diagnosis not present

## 2023-05-16 MED ORDER — IRON SUCROSE 20 MG/ML IV SOLN
200.0000 mg | Freq: Once | INTRAVENOUS | Status: AC
  Administered 2023-05-16: 200 mg via INTRAVENOUS
  Filled 2023-05-16: qty 10

## 2023-05-16 MED ORDER — ACETAMINOPHEN 325 MG PO TABS
650.0000 mg | ORAL_TABLET | Freq: Once | ORAL | Status: AC
Start: 2023-05-16 — End: 2023-05-16
  Administered 2023-05-16: 650 mg via ORAL
  Filled 2023-05-16: qty 2

## 2023-05-16 MED ORDER — DIPHENHYDRAMINE HCL 25 MG PO CAPS
25.0000 mg | ORAL_CAPSULE | Freq: Once | ORAL | Status: AC
  Administered 2023-05-16: 25 mg via ORAL
  Filled 2023-05-16: qty 1

## 2023-05-16 NOTE — Progress Notes (Signed)
 Diagnosis: Iron Deficiency Anemia  Provider:  Chilton Greathouse MD  Procedure: IV Push  IV Type: Peripheral, IV Location: R Antecubital  Venofer (Iron Sucrose), Dose: 200 mg  Post Infusion IV Care: Observation period completed and Peripheral IV Discontinued  Discharge: Condition: Good, Destination: Home . AVS Provided  Performed by:  Rico Ala, LPN

## 2023-05-18 ENCOUNTER — Ambulatory Visit (INDEPENDENT_AMBULATORY_CARE_PROVIDER_SITE_OTHER): Payer: Medicare HMO

## 2023-05-18 VITALS — BP 132/63 | HR 63 | Temp 97.3°F | Resp 18 | Ht 67.0 in | Wt 157.8 lb

## 2023-05-18 DIAGNOSIS — D509 Iron deficiency anemia, unspecified: Secondary | ICD-10-CM

## 2023-05-18 DIAGNOSIS — D500 Iron deficiency anemia secondary to blood loss (chronic): Secondary | ICD-10-CM

## 2023-05-18 MED ORDER — DIPHENHYDRAMINE HCL 25 MG PO CAPS
25.0000 mg | ORAL_CAPSULE | Freq: Once | ORAL | Status: AC
Start: 2023-05-18 — End: 2023-05-18
  Administered 2023-05-18: 25 mg via ORAL
  Filled 2023-05-18: qty 1

## 2023-05-18 MED ORDER — IRON SUCROSE 20 MG/ML IV SOLN
200.0000 mg | Freq: Once | INTRAVENOUS | Status: AC
  Administered 2023-05-18: 200 mg via INTRAVENOUS
  Filled 2023-05-18: qty 10

## 2023-05-18 MED ORDER — ACETAMINOPHEN 325 MG PO TABS
650.0000 mg | ORAL_TABLET | Freq: Once | ORAL | Status: AC
  Administered 2023-05-18: 650 mg via ORAL
  Filled 2023-05-18: qty 2

## 2023-05-18 NOTE — Progress Notes (Signed)
 Diagnosis: Iron  Deficiency Anemia  Provider:  Praveen Mannam MD  Procedure: IV Push  IV Type: Peripheral, IV Location: L Forearm  Venofer  (Iron  Sucrose), Dose: 200 mg  Post Infusion IV Care: Observation period completed  Discharge: Condition: Good, Destination: Home . AVS Provided  Performed by:  Leita FORBES Miles, LPN

## 2023-05-19 ENCOUNTER — Encounter: Payer: Self-pay | Admitting: Cardiovascular Disease

## 2023-05-19 LAB — CUP PACEART REMOTE DEVICE CHECK
Battery Impedance: 1077 Ohm
Battery Remaining Longevity: 57 mo
Battery Voltage: 2.78 V
Brady Statistic AP VP Percent: 3 %
Brady Statistic AP VS Percent: 95 %
Brady Statistic AS VP Percent: 0 %
Brady Statistic AS VS Percent: 2 %
Date Time Interrogation Session: 20250207115603
Implantable Lead Connection Status: 753985
Implantable Lead Connection Status: 753985
Implantable Lead Implant Date: 20070706
Implantable Lead Implant Date: 20070706
Implantable Lead Location: 753859
Implantable Lead Location: 753860
Implantable Lead Model: 5076
Implantable Lead Model: 5092
Implantable Pulse Generator Implant Date: 20170113
Lead Channel Impedance Value: 401 Ohm
Lead Channel Impedance Value: 754 Ohm
Lead Channel Pacing Threshold Amplitude: 1 V
Lead Channel Pacing Threshold Amplitude: 1.25 V
Lead Channel Pacing Threshold Pulse Width: 0.4 ms
Lead Channel Pacing Threshold Pulse Width: 0.4 ms
Lead Channel Setting Pacing Amplitude: 2 V
Lead Channel Setting Pacing Amplitude: 2.5 V
Lead Channel Setting Pacing Pulse Width: 0.4 ms
Lead Channel Setting Sensing Sensitivity: 5.6 mV
Zone Setting Status: 755011
Zone Setting Status: 755011

## 2023-05-21 DIAGNOSIS — R058 Other specified cough: Secondary | ICD-10-CM | POA: Diagnosis not present

## 2023-05-21 DIAGNOSIS — C9110 Chronic lymphocytic leukemia of B-cell type not having achieved remission: Secondary | ICD-10-CM | POA: Diagnosis not present

## 2023-05-21 DIAGNOSIS — E1121 Type 2 diabetes mellitus with diabetic nephropathy: Secondary | ICD-10-CM | POA: Diagnosis not present

## 2023-05-21 DIAGNOSIS — D509 Iron deficiency anemia, unspecified: Secondary | ICD-10-CM | POA: Diagnosis not present

## 2023-05-21 DIAGNOSIS — J069 Acute upper respiratory infection, unspecified: Secondary | ICD-10-CM | POA: Diagnosis not present

## 2023-05-21 DIAGNOSIS — Z8614 Personal history of Methicillin resistant Staphylococcus aureus infection: Secondary | ICD-10-CM | POA: Diagnosis not present

## 2023-05-21 DIAGNOSIS — N390 Urinary tract infection, site not specified: Secondary | ICD-10-CM | POA: Diagnosis not present

## 2023-05-23 ENCOUNTER — Ambulatory Visit: Payer: Medicare HMO

## 2023-05-25 ENCOUNTER — Ambulatory Visit: Payer: Medicare HMO

## 2023-05-29 ENCOUNTER — Ambulatory Visit: Payer: Medicare HMO

## 2023-05-29 VITALS — BP 118/64 | HR 64 | Temp 97.5°F | Resp 18 | Ht 67.0 in | Wt 148.6 lb

## 2023-05-29 DIAGNOSIS — D509 Iron deficiency anemia, unspecified: Secondary | ICD-10-CM

## 2023-05-29 DIAGNOSIS — D500 Iron deficiency anemia secondary to blood loss (chronic): Secondary | ICD-10-CM

## 2023-05-29 MED ORDER — IRON SUCROSE 20 MG/ML IV SOLN
200.0000 mg | Freq: Once | INTRAVENOUS | Status: AC
  Administered 2023-05-29: 200 mg via INTRAVENOUS
  Filled 2023-05-29: qty 10

## 2023-05-29 MED ORDER — ACETAMINOPHEN 325 MG PO TABS
650.0000 mg | ORAL_TABLET | Freq: Once | ORAL | Status: AC
  Administered 2023-05-29: 650 mg via ORAL
  Filled 2023-05-29: qty 2

## 2023-05-29 MED ORDER — DIPHENHYDRAMINE HCL 25 MG PO CAPS
25.0000 mg | ORAL_CAPSULE | Freq: Once | ORAL | Status: AC
  Administered 2023-05-29: 25 mg via ORAL
  Filled 2023-05-29: qty 1

## 2023-05-29 NOTE — Progress Notes (Signed)
 Diagnosis: Iron Deficiency Anemia  Provider:  Chilton Greathouse MD  Procedure: IV Push  IV Type: Peripheral, IV Location: R Antecubital  Venofer (Iron Sucrose), Dose: 200 mg  Post Infusion IV Care: Observation period completed and Peripheral IV Discontinued  Discharge: Condition: Stable, Destination: Home . AVS Declined  Performed by:  Wyvonne Lenz, RN

## 2023-05-31 ENCOUNTER — Ambulatory Visit: Payer: Medicare HMO

## 2023-05-31 VITALS — BP 124/64 | HR 59 | Temp 97.7°F | Resp 20 | Ht 67.0 in | Wt 151.2 lb

## 2023-05-31 DIAGNOSIS — D500 Iron deficiency anemia secondary to blood loss (chronic): Secondary | ICD-10-CM

## 2023-05-31 DIAGNOSIS — D509 Iron deficiency anemia, unspecified: Secondary | ICD-10-CM | POA: Diagnosis not present

## 2023-05-31 MED ORDER — DIPHENHYDRAMINE HCL 25 MG PO CAPS: 25.0000 mg | ORAL_CAPSULE | Freq: Once | ORAL | Status: DC

## 2023-05-31 MED ORDER — IRON SUCROSE 20 MG/ML IV SOLN
200.0000 mg | Freq: Once | INTRAVENOUS | Status: AC
  Administered 2023-05-31: 200 mg via INTRAVENOUS
  Filled 2023-05-31: qty 10

## 2023-05-31 MED ORDER — ACETAMINOPHEN 325 MG PO TABS
650.0000 mg | ORAL_TABLET | Freq: Once | ORAL | Status: AC
  Administered 2023-05-31: 650 mg via ORAL
  Filled 2023-05-31: qty 2

## 2023-05-31 NOTE — Progress Notes (Signed)
Diagnosis: Iron Deficiency Anemia  Provider:  Chilton Greathouse MD  Procedure: IV Push  IV Type: Peripheral, IV Location: R Antecubital  Venofer (Iron Sucrose), Dose: 200 mg  Post Infusion IV Care: Observation period completed and Peripheral IV Discontinued  Discharge: Condition: Good, Destination: Home . AVS Provided  Performed by:  Rico Ala, LPN   Patient refused benadryl 25 mg. Nurse educated patient and stressed the importance of taking pre-medications as a precaution in the event of a medication reaction. Patient verbalized understanding.

## 2023-06-05 DIAGNOSIS — R338 Other retention of urine: Secondary | ICD-10-CM | POA: Diagnosis not present

## 2023-06-06 ENCOUNTER — Ambulatory Visit (INDEPENDENT_AMBULATORY_CARE_PROVIDER_SITE_OTHER): Payer: Medicare HMO

## 2023-06-06 VITALS — BP 139/69 | HR 60 | Temp 97.8°F | Resp 20 | Ht 67.0 in | Wt 151.0 lb

## 2023-06-06 DIAGNOSIS — D509 Iron deficiency anemia, unspecified: Secondary | ICD-10-CM | POA: Diagnosis not present

## 2023-06-06 DIAGNOSIS — D500 Iron deficiency anemia secondary to blood loss (chronic): Secondary | ICD-10-CM

## 2023-06-06 MED ORDER — DIPHENHYDRAMINE HCL 25 MG PO CAPS
25.0000 mg | ORAL_CAPSULE | Freq: Once | ORAL | Status: AC
  Administered 2023-06-06: 25 mg via ORAL
  Filled 2023-06-06: qty 1

## 2023-06-06 MED ORDER — ACETAMINOPHEN 325 MG PO TABS
650.0000 mg | ORAL_TABLET | Freq: Once | ORAL | Status: AC
  Administered 2023-06-06: 650 mg via ORAL
  Filled 2023-06-06: qty 2

## 2023-06-06 MED ORDER — IRON SUCROSE 20 MG/ML IV SOLN
200.0000 mg | Freq: Once | INTRAVENOUS | Status: AC
Start: 2023-06-06 — End: 2023-06-06
  Administered 2023-06-06: 200 mg via INTRAVENOUS
  Filled 2023-06-06: qty 10

## 2023-06-06 NOTE — Progress Notes (Signed)
 Diagnosis: Iron Deficiency Anemia  Provider:  Chilton Greathouse MD  Procedure: IV Push  IV Type: Peripheral, IV Location: R Antecubital  Venofer (Iron Sucrose), Dose: 200 mg  Post Infusion IV Care: Observation period completed and Peripheral IV Discontinued  Discharge: Condition: Good, Destination: Home . AVS Declined  Performed by:  Rico Ala, LPN

## 2023-06-19 DIAGNOSIS — Z978 Presence of other specified devices: Secondary | ICD-10-CM | POA: Diagnosis not present

## 2023-06-19 DIAGNOSIS — N401 Enlarged prostate with lower urinary tract symptoms: Secondary | ICD-10-CM | POA: Diagnosis not present

## 2023-06-19 DIAGNOSIS — D509 Iron deficiency anemia, unspecified: Secondary | ICD-10-CM | POA: Diagnosis not present

## 2023-06-19 DIAGNOSIS — E1121 Type 2 diabetes mellitus with diabetic nephropathy: Secondary | ICD-10-CM | POA: Diagnosis not present

## 2023-06-19 DIAGNOSIS — C9110 Chronic lymphocytic leukemia of B-cell type not having achieved remission: Secondary | ICD-10-CM | POA: Diagnosis not present

## 2023-06-19 DIAGNOSIS — Z9181 History of falling: Secondary | ICD-10-CM | POA: Diagnosis not present

## 2023-06-21 NOTE — Addendum Note (Signed)
 Addended by: Geralyn Flash D on: 06/21/2023 03:16 PM   Modules accepted: Orders

## 2023-06-21 NOTE — Progress Notes (Signed)
 Remote pacemaker transmission.

## 2023-06-26 DIAGNOSIS — R338 Other retention of urine: Secondary | ICD-10-CM | POA: Diagnosis not present

## 2023-07-02 NOTE — Therapy (Unsigned)
 OUTPATIENT PHYSICAL THERAPY LOWER EXTREMITY AND BALANCE EVALUATION   Patient Name: Robert Burns MRN: [id number] DOB:07-12-1940, 83 y.o., male Today's Date: 07/03/2023  END OF SESSION:  PT End of Session - 07/03/23 1152     Visit Number 1    Date for PT Re-Evaluation 08/28/23    Authorization Type [employer] MCR    Progress Note Due on Visit 10    PT Start Time 1152    PT Stop Time 1235    PT Time Calculation (min) 43 min    Activity Tolerance Patient tolerated treatment well    Behavior During Therapy WFL for tasks assessed/performed             Past Medical History:  Diagnosis Date   Anemia    Anxiety    Arthritis    "all over" (11/25/2015)   CAD (coronary artery disease)    a. s/p CABG  06/2002; b. 02/01/15 PCI: DES to prox SVG to PDA, staged PCI of SVG to Diag in 02/2015; c. 04/2017 Cath/PCI: LM nl, LAD 100ost, 81m, 75d, LCX 60ost, OM2 80, RCA 100ost, RPDA 80, LIMA->LAD ok, VG->D1 patent stent, VG->RPDA patent stent, 50p, VG->OM1->OM2 90p (3.0x24 Synergy DES), 100 between OM1->OM2 (med rx).   Cancer [facility])    Right Shoulder, Left Leg- BCC, SCC, AND MELANOMA   Colon polyp    Elevated cholesterol    Epithelioid hemangioendothelioma    s/p resection of left SFA/mass with interposition 6 mm GoreTex graft 06/02/10, s/p repeat resection for positive margins 09/22/10   High blood pressure    Hyperlipidemia    Hypertension    Leg pain    OSA on CPAP    PAD (peripheral artery disease) (HCC)    a. 10/2002 L SFA PTA/BMS; b. 8/17 LE Angio: LEIA 90 (9x40 self exp stent), LSFA short segment prox occlusion (staged PTA/stenting 01/03/2016), patent mid stent, RSFA 39m (staged PTA/DEB 02/14/2016).   Presence of permanent cardiac pacemaker    Medtronic   Renal insufficiency 12/18/2022   SSS (sick sinus syndrome) (HCC)    a. s/p PPM in 2007 with gen change 04/2015 - Medtronic Adapta ADDRL1, ser # [id number].   Type II diabetes mellitus (HCC)    Type II   Urgency of urination    Past  Surgical History:  Procedure Laterality Date   CARDIAC CATHETERIZATION N/A 02/01/2015   Procedure: Left Heart Cath and Coronary Angiography;  Surgeon: Runell Gess, MD;  Location: [facility] INVASIVE CV LAB;  Service: Cardiovascular;  Laterality: N/A;   CARDIAC CATHETERIZATION N/A 02/01/2015   Procedure: Coronary Stent Intervention;  Surgeon: Runell Gess, MD;  Location: [facility] INVASIVE CV LAB;  Service: Cardiovascular;  Laterality: N/A;   CARDIAC CATHETERIZATION  06/2002   "just before bypass OR"   CARDIAC CATHETERIZATION N/A 03/01/2015   Procedure: Coronary Stent Intervention;  Surgeon: Runell Gess, MD;  Location: [facility] INVASIVE CV LAB;  Service: Cardiovascular;  Laterality: N/A;   CARDIAC CATHETERIZATION  02/06/2018   COLONOSCOPY     CORONARY ANGIOPLASTY     CORONARY ARTERY BYPASS GRAFT  06/2002   x5, LIMA-LAD;VG- Diag; seq VG- ramus & OM branch; VG-PDA   CORONARY STENT INTERVENTION N/A 04/12/2017   Procedure: CORONARY STENT INTERVENTION;  Surgeon: Runell Gess, MD;  Location: [facility] INVASIVE CV LAB;  Service: Cardiovascular;  Laterality: N/A;   CORONARY STENT INTERVENTION N/A 02/08/2018   Procedure: CORONARY STENT INTERVENTION;  Surgeon: Corky Crafts, MD;  Location: [facility] INVASIVE CV LAB;  Service: Cardiovascular;  Laterality: N/A;   CORONARY STENT INTERVENTION N/A 11/12/2018   Procedure: CORONARY STENT INTERVENTION;  Surgeon: Iran Ouch, MD;  Location: [facility] INVASIVE CV LAB;  Service: Cardiovascular;  Laterality: N/A;   CORONARY STENT INTERVENTION N/A 06/22/2020   Procedure: CORONARY STENT INTERVENTION;  Surgeon: Corky Crafts, MD;  Location: [facility] INVASIVE CV LAB;  Service: Cardiovascular;  Laterality: N/A;   EP IMPLANTABLE DEVICE N/A 04/23/2015   Procedure: PPM Generator Changeout;  Surgeon: Thurmon Fair, MD;  Location: [facility] INVASIVE CV LAB;  Service: Cardiovascular;  Laterality: N/A;   FALSE ANEURYSM REPAIR Left 11/29/2018   Procedure: REPAIR FALSE ANEURYSM LEFT RADIAL ARTERY;   Surgeon: Larina Earthly, MD;  Location: [facility] OR;  Service: Vascular;  Laterality: Left;   FEMORAL ARTERY STENT Left ~ 2014   "taken out of my leg; couldn' catorgorize what kind so the put it under all 3"; cataroziepitheloid hemanioendotheliomau   FOOT FRACTURE SURGERY Left 1973   FRACTURE SURGERY     INSERT / REPLACE / REMOVE PACEMAKER  10/13/05   right side, medtronic Adapta   IR 3D INDEPENDENT WKST  02/26/2023   IR ANGIOGRAM PELVIS SELECTIVE OR SUPRASELECTIVE  02/26/2023   IR ANGIOGRAM SELECTIVE EACH ADDITIONAL VESSEL  02/26/2023   IR ANGIOGRAM SELECTIVE EACH ADDITIONAL VESSEL  02/26/2023   IR EMBO TUMOR ORGAN ISCHEMIA INFARCT INC GUIDE ROADMAPPING  02/26/2023   IR RADIOLOGIST EVAL & MGMT  02/02/2023   IR US GUIDE VASC ACCESS LEFT  02/26/2023   IR US GUIDE VASC ACCESS LEFT  02/26/2023   IR US GUIDE VASC ACCESS LEFT  02/26/2023   KNEE HARDWARE REMOVAL Right 1950's   "3-4 months after the insertion"   KNEE SURGERY Right 1950's   "broke my lower leg; had to put pin in my knee to keep lower leg in place til it healed"   LEFT HEART CATH AND CORS/GRAFTS ANGIOGRAPHY N/A 04/12/2017   Procedure: LEFT HEART CATH AND CORS/GRAFTS ANGIOGRAPHY;  Surgeon: Runell Gess, MD;  Location: [facility] INVASIVE CV LAB;  Service: Cardiovascular;  Laterality: N/A;   LEFT HEART CATH AND CORS/GRAFTS ANGIOGRAPHY N/A 02/06/2018   Procedure: LEFT HEART CATH AND CORS/GRAFTS ANGIOGRAPHY;  Surgeon: Lennette Bihari, MD;  Location: [facility] INVASIVE CV LAB;  Service: Cardiovascular;  Laterality: N/A;   LEFT HEART CATH AND CORS/GRAFTS ANGIOGRAPHY N/A 11/12/2018   Procedure: LEFT HEART CATH AND CORS/GRAFTS ANGIOGRAPHY;  Surgeon: Iran Ouch, MD;  Location: [facility] INVASIVE CV LAB;  Service: Cardiovascular;  Laterality: N/A;   LEFT HEART CATH AND CORS/GRAFTS ANGIOGRAPHY N/A 06/22/2020   Procedure: LEFT HEART CATH AND CORS/GRAFTS ANGIOGRAPHY;  Surgeon: Corky Crafts, MD;  Location: [facility] INVASIVE CV LAB;  Service: Cardiovascular;   Laterality: N/A;   LEFT HEART CATH AND CORS/GRAFTS ANGIOGRAPHY N/A 12/01/2022   Procedure: LEFT HEART CATH AND CORS/GRAFTS ANGIOGRAPHY;  Surgeon: [place]az[place], Peter M, MD;  Location: [facility] INVASIVE CV LAB;  Service: Cardiovascular;  Laterality: N/A;   PERIPHERAL VASCULAR CATHETERIZATION N/A 11/25/2015   Procedure: Lower Extremity Angiography;  Surgeon: Runell Gess, MD;  Location: [facility] INVASIVE CV LAB;  Service: Cardiovascular;  Laterality: N/A;   PERIPHERAL VASCULAR CATHETERIZATION Left 11/25/2015   Procedure: Peripheral Vascular Intervention;  Surgeon: Runell Gess, MD;  Location: [facility] INVASIVE CV LAB;  Service: Cardiovascular;  Laterality: Left;  external iliac   PERIPHERAL VASCULAR CATHETERIZATION N/A 01/03/2016   Procedure: Lower Extremity Angiography;  Surgeon: Runell Gess, MD;  Location: [facility] INVASIVE CV LAB;  Service: Cardiovascular;  Laterality: N/A;  PERIPHERAL VASCULAR CATHETERIZATION Left 01/03/2016   Procedure: Peripheral Vascular Intervention;  Surgeon: Runell Gess, MD;  Location: [facility] INVASIVE CV LAB;  Service: Cardiovascular;  Laterality: Left;  SFA   PERIPHERAL VASCULAR CATHETERIZATION Right 02/14/2016   Procedure: Peripheral Vascular Atherectomy;  Surgeon: Runell Gess, MD;  Location: [facility] INVASIVE CV LAB;  Service: Cardiovascular;  Laterality: Right;  SFA   POPLITEAL ARTERY STENT  01/03/2016   Contralateral access with a 7 French crossover sheath (second order catheter placement)   TONSILLECTOMY AND ADENOIDECTOMY     TUMOR EXCISION Right ~ 2005   cancerous tumor removed from shoulder   TUMOR EXCISION Right ~ 2000   benign tumor removed from under shoulder   TUMOR EXCISION Left 06/02/2010   resection of Lt SFA wth interposition of Gore-Tex graft   Patient Active Problem List   Diagnosis Date Noted   Complicated UTI (urinary tract infection) 05/03/2023   Hx of CABG 12/20/2022   Urinary tract infection with hematuria 12/18/2022   Sepsis (HCC) 12/18/2022   Iron deficiency  12/18/2022   Renal insufficiency 12/18/2022   NSTEMI (non-ST elevated myocardial infarction) (HCC) 11/30/2022   Arthritis pain 11/30/2022   BPH (benign prostatic hyperplasia) 11/30/2022   Iron deficiency anemia due to chronic blood loss 11/15/2022   Acute encephalopathy 02/06/2022   Elevated troponin    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   Hematoma of right hip 01/10/2021   Primary osteoarthritis of right elbow 01/10/2021   Chronic anticoagulation 08/12/2019   Dyslipidemia (high LDL; low HDL) 03/05/2019   Unstable angina (HCC) 11/11/2018   Hypercholesterolemia 02/20/2018   PAF (paroxysmal atrial fibrillation) (HCC) 02/20/2018   CAD (coronary artery disease) of bypass graft 02/06/2018   CAD, multiple vessel 02/06/2018   Leukocytosis 02/16/2016   Transient hypotension 02/15/2016   Peripheral arterial disease (HCC)    Pacemaker 04/23/2015   Positive cardiac stress test    Chest pain    OSA on CPAP 11/04/2012   SSS (sick sinus syndrome), medtronic adapta    Hyperlipidemia due to type 2 diabetes mellitus (HCC)    Superficial femoral artery injury 05/09/2011   SPRAIN&STRAIN OTHER SPECIFIED SITES KNEE&LEG 01/31/2010   Type 2 diabetes mellitus with complication, without long-term current use of insulin (HCC) 11/26/2006   Essential hypertension 11/26/2006    PCP: Thana Ates, MD   REFERRING PROVIDER: Thana Ates, MD   REFERRING DIAG: Z91.81 History of falls  THERAPY DIAG:  Unsteadiness on feet  Muscle weakness (generalized)  Rationale for Evaluation and Treatment: Rehabilitation  ONSET DATE: October   SUBJECTIVE:   SUBJECTIVE STATEMENT: Since October had a MI and have been in the hospital a lot (most recent 05/02/23) I've lost 10-15 pounds. He has had 2 infusions of iron. I've been fatigued a lot. My DM is kicking up. I'm wearing a monitor, but don't quite understand it. It climbs in a heartbeat to 300 and then in the morning it's down to 130. I eat  oatmeal and it shoots back up to 300. (Dr. Margaretann Loveless). He does not use the cane at home. Only when out. I'm losing the feeling in my feet. Gabapentin doesn't help. Wears a lift in the R shoe due to leg length discrepancy. Some left knee pain and wears brace. 4 4   PERTINENT HISTORY: CLL (leukemia), PAD, DM, CAD, PACEMAKER, on a catheter x 5 months PAIN:  Are you having pain? No - later reports he wears a brace for L knee pain.  PRECAUTIONS: Fall  and ICD/Pacemaker  RED FLAGS: None   WEIGHT BEARING RESTRICTIONS: No  FALLS:  Has patient fallen in last 6 months? Yes. Number of falls 6 tripping  LIVING ENVIRONMENT: Lives with: lives with their spouse Lives in: House/apartment Stairs: Yes: Internal: 13 steps; on left going up and External: 1 steps; no rail Has following equipment at home: Single point cane, Walker - 2 wheeled, and Environmental consultant - 4 wheeled  OCCUPATION: retired  PLOF: Independent and daughter does most of the driving, but he does drive  PATIENT GOALS: to feel less clumsy  NEXT MD VISIT: a few months  OBJECTIVE:  Note: Objective measures were completed at Evaluation unless otherwise noted.  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   MUSCLE LENGTH: Tight B gastrocs  POSTURE: rounded shoulders, forward head, and posterior pelvic tilt   LOWER EXTREMITY ROM: WFL for tasks assessed  Active ROM Right eval Left eval  Hip flexion    Hip extension    Hip abduction    Hip adduction    Hip internal rotation    Hip external rotation    Knee flexion    Knee extension    Ankle dorsiflexion    Ankle plantarflexion    Ankle inversion    Ankle eversion     (Blank rows = not tested)  LOWER EXTREMITY MMT: ABD/ADD in sitting  MMT Right eval Left eval  Hip flexion 4- 4  Hip extension    Hip  abduction 5 5  Hip adduction 5 5  Hip internal rotation    Hip external rotation    Knee flexion    Knee extension 5 5  Ankle dorsiflexion 5 5  Ankle plantarflexion    Ankle inversion    Ankle eversion     (Blank rows = not tested)   FUNCTIONAL TESTS:  5 times sit to stand: 15.12 sec (one LOB and sat back down) Timed up and go (TUG): 10.21 sec Berg Balance Scale: 44/56 37-45 significant fall risk (>80%)  GAIT: Distance walked: 20 Assistive device utilized: Single point cane Level of assistance: Modified independence Comments: Wide BOS, B hip ER                                                                                                                                TREATMENT DATE:   07/03/23 See pt ed and HEP   PATIENT EDUCATION:  Education details: PT eval findings, anticipated POC, initial HEP, and gait safety with SPC in home and community  Person educated: Patient Education method: Explanation, Demonstration, and Handouts Education comprehension: verbalized understanding and returned demonstration  HOME EXERCISE PROGRAM: Access Code: [email or web address] URL: [email or web address] Date: 07/03/2023 Prepared by: Raynelle Fanning  Exercises - Sit to Stand  - 3 x daily - 7 x  weekly - 1-2 sets - 10 reps - Supine Bridge  - 2 x daily - 7 x weekly - 1-3 sets - 10 reps  ASSESSMENT:  CLINICAL IMPRESSION: Patient is a 83 y.o. male who was seen today for physical therapy evaluation and treatment for unsteadiness and a h/o falls. He scored 44/56 on the BERG indicating he is a significant fall risk (>80%) and reports 6 falls over the past 6 months. He demonstrates LE functional weakness R>L and his significantly challenged with SLS activities. He also reports decreased sensation in B feet. He uses his cane in the community, but not at home. PT advised pt he should be using an AD at all times. He also reports he walks backwards down his basement steps using a hand rail. He will  benefit from skilled PT to address these deficits.    OBJECTIVE IMPAIRMENTS: decreased balance, decreased strength, decreased safety awareness, impaired flexibility, impaired sensation, postural dysfunction, and pain.   ACTIVITY LIMITATIONS: stairs and locomotion level  PARTICIPATION LIMITATIONS: meal prep, cleaning, laundry, community activity, and yard work  PERSONAL FACTORS: Age, Past/current experiences, Time since onset of injury/illness/exacerbation, and 3+ comorbidities: leukemia, iron deficiency, DM, L knee pain  are also affecting patient's functional outcome.   REHAB POTENTIAL: Good  CLINICAL DECISION MAKING: Unstable/unpredictable  EVALUATION COMPLEXITY: High   GOALS: Goals reviewed with patient? Yes  SHORT TERM GOALS: Target date: 07/31/2023   Patient will be independent with initial HEP. Baseline:  Goal status: INITIAL  2.  Patient will demonstrate decreased risk for falls by scoring 48/56 on the BERG. Baseline: 44 Goal status: INITIAL  3.  Patient will be educated on strategies to decrease risk of falls.  Baseline:  Goal status: INITIAL   LONG TERM GOALS: Target date: 08/28/2023  Patient will be independent with advanced/ongoing HEP to improve outcomes and carryover.  Baseline:  Goal status: INITIAL  2.  Patient will score 52 on Berg Balance test to demonstrate lower risk of falls. (MCID= 8 points) .  Baseline: 44 Goal status: INITIAL  3.  Patient will be able to step up/down curb safely with LRAD for safety with community ambulation.  Baseline:  Goal status: INITIAL   4.  Patient will demonstrate decreased 5XSTS by 2-3 seconds and no LOB showing improved functional strength. Baseline: 15.12 sec Goal status: INITIAL  5.  Patient able to climb stairs safely with one rail support.  Baseline: descends stairs backwards Goal status: INITIAL  6.  Patient will report no falls during therapy episode. Baseline:  Goal status: INITIAL   PLAN:  PT  FREQUENCY: 2x/week  PT DURATION: 8 weeks  PLANNED INTERVENTIONS: 97164- PT Re-evaluation, 97110-Therapeutic exercises, 97530- Therapeutic activity, 97112- Neuromuscular re-education, 97535- Self Care, 16109- Manual therapy, [phone]- Gait training, Patient/Family education, Balance training, Stair training, and DME instructions  PLAN FOR NEXT SESSION: gluteus med focus with general LE strength, Balance, stairs, check MCTSIB, encourage AD at all times right now. Be aware of blood sugar levels as pt says they have been inconsistent.    Solon Palm, PT  07/03/2023, 2:29 PM   Referring diagnosis? Z91.81 Treatment diagnosis? (if different than referring diagnosis) R26.81, M62.81 What was this (referring dx) caused by? []  Surgery [x]  Fall []  Ongoing issue []  Arthritis []  Other: ____________  Laterality: []  Rt []  Lt [x]  Both  Check all possible CPT codes:  *CHOOSE 10 OR LESS*    See Planned Interventions listed in the Plan section of the Evaluation.

## 2023-07-03 ENCOUNTER — Other Ambulatory Visit: Payer: Self-pay

## 2023-07-03 ENCOUNTER — Encounter: Payer: Self-pay | Admitting: Physical Therapy

## 2023-07-03 ENCOUNTER — Ambulatory Visit: Attending: Internal Medicine | Admitting: Physical Therapy

## 2023-07-03 DIAGNOSIS — M6281 Muscle weakness (generalized): Secondary | ICD-10-CM | POA: Insufficient documentation

## 2023-07-03 DIAGNOSIS — R2681 Unsteadiness on feet: Secondary | ICD-10-CM | POA: Diagnosis not present

## 2023-07-03 DIAGNOSIS — R2689 Other abnormalities of gait and mobility: Secondary | ICD-10-CM | POA: Insufficient documentation

## 2023-07-05 ENCOUNTER — Ambulatory Visit

## 2023-07-05 DIAGNOSIS — M6281 Muscle weakness (generalized): Secondary | ICD-10-CM | POA: Diagnosis not present

## 2023-07-05 DIAGNOSIS — R2689 Other abnormalities of gait and mobility: Secondary | ICD-10-CM

## 2023-07-05 DIAGNOSIS — R2681 Unsteadiness on feet: Secondary | ICD-10-CM

## 2023-07-05 NOTE — Therapy (Signed)
 OUTPATIENT PHYSICAL THERAPY LOWER EXTREMITY AND BALANCE TREATMENT   Patient Name: Robert Burns MRN: [id number] DOB:12-29-1940, 83 y.o., male Today's Date: 07/05/2023  END OF SESSION:  PT End of Session - 07/05/23 1313     Visit Number 2    Date for PT Re-Evaluation 08/28/23    Authorization Type [employer] MCR    Progress Note Due on Visit 10    PT Start Time 1315    PT Stop Time 1405    PT Time Calculation (min) 50 min    Activity Tolerance Patient tolerated treatment well    Behavior During Therapy WFL for tasks assessed/performed            Past Medical History:  Diagnosis Date   Anemia    Anxiety    Arthritis    "all over" (11/25/2015)   CAD (coronary artery disease)    a. s/p CABG  06/2002; b. 02/01/15 PCI: DES to prox SVG to PDA, staged PCI of SVG to Diag in 02/2015; c. 04/2017 Cath/PCI: LM nl, LAD 100ost, 68m, 75d, LCX 60ost, OM2 80, RCA 100ost, RPDA 80, LIMA->LAD ok, VG->D1 patent stent, VG->RPDA patent stent, 50p, VG->OM1->OM2 90p (3.0x24 Synergy DES), 100 between OM1->OM2 (med rx).   Cancer [facility])    Right Shoulder, Left Leg- BCC, SCC, AND MELANOMA   Colon polyp    Elevated cholesterol    Epithelioid hemangioendothelioma    s/p resection of left SFA/mass with interposition 6 mm GoreTex graft 06/02/10, s/p repeat resection for positive margins 09/22/10   High blood pressure    Hyperlipidemia    Hypertension    Leg pain    OSA on CPAP    PAD (peripheral artery disease) (HCC)    a. 10/2002 L SFA PTA/BMS; b. 8/17 LE Angio: LEIA 90 (9x40 self exp stent), LSFA short segment prox occlusion (staged PTA/stenting 01/03/2016), patent mid stent, RSFA 32m (staged PTA/DEB 02/14/2016).   Presence of permanent cardiac pacemaker    Medtronic   Renal insufficiency 12/18/2022   SSS (sick sinus syndrome) (HCC)    a. s/p PPM in 2007 with gen change 04/2015 - Medtronic Adapta ADDRL1, ser # [id number].   Type II diabetes mellitus (HCC)    Type II   Urgency of urination    Past Surgical  History:  Procedure Laterality Date   CARDIAC CATHETERIZATION N/A 02/01/2015   Procedure: Left Heart Cath and Coronary Angiography;  Surgeon: Runell Gess, MD;  Location: [facility] INVASIVE CV LAB;  Service: Cardiovascular;  Laterality: N/A;   CARDIAC CATHETERIZATION N/A 02/01/2015   Procedure: Coronary Stent Intervention;  Surgeon: Runell Gess, MD;  Location: [facility] INVASIVE CV LAB;  Service: Cardiovascular;  Laterality: N/A;   CARDIAC CATHETERIZATION  06/2002   "just before bypass OR"   CARDIAC CATHETERIZATION N/A 03/01/2015   Procedure: Coronary Stent Intervention;  Surgeon: Runell Gess, MD;  Location: [facility] INVASIVE CV LAB;  Service: Cardiovascular;  Laterality: N/A;   CARDIAC CATHETERIZATION  02/06/2018   COLONOSCOPY     CORONARY ANGIOPLASTY     CORONARY ARTERY BYPASS GRAFT  06/2002   x5, LIMA-LAD;VG- Diag; seq VG- ramus & OM branch; VG-PDA   CORONARY STENT INTERVENTION N/A 04/12/2017   Procedure: CORONARY STENT INTERVENTION;  Surgeon: Runell Gess, MD;  Location: [facility] INVASIVE CV LAB;  Service: Cardiovascular;  Laterality: N/A;   CORONARY STENT INTERVENTION N/A 02/08/2018   Procedure: CORONARY STENT INTERVENTION;  Surgeon: Corky Crafts, MD;  Location: [facility] INVASIVE CV LAB;  Service: Cardiovascular;  Laterality:  N/A;   CORONARY STENT INTERVENTION N/A 11/12/2018   Procedure: CORONARY STENT INTERVENTION;  Surgeon: Iran Ouch, MD;  Location: [facility] INVASIVE CV LAB;  Service: Cardiovascular;  Laterality: N/A;   CORONARY STENT INTERVENTION N/A 06/22/2020   Procedure: CORONARY STENT INTERVENTION;  Surgeon: Corky Crafts, MD;  Location: [facility] INVASIVE CV LAB;  Service: Cardiovascular;  Laterality: N/A;   EP IMPLANTABLE DEVICE N/A 04/23/2015   Procedure: PPM Generator Changeout;  Surgeon: Thurmon Fair, MD;  Location: [facility] INVASIVE CV LAB;  Service: Cardiovascular;  Laterality: N/A;   FALSE ANEURYSM REPAIR Left 11/29/2018   Procedure: REPAIR FALSE ANEURYSM LEFT RADIAL ARTERY;  Surgeon:  Larina Earthly, MD;  Location: [facility] OR;  Service: Vascular;  Laterality: Left;   FEMORAL ARTERY STENT Left ~ 2014   "taken out of my leg; couldn' catorgorize what kind so the put it under all 3"; cataroziepitheloid hemanioendotheliomau   FOOT FRACTURE SURGERY Left 1973   FRACTURE SURGERY     INSERT / REPLACE / REMOVE PACEMAKER  10/13/05   right side, medtronic Adapta   IR 3D INDEPENDENT WKST  02/26/2023   IR ANGIOGRAM PELVIS SELECTIVE OR SUPRASELECTIVE  02/26/2023   IR ANGIOGRAM SELECTIVE EACH ADDITIONAL VESSEL  02/26/2023   IR ANGIOGRAM SELECTIVE EACH ADDITIONAL VESSEL  02/26/2023   IR EMBO TUMOR ORGAN ISCHEMIA INFARCT INC GUIDE ROADMAPPING  02/26/2023   IR RADIOLOGIST EVAL & MGMT  02/02/2023   IR US GUIDE VASC ACCESS LEFT  02/26/2023   IR US GUIDE VASC ACCESS LEFT  02/26/2023   IR US GUIDE VASC ACCESS LEFT  02/26/2023   KNEE HARDWARE REMOVAL Right 1950's   "3-4 months after the insertion"   KNEE SURGERY Right 1950's   "broke my lower leg; had to put pin in my knee to keep lower leg in place til it healed"   LEFT HEART CATH AND CORS/GRAFTS ANGIOGRAPHY N/A 04/12/2017   Procedure: LEFT HEART CATH AND CORS/GRAFTS ANGIOGRAPHY;  Surgeon: Runell Gess, MD;  Location: [facility] INVASIVE CV LAB;  Service: Cardiovascular;  Laterality: N/A;   LEFT HEART CATH AND CORS/GRAFTS ANGIOGRAPHY N/A 02/06/2018   Procedure: LEFT HEART CATH AND CORS/GRAFTS ANGIOGRAPHY;  Surgeon: Lennette Bihari, MD;  Location: [facility] INVASIVE CV LAB;  Service: Cardiovascular;  Laterality: N/A;   LEFT HEART CATH AND CORS/GRAFTS ANGIOGRAPHY N/A 11/12/2018   Procedure: LEFT HEART CATH AND CORS/GRAFTS ANGIOGRAPHY;  Surgeon: Iran Ouch, MD;  Location: [facility] INVASIVE CV LAB;  Service: Cardiovascular;  Laterality: N/A;   LEFT HEART CATH AND CORS/GRAFTS ANGIOGRAPHY N/A 06/22/2020   Procedure: LEFT HEART CATH AND CORS/GRAFTS ANGIOGRAPHY;  Surgeon: Corky Crafts, MD;  Location: [facility] INVASIVE CV LAB;  Service: Cardiovascular;  Laterality: N/A;    LEFT HEART CATH AND CORS/GRAFTS ANGIOGRAPHY N/A 12/01/2022   Procedure: LEFT HEART CATH AND CORS/GRAFTS ANGIOGRAPHY;  Surgeon: [place]az[place], Peter M, MD;  Location: [facility] INVASIVE CV LAB;  Service: Cardiovascular;  Laterality: N/A;   PERIPHERAL VASCULAR CATHETERIZATION N/A 11/25/2015   Procedure: Lower Extremity Angiography;  Surgeon: Runell Gess, MD;  Location: [facility] INVASIVE CV LAB;  Service: Cardiovascular;  Laterality: N/A;   PERIPHERAL VASCULAR CATHETERIZATION Left 11/25/2015   Procedure: Peripheral Vascular Intervention;  Surgeon: Runell Gess, MD;  Location: [facility] INVASIVE CV LAB;  Service: Cardiovascular;  Laterality: Left;  external iliac   PERIPHERAL VASCULAR CATHETERIZATION N/A 01/03/2016   Procedure: Lower Extremity Angiography;  Surgeon: Runell Gess, MD;  Location: [facility] INVASIVE CV LAB;  Service: Cardiovascular;  Laterality: N/A;   PERIPHERAL  VASCULAR CATHETERIZATION Left 01/03/2016   Procedure: Peripheral Vascular Intervention;  Surgeon: Runell Gess, MD;  Location: [facility] INVASIVE CV LAB;  Service: Cardiovascular;  Laterality: Left;  SFA   PERIPHERAL VASCULAR CATHETERIZATION Right 02/14/2016   Procedure: Peripheral Vascular Atherectomy;  Surgeon: Runell Gess, MD;  Location: [facility] INVASIVE CV LAB;  Service: Cardiovascular;  Laterality: Right;  SFA   POPLITEAL ARTERY STENT  01/03/2016   Contralateral access with a 7 French crossover sheath (second order catheter placement)   TONSILLECTOMY AND ADENOIDECTOMY     TUMOR EXCISION Right ~ 2005   cancerous tumor removed from shoulder   TUMOR EXCISION Right ~ 2000   benign tumor removed from under shoulder   TUMOR EXCISION Left 06/02/2010   resection of Lt SFA wth interposition of Gore-Tex graft   Patient Active Problem List   Diagnosis Date Noted   Complicated UTI (urinary tract infection) 05/03/2023   Hx of CABG 12/20/2022   Urinary tract infection with hematuria 12/18/2022   Sepsis (HCC) 12/18/2022   Iron deficiency 12/18/2022    Renal insufficiency 12/18/2022   NSTEMI (non-ST elevated myocardial infarction) (HCC) 11/30/2022   Arthritis pain 11/30/2022   BPH (benign prostatic hyperplasia) 11/30/2022   Iron deficiency anemia due to chronic blood loss 11/15/2022   Acute encephalopathy 02/06/2022   Elevated troponin    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   Hematoma of right hip 01/10/2021   Primary osteoarthritis of right elbow 01/10/2021   Chronic anticoagulation 08/12/2019   Dyslipidemia (high LDL; low HDL) 03/05/2019   Unstable angina (HCC) 11/11/2018   Hypercholesterolemia 02/20/2018   PAF (paroxysmal atrial fibrillation) (HCC) 02/20/2018   CAD (coronary artery disease) of bypass graft 02/06/2018   CAD, multiple vessel 02/06/2018   Leukocytosis 02/16/2016   Transient hypotension 02/15/2016   Peripheral arterial disease (HCC)    Pacemaker 04/23/2015   Positive cardiac stress test    Chest pain    OSA on CPAP 11/04/2012   SSS (sick sinus syndrome), medtronic adapta    Hyperlipidemia due to type 2 diabetes mellitus (HCC)    Superficial femoral artery injury 05/09/2011   SPRAIN&STRAIN OTHER SPECIFIED SITES KNEE&LEG 01/31/2010   Type 2 diabetes mellitus with complication, without long-term current use of insulin (HCC) 11/26/2006   Essential hypertension 11/26/2006    PCP: Thana Ates, MD   REFERRING PROVIDER: Thana Ates, MD   REFERRING DIAG: Z91.81 History of falls  THERAPY DIAG:  Unsteadiness on feet  Muscle weakness (generalized)  Balance disorder  Rationale for Evaluation and Treatment: Rehabilitation  ONSET DATE: October   SUBJECTIVE:   SUBJECTIVE STATEMENT: Patient reports he heard from MD who told him to keep monitoring sugar levels. Patient states he is feeling stiff today.  EVAL: Since October had a MI and have been in the hospital a lot (most recent 05/02/23) I've lost 10-15 pounds. He has had 2 infusions of iron. I've been fatigued a lot. My DM is kicking up.  I'm wearing a monitor, but don't quite understand it. It climbs in a heartbeat to 300 and then in the morning it's down to 130. I eat oatmeal and it shoots back up to 300. (Dr. Margaretann Loveless). He does not use the cane at home. Only when out. I'm losing the feeling in my feet. Gabapentin doesn't help. Wears a lift in the R shoe due to leg length discrepancy. Some left knee pain and wears brace.    PERTINENT HISTORY: CLL (leukemia), PAD, DM, CAD, PACEMAKER, on a catheter  x 5 months PAIN:  Are you having pain? No - later reports he wears a brace for L knee pain.  PRECAUTIONS: Fall and ICD/Pacemaker  RED FLAGS: None   WEIGHT BEARING RESTRICTIONS: No  FALLS:  Has patient fallen in last 6 months? Yes. Number of falls 6 tripping  LIVING ENVIRONMENT: Lives with: lives with their spouse Lives in: House/apartment Stairs: Yes: Internal: 13 steps; on left going up and External: 1 steps; no rail Has following equipment at home: Single point cane, Walker - 2 wheeled, and Environmental consultant - 4 wheeled  OCCUPATION: retired  PLOF: Independent and daughter does most of the driving, but he does drive  PATIENT GOALS: to feel less clumsy  NEXT MD VISIT: a few months  OBJECTIVE:  Note: Objective measures were completed at Evaluation unless otherwise noted.  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   MUSCLE LENGTH: Tight B gastrocs  POSTURE: rounded shoulders, forward head, and posterior pelvic tilt   LOWER EXTREMITY ROM: WFL for tasks assessed  Active ROM Right eval Left eval  Hip flexion    Hip extension    Hip abduction    Hip adduction    Hip internal rotation    Hip external rotation    Knee flexion    Knee extension    Ankle dorsiflexion    Ankle plantarflexion    Ankle inversion    Ankle eversion     (Blank rows =  not tested)  LOWER EXTREMITY MMT: ABD/ADD in sitting  MMT Right eval Left eval  Hip flexion 4- 4  Hip extension    Hip abduction 5 5  Hip adduction 5 5  Hip internal rotation    Hip external rotation    Knee flexion    Knee extension 5 5  Ankle dorsiflexion 5 5  Ankle plantarflexion    Ankle inversion    Ankle eversion     (Blank rows = not tested)   FUNCTIONAL TESTS:  5 times sit to stand: 15.12 sec (one LOB and sat back down) Timed up and go (TUG): 10.21 sec Berg Balance Scale: 44/56 37-45 significant fall risk (>80%) mCTSIB: 120/120 (mod-min sway on C4)  GAIT: Distance walked: 20 Assistive device utilized: Single point cane Level of assistance: Modified independence Comments: Wide BOS, B hip ER   [facility] Adult PT Treatment:                                                DATE: 07/05/2023 Therapeutic Exercise: Mat Table:  Hooklying hip add isometric (ball squeeze) 10x5" Hooklying clamshells + blue TB x10 Bridges + hip abd isometric with blue TB x10 Side lying clamshells + RTB x15 (B) Standing: Resisted hip abd x10  Resisted side stepping  Neuromuscular re-ed: Standing in corner with chair in front: mREO + slow head turns/nods mREC + slow head turns/nods Staggered stance EO + slow head turns/nods Staggered stance EC Therapeutic Activity: mCTSIB (see above)  TREATMENT DATE:   07/03/23 See pt ed and HEP   PATIENT EDUCATION:  Education details: Updated HEP  Person educated: Patient Education method: Explanation, Demonstration, and Handouts Education comprehension: verbalized understanding and returned demonstration  HOME EXERCISE PROGRAM: Access Code: [email or web address] URL: [email or web address] Date: 07/05/2023 Prepared by: Carlynn Herald  Exercises - Sit to Stand  - 3 x daily - 7 x weekly - 1-2 sets - 10 reps - Supine  Bridge  - 2 x daily - 7 x weekly - 1-3 sets - 10 reps - Clam with Resistance  - 1 x daily - 7 x weekly - 3 sets - 10 reps - Standing Balance in Corner with Eyes Closed  - 1 x daily - 7 x weekly - 3 sets - 10 reps - 10 sec hold - Semi-Tandem Corner Balance: Eyes Open With Head Turns  - 1 x daily - 7 x weekly - 3 sets - 10 reps - Semi-Tandem Corner Balance With Eyes Closed  - 1 x daily - 7 x weekly - 3 sets - 10 reps  ASSESSMENT:  CLINICAL IMPRESSION: Glute strengthening progressed in side lying and standing, focusing on glute med. Initial moderate sway exhibited with eyes closed balance progression of added head turns/nods; postural stability improved over time to minimal sway. Patient cues to practice balance exercises in corner with chair in front of him to maximize safety. HEP updated with LE exercise and balance.   EVAL: Patient is a 83 y.o. male who was seen today for physical therapy evaluation and treatment for unsteadiness and a h/o falls. He scored 44/56 on the BERG indicating he is a significant fall risk (>80%) and reports 6 falls over the past 6 months. He demonstrates LE functional weakness R>L and his significantly challenged with SLS activities. He also reports decreased sensation in B feet. He uses his cane in the community, but not at home. PT advised pt he should be using an AD at all times. He also reports he walks backwards down his basement steps using a hand rail. He will benefit from skilled PT to address these deficits.    OBJECTIVE IMPAIRMENTS: decreased balance, decreased strength, decreased safety awareness, impaired flexibility, impaired sensation, postural dysfunction, and pain.   ACTIVITY LIMITATIONS: stairs and locomotion level  PARTICIPATION LIMITATIONS: meal prep, cleaning, laundry, community activity, and yard work  PERSONAL FACTORS: Age, Past/current experiences, Time since onset of injury/illness/exacerbation, and 3+ comorbidities: leukemia, iron deficiency, DM,  L knee pain  are also affecting patient's functional outcome.   REHAB POTENTIAL: Good  CLINICAL DECISION MAKING: Unstable/unpredictable  EVALUATION COMPLEXITY: High   GOALS: Goals reviewed with patient? Yes  SHORT TERM GOALS: Target date: 07/31/2023   Patient will be independent with initial HEP. Baseline:  Goal status: INITIAL  2.  Patient will demonstrate decreased risk for falls by scoring 48/56 on the BERG. Baseline: 44 Goal status: INITIAL  3.  Patient will be educated on strategies to decrease risk of falls.  Baseline:  Goal status: INITIAL   LONG TERM GOALS: Target date: 08/28/2023  Patient will be independent with advanced/ongoing HEP to improve outcomes and carryover.  Baseline:  Goal status: INITIAL  2.  Patient will score 52 on Berg Balance test to demonstrate lower risk of falls. (MCID= 8 points) .  Baseline: 44 Goal status: INITIAL  3.  Patient will be able to step up/down curb safely with LRAD for safety with community ambulation.  Baseline:  Goal status: INITIAL   4.  Patient will demonstrate  decreased 5XSTS by 2-3 seconds and no LOB showing improved functional strength. Baseline: 15.12 sec Goal status: INITIAL  5.  Patient able to climb stairs safely with one rail support.  Baseline: descends stairs backwards Goal status: INITIAL  6.  Patient will report no falls during therapy episode. Baseline:  Goal status: INITIAL   PLAN:  PT FREQUENCY: 2x/week  PT DURATION: 8 weeks  PLANNED INTERVENTIONS: 97164- PT Re-evaluation, 97110-Therapeutic exercises, 97530- Therapeutic activity, [id number]- Neuromuscular re-education, 97535- Self Care, [id number]- Manual therapy, [phone]- Gait training, Patient/Family education, Balance training, Stair training, and DME instructions  PLAN FOR NEXT SESSION: Balance --> proprioception. Gluteus med focus with general LE strength, stairs, encourage AD at all times right now. Be aware of blood sugar levels as pt says they have  been inconsistent.    Carlynn Herald, PTA  07/05/2023, 2:20 PM

## 2023-07-09 NOTE — Therapy (Signed)
 OUTPATIENT PHYSICAL THERAPY LOWER EXTREMITY AND BALANCE TREATMENT   Patient Name: Robert Burns MRN: [id number] DOB:06/02/1940, 83 y.o., male Today's Date: 07/10/2023  END OF SESSION:  PT End of Session - 07/10/23 1155     Visit Number 3    Date for PT Re-Evaluation 08/28/23    Authorization Type [employer] MCR    Progress Note Due on Visit 10    PT Start Time 1152    PT Stop Time 1231    PT Time Calculation (min) 39 min    Activity Tolerance Patient tolerated treatment well    Behavior During Therapy WFL for tasks assessed/performed             Past Medical History:  Diagnosis Date   Anemia    Anxiety    Arthritis    "all over" (11/25/2015)   CAD (coronary artery disease)    a. s/p CABG  06/2002; b. 02/01/15 PCI: DES to prox SVG to PDA, staged PCI of SVG to Diag in 02/2015; c. 04/2017 Cath/PCI: LM nl, LAD 100ost, 21m, 75d, LCX 60ost, OM2 80, RCA 100ost, RPDA 80, LIMA->LAD ok, VG->D1 patent stent, VG->RPDA patent stent, 50p, VG->OM1->OM2 90p (3.0x24 Synergy DES), 100 between OM1->OM2 (med rx).   Cancer [facility])    Right Shoulder, Left Leg- BCC, SCC, AND MELANOMA   Colon polyp    Elevated cholesterol    Epithelioid hemangioendothelioma    s/p resection of left SFA/mass with interposition 6 mm GoreTex graft 06/02/10, s/p repeat resection for positive margins 09/22/10   High blood pressure    Hyperlipidemia    Hypertension    Leg pain    OSA on CPAP    PAD (peripheral artery disease) (HCC)    a. 10/2002 L SFA PTA/BMS; b. 8/17 LE Angio: LEIA 90 (9x40 self exp stent), LSFA short segment prox occlusion (staged PTA/stenting 01/03/2016), patent mid stent, RSFA 73m (staged PTA/DEB 02/14/2016).   Presence of permanent cardiac pacemaker    Medtronic   Renal insufficiency 12/18/2022   SSS (sick sinus syndrome) (HCC)    a. s/p PPM in 2007 with gen change 04/2015 - Medtronic Adapta ADDRL1, ser # [id number].   Type II diabetes mellitus (HCC)    Type II   Urgency of urination    Past Surgical  History:  Procedure Laterality Date   CARDIAC CATHETERIZATION N/A 02/01/2015   Procedure: Left Heart Cath and Coronary Angiography;  Surgeon: Runell Gess, MD;  Location: [facility] INVASIVE CV LAB;  Service: Cardiovascular;  Laterality: N/A;   CARDIAC CATHETERIZATION N/A 02/01/2015   Procedure: Coronary Stent Intervention;  Surgeon: Runell Gess, MD;  Location: [facility] INVASIVE CV LAB;  Service: Cardiovascular;  Laterality: N/A;   CARDIAC CATHETERIZATION  06/2002   "just before bypass OR"   CARDIAC CATHETERIZATION N/A 03/01/2015   Procedure: Coronary Stent Intervention;  Surgeon: Runell Gess, MD;  Location: [facility] INVASIVE CV LAB;  Service: Cardiovascular;  Laterality: N/A;   CARDIAC CATHETERIZATION  02/06/2018   COLONOSCOPY     CORONARY ANGIOPLASTY     CORONARY ARTERY BYPASS GRAFT  06/2002   x5, LIMA-LAD;VG- Diag; seq VG- ramus & OM branch; VG-PDA   CORONARY STENT INTERVENTION N/A 04/12/2017   Procedure: CORONARY STENT INTERVENTION;  Surgeon: Runell Gess, MD;  Location: [facility] INVASIVE CV LAB;  Service: Cardiovascular;  Laterality: N/A;   CORONARY STENT INTERVENTION N/A 02/08/2018   Procedure: CORONARY STENT INTERVENTION;  Surgeon: Corky Crafts, MD;  Location: [facility] INVASIVE CV LAB;  Service: Cardiovascular;  Laterality: N/A;   CORONARY STENT INTERVENTION N/A 11/12/2018   Procedure: CORONARY STENT INTERVENTION;  Surgeon: Iran Ouch, MD;  Location: [facility] INVASIVE CV LAB;  Service: Cardiovascular;  Laterality: N/A;   CORONARY STENT INTERVENTION N/A 06/22/2020   Procedure: CORONARY STENT INTERVENTION;  Surgeon: Corky Crafts, MD;  Location: [facility] INVASIVE CV LAB;  Service: Cardiovascular;  Laterality: N/A;   EP IMPLANTABLE DEVICE N/A 04/23/2015   Procedure: PPM Generator Changeout;  Surgeon: Thurmon Fair, MD;  Location: [facility] INVASIVE CV LAB;  Service: Cardiovascular;  Laterality: N/A;   FALSE ANEURYSM REPAIR Left 11/29/2018   Procedure: REPAIR FALSE ANEURYSM LEFT RADIAL ARTERY;  Surgeon:  Larina Earthly, MD;  Location: [facility] OR;  Service: Vascular;  Laterality: Left;   FEMORAL ARTERY STENT Left ~ 2014   "taken out of my leg; couldn' catorgorize what kind so the put it under all 3"; cataroziepitheloid hemanioendotheliomau   FOOT FRACTURE SURGERY Left 1973   FRACTURE SURGERY     INSERT / REPLACE / REMOVE PACEMAKER  10/13/05   right side, medtronic Adapta   IR 3D INDEPENDENT WKST  02/26/2023   IR ANGIOGRAM PELVIS SELECTIVE OR SUPRASELECTIVE  02/26/2023   IR ANGIOGRAM SELECTIVE EACH ADDITIONAL VESSEL  02/26/2023   IR ANGIOGRAM SELECTIVE EACH ADDITIONAL VESSEL  02/26/2023   IR EMBO TUMOR ORGAN ISCHEMIA INFARCT INC GUIDE ROADMAPPING  02/26/2023   IR RADIOLOGIST EVAL & MGMT  02/02/2023   IR US GUIDE VASC ACCESS LEFT  02/26/2023   IR US GUIDE VASC ACCESS LEFT  02/26/2023   IR US GUIDE VASC ACCESS LEFT  02/26/2023   KNEE HARDWARE REMOVAL Right 1950's   "3-4 months after the insertion"   KNEE SURGERY Right 1950's   "broke my lower leg; had to put pin in my knee to keep lower leg in place til it healed"   LEFT HEART CATH AND CORS/GRAFTS ANGIOGRAPHY N/A 04/12/2017   Procedure: LEFT HEART CATH AND CORS/GRAFTS ANGIOGRAPHY;  Surgeon: Runell Gess, MD;  Location: [facility] INVASIVE CV LAB;  Service: Cardiovascular;  Laterality: N/A;   LEFT HEART CATH AND CORS/GRAFTS ANGIOGRAPHY N/A 02/06/2018   Procedure: LEFT HEART CATH AND CORS/GRAFTS ANGIOGRAPHY;  Surgeon: Lennette Bihari, MD;  Location: [facility] INVASIVE CV LAB;  Service: Cardiovascular;  Laterality: N/A;   LEFT HEART CATH AND CORS/GRAFTS ANGIOGRAPHY N/A 11/12/2018   Procedure: LEFT HEART CATH AND CORS/GRAFTS ANGIOGRAPHY;  Surgeon: Iran Ouch, MD;  Location: [facility] INVASIVE CV LAB;  Service: Cardiovascular;  Laterality: N/A;   LEFT HEART CATH AND CORS/GRAFTS ANGIOGRAPHY N/A 06/22/2020   Procedure: LEFT HEART CATH AND CORS/GRAFTS ANGIOGRAPHY;  Surgeon: Corky Crafts, MD;  Location: [facility] INVASIVE CV LAB;  Service: Cardiovascular;  Laterality: N/A;    LEFT HEART CATH AND CORS/GRAFTS ANGIOGRAPHY N/A 12/01/2022   Procedure: LEFT HEART CATH AND CORS/GRAFTS ANGIOGRAPHY;  Surgeon: Swazil[place], Peter M, MD;  Location: [facility] INVASIVE CV LAB;  Service: Cardiovascular;  Laterality: N/A;   PERIPHERAL VASCULAR CATHETERIZATION N/A 11/25/2015   Procedure: Lower Extremity Angiography;  Surgeon: Runell Gess, MD;  Location: [facility] INVASIVE CV LAB;  Service: Cardiovascular;  Laterality: N/A;   PERIPHERAL VASCULAR CATHETERIZATION Left 11/25/2015   Procedure: Peripheral Vascular Intervention;  Surgeon: Runell Gess, MD;  Location: [facility] INVASIVE CV LAB;  Service: Cardiovascular;  Laterality: Left;  external iliac   PERIPHERAL VASCULAR CATHETERIZATION N/A 01/03/2016   Procedure: Lower Extremity Angiography;  Surgeon: Runell Gess, MD;  Location: [facility] INVASIVE CV LAB;  Service: Cardiovascular;  Laterality: N/A;  PERIPHERAL VASCULAR CATHETERIZATION Left 01/03/2016   Procedure: Peripheral Vascular Intervention;  Surgeon: Runell Gess, MD;  Location: [facility] INVASIVE CV LAB;  Service: Cardiovascular;  Laterality: Left;  SFA   PERIPHERAL VASCULAR CATHETERIZATION Right 02/14/2016   Procedure: Peripheral Vascular Atherectomy;  Surgeon: Runell Gess, MD;  Location: [facility] INVASIVE CV LAB;  Service: Cardiovascular;  Laterality: Right;  SFA   POPLITEAL ARTERY STENT  01/03/2016   Contralateral access with a 7 French crossover sheath (second order catheter placement)   TONSILLECTOMY AND ADENOIDECTOMY     TUMOR EXCISION Right ~ 2005   cancerous tumor removed from shoulder   TUMOR EXCISION Right ~ 2000   benign tumor removed from under shoulder   TUMOR EXCISION Left 06/02/2010   resection of Lt SFA wth interposition of Gore-Tex graft   Patient Active Problem List   Diagnosis Date Noted   Complicated UTI (urinary tract infection) 05/03/2023   Hx of CABG 12/20/2022   Urinary tract infection with hematuria 12/18/2022   Sepsis (HCC) 12/18/2022   Iron deficiency 12/18/2022    Renal insufficiency 12/18/2022   NSTEMI (non-ST elevated myocardial infarction) (HCC) 11/30/2022   Arthritis pain 11/30/2022   BPH (benign prostatic hyperplasia) 11/30/2022   Iron deficiency anemia due to chronic blood loss 11/15/2022   Acute encephalopathy 02/06/2022   Elevated troponin    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   Hematoma of right hip 01/10/2021   Primary osteoarthritis of right elbow 01/10/2021   Chronic anticoagulation 08/12/2019   Dyslipidemia (high LDL; low HDL) 03/05/2019   Unstable angina (HCC) 11/11/2018   Hypercholesterolemia 02/20/2018   PAF (paroxysmal atrial fibrillation) (HCC) 02/20/2018   CAD (coronary artery disease) of bypass graft 02/06/2018   CAD, multiple vessel 02/06/2018   Leukocytosis 02/16/2016   Transient hypotension 02/15/2016   Peripheral arterial disease (HCC)    Pacemaker 04/23/2015   Positive cardiac stress test    Chest pain    OSA on CPAP 11/04/2012   SSS (sick sinus syndrome), medtronic adapta    Hyperlipidemia due to type 2 diabetes mellitus (HCC)    Superficial femoral artery injury 05/09/2011   SPRAIN&STRAIN OTHER SPECIFIED SITES KNEE&LEG 01/31/2010   Type 2 diabetes mellitus with complication, without long-term current use of insulin (HCC) 11/26/2006   Essential hypertension 11/26/2006    PCP: Thana Ates, MD   REFERRING PROVIDER: Thana Ates, MD   REFERRING DIAG: Z91.81 History of falls  THERAPY DIAG:  Unsteadiness on feet  Balance disorder  Muscle weakness (generalized)  Rationale for Evaluation and Treatment: Rehabilitation  ONSET DATE: October   SUBJECTIVE:   SUBJECTIVE STATEMENT: Feeling a little tight today.   EVAL: Since October had a MI and have been in the hospital a lot (most recent 05/02/23) I've lost 10-15 pounds. He has had 2 infusions of iron. I've been fatigued a lot. My DM is kicking up. I'm wearing a monitor, but don't quite understand it. It climbs in a heartbeat to 300 and  then in the morning it's down to 130. I eat oatmeal and it shoots back up to 300. (Dr. Margaretann Loveless). He does not use the cane at home. Only when out. I'm losing the feeling in my feet. Gabapentin doesn't help. Wears a lift in the R shoe due to leg length discrepancy. Some left knee pain and wears brace.    PERTINENT HISTORY: CLL (leukemia), PAD, DM, CAD, PACEMAKER, on a catheter x 5 months PAIN:  Are you having pain? No - later reports he  wears a brace for L knee pain.  PRECAUTIONS: Fall and ICD/Pacemaker  RED FLAGS: None   WEIGHT BEARING RESTRICTIONS: No  FALLS:  Has patient fallen in last 6 months? Yes. Number of falls 6 tripping  LIVING ENVIRONMENT: Lives with: lives with their spouse Lives in: House/apartment Stairs: Yes: Internal: 13 steps; on left going up and External: 1 steps; no rail Has following equipment at home: Single point cane, Walker - 2 wheeled, and Environmental consultant - 4 wheeled  OCCUPATION: retired  PLOF: Independent and daughter does most of the driving, but he does drive  PATIENT GOALS: to feel less clumsy  NEXT MD VISIT: a few months  OBJECTIVE:  Note: Objective measures were completed at Evaluation unless otherwise noted.  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   MUSCLE LENGTH: Tight B gastrocs  POSTURE: rounded shoulders, forward head, and posterior pelvic tilt   LOWER EXTREMITY ROM: WFL for tasks assessed  Active ROM Right eval Left eval  Hip flexion    Hip extension    Hip abduction    Hip adduction    Hip internal rotation    Hip external rotation    Knee flexion    Knee extension    Ankle dorsiflexion    Ankle plantarflexion    Ankle inversion    Ankle eversion     (Blank rows = not tested)  LOWER EXTREMITY MMT: ABD/ADD in sitting  MMT Right eval Left eval  Hip  flexion 4- 4  Hip extension    Hip abduction 5 5  Hip adduction 5 5  Hip internal rotation    Hip external rotation    Knee flexion    Knee extension 5 5  Ankle dorsiflexion 5 5  Ankle plantarflexion    Ankle inversion    Ankle eversion     (Blank rows = not tested)   FUNCTIONAL TESTS:  5 times sit to stand: 15.12 sec (one LOB and sat back down) Timed up and go (TUG): 10.21 sec Berg Balance Scale: 44/56 37-45 significant fall risk (>80%) mCTSIB: 120/120 (mod-min sway on C4)  GAIT: Distance walked: 20 Assistive device utilized: Single point cane Level of assistance: Modified independence Comments: Wide BOS, B hip ER    [facility] Adult PT Treatment:                                                DATE:  07/10/2023 Therapeutic Exercise: Mat Table:  Hooklying hip add isometric (ball squeeze) 10x5" Hooklying clamshells + blue TB 2x10 Bridges + hip abd isometric with blue - HOLD - pulls on catheter Side lying clamshells + BTB x15 (B) Standing: Resisted hip abd x10  B BTB at thighs  Resisted hip ext at 45 deg BTB x 10 B Resisted side stepping 4 steps x 6 B   BTB Neuromuscular re-ed: Single leg glute med strength pushing small ball into wall with contralateral limb 10 sec hold x 5  Standing in corner with chair in front: Romberg with EO/EC Staggered stance EO R/L + slow head turns/nods-  - pt reports more difficulty with left leg back Staggered stance EC R/L  + slow head turns/nods  Therapeutic Activity: Squat  to mat table + pad x 10  [facility] Adult PT Treatment:                                                DATE:  07/05/2023 Therapeutic Exercise: Mat Table:  Hooklying hip add isometric (ball squeeze) 10x5" Hooklying clamshells + blue TB x10 Bridges + hip abd isometric with blue TB x10 Side lying clamshells + RTB x15 (B) Standing: Resisted hip abd x10  Resisted side stepping  Neuromuscular re-ed: Standing in corner with chair in front: mREO + slow head turns/nods mREC +  slow head turns/nods Staggered stance EO + slow head turns/nods Staggered stance EC Therapeutic Activity: mCTSIB (see above)                                                                                                                              TREATMENT DATE:   07/03/23 See pt ed and HEP   PATIENT EDUCATION:  Education details: Updated HEP  Person educated: Patient Education method: Explanation, Demonstration, and Handouts Education comprehension: verbalized understanding and returned demonstration  HOME EXERCISE PROGRAM: Access Code: [email or web address] URL: [email or web address] Date: 07/05/2023 Prepared by: Carlynn Herald  Exercises - Sit to Stand  - 3 x daily - 7 x weekly - 1-2 sets - 10 reps - Supine Bridge  - 2 x daily - 7 x weekly - 1-3 sets - 10 reps - Clam with Resistance  - 1 x daily - 7 x weekly - 3 sets - 10 reps - Standing Balance in Corner with Eyes Closed  - 1 x daily - 7 x weekly - 3 sets - 10 reps - 10 sec hold - Semi-Tandem Corner Balance: Eyes Open With Head Turns  - 1 x daily - 7 x weekly - 3 sets - 10 reps - Semi-Tandem Corner Balance With Eyes Closed  - 1 x daily - 7 x weekly - 3 sets - 10 reps  ASSESSMENT:  CLINICAL IMPRESSION: Patient presents without AD today. Advised that he needs to be using the cane at all times due to unsteadiness. He states he will bring it next time. He does well with balance activiites. He has a lot of sway, but no LOB. SL glute strengthening is felt on L>R in both directions indicating greater weakness here.   EVAL: Patient is a 83 y.o. male who was seen today for physical therapy evaluation and treatment for unsteadiness and a h/o falls. He scored 44/56 on the BERG indicating he is a significant fall risk (>80%) and reports 6 falls over the past 6 months. He demonstrates LE functional weakness R>L and his significantly challenged with SLS activities. He also reports decreased sensation in B feet. He uses his cane in the  community, but not at home. PT advised pt he should be using an AD  at all times. He also reports he walks backwards down his basement steps using a hand rail. He will benefit from skilled PT to address these deficits.    OBJECTIVE IMPAIRMENTS: decreased balance, decreased strength, decreased safety awareness, impaired flexibility, impaired sensation, postural dysfunction, and pain.   ACTIVITY LIMITATIONS: stairs and locomotion level  PARTICIPATION LIMITATIONS: meal prep, cleaning, laundry, community activity, and yard work  PERSONAL FACTORS: Age, Past/current experiences, Time since onset of injury/illness/exacerbation, and 3+ comorbidities: leukemia, iron deficiency, DM, L knee pain  are also affecting patient's functional outcome.   REHAB POTENTIAL: Good  CLINICAL DECISION MAKING: Unstable/unpredictable  EVALUATION COMPLEXITY: High   GOALS: Goals reviewed with patient? Yes  SHORT TERM GOALS: Target date: 07/31/2023   Patient will be independent with initial HEP. Baseline:  Goal status: INITIAL  2.  Patient will demonstrate decreased risk for falls by scoring 48/56 on the BERG. Baseline: 44 Goal status: INITIAL  3.  Patient will be educated on strategies to decrease risk of falls.  Baseline:  Goal status: INITIAL   LONG TERM GOALS: Target date: 08/28/2023  Patient will be independent with advanced/ongoing HEP to improve outcomes and carryover.  Baseline:  Goal status: INITIAL  2.  Patient will score 52 on Berg Balance test to demonstrate lower risk of falls. (MCID= 8 points) .  Baseline: 44 Goal status: INITIAL  3.  Patient will be able to step up/down curb safely with LRAD for safety with community ambulation.  Baseline:  Goal status: INITIAL   4.  Patient will demonstrate decreased 5XSTS by 2-3 seconds and no LOB showing improved functional strength. Baseline: 15.12 sec Goal status: INITIAL  5.  Patient able to climb stairs safely with one rail support.   Baseline: descends stairs backwards Goal status: INITIAL  6.  Patient will report no falls during therapy episode. Baseline:  Goal status: INITIAL   PLAN:  PT FREQUENCY: 2x/week  PT DURATION: 8 weeks  PLANNED INTERVENTIONS: [id number]- PT Re-evaluation, 97110-Therapeutic exercises, 97530- Therapeutic activity, [id number]- Neuromuscular re-education, 97535- Self Care, [id number]- Manual therapy, [phone]- Gait training, Patient/Family education, Balance training, Stair training, and DME instructions  PLAN FOR NEXT SESSION: Balance --> proprioception. Gluteus med focus L>R with general LE strength, stairs, encourage AD at all times right now. Be aware of blood sugar levels as pt says they have been inconsistent.    Solon Palm, PT 07/10/2023, 12:38 PM

## 2023-07-10 ENCOUNTER — Encounter: Payer: Self-pay | Admitting: Physical Therapy

## 2023-07-10 ENCOUNTER — Ambulatory Visit: Attending: Internal Medicine | Admitting: Physical Therapy

## 2023-07-10 DIAGNOSIS — R293 Abnormal posture: Secondary | ICD-10-CM | POA: Diagnosis not present

## 2023-07-10 DIAGNOSIS — R29898 Other symptoms and signs involving the musculoskeletal system: Secondary | ICD-10-CM | POA: Insufficient documentation

## 2023-07-10 DIAGNOSIS — R2681 Unsteadiness on feet: Secondary | ICD-10-CM | POA: Insufficient documentation

## 2023-07-10 DIAGNOSIS — R42 Dizziness and giddiness: Secondary | ICD-10-CM | POA: Insufficient documentation

## 2023-07-10 DIAGNOSIS — M6281 Muscle weakness (generalized): Secondary | ICD-10-CM | POA: Insufficient documentation

## 2023-07-10 DIAGNOSIS — M542 Cervicalgia: Secondary | ICD-10-CM | POA: Insufficient documentation

## 2023-07-10 DIAGNOSIS — G44221 Chronic tension-type headache, intractable: Secondary | ICD-10-CM | POA: Diagnosis not present

## 2023-07-10 DIAGNOSIS — R2689 Other abnormalities of gait and mobility: Secondary | ICD-10-CM | POA: Diagnosis not present

## 2023-07-12 ENCOUNTER — Other Ambulatory Visit: Payer: Self-pay

## 2023-07-12 MED ORDER — PANTOPRAZOLE SODIUM 40 MG PO TBEC: 40.0000 mg | DELAYED_RELEASE_TABLET | Freq: Every day | ORAL | 1 refills | Status: DC

## 2023-07-13 ENCOUNTER — Ambulatory Visit

## 2023-07-13 DIAGNOSIS — R2681 Unsteadiness on feet: Secondary | ICD-10-CM | POA: Diagnosis not present

## 2023-07-13 DIAGNOSIS — R29898 Other symptoms and signs involving the musculoskeletal system: Secondary | ICD-10-CM | POA: Diagnosis not present

## 2023-07-13 DIAGNOSIS — R2689 Other abnormalities of gait and mobility: Secondary | ICD-10-CM

## 2023-07-13 DIAGNOSIS — R293 Abnormal posture: Secondary | ICD-10-CM

## 2023-07-13 DIAGNOSIS — M6281 Muscle weakness (generalized): Secondary | ICD-10-CM | POA: Diagnosis not present

## 2023-07-13 DIAGNOSIS — M542 Cervicalgia: Secondary | ICD-10-CM | POA: Diagnosis not present

## 2023-07-13 DIAGNOSIS — G44221 Chronic tension-type headache, intractable: Secondary | ICD-10-CM

## 2023-07-13 DIAGNOSIS — R42 Dizziness and giddiness: Secondary | ICD-10-CM | POA: Diagnosis not present

## 2023-07-13 NOTE — Therapy (Signed)
 OUTPATIENT PHYSICAL THERAPY LOWER EXTREMITY AND BALANCE TREATMENT   Patient Name: Robert Burns MRN: [id number] DOB:March 24, 1941, 83 y.o., male Today's Date: 07/13/2023  END OF SESSION:  PT End of Session - 07/13/23 1014     Visit Number 4    Date for PT Re-Evaluation 08/28/23    Authorization Type [employer] MCR    Authorization Time Period 17 VISITS APPROVED FOR PT 07/03/2023-08/28/2023    Authorization - Visit Number 4    Authorization - Number of Visits 17    Progress Note Due on Visit 10    PT Start Time 1015    PT Stop Time 1100    PT Time Calculation (min) 45 min    Activity Tolerance Patient tolerated treatment well    Behavior During Therapy WFL for tasks assessed/performed            Past Medical History:  Diagnosis Date   Anemia    Anxiety    Arthritis    "all over" (11/25/2015)   CAD (coronary artery disease)    a. s/p CABG  06/2002; b. 02/01/15 PCI: DES to prox SVG to PDA, staged PCI of SVG to Diag in 02/2015; c. 04/2017 Cath/PCI: LM nl, LAD 100ost, 26m, 75d, LCX 60ost, OM2 80, RCA 100ost, RPDA 80, LIMA->LAD ok, VG->D1 patent stent, VG->RPDA patent stent, 50p, VG->OM1->OM2 90p (3.0x24 Synergy DES), 100 between OM1->OM2 (med rx).   Cancer [facility])    Right Shoulder, Left Leg- BCC, SCC, AND MELANOMA   Colon polyp    Elevated cholesterol    Epithelioid hemangioendothelioma    s/p resection of left SFA/mass with interposition 6 mm GoreTex graft 06/02/10, s/p repeat resection for positive margins 09/22/10   High blood pressure    Hyperlipidemia    Hypertension    Leg pain    OSA on CPAP    PAD (peripheral artery disease) (HCC)    a. 10/2002 L SFA PTA/BMS; b. 8/17 LE Angio: LEIA 90 (9x40 self exp stent), LSFA short segment prox occlusion (staged PTA/stenting 01/03/2016), patent mid stent, RSFA 56m (staged PTA/DEB 02/14/2016).   Presence of permanent cardiac pacemaker    Medtronic   Renal insufficiency 12/18/2022   SSS (sick sinus syndrome) (HCC)    a. s/p PPM in 2007 with  gen change 04/2015 - Medtronic Adapta ADDRL1, ser # [id number].   Type II diabetes mellitus (HCC)    Type II   Urgency of urination    Past Surgical History:  Procedure Laterality Date   CARDIAC CATHETERIZATION N/A 02/01/2015   Procedure: Left Heart Cath and Coronary Angiography;  Surgeon: Runell Gess, MD;  Location: [facility] INVASIVE CV LAB;  Service: Cardiovascular;  Laterality: N/A;   CARDIAC CATHETERIZATION N/A 02/01/2015   Procedure: Coronary Stent Intervention;  Surgeon: Runell Gess, MD;  Location: [facility] INVASIVE CV LAB;  Service: Cardiovascular;  Laterality: N/A;   CARDIAC CATHETERIZATION  06/2002   "just before bypass OR"   CARDIAC CATHETERIZATION N/A 03/01/2015   Procedure: Coronary Stent Intervention;  Surgeon: Runell Gess, MD;  Location: [facility] INVASIVE CV LAB;  Service: Cardiovascular;  Laterality: N/A;   CARDIAC CATHETERIZATION  02/06/2018   COLONOSCOPY     CORONARY ANGIOPLASTY     CORONARY ARTERY BYPASS GRAFT  06/2002   x5, LIMA-LAD;VG- Diag; seq VG- ramus & OM branch; VG-PDA   CORONARY STENT INTERVENTION N/A 04/12/2017   Procedure: CORONARY STENT INTERVENTION;  Surgeon: Runell Gess, MD;  Location: [facility] INVASIVE CV LAB;  Service: Cardiovascular;  Laterality: N/A;  CORONARY STENT INTERVENTION N/A 02/08/2018   Procedure: CORONARY STENT INTERVENTION;  Surgeon: Corky Crafts, MD;  Location: [facility] INVASIVE CV LAB;  Service: Cardiovascular;  Laterality: N/A;   CORONARY STENT INTERVENTION N/A 11/12/2018   Procedure: CORONARY STENT INTERVENTION;  Surgeon: Iran Ouch, MD;  Location: [facility] INVASIVE CV LAB;  Service: Cardiovascular;  Laterality: N/A;   CORONARY STENT INTERVENTION N/A 06/22/2020   Procedure: CORONARY STENT INTERVENTION;  Surgeon: Corky Crafts, MD;  Location: [facility] INVASIVE CV LAB;  Service: Cardiovascular;  Laterality: N/A;   EP IMPLANTABLE DEVICE N/A 04/23/2015   Procedure: PPM Generator Changeout;  Surgeon: Thurmon Fair, MD;  Location: [facility] INVASIVE CV LAB;   Service: Cardiovascular;  Laterality: N/A;   FALSE ANEURYSM REPAIR Left 11/29/2018   Procedure: REPAIR FALSE ANEURYSM LEFT RADIAL ARTERY;  Surgeon: Larina Earthly, MD;  Location: [facility] OR;  Service: Vascular;  Laterality: Left;   FEMORAL ARTERY STENT Left ~ 2014   "taken out of my leg; couldn' catorgorize what kind so the put it under all 3"; cataroziepitheloid hemanioendotheliomau   FOOT FRACTURE SURGERY Left 1973   FRACTURE SURGERY     INSERT / REPLACE / REMOVE PACEMAKER  10/13/05   right side, medtronic Adapta   IR 3D INDEPENDENT WKST  02/26/2023   IR ANGIOGRAM PELVIS SELECTIVE OR SUPRASELECTIVE  02/26/2023   IR ANGIOGRAM SELECTIVE EACH ADDITIONAL VESSEL  02/26/2023   IR ANGIOGRAM SELECTIVE EACH ADDITIONAL VESSEL  02/26/2023   IR EMBO TUMOR ORGAN ISCHEMIA INFARCT INC GUIDE ROADMAPPING  02/26/2023   IR RADIOLOGIST EVAL & MGMT  02/02/2023   IR US GUIDE VASC ACCESS LEFT  02/26/2023   IR US GUIDE VASC ACCESS LEFT  02/26/2023   IR US GUIDE VASC ACCESS LEFT  02/26/2023   KNEE HARDWARE REMOVAL Right 1950's   "3-4 months after the insertion"   KNEE SURGERY Right 1950's   "broke my lower leg; had to put pin in my knee to keep lower leg in place til it healed"   LEFT HEART CATH AND CORS/GRAFTS ANGIOGRAPHY N/A 04/12/2017   Procedure: LEFT HEART CATH AND CORS/GRAFTS ANGIOGRAPHY;  Surgeon: Runell Gess, MD;  Location: [facility] INVASIVE CV LAB;  Service: Cardiovascular;  Laterality: N/A;   LEFT HEART CATH AND CORS/GRAFTS ANGIOGRAPHY N/A 02/06/2018   Procedure: LEFT HEART CATH AND CORS/GRAFTS ANGIOGRAPHY;  Surgeon: Lennette Bihari, MD;  Location: [facility] INVASIVE CV LAB;  Service: Cardiovascular;  Laterality: N/A;   LEFT HEART CATH AND CORS/GRAFTS ANGIOGRAPHY N/A 11/12/2018   Procedure: LEFT HEART CATH AND CORS/GRAFTS ANGIOGRAPHY;  Surgeon: Iran Ouch, MD;  Location: [facility] INVASIVE CV LAB;  Service: Cardiovascular;  Laterality: N/A;   LEFT HEART CATH AND CORS/GRAFTS ANGIOGRAPHY N/A 06/22/2020   Procedure: LEFT  HEART CATH AND CORS/GRAFTS ANGIOGRAPHY;  Surgeon: Corky Crafts, MD;  Location: [facility] INVASIVE CV LAB;  Service: Cardiovascular;  Laterality: N/A;   LEFT HEART CATH AND CORS/GRAFTS ANGIOGRAPHY N/A 12/01/2022   Procedure: LEFT HEART CATH AND CORS/GRAFTS ANGIOGRAPHY;  Surgeon: [place], Peter M, MD;  Location: [facility] INVASIVE CV LAB;  Service: Cardiovascular;  Laterality: N/A;   PERIPHERAL VASCULAR CATHETERIZATION N/A 11/25/2015   Procedure: Lower Extremity Angiography;  Surgeon: Runell Gess, MD;  Location: [facility] INVASIVE CV LAB;  Service: Cardiovascular;  Laterality: N/A;   PERIPHERAL VASCULAR CATHETERIZATION Left 11/25/2015   Procedure: Peripheral Vascular Intervention;  Surgeon: Runell Gess, MD;  Location: [facility] INVASIVE CV LAB;  Service: Cardiovascular;  Laterality: Left;  external iliac   PERIPHERAL VASCULAR CATHETERIZATION N/A  01/03/2016   Procedure: Lower Extremity Angiography;  Surgeon: Runell Gess, MD;  Location: [facility] INVASIVE CV LAB;  Service: Cardiovascular;  Laterality: N/A;   PERIPHERAL VASCULAR CATHETERIZATION Left 01/03/2016   Procedure: Peripheral Vascular Intervention;  Surgeon: Runell Gess, MD;  Location: [facility] INVASIVE CV LAB;  Service: Cardiovascular;  Laterality: Left;  SFA   PERIPHERAL VASCULAR CATHETERIZATION Right 02/14/2016   Procedure: Peripheral Vascular Atherectomy;  Surgeon: Runell Gess, MD;  Location: [facility] INVASIVE CV LAB;  Service: Cardiovascular;  Laterality: Right;  SFA   POPLITEAL ARTERY STENT  01/03/2016   Contralateral access with a 7 French crossover sheath (second order catheter placement)   TONSILLECTOMY AND ADENOIDECTOMY     TUMOR EXCISION Right ~ 2005   cancerous tumor removed from shoulder   TUMOR EXCISION Right ~ 2000   benign tumor removed from under shoulder   TUMOR EXCISION Left 06/02/2010   resection of Lt SFA wth interposition of Gore-Tex graft   Patient Active Problem List   Diagnosis Date Noted   Complicated UTI (urinary tract infection)  05/03/2023   Hx of CABG 12/20/2022   Urinary tract infection with hematuria 12/18/2022   Sepsis (HCC) 12/18/2022   Iron deficiency 12/18/2022   Renal insufficiency 12/18/2022   NSTEMI (non-ST elevated myocardial infarction) (HCC) 11/30/2022   Arthritis pain 11/30/2022   BPH (benign prostatic hyperplasia) 11/30/2022   Iron deficiency anemia due to chronic blood loss 11/15/2022   Acute encephalopathy 02/06/2022   Elevated troponin    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   Hematoma of right hip 01/10/2021   Primary osteoarthritis of right elbow 01/10/2021   Chronic anticoagulation 08/12/2019   Dyslipidemia (high LDL; low HDL) 03/05/2019   Unstable angina (HCC) 11/11/2018   Hypercholesterolemia 02/20/2018   PAF (paroxysmal atrial fibrillation) (HCC) 02/20/2018   CAD (coronary artery disease) of bypass graft 02/06/2018   CAD, multiple vessel 02/06/2018   Leukocytosis 02/16/2016   Transient hypotension 02/15/2016   Peripheral arterial disease (HCC)    Pacemaker 04/23/2015   Positive cardiac stress test    Chest pain    OSA on CPAP 11/04/2012   SSS (sick sinus syndrome), medtronic adapta    Hyperlipidemia due to type 2 diabetes mellitus (HCC)    Superficial femoral artery injury 05/09/2011   SPRAIN&STRAIN OTHER SPECIFIED SITES KNEE&LEG 01/31/2010   Type 2 diabetes mellitus with complication, without long-term current use of insulin (HCC) 11/26/2006   Essential hypertension 11/26/2006    PCP: Thana Ates, MD   REFERRING PROVIDER: Thana Ates, MD   REFERRING DIAG: Z91.81 History of falls  THERAPY DIAG:  Unsteadiness on feet  Balance disorder  Muscle weakness (generalized)  Cervicalgia  Other symptoms and signs involving the musculoskeletal system  Abnormal posture  Chronic tension-type headache, intractable  Dizziness and giddiness  Rationale for Evaluation and Treatment: Rehabilitation  ONSET DATE: October   SUBJECTIVE:   SUBJECTIVE  STATEMENT: Patient reports he is tight today; reports no falls since last visit. Patient states he feels no change in his balance. Patient states his blood sugar levels are "better" but still randomly fluctuate.  EVAL: Since October had a MI and have been in the hospital a lot (most recent 05/02/23) I've lost 10-15 pounds. He has had 2 infusions of iron. I've been fatigued a lot. My DM is kicking up. I'm wearing a monitor, but don't quite understand it. It climbs in a heartbeat to 300 and then in the morning it's down to 130. I eat oatmeal  and it shoots back up to 300. (Dr. Margaretann Loveless). He does not use the cane at home. Only when out. I'm losing the feeling in my feet. Gabapentin doesn't help. Wears a lift in the R shoe due to leg length discrepancy. Some left knee pain and wears brace.    PERTINENT HISTORY: CLL (leukemia), PAD, DM, CAD, PACEMAKER, on a catheter x 5 months PAIN:  Are you having pain? No - later reports he wears a brace for L knee pain.  PRECAUTIONS: Fall and ICD/Pacemaker  RED FLAGS: None   WEIGHT BEARING RESTRICTIONS: No  FALLS:  Has patient fallen in last 6 months? Yes. Number of falls 6 tripping  LIVING ENVIRONMENT: Lives with: lives with their spouse Lives in: House/apartment Stairs: Yes: Internal: 13 steps; on left going up and External: 1 steps; no rail Has following equipment at home: Single point cane, Walker - 2 wheeled, and Environmental consultant - 4 wheeled  OCCUPATION: retired  PLOF: Independent and daughter does most of the driving, but he does drive  PATIENT GOALS: to feel less clumsy  NEXT MD VISIT: a few months  OBJECTIVE:  Note: Objective measures were completed at Evaluation unless otherwise noted.  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   MUSCLE LENGTH: Tight B gastrocs  POSTURE:  rounded shoulders, forward head, and posterior pelvic tilt   LOWER EXTREMITY ROM: WFL for tasks assessed  Active ROM Right eval Left eval  Hip flexion    Hip extension    Hip abduction    Hip adduction    Hip internal rotation    Hip external rotation    Knee flexion    Knee extension    Ankle dorsiflexion    Ankle plantarflexion    Ankle inversion    Ankle eversion     (Blank rows = not tested)  LOWER EXTREMITY MMT: ABD/ADD in sitting  MMT Right eval Left eval  Hip flexion 4- 4  Hip extension    Hip abduction 5 5  Hip adduction 5 5  Hip internal rotation    Hip external rotation    Knee flexion    Knee extension 5 5  Ankle dorsiflexion 5 5  Ankle plantarflexion    Ankle inversion    Ankle eversion     (Blank rows = not tested)   FUNCTIONAL TESTS:  5 times sit to stand: 15.12 sec (one LOB and sat back down) Timed up and go (TUG): 10.21 sec Berg Balance Scale: 44/56 37-45 significant fall risk (>80%) mCTSIB: 120/120 (mod-min sway on C4)  GAIT: Distance walked: 20 Assistive device utilized: Single point cane Level of assistance: Modified independence Comments: Wide BOS, B hip ER   [facility] Adult PT Treatment:                                                DATE: 07/13/2023 Therapeutic Exercise: Supine: LTR x Bridges 2x10 Side Lying: Clamshell + 3#AW (holding on thigh) x10 Bent knee hip abd 2x10 Straight leg hip abd x10 Standing hip abd --> side step out/in + GTB Resisted side stepping + GTB around thighs & RTB around ankles  Neuromuscular re-ed: SLB with fingertip --> no UE support (  SBA) Quick weight shifting reactive strategy --> color dot call out with toe taps Therapeutic Activity: STS from corner of table x5 --> cradling 8#DB x8 Standing marching + 8#DB suitcase carry Curb navigation + [facility]   [facility] Adult PT Treatment:                                                  DATE: 07/10/2023 Therapeutic Exercise: Mat Table:  Hooklying hip add isometric  (ball squeeze) 10x5" Hooklying clamshells + blue TB 2x10 Bridges + hip abd isometric with blue - HOLD - pulls on catheter Side lying clamshells + BTB x15 (B) Standing: Resisted hip abd x10  B BTB at thighs  Resisted hip ext at 45 deg BTB x 10 B Resisted side stepping 4 steps x 6 B   BTB Neuromuscular re-ed: Single leg glute med strength pushing small ball into wall with contralateral limb 10 sec hold x 5  Standing in corner with chair in front: Romberg with EO/EC Staggered stance EO R/L + slow head turns/nods-  - pt reports more difficulty with left leg back Staggered stance EC R/L  + slow head turns/nods Therapeutic Activity: Squat to mat table + pad x 10   [facility] Adult PT Treatment:                                                DATE:  07/05/2023 Therapeutic Exercise: Mat Table:  Hooklying hip add isometric (ball squeeze) 10x5" Hooklying clamshells + blue TB x10 Bridges + hip abd isometric with blue TB x10 Side lying clamshells + RTB x15 (B) Standing: Resisted hip abd x10  Resisted side stepping  Neuromuscular re-ed: Standing in corner with chair in front: mREO + slow head turns/nods mREC + slow head turns/nods Staggered stance EO + slow head turns/nods Staggered stance EC Therapeutic Activity: mCTSIB (see above)                                                                                                                              PATIENT EDUCATION:  Education details: Updated HEP  Person educated: Patient Education method: Explanation, Demonstration, and Handouts Education comprehension: verbalized understanding and returned demonstration  HOME EXERCISE PROGRAM: Access Code: [email or web address] URL: [email or web address] Date: 07/13/2023 Prepared by: Carlynn Herald  Exercises - Sit to Stand  - 3 x daily - 7 x weekly - 1-2 sets - 10 reps - Supine Bridge  - 2 x daily - 7 x weekly - 1-3 sets - 10 reps - Clam with Resistance  - 1 x daily - 7 x weekly - 3 sets  - 10 reps - Standing Balance in Corner with Eyes Closed  - 1  x daily - 7 x weekly - 3 sets - 10 reps - 10 sec hold - Semi-Tandem Corner Balance: Eyes Open With Head Turns  - 1 x daily - 7 x weekly - 3 sets - 10 reps - Semi-Tandem Corner Balance With Eyes Closed  - 1 x daily - 7 x weekly - 3 sets - 10 reps - Standing Hip Abduction with Resistance at Ankles and Counter Support  - 1 x daily - 7 x weekly - 3 sets - 10 reps - Standing Hip Extension with Resistance at Ankles and Counter Support  - 1 x daily - 7 x weekly - 3 sets - 10 reps  ASSESSMENT:  CLINICAL IMPRESSION: Quick weight shifting and reactive strategy incorporated today; patient challenged with posterior toe tapping. Greater postural sway noted on L LE as compared to R with single leg balance. Curb training added due to history with falls.   EVAL: Patient is a 83 y.o. male who was seen today for physical therapy evaluation and treatment for unsteadiness and a h/o falls. He scored 44/56 on the BERG indicating he is a significant fall risk (>80%) and reports 6 falls over the past 6 months. He demonstrates LE functional weakness R>L and his significantly challenged with SLS activities. He also reports decreased sensation in B feet. He uses his cane in the community, but not at home. PT advised pt he should be using an AD at all times. He also reports he walks backwards down his basement steps using a hand rail. He will benefit from skilled PT to address these deficits.    OBJECTIVE IMPAIRMENTS: decreased balance, decreased strength, decreased safety awareness, impaired flexibility, impaired sensation, postural dysfunction, and pain.   ACTIVITY LIMITATIONS: stairs and locomotion level  PARTICIPATION LIMITATIONS: meal prep, cleaning, laundry, community activity, and yard work  PERSONAL FACTORS: Age, Past/current experiences, Time since onset of injury/illness/exacerbation, and 3+ comorbidities: leukemia, iron deficiency, DM, L knee pain  are  also affecting patient's functional outcome.   REHAB POTENTIAL: Good  CLINICAL DECISION MAKING: Unstable/unpredictable  EVALUATION COMPLEXITY: High   GOALS: Goals reviewed with patient? Yes  SHORT TERM GOALS: Target date: 07/31/2023   Patient will be independent with initial HEP. Baseline:  Goal status: INITIAL  2.  Patient will demonstrate decreased risk for falls by scoring 48/56 on the BERG. Baseline: 44 Goal status: INITIAL  3.  Patient will be educated on strategies to decrease risk of falls.  Baseline:  Goal status: INITIAL   LONG TERM GOALS: Target date: 08/28/2023  Patient will be independent with advanced/ongoing HEP to improve outcomes and carryover.  Baseline:  Goal status: INITIAL  2.  Patient will score 52 on Berg Balance test to demonstrate lower risk of falls. (MCID= 8 points) .  Baseline: 44 Goal status: INITIAL  3.  Patient will be able to step up/down curb safely with LRAD for safety with community ambulation.  Baseline:  Goal status: INITIAL   4.  Patient will demonstrate decreased 5XSTS by 2-3 seconds and no LOB showing improved functional strength. Baseline: 15.12 sec Goal status: INITIAL  5.  Patient able to climb stairs safely with one rail support.  Baseline: descends stairs backwards Goal status: INITIAL  6.  Patient will report no falls during therapy episode. Baseline:  Goal status: INITIAL   PLAN:  PT FREQUENCY: 2x/week  PT DURATION: 8 weeks  PLANNED INTERVENTIONS: 97164- PT Re-evaluation, 97110-Therapeutic exercises, 97530- Therapeutic activity, [id number]- Neuromuscular re-education, 97535- Self Care, [id number]- Manual therapy, [id number]- Gait  training, Patient/Family education, Balance training, Stair training, and DME instructions  PLAN FOR NEXT SESSION: Balance --> proprioception. Gluteus med focus L>R with general LE strength, stairs, encourage AD at all times right now. Be aware of blood sugar levels as pt says they have been  inconsistent.   Carlynn Herald, PTA 07/13/2023, 11:03 AM

## 2023-07-17 ENCOUNTER — Encounter: Payer: Self-pay | Admitting: Physical Therapy

## 2023-07-17 ENCOUNTER — Ambulatory Visit: Admitting: Physical Therapy

## 2023-07-17 DIAGNOSIS — R42 Dizziness and giddiness: Secondary | ICD-10-CM | POA: Diagnosis not present

## 2023-07-17 DIAGNOSIS — R2689 Other abnormalities of gait and mobility: Secondary | ICD-10-CM

## 2023-07-17 DIAGNOSIS — M6281 Muscle weakness (generalized): Secondary | ICD-10-CM

## 2023-07-17 DIAGNOSIS — R2681 Unsteadiness on feet: Secondary | ICD-10-CM

## 2023-07-17 DIAGNOSIS — R293 Abnormal posture: Secondary | ICD-10-CM | POA: Diagnosis not present

## 2023-07-17 DIAGNOSIS — R29898 Other symptoms and signs involving the musculoskeletal system: Secondary | ICD-10-CM | POA: Diagnosis not present

## 2023-07-17 DIAGNOSIS — M542 Cervicalgia: Secondary | ICD-10-CM | POA: Diagnosis not present

## 2023-07-17 DIAGNOSIS — G44221 Chronic tension-type headache, intractable: Secondary | ICD-10-CM | POA: Diagnosis not present

## 2023-07-17 NOTE — Therapy (Signed)
 OUTPATIENT PHYSICAL THERAPY LOWER EXTREMITY AND BALANCE TREATMENT   Patient Name: Robert Burns MRN: [id number] DOB:27-Mar-1941, 83 y.o., male Today's Date: 07/17/2023  END OF SESSION:  PT End of Session - 07/17/23 1151     Visit Number 5    Date for PT Re-Evaluation 08/28/23    Authorization Type [employer] MCR    Authorization Time Period 17 VISITS APPROVED FOR PT 07/03/2023-08/28/2023    Authorization - Visit Number 5    Authorization - Number of Visits 17    Progress Note Due on Visit 10    PT Start Time 1151    PT Stop Time 1231    PT Time Calculation (min) 40 min    Activity Tolerance Patient tolerated treatment well    Behavior During Therapy WFL for tasks assessed/performed            Past Medical History:  Diagnosis Date   Anemia    Anxiety    Arthritis    "all over" (11/25/2015)   CAD (coronary artery disease)    a. s/p CABG  06/2002; b. 02/01/15 PCI: DES to prox SVG to PDA, staged PCI of SVG to Diag in 02/2015; c. 04/2017 Cath/PCI: LM nl, LAD 100ost, 77m, 75d, LCX 60ost, OM2 80, RCA 100ost, RPDA 80, LIMA->LAD ok, VG->D1 patent stent, VG->RPDA patent stent, 50p, VG->OM1->OM2 90p (3.0x24 Synergy DES), 100 between OM1->OM2 (med rx).   Cancer [facility])    Right Shoulder, Left Leg- BCC, SCC, AND MELANOMA   Colon polyp    Elevated cholesterol    Epithelioid hemangioendothelioma    s/p resection of left SFA/mass with interposition 6 mm GoreTex graft 06/02/10, s/p repeat resection for positive margins 09/22/10   High blood pressure    Hyperlipidemia    Hypertension    Leg pain    OSA on CPAP    PAD (peripheral artery disease) (HCC)    a. 10/2002 L SFA PTA/BMS; b. 8/17 LE Angio: LEIA 90 (9x40 self exp stent), LSFA short segment prox occlusion (staged PTA/stenting 01/03/2016), patent mid stent, RSFA 79m (staged PTA/DEB 02/14/2016).   Presence of permanent cardiac pacemaker    Medtronic   Renal insufficiency 12/18/2022   SSS (sick sinus syndrome) (HCC)    a. s/p PPM in 2007 with  gen change 04/2015 - Medtronic Adapta ADDRL1, ser # [id number].   Type II diabetes mellitus (HCC)    Type II   Urgency of urination    Past Surgical History:  Procedure Laterality Date   CARDIAC CATHETERIZATION N/A 02/01/2015   Procedure: Left Heart Cath and Coronary Angiography;  Surgeon: Runell Gess, MD;  Location: [name] INVASIVE CV LAB;  Service: Cardiovascular;  Laterality: N/A;   CARDIAC CATHETERIZATION N/A 02/01/2015   Procedure: Coronary Stent Intervention;  Surgeon: Runell Gess, MD;  Location: [facility] INVASIVE CV LAB;  Service: Cardiovascular;  Laterality: N/A;   CARDIAC CATHETERIZATION  06/2002   "just before bypass OR"   CARDIAC CATHETERIZATION N/A 03/01/2015   Procedure: Coronary Stent Intervention;  Surgeon: Runell Gess, MD;  Location: [facility] INVASIVE CV LAB;  Service: Cardiovascular;  Laterality: N/A;   CARDIAC CATHETERIZATION  02/06/2018   COLONOSCOPY     CORONARY ANGIOPLASTY     CORONARY ARTERY BYPASS GRAFT  06/2002   x5, LIMA-LAD;VG- Diag; seq VG- ramus & OM branch; VG-PDA   CORONARY STENT INTERVENTION N/A 04/12/2017   Procedure: CORONARY STENT INTERVENTION;  Surgeon: Runell Gess, MD;  Location: [facility] INVASIVE CV LAB;  Service: Cardiovascular;  Laterality: N/A;  CORONARY STENT INTERVENTION N/A 02/08/2018   Procedure: CORONARY STENT INTERVENTION;  Surgeon: Corky Crafts, MD;  Location: [facility] INVASIVE CV LAB;  Service: Cardiovascular;  Laterality: N/A;   CORONARY STENT INTERVENTION N/A 11/12/2018   Procedure: CORONARY STENT INTERVENTION;  Surgeon: Iran Ouch, MD;  Location: [facility] INVASIVE CV LAB;  Service: Cardiovascular;  Laterality: N/A;   CORONARY STENT INTERVENTION N/A 06/22/2020   Procedure: CORONARY STENT INTERVENTION;  Surgeon: Corky Crafts, MD;  Location: [facility] INVASIVE CV LAB;  Service: Cardiovascular;  Laterality: N/A;   EP IMPLANTABLE DEVICE N/A 04/23/2015   Procedure: PPM Generator Changeout;  Surgeon: Thurmon Fair, MD;  Location: [facility] INVASIVE CV LAB;   Service: Cardiovascular;  Laterality: N/A;   FALSE ANEURYSM REPAIR Left 11/29/2018   Procedure: REPAIR FALSE ANEURYSM LEFT RADIAL ARTERY;  Surgeon: Larina Earthly, MD;  Location: [facility] OR;  Service: Vascular;  Laterality: Left;   FEMORAL ARTERY STENT Left ~ 2014   "taken out of my leg; couldn' catorgorize what kind so the put it under all 3"; cataroziepitheloid hemanioendotheliomau   FOOT FRACTURE SURGERY Left 1973   FRACTURE SURGERY     INSERT / REPLACE / REMOVE PACEMAKER  10/13/05   right side, medtronic Adapta   IR 3D INDEPENDENT WKST  02/26/2023   IR ANGIOGRAM PELVIS SELECTIVE OR SUPRASELECTIVE  02/26/2023   IR ANGIOGRAM SELECTIVE EACH ADDITIONAL VESSEL  02/26/2023   IR ANGIOGRAM SELECTIVE EACH ADDITIONAL VESSEL  02/26/2023   IR EMBO TUMOR ORGAN ISCHEMIA INFARCT INC GUIDE ROADMAPPING  02/26/2023   IR RADIOLOGIST EVAL & MGMT  02/02/2023   IR US GUIDE VASC ACCESS LEFT  02/26/2023   IR US GUIDE VASC ACCESS LEFT  02/26/2023   IR US GUIDE VASC ACCESS LEFT  02/26/2023   KNEE HARDWARE REMOVAL Right 1950's   "3-4 months after the insertion"   KNEE SURGERY Right 1950's   "broke my lower leg; had to put pin in my knee to keep lower leg in place til it healed"   LEFT HEART CATH AND CORS/GRAFTS ANGIOGRAPHY N/A 04/12/2017   Procedure: LEFT HEART CATH AND CORS/GRAFTS ANGIOGRAPHY;  Surgeon: Runell Gess, MD;  Location: [facility] INVASIVE CV LAB;  Service: Cardiovascular;  Laterality: N/A;   LEFT HEART CATH AND CORS/GRAFTS ANGIOGRAPHY N/A 02/06/2018   Procedure: LEFT HEART CATH AND CORS/GRAFTS ANGIOGRAPHY;  Surgeon: Lennette Bihari, MD;  Location: [facility] INVASIVE CV LAB;  Service: Cardiovascular;  Laterality: N/A;   LEFT HEART CATH AND CORS/GRAFTS ANGIOGRAPHY N/A 11/12/2018   Procedure: LEFT HEART CATH AND CORS/GRAFTS ANGIOGRAPHY;  Surgeon: Iran Ouch, MD;  Location: [facility] INVASIVE CV LAB;  Service: Cardiovascular;  Laterality: N/A;   LEFT HEART CATH AND CORS/GRAFTS ANGIOGRAPHY N/A 06/22/2020   Procedure: LEFT  HEART CATH AND CORS/GRAFTS ANGIOGRAPHY;  Surgeon: Corky Crafts, MD;  Location: [facility] INVASIVE CV LAB;  Service: Cardiovascular;  Laterality: N/A;   LEFT HEART CATH AND CORS/GRAFTS ANGIOGRAPHY N/A 12/01/2022   Procedure: LEFT HEART CATH AND CORS/GRAFTS ANGIOGRAPHY;  Surgeon: Swazil[place], Peter M, MD;  Location: [facility] INVASIVE CV LAB;  Service: Cardiovascular;  Laterality: N/A;   PERIPHERAL VASCULAR CATHETERIZATION N/A 11/25/2015   Procedure: Lower Extremity Angiography;  Surgeon: Runell Gess, MD;  Location: [facility] INVASIVE CV LAB;  Service: Cardiovascular;  Laterality: N/A;   PERIPHERAL VASCULAR CATHETERIZATION Left 11/25/2015   Procedure: Peripheral Vascular Intervention;  Surgeon: Runell Gess, MD;  Location: [facility] INVASIVE CV LAB;  Service: Cardiovascular;  Laterality: Left;  external iliac   PERIPHERAL VASCULAR CATHETERIZATION N/A  01/03/2016   Procedure: Lower Extremity Angiography;  Surgeon: Runell Gess, MD;  Location: [facility] INVASIVE CV LAB;  Service: Cardiovascular;  Laterality: N/A;   PERIPHERAL VASCULAR CATHETERIZATION Left 01/03/2016   Procedure: Peripheral Vascular Intervention;  Surgeon: Runell Gess, MD;  Location: [facility] INVASIVE CV LAB;  Service: Cardiovascular;  Laterality: Left;  SFA   PERIPHERAL VASCULAR CATHETERIZATION Right 02/14/2016   Procedure: Peripheral Vascular Atherectomy;  Surgeon: Runell Gess, MD;  Location: [facility] INVASIVE CV LAB;  Service: Cardiovascular;  Laterality: Right;  SFA   POPLITEAL ARTERY STENT  01/03/2016   Contralateral access with a 7 French crossover sheath (second order catheter placement)   TONSILLECTOMY AND ADENOIDECTOMY     TUMOR EXCISION Right ~ 2005   cancerous tumor removed from shoulder   TUMOR EXCISION Right ~ 2000   benign tumor removed from under shoulder   TUMOR EXCISION Left 06/02/2010   resection of Lt SFA wth interposition of Gore-Tex graft   Patient Active Problem List   Diagnosis Date Noted   Complicated UTI (urinary tract infection)  05/03/2023   Hx of CABG 12/20/2022   Urinary tract infection with hematuria 12/18/2022   Sepsis (HCC) 12/18/2022   Iron deficiency 12/18/2022   Renal insufficiency 12/18/2022   NSTEMI (non-ST elevated myocardial infarction) (HCC) 11/30/2022   Arthritis pain 11/30/2022   BPH (benign prostatic hyperplasia) 11/30/2022   Iron deficiency anemia due to chronic blood loss 11/15/2022   Acute encephalopathy 02/06/2022   Elevated troponin    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   Hematoma of right hip 01/10/2021   Primary osteoarthritis of right elbow 01/10/2021   Chronic anticoagulation 08/12/2019   Dyslipidemia (high LDL; low HDL) 03/05/2019   Unstable angina (HCC) 11/11/2018   Hypercholesterolemia 02/20/2018   PAF (paroxysmal atrial fibrillation) (HCC) 02/20/2018   CAD (coronary artery disease) of bypass graft 02/06/2018   CAD, multiple vessel 02/06/2018   Leukocytosis 02/16/2016   Transient hypotension 02/15/2016   Peripheral arterial disease (HCC)    Pacemaker 04/23/2015   Positive cardiac stress test    Chest pain    OSA on CPAP 11/04/2012   SSS (sick sinus syndrome), medtronic adapta    Hyperlipidemia due to type 2 diabetes mellitus (HCC)    Superficial femoral artery injury 05/09/2011   SPRAIN&STRAIN OTHER SPECIFIED SITES KNEE&LEG 01/31/2010   Type 2 diabetes mellitus with complication, without long-term current use of insulin (HCC) 11/26/2006   Essential hypertension 11/26/2006    PCP: Thana Ates, MD   REFERRING PROVIDER: Thana Ates, MD   REFERRING DIAG: Z91.81 History of falls  THERAPY DIAG:  Unsteadiness on feet  Balance disorder  Muscle weakness (generalized)  Rationale for Evaluation and Treatment: Rehabilitation  ONSET DATE: October   SUBJECTIVE:   SUBJECTIVE STATEMENT: I don't feel as tight. My knees and shoulders are a little painful today. Pt reports no falls since last visit.    EVAL: Since October had a MI and have been in  the hospital a lot (most recent 05/02/23) I've lost 10-15 pounds. He has had 2 infusions of iron. I've been fatigued a lot. My DM is kicking up. I'm wearing a monitor, but don't quite understand it. It climbs in a heartbeat to 300 and then in the morning it's down to 130. I eat oatmeal and it shoots back up to 300. (Dr. Margaretann Loveless). He does not use the cane at home. Only when out. I'm losing the feeling in my feet. Gabapentin doesn't help. Wears a lift  in the R shoe due to leg length discrepancy. Some left knee pain and wears brace.    PERTINENT HISTORY: CLL (leukemia), PAD, DM, CAD, PACEMAKER, on a catheter x 5 months PAIN:  Are you having pain? No - later reports he wears a brace for L knee pain.  PRECAUTIONS: Fall and ICD/Pacemaker  RED FLAGS: None   WEIGHT BEARING RESTRICTIONS: No  FALLS:  Has patient fallen in last 6 months? Yes. Number of falls 6 tripping  LIVING ENVIRONMENT: Lives with: lives with their spouse Lives in: House/apartment Stairs: Yes: Internal: 13 steps; on left going up and External: 1 steps; no rail Has following equipment at home: Single point cane, Walker - 2 wheeled, and Environmental consultant - 4 wheeled  OCCUPATION: retired  PLOF: Independent and daughter does most of the driving, but he does drive  PATIENT GOALS: to feel less clumsy  NEXT MD VISIT: a few months  OBJECTIVE:  Note: Objective measures were completed at Evaluation unless otherwise noted.  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   MUSCLE LENGTH: Tight B gastrocs  POSTURE: rounded shoulders, forward head, and posterior pelvic tilt   LOWER EXTREMITY ROM: WFL for tasks assessed  Active ROM Right eval Left eval  Hip flexion    Hip extension    Hip abduction    Hip adduction    Hip internal rotation    Hip external  rotation    Knee flexion    Knee extension    Ankle dorsiflexion    Ankle plantarflexion    Ankle inversion    Ankle eversion     (Blank rows = not tested)  LOWER EXTREMITY MMT: ABD/ADD in sitting  MMT Right eval Left eval  Hip flexion 4- 4  Hip extension    Hip abduction 5 5  Hip adduction 5 5  Hip internal rotation    Hip external rotation    Knee flexion    Knee extension 5 5  Ankle dorsiflexion 5 5  Ankle plantarflexion    Ankle inversion    Ankle eversion     (Blank rows = not tested)   FUNCTIONAL TESTS:  5 times sit to stand: 15.12 sec (one LOB and sat back down) Timed up and go (TUG): 10.21 sec Berg Balance Scale: 44/56 37-45 significant fall risk (>80%) mCTSIB: 120/120 (mod-min sway on C4)  GAIT: Distance walked: 20 Assistive device utilized: Single point cane Level of assistance: Modified independence Comments: Wide BOS, B hip ER   [facility] Adult PT Treatment:                                                DATE: 07/17/2023 Therapeutic Exercise: Supine: LTR x Bridges 2x10 Side Lying: Clamshell + 3#AW (holding on thigh) x10 Bent knee hip abd 2x10 Straight leg hip abd x10 Standing hip abd --> side step out/in + GTB Resisted side stepping + GTB around thighs & RTB around ankles  Neuromuscular re-ed: SLB with fingertip --> no UE support (SBA) Quick weight shifting reactive strategy -->  SLS tapping front and back to dots Toe taps to 4 and 6 inch step x 10 ea B Therapeutic Activity: STS from corner of  table x --> cradling 8#DB x 10 Standing marching + 8#DB suitcase carry 2x 10 Curb navigation + SPC -  obstacles and 6 inch step/curb  [facility] Adult PT Treatment:                                                DATE: 07/13/2023 Therapeutic Exercise: Supine: LTR x Bridges 2x10 Side Lying: Clamshell + 3#AW (holding on thigh) x10 Bent knee hip abd 2x10 Straight leg hip abd x10 Standing hip abd --> side step out/in + GTB x 20 B Resisted side stepping +  GTB around thighs & RTB around ankles  Neuromuscular re-ed: SLB with fingertip --> no UE support (SBA) Quick weight shifting reactive strategy --> color dot call out with toe taps Therapeutic Activity: STS from corner of table x5 --> cradling 8#DB x8 Standing marching + 8#DB suitcase carry Curb navigation + [facility]   [facility] Adult PT Treatment:                                                  DATE: 07/10/2023 Therapeutic Exercise: Mat Table:  Hooklying hip add isometric (ball squeeze) 10x5" Hooklying clamshells + blue TB 2x10 Bridges + hip abd isometric with blue - HOLD - pulls on catheter Side lying clamshells + BTB x15 (B) Standing: Resisted hip abd x10  B BTB at thighs  Resisted hip ext at 45 deg BTB x 10 B Resisted side stepping 4 steps x 6 B   BTB Neuromuscular re-ed: Single leg glute med strength pushing small ball into wall with contralateral limb 10 sec hold x 5  Standing in corner with chair in front: Romberg with EO/EC Staggered stance EO R/L + slow head turns/nods-  - pt reports more difficulty with left leg back Staggered stance EC R/L  + slow head turns/nods Therapeutic Activity: Squat to mat table + pad x 10   [facility] Adult PT Treatment:                                                DATE:  07/05/2023 Therapeutic Exercise: Mat Table:  Hooklying hip add isometric (ball squeeze) 10x5" Hooklying clamshells + blue TB x10 Bridges + hip abd isometric with blue TB x10 Side lying clamshells + RTB x15 (B) Standing: Resisted hip abd x10  Resisted side stepping  Neuromuscular re-ed: Standing in corner with chair in front: mREO + slow head turns/nods mREC + slow head turns/nods Staggered stance EO + slow head turns/nods Staggered stance EC Therapeutic Activity: mCTSIB (see above)  PATIENT EDUCATION:  Education details: Updated HEP  Person educated:  Patient Education method: Explanation, Demonstration, and Handouts Education comprehension: verbalized understanding and returned demonstration  HOME EXERCISE PROGRAM: Access Code: [email or web address] URL: [email or web address] Date: 07/13/2023 Prepared by: Carlynn Herald  Exercises - Sit to Stand  - 3 x daily - 7 x weekly - 1-2 sets - 10 reps - Supine Bridge  - 2 x daily - 7 x weekly - 1-3 sets - 10 reps - Clam with Resistance  - 1 x daily - 7 x weekly - 3 sets - 10 reps - Standing Balance in Corner with Eyes Closed  - 1 x daily - 7 x weekly - 3 sets - 10 reps - 10 sec hold - Semi-Tandem Corner Balance: Eyes Open With Head Turns  - 1 x daily - 7 x weekly - 3 sets - 10 reps - Semi-Tandem Corner Balance With Eyes Closed  - 1 x daily - 7 x weekly - 3 sets - 10 reps - Standing Hip Abduction with Resistance at Ankles and Counter Support  - 1 x daily - 7 x weekly - 3 sets - 10 reps - Standing Hip Extension with Resistance at Ankles and Counter Support  - 1 x daily - 7 x weekly - 3 sets - 10 reps  ASSESSMENT:  CLINICAL IMPRESSION: Continued to work on balance with Quick weight shifting today. Still more challenged with posterior toe tapping and more so standing on L LE. Improves with reps. One LOB with resisted step out today.  Ongoing curb training with obstacles and SPC. Good gait pattern with cane. Weighted sit to stands are challenging as well.   EVAL: Patient is a 83 y.o. male who was seen today for physical therapy evaluation and treatment for unsteadiness and a h/o falls. He scored 44/56 on the BERG indicating he is a significant fall risk (>80%) and reports 6 falls over the past 6 months. He demonstrates LE functional weakness R>L and his significantly challenged with SLS activities. He also reports decreased sensation in B feet. He uses his cane in the community, but not at home. PT advised pt he should be using an AD at all times. He also reports he walks backwards down his basement  steps using a hand rail. He will benefit from skilled PT to address these deficits.    OBJECTIVE IMPAIRMENTS: decreased balance, decreased strength, decreased safety awareness, impaired flexibility, impaired sensation, postural dysfunction, and pain.   ACTIVITY LIMITATIONS: stairs and locomotion level  PARTICIPATION LIMITATIONS: meal prep, cleaning, laundry, community activity, and yard work  PERSONAL FACTORS: Age, Past/current experiences, Time since onset of injury/illness/exacerbation, and 3+ comorbidities: leukemia, iron deficiency, DM, L knee pain  are also affecting patient's functional outcome.   REHAB POTENTIAL: Good  CLINICAL DECISION MAKING: Unstable/unpredictable  EVALUATION COMPLEXITY: High   GOALS: Goals reviewed with patient? Yes  SHORT TERM GOALS: Target date: 07/31/2023   Patient will be independent with initial HEP. Baseline:  Goal status: INITIAL  2.  Patient will demonstrate decreased risk for falls by scoring 48/56 on the BERG. Baseline: 44 Goal status: INITIAL  3.  Patient will be educated on strategies to decrease risk of falls.  Baseline:  Goal status: INITIAL   LONG TERM GOALS: Target date: 08/28/2023  Patient will be independent with advanced/ongoing HEP to improve outcomes and carryover.  Baseline:  Goal status: INITIAL  2.  Patient will score 52 on Berg Balance test to demonstrate lower risk of falls. (MCID= 8 points) .  Baseline: 44 Goal status: INITIAL  3.  Patient will be able to step up/down curb safely with LRAD for safety with community ambulation.  Baseline:  Goal status: INITIAL   4.  Patient will demonstrate decreased 5XSTS by 2-3 seconds and no LOB showing improved functional strength. Baseline: 15.12 sec Goal status: INITIAL  5.  Patient able to climb stairs safely with one rail support.  Baseline: descends stairs backwards Goal status: INITIAL  6.  Patient will report no falls during therapy episode. Baseline:  Goal  status: INITIAL   PLAN:  PT FREQUENCY: 2x/week  PT DURATION: 8 weeks  PLANNED INTERVENTIONS: [id number]- PT Re-evaluation, 97110-Therapeutic exercises, 97530- Therapeutic activity, [id number]- Neuromuscular re-education, 97535- Self Care, [id number]- Manual therapy, [phone]- Gait training, Patient/Family education, Balance training, Stair training, and DME instructions  PLAN FOR NEXT SESSION: Balance --> proprioception. Gluteus med focus L>R with general LE strength, stairs, encourage AD at all times right now. Be aware of blood sugar levels as pt says they have been inconsistent.   Solon Palm, PT  07/17/2023, 12:39 PM

## 2023-07-19 ENCOUNTER — Inpatient Hospital Stay: Payer: Medicare HMO | Attending: Hematology and Oncology

## 2023-07-19 DIAGNOSIS — D649 Anemia, unspecified: Secondary | ICD-10-CM | POA: Insufficient documentation

## 2023-07-19 DIAGNOSIS — C9110 Chronic lymphocytic leukemia of B-cell type not having achieved remission: Secondary | ICD-10-CM | POA: Insufficient documentation

## 2023-07-20 ENCOUNTER — Ambulatory Visit

## 2023-07-20 DIAGNOSIS — R2689 Other abnormalities of gait and mobility: Secondary | ICD-10-CM

## 2023-07-20 DIAGNOSIS — R2681 Unsteadiness on feet: Secondary | ICD-10-CM | POA: Diagnosis not present

## 2023-07-20 DIAGNOSIS — G44221 Chronic tension-type headache, intractable: Secondary | ICD-10-CM | POA: Diagnosis not present

## 2023-07-20 DIAGNOSIS — M6281 Muscle weakness (generalized): Secondary | ICD-10-CM | POA: Diagnosis not present

## 2023-07-20 DIAGNOSIS — R42 Dizziness and giddiness: Secondary | ICD-10-CM | POA: Diagnosis not present

## 2023-07-20 DIAGNOSIS — R293 Abnormal posture: Secondary | ICD-10-CM | POA: Diagnosis not present

## 2023-07-20 DIAGNOSIS — R29898 Other symptoms and signs involving the musculoskeletal system: Secondary | ICD-10-CM | POA: Diagnosis not present

## 2023-07-20 DIAGNOSIS — M542 Cervicalgia: Secondary | ICD-10-CM | POA: Diagnosis not present

## 2023-07-20 NOTE — Therapy (Signed)
 OUTPATIENT PHYSICAL THERAPY LOWER EXTREMITY AND BALANCE TREATMENT   Patient Name: Robert Burns MRN: [id number] DOB:March 01, 1941, 83 y.o., male Today's Date: 07/20/2023  END OF SESSION:  PT End of Session - 07/20/23 1102     Visit Number 6    Date for PT Re-Evaluation 08/28/23    Authorization Type [employer] MCR    Authorization Time Period 17 VISITS APPROVED FOR PT 07/03/2023-08/28/2023    Authorization - Visit Number 6    Authorization - Number of Visits 17    Progress Note Due on Visit 10    PT Start Time 1102    PT Stop Time 1150    PT Time Calculation (min) 48 min    Activity Tolerance Patient tolerated treatment well    Behavior During Therapy WFL for tasks assessed/performed            Past Medical History:  Diagnosis Date   Anemia    Anxiety    Arthritis    "all over" (11/25/2015)   CAD (coronary artery disease)    a. s/p CABG  06/2002; b. 02/01/15 PCI: DES to prox SVG to PDA, staged PCI of SVG to Diag in 02/2015; c. 04/2017 Cath/PCI: LM nl, LAD 100ost, 31m, 75d, LCX 60ost, OM2 80, RCA 100ost, RPDA 80, LIMA->LAD ok, VG->D1 patent stent, VG->RPDA patent stent, 50p, VG->OM1->OM2 90p (3.0x24 Synergy DES), 100 between OM1->OM2 (med rx).   Cancer [facility])    Right Shoulder, Left Leg- BCC, SCC, AND MELANOMA   Colon polyp    Elevated cholesterol    Epithelioid hemangioendothelioma    s/p resection of left SFA/mass with interposition 6 mm GoreTex graft 06/02/10, s/p repeat resection for positive margins 09/22/10   High blood pressure    Hyperlipidemia    Hypertension    Leg pain    OSA on CPAP    PAD (peripheral artery disease) (HCC)    a. 10/2002 L SFA PTA/BMS; b. 8/17 LE Angio: LEIA 90 (9x40 self exp stent), LSFA short segment prox occlusion (staged PTA/stenting 01/03/2016), patent mid stent, RSFA 52m (staged PTA/DEB 02/14/2016).   Presence of permanent cardiac pacemaker    Medtronic   Renal insufficiency 12/18/2022   SSS (sick sinus syndrome) (HCC)    a. s/p PPM in 2007 with  gen change 04/2015 - Medtronic Adapta ADDRL1, ser # [id number].   Type II diabetes mellitus (HCC)    Type II   Urgency of urination    Past Surgical History:  Procedure Laterality Date   CARDIAC CATHETERIZATION N/A 02/01/2015   Procedure: Left Heart Cath and Coronary Angiography;  Surgeon: Runell Gess, MD;  Location: [facility] INVASIVE CV LAB;  Service: Cardiovascular;  Laterality: N/A;   CARDIAC CATHETERIZATION N/A 02/01/2015   Procedure: Coronary Stent Intervention;  Surgeon: Runell Gess, MD;  Location: [facility] INVASIVE CV LAB;  Service: Cardiovascular;  Laterality: N/A;   CARDIAC CATHETERIZATION  06/2002   "just before bypass OR"   CARDIAC CATHETERIZATION N/A 03/01/2015   Procedure: Coronary Stent Intervention;  Surgeon: Runell Gess, MD;  Location: [facility] INVASIVE CV LAB;  Service: Cardiovascular;  Laterality: N/A;   CARDIAC CATHETERIZATION  02/06/2018   COLONOSCOPY     CORONARY ANGIOPLASTY     CORONARY ARTERY BYPASS GRAFT  06/2002   x5, LIMA-LAD;VG- Diag; seq VG- ramus & OM branch; VG-PDA   CORONARY STENT INTERVENTION N/A 04/12/2017   Procedure: CORONARY STENT INTERVENTION;  Surgeon: Runell Gess, MD;  Location: [facility] INVASIVE CV LAB;  Service: Cardiovascular;  Laterality: N/A;  CORONARY STENT INTERVENTION N/A 02/08/2018   Procedure: CORONARY STENT INTERVENTION;  Surgeon: Corky Crafts, MD;  Location: [facility] INVASIVE CV LAB;  Service: Cardiovascular;  Laterality: N/A;   CORONARY STENT INTERVENTION N/A 11/12/2018   Procedure: CORONARY STENT INTERVENTION;  Surgeon: Iran Ouch, MD;  Location: [facility] INVASIVE CV LAB;  Service: Cardiovascular;  Laterality: N/A;   CORONARY STENT INTERVENTION N/A 06/22/2020   Procedure: CORONARY STENT INTERVENTION;  Surgeon: Corky Crafts, MD;  Location: [facility] INVASIVE CV LAB;  Service: Cardiovascular;  Laterality: N/A;   EP IMPLANTABLE DEVICE N/A 04/23/2015   Procedure: PPM Generator Changeout;  Surgeon: Thurmon Fair, MD;  Location: [facility] INVASIVE CV LAB;   Service: Cardiovascular;  Laterality: N/A;   FALSE ANEURYSM REPAIR Left 11/29/2018   Procedure: REPAIR FALSE ANEURYSM LEFT RADIAL ARTERY;  Surgeon: Larina Earthly, MD;  Location: [facility] OR;  Service: Vascular;  Laterality: Left;   FEMORAL ARTERY STENT Left ~ 2014   "taken out of my leg; couldn' catorgorize what kind so the put it under all 3"; cataroziepitheloid hemanioendotheliomau   FOOT FRACTURE SURGERY Left 1973   FRACTURE SURGERY     INSERT / REPLACE / REMOVE PACEMAKER  10/13/05   right side, medtronic Adapta   IR 3D INDEPENDENT WKST  02/26/2023   IR ANGIOGRAM PELVIS SELECTIVE OR SUPRASELECTIVE  02/26/2023   IR ANGIOGRAM SELECTIVE EACH ADDITIONAL VESSEL  02/26/2023   IR ANGIOGRAM SELECTIVE EACH ADDITIONAL VESSEL  02/26/2023   IR EMBO TUMOR ORGAN ISCHEMIA INFARCT INC GUIDE ROADMAPPING  02/26/2023   IR RADIOLOGIST EVAL & MGMT  02/02/2023   IR US GUIDE VASC ACCESS LEFT  02/26/2023   IR US GUIDE VASC ACCESS LEFT  02/26/2023   IR US GUIDE VASC ACCESS LEFT  02/26/2023   KNEE HARDWARE REMOVAL Right 1950's   "3-4 months after the insertion"   KNEE SURGERY Right 1950's   "broke my lower leg; had to put pin in my knee to keep lower leg in place til it healed"   LEFT HEART CATH AND CORS/GRAFTS ANGIOGRAPHY N/A 04/12/2017   Procedure: LEFT HEART CATH AND CORS/GRAFTS ANGIOGRAPHY;  Surgeon: Runell Gess, MD;  Location: [facility] INVASIVE CV LAB;  Service: Cardiovascular;  Laterality: N/A;   LEFT HEART CATH AND CORS/GRAFTS ANGIOGRAPHY N/A 02/06/2018   Procedure: LEFT HEART CATH AND CORS/GRAFTS ANGIOGRAPHY;  Surgeon: Lennette Bihari, MD;  Location: [facility] INVASIVE CV LAB;  Service: Cardiovascular;  Laterality: N/A;   LEFT HEART CATH AND CORS/GRAFTS ANGIOGRAPHY N/A 11/12/2018   Procedure: LEFT HEART CATH AND CORS/GRAFTS ANGIOGRAPHY;  Surgeon: Iran Ouch, MD;  Location: [facility] INVASIVE CV LAB;  Service: Cardiovascular;  Laterality: N/A;   LEFT HEART CATH AND CORS/GRAFTS ANGIOGRAPHY N/A 06/22/2020   Procedure: LEFT  HEART CATH AND CORS/GRAFTS ANGIOGRAPHY;  Surgeon: Corky Crafts, MD;  Location: [facility] INVASIVE CV LAB;  Service: Cardiovascular;  Laterality: N/A;   LEFT HEART CATH AND CORS/GRAFTS ANGIOGRAPHY N/A 12/01/2022   Procedure: LEFT HEART CATH AND CORS/GRAFTS ANGIOGRAPHY;  Surgeon: [place], Peter M, MD;  Location: [facility] INVASIVE CV LAB;  Service: Cardiovascular;  Laterality: N/A;   PERIPHERAL VASCULAR CATHETERIZATION N/A 11/25/2015   Procedure: Lower Extremity Angiography;  Surgeon: Runell Gess, MD;  Location: [facility] INVASIVE CV LAB;  Service: Cardiovascular;  Laterality: N/A;   PERIPHERAL VASCULAR CATHETERIZATION Left 11/25/2015   Procedure: Peripheral Vascular Intervention;  Surgeon: Runell Gess, MD;  Location: [facility] INVASIVE CV LAB;  Service: Cardiovascular;  Laterality: Left;  external iliac   PERIPHERAL VASCULAR CATHETERIZATION N/A  01/03/2016   Procedure: Lower Extremity Angiography;  Surgeon: Runell Gess, MD;  Location: [facility] INVASIVE CV LAB;  Service: Cardiovascular;  Laterality: N/A;   PERIPHERAL VASCULAR CATHETERIZATION Left 01/03/2016   Procedure: Peripheral Vascular Intervention;  Surgeon: Runell Gess, MD;  Location: [facility] INVASIVE CV LAB;  Service: Cardiovascular;  Laterality: Left;  SFA   PERIPHERAL VASCULAR CATHETERIZATION Right 02/14/2016   Procedure: Peripheral Vascular Atherectomy;  Surgeon: Runell Gess, MD;  Location: [facility] INVASIVE CV LAB;  Service: Cardiovascular;  Laterality: Right;  SFA   POPLITEAL ARTERY STENT  01/03/2016   Contralateral access with a 7 French crossover sheath (second order catheter placement)   TONSILLECTOMY AND ADENOIDECTOMY     TUMOR EXCISION Right ~ 2005   cancerous tumor removed from shoulder   TUMOR EXCISION Right ~ 2000   benign tumor removed from under shoulder   TUMOR EXCISION Left 06/02/2010   resection of Lt SFA wth interposition of Gore-Tex graft   Patient Active Problem List   Diagnosis Date Noted   Complicated UTI (urinary tract infection)  05/03/2023   Hx of CABG 12/20/2022   Urinary tract infection with hematuria 12/18/2022   Sepsis (HCC) 12/18/2022   Iron deficiency 12/18/2022   Renal insufficiency 12/18/2022   NSTEMI (non-ST elevated myocardial infarction) (HCC) 11/30/2022   Arthritis pain 11/30/2022   BPH (benign prostatic hyperplasia) 11/30/2022   Iron deficiency anemia due to chronic blood loss 11/15/2022   Acute encephalopathy 02/06/2022   Elevated troponin    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   Hematoma of right hip 01/10/2021   Primary osteoarthritis of right elbow 01/10/2021   Chronic anticoagulation 08/12/2019   Dyslipidemia (high LDL; low HDL) 03/05/2019   Unstable angina (HCC) 11/11/2018   Hypercholesterolemia 02/20/2018   PAF (paroxysmal atrial fibrillation) (HCC) 02/20/2018   CAD (coronary artery disease) of bypass graft 02/06/2018   CAD, multiple vessel 02/06/2018   Leukocytosis 02/16/2016   Transient hypotension 02/15/2016   Peripheral arterial disease (HCC)    Pacemaker 04/23/2015   Positive cardiac stress test    Chest pain    OSA on CPAP 11/04/2012   SSS (sick sinus syndrome), medtronic adapta    Hyperlipidemia due to type 2 diabetes mellitus (HCC)    Superficial femoral artery injury 05/09/2011   SPRAIN&STRAIN OTHER SPECIFIED SITES KNEE&LEG 01/31/2010   Type 2 diabetes mellitus with complication, without long-term current use of insulin (HCC) 11/26/2006   Essential hypertension 11/26/2006    PCP: Thana Ates, MD   REFERRING PROVIDER: Thana Ates, MD   REFERRING DIAG: Z91.81 History of falls  THERAPY DIAG:  Unsteadiness on feet  Balance disorder  Muscle weakness (generalized)  Rationale for Evaluation and Treatment: Rehabilitation  ONSET DATE: October   SUBJECTIVE:   SUBJECTIVE STATEMENT: Patient reports L knee feels sore but shoulder is better. Patient states his blood sugar continues to fluctuate.   EVAL: Since October had a MI and have been in  the hospital a lot (most recent 05/02/23) I've lost 10-15 pounds. He has had 2 infusions of iron. I've been fatigued a lot. My DM is kicking up. I'm wearing a monitor, but don't quite understand it. It climbs in a heartbeat to 300 and then in the morning it's down to 130. I eat oatmeal and it shoots back up to 300. (Dr. Margaretann Loveless). He does not use the cane at home. Only when out. I'm losing the feeling in my feet. Gabapentin doesn't help. Wears a lift in the R shoe  due to leg length discrepancy. Some left knee pain and wears brace.    PERTINENT HISTORY: CLL (leukemia), PAD, DM, CAD, PACEMAKER, on a catheter x 5 months PAIN:  Are you having pain? No - later reports he wears a brace for L knee pain.  PRECAUTIONS: Fall and ICD/Pacemaker  RED FLAGS: None   WEIGHT BEARING RESTRICTIONS: No  FALLS:  Has patient fallen in last 6 months? Yes. Number of falls 6 tripping  LIVING ENVIRONMENT: Lives with: lives with their spouse Lives in: House/apartment Stairs: Yes: Internal: 13 steps; on left going up and External: 1 steps; no rail Has following equipment at home: Single point cane, Walker - 2 wheeled, and Environmental consultant - 4 wheeled  OCCUPATION: retired  PLOF: Independent and daughter does most of the driving, but he does drive  PATIENT GOALS: to feel less clumsy  NEXT MD VISIT: a few months  OBJECTIVE:  Note: Objective measures were completed at Evaluation unless otherwise noted.  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   MUSCLE LENGTH: Tight B gastrocs  POSTURE: rounded shoulders, forward head, and posterior pelvic tilt   LOWER EXTREMITY ROM: WFL for tasks assessed  Active ROM Right eval Left eval  Hip flexion    Hip extension    Hip abduction    Hip adduction    Hip internal rotation    Hip external  rotation    Knee flexion    Knee extension    Ankle dorsiflexion    Ankle plantarflexion    Ankle inversion    Ankle eversion     (Blank rows = not tested)  LOWER EXTREMITY MMT: ABD/ADD in sitting  MMT Right eval Left eval  Hip flexion 4- 4  Hip extension    Hip abduction 5 5  Hip adduction 5 5  Hip internal rotation    Hip external rotation    Knee flexion    Knee extension 5 5  Ankle dorsiflexion 5 5  Ankle plantarflexion    Ankle inversion    Ankle eversion     (Blank rows = not tested)   FUNCTIONAL TESTS:  5 times sit to stand: 15.12 sec (one LOB and sat back down) Timed up and go (TUG): 10.21 sec Berg Balance Scale: 44/56 37-45 significant fall risk (>80%) mCTSIB: 120/120 (mod-min sway on C4)  GAIT: Distance walked: 20 Assistive device utilized: Single point cane Level of assistance: Modified independence Comments: Wide BOS, B hip ER   [facility] Adult PT Treatment:                                                DATE: 07/20/2023 Therapeutic Exercise: Standing at counter: Heel raises x20 Toe raises x20 Squats x10 --> added GTB above knees x10 HS curls + 3#AW x15, x10 from toe tap position Hip abduction + 3#AW x20 (B)  Hip extension + 3#AW x15 (B) Neuromuscular re-ed: Standing in corner with chair in front: Airex --> REO + head turns/nods Airex --> REC static hold Solid ground --> progressing staggered stance to tandem stance, EO & slow head turns Therapeutic Activity: Reaching beyond BOS --> across midline Standing in corner with chair in front --> reaching beyond  BOS, reaching across midline, reaching high/low    [facility] Adult PT Treatment:                                                DATE: 07/17/2023 Therapeutic Exercise: Supine: LTR x Bridges 2x10 Side Lying: Clamshell + 3#AW (holding on thigh) x10 Bent knee hip abd 2x10 Straight leg hip abd x10 Standing hip abd --> side step out/in + GTB Resisted side stepping + GTB around thighs & RTB around  ankles  Neuromuscular re-ed: SLB with fingertip --> no UE support (SBA) Quick weight shifting reactive strategy -->  SLS tapping front and back to dots Toe taps to 4 and 6 inch step x 10 ea B Therapeutic Activity: STS from corner of table x --> cradling 8#DB x 10 Standing marching + 8#DB suitcase carry 2x 10 Curb navigation + SPC -  obstacles and 6 inch step/curb  [facility] Adult PT Treatment:                                                DATE: 07/13/2023 Therapeutic Exercise: Supine: LTR x Bridges 2x10 Side Lying: Clamshell + 3#AW (holding on thigh) x10 Bent knee hip abd 2x10 Straight leg hip abd x10 Standing hip abd --> side step out/in + GTB x 20 B Resisted side stepping + GTB around thighs & RTB around ankles  Neuromuscular re-ed: SLB with fingertip --> no UE support (SBA) Quick weight shifting reactive strategy --> color dot call out with toe taps Therapeutic Activity: STS from corner of table x5 --> cradling 8#DB x8 Standing marching + 8#DB suitcase carry Curb navigation + [facility]                                                                                                                              PATIENT EDUCATION:  Education details: Updated HEP  Person educated: Patient Education method: Explanation, Demonstration, and Handouts Education comprehension: verbalized understanding and returned demonstration  HOME EXERCISE PROGRAM: Access Code: [email or web address] URL: [email or web address] Date: 07/13/2023 Prepared by: Carlynn Herald  Exercises - Sit to Stand  - 3 x daily - 7 x weekly - 1-2 sets - 10 reps - Supine Bridge  - 2 x daily - 7 x weekly - 1-3 sets - 10 reps - Clam with Resistance  - 1 x daily - 7 x weekly - 3 sets - 10 reps - Standing Balance in Corner with Eyes Closed  - 1 x daily - 7 x weekly - 3 sets - 10 reps - 10 sec hold - Semi-Tandem Corner Balance: Eyes Open With Head Turns  - 1 x daily - 7 x weekly - 3 sets -  10 reps - Semi-Tandem Corner  Balance With Eyes Closed  - 1 x daily - 7 x weekly - 3 sets - 10 reps - Standing Hip Abduction with Resistance at Ankles and Counter Support  - 1 x daily - 7 x weekly - 3 sets - 10 reps - Standing Hip Extension with Resistance at Ankles and Counter Support  - 1 x daily - 7 x weekly - 3 sets - 10 reps  ASSESSMENT:  CLINICAL IMPRESSION: Session focused on balance and dynamic balance with reaching activities. Patient challenged with progression from staggered stance to tandem; greater instability noted with R LE forward. HEP updated with balance exercise progressions.  EVAL: Patient is a 83 y.o. male who was seen today for physical therapy evaluation and treatment for unsteadiness and a h/o falls. He scored 44/56 on the BERG indicating he is a significant fall risk (>80%) and reports 6 falls over the past 6 months. He demonstrates LE functional weakness R>L and his significantly challenged with SLS activities. He also reports decreased sensation in B feet. He uses his cane in the community, but not at home. PT advised pt he should be using an AD at all times. He also reports he walks backwards down his basement steps using a hand rail. He will benefit from skilled PT to address these deficits.    OBJECTIVE IMPAIRMENTS: decreased balance, decreased strength, decreased safety awareness, impaired flexibility, impaired sensation, postural dysfunction, and pain.   ACTIVITY LIMITATIONS: stairs and locomotion level  PARTICIPATION LIMITATIONS: meal prep, cleaning, laundry, community activity, and yard work  PERSONAL FACTORS: Age, Past/current experiences, Time since onset of injury/illness/exacerbation, and 3+ comorbidities: leukemia, iron deficiency, DM, L knee pain  are also affecting patient's functional outcome.   REHAB POTENTIAL: Good  CLINICAL DECISION MAKING: Unstable/unpredictable  EVALUATION COMPLEXITY: High   GOALS: Goals reviewed with patient? Yes  SHORT TERM GOALS: Target date:  07/31/2023   Patient will be independent with initial HEP. Baseline:  Goal status: INITIAL  2.  Patient will demonstrate decreased risk for falls by scoring 48/56 on the BERG. Baseline: 44 Goal status: INITIAL  3.  Patient will be educated on strategies to decrease risk of falls.  Baseline:  Goal status: INITIAL   LONG TERM GOALS: Target date: 08/28/2023  Patient will be independent with advanced/ongoing HEP to improve outcomes and carryover.  Baseline:  Goal status: INITIAL  2.  Patient will score 52 on Berg Balance test to demonstrate lower risk of falls. (MCID= 8 points) .  Baseline: 44 Goal status: INITIAL  3.  Patient will be able to step up/down curb safely with LRAD for safety with community ambulation.  Baseline:  Goal status: INITIAL   4.  Patient will demonstrate decreased 5XSTS by 2-3 seconds and no LOB showing improved functional strength. Baseline: 15.12 sec Goal status: INITIAL  5.  Patient able to climb stairs safely with one rail support.  Baseline: descends stairs backwards Goal status: INITIAL  6.  Patient will report no falls during therapy episode. Baseline:  Goal status: INITIAL   PLAN:  PT FREQUENCY: 2x/week  PT DURATION: 8 weeks  PLANNED INTERVENTIONS: 97164- PT Re-evaluation, 97110-Therapeutic exercises, 97530- Therapeutic activity, [id number]- Neuromuscular re-education, 97535- Self Care, 16109- Manual therapy, [phone]- Gait training, Patient/Family education, Balance training, Stair training, and DME instructions  PLAN FOR NEXT SESSION: Balance --> proprioception. Gluteus med focus L>R with general LE strength, stairs, encourage AD at all times right now. Be aware of blood sugar levels as pt says  they have been inconsistent.   Carlynn Herald, PTA 07/20/2023, 11:52 AM

## 2023-07-23 ENCOUNTER — Ambulatory Visit

## 2023-07-23 DIAGNOSIS — R29898 Other symptoms and signs involving the musculoskeletal system: Secondary | ICD-10-CM | POA: Diagnosis not present

## 2023-07-23 DIAGNOSIS — R2689 Other abnormalities of gait and mobility: Secondary | ICD-10-CM | POA: Diagnosis not present

## 2023-07-23 DIAGNOSIS — R42 Dizziness and giddiness: Secondary | ICD-10-CM | POA: Diagnosis not present

## 2023-07-23 DIAGNOSIS — R2681 Unsteadiness on feet: Secondary | ICD-10-CM

## 2023-07-23 DIAGNOSIS — M542 Cervicalgia: Secondary | ICD-10-CM | POA: Diagnosis not present

## 2023-07-23 DIAGNOSIS — M6281 Muscle weakness (generalized): Secondary | ICD-10-CM

## 2023-07-23 DIAGNOSIS — G44221 Chronic tension-type headache, intractable: Secondary | ICD-10-CM | POA: Diagnosis not present

## 2023-07-23 DIAGNOSIS — R293 Abnormal posture: Secondary | ICD-10-CM | POA: Diagnosis not present

## 2023-07-23 NOTE — Therapy (Signed)
 OUTPATIENT PHYSICAL THERAPY LOWER EXTREMITY AND BALANCE TREATMENT   Patient Name: Robert Burns MRN: [id number] DOB:[birth year], 83 y.o., male Today's Date: 07/23/2023  END OF SESSION:  PT End of Session - 07/23/23 1147     Visit Number 7    Date for PT Re-Evaluation 08/28/23    Authorization Type [employer] MCR    Authorization Time Period 17 VISITS APPROVED FOR PT 07/03/2023-08/28/2023    Authorization - Visit Number 7    Authorization - Number of Visits 17    Progress Note Due on Visit 10    PT Start Time 1150    PT Stop Time 1228    PT Time Calculation (min) 38 min    Activity Tolerance Patient tolerated treatment well    Behavior During Therapy WFL for tasks assessed/performed            Past Medical History:  Diagnosis Date   Anemia    Anxiety    Arthritis    "all over" (11/25/2015)   CAD (coronary artery disease)    a. s/p CABG  06/2002; b. 02/01/15 PCI: DES to prox SVG to PDA, staged PCI of SVG to Diag in 02/2015; c. 04/2017 Cath/PCI: LM nl, LAD 100ost, 73m, 75d, LCX 60ost, OM2 80, RCA 100ost, RPDA 80, LIMA->LAD ok, VG->D1 patent stent, VG->RPDA patent stent, 50p, VG->OM1->OM2 90p (3.0x24 Synergy DES), 100 between OM1->OM2 (med rx).   Cancer [facility])    Right Shoulder, Left Leg- BCC, SCC, AND MELANOMA   Colon polyp    Elevated cholesterol    Epithelioid hemangioendothelioma    s/p resection of left SFA/mass with interposition 6 mm GoreTex graft 06/02/10, s/p repeat resection for positive margins 09/22/10   High blood pressure    Hyperlipidemia    Hypertension    Leg pain    OSA on CPAP    PAD (peripheral artery disease) (HCC)    a. 10/2002 L SFA PTA/BMS; b. 8/17 LE Angio: LEIA 90 (9x40 self exp stent), LSFA short segment prox occlusion (staged PTA/stenting 01/03/2016), patent mid stent, RSFA 50m (staged PTA/DEB 02/14/2016).   Presence of permanent cardiac pacemaker    Medtronic   Renal insufficiency 12/18/2022   SSS (sick sinus syndrome) (HCC)    a. s/p PPM in 2007 with  gen change 04/2015 - Medtronic Adapta ADDRL1, ser # [id number].   Type II diabetes mellitus (HCC)    Type II   Urgency of urination    Past Surgical History:  Procedure Laterality Date   CARDIAC CATHETERIZATION N/A 02/01/2015   Procedure: Left Heart Cath and Coronary Angiography;  Surgeon: Runell Gess, MD;  Location: [facility] INVASIVE CV LAB;  Service: Cardiovascular;  Laterality: N/A;   CARDIAC CATHETERIZATION N/A 02/01/2015   Procedure: Coronary Stent Intervention;  Surgeon: Runell Gess, MD;  Location: [facility] INVASIVE CV LAB;  Service: Cardiovascular;  Laterality: N/A;   CARDIAC CATHETERIZATION  06/2002   "just before bypass OR"   CARDIAC CATHETERIZATION N/A 03/01/2015   Procedure: Coronary Stent Intervention;  Surgeon: Runell Gess, MD;  Location: [facility] INVASIVE CV LAB;  Service: Cardiovascular;  Laterality: N/A;   CARDIAC CATHETERIZATION  02/06/2018   COLONOSCOPY     CORONARY ANGIOPLASTY     CORONARY ARTERY BYPASS GRAFT  06/2002   x5, LIMA-LAD;VG- Diag; seq VG- ramus & OM branch; VG-PDA   CORONARY STENT INTERVENTION N/A 04/12/2017   Procedure: CORONARY STENT INTERVENTION;  Surgeon: Runell Gess, MD;  Location: [facility] INVASIVE CV LAB;  Service: Cardiovascular;  Laterality: N/A;  CORONARY STENT INTERVENTION N/A 02/08/2018   Procedure: CORONARY STENT INTERVENTION;  Surgeon: Corky Crafts, MD;  Location: [facility] INVASIVE CV LAB;  Service: Cardiovascular;  Laterality: N/A;   CORONARY STENT INTERVENTION N/A 11/12/2018   Procedure: CORONARY STENT INTERVENTION;  Surgeon: Iran Ouch, MD;  Location: [facility] INVASIVE CV LAB;  Service: Cardiovascular;  Laterality: N/A;   CORONARY STENT INTERVENTION N/A 06/22/2020   Procedure: CORONARY STENT INTERVENTION;  Surgeon: Corky Crafts, MD;  Location: [facility] INVASIVE CV LAB;  Service: Cardiovascular;  Laterality: N/A;   EP IMPLANTABLE DEVICE N/A 04/23/2015   Procedure: PPM Generator Changeout;  Surgeon: Thurmon Fair, MD;  Location: [facility] INVASIVE CV LAB;   Service: Cardiovascular;  Laterality: N/A;   FALSE ANEURYSM REPAIR Left 11/29/2018   Procedure: REPAIR FALSE ANEURYSM LEFT RADIAL ARTERY;  Surgeon: Larina Earthly, MD;  Location: [facility] OR;  Service: Vascular;  Laterality: Left;   FEMORAL ARTERY STENT Left ~ 2014   "taken out of my leg; couldn' catorgorize what kind so the put it under all 3"; cataroziepitheloid hemanioendotheliomau   FOOT FRACTURE SURGERY Left 1973   FRACTURE SURGERY     INSERT / REPLACE / REMOVE PACEMAKER  10/13/05   right side, medtronic Adapta   IR 3D INDEPENDENT WKST  02/26/2023   IR ANGIOGRAM PELVIS SELECTIVE OR SUPRASELECTIVE  02/26/2023   IR ANGIOGRAM SELECTIVE EACH ADDITIONAL VESSEL  02/26/2023   IR ANGIOGRAM SELECTIVE EACH ADDITIONAL VESSEL  02/26/2023   IR EMBO TUMOR ORGAN ISCHEMIA INFARCT INC GUIDE ROADMAPPING  02/26/2023   IR RADIOLOGIST EVAL & MGMT  02/02/2023   IR US GUIDE VASC ACCESS LEFT  02/26/2023   IR US GUIDE VASC ACCESS LEFT  02/26/2023   IR US GUIDE VASC ACCESS LEFT  02/26/2023   KNEE HARDWARE REMOVAL Right 1950's   "3-4 months after the insertion"   KNEE SURGERY Right 1950's   "broke my lower leg; had to put pin in my knee to keep lower leg in place til it healed"   LEFT HEART CATH AND CORS/GRAFTS ANGIOGRAPHY N/A 04/12/2017   Procedure: LEFT HEART CATH AND CORS/GRAFTS ANGIOGRAPHY;  Surgeon: Runell Gess, MD;  Location: [facility] INVASIVE CV LAB;  Service: Cardiovascular;  Laterality: N/A;   LEFT HEART CATH AND CORS/GRAFTS ANGIOGRAPHY N/A 02/06/2018   Procedure: LEFT HEART CATH AND CORS/GRAFTS ANGIOGRAPHY;  Surgeon: Lennette Bihari, MD;  Location: [facility] INVASIVE CV LAB;  Service: Cardiovascular;  Laterality: N/A;   LEFT HEART CATH AND CORS/GRAFTS ANGIOGRAPHY N/A 11/12/2018   Procedure: LEFT HEART CATH AND CORS/GRAFTS ANGIOGRAPHY;  Surgeon: Iran Ouch, MD;  Location: [facility] INVASIVE CV LAB;  Service: Cardiovascular;  Laterality: N/A;   LEFT HEART CATH AND CORS/GRAFTS ANGIOGRAPHY N/A 06/22/2020   Procedure: LEFT  HEART CATH AND CORS/GRAFTS ANGIOGRAPHY;  Surgeon: Corky Crafts, MD;  Location: [facility] INVASIVE CV LAB;  Service: Cardiovascular;  Laterality: N/A;   LEFT HEART CATH AND CORS/GRAFTS ANGIOGRAPHY N/A 12/01/2022   Procedure: LEFT HEART CATH AND CORS/GRAFTS ANGIOGRAPHY;  Surgeon: Swazil[place], Peter M, MD;  Location: [facility] INVASIVE CV LAB;  Service: Cardiovascular;  Laterality: N/A;   PERIPHERAL VASCULAR CATHETERIZATION N/A 11/25/2015   Procedure: Lower Extremity Angiography;  Surgeon: Runell Gess, MD;  Location: [facility] INVASIVE CV LAB;  Service: Cardiovascular;  Laterality: N/A;   PERIPHERAL VASCULAR CATHETERIZATION Left 11/25/2015   Procedure: Peripheral Vascular Intervention;  Surgeon: Runell Gess, MD;  Location: [facility] INVASIVE CV LAB;  Service: Cardiovascular;  Laterality: Left;  external iliac   PERIPHERAL VASCULAR CATHETERIZATION N/A  01/03/2016   Procedure: Lower Extremity Angiography;  Surgeon: Avanell Leigh, MD;  Location: [facility] INVASIVE CV LAB;  Service: Cardiovascular;  Laterality: N/A;   PERIPHERAL VASCULAR CATHETERIZATION Left 01/03/2016   Procedure: Peripheral Vascular Intervention;  Surgeon: Avanell Leigh, MD;  Location: [facility] INVASIVE CV LAB;  Service: Cardiovascular;  Laterality: Left;  SFA   PERIPHERAL VASCULAR CATHETERIZATION Right 02/14/2016   Procedure: Peripheral Vascular Atherectomy;  Surgeon: Avanell Leigh, MD;  Location: [facility] INVASIVE CV LAB;  Service: Cardiovascular;  Laterality: Right;  SFA   POPLITEAL ARTERY STENT  01/03/2016   Contralateral access with a 7 French crossover sheath (second order catheter placement)   TONSILLECTOMY AND ADENOIDECTOMY     TUMOR EXCISION Right ~ 2005   cancerous tumor removed from shoulder   TUMOR EXCISION Right ~ 2000   benign tumor removed from under shoulder   TUMOR EXCISION Left 06/02/2010   resection of Lt SFA wth interposition of Gore-Tex graft   Patient Active Problem List   Diagnosis Date Noted   Complicated UTI (urinary tract infection)  05/03/2023   Hx of CABG 12/20/2022   Urinary tract infection with hematuria 12/18/2022   Sepsis (HCC) 12/18/2022   Iron deficiency 12/18/2022   Renal insufficiency 12/18/2022   NSTEMI (non-ST elevated myocardial infarction) (HCC) 11/30/2022   Arthritis pain 11/30/2022   BPH (benign prostatic hyperplasia) 11/30/2022   Iron deficiency anemia due to chronic blood loss 11/15/2022   Acute encephalopathy 02/06/2022   Elevated troponin    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   Hematoma of right hip 01/10/2021   Primary osteoarthritis of right elbow 01/10/2021   Chronic anticoagulation 08/12/2019   Dyslipidemia (high LDL; low HDL) 03/05/2019   Unstable angina (HCC) 11/11/2018   Hypercholesterolemia 02/20/2018   PAF (paroxysmal atrial fibrillation) (HCC) 02/20/2018   CAD (coronary artery disease) of bypass graft 02/06/2018   CAD, multiple vessel 02/06/2018   Leukocytosis 02/16/2016   Transient hypotension 02/15/2016   Peripheral arterial disease (HCC)    Pacemaker 04/23/2015   Positive cardiac stress test    Chest pain    OSA on CPAP 11/04/2012   SSS (sick sinus syndrome), medtronic adapta    Hyperlipidemia due to type 2 diabetes mellitus (HCC)    Superficial femoral artery injury 05/09/2011   SPRAIN&STRAIN OTHER SPECIFIED SITES KNEE&LEG 01/31/2010   Type 2 diabetes mellitus with complication, without long-term current use of insulin (HCC) 11/26/2006   Essential hypertension 11/26/2006    PCP: Tena Feeling, MD   REFERRING PROVIDER: Tena Feeling, MD   REFERRING DIAG: Z91.81 History of falls  THERAPY DIAG:  Unsteadiness on feet  Balance disorder  Muscle weakness (generalized)  Rationale for Evaluation and Treatment: Rehabilitation  ONSET DATE: October   SUBJECTIVE:   SUBJECTIVE STATEMENT: Patient reports no falls since last visit; states he did not have time to review balance HEP.   EVAL: Since October had a MI and have been in the hospital a lot  (most recent 05/02/23) I've lost 10-15 pounds. He has had 2 infusions of iron. I've been fatigued a lot. My DM is kicking up. I'm wearing a monitor, but don't quite understand it. It climbs in a heartbeat to 300 and then in the morning it's down to 130. I eat oatmeal and it shoots back up to 300. (Dr. Candi Chafe). He does not use the cane at home. Only when out. I'm losing the feeling in my feet. Gabapentin doesn't help. Wears a lift in the R shoe due  to leg length discrepancy. Some left knee pain and wears brace.    PERTINENT HISTORY: CLL (leukemia), PAD, DM, CAD, PACEMAKER, on a catheter x 5 months PAIN:  Are you having pain? No - later reports he wears a brace for L knee pain.  PRECAUTIONS: Fall and ICD/Pacemaker  RED FLAGS: None   WEIGHT BEARING RESTRICTIONS: No  FALLS:  Has patient fallen in last 6 months? Yes. Number of falls 6 tripping  LIVING ENVIRONMENT: Lives with: lives with their spouse Lives in: House/apartment Stairs: Yes: Internal: 13 steps; on left going up and External: 1 steps; no rail Has following equipment at home: Single point cane, Walker - 2 wheeled, and Environmental consultant - 4 wheeled  OCCUPATION: retired  PLOF: Independent and daughter does most of the driving, but he does drive  PATIENT GOALS: to feel less clumsy  NEXT MD VISIT: a few months  OBJECTIVE:  Note: Objective measures were completed at Evaluation unless otherwise noted.  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   MUSCLE LENGTH: Tight B gastrocs  POSTURE: rounded shoulders, forward head, and posterior pelvic tilt   LOWER EXTREMITY ROM: WFL for tasks assessed  Active ROM Right eval Left eval  Hip flexion    Hip extension    Hip abduction    Hip adduction    Hip internal rotation    Hip external rotation    Knee flexion     Knee extension    Ankle dorsiflexion    Ankle plantarflexion    Ankle inversion    Ankle eversion     (Blank rows = not tested)  LOWER EXTREMITY MMT: ABD/ADD in sitting  MMT Right eval Left eval  Hip flexion 4- 4  Hip extension    Hip abduction 5 5  Hip adduction 5 5  Hip internal rotation    Hip external rotation    Knee flexion    Knee extension 5 5  Ankle dorsiflexion 5 5  Ankle plantarflexion    Ankle inversion    Ankle eversion     (Blank rows = not tested)   FUNCTIONAL TESTS:  5 times sit to stand: 15.12 sec (one LOB and sat back down) Timed up and go (TUG): 10.21 sec Berg Balance Scale: 44/56 37-45 significant fall risk (>80%) mCTSIB: 120/120 (mod-min sway on C4)  GAIT: Distance walked: 20 Assistive device utilized: Single point cane Level of assistance: Modified independence Comments: Wide BOS, B hip ER   [facility] Adult PT Treatment:                                                DATE: 07/23/2023 Therapeutic Exercise: Standing at counter: Heel raises x20 Alt toe raises x20 Side stepping + blue TB around thighs Squat side stepping + blue TB around thighs Neuromuscular re-ed: Airex: mREO + slow head turns/nods mREC static hold Staggered stance EO --> added slow head turns/nods Therapeutic Activity: Curb navigation with SPC Step together Step up and over Lateral straddle stepping up/down on stepper + HHAx2 Single stance step up on stepper + HHAx1   [facility] Adult PT Treatment:  DATE: 07/20/2023 Therapeutic Exercise: Standing at counter: Heel raises x20 Toe raises x20 Squats x10 --> added GTB above knees x10 HS curls + 3#AW x15, x10 from toe tap position Hip abduction + 3#AW x20 (B)  Hip extension + 3#AW x15 (B) Neuromuscular re-ed: Standing in corner with chair in front: Airex --> REO + head turns/nods Airex --> REC static hold Solid ground --> progressing staggered stance to tandem stance, EO & slow  head turns Therapeutic Activity: Reaching beyond BOS --> across midline Standing in corner with chair in front --> reaching beyond BOS, reaching across midline, reaching high/low    [facility] Adult PT Treatment:                                                DATE: 07/17/2023 Therapeutic Exercise: Supine: LTR x Bridges 2x10 Side Lying: Clamshell + 3#AW (holding on thigh) x10 Bent knee hip abd 2x10 Straight leg hip abd x10 Standing hip abd --> side step out/in + GTB Resisted side stepping + GTB around thighs & RTB around ankles  Neuromuscular re-ed: SLB with fingertip --> no UE support (SBA) Quick weight shifting reactive strategy -->  SLS tapping front and back to dots Toe taps to 4 and 6 inch step x 10 ea B Therapeutic Activity: STS from corner of table x --> cradling 8#DB x 10 Standing marching + 8#DB suitcase carry 2x 10 Curb navigation + SPC -  obstacles and 6 inch step/curb  [facility] Adult PT Treatment:                                                DATE: 07/13/2023 Therapeutic Exercise: Supine: LTR x Bridges 2x10 Side Lying: Clamshell + 3#AW (holding on thigh) x10 Bent knee hip abd 2x10 Straight leg hip abd x10 Standing hip abd --> side step out/in + GTB x 20 B Resisted side stepping + GTB around thighs & RTB around ankles  Neuromuscular re-ed: SLB with fingertip --> no UE support (SBA) Quick weight shifting reactive strategy --> color dot call out with toe taps Therapeutic Activity: STS from corner of table x5 --> cradling 8#DB x8 Standing marching + 8#DB suitcase carry Curb navigation + [facility]                                                                                                                              PATIENT EDUCATION:  Education details: Updated HEP  Person educated: Patient Education method: Explanation, Demonstration, and Handouts Education comprehension: verbalized understanding and returned demonstration  HOME EXERCISE PROGRAM: Access Code:  [email or web address] URL: [email or web address] Date: 07/13/2023 Prepared by: Sims Duck  Exercises - Sit to Stand  - 3 x  daily - 7 x weekly - 1-2 sets - 10 reps - Supine Bridge  - 2 x daily - 7 x weekly - 1-3 sets - 10 reps - Clam with Resistance  - 1 x daily - 7 x weekly - 3 sets - 10 reps - Standing Balance in Corner with Eyes Closed  - 1 x daily - 7 x weekly - 3 sets - 10 reps - 10 sec hold - Semi-Tandem Corner Balance: Eyes Open With Head Turns  - 1 x daily - 7 x weekly - 3 sets - 10 reps - Semi-Tandem Corner Balance With Eyes Closed  - 1 x daily - 7 x weekly - 3 sets - 10 reps - Standing Hip Abduction with Resistance at Ankles and Counter Support  - 1 x daily - 7 x weekly - 3 sets - 10 reps - Standing Hip Extension with Resistance at Ankles and Counter Support  - 1 x daily - 7 x weekly - 3 sets - 10 reps  ASSESSMENT:  CLINICAL IMPRESSION: Curb navigation progressed towards step over pattern. Stair navigation progressed with lateral reciprocal stepping and slow single leg step ups. Multi-directional stepping on/off compliance surface incorporated to progress safe foot clearance when transitioning surfaces.   EVAL: Patient is a 83 y.o. male who was seen today for physical therapy evaluation and treatment for unsteadiness and a h/o falls. He scored 44/56 on the BERG indicating he is a significant fall risk (>80%) and reports 6 falls over the past 6 months. He demonstrates LE functional weakness R>L and his significantly challenged with SLS activities. He also reports decreased sensation in B feet. He uses his cane in the community, but not at home. PT advised pt he should be using an AD at all times. He also reports he walks backwards down his basement steps using a hand rail. He will benefit from skilled PT to address these deficits.    OBJECTIVE IMPAIRMENTS: decreased balance, decreased strength, decreased safety awareness, impaired flexibility, impaired sensation, postural  dysfunction, and pain.   ACTIVITY LIMITATIONS: stairs and locomotion level  PARTICIPATION LIMITATIONS: meal prep, cleaning, laundry, community activity, and yard work  PERSONAL FACTORS: Age, Past/current experiences, Time since onset of injury/illness/exacerbation, and 3+ comorbidities: leukemia, iron deficiency, DM, L knee pain  are also affecting patient's functional outcome.   REHAB POTENTIAL: Good  CLINICAL DECISION MAKING: Unstable/unpredictable  EVALUATION COMPLEXITY: High   GOALS: Goals reviewed with patient? Yes  SHORT TERM GOALS: Target date: 07/31/2023   Patient will be independent with initial HEP. Baseline:  Goal status: INITIAL  2.  Patient will demonstrate decreased risk for falls by scoring 48/56 on the BERG. Baseline: 44 Goal status: INITIAL  3.  Patient will be educated on strategies to decrease risk of falls.  Baseline:  Goal status: INITIAL   LONG TERM GOALS: Target date: 08/28/2023  Patient will be independent with advanced/ongoing HEP to improve outcomes and carryover.  Baseline:  Goal status: INITIAL  2.  Patient will score 52 on Berg Balance test to demonstrate lower risk of falls. (MCID= 8 points) .  Baseline: 44 Goal status: INITIAL  3.  Patient will be able to step up/down curb safely with LRAD for safety with community ambulation.  Baseline:  Goal status: INITIAL   4.  Patient will demonstrate decreased 5XSTS by 2-3 seconds and no LOB showing improved functional strength. Baseline: 15.12 sec Goal status: INITIAL  5.  Patient able to climb stairs safely with one rail support.  Baseline:  descends stairs backwards Goal status: INITIAL  6.  Patient will report no falls during therapy episode. Baseline:  Goal status: INITIAL   PLAN:  PT FREQUENCY: 2x/week  PT DURATION: 8 weeks  PLANNED INTERVENTIONS: 97164- PT Re-evaluation, 97110-Therapeutic exercises, 97530- Therapeutic activity, [id number]- Neuromuscular re-education, 97535- Self  Care, [id number]- Manual therapy, [id number]- Gait training, Patient/Family education, Balance training, Stair training, and DME instructions  PLAN FOR NEXT SESSION: Balance --> proprioception. Gluteus med focus L>R with general LE strength, stairs, encourage AD at all times right now. Be aware of blood sugar levels as pt says they have been inconsistent.   Sims Duck, PTA 07/23/2023, 12:37 PM

## 2023-07-25 ENCOUNTER — Ambulatory Visit: Admitting: Physical Therapy

## 2023-07-25 ENCOUNTER — Telehealth: Payer: Self-pay | Admitting: Hematology and Oncology

## 2023-07-25 DIAGNOSIS — R338 Other retention of urine: Secondary | ICD-10-CM | POA: Diagnosis not present

## 2023-07-26 ENCOUNTER — Inpatient Hospital Stay (HOSPITAL_BASED_OUTPATIENT_CLINIC_OR_DEPARTMENT_OTHER): Payer: Medicare HMO | Admitting: Hematology and Oncology

## 2023-07-26 ENCOUNTER — Inpatient Hospital Stay

## 2023-07-26 VITALS — BP 150/94 | HR 96 | Temp 97.6°F | Resp 16 | Wt 156.6 lb

## 2023-07-26 DIAGNOSIS — D649 Anemia, unspecified: Secondary | ICD-10-CM | POA: Diagnosis not present

## 2023-07-26 DIAGNOSIS — D72820 Lymphocytosis (symptomatic): Secondary | ICD-10-CM

## 2023-07-26 DIAGNOSIS — D500 Iron deficiency anemia secondary to blood loss (chronic): Secondary | ICD-10-CM | POA: Diagnosis not present

## 2023-07-26 DIAGNOSIS — C9110 Chronic lymphocytic leukemia of B-cell type not having achieved remission: Secondary | ICD-10-CM | POA: Diagnosis not present

## 2023-07-26 LAB — CBC WITH DIFFERENTIAL (CANCER CENTER ONLY)
Abs Immature Granulocytes: 0.03 10*3/uL (ref 0.00–0.07)
Basophils Absolute: 0 10*3/uL (ref 0.0–0.1)
Basophils Relative: 0 %
Eosinophils Absolute: 0.1 10*3/uL (ref 0.0–0.5)
Eosinophils Relative: 1 %
HCT: 43.4 % (ref 39.0–52.0)
Hemoglobin: 13.8 g/dL (ref 13.0–17.0)
Immature Granulocytes: 0 %
Lymphocytes Relative: 63 %
Lymphs Abs: 9.3 10*3/uL — ABNORMAL HIGH (ref 0.7–4.0)
MCH: 28 pg (ref 26.0–34.0)
MCHC: 31.8 g/dL (ref 30.0–36.0)
MCV: 88.2 fL (ref 80.0–100.0)
Monocytes Absolute: 0.6 10*3/uL (ref 0.1–1.0)
Monocytes Relative: 4 %
Neutro Abs: 4.6 10*3/uL (ref 1.7–7.7)
Neutrophils Relative %: 32 %
Platelet Count: 142 10*3/uL — ABNORMAL LOW (ref 150–400)
RBC: 4.92 MIL/uL (ref 4.22–5.81)
RDW: 17.2 % — ABNORMAL HIGH (ref 11.5–15.5)
Smear Review: NORMAL
WBC Count: 14.7 10*3/uL — ABNORMAL HIGH (ref 4.0–10.5)
nRBC: 0 % (ref 0.0–0.2)

## 2023-07-26 LAB — CMP (CANCER CENTER ONLY)
ALT: 26 U/L (ref 0–44)
AST: 19 U/L (ref 15–41)
Albumin: 4.8 g/dL (ref 3.5–5.0)
Alkaline Phosphatase: 69 U/L (ref 38–126)
Anion gap: 5 (ref 5–15)
BUN: 23 mg/dL (ref 8–23)
CO2: 31 mmol/L (ref 22–32)
Calcium: 10.1 mg/dL (ref 8.9–10.3)
Chloride: 109 mmol/L (ref 98–111)
Creatinine: 1.01 mg/dL (ref 0.61–1.24)
GFR, Estimated: >60 mL/min (ref >60)
Glucose, Bld: 175 mg/dL — ABNORMAL HIGH (ref 70–99)
Potassium: 4.4 mmol/L (ref 3.5–5.1)
Sodium: 145 mmol/L (ref 135–145)
Total Bilirubin: 0.6 mg/dL (ref 0.0–1.2)
Total Protein: 7.3 g/dL (ref 6.5–8.1)

## 2023-07-26 LAB — SAMPLE TO BLOOD BANK

## 2023-07-26 LAB — IRON AND IRON BINDING CAPACITY (CC-WL,HP ONLY)
Iron: 59 ug/dL (ref 45–182)
Saturation Ratios: 17 % — ABNORMAL LOW (ref 17.9–39.5)
TIBC: 358 ug/dL (ref 250–450)
UIBC: 299 ug/dL (ref 117–376)

## 2023-07-26 LAB — FERRITIN: Ferritin: 103 ng/mL (ref 24–336)

## 2023-07-26 NOTE — Progress Notes (Signed)
 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 Lysle Saunas: --WBC 18.6, Hgb 11.7, MCV 80.4, Plt 164, Lymph 12.40.  2)  08/24/2021: Establish care with [facility] Hematology.  Flow cytometry results most consistent with CLL  Interval History:  Robert Burns 83 y.o. male with medical history significant for newly diagnosed CLL who presents for a follow up visit. The patient's last visit was on 04/13/2023. In the interim since the last visit he has had no major changes in his health.  On exam today Robert Burns reports he has had 4-5 hospitalizations in the intensive last visit.  He is mostly related to cardiac issues.  Reports that he has been working with physical therapy.  He notes he has not had any trouble with bleeding, bruising, or dark stools.  He denies any bumps or lumps concerning for enlarged lymph nodes.  He has had no recent infectious symptoms.  At the moment he is at his baseline level of health. Otherwise he has no questions concerns or complaints today.  He otherwise denies any fevers, chills, sweats, nausea, vomiting or diarrhea.  Full 10 point ROS is listed below.   MEDICAL HISTORY:  Past Medical History:  Diagnosis Date   Anemia    Anxiety    Arthritis    "all over" (11/25/2015)   CAD (coronary artery disease)    a. s/p CABG  06/2002; b. 02/01/15 PCI: DES to prox SVG to PDA, staged PCI of SVG to Diag in 02/2015; c. 04/2017 Cath/PCI: LM nl, LAD 100ost, 6m, 75d, LCX 60ost, OM2 80, RCA 100ost, RPDA 80, LIMA->LAD ok,  VG->D1 patent stent, VG->RPDA patent stent, 50p, VG->OM1->OM2 90p (3.0x24 Synergy DES), 100 between OM1->OM2 (med rx).   Cancer [facility])    Right Shoulder, Left Leg- BCC, SCC, AND MELANOMA   Colon polyp    Elevated cholesterol    Epithelioid hemangioendothelioma    s/p resection of left SFA/mass with interposition 6 mm GoreTex graft 06/02/10, s/p repeat resection for positive margins 09/22/10   High blood pressure    Hyperlipidemia    Hypertension    Leg pain    OSA on CPAP    PAD (peripheral artery disease) (HCC)    a. 10/2002 L SFA PTA/BMS; b. 8/17 LE Angio: LEIA 90 (9x40 self exp stent), LSFA short segment prox occlusion (staged PTA/stenting 01/03/2016), patent mid stent, RSFA 25m (staged PTA/DEB 02/14/2016).   Presence of permanent cardiac pacemaker    Medtronic   Renal insufficiency 12/18/2022   SSS (sick sinus syndrome) (HCC)    a. s/p PPM in 2007 with gen change 04/2015 - Medtronic Adapta ADDRL1, ser # [id number] H.   Type II diabetes mellitus (HCC)    Type II   Urgency of urination     SURGICAL HISTORY: Past Surgical History:  Procedure Laterality Date   CARDIAC CATHETERIZATION N/A 02/01/2015   Procedure: Left Heart Cath and Coronary Angiography;  Surgeon: Avanell Leigh, MD;  Location: [facility] INVASIVE CV LAB;  Service: Cardiovascular;  Laterality: N/A;   CARDIAC CATHETERIZATION N/A 02/01/2015   Procedure: Coronary Stent Intervention;  Surgeon: Avanell Leigh, MD;  Location: [facility] INVASIVE CV LAB;  Service: Cardiovascular;  Laterality: N/A;   CARDIAC CATHETERIZATION  06/2002   "just before bypass OR"   CARDIAC CATHETERIZATION N/A 03/01/2015   Procedure: Coronary Stent Intervention;  Surgeon: Avanell Leigh, MD;  Location: [facility] INVASIVE CV LAB;  Service: Cardiovascular;  Laterality: N/A;   CARDIAC CATHETERIZATION  02/06/2018   COLONOSCOPY     CORONARY ANGIOPLASTY     CORONARY ARTERY BYPASS GRAFT  06/2002   x5, LIMA-LAD;VG- Diag; seq VG- ramus & OM branch; VG-PDA   CORONARY STENT  INTERVENTION N/A 04/12/2017   Procedure: CORONARY STENT INTERVENTION;  Surgeon: Avanell Leigh, MD;  Location: [facility] INVASIVE CV LAB;  Service: Cardiovascular;  Laterality: N/A;   CORONARY STENT INTERVENTION N/A 02/08/2018   Procedure: CORONARY STENT INTERVENTION;  Surgeon: Lucendia Rusk, MD;  Location: [facility] INVASIVE CV LAB;  Service: Cardiovascular;  Laterality: N/A;   CORONARY STENT INTERVENTION N/A 11/12/2018   Procedure: CORONARY STENT INTERVENTION;  Surgeon: Wenona Hamilton, MD;  Location: [facility] INVASIVE CV LAB;  Service: Cardiovascular;  Laterality: N/A;   CORONARY STENT INTERVENTION N/A 06/22/2020   Procedure: CORONARY STENT INTERVENTION;  Surgeon: Lucendia Rusk, MD;  Location: [facility] INVASIVE CV LAB;  Service: Cardiovascular;  Laterality: N/A;   EP IMPLANTABLE DEVICE N/A 04/23/2015   Procedure: PPM Generator Changeout;  Surgeon: Luana Rumple, MD;  Location: [facility] INVASIVE CV LAB;  Service: Cardiovascular;  Laterality: N/A;   FALSE ANEURYSM REPAIR Left 11/29/2018   Procedure: REPAIR FALSE ANEURYSM LEFT RADIAL ARTERY;  Surgeon: Mayo Speck, MD;  Location: [facility] OR;  Service: Vascular;  Laterality: Left;   FEMORAL ARTERY STENT Left ~ 2014   "taken out of my leg; couldn' catorgorize what kind so the put it under all 3"; cataroziepitheloid hemanioendotheliomau   FOOT FRACTURE SURGERY Left 1973   FRACTURE SURGERY     INSERT / REPLACE / REMOVE PACEMAKER  10/13/05   right side, medtronic Adapta   IR 3D INDEPENDENT WKST  02/26/2023   IR ANGIOGRAM PELVIS SELECTIVE OR SUPRASELECTIVE  02/26/2023   IR ANGIOGRAM SELECTIVE EACH ADDITIONAL VESSEL  02/26/2023   IR ANGIOGRAM SELECTIVE EACH ADDITIONAL VESSEL  02/26/2023   IR EMBO TUMOR ORGAN ISCHEMIA INFARCT INC GUIDE ROADMAPPING  02/26/2023   IR RADIOLOGIST EVAL & MGMT  02/02/2023   IR US  GUIDE VASC ACCESS LEFT  02/26/2023   IR US  GUIDE VASC ACCESS LEFT  02/26/2023   IR US  GUIDE VASC ACCESS LEFT  02/26/2023   KNEE HARDWARE REMOVAL Right 1950's   "3-4  months after the insertion"   KNEE SURGERY Right 1950's   "broke my lower leg; had to put pin in my knee to keep lower leg in place til it healed"   LEFT HEART CATH AND CORS/GRAFTS ANGIOGRAPHY N/A 04/12/2017   Procedure: LEFT HEART CATH AND CORS/GRAFTS ANGIOGRAPHY;  Surgeon: Avanell Leigh, MD;  Location: [facility] INVASIVE CV LAB;  Service: Cardiovascular;  Laterality: N/A;   LEFT HEART CATH AND CORS/GRAFTS ANGIOGRAPHY N/A 02/06/2018   Procedure: LEFT HEART CATH AND CORS/GRAFTS ANGIOGRAPHY;  Surgeon: Millicent Ally, MD;  Location: [facility] INVASIVE CV LAB;  Service: Cardiovascular;  Laterality: N/A;   LEFT HEART CATH AND CORS/GRAFTS ANGIOGRAPHY N/A 11/12/2018   Procedure: LEFT HEART CATH AND CORS/GRAFTS ANGIOGRAPHY;  Surgeon: Wenona Hamilton, MD;  Location: [facility] INVASIVE CV LAB;  Service: Cardiovascular;  Laterality: N/A;   LEFT HEART CATH AND CORS/GRAFTS ANGIOGRAPHY N/A 06/22/2020   Procedure: LEFT HEART CATH  AND CORS/GRAFTS ANGIOGRAPHY;  Surgeon: Lucendia Rusk, MD;  Location: [facility] INVASIVE CV LAB;  Service: Cardiovascular;  Laterality: N/A;   LEFT HEART CATH AND CORS/GRAFTS ANGIOGRAPHY N/A 12/01/2022   Procedure: LEFT HEART CATH AND CORS/GRAFTS ANGIOGRAPHY;  Surgeon: [place], Peter M, MD;  Location: [facility] INVASIVE CV LAB;  Service: Cardiovascular;  Laterality: N/A;   PERIPHERAL VASCULAR CATHETERIZATION N/A 11/25/2015   Procedure: Lower Extremity Angiography;  Surgeon: Avanell Leigh, MD;  Location: [facility] INVASIVE CV LAB;  Service: Cardiovascular;  Laterality: N/A;   PERIPHERAL VASCULAR CATHETERIZATION Left 11/25/2015   Procedure: Peripheral Vascular Intervention;  Surgeon: Avanell Leigh, MD;  Location: [id number] INVASIVE CV LAB;  Service: Cardiovascular;  Laterality: Left;  external iliac   PERIPHERAL VASCULAR CATHETERIZATION N/A 01/03/2016   Procedure: Lower Extremity Angiography;  Surgeon: Avanell Leigh, MD;  Location: [facility] INVASIVE CV LAB;  Service: Cardiovascular;  Laterality: N/A;   PERIPHERAL VASCULAR  CATHETERIZATION Left 01/03/2016   Procedure: Peripheral Vascular Intervention;  Surgeon: Avanell Leigh, MD;  Location: [facility] INVASIVE CV LAB;  Service: Cardiovascular;  Laterality: Left;  SFA   PERIPHERAL VASCULAR CATHETERIZATION Right 02/14/2016   Procedure: Peripheral Vascular Atherectomy;  Surgeon: Avanell Leigh, MD;  Location: [facility] INVASIVE CV LAB;  Service: Cardiovascular;  Laterality: Right;  SFA   POPLITEAL ARTERY STENT  01/03/2016   Contralateral access with a 7 [name] crossover sheath (second order catheter placement)   TONSILLECTOMY AND ADENOIDECTOMY     TUMOR EXCISION Right ~ 2005   cancerous tumor removed from shoulder   TUMOR EXCISION Right ~ 2000   benign tumor removed from under shoulder   TUMOR EXCISION Left 06/02/2010   resection of Lt SFA wth interposition of Gore-Tex graft    SOCIAL HISTORY: Social History   Socioeconomic History   Marital status: Married    Spouse name: Not on file   Number of children: 2   Years of education: Not on file   Highest education level: Not on file  Occupational History   Occupation: retired  Tobacco Use   Smoking status: Former    Types: Pipe    Quit date: 1973    Years since quitting: 52.3   Smokeless tobacco: Never   Tobacco comments:    "quit smoking in 1973"  Vaping Use   Vaping status: Never Used  Substance and Sexual Activity   Alcohol use: No   Drug use: No   Sexual activity: Not Currently  Other Topics Concern   Not on file  Social History Narrative   Not on file   Social Drivers of Health   Financial Resource Strain: Not on file  Food Insecurity: No Food Insecurity (05/03/2023)   Hunger Vital Sign    Worried About Running Out of Food in the Last Year: Never true    Ran Out of Food in the Last Year: Never true  Transportation Needs: No Transportation Needs (05/03/2023)   PRAPARE - Administrator, Civil Service (Medical): No    Lack of Transportation (Non-Medical): No  Physical Activity: Not on  file  Stress: No Stress Concern Present (12/07/2022)   Received from [facility] of Occupational Health - Occupational Stress Questionnaire    Feeling of Stress : Not at all  Social Connections: Socially Integrated (05/03/2023)   Social Connection and Isolation Panel [NHANES]    Frequency of Communication with Friends and Family: Twice a week    Frequency of Social Gatherings with Friends and Family: Twice  a week    Attends Religious Services: 1 to 4 times per year    Active Member of Clubs or Organizations: Yes    Attends Banker Meetings: 1 to 4 times per year    Marital Status: Married  Catering manager Violence: Not At Risk (05/03/2023)   Humiliation, Afraid, Rape, and Kick questionnaire    Fear of Current or Ex-Partner: No    Emotionally Abused: No    Physically Abused: No    Sexually Abused: No    FAMILY HISTORY: Family History  Problem Relation Age of Onset   Coronary artery disease Mother    Heart attack Mother    Hypertension Mother    Heart disease Mother        Open  Heart surgery   Diabetes Father    Heart disease Father    Hyperlipidemia Father    Heart attack Father    Hypertension Sister    Diabetes Sister    Heart disease Sister    Liver disease Neg Hx    Esophageal cancer Neg Hx    Colon cancer Neg Hx     ALLERGIES:  is allergic to peanut-containing drug products, ace inhibitors, diltiazem hcl, meloxicam, doxycycline, eliquis  [apixaban ], sertraline  hcl, carvedilol, clonidine  hcl, and duloxetine hcl.  MEDICATIONS:  Current Outpatient Medications  Medication Sig Dispense Refill   atorvastatin  (LIPITOR ) 80 MG tablet Take 1 tablet (80 mg total) by mouth daily at 6 PM. 90 tablet 1   finasteride  (PROSCAR ) 5 MG tablet Take 5 mg by mouth daily.     glipiZIDE  (GLUCOTROL ) 5 MG tablet Take 5 mg by mouth daily with breakfast.      isosorbide  mononitrate (IMDUR ) 60 MG 24 hr tablet Take 60 mg by mouth daily.     JARDIANCE  10 MG TABS  tablet Take 1 tablet (10 mg total) by mouth daily. 30 tablet 6   metFORMIN  (GLUCOPHAGE ) 1000 MG tablet Take 1 tablet (1,000 mg total) by mouth 2 (two) times daily with a meal. Restart on 3/18.     metoprolol  tartrate (LOPRESSOR ) 50 MG tablet Take 50 mg by mouth 2 (two) times daily.     Multiple Vitamin-Folic Acid  TABS Take 1 tablet by mouth daily.     nitroGLYCERIN  (NITROSTAT ) 0.4 MG SL tablet Place 1 tablet (0.4 mg total) under the tongue every 5 (five) minutes as needed for chest pain. 25 tablet 3   pantoprazole  (PROTONIX ) 40 MG tablet Take 1 tablet (40 mg total) by mouth daily. 90 tablet 1   ranolazine  (RANEXA ) 500 MG 12 hr tablet Take 500 mg by mouth 2 (two) times daily.     rivaroxaban  (XARELTO ) 20 MG TABS tablet Take 1 tablet (20 mg total) by mouth daily with supper. (Patient taking differently: Take 20 mg by mouth at bedtime.) 90 tablet 3   silodosin (RAPAFLO) 8 MG CAPS capsule Take 8 mg by mouth daily.     tamsulosin  (FLOMAX ) 0.4 MG CAPS capsule Take 0.4 mg by mouth daily after supper.     VITAMIN D  PO Take 1,000 Units by mouth in the morning and at bedtime.     No current facility-administered medications for this visit.    REVIEW OF SYSTEMS:   Constitutional: ( - ) fevers, ( - )  chills , ( - ) night sweats Eyes: ( - ) blurriness of vision, ( - ) double vision, ( - ) watery eyes Ears, nose, mouth, throat, and face: ( - ) mucositis, ( - ) sore  throat Respiratory: ( - ) cough, ( - ) dyspnea, ( - ) wheezes Cardiovascular: ( - ) palpitation, ( - ) chest discomfort, ( - ) lower extremity swelling Gastrointestinal:  ( - ) nausea, ( - ) heartburn, ( - ) change in bowel habits Skin: ( - ) abnormal skin rashes Lymphatics: ( - ) new lymphadenopathy, ( - ) easy bruising Neurological: ( - ) numbness, ( - ) tingling, ( - ) new weaknesses Behavioral/Psych: ( - ) mood change, ( - ) new changes  All other systems were reviewed with the patient and are negative.  PHYSICAL EXAMINATION: ECOG  PERFORMANCE STATUS: 0 - Asymptomatic  Vitals:   07/26/23 1100  BP: (!) 150/94  Pulse: 96  Resp: 16  Temp: 97.6 F (36.4 C)  SpO2: 100%       Filed Weights   07/26/23 1100  Weight: 156 lb 9.6 oz (71 kg)        GENERAL: Well-appearing elderly Caucasian male, alert, no distress and comfortable SKIN: skin color, texture, turgor are normal, no rashes or significant lesions EYES: conjunctiva are pink and non-injected, sclera clear NECK: supple, non-tender LYMPH:  no palpable lymphadenopathy in the cervical, axillary or inguinal LUNGS: clear to auscultation and percussion with normal breathing effort HEART: regular rate & rhythm and no murmurs and no lower extremity edema ABDOMEN: No evidence of hepatosplenomegaly.  Soft, non-tender, non-distended, normal bowel sounds Musculoskeletal: no cyanosis of digits and no clubbing  PSYCH: alert & oriented x 3, fluent speech NEURO: no focal motor/sensory deficits  LABORATORY DATA:  I have reviewed the data as listed    Latest Ref Rng & Units 07/26/2023   10:22 AM 05/05/2023    7:14 AM 05/04/2023    4:18 AM  CBC  WBC 4.0 - 10.5 K/uL 14.7  13.6  15.4   Hemoglobin 13.0 - 17.0 g/dL 16.1  09.6  04.5   Hematocrit 39.0 - 52.0 % 43.4  32.1  31.7   Platelets 150 - 400 K/uL 142  134  155        Latest Ref Rng & Units 07/26/2023   10:22 AM 05/06/2023    5:07 AM 05/04/2023    4:18 AM  CMP  Glucose 70 - 99 mg/dL 409   811   BUN 8 - 23 mg/dL 23   28   Creatinine 9.14 - 1.24 mg/dL 7.82  9.56  2.13   Sodium 135 - 145 mmol/L 145   139   Potassium 3.5 - 5.1 mmol/L 4.4   3.5   Chloride 98 - 111 mmol/L 109   105   CO2 22 - 32 mmol/L 31   24   Calcium  8.9 - 10.3 mg/dL 08.6   8.7   Total Protein 6.5 - 8.1 g/dL 7.3     Total Bilirubin 0.0 - 1.2 mg/dL 0.6     Alkaline Phos 38 - 126 U/L 69     AST 15 - 41 U/L 19     ALT 0 - 44 U/L 26       RADIOGRAPHIC STUDIES: No results found.  ASSESSMENT & PLAN Robert Burns 83 y.o. male with medical  history significant for newly diagnosed CLL who presents for a follow up visit.  Previously we discussed the diagnosis of CLL and treatment options moving forward.  We discussed that this is a chronic condition with no definitive cure.  Additionally we discussed that treatment is reserved until certain criteria are met.  Typically treatment is  started with rapid increase in lymphocytes, massively enlarged lymph nodes, anemia, or thrombocytopenia.  The patient currently does not meet any criteria necessary to start treatment.  As such I would recommend continued close observation of his blood counts and symptoms.  The patient knows to call the clinic with any issues involving fevers, chills, sweats, sudden weight loss, or rapidly enlarging lymph node.  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   --Prognostic panel showed del 13 with positive ZAP 70 and negative IgVH --Labs today show white blood cell count 14.7, hemoglobin 13.8, MCV 88.2, platelets 142 --No indication for imaging at this time --No criteria met for beginning treatment.  Would recommend continued monitoring --Return to clinic in 6 months time   # Anemia -- Appears microcytic, concern for iron  deficiency anemia as the etiology. --Iron  labs today show ferritin of 103 with iron  sat of 17% --seen by urology and IR, planned for prostate artery embolization, but this was aborted during the procedure and only pelvic arteriography was performed. Unable to reach target artery.  No orders of the defined types were placed in this encounter.   All questions were answered. The patient knows to call the clinic with any problems, questions or concerns.  A total of more than 30 minutes were spent on this encounter with face-to-face time and non-face-to-face time, including preparing to see the patient, ordering tests and/or medications, counseling the patient and coordination of care as outlined above.    Rogerio Clay, MD Department of Hematology/Oncology [facility] Phone: [phone] Pager: [phone] Email: [email or web address]  07/29/2023 6:05 PM  Armando Lance BD, Catovsky D, Caligaris-Cappio F, Dighiero G, Dhner H, Hillmen P, Keating M, Montserrat E, Chiorazzi N, Stilgenbauer S, Rai KR, Security-[place], Eichhorst B, O'Brien S, Robak T, Seymour JF, Kipps TJ. iwCLL guidelines for diagnosis, indications for treatment, response assessment, and supportive management of CLL. Blood. 2018 Jun 21;131(25):2745-2760.  Active disease should be clearly documented to initiate therapy. At least 1 of the following criteria should be met.  1) Evidence of progressive marrow failure as manifested by the development of, or worsening of, anemia and/or thrombocytopenia. Cutoff levels of Hb <10 g/dL or platelet counts <295  109/L are generally regarded as indication for treatment. However, in some patients, platelet counts <100  109/L may remain stable over a long period; this situation does not automatically require therapeutic intervention. 2) Massive (ie, >=6 cm below the left costal margin) or progressive or symptomatic splenomegaly. 3) Massive nodes (ie, >=10 cm in longest diameter) or progressive or symptomatic lymphadenopathy. 4) Progressive lymphocytosis with an increase of >=50% over a 67-month period, or lymphocyte doubling time (LDT) <6 months. LDT can be obtained by linear regression extrapolation of absolute lymphocyte counts obtained at intervals of 2 weeks over an observation period of 2 to 3 months; patients with initial blood lymphocyte counts <30  109/L may require a longer observation period to determine the LDT. Factors contributing to lymphocytosis other than CLL (eg, infections, steroid administration) should be excluded. 5) Autoimmune complications including anemia or thrombocytopenia poorly responsive to corticosteroids. 6) Symptomatic or  functional extranodal involvement (eg, skin, kidney, lung, spine). Disease-related symptoms as defined by any of the following: Unintentional weight loss >=10% within the previous 6 months. Significant fatigue (ie, ECOG performance scale 2 or worse; cannot work or unable to perform usual activities). Fevers >=100.69F or 38.0C for 2 or more  weeks without evidence of infection. Night sweats for >=1 month without evidence of infection.

## 2023-07-31 ENCOUNTER — Ambulatory Visit: Admitting: Physical Therapy

## 2023-07-31 DIAGNOSIS — W57XXXA Bitten or stung by nonvenomous insect and other nonvenomous arthropods, initial encounter: Secondary | ICD-10-CM | POA: Diagnosis not present

## 2023-07-31 DIAGNOSIS — I10 Essential (primary) hypertension: Secondary | ICD-10-CM | POA: Diagnosis not present

## 2023-07-31 DIAGNOSIS — R600 Localized edema: Secondary | ICD-10-CM | POA: Diagnosis not present

## 2023-07-31 DIAGNOSIS — I480 Paroxysmal atrial fibrillation: Secondary | ICD-10-CM | POA: Diagnosis not present

## 2023-08-01 ENCOUNTER — Other Ambulatory Visit: Payer: Self-pay | Admitting: Adult Health

## 2023-08-02 ENCOUNTER — Ambulatory Visit: Admitting: Physical Therapy

## 2023-08-07 ENCOUNTER — Ambulatory Visit: Attending: Cardiovascular Disease | Admitting: Cardiovascular Disease

## 2023-08-07 ENCOUNTER — Encounter: Payer: Self-pay | Admitting: Physical Therapy

## 2023-08-07 ENCOUNTER — Encounter: Payer: Self-pay | Admitting: Cardiovascular Disease

## 2023-08-07 ENCOUNTER — Ambulatory Visit: Admitting: Physical Therapy

## 2023-08-07 VITALS — BP 110/60 | HR 73 | Ht 67.0 in | Wt 156.8 lb

## 2023-08-07 DIAGNOSIS — I480 Paroxysmal atrial fibrillation: Secondary | ICD-10-CM | POA: Diagnosis not present

## 2023-08-07 DIAGNOSIS — G44221 Chronic tension-type headache, intractable: Secondary | ICD-10-CM | POA: Diagnosis not present

## 2023-08-07 DIAGNOSIS — M542 Cervicalgia: Secondary | ICD-10-CM | POA: Diagnosis not present

## 2023-08-07 DIAGNOSIS — E785 Hyperlipidemia, unspecified: Secondary | ICD-10-CM | POA: Diagnosis not present

## 2023-08-07 DIAGNOSIS — R42 Dizziness and giddiness: Secondary | ICD-10-CM | POA: Diagnosis not present

## 2023-08-07 DIAGNOSIS — M6281 Muscle weakness (generalized): Secondary | ICD-10-CM | POA: Diagnosis not present

## 2023-08-07 DIAGNOSIS — I10 Essential (primary) hypertension: Secondary | ICD-10-CM

## 2023-08-07 DIAGNOSIS — I495 Sick sinus syndrome: Secondary | ICD-10-CM

## 2023-08-07 DIAGNOSIS — I519 Heart disease, unspecified: Secondary | ICD-10-CM | POA: Diagnosis not present

## 2023-08-07 DIAGNOSIS — I739 Peripheral vascular disease, unspecified: Secondary | ICD-10-CM | POA: Diagnosis not present

## 2023-08-07 DIAGNOSIS — E1169 Type 2 diabetes mellitus with other specified complication: Secondary | ICD-10-CM | POA: Diagnosis not present

## 2023-08-07 DIAGNOSIS — R293 Abnormal posture: Secondary | ICD-10-CM | POA: Diagnosis not present

## 2023-08-07 DIAGNOSIS — R2689 Other abnormalities of gait and mobility: Secondary | ICD-10-CM | POA: Diagnosis not present

## 2023-08-07 DIAGNOSIS — I2510 Atherosclerotic heart disease of native coronary artery without angina pectoris: Secondary | ICD-10-CM

## 2023-08-07 DIAGNOSIS — R2681 Unsteadiness on feet: Secondary | ICD-10-CM

## 2023-08-07 DIAGNOSIS — Z951 Presence of aortocoronary bypass graft: Secondary | ICD-10-CM

## 2023-08-07 DIAGNOSIS — R29898 Other symptoms and signs involving the musculoskeletal system: Secondary | ICD-10-CM | POA: Diagnosis not present

## 2023-08-07 MED ORDER — NITROGLYCERIN 0.4 MG SL SUBL: 0.4000 mg | SUBLINGUAL_TABLET | SUBLINGUAL | 3 refills | Status: DC | PRN

## 2023-08-07 NOTE — Assessment & Plan Note (Signed)
 History of PAD with history of left SFA intervention by myself back in 2004, multiple interventions since.  He denies claudication.

## 2023-08-07 NOTE — Therapy (Signed)
 OUTPATIENT PHYSICAL THERAPY LOWER EXTREMITY AND BALANCE TREATMENT   Patient Name: Robert Burns MRN: [id number] DOB:1940-09-10, 83 y.o., male Today's Date: 08/07/2023  END OF SESSION:  PT End of Session - 08/07/23 1142     Visit Number 8    Authorization Type [employer] MCR    Authorization Time Period 17 VISITS APPROVED FOR PT 07/03/2023-08/28/2023    Authorization - Visit Number 8    Authorization - Number of Visits 17    Progress Note Due on Visit 10    PT Start Time 1103    PT Stop Time 1145    PT Time Calculation (min) 42 min    Activity Tolerance Patient tolerated treatment well    Behavior During Therapy WFL for tasks assessed/performed             Past Medical History:  Diagnosis Date   Anemia    Anxiety    Arthritis    "all over" (11/25/2015)   CAD (coronary artery disease)    a. s/p CABG  06/2002; b. 02/01/15 PCI: DES to prox SVG to PDA, staged PCI of SVG to Diag in 02/2015; c. 04/2017 Cath/PCI: LM nl, LAD 100ost, 40m, 75d, LCX 60ost, OM2 80, RCA 100ost, RPDA 80, LIMA->LAD ok, VG->D1 patent stent, VG->RPDA patent stent, 50p, VG->OM1->OM2 90p (3.0x24 Synergy DES), 100 between OM1->OM2 (med rx).   Cancer [facility])    Right Shoulder, Left Leg- BCC, SCC, AND MELANOMA   Colon polyp    Elevated cholesterol    Epithelioid hemangioendothelioma    s/p resection of left SFA/mass with interposition 6 mm GoreTex graft 06/02/10, s/p repeat resection for positive margins 09/22/10   High blood pressure    Hyperlipidemia    Hypertension    Leg pain    OSA on CPAP    PAD (peripheral artery disease) (HCC)    a. 10/2002 L SFA PTA/BMS; b. 8/17 LE Angio: LEIA 90 (9x40 self exp stent), LSFA short segment prox occlusion (staged PTA/stenting 01/03/2016), patent mid stent, RSFA 76m (staged PTA/DEB 02/14/2016).   Presence of permanent cardiac pacemaker    Medtronic   Renal insufficiency 12/18/2022   SSS (sick sinus syndrome) (HCC)    a. s/p PPM in 2007 with gen change 04/2015 - Medtronic Adapta  ADDRL1, ser # [id number].   Type II diabetes mellitus (HCC)    Type II   Urgency of urination    Past Surgical History:  Procedure Laterality Date   CARDIAC CATHETERIZATION N/A 02/01/2015   Procedure: Left Heart Cath and Coronary Angiography;  Surgeon: Avanell Leigh, MD;  Location: [facility] INVASIVE CV LAB;  Service: Cardiovascular;  Laterality: N/A;   CARDIAC CATHETERIZATION N/A 02/01/2015   Procedure: Coronary Stent Intervention;  Surgeon: Avanell Leigh, MD;  Location: [facility] INVASIVE CV LAB;  Service: Cardiovascular;  Laterality: N/A;   CARDIAC CATHETERIZATION  06/2002   "just before bypass OR"   CARDIAC CATHETERIZATION N/A 03/01/2015   Procedure: Coronary Stent Intervention;  Surgeon: Avanell Leigh, MD;  Location: [facility] INVASIVE CV LAB;  Service: Cardiovascular;  Laterality: N/A;   CARDIAC CATHETERIZATION  02/06/2018   COLONOSCOPY     CORONARY ANGIOPLASTY     CORONARY ARTERY BYPASS GRAFT  06/2002   x5, LIMA-LAD;VG- Diag; seq VG- ramus & OM branch; VG-PDA   CORONARY STENT INTERVENTION N/A 04/12/2017   Procedure: CORONARY STENT INTERVENTION;  Surgeon: Avanell Leigh, MD;  Location: [facility] INVASIVE CV LAB;  Service: Cardiovascular;  Laterality: N/A;   CORONARY STENT INTERVENTION N/A 02/08/2018  Procedure: CORONARY STENT INTERVENTION;  Surgeon: Lucendia Rusk, MD;  Location: [facility] INVASIVE CV LAB;  Service: Cardiovascular;  Laterality: N/A;   CORONARY STENT INTERVENTION N/A 11/12/2018   Procedure: CORONARY STENT INTERVENTION;  Surgeon: Wenona Hamilton, MD;  Location: [facility] INVASIVE CV LAB;  Service: Cardiovascular;  Laterality: N/A;   CORONARY STENT INTERVENTION N/A 06/22/2020   Procedure: CORONARY STENT INTERVENTION;  Surgeon: Lucendia Rusk, MD;  Location: [facility] INVASIVE CV LAB;  Service: Cardiovascular;  Laterality: N/A;   EP IMPLANTABLE DEVICE N/A 04/23/2015   Procedure: PPM Generator Changeout;  Surgeon: Luana Rumple, MD;  Location: [facility] INVASIVE CV LAB;  Service: Cardiovascular;   Laterality: N/A;   FALSE ANEURYSM REPAIR Left 11/29/2018   Procedure: REPAIR FALSE ANEURYSM LEFT RADIAL ARTERY;  Surgeon: Mayo Speck, MD;  Location: [facility] OR;  Service: Vascular;  Laterality: Left;   FEMORAL ARTERY STENT Left ~ 2014   "taken out of my leg; couldn' catorgorize what kind so the put it under all 3"; cataroziepitheloid hemanioendotheliomau   FOOT FRACTURE SURGERY Left 1973   FRACTURE SURGERY     INSERT / REPLACE / REMOVE PACEMAKER  10/13/05   right side, medtronic Adapta   IR 3D INDEPENDENT WKST  02/26/2023   IR ANGIOGRAM PELVIS SELECTIVE OR SUPRASELECTIVE  02/26/2023   IR ANGIOGRAM SELECTIVE EACH ADDITIONAL VESSEL  02/26/2023   IR ANGIOGRAM SELECTIVE EACH ADDITIONAL VESSEL  02/26/2023   IR EMBO TUMOR ORGAN ISCHEMIA INFARCT INC GUIDE ROADMAPPING  02/26/2023   IR RADIOLOGIST EVAL & MGMT  02/02/2023   IR US  GUIDE VASC ACCESS LEFT  02/26/2023   IR US  GUIDE VASC ACCESS LEFT  02/26/2023   IR US  GUIDE VASC ACCESS LEFT  02/26/2023   KNEE HARDWARE REMOVAL Right 1950's   "3-4 months after the insertion"   KNEE SURGERY Right 1950's   "broke my lower leg; had to put pin in my knee to keep lower leg in place til it healed"   LEFT HEART CATH AND CORS/GRAFTS ANGIOGRAPHY N/A 04/12/2017   Procedure: LEFT HEART CATH AND CORS/GRAFTS ANGIOGRAPHY;  Surgeon: Avanell Leigh, MD;  Location: [facility] INVASIVE CV LAB;  Service: Cardiovascular;  Laterality: N/A;   LEFT HEART CATH AND CORS/GRAFTS ANGIOGRAPHY N/A 02/06/2018   Procedure: LEFT HEART CATH AND CORS/GRAFTS ANGIOGRAPHY;  Surgeon: Millicent Ally, MD;  Location: [facility] INVASIVE CV LAB;  Service: Cardiovascular;  Laterality: N/A;   LEFT HEART CATH AND CORS/GRAFTS ANGIOGRAPHY N/A 11/12/2018   Procedure: LEFT HEART CATH AND CORS/GRAFTS ANGIOGRAPHY;  Surgeon: Wenona Hamilton, MD;  Location: [facility] INVASIVE CV LAB;  Service: Cardiovascular;  Laterality: N/A;   LEFT HEART CATH AND CORS/GRAFTS ANGIOGRAPHY N/A 06/22/2020   Procedure: LEFT HEART CATH AND CORS/GRAFTS  ANGIOGRAPHY;  Surgeon: Lucendia Rusk, MD;  Location: [facility] INVASIVE CV LAB;  Service: Cardiovascular;  Laterality: N/A;   LEFT HEART CATH AND CORS/GRAFTS ANGIOGRAPHY N/A 12/01/2022   Procedure: LEFT HEART CATH AND CORS/GRAFTS ANGIOGRAPHY;  Surgeon: [place]az[place], Peter M, MD;  Location: [facility] INVASIVE CV LAB;  Service: Cardiovascular;  Laterality: N/A;   PERIPHERAL VASCULAR CATHETERIZATION N/A 11/25/2015   Procedure: Lower Extremity Angiography;  Surgeon: Avanell Leigh, MD;  Location: [facility] INVASIVE CV LAB;  Service: Cardiovascular;  Laterality: N/A;   PERIPHERAL VASCULAR CATHETERIZATION Left 11/25/2015   Procedure: Peripheral Vascular Intervention;  Surgeon: Avanell Leigh, MD;  Location: [facility] INVASIVE CV LAB;  Service: Cardiovascular;  Laterality: Left;  external iliac   PERIPHERAL VASCULAR CATHETERIZATION N/A 01/03/2016   Procedure: Lower Extremity Angiography;  Surgeon: Avanell Leigh, MD;  Location: [facility] INVASIVE CV LAB;  Service: Cardiovascular;  Laterality: N/A;   PERIPHERAL VASCULAR CATHETERIZATION Left 01/03/2016   Procedure: Peripheral Vascular Intervention;  Surgeon: Avanell Leigh, MD;  Location: [facility] INVASIVE CV LAB;  Service: Cardiovascular;  Laterality: Left;  SFA   PERIPHERAL VASCULAR CATHETERIZATION Right 02/14/2016   Procedure: Peripheral Vascular Atherectomy;  Surgeon: Avanell Leigh, MD;  Location: [facility] INVASIVE CV LAB;  Service: Cardiovascular;  Laterality: Right;  SFA   POPLITEAL ARTERY STENT  01/03/2016   Contralateral access with a 7 French crossover sheath (second order catheter placement)   TONSILLECTOMY AND ADENOIDECTOMY     TUMOR EXCISION Right ~ 2005   cancerous tumor removed from shoulder   TUMOR EXCISION Right ~ 2000   benign tumor removed from under shoulder   TUMOR EXCISION Left 06/02/2010   resection of Lt SFA wth interposition of Gore-Tex graft   Patient Active Problem List   Diagnosis Date Noted   Complicated UTI (urinary tract infection) 05/03/2023   Hx of CABG  12/20/2022   Urinary tract infection with hematuria 12/18/2022   Sepsis (HCC) 12/18/2022   Iron  deficiency 12/18/2022   Renal insufficiency 12/18/2022   NSTEMI (non-ST elevated myocardial infarction) (HCC) 11/30/2022   Arthritis pain 11/30/2022   BPH (benign prostatic hyperplasia) 11/30/2022   Iron  deficiency anemia due to chronic blood loss 11/15/2022   Acute encephalopathy 02/06/2022   Elevated troponin    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   Hematoma of right hip 01/10/2021   Primary osteoarthritis of right elbow 01/10/2021   Chronic anticoagulation 08/12/2019   Dyslipidemia (high LDL; low HDL) 03/05/2019   Unstable angina (HCC) 11/11/2018   Hypercholesterolemia 02/20/2018   PAF (paroxysmal atrial fibrillation) (HCC) 02/20/2018   CAD (coronary artery disease) of bypass graft 02/06/2018   CAD, multiple vessel 02/06/2018   Leukocytosis 02/16/2016   Transient hypotension 02/15/2016   Peripheral arterial disease (HCC)    Pacemaker 04/23/2015   Positive cardiac stress test    Chest pain    OSA on CPAP 11/04/2012   SSS (sick sinus syndrome), medtronic adapta    Hyperlipidemia due to type 2 diabetes mellitus (HCC)    Superficial femoral artery injury 05/09/2011   SPRAIN&STRAIN OTHER SPECIFIED SITES KNEE&LEG 01/31/2010   Type 2 diabetes mellitus with complication, without long-term current use of insulin  (HCC) 11/26/2006   Essential hypertension 11/26/2006    PCP: Tena Feeling, MD   REFERRING PROVIDER: Tena Feeling, MD   REFERRING DIAG: Z91.81 History of falls  THERAPY DIAG:  Unsteadiness on feet  Balance disorder  Muscle weakness (generalized)  Cervicalgia  Rationale for Evaluation and Treatment: Rehabilitation  ONSET DATE: October   SUBJECTIVE:   SUBJECTIVE STATEMENT: Patient reports he hasn't been feeling that well. He denies falls but is still very unsteady. He feels general fatigue, no energy and is seeing the cardiologist today. It is  worse than at eval. Left leg had swollen up so doctor put him on a water pill. Swelling has resolved. Having EKG today.   EVAL: Since October had a MI and have been in the hospital a lot (most recent 05/02/23) I've lost 10-15 pounds. He has had 2 infusions of iron . I've been fatigued a lot. My DM is kicking up. I'm wearing a monitor, but don't quite understand it. It climbs in a heartbeat to 300 and then in the morning it's down to 130. I eat oatmeal and it shoots back up to 300. (Dr.  Raju). He does not use the cane at home. Only when out. I'm losing the feeling in my feet. Gabapentin  doesn't help. Wears a lift in the R shoe due to leg length discrepancy. Some left knee pain and wears brace.    PERTINENT HISTORY: CLL (leukemia), PAD, DM, CAD, PACEMAKER, on a catheter x 5 months PAIN:  Are you having pain? No - later reports he wears a brace for L knee pain.  PRECAUTIONS: Fall and ICD/Pacemaker  RED FLAGS: None   WEIGHT BEARING RESTRICTIONS: No  FALLS:  Has patient fallen in last 6 months? Yes. Number of falls 6 tripping  LIVING ENVIRONMENT: Lives with: lives with their spouse Lives in: House/apartment Stairs: Yes: Internal: 13 steps; on left going up and External: 1 steps; no rail Has following equipment at home: Single point cane, Walker - 2 wheeled, and Environmental consultant - 4 wheeled  OCCUPATION: retired  PLOF: Independent and daughter does most of the driving, but he does drive  PATIENT GOALS: to feel less clumsy  NEXT MD VISIT: a few months  OBJECTIVE:  Note: Objective measures were completed at Evaluation unless otherwise noted.  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   MUSCLE LENGTH: Tight B gastrocs  POSTURE: rounded shoulders, forward head, and posterior pelvic tilt   LOWER EXTREMITY ROM: WFL for  tasks assessed  Active ROM Right eval Left eval  Hip flexion    Hip extension    Hip abduction    Hip adduction    Hip internal rotation    Hip external rotation    Knee flexion    Knee extension    Ankle dorsiflexion    Ankle plantarflexion    Ankle inversion    Ankle eversion     (Blank rows = not tested)  LOWER EXTREMITY MMT: ABD/ADD in sitting  MMT Right eval Left eval  Hip flexion 4- 4  Hip extension    Hip abduction 5 5  Hip adduction 5 5  Hip internal rotation    Hip external rotation    Knee flexion    Knee extension 5 5  Ankle dorsiflexion 5 5  Ankle plantarflexion    Ankle inversion    Ankle eversion     (Blank rows = not tested)   FUNCTIONAL TESTS:  5 times sit to stand: 15.12 sec (one LOB and sat back down) Timed up and go (TUG): 10.21 sec Berg Balance Scale: 44/56 37-45 significant fall risk (>80%) mCTSIB: 120/120 (mod-min sway on C4)  08/07/23 BERG 50/56 moderate risk for falls     [facility] PT Assessment - 08/07/23 0001       Berg Balance Test   Sit to Stand Able to stand without using hands and stabilize independently    Standing Unsupported Able to stand safely 2 minutes    Sitting with Back Unsupported but Feet Supported on Floor or Stool Able to sit safely and securely 2 minutes    Stand to Sit Sits safely with minimal use of hands    Transfers Able to transfer safely, minor use of hands    Standing Unsupported with Eyes Closed Able to stand 10 seconds safely    Standing Unsupported with Feet Together Able to place feet together independently and stand 1 minute safely    From Standing, Reach Forward with Outstretched Arm  Can reach confidently >25 cm (10")    From Standing Position, Pick up Object from Floor Able to pick up shoe safely and easily    From Standing Position, Turn to Look Behind Over each Shoulder Looks behind from both sides and weight shifts well    Turn 360 Degrees Able to turn 360 degrees safely in 4 seconds or less     Standing Unsupported, Alternately Place Feet on Step/Stool Able to complete 4 steps without aid or supervision    Standing Unsupported, One Foot in Front Able to plae foot ahead of the other independently and hold 30 seconds    Standing on One Leg Tries to lift leg/unable to hold 3 seconds but remains standing independently    Total Score 50              GAIT: Distance walked: 20 Assistive device utilized: Single point cane Level of assistance: Modified independence Comments: Wide BOS, B hip ER    [facility] Adult PT Treatment:                                                DATE: 08/07/2023 BERG:  Fall prevention strategies Therapeutic Exercise: Standing at counter: Heel raises x20 Alt toe raises x20 Side stepping + blue TB around thighs Squat side stepping + blue TB around thighs Neuromuscular re-ed: Airex: mREO + slow head turns/nods mREC static hold Staggered stance EO --> added slow head turns/nods Step taps to 8 inch step x 10 B Therapeutic Activity: BERG re-test  Lateral straddle stepping up/down on stepper + HHAx2 Single stance step up on stepper + HHAx1 Self Care:  Discussed fall prevention strategies and starting a walking program at home  [facility] Adult PT Treatment:                                                DATE: 07/23/2023 Therapeutic Exercise: Standing at counter: Heel raises x20 Alt toe raises x20 Side stepping + blue TB around thighs side stepping + squat from dot on floor blue TB around thighs   Neuromuscular re-ed: Airex: mREO + slow head turns/nods mREC static hold B Staggered stance on 8 inch step (more difficulty with R foot on the step)  Therapeutic Activity: Curb navigation with SPC Step together Step up and over Lateral straddle stepping up/down on stepper + HHAx2 Single stance step up on stepper + HHAx1   [facility] Adult PT Treatment:                                                DATE: 07/20/2023 Therapeutic Exercise: Standing at  counter: Heel raises x20 Toe raises x20 Squats x10 --> added GTB above knees x10 HS curls + 3#AW x15, x10 from toe tap position Hip abduction + 3#AW x20 (B)  Hip extension + 3#AW x15 (B) Neuromuscular re-ed: Standing in corner with chair in front: Airex --> REO + head turns/nods Airex --> REC static hold Solid ground --> progressing staggered stance to tandem stance, EO & slow head turns Therapeutic Activity: Reaching beyond BOS --> across midline Standing in  corner with chair in front --> reaching beyond BOS, reaching across midline, reaching high/low                                                PATIENT EDUCATION:  Education details: Updated HEP  Person educated: Patient Education method: Explanation, Demonstration, and Handouts Education comprehension: verbalized understanding and returned demonstration  HOME EXERCISE PROGRAM: Access Code: [email or web address] URL: [email or web address] Date: 07/13/2023 Prepared by: Sims Duck  Exercises - Sit to Stand  - 3 x daily - 7 x weekly - 1-2 sets - 10 reps - Supine Bridge  - 2 x daily - 7 x weekly - 1-3 sets - 10 reps - Clam with Resistance  - 1 x daily - 7 x weekly - 3 sets - 10 reps - Standing Balance in Corner with Eyes Closed  - 1 x daily - 7 x weekly - 3 sets - 10 reps - 10 sec hold - Semi-Tandem Corner Balance: Eyes Open With Head Turns  - 1 x daily - 7 x weekly - 3 sets - 10 reps - Semi-Tandem Corner Balance With Eyes Closed  - 1 x daily - 7 x weekly - 3 sets - 10 reps - Standing Hip Abduction with Resistance at Ankles and Counter Support  - 1 x daily - 7 x weekly - 3 sets - 10 reps - Standing Hip Extension with Resistance at Ankles and Counter Support  - 1 x daily - 7 x weekly - 3 sets - 10 reps  ASSESSMENT:  CLINICAL IMPRESSION: Patient has met his STG despite missing the past two weeks of therapy. He continues to report low energy levels and is seeing cardiologist today. BERG has improved to 50/56 - still  moderate fall risk. He is most challenged by step taps and SLS. Improvements were shown in semi-tandem stance balance. He continues to demonstrate potential for improvement and would benefit from continued skilled therapy to address impairments.    EVAL: Patient is a 83 y.o. male who was seen today for physical therapy evaluation and treatment for unsteadiness and a h/o falls. He scored 44/56 on the BERG indicating he is a significant fall risk (>80%) and reports 6 falls over the past 6 months. He demonstrates LE functional weakness R>L and his significantly challenged with SLS activities. He also reports decreased sensation in B feet. He uses his cane in the community, but not at home. PT advised pt he should be using an AD at all times. He also reports he walks backwards down his basement steps using a hand rail. He will benefit from skilled PT to address these deficits.    OBJECTIVE IMPAIRMENTS: decreased balance, decreased strength, decreased safety awareness, impaired flexibility, impaired sensation, postural dysfunction, and pain.   ACTIVITY LIMITATIONS: stairs and locomotion level  PARTICIPATION LIMITATIONS: meal prep, cleaning, laundry, community activity, and yard work  PERSONAL FACTORS: Age, Past/current experiences, Time since onset of injury/illness/exacerbation, and 3+ comorbidities: leukemia, iron  deficiency, DM, L knee pain  are also affecting patient's functional outcome.   REHAB POTENTIAL: Good  CLINICAL DECISION MAKING: Unstable/unpredictable  EVALUATION COMPLEXITY: High   GOALS: Goals reviewed with patient? Yes  SHORT TERM GOALS: Target date: 07/31/2023   Patient will be independent with initial HEP. Baseline:  Goal status: MET  2.  Patient will demonstrate decreased risk for falls by scoring 48/56 on the  BERG. Baseline: 44 Goal status: MET 46-51 moderate RISK FOR FALLS (>50%) 08/07/23  3.  Patient will be educated on strategies to decrease risk of falls.  Baseline:   Goal status: MET   LONG TERM GOALS: Target date: 08/28/2023  Patient will be independent with advanced/ongoing HEP to improve outcomes and carryover.  Baseline:  Goal status: INITIAL  2.  Patient will score 52 on Berg Balance test to demonstrate lower risk of falls. (MCID= 8 points) .  Baseline: 44 Goal status: INITIAL  3.  Patient will be able to step up/down curb safely with LRAD for safety with community ambulation.  Baseline:  Goal status: INITIAL   4.  Patient will demonstrate decreased 5XSTS by 2-3 seconds and no LOB showing improved functional strength. Baseline: 15.12 sec Goal status: INITIAL  5.  Patient able to climb stairs safely with one rail support.  Baseline: descends stairs backwards Goal status: INITIAL  6.  Patient will report no falls during therapy episode. Baseline:  Goal status: INITIAL   PLAN:  PT FREQUENCY: 2x/week  PT DURATION: 8 weeks  PLANNED INTERVENTIONS: 97164- PT Re-evaluation, 97110-Therapeutic exercises, 97530- Therapeutic activity, [id number]- Neuromuscular re-education, 97535- Self Care, 16109- Manual therapy, [phone]- Gait training, Patient/Family education, Balance training, Stair training, and DME instructions  PLAN FOR NEXT SESSION: how did cardiologist go? Balance --> proprioception. Step taps and SLS, Gluteus med focus L>R with general LE strength, stairs, encourage AD at all times right now. Be aware of blood sugar levels as pt says they have been inconsistent.   Jinx Mourning, PT  08/07/2023, 12:38 PM

## 2023-08-07 NOTE — Assessment & Plan Note (Signed)
 History of essential hypertension with blood pressure measured today at 110/60.  He is on metoprolol 

## 2023-08-07 NOTE — Assessment & Plan Note (Signed)
 History of sick sinus syndrome status post permanent transvenous pacemaker implantation remotely.

## 2023-08-07 NOTE — Patient Instructions (Signed)
 Medication Instructions:  Your physician recommends that you continue on your current medications as directed. Please refer to the Current Medication list given to you today.  *If you need a refill on your cardiac medications before your next appointment, please call your pharmacy*  Testing/Procedures: Your physician has requested that you have an echocardiogram. Echocardiography is a painless test that uses sound waves to create images of your heart. It provides your doctor with information about the size and shape of your heart and how well your heart's chambers and valves are working. This procedure takes approximately one hour. There are no restrictions for this procedure. Please do NOT wear cologne, perfume, aftershave, or lotions (deodorant is allowed). Please arrive 15 minutes prior to your appointment time.  Please note: We ask at that you not bring children with you during ultrasound (echo/ vascular) testing. Due to room size and safety concerns, children are not allowed in the ultrasound rooms during exams. Our front office staff cannot provide observation of children in our lobby area while testing is being conducted. An adult accompanying a patient to their appointment will only be allowed in the ultrasound room at the discretion of the ultrasound technician under special circumstances. We apologize for any inconvenience.   Follow-Up: At [facility], you and your health needs are our priority.  As part of our continuing mission to provide you with exceptional heart care, our providers are all part of one team.  This team includes your primary Cardiologist (physician) and Advanced Practice Providers or APPs (Physician Assistants and Nurse Practitioners) who all work together to provide you with the care you need, when you need it.  Your next appointment:   3 month(s)  Provider:   Marcie Sever, PA-C, Callie Goodrich, PA-C, Hao Meng, PA-C, Marlana Silvan, NP, or Katlyn West, NP          Then, Lauro Portal, MD will plan to see you again in 6 month(s).  We recommend signing up for the patient portal called "MyChart".  Sign up information is provided on this After Visit Summary.  MyChart is used to connect with patients for Virtual Visits (Telemedicine).  Patients are able to view lab/test results, encounter notes, upcoming appointments, etc.  Non-urgent messages can be sent to your provider as well.   To learn more about what you can do with MyChart, go to [email or web address].

## 2023-08-07 NOTE — Progress Notes (Signed)
 08/07/2023 Robert Burns   1940-09-13  [id number]  Primary Physician Tena Feeling, MD Primary Cardiologist: Avanell Leigh MD Bennye Bravo, [place]  HPI:  Robert Burns is a 83 y.o.  married Caucasian male, father of 2, grandfather to 3 grandchildren whose wife [name] who is also a patient of mine. I last saw him in the office 01/29/2023.Robert Burns  He is accompanied by his daughter Idamae Maize today and his son was on the phone..  He has a history of CAD status post coronary artery bypass grafting March 2004 with a LIMA to his LAD, a vein to a diagonal branch, a sequential vein to a ramus and OM branch, as well as a vein to the PDA. Last functional study performed July 28, 2011, was entirely normal. 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 He had a pacemaker placed for sick sinus syndrome November 2008 followed by Dr. Alvis Ba. He has obstructive sleep apnea on CPAP. He complain of left thigh pain and I angiogram'd him revealing patent arteries though he did have a space-occupying lesion which was removed by Dr. Timm Foot with placement of an interposition 6-mm Gore-Tex graft. The pathology was uncertain. 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 Dr. Levern Reader follows his lipid profile.Since I saw him back 11/07/12 he has done well.  Followup Dopplers performed in our office 09/27/12 revealed a high-grade lesion in the distal right SFA with an ABI of 0.3. His left ABI 1.1 without obstructive disease.  His most recent lower extremity Doppler Dopplers performed 9/32/16 revealed a right ABI 0.82  And a left ABI of 0.89. Over the last 3 months he's noticed anginal chest pain occurring both at rest and with exertion with left upper extremity radiation. He also complains of left lower extremity discomfort. to moderate anterolateral ischemia. Because of ongoing chest pain and a Myoview  that showed anterolateral ischemia and he underwent cardiac catheterization on 02/01/15 revealing  high grade segmental proximal right SVG disease and subtotally occluded diagonal branch SVG. He underwent stenting of his RCA SVG successfully. He does have continued chest pain although somewhat improved since his last procedure.  I brought him back for staged diagonal branch SVG intervention on 03/01/15. This was successful and since that time he denies chest pain or shortness of breath. He underwent a generator change by Dr. Alvis Ba in January for end-of-life of his Medtronic pacemaker. Since I saw him 6 months ago he developed new left calf claudication of the last 3 months. Lower extremity arterial Doppler studies performed today revealed a decline in his left ABI from 0.87 down to 0.59 with an occluded left SFA that is a new finding. 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 He had a short segment occlusion of the proximal left SFA with patent mid left SFA stent. He had 90% segmental calcified mid right SFA stenosis with three-vessel runoff bilaterally. I ended up stenting his left external iliac artery with a 9 mm x 40 mm long nitinol self-expanding  stent which improved his symptoms somewhat although he continued to have lifestyle limiting claudication. He underwent staged intervention of his left SFA on 01/03/16. I stented the entire quadrant totally occluded segment with a 6 mm x 250 mm long Viabahn  Stent. His claudication on the left  has resolved and his ABIs normalized. He now complains  of right leg medication. I did demonstrate a 90% calcified segmental mid right SFA stenosis at the time of angiography 11/25/15. He underwent diamondback orbital rotation atherectomy/drug-eluting angioplasty of his mid calcified right SFA by myself 02/14/16. This follow-up Dopplers performed 02/24/16 revealed normal ABIs bilaterally. His claudication has improved. When I  saw him in the office possibly 3 weeks ago he was complaining of fairly new onset substernal chest pain over the prior several months occurring several times a week. These were new since his RCA and diagonal branch vein graft interventions at the end of 2016. He is on good antianginal medications. A Myoview  stress test was ordered that showed subtle inferolateral ischemia and echo showed normal LV function.Robert Burns   He was admitted to the hospital 02/06/2018 for 3 days with unstable angina.  He underwent catheterization by Dr. Loetta Ringer on 02/06/2018 revealing disease in all 3 vein grafts as well as in the LAD beyond LIMA insertion, 2 days later he underwent complex intervention on his diagonal and ramus branch vein grafts with restenting as well as intervention on his LAD via LIMA insertion.  His RCA vein graft was not intervened on.  He currently denies angina or claudication.    He was admitted to the hospital with unstable angina on 11/11/2018.  Enzymes were negative.  He underwent catheterization and intervention by Dr. Alvenia Aus 11/12/2018 via the left radial approach with stenting of his proximal diagonal branch vein graft in his distal PDA vein graft.  Since discharge, his anginal symptoms have significantly improved although he still has a "heaviness feeling" in his chest.  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  This was subsequently surgically addressed by Dr. Shirley Douglas.   Dr. Alvis Ba did identify significant A. fib burden on remote interrogation of his pacemaker and he was subsequent begun on Eliquis  which was transitioned to Xarelto .  He gets occasional chest pain not requiring sublingual nitroglycerin .  His most recent lower extremity arterial Doppler studies revealed patent left iliac and SFAs bilaterally.  Carotid Dopplers performed last December showed moderate right ICA stenosis.   He was admitted to the hospital with unstable angina 06/21/2020 and underwent diagnostic coronary  angiography by Dr. Jacquelynn Matter via the femoral approach with intervention of his ramus branch vein graft and PDA vein graft with excellent result.  Since discharge on 06/24/2020 he feels clinically improved but was feeling an episode of chest pain while raking leaves although no discomfort while doing water aerobics for 45 minutes.  He was seen by Dr. Cooper Denver 11/09/2020 relating some of the symptoms of chest pain.  A Myoview  stress test was ordered on 11/12/2020 which was entirely normal.  At this point, he is on maximum antianginal medications without room to add or increase and given the paucity of symptoms I did not feel compelled to pursue a more invasive approach at this time.  He had a non-STEMI in August of last year.  non-STEMI.n.  He underwent a heart catheterization by Dr. Swaziland 12/01/2022 revealing an occluded native RCA and RCA vein graft which apparently was his "culprit vessel.  Patent LIMA to the LAD, known occluded sequential vein to ramus branch and OM and a patent vein to the diagonal branch with 80% "in-stent restenosis within the proximal vein graft stent.  It was elected to treat this medically.  His last nitroglycerin  that he took was 6 weeks ago.  He does get chest pain however on a weekly basis.  Since I  saw him 7 months ago he is remained stable.  He does have an indwelling urethral catheter probably from BPH which is causing him to have some depression.  He gets chest pain on a weekly basis and is on high dose antianginals.  He was recently found to have bilateral lower extremity edema and was placed on furosemide  with marked improvement.  A BMP was ordered by his PCP which was elevated at 237.   Current Meds  Medication Sig   atorvastatin  (LIPITOR ) 80 MG tablet Take 1 tablet (80 mg total) by mouth daily at 6 PM.   finasteride  (PROSCAR ) 5 MG tablet Take 5 mg by mouth daily.   glipiZIDE  (GLUCOTROL ) 5 MG tablet Take 5 mg by mouth daily with breakfast.    isosorbide  mononitrate (IMDUR )  60 MG 24 hr tablet Take 60 mg by mouth daily.   JARDIANCE  10 MG TABS tablet Take 1 tablet (10 mg total) by mouth daily.   metFORMIN  (GLUCOPHAGE ) 1000 MG tablet Take 1 tablet (1,000 mg total) by mouth 2 (two) times daily with a meal. Restart on 3/18.   metoprolol  tartrate (LOPRESSOR ) 50 MG tablet Take 50 mg by mouth 2 (two) times daily.   Multiple Vitamin-Folic Acid  TABS Take 1 tablet by mouth daily.   ranolazine  (RANEXA ) 500 MG 12 hr tablet Take 500 mg by mouth 2 (two) times daily.   rivaroxaban  (XARELTO ) 20 MG TABS tablet Take 1 tablet (20 mg total) by mouth daily with supper. (Patient taking differently: Take 20 mg by mouth at bedtime.)   tamsulosin  (FLOMAX ) 0.4 MG CAPS capsule Take 0.4 mg by mouth daily after supper.   VITAMIN D  PO Take 1,000 Units by mouth in the morning and at bedtime.   [DISCONTINUED] nitroGLYCERIN  (NITROSTAT ) 0.4 MG SL tablet DISSOLVE ONE TABLET UNDER THE TONGUE EVERY 5 MINUTES AS NEEDED FOR CHEST PAIN.  DO NOT EXCEED A TOTAL OF 3 DOSES IN 15 MINUTES     Allergies  Allergen Reactions   Peanut-Containing Drug Products Anaphylaxis and Other (See Comments)    Tongue swelling is severe    Ace Inhibitors Cough        Diltiazem Hcl Other (See Comments)    UNSPECIFIED REACTION     Meloxicam Other (See Comments)    GI upset    Doxycycline Other (See Comments) and Cough    Reaction of cough and runny nose   Eliquis  [Apixaban ] Other (See Comments)    dizziness   Sertraline  Hcl Other (See Comments)   Carvedilol Itching   Clonidine  Hcl Other (See Comments)    Patch only - skin irritation   Duloxetine Hcl Other (See Comments)    Urinary frequency    Social History   Socioeconomic History   Marital status: Married    Spouse name: Not on file   Number of children: 2   Years of education: Not on file   Highest education level: Not on file  Occupational History   Occupation: retired  Tobacco Use   Smoking status: Former    Types: Pipe    Quit date: 1973     Years since quitting: 52.3   Smokeless tobacco: Never   Tobacco comments:    "quit smoking in 1973"  Vaping Use   Vaping status: Never Used  Substance and Sexual Activity   Alcohol use: No   Drug use: No   Sexual activity: Not Currently  Other Topics Concern   Not on file  Social History Narrative   Not on  file   Social Drivers of Health   Financial Resource Strain: Not on file  Food Insecurity: No Food Insecurity (05/03/2023)   Hunger Vital Sign    Worried About Running Out of Food in the Last Year: Never true    Ran Out of Food in the Last Year: Never true  Transportation Needs: No Transportation Needs (05/03/2023)   PRAPARE - Administrator, Civil Service (Medical): No    Lack of Transportation (Non-Medical): No  Physical Activity: Not on file  Stress: No Stress Concern Present (12/07/2022)   Received from [facility] Occupational [facility] - Occupational Stress Questionnaire    Feeling of Stress : Not at all  Social Connections: Socially Integrated (05/03/2023)   Social Connection and Isolation Panel [NHANES]    Frequency of Communication with Friends and Family: Twice a week    Frequency of Social Gatherings with Friends and Family: Twice a week    Attends Religious Services: 1 to 4 times per year    Active Member of [employer] or Organizations: Yes    Attends Banker Meetings: 1 to 4 times per year    Marital Status: Married  Catering manager Violence: Not At Risk (05/03/2023)   Humiliation, Afraid, Rape, and Kick questionnaire    Fear of Current or Ex-Partner: No    Emotionally Abused: No    Physically Abused: No    Sexually Abused: No     Review of Systems: General: negative for chills, fever, night sweats or weight changes.  Cardiovascular: negative for chest pain, dyspnea on exertion, edema, orthopnea, palpitations, paroxysmal nocturnal dyspnea or shortness of breath Dermatological: negative for rash Respiratory:  negative for cough or wheezing Urologic: negative for hematuria Abdominal: negative for nausea, vomiting, diarrhea, bright red blood per rectum, melena, or hematemesis Neurologic: negative for visual changes, syncope, or dizziness All other systems reviewed and are otherwise negative except as noted above.    Blood pressure 110/60, pulse 73, height 5\' 7"  (1.702 m), weight 156 lb 12.8 oz (71.1 kg), SpO2 97%.  General appearance: alert and no distress Neck: no adenopathy, no carotid bruit, no JVD, supple, symmetrical, trachea midline, and thyroid  not enlarged, symmetric, no tenderness/mass/nodules Lungs: clear to auscultation bilaterally Heart: regular rate and rhythm, S1, S2 normal, no murmur, click, rub or gallop Extremities: extremities normal, atraumatic, no cyanosis or edema Pulses: Diminished pedal pulses Skin: Skin color, texture, turgor normal. No rashes or lesions Neurologic: Grossly normal  EKG not performed today      ASSESSMENT AND PLAN:   Essential hypertension History of essential hypertension with blood pressure measured today at 110/60.  He is on metoprolol   SSS (sick sinus syndrome), medtronic adapta History of sick sinus syndrome status post permanent transvenous pacemaker implantation remotely.  Hyperlipidemia due to type 2 diabetes mellitus (HCC) History of hyperlipidemia on high-dose atorvastatin  with lipid profile performed 12/01/2022 revealing total cholesterol 61, LDL of 23 and HDL 29.  CAD, multiple vessel History of CAD status post coronary artery bypass grafting March 2004 with a LIMA to his LAD, vein to a diagonal branch, Wensel vein to a ramus branch and OM branch as well as to the PDA.  He has had multiple cardiac catheterizations and interventions over the years.  His most recent cath performed by Dr. Swaziland 12/01/2022 was notable for occluded RCA, vein to the PDA, occluded vein to an OM, patent LIMA to the LAD as well as stents in the distal LAD beyond  LIMA insertion and patent vein to the diagonal branch.  He is on Imdur  and ranolazine .  He last took a nitroglycerin  6 weeks ago but does get chest pain several times a week.  At this point, I do not feel compelled to perform evaluation of this.  Peripheral arterial disease (HCC) History of PAD with history of left SFA intervention by myself back in 2004, multiple interventions since.  He denies claudication.  PAF (paroxysmal atrial fibrillation) (HCC) History of PAF on Xarelto  oral anticoagulation.  LV dysfunction His left 2D echocardiogram performed 11/30/2022 was an EF of 45 to 50% with mild to moderately dilated left ventricle.  This represents a decline in his EF from several years before.  He did notice bilateral lower extremity edema over the last several weeks which is improved with the addition of furosemide .  His PCP did check a BMP which was mildly elevated at 237.  I am going to recheck a 2D echocardiogram.  I have told him to let his prescription for furosemide  expire after 2 weeks and if he has recurrent edema we will restart him on oral furosemide .     Avanell Leigh MD [facility], [facility] 08/07/2023 4:03 PM

## 2023-08-07 NOTE — Assessment & Plan Note (Signed)
 History of CAD status post coronary artery bypass grafting March 2004 with a LIMA to his LAD, vein to a diagonal branch, Wensel vein to a ramus branch and OM branch as well as to the PDA.  He has had multiple cardiac catheterizations and interventions over the years.  His most recent cath performed by Dr. Swaziland 12/01/2022 was notable for occluded RCA, vein to the PDA, occluded vein to an OM, patent LIMA to the LAD as well as stents in the distal LAD beyond LIMA insertion and patent vein to the diagonal branch.  He is on Imdur  and ranolazine .  He last took a nitroglycerin  6 weeks ago but does get chest pain several times a week.  At this point, I do not feel compelled to perform evaluation of this.

## 2023-08-07 NOTE — Assessment & Plan Note (Signed)
 His left 2D echocardiogram performed 11/30/2022 was an EF of 45 to 50% with mild to moderately dilated left ventricle.  This represents a decline in his EF from several years before.  He did notice bilateral lower extremity edema over the last several weeks which is improved with the addition of furosemide .  His PCP did check a BMP which was mildly elevated at 237.  I am going to recheck a 2D echocardiogram.  I have told him to let his prescription for furosemide  expire after 2 weeks and if he has recurrent edema we will restart him on oral furosemide .

## 2023-08-07 NOTE — Assessment & Plan Note (Signed)
 History of hyperlipidemia on high-dose atorvastatin  with lipid profile performed 12/01/2022 revealing total cholesterol 61, LDL of 23 and HDL 29.

## 2023-08-07 NOTE — Assessment & Plan Note (Signed)
History of PAF on Xarelto oral anticoagulation. 

## 2023-08-09 ENCOUNTER — Ambulatory Visit: Attending: Internal Medicine

## 2023-08-09 ENCOUNTER — Ambulatory Visit (HOSPITAL_BASED_OUTPATIENT_CLINIC_OR_DEPARTMENT_OTHER)

## 2023-08-09 DIAGNOSIS — E785 Hyperlipidemia, unspecified: Secondary | ICD-10-CM | POA: Diagnosis not present

## 2023-08-09 DIAGNOSIS — R600 Localized edema: Secondary | ICD-10-CM | POA: Diagnosis not present

## 2023-08-09 DIAGNOSIS — Z951 Presence of aortocoronary bypass graft: Secondary | ICD-10-CM | POA: Diagnosis not present

## 2023-08-09 DIAGNOSIS — R2689 Other abnormalities of gait and mobility: Secondary | ICD-10-CM | POA: Diagnosis not present

## 2023-08-09 DIAGNOSIS — M6281 Muscle weakness (generalized): Secondary | ICD-10-CM | POA: Diagnosis not present

## 2023-08-09 DIAGNOSIS — E1169 Type 2 diabetes mellitus with other specified complication: Secondary | ICD-10-CM | POA: Diagnosis not present

## 2023-08-09 DIAGNOSIS — I10 Essential (primary) hypertension: Secondary | ICD-10-CM | POA: Diagnosis not present

## 2023-08-09 DIAGNOSIS — I2510 Atherosclerotic heart disease of native coronary artery without angina pectoris: Secondary | ICD-10-CM | POA: Diagnosis not present

## 2023-08-09 DIAGNOSIS — R2681 Unsteadiness on feet: Secondary | ICD-10-CM | POA: Insufficient documentation

## 2023-08-09 DIAGNOSIS — I495 Sick sinus syndrome: Secondary | ICD-10-CM | POA: Diagnosis not present

## 2023-08-09 LAB — ECHOCARDIOGRAM COMPLETE
AR max vel: 2.17 cm2
AV Area VTI: 2.05 cm2
AV Area mean vel: 2.01 cm2
AV Mean grad: 4 mmHg
AV Peak grad: 7.5 mmHg
AV Vena cont: 0.32 cm
Ao pk vel: 1.37 m/s
Area-P 1/2: 3.23 cm2
Calc EF: 46 %
P 1/2 time: 439 ms
S' Lateral: 4.07 cm
Single Plane A2C EF: 44.4 %
Single Plane A4C EF: 45.9 %

## 2023-08-09 NOTE — Therapy (Signed)
 OUTPATIENT PHYSICAL THERAPY LOWER EXTREMITY AND BALANCE TREATMENT   Patient Name: Robert Burns MRN: [id number] DOB:1941-03-13, 83 y.o., male Today's Date: 08/09/2023  END OF SESSION:  PT End of Session - 08/09/23 1454     Visit Number 9    Date for PT Re-Evaluation 08/28/23    Authorization Type [employer] MCR    Authorization Time Period 17 VISITS APPROVED FOR PT 07/03/2023-08/28/2023    Authorization - Visit Number 9    Authorization - Number of Visits 17    Progress Note Due on Visit 10    PT Start Time 1453    PT Stop Time 1536    PT Time Calculation (min) 43 min    Activity Tolerance Patient tolerated treatment well    Behavior During Therapy WFL for tasks assessed/performed             Past Medical History:  Diagnosis Date   Anemia    Anxiety    Arthritis    "all over" (11/25/2015)   CAD (coronary artery disease)    a. s/p CABG  06/2002; b. 02/01/15 PCI: DES to prox SVG to PDA, staged PCI of SVG to Diag in 02/2015; c. 04/2017 Cath/PCI: LM nl, LAD 100ost, 14m, 75d, LCX 60ost, OM2 80, RCA 100ost, RPDA 80, LIMA->LAD ok, VG->D1 patent stent, VG->RPDA patent stent, 50p, VG->OM1->OM2 90p (3.0x24 Synergy DES), 100 between OM1->OM2 (med rx).   Cancer [facility])    Right Shoulder, Left Leg- BCC, SCC, AND MELANOMA   Colon polyp    Elevated cholesterol    Epithelioid hemangioendothelioma    s/p resection of left SFA/mass with interposition 6 mm GoreTex graft 06/02/10, s/p repeat resection for positive margins 09/22/10   High blood pressure    Hyperlipidemia    Hypertension    Leg pain    OSA on CPAP    PAD (peripheral artery disease) (HCC)    a. 10/2002 L SFA PTA/BMS; b. 8/17 LE Angio: LEIA 90 (9x40 self exp stent), LSFA short segment prox occlusion (staged PTA/stenting 01/03/2016), patent mid stent, RSFA 39m (staged PTA/DEB 02/14/2016).   Presence of permanent cardiac pacemaker    Medtronic   Renal insufficiency 12/18/2022   SSS (sick sinus syndrome) (HCC)    a. s/p PPM in 2007 with  gen change 04/2015 - Medtronic Adapta ADDRL1, ser # [id number].   Type II diabetes mellitus (HCC)    Type II   Urgency of urination    Past Surgical History:  Procedure Laterality Date   CARDIAC CATHETERIZATION N/A 02/01/2015   Procedure: Left Heart Cath and Coronary Angiography;  Surgeon: Avanell Leigh, MD;  Location: [facility] INVASIVE CV LAB;  Service: Cardiovascular;  Laterality: N/A;   CARDIAC CATHETERIZATION N/A 02/01/2015   Procedure: Coronary Stent Intervention;  Surgeon: Avanell Leigh, MD;  Location: [facility] INVASIVE CV LAB;  Service: Cardiovascular;  Laterality: N/A;   CARDIAC CATHETERIZATION  06/2002   "just before bypass OR"   CARDIAC CATHETERIZATION N/A 03/01/2015   Procedure: Coronary Stent Intervention;  Surgeon: Avanell Leigh, MD;  Location: [facility] INVASIVE CV LAB;  Service: Cardiovascular;  Laterality: N/A;   CARDIAC CATHETERIZATION  02/06/2018   COLONOSCOPY     CORONARY ANGIOPLASTY     CORONARY ARTERY BYPASS GRAFT  06/2002   x5, LIMA-LAD;VG- Diag; seq VG- ramus & OM branch; VG-PDA   CORONARY STENT INTERVENTION N/A 04/12/2017   Procedure: CORONARY STENT INTERVENTION;  Surgeon: Avanell Leigh, MD;  Location: [facility] INVASIVE CV LAB;  Service: Cardiovascular;  Laterality: N/A;  CORONARY STENT INTERVENTION N/A 02/08/2018   Procedure: CORONARY STENT INTERVENTION;  Surgeon: Lucendia Rusk, MD;  Location: [facility] INVASIVE CV LAB;  Service: Cardiovascular;  Laterality: N/A;   CORONARY STENT INTERVENTION N/A 11/12/2018   Procedure: CORONARY STENT INTERVENTION;  Surgeon: Wenona Hamilton, MD;  Location: [facility] INVASIVE CV LAB;  Service: Cardiovascular;  Laterality: N/A;   CORONARY STENT INTERVENTION N/A 06/22/2020   Procedure: CORONARY STENT INTERVENTION;  Surgeon: Lucendia Rusk, MD;  Location: [facility] INVASIVE CV LAB;  Service: Cardiovascular;  Laterality: N/A;   EP IMPLANTABLE DEVICE N/A 04/23/2015   Procedure: PPM Generator Changeout;  Surgeon: Luana Rumple, MD;  Location: [facility] INVASIVE CV LAB;   Service: Cardiovascular;  Laterality: N/A;   FALSE ANEURYSM REPAIR Left 11/29/2018   Procedure: REPAIR FALSE ANEURYSM LEFT RADIAL ARTERY;  Surgeon: Mayo Speck, MD;  Location: [facility] OR;  Service: Vascular;  Laterality: Left;   FEMORAL ARTERY STENT Left ~ 2014   "taken out of my leg; couldn' catorgorize what kind so the put it under all 3"; cataroziepitheloid hemanioendotheliomau   FOOT FRACTURE SURGERY Left 1973   FRACTURE SURGERY     INSERT / REPLACE / REMOVE PACEMAKER  10/13/05   right side, medtronic Adapta   IR 3D INDEPENDENT WKST  02/26/2023   IR ANGIOGRAM PELVIS SELECTIVE OR SUPRASELECTIVE  02/26/2023   IR ANGIOGRAM SELECTIVE EACH ADDITIONAL VESSEL  02/26/2023   IR ANGIOGRAM SELECTIVE EACH ADDITIONAL VESSEL  02/26/2023   IR EMBO TUMOR ORGAN ISCHEMIA INFARCT INC GUIDE ROADMAPPING  02/26/2023   IR RADIOLOGIST EVAL & MGMT  02/02/2023   IR US  GUIDE VASC ACCESS LEFT  02/26/2023   IR US  GUIDE VASC ACCESS LEFT  02/26/2023   IR US  GUIDE VASC ACCESS LEFT  02/26/2023   KNEE HARDWARE REMOVAL Right 1950's   "3-4 months after the insertion"   KNEE SURGERY Right 1950's   "broke my lower leg; had to put pin in my knee to keep lower leg in place til it healed"   LEFT HEART CATH AND CORS/GRAFTS ANGIOGRAPHY N/A 04/12/2017   Procedure: LEFT HEART CATH AND CORS/GRAFTS ANGIOGRAPHY;  Surgeon: Avanell Leigh, MD;  Location: [facility] INVASIVE CV LAB;  Service: Cardiovascular;  Laterality: N/A;   LEFT HEART CATH AND CORS/GRAFTS ANGIOGRAPHY N/A 02/06/2018   Procedure: LEFT HEART CATH AND CORS/GRAFTS ANGIOGRAPHY;  Surgeon: Millicent Ally, MD;  Location: [facility] INVASIVE CV LAB;  Service: Cardiovascular;  Laterality: N/A;   LEFT HEART CATH AND CORS/GRAFTS ANGIOGRAPHY N/A 11/12/2018   Procedure: LEFT HEART CATH AND CORS/GRAFTS ANGIOGRAPHY;  Surgeon: Wenona Hamilton, MD;  Location: [facility] INVASIVE CV LAB;  Service: Cardiovascular;  Laterality: N/A;   LEFT HEART CATH AND CORS/GRAFTS ANGIOGRAPHY N/A 06/22/2020   Procedure: LEFT  HEART CATH AND CORS/GRAFTS ANGIOGRAPHY;  Surgeon: Lucendia Rusk, MD;  Location[facility] INVASIVE CV LAB;  Service: Cardiovascular;  Laterality: N/A;   LEFT HEART CATH AND CORS/GRAFTS ANGIOGRAPHY N/A 12/01/2022   Procedure: LEFT HEART CATH AND CORS/GRAFTS ANGIOGRAPHY;  Surgeon: [place], Peter M, MD;  Location: [facility] INVASIVE CV LAB;  Service: Cardiovascular;  Laterality: N/A;   PERIPHERAL VASCULAR CATHETERIZATION N/A 11/25/2015   Procedure: Lower Extremity Angiography;  Surgeon: Avanell Leigh, MD;  Location: [facility] INVASIVE CV LAB;  Service: Cardiovascular;  Laterality: N/A;   PERIPHERAL VASCULAR CATHETERIZATION Left 11/25/2015   Procedure: Peripheral Vascular Intervention;  Surgeon: Avanell Leigh, MD;  Location: [facility] INVASIVE CV LAB;  Service: Cardiovascular;  Laterality: Left;  external iliac   PERIPHERAL VASCULAR CATHETERIZATION N/A  01/03/2016   Procedure: Lower Extremity Angiography;  Surgeon: Avanell Leigh, MD;  Location: [facility] INVASIVE CV LAB;  Service: Cardiovascular;  Laterality: N/A;   PERIPHERAL VASCULAR CATHETERIZATION Left 01/03/2016   Procedure: Peripheral Vascular Intervention;  Surgeon: Avanell Leigh, MD;  Location: [facility] INVASIVE CV LAB;  Service: Cardiovascular;  Laterality: Left;  SFA   PERIPHERAL VASCULAR CATHETERIZATION Right 02/14/2016   Procedure: Peripheral Vascular Atherectomy;  Surgeon: Avanell Leigh, MD;  Location: [facility] INVASIVE CV LAB;  Service: Cardiovascular;  Laterality: Right;  SFA   POPLITEAL ARTERY STENT  01/03/2016   Contralateral access with a 7 French crossover sheath (second order catheter placement)   TONSILLECTOMY AND ADENOIDECTOMY     TUMOR EXCISION Right ~ 2005   cancerous tumor removed from shoulder   TUMOR EXCISION Right ~ 2000   benign tumor removed from under shoulder   TUMOR EXCISION Left 06/02/2010   resection of Lt SFA wth interposition of Gore-Tex graft   Patient Active Problem List   Diagnosis Date Noted   LV dysfunction 08/07/2023   Complicated UTI  (urinary tract infection) 05/03/2023   Hx of CABG 12/20/2022   Urinary tract infection with hematuria 12/18/2022   Sepsis (HCC) 12/18/2022   Iron  deficiency 12/18/2022   Renal insufficiency 12/18/2022   NSTEMI (non-ST elevated myocardial infarction) (HCC) 11/30/2022   Arthritis pain 11/30/2022   BPH (benign prostatic hyperplasia) 11/30/2022   Iron  deficiency anemia due to chronic blood loss 11/15/2022   Acute encephalopathy 02/06/2022   Elevated troponin    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   Hematoma of right hip 01/10/2021   Primary osteoarthritis of right elbow 01/10/2021   Chronic anticoagulation 08/12/2019   Dyslipidemia (high LDL; low HDL) 03/05/2019   Unstable angina (HCC) 11/11/2018   Hypercholesterolemia 02/20/2018   PAF (paroxysmal atrial fibrillation) (HCC) 02/20/2018   CAD (coronary artery disease) of bypass graft 02/06/2018   CAD, multiple vessel 02/06/2018   Leukocytosis 02/16/2016   Transient hypotension 02/15/2016   Peripheral arterial disease (HCC)    Pacemaker 04/23/2015   Positive cardiac stress test    Chest pain    OSA on CPAP 11/04/2012   SSS (sick sinus syndrome), medtronic adapta    Hyperlipidemia due to type 2 diabetes mellitus (HCC)    Superficial femoral artery injury 05/09/2011   SPRAIN&STRAIN OTHER SPECIFIED SITES KNEE&LEG 01/31/2010   Type 2 diabetes mellitus with complication, without long-term current use of insulin  (HCC) 11/26/2006   Essential hypertension 11/26/2006    PCP: Tena Feeling, MD   REFERRING PROVIDER: Tena Feeling, MD   REFERRING DIAG: Z91.81 History of falls  THERAPY DIAG:  Unsteadiness on feet  Balance disorder  Muscle weakness (generalized)  Rationale for Evaluation and Treatment: Rehabilitation  ONSET DATE: October   SUBJECTIVE:   SUBJECTIVE STATEMENT: Patient reports the swelling in legs is down after taking water pills; cardiologist told him they will continue monitor and to return in 3  months. Patient had doppler imaging down this morning and is waiting the results. Patient states he continues to have low energy and fatigue; states no falls but occasional LOB. Patient states he has been feeling lightheaded today.    EVAL: Since October had a MI and have been in the hospital a lot (most recent 05/02/23) I've lost 10-15 pounds. He has had 2 infusions of iron . I've been fatigued a lot. My DM is kicking up. I'm wearing a monitor, but don't quite understand it. It climbs in a heartbeat to 300  and then in the morning it's down to 130. I eat oatmeal and it shoots back up to 300. (Dr. Candi Chafe). He does not use the cane at home. Only when out. I'm losing the feeling in my feet. Gabapentin  doesn't help. Wears a lift in the R shoe due to leg length discrepancy. Some left knee pain and wears brace.    PERTINENT HISTORY: CLL (leukemia), PAD, DM, CAD, PACEMAKER, on a catheter x 5 months PAIN:  Are you having pain? No - later reports he wears a brace for L knee pain.  PRECAUTIONS: Fall and ICD/Pacemaker  RED FLAGS: None   WEIGHT BEARING RESTRICTIONS: No  FALLS:  Has patient fallen in last 6 months? Yes. Number of falls 6 tripping  LIVING ENVIRONMENT: Lives with: lives with their spouse Lives in: House/apartment Stairs: Yes: Internal: 13 steps; on left going up and External: 1 steps; no rail Has following equipment at home: Single point cane, Walker - 2 wheeled, and Environmental consultant - 4 wheeled  OCCUPATION: retired  PLOF: Independent and daughter does most of the driving, but he does drive  PATIENT GOALS: to feel less clumsy  NEXT MD VISIT: a few months  OBJECTIVE:  Note: Objective measures were completed at Evaluation unless otherwise noted.  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   MUSCLE LENGTH: Tight B  gastrocs  POSTURE: rounded shoulders, forward head, and posterior pelvic tilt   LOWER EXTREMITY ROM: WFL for tasks assessed  Active ROM Right eval Left eval  Hip flexion    Hip extension    Hip abduction    Hip adduction    Hip internal rotation    Hip external rotation    Knee flexion    Knee extension    Ankle dorsiflexion    Ankle plantarflexion    Ankle inversion    Ankle eversion     (Blank rows = not tested)  LOWER EXTREMITY MMT: ABD/ADD in sitting  MMT Right eval Left eval  Hip flexion 4- 4  Hip extension    Hip abduction 5 5  Hip adduction 5 5  Hip internal rotation    Hip external rotation    Knee flexion    Knee extension 5 5  Ankle dorsiflexion 5 5  Ankle plantarflexion    Ankle inversion    Ankle eversion     (Blank rows = not tested)   FUNCTIONAL TESTS:  5 times sit to stand: 15.12 sec (one LOB and sat back down) Timed up and go (TUG): 10.21 sec Berg Balance Scale: 44/56 37-45 significant fall risk (>80%) mCTSIB: 120/120 (mod-min sway on C4)  08/07/23 BERG 50/56 moderate risk for falls      GAIT: Distance walked: 20 Assistive device utilized: Single point cane Level of assistance: Modified independence Comments: Wide BOS, B hip ER   [facility] Adult PT Treatment:                                                DATE: 08/09/2023 Therapeutic Exercise: Standing at counter: Alt heel/toe raises x20 Squats Side Lying: Hydrant + GTB x12  Reverse clamshell + YTB x10 Leg arcs  Neuromuscular re-ed: (Close SBA) Stepping on/off ariex  Standing on airex: Eyes closed balance 2x30" Eyes open + slow head turns/nods    [facility] Adult PT Treatment:                                                DATE: 08/07/2023 BERG:  Fall prevention strategies Therapeutic Exercise: Standing at counter: Heel raises x20 Alt toe raises x20 Side stepping + blue TB around thighs Squat side stepping + blue TB around thighs Neuromuscular re-ed: Airex: mREO + slow head  turns/nods mREC static hold Staggered stance EO --> added slow head turns/nods Step taps to 8 inch step x 10 B Therapeutic Activity: BERG re-test  Lateral straddle stepping up/down on stepper + HHAx2 Single stance step up on stepper + HHAx1 Self Care:  Discussed fall prevention strategies and starting a walking program at home  [facility] Adult PT Treatment:                                                DATE: 07/23/2023 Therapeutic Exercise: Standing at counter: Heel raises x20 Alt toe raises x20 Side stepping + blue TB around thighs side stepping + squat from dot on floor blue TB around thighs   Neuromuscular re-ed: Airex: mREO + slow head turns/nods mREC static hold B Staggered stance on 8 inch step (more difficulty with R foot on the step)  Therapeutic Activity: Curb navigation with SPC Step together Step up and over Lateral straddle stepping up/down on stepper + HHAx2 Single stance step up on stepper + HHAx1                                             PATIENT EDUCATION:  Education details: Updated HEP  Person educated: Patient Education method: Explanation, Demonstration, and Handouts Education comprehension: verbalized understanding and returned demonstration  HOME EXERCISE PROGRAM: Access Code: [email or web address] URL: [email or web address] Date: 07/13/2023 Prepared by: Sims Duck  Exercises - Sit to Stand  - 3 x daily - 7 x weekly - 1-2 sets - 10 reps - Supine Bridge  - 2 x daily - 7 x weekly - 1-3 sets - 10 reps - Clam with Resistance  - 1 x daily - 7 x weekly - 3 sets - 10 reps - Standing Balance in Corner with Eyes Closed  - 1 x daily - 7 x weekly - 3 sets - 10 reps - 10 sec hold - Semi-Tandem Corner Balance: Eyes Open With Head Turns  - 1 x daily - 7 x weekly - 3 sets - 10 reps - Semi-Tandem Corner Balance With Eyes Closed  - 1 x daily - 7 x weekly - 3 sets - 10 reps - Standing Hip Abduction with Resistance at Ankles and Counter Support  - 1 x daily - 7  x weekly - 3 sets - 10 reps - Standing Hip Extension with Resistance at Ankles and Counter Support  - 1 x daily - 7 x weekly - 3 sets - 10 reps  ASSESSMENT:  CLINICAL IMPRESSION: Patient reported feeling dizzy during standing squats and took seated rest break; blood pressure taken with reading at 156/65 mmHg. Discussion  with patient during seated rest break with water about fluctuating blood pressure readings, along with on going fluctuating blood sugar level. Blood pressure taken again with reading at 148/63 mmHg. Hip strengthening exercises continued in side lying, adding resistance for better glute activation. Moderate postural sway exhibited during balance exercises on complaint surface.   EVAL: Patient is a 83 y.o. male who was seen today for physical therapy evaluation and treatment for unsteadiness and a h/o falls. He scored 44/56 on the BERG indicating he is a significant fall risk (>80%) and reports 6 falls over the past 6 months. He demonstrates LE functional weakness R>L and his significantly challenged with SLS activities. He also reports decreased sensation in B feet. He uses his cane in the community, but not at home. PT advised pt he should be using an AD at all times. He also reports he walks backwards down his basement steps using a hand rail. He will benefit from skilled PT to address these deficits.    OBJECTIVE IMPAIRMENTS: decreased balance, decreased strength, decreased safety awareness, impaired flexibility, impaired sensation, postural dysfunction, and pain.   ACTIVITY LIMITATIONS: stairs and locomotion level  PARTICIPATION LIMITATIONS: meal prep, cleaning, laundry, community activity, and yard work  PERSONAL FACTORS: Age, Past/current experiences, Time since onset of injury/illness/exacerbation, and 3+ comorbidities: leukemia, iron  deficiency, DM, L knee pain  are also affecting patient's functional outcome.   REHAB POTENTIAL: Good  CLINICAL DECISION MAKING:  Unstable/unpredictable  EVALUATION COMPLEXITY: High   GOALS: Goals reviewed with patient? Yes  SHORT TERM GOALS: Target date: 07/31/2023   Patient will be independent with initial HEP. Baseline:  Goal status: MET  2.  Patient will demonstrate decreased risk for falls by scoring 48/56 on the BERG. Baseline: 44 Goal status: MET 46-51 moderate RISK FOR FALLS (>50%) 08/07/23  3.  Patient will be educated on strategies to decrease risk of falls.  Baseline:  Goal status: MET   LONG TERM GOALS: Target date: 08/28/2023  Patient will be independent with advanced/ongoing HEP to improve outcomes and carryover.  Baseline:  Goal status: INITIAL  2.  Patient will score 52 on Berg Balance test to demonstrate lower risk of falls. (MCID= 8 points) .  Baseline: 44 Goal status: INITIAL  3.  Patient will be able to step up/down curb safely with LRAD for safety with community ambulation.  Baseline:  Goal status: INITIAL   4.  Patient will demonstrate decreased 5XSTS by 2-3 seconds and no LOB showing improved functional strength. Baseline: 15.12 sec Goal status: INITIAL  5.  Patient able to climb stairs safely with one rail support.  Baseline: descends stairs backwards Goal status: INITIAL  6.  Patient will report no falls during therapy episode. Baseline:  Goal status: INITIAL   PLAN:  PT FREQUENCY: 2x/week  PT DURATION: 8 weeks  PLANNED INTERVENTIONS: 97164- PT Re-evaluation, 97110-Therapeutic exercises, 97530- Therapeutic activity, [id number]- Neuromuscular re-education, 97535- Self Care, 16109- Manual therapy, [phone]- Gait training, Patient/Family education, Balance training, Stair training, and DME instructions  PLAN FOR NEXT SESSION: Balance --> proprioception. Step taps and SLS, Gluteus med focus L>R with general LE strength, stairs, encourage AD at all times right now. Be aware of blood sugar levels as pt says they have been inconsistent.   Sims Duck, PTA 08/09/2023, 3:59  PM

## 2023-08-10 ENCOUNTER — Telehealth: Payer: Self-pay | Admitting: Cardiology

## 2023-08-10 DIAGNOSIS — Z5181 Encounter for therapeutic drug level monitoring: Secondary | ICD-10-CM

## 2023-08-10 DIAGNOSIS — I10 Essential (primary) hypertension: Secondary | ICD-10-CM

## 2023-08-10 NOTE — Telephone Encounter (Signed)
 Outpatient service line: CC: Lasix  refill  Patient's daughter Robert Burns is calling reporting that he has had increased peripheral edema and has been out of his Lasix .  Reportedly he was being prescribed Lasix  through his primary care provider through [facility] clinic but had been taking this Lasix  twice daily instead of once daily.  He is having no orthopnea or any shortness of breath or chest pain.  Discussed with daughter and she is going to call [facility] to try to get a refill or to confirm dose to either have them fill or call back for us  to represcribe.  Additionally, echocardiogram showing further reduction with mildly reduced EF 40 to 45% previously 45-50.  IVC was dilated so likely has some volume.  Patient likely needs BMP with up titration of Lasix  and further titration of GDMT.  Will route message to primary MD to follow-up on.

## 2023-08-13 DIAGNOSIS — Z85828 Personal history of other malignant neoplasm of skin: Secondary | ICD-10-CM | POA: Diagnosis not present

## 2023-08-13 DIAGNOSIS — L570 Actinic keratosis: Secondary | ICD-10-CM | POA: Diagnosis not present

## 2023-08-13 DIAGNOSIS — L579 Skin changes due to chronic exposure to nonionizing radiation, unspecified: Secondary | ICD-10-CM | POA: Diagnosis not present

## 2023-08-13 DIAGNOSIS — D235 Other benign neoplasm of skin of trunk: Secondary | ICD-10-CM | POA: Diagnosis not present

## 2023-08-13 DIAGNOSIS — D225 Melanocytic nevi of trunk: Secondary | ICD-10-CM | POA: Diagnosis not present

## 2023-08-13 DIAGNOSIS — L821 Other seborrheic keratosis: Secondary | ICD-10-CM | POA: Diagnosis not present

## 2023-08-13 DIAGNOSIS — S20362A Insect bite (nonvenomous) of left front wall of thorax, initial encounter: Secondary | ICD-10-CM | POA: Diagnosis not present

## 2023-08-13 DIAGNOSIS — L814 Other melanin hyperpigmentation: Secondary | ICD-10-CM | POA: Diagnosis not present

## 2023-08-13 NOTE — Telephone Encounter (Signed)
 I spoke with the pt and he is still having the bilateral lower extremity edema... he says it is not worsening but worse than his baseline... he has been taking Lasix  20 mg BID so according to Dr Arlester Ladd recommendations to double up for 3 days... he will take 40 mg BID for 3 days then back to his 20 Mg BID thereafter and will let us  know how he is doing.

## 2023-08-13 NOTE — Addendum Note (Signed)
 Addended by: Alanna Alley on: 08/13/2023 09:03 AM   Modules accepted: Orders

## 2023-08-14 ENCOUNTER — Ambulatory Visit (INDEPENDENT_AMBULATORY_CARE_PROVIDER_SITE_OTHER): Payer: Medicare HMO

## 2023-08-14 ENCOUNTER — Ambulatory Visit

## 2023-08-14 DIAGNOSIS — R2681 Unsteadiness on feet: Secondary | ICD-10-CM | POA: Diagnosis not present

## 2023-08-14 DIAGNOSIS — H34811 Central retinal vein occlusion, right eye, with macular edema: Secondary | ICD-10-CM | POA: Diagnosis not present

## 2023-08-14 DIAGNOSIS — E1151 Type 2 diabetes mellitus with diabetic peripheral angiopathy without gangrene: Secondary | ICD-10-CM | POA: Diagnosis not present

## 2023-08-14 DIAGNOSIS — H35352 Cystoid macular degeneration, left eye: Secondary | ICD-10-CM | POA: Diagnosis not present

## 2023-08-14 DIAGNOSIS — Z961 Presence of intraocular lens: Secondary | ICD-10-CM | POA: Diagnosis not present

## 2023-08-14 DIAGNOSIS — I495 Sick sinus syndrome: Secondary | ICD-10-CM

## 2023-08-14 DIAGNOSIS — R2689 Other abnormalities of gait and mobility: Secondary | ICD-10-CM

## 2023-08-14 DIAGNOSIS — M6281 Muscle weakness (generalized): Secondary | ICD-10-CM | POA: Diagnosis not present

## 2023-08-14 DIAGNOSIS — H401234 Low-tension glaucoma, bilateral, indeterminate stage: Secondary | ICD-10-CM | POA: Diagnosis not present

## 2023-08-14 DIAGNOSIS — H35372 Puckering of macula, left eye: Secondary | ICD-10-CM | POA: Diagnosis not present

## 2023-08-14 NOTE — Therapy (Addendum)
 OUTPATIENT PHYSICAL THERAPY LOWER EXTREMITY AND BALANCE TREATMENT Progress Note Reporting Period 07/03/2023 to 08/14/2023  See note below for Objective Data and Assessment of Progress/Goals.     Patient Name: Robert Burns MRN: [id number] DOB:12-13-1940, 83 y.o., male Today's Date: 08/14/2023  END OF SESSION:  PT End of Session - 08/14/23 1015     Visit Number 10    Date for PT Re-Evaluation 08/28/23    Authorization Type [employer] MCR    Authorization Time Period 17 VISITS APPROVED FOR PT 07/03/2023-08/28/2023    Authorization - Visit Number 10    Authorization - Number of Visits 17    Progress Note Due on Visit 10    PT Start Time 1016    Activity Tolerance Patient tolerated treatment well    Behavior During Therapy [facility] for tasks assessed/performed            Past Medical History:  Diagnosis Date   Anemia    Anxiety    Arthritis    "all over" (11/25/2015)   CAD (coronary artery disease)    a. s/p CABG  06/2002; b. 02/01/15 PCI: DES to prox SVG to PDA, staged PCI of SVG to Diag in 02/2015; c. 04/2017 Cath/PCI: LM nl, LAD 100ost, 5m, 75d, LCX 60ost, OM2 80, RCA 100ost, RPDA 80, LIMA->LAD ok, VG->D1 patent stent, VG->RPDA patent stent, 50p, VG->OM1->OM2 90p (3.0x24 Synergy DES), 100 between OM1->OM2 (med rx).   Cancer [facility])    Right Shoulder, Left Leg- BCC, SCC, AND MELANOMA   Colon polyp    Elevated cholesterol    Epithelioid hemangioendothelioma    s/p resection of left SFA/mass with interposition 6 mm GoreTex graft 06/02/10, s/p repeat resection for positive margins 09/22/10   High blood pressure    Hyperlipidemia    Hypertension    Leg pain    OSA on CPAP    PAD (peripheral artery disease) (HCC)    a. 10/2002 L SFA PTA/BMS; b. 8/17 LE Angio: LEIA 90 (9x40 self exp stent), LSFA short segment prox occlusion (staged PTA/stenting 01/03/2016), patent mid stent, RSFA 64m (staged PTA/DEB 02/14/2016).   Presence of permanent cardiac pacemaker    Medtronic   Renal insufficiency  12/18/2022   SSS (sick sinus syndrome) (HCC)    a. s/p PPM in 2007 with gen change 04/2015 - Medtronic Adapta ADDRL1, ser # [id number].   Type II diabetes mellitus (HCC)    Type II   Urgency of urination    Past Surgical History:  Procedure Laterality Date   CARDIAC CATHETERIZATION N/A 02/01/2015   Procedure: Left Heart Cath and Coronary Angiography;  Surgeon: Avanell Leigh, MD;  Location: [facility] INVASIVE CV LAB;  Service: Cardiovascular;  Laterality: N/A;   CARDIAC CATHETERIZATION N/A 02/01/2015   Procedure: Coronary Stent Intervention;  Surgeon: Avanell Leigh, MD;  Location: [facility] INVASIVE CV LAB;  Service: Cardiovascular;  Laterality: N/A;   CARDIAC CATHETERIZATION  06/2002   "just before bypass OR"   CARDIAC CATHETERIZATION N/A 03/01/2015   Procedure: Coronary Stent Intervention;  Surgeon: Avanell Leigh, MD;  Location: [facility] INVASIVE CV LAB;  Service: Cardiovascular;  Laterality: N/A;   CARDIAC CATHETERIZATION  02/06/2018   COLONOSCOPY     CORONARY ANGIOPLASTY     CORONARY ARTERY BYPASS GRAFT  06/2002   x5, LIMA-LAD;VG- Diag; seq VG- ramus & OM branch; VG-PDA   CORONARY STENT INTERVENTION N/A 04/12/2017   Procedure: CORONARY STENT INTERVENTION;  Surgeon: Avanell Leigh, MD;  Location: [facility] INVASIVE CV LAB;  Service: Cardiovascular;  Laterality: N/A;   CORONARY STENT INTERVENTION N/A 02/08/2018   Procedure: CORONARY STENT INTERVENTION;  Surgeon: Lucendia Rusk, MD;  Location: [facility] INVASIVE CV LAB;  Service: Cardiovascular;  Laterality: N/A;   CORONARY STENT INTERVENTION N/A 11/12/2018   Procedure: CORONARY STENT INTERVENTION;  Surgeon: Wenona Hamilton, MD;  Location: [facility] INVASIVE CV LAB;  Service: Cardiovascular;  Laterality: N/A;   CORONARY STENT INTERVENTION N/A 06/22/2020   Procedure: CORONARY STENT INTERVENTION;  Surgeon: Lucendia Rusk, MD;  Location: [facility] INVASIVE CV LAB;  Service: Cardiovascular;  Laterality: N/A;   EP IMPLANTABLE DEVICE N/A 04/23/2015   Procedure: PPM Generator  Changeout;  Surgeon: Luana Rumple, MD;  Location: [facility] INVASIVE CV LAB;  Service: Cardiovascular;  Laterality: N/A;   FALSE ANEURYSM REPAIR Left 11/29/2018   Procedure: REPAIR FALSE ANEURYSM LEFT RADIAL ARTERY;  Surgeon: Mayo Speck, MD;  Location: [facility] OR;  Service: Vascular;  Laterality: Left;   FEMORAL ARTERY STENT Left ~ 2014   "taken out of my leg; couldn' catorgorize what kind so the put it under all 3"; cataroziepitheloid hemanioendotheliomau   FOOT FRACTURE SURGERY Left 1973   FRACTURE SURGERY     INSERT / REPLACE / REMOVE PACEMAKER  10/13/05   right side, medtronic Adapta   IR 3D INDEPENDENT WKST  02/26/2023   IR ANGIOGRAM PELVIS SELECTIVE OR SUPRASELECTIVE  02/26/2023   IR ANGIOGRAM SELECTIVE EACH ADDITIONAL VESSEL  02/26/2023   IR ANGIOGRAM SELECTIVE EACH ADDITIONAL VESSEL  02/26/2023   IR EMBO TUMOR ORGAN ISCHEMIA INFARCT INC GUIDE ROADMAPPING  02/26/2023   IR RADIOLOGIST EVAL & MGMT  02/02/2023   IR US  GUIDE VASC ACCESS LEFT  02/26/2023   IR US  GUIDE VASC ACCESS LEFT  02/26/2023   IR US  GUIDE VASC ACCESS LEFT  02/26/2023   KNEE HARDWARE REMOVAL Right 1950's   "3-4 months after the insertion"   KNEE SURGERY Right 1950's   "broke my lower leg; had to put pin in my knee to keep lower leg in place til it healed"   LEFT HEART CATH AND CORS/GRAFTS ANGIOGRAPHY N/A 04/12/2017   Procedure: LEFT HEART CATH AND CORS/GRAFTS ANGIOGRAPHY;  Surgeon: Avanell Leigh, MD;  Location: [facility] INVASIVE CV LAB;  Service: Cardiovascular;  Laterality: N/A;   LEFT HEART CATH AND CORS/GRAFTS ANGIOGRAPHY N/A 02/06/2018   Procedure: LEFT HEART CATH AND CORS/GRAFTS ANGIOGRAPHY;  Surgeon: Millicent Ally, MD;  Location: [facility] INVASIVE CV LAB;  Service: Cardiovascular;  Laterality: N/A;   LEFT HEART CATH AND CORS/GRAFTS ANGIOGRAPHY N/A 11/12/2018   Procedure: LEFT HEART CATH AND CORS/GRAFTS ANGIOGRAPHY;  Surgeon: Wenona Hamilton, MD;  Location: [facility] INVASIVE CV LAB;  Service: Cardiovascular;  Laterality: N/A;   LEFT  HEART CATH AND CORS/GRAFTS ANGIOGRAPHY N/A 06/22/2020   Procedure: LEFT HEART CATH AND CORS/GRAFTS ANGIOGRAPHY;  Surgeon: Lucendia Rusk, MD;  Location: [facility] INVASIVE CV LAB;  Service: Cardiovascular;  Laterality: N/A;   LEFT HEART CATH AND CORS/GRAFTS ANGIOGRAPHY N/A 12/01/2022   Procedure: LEFT HEART CATH AND CORS/GRAFTS ANGIOGRAPHY;  Surgeon: [place]az[place], Peter M, MD;  Location: [facility] INVASIVE CV LAB;  Service: Cardiovascular;  Laterality: N/A;   PERIPHERAL VASCULAR CATHETERIZATION N/A 11/25/2015   Procedure: Lower Extremity Angiography;  Surgeon: Avanell Leigh, MD;  Location: [facility] INVASIVE CV LAB;  Service: Cardiovascular;  Laterality: N/A;   PERIPHERAL VASCULAR CATHETERIZATION Left 11/25/2015   Procedure: Peripheral Vascular Intervention;  Surgeon: Avanell Leigh, MD;  Location: [facility] INVASIVE CV LAB;  Service: Cardiovascular;  Laterality: Left;  external iliac  PERIPHERAL VASCULAR CATHETERIZATION N/A 01/03/2016   Procedure: Lower Extremity Angiography;  Surgeon: Avanell Leigh, MD;  Location: [facility] INVASIVE CV LAB;  Service: Cardiovascular;  Laterality: N/A;   PERIPHERAL VASCULAR CATHETERIZATION Left 01/03/2016   Procedure: Peripheral Vascular Intervention;  Surgeon: Avanell Leigh, MD;  Location: [facility] INVASIVE CV LAB;  Service: Cardiovascular;  Laterality: Left;  SFA   PERIPHERAL VASCULAR CATHETERIZATION Right 02/14/2016   Procedure: Peripheral Vascular Atherectomy;  Surgeon: Avanell Leigh, MD;  Location: [facility] INVASIVE CV LAB;  Service: Cardiovascular;  Laterality: Right;  SFA   POPLITEAL ARTERY STENT  01/03/2016   Contralateral access with a 7 French crossover sheath (second order catheter placement)   TONSILLECTOMY AND ADENOIDECTOMY     TUMOR EXCISION Right ~ 2005   cancerous tumor removed from shoulder   TUMOR EXCISION Right ~ 2000   benign tumor removed from under shoulder   TUMOR EXCISION Left 06/02/2010   resection of Lt SFA wth interposition of Gore-Tex graft   Patient Active Problem List    Diagnosis Date Noted   LV dysfunction 08/07/2023   Complicated UTI (urinary tract infection) 05/03/2023   Hx of CABG 12/20/2022   Urinary tract infection with hematuria 12/18/2022   Sepsis (HCC) 12/18/2022   Iron  deficiency 12/18/2022   Renal insufficiency 12/18/2022   NSTEMI (non-ST elevated myocardial infarction) (HCC) 11/30/2022   Arthritis pain 11/30/2022   BPH (benign prostatic hyperplasia) 11/30/2022   Iron  deficiency anemia due to chronic blood loss 11/15/2022   Acute encephalopathy 02/06/2022   Elevated troponin    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   Hematoma of right hip 01/10/2021   Primary osteoarthritis of right elbow 01/10/2021   Chronic anticoagulation 08/12/2019   Dyslipidemia (high LDL; low HDL) 03/05/2019   Unstable angina (HCC) 11/11/2018   Hypercholesterolemia 02/20/2018   PAF (paroxysmal atrial fibrillation) (HCC) 02/20/2018   CAD (coronary artery disease) of bypass graft 02/06/2018   CAD, multiple vessel 02/06/2018   Leukocytosis 02/16/2016   Transient hypotension 02/15/2016   Peripheral arterial disease (HCC)    Pacemaker 04/23/2015   Positive cardiac stress test    Chest pain    OSA on CPAP 11/04/2012   SSS (sick sinus syndrome), medtronic adapta    Hyperlipidemia due to type 2 diabetes mellitus (HCC)    Superficial femoral artery injury 05/09/2011   SPRAIN&STRAIN OTHER SPECIFIED SITES KNEE&LEG 01/31/2010   Type 2 diabetes mellitus with complication, without long-term current use of insulin  (HCC) 11/26/2006   Essential hypertension 11/26/2006    PCP: Tena Feeling, MD   REFERRING PROVIDER: Tena Feeling, MD   REFERRING DIAG: Z91.81 History of falls  THERAPY DIAG:  Unsteadiness on feet  Balance disorder  Muscle weakness (generalized)  Rationale for Evaluation and Treatment: Rehabilitation  ONSET DATE: October   SUBJECTIVE:   SUBJECTIVE STATEMENT: Patient reports he feels like his balance is improving since  starting therapy; states he has had no falls since last visit. Patient states he is still having swelling in bilateral legs and was directed to double up on dose by doctor to try to get swelling down. Patient states the echocardigram he had done showed his heart is "pumping less blood", states MD will continue to monitor his status.   EVAL: Since October had a MI and have been in the hospital a lot (most recent 05/02/23) I've lost 10-15 pounds. He has had 2 infusions of iron . I've been fatigued a lot. My DM is kicking up. I'm wearing a monitor, but  don't quite understand it. It climbs in a heartbeat to 300 and then in the morning it's down to 130. I eat oatmeal and it shoots back up to 300. (Dr. Candi Chafe). He does not use the cane at home. Only when out. I'm losing the feeling in my feet. Gabapentin  doesn't help. Wears a lift in the R shoe due to leg length discrepancy. Some left knee pain and wears brace.    PERTINENT HISTORY: CLL (leukemia), PAD, DM, CAD, PACEMAKER, on a catheter x 5 months PAIN:  Are you having pain? No - later reports he wears a brace for L knee pain.  PRECAUTIONS: Fall and ICD/Pacemaker  RED FLAGS: None   WEIGHT BEARING RESTRICTIONS: No  FALLS:  Has patient fallen in last 6 months? Yes. Number of falls 6 tripping  LIVING ENVIRONMENT: Lives with: lives with their spouse Lives in: House/apartment Stairs: Yes: Internal: 13 steps; on left going up and External: 1 steps; no rail Has following equipment at home: Single point cane, Walker - 2 wheeled, and Environmental consultant - 4 wheeled  OCCUPATION: retired  PLOF: Independent and daughter does most of the driving, but he does drive  PATIENT GOALS: to feel less clumsy  NEXT MD VISIT: a few months  OBJECTIVE:  Note: Objective measures were completed at Evaluation unless otherwise noted.  DIAGNOSTIC FINDINGS: n/a  PATIENT SURVEYS:  ABC scale 1330 / 1600 = 83.1 %  COGNITION: Overall cognitive status: Within functional limits for  tasks assessed     SENSATION: Reports increasing neuropathy in B feet   MUSCLE LENGTH: Tight B gastrocs  POSTURE: rounded shoulders, forward head, and posterior pelvic tilt   LOWER EXTREMITY ROM: WFL for tasks assessed  Active ROM Right eval Left eval  Hip flexion    Hip extension    Hip abduction    Hip adduction    Hip internal rotation    Hip external rotation    Knee flexion    Knee extension    Ankle dorsiflexion    Ankle plantarflexion    Ankle inversion    Ankle eversion     (Blank rows = not tested)  LOWER EXTREMITY MMT: ABD/ADD in sitting  MMT Right eval Left eval  Hip flexion 4- 4  Hip extension    Hip abduction 5 5  Hip adduction 5 5  Hip internal rotation    Hip external rotation    Knee flexion    Knee extension 5 5  Ankle dorsiflexion 5 5  Ankle plantarflexion    Ankle inversion    Ankle eversion     (Blank rows = not tested)   FUNCTIONAL TESTS:  5 times sit to stand: 15.12 sec (one LOB and sat back down) Timed up and go (TUG): 10.21 sec Berg Balance Scale: 44/56 37-45 significant fall risk (>80%) mCTSIB: 120/120 (mod-min sway on C4)  08/07/23 BERG 50/56 moderate risk for falls      GAIT: Distance walked: 20 Assistive device utilized: Single point cane Level of assistance: Modified independence Comments: Wide BOS, B hip ER   [facility] Adult PT Treatment:                                                DATE: 08/14/2023 Neuromuscular re-ed: Standing on airex (corner, chair in front): Eyes open, slow head turns/nods Eyes closed static balance Stepping on/off airex -->  close SBA Therapeutic Activity: Timed STS (see below) Berg balance test  Curb navigation: (close SBA, counter for support as needed) Stepping on/off front --> black stepping Lateral straddle reciprocal stepping --> black stepper Stair navigation --> ascend/descend long staircase + SPC & one railing   [facility] Adult PT Treatment:                                                 DATE: 08/09/2023 Therapeutic Exercise: Standing at counter: Alt heel/toe raises x20 Squats Side Lying: Hydrant + GTB x12  Reverse clamshell + YTB x10 Leg arcs  Neuromuscular re-ed: (Close SBA) Stepping on/off ariex  Standing on airex: Eyes closed balance 2x30" Eyes open + slow head turns/nods    [facility] Adult PT Treatment:                                                DATE: 08/07/2023 BERG:  Fall prevention strategies Therapeutic Exercise: Standing at counter: Heel raises x20 Alt toe raises x20 Side stepping + blue TB around thighs Squat side stepping + blue TB around thighs Neuromuscular re-ed: Airex: mREO + slow head turns/nods mREC static hold Staggered stance EO --> added slow head turns/nods Step taps to 8 inch step x 10 B Therapeutic Activity: BERG re-test  Lateral straddle stepping up/down on stepper + HHAx2 Single stance step up on stepper + HHAx1 Self Care:  Discussed fall prevention strategies and starting a walking program at home   PATIENT EDUCATION:  Education details: Updated HEP  Person educated: Patient Education method: Explanation, Demonstration, and Handouts Education comprehension: verbalized understanding and returned demonstration  HOME EXERCISE PROGRAM: Access Code: [email or web address] URL: [email or web address] Date: 07/13/2023 Prepared by: Sims Duck  Exercises - Sit to Stand  - 3 x daily - 7 x weekly - 1-2 sets - 10 reps - Supine Bridge  - 2 x daily - 7 x weekly - 1-3 sets - 10 reps - Clam with Resistance  - 1 x daily - 7 x weekly - 3 sets - 10 reps - Standing Balance in Corner with Eyes Closed  - 1 x daily - 7 x weekly - 3 sets - 10 reps - 10 sec hold - Semi-Tandem Corner Balance: Eyes Open With Head Turns  - 1 x daily - 7 x weekly - 3 sets - 10 reps - Semi-Tandem Corner Balance With Eyes Closed  - 1 x daily - 7 x weekly - 3 sets - 10 reps - Standing Hip Abduction with Resistance at Ankles and Counter Support  - 1 x daily - 7  x weekly - 3 sets - 10 reps - Standing Hip Extension with Resistance at Ankles and Counter Support  - 1 x daily - 7 x weekly - 3 sets - 10 reps  ASSESSMENT:  CLINICAL IMPRESSION: Patient able to descend stairs facing forward, however exhibited mild unsteadiness and use of SPC with one railing. Moderate increase in postural sway with transition to eyes closed standing balance on compliant surface. Patient challenged with safe foot clearance and maintaining good stride length during step on/off compliant surface for curb navigation challenge. Patient will continue to benefit from skilled therapy to address balance deficits and improve safety when navigating stairs and  community,  EVAL: Patient is a 83 y.o. male who was seen today for physical therapy evaluation and treatment for unsteadiness and a h/o falls. He scored 44/56 on the BERG indicating he is a significant fall risk (>80%) and reports 6 falls over the past 6 months. He demonstrates LE functional weakness R>L and his significantly challenged with SLS activities. He also reports decreased sensation in B feet. He uses his cane in the community, but not at home. PT advised pt he should be using an AD at all times. He also reports he walks backwards down his basement steps using a hand rail. He will benefit from skilled PT to address these deficits.    OBJECTIVE IMPAIRMENTS: decreased balance, decreased strength, decreased safety awareness, impaired flexibility, impaired sensation, postural dysfunction, and pain.   ACTIVITY LIMITATIONS: stairs and locomotion level  PARTICIPATION LIMITATIONS: meal prep, cleaning, laundry, community activity, and yard work  PERSONAL FACTORS: Age, Past/current experiences, Time since onset of injury/illness/exacerbation, and 3+ comorbidities: leukemia, iron  deficiency, DM, L knee pain  are also affecting patient's functional outcome.   REHAB POTENTIAL: Good  CLINICAL DECISION MAKING:  Unstable/unpredictable  EVALUATION COMPLEXITY: High   GOALS: Goals reviewed with patient? Yes  SHORT TERM GOALS: Target date: 07/31/2023   Patient will be independent with initial HEP. Baseline:  Goal status: MET  2.  Patient will demonstrate decreased risk for falls by scoring 48/56 on the BERG. Baseline: 44 Goal status: MET 46-51 moderate RISK FOR FALLS (>50%) 08/07/23  3.  Patient will be educated on strategies to decrease risk of falls.  Baseline:  Goal status: MET   LONG TERM GOALS: Target date: 08/28/2023  Patient will be independent with advanced/ongoing HEP to improve outcomes and carryover.  Baseline:  Goal status: INITIAL  2.  Patient will score 52 on Berg Balance test to demonstrate lower risk of falls. (MCID= 8 points) .  Baseline: 44 08/14/23: 46/56 Goal status: IN PROGRESS  3.  Patient will be able to step up/down curb safely with LRAD for safety with community ambulation.  Baseline:  Goal status: MET   4.  Patient will demonstrate decreased 5XSTS by 2-3 seconds and no LOB showing improved functional strength. Baseline: 15.12 sec 08/14/23: 14 sec Goal status: IN PROGRESS  5.  Patient able to climb stairs safely with one rail support.  Baseline: descends stairs backwards 08/14/23: one railing + SPC Goal status: IN PROGRESS  6.  Patient will report no falls during therapy episode. Baseline:  Goal status: INITIAL   PLAN:  PT FREQUENCY: 2x/week  PT DURATION: 8 weeks  PLANNED INTERVENTIONS: 97164- PT Re-evaluation, 97110-Therapeutic exercises, 97530- Therapeutic activity, [id number]- Neuromuscular re-education, 97535- Self Care, [id number]- Manual therapy, [phone]- Gait training, Patient/Family education, Balance training, Stair training, and DME instructions  PLAN FOR NEXT SESSION: Balance --> proprioception. Step taps and SLS, Gluteus med focus L>R with general LE strength, stairs, encourage AD at all times right now. Be aware of blood sugar levels as pt says they  have been inconsistent. Stepping on/off airex; foot clearnace  Sims Duck, PTA 08/14/2023, 10:16 AM

## 2023-08-15 ENCOUNTER — Telehealth: Payer: Self-pay | Admitting: Cardiovascular Disease

## 2023-08-15 LAB — CUP PACEART REMOTE DEVICE CHECK
Battery Impedance: 1240 Ohm
Battery Remaining Longevity: 56 mo
Battery Voltage: 2.78 V
Brady Statistic AP VP Percent: 3 %
Brady Statistic AP VS Percent: 94 %
Brady Statistic AS VP Percent: 0 %
Brady Statistic AS VS Percent: 3 %
Date Time Interrogation Session: 20250507091159
Implantable Lead Connection Status: 753985
Implantable Lead Connection Status: 753985
Implantable Lead Implant Date: 20070706
Implantable Lead Implant Date: 20070706
Implantable Lead Location: 753859
Implantable Lead Location: 753860
Implantable Lead Model: 5076
Implantable Lead Model: 5092
Implantable Pulse Generator Implant Date: 20170113
Lead Channel Impedance Value: 429 Ohm
Lead Channel Impedance Value: 757 Ohm
Lead Channel Pacing Threshold Amplitude: 0.875 V
Lead Channel Pacing Threshold Amplitude: 1.5 V
Lead Channel Pacing Threshold Pulse Width: 0.4 ms
Lead Channel Pacing Threshold Pulse Width: 0.4 ms
Lead Channel Setting Pacing Amplitude: 1.875
Lead Channel Setting Pacing Amplitude: 3 V
Lead Channel Setting Pacing Pulse Width: 0.4 ms
Lead Channel Setting Sensing Sensitivity: 5.6 mV
Zone Setting Status: 755011
Zone Setting Status: 755011

## 2023-08-15 MED ORDER — FUROSEMIDE 20 MG PO TABS: 20.0000 mg | ORAL_TABLET | Freq: Two times a day (BID) | ORAL | 3 refills | Status: AC

## 2023-08-15 NOTE — Telephone Encounter (Signed)
 Patient's daughter is requesting to speak with RN Lorelee Roger in regard to the medication instruction. Please advise.

## 2023-08-15 NOTE — Telephone Encounter (Signed)
 Error

## 2023-08-15 NOTE — Telephone Encounter (Signed)
 Spoke with daughter and patient has been taking Lasix  20 mg twice a day  Advised to increase to 40 mg twice a day for 3 days then back to 20 mg twice a day  No labs since increasing Lasix  to twice a day Ordered BMET to be done in about a week and refilled Lasix  as requested

## 2023-08-15 NOTE — Addendum Note (Signed)
 Addended by: Marci Setter B on: 08/15/2023 03:04 PM   Modules accepted: Orders

## 2023-08-16 ENCOUNTER — Ambulatory Visit

## 2023-08-16 DIAGNOSIS — R2681 Unsteadiness on feet: Secondary | ICD-10-CM | POA: Diagnosis not present

## 2023-08-16 DIAGNOSIS — M6281 Muscle weakness (generalized): Secondary | ICD-10-CM

## 2023-08-16 DIAGNOSIS — R2689 Other abnormalities of gait and mobility: Secondary | ICD-10-CM

## 2023-08-16 NOTE — Therapy (Signed)
 OUTPATIENT PHYSICAL THERAPY LOWER EXTREMITY AND BALANCE TREATMENT   Patient Name: Robert Burns MRN: [id number] DOB:02-06-1941, 83 y.o., male Today's Date: 08/16/2023  END OF SESSION:  PT End of Session - 08/16/23 1020     Visit Number 11    Date for PT Re-Evaluation 08/28/23    Authorization Type [employer] MCR    Authorization Time Period 17 VISITS APPROVED FOR PT 07/03/2023-08/28/2023    Authorization - Visit Number 11    Authorization - Number of Visits 17    PT Start Time 1020    PT Stop Time 1100    PT Time Calculation (min) 40 min    Activity Tolerance Patient tolerated treatment well    Behavior During Therapy WFL for tasks assessed/performed            Past Medical History:  Diagnosis Date   Anemia    Anxiety    Arthritis    "all over" (11/25/2015)   CAD (coronary artery disease)    a. s/p CABG  06/2002; b. 02/01/15 PCI: DES to prox SVG to PDA, staged PCI of SVG to Diag in 02/2015; c. 04/2017 Cath/PCI: LM nl, LAD 100ost, 66m, 75d, LCX 60ost, OM2 80, RCA 100ost, RPDA 80, LIMA->LAD ok, VG->D1 patent stent, VG->RPDA patent stent, 50p, VG->OM1->OM2 90p (3.0x24 Synergy DES), 100 between OM1->OM2 (med rx).   Cancer [facility])    Right Shoulder, Left Leg- BCC, SCC, AND MELANOMA   Colon polyp    Elevated cholesterol    Epithelioid hemangioendothelioma    s/p resection of left SFA/mass with interposition 6 mm GoreTex graft 06/02/10, s/p repeat resection for positive margins 09/22/10   High blood pressure    Hyperlipidemia    Hypertension    Leg pain    OSA on CPAP    PAD (peripheral artery disease) (HCC)    a. 10/2002 L SFA PTA/BMS; b. 8/17 LE Angio: LEIA 90 (9x40 self exp stent), LSFA short segment prox occlusion (staged PTA/stenting 01/03/2016), patent mid stent, RSFA 27m (staged PTA/DEB 02/14/2016).   Presence of permanent cardiac pacemaker    Medtronic   Renal insufficiency 12/18/2022   SSS (sick sinus syndrome) (HCC)    a. s/p PPM in 2007 with gen change 04/2015 - Medtronic  Adapta ADDRL1, ser # [id number].   Type II diabetes mellitus (HCC)    Type II   Urgency of urination    Past Surgical History:  Procedure Laterality Date   CARDIAC CATHETERIZATION N/A 02/01/2015   Procedure: Left Heart Cath and Coronary Angiography;  Surgeon: Avanell Leigh, MD;  Location: [facility] INVASIVE CV LAB;  Service: Cardiovascular;  Laterality: N/A;   CARDIAC CATHETERIZATION N/A 02/01/2015   Procedure: Coronary Stent Intervention;  Surgeon: Avanell Leigh, MD;  Location: [facility] INVASIVE CV LAB;  Service: Cardiovascular;  Laterality: N/A;   CARDIAC CATHETERIZATION  06/2002   "just before bypass OR"   CARDIAC CATHETERIZATION N/A 03/01/2015   Procedure: Coronary Stent Intervention;  Surgeon: Avanell Leigh, MD;  Location: [facility] INVASIVE CV LAB;  Service: Cardiovascular;  Laterality: N/A;   CARDIAC CATHETERIZATION  02/06/2018   COLONOSCOPY     CORONARY ANGIOPLASTY     CORONARY ARTERY BYPASS GRAFT  06/2002   x5, LIMA-LAD;VG- Diag; seq VG- ramus & OM branch; VG-PDA   CORONARY STENT INTERVENTION N/A 04/12/2017   Procedure: CORONARY STENT INTERVENTION;  Surgeon: Avanell Leigh, MD;  Location: [facility] INVASIVE CV LAB;  Service: Cardiovascular;  Laterality: N/A;   CORONARY STENT INTERVENTION N/A 02/08/2018   Procedure:  CORONARY STENT INTERVENTION;  Surgeon: Lucendia Rusk, MD;  Location: [facility] INVASIVE CV LAB;  Service: Cardiovascular;  Laterality: N/A;   CORONARY STENT INTERVENTION N/A 11/12/2018   Procedure: CORONARY STENT INTERVENTION;  Surgeon: Wenona Hamilton, MD;  Location: [facility] INVASIVE CV LAB;  Service: Cardiovascular;  Laterality: N/A;   CORONARY STENT INTERVENTION N/A 06/22/2020   Procedure: CORONARY STENT INTERVENTION;  Surgeon: Lucendia Rusk, MD;  Location: [facility] INVASIVE CV LAB;  Service: Cardiovascular;  Laterality: N/A;   EP IMPLANTABLE DEVICE N/A 04/23/2015   Procedure: PPM Generator Changeout;  Surgeon: Luana Rumple, MD;  Location: [facility] INVASIVE CV LAB;  Service: Cardiovascular;   Laterality: N/A;   FALSE ANEURYSM REPAIR Left 11/29/2018   Procedure: REPAIR FALSE ANEURYSM LEFT RADIAL ARTERY;  Surgeon: Mayo Speck, MD;  Location: [facility] OR;  Service: Vascular;  Laterality: Left;   FEMORAL ARTERY STENT Left ~ 2014   "taken out of my leg; couldn' catorgorize what kind so the put it under all 3"; cataroziepitheloid hemanioendotheliomau   FOOT FRACTURE SURGERY Left 1973   FRACTURE SURGERY     INSERT / REPLACE / REMOVE PACEMAKER  10/13/05   right side, medtronic Adapta   IR 3D INDEPENDENT WKST  02/26/2023   IR ANGIOGRAM PELVIS SELECTIVE OR SUPRASELECTIVE  02/26/2023   IR ANGIOGRAM SELECTIVE EACH ADDITIONAL VESSEL  02/26/2023   IR ANGIOGRAM SELECTIVE EACH ADDITIONAL VESSEL  02/26/2023   IR EMBO TUMOR ORGAN ISCHEMIA INFARCT INC GUIDE ROADMAPPING  02/26/2023   IR RADIOLOGIST EVAL & MGMT  02/02/2023   IR US  GUIDE VASC ACCESS LEFT  02/26/2023   IR US  GUIDE VASC ACCESS LEFT  02/26/2023   IR US  GUIDE VASC ACCESS LEFT  02/26/2023   KNEE HARDWARE REMOVAL Right 1950's   "3-4 months after the insertion"   KNEE SURGERY Right 1950's   "broke my lower leg; had to put pin in my knee to keep lower leg in place til it healed"   LEFT HEART CATH AND CORS/GRAFTS ANGIOGRAPHY N/A 04/12/2017   Procedure: LEFT HEART CATH AND CORS/GRAFTS ANGIOGRAPHY;  Surgeon: Avanell Leigh, MD;  Location: [facility] INVASIVE CV LAB;  Service: Cardiovascular;  Laterality: N/A;   LEFT HEART CATH AND CORS/GRAFTS ANGIOGRAPHY N/A 02/06/2018   Procedure: LEFT HEART CATH AND CORS/GRAFTS ANGIOGRAPHY;  Surgeon: Millicent Ally, MD;  Location: [facility] INVASIVE CV LAB;  Service: Cardiovascular;  Laterality: N/A;   LEFT HEART CATH AND CORS/GRAFTS ANGIOGRAPHY N/A 11/12/2018   Procedure: LEFT HEART CATH AND CORS/GRAFTS ANGIOGRAPHY;  Surgeon: Wenona Hamilton, MD;  Location: [facility] INVASIVE CV LAB;  Service: Cardiovascular;  Laterality: N/A;   LEFT HEART CATH AND CORS/GRAFTS ANGIOGRAPHY N/A 06/22/2020   Procedure: LEFT HEART CATH AND CORS/GRAFTS  ANGIOGRAPHY;  Surgeon: Lucendia Rusk, MD;  Location: [facility] INVASIVE CV LAB;  Service: Cardiovascular;  Laterality: N/A;   LEFT HEART CATH AND CORS/GRAFTS ANGIOGRAPHY N/A 12/01/2022   Procedure: LEFT HEART CATH AND CORS/GRAFTS ANGIOGRAPHY;  Surgeon: [place], Peter M, MD;  Location: [facility] INVASIVE CV LAB;  Service: Cardiovascular;  Laterality: N/A;   PERIPHERAL VASCULAR CATHETERIZATION N/A 11/25/2015   Procedure: Lower Extremity Angiography;  Surgeon: Avanell Leigh, MD;  Location: [facility] INVASIVE CV LAB;  Service: Cardiovascular;  Laterality: N/A;   PERIPHERAL VASCULAR CATHETERIZATION Left 11/25/2015   Procedure: Peripheral Vascular Intervention;  Surgeon: Avanell Leigh, MD;  Location: [facility] INVASIVE CV LAB;  Service: Cardiovascular;  Laterality: Left;  external iliac   PERIPHERAL VASCULAR CATHETERIZATION N/A 01/03/2016   Procedure: Lower Extremity Angiography;  Surgeon: Avanell Leigh, MD;  Location: [facility] INVASIVE CV LAB;  Service: Cardiovascular;  Laterality: N/A;   PERIPHERAL VASCULAR CATHETERIZATION Left 01/03/2016   Procedure: Peripheral Vascular Intervention;  Surgeon: Avanell Leigh, MD;  Location: [facility] INVASIVE CV LAB;  Service: Cardiovascular;  Laterality: Left;  SFA   PERIPHERAL VASCULAR CATHETERIZATION Right 02/14/2016   Procedure: Peripheral Vascular Atherectomy;  Surgeon: Avanell Leigh, MD;  Location: [facility] INVASIVE CV LAB;  Service: Cardiovascular;  Laterality: Right;  SFA   POPLITEAL ARTERY STENT  01/03/2016   Contralateral access with a 7 French crossover sheath (second order catheter placement)   TONSILLECTOMY AND ADENOIDECTOMY     TUMOR EXCISION Right ~ 2005   cancerous tumor removed from shoulder   TUMOR EXCISION Right ~ 2000   benign tumor removed from under shoulder   TUMOR EXCISION Left 06/02/2010   resection of Lt SFA wth interposition of Gore-Tex graft   Patient Active Problem List   Diagnosis Date Noted   LV dysfunction 08/07/2023   Complicated UTI (urinary tract infection)  05/03/2023   Hx of CABG 12/20/2022   Urinary tract infection with hematuria 12/18/2022   Sepsis (HCC) 12/18/2022   Iron  deficiency 12/18/2022   Renal insufficiency 12/18/2022   NSTEMI (non-ST elevated myocardial infarction) (HCC) 11/30/2022   Arthritis pain 11/30/2022   BPH (benign prostatic hyperplasia) 11/30/2022   Iron  deficiency anemia due to chronic blood loss 11/15/2022   Acute encephalopathy 02/06/2022   Elevated troponin    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   Hematoma of right hip 01/10/2021   Primary osteoarthritis of right elbow 01/10/2021   Chronic anticoagulation 08/12/2019   Dyslipidemia (high LDL; low HDL) 03/05/2019   Unstable angina (HCC) 11/11/2018   Hypercholesterolemia 02/20/2018   PAF (paroxysmal atrial fibrillation) (HCC) 02/20/2018   CAD (coronary artery disease) of bypass graft 02/06/2018   CAD, multiple vessel 02/06/2018   Leukocytosis 02/16/2016   Transient hypotension 02/15/2016   Peripheral arterial disease (HCC)    Pacemaker 04/23/2015   Positive cardiac stress test    Chest pain    OSA on CPAP 11/04/2012   SSS (sick sinus syndrome), medtronic adapta    Hyperlipidemia due to type 2 diabetes mellitus (HCC)    Superficial femoral artery injury 05/09/2011   SPRAIN&STRAIN OTHER SPECIFIED SITES KNEE&LEG 01/31/2010   Type 2 diabetes mellitus with complication, without long-term current use of insulin  (HCC) 11/26/2006   Essential hypertension 11/26/2006    PCP: Tena Feeling, MD   REFERRING PROVIDER: Tena Feeling, MD   REFERRING DIAG: Z91.81 History of falls  THERAPY DIAG:  Unsteadiness on feet  Balance disorder  Muscle weakness (generalized)  Rationale for Evaluation and Treatment: Rehabilitation  ONSET DATE: October   SUBJECTIVE:   SUBJECTIVE STATEMENT: Patient reports he has minimal dizziness/lightheadedness today; states no falls since last PT visit.  EVAL: Since October had a MI and have been in the hospital a  lot (most recent 05/02/23) I've lost 10-15 pounds. He has had 2 infusions of iron . I've been fatigued a lot. My DM is kicking up. I'm wearing a monitor, but don't quite understand it. It climbs in a heartbeat to 300 and then in the morning it's down to 130. I eat oatmeal and it shoots back up to 300. (Dr. Candi Chafe). He does not use the cane at home. Only when out. I'm losing the feeling in my feet. Gabapentin  doesn't help. Wears a lift in the R shoe due to leg length discrepancy. Some left knee  pain and wears brace.    PERTINENT HISTORY: CLL (leukemia), PAD, DM, CAD, PACEMAKER, on a catheter x 5 months PAIN:  Are you having pain? No - later reports he wears a brace for L knee pain.  PRECAUTIONS: Fall and ICD/Pacemaker  RED FLAGS: None   WEIGHT BEARING RESTRICTIONS: No  FALLS:  Has patient fallen in last 6 months? Yes. Number of falls 6 tripping  LIVING ENVIRONMENT: Lives with: lives with their spouse Lives in: House/apartment Stairs: Yes: Internal: 13 steps; on left going up and External: 1 steps; no rail Has following equipment at home: Single point cane, Walker - 2 wheeled, and Environmental consultant - 4 wheeled  OCCUPATION: retired  PLOF: Independent and daughter does most of the driving, but he does drive  PATIENT GOALS: to feel less clumsy  NEXT MD VISIT: a few months  OBJECTIVE:  Note: Objective measures were completed at Evaluation unless otherwise noted.  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   MUSCLE LENGTH: Tight B gastrocs  POSTURE: rounded shoulders, forward head, and posterior pelvic tilt   LOWER EXTREMITY ROM: WFL for tasks assessed  Active ROM Right eval Left eval  Hip flexion    Hip extension    Hip abduction    Hip adduction    Hip internal rotation    Hip external rotation    Knee  flexion    Knee extension    Ankle dorsiflexion    Ankle plantarflexion    Ankle inversion    Ankle eversion     (Blank rows = not tested)  LOWER EXTREMITY MMT: ABD/ADD in sitting  MMT Right eval Left eval  Hip flexion 4- 4  Hip extension    Hip abduction 5 5  Hip adduction 5 5  Hip internal rotation    Hip external rotation    Knee flexion    Knee extension 5 5  Ankle dorsiflexion 5 5  Ankle plantarflexion    Ankle inversion    Ankle eversion     (Blank rows = not tested)   FUNCTIONAL TESTS:  5 times sit to stand: 15.12 sec (one LOB and sat back down) Timed up and go (TUG): 10.21 sec Berg Balance Scale: 44/56 37-45 significant fall risk (>80%) mCTSIB: 120/120 (mod-min sway on C4)  08/07/23 BERG 50/56 moderate risk for falls     GAIT: Distance walked: 20 Assistive device utilized: Single point cane Level of assistance: Modified independence Comments: Wide BOS, B hip ER   [facility] Adult PT Treatment:                                                DATE: 08/16/2023 Therapeutic Exercise: Counter: Runner's lunge stretch Heel raises Heel & Toe walking Resisted side stepping + RTB crossed at ankles Fwd/bkwd diagonal stepping --> bkwd difficult Neuromuscular re-ed: Stepping on/off airex --> CGA/SBA Lateral stepping on/off airex --> CGA/SBA Therapeutic Activity: Standing squats 3x10 --> chair behind and arms reaching fwd over counter Dual tasking: Marching + naming cities in [employer] order [place] toss and catch + naming in ABC order   [facility] Adult PT Treatment:  DATE: 08/14/2023 Neuromuscular re-ed: Standing on airex (corner, chair in front): Eyes open, slow head turns/nods Eyes closed static balance Stepping on/off airex --> close SBA Therapeutic Activity: Timed STS (see below) Berg balance test  Curb navigation: (close SBA, counter for support as needed) Stepping on/off front --> black stepping Lateral straddle reciprocal  stepping --> black stepper Stair navigation --> ascend/descend long staircase + SPC & one railing   [facility] Adult PT Treatment:                                                DATE: 08/09/2023 Therapeutic Exercise: Standing at counter: Alt heel/toe raises x20 Squats Side Lying: [place] + GTB x12  Reverse clamshell + YTB x10 Leg arcs  Neuromuscular re-ed: (Close SBA) Stepping on/off ariex  Standing on airex: Eyes closed balance 2x30" Eyes open + slow head turns/nods  PATIENT EDUCATION:  Education details: Updated HEP  Person educated: Patient Education method: Explanation, Demonstration, and Handouts Education comprehension: verbalized understanding and returned demonstration  HOME EXERCISE PROGRAM: Access Code: [email or web address] URL: [email or web address] Date: 07/13/2023 Prepared by: Sims Duck  Exercises - Sit to Stand  - 3 x daily - 7 x weekly - 1-2 sets - 10 reps - Supine Bridge  - 2 x daily - 7 x weekly - 1-3 sets - 10 reps - Clam with Resistance  - 1 x daily - 7 x weekly - 3 sets - 10 reps - Standing Balance in Corner with Eyes Closed  - 1 x daily - 7 x weekly - 3 sets - 10 reps - 10 sec hold - Semi-Tandem Corner Balance: Eyes Open With Head Turns  - 1 x daily - 7 x weekly - 3 sets - 10 reps - Semi-Tandem Corner Balance With Eyes Closed  - 1 x daily - 7 x weekly - 3 sets - 10 reps - Standing Hip Abduction with Resistance at Ankles and Counter Support  - 1 x daily - 7 x weekly - 3 sets - 10 reps - Standing Hip Extension with Resistance at Ankles and Counter Support  - 1 x daily - 7 x weekly - 3 sets - 10 reps  ASSESSMENT:  CLINICAL IMPRESSION: Dual tasking activities incorporated to challenge dynamic balance with cognitive component. Patient challenged with safe foot clearance and maintaining foot clearance when stepping on/off from various surfaces types and heights; CGA and close SBA provided as needed due to instability.   EVAL: Patient is a 83 y.o. male who  was seen today for physical therapy evaluation and treatment for unsteadiness and a h/o falls. He scored 44/56 on the BERG indicating he is a significant fall risk (>80%) and reports 6 falls over the past 6 months. He demonstrates LE functional weakness R>L and his significantly challenged with SLS activities. He also reports decreased sensation in B feet. He uses his cane in the community, but not at home. PT advised pt he should be using an AD at all times. He also reports he walks backwards down his basement steps using a hand rail. He will benefit from skilled PT to address these deficits.    OBJECTIVE IMPAIRMENTS: decreased balance, decreased strength, decreased safety awareness, impaired flexibility, impaired sensation, postural dysfunction, and pain.   ACTIVITY LIMITATIONS: stairs and locomotion level  PARTICIPATION LIMITATIONS: meal prep, cleaning, laundry, community activity, and yard work  PERSONAL FACTORS: Age,  Past/current experiences, Time since onset of injury/illness/exacerbation, and 3+ comorbidities: leukemia, iron  deficiency, DM, L knee pain are also affecting patient's functional outcome.   REHAB POTENTIAL: Good  CLINICAL DECISION MAKING: Unstable/unpredictable  EVALUATION COMPLEXITY: High   GOALS: Goals reviewed with patient? Yes  SHORT TERM GOALS: Target date: 07/31/2023   Patient will be independent with initial HEP. Baseline:  Goal status: MET  2.  Patient will demonstrate decreased risk for falls by scoring 48/56 on the BERG. Baseline: 44 Goal status: MET 46-51 moderate RISK FOR FALLS (>50%) 08/07/23  3.  Patient will be educated on strategies to decrease risk of falls.  Baseline:  Goal status: MET   LONG TERM GOALS: Target date: 08/28/2023  Patient will be independent with advanced/ongoing HEP to improve outcomes and carryover.  Baseline:  Goal status: INITIAL  2.  Patient will score 52 on Berg Balance test to demonstrate lower risk of falls. (MCID= 8  points) .  Baseline: 44 08/14/23: 46/56 Goal status: IN PROGRESS  3.  Patient will be able to step up/down curb safely with LRAD for safety with community ambulation.  Baseline:  Goal status: MET   4.  Patient will demonstrate decreased 5XSTS by 2-3 seconds and no LOB showing improved functional strength. Baseline: 15.12 sec 08/14/23: 14 sec Goal status: IN PROGRESS  5.  Patient able to climb stairs safely with one rail support.  Baseline: descends stairs backwards 08/14/23: one railing + SPC Goal status: IN PROGRESS  6.  Patient will report no falls during therapy episode. Baseline:  Goal status: INITIAL   PLAN:  PT FREQUENCY: 2x/week  PT DURATION: 8 weeks  PLANNED INTERVENTIONS: 97164- PT Re-evaluation, 97110-Therapeutic exercises, 97530- Therapeutic activity, [id number]- Neuromuscular re-education, 97535- Self Care, 16109- Manual therapy, [phone]- Gait training, Patient/Family education, Balance training, Stair training, and DME instructions  PLAN FOR NEXT SESSION: Balance --> proprioception. Step taps and SLS, Gluteus med focus L>R with general LE strength, stairs, encourage AD at all times right now. Be aware of blood sugar levels as pt says they have been inconsistent. Stepping on/off airex; foot clearnace  Sims Duck, PTA 08/16/2023, 11:05 AM

## 2023-08-19 ENCOUNTER — Encounter: Payer: Self-pay | Admitting: Cardiovascular Disease

## 2023-08-20 NOTE — Therapy (Signed)
 OUTPATIENT PHYSICAL THERAPY LOWER EXTREMITY AND BALANCE TREATMENT   Patient Name: Robert Burns MRN: [id number] DOB:11-15-1940, 83 y.o., male Today's Date: 08/21/2023  END OF SESSION:  PT End of Session - 08/21/23 1101     Visit Number 12    Date for PT Re-Evaluation 08/28/23    Authorization Type [employer] MCR    Authorization Time Period 17 VISITS APPROVED FOR PT 07/03/2023-08/28/2023    Authorization - Visit Number 12    Authorization - Number of Visits 17    Progress Note Due on Visit 10    PT Start Time 1101    PT Stop Time 1139    PT Time Calculation (min) 38 min    Activity Tolerance Patient tolerated treatment well    Behavior During Therapy WFL for tasks assessed/performed             Past Medical History:  Diagnosis Date   Anemia    Anxiety    Arthritis    "all over" (11/25/2015)   CAD (coronary artery disease)    a. s/p CABG  06/2002; b. 02/01/15 PCI: DES to prox SVG to PDA, staged PCI of SVG to Diag in 02/2015; c. 04/2017 Cath/PCI: LM nl, LAD 100ost, 41m, 75d, LCX 60ost, OM2 80, RCA 100ost, RPDA 80, LIMA->LAD ok, VG->D1 patent stent, VG->RPDA patent stent, 50p, VG->OM1->OM2 90p (3.0x24 Synergy DES), 100 between OM1->OM2 (med rx).   Cancer [facility])    Right Shoulder, Left Leg- BCC, SCC, AND MELANOMA   Colon polyp    Elevated cholesterol    Epithelioid hemangioendothelioma    s/p resection of left SFA/mass with interposition 6 mm GoreTex graft 06/02/10, s/p repeat resection for positive margins 09/22/10   High blood pressure    Hyperlipidemia    Hypertension    Leg pain    OSA on CPAP    PAD (peripheral artery disease) (HCC)    a. 10/2002 L SFA PTA/BMS; b. 8/17 LE Angio: LEIA 90 (9x40 self exp stent), LSFA short segment prox occlusion (staged PTA/stenting 01/03/2016), patent mid stent, RSFA 54m (staged PTA/DEB 02/14/2016).   Presence of permanent cardiac pacemaker    Medtronic   Renal insufficiency 12/18/2022   SSS (sick sinus syndrome) (HCC)    a. s/p PPM in 2007  with gen change 04/2015 - Medtronic Adapta ADDRL1, ser # [id number].   Type II diabetes mellitus (HCC)    Type II   Urgency of urination    Past Surgical History:  Procedure Laterality Date   CARDIAC CATHETERIZATION N/A 02/01/2015   Procedure: Left Heart Cath and Coronary Angiography;  Surgeon: Avanell Leigh, MD;  Location: [facility] INVASIVE CV LAB;  Service: Cardiovascular;  Laterality: N/A;   CARDIAC CATHETERIZATION N/A 02/01/2015   Procedure: Coronary Stent Intervention;  Surgeon: Avanell Leigh, MD;  Location: [facility] INVASIVE CV LAB;  Service: Cardiovascular;  Laterality: N/A;   CARDIAC CATHETERIZATION  06/2002   "just before bypass OR"   CARDIAC CATHETERIZATION N/A 03/01/2015   Procedure: Coronary Stent Intervention;  Surgeon: Avanell Leigh, MD;  Location: [facility] INVASIVE CV LAB;  Service: Cardiovascular;  Laterality: N/A;   CARDIAC CATHETERIZATION  02/06/2018   COLONOSCOPY     CORONARY ANGIOPLASTY     CORONARY ARTERY BYPASS GRAFT  06/2002   x5, LIMA-LAD;VG- Diag; seq VG- ramus & OM branch; VG-PDA   CORONARY STENT INTERVENTION N/A 04/12/2017   Procedure: CORONARY STENT INTERVENTION;  Surgeon: Avanell Leigh, MD;  Location: [facility] INVASIVE CV LAB;  Service: Cardiovascular;  Laterality: N/A;  CORONARY STENT INTERVENTION N/A 02/08/2018   Procedure: CORONARY STENT INTERVENTION;  Surgeon: Lucendia Rusk, MD;  Location: [facility] INVASIVE CV LAB;  Service: Cardiovascular;  Laterality: N/A;   CORONARY STENT INTERVENTION N/A 11/12/2018   Procedure: CORONARY STENT INTERVENTION;  Surgeon: Wenona Hamilton, MD;  Location: [facility] INVASIVE CV LAB;  Service: Cardiovascular;  Laterality: N/A;   CORONARY STENT INTERVENTION N/A 06/22/2020   Procedure: CORONARY STENT INTERVENTION;  Surgeon: Lucendia Rusk, MD;  Location: [facility] INVASIVE CV LAB;  Service: Cardiovascular;  Laterality: N/A;   EP IMPLANTABLE DEVICE N/A 04/23/2015   Procedure: PPM Generator Changeout;  Surgeon: Luana Rumple, MD;  Location: [facility] INVASIVE CV  LAB;  Service: Cardiovascular;  Laterality: N/A;   FALSE ANEURYSM REPAIR Left 11/29/2018   Procedure: REPAIR FALSE ANEURYSM LEFT RADIAL ARTERY;  Surgeon: Mayo Speck, MD;  Location: [facility] OR;  Service: Vascular;  Laterality: Left;   FEMORAL ARTERY STENT Left ~ 2014   "taken out of my leg; couldn' catorgorize what kind so the put it under all 3"; cataroziepitheloid hemanioendotheliomau   FOOT FRACTURE SURGERY Left 1973   FRACTURE SURGERY     INSERT / REPLACE / REMOVE PACEMAKER  10/13/05   right side, medtronic Adapta   IR 3D INDEPENDENT WKST  02/26/2023   IR ANGIOGRAM PELVIS SELECTIVE OR SUPRASELECTIVE  02/26/2023   IR ANGIOGRAM SELECTIVE EACH ADDITIONAL VESSEL  02/26/2023   IR ANGIOGRAM SELECTIVE EACH ADDITIONAL VESSEL  02/26/2023   IR EMBO TUMOR ORGAN ISCHEMIA INFARCT INC GUIDE ROADMAPPING  02/26/2023   IR RADIOLOGIST EVAL & MGMT  02/02/2023   IR US  GUIDE VASC ACCESS LEFT  02/26/2023   IR US  GUIDE VASC ACCESS LEFT  02/26/2023   IR US  GUIDE VASC ACCESS LEFT  02/26/2023   KNEE HARDWARE REMOVAL Right 1950's   "3-4 months after the insertion"   KNEE SURGERY Right 1950's   "broke my lower leg; had to put pin in my knee to keep lower leg in place til it healed"   LEFT HEART CATH AND CORS/GRAFTS ANGIOGRAPHY N/A 04/12/2017   Procedure: LEFT HEART CATH AND CORS/GRAFTS ANGIOGRAPHY;  Surgeon: Avanell Leigh, MD;  Location: [facility] INVASIVE CV LAB;  Service: Cardiovascular;  Laterality: N/A;   LEFT HEART CATH AND CORS/GRAFTS ANGIOGRAPHY N/A 02/06/2018   Procedure: LEFT HEART CATH AND CORS/GRAFTS ANGIOGRAPHY;  Surgeon: Millicent Ally, MD;  Location: [facility] INVASIVE CV LAB;  Service: Cardiovascular;  Laterality: N/A;   LEFT HEART CATH AND CORS/GRAFTS ANGIOGRAPHY N/A 11/12/2018   Procedure: LEFT HEART CATH AND CORS/GRAFTS ANGIOGRAPHY;  Surgeon: Wenona Hamilton, MD;  Location: [facility] INVASIVE CV LAB;  Service: Cardiovascular;  Laterality: N/A;   LEFT HEART CATH AND CORS/GRAFTS ANGIOGRAPHY N/A 06/22/2020   Procedure:  LEFT HEART CATH AND CORS/GRAFTS ANGIOGRAPHY;  Surgeon: Lucendia Rusk, MD;  Location: [facility] INVASIVE CV LAB;  Service: Cardiovascular;  Laterality: N/A;   LEFT HEART CATH AND CORS/GRAFTS ANGIOGRAPHY N/A 12/01/2022   Procedure: LEFT HEART CATH AND CORS/GRAFTS ANGIOGRAPHY;  Surgeon: [place]az[place], Peter M, MD;  Location: [facility] INVASIVE CV LAB;  Service: Cardiovascular;  Laterality: N/A;   PERIPHERAL VASCULAR CATHETERIZATION N/A 11/25/2015   Procedure: Lower Extremity Angiography;  Surgeon: Avanell Leigh, MD;  Location: [facility] INVASIVE CV LAB;  Service: Cardiovascular;  Laterality: N/A;   PERIPHERAL VASCULAR CATHETERIZATION Left 11/25/2015   Procedure: Peripheral Vascular Intervention;  Surgeon: Avanell Leigh, MD;  Location: [facility] INVASIVE CV LAB;  Service: Cardiovascular;  Laterality: Left;  external iliac   PERIPHERAL VASCULAR CATHETERIZATION N/A  01/03/2016   Procedure: Lower Extremity Angiography;  Surgeon: Avanell Leigh, MD;  Location: [facility] INVASIVE CV LAB;  Service: Cardiovascular;  Laterality: N/A;   PERIPHERAL VASCULAR CATHETERIZATION Left 01/03/2016   Procedure: Peripheral Vascular Intervention;  Surgeon: Avanell Leigh, MD;  Location: [facility] INVASIVE CV LAB;  Service: Cardiovascular;  Laterality: Left;  SFA   PERIPHERAL VASCULAR CATHETERIZATION Right 02/14/2016   Procedure: Peripheral Vascular Atherectomy;  Surgeon: Avanell Leigh, MD;  Location: [facility] INVASIVE CV LAB;  Service: Cardiovascular;  Laterality: Right;  SFA   POPLITEAL ARTERY STENT  01/03/2016   Contralateral access with a 7 French crossover sheath (second order catheter placement)   TONSILLECTOMY AND ADENOIDECTOMY     TUMOR EXCISION Right ~ 2005   cancerous tumor removed from shoulder   TUMOR EXCISION Right ~ 2000   benign tumor removed from under shoulder   TUMOR EXCISION Left 06/02/2010   resection of Lt SFA wth interposition of Gore-Tex graft   Patient Active Problem List   Diagnosis Date Noted   LV dysfunction 08/07/2023    Complicated UTI (urinary tract infection) 05/03/2023   Hx of CABG 12/20/2022   Urinary tract infection with hematuria 12/18/2022   Sepsis (HCC) 12/18/2022   Iron  deficiency 12/18/2022   Renal insufficiency 12/18/2022   NSTEMI (non-ST elevated myocardial infarction) (HCC) 11/30/2022   Arthritis pain 11/30/2022   BPH (benign prostatic hyperplasia) 11/30/2022   Iron  deficiency anemia due to chronic blood loss 11/15/2022   Acute encephalopathy 02/06/2022   Elevated troponin    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   Hematoma of right hip 01/10/2021   Primary osteoarthritis of right elbow 01/10/2021   Chronic anticoagulation 08/12/2019   Dyslipidemia (high LDL; low HDL) 03/05/2019   Unstable angina (HCC) 11/11/2018   Hypercholesterolemia 02/20/2018   PAF (paroxysmal atrial fibrillation) (HCC) 02/20/2018   CAD (coronary artery disease) of bypass graft 02/06/2018   CAD, multiple vessel 02/06/2018   Leukocytosis 02/16/2016   Transient hypotension 02/15/2016   Peripheral arterial disease (HCC)    Pacemaker 04/23/2015   Positive cardiac stress test    Chest pain    OSA on CPAP 11/04/2012   SSS (sick sinus syndrome), medtronic adapta    Hyperlipidemia due to type 2 diabetes mellitus (HCC)    Superficial femoral artery injury 05/09/2011   SPRAIN&STRAIN OTHER SPECIFIED SITES KNEE&LEG 01/31/2010   Type 2 diabetes mellitus with complication, without long-term current use of insulin  (HCC) 11/26/2006   Essential hypertension 11/26/2006    PCP: Tena Feeling, MD   REFERRING PROVIDER: Tena Feeling, MD   REFERRING DIAG: Z91.81 History of falls  THERAPY DIAG:  Unsteadiness on feet  Balance disorder  Muscle weakness (generalized)  Rationale for Evaluation and Treatment: Rehabilitation  ONSET DATE: October   SUBJECTIVE:   SUBJECTIVE STATEMENT: I haven't fallen. I started taken a water pill 2x/day and it seems to be helping. Having blood test today. Started to walk  again. Did a 1/4 mile using the cane.   EVAL: Since October had a MI and have been in the hospital a lot (most recent 05/02/23) I've lost 10-15 pounds. He has had 2 infusions of iron . I've been fatigued a lot. My DM is kicking up. I'm wearing a monitor, but don't quite understand it. It climbs in a heartbeat to 300 and then in the morning it's down to 130. I eat oatmeal and it shoots back up to 300. (Dr. Candi Chafe). He does not use the cane at home. Only when  out. I'm losing the feeling in my feet. Gabapentin  doesn't help. Wears a lift in the R shoe due to leg length discrepancy. Some left knee pain and wears brace.    PERTINENT HISTORY: CLL (leukemia), PAD, DM, CAD, PACEMAKER, on a catheter x 5 months PAIN:  Are you having pain? No - later reports he wears a brace for L knee pain.  PRECAUTIONS: Fall and ICD/Pacemaker  RED FLAGS: None   WEIGHT BEARING RESTRICTIONS: No  FALLS:  Has patient fallen in last 6 months? Yes. Number of falls 6 tripping  LIVING ENVIRONMENT: Lives with: lives with their spouse Lives in: House/apartment Stairs: Yes: Internal: 13 steps; on left going up and External: 1 steps; no rail Has following equipment at home: Single point cane, Walker - 2 wheeled, and Environmental consultant - 4 wheeled  OCCUPATION: retired  PLOF: Independent and daughter does most of the driving, but he does drive  PATIENT GOALS: to feel less clumsy  NEXT MD VISIT: a few months  OBJECTIVE:  Note: Objective measures were completed at Evaluation unless otherwise noted.  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   MUSCLE LENGTH: Tight B gastrocs  POSTURE: rounded shoulders, forward head, and posterior pelvic tilt   LOWER EXTREMITY ROM: WFL for tasks assessed  Active ROM Right eval Left eval  Hip flexion    Hip  extension    Hip abduction    Hip adduction    Hip internal rotation    Hip external rotation    Knee flexion    Knee extension    Ankle dorsiflexion    Ankle plantarflexion    Ankle inversion    Ankle eversion     (Blank rows = not tested)  LOWER EXTREMITY MMT: ABD/ADD in sitting  MMT Right eval Left eval  Hip flexion 4- 4  Hip extension    Hip abduction 5 5  Hip adduction 5 5  Hip internal rotation    Hip external rotation    Knee flexion    Knee extension 5 5  Ankle dorsiflexion 5 5  Ankle plantarflexion    Ankle inversion    Ankle eversion     (Blank rows = not tested)   FUNCTIONAL TESTS:  5 times sit to stand: 15.12 sec (one LOB and sat back down) Timed up and go (TUG): 10.21 sec Berg Balance Scale: 44/56 37-45 significant fall risk (>80%) mCTSIB: 120/120 (mod-min sway on C4)  08/07/23 BERG 50/56 moderate risk for falls     GAIT: Distance walked: 20 Assistive device utilized: Single point cane Level of assistance: Modified independence Comments: Wide BOS, B hip ER   [facility] Adult PT Treatment:                                                DATE: 08/21/2023 Therapeutic Exercise: Counter: Runner's lunge stretch B  Heel raises x 20 with 5# wts each hand Heel & Toe walking Resisted side stepping + RTB crossed at ankles Fwd/bkwd diagonal stepping with RTB crossed at ankles using dots on floor for cueing --> bkwd difficult  Neuromuscular re-ed: Stepping on/off airex --> CGA/SBA Lateral stepping on/off airex --> CGA/SBA On Airex: around the [place]  with 5# KB normal stance --> semi tandem stance B  Therapeutic Activity: Standing squats 3x10 --> chair behind and arms reaching fwd over counter Dual tasking: Marching + naming frutits in ABC order [employer] and catch + naming in states ABC order  [facility] Adult PT Treatment:                                                DATE: 08/16/2023 Therapeutic Exercise: Counter: Runner's lunge stretch Heel raises Heel &  Toe walking Resisted side stepping + RTB crossed at ankles Fwd/bkwd diagonal stepping --> bkwd difficult Neuromuscular re-ed: Stepping on/off airex --> CGA/SBA Lateral stepping on/off airex --> CGA/SBA Therapeutic Activity: Standing squats 3x10 --> chair behind and arms reaching fwd over counter Dual tasking: Marching + naming cities in [employer] order [place] toss and catch + naming in ABC order   [facility] Adult PT Treatment:                                                DATE: 08/14/2023 Neuromuscular re-ed: Standing on airex (corner, chair in front): Eyes open, slow head turns/nods Eyes closed static balance Stepping on/off airex --> close SBA Therapeutic Activity: Timed STS (see below) Berg balance test  Curb navigation: (close SBA, counter for support as needed) Stepping on/off front --> black stepping Lateral straddle reciprocal stepping --> black stepper Stair navigation --> ascend/descend long staircase + SPC & one railing   [facility] Adult PT Treatment:                                                DATE: 08/09/2023 Therapeutic Exercise: Standing at counter: Alt heel/toe raises x20 Squats Side Lying: Hydrant + GTB x12  Reverse clamshell + YTB x10 Leg arcs  Neuromuscular re-ed: (Close SBA) Stepping on/off ariex  Standing on airex: Eyes closed balance 2x30" Eyes open + slow head turns/nods  PATIENT EDUCATION:  Education details: Updated HEP  Person educated: Patient Education method: Explanation, Demonstration, and Handouts Education comprehension: verbalized understanding and returned demonstration  HOME EXERCISE PROGRAM: Access Code: [email or web address] URL: [email or web address] Date: 07/13/2023 Prepared by: Sims Duck  Exercises - Sit to Stand  - 3 x daily - 7 x weekly - 1-2 sets - 10 reps - Supine Bridge  - 2 x daily - 7 x weekly - 1-3 sets - 10 reps - Clam with Resistance  - 1 x daily - 7 x weekly - 3 sets - 10 reps - Standing Balance in Corner with Eyes  Closed  - 1 x daily - 7 x weekly - 3 sets - 10 reps - 10 sec hold - Semi-Tandem Corner Balance: Eyes Open With Head Turns  - 1 x daily - 7 x weekly - 3 sets - 10 reps - Semi-Tandem Corner Balance With Eyes Closed  - 1 x daily - 7 x weekly - 3 sets - 10 reps - Standing Hip Abduction with Resistance at Ankles and Counter Support  - 1 x daily - 7 x weekly - 3 sets - 10 reps - Standing Hip Extension with Resistance at Ankles and  Counter Support  - 1 x daily - 7 x weekly - 3 sets - 10 reps  ASSESSMENT:  CLINICAL IMPRESSION: Alvie Jolly presents today using SPC and states he has been using it more regularly. No falls reported. He states he is doing better with stepping strategy if he loses his balance. Bwd diagonals walks and stepping down from airex with L foot caused greatest LOB today. Patient would like to D/C at next visit.  EVAL: Patient is a 83 y.o. male who was seen today for physical therapy evaluation and treatment for unsteadiness and a h/o falls. He scored 44/56 on the BERG indicating he is a significant fall risk (>80%) and reports 6 falls over the past 6 months. He demonstrates LE functional weakness R>L and his significantly challenged with SLS activities. He also reports decreased sensation in B feet. He uses his cane in the community, but not at home. PT advised pt he should be using an AD at all times. He also reports he walks backwards down his basement steps using a hand rail. He will benefit from skilled PT to address these deficits.    OBJECTIVE IMPAIRMENTS: decreased balance, decreased strength, decreased safety awareness, impaired flexibility, impaired sensation, postural dysfunction, and pain.   ACTIVITY LIMITATIONS: stairs and locomotion level  PARTICIPATION LIMITATIONS: meal prep, cleaning, laundry, community activity, and yard work  PERSONAL FACTORS: Age, Past/current experiences, Time since onset of injury/illness/exacerbation, and 3+ comorbidities: leukemia, iron  deficiency, DM, L  knee pain are also affecting patient's functional outcome.   REHAB POTENTIAL: Good  CLINICAL DECISION MAKING: Unstable/unpredictable  EVALUATION COMPLEXITY: High   GOALS: Goals reviewed with patient? Yes  SHORT TERM GOALS: Target date: 07/31/2023   Patient will be independent with initial HEP. Baseline:  Goal status: MET  2.  Patient will demonstrate decreased risk for falls by scoring 48/56 on the BERG. Baseline: 44 Goal status: MET 46-51 moderate RISK FOR FALLS (>50%) 08/07/23  3.  Patient will be educated on strategies to decrease risk of falls.  Baseline:  Goal status: MET   LONG TERM GOALS: Target date: 08/28/2023  Patient will be independent with advanced/ongoing HEP to improve outcomes and carryover.  Baseline:  Goal status: INITIAL  2.  Patient will score 52 on Berg Balance test to demonstrate lower risk of falls. (MCID= 8 points) .  Baseline: 44 08/14/23: 46/56 Goal status: IN PROGRESS  3.  Patient will be able to step up/down curb safely with LRAD for safety with community ambulation.  Baseline:  Goal status: MET   4.  Patient will demonstrate decreased 5XSTS by 2-3 seconds and no LOB showing improved functional strength. Baseline: 15.12 sec 08/14/23: 14 sec Goal status: IN PROGRESS  5.  Patient able to climb stairs safely with one rail support.  Baseline: descends stairs backwards 08/14/23: one railing + SPC Goal status: IN PROGRESS  6.  Patient will report no falls during therapy episode. Baseline:  Goal status: INITIAL   PLAN:  PT FREQUENCY: 2x/week  PT DURATION: 8 weeks  PLANNED INTERVENTIONS: 97164- PT Re-evaluation, 97110-Therapeutic exercises, 97530- Therapeutic activity, [id number]- Neuromuscular re-education, 97535- Self Care, 14782- Manual therapy, [phone]- Gait training, Patient/Family education, Balance training, Stair training, and DME instructions  PLAN FOR NEXT SESSION: Assess goals for discharge.  Jinx Mourning, PT  08/21/2023, 11:45  AM

## 2023-08-21 ENCOUNTER — Ambulatory Visit: Admitting: Physical Therapy

## 2023-08-21 ENCOUNTER — Encounter: Payer: Self-pay | Admitting: Physical Therapy

## 2023-08-21 DIAGNOSIS — I10 Essential (primary) hypertension: Secondary | ICD-10-CM | POA: Diagnosis not present

## 2023-08-21 DIAGNOSIS — R2689 Other abnormalities of gait and mobility: Secondary | ICD-10-CM | POA: Diagnosis not present

## 2023-08-21 DIAGNOSIS — R2681 Unsteadiness on feet: Secondary | ICD-10-CM | POA: Diagnosis not present

## 2023-08-21 DIAGNOSIS — M6281 Muscle weakness (generalized): Secondary | ICD-10-CM

## 2023-08-21 DIAGNOSIS — Z5181 Encounter for therapeutic drug level monitoring: Secondary | ICD-10-CM | POA: Diagnosis not present

## 2023-08-22 ENCOUNTER — Ambulatory Visit: Payer: Self-pay | Admitting: Cardiovascular Disease

## 2023-08-22 DIAGNOSIS — Z5181 Encounter for therapeutic drug level monitoring: Secondary | ICD-10-CM

## 2023-08-22 LAB — BASIC METABOLIC PANEL WITH GFR
BUN/Creatinine Ratio: 27 — ABNORMAL HIGH (ref 10–24)
BUN: 36 mg/dL — ABNORMAL HIGH (ref 8–27)
CO2: 24 mmol/L (ref 20–29)
Calcium: 9.6 mg/dL (ref 8.6–10.2)
Chloride: 103 mmol/L (ref 96–106)
Creatinine, Ser: 1.32 mg/dL — ABNORMAL HIGH (ref 0.76–1.27)
Glucose: 155 mg/dL — ABNORMAL HIGH (ref 70–99)
Potassium: 4.3 mmol/L (ref 3.5–5.2)
Sodium: 144 mmol/L (ref 134–144)
eGFR: 53 mL/min/{1.73_m2} — ABNORMAL LOW (ref >59)

## 2023-08-22 NOTE — Therapy (Signed)
 OUTPATIENT PHYSICAL THERAPY LOWER EXTREMITY AND BALANCE TREATMENT AND DISCHARGE SUMMARY   Patient Name: Robert Burns MRN: [id number] DOB:08/08/1940, 83 y.o., male Today's Date: 08/23/2023  END OF SESSION:  PT End of Session - 08/23/23 1104     Visit Number 13    Date for PT Re-Evaluation 08/28/23    Authorization Type [employer] MCR    Authorization Time Period 17 VISITS APPROVED FOR PT 07/03/2023-08/28/2023    Authorization - Visit Number 13    Authorization - Number of Visits 17    Progress Note Due on Visit 10    PT Start Time 1104    PT Stop Time 1148    PT Time Calculation (min) 44 min    Activity Tolerance Patient tolerated treatment well    Behavior During Therapy WFL for tasks assessed/performed              Past Medical History:  Diagnosis Date   Anemia    Anxiety    Arthritis    "all over" (11/25/2015)   CAD (coronary artery disease)    a. s/p CABG  06/2002; b. 02/01/15 PCI: DES to prox SVG to PDA, staged PCI of SVG to Diag in 02/2015; c. 04/2017 Cath/PCI: LM nl, LAD 100ost, 62m, 75d, LCX 60ost, OM2 80, RCA 100ost, RPDA 80, LIMA->LAD ok, VG->D1 patent stent, VG->RPDA patent stent, 50p, VG->OM1->OM2 90p (3.0x24 Synergy DES), 100 between OM1->OM2 (med rx).   Cancer [name])    Right Shoulder, Left Leg- BCC, SCC, AND MELANOMA   Colon polyp    Elevated cholesterol    Epithelioid hemangioendothelioma    s/p resection of left SFA/mass with interposition 6 mm GoreTex graft 06/02/10, s/p repeat resection for positive margins 09/22/10   High blood pressure    Hyperlipidemia    Hypertension    Leg pain    OSA on CPAP    PAD (peripheral artery disease) (HCC)    a. 10/2002 L SFA PTA/BMS; b. 8/17 LE Angio: LEIA 90 (9x40 self exp stent), LSFA short segment prox occlusion (staged PTA/stenting 01/03/2016), patent mid stent, RSFA 27m (staged PTA/DEB 02/14/2016).   Presence of permanent cardiac pacemaker    Medtronic   Renal insufficiency 12/18/2022   SSS (sick sinus syndrome) (HCC)     a. s/p PPM in 2007 with gen change 04/2015 - Medtronic Adapta ADDRL1, ser # [id number].   Type II diabetes mellitus (HCC)    Type II   Urgency of urination    Past Surgical History:  Procedure Laterality Date   CARDIAC CATHETERIZATION N/A 02/01/2015   Procedure: Left Heart Cath and Coronary Angiography;  Surgeon: Avanell Leigh, MD;  Location: [facility] INVASIVE CV LAB;  Service: Cardiovascular;  Laterality: N/A;   CARDIAC CATHETERIZATION N/A 02/01/2015   Procedure: Coronary Stent Intervention;  Surgeon: Avanell Leigh, MD;  Location: [facility] INVASIVE CV LAB;  Service: Cardiovascular;  Laterality: N/A;   CARDIAC CATHETERIZATION  06/2002   "just before bypass OR"   CARDIAC CATHETERIZATION N/A 03/01/2015   Procedure: Coronary Stent Intervention;  Surgeon: Avanell Leigh, MD;  Location: [facility] INVASIVE CV LAB;  Service: Cardiovascular;  Laterality: N/A;   CARDIAC CATHETERIZATION  02/06/2018   COLONOSCOPY     CORONARY ANGIOPLASTY     CORONARY ARTERY BYPASS GRAFT  06/2002   x5, LIMA-LAD;VG- Diag; seq VG- ramus & OM branch; VG-PDA   CORONARY STENT INTERVENTION N/A 04/12/2017   Procedure: CORONARY STENT INTERVENTION;  Surgeon: Avanell Leigh, MD;  Location: [facility] INVASIVE CV LAB;  Service:  Cardiovascular;  Laterality: N/A;   CORONARY STENT INTERVENTION N/A 02/08/2018   Procedure: CORONARY STENT INTERVENTION;  Surgeon: Lucendia Rusk, MD;  Location: [facility] INVASIVE CV LAB;  Service: Cardiovascular;  Laterality: N/A;   CORONARY STENT INTERVENTION N/A 11/12/2018   Procedure: CORONARY STENT INTERVENTION;  Surgeon: Wenona Hamilton, MD;  Location: [facility] INVASIVE CV LAB;  Service: Cardiovascular;  Laterality: N/A;   CORONARY STENT INTERVENTION N/A 06/22/2020   Procedure: CORONARY STENT INTERVENTION;  Surgeon: Lucendia Rusk, MD;  Location: [facility] INVASIVE CV LAB;  Service: Cardiovascular;  Laterality: N/A;   EP IMPLANTABLE DEVICE N/A 04/23/2015   Procedure: PPM Generator Changeout;  Surgeon: Luana Rumple, MD;   Location: [facility] INVASIVE CV LAB;  Service: Cardiovascular;  Laterality: N/A;   FALSE ANEURYSM REPAIR Left 11/29/2018   Procedure: REPAIR FALSE ANEURYSM LEFT RADIAL ARTERY;  Surgeon: Mayo Speck, MD;  Location: [facility] OR;  Service: Vascular;  Laterality: Left;   FEMORAL ARTERY STENT Left ~ 2014   "taken out of my leg; couldn' catorgorize what kind so the put it under all 3"; cataroziepitheloid hemanioendotheliomau   FOOT FRACTURE SURGERY Left 1973   FRACTURE SURGERY     INSERT / REPLACE / REMOVE PACEMAKER  10/13/05   right side, medtronic Adapta   IR 3D INDEPENDENT WKST  02/26/2023   IR ANGIOGRAM PELVIS SELECTIVE OR SUPRASELECTIVE  02/26/2023   IR ANGIOGRAM SELECTIVE EACH ADDITIONAL VESSEL  02/26/2023   IR ANGIOGRAM SELECTIVE EACH ADDITIONAL VESSEL  02/26/2023   IR EMBO TUMOR ORGAN ISCHEMIA INFARCT INC GUIDE ROADMAPPING  02/26/2023   IR RADIOLOGIST EVAL & MGMT  02/02/2023   IR US  GUIDE VASC ACCESS LEFT  02/26/2023   IR US  GUIDE VASC ACCESS LEFT  02/26/2023   IR US  GUIDE VASC ACCESS LEFT  02/26/2023   KNEE HARDWARE REMOVAL Right 1950's   "3-4 months after the insertion"   KNEE SURGERY Right 1950's   "broke my lower leg; had to put pin in my knee to keep lower leg in place til it healed"   LEFT HEART CATH AND CORS/GRAFTS ANGIOGRAPHY N/A 04/12/2017   Procedure: LEFT HEART CATH AND CORS/GRAFTS ANGIOGRAPHY;  Surgeon: Avanell Leigh, MD;  Location: [facility] INVASIVE CV LAB;  Service: Cardiovascular;  Laterality: N/A;   LEFT HEART CATH AND CORS/GRAFTS ANGIOGRAPHY N/A 02/06/2018   Procedure: LEFT HEART CATH AND CORS/GRAFTS ANGIOGRAPHY;  Surgeon: Millicent Ally, MD;  Location: [facility] INVASIVE CV LAB;  Service: Cardiovascular;  Laterality: N/A;   LEFT HEART CATH AND CORS/GRAFTS ANGIOGRAPHY N/A 11/12/2018   Procedure: LEFT HEART CATH AND CORS/GRAFTS ANGIOGRAPHY;  Surgeon: Wenona Hamilton, MD;  Location: [facility] INVASIVE CV LAB;  Service: Cardiovascular;  Laterality: N/A;   LEFT HEART CATH AND CORS/GRAFTS ANGIOGRAPHY N/A  06/22/2020   Procedure: LEFT HEART CATH AND CORS/GRAFTS ANGIOGRAPHY;  Surgeon: Lucendia Rusk, MD;  Location: [facility] INVASIVE CV LAB;  Service: Cardiovascular;  Laterality: N/A;   LEFT HEART CATH AND CORS/GRAFTS ANGIOGRAPHY N/A 12/01/2022   Procedure: LEFT HEART CATH AND CORS/GRAFTS ANGIOGRAPHY;  Surgeon: [place]az[place], Peter M, MD;  Location: [facility] INVASIVE CV LAB;  Service: Cardiovascular;  Laterality: N/A;   PERIPHERAL VASCULAR CATHETERIZATION N/A 11/25/2015   Procedure: Lower Extremity Angiography;  Surgeon: Avanell Leigh, MD;  Location: [facility] INVASIVE CV LAB;  Service: Cardiovascular;  Laterality: N/A;   PERIPHERAL VASCULAR CATHETERIZATION Left 11/25/2015   Procedure: Peripheral Vascular Intervention;  Surgeon: Avanell Leigh, MD;  Location: [facility] INVASIVE CV LAB;  Service: Cardiovascular;  Laterality: Left;  external iliac  PERIPHERAL VASCULAR CATHETERIZATION N/A 01/03/2016   Procedure: Lower Extremity Angiography;  Surgeon: Avanell Leigh, MD;  Location: [facility] INVASIVE CV LAB;  Service: Cardiovascular;  Laterality: N/A;   PERIPHERAL VASCULAR CATHETERIZATION Left 01/03/2016   Procedure: Peripheral Vascular Intervention;  Surgeon: Avanell Leigh, MD;  Location: [facility] INVASIVE CV LAB;  Service: Cardiovascular;  Laterality: Left;  SFA   PERIPHERAL VASCULAR CATHETERIZATION Right 02/14/2016   Procedure: Peripheral Vascular Atherectomy;  Surgeon: Avanell Leigh, MD;  Location: [facility] INVASIVE CV LAB;  Service: Cardiovascular;  Laterality: Right;  SFA   POPLITEAL ARTERY STENT  01/03/2016   Contralateral access with a 7 French crossover sheath (second order catheter placement)   TONSILLECTOMY AND ADENOIDECTOMY     TUMOR EXCISION Right ~ 2005   cancerous tumor removed from shoulder   TUMOR EXCISION Right ~ 2000   benign tumor removed from under shoulder   TUMOR EXCISION Left 06/02/2010   resection of Lt SFA wth interposition of Gore-Tex graft   Patient Active Problem List   Diagnosis Date Noted   LV dysfunction  08/07/2023   Complicated UTI (urinary tract infection) 05/03/2023   Hx of CABG 12/20/2022   Urinary tract infection with hematuria 12/18/2022   Sepsis (HCC) 12/18/2022   Iron  deficiency 12/18/2022   Renal insufficiency 12/18/2022   NSTEMI (non-ST elevated myocardial infarction) (HCC) 11/30/2022   Arthritis pain 11/30/2022   BPH (benign prostatic hyperplasia) 11/30/2022   Iron  deficiency anemia due to chronic blood loss 11/15/2022   Acute encephalopathy 02/06/2022   Elevated troponin    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   Hematoma of right hip 01/10/2021   Primary osteoarthritis of right elbow 01/10/2021   Chronic anticoagulation 08/12/2019   Dyslipidemia (high LDL; low HDL) 03/05/2019   Unstable angina (HCC) 11/11/2018   Hypercholesterolemia 02/20/2018   PAF (paroxysmal atrial fibrillation) (HCC) 02/20/2018   CAD (coronary artery disease) of bypass graft 02/06/2018   CAD, multiple vessel 02/06/2018   Leukocytosis 02/16/2016   Transient hypotension 02/15/2016   Peripheral arterial disease (HCC)    Pacemaker 04/23/2015   Positive cardiac stress test    Chest pain    OSA on CPAP 11/04/2012   SSS (sick sinus syndrome), medtronic adapta    Hyperlipidemia due to type 2 diabetes mellitus (HCC)    Superficial femoral artery injury 05/09/2011   SPRAIN&STRAIN OTHER SPECIFIED SITES KNEE&LEG 01/31/2010   Type 2 diabetes mellitus with complication, without long-term current use of insulin  (HCC) 11/26/2006   Essential hypertension 11/26/2006    PCP: Tena Feeling, MD   REFERRING PROVIDER: Tena Feeling, MD   REFERRING DIAG: Z91.81 History of falls  THERAPY DIAG:  Unsteadiness on feet  Balance disorder  Muscle weakness (generalized)  Rationale for Evaluation and Treatment: Rehabilitation  ONSET DATE: October   SUBJECTIVE:   SUBJECTIVE STATEMENT: No falls since last visit. I'm feeling good.   EVAL: Since October had a MI and have been in the hospital a  lot (most recent 05/02/23) I've lost 10-15 pounds. He has had 2 infusions of iron . I've been fatigued a lot. My DM is kicking up. I'm wearing a monitor, but don't quite understand it. It climbs in a heartbeat to 300 and then in the morning it's down to 130. I eat oatmeal and it shoots back up to 300. (Dr. Candi Chafe). He does not use the cane at home. Only when out. I'm losing the feeling in my feet. Gabapentin  doesn't help. Wears a lift in the R shoe due  to leg length discrepancy. Some left knee pain and wears brace.    PERTINENT HISTORY: CLL (leukemia), PAD, DM, CAD, PACEMAKER, on a catheter x 5 months PAIN:  Are you having pain? No - later reports he wears a brace for L knee pain.  PRECAUTIONS: Fall and ICD/Pacemaker  RED FLAGS: None   WEIGHT BEARING RESTRICTIONS: No  FALLS:  Has patient fallen in last 6 months? Yes. Number of falls 6 tripping  LIVING ENVIRONMENT: Lives with: lives with their spouse Lives in: House/apartment Stairs: Yes: Internal: 13 steps; on left going up and External: 1 steps; no rail Has following equipment at home: Single point cane, Walker - 2 wheeled, and Environmental consultant - 4 wheeled  OCCUPATION: retired  PLOF: Independent and daughter does most of the driving, but he does drive  PATIENT GOALS: to feel less clumsy  NEXT MD VISIT: a few months  OBJECTIVE:  Note: Objective measures were completed at Evaluation unless otherwise noted.  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   MUSCLE LENGTH: Tight B gastrocs  POSTURE: rounded shoulders, forward head, and posterior pelvic tilt   LOWER EXTREMITY ROM: WFL for tasks assessed  Active ROM Right eval Left eval  Hip flexion    Hip extension    Hip abduction    Hip adduction    Hip internal rotation    Hip external rotation    Knee  flexion    Knee extension    Ankle dorsiflexion    Ankle plantarflexion    Ankle inversion    Ankle eversion     (Blank rows = not tested)  LOWER EXTREMITY MMT: ABD/ADD in sitting  MMT Right eval Left eval  Hip flexion 4- 4  Hip extension    Hip abduction 5 5  Hip adduction 5 5  Hip internal rotation    Hip external rotation    Knee flexion    Knee extension 5 5  Ankle dorsiflexion 5 5  Ankle plantarflexion    Ankle inversion    Ankle eversion     (Blank rows = not tested)   FUNCTIONAL TESTS:  5 times sit to stand: 15.12 sec (one LOB and sat back down) Timed up and go (TUG): 10.21 sec Berg Balance Scale: 44/56 37-45 significant fall risk (>80%) mCTSIB: 120/120 (mod-min sway on C4)  08/07/23 BERG 50/56 moderate risk for falls   08/23/23 5XSTS 13.06 sec  BERG 53/56  [facility] PT Assessment - 08/23/23 0001       Berg Balance Test   Sit to Stand Able to stand without using hands and stabilize independently    Standing Unsupported Able to stand safely 2 minutes    Sitting with Back Unsupported but Feet Supported on Floor or Stool Able to sit safely and securely 2 minutes    Stand to Sit Sits safely with minimal use of hands    Transfers Able to transfer safely, minor use of hands    Standing Unsupported with Eyes Closed Able to stand 10 seconds safely    Standing Unsupported with Feet Together Able to place feet together independently and stand 1 minute safely    From Standing, Reach Forward with Outstretched Arm Can reach confidently >25 cm (10")    From Standing Position, Pick up Object from Floor Able to pick up shoe safely and  easily    From Standing Position, Turn to Look Behind Over each Shoulder Looks behind from both sides and weight shifts well    Turn 360 Degrees Able to turn 360 degrees safely in 4 seconds or less    Standing Unsupported, Alternately Place Feet on Step/Stool Able to stand independently and safely and complete 8 steps in 20 seconds    Standing  Unsupported, One Foot in Front Able to plae foot ahead of the other independently and hold 30 seconds    Standing on One Leg Able to lift leg independently and hold equal to or more than 3 seconds   Rt leg, L leg = 11 seconds   Total Score 53            GAIT: Distance walked: 20 Assistive device utilized: Single point cane Level of assistance: Modified independence Comments: Wide BOS, B hip ER   [facility] Adult PT Treatment:                                                DATE: 08/23/2023 BERG 53/56 5XSTS 13.06 sec GOALS assessed Therapeutic Exercise: Counter: Runner's lunge stretch B  Heel raises x 20 with 5# wts each hand Heel & Toe walking Resisted side stepping + RTB crossed at ankles Fwd/bkwd diagonal stepping with RTB crossed at ankles using dots on floor for cueing --> bkwd difficult  Neuromuscular re-ed: Stepping on/off airex --> CGA/SBA Lateral stepping on/off airex --> CGA/SBA On Airex: around the worlds with 5# KB normal stance --> semi tandem stance B  Therapeutic Activity: Standing squats 2x10 --> chair behind and arms reaching fwd over counter Standing marching with 5#KB OH x 10 B Walking 2 laps in clinic with 5# KB one at side, one at shoulder height     [facility] Adult PT Treatment:                                                DATE: 08/21/2023 Therapeutic Exercise: Counter: Runner's lunge stretch B  Heel raises x 20 with 5# wts each hand Heel & Toe walking Resisted side stepping + RTB crossed at ankles Fwd/bkwd diagonal stepping with RTB crossed at ankles using dots on floor for cueing --> bkwd difficult  Neuromuscular re-ed: Stepping on/off airex --> CGA/SBA Lateral stepping on/off airex --> CGA/SBA On Airex: around the [place] with 5# KB normal stance --> semi tandem stance B  Therapeutic Activity: Standing squats 3x10 --> chair behind and arms reaching fwd over counter Dual tasking: Marching + naming frutits in ABC order [employer] and catch + naming in  states ABC order  [facility] Adult PT Treatment:                                                DATE: 08/16/2023 Therapeutic Exercise: Counter: Runner's lunge stretch Heel raises Heel & Toe walking Resisted side stepping + RTB crossed at ankles Fwd/bkwd diagonal stepping --> bkwd difficult Neuromuscular re-ed: Stepping on/off airex --> CGA/SBA Lateral stepping on/off airex --> CGA/SBA Therapeutic Activity: Standing squats 3x10 --> chair behind and arms reaching fwd  over counter Dual tasking: Marching + naming cities in [employer] order [place] toss and catch + naming in ABC order   [facility] Adult PT Treatment:                                                DATE: 08/14/2023 Neuromuscular re-ed: Standing on airex (corner, chair in front): Eyes open, slow head turns/nods Eyes closed static balance Stepping on/off airex --> close SBA Therapeutic Activity: Timed STS (see below) Berg balance test  Curb navigation: (close SBA, counter for support as needed) Stepping on/off front --> black stepping Lateral straddle reciprocal stepping --> black stepper Stair navigation --> ascend/descend long staircase + SPC & one railing   [facility] Adult PT Treatment:                                                DATE: 08/09/2023 Therapeutic Exercise: Standing at counter: Alt heel/toe raises x20 Squats Side Lying: [place] + GTB x12  Reverse clamshell + YTB x10 Leg arcs  Neuromuscular re-ed: (Close SBA) Stepping on/off ariex  Standing on airex: Eyes closed balance 2x30" Eyes open + slow head turns/nods  PATIENT EDUCATION:  Education details: Updated HEP  Person educated: Patient Education method: Explanation, Demonstration, and Handouts Education comprehension: verbalized understanding and returned demonstration  HOME EXERCISE PROGRAM: Access Code: [email or web address] URL: [email or web address] Date: 08/23/2023 Prepared by: Concha Deed  Exercises - Sit to Stand  - 3 x daily - 7 x weekly - 1-2 sets - 10  reps - Supine Bridge  - 2 x daily - 7 x weekly - 1-3 sets - 10 reps - Clam with Resistance  - 1 x daily - 7 x weekly - 3 sets - 10 reps - Standing Balance in Corner with Eyes Closed  - 1 x daily - 7 x weekly - 3 sets - 10 reps - 10 sec hold - Semi-Tandem Corner Balance: Eyes Open With Head Turns  - 1 x daily - 7 x weekly - 3 sets - 10 reps - Semi-Tandem Corner Balance With Eyes Closed  - 1 x daily - 7 x weekly - 3 sets - 10 reps - Standing Hip Abduction with Resistance at Ankles and Counter Support  - 1 x daily - 7 x weekly - 3 sets - 10 reps - Standing Hip Extension with Resistance at Ankles and Counter Support  - 1 x daily - 7 x weekly - 3 sets - 10 reps - Toe taps on step  - 1 x daily - 3 x weekly - 2 sets - 10 reps  ASSESSMENT:  CLINICAL IMPRESSION: Robert Burns has met all of his goals and is ready for discharge. He demonstrates significant improvement in balance since evaluation. His BERG score improved 7 points. He also was able to stand on his L LE for 11 seconds today. His R LE is less stable, but he was able to stand for 5 seconds on it. He still demonstrates intermittent LOB primarily with backward walking or backward stepping. He demonstrates good stepping strategies when he experiences LOB. He is using his SPC regularly now which also decreases his risk for falls. He is pleased with his current level of function and agrees to discharge.  OBJECTIVE IMPAIRMENTS: decreased balance, decreased strength, decreased safety awareness, impaired flexibility, impaired sensation, postural dysfunction, and pain.   ACTIVITY LIMITATIONS: stairs and locomotion level  PARTICIPATION LIMITATIONS: meal prep, cleaning, laundry, community activity, and yard work  PERSONAL FACTORS: Age, Past/current experiences, Time since onset of injury/illness/exacerbation, and 3+ comorbidities: leukemia, iron  deficiency, DM, L knee pain are also affecting patient's functional outcome.   REHAB POTENTIAL:  Good  CLINICAL DECISION MAKING: Unstable/unpredictable  EVALUATION COMPLEXITY: High   GOALS: Goals reviewed with patient? Yes  SHORT TERM GOALS: Target date: 07/31/2023   Patient will be independent with initial HEP. Baseline:  Goal status: MET  2.  Patient will demonstrate decreased risk for falls by scoring 48/56 on the BERG. Baseline: 44 Goal status: MET 46-51 moderate RISK FOR FALLS (>50%) 08/07/23  3.  Patient will be educated on strategies to decrease risk of falls.  Baseline:  Goal status: MET   LONG TERM GOALS: Target date: 08/28/2023  Patient will be independent with advanced/ongoing HEP to improve outcomes and carryover.  Baseline:  Goal status: MET  2.  Patient will score 52 on Berg Balance test to demonstrate lower risk of falls. (MCID= 8 points) .  Baseline: 44 08/14/23: 46/56  08/23/23: 53/56 Goal status: MET  3.  Patient will be able to step up/down curb safely with LRAD for safety with community ambulation.  Baseline:  Goal status: MET   4.  Patient will demonstrate decreased 5XSTS by 2-3 seconds and no LOB showing improved functional strength. Baseline: 15.12 sec 08/14/23: 14 sec Goal status: MET 5/14 13.06 sec  5.  Patient able to climb stairs safely with one rail support.  Baseline: descends stairs backwards 08/14/23: one railing + SPC Goal status: MET  6.  Patient will report no falls during therapy episode. Baseline:  Goal status: MET    PLAN:  PT FREQUENCY: 2x/week  PT DURATION: 8 weeks  PLANNED INTERVENTIONS: 97164- PT Re-evaluation, 97110-Therapeutic exercises, 97530- Therapeutic activity, [id number]- Neuromuscular re-education, 97535- Self Care, [id number]- Manual therapy, [phone]- Gait training, Patient/Family education, Balance training, Stair training, and DME instructions  PLAN FOR NEXT SESSION: Assess goals for discharge.  Jinx Mourning, PT  08/23/2023, 1:05 PM  PHYSICAL THERAPY DISCHARGE SUMMARY  Visits from Start of Care: 13  Current  functional level related to goals / functional outcomes: See above   Remaining deficits: See above   Education / Equipment: HEP   Patient agrees to discharge. Patient goals were met. Patient is being discharged due to meeting the stated rehab goals.  Jinx Mourning, PT 08/23/23 1:05 PM  [facility] at [facility] [address] [place], [place] [address]  [phone] (office) [phone] (fax)

## 2023-08-23 ENCOUNTER — Ambulatory Visit: Admitting: Physical Therapy

## 2023-08-23 ENCOUNTER — Encounter: Payer: Self-pay | Admitting: Physical Therapy

## 2023-08-23 DIAGNOSIS — M6281 Muscle weakness (generalized): Secondary | ICD-10-CM

## 2023-08-23 DIAGNOSIS — R2681 Unsteadiness on feet: Secondary | ICD-10-CM

## 2023-08-23 DIAGNOSIS — R2689 Other abnormalities of gait and mobility: Secondary | ICD-10-CM | POA: Diagnosis not present

## 2023-08-24 DIAGNOSIS — R338 Other retention of urine: Secondary | ICD-10-CM | POA: Diagnosis not present

## 2023-09-20 DIAGNOSIS — Z79899 Other long term (current) drug therapy: Secondary | ICD-10-CM | POA: Diagnosis not present

## 2023-09-20 DIAGNOSIS — E1121 Type 2 diabetes mellitus with diabetic nephropathy: Secondary | ICD-10-CM | POA: Diagnosis not present

## 2023-09-20 DIAGNOSIS — Z978 Presence of other specified devices: Secondary | ICD-10-CM | POA: Diagnosis not present

## 2023-09-20 DIAGNOSIS — I5022 Chronic systolic (congestive) heart failure: Secondary | ICD-10-CM | POA: Diagnosis not present

## 2023-09-20 DIAGNOSIS — N401 Enlarged prostate with lower urinary tract symptoms: Secondary | ICD-10-CM | POA: Diagnosis not present

## 2023-09-20 LAB — LAB REPORT - SCANNED
A1c: 7.2
EGFR: 65

## 2023-09-23 ENCOUNTER — Ambulatory Visit

## 2023-09-23 ENCOUNTER — Ambulatory Visit
Admission: EM | Admit: 2023-09-23 | Discharge: 2023-09-23 | Disposition: A | Discharge status: Home / Self Care | Attending: Internal Medicine | Admitting: Internal Medicine

## 2023-09-23 ENCOUNTER — Other Ambulatory Visit: Payer: Self-pay

## 2023-09-23 DIAGNOSIS — M25511 Pain in right shoulder: Secondary | ICD-10-CM

## 2023-09-23 DIAGNOSIS — M25531 Pain in right wrist: Secondary | ICD-10-CM

## 2023-09-23 DIAGNOSIS — W19XXXA Unspecified fall, initial encounter: Secondary | ICD-10-CM | POA: Diagnosis not present

## 2023-09-23 DIAGNOSIS — M25521 Pain in right elbow: Secondary | ICD-10-CM | POA: Diagnosis not present

## 2023-09-23 DIAGNOSIS — M778 Other enthesopathies, not elsewhere classified: Secondary | ICD-10-CM | POA: Diagnosis not present

## 2023-09-23 DIAGNOSIS — Z043 Encounter for examination and observation following other accident: Secondary | ICD-10-CM | POA: Diagnosis not present

## 2023-09-23 DIAGNOSIS — M1811 Unilateral primary osteoarthritis of first carpometacarpal joint, right hand: Secondary | ICD-10-CM | POA: Diagnosis not present

## 2023-09-23 DIAGNOSIS — M19021 Primary osteoarthritis, right elbow: Secondary | ICD-10-CM | POA: Diagnosis not present

## 2023-09-23 DIAGNOSIS — M19011 Primary osteoarthritis, right shoulder: Secondary | ICD-10-CM | POA: Diagnosis not present

## 2023-09-23 NOTE — Discharge Instructions (Addendum)
 X-ray done today on the right wrist, right elbow, right shoulder and right clavicle.  The final evaluation by the radiologist shows no acute fractures or other acute findings on these.  There is some degenerative changes present which may have been flared up by the fall.  Recommend Icing the area 2-3 times daily for 10-15 minutes to help with pain and swelling. Do not apply ice directly to the skin.  Can use Tylenol  for pain.  Slowly increase activity as tolerated but if unable to increase activity back to your previous level within 3 to 5 days then recommend following up with orthopedics.

## 2023-09-23 NOTE — ED Provider Notes (Signed)
 [facility]    CSN: [id number] Arrival date & time: 09/23/23  1323      History   Chief Complaint Chief Complaint  Patient presents with   Fall    HPI RAMESSES CRAMPTON is a 83 y.o. male.   83 year old male who presents urgent care with complaints of right wrist, right elbow, right shoulder and right clavicle pain.  The patient was walking yesterday with his wife with their arms interlocked on the right side.  The patient fell taking his wife down with him.  He actually landed more on his left side but pulled his whole right upper extremity with his wife's arm.  He immediately  began having pain.  Pain is especially bad around the right wrist.  He has some swelling there as well.  He reports that he can only lift his shoulder partly up.  He is having difficulty abducting his arm.  He is able to flex and extend his elbow but it is somewhat sore.  He denies any other injuries during the fall.   Fall Pertinent negatives include no chest pain, no abdominal pain and no shortness of breath.    Past Medical History:  Diagnosis Date   Anemia    Anxiety    Arthritis    all over (11/25/2015)   CAD (coronary artery disease)    a. s/p CABG  06/2002; b. 02/01/15 PCI: DES to prox SVG to PDA, staged PCI of SVG to Diag in 02/2015; c. 04/2017 Cath/PCI: LM nl, LAD 100ost, 23m, 75d, LCX 60ost, OM2 80, RCA 100ost, RPDA 80, LIMA->LAD ok, VG->D1 patent stent, VG->RPDA patent stent, 50p, VG->OM1->OM2 90p (3.0x24 Synergy DES), 100 between OM1->OM2 (med rx).   Cancer [facility])    Right Shoulder, Left Leg- BCC, SCC, AND MELANOMA   Colon polyp    Elevated cholesterol    Epithelioid hemangioendothelioma    s/p resection of left SFA/mass with interposition 6 mm GoreTex graft 06/02/10, s/p repeat resection for positive margins 09/22/10   High blood pressure    Hyperlipidemia    Hypertension    Leg pain    OSA on CPAP    PAD (peripheral artery disease) (HCC)    a. 10/2002 L SFA PTA/BMS; b. 8/17 LE  Angio: LEIA 90 (9x40 self exp stent), LSFA short segment prox occlusion (staged PTA/stenting 01/03/2016), patent mid stent, RSFA 4m (staged PTA/DEB 02/14/2016).   Presence of permanent cardiac pacemaker    Medtronic   Renal insufficiency 12/18/2022   SSS (sick sinus syndrome) (HCC)    a. s/p PPM in 2007 with gen change 04/2015 - Medtronic Adapta ADDRL1, ser # [id number].   Type II diabetes mellitus (HCC)    Type II   Urgency of urination     Patient Active Problem List   Diagnosis Date Noted   LV dysfunction 08/07/2023   Complicated UTI (urinary tract infection) 05/03/2023   Hx of CABG 12/20/2022   Urinary tract infection with hematuria 12/18/2022   Sepsis (HCC) 12/18/2022   Iron  deficiency 12/18/2022   Renal insufficiency 12/18/2022   NSTEMI (non-ST elevated myocardial infarction) (HCC) 11/30/2022   Arthritis pain 11/30/2022   BPH (benign prostatic hyperplasia) 11/30/2022   Iron  deficiency anemia due to chronic blood loss 11/15/2022   Acute encephalopathy 02/06/2022   Elevated troponin    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   Hematoma of right hip 01/10/2021   Primary osteoarthritis of right elbow 01/10/2021   Chronic anticoagulation 08/12/2019   Dyslipidemia (high LDL; low  HDL) 03/05/2019   Unstable angina (HCC) 11/11/2018   Hypercholesterolemia 02/20/2018   PAF (paroxysmal atrial fibrillation) (HCC) 02/20/2018   CAD (coronary artery disease) of bypass graft 02/06/2018   CAD, multiple vessel 02/06/2018   Leukocytosis 02/16/2016   Transient hypotension 02/15/2016   Peripheral arterial disease (HCC)    Pacemaker 04/23/2015   Positive cardiac stress test    Chest pain    OSA on CPAP 11/04/2012   SSS (sick sinus syndrome), medtronic adapta    Hyperlipidemia due to type 2 diabetes mellitus (HCC)    Superficial femoral artery injury 05/09/2011   SPRAIN&STRAIN OTHER SPECIFIED SITES KNEE&LEG 01/31/2010   Type 2 diabetes mellitus with complication, without  long-term current use of insulin  (HCC) 11/26/2006   Essential hypertension 11/26/2006    Past Surgical History:  Procedure Laterality Date   CARDIAC CATHETERIZATION N/A 02/01/2015   Procedure: Left Heart Cath and Coronary Angiography;  Surgeon: Avanell Leigh, MD;  Location: [facility] INVASIVE CV LAB;  Service: Cardiovascular;  Laterality: N/A;   CARDIAC CATHETERIZATION N/A 02/01/2015   Procedure: Coronary Stent Intervention;  Surgeon: Avanell Leigh, MD;  Location: [facility] INVASIVE CV LAB;  Service: Cardiovascular;  Laterality: N/A;   CARDIAC CATHETERIZATION  06/2002   just before bypass OR   CARDIAC CATHETERIZATION N/A 03/01/2015   Procedure: Coronary Stent Intervention;  Surgeon: Avanell Leigh, MD;  Location: [facility] INVASIVE CV LAB;  Service: Cardiovascular;  Laterality: N/A;   CARDIAC CATHETERIZATION  02/06/2018   COLONOSCOPY     CORONARY ANGIOPLASTY     CORONARY ARTERY BYPASS GRAFT  06/2002   x5, LIMA-LAD;VG- Diag; seq VG- ramus & OM branch; VG-PDA   CORONARY STENT INTERVENTION N/A 04/12/2017   Procedure: CORONARY STENT INTERVENTION;  Surgeon: Avanell Leigh, MD;  Location: [facility] INVASIVE CV LAB;  Service: Cardiovascular;  Laterality: N/A;   CORONARY STENT INTERVENTION N/A 02/08/2018   Procedure: CORONARY STENT INTERVENTION;  Surgeon: Lucendia Rusk, MD;  Location: [facility] INVASIVE CV LAB;  Service: Cardiovascular;  Laterality: N/A;   CORONARY STENT INTERVENTION N/A 11/12/2018   Procedure: CORONARY STENT INTERVENTION;  Surgeon: Wenona Hamilton, MD;  Location: [facility] INVASIVE CV LAB;  Service: Cardiovascular;  Laterality: N/A;   CORONARY STENT INTERVENTION N/A 06/22/2020   Procedure: CORONARY STENT INTERVENTION;  Surgeon: Lucendia Rusk, MD;  Location: [facility] INVASIVE CV LAB;  Service: Cardiovascular;  Laterality: N/A;   EP IMPLANTABLE DEVICE N/A 04/23/2015   Procedure: PPM Generator Changeout;  Surgeon: Luana Rumple, MD;  Location: [facility] INVASIVE CV LAB;  Service: Cardiovascular;  Laterality: N/A;    FALSE ANEURYSM REPAIR Left 11/29/2018   Procedure: REPAIR FALSE ANEURYSM LEFT RADIAL ARTERY;  Surgeon: Mayo Speck, MD;  Location: [facility] OR;  Service: Vascular;  Laterality: Left;   FEMORAL ARTERY STENT Left ~ 2014   taken out of my leg; couldn' catorgorize what kind so the put it under all 3; cataroziepitheloid hemanioendotheliomau   FOOT FRACTURE SURGERY Left 1973   FRACTURE SURGERY     INSERT / REPLACE / REMOVE PACEMAKER  10/13/05   right side, medtronic Adapta   IR 3D INDEPENDENT WKST  02/26/2023   IR ANGIOGRAM PELVIS SELECTIVE OR SUPRASELECTIVE  02/26/2023   IR ANGIOGRAM SELECTIVE EACH ADDITIONAL VESSEL  02/26/2023   IR ANGIOGRAM SELECTIVE EACH ADDITIONAL VESSEL  02/26/2023   IR EMBO TUMOR ORGAN ISCHEMIA INFARCT INC GUIDE ROADMAPPING  02/26/2023   IR RADIOLOGIST EVAL & MGMT  02/02/2023   IR US  GUIDE VASC ACCESS LEFT  02/26/2023   IR  US  GUIDE VASC ACCESS LEFT  02/26/2023   IR US  GUIDE VASC ACCESS LEFT  02/26/2023   KNEE HARDWARE REMOVAL Right 1950's   3-4 months after the insertion   KNEE SURGERY Right 1950's   broke my lower leg; had to put pin in my knee to keep lower leg in place til it healed   LEFT HEART CATH AND CORS/GRAFTS ANGIOGRAPHY N/A 04/12/2017   Procedure: LEFT HEART CATH AND CORS/GRAFTS ANGIOGRAPHY;  Surgeon: Avanell Leigh, MD;  Location: [facility] INVASIVE CV LAB;  Service: Cardiovascular;  Laterality: N/A;   LEFT HEART CATH AND CORS/GRAFTS ANGIOGRAPHY N/A 02/06/2018   Procedure: LEFT HEART CATH AND CORS/GRAFTS ANGIOGRAPHY;  Surgeon: Millicent Ally, MD;  Location: [facility] INVASIVE CV LAB;  Service: Cardiovascular;  Laterality: N/A;   LEFT HEART CATH AND CORS/GRAFTS ANGIOGRAPHY N/A 11/12/2018   Procedure: LEFT HEART CATH AND CORS/GRAFTS ANGIOGRAPHY;  Surgeon: Wenona Hamilton, MD;  Location: [facility] INVASIVE CV LAB;  Service: Cardiovascular;  Laterality: N/A;   LEFT HEART CATH AND CORS/GRAFTS ANGIOGRAPHY N/A 06/22/2020   Procedure: LEFT HEART CATH AND CORS/GRAFTS ANGIOGRAPHY;   Surgeon: Lucendia Rusk, MD;  Location: [facility] INVASIVE CV LAB;  Service: Cardiovascular;  Laterality: N/A;   LEFT HEART CATH AND CORS/GRAFTS ANGIOGRAPHY N/A 12/01/2022   Procedure: LEFT HEART CATH AND CORS/GRAFTS ANGIOGRAPHY;  Surgeon: [place], Peter M, MD;  Location: [facility] INVASIVE CV LAB;  Service: Cardiovascular;  Laterality: N/A;   PERIPHERAL VASCULAR CATHETERIZATION N/A 11/25/2015   Procedure: Lower Extremity Angiography;  Surgeon: Avanell Leigh, MD;  Location: [facility] INVASIVE CV LAB;  Service: Cardiovascular;  Laterality: N/A;   PERIPHERAL VASCULAR CATHETERIZATION Left 11/25/2015   Procedure: Peripheral Vascular Intervention;  Surgeon: Avanell Leigh, MD;  Location: [facility] INVASIVE CV LAB;  Service: Cardiovascular;  Laterality: Left;  external iliac   PERIPHERAL VASCULAR CATHETERIZATION N/A 01/03/2016   Procedure: Lower Extremity Angiography;  Surgeon: Avanell Leigh, MD;  Location: [facility] INVASIVE CV LAB;  Service: Cardiovascular;  Laterality: N/A;   PERIPHERAL VASCULAR CATHETERIZATION Left 01/03/2016   Procedure: Peripheral Vascular Intervention;  Surgeon: Avanell Leigh, MD;  Location: [facility] INVASIVE CV LAB;  Service: Cardiovascular;  Laterality: Left;  SFA   PERIPHERAL VASCULAR CATHETERIZATION Right 02/14/2016   Procedure: Peripheral Vascular Atherectomy;  Surgeon: Avanell Leigh, MD;  Location: [facility] INVASIVE CV LAB;  Service: Cardiovascular;  Laterality: Right;  SFA   POPLITEAL ARTERY STENT  01/03/2016   Contralateral access with a 7 [name] crossover sheath (second order catheter placement)   TONSILLECTOMY AND ADENOIDECTOMY     TUMOR EXCISION Right ~ 2005   cancerous tumor removed from shoulder   TUMOR EXCISION Right ~ 2000   benign tumor removed from under shoulder   TUMOR EXCISION Left 06/02/2010   resection of Lt SFA wth interposition of Gore-Tex graft       Home Medications    Prior to Admission medications   Medication Sig Start Date End Date Taking? Authorizing Provider   atorvastatin  (LIPITOR ) 80 MG tablet Take 1 tablet (80 mg total) by mouth daily at 6 PM. 02/09/18   Ardia Kraft, PA-C  finasteride  (PROSCAR ) 5 MG tablet Take 5 mg by mouth daily.    [provider]  furosemide  (LASIX ) 20 MG tablet Take 1 tablet (20 mg total) by mouth 2 (two) times daily. 08/15/23   Avanell Leigh, MD  glipiZIDE  (GLUCOTROL ) 5 MG tablet Take 5 mg by mouth daily with breakfast.  01/10/18   [provider]  isosorbide  mononitrate (  IMDUR ) 60 MG 24 hr tablet Take 60 mg by mouth daily.    [provider]  JARDIANCE  10 MG TABS tablet Take 1 tablet (10 mg total) by mouth daily. 05/01/23   Tania Familia, NP  metFORMIN  (GLUCOPHAGE ) 1000 MG tablet Take 1 tablet (1,000 mg total) by mouth 2 (two) times daily with a meal. Restart on 3/18. 06/23/20   [facility], Warren Haber, PA  metoprolol  tartrate (LOPRESSOR ) 50 MG tablet Take 50 mg by mouth 2 (two) times daily.    [provider]  Multiple Vitamin-Folic Acid  TABS Take 1 tablet by mouth daily. 01/29/23   Avanell Leigh, MD  nitroGLYCERIN  (NITROSTAT ) 0.4 MG SL tablet Place 1 tablet (0.4 mg total) under the tongue every 5 (five) minutes as needed for chest pain. 08/07/23   Avanell Leigh, MD  ranolazine  (RANEXA ) 500 MG 12 hr tablet Take 500 mg by mouth 2 (two) times daily.    [provider]  rivaroxaban  (XARELTO ) 20 MG TABS tablet Take 1 tablet (20 mg total) by mouth daily with supper. Patient taking differently: Take 20 mg by mouth at bedtime. 05/01/23   Tania Familia, NP  tamsulosin  (FLOMAX ) 0.4 MG CAPS capsule Take 0.4 mg by mouth daily after supper.    [provider]  VITAMIN D  PO Take 1,000 Units by mouth in the morning and at bedtime.    [provider]    Family History Family History  Problem Relation Age of Onset   Coronary artery disease Mother    Heart attack Mother    Hypertension Mother    Heart disease Mother        Open  Heart surgery   Diabetes  Father    Heart disease Father    Hyperlipidemia Father    Heart attack Father    Hypertension Sister    Diabetes Sister    Heart disease Sister    Liver disease Neg Hx    Esophageal cancer Neg Hx    Colon cancer Neg Hx     Social History Social History   Tobacco Use   Smoking status: Former    Types: Pipe    Quit date: 1973    Years since quitting: 52.4   Smokeless tobacco: Never   Tobacco comments:    quit smoking in 1973  Vaping Use   Vaping status: Never Used  Substance Use Topics   Alcohol use: No   Drug use: No     Allergies   Peanut-containing drug products, Ace inhibitors, Diltiazem hcl, Meloxicam, Doxycycline, Eliquis  [apixaban ], Sertraline  hcl, Carvedilol, Clonidine  hcl, and Duloxetine hcl   Review of Systems Review of Systems  Constitutional:  Negative for chills and fever.  HENT:  Negative for ear pain and sore throat.   Eyes:  Negative for pain and visual disturbance.  Respiratory:  Negative for cough and shortness of breath.   Cardiovascular:  Negative for chest pain and palpitations.  Gastrointestinal:  Negative for abdominal pain and vomiting.  Genitourinary:  Negative for dysuria and hematuria.  Musculoskeletal:  Negative for arthralgias and back pain.       Right wrist, right elbow, right shoulder, right clavicle pain  Skin:  Negative for color change and rash.  Neurological:  Negative for seizures and syncope.  All other systems reviewed and are negative.    Physical Exam Triage Vital Signs ED Triage Vitals  Encounter Vitals Group     BP 09/23/23 1352 133/61     Girls Systolic  BP Percentile --      Girls Diastolic BP Percentile --      Boys Systolic BP Percentile --      Boys Diastolic BP Percentile --      Pulse Rate 09/23/23 1352 (!) 56     Resp 09/23/23 1352 19     Temp 09/23/23 1352 97.7 F (36.5 C)     Temp src --      SpO2 09/23/23 1352 98 %     Weight --      Height --      Head Circumference --      Peak Flow --       Pain Score 09/23/23 1350 4     Pain Loc --      Pain Education --      Exclude from Growth Chart --    No data found.  Updated Vital Signs BP 133/61   Pulse (!) 56   Temp 97.7 F (36.5 C)   Resp 19   SpO2 98%   Visual Acuity Right Eye Distance:   Left Eye Distance:   Bilateral Distance:    Right Eye Near:   Left Eye Near:    Bilateral Near:     Physical Exam Vitals and nursing note reviewed.  Constitutional:      General: He is not in acute distress.    Appearance: He is well-developed.  HENT:     Head: Normocephalic and atraumatic.   Eyes:     Conjunctiva/sclera: Conjunctivae normal.    Cardiovascular:     Rate and Rhythm: Normal rate and regular rhythm.     Heart sounds: No murmur heard. Pulmonary:     Effort: Pulmonary effort is normal. No respiratory distress.     Breath sounds: Normal breath sounds.  Abdominal:     Palpations: Abdomen is soft.     Tenderness: There is no abdominal tenderness.   Musculoskeletal:        General: No swelling.     Right shoulder: Tenderness (Especially along the clavicle and superior shoulder) and bony tenderness present. No swelling. Decreased range of motion. Decreased strength. Normal pulse.     Right elbow: No swelling or lacerations. Normal range of motion. Tenderness present in lateral epicondyle (Very mild).     Right wrist: Swelling and tenderness (Especially along the dorsal aspect of the wrist) present. No deformity or crepitus. Decreased range of motion. Normal pulse.     Right hand: Normal. Normal range of motion. Normal strength. Normal pulse.     Cervical back: Neck supple.   Skin:    General: Skin is warm and dry.     Capillary Refill: Capillary refill takes less than 2 seconds.   Neurological:     Mental Status: He is alert.   Psychiatric:        Mood and Affect: Mood normal.     UC Treatments / Results  Labs (all labs ordered are listed, but only abnormal results are displayed) Labs Reviewed - No  data to display  EKG   Radiology No results found.  Procedures Procedures (including critical care time)  Medications Ordered in UC Medications - No data to display  Initial Impression / Assessment and Plan / UC Course  I have reviewed the triage vital signs and the nursing notes.  Pertinent labs & imaging results that were available during my care of the patient were reviewed by me and considered in my medical decision making (see chart for details).  Right wrist pain - Plan: DG Wrist Complete Right, DG Wrist Complete Right  Right elbow pain - Plan: DG Elbow Complete Right, DG Elbow Complete Right  Acute pain of right shoulder - Plan: DG Clavicle Right, DG Shoulder Right, DG Clavicle Right, DG Shoulder Right   X-ray done today on the right wrist, right elbow, right shoulder and right clavicle.  The final evaluation by the radiologist shows no acute fractures or other acute findings on these.  There is some degenerative changes present which may have been flared up by the fall.  Recommend Icing the area 2-3 times daily for 10-15 minutes to help with pain and swelling. Do not apply ice directly to the skin.  Can use Tylenol  for pain.  Slowly increase activity as tolerated but if unable to increase activity back to your previous level within 3 to 5 days then recommend following up with orthopedics.  Final Clinical Impressions(s) / UC Diagnoses   Final diagnoses:  Right wrist pain  Right elbow pain  Acute pain of right shoulder   Discharge Instructions   None    ED Prescriptions   None    PDMP not reviewed this encounter.   Kreg Pesa, [place] 09/23/23 1504

## 2023-09-23 NOTE — ED Triage Notes (Signed)
 [facility] with [facility] right arm/ wrist and shoulder pain since a fall yesterday while at church yesterday at 4 pm.pt has taken otc tylenol  and is on blood thinners.

## 2023-09-24 DIAGNOSIS — R338 Other retention of urine: Secondary | ICD-10-CM | POA: Diagnosis not present

## 2023-09-27 NOTE — Progress Notes (Signed)
 Remote pacemaker transmission.

## 2023-09-27 NOTE — Addendum Note (Signed)
 Addended by: Lott Rouleau A on: 09/27/2023 08:51 AM   Modules accepted: Orders

## 2023-10-19 ENCOUNTER — Other Ambulatory Visit: Payer: Self-pay | Admitting: Hematology and Oncology

## 2023-10-19 ENCOUNTER — Other Ambulatory Visit: Payer: Medicare HMO

## 2023-10-19 ENCOUNTER — Ambulatory Visit: Payer: Medicare HMO | Admitting: Hematology and Oncology

## 2023-10-24 DIAGNOSIS — R338 Other retention of urine: Secondary | ICD-10-CM | POA: Diagnosis not present

## 2023-10-29 ENCOUNTER — Other Ambulatory Visit: Payer: Self-pay | Admitting: Adult Health

## 2023-11-06 ENCOUNTER — Ambulatory Visit: Attending: Cardiovascular Disease | Admitting: Cardiovascular Disease

## 2023-11-06 ENCOUNTER — Encounter: Payer: Self-pay | Admitting: Cardiovascular Disease

## 2023-11-06 VITALS — BP 124/62 | HR 80 | Ht 67.0 in | Wt 157.6 lb

## 2023-11-06 DIAGNOSIS — Z5181 Encounter for therapeutic drug level monitoring: Secondary | ICD-10-CM | POA: Diagnosis not present

## 2023-11-06 DIAGNOSIS — I495 Sick sinus syndrome: Secondary | ICD-10-CM

## 2023-11-06 DIAGNOSIS — I519 Heart disease, unspecified: Secondary | ICD-10-CM

## 2023-11-06 DIAGNOSIS — I25810 Atherosclerosis of coronary artery bypass graft(s) without angina pectoris: Secondary | ICD-10-CM | POA: Diagnosis not present

## 2023-11-06 DIAGNOSIS — E785 Hyperlipidemia, unspecified: Secondary | ICD-10-CM

## 2023-11-06 DIAGNOSIS — E1169 Type 2 diabetes mellitus with other specified complication: Secondary | ICD-10-CM | POA: Diagnosis not present

## 2023-11-06 DIAGNOSIS — I10 Essential (primary) hypertension: Secondary | ICD-10-CM | POA: Diagnosis not present

## 2023-11-06 DIAGNOSIS — I739 Peripheral vascular disease, unspecified: Secondary | ICD-10-CM | POA: Diagnosis not present

## 2023-11-06 MED ORDER — NITROGLYCERIN 0.4 MG SL SUBL: 0.4000 mg | SUBLINGUAL_TABLET | SUBLINGUAL | 3 refills | Status: AC | PRN

## 2023-11-06 NOTE — Assessment & Plan Note (Signed)
 History of hyperlipidemia on high-dose statin therapy with lipid profile performed 12/01/2022 revealing total cholesterol 61, LDL 23 and HDL of 29.

## 2023-11-06 NOTE — Progress Notes (Signed)
 11/06/2023 Robert Burns   18-Jul-1940  [id number]  Primary Physician Robert Trula SQUIBB, MD Primary Cardiologist: Robert PARAS Lesches MD Robert Burns, [place]  HPI:  Robert Burns is a 83 y.o.  married Caucasian male, father of 2, grandfather to 3 grandchildren whose wife Robert Burns who is also a patient of mine. I last saw him in the office 08/07/23.SABRA  He is accompanied by his daughter Robert Burns today .  He has a history of CAD status post coronary artery bypass grafting March 2004 with a LIMA to his LAD, a vein to a diagonal branch, a sequential vein to a ramus and OM branch, as well as a vein to the PDA. Last functional study performed July 28, 2011, was entirely normal. 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 He had a pacemaker placed for sick sinus syndrome November 2008 followed by Dr. Francyne. He has obstructive sleep apnea on CPAP. He complain of left thigh pain and I angiogram'd him revealing patent arteries though he did have a space-occupying lesion which was removed by Dr. Gerlean with placement of an interposition 6-mm Gore-Tex graft. The pathology was uncertain. 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 Dr. Luiz follows his lipid profile.Since I saw him back 11/07/12 he has done well.  Followup Dopplers performed in our office 09/27/12 revealed a high-grade lesion in the distal right SFA with an ABI of 0.3. His left ABI 1.1 without obstructive disease.  His most recent lower extremity Doppler Dopplers performed 9/32/16 revealed a right ABI 0.82  And a left ABI of 0.89. Over the last 3 months he's noticed anginal chest pain occurring both at rest and with exertion with left upper extremity radiation. He also complains of left lower extremity discomfort. to moderate anterolateral ischemia. Because of ongoing chest pain and a Myoview  that showed anterolateral ischemia and he underwent cardiac catheterization on 02/01/15 revealing high grade segmental proximal  right SVG disease and subtotally occluded diagonal branch SVG. He underwent stenting of his RCA SVG successfully. He does have continued chest pain although somewhat improved since his last procedure.  I brought him back for staged diagonal branch SVG intervention on 03/01/15. This was successful and since that time he denies chest pain or shortness of breath. He underwent a generator change by Dr. Francyne in January for end-of-life of his Medtronic pacemaker. Since I saw him 6 months ago he developed new left calf claudication of the last 3 months. Lower extremity arterial Doppler studies performed today revealed a decline in his left ABI from 0.87 down to 0.59 with an occluded left SFA that is a new finding. 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 He had a short segment occlusion of the proximal left SFA with patent mid left SFA stent. He had 90% segmental calcified mid right SFA stenosis with three-vessel runoff bilaterally. I ended up stenting his left external iliac artery with a 9 mm x 40 mm long nitinol self-expanding  stent which improved his symptoms somewhat although he continued to have lifestyle limiting claudication. He underwent staged intervention of his left SFA on 01/03/16. I stented the entire quadrant totally occluded segment with a 6 mm x 250 mm long Viabahn  Stent. His claudication on the left  has resolved and his ABIs normalized. He now complains of right leg medication. I did  demonstrate a 90% calcified segmental mid right SFA stenosis at the time of angiography 11/25/15. He underwent diamondback orbital rotation atherectomy/drug-eluting angioplasty of his mid calcified right SFA by myself 02/14/16. This follow-up Dopplers performed 02/24/16 revealed normal ABIs bilaterally. His claudication has improved. When I saw him in the office  possibly 3 weeks ago he was complaining of fairly new onset substernal chest pain over the prior several months occurring several times a week. These were new since his RCA and diagonal branch vein graft interventions at the end of 2016. He is on good antianginal medications. A Myoview  stress test was ordered that showed subtle inferolateral ischemia and echo showed normal LV function.SABRA   He was admitted to the hospital 02/06/2018 for 3 days with unstable angina.  He underwent catheterization by Dr. Burnard on 02/06/2018 revealing disease in all 3 vein grafts as well as in the LAD beyond LIMA insertion, 2 days later he underwent complex intervention on his diagonal and ramus branch vein grafts with restenting as well as intervention on his LAD via LIMA insertion.  His RCA vein graft was not intervened on.  He currently denies angina or claudication.    He was admitted to the hospital with unstable angina on 11/11/2018.  Enzymes were negative.  He underwent catheterization and intervention by Dr. Darron 11/12/2018 via the left radial approach with stenting of his proximal diagonal branch vein graft in his distal PDA vein graft.  Since discharge, his anginal symptoms have significantly improved although he still has a heaviness feeling in his chest.  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  This was subsequently surgically addressed by Dr. Oris.   Dr. Francyne did identify significant A. fib burden on remote interrogation of his pacemaker and he was subsequent begun on Eliquis  which was transitioned to Xarelto .  He gets occasional chest pain not requiring sublingual nitroglycerin .  His most recent lower extremity arterial Doppler studies revealed patent left iliac and SFAs bilaterally.  Carotid Dopplers performed last December showed moderate right ICA stenosis.   He was admitted to the hospital with unstable angina 06/21/2020 and underwent diagnostic coronary angiography by Dr.  Dann via the femoral approach with intervention of his ramus branch vein graft and PDA vein graft with excellent result.  Since discharge on 06/24/2020 he feels clinically improved but was feeling an episode of chest pain while raking leaves although no discomfort while doing water aerobics for 45 minutes.  He was seen by Dr. CLEMENTEEN Betters 11/09/2020 relating some of the symptoms of chest pain.  A Myoview  stress test was ordered on 11/12/2020 which was entirely normal.  At this point, he is on maximum antianginal medications without room to add or increase and given the paucity of symptoms I did not feel compelled to pursue a more invasive approach at this time.  He had a non-STEMI in August of last year.  non-STEMI.n.  He underwent a heart catheterization by Dr. Swaziland 12/01/2022 revealing an occluded native RCA and RCA vein graft which apparently was his culprit vessel.  Patent LIMA to the LAD, known occluded sequential vein to ramus branch and OM and a patent vein to the diagonal branch with 80% in-stent restenosis within the proximal vein graft stent.  It was elected to treat this medically.  His last nitroglycerin  that he took was 6 weeks ago.  He does get chest pain however on a weekly basis.   Since I saw him in the office  3 months ago he is remained stable.  He is only had to take nitroglycerin  once or twice.  He really does not get out of the house much.     Current Meds  Medication Sig   amLODipine  (NORVASC ) 10 MG tablet Take 10 mg by mouth daily.   atorvastatin  (LIPITOR ) 80 MG tablet Take 1 tablet (80 mg total) by mouth daily at 6 PM.   Continuous Glucose Receiver (DEXCOM G7 RECEIVER) DEVI as directed.   Continuous Glucose Sensor (DEXCOM G7 SENSOR) MISC as directed.   dorzolamide -timolol  (COSOPT ) 2-0.5 % ophthalmic solution Place 1 drop into both eyes 2 (two) times daily.   finasteride  (PROSCAR ) 5 MG tablet Take 5 mg by mouth daily.   furosemide  (LASIX ) 20 MG tablet Take 1 tablet (20 mg total)  by mouth 2 (two) times daily.   glipiZIDE  (GLUCOTROL ) 5 MG tablet Take 5 mg by mouth daily with breakfast.    isosorbide  mononitrate (IMDUR ) 60 MG 24 hr tablet Take 60 mg by mouth daily.   JARDIANCE  10 MG TABS tablet TAKE 1 TABLET EVERY DAY   metFORMIN  (GLUCOPHAGE ) 1000 MG tablet Take 1 tablet (1,000 mg total) by mouth 2 (two) times daily with a meal. Restart on 3/18.   metoprolol  tartrate (LOPRESSOR ) 50 MG tablet Take 50 mg by mouth 2 (two) times daily.   Multiple Vitamin-Folic Acid  TABS Take 1 tablet by mouth daily.   ranolazine  (RANEXA ) 500 MG 12 hr tablet Take 500 mg by mouth 2 (two) times daily.   rivaroxaban  (XARELTO ) 20 MG TABS tablet Take 1 tablet (20 mg total) by mouth daily with supper. (Patient taking differently: Take 20 mg by mouth at bedtime.)   tamsulosin  (FLOMAX ) 0.4 MG CAPS capsule Take 0.4 mg by mouth daily after supper.   VITAMIN D  PO Take 1,000 Units by mouth in the morning and at bedtime.   [DISCONTINUED] nitroGLYCERIN  (NITROSTAT ) 0.4 MG SL tablet Place 1 tablet (0.4 mg total) under the tongue every 5 (five) minutes as needed for chest pain.     Allergies  Allergen Reactions   Peanut-Containing Drug Products Anaphylaxis and Other (See Comments)    Tongue swelling is severe    Ace Inhibitors Cough        Diltiazem Hcl Other (See Comments)    UNSPECIFIED REACTION     Meloxicam Other (See Comments)    GI upset    Doxycycline Other (See Comments) and Cough    Reaction of cough and runny nose   Eliquis  [Apixaban ] Other (See Comments)    dizziness   Sertraline  Hcl Other (See Comments)   Carvedilol Itching   Clonidine  Hcl Other (See Comments)    Patch only - skin irritation   Duloxetine Hcl Other (See Comments)    Urinary frequency    Social History   Socioeconomic History   Marital status: Married    Spouse name: Not on file   Number of children: 2   Years of education: Not on file   Highest education level: Not on file  Occupational History    Occupation: retired  Tobacco Use   Smoking status: Former    Types: Pipe    Quit date: 1973    Years since quitting: 52.6   Smokeless tobacco: Never   Tobacco comments:    quit smoking in 1973  Vaping Use   Vaping status: Never Used  Substance and Sexual Activity   Alcohol use: No   Drug use: No   Sexual activity: Not Currently  Other Topics Concern   Not on file  Social History Narrative   Not on file   Social Drivers of Health   Financial Resource Strain: Not on file  Food Insecurity: No Food Insecurity (05/03/2023)   Hunger Vital Sign    Worried About Running Out of Food in the Last Year: Never true    Ran Out of Food in the Last Year: Never true  Transportation Needs: No Transportation Needs (05/03/2023)   PRAPARE - Administrator, Civil Service (Medical): No    Lack of Transportation (Non-Medical): No  Physical Activity: Not on file  Stress: No Stress Concern Present (12/07/2022)   Received from [employer] of Occupational Health - Occupational Stress Questionnaire    Feeling of Stress : Not at all  Social Connections: Socially Integrated (05/03/2023)   Social Connection and Isolation Panel    Frequency of Communication with Friends and Family: Twice a week    Frequency of Social Gatherings with Friends and Family: Twice a week    Attends Religious Services: 1 to 4 times per year    Active Member of [employer] or Organizations: Yes    Attends Banker Meetings: 1 to 4 times per year    Marital Status: Married  Catering manager Violence: Not At Risk (05/03/2023)   Humiliation, Afraid, Rape, and Kick questionnaire    Fear of Current or Ex-Partner: No    Emotionally Abused: No    Physically Abused: No    Sexually Abused: No     Review of Systems: General: negative for chills, fever, night sweats or weight changes.  Cardiovascular: negative for chest pain, dyspnea on exertion, edema, orthopnea, palpitations, paroxysmal  nocturnal dyspnea or shortness of breath Dermatological: negative for rash Respiratory: negative for cough or wheezing Urologic: negative for hematuria Abdominal: negative for nausea, vomiting, diarrhea, bright red blood per rectum, melena, or hematemesis Neurologic: negative for visual changes, syncope, or dizziness All other systems reviewed and are otherwise negative except as noted above.    Blood pressure 124/62, pulse 80, height 5' 7 (1.702 m), weight 157 lb 9.6 oz (71.5 kg), SpO2 97%.  General appearance: alert and no distress Neck: no adenopathy, no carotid bruit, no JVD, supple, symmetrical, trachea midline, and thyroid  not enlarged, symmetric, no tenderness/mass/nodules Lungs: clear to auscultation bilaterally Heart: regular rate and rhythm, S1, S2 normal, no murmur, click, rub or gallop Extremities: extremities normal, atraumatic, no cyanosis or edema Pulses: 2+ and symmetric Skin: Skin color, texture, turgor normal. No rashes or lesions Neurologic: Grossly normal  EKG EKG Interpretation Date/Time:  Tuesday November 06 2023 10:15:51 EDT Ventricular Rate:  87 PR Interval:  176 QRS Duration:  198 QT Interval:  444 QTC Calculation: 534 R Axis:   232  Text Interpretation: AV dual-paced rhythm with frequent ventricular-paced complexes When compared with ECG of 03-May-2023 01:53, PREVIOUS ECG IS PRESENT Confirmed by Court Carrier [phone]) on 11/06/2023 10:25:15 AM    ASSESSMENT AND PLAN:   Essential hypertension History of essential hypertension with blood pressure measured today at 124/62.  He is on metoprolol .  SSS (sick sinus syndrome), medtronic adapta History of sick sinus syndrome status post permanent transvenous pacemaker implantation 10/13/2005 followed by Dr. Francyne.  Hyperlipidemia due to type 2 diabetes mellitus (HCC) History of hyperlipidemia on high-dose statin therapy with lipid profile performed 12/01/2022 revealing total cholesterol 61, LDL 23 and HDL of  29.  CAD (coronary artery disease) of bypass graft History of CAD status  post CABG March 2004 with a LIMA to his LAD, vein to diagonal branch, sequential vein to a ramus and OM branch and vein to the PDA.  He has had multiple procedures over the last 20 years including RCA graft intervention as well as complex intervention by Dr. Burnard 02/07/2028 of his diagonal and ramus branch vein grafts as well as LAD through the LIMA.  His last cath was performed by Dr. Swaziland in the setting of non-STEMI 12/01/2022 revealing an occluded RCA vein graft.  The LIMA to the LAD was patent with a known occluded sequential vein to the ramus branch and OM branch.  He had a vein to the diagonal branch which was patent as well.  Medical therapy was recommended.  He is on antianginal therapy and has minimal angina.  Peripheral arterial disease (HCC) History of peripheral arterial disease status post stenting of his left SFA by myself initially 10/30/2002.  He has had multiple interventions since that time most recently by myself 02/14/2016.  He currently denies claudication.  LV dysfunction Mild LV dysfunction with an EF of 40 to 45% by 2D echo 08/09/2023.  He also has mild to moderate aortic insufficiency.     Robert DOROTHA Lesches MD FACP,FACC,FAHA, [facility] 11/06/2023 10:38 AM

## 2023-11-06 NOTE — Assessment & Plan Note (Signed)
 History of peripheral arterial disease status post stenting of his left SFA by myself initially 10/30/2002.  He has had multiple interventions since that time most recently by myself 02/14/2016.  He currently denies claudication.

## 2023-11-06 NOTE — Assessment & Plan Note (Signed)
 History of sick sinus syndrome status post permanent transvenous pacemaker implantation 10/13/2005 followed by Dr. Francyne.

## 2023-11-06 NOTE — Assessment & Plan Note (Signed)
 History of essential hypertension with blood pressure measured today at 124/62.  He is on metoprolol .

## 2023-11-06 NOTE — Assessment & Plan Note (Signed)
 History of CAD status post CABG March 2004 with a LIMA to his LAD, vein to diagonal branch, sequential vein to a ramus and OM branch and vein to the PDA.  He has had multiple procedures over the last 20 years including RCA graft intervention as well as complex intervention by Dr. Burnard 02/07/2028 of his diagonal and ramus branch vein grafts as well as LAD through the LIMA.  His last cath was performed by Dr. Swaziland in the setting of non-STEMI 12/01/2022 revealing an occluded RCA vein graft.  The LIMA to the LAD was patent with a known occluded sequential vein to the ramus branch and OM branch.  He had a vein to the diagonal branch which was patent as well.  Medical therapy was recommended.  He is on antianginal therapy and has minimal angina.

## 2023-11-06 NOTE — Assessment & Plan Note (Signed)
 Mild LV dysfunction with an EF of 40 to 45% by 2D echo 08/09/2023.  He also has mild to moderate aortic insufficiency.

## 2023-11-06 NOTE — Patient Instructions (Signed)
 Medication Instructions:  Your physician recommends that you continue on your current medications as directed. Please refer to the Current Medication list given to you today.  *If you need a refill on your cardiac medications before your next appointment, please call your pharmacy*   Follow-Up: At [facility], you and your health needs are our priority.  As part of our continuing mission to provide you with exceptional heart care, our providers are all part of one team.  This team includes your primary Cardiologist (physician) and Advanced Practice Providers or APPs (Physician Assistants and Nurse Practitioners) who all work together to provide you with the care you need, when you need it.  Your next appointment:   6 month(s)  Provider:   Marcie Sever, PA-C, Callie Goodrich, PA-C, Kathleen Johnson, PA-C, Hao Meng, PA-C, Marlana Silvan, NP, or Katlyn West, NP         Then, Lauro Portal, MD will plan to see you again in 12 month(s).    We recommend signing up for the patient portal called "MyChart".  Sign up information is provided on this After Visit Summary.  MyChart is used to connect with patients for Virtual Visits (Telemedicine).  Patients are able to view lab/test results, encounter notes, upcoming appointments, etc.  Non-urgent messages can be sent to your provider as well.   To learn more about what you can do with MyChart, go to [email or web address].

## 2023-11-13 ENCOUNTER — Ambulatory Visit (INDEPENDENT_AMBULATORY_CARE_PROVIDER_SITE_OTHER): Payer: Medicare HMO

## 2023-11-13 DIAGNOSIS — I495 Sick sinus syndrome: Secondary | ICD-10-CM | POA: Diagnosis not present

## 2023-11-15 ENCOUNTER — Ambulatory Visit: Payer: Self-pay | Admitting: Cardiovascular Disease

## 2023-11-15 LAB — CUP PACEART REMOTE DEVICE CHECK
Battery Impedance: 1268 Ohm
Battery Remaining Longevity: 54 mo
Battery Voltage: 2.77 V
Brady Statistic AP VP Percent: 3 %
Brady Statistic AP VS Percent: 94 %
Brady Statistic AS VP Percent: 0 %
Brady Statistic AS VS Percent: 3 %
Date Time Interrogation Session: 20250806160040
Implantable Lead Connection Status: 753985
Implantable Lead Connection Status: 753985
Implantable Lead Implant Date: 20070706
Implantable Lead Implant Date: 20070706
Implantable Lead Location: 753859
Implantable Lead Location: 753860
Implantable Lead Model: 5076
Implantable Lead Model: 5092
Implantable Pulse Generator Implant Date: 20170113
Lead Channel Impedance Value: 417 Ohm
Lead Channel Impedance Value: 760 Ohm
Lead Channel Pacing Threshold Amplitude: 0.875 V
Lead Channel Pacing Threshold Amplitude: 1.75 V
Lead Channel Pacing Threshold Pulse Width: 0.4 ms
Lead Channel Pacing Threshold Pulse Width: 0.4 ms
Lead Channel Setting Pacing Amplitude: 1.75 V
Lead Channel Setting Pacing Amplitude: 3.5 V
Lead Channel Setting Pacing Pulse Width: 0.4 ms
Lead Channel Setting Sensing Sensitivity: 5.6 mV
Zone Setting Status: 755011
Zone Setting Status: 755011

## 2023-11-20 DIAGNOSIS — H401234 Low-tension glaucoma, bilateral, indeterminate stage: Secondary | ICD-10-CM | POA: Diagnosis not present

## 2023-11-20 DIAGNOSIS — H34811 Central retinal vein occlusion, right eye, with macular edema: Secondary | ICD-10-CM | POA: Diagnosis not present

## 2023-11-20 DIAGNOSIS — Z961 Presence of intraocular lens: Secondary | ICD-10-CM | POA: Diagnosis not present

## 2023-11-20 DIAGNOSIS — H35372 Puckering of macula, left eye: Secondary | ICD-10-CM | POA: Diagnosis not present

## 2023-11-20 DIAGNOSIS — E1151 Type 2 diabetes mellitus with diabetic peripheral angiopathy without gangrene: Secondary | ICD-10-CM | POA: Diagnosis not present

## 2023-11-20 DIAGNOSIS — H35352 Cystoid macular degeneration, left eye: Secondary | ICD-10-CM | POA: Diagnosis not present

## 2023-11-22 DIAGNOSIS — R338 Other retention of urine: Secondary | ICD-10-CM | POA: Diagnosis not present

## 2023-11-26 DIAGNOSIS — L570 Actinic keratosis: Secondary | ICD-10-CM | POA: Diagnosis not present

## 2023-11-26 DIAGNOSIS — D0439 Carcinoma in situ of skin of other parts of face: Secondary | ICD-10-CM | POA: Diagnosis not present

## 2023-11-26 DIAGNOSIS — D485 Neoplasm of uncertain behavior of skin: Secondary | ICD-10-CM | POA: Diagnosis not present

## 2023-12-05 DIAGNOSIS — R338 Other retention of urine: Secondary | ICD-10-CM | POA: Diagnosis not present

## 2023-12-05 DIAGNOSIS — N401 Enlarged prostate with lower urinary tract symptoms: Secondary | ICD-10-CM | POA: Diagnosis not present

## 2023-12-24 DIAGNOSIS — R338 Other retention of urine: Secondary | ICD-10-CM | POA: Diagnosis not present

## 2023-12-26 DIAGNOSIS — D0439 Carcinoma in situ of skin of other parts of face: Secondary | ICD-10-CM | POA: Diagnosis not present

## 2023-12-26 DIAGNOSIS — L570 Actinic keratosis: Secondary | ICD-10-CM | POA: Diagnosis not present

## 2023-12-31 ENCOUNTER — Ambulatory Visit (INDEPENDENT_AMBULATORY_CARE_PROVIDER_SITE_OTHER)

## 2023-12-31 ENCOUNTER — Ambulatory Visit: Admitting: Podiatry

## 2023-12-31 VITALS — Ht 67.0 in | Wt 157.6 lb

## 2023-12-31 DIAGNOSIS — L97502 Non-pressure chronic ulcer of other part of unspecified foot with fat layer exposed: Secondary | ICD-10-CM | POA: Diagnosis not present

## 2023-12-31 DIAGNOSIS — E119 Type 2 diabetes mellitus without complications: Secondary | ICD-10-CM

## 2023-12-31 DIAGNOSIS — E10621 Type 1 diabetes mellitus with foot ulcer: Secondary | ICD-10-CM | POA: Diagnosis not present

## 2023-12-31 DIAGNOSIS — M79672 Pain in left foot: Secondary | ICD-10-CM | POA: Diagnosis not present

## 2023-12-31 DIAGNOSIS — L97512 Non-pressure chronic ulcer of other part of right foot with fat layer exposed: Secondary | ICD-10-CM | POA: Diagnosis not present

## 2023-12-31 NOTE — Progress Notes (Signed)
 Chief Complaint  Patient presents with   Foot Problem    Rm 7 Patient is here for peeling skin on left hallux. Patient also states tingling/numbing sensation in both feet with intermittent swelling.    HPI: 83 y.o. male presenting today as a new patient for evaluation of intermittent occasional numbness and tingling to the bilateral feet.  No pain.  He ambulates just fine.  Referred by his PCP.  Past Medical History:  Diagnosis Date   Anemia    Anxiety    Arthritis    all over (11/25/2015)   CAD (coronary artery disease)    a. s/p CABG  06/2002; b. 02/01/15 PCI: DES to prox SVG to PDA, staged PCI of SVG to Diag in 02/2015; c. 04/2017 Cath/PCI: LM nl, LAD 100ost, 100m, 75d, LCX 60ost, OM2 80, RCA 100ost, RPDA 80, LIMA->LAD ok, VG->D1 patent stent, VG->RPDA patent stent, 50p, VG->OM1->OM2 90p (3.0x24 Synergy DES), 100 between OM1->OM2 (med rx).   Cancer [facility])    Right Shoulder, Left Leg- BCC, SCC, AND MELANOMA   Colon polyp    Elevated cholesterol    Epithelioid hemangioendothelioma    s/p resection of left SFA/mass with interposition 6 mm GoreTex graft 06/02/10, s/p repeat resection for positive margins 09/22/10   High blood pressure    Hyperlipidemia    Hypertension    Leg pain    OSA on CPAP    PAD (peripheral artery disease) (HCC)    a. 10/2002 L SFA PTA/BMS; b. 8/17 LE Angio: LEIA 90 (9x40 self exp stent), LSFA short segment prox occlusion (staged PTA/stenting 01/03/2016), patent mid stent, RSFA 27m (staged PTA/DEB 02/14/2016).   Presence of permanent cardiac pacemaker    Medtronic   Renal insufficiency 12/18/2022   SSS (sick sinus syndrome) (HCC)    a. s/p PPM in 2007 with gen change 04/2015 - Medtronic Adapta ADDRL1, ser # [id number].   Type II diabetes mellitus (HCC)    Type II   Urgency of urination     Past Surgical History:  Procedure Laterality Date   CARDIAC CATHETERIZATION N/A 02/01/2015   Procedure: Left Heart Cath and Coronary Angiography;  Surgeon: Dorn JINNY Lesches,  MD;  Location: [facility] INVASIVE CV LAB;  Service: Cardiovascular;  Laterality: N/A;   CARDIAC CATHETERIZATION N/A 02/01/2015   Procedure: Coronary Stent Intervention;  Surgeon: Dorn JINNY Lesches, MD;  Location: [facility] INVASIVE CV LAB;  Service: Cardiovascular;  Laterality: N/A;   CARDIAC CATHETERIZATION  06/2002   just before bypass OR   CARDIAC CATHETERIZATION N/A 03/01/2015   Procedure: Coronary Stent Intervention;  Surgeon: Dorn JINNY Lesches, MD;  Location: [facility] INVASIVE CV LAB;  Service: Cardiovascular;  Laterality: N/A;   CARDIAC CATHETERIZATION  02/06/2018   COLONOSCOPY     CORONARY ANGIOPLASTY     CORONARY ARTERY BYPASS GRAFT  06/2002   x5, LIMA-LAD;VG- Diag; seq VG- ramus & OM branch; VG-PDA   CORONARY STENT INTERVENTION N/A 04/12/2017   Procedure: CORONARY STENT INTERVENTION;  Surgeon: Lesches Dorn JINNY, MD;  Location: [facility] INVASIVE CV LAB;  Service: Cardiovascular;  Laterality: N/A;   CORONARY STENT INTERVENTION N/A 02/08/2018   Procedure: CORONARY STENT INTERVENTION;  Surgeon: Dann Candyce RAMAN, MD;  Location: [facility] INVASIVE CV LAB;  Service: Cardiovascular;  Laterality: N/A;   CORONARY STENT INTERVENTION N/A 11/12/2018   Procedure: CORONARY STENT INTERVENTION;  Surgeon: Darron Deatrice LABOR, MD;  Location: [facility] INVASIVE CV LAB;  Service: Cardiovascular;  Laterality: N/A;   CORONARY STENT INTERVENTION N/A 06/22/2020   Procedure: CORONARY STENT  INTERVENTION;  Surgeon: Dann Candyce RAMAN, MD;  Location: [facility] INVASIVE CV LAB;  Service: Cardiovascular;  Laterality: N/A;   EP IMPLANTABLE DEVICE N/A 04/23/2015   Procedure: PPM Generator Changeout;  Surgeon: Jerel Balding, MD;  Location: [facility] INVASIVE CV LAB;  Service: Cardiovascular;  Laterality: N/A;   FALSE ANEURYSM REPAIR Left 11/29/2018   Procedure: REPAIR FALSE ANEURYSM LEFT RADIAL ARTERY;  Surgeon: Oris Krystal FALCON, MD;  Location: [facility] OR;  Service: Vascular;  Laterality: Left;   FEMORAL ARTERY STENT Left ~ 2014   taken out of my leg; couldn' catorgorize what  kind so the put it under all 3; cataroziepitheloid hemanioendotheliomau   FOOT FRACTURE SURGERY Left 1973   FRACTURE SURGERY     INSERT / REPLACE / REMOVE PACEMAKER  10/13/05   right side, medtronic Adapta   IR 3D INDEPENDENT WKST  02/26/2023   IR ANGIOGRAM PELVIS SELECTIVE OR SUPRASELECTIVE  02/26/2023   IR ANGIOGRAM SELECTIVE EACH ADDITIONAL VESSEL  02/26/2023   IR ANGIOGRAM SELECTIVE EACH ADDITIONAL VESSEL  02/26/2023   IR EMBO TUMOR ORGAN ISCHEMIA INFARCT INC GUIDE ROADMAPPING  02/26/2023   IR RADIOLOGIST EVAL & MGMT  02/02/2023   IR US  GUIDE VASC ACCESS LEFT  02/26/2023   IR US  GUIDE VASC ACCESS LEFT  02/26/2023   IR US  GUIDE VASC ACCESS LEFT  02/26/2023   KNEE HARDWARE REMOVAL Right 1950's   3-4 months after the insertion   KNEE SURGERY Right 1950's   broke my lower leg; had to put pin in my knee to keep lower leg in place til it healed   LEFT HEART CATH AND CORS/GRAFTS ANGIOGRAPHY N/A 04/12/2017   Procedure: LEFT HEART CATH AND CORS/GRAFTS ANGIOGRAPHY;  Surgeon: Court Dorn PARAS, MD;  Location: [facility] INVASIVE CV LAB;  Service: Cardiovascular;  Laterality: N/A;   LEFT HEART CATH AND CORS/GRAFTS ANGIOGRAPHY N/A 02/06/2018   Procedure: LEFT HEART CATH AND CORS/GRAFTS ANGIOGRAPHY;  Surgeon: Burnard Debby LABOR, MD;  Location: [facility] INVASIVE CV LAB;  Service: Cardiovascular;  Laterality: N/A;   LEFT HEART CATH AND CORS/GRAFTS ANGIOGRAPHY N/A 11/12/2018   Procedure: LEFT HEART CATH AND CORS/GRAFTS ANGIOGRAPHY;  Surgeon: Darron Deatrice LABOR, MD;  Location: [facility] INVASIVE CV LAB;  Service: Cardiovascular;  Laterality: N/A;   LEFT HEART CATH AND CORS/GRAFTS ANGIOGRAPHY N/A 06/22/2020   Procedure: LEFT HEART CATH AND CORS/GRAFTS ANGIOGRAPHY;  Surgeon: Dann Candyce RAMAN, MD;  Location: [facility] INVASIVE CV LAB;  Service: Cardiovascular;  Laterality: N/A;   LEFT HEART CATH AND CORS/GRAFTS ANGIOGRAPHY N/A 12/01/2022   Procedure: LEFT HEART CATH AND CORS/GRAFTS ANGIOGRAPHY;  Surgeon: [place], Peter M, MD;  Location:  [facility] INVASIVE CV LAB;  Service: Cardiovascular;  Laterality: N/A;   PERIPHERAL VASCULAR CATHETERIZATION N/A 11/25/2015   Procedure: Lower Extremity Angiography;  Surgeon: Dorn PARAS Court, MD;  Location: [facility] INVASIVE CV LAB;  Service: Cardiovascular;  Laterality: N/A;   PERIPHERAL VASCULAR CATHETERIZATION Left 11/25/2015   Procedure: Peripheral Vascular Intervention;  Surgeon: Dorn PARAS Court, MD;  Location: [facility] INVASIVE CV LAB;  Service: Cardiovascular;  Laterality: Left;  external iliac   PERIPHERAL VASCULAR CATHETERIZATION N/A 01/03/2016   Procedure: Lower Extremity Angiography;  Surgeon: Dorn PARAS Court, MD;  Location: [facility] INVASIVE CV LAB;  Service: Cardiovascular;  Laterality: N/A;   PERIPHERAL VASCULAR CATHETERIZATION Left 01/03/2016   Procedure: Peripheral Vascular Intervention;  Surgeon: Dorn PARAS Court, MD;  Location: [facility] INVASIVE CV LAB;  Service: Cardiovascular;  Laterality: Left;  SFA   PERIPHERAL VASCULAR CATHETERIZATION Right 02/14/2016   Procedure: Peripheral Vascular Atherectomy;  Surgeon: Dorn JINNY Lesches, MD;  Location: [facility] INVASIVE CV LAB;  Service: Cardiovascular;  Laterality: Right;  SFA   POPLITEAL ARTERY STENT  01/03/2016   Contralateral access with a 7 [name] crossover sheath (second order catheter placement)   TONSILLECTOMY AND ADENOIDECTOMY     TUMOR EXCISION Right ~ 2005   cancerous tumor removed from shoulder   TUMOR EXCISION Right ~ 2000   benign tumor removed from under shoulder   TUMOR EXCISION Left 06/02/2010   resection of Lt SFA wth interposition of Gore-Tex graft    Allergies  Allergen Reactions   Peanut-Containing Drug Products Anaphylaxis and Other (See Comments)    Tongue swelling is severe    Ace Inhibitors Cough        Diltiazem Hcl Other (See Comments)    UNSPECIFIED REACTION     Meloxicam Other (See Comments)    GI upset    Doxycycline Other (See Comments) and Cough    Reaction of cough and runny nose   Eliquis  [Apixaban ] Other (See Comments)     dizziness   Sertraline  Hcl Other (See Comments)   Carvedilol Itching   Clonidine  Hcl Other (See Comments)    Patch only - skin irritation   Duloxetine Hcl Other (See Comments)    Urinary frequency     Physical Exam: General: The patient is alert and oriented x3 in no acute distress.  Dermatology: Skin is warm, dry and supple bilateral lower extremities.   Vascular: Palpable pedal pulses bilaterally. Capillary refill within normal limits.  No appreciable edema.  No erythema.  Neurological: Grossly intact via light touch  Musculoskeletal Exam: No pedal deformities noted   Assessment/Plan of Care: 1.  Encounter for diabetic foot exam  -Patient evaluated -No complicating features noted -Continue wearing good supportive tennis shoes and sneakers.  Refrain from going barefoot -Return to clinic annually       Thresa EMERSON Sar, DPM [facility]  Dr. Thresa EMERSON Sar, DPM    [address] [place], [place] [address]                Office [phone]  Fax [phone]

## 2024-01-01 ENCOUNTER — Other Ambulatory Visit: Payer: Self-pay

## 2024-01-02 MED ORDER — ISOSORBIDE MONONITRATE ER 60 MG PO TB24: 60.0000 mg | ORAL_TABLET | Freq: Every day | ORAL | 3 refills | Status: AC

## 2024-01-05 DIAGNOSIS — S50311A Abrasion of right elbow, initial encounter: Secondary | ICD-10-CM | POA: Diagnosis not present

## 2024-01-05 DIAGNOSIS — M542 Cervicalgia: Secondary | ICD-10-CM | POA: Diagnosis not present

## 2024-01-05 DIAGNOSIS — S2231XA Fracture of one rib, right side, initial encounter for closed fracture: Secondary | ICD-10-CM | POA: Diagnosis not present

## 2024-01-05 DIAGNOSIS — S270XXA Traumatic pneumothorax, initial encounter: Secondary | ICD-10-CM | POA: Diagnosis not present

## 2024-01-05 DIAGNOSIS — S3991XA Unspecified injury of abdomen, initial encounter: Secondary | ICD-10-CM | POA: Diagnosis not present

## 2024-01-05 DIAGNOSIS — I110 Hypertensive heart disease with heart failure: Secondary | ICD-10-CM | POA: Diagnosis not present

## 2024-01-05 DIAGNOSIS — J9811 Atelectasis: Secondary | ICD-10-CM | POA: Diagnosis not present

## 2024-01-05 DIAGNOSIS — R0781 Pleurodynia: Secondary | ICD-10-CM | POA: Diagnosis not present

## 2024-01-05 DIAGNOSIS — Z8669 Personal history of other diseases of the nervous system and sense organs: Secondary | ICD-10-CM | POA: Diagnosis not present

## 2024-01-05 DIAGNOSIS — I480 Paroxysmal atrial fibrillation: Secondary | ICD-10-CM | POA: Diagnosis not present

## 2024-01-05 DIAGNOSIS — R0789 Other chest pain: Secondary | ICD-10-CM | POA: Diagnosis not present

## 2024-01-05 DIAGNOSIS — Z8679 Personal history of other diseases of the circulatory system: Secondary | ICD-10-CM | POA: Diagnosis not present

## 2024-01-05 DIAGNOSIS — J90 Pleural effusion, not elsewhere classified: Secondary | ICD-10-CM | POA: Diagnosis not present

## 2024-01-05 DIAGNOSIS — R918 Other nonspecific abnormal finding of lung field: Secondary | ICD-10-CM | POA: Diagnosis not present

## 2024-01-05 DIAGNOSIS — I517 Cardiomegaly: Secondary | ICD-10-CM | POA: Diagnosis not present

## 2024-01-05 DIAGNOSIS — R071 Chest pain on breathing: Secondary | ICD-10-CM | POA: Diagnosis not present

## 2024-01-05 DIAGNOSIS — S2241XA Multiple fractures of ribs, right side, initial encounter for closed fracture: Secondary | ICD-10-CM | POA: Diagnosis not present

## 2024-01-05 DIAGNOSIS — N400 Enlarged prostate without lower urinary tract symptoms: Secondary | ICD-10-CM | POA: Diagnosis not present

## 2024-01-05 DIAGNOSIS — W19XXXA Unspecified fall, initial encounter: Secondary | ICD-10-CM | POA: Diagnosis not present

## 2024-01-05 DIAGNOSIS — R0989 Other specified symptoms and signs involving the circulatory and respiratory systems: Secondary | ICD-10-CM | POA: Diagnosis not present

## 2024-01-05 DIAGNOSIS — J984 Other disorders of lung: Secondary | ICD-10-CM | POA: Diagnosis not present

## 2024-01-05 DIAGNOSIS — I5022 Chronic systolic (congestive) heart failure: Secondary | ICD-10-CM | POA: Diagnosis not present

## 2024-01-05 DIAGNOSIS — J9600 Acute respiratory failure, unspecified whether with hypoxia or hypercapnia: Secondary | ICD-10-CM | POA: Diagnosis not present

## 2024-01-05 DIAGNOSIS — R0603 Acute respiratory distress: Secondary | ICD-10-CM | POA: Diagnosis not present

## 2024-01-05 DIAGNOSIS — K5900 Constipation, unspecified: Secondary | ICD-10-CM | POA: Diagnosis not present

## 2024-01-05 DIAGNOSIS — J189 Pneumonia, unspecified organism: Secondary | ICD-10-CM | POA: Diagnosis not present

## 2024-01-05 DIAGNOSIS — Z515 Encounter for palliative care: Secondary | ICD-10-CM | POA: Diagnosis not present

## 2024-01-05 DIAGNOSIS — Z7189 Other specified counseling: Secondary | ICD-10-CM | POA: Diagnosis not present

## 2024-01-05 DIAGNOSIS — I10 Essential (primary) hypertension: Secondary | ICD-10-CM | POA: Diagnosis not present

## 2024-01-05 DIAGNOSIS — R519 Headache, unspecified: Secondary | ICD-10-CM | POA: Diagnosis not present

## 2024-01-05 DIAGNOSIS — W1830XA Fall on same level, unspecified, initial encounter: Secondary | ICD-10-CM | POA: Diagnosis not present

## 2024-01-05 DIAGNOSIS — R0902 Hypoxemia: Secondary | ICD-10-CM | POA: Diagnosis not present

## 2024-01-05 DIAGNOSIS — J939 Pneumothorax, unspecified: Secondary | ICD-10-CM | POA: Diagnosis not present

## 2024-01-05 DIAGNOSIS — R5381 Other malaise: Secondary | ICD-10-CM | POA: Diagnosis not present

## 2024-01-05 DIAGNOSIS — R339 Retention of urine, unspecified: Secondary | ICD-10-CM | POA: Diagnosis not present

## 2024-01-05 DIAGNOSIS — M25521 Pain in right elbow: Secondary | ICD-10-CM | POA: Diagnosis not present

## 2024-01-06 DIAGNOSIS — I480 Paroxysmal atrial fibrillation: Secondary | ICD-10-CM | POA: Diagnosis not present

## 2024-01-06 DIAGNOSIS — N400 Enlarged prostate without lower urinary tract symptoms: Secondary | ICD-10-CM | POA: Diagnosis not present

## 2024-01-06 DIAGNOSIS — Z8669 Personal history of other diseases of the nervous system and sense organs: Secondary | ICD-10-CM | POA: Diagnosis not present

## 2024-01-06 DIAGNOSIS — W19XXXA Unspecified fall, initial encounter: Secondary | ICD-10-CM | POA: Diagnosis not present

## 2024-01-06 DIAGNOSIS — R918 Other nonspecific abnormal finding of lung field: Secondary | ICD-10-CM | POA: Diagnosis not present

## 2024-01-06 DIAGNOSIS — S2241XA Multiple fractures of ribs, right side, initial encounter for closed fracture: Secondary | ICD-10-CM | POA: Diagnosis not present

## 2024-01-06 DIAGNOSIS — I10 Essential (primary) hypertension: Secondary | ICD-10-CM | POA: Diagnosis not present

## 2024-01-06 DIAGNOSIS — I5022 Chronic systolic (congestive) heart failure: Secondary | ICD-10-CM | POA: Diagnosis not present

## 2024-01-06 DIAGNOSIS — Z8679 Personal history of other diseases of the circulatory system: Secondary | ICD-10-CM | POA: Diagnosis not present

## 2024-01-06 DIAGNOSIS — J939 Pneumothorax, unspecified: Secondary | ICD-10-CM | POA: Diagnosis not present

## 2024-01-07 DIAGNOSIS — R0902 Hypoxemia: Secondary | ICD-10-CM | POA: Diagnosis not present

## 2024-01-07 DIAGNOSIS — J939 Pneumothorax, unspecified: Secondary | ICD-10-CM | POA: Diagnosis not present

## 2024-01-07 DIAGNOSIS — R918 Other nonspecific abnormal finding of lung field: Secondary | ICD-10-CM | POA: Diagnosis not present

## 2024-01-07 DIAGNOSIS — J90 Pleural effusion, not elsewhere classified: Secondary | ICD-10-CM | POA: Diagnosis not present

## 2024-01-07 NOTE — Progress Notes (Signed)
 Remote PPM Transmission

## 2024-01-08 DIAGNOSIS — K5900 Constipation, unspecified: Secondary | ICD-10-CM | POA: Diagnosis not present

## 2024-01-08 DIAGNOSIS — R0781 Pleurodynia: Secondary | ICD-10-CM | POA: Diagnosis not present

## 2024-01-08 DIAGNOSIS — W19XXXA Unspecified fall, initial encounter: Secondary | ICD-10-CM | POA: Diagnosis not present

## 2024-01-08 DIAGNOSIS — R5381 Other malaise: Secondary | ICD-10-CM | POA: Diagnosis not present

## 2024-01-08 DIAGNOSIS — R0789 Other chest pain: Secondary | ICD-10-CM | POA: Diagnosis not present

## 2024-01-08 DIAGNOSIS — R339 Retention of urine, unspecified: Secondary | ICD-10-CM | POA: Diagnosis not present

## 2024-01-08 DIAGNOSIS — Z515 Encounter for palliative care: Secondary | ICD-10-CM | POA: Diagnosis not present

## 2024-01-08 DIAGNOSIS — Z7189 Other specified counseling: Secondary | ICD-10-CM | POA: Diagnosis not present

## 2024-01-08 DIAGNOSIS — J939 Pneumothorax, unspecified: Secondary | ICD-10-CM | POA: Diagnosis not present

## 2024-01-09 DIAGNOSIS — R918 Other nonspecific abnormal finding of lung field: Secondary | ICD-10-CM | POA: Diagnosis not present

## 2024-01-09 DIAGNOSIS — R0902 Hypoxemia: Secondary | ICD-10-CM | POA: Diagnosis not present

## 2024-01-09 DIAGNOSIS — J939 Pneumothorax, unspecified: Secondary | ICD-10-CM | POA: Diagnosis not present

## 2024-01-10 DIAGNOSIS — Z7189 Other specified counseling: Secondary | ICD-10-CM | POA: Diagnosis not present

## 2024-01-10 DIAGNOSIS — K5900 Constipation, unspecified: Secondary | ICD-10-CM | POA: Diagnosis not present

## 2024-01-10 DIAGNOSIS — J939 Pneumothorax, unspecified: Secondary | ICD-10-CM | POA: Diagnosis not present

## 2024-01-10 DIAGNOSIS — R918 Other nonspecific abnormal finding of lung field: Secondary | ICD-10-CM | POA: Diagnosis not present

## 2024-01-10 DIAGNOSIS — R339 Retention of urine, unspecified: Secondary | ICD-10-CM | POA: Diagnosis not present

## 2024-01-10 DIAGNOSIS — Z515 Encounter for palliative care: Secondary | ICD-10-CM | POA: Diagnosis not present

## 2024-01-10 DIAGNOSIS — R0902 Hypoxemia: Secondary | ICD-10-CM | POA: Diagnosis not present

## 2024-01-10 DIAGNOSIS — R0789 Other chest pain: Secondary | ICD-10-CM | POA: Diagnosis not present

## 2024-01-10 DIAGNOSIS — R5381 Other malaise: Secondary | ICD-10-CM | POA: Diagnosis not present

## 2024-01-10 DIAGNOSIS — W19XXXA Unspecified fall, initial encounter: Secondary | ICD-10-CM | POA: Diagnosis not present

## 2024-01-11 DIAGNOSIS — Z950 Presence of cardiac pacemaker: Secondary | ICD-10-CM | POA: Diagnosis not present

## 2024-01-11 DIAGNOSIS — R0781 Pleurodynia: Secondary | ICD-10-CM | POA: Diagnosis not present

## 2024-01-11 DIAGNOSIS — I2510 Atherosclerotic heart disease of native coronary artery without angina pectoris: Secondary | ICD-10-CM | POA: Diagnosis not present

## 2024-01-11 DIAGNOSIS — E119 Type 2 diabetes mellitus without complications: Secondary | ICD-10-CM | POA: Diagnosis not present

## 2024-01-11 DIAGNOSIS — Z466 Encounter for fitting and adjustment of urinary device: Secondary | ICD-10-CM | POA: Diagnosis not present

## 2024-01-11 DIAGNOSIS — I10 Essential (primary) hypertension: Secondary | ICD-10-CM | POA: Diagnosis not present

## 2024-01-11 DIAGNOSIS — N3001 Acute cystitis with hematuria: Secondary | ICD-10-CM | POA: Diagnosis not present

## 2024-01-11 DIAGNOSIS — M5459 Other low back pain: Secondary | ICD-10-CM | POA: Diagnosis not present

## 2024-01-11 DIAGNOSIS — S270XXA Traumatic pneumothorax, initial encounter: Secondary | ICD-10-CM | POA: Diagnosis not present

## 2024-01-11 DIAGNOSIS — S2241XA Multiple fractures of ribs, right side, initial encounter for closed fracture: Secondary | ICD-10-CM | POA: Diagnosis not present

## 2024-01-11 DIAGNOSIS — Z960 Presence of urogenital implants: Secondary | ICD-10-CM | POA: Diagnosis not present

## 2024-01-11 DIAGNOSIS — S270XXD Traumatic pneumothorax, subsequent encounter: Secondary | ICD-10-CM | POA: Diagnosis not present

## 2024-01-11 DIAGNOSIS — S2241XD Multiple fractures of ribs, right side, subsequent encounter for fracture with routine healing: Secondary | ICD-10-CM | POA: Diagnosis not present

## 2024-01-11 DIAGNOSIS — W19XXXA Unspecified fall, initial encounter: Secondary | ICD-10-CM | POA: Diagnosis not present

## 2024-01-11 DIAGNOSIS — J90 Pleural effusion, not elsewhere classified: Secondary | ICD-10-CM | POA: Diagnosis not present

## 2024-01-13 DIAGNOSIS — W19XXXA Unspecified fall, initial encounter: Secondary | ICD-10-CM | POA: Diagnosis not present

## 2024-01-13 DIAGNOSIS — S20211D Contusion of right front wall of thorax, subsequent encounter: Secondary | ICD-10-CM | POA: Diagnosis not present

## 2024-01-14 DIAGNOSIS — Z1330 Encounter for screening examination for mental health and behavioral disorders, unspecified: Secondary | ICD-10-CM | POA: Diagnosis not present

## 2024-01-14 DIAGNOSIS — G4733 Obstructive sleep apnea (adult) (pediatric): Secondary | ICD-10-CM | POA: Diagnosis not present

## 2024-01-16 DIAGNOSIS — N401 Enlarged prostate with lower urinary tract symptoms: Secondary | ICD-10-CM | POA: Diagnosis not present

## 2024-01-16 DIAGNOSIS — S2241XD Multiple fractures of ribs, right side, subsequent encounter for fracture with routine healing: Secondary | ICD-10-CM | POA: Diagnosis not present

## 2024-01-16 DIAGNOSIS — S270XXD Traumatic pneumothorax, subsequent encounter: Secondary | ICD-10-CM | POA: Diagnosis not present

## 2024-01-16 DIAGNOSIS — I110 Hypertensive heart disease with heart failure: Secondary | ICD-10-CM | POA: Diagnosis not present

## 2024-01-16 DIAGNOSIS — I5022 Chronic systolic (congestive) heart failure: Secondary | ICD-10-CM | POA: Diagnosis not present

## 2024-01-16 DIAGNOSIS — E119 Type 2 diabetes mellitus without complications: Secondary | ICD-10-CM | POA: Diagnosis not present

## 2024-01-16 DIAGNOSIS — R338 Other retention of urine: Secondary | ICD-10-CM | POA: Diagnosis not present

## 2024-01-16 DIAGNOSIS — I2510 Atherosclerotic heart disease of native coronary artery without angina pectoris: Secondary | ICD-10-CM | POA: Diagnosis not present

## 2024-01-16 DIAGNOSIS — I480 Paroxysmal atrial fibrillation: Secondary | ICD-10-CM | POA: Diagnosis not present

## 2024-01-22 DIAGNOSIS — I2510 Atherosclerotic heart disease of native coronary artery without angina pectoris: Secondary | ICD-10-CM | POA: Diagnosis not present

## 2024-01-22 DIAGNOSIS — I5022 Chronic systolic (congestive) heart failure: Secondary | ICD-10-CM | POA: Diagnosis not present

## 2024-01-22 DIAGNOSIS — I110 Hypertensive heart disease with heart failure: Secondary | ICD-10-CM | POA: Diagnosis not present

## 2024-01-22 DIAGNOSIS — I480 Paroxysmal atrial fibrillation: Secondary | ICD-10-CM | POA: Diagnosis not present

## 2024-01-23 ENCOUNTER — Inpatient Hospital Stay: Attending: Hematology and Oncology

## 2024-01-23 ENCOUNTER — Inpatient Hospital Stay: Admitting: Hematology and Oncology

## 2024-01-23 ENCOUNTER — Other Ambulatory Visit: Payer: Self-pay | Admitting: Hematology and Oncology

## 2024-01-23 VITALS — BP 118/62 | HR 85 | Temp 97.3°F | Resp 15 | Wt 161.1 lb

## 2024-01-23 DIAGNOSIS — C9110 Chronic lymphocytic leukemia of B-cell type not having achieved remission: Secondary | ICD-10-CM | POA: Diagnosis not present

## 2024-01-23 DIAGNOSIS — D500 Iron deficiency anemia secondary to blood loss (chronic): Secondary | ICD-10-CM | POA: Diagnosis not present

## 2024-01-23 DIAGNOSIS — D649 Anemia, unspecified: Secondary | ICD-10-CM | POA: Insufficient documentation

## 2024-01-23 LAB — CBC WITH DIFFERENTIAL (CANCER CENTER ONLY)
Abs Immature Granulocytes: 0.08 K/uL — ABNORMAL HIGH (ref 0.00–0.07)
Basophils Absolute: 0.1 K/uL (ref 0.0–0.1)
Basophils Relative: 0 %
Eosinophils Absolute: 0.6 K/uL — ABNORMAL HIGH (ref 0.0–0.5)
Eosinophils Relative: 2 %
HCT: 39.6 % (ref 39.0–52.0)
Hemoglobin: 12.8 g/dL — ABNORMAL LOW (ref 13.0–17.0)
Immature Granulocytes: 0 %
Lymphocytes Relative: 63 %
Lymphs Abs: 14.5 K/uL — ABNORMAL HIGH (ref 0.7–4.0)
MCH: 28.6 pg (ref 26.0–34.0)
MCHC: 32.3 g/dL (ref 30.0–36.0)
MCV: 88.6 fL (ref 80.0–100.0)
Monocytes Absolute: 0.7 K/uL (ref 0.1–1.0)
Monocytes Relative: 3 %
Neutro Abs: 7.6 K/uL (ref 1.7–7.7)
Neutrophils Relative %: 32 %
Platelet Count: 244 K/uL (ref 150–400)
RBC: 4.47 MIL/uL (ref 4.22–5.81)
RDW: 13.9 % (ref 11.5–15.5)
Smear Review: NORMAL
WBC Count: 23.6 K/uL — ABNORMAL HIGH (ref 4.0–10.5)
nRBC: 0.1 % (ref 0.0–0.2)

## 2024-01-23 LAB — CMP (CANCER CENTER ONLY)
ALT: 37 U/L (ref 0–44)
AST: 38 U/L (ref 15–41)
Albumin: 4 g/dL (ref 3.5–5.0)
Alkaline Phosphatase: 93 U/L (ref 38–126)
Anion gap: 7 (ref 5–15)
BUN: 21 mg/dL (ref 8–23)
CO2: 29 mmol/L (ref 22–32)
Calcium: 9.5 mg/dL (ref 8.9–10.3)
Chloride: 108 mmol/L (ref 98–111)
Creatinine: 1.09 mg/dL (ref 0.61–1.24)
GFR, Estimated: >60 mL/min (ref >60)
Glucose, Bld: 83 mg/dL (ref 70–99)
Potassium: 4 mmol/L (ref 3.5–5.1)
Sodium: 144 mmol/L (ref 135–145)
Total Bilirubin: 0.6 mg/dL (ref 0.0–1.2)
Total Protein: 6.4 g/dL — ABNORMAL LOW (ref 6.5–8.1)

## 2024-01-23 LAB — RETIC PANEL
Immature Retic Fract: 12.9 % (ref 2.3–15.9)
RBC.: 4.48 MIL/uL (ref 4.22–5.81)
Retic Count, Absolute: 86 K/uL (ref 19.0–186.0)
Retic Ct Pct: 1.9 % (ref 0.4–3.1)
Reticulocyte Hemoglobin: 32.3 pg (ref >27.9)

## 2024-01-23 LAB — IRON AND IRON BINDING CAPACITY (CC-WL,HP ONLY)
Iron: 50 ug/dL (ref 45–182)
Saturation Ratios: 16 % — ABNORMAL LOW (ref 17.9–39.5)
TIBC: 308 ug/dL (ref 250–450)
UIBC: 258 ug/dL (ref 117–376)

## 2024-01-23 LAB — FERRITIN: Ferritin: 179 ng/mL (ref 24–336)

## 2024-01-23 LAB — LACTATE DEHYDROGENASE: LDH: 177 U/L (ref 98–192)

## 2024-01-23 NOTE — Progress Notes (Signed)
 [facility] [phone]   [phone]  PROGRESS NOTE  Patient Care Team: Robert Trula SQUIBB, MD as PCP - General (Internal Medicine) Robert Headland, MD as PCP - Electrophysiology (Cardiology) Robert Dorn PARAS, MD as PCP - Cardiology (Cardiology) Robert Dorn PARAS, MD as Consulting Physician (Cardiology) [facility], Robert Garre, Robert Burns as Physician Assistant (Cardiology)  Hematological/Oncological History # CLL Rai Stage 0. Del 13 # Iron  Deficiency Anemia  1) 08/09/2021: Labs from PCP, Dr. Norleen Lewis: --WBC 18.6, Hgb 11.7, MCV 80.4, Plt 164, Lymph 12.40.  2)  08/24/2021: Establish care with [facility].  Flow cytometry results most consistent with CLL  Interval History:  Robert Burns 83 y.o. male with medical history significant for newly diagnosed CLL who presents for a follow up visit. The patient's last visit was on 04/13/2023. In the interim since the last visit he has had no major changes in his health.  On exam today Robert Burns reports he has been well overall in the interim since our last visit other than a fall that he had.  He unfortunately broke 4 ribs and punctured along approximately 2 weeks ago.  He was at [employer] and fell off the curb.  He does have some bruising on his left arm but feels like he is recovering well.  He reports his energy levels are good and he is currently working with physical therapy to improve his movement and his rib pain.  He notes his appetite levels have been okay.  He notes he is not currently having any lightheadedness, dizziness, or shortness of breath.  He denies any bumps or lumps concerning for lymphadenopathy.  He reports that he did have a little blood sugar this morning that was low and he got a little woozy.  It improved with some carbohydrates.  He notes otherwise he has been well and has no questions concerns or complaints today.  A full 10 point ROS is otherwise negative.   MEDICAL HISTORY:  Past Medical  History:  Diagnosis Date   Anemia    Anxiety    Arthritis    all over (11/25/2015)   CAD (coronary artery disease)    a. s/p CABG  06/2002; b. 02/01/15 PCI: DES to prox SVG to PDA, staged PCI of SVG to Diag in 02/2015; c. 04/2017 Cath/PCI: LM nl, LAD 100ost, 17m, 75d, LCX 60ost, OM2 80, RCA 100ost, RPDA 80, LIMA->LAD ok, VG->D1 patent stent, VG->RPDA patent stent, 50p, VG->OM1->OM2 90p (3.0x24 Synergy DES), 100 between OM1->OM2 (med rx).   [facility])    Right Shoulder, Left Leg- BCC, SCC, AND MELANOMA   Colon polyp    Elevated cholesterol    Epithelioid hemangioendothelioma    s/p resection of left SFA/mass with interposition 6 mm GoreTex graft 06/02/10, s/p repeat resection for positive margins 09/22/10   High blood pressure    Hyperlipidemia    Hypertension    Leg pain    OSA on CPAP    PAD (peripheral artery disease)    a. 10/2002 L SFA PTA/BMS; b. 8/17 LE Angio: LEIA 90 (9x40 self exp stent), LSFA short segment prox occlusion (staged PTA/stenting 01/03/2016), patent mid stent, RSFA 7m (staged PTA/DEB 02/14/2016).   Presence of permanent cardiac pacemaker    Medtronic   Renal insufficiency 12/18/2022   SSS (sick sinus syndrome) (HCC)    a. s/p PPM in 2007 with gen change 04/2015 - Medtronic Adapta ADDRL1, ser # [id number].   Type II diabetes mellitus (HCC)    Type  II   Urgency of urination     SURGICAL HISTORY: Past Surgical History:  Procedure Laterality Date   CARDIAC CATHETERIZATION N/A 02/01/2015   Procedure: Left Heart Cath and Coronary Angiography;  Surgeon: Dorn JINNY Lesches, MD;  Location: [facility] INVASIVE CV LAB;  Service: Cardiovascular;  Laterality: N/A;   CARDIAC CATHETERIZATION N/A 02/01/2015   Procedure: Coronary Stent Intervention;  Surgeon: Dorn JINNY Lesches, MD;  Location: [facility] INVASIVE CV LAB;  Service: Cardiovascular;  Laterality: N/A;   CARDIAC CATHETERIZATION  06/2002   just before bypass OR   CARDIAC CATHETERIZATION N/A 03/01/2015   Procedure: Coronary Stent  Intervention;  Surgeon: Dorn JINNY Lesches, MD;  Location: [facility] INVASIVE CV LAB;  Service: Cardiovascular;  Laterality: N/A;   CARDIAC CATHETERIZATION  02/06/2018   COLONOSCOPY     CORONARY ANGIOPLASTY     CORONARY ARTERY BYPASS GRAFT  06/2002   x5, LIMA-LAD;VG- Diag; seq VG- ramus & OM branch; VG-PDA   CORONARY STENT INTERVENTION N/A 04/12/2017   Procedure: CORONARY STENT INTERVENTION;  Surgeon: Lesches Dorn JINNY, MD;  Location: [facility] INVASIVE CV LAB;  Service: Cardiovascular;  Laterality: N/A;   CORONARY STENT INTERVENTION N/A 02/08/2018   Procedure: CORONARY STENT INTERVENTION;  Surgeon: Dann Candyce RAMAN, MD;  Location: [facility] INVASIVE CV LAB;  Service: Cardiovascular;  Laterality: N/A;   CORONARY STENT INTERVENTION N/A 11/12/2018   Procedure: CORONARY STENT INTERVENTION;  Surgeon: Darron Deatrice LABOR, MD;  Location: [facility] INVASIVE CV LAB;  Service: Cardiovascular;  Laterality: N/A;   CORONARY STENT INTERVENTION N/A 06/22/2020   Procedure: CORONARY STENT INTERVENTION;  Surgeon: Dann Candyce RAMAN, MD;  Location: [facility] INVASIVE CV LAB;  Service: Cardiovascular;  Laterality: N/A;   EP IMPLANTABLE DEVICE N/A 04/23/2015   Procedure: PPM Generator Changeout;  Surgeon: Jerel Balding, MD;  Location: [facility] INVASIVE CV LAB;  Service: Cardiovascular;  Laterality: N/A;   FALSE ANEURYSM REPAIR Left 11/29/2018   Procedure: REPAIR FALSE ANEURYSM LEFT RADIAL ARTERY;  Surgeon: Oris Krystal FALCON, MD;  Location: [facility] OR;  Service: Vascular;  Laterality: Left;   FEMORAL ARTERY STENT Left ~ 2014   taken out of my leg; couldn' catorgorize what kind so the put it under all 3; cataroziepitheloid hemanioendotheliomau   FOOT FRACTURE SURGERY Left 1973   FRACTURE SURGERY     INSERT / REPLACE / REMOVE PACEMAKER  10/13/05   right side, medtronic Adapta   IR 3D INDEPENDENT WKST  02/26/2023   IR ANGIOGRAM PELVIS SELECTIVE OR SUPRASELECTIVE  02/26/2023   IR ANGIOGRAM SELECTIVE EACH ADDITIONAL VESSEL  02/26/2023   IR ANGIOGRAM SELECTIVE EACH  ADDITIONAL VESSEL  02/26/2023   IR EMBO TUMOR ORGAN ISCHEMIA INFARCT INC GUIDE ROADMAPPING  02/26/2023   IR RADIOLOGIST EVAL & MGMT  02/02/2023   IR US  GUIDE VASC ACCESS LEFT  02/26/2023   IR US  GUIDE VASC ACCESS LEFT  02/26/2023   IR US  GUIDE VASC ACCESS LEFT  02/26/2023   KNEE HARDWARE REMOVAL Right 1950's   3-4 months after the insertion   KNEE SURGERY Right 1950's   broke my lower leg; had to put pin in my knee to keep lower leg in place til it healed   LEFT HEART CATH AND CORS/GRAFTS ANGIOGRAPHY N/A 04/12/2017   Procedure: LEFT HEART CATH AND CORS/GRAFTS ANGIOGRAPHY;  Surgeon: Lesches Dorn JINNY, MD;  Location: [facility] INVASIVE CV LAB;  Service: Cardiovascular;  Laterality: N/A;   LEFT HEART CATH AND CORS/GRAFTS ANGIOGRAPHY N/A 02/06/2018   Procedure: LEFT HEART CATH AND CORS/GRAFTS ANGIOGRAPHY;  Surgeon: Burnard Debby LABOR, MD;  Location: [facility] INVASIVE CV LAB;  Service: Cardiovascular;  Laterality: N/A;   LEFT HEART CATH AND CORS/GRAFTS ANGIOGRAPHY N/A 11/12/2018   Procedure: LEFT HEART CATH AND CORS/GRAFTS ANGIOGRAPHY;  Surgeon: Darron Deatrice LABOR, MD;  Location: [facility] INVASIVE CV LAB;  Service: Cardiovascular;  Laterality: N/A;   LEFT HEART CATH AND CORS/GRAFTS ANGIOGRAPHY N/A 06/22/2020   Procedure: LEFT HEART CATH AND CORS/GRAFTS ANGIOGRAPHY;  Surgeon: Dann Candyce RAMAN, MD;  Location: [facility] INVASIVE CV LAB;  Service: Cardiovascular;  Laterality: N/A;   LEFT HEART CATH AND CORS/GRAFTS ANGIOGRAPHY N/A 12/01/2022   Procedure: LEFT HEART CATH AND CORS/GRAFTS ANGIOGRAPHY;  Surgeon: Swazil[place], Peter M, MD;  Location: [facility] INVASIVE CV LAB;  Service: Cardiovascular;  Laterality: N/A;   PERIPHERAL VASCULAR CATHETERIZATION N/A 11/25/2015   Procedure: Lower Extremity Angiography;  Surgeon: Dorn JINNY Lesches, MD;  Location: [facility] INVASIVE CV LAB;  Service: Cardiovascular;  Laterality: N/A;   PERIPHERAL VASCULAR CATHETERIZATION Left 11/25/2015   Procedure: Peripheral Vascular Intervention;  Surgeon: Dorn JINNY Lesches, MD;   Location: [facility] INVASIVE CV LAB;  Service: Cardiovascular;  Laterality: Left;  external iliac   PERIPHERAL VASCULAR CATHETERIZATION N/A 01/03/2016   Procedure: Lower Extremity Angiography;  Surgeon: Dorn JINNY Lesches, MD;  Location: [facility] INVASIVE CV LAB;  Service: Cardiovascular;  Laterality: N/A;   PERIPHERAL VASCULAR CATHETERIZATION Left 01/03/2016   Procedure: Peripheral Vascular Intervention;  Surgeon: Dorn JINNY Lesches, MD;  Location: [facility] INVASIVE CV LAB;  Service: Cardiovascular;  Laterality: Left;  SFA   PERIPHERAL VASCULAR CATHETERIZATION Right 02/14/2016   Procedure: Peripheral Vascular Atherectomy;  Surgeon: Dorn JINNY Lesches, MD;  Location: [facility] INVASIVE CV LAB;  Service: Cardiovascular;  Laterality: Right;  SFA   POPLITEAL ARTERY STENT  01/03/2016   Contralateral access with a 7 [name] crossover sheath (second order catheter placement)   TONSILLECTOMY AND ADENOIDECTOMY     TUMOR EXCISION Right ~ 2005   cancerous tumor removed from shoulder   TUMOR EXCISION Right ~ 2000   benign tumor removed from under shoulder   TUMOR EXCISION Left 06/02/2010   resection of Lt SFA wth interposition of Gore-Tex graft    SOCIAL HISTORY: Social History   Socioeconomic History   Marital status: Married    Spouse name: Not on file   Number of children: 2   Years of education: Not on file   Highest education level: Not on file  Occupational History   Occupation: retired  Tobacco Use   Smoking status: Former    Types: Pipe    Quit date: 1973    Years since quitting: 52.8   Smokeless tobacco: Never   Tobacco comments:    quit smoking in 1973  Vaping Use   Vaping status: Never Used  Substance and Sexual Activity   Alcohol use: No   Drug use: No   Sexual activity: Not Currently  Other Topics Concern   Not on file  Social History Narrative   Not on file   Social Drivers of Health   Financial Resource Strain: Not on file  Food Insecurity: No Food Insecurity (01/07/2024)   Received from [facility]   Hunger Vital Sign    Within the past 12 months, you worried that your food would run out before you got the money to buy more.: Never true    Within the past 12 months, the food you bought just didn't last and you didn't have money to get more.: Never true  Transportation Needs: No Transportation Needs (01/08/2024)   Received from [facility]  PRAPARE - Transportation    In the past 12 months, has lack of transportation kept you from medical appointments or from getting medications?: No    In the past 12 months, has lack of transportation kept you from meetings, work, or from getting things needed for daily living?: No  Physical Activity: Not on file  Stress: No Stress Concern Present (01/07/2024)   Received from [facility] - Occupational Stress Questionnaire    Do you feel stress - tense, restless, nervous, or anxious, or unable to sleep at night because your mind is troubled all the time - these days?: Not at all  Social Connections: Socially Integrated (05/03/2023)   Social Connection and Isolation Panel    Frequency of Communication with Friends and Family: Twice a week    Frequency of Social Gatherings with Friends and Family: Twice a week    Attends Religious Services: 1 to 4 times per year    Active Member of [employer] or Organizations: Yes    Attends Banker Meetings: 1 to 4 times per year    Marital Status: Married  Catering manager Violence: Not At Risk (01/11/2024)   Received from [facility]   HITS    Over the last 12 months how often did your partner physically hurt you?: Never    Over the last 12 months how often did your partner insult you or talk down to you?: Never    Over the last 12 months how often did your partner threaten you with physical harm?: Never    Over the last 12 months how often did your partner scream or curse at you?: Never    FAMILY HISTORY: Family History  Problem Relation Age of  Onset   Coronary artery disease Mother    Heart attack Mother    Hypertension Mother    Heart disease Mother        Open  Heart surgery   Diabetes Father    Heart disease Father    Hyperlipidemia Father    Heart attack Father    Hypertension Sister    Diabetes Sister    Heart disease Sister    Liver disease Neg Hx    Esophageal cancer Neg Hx    Colon cancer Neg Hx     ALLERGIES:  is allergic to peanut-containing drug products, ace inhibitors, diltiazem hcl, meloxicam, doxycycline, eliquis  [apixaban ], sertraline  hcl, carvedilol, clonidine  hcl, and duloxetine hcl.  MEDICATIONS:  Current Outpatient Medications  Medication Sig Dispense Refill   amLODipine  (NORVASC ) 10 MG tablet Take 10 mg by mouth daily.     atorvastatin  (LIPITOR ) 80 MG tablet Take 1 tablet (80 mg total) by mouth daily at 6 PM. 90 tablet 1   Continuous Glucose Receiver (DEXCOM G7 RECEIVER) DEVI as directed.     Continuous Glucose Sensor (DEXCOM G7 SENSOR) MISC as directed.     dorzolamide -timolol  (COSOPT ) 2-0.5 % ophthalmic solution Place 1 drop into both eyes 2 (two) times daily.     finasteride  (PROSCAR ) 5 MG tablet Take 5 mg by mouth daily.     furosemide  (LASIX ) 20 MG tablet Take 1 tablet (20 mg total) by mouth 2 (two) times daily. 180 tablet 3   glipiZIDE  (GLUCOTROL ) 5 MG tablet Take 5 mg by mouth daily with breakfast.      isosorbide  mononitrate (IMDUR ) 60 MG 24 hr tablet Take 1 tablet (60 mg total) by mouth daily. 90 tablet 3   JARDIANCE  10 MG TABS  tablet TAKE 1 TABLET EVERY DAY 90 tablet 2   metFORMIN  (GLUCOPHAGE ) 1000 MG tablet Take 1 tablet (1,000 mg total) by mouth 2 (two) times daily with a meal. Restart on 3/18.     metoprolol  tartrate (LOPRESSOR ) 50 MG tablet Take 50 mg by mouth 2 (two) times daily.     Multiple Vitamin-Folic Acid  TABS Take 1 tablet by mouth daily.     nitroGLYCERIN  (NITROSTAT ) 0.4 MG SL tablet Place 1 tablet (0.4 mg total) under the tongue every 5 (five) minutes as needed for chest  pain. 25 tablet 3   ranolazine  (RANEXA ) 500 MG 12 hr tablet Take 500 mg by mouth 2 (two) times daily.     rivaroxaban  (XARELTO ) 20 MG TABS tablet Take 1 tablet (20 mg total) by mouth daily with supper. (Patient taking differently: Take 20 mg by mouth at bedtime.) 90 tablet 3   tamsulosin  (FLOMAX ) 0.4 MG CAPS capsule Take 0.4 mg by mouth daily after supper.     VITAMIN D  PO Take 1,000 Units by mouth in the morning and at bedtime.     No current facility-administered medications for this visit.    REVIEW OF SYSTEMS:   Constitutional: ( - ) fevers, ( - )  chills , ( - ) night sweats Eyes: ( - ) blurriness of vision, ( - ) double vision, ( - ) watery eyes Ears, nose, mouth, throat, and face: ( - ) mucositis, ( - ) sore throat Respiratory: ( - ) cough, ( - ) dyspnea, ( - ) wheezes Cardiovascular: ( - ) palpitation, ( - ) chest discomfort, ( - ) lower extremity swelling Gastrointestinal:  ( - ) nausea, ( - ) heartburn, ( - ) change in bowel habits Skin: ( - ) abnormal skin rashes Lymphatics: ( - ) new lymphadenopathy, ( - ) easy bruising Neurological: ( - ) numbness, ( - ) tingling, ( - ) new weaknesses Behavioral/Psych: ( - ) mood change, ( - ) new changes  All other systems were reviewed with the patient and are negative.  PHYSICAL EXAMINATION: ECOG PERFORMANCE STATUS: 0 - Asymptomatic  Vitals:   01/23/24 1057  BP: 118/62  Pulse: 85  Resp: 15  Temp: (!) 97.3 F (36.3 C)  SpO2: 98%        Filed Weights   01/23/24 1057  Weight: 161 lb 1.6 oz (73.1 kg)         GENERAL: Well-appearing elderly Caucasian male, alert, no distress and comfortable SKIN: skin color, texture, turgor are normal, no rashes or significant lesions EYES: conjunctiva are pink and non-injected, sclera clear NECK: supple, non-tender LYMPH:  no palpable lymphadenopathy in the cervical, axillary or inguinal LUNGS: clear to auscultation and percussion with normal breathing effort HEART: regular rate &  rhythm and no murmurs and no lower extremity edema ABDOMEN: No evidence of hepatosplenomegaly.  Soft, non-tender, non-distended, normal bowel sounds Musculoskeletal: no cyanosis of digits and no clubbing  PSYCH: alert & oriented x 3, fluent speech NEURO: no focal motor/sensory deficits  LABORATORY DATA:  I have reviewed the data as listed    Latest Ref Rng & Units 01/23/2024   10:09 AM 07/26/2023   10:22 AM 05/05/2023    7:14 AM  CBC  WBC 4.0 - 10.5 K/uL 23.6  14.7  13.6   Hemoglobin 13.0 - 17.0 g/dL 87.1  86.1  89.9   Hematocrit 39.0 - 52.0 % 39.6  43.4  32.1   Platelets 150 - 400 K/uL 244  142  134        Latest Ref Rng & Units 01/23/2024   10:09 AM 08/21/2023    4:27 PM 07/26/2023   10:22 AM  CMP  Glucose 70 - 99 mg/dL 83  844  824   BUN 8 - 23 mg/dL 21  36  23   Creatinine 0.61 - 1.24 mg/dL 8.90  8.67  8.98   Sodium 135 - 145 mmol/L 144  144  145   Potassium 3.5 - 5.1 mmol/L 4.0  4.3  4.4   Chloride 98 - 111 mmol/L 108  103  109   CO2 22 - 32 mmol/L 29  24  31    Calcium  8.9 - 10.3 mg/dL 9.5  9.6  89.8   Total Protein 6.5 - 8.1 g/dL 6.4   7.3   Total Bilirubin 0.0 - 1.2 mg/dL 0.6   0.6   Alkaline Phos 38 - 126 U/L 93   69   AST 15 - 41 U/L 38   19   ALT 0 - 44 U/L 37   26     RADIOGRAPHIC STUDIES: DG Foot Complete Left Result Date: 12/31/2023 Please see detailed radiograph report in office note.  DG Foot Complete Right Result Date: 12/31/2023 Please see detailed radiograph report in office note.   ASSESSMENT & PLAN Robert Burns 83 y.o. male with medical history significant for newly diagnosed CLL who presents for a follow up visit.  Previously we discussed the diagnosis of CLL and treatment options moving forward.  We discussed that this is a chronic condition with no definitive cure.  Additionally we discussed that treatment is reserved until certain criteria are met.  Typically treatment is started with rapid increase in lymphocytes, massively enlarged lymph  nodes, anemia, or thrombocytopenia.  The patient currently does not meet any criteria necessary to start treatment.  As such I would recommend continued close observation of his blood counts and symptoms.  The patient knows to call the clinic with any issues involving fevers, chills, sweats, sudden weight loss, or rapidly enlarging lymph node.  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   --Prognostic panel showed del 13 with positive ZAP 70 and negative IgVH --Labs today show white blood cell count 23.6, Hgb 12.8, MCV 88.6, Plt 244  --No indication for imaging at this time --No criteria met for beginning treatment.  Would recommend continued monitoring --Return to clinic in 6 months time with interval 63-month labs  # Anemia -- Appears microcytic, concern for iron  deficiency anemia as the etiology. --Iron  labs last showed ferritin of 103 with iron  sat of 17% --seen by urology and IR, planned for prostate artery embolization, but this was aborted during the procedure and only pelvic arteriography was performed. Unable to reach target artery.  No orders of the defined types were placed in this encounter.   All questions were answered. The patient knows to call the clinic with any problems, questions or concerns.  A total of more than 30 minutes were spent on this encounter with face-to-face time and non-face-to-face time, including preparing to see the patient, ordering tests and/or medications, counseling the patient and coordination of care as outlined above.   Norleen IVAR Kidney, MD Department of Hematology/Oncology [facility] Phone: [phone] Pager: [phone] Email: [email or web address]  01/23/2024 4:32 PM  Shermon CHRISTELLA Hearing BD, Catovsky D, Caligaris-Cappio F, Dighiero G, Dhner H, Hillmen P, Keating M,  [place] E, Chiorazzi N, Stilgenbauer S, Rai KR, [place], Eichhorst B, O'Brien S, Robak T,  Seymour JF, Kipps TJ. iwCLL guidelines for diagnosis, indications for treatment, response assessment, and supportive management of CLL. Blood. 2018 Jun 21;131(25):2745-2760.  Active disease should be clearly documented to initiate therapy. At least 1 of the following criteria should be met.  1) Evidence of progressive marrow failure as manifested by the development of, or worsening of, anemia and/or thrombocytopenia. Cutoff levels of Hb <10 g/dL or platelet counts <899  109/L are generally regarded as indication for treatment. However, in some patients, platelet counts <100  109/L may remain stable over a long period; this situation does not automatically require therapeutic intervention. 2) Massive (ie, >=6 cm below the left costal margin) or progressive or symptomatic splenomegaly. 3) Massive nodes (ie, >=10 cm in longest diameter) or progressive or symptomatic lymphadenopathy. 4) Progressive lymphocytosis with an increase of >=50% over a 4-month period, or lymphocyte doubling time (LDT) <6 months. LDT can be obtained by linear regression extrapolation of absolute lymphocyte counts obtained at intervals of 2 weeks over an observation period of 2 to 3 months; patients with initial blood lymphocyte counts <30  109/L may require a longer observation period to determine the LDT. Factors contributing to lymphocytosis other than CLL (eg, infections, steroid administration) should be excluded. 5) Autoimmune complications including anemia or thrombocytopenia poorly responsive to corticosteroids. 6) Symptomatic or functional extranodal involvement (eg, skin, kidney, lung, spine). Disease-related symptoms as defined by any of the following: Unintentional weight loss >=10% within the previous 6 months. Significant fatigue (ie, ECOG performance scale 2 or worse; cannot work or unable to perform usual activities). Fevers >=100.93F or 38.0C for 2 or more weeks without evidence of infection. Night sweats for  >=1 month without evidence of infection.

## 2024-01-24 DIAGNOSIS — E1142 Type 2 diabetes mellitus with diabetic polyneuropathy: Secondary | ICD-10-CM | POA: Diagnosis not present

## 2024-01-24 DIAGNOSIS — Z8709 Personal history of other diseases of the respiratory system: Secondary | ICD-10-CM | POA: Diagnosis not present

## 2024-01-24 DIAGNOSIS — Z2839 Other underimmunization status: Secondary | ICD-10-CM | POA: Diagnosis not present

## 2024-01-24 DIAGNOSIS — R590 Localized enlarged lymph nodes: Secondary | ICD-10-CM | POA: Diagnosis not present

## 2024-01-24 DIAGNOSIS — Z23 Encounter for immunization: Secondary | ICD-10-CM | POA: Diagnosis not present

## 2024-01-24 DIAGNOSIS — Z122 Encounter for screening for malignant neoplasm of respiratory organs: Secondary | ICD-10-CM | POA: Diagnosis not present

## 2024-01-24 DIAGNOSIS — R296 Repeated falls: Secondary | ICD-10-CM | POA: Diagnosis not present

## 2024-01-24 DIAGNOSIS — Z8781 Personal history of (healed) traumatic fracture: Secondary | ICD-10-CM | POA: Diagnosis not present

## 2024-01-24 DIAGNOSIS — Z515 Encounter for palliative care: Secondary | ICD-10-CM | POA: Diagnosis not present

## 2024-01-24 DIAGNOSIS — R0609 Other forms of dyspnea: Secondary | ICD-10-CM | POA: Diagnosis not present

## 2024-01-24 DIAGNOSIS — J939 Pneumothorax, unspecified: Secondary | ICD-10-CM | POA: Diagnosis not present

## 2024-01-31 DIAGNOSIS — I5022 Chronic systolic (congestive) heart failure: Secondary | ICD-10-CM | POA: Diagnosis not present

## 2024-01-31 DIAGNOSIS — I110 Hypertensive heart disease with heart failure: Secondary | ICD-10-CM | POA: Diagnosis not present

## 2024-01-31 DIAGNOSIS — S270XXD Traumatic pneumothorax, subsequent encounter: Secondary | ICD-10-CM | POA: Diagnosis not present

## 2024-01-31 DIAGNOSIS — N401 Enlarged prostate with lower urinary tract symptoms: Secondary | ICD-10-CM | POA: Diagnosis not present

## 2024-01-31 DIAGNOSIS — I2510 Atherosclerotic heart disease of native coronary artery without angina pectoris: Secondary | ICD-10-CM | POA: Diagnosis not present

## 2024-01-31 DIAGNOSIS — S2241XD Multiple fractures of ribs, right side, subsequent encounter for fracture with routine healing: Secondary | ICD-10-CM | POA: Diagnosis not present

## 2024-01-31 DIAGNOSIS — I480 Paroxysmal atrial fibrillation: Secondary | ICD-10-CM | POA: Diagnosis not present

## 2024-01-31 DIAGNOSIS — E119 Type 2 diabetes mellitus without complications: Secondary | ICD-10-CM | POA: Diagnosis not present

## 2024-01-31 DIAGNOSIS — R338 Other retention of urine: Secondary | ICD-10-CM | POA: Diagnosis not present

## 2024-02-07 DIAGNOSIS — R338 Other retention of urine: Secondary | ICD-10-CM | POA: Diagnosis not present

## 2024-02-08 DIAGNOSIS — N400 Enlarged prostate without lower urinary tract symptoms: Secondary | ICD-10-CM | POA: Diagnosis not present

## 2024-02-08 DIAGNOSIS — I130 Hypertensive heart and chronic kidney disease with heart failure and stage 1 through stage 4 chronic kidney disease, or unspecified chronic kidney disease: Secondary | ICD-10-CM | POA: Diagnosis not present

## 2024-02-08 DIAGNOSIS — C9510 Chronic leukemia of unspecified cell type not having achieved remission: Secondary | ICD-10-CM | POA: Diagnosis not present

## 2024-02-08 DIAGNOSIS — I509 Heart failure, unspecified: Secondary | ICD-10-CM | POA: Diagnosis not present

## 2024-02-08 DIAGNOSIS — Z85820 Personal history of malignant melanoma of skin: Secondary | ICD-10-CM | POA: Diagnosis not present

## 2024-02-08 DIAGNOSIS — Z7901 Long term (current) use of anticoagulants: Secondary | ICD-10-CM | POA: Diagnosis not present

## 2024-02-08 DIAGNOSIS — I25119 Atherosclerotic heart disease of native coronary artery with unspecified angina pectoris: Secondary | ICD-10-CM | POA: Diagnosis not present

## 2024-02-08 DIAGNOSIS — I4430 Unspecified atrioventricular block: Secondary | ICD-10-CM | POA: Diagnosis not present

## 2024-02-08 DIAGNOSIS — Z888 Allergy status to other drugs, medicaments and biological substances status: Secondary | ICD-10-CM | POA: Diagnosis not present

## 2024-02-08 DIAGNOSIS — H348192 Central retinal vein occlusion, unspecified eye, stable: Secondary | ICD-10-CM | POA: Diagnosis not present

## 2024-02-08 DIAGNOSIS — N1831 Chronic kidney disease, stage 3a: Secondary | ICD-10-CM | POA: Diagnosis not present

## 2024-02-08 DIAGNOSIS — E1151 Type 2 diabetes mellitus with diabetic peripheral angiopathy without gangrene: Secondary | ICD-10-CM | POA: Diagnosis not present

## 2024-02-08 DIAGNOSIS — Z8673 Personal history of transient ischemic attack (TIA), and cerebral infarction without residual deficits: Secondary | ICD-10-CM | POA: Diagnosis not present

## 2024-02-08 DIAGNOSIS — Z87891 Personal history of nicotine dependence: Secondary | ICD-10-CM | POA: Diagnosis not present

## 2024-02-08 DIAGNOSIS — H409 Unspecified glaucoma: Secondary | ICD-10-CM | POA: Diagnosis not present

## 2024-02-08 DIAGNOSIS — Z95820 Peripheral vascular angioplasty status with implants and grafts: Secondary | ICD-10-CM | POA: Diagnosis not present

## 2024-02-08 DIAGNOSIS — I495 Sick sinus syndrome: Secondary | ICD-10-CM | POA: Diagnosis not present

## 2024-02-08 DIAGNOSIS — D6869 Other thrombophilia: Secondary | ICD-10-CM | POA: Diagnosis not present

## 2024-02-08 DIAGNOSIS — E1159 Type 2 diabetes mellitus with other circulatory complications: Secondary | ICD-10-CM | POA: Diagnosis not present

## 2024-02-08 DIAGNOSIS — Z950 Presence of cardiac pacemaker: Secondary | ICD-10-CM | POA: Diagnosis not present

## 2024-02-08 DIAGNOSIS — I252 Old myocardial infarction: Secondary | ICD-10-CM | POA: Diagnosis not present

## 2024-02-08 DIAGNOSIS — Z87892 Personal history of anaphylaxis: Secondary | ICD-10-CM | POA: Diagnosis not present

## 2024-02-08 DIAGNOSIS — E1122 Type 2 diabetes mellitus with diabetic chronic kidney disease: Secondary | ICD-10-CM | POA: Diagnosis not present

## 2024-02-08 DIAGNOSIS — L409 Psoriasis, unspecified: Secondary | ICD-10-CM | POA: Diagnosis not present

## 2024-02-08 DIAGNOSIS — G4733 Obstructive sleep apnea (adult) (pediatric): Secondary | ICD-10-CM | POA: Diagnosis not present

## 2024-02-08 DIAGNOSIS — N529 Male erectile dysfunction, unspecified: Secondary | ICD-10-CM | POA: Diagnosis not present

## 2024-02-08 DIAGNOSIS — I4891 Unspecified atrial fibrillation: Secondary | ICD-10-CM | POA: Diagnosis not present

## 2024-02-08 DIAGNOSIS — Z833 Family history of diabetes mellitus: Secondary | ICD-10-CM | POA: Diagnosis not present

## 2024-02-11 DIAGNOSIS — I110 Hypertensive heart disease with heart failure: Secondary | ICD-10-CM | POA: Diagnosis not present

## 2024-02-12 ENCOUNTER — Ambulatory Visit: Payer: Medicare HMO

## 2024-02-12 DIAGNOSIS — I495 Sick sinus syndrome: Secondary | ICD-10-CM | POA: Diagnosis not present

## 2024-02-13 DIAGNOSIS — L2481 Irritant contact dermatitis due to metals: Secondary | ICD-10-CM | POA: Diagnosis not present

## 2024-02-13 DIAGNOSIS — L814 Other melanin hyperpigmentation: Secondary | ICD-10-CM | POA: Diagnosis not present

## 2024-02-13 DIAGNOSIS — I25709 Atherosclerosis of coronary artery bypass graft(s), unspecified, with unspecified angina pectoris: Secondary | ICD-10-CM | POA: Diagnosis not present

## 2024-02-13 DIAGNOSIS — Z0000 Encounter for general adult medical examination without abnormal findings: Secondary | ICD-10-CM | POA: Diagnosis not present

## 2024-02-13 DIAGNOSIS — Z23 Encounter for immunization: Secondary | ICD-10-CM | POA: Diagnosis not present

## 2024-02-13 DIAGNOSIS — D6869 Other thrombophilia: Secondary | ICD-10-CM | POA: Diagnosis not present

## 2024-02-13 DIAGNOSIS — L579 Skin changes due to chronic exposure to nonionizing radiation, unspecified: Secondary | ICD-10-CM | POA: Diagnosis not present

## 2024-02-13 DIAGNOSIS — N401 Enlarged prostate with lower urinary tract symptoms: Secondary | ICD-10-CM | POA: Diagnosis not present

## 2024-02-13 DIAGNOSIS — Z85828 Personal history of other malignant neoplasm of skin: Secondary | ICD-10-CM | POA: Diagnosis not present

## 2024-02-13 DIAGNOSIS — I480 Paroxysmal atrial fibrillation: Secondary | ICD-10-CM | POA: Diagnosis not present

## 2024-02-13 DIAGNOSIS — Z79899 Other long term (current) drug therapy: Secondary | ICD-10-CM | POA: Diagnosis not present

## 2024-02-13 DIAGNOSIS — I5022 Chronic systolic (congestive) heart failure: Secondary | ICD-10-CM | POA: Diagnosis not present

## 2024-02-13 DIAGNOSIS — D235 Other benign neoplasm of skin of trunk: Secondary | ICD-10-CM | POA: Diagnosis not present

## 2024-02-13 DIAGNOSIS — L570 Actinic keratosis: Secondary | ICD-10-CM | POA: Diagnosis not present

## 2024-02-13 DIAGNOSIS — C9110 Chronic lymphocytic leukemia of B-cell type not having achieved remission: Secondary | ICD-10-CM | POA: Diagnosis not present

## 2024-02-13 DIAGNOSIS — E1121 Type 2 diabetes mellitus with diabetic nephropathy: Secondary | ICD-10-CM | POA: Diagnosis not present

## 2024-02-13 DIAGNOSIS — Z978 Presence of other specified devices: Secondary | ICD-10-CM | POA: Diagnosis not present

## 2024-02-13 DIAGNOSIS — Z1389 Encounter for screening for other disorder: Secondary | ICD-10-CM | POA: Diagnosis not present

## 2024-02-13 DIAGNOSIS — L821 Other seborrheic keratosis: Secondary | ICD-10-CM | POA: Diagnosis not present

## 2024-02-14 ENCOUNTER — Ambulatory Visit: Payer: Self-pay | Admitting: Cardiovascular Disease

## 2024-02-14 LAB — CUP PACEART REMOTE DEVICE CHECK
Battery Impedance: 1463 Ohm
Battery Remaining Longevity: 51 mo
Battery Voltage: 2.77 V
Brady Statistic AP VP Percent: 4 %
Brady Statistic AP VS Percent: 93 %
Brady Statistic AS VP Percent: 0 %
Brady Statistic AS VS Percent: 4 %
Date Time Interrogation Session: 20251105220130
Implantable Lead Connection Status: 753985
Implantable Lead Connection Status: 753985
Implantable Lead Implant Date: 20070706
Implantable Lead Implant Date: 20070706
Implantable Lead Location: 753859
Implantable Lead Location: 753860
Implantable Lead Model: 5076
Implantable Lead Model: 5092
Implantable Pulse Generator Implant Date: 20170113
Lead Channel Impedance Value: 430 Ohm
Lead Channel Impedance Value: 788 Ohm
Lead Channel Pacing Threshold Amplitude: 0.625 V
Lead Channel Pacing Threshold Amplitude: 1.5 V
Lead Channel Pacing Threshold Pulse Width: 0.4 ms
Lead Channel Pacing Threshold Pulse Width: 0.4 ms
Lead Channel Setting Pacing Amplitude: 1.5 V
Lead Channel Setting Pacing Amplitude: 3 V
Lead Channel Setting Pacing Pulse Width: 0.4 ms
Lead Channel Setting Sensing Sensitivity: 5.6 mV
Zone Setting Status: 755011
Zone Setting Status: 755011

## 2024-02-15 DIAGNOSIS — N401 Enlarged prostate with lower urinary tract symptoms: Secondary | ICD-10-CM | POA: Diagnosis not present

## 2024-02-15 DIAGNOSIS — R338 Other retention of urine: Secondary | ICD-10-CM | POA: Diagnosis not present

## 2024-02-15 DIAGNOSIS — I110 Hypertensive heart disease with heart failure: Secondary | ICD-10-CM | POA: Diagnosis not present

## 2024-02-15 DIAGNOSIS — I2510 Atherosclerotic heart disease of native coronary artery without angina pectoris: Secondary | ICD-10-CM | POA: Diagnosis not present

## 2024-02-15 DIAGNOSIS — S2241XD Multiple fractures of ribs, right side, subsequent encounter for fracture with routine healing: Secondary | ICD-10-CM | POA: Diagnosis not present

## 2024-02-15 DIAGNOSIS — E119 Type 2 diabetes mellitus without complications: Secondary | ICD-10-CM | POA: Diagnosis not present

## 2024-02-15 DIAGNOSIS — I5022 Chronic systolic (congestive) heart failure: Secondary | ICD-10-CM | POA: Diagnosis not present

## 2024-02-15 DIAGNOSIS — S270XXD Traumatic pneumothorax, subsequent encounter: Secondary | ICD-10-CM | POA: Diagnosis not present

## 2024-02-15 DIAGNOSIS — I480 Paroxysmal atrial fibrillation: Secondary | ICD-10-CM | POA: Diagnosis not present

## 2024-02-15 NOTE — Progress Notes (Signed)
 Remote PPM Transmission

## 2024-02-19 DIAGNOSIS — I480 Paroxysmal atrial fibrillation: Secondary | ICD-10-CM | POA: Diagnosis not present

## 2024-02-19 DIAGNOSIS — I110 Hypertensive heart disease with heart failure: Secondary | ICD-10-CM | POA: Diagnosis not present

## 2024-02-19 DIAGNOSIS — S270XXD Traumatic pneumothorax, subsequent encounter: Secondary | ICD-10-CM | POA: Diagnosis not present

## 2024-02-19 DIAGNOSIS — I5022 Chronic systolic (congestive) heart failure: Secondary | ICD-10-CM | POA: Diagnosis not present

## 2024-02-19 DIAGNOSIS — S2241XD Multiple fractures of ribs, right side, subsequent encounter for fracture with routine healing: Secondary | ICD-10-CM | POA: Diagnosis not present

## 2024-02-19 DIAGNOSIS — I2510 Atherosclerotic heart disease of native coronary artery without angina pectoris: Secondary | ICD-10-CM | POA: Diagnosis not present

## 2024-02-19 DIAGNOSIS — N401 Enlarged prostate with lower urinary tract symptoms: Secondary | ICD-10-CM | POA: Diagnosis not present

## 2024-02-19 DIAGNOSIS — R338 Other retention of urine: Secondary | ICD-10-CM | POA: Diagnosis not present

## 2024-02-19 DIAGNOSIS — E119 Type 2 diabetes mellitus without complications: Secondary | ICD-10-CM | POA: Diagnosis not present

## 2024-02-20 ENCOUNTER — Other Ambulatory Visit: Payer: Self-pay | Admitting: Adult Health

## 2024-02-20 NOTE — Telephone Encounter (Signed)
 Prescription refill request for Xarelto  received.  Indication:afib Last office visit:7/25 Weight:73.1  kg Age:83 Scr:1.09  10/25 CrCl:51.23  ml/min  Prescription refilled

## 2024-03-04 DIAGNOSIS — S270XXD Traumatic pneumothorax, subsequent encounter: Secondary | ICD-10-CM | POA: Diagnosis not present

## 2024-03-04 DIAGNOSIS — E119 Type 2 diabetes mellitus without complications: Secondary | ICD-10-CM | POA: Diagnosis not present

## 2024-03-04 DIAGNOSIS — N401 Enlarged prostate with lower urinary tract symptoms: Secondary | ICD-10-CM | POA: Diagnosis not present

## 2024-03-04 DIAGNOSIS — I5022 Chronic systolic (congestive) heart failure: Secondary | ICD-10-CM | POA: Diagnosis not present

## 2024-03-04 DIAGNOSIS — S2241XD Multiple fractures of ribs, right side, subsequent encounter for fracture with routine healing: Secondary | ICD-10-CM | POA: Diagnosis not present

## 2024-03-04 DIAGNOSIS — R338 Other retention of urine: Secondary | ICD-10-CM | POA: Diagnosis not present

## 2024-03-04 DIAGNOSIS — I2510 Atherosclerotic heart disease of native coronary artery without angina pectoris: Secondary | ICD-10-CM | POA: Diagnosis not present

## 2024-03-04 DIAGNOSIS — I480 Paroxysmal atrial fibrillation: Secondary | ICD-10-CM | POA: Diagnosis not present

## 2024-03-11 DIAGNOSIS — E1151 Type 2 diabetes mellitus with diabetic peripheral angiopathy without gangrene: Secondary | ICD-10-CM | POA: Diagnosis not present

## 2024-03-11 DIAGNOSIS — I480 Paroxysmal atrial fibrillation: Secondary | ICD-10-CM | POA: Diagnosis not present

## 2024-03-11 DIAGNOSIS — H401234 Low-tension glaucoma, bilateral, indeterminate stage: Secondary | ICD-10-CM | POA: Diagnosis not present

## 2024-03-11 DIAGNOSIS — R338 Other retention of urine: Secondary | ICD-10-CM | POA: Diagnosis not present

## 2024-03-11 DIAGNOSIS — H35372 Puckering of macula, left eye: Secondary | ICD-10-CM | POA: Diagnosis not present

## 2024-03-11 DIAGNOSIS — S2241XD Multiple fractures of ribs, right side, subsequent encounter for fracture with routine healing: Secondary | ICD-10-CM | POA: Diagnosis not present

## 2024-03-11 DIAGNOSIS — I110 Hypertensive heart disease with heart failure: Secondary | ICD-10-CM | POA: Diagnosis not present

## 2024-03-11 DIAGNOSIS — Z961 Presence of intraocular lens: Secondary | ICD-10-CM | POA: Diagnosis not present

## 2024-03-11 DIAGNOSIS — S270XXD Traumatic pneumothorax, subsequent encounter: Secondary | ICD-10-CM | POA: Diagnosis not present

## 2024-03-11 DIAGNOSIS — I2510 Atherosclerotic heart disease of native coronary artery without angina pectoris: Secondary | ICD-10-CM | POA: Diagnosis not present

## 2024-03-11 DIAGNOSIS — N401 Enlarged prostate with lower urinary tract symptoms: Secondary | ICD-10-CM | POA: Diagnosis not present

## 2024-03-11 DIAGNOSIS — H35351 Cystoid macular degeneration, right eye: Secondary | ICD-10-CM | POA: Diagnosis not present

## 2024-03-11 DIAGNOSIS — E119 Type 2 diabetes mellitus without complications: Secondary | ICD-10-CM | POA: Diagnosis not present

## 2024-03-11 DIAGNOSIS — H34811 Central retinal vein occlusion, right eye, with macular edema: Secondary | ICD-10-CM | POA: Diagnosis not present

## 2024-03-11 DIAGNOSIS — I5022 Chronic systolic (congestive) heart failure: Secondary | ICD-10-CM | POA: Diagnosis not present

## 2024-03-12 ENCOUNTER — Other Ambulatory Visit: Payer: Self-pay | Admitting: Internal Medicine

## 2024-03-13 DIAGNOSIS — J939 Pneumothorax, unspecified: Secondary | ICD-10-CM | POA: Diagnosis not present

## 2024-04-01 ENCOUNTER — Encounter: Payer: Self-pay | Admitting: Cardiovascular Disease

## 2024-04-25 ENCOUNTER — Inpatient Hospital Stay: Attending: Hematology and Oncology

## 2024-04-25 ENCOUNTER — Other Ambulatory Visit: Payer: Self-pay | Admitting: Hematology and Oncology

## 2024-04-25 DIAGNOSIS — C9110 Chronic lymphocytic leukemia of B-cell type not having achieved remission: Secondary | ICD-10-CM

## 2024-05-13 ENCOUNTER — Ambulatory Visit: Payer: Medicare HMO

## 2024-05-15 ENCOUNTER — Ambulatory Visit: Payer: Self-pay | Admitting: Cardiovascular Disease

## 2024-05-15 LAB — CUP PACEART REMOTE DEVICE CHECK
Battery Impedance: 1548 Ohm
Battery Remaining Longevity: 41 mo
Battery Voltage: 2.76 V
Brady Statistic AP VP Percent: 4 %
Brady Statistic AP VS Percent: 92 %
Brady Statistic AS VP Percent: 0 %
Brady Statistic AS VS Percent: 4 %
Date Time Interrogation Session: 20260204223220
Implantable Lead Connection Status: 753985
Implantable Lead Connection Status: 753985
Implantable Lead Implant Date: 20070706
Implantable Lead Implant Date: 20070706
Implantable Lead Location: 753859
Implantable Lead Location: 753860
Implantable Lead Model: 5076
Implantable Lead Model: 5092
Implantable Pulse Generator Implant Date: 20170113
Lead Channel Impedance Value: 424 Ohm
Lead Channel Impedance Value: 766 Ohm
Lead Channel Pacing Threshold Amplitude: 1.25 V
Lead Channel Pacing Threshold Amplitude: 1.625 V
Lead Channel Pacing Threshold Pulse Width: 0.4 ms
Lead Channel Pacing Threshold Pulse Width: 0.4 ms
Lead Channel Setting Pacing Amplitude: 2.5 V
Lead Channel Setting Pacing Amplitude: 3.25 V
Lead Channel Setting Pacing Pulse Width: 0.4 ms
Lead Channel Setting Sensing Sensitivity: 5.6 mV
Zone Setting Status: 755011
Zone Setting Status: 755011

## 2024-08-01 ENCOUNTER — Inpatient Hospital Stay: Admitting: Hematology and Oncology

## 2024-08-01 ENCOUNTER — Inpatient Hospital Stay

## 2024-08-12 ENCOUNTER — Ambulatory Visit: Payer: Medicare HMO
